# Patient Record
Sex: Female | Born: 1935 | Race: White | Hispanic: No | Smoking: Never smoker
Health system: Southern US, Community
[De-identification: ages and names within clinical notes are randomized; demographics above are authoritative.]

## PROBLEM LIST (undated history)

## (undated) DIAGNOSIS — F419 Anxiety disorder, unspecified: Secondary | ICD-10-CM

## (undated) DIAGNOSIS — K635 Polyp of colon: Secondary | ICD-10-CM

## (undated) DIAGNOSIS — K56609 Unspecified intestinal obstruction, unspecified as to partial versus complete obstruction: Secondary | ICD-10-CM

## (undated) DIAGNOSIS — E119 Type 2 diabetes mellitus without complications: Secondary | ICD-10-CM

## (undated) DIAGNOSIS — I272 Pulmonary hypertension, unspecified: Secondary | ICD-10-CM

## (undated) DIAGNOSIS — I4891 Unspecified atrial fibrillation: Secondary | ICD-10-CM

## (undated) DIAGNOSIS — I10 Essential (primary) hypertension: Secondary | ICD-10-CM

## (undated) DIAGNOSIS — Z7901 Long term (current) use of anticoagulants: Secondary | ICD-10-CM

## (undated) DIAGNOSIS — R2689 Other abnormalities of gait and mobility: Secondary | ICD-10-CM

## (undated) DIAGNOSIS — E039 Hypothyroidism, unspecified: Secondary | ICD-10-CM

## (undated) DIAGNOSIS — M419 Scoliosis, unspecified: Secondary | ICD-10-CM

## (undated) DIAGNOSIS — Z8619 Personal history of other infectious and parasitic diseases: Secondary | ICD-10-CM

## (undated) DIAGNOSIS — R1310 Dysphagia, unspecified: Secondary | ICD-10-CM

## (undated) DIAGNOSIS — K439 Ventral hernia without obstruction or gangrene: Secondary | ICD-10-CM

## (undated) DIAGNOSIS — E538 Deficiency of other specified B group vitamins: Secondary | ICD-10-CM

## (undated) DIAGNOSIS — C569 Malignant neoplasm of unspecified ovary: Secondary | ICD-10-CM

## (undated) DIAGNOSIS — I2510 Atherosclerotic heart disease of native coronary artery without angina pectoris: Secondary | ICD-10-CM

## (undated) DIAGNOSIS — G459 Transient cerebral ischemic attack, unspecified: Secondary | ICD-10-CM

## (undated) DIAGNOSIS — Q394 Esophageal web: Secondary | ICD-10-CM

## (undated) DIAGNOSIS — W19XXXA Unspecified fall, initial encounter: Secondary | ICD-10-CM

## (undated) DIAGNOSIS — K222 Esophageal obstruction: Secondary | ICD-10-CM

## (undated) DIAGNOSIS — I5032 Chronic diastolic (congestive) heart failure: Secondary | ICD-10-CM

## (undated) DIAGNOSIS — I517 Cardiomegaly: Secondary | ICD-10-CM

## (undated) DIAGNOSIS — I4819 Other persistent atrial fibrillation: Secondary | ICD-10-CM

## (undated) DIAGNOSIS — R296 Repeated falls: Secondary | ICD-10-CM

## (undated) DIAGNOSIS — L920 Granuloma annulare: Secondary | ICD-10-CM

## (undated) DIAGNOSIS — I890 Lymphedema, not elsewhere classified: Secondary | ICD-10-CM

## (undated) DIAGNOSIS — E785 Hyperlipidemia, unspecified: Secondary | ICD-10-CM

## (undated) DIAGNOSIS — N200 Calculus of kidney: Secondary | ICD-10-CM

## (undated) DIAGNOSIS — I872 Venous insufficiency (chronic) (peripheral): Secondary | ICD-10-CM

## (undated) DIAGNOSIS — I5030 Unspecified diastolic (congestive) heart failure: Secondary | ICD-10-CM

## (undated) DIAGNOSIS — R159 Full incontinence of feces: Secondary | ICD-10-CM

## (undated) DIAGNOSIS — R413 Other amnesia: Secondary | ICD-10-CM

## (undated) DIAGNOSIS — N390 Urinary tract infection, site not specified: Secondary | ICD-10-CM

## (undated) DIAGNOSIS — T7840XA Allergy, unspecified, initial encounter: Secondary | ICD-10-CM

## (undated) DIAGNOSIS — K580 Irritable bowel syndrome with diarrhea: Secondary | ICD-10-CM

## (undated) DIAGNOSIS — M8580 Other specified disorders of bone density and structure, unspecified site: Secondary | ICD-10-CM

## (undated) DIAGNOSIS — K589 Irritable bowel syndrome without diarrhea: Secondary | ICD-10-CM

## (undated) DIAGNOSIS — M51369 Other intervertebral disc degeneration, lumbar region without mention of lumbar back pain or lower extremity pain: Secondary | ICD-10-CM

## (undated) DIAGNOSIS — I700 Atherosclerosis of aorta: Secondary | ICD-10-CM

## (undated) DIAGNOSIS — G4700 Insomnia, unspecified: Secondary | ICD-10-CM

## (undated) DIAGNOSIS — Z8659 Personal history of other mental and behavioral disorders: Secondary | ICD-10-CM

## (undated) DIAGNOSIS — K219 Gastro-esophageal reflux disease without esophagitis: Secondary | ICD-10-CM

## (undated) HISTORY — PX: JOINT REPLACEMENT: SHX530

## (undated) HISTORY — PX: CARPAL TUNNEL RELEASE: SHX101

## (undated) HISTORY — DX: Irritable bowel syndrome, unspecified: K58.9

## (undated) HISTORY — DX: Chronic diastolic (congestive) heart failure: I50.32

## (undated) HISTORY — DX: Transient cerebral ischemic attack, unspecified: G45.9

## (undated) HISTORY — DX: Gastro-esophageal reflux disease without esophagitis: K21.9

## (undated) HISTORY — DX: Unspecified atrial fibrillation: I48.91

## (undated) HISTORY — DX: Venous insufficiency (chronic) (peripheral): I87.2

## (undated) HISTORY — DX: Granuloma annulare: L92.0

## (undated) HISTORY — PX: APPENDECTOMY: SHX54

## (undated) HISTORY — DX: Polyp of colon: K63.5

## (undated) HISTORY — DX: Essential (primary) hypertension: I10

## (undated) HISTORY — DX: Unspecified intestinal obstruction, unspecified as to partial versus complete obstruction: K56.609

## (undated) HISTORY — DX: Personal history of other mental and behavioral disorders: Z86.59

## (undated) HISTORY — DX: Malignant neoplasm of unspecified ovary: C56.9

## (undated) HISTORY — PX: MOUTH SURGERY: SHX715

## (undated) HISTORY — PX: LUMBAR LAMINECTOMY: SHX95

## (undated) HISTORY — PX: TOTAL SHOULDER ARTHROPLASTY: SHX126

## (undated) HISTORY — DX: Unspecified fall, initial encounter: W19.XXXA

## (undated) HISTORY — DX: Repeated falls: R29.6

## (undated) HISTORY — DX: Anxiety disorder, unspecified: F41.9

## (undated) HISTORY — DX: Personal history of other infectious and parasitic diseases: Z86.19

## (undated) HISTORY — DX: Hyperlipidemia, unspecified: E78.5

## (undated) HISTORY — DX: Atherosclerotic heart disease of native coronary artery without angina pectoris: I25.10

## (undated) HISTORY — DX: Other persistent atrial fibrillation: I48.19

## (undated) HISTORY — PX: OTHER SURGICAL HISTORY: SHX169

## (undated) HISTORY — PX: ABDOMINAL HYSTERECTOMY: SHX81

## (undated) HISTORY — DX: Type 2 diabetes mellitus without complications: E11.9

## (undated) HISTORY — DX: Allergy, unspecified, initial encounter: T78.40XA

## (undated) HISTORY — DX: Hypothyroidism, unspecified: E03.9

## (undated) HISTORY — DX: Urinary tract infection, site not specified: N39.0

## (undated) HISTORY — PX: BACK SURGERY: SHX140

---
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
## 1967-04-25 HISTORY — PX: OTHER SURGICAL HISTORY: SHX169

## 1969-04-24 HISTORY — PX: OTHER SURGICAL HISTORY: SHX169

## 1978-04-24 DIAGNOSIS — C569 Malignant neoplasm of unspecified ovary: Secondary | ICD-10-CM

## 1978-04-24 HISTORY — PX: TOTAL ABDOMINAL HYSTERECTOMY W/ BILATERAL SALPINGOOPHORECTOMY: SHX83

## 1978-04-24 HISTORY — DX: Malignant neoplasm of unspecified ovary: C56.9

## 1980-04-24 HISTORY — PX: OTHER SURGICAL HISTORY: SHX169

## 1984-04-24 HISTORY — PX: CHOLECYSTECTOMY: SHX55

## 1989-04-24 HISTORY — PX: OTHER SURGICAL HISTORY: SHX169

## 1996-04-24 DIAGNOSIS — K56609 Unspecified intestinal obstruction, unspecified as to partial versus complete obstruction: Secondary | ICD-10-CM

## 1996-04-24 HISTORY — DX: Unspecified intestinal obstruction, unspecified as to partial versus complete obstruction: K56.609

## 1997-03-24 HISTORY — PX: OTHER SURGICAL HISTORY: SHX169

## 1998-04-24 HISTORY — PX: OTHER SURGICAL HISTORY: SHX169

## 2000-04-24 HISTORY — PX: OTHER SURGICAL HISTORY: SHX169

## 2003-04-25 DIAGNOSIS — I4819 Other persistent atrial fibrillation: Secondary | ICD-10-CM

## 2003-04-25 HISTORY — DX: Other persistent atrial fibrillation: I48.19

## 2003-11-20 ENCOUNTER — Other Ambulatory Visit: Payer: Self-pay

## 2003-11-21 ENCOUNTER — Other Ambulatory Visit: Payer: Self-pay

## 2003-11-22 ENCOUNTER — Other Ambulatory Visit: Payer: Self-pay

## 2003-11-23 ENCOUNTER — Other Ambulatory Visit: Payer: Self-pay

## 2003-11-23 HISTORY — PX: OTHER SURGICAL HISTORY: SHX169

## 2003-11-23 HISTORY — PX: CARDIAC CATHETERIZATION: SHX172

## 2003-11-24 ENCOUNTER — Other Ambulatory Visit: Payer: Self-pay

## 2003-11-25 ENCOUNTER — Other Ambulatory Visit: Payer: Self-pay

## 2003-11-29 ENCOUNTER — Other Ambulatory Visit: Payer: Self-pay

## 2003-12-10 ENCOUNTER — Other Ambulatory Visit: Payer: Self-pay

## 2003-12-11 ENCOUNTER — Other Ambulatory Visit: Payer: Self-pay

## 2004-01-13 ENCOUNTER — Encounter: Payer: Self-pay | Admitting: Internal Medicine

## 2004-02-10 ENCOUNTER — Encounter: Payer: Self-pay | Admitting: Otolaryngology

## 2004-02-15 ENCOUNTER — Ambulatory Visit: Payer: Self-pay | Admitting: Gynecologic Oncology

## 2004-02-23 ENCOUNTER — Encounter: Payer: Self-pay | Admitting: Otolaryngology

## 2004-02-23 HISTORY — PX: WRIST FRACTURE SURGERY: SHX121

## 2004-03-23 ENCOUNTER — Emergency Department (HOSPITAL_COMMUNITY): Admission: EM | Admit: 2004-03-23 | Discharge: 2004-03-23 | Payer: Self-pay | Admitting: Emergency Medicine

## 2004-03-24 ENCOUNTER — Encounter: Payer: Self-pay | Admitting: Otolaryngology

## 2004-04-24 HISTORY — PX: OTHER SURGICAL HISTORY: SHX169

## 2004-04-24 LAB — HM COLONOSCOPY: HM Colonoscopy: NORMAL

## 2004-05-16 ENCOUNTER — Ambulatory Visit: Payer: Self-pay | Admitting: Gastroenterology

## 2004-06-10 ENCOUNTER — Ambulatory Visit: Payer: Self-pay | Admitting: Gastroenterology

## 2004-06-30 ENCOUNTER — Ambulatory Visit: Payer: Self-pay | Admitting: Gastroenterology

## 2004-07-29 ENCOUNTER — Ambulatory Visit: Payer: Self-pay | Admitting: Internal Medicine

## 2004-08-11 ENCOUNTER — Encounter: Payer: Self-pay | Admitting: Internal Medicine

## 2004-08-31 ENCOUNTER — Ambulatory Visit: Payer: Self-pay | Admitting: Gastroenterology

## 2004-09-02 ENCOUNTER — Ambulatory Visit: Payer: Self-pay | Admitting: Internal Medicine

## 2004-09-29 ENCOUNTER — Ambulatory Visit: Payer: Self-pay | Admitting: Internal Medicine

## 2004-10-03 ENCOUNTER — Ambulatory Visit: Payer: Self-pay | Admitting: Internal Medicine

## 2004-10-10 ENCOUNTER — Ambulatory Visit: Payer: Self-pay | Admitting: Internal Medicine

## 2004-10-24 ENCOUNTER — Ambulatory Visit: Payer: Self-pay | Admitting: Internal Medicine

## 2004-11-08 ENCOUNTER — Ambulatory Visit: Payer: Self-pay | Admitting: Internal Medicine

## 2004-11-22 ENCOUNTER — Ambulatory Visit: Payer: Self-pay | Admitting: Internal Medicine

## 2004-12-02 ENCOUNTER — Ambulatory Visit: Payer: Self-pay | Admitting: Internal Medicine

## 2004-12-20 ENCOUNTER — Ambulatory Visit: Payer: Self-pay | Admitting: Internal Medicine

## 2005-01-17 ENCOUNTER — Ambulatory Visit: Payer: Self-pay | Admitting: Internal Medicine

## 2005-02-14 ENCOUNTER — Ambulatory Visit: Payer: Self-pay | Admitting: Internal Medicine

## 2005-03-14 ENCOUNTER — Ambulatory Visit: Payer: Self-pay | Admitting: Internal Medicine

## 2005-03-24 ENCOUNTER — Ambulatory Visit: Payer: Self-pay | Admitting: Internal Medicine

## 2005-04-07 ENCOUNTER — Ambulatory Visit: Payer: Self-pay | Admitting: Internal Medicine

## 2005-04-10 ENCOUNTER — Ambulatory Visit: Payer: Self-pay | Admitting: Internal Medicine

## 2005-04-11 ENCOUNTER — Ambulatory Visit: Payer: Self-pay | Admitting: Gynecologic Oncology

## 2005-04-20 ENCOUNTER — Ambulatory Visit: Payer: Self-pay | Admitting: Family Medicine

## 2005-04-28 ENCOUNTER — Ambulatory Visit: Payer: Self-pay | Admitting: Family Medicine

## 2005-05-05 ENCOUNTER — Ambulatory Visit: Payer: Self-pay | Admitting: Internal Medicine

## 2005-06-02 ENCOUNTER — Ambulatory Visit: Payer: Self-pay | Admitting: Internal Medicine

## 2005-07-03 ENCOUNTER — Ambulatory Visit: Payer: Self-pay | Admitting: Internal Medicine

## 2005-07-10 ENCOUNTER — Ambulatory Visit: Payer: Self-pay | Admitting: Internal Medicine

## 2005-07-25 ENCOUNTER — Ambulatory Visit: Payer: Self-pay | Admitting: Internal Medicine

## 2005-08-22 HISTORY — PX: OTHER SURGICAL HISTORY: SHX169

## 2005-08-22 HISTORY — PX: CATARACT EXTRACTION: SUR2

## 2005-08-23 ENCOUNTER — Ambulatory Visit: Payer: Self-pay | Admitting: Internal Medicine

## 2005-08-28 ENCOUNTER — Ambulatory Visit: Payer: Self-pay | Admitting: Internal Medicine

## 2005-09-06 ENCOUNTER — Ambulatory Visit: Payer: Self-pay | Admitting: Internal Medicine

## 2005-10-06 ENCOUNTER — Ambulatory Visit: Payer: Self-pay | Admitting: Internal Medicine

## 2005-10-10 ENCOUNTER — Ambulatory Visit: Payer: Self-pay | Admitting: Internal Medicine

## 2005-11-03 ENCOUNTER — Ambulatory Visit: Payer: Self-pay | Admitting: Internal Medicine

## 2005-11-27 ENCOUNTER — Ambulatory Visit: Payer: Self-pay | Admitting: Internal Medicine

## 2005-12-27 ENCOUNTER — Ambulatory Visit: Payer: Self-pay | Admitting: Internal Medicine

## 2006-01-01 ENCOUNTER — Ambulatory Visit: Payer: Self-pay | Admitting: Internal Medicine

## 2006-01-08 ENCOUNTER — Ambulatory Visit: Payer: Self-pay | Admitting: Internal Medicine

## 2006-01-22 ENCOUNTER — Ambulatory Visit: Payer: Self-pay | Admitting: Internal Medicine

## 2006-02-20 ENCOUNTER — Ambulatory Visit: Payer: Self-pay | Admitting: Internal Medicine

## 2006-02-22 ENCOUNTER — Encounter: Payer: Self-pay | Admitting: Internal Medicine

## 2006-02-22 LAB — CONVERTED CEMR LAB: Pap Smear: NORMAL

## 2006-03-08 ENCOUNTER — Ambulatory Visit: Payer: Self-pay | Admitting: Internal Medicine

## 2006-03-21 ENCOUNTER — Ambulatory Visit: Payer: Self-pay | Admitting: Internal Medicine

## 2006-04-04 ENCOUNTER — Ambulatory Visit: Payer: Self-pay | Admitting: Internal Medicine

## 2006-04-11 ENCOUNTER — Ambulatory Visit: Payer: Self-pay | Admitting: Gynecologic Oncology

## 2006-05-03 ENCOUNTER — Ambulatory Visit: Payer: Self-pay | Admitting: Internal Medicine

## 2006-05-31 ENCOUNTER — Ambulatory Visit: Payer: Self-pay | Admitting: Internal Medicine

## 2006-05-31 LAB — CONVERTED CEMR LAB
Creatinine,U: 104.6 mg/dL
Hgb A1c MFr Bld: 7.6 % — ABNORMAL HIGH (ref 4.6–6.0)
Microalb Creat Ratio: 7.6 mg/g (ref 0.0–30.0)
Microalb, Ur: 0.8 mg/dL (ref 0.0–1.9)

## 2006-06-28 ENCOUNTER — Ambulatory Visit: Payer: Self-pay | Admitting: Internal Medicine

## 2006-07-30 ENCOUNTER — Ambulatory Visit: Payer: Self-pay | Admitting: Internal Medicine

## 2006-08-27 ENCOUNTER — Ambulatory Visit: Payer: Self-pay | Admitting: Internal Medicine

## 2006-08-27 LAB — CONVERTED CEMR LAB
INR: 2.6
Prothrombin Time: 19.4 s

## 2006-09-22 ENCOUNTER — Ambulatory Visit: Payer: Self-pay | Admitting: Family Medicine

## 2006-09-22 ENCOUNTER — Encounter: Payer: Self-pay | Admitting: Internal Medicine

## 2006-09-25 ENCOUNTER — Ambulatory Visit: Payer: Self-pay | Admitting: Internal Medicine

## 2006-09-25 LAB — CONVERTED CEMR LAB
INR: 2.7
Prothrombin Time: 20.1 s

## 2006-10-30 ENCOUNTER — Ambulatory Visit: Payer: Self-pay | Admitting: Internal Medicine

## 2006-10-30 LAB — CONVERTED CEMR LAB
INR: 2.2
Prothrombin Time: 18.2 s

## 2006-11-20 ENCOUNTER — Encounter: Payer: Self-pay | Admitting: Internal Medicine

## 2006-11-20 DIAGNOSIS — I4891 Unspecified atrial fibrillation: Secondary | ICD-10-CM

## 2006-11-20 DIAGNOSIS — I4821 Permanent atrial fibrillation: Secondary | ICD-10-CM | POA: Insufficient documentation

## 2006-11-20 DIAGNOSIS — E039 Hypothyroidism, unspecified: Secondary | ICD-10-CM | POA: Insufficient documentation

## 2006-11-20 DIAGNOSIS — M810 Age-related osteoporosis without current pathological fracture: Secondary | ICD-10-CM | POA: Insufficient documentation

## 2006-11-20 DIAGNOSIS — E1149 Type 2 diabetes mellitus with other diabetic neurological complication: Secondary | ICD-10-CM | POA: Insufficient documentation

## 2006-11-20 DIAGNOSIS — I10 Essential (primary) hypertension: Secondary | ICD-10-CM | POA: Insufficient documentation

## 2006-11-20 DIAGNOSIS — C569 Malignant neoplasm of unspecified ovary: Secondary | ICD-10-CM | POA: Insufficient documentation

## 2006-11-20 DIAGNOSIS — E785 Hyperlipidemia, unspecified: Secondary | ICD-10-CM | POA: Insufficient documentation

## 2006-11-20 DIAGNOSIS — E1169 Type 2 diabetes mellitus with other specified complication: Secondary | ICD-10-CM | POA: Insufficient documentation

## 2006-11-20 DIAGNOSIS — F411 Generalized anxiety disorder: Secondary | ICD-10-CM | POA: Insufficient documentation

## 2006-12-03 ENCOUNTER — Ambulatory Visit: Payer: Self-pay | Admitting: Internal Medicine

## 2006-12-03 LAB — CONVERTED CEMR LAB
INR: 1.8
Prothrombin Time: 16.5 s

## 2006-12-05 ENCOUNTER — Encounter (INDEPENDENT_AMBULATORY_CARE_PROVIDER_SITE_OTHER): Payer: Self-pay | Admitting: *Deleted

## 2006-12-05 LAB — CONVERTED CEMR LAB
ALT: 16 units/L (ref 0–35)
Albumin: 3.5 g/dL (ref 3.5–5.2)
BUN: 17 mg/dL (ref 6–23)
CO2: 31 meq/L (ref 19–32)
Calcium: 8.8 mg/dL (ref 8.4–10.5)
Chloride: 99 meq/L (ref 96–112)
Cholesterol: 202 mg/dL (ref 0–200)
Creatinine, Ser: 0.7 mg/dL (ref 0.4–1.2)
Direct LDL: 134.1 mg/dL
Free T4: 0.9 ng/dL (ref 0.6–1.6)
GFR calc Af Amer: 104 mL/min
GFR calc non Af Amer: 86 mL/min
Glucose, Bld: 128 mg/dL — ABNORMAL HIGH (ref 70–99)
HDL: 41.4 mg/dL (ref >39.0)
Hgb A1c MFr Bld: 7.1 % — ABNORMAL HIGH (ref 4.6–6.0)
Phosphorus: 4 mg/dL (ref 2.3–4.6)
Potassium: 4.1 meq/L (ref 3.5–5.1)
Sodium: 139 meq/L (ref 135–145)
TSH: 0.93 microintl units/mL (ref 0.35–5.50)
Total CHOL/HDL Ratio: 4.9
Triglycerides: 148 mg/dL (ref 0–149)
VLDL: 30 mg/dL (ref 0–40)

## 2007-01-15 ENCOUNTER — Ambulatory Visit: Payer: Self-pay | Admitting: Internal Medicine

## 2007-01-15 LAB — CONVERTED CEMR LAB
INR: 2.7
Prothrombin Time: 20.1 s

## 2007-02-12 ENCOUNTER — Ambulatory Visit: Payer: Self-pay | Admitting: Internal Medicine

## 2007-02-12 LAB — CONVERTED CEMR LAB
INR: 2
Prothrombin Time: 17.2 s

## 2007-03-12 ENCOUNTER — Ambulatory Visit: Payer: Self-pay | Admitting: Internal Medicine

## 2007-03-12 LAB — CONVERTED CEMR LAB
INR: 1.8
Prothrombin Time: 16.5 s

## 2007-03-15 ENCOUNTER — Ambulatory Visit: Payer: Self-pay | Admitting: Internal Medicine

## 2007-03-15 DIAGNOSIS — K439 Ventral hernia without obstruction or gangrene: Secondary | ICD-10-CM | POA: Insufficient documentation

## 2007-04-10 ENCOUNTER — Ambulatory Visit: Payer: Self-pay | Admitting: Internal Medicine

## 2007-04-10 LAB — CONVERTED CEMR LAB
INR: 2.9
Prothrombin Time: 20.5 s

## 2007-05-08 ENCOUNTER — Ambulatory Visit: Payer: Self-pay | Admitting: Internal Medicine

## 2007-05-08 LAB — CONVERTED CEMR LAB
INR: 2.3
Prothrombin Time: 25.6 s

## 2007-05-09 ENCOUNTER — Encounter: Payer: Self-pay | Admitting: Internal Medicine

## 2007-05-10 LAB — CONVERTED CEMR LAB
INR: 2.3 — ABNORMAL HIGH (ref 0.0–1.5)
Prothrombin Time: 25.6 s — ABNORMAL HIGH (ref 11.6–15.2)

## 2007-05-31 ENCOUNTER — Ambulatory Visit: Payer: Self-pay | Admitting: Internal Medicine

## 2007-05-31 DIAGNOSIS — K5909 Other constipation: Secondary | ICD-10-CM | POA: Insufficient documentation

## 2007-06-04 LAB — CONVERTED CEMR LAB
ALT: 16 units/L (ref 0–35)
Albumin: 3.5 g/dL (ref 3.5–5.2)
BUN: 14 mg/dL (ref 6–23)
Basophils Absolute: 0 10*3/uL (ref 0.0–0.1)
Basophils Relative: 0.1 % (ref 0.0–1.0)
CO2: 30 meq/L (ref 19–32)
Calcium: 9.1 mg/dL (ref 8.4–10.5)
Chloride: 101 meq/L (ref 96–112)
Cholesterol: 208 mg/dL (ref 0–200)
Creatinine, Ser: 0.7 mg/dL (ref 0.4–1.2)
Creatinine,U: 50.9 mg/dL
Direct LDL: 136.2 mg/dL
Eosinophils Absolute: 0.2 10*3/uL (ref 0.0–0.6)
Eosinophils Relative: 3.8 % (ref 0.0–5.0)
GFR calc Af Amer: 104 mL/min
GFR calc non Af Amer: 86 mL/min
Glucose, Bld: 161 mg/dL — ABNORMAL HIGH (ref 70–99)
HCT: 41.9 % (ref 36.0–46.0)
HDL: 47.6 mg/dL (ref >39.0)
Hemoglobin: 14.3 g/dL (ref 12.0–15.0)
Hgb A1c MFr Bld: 8.3 % — ABNORMAL HIGH (ref 4.6–6.0)
Lymphocytes Relative: 26.6 % (ref 12.0–46.0)
MCHC: 34.1 g/dL (ref 30.0–36.0)
MCV: 97.5 fL (ref 78.0–100.0)
Microalb Creat Ratio: 13.8 mg/g (ref 0.0–30.0)
Microalb, Ur: 0.7 mg/dL (ref 0.0–1.9)
Monocytes Absolute: 0.4 10*3/uL (ref 0.2–0.7)
Monocytes Relative: 6.5 % (ref 3.0–11.0)
Neutro Abs: 3.7 10*3/uL (ref 1.4–7.7)
Neutrophils Relative %: 63 % (ref 43.0–77.0)
Phosphorus: 3.7 mg/dL (ref 2.3–4.6)
Platelets: 146 10*3/uL — ABNORMAL LOW (ref 150–400)
Potassium: 4.8 meq/L (ref 3.5–5.1)
RBC: 4.3 M/uL (ref 3.87–5.11)
RDW: 12.4 % (ref 11.5–14.6)
Sodium: 139 meq/L (ref 135–145)
TSH: 1.6 microintl units/mL (ref 0.35–5.50)
Total CHOL/HDL Ratio: 4.4
Triglycerides: 164 mg/dL — ABNORMAL HIGH (ref 0–149)
VLDL: 33 mg/dL (ref 0–40)
WBC: 5.9 10*3/uL (ref 4.5–10.5)

## 2007-06-28 ENCOUNTER — Ambulatory Visit: Payer: Self-pay | Admitting: Internal Medicine

## 2007-06-28 LAB — CONVERTED CEMR LAB
INR: 3.8
Prothrombin Time: 23.7 s

## 2007-07-12 ENCOUNTER — Ambulatory Visit: Payer: Self-pay | Admitting: Internal Medicine

## 2007-07-12 LAB — CONVERTED CEMR LAB
INR: 1.9
Prothrombin Time: 16.8 s

## 2007-07-15 ENCOUNTER — Ambulatory Visit: Payer: Self-pay | Admitting: Gynecologic Oncology

## 2007-07-15 ENCOUNTER — Encounter: Payer: Self-pay | Admitting: Internal Medicine

## 2007-07-22 ENCOUNTER — Ambulatory Visit: Payer: Self-pay | Admitting: Internal Medicine

## 2007-07-22 LAB — CONVERTED CEMR LAB
INR: 1.5
Prothrombin Time: 15.3 s

## 2007-08-05 ENCOUNTER — Encounter: Payer: Self-pay | Admitting: Internal Medicine

## 2007-08-06 ENCOUNTER — Ambulatory Visit: Payer: Self-pay | Admitting: Internal Medicine

## 2007-08-06 LAB — CONVERTED CEMR LAB
INR: 4.9
Prothrombin Time: 27 s

## 2007-08-12 ENCOUNTER — Encounter (INDEPENDENT_AMBULATORY_CARE_PROVIDER_SITE_OTHER): Payer: Self-pay | Admitting: *Deleted

## 2007-08-19 ENCOUNTER — Ambulatory Visit: Payer: Self-pay | Admitting: Internal Medicine

## 2007-08-20 LAB — CONVERTED CEMR LAB
INR: 2.2 — ABNORMAL HIGH (ref 0.8–1.0)
Prothrombin Time: 23.6 s — ABNORMAL HIGH (ref 10.9–13.3)

## 2007-09-04 ENCOUNTER — Telehealth (INDEPENDENT_AMBULATORY_CARE_PROVIDER_SITE_OTHER): Payer: Self-pay | Admitting: *Deleted

## 2007-09-04 ENCOUNTER — Ambulatory Visit: Payer: Self-pay | Admitting: Internal Medicine

## 2007-09-04 LAB — CONVERTED CEMR LAB
INR: 3.3
Prothrombin Time: 21.9 s

## 2007-09-18 ENCOUNTER — Ambulatory Visit: Payer: Self-pay | Admitting: Internal Medicine

## 2007-09-18 LAB — CONVERTED CEMR LAB
INR: 3
Prothrombin Time: 21 s

## 2007-10-21 ENCOUNTER — Ambulatory Visit: Payer: Self-pay | Admitting: Internal Medicine

## 2007-10-21 LAB — CONVERTED CEMR LAB
INR: 4.1
Prothrombin Time: 24.6 s

## 2007-11-05 ENCOUNTER — Ambulatory Visit: Payer: Self-pay | Admitting: Internal Medicine

## 2007-11-05 LAB — CONVERTED CEMR LAB
INR: 1.7
Prothrombin Time: 16.2 s

## 2007-11-19 ENCOUNTER — Ambulatory Visit: Payer: Self-pay | Admitting: Internal Medicine

## 2007-11-19 LAB — CONVERTED CEMR LAB
INR: 2.2
Prothrombin Time: 18 s

## 2007-12-18 ENCOUNTER — Ambulatory Visit: Payer: Self-pay | Admitting: Internal Medicine

## 2007-12-18 LAB — CONVERTED CEMR LAB
INR: 2.8
Prothrombin Time: 20.3 s

## 2008-01-16 ENCOUNTER — Ambulatory Visit: Payer: Self-pay | Admitting: Internal Medicine

## 2008-01-16 LAB — CONVERTED CEMR LAB
INR: 2.2
Prothrombin Time: 18 s

## 2008-01-27 ENCOUNTER — Telehealth: Payer: Self-pay | Admitting: Internal Medicine

## 2008-02-05 ENCOUNTER — Ambulatory Visit: Payer: Self-pay | Admitting: Internal Medicine

## 2008-02-06 ENCOUNTER — Encounter: Payer: Self-pay | Admitting: Internal Medicine

## 2008-02-10 LAB — CONVERTED CEMR LAB
Albumin: 3.5 g/dL (ref 3.5–5.2)
BUN: 21 mg/dL (ref 6–23)
Basophils Absolute: 0 10*3/uL (ref 0.0–0.1)
Basophils Relative: 0.2 % (ref 0.0–3.0)
CO2: 32 meq/L (ref 19–32)
Calcium: 9.2 mg/dL (ref 8.4–10.5)
Chloride: 99 meq/L (ref 96–112)
Creatinine, Ser: 0.7 mg/dL (ref 0.4–1.2)
Eosinophils Absolute: 0.1 10*3/uL (ref 0.0–0.7)
Eosinophils Relative: 1.3 % (ref 0.0–5.0)
Free T4: 0.9 ng/dL (ref 0.6–1.6)
GFR calc Af Amer: 104 mL/min
GFR calc non Af Amer: 86 mL/min
Glucose, Bld: 221 mg/dL — ABNORMAL HIGH (ref 70–99)
HCT: 42.1 % (ref 36.0–46.0)
Hemoglobin: 15 g/dL (ref 12.0–15.0)
Hgb A1c MFr Bld: 9.8 % — ABNORMAL HIGH (ref 4.6–6.0)
Lymphocytes Relative: 30.9 % (ref 12.0–46.0)
MCHC: 35.5 g/dL (ref 30.0–36.0)
MCV: 97.8 fL (ref 78.0–100.0)
Monocytes Absolute: 0.4 10*3/uL (ref 0.1–1.0)
Monocytes Relative: 7.3 % (ref 3.0–12.0)
Neutro Abs: 3.7 10*3/uL (ref 1.4–7.7)
Neutrophils Relative %: 60.3 % (ref 43.0–77.0)
Phosphorus: 2.6 mg/dL (ref 2.3–4.6)
Platelets: 154 10*3/uL (ref 150–400)
Potassium: 4.8 meq/L (ref 3.5–5.1)
RBC: 4.31 M/uL (ref 3.87–5.11)
RDW: 12.5 % (ref 11.5–14.6)
Sodium: 138 meq/L (ref 135–145)
TSH: 1.74 microintl units/mL (ref 0.35–5.50)
WBC: 6.1 10*3/uL (ref 4.5–10.5)

## 2008-02-26 ENCOUNTER — Encounter: Payer: Self-pay | Admitting: Internal Medicine

## 2008-02-28 ENCOUNTER — Encounter: Payer: Self-pay | Admitting: Internal Medicine

## 2008-02-28 ENCOUNTER — Telehealth: Payer: Self-pay | Admitting: Internal Medicine

## 2008-03-06 ENCOUNTER — Ambulatory Visit: Payer: Self-pay | Admitting: Internal Medicine

## 2008-03-06 LAB — CONVERTED CEMR LAB
INR: 2.2
Prothrombin Time: 18.2 s

## 2008-04-03 ENCOUNTER — Ambulatory Visit: Payer: Self-pay | Admitting: Internal Medicine

## 2008-04-03 LAB — CONVERTED CEMR LAB
INR: 1.7
Prothrombin Time: 16.3 s

## 2008-04-07 ENCOUNTER — Ambulatory Visit: Payer: Self-pay | Admitting: Internal Medicine

## 2008-04-08 LAB — CONVERTED CEMR LAB: Hgb A1c MFr Bld: 8.4 % — ABNORMAL HIGH (ref 4.6–6.0)

## 2008-04-15 ENCOUNTER — Ambulatory Visit: Payer: Self-pay | Admitting: Internal Medicine

## 2008-04-15 LAB — CONVERTED CEMR LAB
INR: 1.7
Prothrombin Time: 15.9 s

## 2008-04-24 DIAGNOSIS — L920 Granuloma annulare: Secondary | ICD-10-CM

## 2008-04-24 HISTORY — DX: Granuloma annulare: L92.0

## 2008-04-29 ENCOUNTER — Ambulatory Visit: Payer: Self-pay | Admitting: Internal Medicine

## 2008-04-29 LAB — CONVERTED CEMR LAB
INR: 1.7
Prothrombin Time: 16.3 s

## 2008-05-14 ENCOUNTER — Ambulatory Visit: Payer: Self-pay | Admitting: Internal Medicine

## 2008-05-14 LAB — CONVERTED CEMR LAB
INR: 2.4
Prothrombin Time: 19 s

## 2008-06-12 ENCOUNTER — Ambulatory Visit: Payer: Self-pay | Admitting: Internal Medicine

## 2008-06-12 LAB — CONVERTED CEMR LAB
INR: 3.5
Prothrombin Time: 22.6 s

## 2008-06-26 ENCOUNTER — Ambulatory Visit: Payer: Self-pay | Admitting: Internal Medicine

## 2008-06-26 LAB — CONVERTED CEMR LAB
INR: 2.3
Prothrombin Time: 18.5 s

## 2008-07-14 ENCOUNTER — Encounter: Payer: Self-pay | Admitting: Internal Medicine

## 2008-07-16 ENCOUNTER — Ambulatory Visit: Payer: Self-pay | Admitting: Gynecologic Oncology

## 2008-07-30 ENCOUNTER — Telehealth: Payer: Self-pay | Admitting: Internal Medicine

## 2008-07-30 ENCOUNTER — Ambulatory Visit: Payer: Self-pay | Admitting: Internal Medicine

## 2008-07-30 LAB — CONVERTED CEMR LAB
INR: 2.6
Prothrombin Time: 19.5 s

## 2008-08-27 ENCOUNTER — Ambulatory Visit: Payer: Self-pay | Admitting: Internal Medicine

## 2008-08-27 LAB — CONVERTED CEMR LAB
INR: 2.9
Prothrombin Time: 20.7 s

## 2008-09-04 ENCOUNTER — Ambulatory Visit: Payer: Self-pay | Admitting: Internal Medicine

## 2008-09-04 DIAGNOSIS — J019 Acute sinusitis, unspecified: Secondary | ICD-10-CM | POA: Insufficient documentation

## 2008-09-24 ENCOUNTER — Ambulatory Visit: Payer: Self-pay | Admitting: Internal Medicine

## 2008-09-24 LAB — CONVERTED CEMR LAB
INR: 1.7
Prothrombin Time: 16.1 s

## 2008-10-13 ENCOUNTER — Ambulatory Visit: Payer: Self-pay | Admitting: Internal Medicine

## 2008-10-13 LAB — CONVERTED CEMR LAB
INR: 2
Prothrombin Time: 17.3 s

## 2008-11-10 ENCOUNTER — Ambulatory Visit: Payer: Self-pay | Admitting: Internal Medicine

## 2008-11-10 LAB — CONVERTED CEMR LAB
INR: 1.8
Prothrombin Time: 16.4 s

## 2008-11-27 ENCOUNTER — Ambulatory Visit: Payer: Self-pay | Admitting: Internal Medicine

## 2008-11-27 LAB — CONVERTED CEMR LAB
INR: 1.3
Prothrombin Time: 13.9 s

## 2008-12-01 LAB — CONVERTED CEMR LAB
ALT: 14 units/L (ref 0–35)
AST: 28 units/L (ref 0–37)
Albumin: 3.6 g/dL (ref 3.5–5.2)
Alkaline Phosphatase: 49 units/L (ref 39–117)
BUN: 19 mg/dL (ref 6–23)
Basophils Absolute: 0.1 10*3/uL (ref 0.0–0.1)
Basophils Relative: 1 % (ref 0.0–3.0)
Bilirubin, Direct: 0 mg/dL (ref 0.0–0.3)
CO2: 32 meq/L (ref 19–32)
Calcium: 9.2 mg/dL (ref 8.4–10.5)
Chloride: 105 meq/L (ref 96–112)
Cholesterol: 202 mg/dL — ABNORMAL HIGH (ref 0–200)
Creatinine, Ser: 0.6 mg/dL (ref 0.4–1.2)
Digitoxin Lvl: 0.3 ng/mL — ABNORMAL LOW (ref 0.8–2.0)
Direct LDL: 130.3 mg/dL
Eosinophils Absolute: 0.1 10*3/uL (ref 0.0–0.7)
Eosinophils Relative: 1.5 % (ref 0.0–5.0)
Free T4: 1.1 ng/dL (ref 0.6–1.6)
Glucose, Bld: 130 mg/dL — ABNORMAL HIGH (ref 70–99)
HCT: 39.4 % (ref 36.0–46.0)
HDL: 43.3 mg/dL (ref >39.00)
Hemoglobin: 13.8 g/dL (ref 12.0–15.0)
Hgb A1c MFr Bld: 7.5 % — ABNORMAL HIGH (ref 4.6–6.5)
Lymphocytes Relative: 30.7 % (ref 12.0–46.0)
Lymphs Abs: 1.6 10*3/uL (ref 0.7–4.0)
MCHC: 35.1 g/dL (ref 30.0–36.0)
MCV: 98.7 fL (ref 78.0–100.0)
Monocytes Absolute: 0.4 10*3/uL (ref 0.1–1.0)
Monocytes Relative: 7.6 % (ref 3.0–12.0)
Neutro Abs: 2.9 10*3/uL (ref 1.4–7.7)
Neutrophils Relative %: 59.2 % (ref 43.0–77.0)
Phosphorus: 3.5 mg/dL (ref 2.3–4.6)
Platelets: 129 10*3/uL — ABNORMAL LOW (ref 150.0–400.0)
Potassium: 4.4 meq/L (ref 3.5–5.1)
RBC: 3.99 M/uL (ref 3.87–5.11)
RDW: 12.7 % (ref 11.5–14.6)
Sodium: 142 meq/L (ref 135–145)
TSH: 1.17 microintl units/mL (ref 0.35–5.50)
Total Bilirubin: 1.1 mg/dL (ref 0.3–1.2)
Total CHOL/HDL Ratio: 5
Total Protein: 6.9 g/dL (ref 6.0–8.3)
Triglycerides: 213 mg/dL — ABNORMAL HIGH (ref 0.0–149.0)
VLDL: 42.6 mg/dL — ABNORMAL HIGH (ref 0.0–40.0)
WBC: 5.1 10*3/uL (ref 4.5–10.5)

## 2008-12-11 ENCOUNTER — Ambulatory Visit: Payer: Self-pay | Admitting: Internal Medicine

## 2008-12-11 LAB — CONVERTED CEMR LAB
INR: 4.5
Prothrombin Time: 25.9 s

## 2008-12-18 ENCOUNTER — Ambulatory Visit: Payer: Self-pay | Admitting: Internal Medicine

## 2008-12-18 LAB — CONVERTED CEMR LAB: Prothrombin Time: 19.6 s

## 2008-12-25 ENCOUNTER — Ambulatory Visit: Payer: Self-pay | Admitting: Internal Medicine

## 2008-12-25 LAB — CONVERTED CEMR LAB
INR: 2.7
Prothrombin Time: 19.9 s

## 2009-01-29 ENCOUNTER — Ambulatory Visit: Payer: Self-pay | Admitting: Internal Medicine

## 2009-01-29 LAB — CONVERTED CEMR LAB
INR: 3.2
Prothrombin Time: 21.6 s

## 2009-02-26 ENCOUNTER — Ambulatory Visit: Payer: Self-pay | Admitting: Internal Medicine

## 2009-02-26 LAB — CONVERTED CEMR LAB
INR: 2.5
Prothrombin Time: 19.3 s

## 2009-03-05 ENCOUNTER — Encounter: Payer: Self-pay | Admitting: Internal Medicine

## 2009-03-26 ENCOUNTER — Ambulatory Visit: Payer: Self-pay | Admitting: Internal Medicine

## 2009-03-26 LAB — CONVERTED CEMR LAB
INR: 2.1
Prothrombin Time: 17.9 s

## 2009-04-30 ENCOUNTER — Ambulatory Visit: Payer: Self-pay | Admitting: Internal Medicine

## 2009-04-30 LAB — CONVERTED CEMR LAB: Prothrombin Time: 18.8 s

## 2009-05-21 ENCOUNTER — Ambulatory Visit: Payer: Self-pay | Admitting: Internal Medicine

## 2009-05-24 LAB — CONVERTED CEMR LAB: Hgb A1c MFr Bld: 7.3 % — ABNORMAL HIGH (ref 4.6–6.1)

## 2009-06-22 ENCOUNTER — Ambulatory Visit: Payer: Self-pay | Admitting: Internal Medicine

## 2009-06-22 LAB — CONVERTED CEMR LAB: INR: 2.3

## 2009-07-21 ENCOUNTER — Encounter: Payer: Self-pay | Admitting: Internal Medicine

## 2009-07-22 ENCOUNTER — Ambulatory Visit: Payer: Self-pay | Admitting: Internal Medicine

## 2009-08-19 ENCOUNTER — Ambulatory Visit: Payer: Self-pay | Admitting: Internal Medicine

## 2009-09-10 ENCOUNTER — Encounter: Payer: Self-pay | Admitting: Internal Medicine

## 2009-09-14 ENCOUNTER — Encounter: Payer: Self-pay | Admitting: Internal Medicine

## 2009-09-14 ENCOUNTER — Ambulatory Visit: Payer: Self-pay | Admitting: Gynecologic Oncology

## 2009-09-16 ENCOUNTER — Ambulatory Visit: Payer: Self-pay | Admitting: Internal Medicine

## 2009-09-16 LAB — CONVERTED CEMR LAB: Prothrombin Time: 31.1 s

## 2009-09-22 LAB — CONVERTED CEMR LAB
Albumin: 4 g/dL (ref 3.5–5.2)
Alkaline Phosphatase: 50 units/L (ref 39–117)
BUN: 24 mg/dL — ABNORMAL HIGH (ref 6–23)
Basophils Absolute: 0 10*3/uL (ref 0.0–0.1)
Basophils Relative: 0.4 % (ref 0.0–3.0)
Calcium: 9.6 mg/dL (ref 8.4–10.5)
Chloride: 102 meq/L (ref 96–112)
Eosinophils Absolute: 0.1 10*3/uL (ref 0.0–0.7)
Free T4: 1.1 ng/dL (ref 0.6–1.6)
HCT: 41.2 % (ref 36.0–46.0)
Hemoglobin: 14.2 g/dL (ref 12.0–15.0)
Hgb A1c MFr Bld: 7.5 % — ABNORMAL HIGH (ref 4.6–6.5)
Lymphs Abs: 2 10*3/uL (ref 0.7–4.0)
MCHC: 34.6 g/dL (ref 30.0–36.0)
MCV: 99.2 fL (ref 78.0–100.0)
Monocytes Absolute: 0.4 10*3/uL (ref 0.1–1.0)
Neutro Abs: 4 10*3/uL (ref 1.4–7.7)
Potassium: 4.6 meq/L (ref 3.5–5.1)
RBC: 4.15 M/uL (ref 3.87–5.11)
RDW: 13.9 % (ref 11.5–14.6)
TSH: 1.16 microintl units/mL (ref 0.35–5.50)
Total Protein: 7.1 g/dL (ref 6.0–8.3)

## 2009-09-29 ENCOUNTER — Telehealth: Payer: Self-pay | Admitting: Internal Medicine

## 2009-10-15 ENCOUNTER — Ambulatory Visit: Payer: Self-pay | Admitting: Internal Medicine

## 2009-10-15 LAB — CONVERTED CEMR LAB: Prothrombin Time: 29.9 s

## 2009-10-18 LAB — CONVERTED CEMR LAB
Digitoxin Lvl: 0.6 ng/mL — ABNORMAL LOW (ref 0.8–2.0)
HDL: 47.2 mg/dL (ref >39.00)
Total CHOL/HDL Ratio: 4

## 2009-10-29 ENCOUNTER — Telehealth: Payer: Self-pay | Admitting: Internal Medicine

## 2009-11-12 ENCOUNTER — Ambulatory Visit: Payer: Self-pay | Admitting: Internal Medicine

## 2009-11-12 LAB — CONVERTED CEMR LAB: INR: 2.2

## 2009-12-10 ENCOUNTER — Ambulatory Visit: Payer: Self-pay | Admitting: Internal Medicine

## 2009-12-10 LAB — CONVERTED CEMR LAB: Prothrombin Time: 24.4 s

## 2010-01-10 ENCOUNTER — Ambulatory Visit: Payer: Self-pay | Admitting: Internal Medicine

## 2010-01-10 LAB — CONVERTED CEMR LAB
INR: 1.9
Prothrombin Time: 23.1 s

## 2010-02-01 ENCOUNTER — Telehealth: Payer: Self-pay | Admitting: Internal Medicine

## 2010-02-07 ENCOUNTER — Ambulatory Visit: Payer: Self-pay | Admitting: Internal Medicine

## 2010-02-07 LAB — CONVERTED CEMR LAB: INR: 2.8

## 2010-02-28 ENCOUNTER — Telehealth: Payer: Self-pay | Admitting: Internal Medicine

## 2010-03-04 ENCOUNTER — Encounter: Payer: Self-pay | Admitting: Internal Medicine

## 2010-03-07 ENCOUNTER — Ambulatory Visit: Payer: Self-pay | Admitting: Internal Medicine

## 2010-03-07 LAB — CONVERTED CEMR LAB: Prothrombin Time: 26.8 s

## 2010-03-16 ENCOUNTER — Encounter: Payer: Self-pay | Admitting: Internal Medicine

## 2010-03-24 ENCOUNTER — Encounter: Payer: Self-pay | Admitting: Internal Medicine

## 2010-04-04 ENCOUNTER — Ambulatory Visit: Payer: Self-pay | Admitting: Internal Medicine

## 2010-04-12 ENCOUNTER — Encounter: Payer: Self-pay | Admitting: Internal Medicine

## 2010-05-06 LAB — HM DIABETES EYE EXAM

## 2010-05-10 ENCOUNTER — Other Ambulatory Visit: Payer: Self-pay | Admitting: Internal Medicine

## 2010-05-10 ENCOUNTER — Ambulatory Visit: Admit: 2010-05-10 | Payer: Self-pay | Admitting: Internal Medicine

## 2010-05-10 ENCOUNTER — Ambulatory Visit
Admission: RE | Admit: 2010-05-10 | Discharge: 2010-05-10 | Payer: Self-pay | Source: Home / Self Care | Attending: Internal Medicine | Admitting: Internal Medicine

## 2010-05-10 ENCOUNTER — Encounter: Payer: Self-pay | Admitting: Internal Medicine

## 2010-05-10 LAB — CBC WITH DIFFERENTIAL/PLATELET
Basophils Absolute: 0 10*3/uL (ref 0.0–0.1)
Basophils Relative: 0.5 % (ref 0.0–3.0)
Eosinophils Absolute: 0.1 10*3/uL (ref 0.0–0.7)
Eosinophils Relative: 1.5 % (ref 0.0–5.0)
HCT: 43.3 % (ref 36.0–46.0)
Hemoglobin: 15.1 g/dL — ABNORMAL HIGH (ref 12.0–15.0)
Lymphocytes Relative: 30.9 % (ref 12.0–46.0)
Lymphs Abs: 1.8 10*3/uL (ref 0.7–4.0)
MCHC: 34.8 g/dL (ref 30.0–36.0)
MCV: 98.2 fl (ref 78.0–100.0)
Monocytes Absolute: 0.5 10*3/uL (ref 0.1–1.0)
Monocytes Relative: 8.8 % (ref 3.0–12.0)
Neutro Abs: 3.5 10*3/uL (ref 1.4–7.7)
Neutrophils Relative %: 58.3 % (ref 43.0–77.0)
Platelets: 154 10*3/uL (ref 150.0–400.0)
RBC: 4.41 Mil/uL (ref 3.87–5.11)
RDW: 13.7 % (ref 11.5–14.6)
WBC: 6 10*3/uL (ref 4.5–10.5)

## 2010-05-10 LAB — RENAL FUNCTION PANEL
Albumin: 4 g/dL (ref 3.5–5.2)
BUN: 23 mg/dL (ref 6–23)
CO2: 28 mEq/L (ref 19–32)
Calcium: 9.1 mg/dL (ref 8.4–10.5)
Chloride: 99 mEq/L (ref 96–112)
Creatinine, Ser: 0.8 mg/dL (ref 0.4–1.2)
GFR: 69.2 mL/min (ref >60.00)
Glucose, Bld: 189 mg/dL — ABNORMAL HIGH (ref 70–99)
Phosphorus: 4.2 mg/dL (ref 2.3–4.6)
Potassium: 4.8 mEq/L (ref 3.5–5.1)
Sodium: 137 mEq/L (ref 135–145)

## 2010-05-10 LAB — MICROALBUMIN / CREATININE URINE RATIO
Creatinine,U: 205.5 mg/dL
Microalb Creat Ratio: 2.8 mg/g (ref 0.0–30.0)
Microalb, Ur: 5.7 mg/dL — ABNORMAL HIGH (ref 0.0–1.9)

## 2010-05-10 LAB — HEPATIC FUNCTION PANEL
ALT: 11 U/L (ref 0–35)
AST: 26 U/L (ref 0–37)
Albumin: 4 g/dL (ref 3.5–5.2)
Alkaline Phosphatase: 63 U/L (ref 39–117)
Bilirubin, Direct: 0.1 mg/dL (ref 0.0–0.3)
Total Bilirubin: 0.9 mg/dL (ref 0.3–1.2)
Total Protein: 7.3 g/dL (ref 6.0–8.3)

## 2010-05-10 LAB — TSH: TSH: 1.65 u[IU]/mL (ref 0.35–5.50)

## 2010-05-10 LAB — HEMOGLOBIN A1C: Hgb A1c MFr Bld: 8.3 % — ABNORMAL HIGH (ref 4.6–6.5)

## 2010-05-10 LAB — HM DIABETES FOOT EXAM

## 2010-05-10 LAB — T4, FREE: Free T4: 1.11 ng/dL (ref 0.60–1.60)

## 2010-05-24 NOTE — Letter (Signed)
Summary: [facility] Care-Gynecology Oncology  [facility] Care-Gynecology Oncology   Imported By: Maryln Gottron 03/18/2010 13:45:12  _____________________________________________________________________  External Attachment:    Type:   Image     Comment:   External Document  Appended Document: [facility] Care-Gynecology Oncology follow up ovarian cancer rechecking tumor markers setting up GI -- screening colon and may need EGD due to some swallowing problems

## 2010-05-24 NOTE — Assessment & Plan Note (Signed)
Summary: follow up/ alc  R/S FROM 09/17/09   Vital Signs:  Patient profile:   77 year old female Weight:      180 pounds BMI:     31.25 Temp:     98.2 degrees F oral Pulse rate:   80 / minute Pulse rhythm:   regular BP sitting:   112 / 70  (left arm) Cuff size:   regular  Vitals Entered By: Mervin Hack CMA Duncan Dull) (Sep 16, 2009 3:00 PM) CC: follow-up visit   History of Present Illness: DOing well  having some trouble controlling blood sugar at times 140-170 fasting in AM Occ gets some jitters and can be down to 100  Notices some problems keeping balance--just occ has to work hard at times No fall Puts hand down and she is fine Notices it more in gym Notices some orthostatic component Does note change in sensation on plantar feet. Some numbness  No chest pain No SOB Occ mild edema  Allergies: 1)  ! Penicillin V Potassium (Penicillin V Potassium) 2)  ! Codeine 3)  ! Lovastatin (Lovastatin) 4)  ! Glucophage (Metformin Hcl) 5)  ! Avandia (Rosiglitazone Maleate) 6)  ! Fosamax (Alendronate Sodium) 7)  ! Lipitor (Atorvastatin Calcium) 8)  ! Vytorin (Ezetimibe-Simvastatin)  Past History:  Past medical, surgical, family and social histories (including risk factors) reviewed for relevance to current acute and chronic problems.  Past Medical History: Reviewed history from 02/05/2008 and no changes required. Atrial fibrillation Diabetes mellitus, type II Hyperlipidemia Hypertension Hypothyroidism Osteoporosis Anxiety GERD/Esophageal web Ovarian cancer Grnauloma annulare------------------------------------------------Dr York Pellant Dr Ether Griffins  [phone]  Past Surgical History: Reviewed history from 11/20/2006 and no changes required. 1958    Right femoral hernia 1969    Stillbirth 1971    Laminectomy L4-5 1980    TAH/BSO - ovarian cancer 1982    2nd look laparotomy 1986    Cholecystectomy 1991    Kidney stones x 2 2000    Fracture, left  elbow/wrist 12/98   Intussception/obstruction 11/05   Fracture, right wrist 8/05     CP - cath (-)  A. Fib. found 2002    Cataract OD 8/05     Syncope  (-) myoview stress 1/06     EGD/dilation/colon 9/04 and 1/02  DEXA 5/07     Cataract, left  Family History: Reviewed history from 11/20/2006 and no changes required. Father: Died 63 MI Mother: Died 40 CVA, DM Siblings: 5 brothers, 2 died with MI's               3 sisters CV:  Dad/brothers DM:  Mom, brother Prostate CA:  2 brothers Colon CA:  1 brother  Social History: Reviewed history from 11/20/2006 and no changes required. 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   Neck:  supple, no masses, no thyromegaly, no carotid bruits, and no cervical lymphadenopathy.   Lungs:  normal respiratory effort and normal breath sounds.   Heart:  normal rate, no murmur, no gallop, and irregular rhythm.   Msk:  no joint tenderness and no joint swelling.   Pulses:  faint on left not palpable on right Extremities:  no edema Psych:  normally interactive, good eye  contact, not anxious appearing, and not depressed appearing.    Diabetes Management Exam:    Foot Exam (with socks and/or shoes not present):       Sensory-Pinprick/Light touch:          Left medial foot (L-4): diminished          Left dorsal foot (L-5): diminished          Left lateral foot (S-1): diminished          Right medial foot (L-4): diminished          Right dorsal foot (L-5): diminished          Right lateral foot (S-1): diminished       Inspection:          Left foot: normal          Right foot: normal       Nails:          Left foot: normal          Right foot: normal   Impression &  Recommendations:  Problem # 1:  HYPERTENSION (ICD-401.9) Assessment Comment Only  seems to have some degree of orthostatasis will take the HCTZ out of the med  The following medications were removed from the medication list:    Hyzaar 50-12.5 Mg Tabs (Losartan potassium-hctz) .Marland Kitchen... Take one by mouth daily Her updated medication list for this problem includes:    Losartan Potassium 50 Mg Tabs (Losartan potassium) .Marland Kitchen... 1 tab daily for high blood pressure    Lasix 20 Mg Tabs (Furosemide) ..... Once daily as needed  Orders: TLB-Renal Function Panel (80069-RENAL) TLB-CBC Platelet - w/Differential (85025-CBCD) TLB-Hepatic/Liver Function Pnl (80076-HEPATIC) Venipuncture (51884)  Problem # 2:  DIABETES MELLITUS, TYPE II (ICD-250.00) Assessment: Comment Only  with mild neuropathy---this is adding to her slight instability. Discussed cane sounds like control is acceptable will check labs  The following medications were removed from the medication list:    Hyzaar 50-12.5 Mg Tabs (Losartan potassium-hctz) .Marland Kitchen... Take one by mouth daily Her updated medication list for this problem includes:    Losartan Potassium 50 Mg Tabs (Losartan potassium) .Marland Kitchen... 1 tab daily for high blood pressure    Glucotrol Xl 10 Mg Tb24 (Glipizide) .Marland Kitchen... Take 1 tablet by mouth once a day    Metformin Hcl 500 Mg Xr24h-tab (Metformin hcl) .Marland Kitchen... 1 daily two times a day  Labs Reviewed: Creat: 0.6 (11/27/2008)     Last Eye Exam: No diabetic retinopathy.   Age related macular degeneration Follow up 1 year (07/21/2009) Reviewed HgBA1c results: 7.3 (05/21/2009)  7.5 (11/27/2008)  Orders: TLB-A1C / Hgb A1C (Glycohemoglobin) (83036-A1C)  Problem # 3:  ATRIAL FIBRILLATION (ICD-427.31) Assessment: Unchanged rate control is okay needs to continue the coumadin  Her updated medication list for this problem includes:    Warfarin Sodium 2.5 Mg Tabs (Warfarin sodium) .Marland Kitchen... As directed    Lanoxin 0.125 Mg Tabs (Digoxin)  .Marland Kitchen... Take one by mouth daily  Problem # 4:  HYPOTHYROIDISM (ICD-244.9) Assessment: Unchanged  will check labs  Her updated medication list for this problem includes:    Synthroid 88 Mcg Tabs (Levothyroxine sodium) .Marland Kitchen... Take 1/2  by mouth every other day and 1 tab every other day  Labs Reviewed: TSH: 1.17 (11/27/2008)    HgBA1c: 7.3 (05/21/2009) Chol: 202 (11/27/2008)   HDL: 43.30 (11/27/2008)   LDL: DEL (05/31/2007)   TG: 213.0 (11/27/2008)  Orders: TLB-TSH (Thyroid Stimulating Hormone) (84443-TSH) TLB-T4 (Thyrox), Free [phone])  Problem #  5:  NEOP, MALIGNANT, OVARY (ICD-183.0) Assessment: Comment Only just saw Dr Ether Griffins cancer markers pending  Complete Medication List: 1)  Losartan Potassium 50 Mg Tabs (Losartan potassium) .Marland Kitchen.. 1 tab daily for high blood pressure 2)  Warfarin Sodium 2.5 Mg Tabs (Warfarin sodium) .... As directed 3)  Lanoxin 0.125 Mg Tabs (Digoxin) .... Take one by mouth daily 4)  Lasix 20 Mg Tabs (Furosemide) .... Once daily as needed 5)  Synthroid 88 Mcg Tabs (Levothyroxine sodium) .... Take 1/2  by mouth every other day and 1 tab every other day 6)  Glucotrol Xl 10 Mg Tb24 (Glipizide) .... Take 1 tablet by mouth once a day 7)  Metformin Hcl 500 Mg Xr24h-tab (Metformin hcl) .Marland Kitchen.. 1 daily two times a day 8)  Fluticasone Propionate 50 Mcg/act Susp (Fluticasone propionate) .... 2 sprays each nostril daily for allergies 9)  Vitamin B-12 1000 Mcg Subl (Cyanocobalamin) .... With b6 and folic acid 10)  Lifescan Unistik Ii Lancets Misc (Lancets) .... Test two times a day or as needed 11)  Miralax Powd (Polyethylene glycol 3350) .... Use 1 scoop every 2-3 days 12)  Vitamin C Cr 500 Mg Cr-tabs (Ascorbic acid) .... Take 1 by mouth once daily 13)  Omega-3 Cf 1000 Mg Caps (Omega-3 fatty acids) .... Take 1 by mouth two times a day 14)  Citracal Petites/vitamin D 200-250 Mg-unit Tabs (Calcium citrate-vitamin d) .... Take 1 by mouth two times a day 15)  Coenzyme Q10 60  Mg Tabs (Coenzyme q10) .... Take 1 by mouth two times a day 16)  Daily Multi Tabs (Multiple vitamins-minerals) .... Take 1 by mouth in the morning and 1 by mouth in the evening 17)  Cinnamon 500 Mg Caps (Cinnamon) .... Take 1 by mouth two times a day 18)  Onetouch Ultra Test Strp (Glucose blood) .... Use as directed, pt test two times a day or as needed  Patient Instructions: 1)  Please change to the plain losartan 2)  Please schedule a follow-up appointment in 6 months .  Prescriptions: LOSARTAN POTASSIUM 50 MG TABS (LOSARTAN POTASSIUM) 1 tab daily for high blood pressure  #30 x 12   Entered and Authorized by:   Cindee Salt MD   Signed by:   Cindee Salt MD on 09/16/2009   Method used:   Electronically to        [facility] [phone]* (retail)       [address] [place], [place]  [address]       Ph: [phone]       Fax: [phone]   RxID:   [id number]   Current Allergies (reviewed today): ! PENICILLIN V POTASSIUM (PENICILLIN V POTASSIUM) ! CODEINE ! LOVASTATIN (LOVASTATIN) ! GLUCOPHAGE (METFORMIN HCL) ! AVANDIA (ROSIGLITAZONE MALEATE) ! FOSAMAX (ALENDRONATE SODIUM) ! LIPITOR (ATORVASTATIN CALCIUM) ! VYTORIN (EZETIMIBE-SIMVASTATIN)

## 2010-05-24 NOTE — Letter (Signed)
Summary: Dr.[facility] Gynecology Oncology,Note  Dr.[place] [facility] Gynecology Oncology,Note   Imported By: Beau Fanny 09/16/2009 16:40:26  _____________________________________________________________________  External Attachment:    Type:   Image     Comment:   External Document  Appended Document: Dr.[place] [facility] Gynecology Oncology,Note pap and tumor markers checked again

## 2010-05-24 NOTE — Progress Notes (Signed)
Summary: due for colonoscopy  Phone Note Call from Patient   Caller: Patient Call For: Cindee Salt MD Summary of Call: Pt is due for colonoscopy, she asks for names of doctors at [facility].  I gave her a few names, she will call them for consult appt and will call back here if she needs referral. Initial call taken by: Lowella Petties CMA, AAMA,  February 28, 2010 12:33 PM  Follow-up for Phone Call        okay  Shirlee Limerick, Please see if she needs any help with this Follow-up by: Cindee Salt MD,  February 28, 2010 2:03 PM  Additional Follow-up for Phone Call Additional follow up Details #1::        Patient has been set up with Dr Leone Payor on 04/20/2010 for New patient office visit due to her age, must see Dr first before Colonoscopy. Additional Follow-up by: Carlton Adam,  February 28, 2010 2:38 PM    Additional Follow-up for Phone Call Additional follow up Details #2::    okay, thanks Follow-up by: Cindee Salt MD,  February 28, 2010 4:54 PM

## 2010-05-24 NOTE — Assessment & Plan Note (Signed)
Summary: 6 m f/u dlo   Vital Signs:  Patient profile:   77 year old female Weight:      181 pounds Temp:     98.3 degrees F oral Pulse rate:   72 / minute Pulse rhythm:   regular BP sitting:   110 / 70  (left arm) Cuff size:   regular  Vitals Entered By: Mervin Hack CMA Duncan Dull) (May 21, 2009 2:41 PM) CC: 6 month follow-up   History of Present Illness: Doing okay  Still follows with Dr Ether Griffins Persistent CA-125 elevation--can't find evidence of recurrent cancer New test HE4 has been normal  Granuloma annulare continues --even some on her face Much better though follows with Dr Jarold Motto  recent cold then pain on right side diagnosed with trigeminal neuralgia by Dr Elenore Rota  Diabetes okay Sugars 134-160 fasting checks daily or somewhat less No hypoglycemic reactions  working with trainer 2 times per week and then does cardio No chest pain no SOB Stamina is pretty good  Allergies: 1)  ! Penicillin V Potassium (Penicillin V Potassium) 2)  ! Codeine 3)  ! Lovastatin (Lovastatin) 4)  ! Glucophage (Metformin Hcl) 5)  ! Avandia (Rosiglitazone Maleate) 6)  ! Fosamax (Alendronate Sodium) 7)  ! Lipitor (Atorvastatin Calcium) 8)  ! Vytorin (Ezetimibe-Simvastatin)  Past History:  Past medical, surgical, family and social histories (including risk factors) reviewed for relevance to current acute and chronic problems.  Past Medical History: Reviewed history from 02/05/2008 and no changes required. Atrial fibrillation Diabetes mellitus, type II Hyperlipidemia Hypertension Hypothyroidism Osteoporosis Anxiety GERD/Esophageal web Ovarian cancer Grnauloma annulare------------------------------------------------Dr York Pellant Dr Ether Griffins  [phone]  Past Surgical History: Reviewed history from 11/20/2006 and no changes required. 1958    Right femoral hernia 1969    Stillbirth 1971    Laminectomy L4-5 1980    TAH/BSO - ovarian cancer 1982     2nd look laparotomy 1986    Cholecystectomy 1991    Kidney stones x 2 2000    Fracture, left elbow/wrist 12/98   Intussception/obstruction 11/05   Fracture, right wrist 8/05     CP - cath (-)  A. Fib. found 2002    Cataract OD 8/05     Syncope  (-) myoview stress 1/06     EGD/dilation/colon 9/04 and 1/02  DEXA 5/07     Cataract, left  Family History: Reviewed history from 11/20/2006 and no changes required. Father: Died 36 MI Mother: Died 31 CVA, DM Siblings: 5 brothers, 2 died with MI's               3 sisters CV:  Dad/brothers DM:  Mom, brother Prostate CA:  2 brothers Colon CA:  1 brother  Social History: Reviewed history from 11/20/2006 and no changes required. 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   Neck:  supple, no masses, no thyromegaly, no carotid bruits, and no cervical lymphadenopathy.   Lungs:  normal respiratory effort and normal breath sounds.   Heart:  normal rate, regular rhythm, no murmur, and no gallop.   Pulses:  faint in feet Extremities:  no edema  Skin:  no suspicious lesions and no ulcerations.  Psych:  normally interactive, good eye contact, not anxious appearing, and not depressed appearing.     Impression & Recommendations:  Problem # 1:  DIABETES MELLITUS, TYPE II (ICD-250.00) Assessment Unchanged  seems to be fine will recheck control  Her updated medication list for this problem includes:    Hyzaar 50-12.5 Mg Tabs (Losartan potassium-hctz) .Marland Kitchen... Take one by mouth daily    Glucotrol Xl 10 Mg Tb24 (Glipizide) .Marland Kitchen... Take 1 tablet by mouth once a day    Metformin Hcl 500 Mg Xr24h-tab (Metformin hcl) .Marland Kitchen... 1 daily two  times a day  Labs Reviewed: Creat: 0.6 (11/27/2008)     Last Eye Exam: No diabetic retinopathy.   Dr Clydene Pugh (07/14/2008) Reviewed HgBA1c results: 7.5 (11/27/2008)  8.4 (04/07/2008)  Orders: Venipuncture (85277) T- Hemoglobin A1C (82423-53614)  Problem # 2:  HYPERTENSION (ICD-401.9) Assessment: Unchanged good control no changes  Her updated medication list for this problem includes:    Hyzaar 50-12.5 Mg Tabs (Losartan potassium-hctz) .Marland Kitchen... Take one by mouth daily    Lasix 20 Mg Tabs (Furosemide) ..... Once daily as needed  BP today: 110/70 Prior BP: 132/80 (11/27/2008)  Labs Reviewed: K+: 4.4 (11/27/2008) Creat: : 0.6 (11/27/2008)   Chol: 202 (11/27/2008)   HDL: 43.30 (11/27/2008)   LDL: DEL (05/31/2007)   TG: 213.0 (11/27/2008)  Problem # 3:  HYPERLIPIDEMIA (ICD-272.4) Assessment: Unchanged keeping with her OTC meds  Labs Reviewed: SGOT: 28 (11/27/2008)   SGPT: 14 (11/27/2008)   HDL:43.30 (11/27/2008), 47.6 (05/31/2007)  LDL:DEL (05/31/2007), DEL (12/03/2006)  Chol:202 (11/27/2008), 208 (05/31/2007)  Trig:213.0 (11/27/2008), 164 (05/31/2007)  Problem # 4:  ATRIAL FIBRILLATION (ICD-427.31) Assessment: Unchanged good rate control on coumadin  Her updated medication list for this problem includes:    Warfarin Sodium 2.5 Mg Tabs (Warfarin sodium) .Marland Kitchen... As directed    Lanoxin 0.125 Mg Tabs (Digoxin) .Marland Kitchen... Take one by mouth daily  Complete Medication List: 1)  Hyzaar 50-12.5 Mg Tabs (Losartan potassium-hctz) .... Take one by mouth daily 2)  Warfarin Sodium 2.5 Mg Tabs (Warfarin sodium) .... As directed 3)  Lanoxin 0.125 Mg Tabs (Digoxin) .... Take one by mouth daily 4)  Lasix 20 Mg Tabs (Furosemide) .... Once daily as needed 5)  Synthroid 88 Mcg Tabs (Levothyroxine sodium) .... Take 1/2  by mouth every other day and 1 tab every other day 6)  Vitamin B-12 1000 Mcg Subl (Cyanocobalamin) .... With b6 and folic acid 7)  Glucotrol Xl 10 Mg Tb24 (Glipizide) .... Take 1  tablet by mouth once a day 8)  Metformin Hcl 500 Mg Xr24h-tab (Metformin hcl) .Marland Kitchen.. 1 daily two times a day 9)  Fluticasone Propionate 50 Mcg/act Susp (Fluticasone propionate) .... 2 sprays each nostril daily for allergies 10)  Lifescan Unistik Ii Lancets Misc (Lancets) .... Test two times a day or as needed 11)  Miralax Powd (Polyethylene glycol 3350) .... Use 1 scoop every 2-3 days 12)  Vitamin C Cr 500 Mg Cr-tabs (Ascorbic acid) .... Take 1 by mouth once daily 13)  Omega-3 Cf 1000 Mg Caps (Omega-3 fatty acids) .... Take 1 by mouth two times a day 14)  Citracal Petites/vitamin D 200-250 Mg-unit Tabs (Calcium citrate-vitamin d) .... Take 1 by mouth two times a day 15)  Coenzyme Q10 60 Mg Tabs (Coenzyme q10) .... Take 1 by mouth two times a day 16)  Daily Multi Tabs (Multiple vitamins-minerals) .... Take 1 by mouth in the morning and 1 by mouth in the evening 17)  Cinnamon 500 Mg Caps (Cinnamon) .Marland KitchenMarland KitchenMarland Kitchen  Take 1 by mouth two times a day 18)  Onetouch Ultra Test Strp (Glucose blood) .... Use as directed, pt test two times a day or as needed  Patient Instructions: 1)  Please schedule a follow-up appointment in 4 months .   Current Allergies (reviewed today): ! PENICILLIN V POTASSIUM (PENICILLIN V POTASSIUM) ! CODEINE ! LOVASTATIN (LOVASTATIN) ! GLUCOPHAGE (METFORMIN HCL) ! AVANDIA (ROSIGLITAZONE MALEATE) ! FOSAMAX (ALENDRONATE SODIUM) ! LIPITOR (ATORVASTATIN CALCIUM) ! VYTORIN (EZETIMIBE-SIMVASTATIN)   Mammogram  Procedure date:  06/22/2008  Findings:      Normal Ordered by Dr Ether Griffins  Appended Document: 6 m f/u dlo  Laboratory Results   Blood Tests   Date/Time Recieved: May 21, 2009 3:15 PM  Date/Time Reported: May 21, 2009 3:16 PM   PT: 18.8 s   (Normal Range: 10.6-13.4)  INR: 2.4   (Normal Range: 0.88-1.12   Therap INR: 2.0-3.5)      ANTICOAGULATION RECORD PREVIOUS REGIMEN & LAB RESULTS Anticoagulation Diagnosis:  Atrial fibrillation on  04/30/2009 Previous  INR Goal Range:  2.0-3.5 on  04/30/2009 Previous INR:  2.4 on  04/30/2009 Previous Coumadin Dose(mg):  5/2.5 on  04/30/2009 Previous Regimen:  no change on  04/30/2009 Previous Coagulation Comments:  . on  04/29/2008  NEW REGIMEN & LAB RESULTS Current INR: 2.4 Regimen: no change  (no change)  Provider: [facility] [place]      Repeat testing in: 4 weeks MEDICATIONS HYZAAR 50-12.5 MG  TABS (LOSARTAN POTASSIUM-HCTZ) Take one by mouth daily WARFARIN SODIUM 2.5 MG  TABS (WARFARIN SODIUM) as directed LANOXIN 0.125 MG  TABS (DIGOXIN) Take one by mouth daily LASIX 20 MG  TABS (FUROSEMIDE) once daily as needed SYNTHROID 88 MCG  TABS (LEVOTHYROXINE SODIUM) Take 1/2  by mouth every other day and 1 tab every other day VITAMIN B-12 1000 MCG  SUBL (CYANOCOBALAMIN) WITH B6 AND FOLIC ACID GLUCOTROL XL 10 MG TB24 (GLIPIZIDE) Take 1 tablet by mouth once a day METFORMIN HCL 500 MG XR24H-TAB (METFORMIN HCL) 1 daily two times a day FLUTICASONE PROPIONATE 50 MCG/ACT SUSP (FLUTICASONE PROPIONATE) 2 sprays each nostril daily for allergies LIFESCAN UNISTIK II LANCETS  MISC (LANCETS) test two times a day or as needed MIRALAX  POWD (POLYETHYLENE GLYCOL 3350) use 1 scoop every 2-3 days VITAMIN C CR 500 MG CR-TABS (ASCORBIC ACID) take 1 by mouth once daily OMEGA-3 CF 1000 MG CAPS (OMEGA-3 FATTY ACIDS) take 1 by mouth two times a day CITRACAL PETITES/VITAMIN D 200-250 MG-UNIT TABS (CALCIUM CITRATE-VITAMIN D) take 1 by mouth two times a day COENZYME Q10 60 MG TABS (COENZYME Q10) take 1 by mouth two times a day DAILY MULTI  TABS (MULTIPLE VITAMINS-MINERALS) take 1 by mouth in the morning and 1 by mouth in the evening CINNAMON 500 MG CAPS (CINNAMON) take 1 by mouth two times a day ONETOUCH ULTRA TEST  STRP (GLUCOSE BLOOD) use as directed, pt test two times a day or as needed   Anticoagulation Visit Questionnaire      Coumadin dose missed/changed:  No      Abnormal Bleeding Symptoms:  No Any diet changes including alcohol  intake, vegetables or greens since the last visit:  No Any illnesses or hospitalizations since the last visit:  No Any signs of clotting since the last visit (including chest discomfort, dizziness, shortness of breath, arm tingling, slurred speech, swelling or redness in leg):  No   Appended Document: 6 m f/u dlo

## 2010-05-24 NOTE — Progress Notes (Signed)
Summary: refill request for digoxin  Phone Note Refill Request   Refills Requested: Medication #1:  LANOXIN 0.125 MG  TABS Take one by mouth daily   Last Refilled: 01/03/2010 Faxed request from [facility], [phone].  Initial call taken by: Lowella Petties CMA,  February 01, 2010 2:41 PM  Follow-up for Phone Call        Rx faxed to pharmacy Follow-up by: Mervin Hack CMA Duncan Dull),  February 01, 2010 3:13 PM    Prescriptions: LANOXIN 0.125 MG  TABS (DIGOXIN) Take one by mouth daily  #30 x 12   Entered by:   Mervin Hack CMA (AAMA)   Authorized by:   Cindee Salt MD   Signed by:   Mervin Hack CMA (AAMA) on 02/01/2010   Method used:   Electronically to        [facility] [phone]* (retail)       [address] [place], [place]  [address]       Ph: [phone]       Fax: [phone]   RxID:   [id number]

## 2010-05-24 NOTE — Letter (Signed)
Summary: [facility]   Imported By: Lanelle Bal 08/05/2009 11:02:14  _____________________________________________________________________  External Attachment:    Type:   Image     Comment:   External Document  Appended Document: [facility]    Clinical Lists Changes  Observations: Added new observation of DIAB EYE EX: No diabetic retinopathy.   Age related macular degeneration Follow up 1 year (07/21/2009 8:07)       Diabetic Eye Exam  Procedure date:  07/21/2009  Findings:      No diabetic retinopathy.   Age related macular degeneration Follow up 1 year

## 2010-05-24 NOTE — Progress Notes (Signed)
Summary: pt is requesting lab work  Phone Note Call from Patient Call back at [employer] [phone]   Caller: Patient Call For: Cindee Salt MD Summary of Call: Pt recently had labs done but cholesterol and digoxon level weren't checked.  She is asking if she can have these done when she comes in for her next protime. Initial call taken by: Lowella Petties CMA,  September 29, 2009 9:31 AM  Follow-up for Phone Call        yes okay to add these Follow-up by: Cindee Salt MD,  September 29, 2009 2:30 PM  Additional Follow-up for Phone Call Additional follow up Details #1::        labs added but I changed her appt to 10/15/2009 @ 8:45 because pt wanted to be fasting. Additional Follow-up by: Mervin Hack CMA Duncan Dull),  September 29, 2009 3:35 PM

## 2010-05-24 NOTE — Progress Notes (Signed)
Summary: legs and feet are swollen   Phone Note Call from Patient Call back at Home Phone [phone]   Caller: Patient Call For: Cindee Salt MD Summary of Call: Patient says that her legs and feet are very swollen. She says that she normally does not take her lasix daily, but this week and taken it 3 different days and does not seem to be helping. She wants to know if she should increase her lasix or what she should do. She says that she has been trying to keep her legs elevated when possible. Please advise. Initial call taken by: Melody Comas,  October 29, 2009 11:29 AM  Follow-up for Phone Call        she is on a very low dose Have her try 2 of the lasix at at time (together) to make 40mg  daily If not improving, or if breathing issues, will need to be seen next week ER if any sig breathing problems Follow-up by: Cindee Salt MD,  October 29, 2009 12:48 PM  Additional Follow-up for Phone Call Additional follow up Details #1::        Advised pt, she states she is not having any breathing problems. Additional Follow-up by: Lowella Petties CMA,  October 29, 2009 1:55 PM

## 2010-05-26 NOTE — Letter (Signed)
Summary: [facility]   Imported By: Lanelle Bal 04/07/2010 14:05:11  _____________________________________________________________________  External Attachment:    Type:   Image     Comment:   External Document  Appended Document: [facility] treating diabetic neuropathy couldn't tolerate lyrica so now on gabapentin She is concerned because of her balance problems----- deferring consideration of neuro work up to me (may just be from the neuropathy)

## 2010-05-26 NOTE — Letter (Signed)
Summary: [employer] Wellness Visit Questionnaire   [employer] Annual Wellness Visit Questionnaire   Imported By: Beau Fanny 05/10/2010 14:43:22  _____________________________________________________________________  External Attachment:    Type:   Image     Comment:   External Document

## 2010-05-26 NOTE — Letter (Signed)
Summary: [facility]   Imported By: Lanelle Bal 04/11/2010 13:19:41  _____________________________________________________________________  External Attachment:    Type:   Image     Comment:   External Document  Appended Document: [facility] getting EGD for dysphagia to liquids and repeat colon for polyps will consider modified barium swallow if no answer from this

## 2010-05-26 NOTE — Letter (Signed)
Summary: [facility]  [facility]   Imported By: Lanelle Bal 04/26/2010 11:13:54  _____________________________________________________________________  External Attachment:    Type:   Image     Comment:   External Document  Appended Document: [facility] right wrist sprain on top of osteoarthritis naproxen and brace prescribed

## 2010-05-26 NOTE — Assessment & Plan Note (Signed)
Summary: WELLNESS [facility]   Vital Signs:  Patient profile:   77 year old female Height:      63 inches Weight:      182 pounds Temp:     98.5 degrees F oral Pulse rate:   113 / minute Pulse rhythm:   regular BP sitting:   158 / 98  (left arm) Cuff size:   large  Vitals Entered By: Mervin Hack CMA Duncan Dull) (May 10, 2010 8:45 AM) CC: adult physical   History of Present Illness: here for wellness and physical  Forms reviewed Some concern about fall risk---Dr Al Corpus working on ingrown toes Not exercising of late due to various ortho problems Started lyrica due to neuropathy--couldn't tolerate Then tried gabapenin---off now but may restart at higher dose  Sprained wrist before Christmas--really "put me out"  Checking sugars every few days Sugars 125-160 Occ gets sense of low sugar--rare and mild Ongoing feet problems  Allergies: 1)  ! Penicillin V Potassium (Penicillin V Potassium) 2)  ! Codeine 3)  ! Lovastatin (Lovastatin) 4)  ! Glucophage (Metformin Hcl) 5)  ! Avandia (Rosiglitazone Maleate) 6)  ! Fosamax (Alendronate Sodium) 7)  ! Lipitor (Atorvastatin Calcium) 8)  ! Vytorin (Ezetimibe-Simvastatin)  Past History:  Past medical, surgical, family and social histories (including risk factors) reviewed for relevance to current acute and chronic problems.  Past Medical History: Reviewed history from 02/05/2008 and no changes required. Atrial fibrillation Diabetes mellitus, type II Hyperlipidemia Hypertension Hypothyroidism Osteoporosis Anxiety GERD/Esophageal web Ovarian cancer Grnauloma annulare------------------------------------------------Dr York Pellant Dr Ether Griffins  [phone]  Past Surgical History: Reviewed history from 11/20/2006 and no changes required. 1958    Right femoral hernia 1969    Stillbirth 1971    Laminectomy L4-5 1980    TAH/BSO - ovarian cancer 1982    2nd look laparotomy 1986    Cholecystectomy 1991    Kidney  stones x 2 2000    Fracture, left elbow/wrist 12/98   Intussception/obstruction 11/05   Fracture, right wrist 8/05     CP - cath (-)  A. Fib. found 2002    Cataract OD 8/05     Syncope  (-) myoview stress 1/06     EGD/dilation/colon 9/04 and 1/02  DEXA 5/07     Cataract, left  Family History: Reviewed history from 11/20/2006 and no changes required. 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 WOuld accept CPR but no prolonged artificial respiration. Would not want feeding tube  Review of Systems General:  hasn't been exercising not much taste but eats okay weight is about the same Sleeps okay wears seat belt . Eyes:  Denies double vision and vision loss-1 eye. ENT:  Denies decreased hearing and ringing in ears; has noted some hoarseness but doesn't feel like she has a cold teeth okay--sees dentist. CV:  Complains of palpitations; denies chest pain or discomfort, difficulty breathing at night, difficulty breathing while lying down, fainting, and shortness of breath with exertion; occ notes increase in heart rate--no distinct palpitations. Resp:  Complains of cough; denies shortness of breath; lots of  cough now--??GERD related. GI:  Complains of constipation; denies abdominal pain, bloody stools, change in bowel habits, and dark tarry stools; some swallowing problems--had dilation in past Now has EGD and colon scheduled. GU:  Denies dysuria and incontinence; some urgency. MS:  Complains of joint pain and low back pain; recent sprained wrist ongoing foot problems really has gotten sedentary due to multiple issues. Derm:  Complains of rash;  denies lesion(s); granuloma annulare is better now. Neuro:  Complains of headaches and numbness; denies weakness; neuropathy in feet occ head pain. Psych:  Denies anxiety and depression; has felt bad over the past month due to multiple issues. Heme:  Denies abnormal bruising and enlarge lymph nodes. Allergy:  Complains of seasonal allergies and sneezing; allergies are better.  Physical Exam  General:  alert and normal appearance.   Eyes:  pupils equal, pupils round, pupils reactive to light, and no optic disk abnormalities.   Ears:  R ear normal and L ear normal.   Mouth:  no erythema, no exudates, and no lesions.   Neck:  supple, no masses, no thyromegaly, no carotid bruits, and no cervical lymphadenopathy.   Lungs:  normal respiratory effort, no intercostal retractions, no accessory muscle use, and normal breath sounds.   Heart:  normal rate, no murmur, no gallop, and irregular rhythm.   Abdomen:  soft, non-tender, and no masses.   Msk:  no joint tenderness and no joint swelling.   Pulses:  faint in feet Extremities:  no sig edema superficial varicosities in calves Neurologic:  alert & oriented X3 and strength normal in all extremities.   Skin:  no rashes and no suspicious lesions.   Axillary Nodes:  No palpable lymphadenopathy Psych:  normally interactive, good eye contact, not depressed appearing, and slightly anxious.    Diabetes Management Exam:    Foot Exam (with socks and/or shoes not present):       Sensory-Pinprick/Light touch:          Left medial foot (L-4): diminished          Left dorsal foot (L-5): diminished          Left lateral foot (S-1): diminished          Right medial foot (L-4): diminished          Right dorsal foot (L-5): diminished          Right lateral foot (S-1): diminished       Inspection:          Left foot: normal          Right foot: normal       Nails:          Left foot: thickened          Right foot: thickened   Impression &  Recommendations:  Problem # 1:  PREVENTIVE HEALTH CARE (ICD-V70.0) Assessment Comment Only colon and EGD later this week Discussed no more cancer screening after this year if everything okay needs to restart activity after lull with back and wrist problems  Problem # 2:  DIABETES MELLITUS, TYPE II (ICD-250.00) Assessment: Unchanged  will recheck labs  Her updated medication list for this problem includes:    Losartan Potassium 50 Mg Tabs (Losartan potassium) .Marland Kitchen... 1 tab daily for high blood pressure    Glucotrol Xl 10 Mg Tb24 (Glipizide) .Marland Kitchen... Take 1 tablet by mouth once a day    Metformin Hcl 500 Mg Xr24h-tab (Metformin hcl) .Marland Kitchen... 1 daily two times a day  Labs Reviewed: Creat: 0.7 (09/16/2009)     Last Eye Exam: No diabetic retinopathy.   Age related macular degeneration Follow up 1 year (07/21/2009) Reviewed HgBA1c results: 7.5 (09/16/2009)  7.3 (05/21/2009)  Orders: TLB-A1C / Hgb A1C (Glycohemoglobin) (83036-A1C) TLB-Microalbumin/Creat Ratio, Urine (82043-MALB)  Problem # 3:  ATRIAL FIBRILLATION (ICD-427.31) Assessment: Unchanged good rate control on anticoagulation  Her updated medication list for this problem includes:    Warfarin Sodium 2.5 Mg Tabs (Warfarin sodium) .Marland Kitchen... As directed    Lanoxin 0.125 Mg Tabs (Digoxin) .Marland Kitchen... Take one by mouth daily  Problem # 4:  HYPOTHYROIDISM (ICD-244.9) Assessment: Unchanged  due for labs  Her updated medication list for this problem includes:    Synthroid 88 Mcg Tabs (Levothyroxine sodium) .Marland Kitchen... Take 1/2  by mouth every other day and 1 tab every other day  Orders: TLB-T4 (Thyrox), Free [id number]) TLB-TSH (Thyroid Stimulating Hormone) (84443-TSH)  Problem # 5:  HYPERTENSION (ICD-401.9) Assessment: Unchanged  up today but really anxious about lots of things no change for now  Her updated medication list for this problem includes:    Losartan Potassium 50 Mg Tabs (Losartan potassium) .Marland Kitchen... 1 tab daily for high blood  pressure    Lasix 20 Mg Tabs (Furosemide) ..... Once daily as needed  BP today: 158/98 Prior BP: 112/70 (09/16/2009)  Labs Reviewed: K+: 4.6 (09/16/2009) Creat: : 0.7 (09/16/2009)   Chol: 197 (10/15/2009)   HDL: 47.20 (10/15/2009)   LDL: 119 (10/15/2009)   TG: 152.0 (10/15/2009)  Orders: TLB-Renal Function Panel (80069-RENAL) TLB-CBC Platelet - w/Differential (85025-CBCD) TLB-Hepatic/Liver Function Pnl (80076-HEPATIC) Venipuncture (19147)  Problem # 6:  ANXIETY (ICD-300.00) Assessment: Unchanged has had confidence rocked discussed   Complete Medication List: 1)  Losartan Potassium 50 Mg Tabs (Losartan potassium) .Marland Kitchen.. 1 tab daily for high blood pressure 2)  Warfarin Sodium 2.5 Mg Tabs (Warfarin sodium) .... As directed 3)  Lanoxin 0.125 Mg Tabs (Digoxin) .... Take one by mouth daily 4)  Lasix 20 Mg Tabs (Furosemide) .... Once daily as needed 5)  Synthroid 88 Mcg Tabs (Levothyroxine sodium) .... Take 1/2  by mouth every other day and 1 tab every other day 6)  Glucotrol Xl 10 Mg Tb24 (Glipizide) .... Take 1 tablet by mouth once a day 7)  Metformin Hcl 500 Mg Xr24h-tab (Metformin hcl) .Marland Kitchen.. 1 daily two times a day 8)  Vitamin B-12 1000 Mcg Subl (Cyanocobalamin) .... With b6 and folic acid 9)  Lifescan Unistik Ii Lancets Misc (Lancets) .... Test two times a day or as needed 10)  Miralax Powd (Polyethylene glycol 3350) .... Use 1 scoop every 2-3 days 11)  Vitamin C Cr 500 Mg Cr-tabs (Ascorbic acid) .... Take 1 by mouth once daily 12)  Omega-3 Cf 1000 Mg Caps (Omega-3 fatty acids) .... Take 1 by mouth two times a day 13)  Citracal Petites/vitamin D 200-250 Mg-unit Tabs (Calcium citrate-vitamin d) .... Take 1 by mouth two times a day 14)  Coenzyme Q10 60 Mg Tabs (Coenzyme q10) .... Take 1 by mouth two times a day 15)  Daily Multi Tabs (Multiple vitamins-minerals) .... Take 1 by mouth in the morning and 1 by mouth in the evening 16)  Cinnamon 500 Mg Caps (Cinnamon) .... Take 1 by mouth two  times a day 17)  Onetouch Ultra Test Strp (Glucose blood) .... Use as directed, pt test two times a day or as needed  Patient Instructions: 1)  Please schedule a follow-up appointment in 6 months .    Orders Added:  1)  TLB-A1C / Hgb A1C (Glycohemoglobin) [83036-A1C] 2)  TLB-Microalbumin/Creat Ratio, Urine [82043-MALB] 3)  TLB-T4 (Thyrox), Free [16109-UE4V] 4)  TLB-TSH (Thyroid Stimulating Hormone) [84443-TSH] 5)  TLB-Renal Function Panel [80069-RENAL] 6)  TLB-CBC Platelet - w/Differential [85025-CBCD] 7)  TLB-Hepatic/Liver Function Pnl [80076-HEPATIC] 8)  Venipuncture [36415] 9)  Est. Patient 65& > [40981]    Current Allergies (reviewed today): ! PENICILLIN V POTASSIUM (PENICILLIN V POTASSIUM) ! CODEINE ! LOVASTATIN (LOVASTATIN) ! GLUCOPHAGE (METFORMIN HCL) ! AVANDIA (ROSIGLITAZONE MALEATE) ! FOSAMAX (ALENDRONATE SODIUM) ! LIPITOR (ATORVASTATIN CALCIUM) ! VYTORIN (EZETIMIBE-SIMVASTATIN)  Appended Document: [facility]  Laboratory Results   Blood Tests   Date/Time Recieved: May 10, 2010 10:05 AM  Date/Time Reported: May 10, 2010 10:05 AM   PT: 25.6 s   (Normal Range: 10.6-13.4)  INR: 2.1   (Normal Range: 0.88-1.12   Therap INR: 2.0-3.5)      ANTICOAGULATION RECORD PREVIOUS REGIMEN & LAB RESULTS Anticoagulation Diagnosis:  Atrial fibrillation on  04/30/2009 Previous INR Goal Range:  2.0-3.5 on  04/30/2009 Previous INR:  2.6 on  04/04/2010 Previous Coumadin Dose(mg):  5/2.5 on  04/04/2010 Previous Regimen:  5/2.5 on  04/04/2010 Previous Coagulation Comments:  . on  04/29/2008  NEW REGIMEN & LAB RESULTS Current INR: 2.1 Regimen: 5/2.5  (no change)  Provider: Malvin Morrish      Repeat testing in: 4 WEEKS MEDICATIONS LOSARTAN POTASSIUM 50 MG TABS (LOSARTAN POTASSIUM) 1 tab daily for high blood pressure WARFARIN SODIUM 2.5 MG  TABS (WARFARIN SODIUM) as directed LANOXIN 0.125 MG  TABS (DIGOXIN) Take one by mouth daily LASIX 20 MG  TABS (FUROSEMIDE) once  daily as needed SYNTHROID 88 MCG  TABS (LEVOTHYROXINE SODIUM) Take 1/2  by mouth every other day and 1 tab every other day GLUCOTROL XL 10 MG TB24 (GLIPIZIDE) Take 1 tablet by mouth once a day METFORMIN HCL 500 MG XR24H-TAB (METFORMIN HCL) 1 daily two times a day VITAMIN B-12 1000 MCG  SUBL (CYANOCOBALAMIN) WITH B6 AND FOLIC ACID LIFESCAN UNISTIK II LANCETS  MISC (LANCETS) test two times a day or as needed MIRALAX  POWD (POLYETHYLENE GLYCOL 3350) use 1 scoop every 2-3 days VITAMIN C CR 500 MG CR-TABS (ASCORBIC ACID) take 1 by mouth once daily OMEGA-3 CF 1000 MG CAPS (OMEGA-3 FATTY ACIDS) take 1 by mouth two times a day CITRACAL PETITES/VITAMIN D 200-250 MG-UNIT TABS (CALCIUM CITRATE-VITAMIN D) take 1 by mouth two times a day COENZYME Q10 60 MG TABS (COENZYME Q10) take 1 by mouth two times a day DAILY MULTI  TABS (MULTIPLE VITAMINS-MINERALS) take 1 by mouth in the morning and 1 by mouth in the evening CINNAMON 500 MG CAPS (CINNAMON) take 1 by mouth two times a day ONETOUCH ULTRA TEST  STRP (GLUCOSE BLOOD) use as directed, pt test two times a day or as needed  Dose has been reviewed with patient or caretaker during this visit.  Reviewed by: [facility]  Anticoagulation Visit Questionnaire      Coumadin dose missed/changed:  No      Abnormal Bleeding Symptoms:  No Any diet changes including alcohol intake, vegetables or greens since the last visit:  No Any illnesses or hospitalizations since the last visit:  No Any signs of clotting since the last visit (including chest discomfort, dizziness, shortness of breath, arm tingling, slurred speech, swelling or redness in leg):  No

## 2010-06-02 ENCOUNTER — Encounter: Payer: Self-pay | Admitting: Family Medicine

## 2010-06-02 ENCOUNTER — Ambulatory Visit (INDEPENDENT_AMBULATORY_CARE_PROVIDER_SITE_OTHER): Payer: PRIVATE HEALTH INSURANCE | Admitting: Family Medicine

## 2010-06-02 DIAGNOSIS — N390 Urinary tract infection, site not specified: Secondary | ICD-10-CM

## 2010-06-02 LAB — CONVERTED CEMR LAB
Nitrite: NEGATIVE
Protein, U semiquant: NEGATIVE

## 2010-06-03 ENCOUNTER — Encounter: Payer: Self-pay | Admitting: Family Medicine

## 2010-06-09 NOTE — Assessment & Plan Note (Signed)
Summary: uti/alc   Vital Signs:  Patient profile:   77 year old female Weight:      186.50 pounds Temp:     98.4 degrees F oral Pulse rate:   82 / minute Pulse rhythm:   regular BP sitting:   130 / 82  (left arm)  Vitals Entered By: Selena Batten Dance CMA Duncan Dull) (June 02, 2010 11:16 AM) CC: ? UTI   History of Present Illness: CC: UTI?  1d h/o frequency, urgency, and burning as well as abdominal discomfort.  no back pain.  No fevers/chills, nausea/vomiting.  last UTI was several years ago.  colonscopy/endoscopy last month.  Has had open sore on lip since then.  h/o HSV, uses abreva as needed.  saw optometrist for R eye pain, treated with acyclovir as well as abx eye drop.  slowly improving from that aspect  Current Medications (verified): 1)  Losartan Potassium 50 Mg Tabs (Losartan Potassium) .Marland Kitchen.. 1 Tab Daily For High Blood Pressure 2)  Warfarin Sodium 2.5 Mg  Tabs (Warfarin Sodium) .... As Directed 3)  Lanoxin 0.125 Mg  Tabs (Digoxin) .... Take One By Mouth Daily 4)  Lasix 20 Mg  Tabs (Furosemide) .... Once Daily As Needed 5)  Synthroid 88 Mcg  Tabs (Levothyroxine Sodium) .... Take 1/2  By Mouth Every Other Day and 1 Tab Every Other Day 6)  Glucotrol Xl 10 Mg Tb24 (Glipizide) .... Take 1 Tablet By Mouth Once A Day 7)  Metformin Hcl 500 Mg Xr24h-Tab (Metformin Hcl) .Marland Kitchen.. 1 Daily Two Times A Day 8)  Vitamin B-12 1000 Mcg  Subl (Cyanocobalamin) .... With B6 and Folic Acid 9)  Lifescan Unistik Ii Lancets  Misc (Lancets) .... Test Two Times A Day or As Needed 10)  Miralax  Powd (Polyethylene Glycol 3350) .... Use 1 Scoop Every 2-3 Days 11)  Vitamin C Cr 500 Mg Cr-Tabs (Ascorbic Acid) .... Take 1 By Mouth Once Daily 12)  Omega-3 Cf 1000 Mg Caps (Omega-3 Fatty Acids) .... Take 1 By Mouth Two Times A Day 13)  Calcium-Magnesium 500-250 Mg Tabs (Calcium-Magnesium) .Marland Kitchen.. 1 By Mouth Two Times A Day 14)  Coenzyme Q10 60 Mg Tabs (Coenzyme Q10) .... Take 1 By Mouth Two Times A Day 15)  Daily Multi   Tabs (Multiple Vitamins-Minerals) .... Take 1 By Mouth in The Morning and 1 By Mouth in The Evening 16)  Cinnamon 500 Mg Caps (Cinnamon) .... Take 1 By Mouth Two Times A Day 17)  Onetouch Ultra Test  Strp (Glucose Blood) .... Use As Directed, Pt Test Two Times A Day or As Needed 18)  Ciprofloxacin Hcl 500 Mg Tabs (Ciprofloxacin Hcl) .... Take One By Mouth Two Times A Day X 3 Days  Allergies: 1)  ! Penicillin V Potassium (Penicillin V Potassium) 2)  ! Codeine 3)  ! Lovastatin (Lovastatin) 4)  ! Glucophage (Metformin Hcl) 5)  ! Avandia (Rosiglitazone Maleate) 6)  ! Fosamax (Alendronate Sodium) 7)  ! Lipitor (Atorvastatin Calcium) 8)  ! 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 WOuld accept CPR but no prolonged artificial respiration. Would not want feeding tube  Review of  Systems       per HPI  Physical Exam  General:  alert and normal appearance.   Mouth:  no erythema, no exudatesm, MMM Lungs:  normal respiratory effort, no intercostal retractions, no accessory muscle use, and normal breath sounds.   Heart:  normal rate, no murmur, no gallop, and irregular rhythm.   Abdomen:  soft, and no masses.  tender to palpation suprapubically.  no CVA tenderness Pulses:  2+ rad pulses, brisk cap refill Extremities:  no sig edema   Impression & Recommendations:  Problem # 1:  UTI (ICD-599.0)  treat with cipro.  red flags to return discussed.  push  fluids, rest.  advised take coumadin 2.5mg  x 3 days then previous dose of 2.5/5 alternating days.  Her updated medication list for this problem includes:    Ciprofloxacin Hcl 500 Mg Tabs (Ciprofloxacin hcl) .Marland Kitchen... Take one by mouth two times a day x 3 days  Orders: Specimen Handling (16109) T-Culture, Urine ([id number]) UA Dipstick W/ Micro (manual) (81000)  Complete Medication List: 1)  Losartan Potassium 50 Mg Tabs (Losartan potassium) .Marland Kitchen.. 1 tab daily for high blood pressure 2)  Warfarin Sodium 2.5 Mg Tabs (Warfarin sodium) .... As directed 3)  Lanoxin 0.125 Mg Tabs (Digoxin) .... Take one by mouth daily 4)  Lasix 20 Mg Tabs (Furosemide) .... Once daily as needed 5)  Synthroid 88 Mcg Tabs (Levothyroxine sodium) .... Take 1/2  by mouth every other day and 1 tab every other day 6)  Glucotrol Xl 10 Mg Tb24 (Glipizide) .... Take 1 tablet by mouth once a day 7)  Metformin Hcl 500 Mg Xr24h-tab (Metformin hcl) .Marland Kitchen.. 1 daily two times a day 8)  Vitamin B-12 1000 Mcg Subl (Cyanocobalamin) .... With b6 and folic acid 9)  Lifescan Unistik Ii Lancets Misc (Lancets) .... Test two times a day or as needed 10)  Miralax Powd (Polyethylene glycol 3350) .... Use 1 scoop every 2-3 days 11)  Vitamin C Cr 500 Mg Cr-tabs (Ascorbic acid) .... Take 1 by mouth once daily 12)  Omega-3 Cf 1000 Mg Caps (Omega-3 fatty acids) .... Take 1 by mouth two times a day 13)  Calcium-magnesium 500-250 Mg Tabs (Calcium-magnesium) .Marland Kitchen.. 1 by mouth two times a day 14)  Coenzyme Q10 60 Mg Tabs (Coenzyme q10) .... Take 1 by mouth two times a day 15)  Daily Multi Tabs (Multiple vitamins-minerals) .... Take 1 by mouth in the morning and 1 by mouth in the evening 16)  Cinnamon 500 Mg Caps (Cinnamon) .... Take 1 by mouth two times a day 17)  Onetouch Ultra Test Strp (Glucose blood) .... Use as directed, pt test two times a day or as needed 18)  Ciprofloxacin Hcl 500 Mg Tabs (Ciprofloxacin hcl) .... Take one by mouth two times a day x  3 days  Patient Instructions: 1)  Looks like infection, treat with cipro twice daily 2)  push fluids and plenty of rest. 3)  tylenol for discomfort 4)  update [place] if not improving as expected.  good to meet you today. Prescriptions: CIPROFLOXACIN HCL 500 MG TABS (CIPROFLOXACIN HCL) take one by mouth two times a day x 3 days  #6 x 0   Entered and Authorized by:   Eustaquio Boyden  MD   Signed by:   Eustaquio Boyden  MD on 06/02/2010   Method used:   Electronically to        [facility]* (retail)       [address]  [place], [place]  [address]       Ph: [phone]       Fax: [phone]   RxID:   [id number] CIPROFLOXACIN HCL 500 MG TABS (CIPROFLOXACIN HCL) take one by mouth two times a day x 3 days  #6 x 0   Entered and Authorized by:   Eustaquio Boyden  MD   Signed by:   Eustaquio Boyden  MD on 06/02/2010   Method used:   Electronically to        [facility] [phone]* (retail)       [address] [place], [place]  [address]       Ph: [phone]       Fax: [phone]   RxID:   [id number]    Orders Added: 1)  Est. Patient Level III [23762] 2)  Specimen Handling [99000] 3)  T-Culture, Urine [[id number]] 4)  UA Dipstick W/ Micro (manual) [81000]    Current Allergies (reviewed today): ! PENICILLIN V POTASSIUM (PENICILLIN V POTASSIUM) ! CODEINE ! LOVASTATIN (LOVASTATIN) ! GLUCOPHAGE (METFORMIN HCL) ! AVANDIA (ROSIGLITAZONE MALEATE) ! FOSAMAX (ALENDRONATE SODIUM) ! LIPITOR (ATORVASTATIN CALCIUM) ! VYTORIN (EZETIMIBE-SIMVASTATIN)  Laboratory Results   Urine Tests  Date/Time Received: June 02, 2010  Date/Time Reported: June 02, 2010   Routine Urinalysis   Color: lt. yellow Appearance: Hazy Glucose: negative   (Normal Range: Negative) Bilirubin: negative   (Normal Range: Negative) Ketone: negative   (Normal Range: Negative) Spec. Gravity: 1.010   (Normal Range: 1.003-1.035) Blood: large   (Normal Range: Negative) pH: 5.0    (Normal Range: 5.0-8.0) Protein: negative   (Normal Range: Negative) Urobilinogen: 0.2   (Normal Range: 0-1) Nitrite: negative   (Normal Range: Negative) Leukocyte Esterace: small   (Normal Range: Negative)  Urine Microscopic WBC/HPF: 5-10 RBC/HPF: TNTC Bacteria/HPF: 1+ Mucous/HPF: no Epithelial/HPF: no Crystals/HPF: no Casts/LPF: no Yeast/HPF: no    Comments: read by ...........Eustaquio Boyden  MD  June 02, 2010 11:34 AM  UCx sent

## 2010-06-10 ENCOUNTER — Encounter: Payer: Self-pay | Admitting: Internal Medicine

## 2010-06-13 ENCOUNTER — Ambulatory Visit: Payer: Self-pay

## 2010-06-14 ENCOUNTER — Encounter: Payer: Self-pay | Admitting: Internal Medicine

## 2010-06-14 ENCOUNTER — Ambulatory Visit (INDEPENDENT_AMBULATORY_CARE_PROVIDER_SITE_OTHER): Payer: Medicare Other

## 2010-06-14 DIAGNOSIS — Z5181 Encounter for therapeutic drug level monitoring: Secondary | ICD-10-CM

## 2010-06-14 DIAGNOSIS — I4891 Unspecified atrial fibrillation: Secondary | ICD-10-CM

## 2010-06-14 DIAGNOSIS — Z7901 Long term (current) use of anticoagulants: Secondary | ICD-10-CM

## 2010-06-14 LAB — CONVERTED CEMR LAB
INR: 2
Prothrombin Time: 24.5 s

## 2010-06-21 NOTE — Medication Information (Signed)
Summary: [facility]   PCP: Cindee Salt MD Indication 1: Atrial fibrillation PT 24.5 INR RANGE 2.0-3.5           Allergies: 1)  ! Penicillin V Potassium (Penicillin V Potassium) 2)  ! Codeine 3)  ! Lovastatin (Lovastatin) 4)  ! Glucophage (Metformin Hcl) 5)  ! Avandia (Rosiglitazone Maleate) 6)  ! Fosamax (Alendronate Sodium) 7)  ! Lipitor (Atorvastatin Calcium) 8)  ! Vytorin (Ezetimibe-Simvastatin)  Anticoagulation Management History:      Positive risk factors for bleeding include an age of 75 years or older and presence of serious comorbidities.  The bleeding index is 'intermediate risk'.  Positive CHADS2 values include History of HTN, Age > 75 years old, and History of Diabetes.  Her last INR was 2.1 and today's INR is 2.0.  Prothrombin time is 24.5.    Anticoagulation Management Assessment/Plan:      The patient's current anticoagulation dose is Warfarin sodium 2.5 mg  tabs: as directed.  The next INR is due 4 weeks.        Laboratory Results   Blood Tests   Date/Time Recieved: June 14, 2010 12:13 PM  Date/Time Reported: June 14, 2010 12:13 PM   PT: 24.5 s   (Normal Range: 10.6-13.4)  INR: 2.0   (Normal Range: 0.88-1.12   Therap INR: 2.0-3.5)      ANTICOAGULATION RECORD PREVIOUS REGIMEN & LAB RESULTS Anticoagulation Diagnosis:  Atrial fibrillation on  04/30/2009 Previous INR Goal Range:  2.0-3.5 on  04/30/2009 Previous INR:  2.1 on  05/10/2010 Previous Coumadin Dose(mg):  5/2.5 on  04/04/2010 Previous Regimen:  5/2.5 on  04/04/2010 Previous Coagulation Comments:  . on  04/29/2008  NEW REGIMEN & LAB RESULTS Current INR: 2.0 Regimen: 5/2.5  (no change)  Provider: Laverne Klugh      Repeat testing in: 4 weeks MEDICATIONS LOSARTAN POTASSIUM 50 MG TABS (LOSARTAN POTASSIUM) 1 tab daily for high blood pressure WARFARIN SODIUM 2.5 MG  TABS (WARFARIN SODIUM) as directed LANOXIN 0.125 MG  TABS (DIGOXIN) Take one by mouth daily LASIX 20 MG   TABS (FUROSEMIDE) once daily as needed SYNTHROID 88 MCG  TABS (LEVOTHYROXINE SODIUM) Take 1/2  by mouth every other day and 1 tab every other day GLUCOTROL XL 10 MG TB24 (GLIPIZIDE) Take 1 tablet by mouth once a day METFORMIN HCL 500 MG XR24H-TAB (METFORMIN HCL) 1 daily two times a day VITAMIN B-12 1000 MCG  SUBL (CYANOCOBALAMIN) WITH B6 AND FOLIC ACID LIFESCAN UNISTIK II LANCETS  MISC (LANCETS) test two times a day or as needed MIRALAX  POWD (POLYETHYLENE GLYCOL 3350) use 1 scoop every 2-3 days VITAMIN C CR 500 MG CR-TABS (ASCORBIC ACID) take 1 by mouth once daily OMEGA-3 CF 1000 MG CAPS (OMEGA-3 FATTY ACIDS) take 1 by mouth two times a day CALCIUM-MAGNESIUM 500-250 MG TABS (CALCIUM-MAGNESIUM) 1 by mouth two times a day COENZYME Q10 60 MG TABS (COENZYME Q10) take 1 by mouth two times a day DAILY MULTI  TABS (MULTIPLE VITAMINS-MINERALS) take 1 by mouth in the morning and 1 by mouth in the evening CINNAMON 500 MG CAPS (CINNAMON) take 1 by mouth two times a day ONETOUCH ULTRA TEST  STRP (GLUCOSE BLOOD) use as directed, pt test two times a day or as needed CIPROFLOXACIN HCL 500 MG TABS (CIPROFLOXACIN HCL) take one by mouth two times a day x 3 days  Dose has been reviewed with patient or caretaker during this visit.  Reviewed by: Allison Quarry  Anticoagulation Visit Questionnaire  Coumadin dose missed/changed:  No      Abnormal Bleeding Symptoms:  No Any diet changes including alcohol intake, vegetables or greens since the last visit:  No Any illnesses or hospitalizations since the last visit:  No Any signs of clotting since the last visit (including chest discomfort, dizziness, shortness of breath, arm tingling, slurred speech, swelling or redness in leg):  No

## 2010-06-21 NOTE — Letter (Signed)
Summary: Eye Exam/D. Janee Morn, O.D.   Eye Exam/D. Janee Morn, O.D.   Imported By: Maryln Gottron 06/17/2010 15:30:15  _____________________________________________________________________  External Attachment:    Type:   Image     Comment:   External Document  Appended Document: Eye Exam/D. Janee Morn, O.D.     Clinical Lists Changes  Observations: Added new observation of DIAB EYE EX: Mild non-proliferative diabetic retinopathy.    (05/06/2010 20:48)       Diabetic Eye Exam  Procedure date:  05/06/2010  Findings:      Mild non-proliferative diabetic retinopathy.

## 2010-07-12 ENCOUNTER — Ambulatory Visit: Payer: Medicare Other

## 2010-07-13 ENCOUNTER — Telehealth: Payer: Self-pay | Admitting: Radiology

## 2010-07-13 ENCOUNTER — Ambulatory Visit (INDEPENDENT_AMBULATORY_CARE_PROVIDER_SITE_OTHER): Payer: Medicare Other | Admitting: Internal Medicine

## 2010-07-13 DIAGNOSIS — I4891 Unspecified atrial fibrillation: Secondary | ICD-10-CM

## 2010-07-13 DIAGNOSIS — Z5181 Encounter for therapeutic drug level monitoring: Secondary | ICD-10-CM

## 2010-07-13 DIAGNOSIS — Z7901 Long term (current) use of anticoagulants: Secondary | ICD-10-CM

## 2010-07-13 LAB — POCT INR: INR: 1.9

## 2010-07-13 NOTE — Patient Instructions (Signed)
No changes

## 2010-07-13 NOTE — Telephone Encounter (Signed)
Order for INR

## 2010-08-02 ENCOUNTER — Other Ambulatory Visit: Payer: Self-pay | Admitting: *Deleted

## 2010-08-02 MED ORDER — FUROSEMIDE 20 MG PO TABS: 20.0000 mg | ORAL_TABLET | Freq: Every day | ORAL | Status: DC | PRN

## 2010-08-03 ENCOUNTER — Telehealth: Payer: Self-pay | Admitting: Radiology

## 2010-08-03 DIAGNOSIS — Z5181 Encounter for therapeutic drug level monitoring: Secondary | ICD-10-CM

## 2010-08-03 DIAGNOSIS — Z7901 Long term (current) use of anticoagulants: Secondary | ICD-10-CM

## 2010-08-03 DIAGNOSIS — I4891 Unspecified atrial fibrillation: Secondary | ICD-10-CM

## 2010-08-03 NOTE — Telephone Encounter (Signed)
Order for INR

## 2010-08-10 ENCOUNTER — Ambulatory Visit: Payer: Medicare Other

## 2010-08-12 ENCOUNTER — Ambulatory Visit: Payer: Medicare Other

## 2010-08-15 ENCOUNTER — Ambulatory Visit (INDEPENDENT_AMBULATORY_CARE_PROVIDER_SITE_OTHER): Payer: Medicare Other | Admitting: Internal Medicine

## 2010-08-15 DIAGNOSIS — I4891 Unspecified atrial fibrillation: Secondary | ICD-10-CM

## 2010-08-15 DIAGNOSIS — Z5181 Encounter for therapeutic drug level monitoring: Secondary | ICD-10-CM

## 2010-08-15 DIAGNOSIS — Z7901 Long term (current) use of anticoagulants: Secondary | ICD-10-CM

## 2010-08-29 ENCOUNTER — Other Ambulatory Visit: Payer: Self-pay | Admitting: *Deleted

## 2010-08-29 MED ORDER — GLIPIZIDE ER 10 MG PO TB24: ORAL_TABLET | ORAL | Status: DC

## 2010-08-31 ENCOUNTER — Other Ambulatory Visit: Payer: Medicare Other

## 2010-09-02 ENCOUNTER — Other Ambulatory Visit: Payer: Medicare Other

## 2010-09-02 ENCOUNTER — Ambulatory Visit (INDEPENDENT_AMBULATORY_CARE_PROVIDER_SITE_OTHER): Payer: Medicare Other | Admitting: Internal Medicine

## 2010-09-02 DIAGNOSIS — Z5181 Encounter for therapeutic drug level monitoring: Secondary | ICD-10-CM

## 2010-09-02 DIAGNOSIS — Z7901 Long term (current) use of anticoagulants: Secondary | ICD-10-CM

## 2010-09-02 DIAGNOSIS — I4891 Unspecified atrial fibrillation: Secondary | ICD-10-CM

## 2010-09-02 LAB — POCT INR: INR: 3.4

## 2010-09-05 NOTE — Patient Instructions (Signed)
Continue current dose and return in 2 weeks.  

## 2010-09-20 ENCOUNTER — Ambulatory Visit (INDEPENDENT_AMBULATORY_CARE_PROVIDER_SITE_OTHER): Payer: Medicare Other | Admitting: Internal Medicine

## 2010-09-20 DIAGNOSIS — Z7901 Long term (current) use of anticoagulants: Secondary | ICD-10-CM

## 2010-09-20 DIAGNOSIS — Z5181 Encounter for therapeutic drug level monitoring: Secondary | ICD-10-CM

## 2010-09-20 DIAGNOSIS — I4891 Unspecified atrial fibrillation: Secondary | ICD-10-CM

## 2010-09-20 LAB — POCT INR: INR: 2.9

## 2010-09-26 ENCOUNTER — Ambulatory Visit: Payer: Self-pay | Admitting: Gynecologic Oncology

## 2010-10-05 ENCOUNTER — Other Ambulatory Visit: Payer: Self-pay | Admitting: *Deleted

## 2010-10-05 MED ORDER — LOSARTAN POTASSIUM 50 MG PO TABS: 50.0000 mg | ORAL_TABLET | Freq: Every day | ORAL | Status: DC

## 2010-10-18 ENCOUNTER — Ambulatory Visit: Payer: Medicare Other

## 2010-10-19 ENCOUNTER — Ambulatory Visit (INDEPENDENT_AMBULATORY_CARE_PROVIDER_SITE_OTHER): Payer: Medicare Other | Admitting: Internal Medicine

## 2010-10-19 ENCOUNTER — Ambulatory Visit: Payer: Medicare Other

## 2010-10-19 DIAGNOSIS — Z7901 Long term (current) use of anticoagulants: Secondary | ICD-10-CM

## 2010-10-19 DIAGNOSIS — I4891 Unspecified atrial fibrillation: Secondary | ICD-10-CM

## 2010-10-19 DIAGNOSIS — Z5181 Encounter for therapeutic drug level monitoring: Secondary | ICD-10-CM

## 2010-10-19 NOTE — Patient Instructions (Signed)
Dose was listed as 5 mg daily, 2.5 mg wed,sat, pt states she has been alternating 5/2.5 mg. I advised she continue current dose

## 2010-11-10 ENCOUNTER — Encounter: Payer: Self-pay | Admitting: Cardiovascular Disease

## 2010-11-14 ENCOUNTER — Ambulatory Visit (INDEPENDENT_AMBULATORY_CARE_PROVIDER_SITE_OTHER): Payer: Medicare Other | Admitting: Cardiovascular Disease

## 2010-11-14 ENCOUNTER — Encounter: Payer: Self-pay | Admitting: Cardiovascular Disease

## 2010-11-14 VITALS — BP 164/107 | HR 97 | Resp 16 | Wt 188.0 lb

## 2010-11-14 DIAGNOSIS — I10 Essential (primary) hypertension: Secondary | ICD-10-CM

## 2010-11-14 DIAGNOSIS — E785 Hyperlipidemia, unspecified: Secondary | ICD-10-CM

## 2010-11-14 DIAGNOSIS — R609 Edema, unspecified: Secondary | ICD-10-CM

## 2010-11-14 DIAGNOSIS — I4891 Unspecified atrial fibrillation: Secondary | ICD-10-CM

## 2010-11-14 DIAGNOSIS — E119 Type 2 diabetes mellitus without complications: Secondary | ICD-10-CM

## 2010-11-14 DIAGNOSIS — R42 Dizziness and giddiness: Secondary | ICD-10-CM

## 2010-11-14 NOTE — Assessment & Plan Note (Signed)
Blood pressure is elevated today she reports it is typically elevated in the doctor's office. We have asked her to monitor her blood pressure at home. She reports that she owns 2 blood pressure cuffs. She does not like taking medications in general I suspect she is compliant with her medications currently.

## 2010-11-14 NOTE — Patient Instructions (Addendum)
You are doing well. No medication changes were made. Please monitor your heart rate at home Call the office for elevated heart rate.  Please call us if you have new issues that need to be addressed before your next appt.  We will call you for a follow up Appt. In 2 months.

## 2010-11-14 NOTE — Assessment & Plan Note (Signed)
We have encouraged her to watch her weight, diet in an effort to improve her hemoglobin A1c.

## 2010-11-14 NOTE — Assessment & Plan Note (Signed)
Etiology of her lightheadedness is uncertain. We have suggested we check a digoxin level. She would like to do this at the same time we check a cholesterol. She is not fasting today. She will let us know when she would like to have this done.

## 2010-11-14 NOTE — Assessment & Plan Note (Addendum)
We did discuss her cholesterol with her. It is elevated for a known diabetic. She will try red yeast rice as she does not want a statin.

## 2010-11-14 NOTE — Assessment & Plan Note (Signed)
Her heart rate is mildly elevated today at rest. We have suggested she monitor her heart rate closely at home. If she continues to have mildly elevated heart rates, we could substitute one of her blood pressure medications for a beta blocker.

## 2010-11-14 NOTE — Progress Notes (Addendum)
Patient ID: Jodi Reeves, female    DOB: [birth year], 77 y.o.   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 She reports that she had a cardiologist in the past though has not had followup with cardiology in many years.  She reports that many years ago, she had significant workup including echocardiography and cardiac catheterization. No intervention was needed. She has felt well and typically has good energy. 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  She does complain of balance issues. She describes it as a lightheadedness. It seems to happen when she starts walking after she has been active, symptoms go away. She describes it as a fogginess in her head and believes it could be from one of the medications.   She believes she has a thyroid problem that has caused weight gain. She had amiodarone many years ago and she believes that this caused her thyroid problem. She has difficulty getting her diabetes numbers any better. She has been compliant on her warfarin though she does not like taking it.  We had a long discussion on her heart rate and she believes her heart rate is well controlled at home and she is not tachycardic. She does not have any particular symptoms of shortness of breath with exertion or tachycardia.  EKG shows atrial fibrillation with ventricular rate 97 beats per minute, nonspecific ST changes in the anterolateral leads including V6, I and aVL   Outpatient Encounter Prescriptions as of 11/14/2010  Medication Sig Dispense Refill  . ascorbic Acid (VITAMIN C CR) 500 MG CPCR Take 500 mg by mouth daily.        . Calcium-Magnesium 500-250 MG TABS Take 1 tablet by mouth 2 (two) times daily.        . Coenzyme Q10 60 MG TABS Take 1 tablet  by mouth 2 (two) times daily.        . digoxin (LANOXIN) 0.125 MG tablet Take 125 mcg by mouth daily.        . furosemide (LASIX) 20 MG tablet Take 1 tablet (20 mg total) by mouth daily as needed. Take 1 tablet by mouth once daily as needed  30 tablet  11  . glipiZIDE (GLIPIZIDE XL) 10 MG 24 hr tablet Take 1 tablet by mouth once daily  30 tablet  11  . glucose blood test strip 1 each by Other route as directed. onetouch ultra test strip       . Lancets MISC by Does not apply route 2 (two) times daily. Lifescan Unistik II       . levothyroxine (SYNTHROID, LEVOTHROID) 88 MCG tablet Take 88 mcg by mouth daily. 1/2 tab every other day and 1 tab every other day       . losartan (COZAAR) 50 MG tablet Take 1 tablet (50 mg total) by mouth daily.  30 tablet  6  . metFORMIN (GLUMETZA) 500 MG (MOD) 24 hr tablet Take 500 mg by mouth 2 (two) times daily.        . Multiple Vitamin (MULTIVITAMIN) tablet Take 1 tablet by mouth 2 (two) times daily. 1 in am and 1 in evening       . Multiple Vitamins-Minerals (EYE-VITE EXTRA PLUS LUTEIN PO) Take by mouth as directed.        Marland Kitchen  Omega-3 Fatty Acids (OMEGA-3 CF) 1000 MG CAPS Take 1 capsule by mouth 2 (two) times daily.        . polyethylene glycol powder (MIRALAX) powder Take 17 g by mouth every other day.        . vitamin B-12 (CYANOCOBALAMIN) 1000 MCG tablet Take 1,000 mcg by mouth daily. With B6 and folic acid       . warfarin (COUMADIN) 2.5 MG tablet Take 2.5 mg by mouth as directed.           Review of Systems  Constitutional: Negative.   HENT: Negative.   Eyes: Negative.   Respiratory: Negative.   Cardiovascular: Positive for leg swelling.  Gastrointestinal: Negative.   Musculoskeletal: Negative.   Skin: Negative.   Neurological: Positive for light-headedness.  Hematological: Negative.   Psychiatric/Behavioral: Negative.   All other systems reviewed and are negative.    BP 164/107  Pulse 97  Resp 16  Wt 188 lb (85.276 kg) She claims her BP is  always elevated in MDs office.  Physical Exam  Nursing note and vitals reviewed. Constitutional: She is oriented to person, place, and time. She appears well-developed and well-nourished.  HENT:  Head: Normocephalic.  Nose: Nose normal.  Mouth/Throat: Oropharynx is clear and moist.  Eyes: Conjunctivae are normal. Pupils are equal, round, and reactive to light.  Neck: Normal range of motion. Neck supple. No JVD present.  Cardiovascular: Normal rate, S1 normal, S2 normal, normal heart sounds and intact distal pulses.  An irregularly irregular rhythm present. Exam reveals no gallop and no friction rub.   No murmur heard.      Nonpitting edema with varicosities noted to below the knee bilaterally.  Pulmonary/Chest: Effort normal and breath sounds normal. No respiratory distress. She has no wheezes. She has no rales. She exhibits no tenderness.  Abdominal: Soft. Bowel sounds are normal. She exhibits no distension. There is no tenderness.  Musculoskeletal: Normal range of motion. She exhibits no edema and no tenderness.  Lymphadenopathy:    She has no cervical adenopathy.  Neurological: She is alert and oriented to person, place, and time. Coordination normal.  Skin: Skin is warm and dry. No rash noted. No erythema.  Psychiatric: She has a normal mood and affect. Her behavior is normal. Judgment and thought content normal.         Assessment and Plan

## 2010-11-14 NOTE — Assessment & Plan Note (Signed)
Her nonpitting edema is likely secondary to venous insufficiency possibly to mild fluid overload from diastolic dysfunction. She does have atrial fibrillation which could contribute to mild fluid retention. If her heart rate continues to be elevated, this would also contribute to fluid retention. We have suggested she take Lasix as needed for edema.

## 2010-11-15 ENCOUNTER — Ambulatory Visit: Payer: Medicare Other

## 2010-11-24 ENCOUNTER — Ambulatory Visit (INDEPENDENT_AMBULATORY_CARE_PROVIDER_SITE_OTHER): Payer: Medicare Other | Admitting: Family Medicine

## 2010-11-24 DIAGNOSIS — Z7901 Long term (current) use of anticoagulants: Secondary | ICD-10-CM

## 2010-11-24 DIAGNOSIS — Z5181 Encounter for therapeutic drug level monitoring: Secondary | ICD-10-CM

## 2010-11-24 DIAGNOSIS — I4891 Unspecified atrial fibrillation: Secondary | ICD-10-CM

## 2010-11-24 NOTE — Patient Instructions (Signed)
2.5 mg daily, except 5 mg MWF. Recheck in 2 weeks

## 2010-11-30 ENCOUNTER — Other Ambulatory Visit: Payer: Self-pay | Admitting: *Deleted

## 2010-11-30 MED ORDER — METFORMIN HCL ER 500 MG PO TB24: 500.0000 mg | ORAL_TABLET | Freq: Two times a day (BID) | ORAL | Status: DC

## 2010-11-30 NOTE — Telephone Encounter (Signed)
rx sent to pharmacy by e-script  

## 2010-12-02 ENCOUNTER — Telehealth: Payer: Self-pay | Admitting: *Deleted

## 2010-12-02 NOTE — Telephone Encounter (Signed)
Patient says she is having some problems with her BP elevated, BS elevated and A1C elevated.  She is also having some swelling of her feet and legs.  She is taking Lasix daily and it is really prescribed only as needed.   She made an appt with Dr. Alphonsus Sias when she was in for labs today but it is scheduled for August 24th.  That was the first available follow up appt.  Is it okay to get her worked in sooner than that and if so, can you tell us when it would be good to do so?  Or do we need to make her an appt with one of the other physicians?

## 2010-12-04 NOTE — Telephone Encounter (Signed)
11:15 or 12:15 on Wednesday or I will see 1:30 and 1:45 on THursday I expect to stay and add on Friday afternoon as well

## 2010-12-06 ENCOUNTER — Encounter: Payer: Self-pay | Admitting: Internal Medicine

## 2010-12-06 NOTE — Telephone Encounter (Signed)
Pt scheduled appt 12:15 wednesday

## 2010-12-07 ENCOUNTER — Encounter: Payer: Self-pay | Admitting: Internal Medicine

## 2010-12-07 ENCOUNTER — Ambulatory Visit (INDEPENDENT_AMBULATORY_CARE_PROVIDER_SITE_OTHER): Payer: Medicare Other | Admitting: Internal Medicine

## 2010-12-07 DIAGNOSIS — I4891 Unspecified atrial fibrillation: Secondary | ICD-10-CM

## 2010-12-07 DIAGNOSIS — I10 Essential (primary) hypertension: Secondary | ICD-10-CM

## 2010-12-07 DIAGNOSIS — E119 Type 2 diabetes mellitus without complications: Secondary | ICD-10-CM

## 2010-12-07 MED ORDER — BISOPROLOL-HYDROCHLOROTHIAZIDE 2.5-6.25 MG PO TABS: 1.0000 | ORAL_TABLET | Freq: Every day | ORAL | Status: DC

## 2010-12-07 MED ORDER — GLUCOSE BLOOD VI STRP: 1.0000 | ORAL_STRIP | Freq: Two times a day (BID) | Status: DC | PRN

## 2010-12-07 NOTE — Patient Instructions (Signed)
Please start the new medicine to slow heart and reduce blood pressure. It may help your fluid in the legs but you can still use the furosemide if you need to

## 2010-12-07 NOTE — Progress Notes (Signed)
Subjective:    Patient ID: Jodi Reeves, female    DOB: [birth year], 77 y.o.   MRN: [id number]  HPI Has noticed BP being up regularly Went to optometrist for routine visit---noted flame hemorrhages Saw Dr Mariah Milling then BP up there and HR but no other worrisome findings  BP running up 174/105 at DR Alusio's office recently 142/94, 148/80, 129/76 with larger cuff Pulse 90-100 124/81, 135/103 but pulse in low 100's  Doesn't have any palpitations occ chest pain--but on the right side and at rest (under shoulder blades though) Breathing has been okay  Has been needing lasix every day lately But has been on her feet more at outdoor drama show  Sugars still on high side 150-200 fasting Lab Results  Component Value Date   HGBA1C 8.3* 05/10/2010     Current Outpatient Prescriptions on File Prior to Visit  Medication Sig Dispense Refill  . Coenzyme Q10 60 MG TABS Take 1 tablet by mouth 2 (two) times daily.        . digoxin (LANOXIN) 0.125 MG tablet Take 125 mcg by mouth daily.        . furosemide (LASIX) 20 MG tablet Take 1 tablet (20 mg total) by mouth daily as needed. Take 1 tablet by mouth once daily as needed  30 tablet  11  . glipiZIDE (GLIPIZIDE XL) 10 MG 24 hr tablet Take 1 tablet by mouth once daily  30 tablet  11  . Lancets MISC by Does not apply route 2 (two) times daily. Lifescan Unistik II       . levothyroxine (SYNTHROID, LEVOTHROID) 88 MCG tablet Take 88 mcg by mouth daily. 1/2 tab every other day and 1 tab every other day       . losartan (COZAAR) 50 MG tablet Take 1 tablet (50 mg total) by mouth daily.  30 tablet  6  . metFORMIN (GLUCOPHAGE-XR) 500 MG 24 hr tablet Take 1 tablet (500 mg total) by mouth 2 (two) times daily.  60 tablet  11  . Multiple Vitamin (MULTIVITAMIN) tablet Take 1 tablet by mouth 2 (two) times daily. 1 in am and 1 in evening       . Multiple Vitamins-Minerals (EYE-VITE EXTRA PLUS LUTEIN PO) Take by mouth as directed.        . Omega-3 Fatty Acids  (OMEGA-3 CF) 1000 MG CAPS Take 1 capsule by mouth 2 (two) times daily.        . polyethylene glycol powder (MIRALAX) powder Take 17 g by mouth as needed.       . warfarin (COUMADIN) 2.5 MG tablet Take 2.5 mg by mouth as directed.          Allergies  Allergen Reactions  . Alendronate Sodium     REACTION: Rash  . Atorvastatin     REACTION: Muscle aches  . Codeine     REACTION: N \\T \ V  . Ezetimibe-Simvastatin     REACTION: Muscle aches  . Lovastatin     REACTION: Myalgias  . Penicillins     REACTION: Anaphylaxis  . Rosiglitazone Maleate     REACTION: Edema    Past Medical History  Diagnosis Date  . Atrial fibrillation   . DM2 (diabetes mellitus, type 2)   . HLD (hyperlipidemia)   . HTN (hypertension)   . Hypothyroidism   . Osteoporosis   . Anxiety   . GERD (gastroesophageal reflux disease)     esophageal web  . Ovarian cancer   . Granuloma annulare  Dr. Jarold Motto.     Past Surgical History  Procedure Date  . Femoral hernia repair 1958    R  . Stillbirth 1969  . Laminectomy l4-5 1971  . Total abdominal hysterectomy w/ bilateral salpingoophorectomy 1980    ovarian cancer  . 2nd look laparotomy 1982  . Cholecystectomy 1986  . Kidney stone x2 1991  . Fracture l elbow/wrist 2000  . Intussception/obstruction 12/98  . Wrist fracture surgery 11/05    R  . Cardiac catheterization 8/05    neg; a fib found   . Cataract od 2002  . Myoview stress 8/05    syncope (-)  . Egd/dilation/colon 1/06  . Dexa 9/04 and 1/02  . Cataract surgery 5/07    R  . Abdominal hysterectomy     No family history on file.  History   Social History  . Marital Status: Married    Spouse Name: N/A    Number of Children: 1  . Years of Education: N/A   Occupational History  . 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 Smoking status: Never Smoker   . Smokeless tobacco: Never Used  . Alcohol Use: No  . Drug Use: No  . Sexually Active: Not on file   Other Topics  Concern  . Not on file   Social History Narrative   Married, 1 son.Does not get regular exercise - unable to [facility], then FNP.Son is healthcare POA; would accept CPR but not prolonged artificial response; would not want feeding tube.      Review of Systems Sleeps okay. Nocturia x 2 No orthpnea or PND    Objective:   Physical Exam  Constitutional: She appears well-developed and well-nourished. No distress.  Neck: Normal range of motion. No thyromegaly present.  Cardiovascular: Normal heart sounds and intact distal pulses.  Exam reveals no gallop.   No murmur heard.      Irregular and tachycardic  Pulmonary/Chest: Effort normal and breath sounds normal. No respiratory distress. She has no wheezes. She has no rales.  Musculoskeletal:       1+ non pitting edema  Lymphadenopathy:    She has no cervical adenopathy.  Psychiatric: She has a normal mood and affect. Her behavior is normal. Judgment and thought content normal.          Assessment & Plan:

## 2010-12-07 NOTE — Assessment & Plan Note (Signed)
Lab Results  Component Value Date   HGBA1C 8.3* 05/10/2010   suboptimal control Will consider adding med Check labs at next visit

## 2010-12-07 NOTE — Assessment & Plan Note (Signed)
Has rapid heart rate May be part of why her edema is worse and her feeling less energy Will try adding bisoprolol in low dose

## 2010-12-07 NOTE — Assessment & Plan Note (Signed)
BP Readings from Last 3 Encounters:  12/07/10 146/103  11/14/10 164/107  06/02/10 130/82   Mildly elevated Will add med

## 2010-12-12 ENCOUNTER — Ambulatory Visit: Payer: Medicare Other

## 2010-12-13 ENCOUNTER — Ambulatory Visit (INDEPENDENT_AMBULATORY_CARE_PROVIDER_SITE_OTHER): Payer: Medicare Other | Admitting: Internal Medicine

## 2010-12-13 DIAGNOSIS — I4891 Unspecified atrial fibrillation: Secondary | ICD-10-CM

## 2010-12-13 DIAGNOSIS — Z7901 Long term (current) use of anticoagulants: Secondary | ICD-10-CM

## 2010-12-13 DIAGNOSIS — Z5181 Encounter for therapeutic drug level monitoring: Secondary | ICD-10-CM

## 2010-12-13 NOTE — Patient Instructions (Signed)
2.5 mg daily, except 5 mg M/F. Recheck in 2 weeks(reduced 2.5 mg weekly)

## 2010-12-16 ENCOUNTER — Ambulatory Visit (INDEPENDENT_AMBULATORY_CARE_PROVIDER_SITE_OTHER): Payer: Medicare Other | Admitting: Internal Medicine

## 2010-12-16 ENCOUNTER — Encounter: Payer: Self-pay | Admitting: Internal Medicine

## 2010-12-16 DIAGNOSIS — I4891 Unspecified atrial fibrillation: Secondary | ICD-10-CM

## 2010-12-16 DIAGNOSIS — I10 Essential (primary) hypertension: Secondary | ICD-10-CM

## 2010-12-16 DIAGNOSIS — R609 Edema, unspecified: Secondary | ICD-10-CM

## 2010-12-16 DIAGNOSIS — E119 Type 2 diabetes mellitus without complications: Secondary | ICD-10-CM

## 2010-12-16 LAB — CBC WITH DIFFERENTIAL/PLATELET
Basophils Relative: 0.4 % (ref 0.0–3.0)
Eosinophils Relative: 1.7 % (ref 0.0–5.0)
Hemoglobin: 13.9 g/dL (ref 12.0–15.0)
Lymphocytes Relative: 28.8 % (ref 12.0–46.0)
MCHC: 34 g/dL (ref 30.0–36.0)
MCV: 98 fl (ref 78.0–100.0)
Monocytes Absolute: 0.4 10*3/uL (ref 0.1–1.0)
Neutro Abs: 4 10*3/uL (ref 1.4–7.7)
Neutrophils Relative %: 63.2 % (ref 43.0–77.0)
RBC: 4.17 Mil/uL (ref 3.87–5.11)
WBC: 6.4 10*3/uL (ref 4.5–10.5)

## 2010-12-16 LAB — BASIC METABOLIC PANEL
BUN: 18 mg/dL (ref 6–23)
GFR: 92.89 mL/min (ref >60.00)
Glucose, Bld: 157 mg/dL — ABNORMAL HIGH (ref 70–99)
Potassium: 4.5 mEq/L (ref 3.5–5.1)

## 2010-12-16 LAB — HEMOGLOBIN A1C: Hgb A1c MFr Bld: 8.9 % — ABNORMAL HIGH (ref 4.6–6.5)

## 2010-12-16 NOTE — Progress Notes (Signed)
Subjective:    Patient ID: Jodi Reeves, female    DOB: 1927-06-14, 77 y.o.   MRN: [id number]  HPI Turned from kitchen sink to wash counter---tripped on open dishwasher yesterday Wound up on floor Reinjured right leg after it had been improving Scraped left leg also Has some soreness in hips also but doesn't think it is serious  BP is better on the new med Did have some dizziness, nausea and gas with the med. Throat seemed itchy and scratchy This has seemed to pass after a couple of days 126/75, 112/65, 123/90 etc Pulse in 80's usually  Hasn't noticed much change in energy as yet No chest pain No palpitations Leg problems have limited walking--hasn't noted dyspnea though  Checking sugars every few days Lately 180-212 fasting No hypoglycemia  Current Outpatient Prescriptions on File Prior to Visit  Medication Sig Dispense Refill  . Ascorbic Acid (VITAMIN C) 1000 MG tablet Take 1,000 mg by mouth daily.        . bisoprolol-hydrochlorothiazide (ZIAC) 2.5-6.25 MG per tablet Take 1 tablet by mouth daily.  30 tablet  11  . Calcium-Magnesium-Zinc 1000-500-50 MG TABS Take by mouth daily.        . Coenzyme Q10 60 MG TABS Take 1 tablet by mouth 2 (two) times daily.        . digoxin (LANOXIN) 0.125 MG tablet Take 125 mcg by mouth daily.        . Folic Acid-Vit B6-Vit B12 (FA-VITAMIN B-6-VITAMIN B-12) 2.2-25-0.5 MG TABS Place under the tongue daily.        . furosemide (LASIX) 20 MG tablet Take 1 tablet (20 mg total) by mouth daily as needed. Take 1 tablet by mouth once daily as needed  30 tablet  11  . glipiZIDE (GLIPIZIDE XL) 10 MG 24 hr tablet Take 1 tablet by mouth once daily  30 tablet  11  . glucose blood (ONE TOUCH ULTRA TEST) test strip 1 each by Other route 2 (two) times daily as needed for other.  100 each  6  . Lancets MISC by Does not apply route 2 (two) times daily. Lifescan Unistik II       . levothyroxine (SYNTHROID, LEVOTHROID) 88 MCG tablet Take 88 mcg by mouth daily. 1/2  tab every other day and 1 tab every other day       . losartan (COZAAR) 50 MG tablet Take 1 tablet (50 mg total) by mouth daily.  30 tablet  6  . metFORMIN (GLUCOPHAGE-XR) 500 MG 24 hr tablet Take 1 tablet (500 mg total) by mouth 2 (two) times daily.  60 tablet  11  . Multiple Vitamin (MULTIVITAMIN) tablet Take 1 tablet by mouth 2 (two) times daily. 1 in am and 1 in evening       . Multiple Vitamins-Minerals (EYE-VITE EXTRA PLUS LUTEIN PO) Take by mouth as directed.        . Omega-3 Fatty Acids (OMEGA-3 CF) 1000 MG CAPS Take 1 capsule by mouth 2 (two) times daily.        . polyethylene glycol powder (MIRALAX) powder Take 17 g by mouth as needed.       . warfarin (COUMADIN) 2.5 MG tablet Take 2.5 mg by mouth as directed.          Allergies  Allergen Reactions  . Alendronate Sodium     REACTION: Rash  . Atorvastatin     REACTION: Muscle aches  . Codeine     REACTION: N \\T \ V  .  Ezetimibe-Simvastatin     REACTION: Muscle aches  . Lovastatin     REACTION: Myalgias  . Penicillins     REACTION: Anaphylaxis  . Rosiglitazone Maleate     REACTION: Edema    Past Medical History  Diagnosis Date  . Atrial fibrillation   . DM2 (diabetes mellitus, type 2)   . HLD (hyperlipidemia)   . HTN (hypertension)   . Hypothyroidism   . Osteoporosis   . Anxiety   . GERD (gastroesophageal reflux disease)     esophageal web  . Ovarian cancer   . Granuloma annulare     Dr. Jarold Motto.     Past Surgical History  Procedure Date  . Femoral hernia repair 1958    R  . Stillbirth 1969  . Laminectomy l4-5 1971  . Total abdominal hysterectomy w/ bilateral salpingoophorectomy 1980    ovarian cancer  . 2nd look laparotomy 1982  . Cholecystectomy 1986  . Kidney stone x2 1991  . Fracture l elbow/wrist 2000  . Intussception/obstruction 12/98  . Wrist fracture surgery 11/05    R  . Cardiac catheterization 8/05    neg; a fib found   . Cataract od 2002  . Myoview stress 8/05    syncope (-)  .  Egd/dilation/colon 1/06  . Dexa 9/04 and 1/02  . Cataract surgery 5/07    R  . Abdominal hysterectomy     No family history on file.  History   Social History  . Marital Status: Married    Spouse Name: N/A    Number of Children: 1  . Years of Education: N/A   Occupational History  . 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 Smoking status: Never Smoker   . Smokeless tobacco: Never Used  . Alcohol Use: No  . Drug Use: No  . Sexually Active: Not on file   Other Topics Concern  . Not on file   Social History Narrative   Married, 1 son.Does not get regular exercise - unable to [facility], then FNP.Son is healthcare POA; would accept CPR but not prolonged artificial response; would not want feeding tube.    Review of Systems Has had some stool urgency---was an issue in past and a couple of recent episodes Had to defer PT after fall--rescheduled for next week Edema is better---using furosemide just about daily    Objective:   Physical Exam  Constitutional: She appears well-developed and well-nourished. No distress.  Neck: Normal range of motion. Neck supple.  Cardiovascular: Normal rate and normal heart sounds.  Exam reveals no gallop.   No murmur heard.      irregular  Pulmonary/Chest: Effort normal and breath sounds normal. No respiratory distress. She has no wheezes. She has no rales.  Musculoskeletal:       Thick legs without true edema now  Lymphadenopathy:    She has no cervical adenopathy.  Psychiatric: She has a normal mood and affect. Her behavior is normal. Judgment and thought content normal.          Assessment & Plan:

## 2010-12-16 NOTE — Assessment & Plan Note (Signed)
Control has not been good Will start lantus if A1c>9% now and may consider later anyway. 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

## 2010-12-16 NOTE — Assessment & Plan Note (Signed)
BP Readings from Last 3 Encounters:  12/16/10 148/85  12/07/10 146/103  11/14/10 164/107   Much better with ziac Will continue Check labs

## 2010-12-16 NOTE — Assessment & Plan Note (Signed)
Better Okay to use the furosemide every day if necessary

## 2010-12-16 NOTE — Assessment & Plan Note (Signed)
Rate now in 70's and 80's No sig side effects Continue bisoprolol Check dig level

## 2010-12-27 ENCOUNTER — Ambulatory Visit: Payer: Medicare Other

## 2010-12-29 ENCOUNTER — Ambulatory Visit (INDEPENDENT_AMBULATORY_CARE_PROVIDER_SITE_OTHER): Payer: Medicare Other | Admitting: Internal Medicine

## 2010-12-29 DIAGNOSIS — Z7901 Long term (current) use of anticoagulants: Secondary | ICD-10-CM

## 2010-12-29 DIAGNOSIS — Z5181 Encounter for therapeutic drug level monitoring: Secondary | ICD-10-CM

## 2010-12-29 DIAGNOSIS — I4891 Unspecified atrial fibrillation: Secondary | ICD-10-CM

## 2010-12-29 NOTE — Patient Instructions (Signed)
Continue 2.5 mg daily, except 5 mg M/ W/F. Recheck in 2 weeks

## 2010-12-30 ENCOUNTER — Telehealth: Payer: Self-pay | Admitting: *Deleted

## 2010-12-30 NOTE — Telephone Encounter (Signed)
Patient advised as instructed via telephone she stated that she is not sure that changing medications is what she should do.  The Bisoprolol/HCTZ is working well for her otherwise, and she wanted to know if should could continue taking it and see what happens.  She stated that she would hate to change medications and it "messes" up everything else that is going good like her BP and her blood sugar.

## 2010-12-30 NOTE — Telephone Encounter (Signed)
Pt states she has had a problem with diarrhea since starting bisoprolol. Says she cant go anywhere without having to go to the bathroom.  She has some lower abd pain with this.  The medicine is helping her BP, it has come down, but she says she cant deal with the diarrhea.  Also, her blood sugar has been running around 200 lately.

## 2010-12-30 NOTE — Telephone Encounter (Signed)
I think it is unlikely that the new medication was causing the diarrhea but it is possible It would be fine for her to wait a little longer and see if her intestines settle down---then she wouldn't have to change  Have her continue the current med for now but she should call next week if she is not better

## 2010-12-30 NOTE — Telephone Encounter (Signed)
Have her stop the bisoprolol/HCTZ and send Rx for metoprolol succinate 25mg  daily (#30 x 11) and see if she does better with that

## 2010-12-30 NOTE — Telephone Encounter (Signed)
Patient notified as instructed by telephone. 

## 2011-01-12 ENCOUNTER — Encounter: Payer: Self-pay | Admitting: *Deleted

## 2011-01-12 ENCOUNTER — Ambulatory Visit (INDEPENDENT_AMBULATORY_CARE_PROVIDER_SITE_OTHER): Payer: Medicare Other | Admitting: Internal Medicine

## 2011-01-12 DIAGNOSIS — Z5181 Encounter for therapeutic drug level monitoring: Secondary | ICD-10-CM

## 2011-01-12 DIAGNOSIS — Z7901 Long term (current) use of anticoagulants: Secondary | ICD-10-CM

## 2011-01-12 DIAGNOSIS — I4891 Unspecified atrial fibrillation: Secondary | ICD-10-CM

## 2011-01-12 LAB — POCT INR: INR: 2.5

## 2011-01-12 NOTE — Patient Instructions (Signed)
Continue 2.5 mg daily, except 5 mg M/ W/F. Recheck in 4 weeks

## 2011-01-13 ENCOUNTER — Encounter: Payer: Self-pay | Admitting: Cardiovascular Disease

## 2011-01-16 ENCOUNTER — Ambulatory Visit: Payer: Medicare Other | Admitting: Cardiovascular Disease

## 2011-01-19 ENCOUNTER — Telehealth: Payer: Self-pay | Admitting: *Deleted

## 2011-01-19 NOTE — Telephone Encounter (Signed)
I am interested in her heart rate. She is in atrial fib. At her last visit, heart rate was mildly elevated. If she can measure her heart rate? Does she have a monitor?  Is she taking red yeast rice?

## 2011-01-19 NOTE — Telephone Encounter (Signed)
Pt had called on 9/24 stating she had to cancel her f/u with Dr. Mariah Milling. She did, however, see Dr. Alphonsus Sias few weeks prior and asks if Dr. Mariah Milling can review and we can let her know if he recc she f/u sooner rather than later. She has no c/o at this time. Please advise.

## 2011-01-23 NOTE — Telephone Encounter (Signed)
Attempted to contact pt, LMOM TCB.  

## 2011-01-24 ENCOUNTER — Other Ambulatory Visit: Payer: Self-pay | Admitting: *Deleted

## 2011-01-24 NOTE — Telephone Encounter (Signed)
Spoke to pt, she said since Dr. Alphonsus Sias decr her lasix to prn and started bisoprolol-hctz early August '12, her HR has been 70s-80s, highest is 84. Prior to that HR 100-108. BP 105-120s/60s-70 ave. She has been taking red yeast rice along with Omega-3 for about 2-3 months now. She has no complaints at this time, (just had a death in family), so I told pt we can put in reminder for her next f/u and she may call back with any changes in the meantime. Pt ok with this. Dr. Mariah Milling when do you suggest she f/u again?

## 2011-01-29 NOTE — Telephone Encounter (Signed)
i can see her back once a year

## 2011-01-30 NOTE — Telephone Encounter (Signed)
LMOM can see back in one year but if she has any problems between now & then, she can call the office to be seen sooner.  She is on the recall to follow up in one year.

## 2011-02-09 ENCOUNTER — Ambulatory Visit (INDEPENDENT_AMBULATORY_CARE_PROVIDER_SITE_OTHER): Payer: Medicare Other | Admitting: Internal Medicine

## 2011-02-09 DIAGNOSIS — Z5181 Encounter for therapeutic drug level monitoring: Secondary | ICD-10-CM

## 2011-02-09 DIAGNOSIS — Z7901 Long term (current) use of anticoagulants: Secondary | ICD-10-CM

## 2011-02-09 DIAGNOSIS — I4891 Unspecified atrial fibrillation: Secondary | ICD-10-CM

## 2011-02-09 LAB — POCT INR: INR: 2.7

## 2011-02-09 NOTE — Patient Instructions (Signed)
Continue 2.5 mg daily, except 5 mg M/ W/F. Recheck in 4 weeks 

## 2011-02-27 ENCOUNTER — Other Ambulatory Visit: Payer: Self-pay | Admitting: *Deleted

## 2011-02-27 MED ORDER — WARFARIN SODIUM 2.5 MG PO TABS: 2.5000 mg | ORAL_TABLET | ORAL | Status: DC

## 2011-03-09 ENCOUNTER — Ambulatory Visit: Payer: Medicare Other

## 2011-03-10 ENCOUNTER — Ambulatory Visit (INDEPENDENT_AMBULATORY_CARE_PROVIDER_SITE_OTHER): Payer: Medicare Other | Admitting: Internal Medicine

## 2011-03-10 DIAGNOSIS — Z5181 Encounter for therapeutic drug level monitoring: Secondary | ICD-10-CM

## 2011-03-10 DIAGNOSIS — Z7901 Long term (current) use of anticoagulants: Secondary | ICD-10-CM

## 2011-03-10 DIAGNOSIS — I4891 Unspecified atrial fibrillation: Secondary | ICD-10-CM

## 2011-03-10 NOTE — Patient Instructions (Signed)
Continue 2.5 mg daily, except 5 mg M/ W/F. Recheck in 4 weeks 

## 2011-03-24 ENCOUNTER — Ambulatory Visit (INDEPENDENT_AMBULATORY_CARE_PROVIDER_SITE_OTHER): Payer: Medicare Other | Admitting: Internal Medicine

## 2011-03-24 ENCOUNTER — Encounter: Payer: Self-pay | Admitting: Internal Medicine

## 2011-03-24 VITALS — BP 147/87 | HR 84 | Temp 99.0°F | Resp 16 | Ht 63.0 in | Wt 183.0 lb

## 2011-03-24 DIAGNOSIS — J019 Acute sinusitis, unspecified: Secondary | ICD-10-CM

## 2011-03-24 MED ORDER — AZITHROMYCIN 250 MG PO TABS: ORAL_TABLET | ORAL | Status: AC

## 2011-03-24 NOTE — Progress Notes (Signed)
Subjective:    Patient ID: Jodi Reeves, female    DOB: Aug 26, 1927, 77 y.o.   MRN: [id number]  HPI Sick for 5-6 days Started with sore throat and laryngitis Quickly went into head with thick yellow drainage and rhinorrhea Some cough, trouble coughing  Feels she coughs some yellow sputum from chest also No clear cut fever No chills or sweats  Not SOB  Some ear discomfort in right ear---not new Some headache at first  Current Outpatient Prescriptions on File Prior to Visit  Medication Sig Dispense Refill  . Ascorbic Acid (VITAMIN C) 1000 MG tablet Take 1,000 mg by mouth daily.        . bisoprolol-hydrochlorothiazide (ZIAC) 2.5-6.25 MG per tablet Take 1 tablet by mouth daily.  30 tablet  11  . Calcium-Magnesium-Zinc 1000-500-50 MG TABS Take by mouth daily.        . Coenzyme Q10 60 MG TABS Take 1 tablet by mouth 2 (two) times daily.        . digoxin (LANOXIN) 0.125 MG tablet Take 125 mcg by mouth daily.        . Folic Acid-Vit B6-Vit B12 (FA-VITAMIN B-6-VITAMIN B-12) 2.2-25-0.5 MG TABS Place under the tongue daily.        . furosemide (LASIX) 20 MG tablet Take 1 tablet (20 mg total) by mouth daily as needed. Take 1 tablet by mouth once daily as needed  30 tablet  11  . glipiZIDE (GLIPIZIDE XL) 10 MG 24 hr tablet Take 1 tablet by mouth once daily  30 tablet  11  . glucose blood (ONE TOUCH ULTRA TEST) test strip 1 each by Other route 2 (two) times daily as needed for other.  100 each  6  . Lancets MISC by Does not apply route 2 (two) times daily. Lifescan Unistik II       . levothyroxine (SYNTHROID, LEVOTHROID) 88 MCG tablet Take 88 mcg by mouth daily. 1/2 tab every other day and 1 tab every other day       . losartan (COZAAR) 50 MG tablet Take 1 tablet (50 mg total) by mouth daily.  30 tablet  6  . Multiple Vitamin (MULTIVITAMIN) tablet Take 1 tablet by mouth 2 (two) times daily. 1 in am and 1 in evening       . Multiple Vitamins-Minerals (EYE-VITE EXTRA PLUS LUTEIN PO) Take by mouth as  directed.        . Omega-3 Fatty Acids (OMEGA-3 CF) 1000 MG CAPS Take 1 capsule by mouth 2 (two) times daily.        . polyethylene glycol powder (MIRALAX) powder Take 17 g by mouth as needed.       . warfarin (COUMADIN) 2.5 MG tablet Take 1 tablet (2.5 mg total) by mouth as directed.  45 tablet  11    Allergies  Allergen Reactions  . Alendronate Sodium     REACTION: Rash  . Atorvastatin     REACTION: Muscle aches  . Codeine     REACTION: N \\T \ V  . Ezetimibe-Simvastatin     REACTION: Muscle aches  . Lovastatin     REACTION: Myalgias  . Penicillins     REACTION: Anaphylaxis  . Rosiglitazone Maleate     REACTION: Edema    Past Medical History  Diagnosis Date  . Atrial fibrillation   . DM2 (diabetes mellitus, type 2)   . HLD (hyperlipidemia)   . HTN (hypertension)   . Hypothyroidism   . Osteoporosis   . Anxiety   .  GERD (gastroesophageal reflux disease)     esophageal web  . Ovarian cancer   . Granuloma annulare     Dr. Jarold Motto.     Past Surgical History  Procedure Date  . Femoral hernia repair 1958    R  . Stillbirth 1969  . Laminectomy l4-5 1971  . Total abdominal hysterectomy w/ bilateral salpingoophorectomy 1980    ovarian cancer  . 2nd look laparotomy 1982  . Cholecystectomy 1986  . Kidney stone x2 1991  . Fracture l elbow/wrist 2000  . Intussception/obstruction 12/98  . Wrist fracture surgery 11/05    R  . Cardiac catheterization 8/05    neg; a fib found   . Cataract od 2002  . Myoview stress 8/05    syncope (-)  . Egd/dilation/colon 1/06  . Dexa 9/04 and 1/02  . Cataract surgery 5/07    R  . Abdominal hysterectomy     No family history on file.  History   Social History  . Marital Status: Married    Spouse Name: N/A    Number of Children: 1  . Years of Education: N/A   Occupational History  . 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 Smoking status: Never Smoker   . Smokeless tobacco: Never Used  . Alcohol Use: No    . Drug Use: No  . Sexually Active: Not on file   Other Topics Concern  . Not on file   Social History Narrative   Married, 1 son.Does not get regular exercise - unable to [facility], then FNP.Son is healthcare POA; would accept CPR but not prolonged artificial response; would not want feeding tube.    Review of Systems No rash No nausea or vomiting Appetite is off    Objective:   Physical Exam  Constitutional: She appears well-developed and well-nourished. No distress.       Mildly uncomfortable Hoarse Coarse cough  HENT:  Mouth/Throat: Oropharynx is clear and moist. No oropharyngeal exudate.       No sinus tenderness Marked right nasal inflammation  Neck: Normal range of motion.  Pulmonary/Chest: Effort normal and breath sounds normal. No respiratory distress. She has no wheezes. She has no rales.  Lymphadenopathy:    She has no cervical adenopathy.          Assessment & Plan:

## 2011-03-24 NOTE — Patient Instructions (Signed)
Please set up protime for Monday or Tuesday --due to the antibiotic use

## 2011-03-24 NOTE — Assessment & Plan Note (Signed)
Seems to have started with viral illness Now some sinus symptoms She is concerned due to history of pneumonia in past  Will go ahead with z-pak Check protime early next week

## 2011-03-31 ENCOUNTER — Ambulatory Visit: Payer: Medicare Other

## 2011-04-03 ENCOUNTER — Ambulatory Visit (INDEPENDENT_AMBULATORY_CARE_PROVIDER_SITE_OTHER): Payer: Medicare Other | Admitting: Internal Medicine

## 2011-04-03 DIAGNOSIS — Z5181 Encounter for therapeutic drug level monitoring: Secondary | ICD-10-CM

## 2011-04-03 DIAGNOSIS — I4891 Unspecified atrial fibrillation: Secondary | ICD-10-CM

## 2011-04-03 DIAGNOSIS — Z7901 Long term (current) use of anticoagulants: Secondary | ICD-10-CM

## 2011-04-03 NOTE — Patient Instructions (Signed)
Continue current dose, check in 4 weeks  

## 2011-04-07 ENCOUNTER — Ambulatory Visit: Payer: Medicare Other

## 2011-04-20 ENCOUNTER — Other Ambulatory Visit: Payer: Self-pay | Admitting: *Deleted

## 2011-04-20 MED ORDER — DIGOXIN 125 MCG PO TABS: 125.0000 ug | ORAL_TABLET | Freq: Every day | ORAL | Status: DC

## 2011-05-01 ENCOUNTER — Ambulatory Visit (INDEPENDENT_AMBULATORY_CARE_PROVIDER_SITE_OTHER): Payer: Medicare Other | Admitting: Internal Medicine

## 2011-05-01 DIAGNOSIS — Z7901 Long term (current) use of anticoagulants: Secondary | ICD-10-CM

## 2011-05-01 DIAGNOSIS — Z5181 Encounter for therapeutic drug level monitoring: Secondary | ICD-10-CM

## 2011-05-01 NOTE — Patient Instructions (Signed)
Continue current dose, check in 4 weeks  

## 2011-05-18 ENCOUNTER — Ambulatory Visit: Payer: Self-pay | Admitting: Internal Medicine

## 2011-05-26 ENCOUNTER — Ambulatory Visit: Payer: Self-pay | Admitting: Internal Medicine

## 2011-05-29 ENCOUNTER — Ambulatory Visit (INDEPENDENT_AMBULATORY_CARE_PROVIDER_SITE_OTHER): Payer: Medicare Other | Admitting: Internal Medicine

## 2011-05-29 DIAGNOSIS — Z7901 Long term (current) use of anticoagulants: Secondary | ICD-10-CM

## 2011-05-29 DIAGNOSIS — Z5181 Encounter for therapeutic drug level monitoring: Secondary | ICD-10-CM

## 2011-05-29 DIAGNOSIS — I4891 Unspecified atrial fibrillation: Secondary | ICD-10-CM

## 2011-05-29 NOTE — Patient Instructions (Signed)
Continue 2.5 mg Tues, Th, Sun, 5 daily mg Recheck in 2 weeks

## 2011-06-06 ENCOUNTER — Other Ambulatory Visit: Payer: Self-pay | Admitting: *Deleted

## 2011-06-06 MED ORDER — LEVOTHYROXINE SODIUM 88 MCG PO TABS: ORAL_TABLET | ORAL | Status: DC

## 2011-06-12 ENCOUNTER — Ambulatory Visit (INDEPENDENT_AMBULATORY_CARE_PROVIDER_SITE_OTHER): Payer: Medicare Other | Admitting: Internal Medicine

## 2011-06-12 DIAGNOSIS — Z5181 Encounter for therapeutic drug level monitoring: Secondary | ICD-10-CM

## 2011-06-12 DIAGNOSIS — Z7901 Long term (current) use of anticoagulants: Secondary | ICD-10-CM

## 2011-06-12 DIAGNOSIS — I4891 Unspecified atrial fibrillation: Secondary | ICD-10-CM

## 2011-06-12 NOTE — Patient Instructions (Signed)
Continue current dose, check in 4 weeks  

## 2011-06-19 ENCOUNTER — Other Ambulatory Visit: Payer: Self-pay | Admitting: *Deleted

## 2011-06-19 MED ORDER — LOSARTAN POTASSIUM 50 MG PO TABS: 50.0000 mg | ORAL_TABLET | Freq: Every day | ORAL | Status: DC

## 2011-06-26 ENCOUNTER — Ambulatory Visit: Payer: Medicare Other

## 2011-06-27 ENCOUNTER — Other Ambulatory Visit: Payer: Self-pay | Admitting: *Deleted

## 2011-06-27 MED ORDER — DIGOXIN 125 MCG PO TABS: 125.0000 ug | ORAL_TABLET | Freq: Every day | ORAL | Status: DC

## 2011-06-27 NOTE — Telephone Encounter (Signed)
Okay to refill for a year Can go electronically

## 2011-06-27 NOTE — Telephone Encounter (Signed)
rx sent to pharmacy by e-script  

## 2011-07-10 ENCOUNTER — Ambulatory Visit (INDEPENDENT_AMBULATORY_CARE_PROVIDER_SITE_OTHER): Payer: Medicare Other | Admitting: Internal Medicine

## 2011-07-10 DIAGNOSIS — Z5181 Encounter for therapeutic drug level monitoring: Secondary | ICD-10-CM

## 2011-07-10 DIAGNOSIS — Z7901 Long term (current) use of anticoagulants: Secondary | ICD-10-CM

## 2011-07-10 DIAGNOSIS — I4891 Unspecified atrial fibrillation: Secondary | ICD-10-CM

## 2011-07-10 LAB — POCT INR: INR: 2.4

## 2011-07-10 NOTE — Patient Instructions (Signed)
Continue current dose, check in 4 weeks  

## 2011-08-07 ENCOUNTER — Ambulatory Visit (INDEPENDENT_AMBULATORY_CARE_PROVIDER_SITE_OTHER): Payer: Medicare Other | Admitting: Internal Medicine

## 2011-08-07 DIAGNOSIS — I4891 Unspecified atrial fibrillation: Secondary | ICD-10-CM

## 2011-08-07 DIAGNOSIS — Z5181 Encounter for therapeutic drug level monitoring: Secondary | ICD-10-CM

## 2011-08-07 DIAGNOSIS — Z7901 Long term (current) use of anticoagulants: Secondary | ICD-10-CM

## 2011-08-07 LAB — POCT INR: INR: 3.2

## 2011-08-07 NOTE — Patient Instructions (Signed)
Continue 5 mg daily, 2.5 mg T,Thur, Sun, 2 weeks

## 2011-08-21 ENCOUNTER — Ambulatory Visit (INDEPENDENT_AMBULATORY_CARE_PROVIDER_SITE_OTHER): Payer: Medicare Other | Admitting: Internal Medicine

## 2011-08-21 DIAGNOSIS — Z5181 Encounter for therapeutic drug level monitoring: Secondary | ICD-10-CM

## 2011-08-21 DIAGNOSIS — Z7901 Long term (current) use of anticoagulants: Secondary | ICD-10-CM

## 2011-08-21 DIAGNOSIS — I4891 Unspecified atrial fibrillation: Secondary | ICD-10-CM

## 2011-08-21 LAB — POCT INR: INR: 2.2

## 2011-08-21 NOTE — Patient Instructions (Signed)
Continue current dose, check in 4 weeks  

## 2011-08-24 ENCOUNTER — Other Ambulatory Visit: Payer: Self-pay | Admitting: *Deleted

## 2011-08-24 MED ORDER — GLIPIZIDE ER 10 MG PO TB24: ORAL_TABLET | ORAL | Status: DC

## 2011-08-30 ENCOUNTER — Other Ambulatory Visit: Payer: Self-pay | Admitting: *Deleted

## 2011-08-30 MED ORDER — FUROSEMIDE 20 MG PO TABS: 20.0000 mg | ORAL_TABLET | Freq: Every day | ORAL | Status: DC

## 2011-09-21 ENCOUNTER — Ambulatory Visit: Payer: Medicare Other

## 2011-09-26 ENCOUNTER — Ambulatory Visit (INDEPENDENT_AMBULATORY_CARE_PROVIDER_SITE_OTHER): Payer: Medicare Other | Admitting: Internal Medicine

## 2011-09-26 DIAGNOSIS — Z7901 Long term (current) use of anticoagulants: Secondary | ICD-10-CM

## 2011-09-26 DIAGNOSIS — Z5181 Encounter for therapeutic drug level monitoring: Secondary | ICD-10-CM

## 2011-09-26 DIAGNOSIS — I4891 Unspecified atrial fibrillation: Secondary | ICD-10-CM

## 2011-09-26 LAB — POCT INR: INR: 2.6

## 2011-09-26 NOTE — Patient Instructions (Signed)
Continue current dose, check in 4 weeks  

## 2011-10-24 ENCOUNTER — Ambulatory Visit: Payer: Medicare Other

## 2011-10-25 ENCOUNTER — Ambulatory Visit (INDEPENDENT_AMBULATORY_CARE_PROVIDER_SITE_OTHER): Payer: Medicare Other | Admitting: Internal Medicine

## 2011-10-25 DIAGNOSIS — Z7901 Long term (current) use of anticoagulants: Secondary | ICD-10-CM

## 2011-10-25 DIAGNOSIS — I4891 Unspecified atrial fibrillation: Secondary | ICD-10-CM

## 2011-10-25 DIAGNOSIS — Z5181 Encounter for therapeutic drug level monitoring: Secondary | ICD-10-CM

## 2011-10-25 LAB — POCT INR: INR: 2.1

## 2011-10-25 NOTE — Patient Instructions (Signed)
Continue 5 mg daily, except 2.5 mg T,Thur, Sun recheck 4 weeks

## 2011-10-31 ENCOUNTER — Encounter: Payer: Self-pay | Admitting: Internal Medicine

## 2011-10-31 ENCOUNTER — Ambulatory Visit: Payer: Self-pay | Admitting: Internal Medicine

## 2011-11-01 ENCOUNTER — Other Ambulatory Visit: Payer: Self-pay | Admitting: *Deleted

## 2011-11-01 MED ORDER — BISOPROLOL-HYDROCHLOROTHIAZIDE 2.5-6.25 MG PO TABS: 1.0000 | ORAL_TABLET | Freq: Every day | ORAL | Status: DC

## 2011-11-06 ENCOUNTER — Encounter: Payer: Self-pay | Admitting: *Deleted

## 2011-11-17 ENCOUNTER — Encounter: Payer: Self-pay | Admitting: Cardiovascular Disease

## 2011-11-17 ENCOUNTER — Ambulatory Visit (INDEPENDENT_AMBULATORY_CARE_PROVIDER_SITE_OTHER): Payer: Medicare Other | Admitting: Cardiovascular Disease

## 2011-11-17 VITALS — BP 120/80 | HR 67 | Ht 63.5 in | Wt 174.5 lb

## 2011-11-17 DIAGNOSIS — I4891 Unspecified atrial fibrillation: Secondary | ICD-10-CM

## 2011-11-17 DIAGNOSIS — R609 Edema, unspecified: Secondary | ICD-10-CM

## 2011-11-17 DIAGNOSIS — E785 Hyperlipidemia, unspecified: Secondary | ICD-10-CM

## 2011-11-17 DIAGNOSIS — R079 Chest pain, unspecified: Secondary | ICD-10-CM

## 2011-11-17 DIAGNOSIS — I10 Essential (primary) hypertension: Secondary | ICD-10-CM

## 2011-11-17 NOTE — Progress Notes (Signed)
Patient ID: Jodi Reeves, female    DOB: October 20, 1927, 78 y.o.   MRN: [id number]  HPI Comments: 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 No intervention was needed.   She has felt well and typically has good energy. She helps to run a theatre in [facility] Among other things. She does report having neck pain and back pain last night. Pain was initially on the left around her scapula and then radiated to the right. She took Tylenol and naproxen and her symptoms seem to resolve. She does go to the fitness center 2 times per week. Reports that her sugars have been relatively well controlled. Rarely, she has low blood pressure. She wears compression hose frequently for nonpitting edema, likely venous insufficiency.    She had amiodarone many years ago and she believes that this caused her thyroid problem. She has been compliant on her warfarin though she does not like taking it.  EKG shows atrial fibrillation with rate 67 beats per minute, no significant ST or T wave changes   Outpatient Encounter Prescriptions as of 11/14/2010  Medication Sig Dispense Refill  . ascorbic Acid (VITAMIN C CR) 500 MG CPCR Take 500 mg by mouth daily.        . Calcium-Magnesium 500-250 MG TABS Take 1 tablet by mouth 2 (two) times daily.        . Coenzyme Q10 60 MG TABS Take 1 tablet by mouth 2 (two) times daily.        . digoxin (LANOXIN) 0.125 MG tablet Take 125 mcg by mouth daily.        . furosemide (LASIX) 20 MG tablet Take 1 tablet (20 mg total) by mouth daily as needed. Take 1 tablet by mouth once daily as needed  30 tablet  11  . glipiZIDE (GLIPIZIDE XL) 10 MG 24 hr tablet Take 1 tablet by mouth once daily  30 tablet  11  . glucose blood test strip 1 each by Other route as directed.  onetouch ultra test strip       . Lancets MISC by Does not apply route 2 (two) times daily. Lifescan Unistik II       . levothyroxine (SYNTHROID, LEVOTHROID) 88 MCG tablet Take 88 mcg by mouth daily. 1/2 tab every other day and 1 tab every other day       . losartan (COZAAR) 50 MG tablet Take 1 tablet (50 mg total) by mouth daily.  30 tablet  6  . metFORMIN (GLUMETZA) 500 MG (MOD) 24 hr tablet Take 500 mg by mouth 2 (two) times daily.        . Multiple Vitamin (MULTIVITAMIN) tablet Take 1 tablet by mouth 2 (two) times daily. 1 in am and 1 in evening       . Multiple Vitamins-Minerals (EYE-VITE EXTRA PLUS LUTEIN PO) Take by mouth as directed.        . Omega-3 Fatty Acids (OMEGA-3 CF) 1000 MG CAPS Take 1 capsule by mouth 2 (two) times daily.        . polyethylene glycol powder (MIRALAX) powder Take 17 g by mouth every other day.        . vitamin B-12 (CYANOCOBALAMIN) 1000 MCG tablet Take 1,000 mcg by mouth daily. With B6 and folic acid       . warfarin (COUMADIN)  2.5 MG tablet Take 2.5 mg by mouth as directed.           Review of Systems  Constitutional: Negative.   HENT: Negative.   Eyes: Negative.   Respiratory: Negative.   Gastrointestinal: Negative.   Musculoskeletal: Positive for myalgias and back pain.  Skin: Negative.   Hematological: Negative.   Psychiatric/Behavioral: Negative.   All other systems reviewed and are negative.    BP 120/80  Pulse 67  Ht 5' 3.5" (1.613 m)  Wt 174 lb 8 oz (79.153 kg)  BMI 30.43 kg/m2  Physical Exam  Nursing note and vitals reviewed. Constitutional: She is oriented to person, place, and time. She appears well-developed and well-nourished.  HENT:  Head: Normocephalic.  Nose: Nose normal.  Mouth/Throat: Oropharynx is clear and moist.  Eyes: Conjunctivae are normal. Pupils are equal, round, and reactive to light.  Neck: Normal range of motion. Neck supple. No JVD present.  Cardiovascular: Normal rate, S1 normal, S2 normal, normal heart sounds  and intact distal pulses.  An irregularly irregular rhythm present. Exam reveals no gallop and no friction rub.   No murmur heard.      Nonpitting edema with varicosities noted to below the knee bilaterally.  Pulmonary/Chest: Effort normal and breath sounds normal. No respiratory distress. She has no wheezes. She has no rales. She exhibits no tenderness.  Abdominal: Soft. Bowel sounds are normal. She exhibits no distension. There is no tenderness.  Musculoskeletal: Normal range of motion. She exhibits no edema and no tenderness.  Lymphadenopathy:    She has no cervical adenopathy.  Neurological: She is alert and oriented to person, place, and time. Coordination normal.  Skin: Skin is warm and dry. No rash noted. No erythema.  Psychiatric: She has a normal mood and affect. Her behavior is normal. Judgment and thought content normal.         Assessment and Plan

## 2011-11-17 NOTE — Assessment & Plan Note (Signed)
Blood pressure is well controlled on today's visit. No changes made to the medications. 

## 2011-11-17 NOTE — Assessment & Plan Note (Signed)
Heart rate is relatively well controlled on her current medication regimen. Tolerating warfarin.

## 2011-11-17 NOTE — Assessment & Plan Note (Addendum)
It has been 2 years since her last cholesterol. We will check her cholesterol today. High risk of coronary and peripheral vascular disease given poorly controlled diabetes and hyperlipidemia. She may benefit from more aggressive management.

## 2011-11-17 NOTE — Patient Instructions (Addendum)
You are doing well. No medication changes were made. Continue to monitor your blood pressure For back pain, take naproxen  Please call us if you have new issues that need to be addressed before your next appt.  Your physician wants you to follow-up in: 6 months.  You will receive a reminder letter in the mail two months in advance. If you don't receive a letter, please call our office to schedule the follow-up appointment.

## 2011-11-17 NOTE — Assessment & Plan Note (Signed)
Edema likely secondary to venous insufficiency. Nonpitting. She wears compression hose.

## 2011-11-18 LAB — LIPID PANEL
Chol/HDL Ratio: 3.3 ratio units (ref 0.0–4.4)
HDL: 56 mg/dL (ref >39)
LDL Calculated: 104 mg/dL — ABNORMAL HIGH (ref 0–99)
Triglycerides: 134 mg/dL (ref 0–149)
VLDL Cholesterol Cal: 27 mg/dL (ref 5–40)

## 2011-11-18 LAB — HEPATIC FUNCTION PANEL
ALT: 21 IU/L (ref 0–32)
Bilirubin, Direct: 0.19 mg/dL (ref 0.00–0.40)
Total Protein: 6.8 g/dL (ref 6.0–8.5)

## 2011-11-21 ENCOUNTER — Other Ambulatory Visit: Payer: Self-pay

## 2011-11-21 ENCOUNTER — Ambulatory Visit (INDEPENDENT_AMBULATORY_CARE_PROVIDER_SITE_OTHER): Payer: Medicare Other | Admitting: Internal Medicine

## 2011-11-21 DIAGNOSIS — Z7901 Long term (current) use of anticoagulants: Secondary | ICD-10-CM

## 2011-11-21 DIAGNOSIS — Z5181 Encounter for therapeutic drug level monitoring: Secondary | ICD-10-CM

## 2011-11-21 DIAGNOSIS — I4891 Unspecified atrial fibrillation: Secondary | ICD-10-CM

## 2011-11-21 LAB — POCT INR: INR: 2.4

## 2011-11-21 NOTE — Patient Instructions (Signed)
Continue current dose, check in 4 weeks  

## 2011-11-22 ENCOUNTER — Other Ambulatory Visit: Payer: Self-pay

## 2011-11-22 MED ORDER — COLESEVELAM HCL 625 MG PO TABS: ORAL_TABLET | ORAL | Status: DC

## 2011-11-29 ENCOUNTER — Ambulatory Visit: Payer: Medicare Other | Admitting: Internal Medicine

## 2011-12-05 ENCOUNTER — Encounter: Payer: Self-pay | Admitting: Internal Medicine

## 2011-12-05 ENCOUNTER — Ambulatory Visit (INDEPENDENT_AMBULATORY_CARE_PROVIDER_SITE_OTHER): Payer: Medicare Other | Admitting: Internal Medicine

## 2011-12-05 VITALS — BP 122/68 | HR 78 | Temp 98.2°F | Ht 63.0 in | Wt 170.0 lb

## 2011-12-05 DIAGNOSIS — E785 Hyperlipidemia, unspecified: Secondary | ICD-10-CM

## 2011-12-05 DIAGNOSIS — E119 Type 2 diabetes mellitus without complications: Secondary | ICD-10-CM

## 2011-12-05 DIAGNOSIS — I4891 Unspecified atrial fibrillation: Secondary | ICD-10-CM

## 2011-12-05 DIAGNOSIS — I10 Essential (primary) hypertension: Secondary | ICD-10-CM

## 2011-12-05 NOTE — Assessment & Plan Note (Signed)
Much better control Dr Renae Fickle is managing Will try to get her labs and notes

## 2011-12-05 NOTE — Patient Instructions (Signed)
Please try holding the losartan. Monitor your blood pressure and see if it helps your neck and lightheaded feeling. Please restart it if your BP is consistently over 140/90

## 2011-12-05 NOTE — Assessment & Plan Note (Signed)
Good rate control On dig and coumadin

## 2011-12-05 NOTE — Assessment & Plan Note (Signed)
BP Readings from Last 3 Encounters:  12/05/11 122/68  11/17/11 120/80  03/24/11 147/87   Tends to run low lately Having dizziness  Will try holding the losartan and have her monitor the BP and see if it helps symptoms

## 2011-12-05 NOTE — Assessment & Plan Note (Signed)
Has been intolerant of multiple meds Not comfortable with the wellchol either LDL 104---close to goal without Rx

## 2011-12-05 NOTE — Progress Notes (Signed)
Subjective:    Patient ID: Jodi Reeves, female    DOB: 01/24/1928, 78 y.o.   MRN: [id number]  HPI Here for follow up  Recent visit with Dr Mariah Milling Had been having some chest pain but didn't seem to be heart related Shoulder pain also and throbbing quality. Then moved to the right side Thought it might have been from her cervical spine Has pain in back of her neck also---limited ROM Has tried heat Tried 1 naprosyn and tylenol--not really helping but did improve Had considered chiropractic  Cholesterol levels pretty good---LDL 104 Rx for wellchol but she decided not to take this after reading about possible interactions  Monitors BP  Low at times---systolic under 100 at times Head "feels so bad" some times---not sure if it is related to the BP Not really dizzy but is lightheaded at times  Sees Dr Renae Fickle for the diabetes now Last A1c reportedly 7.2% Really careful with carbs 2 mild hypoglycemic spells Usually 120 fasting though  Current Outpatient Prescriptions on File Prior to Visit  Medication Sig Dispense Refill  . Ascorbic Acid (VITAMIN C) 1000 MG tablet Take 1,000 mg by mouth daily.        . BD PEN NEEDLE NANO U/F 32G X 4 MM MISC Inject 1 Syringe as directed as needed.       . bisoprolol-hydrochlorothiazide (ZIAC) 2.5-6.25 MG per tablet Take 1 tablet by mouth daily.  30 tablet  11  . Calcium Carbonate-Vit D-Min (SM CALCIUM/VITAMIN D3 PO) Take 1 tablet by mouth daily.      . Coenzyme Q10 60 MG TABS Take 1 tablet by mouth 2 (two) times daily.        . digoxin (LANOXIN) 0.125 MG tablet Take 1 tablet (125 mcg total) by mouth daily.  30 tablet  11  . Folic Acid-Vit B6-Vit B12 (FA-VITAMIN B-6-VITAMIN B-12) 2.2-25-0.5 MG TABS Place under the tongue daily.        . furosemide (LASIX) 20 MG tablet Take 1 tablet (20 mg total) by mouth daily.  30 tablet  11  . glipiZIDE (GLIPIZIDE XL) 10 MG 24 hr tablet Take 1 tablet by mouth once daily  30 tablet  11  . glucose blood (ONE TOUCH ULTRA  TEST) test strip 1 each by Other route 2 (two) times daily as needed for other.  100 each  6  . Lancets MISC by Does not apply route 2 (two) times daily. Lifescan Unistik II       . levothyroxine (SYNTHROID, LEVOTHROID) 88 MCG tablet Take 1 whole tablet by mouth EVERY OTHER DAY and 1/2 tablet EVERY OTHER DAY.  30 tablet  11  . losartan (COZAAR) 50 MG tablet Take 1 tablet (50 mg total) by mouth daily.  30 tablet  6  . metFORMIN (GLUCOPHAGE-XR) 500 MG 24 hr tablet Take two tablet by mouth in the morning and two tablets by mouth in the evening       . Multiple Vitamins-Minerals (EYE-VITE EXTRA PLUS LUTEIN PO) Take by mouth as directed.        . Omega-3 Fatty Acids (OMEGA-3 CF) 1000 MG CAPS Take 2 capsules by mouth daily.       . polyethylene glycol powder (MIRALAX) powder Take 17 g by mouth as needed.       . warfarin (COUMADIN) 2.5 MG tablet Take 1 tablet (2.5 mg total) by mouth as directed.  45 tablet  11    Allergies  Allergen Reactions  . Alendronate Sodium  REACTION: Rash  . Atorvastatin     REACTION: Muscle aches  . Codeine     REACTION: N \\T \ V  . Ezetimibe-Simvastatin     REACTION: Muscle aches  . Lovastatin     REACTION: Myalgias  . Penicillins     REACTION: Anaphylaxis  . Rosiglitazone Maleate     REACTION: Edema    Past Medical History  Diagnosis Date  . Atrial fibrillation   . DM2 (diabetes mellitus, type 2)   . HLD (hyperlipidemia)   . HTN (hypertension)   . Hypothyroidism   . Osteoporosis   . Anxiety   . GERD (gastroesophageal reflux disease)     esophageal web  . Ovarian cancer   . Granuloma annulare     Dr. Jarold Motto.     Past Surgical History  Procedure Date  . Femoral hernia repair 1958    R  . Stillbirth 1969  . Laminectomy l4-5 1971  . Total abdominal hysterectomy w/ bilateral salpingoophorectomy 1980    ovarian cancer  . 2nd look laparotomy 1982  . Cholecystectomy 1986  . Kidney stone x2 1991  . Fracture l elbow/wrist 2000  .  Intussception/obstruction 12/98  . Wrist fracture surgery 11/05    R  . Cardiac catheterization 8/05    neg; a fib found   . Cataract od 2002  . Myoview stress 8/05    syncope (-)  . Egd/dilation/colon 1/06  . Dexa 9/04 and 1/02  . Cataract surgery 5/07    R  . Abdominal hysterectomy     No family history on file.  History   Social History  . Marital Status: Married    Spouse Name: N/A    Number of Children: 1  . Years of Education: N/A   Occupational History  . 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 Smoking status: Never Smoker   . Smokeless tobacco: Never Used  . Alcohol Use: No  . Drug Use: No  . Sexually Active: Not on file   Other Topics Concern  . 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    Review of Systems Bowel issues---from constipation to diarrhea Weight is down a few pounds Sleeps okay in general    Objective:   Physical Exam  Constitutional: She appears well-developed and well-nourished. No distress.  Neck: No thyromegaly present.  Cardiovascular: Normal rate, normal heart sounds and intact distal pulses.  Exam reveals no gallop.   No murmur heard.      irregular  Pulmonary/Chest: Effort normal and breath sounds normal. No respiratory distress. She has no wheezes. She has no rales.  Abdominal: Soft. There is no tenderness.  Musculoskeletal:       Thick calves without pitting Varicosities present  Lymphadenopathy:    She has no cervical adenopathy.  Psychiatric: She has a normal mood and affect. Her behavior is normal.          Assessment & Plan:

## 2011-12-19 ENCOUNTER — Ambulatory Visit: Payer: Medicare Other

## 2011-12-21 ENCOUNTER — Ambulatory Visit (INDEPENDENT_AMBULATORY_CARE_PROVIDER_SITE_OTHER): Payer: Medicare Other | Admitting: Internal Medicine

## 2011-12-21 DIAGNOSIS — Z7901 Long term (current) use of anticoagulants: Secondary | ICD-10-CM

## 2011-12-21 DIAGNOSIS — Z5181 Encounter for therapeutic drug level monitoring: Secondary | ICD-10-CM

## 2011-12-21 DIAGNOSIS — I4891 Unspecified atrial fibrillation: Secondary | ICD-10-CM

## 2011-12-21 LAB — POCT INR: INR: 4.3

## 2011-12-21 NOTE — Patient Instructions (Signed)
Hold next 2 doses then take 2.5 mg sat,sun, mon recheck Tues

## 2011-12-26 ENCOUNTER — Ambulatory Visit (INDEPENDENT_AMBULATORY_CARE_PROVIDER_SITE_OTHER): Payer: Medicare Other | Admitting: Internal Medicine

## 2011-12-26 DIAGNOSIS — Z7901 Long term (current) use of anticoagulants: Secondary | ICD-10-CM

## 2011-12-26 DIAGNOSIS — Z5181 Encounter for therapeutic drug level monitoring: Secondary | ICD-10-CM

## 2011-12-26 DIAGNOSIS — I4891 Unspecified atrial fibrillation: Secondary | ICD-10-CM

## 2011-12-26 LAB — POCT INR: INR: 2

## 2011-12-26 NOTE — Patient Instructions (Signed)
2.5 mg daily , check 2 weeks

## 2012-01-09 ENCOUNTER — Ambulatory Visit: Payer: Medicare Other

## 2012-01-15 ENCOUNTER — Ambulatory Visit (INDEPENDENT_AMBULATORY_CARE_PROVIDER_SITE_OTHER): Payer: Medicare Other | Admitting: Internal Medicine

## 2012-01-15 DIAGNOSIS — Z7901 Long term (current) use of anticoagulants: Secondary | ICD-10-CM

## 2012-01-15 DIAGNOSIS — I4891 Unspecified atrial fibrillation: Secondary | ICD-10-CM

## 2012-01-15 DIAGNOSIS — Z5181 Encounter for therapeutic drug level monitoring: Secondary | ICD-10-CM

## 2012-01-15 LAB — POCT INR: INR: 1.5

## 2012-01-15 NOTE — Patient Instructions (Signed)
2.5 mg daily , except 5 mg MON, check 1weeks ( increased  her dose by 2.5 mg weekly)

## 2012-01-23 ENCOUNTER — Ambulatory Visit: Payer: Medicare Other

## 2012-01-24 ENCOUNTER — Ambulatory Visit (INDEPENDENT_AMBULATORY_CARE_PROVIDER_SITE_OTHER): Payer: Medicare Other | Admitting: Internal Medicine

## 2012-01-24 DIAGNOSIS — Z5181 Encounter for therapeutic drug level monitoring: Secondary | ICD-10-CM

## 2012-01-24 DIAGNOSIS — Z7901 Long term (current) use of anticoagulants: Secondary | ICD-10-CM

## 2012-01-24 DIAGNOSIS — I4891 Unspecified atrial fibrillation: Secondary | ICD-10-CM

## 2012-01-24 LAB — POCT INR: INR: 1.5

## 2012-01-24 NOTE — Patient Instructions (Signed)
Resume 2.5 mg daily , except 5 mg Wed, check  2 weeks

## 2012-02-07 ENCOUNTER — Ambulatory Visit (INDEPENDENT_AMBULATORY_CARE_PROVIDER_SITE_OTHER): Payer: Medicare Other | Admitting: Internal Medicine

## 2012-02-07 DIAGNOSIS — Z5181 Encounter for therapeutic drug level monitoring: Secondary | ICD-10-CM

## 2012-02-07 DIAGNOSIS — Z7901 Long term (current) use of anticoagulants: Secondary | ICD-10-CM

## 2012-02-07 DIAGNOSIS — I4891 Unspecified atrial fibrillation: Secondary | ICD-10-CM

## 2012-02-07 LAB — POCT INR: INR: 1.5

## 2012-02-07 NOTE — Patient Instructions (Signed)
2.5 mg daily , except 5 mg Wed,  Increased 2.5 mg weekly check  2 weeks

## 2012-02-08 ENCOUNTER — Telehealth: Payer: Self-pay

## 2012-02-08 NOTE — Telephone Encounter (Signed)
Patient needs a minor dental surgical procedure. Patient would need to come off coumadin 4 days prior. She is scheduled for the surgery on Nov. 5, 2013. Fax [phone]. Please fax a letter stating this is okay.

## 2012-02-12 NOTE — Telephone Encounter (Signed)
Certainly Her choice to stop warfarin, But would probably stay on warfarin unless tooth is being pulled

## 2012-02-13 NOTE — Telephone Encounter (Signed)
lmtcb

## 2012-02-13 NOTE — Telephone Encounter (Signed)
Pt called to let me know pt is having oral surgery where surgeon is going to have to remove a crown and go under a root canal and will have to "lift" a tooth. She says it will be extensive surgery and they will not do this without her holding warfarin. I will let Dr. Mariah Milling know it is going to be more invasive than routine surg. And will fax letter if he approves.

## 2012-02-14 ENCOUNTER — Other Ambulatory Visit: Payer: Self-pay | Admitting: *Deleted

## 2012-02-14 MED ORDER — GLUCOSE BLOOD VI STRP: ORAL_STRIP | Status: DC

## 2012-02-14 NOTE — Telephone Encounter (Signed)
Okay to hold warfarin

## 2012-02-15 NOTE — Telephone Encounter (Signed)
Faxing letter to dentist

## 2012-02-21 ENCOUNTER — Ambulatory Visit (INDEPENDENT_AMBULATORY_CARE_PROVIDER_SITE_OTHER): Payer: Medicare Other

## 2012-02-29 ENCOUNTER — Telehealth: Payer: Self-pay | Admitting: Cardiovascular Disease

## 2012-02-29 NOTE — Telephone Encounter (Signed)
Options, instead of warfarin,  include xarelto 20 mg daily or eliquis 5 mg twice a day No lab work needed

## 2012-02-29 NOTE — Telephone Encounter (Signed)
See below and advise

## 2012-02-29 NOTE — Telephone Encounter (Signed)
Pt calling concerning her coumadin. Pt was off coumadin due to procedure and now that she is ready to restart and wants to know if there is another med she can take instead of coumadin

## 2012-03-01 NOTE — Telephone Encounter (Signed)
Terri,  Please send the protime result to Dr Mariah Milling when she comes in

## 2012-03-01 NOTE — Telephone Encounter (Signed)
Pt informed She would like to switch to Xarelto 20 mg daily since this is a qd dose instead of Eliquis which is a BID dose She thinks the warfarin is making her feel "fuzzy brained" and "staggery", also is associated with general malaise. Says she has always felt this way on warfarin. Has been holding warfarin x 5 days prior to dental work and resumed last night. While holding this med she fels better and had less fatigue and felt thinking was "clearer". Wishes to try Xarelto 20 mg daily Will pick up samples at [facility] Understands she cannot start this med until after holding warfarin and making sure INR<2.0 She verb. Understanding and will make appt with Dr. Karle Starch office for 03/04/12 to have INR checked. She will call us with results and we will instruct her from there. I will also send this info to Dr. Alphonsus Sias to keep him informed.

## 2012-03-05 ENCOUNTER — Telehealth: Payer: Self-pay | Admitting: *Deleted

## 2012-03-05 NOTE — Telephone Encounter (Signed)
Pt called wanting to know is she should come for INR today or wait for her appointment in 11/20. She states she had a dental procedure last week held coumadin x 6 days, and restarted 02/29/12.  She also states that Dr Mariah Milling was interested in putting her on another med in place of the coumadin, but her INR had to be therapeutic before he could do that. I advised pt we could check her INR today, but it would still be low considering she held for almost a week and not enough time has passed since she has been back on coumadin to have it build back up and give an accurate reading of what INR is when taken routinely. Pt chose to wait and keep her appt on 11/20.

## 2012-03-05 NOTE — Telephone Encounter (Signed)
Okay to just recheck it on 11/20 if she has been on a stable dose and restarted at that

## 2012-03-11 ENCOUNTER — Other Ambulatory Visit: Payer: Self-pay | Admitting: *Deleted

## 2012-03-11 ENCOUNTER — Telehealth: Payer: Self-pay

## 2012-03-11 ENCOUNTER — Ambulatory Visit (INDEPENDENT_AMBULATORY_CARE_PROVIDER_SITE_OTHER): Payer: Medicare Other | Admitting: Cardiovascular Disease

## 2012-03-11 DIAGNOSIS — I4891 Unspecified atrial fibrillation: Secondary | ICD-10-CM

## 2012-03-11 MED ORDER — RIVAROXABAN 20 MG PO TABS: 20.0000 mg | ORAL_TABLET | Freq: Every day | ORAL | Status: DC

## 2012-03-11 MED ORDER — WARFARIN SODIUM 2.5 MG PO TABS: 2.5000 mg | ORAL_TABLET | ORAL | Status: DC

## 2012-03-11 NOTE — Progress Notes (Signed)
Pt here to pick up samples of Xarelto Says she called PCP after our discussion 03/01/12 and was told she should wait until tomm to have INR checked since it may be low, since she had been holding this for dental procedure She comes in today to pick up Xarelto samples as discussed 03/01/12 and to ask what INR needs to be to be able to switch to Xarelto from warfarin I explained it should be >/= 2.0. If INR.2.0 she may stop warfarin and switch to Xarelto, per Dr. Mariah Milling Understanding verb She is to go to Dr. Karle Starch office tomm for INR We will get these results and will call her to inform of instructions Understanding verb

## 2012-03-11 NOTE — Telephone Encounter (Signed)
error 

## 2012-03-11 NOTE — Patient Instructions (Addendum)
Your physician wants you to follow-up in: 1 month with Dr. Mariah Milling. You will receive a reminder letter in the mail two months in advance. If you don't receive a letter, please call our office to schedule the follow-up appointment.  Call us should you not be able to tolerate Xarelto/have further questions re: medication

## 2012-03-12 NOTE — Telephone Encounter (Signed)
Pt has questions about her coumadin and when she needs to have it checked

## 2012-03-12 NOTE — Telephone Encounter (Signed)
Pt forgot to ask me about monitoring INR when on Xarelto I explained there is no monitoring needed when on Xarelto She verb understanding She also asks how Dr. Mariah Milling determines 20mg  vs. 15 mg dose I explained this is based on kidney fxn and hers was normal which is why Dr. Mariah Milling chose this dose Understanding verb

## 2012-03-13 ENCOUNTER — Ambulatory Visit (INDEPENDENT_AMBULATORY_CARE_PROVIDER_SITE_OTHER): Payer: Medicare Other | Admitting: Internal Medicine

## 2012-03-13 ENCOUNTER — Telehealth: Payer: Self-pay | Admitting: *Deleted

## 2012-03-13 DIAGNOSIS — I4891 Unspecified atrial fibrillation: Secondary | ICD-10-CM

## 2012-03-13 DIAGNOSIS — Z7901 Long term (current) use of anticoagulants: Secondary | ICD-10-CM

## 2012-03-13 DIAGNOSIS — Z5181 Encounter for therapeutic drug level monitoring: Secondary | ICD-10-CM

## 2012-03-13 NOTE — Patient Instructions (Addendum)
Take 5 mg for the next 3 days then start 2.5 mg daily except 5 mgTues, Thurs, Sat

## 2012-03-13 NOTE — Telephone Encounter (Signed)
Opened in error

## 2012-03-22 ENCOUNTER — Ambulatory Visit: Payer: Medicare Other

## 2012-03-25 ENCOUNTER — Telehealth: Payer: Self-pay

## 2012-03-25 NOTE — Telephone Encounter (Signed)
inr

## 2012-03-26 ENCOUNTER — Ambulatory Visit (INDEPENDENT_AMBULATORY_CARE_PROVIDER_SITE_OTHER): Payer: Medicare Other | Admitting: General Practice

## 2012-03-26 DIAGNOSIS — I4891 Unspecified atrial fibrillation: Secondary | ICD-10-CM

## 2012-03-26 LAB — POCT INR: INR: 2

## 2012-03-29 ENCOUNTER — Telehealth: Payer: Self-pay

## 2012-03-29 NOTE — Telephone Encounter (Signed)
Per Dr. Sharyn Lull office/anticoag note, pt started Xarelto 03/26/12

## 2012-04-11 ENCOUNTER — Ambulatory Visit (INDEPENDENT_AMBULATORY_CARE_PROVIDER_SITE_OTHER): Payer: Medicare Other | Admitting: Cardiovascular Disease

## 2012-04-11 ENCOUNTER — Encounter: Payer: Self-pay | Admitting: Cardiovascular Disease

## 2012-04-11 VITALS — BP 130/82 | HR 89 | Ht 63.0 in | Wt 174.0 lb

## 2012-04-11 DIAGNOSIS — I10 Essential (primary) hypertension: Secondary | ICD-10-CM

## 2012-04-11 DIAGNOSIS — I4891 Unspecified atrial fibrillation: Secondary | ICD-10-CM

## 2012-04-11 DIAGNOSIS — R42 Dizziness and giddiness: Secondary | ICD-10-CM

## 2012-04-11 DIAGNOSIS — E785 Hyperlipidemia, unspecified: Secondary | ICD-10-CM

## 2012-04-11 DIAGNOSIS — E119 Type 2 diabetes mellitus without complications: Secondary | ICD-10-CM

## 2012-04-11 DIAGNOSIS — R269 Unspecified abnormalities of gait and mobility: Secondary | ICD-10-CM

## 2012-04-11 DIAGNOSIS — R2681 Unsteadiness on feet: Secondary | ICD-10-CM

## 2012-04-11 MED ORDER — RIVAROXABAN 20 MG PO TABS: 20.0000 mg | ORAL_TABLET | Freq: Every day | ORAL | Status: DC

## 2012-04-11 NOTE — Assessment & Plan Note (Signed)
We have encouraged continued exercise, careful diet management in an effort to lose weight. 

## 2012-04-11 NOTE — Assessment & Plan Note (Signed)
We have strongly recommended that she start regular exercise program starting initially with a personal trainer. She needs to do this more regularly as symptoms have been getting worse over the past year. No recent falls.

## 2012-04-11 NOTE — Assessment & Plan Note (Signed)
Blood pressure is well controlled on today's visit. No changes made to the medications. 

## 2012-04-11 NOTE — Assessment & Plan Note (Signed)
Rate is reasonably well controlled. She is on anticoagulation.

## 2012-04-11 NOTE — Assessment & Plan Note (Signed)
She's not interested in any cholesterol medication.

## 2012-04-11 NOTE — Progress Notes (Signed)
Patient ID: Jodi Reeves, female    DOB: Jan 14, 1928, 78 y.o.   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 No intervention was needed.   Her biggest complaint today is she has waxing and waning coordination and balance issues, she is getting slower over the past year. No recent falls. She has worked in the past with a Systems analyst but this was expensive. She does have the opportunity to participate in Silver sneakers at a gym and [employer]. She has not been doing so. She has had weight gain over the past several months. She does not want a cholesterol medication. She wonders if her gait instability could be partly from diabetic neuropathy. She is taking Xarelto with no bleeding complications.  She wears compression hose frequently for nonpitting edema, likely venous insufficiency.    She had amiodarone many years ago and she believes that this caused her thyroid problem.   EKG shows atrial fibrillation with rate 84 beats per minute, no significant ST or T wave changes   Outpatient Encounter Prescriptions as of 04/11/2012  Medication Sig Dispense Refill  . Ascorbic Acid (VITAMIN C) 1000 MG tablet Take 1,000 mg by mouth daily.        . BD PEN NEEDLE NANO U/F 32G X 4 MM MISC Inject 1 Syringe as directed as needed.       . bisoprolol-hydrochlorothiazide (ZIAC) 2.5-6.25 MG per tablet Take 1 tablet by mouth daily.  30 tablet  11  . Calcium Carbonate-Vit D-Min (SM CALCIUM/VITAMIN D3 PO) Take 1 tablet by mouth daily.      . Coenzyme Q10 60 MG TABS Take 1 tablet by mouth daily.       . digoxin (LANOXIN) 0.125 MG tablet Take 1 tablet (125 mcg total) by mouth daily.  30 tablet  11  . Folic Acid-Vit B6-Vit B12 (FA-VITAMIN B-6-VITAMIN B-12) 2.2-25-0.5 MG TABS Place under  the tongue daily.        . furosemide (LASIX) 20 MG tablet Take 1 tablet (20 mg total) by mouth daily.  30 tablet  11  . glipiZIDE (GLIPIZIDE XL) 10 MG 24 hr tablet Take 1 tablet by mouth once daily  30 tablet  11  . glucose blood (ONE TOUCH ULTRA TEST) test strip Use to test blood sugar twice daily dx: 250.00  100 each  11  . levothyroxine (SYNTHROID, LEVOTHROID) 88 MCG tablet Take 1 whole tablet by mouth EVERY OTHER DAY and 1/2 tablet EVERY OTHER DAY.  30 tablet  11  . Magnesium 250 MG TABS Take 1 tablet by mouth daily.      . metFORMIN (GLUCOPHAGE-XR) 500 MG 24 hr tablet Take two tablet by mouth in the morning and two tablets by mouth in the evening       . Multiple Vitamins-Minerals (EYE-VITE EXTRA PLUS LUTEIN PO) Take by mouth as directed.        . multivitamin-lutein (OCUVITE-LUTEIN) CAPS Take 1 capsule by mouth daily.      . Omega-3 Fatty Acids (OMEGA-3 CF) 1000 MG CAPS Take 2 capsules by mouth daily.       . polyethylene glycol powder (MIRALAX) powder Take 17 g by mouth as needed.       . Rivaroxaban (XARELTO) 20 MG TABS Take 1 tablet (20 mg total) by mouth daily.  30 tablet  6   Review of Systems  Constitutional: Negative.   HENT: Negative.   Eyes: Negative.   Respiratory: Negative.   Gastrointestinal: Negative.   Musculoskeletal: Positive for myalgias, back pain and gait problem.  Skin: Negative.   Hematological: Negative.   Psychiatric/Behavioral: Negative.   All other systems reviewed and are negative.    BP 130/82  Pulse 89  Ht 5\' 3"  (1.6 m)  Wt 174 lb (78.926 kg)  BMI 30.82 kg/m2  Physical Exam  Nursing note and vitals reviewed. Constitutional: She is oriented to person, place, and time. She appears well-developed and well-nourished.  HENT:  Head: Normocephalic.  Nose: Nose normal.  Mouth/Throat: Oropharynx is clear and moist.  Eyes: Conjunctivae normal are normal. Pupils are equal, round, and reactive to light.  Neck: Normal range of motion. Neck supple. No JVD  present.  Cardiovascular: Normal rate, S1 normal, S2 normal, normal heart sounds and intact distal pulses.  An irregularly irregular rhythm present. Exam reveals no gallop and no friction rub.   No murmur heard.      Nonpitting edema with varicosities noted to below the knee bilaterally.  Pulmonary/Chest: Effort normal and breath sounds normal. No respiratory distress. She has no wheezes. She has no rales. She exhibits no tenderness.  Abdominal: Soft. Bowel sounds are normal. She exhibits no distension. There is no tenderness.  Musculoskeletal: Normal range of motion. She exhibits no edema and no tenderness.  Lymphadenopathy:    She has no cervical adenopathy.  Neurological: She is alert and oriented to person, place, and time. Coordination normal.  Skin: Skin is warm and dry. No rash noted. No erythema.  Psychiatric: She has a normal mood and affect. Her behavior is normal. Judgment and thought content normal.         Assessment and Plan

## 2012-04-11 NOTE — Patient Instructions (Addendum)
You are doing well. No medication changes were made.  Please increase the exercise  Please call us if you have new issues that need to be addressed before your next appt.  Your physician wants you to follow-up in: 6 months.  You will receive a reminder letter in the mail two months in advance. If you don't receive a letter, please call our office to schedule the follow-up appointment.

## 2012-05-06 ENCOUNTER — Telehealth: Payer: Self-pay | Admitting: *Deleted

## 2012-05-06 NOTE — Telephone Encounter (Signed)
Can you help this pt with samples?

## 2012-05-06 NOTE — Telephone Encounter (Signed)
Samples of Xarelto 20 mg are up front for patient to pick up on Wednesday 05/09/11.

## 2012-05-06 NOTE — Telephone Encounter (Signed)
Pt wants to know if she can get more samples of xarelto. Looks like was getting gcoumadin check at [facility] office. Not sure who started her on xarelto.

## 2012-05-20 ENCOUNTER — Ambulatory Visit: Payer: Medicare Other | Admitting: Cardiovascular Disease

## 2012-06-10 ENCOUNTER — Telehealth: Payer: Self-pay | Admitting: *Deleted

## 2012-06-10 NOTE — Telephone Encounter (Signed)
Pt calling for samples of xarelto. Also states that she needs to speak to nurse

## 2012-06-10 NOTE — Telephone Encounter (Signed)
I would be concerned with her coming off anticoagulation given chronic atrial fibrillation and risk of stroke It is certainly her decision Another medication is not cheap. Which she prefer warfarin. Sometimes primary care can check the INR  for minimal cost

## 2012-06-10 NOTE — Telephone Encounter (Signed)
Pt needs samples of Xarelto Will place at [facility]  She also asks if Dr. Mariah Milling is ok with her being on ASA alone and coming off the Xarelto I told her I would ask and let her know what he says Understanding verb

## 2012-06-11 NOTE — Telephone Encounter (Signed)
Pt informed She will stay on Xarelto as prescribed

## 2012-06-18 ENCOUNTER — Telehealth: Payer: Self-pay | Admitting: *Deleted

## 2012-06-18 NOTE — Telephone Encounter (Signed)
Spoke with pharmacist and advised results   

## 2012-06-18 NOTE — Telephone Encounter (Signed)
Received refill fax from pharmacy stating pt's Glipizide XL 10 mg has changed to 5 mg once daily, pt's last A1C was 2012, do you manage her diabetes? Please advise  .left message to have patient return my call.

## 2012-06-18 NOTE — Telephone Encounter (Signed)
No, sees Dr Renae Fickle at [facility] Let the pharmacy know

## 2012-06-20 ENCOUNTER — Other Ambulatory Visit: Payer: Self-pay | Admitting: *Deleted

## 2012-06-20 MED ORDER — DIGOXIN 125 MCG PO TABS: 125.0000 ug | ORAL_TABLET | Freq: Every day | ORAL | Status: DC

## 2012-06-24 LAB — HEMOGLOBIN A1C: Hgb A1c MFr Bld: 7 % — AB (ref 4.0–6.0)

## 2012-07-08 ENCOUNTER — Other Ambulatory Visit: Payer: Self-pay | Admitting: *Deleted

## 2012-07-08 NOTE — Telephone Encounter (Signed)
Pt wanting to know if we have an xarelto samples.

## 2012-07-09 ENCOUNTER — Telehealth: Payer: Self-pay

## 2012-07-09 MED ORDER — RIVAROXABAN 20 MG PO TABS: 20.0000 mg | ORAL_TABLET | Freq: Every day | ORAL | Status: DC

## 2012-07-09 NOTE — Telephone Encounter (Signed)
Pt called to say she is out of xarelto and needs samples and RX sent to pharm This has been taken care of  She then asks what Dr. Mariah Milling would want her to do re:CP She says she has been having intermittent episodes of chest, back, shoulder pain over the last few weeks and is unsure if this is r/t her heart She does not have NTG to take PRN and last cath in 2005 showed non obstructive CAD I advised her to go to ER should symptoms become persistent/it is after hours/we are closed Otherwise she can call our office if in no acute distress and we can advise her I offered to make appt for her but she declines and says she will call us should these symptoms continue

## 2012-09-19 ENCOUNTER — Other Ambulatory Visit: Payer: Self-pay | Admitting: *Deleted

## 2012-09-19 MED ORDER — FUROSEMIDE 20 MG PO TABS: 20.0000 mg | ORAL_TABLET | Freq: Every day | ORAL | Status: DC

## 2012-11-19 ENCOUNTER — Encounter: Payer: Self-pay | Admitting: Neurology

## 2012-11-21 ENCOUNTER — Telehealth: Payer: Self-pay

## 2012-11-21 ENCOUNTER — Other Ambulatory Visit: Payer: Self-pay | Admitting: *Deleted

## 2012-11-21 MED ORDER — BISOPROLOL-HYDROCHLOROTHIAZIDE 2.5-6.25 MG PO TABS: 1.0000 | ORAL_TABLET | Freq: Every day | ORAL | Status: DC

## 2012-11-21 NOTE — Telephone Encounter (Signed)
That is fine If the 20mg  is not effective, I can increase to 40

## 2012-11-21 NOTE — Telephone Encounter (Signed)
Pt did not think she could get furosemide refill at [facility] due to occasionally taking more than one furosemide 20 mg daily due to hot weather causing feet and legs to swell more. Cheryl at [facility] said pt had rx ready for pick up . Pt notified.  pt not having SOB or CP and is wearing compression hose and keeps legs up when sitting.  Pt will cb if condition changes or worsens prior to appt. Pt wanted Dr Alphonsus Sias to be aware of increased swelling at times and will discuss with him at 08/13 appt.

## 2012-11-22 ENCOUNTER — Encounter: Payer: Self-pay | Admitting: Neurology

## 2012-11-26 ENCOUNTER — Telehealth: Payer: Self-pay

## 2012-11-26 NOTE — Telephone Encounter (Signed)
LMOM patient needs to make an appointment. She was to come in June but cancelled appointment. Told patient to call back and discuss to get samples of xarelto.

## 2012-11-26 NOTE — Telephone Encounter (Signed)
Pt would like Xarelto samples. Ok to leave message on machine

## 2012-11-27 NOTE — Telephone Encounter (Signed)
Gave samples of xarelto and patient made a follow up appointment with Dr. Mariah Milling.

## 2012-12-04 ENCOUNTER — Encounter: Payer: Medicare Other | Admitting: Internal Medicine

## 2012-12-12 ENCOUNTER — Encounter: Payer: Self-pay | Admitting: Cardiovascular Disease

## 2012-12-12 ENCOUNTER — Other Ambulatory Visit: Payer: Self-pay | Admitting: *Deleted

## 2012-12-12 ENCOUNTER — Ambulatory Visit (INDEPENDENT_AMBULATORY_CARE_PROVIDER_SITE_OTHER): Payer: Medicare Other | Admitting: Cardiovascular Disease

## 2012-12-12 VITALS — BP 131/79 | HR 68 | Ht 63.0 in | Wt 169.8 lb

## 2012-12-12 DIAGNOSIS — I10 Essential (primary) hypertension: Secondary | ICD-10-CM

## 2012-12-12 DIAGNOSIS — R609 Edema, unspecified: Secondary | ICD-10-CM

## 2012-12-12 DIAGNOSIS — E785 Hyperlipidemia, unspecified: Secondary | ICD-10-CM

## 2012-12-12 DIAGNOSIS — E119 Type 2 diabetes mellitus without complications: Secondary | ICD-10-CM

## 2012-12-12 DIAGNOSIS — I4891 Unspecified atrial fibrillation: Secondary | ICD-10-CM

## 2012-12-12 MED ORDER — DIGOXIN 125 MCG PO TABS: 125.0000 ug | ORAL_TABLET | Freq: Every day | ORAL | Status: DC

## 2012-12-12 NOTE — Telephone Encounter (Signed)
rx sent to pharmacy by e-script  

## 2012-12-12 NOTE — Progress Notes (Signed)
Patient ID: Jodi Reeves, female    DOB: 03/16/1928, 79 y.o.   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 No intervention was needed.   She continues to have coordination and balance issues, she has been working with a physical therapist . No recent falls.  She is tolerating anticoagulation with no problems . She does finding medication expensive . She does belong to [employer] in [place].  In the past, She does not want a cholesterol medication. She is now on insulin.   She wears compression hose frequently for nonpitting edema, likely venous insufficiency.    She had amiodarone many years ago and she believes that this caused her thyroid problem.   EKG shows atrial fibrillation with rate 68 beats per minute, no significant ST or T wave changes   Outpatient Encounter Prescriptions as of 12/12/2012  Medication Sig Dispense Refill  . Ascorbic Acid (VITAMIN C) 1000 MG tablet Take 1,000 mg by mouth daily.        . BD PEN NEEDLE NANO U/F 32G X 4 MM MISC Inject 1 Syringe as directed as needed.       . bisoprolol-hydrochlorothiazide (ZIAC) 2.5-6.25 MG per tablet Take 1 tablet by mouth daily.  30 tablet  11  . Calcium Carbonate-Vit D-Min (SM CALCIUM/VITAMIN D3 PO) Take 1 tablet by mouth daily.      . Cholecalciferol (VITAMIN D3) 2000 UNITS TABS Take by mouth daily.      Marland Kitchen Co-Enzyme Q10 100 MG CAPS Take by mouth daily.      . digoxin (LANOXIN) 0.125 MG tablet Take 1 tablet (125 mcg total) by mouth daily.  30 tablet  11  . Folic Acid-Vit B6-Vit B12 (FA-VITAMIN B-6-VITAMIN B-12) 2.2-25-0.5 MG TABS Place under the tongue daily.        . furosemide (LASIX) 20 MG tablet Take 1 tablet (20 mg total) by mouth daily.  30 tablet  11  . glipiZIDE (GLIPIZIDE XL) 10 MG  24 hr tablet Take 1 tablet by mouth once daily  30 tablet  11  . glucose blood (ONE TOUCH ULTRA TEST) test strip Use to test blood sugar twice daily dx: 250.00  100 each  11  . insulin glargine (LANTUS) 100 UNIT/ML injection Inject 78 Units into the skin at bedtime.      Marland Kitchen levothyroxine (SYNTHROID, LEVOTHROID) 50 MCG tablet Take 50 mcg by mouth daily before breakfast.      . Magnesium 250 MG TABS Take 1 tablet by mouth daily.      . metFORMIN (GLUCOPHAGE-XR) 500 MG 24 hr tablet Take two tablet by mouth in the morning and two tablets by mouth in the evening       . multivitamin-lutein (OCUVITE-LUTEIN) CAPS Take 1 capsule by mouth daily.      . polyethylene glycol powder (MIRALAX) powder Take 17 g by mouth as needed.       . Rivaroxaban (XARELTO) 20 MG TABS Take 1 tablet (20 mg total) by mouth daily.  30 tablet  6   Review of Systems  Constitutional: Negative.   HENT: Negative.   Eyes: Negative.   Respiratory: Negative.   Cardiovascular: Negative.   Gastrointestinal: Negative.   Musculoskeletal: Positive for myalgias, back pain and gait problem.  Skin: Negative.   Neurological: Negative.   Psychiatric/Behavioral: Negative.  All other systems reviewed and are negative.    BP 131/79  Pulse 68  Ht 5\' 3"  (1.6 m)  Wt 169 lb 12 oz (76.998 kg)  BMI 30.08 kg/m2  Physical Exam  Nursing note and vitals reviewed. Constitutional: She is oriented to person, place, and time. She appears well-developed and well-nourished.  HENT:  Head: Normocephalic.  Nose: Nose normal.  Mouth/Throat: Oropharynx is clear and moist.  Eyes: Conjunctivae are normal. Pupils are equal, round, and reactive to light.  Neck: Normal range of motion. Neck supple. No JVD present.  Cardiovascular: Normal rate, S1 normal, S2 normal, normal heart sounds and intact distal pulses.  An irregularly irregular rhythm present. Exam reveals no gallop and no friction rub.   No murmur heard. Nonpitting edema with varicosities  noted to below the knee bilaterally.  Pulmonary/Chest: Effort normal and breath sounds normal. No respiratory distress. She has no wheezes. She has no rales. She exhibits no tenderness.  Abdominal: Soft. Bowel sounds are normal. She exhibits no distension. There is no tenderness.  Musculoskeletal: Normal range of motion. She exhibits no edema and no tenderness.  Lymphadenopathy:    She has no cervical adenopathy.  Neurological: She is alert and oriented to person, place, and time. Coordination normal.  Skin: Skin is warm and dry. No rash noted. No erythema.  Psychiatric: She has a normal mood and affect. Her behavior is normal. Judgment and thought content normal.    Assessment and Plan

## 2012-12-12 NOTE — Assessment & Plan Note (Signed)
Heart rate is well controlled. Tolerating anticoagulation. Samples provided

## 2012-12-12 NOTE — Telephone Encounter (Signed)
Last filled 11/20/12 

## 2012-12-12 NOTE — Telephone Encounter (Signed)
Okay to refill for a year 

## 2012-12-12 NOTE — Assessment & Plan Note (Signed)
Blood pressure is well controlled on today's visit. No changes made to the medications. 

## 2012-12-12 NOTE — Assessment & Plan Note (Signed)
Minimal edema from venous insufficiency. Continue compression hose

## 2012-12-12 NOTE — Patient Instructions (Addendum)
You are doing well. No medication changes were made.  Check the price  Xarelto, eliquis and pradaxa   Please call us if you have new issues that need to be addressed before your next appt.  Your physician wants you to follow-up in: 12 months.  You will receive a reminder letter in the mail two months in advance. If you don't receive a letter, please call our office to schedule the follow-up appointment.

## 2012-12-12 NOTE — Assessment & Plan Note (Signed)
She did not want a cholesterol medication in the past. She would like to try fish oil

## 2012-12-12 NOTE — Assessment & Plan Note (Signed)
We have encouraged continued exercise, careful diet management in an effort to lose weight. 

## 2012-12-23 ENCOUNTER — Encounter: Payer: Self-pay | Admitting: Neurology

## 2013-01-13 ENCOUNTER — Telehealth: Payer: Self-pay

## 2013-01-13 NOTE — Telephone Encounter (Signed)
Pt aware that samples are at front desk for pick up.

## 2013-01-13 NOTE — Telephone Encounter (Signed)
Pt needs Xarelto samples. Please call

## 2013-01-17 ENCOUNTER — Encounter: Payer: Medicare Other | Admitting: Internal Medicine

## 2013-02-03 LAB — HM DIABETES EYE EXAM

## 2013-02-04 ENCOUNTER — Telehealth: Payer: Self-pay

## 2013-02-04 NOTE — Telephone Encounter (Signed)
Pt would like Xarelto samples.  

## 2013-02-04 NOTE — Telephone Encounter (Signed)
Pt is aware that Xarelto samples have been placed up front for pick up.

## 2013-02-07 ENCOUNTER — Encounter: Payer: Self-pay | Admitting: Cardiovascular Disease

## 2013-02-07 ENCOUNTER — Ambulatory Visit (INDEPENDENT_AMBULATORY_CARE_PROVIDER_SITE_OTHER): Payer: Medicare Other | Admitting: Cardiovascular Disease

## 2013-02-07 VITALS — BP 142/84 | HR 84 | Ht 63.0 in | Wt 174.2 lb

## 2013-02-07 DIAGNOSIS — I10 Essential (primary) hypertension: Secondary | ICD-10-CM

## 2013-02-07 DIAGNOSIS — E119 Type 2 diabetes mellitus without complications: Secondary | ICD-10-CM

## 2013-02-07 DIAGNOSIS — E785 Hyperlipidemia, unspecified: Secondary | ICD-10-CM

## 2013-02-07 DIAGNOSIS — I4891 Unspecified atrial fibrillation: Secondary | ICD-10-CM

## 2013-02-07 NOTE — Progress Notes (Signed)
Patient ID: Jodi Reeves, female    DOB: 03/25/1928, 79 y.o.   MRN: [id number]  HPI Comments: 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 No intervention was needed.   She continues to have coordination and balance issues, she has completed working with a physical therapist . No recent falls.   She is tolerating anticoagulation with no problems . She has been on Xarelto but finds this too expensive.  she is thinking of switching back to warfarin .  In the past, She did not want a cholesterol medication. she takes fish oils or Omega oil for her joints. She is now on insulin.   She wears compression hose frequently for nonpitting edema, likely venous insufficiency.    She had amiodarone many years ago and she believes that this caused her thyroid problem.   EKG shows atrial fibrillation with rate 84 beats per minute, no significant ST or T wave changes   Outpatient Encounter Prescriptions as of 02/07/2013  Medication Sig Dispense Refill  . Ascorbic Acid (VITAMIN C) 1000 MG tablet Take 1,000 mg by mouth daily.        . BD PEN NEEDLE NANO U/F 32G X 4 MM MISC Inject 1 Syringe as directed as needed.       . bisoprolol-hydrochlorothiazide (ZIAC) 2.5-6.25 MG per tablet Take 1 tablet by mouth daily.  30 tablet  11  . Calcium Carbonate-Vit D-Min (SM CALCIUM/VITAMIN D3 PO) Take 1 tablet by mouth daily.      . Cholecalciferol (VITAMIN D3) 2000 UNITS TABS Take by mouth daily.      Marland Kitchen Co-Enzyme Q10 100 MG CAPS Take by mouth daily.      . digoxin (LANOXIN) 0.125 MG tablet Take 1 tablet (125 mcg total) by mouth daily.  30 tablet  11  . fluorometholone (FML) 0.1 % ophthalmic suspension Place 1 drop into both eyes as needed.       . Folic Acid-Vit B6-Vit B12 (FA-VITAMIN B-6-VITAMIN B-12) 2.2-25-0.5 MG  TABS Place under the tongue daily.        . furosemide (LASIX) 20 MG tablet Take 1 tablet (20 mg total) by mouth daily.  30 tablet  11  . glipiZIDE (GLIPIZIDE XL) 10 MG 24 hr tablet Take 1 tablet by mouth once daily  30 tablet  11  . glucose blood (ONE TOUCH ULTRA TEST) test strip Use to test blood sugar twice daily dx: 250.00  100 each  11  . insulin glargine (LANTUS) 100 UNIT/ML injection Inject 78 Units into the skin at bedtime.      Marland Kitchen levothyroxine (SYNTHROID, LEVOTHROID) 50 MCG tablet Take 50 mcg by mouth daily before breakfast.      . Magnesium 250 MG TABS Take 1 tablet by mouth daily.      . metFORMIN (GLUCOPHAGE-XR) 500 MG 24 hr tablet Take two tablet by mouth in the morning and two tablets by mouth in the evening       . multivitamin-lutein (OCUVITE-LUTEIN) CAPS Take 1 capsule by mouth daily.      . polyethylene glycol powder (MIRALAX) powder Take 17 g by mouth as needed.       . Rivaroxaban (XARELTO) 20 MG TABS Take 1 tablet (20 mg total) by mouth daily.  30 tablet  6   No facility-administered encounter medications on file as of 02/07/2013.  Review of Systems  Constitutional: Negative.   HENT: Negative.   Eyes: Negative.   Respiratory: Negative.   Cardiovascular: Negative.   Gastrointestinal: Negative.   Endocrine: Negative.   Musculoskeletal: Positive for back pain, gait problem and myalgias.  Skin: Negative.   Allergic/Immunologic: Negative.   Neurological: Negative.   Hematological: Negative.   Psychiatric/Behavioral: Negative.   All other systems reviewed and are negative.    BP 142/84  Pulse 84  Ht 5\' 3"  (1.6 m)  Wt 174 lb 4 oz (79.039 kg)  BMI 30.87 kg/m2  Physical Exam  Nursing note and vitals reviewed. Constitutional: She is oriented to person, place, and time. She appears well-developed and well-nourished.  HENT:  Head: Normocephalic.  Nose: Nose normal.  Mouth/Throat: Oropharynx is clear and moist.  Eyes: Conjunctivae are normal. Pupils are equal,  round, and reactive to light.  Neck: Normal range of motion. Neck supple. No JVD present.  Cardiovascular: Normal rate, S1 normal, S2 normal, normal heart sounds and intact distal pulses.  An irregularly irregular rhythm present. Exam reveals no gallop and no friction rub.   No murmur heard. Nonpitting edema with varicosities noted to below the knee bilaterally.  Pulmonary/Chest: Effort normal and breath sounds normal. No respiratory distress. She has no wheezes. She has no rales. She exhibits no tenderness.  Abdominal: Soft. Bowel sounds are normal. She exhibits no distension. There is no tenderness.  Musculoskeletal: Normal range of motion. She exhibits no edema and no tenderness.  Lymphadenopathy:    She has no cervical adenopathy.  Neurological: She is alert and oriented to person, place, and time. Coordination normal.  Skin: Skin is warm and dry. No rash noted. No erythema.  Psychiatric: She has a normal mood and affect. Her behavior is normal. Judgment and thought content normal.    Assessment and Plan

## 2013-02-07 NOTE — Assessment & Plan Note (Signed)
We have encouraged continued exercise, careful diet management in an effort to lose weight. 

## 2013-02-07 NOTE — Assessment & Plan Note (Signed)
Blood pressure is well controlled on today's visit. No changes made to the medications. 

## 2013-02-07 NOTE — Assessment & Plan Note (Signed)
She would like to retry warfarin. She will discuss this with Dr. Alphonsus Sias in 2 weeks' time. She would like to have INRs checked through his office. She is unable to afford xarelto and many of the other new agents.

## 2013-02-07 NOTE — Patient Instructions (Signed)
You are doing well. No medication changes were made.  Please call us if you have new issues that need to be addressed before your next appt.  Your physician wants you to follow-up in: 6 months.  You will receive a reminder letter in the mail two months in advance. If you don't receive a letter, please call our office to schedule the follow-up appointment.   

## 2013-02-07 NOTE — Assessment & Plan Note (Signed)
Encouraged diet and exercise.  

## 2013-02-17 ENCOUNTER — Ambulatory Visit (INDEPENDENT_AMBULATORY_CARE_PROVIDER_SITE_OTHER): Payer: Medicare Other | Admitting: Internal Medicine

## 2013-02-17 ENCOUNTER — Encounter: Payer: Self-pay | Admitting: Internal Medicine

## 2013-02-17 VITALS — BP 140/80 | HR 77 | Temp 98.2°F | Ht 63.0 in | Wt 174.0 lb

## 2013-02-17 DIAGNOSIS — E1142 Type 2 diabetes mellitus with diabetic polyneuropathy: Secondary | ICD-10-CM | POA: Insufficient documentation

## 2013-02-17 DIAGNOSIS — Z0000 Encounter for general adult medical examination without abnormal findings: Secondary | ICD-10-CM | POA: Insufficient documentation

## 2013-02-17 DIAGNOSIS — I4891 Unspecified atrial fibrillation: Secondary | ICD-10-CM

## 2013-02-17 DIAGNOSIS — E1149 Type 2 diabetes mellitus with other diabetic neurological complication: Secondary | ICD-10-CM

## 2013-02-17 LAB — LIPID PANEL
HDL: 47.8 mg/dL (ref >39.00)
Total CHOL/HDL Ratio: 4
VLDL: 30.4 mg/dL (ref 0.0–40.0)

## 2013-02-17 NOTE — Assessment & Plan Note (Signed)
Still independent with all instrumental ADLs and drives No sig changes in past year Discussed balance and strengthening

## 2013-02-17 NOTE — Progress Notes (Signed)
Subjective:    Patient ID: Jodi Reeves, female    DOB: Aug 22, 1927, 79 y.o.   MRN: [id number]  HPI Here for physical Just fell 2 days ago---[place] [employer] Had eval at [facility]--okay Had just finished 10 weeks of PT and felt more stable Reviewed advanced directives Discussed her krill oil---discussed the lack of clear cut evidence for efficacy for heart issues or cholesterol Still on xarelto--- will hold off on changing to coumadin for now. But may reconsider this.  Sees Dr Renae Fickle for her DM still Control has been good Has kept up with eye doctors Only 1 mild hypoglycemic reaction  No chest pain No SOB No palpitations--but notices it if she walks quickly  Current Outpatient Prescriptions on File Prior to Visit  Medication Sig Dispense Refill  . Ascorbic Acid (VITAMIN C) 1000 MG tablet Take 1,000 mg by mouth daily.        . BD PEN NEEDLE NANO U/F 32G X 4 MM MISC Inject 1 Syringe as directed as needed.       . bisoprolol-hydrochlorothiazide (ZIAC) 2.5-6.25 MG per tablet Take 1 tablet by mouth daily.  30 tablet  11  . Calcium Carbonate-Vit D-Min (SM CALCIUM/VITAMIN D3 PO) Take 1 tablet by mouth daily.      . Cholecalciferol (VITAMIN D3) 2000 UNITS TABS Take by mouth daily.      Marland Kitchen Co-Enzyme Q10 100 MG CAPS Take by mouth daily.      . digoxin (LANOXIN) 0.125 MG tablet Take 1 tablet (125 mcg total) by mouth daily.  30 tablet  11  . fluorometholone (FML) 0.1 % ophthalmic suspension Place 1 drop into both eyes as needed.       . Folic Acid-Vit B6-Vit B12 (FA-VITAMIN B-6-VITAMIN B-12) 2.2-25-0.5 MG TABS Place under the tongue daily.        . furosemide (LASIX) 20 MG tablet Take 1 tablet (20 mg total) by mouth daily.  30 tablet  11  . glipiZIDE (GLIPIZIDE XL) 10 MG 24 hr tablet Take 1 tablet by mouth once daily  30 tablet  11  . glucose blood (ONE TOUCH ULTRA TEST) test strip Use to test blood sugar twice daily dx: 250.00  100 each  11  . insulin glargine (LANTUS) 100  UNIT/ML injection Inject 18 Units into the skin at bedtime.       Marland Kitchen levothyroxine (SYNTHROID, LEVOTHROID) 50 MCG tablet Take 50 mcg by mouth daily before breakfast.      . metFORMIN (GLUCOPHAGE-XR) 500 MG 24 hr tablet Take two tablet by mouth in the morning and two tablets by mouth in the evening       . polyethylene glycol powder (MIRALAX) powder Take 17 g by mouth as needed.       . Rivaroxaban (XARELTO) 20 MG TABS Take 1 tablet (20 mg total) by mouth daily.  30 tablet  6   No current facility-administered medications on file prior to visit.    Allergies  Allergen Reactions  . Alendronate Sodium     REACTION: Rash  . Atorvastatin     REACTION: Muscle aches  . Codeine     REACTION: N \\T \ V  . Ezetimibe-Simvastatin     REACTION: Muscle aches  . Lovastatin     REACTION: Myalgias  . Penicillins     REACTION: Anaphylaxis  . Rosiglitazone Maleate     REACTION: Edema    Past Medical History  Diagnosis Date  . Atrial fibrillation   . DM2 (  diabetes mellitus, type 2)   . HLD (hyperlipidemia)   . HTN (hypertension)   . Hypothyroidism   . Osteoporosis   . Anxiety   . GERD (gastroesophageal reflux disease)     esophageal web  . Ovarian cancer   . Granuloma annulare     Dr. Jarold Motto.     Past Surgical History  Procedure Laterality Date  . Femoral hernia repair  1958    R  . Stillbirth  1969  . Laminectomy l4-5  1971  . Total abdominal hysterectomy w/ bilateral salpingoophorectomy  1980    ovarian cancer  . 2nd look laparotomy  1982  . Cholecystectomy  1986  . Kidney stone x2  1991  . Fracture l elbow/wrist  2000  . Intussception/obstruction  12/98  . Wrist fracture surgery  11/05    R  . Cardiac catheterization  8/05    neg; a fib found   . Cataract od  2002  . Myoview stress  8/05    syncope (-)  . Egd/dilation/colon  1/06  . Dexa  9/04 and 1/02  . Cataract surgery  5/07    R  . Abdominal hysterectomy      No family history on file.  History   Social  History  . Marital Status: Married    Spouse Name: N/A    Number of Children: 1  . Years of Education: N/A   Occupational History  . 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 Smoking status: Never Smoker   . Smokeless tobacco: Never Used  . Alcohol Use: No  . Drug Use: No  . Sexual Activity: Not on file   Other Topics Concern  . Not on file   Social History Narrative   Has living will   Son is healthcare POA   Would accept CPR but not prolonged artificial ventilation    No feeding tube if cognitively unaware   Review of Systems  Constitutional: Negative for fatigue and unexpected weight change.       Weight down 5# Wears seat belt  HENT: Positive for congestion and rhinorrhea. Negative for dental problem, hearing loss and tinnitus.        Regular with the dentist  Eyes: Negative for visual disturbance.       UTD with eye exam  Respiratory: Negative for cough, chest tightness and shortness of breath.   Cardiovascular: Positive for leg swelling. Negative for chest pain and palpitations.       Chronic edema--some improvement recently  Gastrointestinal: Negative for nausea, vomiting, abdominal pain and blood in stool.       Gets rectal urgency--but no incontinence  Endocrine: Negative for cold intolerance and heat intolerance.  Genitourinary: Negative for dysuria, hematuria and difficulty urinating.  Musculoskeletal: Positive for arthralgias and back pain. Negative for joint swelling.       Mild joint issues  Skin: Negative for rash.       Sees Dr Roseanne Kaufman  Allergic/Immunologic: Positive for environmental allergies. Negative for immunocompromised state.       Uses OTC med or nasal spray prn   Neurological: Positive for numbness and headaches. Negative for dizziness, syncope, weakness and light-headedness.       Chronic neuropathy--still takes vit B12  Hematological: Negative for adenopathy. Does not bruise/bleed easily.  Psychiatric/Behavioral: Negative for  sleep disturbance and dysphoric mood. The patient is not nervous/anxious.        Objective:   Physical Exam  Constitutional: She  appears well-developed and well-nourished. No distress.  HENT:  Head: Normocephalic.  Right Ear: External ear normal.  Mouth/Throat: Oropharynx is clear and moist.  Eyes: Conjunctivae and EOM are normal. Pupils are equal, round, and reactive to light.  Neck: Normal range of motion. Neck supple. No thyromegaly present.  Cardiovascular: Normal rate, normal heart sounds and intact distal pulses.  Exam reveals no gallop.   No murmur heard. irregular  Pulmonary/Chest: Effort normal and breath sounds normal. No respiratory distress. She has no wheezes. She has no rales.  Abdominal: Soft. There is no tenderness.  Musculoskeletal: She exhibits no edema and no tenderness.  Lymphadenopathy:    She has no cervical adenopathy.  Neurological:  Decreased sensation in feet  Skin: No rash noted.  No foot lesions  Psychiatric: She has a normal mood and affect. Her behavior is normal.          Assessment & Plan:

## 2013-02-17 NOTE — Assessment & Plan Note (Signed)
No major pain issues Uses walking stick for balance

## 2013-02-17 NOTE — Assessment & Plan Note (Signed)
Good rate control Will keep on the xarelto for now Will check dig level and lipid

## 2013-02-17 NOTE — Assessment & Plan Note (Signed)
Dr Renae Fickle follows Controlled UTD with eye exam also

## 2013-02-19 ENCOUNTER — Encounter: Payer: Self-pay | Admitting: *Deleted

## 2013-03-10 ENCOUNTER — Telehealth: Payer: Self-pay

## 2013-03-10 NOTE — Telephone Encounter (Signed)
Pt would like Xarelto samples. Please call.  

## 2013-03-10 NOTE — Telephone Encounter (Signed)
Placed xarelto 20 mg samples at front desk for pick up. 

## 2013-03-17 ENCOUNTER — Ambulatory Visit: Payer: Self-pay | Admitting: Obstetrics & Gynecology

## 2013-03-25 ENCOUNTER — Other Ambulatory Visit: Payer: Self-pay | Admitting: *Deleted

## 2013-03-25 MED ORDER — GLUCOSE BLOOD VI STRP: ORAL_STRIP | Status: DC

## 2013-04-23 ENCOUNTER — Telehealth: Payer: Self-pay

## 2013-04-23 NOTE — Telephone Encounter (Signed)
Pt would like Xarelto samples.  

## 2013-04-23 NOTE — Telephone Encounter (Signed)
Placed samples of Xarelto 20 mg for pick up. 

## 2013-04-30 ENCOUNTER — Ambulatory Visit (INDEPENDENT_AMBULATORY_CARE_PROVIDER_SITE_OTHER): Payer: Medicare Other | Admitting: Podiatry

## 2013-04-30 ENCOUNTER — Encounter: Payer: Self-pay | Admitting: Podiatry

## 2013-04-30 VITALS — BP 124/70 | HR 71 | Resp 16 | Ht 63.0 in | Wt 168.0 lb

## 2013-04-30 DIAGNOSIS — B351 Tinea unguium: Secondary | ICD-10-CM

## 2013-04-30 DIAGNOSIS — M79609 Pain in unspecified limb: Secondary | ICD-10-CM

## 2013-04-30 NOTE — Progress Notes (Signed)
Lourdez presents today with a chief complaint of painful toenails bilateral.  Objective: Vital signs are stable she is alert and oriented x3. Pulses are strongly palpable bilateral. Nails are thick yellow dystrophic clinically mycotic and dystrophic with sharp incurvated nail margins hallux bilateral.  Assessment: Pain in limb secondary to onychomycosis and ingrown nails.  Plan: Debridement of nails 1 through 5 bilateral is cover service secondary to pain.

## 2013-06-02 ENCOUNTER — Telehealth: Payer: Self-pay

## 2013-06-02 NOTE — Telephone Encounter (Signed)
Spoke w/ pt. Informed her that I am leaving samples of xarelto 20mg  at the front desk for her to pick up. She is very Patent attorney.

## 2013-06-02 NOTE — Telephone Encounter (Signed)
Pt would like Xarelto samples. Pt gives okay to leave msg on answering machine.

## 2013-06-03 ENCOUNTER — Telehealth: Payer: Self-pay

## 2013-06-03 NOTE — Telephone Encounter (Signed)
Lattie Haw called and needs cardiac clearance for pt having office procedure (lipoma on forehead), pt is on Xarelto. States she faxed over form 05/28/2013. Please call.

## 2013-06-03 NOTE — Telephone Encounter (Signed)
There is certainly a risk coming off xarelto as she was in atrial fibrillation on her last clinic visit Certainly if she would like to have the procedure done would stop the anticoagulation at least 2 days before the procedure. Would probably wait 24 hours after the procedure before restarting xarelto

## 2013-06-04 NOTE — Telephone Encounter (Signed)
Left detailed message for Lattie Haw w/ Dr. Donivan Scull recommendation and that I will fax clearance paperwork to their office at [phone].

## 2013-06-26 ENCOUNTER — Ambulatory Visit: Payer: Self-pay | Admitting: Otolaryngology

## 2013-06-30 ENCOUNTER — Telehealth: Payer: Self-pay | Admitting: Internal Medicine

## 2013-06-30 NOTE — Telephone Encounter (Signed)
Pt dropped of handicap form for Dr. Silvio Pate to complete.

## 2013-06-30 NOTE — Telephone Encounter (Signed)
Form done 

## 2013-07-01 NOTE — Telephone Encounter (Signed)
I notified patient form is ready and mailed the form to her.

## 2013-07-03 ENCOUNTER — Telehealth: Payer: Self-pay

## 2013-07-03 NOTE — Telephone Encounter (Signed)
Pt would like Xarelto samples.  

## 2013-07-03 NOTE — Telephone Encounter (Signed)
Samples of Xarelto 20 mg placed at front desk for pick up.

## 2013-07-09 ENCOUNTER — Other Ambulatory Visit: Payer: Self-pay | Admitting: *Deleted

## 2013-07-09 MED ORDER — RIVAROXABAN 20 MG PO TABS: 20.0000 mg | ORAL_TABLET | Freq: Every day | ORAL | Status: DC

## 2013-07-09 NOTE — Telephone Encounter (Signed)
Requested Prescriptions   Signed Prescriptions Disp Refills  . Rivaroxaban (XARELTO) 20 MG TABS tablet 30 tablet 3    Sig: Take 1 tablet (20 mg total) by mouth daily.    Authorizing Provider: Minna Merritts    Ordering User: Britt Bottom

## 2013-07-15 ENCOUNTER — Other Ambulatory Visit: Payer: Self-pay | Admitting: *Deleted

## 2013-07-15 MED ORDER — BISOPROLOL-HYDROCHLOROTHIAZIDE 2.5-6.25 MG PO TABS: 1.0000 | ORAL_TABLET | Freq: Every day | ORAL | Status: DC

## 2013-07-15 MED ORDER — DIGOXIN 125 MCG PO TABS: 125.0000 ug | ORAL_TABLET | Freq: Every day | ORAL | Status: DC

## 2013-07-15 MED ORDER — FUROSEMIDE 20 MG PO TABS: 20.0000 mg | ORAL_TABLET | Freq: Every day | ORAL | Status: DC

## 2013-07-15 NOTE — Telephone Encounter (Signed)
Pt request 90 day supply of med.

## 2013-07-15 NOTE — Telephone Encounter (Signed)
rx sent to pharmacy by e-script  

## 2013-07-15 NOTE — Telephone Encounter (Signed)
Okay #90 x 3

## 2013-07-17 LAB — HM DIABETES EYE EXAM

## 2013-07-30 ENCOUNTER — Ambulatory Visit: Payer: Medicare Other | Admitting: Podiatry

## 2013-08-06 ENCOUNTER — Encounter: Payer: Self-pay | Admitting: Internal Medicine

## 2013-08-06 ENCOUNTER — Encounter: Payer: Self-pay | Admitting: Cardiovascular Disease

## 2013-08-06 ENCOUNTER — Encounter (INDEPENDENT_AMBULATORY_CARE_PROVIDER_SITE_OTHER): Payer: Self-pay

## 2013-08-06 ENCOUNTER — Ambulatory Visit (INDEPENDENT_AMBULATORY_CARE_PROVIDER_SITE_OTHER): Payer: Medicare Other | Admitting: Cardiovascular Disease

## 2013-08-06 VITALS — BP 122/80 | HR 80 | Ht 63.5 in | Wt 175.2 lb

## 2013-08-06 DIAGNOSIS — I4891 Unspecified atrial fibrillation: Secondary | ICD-10-CM

## 2013-08-06 DIAGNOSIS — R609 Edema, unspecified: Secondary | ICD-10-CM

## 2013-08-06 DIAGNOSIS — R269 Unspecified abnormalities of gait and mobility: Secondary | ICD-10-CM

## 2013-08-06 DIAGNOSIS — I10 Essential (primary) hypertension: Secondary | ICD-10-CM

## 2013-08-06 DIAGNOSIS — E1149 Type 2 diabetes mellitus with other diabetic neurological complication: Secondary | ICD-10-CM

## 2013-08-06 DIAGNOSIS — R2681 Unsteadiness on feet: Secondary | ICD-10-CM

## 2013-08-06 DIAGNOSIS — K439 Ventral hernia without obstruction or gangrene: Secondary | ICD-10-CM

## 2013-08-06 NOTE — Progress Notes (Signed)
Patient ID: Jodi Reeves, female    DOB: Apr 10, 1928, 80 y.o.   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 No intervention was needed.   She continues to have coordination and balance issues, she has completed working with a physical therapist . No recent falls.  She did this balance course for quite some time but has not had any exercise since then. Currently very sedentary She does not have the motivation to go exercise despite having access  She is tolerating anticoagulation with no problems . She has been on Xarelto  In the past, She did not want a cholesterol medication. She has stopped taking fish oil   She wears compression hose frequently for nonpitting edema, likely venous insufficiency.    She had amiodarone many years ago and she believes that this caused her thyroid problem.   EKG shows atrial fibrillation with rate 80 beats per minute, no significant ST or T wave changes   Outpatient Encounter Prescriptions as of 08/06/2013  Medication Sig  . Ascorbic Acid (VITAMIN C) 1000 MG tablet Take 1,000 mg by mouth daily.    . BD PEN NEEDLE NANO U/F 32G X 4 MM MISC Inject 1 Syringe as directed as needed.   . bisoprolol-hydrochlorothiazide (ZIAC) 2.5-6.25 MG per tablet Take 1 tablet by mouth daily.  . Calcium Carbonate-Vit D-Min (SM CALCIUM/VITAMIN D3 PO) Take 1 tablet by mouth daily.  . Cholecalciferol (VITAMIN D3) 2000 UNITS TABS Take by mouth daily.  . digoxin (LANOXIN) 0.125 MG tablet Take 1 tablet (125 mcg total) by mouth daily.  . fluorometholone (FML) 0.1 % ophthalmic suspension Place 1 drop into both eyes as needed.   . Folic Acid-Vit J8-SNK N39 (FA-VITAMIN B-6-VITAMIN B-12) 2.2-25-0.5 MG TABS Place under the tongue daily.    . furosemide  (LASIX) 20 MG tablet Take 1 tablet (20 mg total) by mouth daily.  Marland Kitchen glipiZIDE (GLUCOTROL) 5 MG tablet Take 5 mg by mouth daily before breakfast.  . glucose blood (ONE TOUCH ULTRA TEST) test strip Use to test blood sugar twice daily dx: 250.00  . insulin detemir (LEVEMIR) 100 UNIT/ML injection Inject 18 Units into the skin at bedtime.  Marland Kitchen levothyroxine (SYNTHROID, LEVOTHROID) 50 MCG tablet Take 50 mcg by mouth daily before breakfast.  . metFORMIN (GLUCOPHAGE-XR) 500 MG 24 hr tablet Take two tablet by mouth in the morning and two tablets by mouth in the evening   . polyethylene glycol powder (MIRALAX) powder Take 17 g by mouth as needed.   . Rivaroxaban (XARELTO) 20 MG TABS tablet Take 1 tablet (20 mg total) by mouth daily.   Review of Systems  Constitutional: Negative.   HENT: Negative.   Eyes: Negative.   Respiratory: Negative.   Cardiovascular: Positive for leg swelling.  Gastrointestinal: Negative.   Endocrine: Negative.   Musculoskeletal: Positive for back pain, gait problem and myalgias.  Skin: Negative.   Allergic/Immunologic: Negative.   Neurological: Negative.   Hematological: Negative.   Psychiatric/Behavioral: Negative.   All other systems reviewed and are negative.   BP 122/80  Pulse 80  Ht 5' 3.5" (1.613 m)  Wt 175 lb 4 oz (79.493 kg)  BMI 30.55 kg/m2  Physical Exam  Nursing note and vitals reviewed. Constitutional: She is oriented to person, place, and time. She appears well-developed and well-nourished.  HENT:  Head:  Normocephalic.  Nose: Nose normal.  Mouth/Throat: Oropharynx is clear and moist.  Eyes: Conjunctivae are normal. Pupils are equal, round, and reactive to light.  Neck: Normal range of motion. Neck supple. No JVD present.  Cardiovascular: Normal rate, S1 normal, S2 normal, normal heart sounds and intact distal pulses.  An irregularly irregular rhythm present. Exam reveals no gallop and no friction rub.   No murmur heard. Nonpitting edema with  varicosities noted to below the knee bilaterally.  Pulmonary/Chest: Effort normal and breath sounds normal. No respiratory distress. She has no wheezes. She has no rales. She exhibits no tenderness.  Abdominal: Soft. Bowel sounds are normal. She exhibits no distension. There is no tenderness.  Musculoskeletal: Normal range of motion. She exhibits no edema and no tenderness.  Lymphadenopathy:    She has no cervical adenopathy.  Neurological: She is alert and oriented to person, place, and time. Coordination normal.  Skin: Skin is warm and dry. No rash noted. No erythema.  Psychiatric: She has a normal mood and affect. Her behavior is normal. Judgment and thought content normal.    Assessment and Plan

## 2013-08-06 NOTE — Assessment & Plan Note (Signed)
Leg edema likely from venous insufficiency. Better with compression hose and leg elevation

## 2013-08-06 NOTE — Assessment & Plan Note (Signed)
We have encouraged continued exercise, careful diet management in an effort to lose weight. 

## 2013-08-06 NOTE — Assessment & Plan Note (Signed)
Chronic atrial fibrillation with good heart rate control. No medication changes made. She will stay on her anticoagulation

## 2013-08-06 NOTE — Assessment & Plan Note (Signed)
Most of her visit today was spent discussing her need to exercise on a regular basis. She does not have much motivation. She will try harder

## 2013-08-06 NOTE — Assessment & Plan Note (Signed)
She did mention having some discomfort around the umbilicus hernia. This is rare, sharp, very brief period. Suggested she let her primary care physician know if symptoms become more frequent, more severe

## 2013-08-06 NOTE — Patient Instructions (Addendum)
You are doing well. No medication changes were made.  Please call us if you have new issues that need to be addressed before your next appt.  Your physician wants you to follow-up in: 12 months.  You will receive a reminder letter in the mail two months in advance. If you don't receive a letter, please call our office to schedule the follow-up appointment. 

## 2013-08-06 NOTE — Assessment & Plan Note (Signed)
Blood pressure is well controlled on today's visit. No changes made to the medications. 

## 2013-08-07 ENCOUNTER — Telehealth: Payer: Self-pay | Admitting: Internal Medicine

## 2013-08-07 NOTE — Telephone Encounter (Signed)
Relevant patient education mailed to patient.  

## 2013-08-19 ENCOUNTER — Telehealth: Payer: Self-pay

## 2013-08-19 NOTE — Telephone Encounter (Signed)
Relevant patient education mailed to patient.  

## 2013-08-25 ENCOUNTER — Ambulatory Visit (INDEPENDENT_AMBULATORY_CARE_PROVIDER_SITE_OTHER): Payer: Medicare Other

## 2013-08-25 ENCOUNTER — Ambulatory Visit (INDEPENDENT_AMBULATORY_CARE_PROVIDER_SITE_OTHER): Payer: Medicare Other | Admitting: Podiatry

## 2013-08-25 VITALS — BP 137/79 | HR 77 | Resp 16

## 2013-08-25 DIAGNOSIS — M79673 Pain in unspecified foot: Secondary | ICD-10-CM

## 2013-08-25 DIAGNOSIS — M79609 Pain in unspecified limb: Secondary | ICD-10-CM

## 2013-08-25 DIAGNOSIS — B351 Tinea unguium: Secondary | ICD-10-CM

## 2013-08-25 NOTE — Progress Notes (Signed)
She presents today chief complaint of painful elongated toenails. ° °Objective: Pulses are strongly palpable bilateral. Neurologic sensorium is slightly decreased per since once the monofilament. Nails are thick yellow dystrophic with mycotic and painful palpation. ° °Assessment: Pain in limb secondary to onychomycosis 1 through 5 bilateral. ° °Plan: Debridement of nails 1 through 5 bilateral is cover service secondary to pain. Followup with her in 3 months °

## 2013-09-04 ENCOUNTER — Other Ambulatory Visit: Payer: Self-pay | Admitting: *Deleted

## 2013-09-04 MED ORDER — FUROSEMIDE 20 MG PO TABS: 20.0000 mg | ORAL_TABLET | Freq: Every day | ORAL | Status: DC

## 2013-09-09 ENCOUNTER — Telehealth: Payer: Self-pay

## 2013-09-09 NOTE — Telephone Encounter (Signed)
Pt called and would like Xarelto samples.

## 2013-09-09 NOTE — Telephone Encounter (Signed)
Placed samples of Xarelto 20 mg at front desk for pick up. 

## 2013-10-10 ENCOUNTER — Other Ambulatory Visit: Payer: Self-pay | Admitting: *Deleted

## 2013-10-10 MED ORDER — FUROSEMIDE 20 MG PO TABS: 20.0000 mg | ORAL_TABLET | Freq: Every day | ORAL | Status: DC

## 2013-10-27 ENCOUNTER — Telehealth: Payer: Self-pay | Admitting: *Deleted

## 2013-10-27 NOTE — Telephone Encounter (Signed)
Patient requesting Xarelto 20mg . Please call when ready.

## 2013-10-27 NOTE — Telephone Encounter (Signed)
Notified patient samples of xarelto 20 mg available.

## 2013-11-17 ENCOUNTER — Telehealth: Payer: Self-pay

## 2013-11-17 NOTE — Telephone Encounter (Signed)
Pt would like Xarelto samples. It is ok to leave msg.

## 2013-11-17 NOTE — Telephone Encounter (Signed)
LMOM to have patient pick up samples of xarelto 20 mg.

## 2013-11-24 ENCOUNTER — Ambulatory Visit: Payer: Medicare Other | Admitting: Podiatry

## 2013-12-08 ENCOUNTER — Other Ambulatory Visit: Payer: Self-pay | Admitting: *Deleted

## 2013-12-08 MED ORDER — BISOPROLOL-HYDROCHLOROTHIAZIDE 2.5-6.25 MG PO TABS: 1.0000 | ORAL_TABLET | Freq: Every day | ORAL | Status: DC

## 2013-12-23 ENCOUNTER — Telehealth: Payer: Self-pay | Admitting: *Deleted

## 2013-12-23 NOTE — Telephone Encounter (Signed)
Placed samples of Xarelto 20 mg @ front desk for pick up. 

## 2013-12-23 NOTE — Telephone Encounter (Signed)
Patient called wanting samples of Xarelto. Please call when ready.

## 2014-01-14 ENCOUNTER — Telehealth: Payer: Self-pay | Admitting: *Deleted

## 2014-01-14 NOTE — Telephone Encounter (Signed)
Placed Samples of Xarelto 20 mg @ front desk for pick up.

## 2014-01-14 NOTE — Telephone Encounter (Signed)
Patient wanting samples of Xarelto. Please call when ready for p/u.

## 2014-01-22 LAB — HEMOGLOBIN A1C: Hgb A1c MFr Bld: 7 % — AB (ref 4.0–6.0)

## 2014-02-10 ENCOUNTER — Telehealth: Payer: Self-pay | Admitting: Cardiovascular Disease

## 2014-02-10 NOTE — Telephone Encounter (Signed)
Spoke w/ pt.  Advised her that I am leaving samples of Xarelto 20 mg at the front desk for her to pick up at her convenience.

## 2014-02-10 NOTE — Telephone Encounter (Signed)
Pt is needing some samples of xarelto, please call when ready

## 2014-02-13 ENCOUNTER — Other Ambulatory Visit: Payer: Self-pay | Admitting: *Deleted

## 2014-02-13 MED ORDER — FUROSEMIDE 20 MG PO TABS: 20.0000 mg | ORAL_TABLET | Freq: Every day | ORAL | Status: DC

## 2014-02-19 ENCOUNTER — Encounter: Payer: Medicare Other | Admitting: Internal Medicine

## 2014-02-24 ENCOUNTER — Ambulatory Visit (INDEPENDENT_AMBULATORY_CARE_PROVIDER_SITE_OTHER): Payer: Medicare Other | Admitting: Internal Medicine

## 2014-02-24 ENCOUNTER — Telehealth: Payer: Self-pay | Admitting: Internal Medicine

## 2014-02-24 ENCOUNTER — Encounter: Payer: Self-pay | Admitting: Internal Medicine

## 2014-02-24 VITALS — BP 118/78 | HR 80 | Temp 98.2°F | Ht 63.5 in | Wt 175.0 lb

## 2014-02-24 DIAGNOSIS — I10 Essential (primary) hypertension: Secondary | ICD-10-CM

## 2014-02-24 DIAGNOSIS — E1142 Type 2 diabetes mellitus with diabetic polyneuropathy: Secondary | ICD-10-CM

## 2014-02-24 DIAGNOSIS — Z0000 Encounter for general adult medical examination without abnormal findings: Secondary | ICD-10-CM

## 2014-02-24 DIAGNOSIS — I4819 Other persistent atrial fibrillation: Secondary | ICD-10-CM

## 2014-02-24 DIAGNOSIS — E1140 Type 2 diabetes mellitus with diabetic neuropathy, unspecified: Secondary | ICD-10-CM

## 2014-02-24 DIAGNOSIS — I481 Persistent atrial fibrillation: Secondary | ICD-10-CM

## 2014-02-24 DIAGNOSIS — Z7189 Other specified counseling: Secondary | ICD-10-CM

## 2014-02-24 DIAGNOSIS — E1149 Type 2 diabetes mellitus with other diabetic neurological complication: Secondary | ICD-10-CM

## 2014-02-24 DIAGNOSIS — E785 Hyperlipidemia, unspecified: Secondary | ICD-10-CM

## 2014-02-24 LAB — CBC WITH DIFFERENTIAL/PLATELET
BASOS ABS: 0 10*3/uL (ref 0.0–0.1)
Basophils Relative: 0.4 % (ref 0.0–3.0)
Eosinophils Absolute: 0.1 10*3/uL (ref 0.0–0.7)
Eosinophils Relative: 1.1 % (ref 0.0–5.0)
HCT: 41.2 % (ref 36.0–46.0)
Hemoglobin: 13.6 g/dL (ref 12.0–15.0)
LYMPHS PCT: 26.1 % (ref 12.0–46.0)
Lymphs Abs: 1.9 10*3/uL (ref 0.7–4.0)
MCHC: 32.9 g/dL (ref 30.0–36.0)
MCV: 99.4 fl (ref 78.0–100.0)
MONO ABS: 0.5 10*3/uL (ref 0.1–1.0)
Monocytes Relative: 6.6 % (ref 3.0–12.0)
NEUTROS PCT: 65.8 % (ref 43.0–77.0)
Neutro Abs: 4.8 10*3/uL (ref 1.4–7.7)
PLATELETS: 183 10*3/uL (ref 150.0–400.0)
RBC: 4.14 Mil/uL (ref 3.87–5.11)
RDW: 14 % (ref 11.5–15.5)
WBC: 7.3 10*3/uL (ref 4.0–10.5)

## 2014-02-24 LAB — COMPREHENSIVE METABOLIC PANEL
ALBUMIN: 3.5 g/dL (ref 3.5–5.2)
ALK PHOS: 54 U/L (ref 39–117)
ALT: 9 U/L (ref 0–35)
AST: 24 U/L (ref 0–37)
BUN: 15 mg/dL (ref 6–23)
CALCIUM: 9.3 mg/dL (ref 8.4–10.5)
CO2: 32 mEq/L (ref 19–32)
Chloride: 99 mEq/L (ref 96–112)
Creatinine, Ser: 0.7 mg/dL (ref 0.4–1.2)
GFR: 86.03 mL/min (ref >60.00)
Glucose, Bld: 123 mg/dL — ABNORMAL HIGH (ref 70–99)
POTASSIUM: 4.3 meq/L (ref 3.5–5.1)
SODIUM: 138 meq/L (ref 135–145)
TOTAL PROTEIN: 7.7 g/dL (ref 6.0–8.3)
Total Bilirubin: 0.7 mg/dL (ref 0.2–1.2)

## 2014-02-24 LAB — LIPID PANEL
CHOL/HDL RATIO: 4
Cholesterol: 209 mg/dL — ABNORMAL HIGH (ref 0–200)
HDL: 51.6 mg/dL (ref >39.00)
LDL CALC: 124 mg/dL — AB (ref 0–99)
NonHDL: 157.4
Triglycerides: 168 mg/dL — ABNORMAL HIGH (ref 0.0–149.0)
VLDL: 33.6 mg/dL (ref 0.0–40.0)

## 2014-02-24 LAB — HM DIABETES FOOT EXAM

## 2014-02-24 NOTE — Assessment & Plan Note (Signed)
Good control Follows with Dr Eddie Dibbles

## 2014-02-24 NOTE — Assessment & Plan Note (Signed)
Good rate control On xarelto Will check dig level

## 2014-02-24 NOTE — Progress Notes (Signed)
Pre visit review using our clinic review tool, if applicable. No additional management support is needed unless otherwise documented below in the visit note. 

## 2014-02-24 NOTE — Assessment & Plan Note (Signed)
Biggest reason for her gait instability Needs a cane

## 2014-02-24 NOTE — Assessment & Plan Note (Signed)
On statin Due for labs

## 2014-02-24 NOTE — Assessment & Plan Note (Signed)
See social history 

## 2014-02-24 NOTE — Assessment & Plan Note (Signed)
I have personally reviewed the Medicare Annual Wellness questionnaire and have noted 1. The patient's medical and social history 2. Their use of alcohol, tobacco or illicit drugs 3. Their current medications and supplements 4. The patient's functional ability including ADL's, fall risks, home safety risks and hearing or visual             impairment. 5. Diet and physical activities 6. Evidence for depression or mood disorders  The patients weight, height, BMI and visual acuity have been recorded in the chart I have made referrals, counseling and provided education to the patient based review of the above and I have provided the pt with a written personalized care plan for preventive services.  I have provided you with a copy of your personalized plan for preventive services. Please take the time to review along with your updated medication list.  Due for prevnar---will schedule No more cancer screening--just ovarian cancer follow up--due to age Discussed fitness

## 2014-02-24 NOTE — Telephone Encounter (Signed)
She needs a nurse visit appt for a prevnar

## 2014-02-24 NOTE — Telephone Encounter (Signed)
Pt is requesting a time to come in to do the PNA Inj.

## 2014-02-24 NOTE — Progress Notes (Signed)
Subjective:    Patient ID: Jodi Reeves, female    DOB: [birth year], 80 y.o.   MRN: [id number]  HPI Here for Medicare wellness and physical Reviewed her form and advanced directives Reviewed her other doctors Did have fall onto coccyx--did have mild injury. Slipped down to sitting because she was losing balance Still does all her instrumental ADLs No tobacco or alchol No exercise! Vision and hearing are okay No major cognitive changes No depression or anhedonia--but occasional sadness due to being "slowed down"  Has noticed she is "much slower" "staggery" for years No pain or arthritis--other than coccyx Doesn't work on fitness at all Did do some balance training  Sees Dr Eddie Dibbles regularly for her diabetes Control has been good A1c 7% Ongoing neuropathy in feet---occasional pain at night   Still on thyroid med No apparent changes in hair, nails, etc Weight fairly stable  No palpitations No chest pain Still on the xarelto No dizziness or syncope--"my head is not straight at times" (neuropathy) Edema in feet at times--uses compression hose  Current Outpatient Prescriptions on File Prior to Visit  Medication Sig Dispense Refill  . Ascorbic Acid (VITAMIN C) 1000 MG tablet Take 1,000 mg by mouth daily.      . BD PEN NEEDLE NANO U/F 32G X 4 MM MISC Inject 1 Syringe as directed as needed.     . bisoprolol-hydrochlorothiazide (ZIAC) 2.5-6.25 MG per tablet Take 1 tablet by mouth daily. 90 tablet 3  . Cholecalciferol (VITAMIN D3) 2000 UNITS TABS Take by mouth daily.    . digoxin (LANOXIN) 0.125 MG tablet Take 1 tablet (125 mcg total) by mouth daily. 90 tablet 3  . fluorometholone (FML) 0.1 % ophthalmic suspension Place 1 drop into both eyes as needed.     . Folic Acid-Vit O2-DXA J28 (FA-VITAMIN B-6-VITAMIN B-12) 2.2-25-0.5 MG TABS Place under the tongue daily.      . furosemide (LASIX) 20 MG tablet Take 1 tablet (20 mg total) by mouth daily. 90 tablet 1  . glipiZIDE (GLUCOTROL)  5 MG tablet Take 5 mg by mouth daily before breakfast.    . glucose blood (ONE TOUCH ULTRA TEST) test strip Use to test blood sugar twice daily dx: 250.00 100 each 11  . insulin detemir (LEVEMIR) 100 UNIT/ML injection Inject 18 Units into the skin at bedtime.    Marland Kitchen levothyroxine (SYNTHROID, LEVOTHROID) 50 MCG tablet Take 50 mcg by mouth daily before breakfast.    . metFORMIN (GLUCOPHAGE-XR) 500 MG 24 hr tablet Take two tablet by mouth in the morning and two tablets by mouth in the evening     . polyethylene glycol powder (MIRALAX) powder Take 17 g by mouth as needed.     . Rivaroxaban (XARELTO) 20 MG TABS tablet Take 1 tablet (20 mg total) by mouth daily. 30 tablet 3   No current facility-administered medications on file prior to visit.    Allergies  Allergen Reactions  . Alendronate Sodium     REACTION: Rash  . Atorvastatin     REACTION: Muscle aches  . Codeine     REACTION: N \\T \ V  . Ezetimibe-Simvastatin     REACTION: Muscle aches  . Lovastatin     REACTION: Myalgias  . Penicillins     REACTION: Anaphylaxis  . Rosiglitazone Maleate     REACTION: Edema    Past Medical History  Diagnosis Date  . Atrial fibrillation   . DM2 (diabetes mellitus, type 2)   . HLD (hyperlipidemia)   .  HTN (hypertension)   . Hypothyroidism   . Osteoporosis   . Anxiety   . GERD (gastroesophageal reflux disease)     esophageal web  . Ovarian cancer   . Granuloma annulare     [name].     Past Surgical History  Procedure Laterality Date  . Femoral hernia repair  1958    R  . Stillbirth  1969  . Laminectomy l4-5  1971  . Total abdominal hysterectomy w/ bilateral salpingoophorectomy  1980    ovarian cancer  . 2nd look laparotomy  1982  . Cholecystectomy  1986  . Kidney stone x2  1991  . Fracture l elbow/wrist  2000  . Intussception/obstruction  12/98  . Wrist fracture surgery  11/05    R  . Cardiac catheterization  8/05    neg; a fib found   . Cataract od  2002  . Myoview  stress  8/05    syncope (-)  . Egd/dilation/colon  1/06  . Dexa  9/04 and 1/02  . Cataract surgery  5/07    R  . Abdominal hysterectomy      Family History  Problem Relation Age of Onset  . Family history unknown: Yes    History   Social History  . Marital Status: Widowed    Spouse Name: N/A    Number of Children: 1  . Years of Education: N/A   Occupational History  . 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 Smoking status: Never Smoker   . Smokeless tobacco: Never Used  . Alcohol Use: No  . Drug Use: No  . Sexual Activity: Not on file   Other Topics Concern  . Not on file   Social History Narrative   Has living will   Son is healthcare POA   Would accept CPR but not prolonged artificial ventilation    No feeding tube if cognitively unaware   Review of Systems Sleeps fine Continues to see gyn oncologist yearly Bowels okay-- does get cramps at times and churning. Will have urgency but not that much recently. Rare fecal incontinence Voids okay--- will have rare urge dribbling (but no serious incontinence) Wears seat belt     Objective:   Physical Exam  Constitutional: She appears well-developed and well-nourished. No distress.  Neck: Normal range of motion. No thyromegaly present.  Cardiovascular: Normal rate and normal heart sounds.  Exam reveals no gallop.   No murmur heard. irregular  Pulmonary/Chest: Effort normal and breath sounds normal. No respiratory distress. She has no wheezes. She has no rales.  Abdominal: Soft. There is no tenderness.  Musculoskeletal: She exhibits no tenderness.  2+ non pitting edema in calves  Lymphadenopathy:    She has no cervical adenopathy.  Neurological:  President-- "Obama, Clinton, ...then Bush" 100-94-.Marland KitchenMarland KitchenMarland KitchenMarland Kitchen D-l-r-o-w Recall 3/3  Decreased sensation in feet  Psychiatric: She has a normal mood and affect. Her behavior is normal.          Assessment & Plan:

## 2014-02-24 NOTE — Telephone Encounter (Signed)
Yes it needs to be after 03/02/14, just a nurse visit

## 2014-02-24 NOTE — Assessment & Plan Note (Signed)
BP Readings from Last 3 Encounters:  02/24/14 118/78  08/25/13 137/79  08/06/13 122/80   Good control

## 2014-02-24 NOTE — Telephone Encounter (Signed)
So all she needs is for [place] to schedule the visit? She stated that she was waiting on Dr Silvio Pate to let her know when she could do it because of other injections that she had?

## 2014-02-25 ENCOUNTER — Encounter: Payer: Self-pay | Admitting: *Deleted

## 2014-02-25 LAB — DIGOXIN LEVEL: Digoxin Level: 1 ng/mL (ref 0.8–2.0)

## 2014-03-23 ENCOUNTER — Telehealth: Payer: Self-pay | Admitting: Cardiovascular Disease

## 2014-03-23 NOTE — Telephone Encounter (Signed)
Pt would like some samples of xeltro. Pt is having discomfort in chest and is concerned it may be connected with her heart. She able to do most things, she is just getting tightness and burning sometimes. It does not happen often.

## 2014-03-23 NOTE — Telephone Encounter (Signed)
Left message for pt to call back  °

## 2014-03-23 NOTE — Telephone Encounter (Signed)
Spoke w/ pt.  She states that she mainly called to inquire about xarelto samples.  She states that she has episodes of chest discomfort, is not in any kind of immediate need, and would like an appt to see Dr. Rockey Situ sooner than April. Pt sched to see Dr. Rockey Situ 03/26/14 @ 2:00.  She would like to pick up xarelto samples at that time.

## 2014-03-24 ENCOUNTER — Ambulatory Visit: Payer: Self-pay | Admitting: Obstetrics & Gynecology

## 2014-03-26 ENCOUNTER — Ambulatory Visit (INDEPENDENT_AMBULATORY_CARE_PROVIDER_SITE_OTHER): Payer: Medicare Other | Admitting: Cardiovascular Disease

## 2014-03-26 ENCOUNTER — Encounter: Payer: Self-pay | Admitting: Cardiovascular Disease

## 2014-03-26 VITALS — BP 152/86 | HR 81 | Ht 64.0 in | Wt 172.8 lb

## 2014-03-26 DIAGNOSIS — I4819 Other persistent atrial fibrillation: Secondary | ICD-10-CM

## 2014-03-26 DIAGNOSIS — R079 Chest pain, unspecified: Secondary | ICD-10-CM

## 2014-03-26 DIAGNOSIS — I5032 Chronic diastolic (congestive) heart failure: Secondary | ICD-10-CM

## 2014-03-26 DIAGNOSIS — I209 Angina pectoris, unspecified: Secondary | ICD-10-CM

## 2014-03-26 DIAGNOSIS — R0602 Shortness of breath: Secondary | ICD-10-CM

## 2014-03-26 DIAGNOSIS — I10 Essential (primary) hypertension: Secondary | ICD-10-CM

## 2014-03-26 DIAGNOSIS — I481 Persistent atrial fibrillation: Secondary | ICD-10-CM

## 2014-03-26 LAB — HEMOGLOBIN A1C: Hgb A1c MFr Bld: 7.4 % — AB (ref 4.0–6.0)

## 2014-03-26 MED ORDER — FUROSEMIDE 20 MG PO TABS: 20.0000 mg | ORAL_TABLET | Freq: Two times a day (BID) | ORAL | Status: DC | PRN

## 2014-03-26 NOTE — Patient Instructions (Addendum)
Please increase the lasix up to twice a day as needed for shortness of breath Consider taking Lasix twice a day for the next 3 days then extra as needed  We will schedule a lexiscan stress test Next Wednesday  The morning of the stress test: hold the bisopsolol, lasix , glipizide   Please call if symptoms get worse  Please call us if you have new issues that need to be addressed before your next appt.  Your physician wants you to follow-up in: 6 months.  You will receive a reminder letter in the mail two months in advance. If you don't receive a letter, please call our office to schedule the follow-up appointment.  [facility]  Your caregiver has ordered a Stress Test with nuclear imaging. The purpose of this test is to evaluate the blood supply to your heart muscle. This procedure is referred to as a "Non-Invasive Stress Test." This is because other than having an IV started in your vein, nothing is inserted or "invades" your body. Cardiac stress tests are done to find areas of poor blood flow to the heart by determining the extent of coronary artery disease (CAD). Some patients exercise on a treadmill, which naturally increases the blood flow to your heart, while others who are  unable to walk on a treadmill due to physical limitations have a pharmacologic/chemical stress agent called Lexiscan . This medicine will mimic walking on a treadmill by temporarily increasing your coronary blood flow.   Please note: these test may take anywhere between 2-4 hours to complete  PLEASE REPORT TO [facility] AT THE FIRST DESK WILL DIRECT YOU WHERE TO GO  Date of Procedure:______Wednesday, December 9____  Arrival Time for Procedure:_______7:45 am____________  Instructions regarding medication:   __X__:  Hold other medications as follows:_________ bisopsolol, lasix , glipizide ____________    PLEASE NOTIFY THE OFFICE AT LEAST 24 HOURS IN ADVANCE IF YOU ARE UNABLE TO  KEEP YOUR APPOINTMENT.  [phone] AND  PLEASE NOTIFY NUCLEAR MEDICINE AT [facility] AT LEAST 24 HOURS IN ADVANCE IF YOU ARE UNABLE TO KEEP YOUR APPOINTMENT. [phone]  How to prepare for your Myoview test:  1. Do not eat or drink after midnight 2. No caffeine for 24 hours prior to test 3. No smoking 24 hours prior to test. 4. Your medication may be taken with water.  If your doctor stopped a medication because of this test, do not take that medication. 5. Ladies, please do not wear dresses.  Skirts or pants are appropriate. Please wear a short sleeve shirt. 6. No perfume, cologne or lotion. 7. Wear comfortable walking shoes. No heels!

## 2014-03-26 NOTE — Progress Notes (Signed)
Patient ID: Jodi Reeves, female    DOB: [birth year], 80 y.o.   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 No intervention was needed.  She presents for follow-up of her atrial fibrillation  In follow-up today, she reports that she has been having some occasional shortness of breath at rest, chest pain described as a tightness in her mediastinal area that seems to come on at rest, sometimes with exertion. Symptoms will happen and notable while working and walking. She is concerned given her previously known coronary artery disease She continues to have coordination and balance issues, she has completed working with a physical therapist . No recent falls.  She is tolerating anticoagulation with no problems . She has been on Xarelto   She did not want a cholesterol medication  Review of prior catheterization with her showed 40% RCA disease  EKG done on today's visit shows   atrial fibrillation with ventricular rate 81 bpm, new T-wave abnormality in leads V3 through V5  Other past medical history  She wears compression hose frequently for nonpitting edema, likely venous insufficiency.   She had amiodarone many years ago and she believes that this caused her thyroid problem.    Outpatient Encounter Prescriptions as of 03/26/2014  Medication Sig  . Ascorbic Acid (VITAMIN C) 1000 MG tablet Take 1,000 mg by mouth daily.    . BD PEN NEEDLE NANO U/F 32G X 4 MM MISC Inject 1 Syringe as directed as needed.   . bisoprolol-hydrochlorothiazide (ZIAC) 2.5-6.25 MG per tablet Take 1 tablet by mouth daily.  . Cholecalciferol (VITAMIN D3) 2000 UNITS TABS Take by mouth daily.  . digoxin (LANOXIN) 0.125 MG tablet Take 1 tablet (125 mcg total) by mouth daily.  .  fluorometholone (FML) 0.1 % ophthalmic suspension Place 1 drop into both eyes as needed.   . Folic Acid-Vit V6-HYW V37 (FA-VITAMIN B-6-VITAMIN B-12) 2.2-25-0.5 MG TABS Place under the tongue daily.    . furosemide (LASIX) 20 MG tablet Take 1 tablet (20 mg total) by mouth daily.  Marland Kitchen glipiZIDE (GLUCOTROL) 5 MG tablet Take 5 mg by mouth daily before breakfast.  . glucose blood (ONE TOUCH ULTRA TEST) test strip Use to test blood sugar twice daily dx: 250.00  . insulin detemir (LEVEMIR) 100 UNIT/ML injection Inject 18 Units into the skin at bedtime.  Marland Kitchen levothyroxine (SYNTHROID, LEVOTHROID) 50 MCG tablet Take 50 mcg by mouth daily before breakfast.  . metFORMIN (GLUCOPHAGE-XR) 500 MG 24 hr tablet Take two tablet by mouth in the morning and two tablets by mouth in the evening   . polyethylene glycol powder (MIRALAX) powder Take 17 g by mouth as needed.   . Rivaroxaban (XARELTO) 20 MG TABS tablet Take 1 tablet (20 mg total) by mouth daily.    Social history  reports that she has never smoked. She has never used smokeless tobacco. She reports that she does not drink alcohol or use illicit drugs.   Review of Systems  Constitutional: Negative.   Eyes: Negative.   Respiratory: Positive for chest tightness and shortness of breath.   Cardiovascular: Negative.   Gastrointestinal: Negative.   Musculoskeletal: Positive for myalgias, back pain and gait problem.  Neurological: Negative.   Hematological: Negative.   Psychiatric/Behavioral: Negative.   All other systems reviewed and are negative.   BP 152/86  mmHg  Pulse 81  Ht 5\' 4"  (1.626 m)  Wt 172 lb 12 oz (78.359 kg)  BMI 29.64 kg/m2  Physical Exam  Constitutional: She is oriented to person, place, and time. She appears well-developed and well-nourished.  HENT:  Head: Normocephalic.  Nose: Nose normal.  Mouth/Throat: Oropharynx is clear and moist.  Eyes: Conjunctivae are normal. Pupils are equal, round, and reactive to light.  Neck: Normal  range of motion. Neck supple. No JVD present.  Cardiovascular: Normal rate, regular rhythm, S1 normal, S2 normal, normal heart sounds and intact distal pulses.  Exam reveals no gallop and no friction rub.   No murmur heard. Pulmonary/Chest: Effort normal and breath sounds normal. No respiratory distress. She has no wheezes. She has no rales. She exhibits no tenderness.  Abdominal: Soft. Bowel sounds are normal. She exhibits no distension. There is no tenderness.  Musculoskeletal: Normal range of motion. She exhibits no edema or tenderness.  Lymphadenopathy:    She has no cervical adenopathy.  Neurological: She is alert and oriented to person, place, and time. Coordination normal.  Skin: Skin is warm and dry. No rash noted. No erythema.  Psychiatric: She has a normal mood and affect. Her behavior is normal. Judgment and thought content normal.    Assessment and Plan  Nursing note and vitals reviewed.

## 2014-03-27 DIAGNOSIS — R0602 Shortness of breath: Secondary | ICD-10-CM | POA: Insufficient documentation

## 2014-03-27 DIAGNOSIS — I5032 Chronic diastolic (congestive) heart failure: Secondary | ICD-10-CM | POA: Insufficient documentation

## 2014-03-27 DIAGNOSIS — R079 Chest pain, unspecified: Secondary | ICD-10-CM | POA: Insufficient documentation

## 2014-03-27 NOTE — Assessment & Plan Note (Signed)
Heart rate relatively well-controlled. New EKG changes concerning for ischemia. Tolerating anticoagulation

## 2014-03-27 NOTE — Assessment & Plan Note (Signed)
Etiology of her symptoms is unclear. Concerning for ischemia. Stress test has been ordered If no ischemia noted, could consider echocardiogram to evaluate ejection fraction, right heart pressures She will increase her Lasix up to 20 Mill grams twice a day. Possible acute on chronic diastolic CHF

## 2014-03-27 NOTE — Assessment & Plan Note (Signed)
Blood pressure is elevated today. On recheck, blood pressure improved down to 130s. No blood pressure medication changes made today We'll continue to monitor her blood pressure at home

## 2014-03-27 NOTE — Assessment & Plan Note (Signed)
Chest pain symptoms per the patient, also shortness of breath. Previously seen coronary artery disease Stress test has been ordered. She is unable to treadmill. Pharmacological study ordered

## 2014-03-27 NOTE — Assessment & Plan Note (Signed)
She is tolerating Lasix 20 Mill grams daily. Given her worsening shortness of breath, concern for does like CHF, we will increase Lasix up to 20 mg twice a day for the next week to see if this helps her symptoms

## 2014-04-01 ENCOUNTER — Ambulatory Visit: Payer: Self-pay | Admitting: Cardiovascular Disease

## 2014-04-01 DIAGNOSIS — R079 Chest pain, unspecified: Secondary | ICD-10-CM

## 2014-04-02 ENCOUNTER — Other Ambulatory Visit: Payer: Self-pay

## 2014-04-02 DIAGNOSIS — R0602 Shortness of breath: Secondary | ICD-10-CM

## 2014-04-02 DIAGNOSIS — I4819 Other persistent atrial fibrillation: Secondary | ICD-10-CM

## 2014-04-02 DIAGNOSIS — R079 Chest pain, unspecified: Secondary | ICD-10-CM

## 2014-04-03 ENCOUNTER — Other Ambulatory Visit: Payer: Self-pay | Admitting: *Deleted

## 2014-04-03 MED ORDER — GLUCOSE BLOOD VI STRP: ORAL_STRIP | Status: DC

## 2014-04-08 ENCOUNTER — Other Ambulatory Visit: Payer: Self-pay | Admitting: *Deleted

## 2014-04-08 MED ORDER — DIGOXIN 125 MCG PO TABS: 125.0000 ug | ORAL_TABLET | Freq: Every day | ORAL | Status: DC

## 2014-04-09 ENCOUNTER — Telehealth: Payer: Self-pay | Admitting: *Deleted

## 2014-04-09 NOTE — Telephone Encounter (Signed)
° ° ° °

## 2014-04-10 NOTE — Telephone Encounter (Signed)
Please see result note 

## 2014-04-14 ENCOUNTER — Encounter: Payer: Self-pay | Admitting: Internal Medicine

## 2014-04-16 ENCOUNTER — Other Ambulatory Visit: Payer: Self-pay

## 2014-04-16 MED ORDER — RIVAROXABAN 20 MG PO TABS: 20.0000 mg | ORAL_TABLET | Freq: Every day | ORAL | Status: DC

## 2014-04-16 NOTE — Telephone Encounter (Signed)
Refill sent for xarelto

## 2014-05-11 ENCOUNTER — Telehealth: Payer: Self-pay

## 2014-05-11 NOTE — Telephone Encounter (Signed)
Pt would like Xarelto samples.  

## 2014-05-12 NOTE — Telephone Encounter (Signed)
Xarelto 20 mg samples placed at front desk for pick up. 

## 2014-05-12 NOTE — Telephone Encounter (Signed)
Pt called back, states that she has one pill left. Would like to get samples today if possible

## 2014-06-11 ENCOUNTER — Encounter: Payer: Self-pay | Admitting: Cardiovascular Disease

## 2014-06-11 ENCOUNTER — Ambulatory Visit (INDEPENDENT_AMBULATORY_CARE_PROVIDER_SITE_OTHER): Payer: Medicare Other | Admitting: Cardiovascular Disease

## 2014-06-11 VITALS — BP 120/82 | HR 87 | Ht 64.0 in | Wt 176.0 lb

## 2014-06-11 DIAGNOSIS — R2681 Unsteadiness on feet: Secondary | ICD-10-CM

## 2014-06-11 DIAGNOSIS — E785 Hyperlipidemia, unspecified: Secondary | ICD-10-CM

## 2014-06-11 DIAGNOSIS — I5032 Chronic diastolic (congestive) heart failure: Secondary | ICD-10-CM

## 2014-06-11 DIAGNOSIS — I10 Essential (primary) hypertension: Secondary | ICD-10-CM

## 2014-06-11 DIAGNOSIS — I4891 Unspecified atrial fibrillation: Secondary | ICD-10-CM

## 2014-06-11 MED ORDER — FUROSEMIDE 20 MG PO TABS: 20.0000 mg | ORAL_TABLET | Freq: Two times a day (BID) | ORAL | Status: DC | PRN

## 2014-06-11 NOTE — Assessment & Plan Note (Signed)
She is tolerating Lasix 20 mg daily. Shortness of breath is stable

## 2014-06-11 NOTE — Assessment & Plan Note (Signed)
Blood pressure is well controlled on today's visit. No changes made to the medications. 

## 2014-06-11 NOTE — Patient Instructions (Signed)
You are doing well. No medication changes were made.  Please call us if you have new issues that need to be addressed before your next appt.  Your physician wants you to follow-up in: 6 months.  You will receive a reminder letter in the mail two months in advance. If you don't receive a letter, please call our office to schedule the follow-up appointment.   

## 2014-06-11 NOTE — Assessment & Plan Note (Signed)
She does not want a cholesterol medication. 

## 2014-06-11 NOTE — Assessment & Plan Note (Signed)
Recent fall with head injury. Recommended she continue her PT/exercise program

## 2014-06-11 NOTE — Assessment & Plan Note (Signed)
Heart rate well controlled. Tolerating anticoagulation

## 2014-06-11 NOTE — Progress Notes (Signed)
Patient ID: Jodi Reeves, female    DOB: 12-Apr-1928, 81 y.o.   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 No intervention was needed.  She presents for follow-up of her atrial fibrillation  In follow-up today, she reports that she recently had a mechanical fall at church while she was hanging up her coat last Saturday. 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 She has been working out at the recreational center She denies any significant chest pain since her fall. Long discussion today over her previous stress test results. She has some discomfort in the chest but commonly before or after eating. No symptoms with exertion In the past did not want a cholesterol medication. Tolerating xarelto.  EKG today shows atrial fibrillation with rate 87 bpm, no significant ST or T-wave changes  Other past medical history  Review of prior catheterization with her showed 40% RCA disease  She wears compression hose frequently for nonpitting edema, likely venous insufficiency.   She had amiodarone many years ago and she believes that this caused her thyroid problem.    Allergies  Allergen Reactions  . Alendronate Sodium     REACTION: Rash  . Atorvastatin     REACTION: Muscle aches  . Codeine     REACTION: N \\T \ V  . Ezetimibe-Simvastatin     REACTION: Muscle aches  . Lovastatin     REACTION: Myalgias  . Penicillins     REACTION: Anaphylaxis  . Rosiglitazone Maleate     REACTION: Edema    Outpatient Encounter Prescriptions as of  06/11/2014  Medication Sig  . Ascorbic Acid (VITAMIN C) 1000 MG tablet Take 1,000 mg by mouth daily.    . BD PEN NEEDLE NANO U/F 32G X 4 MM MISC Inject 1 Syringe as directed as needed.   . bisoprolol-hydrochlorothiazide (ZIAC) 2.5-6.25 MG per tablet Take 1 tablet by mouth daily.  . Cholecalciferol (VITAMIN D3) 2000 UNITS TABS Take by mouth daily.  . digoxin (LANOXIN) 0.125 MG tablet Take 1 tablet (125 mcg total) by mouth daily.  . fluorometholone (FML) 0.1 % ophthalmic suspension Place 1 drop into both eyes as needed.   . Folic Acid-Vit F3-LKT G25 (FA-VITAMIN B-6-VITAMIN B-12) 2.2-25-0.5 MG TABS Place under the tongue daily.    . furosemide (LASIX) 20 MG tablet Take 1 tablet (20 mg total) by mouth 2 (two) times daily as needed.  Marland Kitchen glipiZIDE (GLUCOTROL) 5 MG tablet Take 5 mg by mouth daily before breakfast.  . glucose blood (ONE TOUCH ULTRA TEST) test strip Use to test blood sugar twice daily dx: 250.00  . insulin NPH Human (HUMULIN N,NOVOLIN N) 100 UNIT/ML injection Inject 20 Units into the skin at bedtime.   Marland Kitchen levothyroxine (SYNTHROID, LEVOTHROID) 50 MCG tablet Take 50 mcg by mouth daily before breakfast.  . metFORMIN (GLUCOPHAGE-XR) 500 MG 24 hr tablet Take two tablet by mouth in the morning and two tablets by mouth in the evening   . polyethylene glycol powder (MIRALAX) powder Take 17 g by mouth as needed.   . rivaroxaban (XARELTO) 20 MG TABS tablet Take  1 tablet (20 mg total) by mouth daily.  . [DISCONTINUED] furosemide (LASIX) 20 MG tablet Take 1 tablet (20 mg total) by mouth 2 (two) times daily as needed.  . [DISCONTINUED] insulin detemir (LEVEMIR) 100 UNIT/ML injection Inject 18 Units into the skin at bedtime.    Past Medical History  Diagnosis Date  . Atrial fibrillation   . DM2 (diabetes mellitus, type 2)   . HLD (hyperlipidemia)   . HTN (hypertension)   . Hypothyroidism   . Osteoporosis   . Anxiety   . GERD (gastroesophageal reflux disease)     esophageal web  . Ovarian  cancer   . Granuloma annulare     [name].     Past Surgical History  Procedure Laterality Date  . Femoral hernia repair  1958    R  . Stillbirth  1969  . Laminectomy l4-5  1971  . Total abdominal hysterectomy w/ bilateral salpingoophorectomy  1980    ovarian cancer  . 2nd look laparotomy  1982  . Cholecystectomy  1986  . Kidney stone x2  1991  . Fracture l elbow/wrist  2000  . Intussception/obstruction  12/98  . Wrist fracture surgery  11/05    R  . Cardiac catheterization  8/05    neg; a fib found   . Cataract od  2002  . Myoview stress  8/05    syncope (-)  . Egd/dilation/colon  1/06  . Dexa  9/04 and 1/02  . Cataract surgery  5/07    R  . Abdominal hysterectomy      Social History  reports that she has never smoked. She has never used smokeless tobacco. She reports that she does not drink alcohol or use illicit drugs.  Family History Family history is unknown by patient.     Review of Systems  Constitutional: Negative.   Eyes: Negative.   Respiratory: Positive for chest tightness and shortness of breath.   Cardiovascular: Negative.   Gastrointestinal: Negative.   Musculoskeletal: Positive for myalgias, back pain and gait problem.  Neurological: Negative.   Hematological: Negative.   Psychiatric/Behavioral: Negative.   All other systems reviewed and are negative.   BP 120/82 mmHg  Pulse 87  Ht 5\' 4"  (1.626 m)  Wt 176 lb (79.833 kg)  BMI 30.20 kg/m2  Physical Exam  Constitutional: She is oriented to person, place, and time. She appears well-developed and well-nourished.  HENT:  Head: Normocephalic.  Nose: Nose normal.  Mouth/Throat: Oropharynx is clear and moist.  Eyes: Conjunctivae are normal. Pupils are equal, round, and reactive to light.  Neck: Normal range of motion. Neck supple. No JVD present.  Cardiovascular: Normal rate, regular rhythm, S1 normal, S2 normal, normal heart sounds and intact distal pulses.  Exam reveals no gallop and  no friction rub.   No murmur heard. Pulmonary/Chest: Effort normal and breath sounds normal. No respiratory distress. She has no wheezes. She has no rales. She exhibits no tenderness.  Abdominal: Soft. Bowel sounds are normal. She exhibits no distension. There is no tenderness.  Musculoskeletal: Normal range of motion. She exhibits no edema or tenderness.  Lymphadenopathy:    She has no cervical adenopathy.  Neurological: She is alert and oriented to person, place, and time. Coordination normal.  Skin: Skin is warm and dry. No rash noted. No erythema.  Psychiatric: She has a normal mood and affect. Her behavior is normal. Judgment and thought content normal.    Assessment and Plan  Nursing note and vitals reviewed.

## 2014-06-23 LAB — HEMOGLOBIN A1C: HEMOGLOBIN A1C: 6.8 % — AB (ref 4.0–6.0)

## 2014-06-29 ENCOUNTER — Telehealth: Payer: Self-pay

## 2014-06-29 NOTE — Telephone Encounter (Signed)
Received risk assessment form from [facility] requesting pt to d/c Xarelto 3 days prior to colonoscopy/EGD on 07/13/14 w/ Dr. Verdie Shire.  Per Christell Faith, PA, pt may d/c 1-2 days prior to procedure.  Faxed to [facility] at [phone].

## 2014-07-13 ENCOUNTER — Ambulatory Visit: Payer: Self-pay | Admitting: Gastroenterology

## 2014-07-22 ENCOUNTER — Telehealth: Payer: Self-pay

## 2014-07-22 NOTE — Telephone Encounter (Signed)
Pt would like Xarelto samples.  

## 2014-07-22 NOTE — Telephone Encounter (Signed)
Placed samples of Xarelto at the front desk for pick up.

## 2014-07-27 ENCOUNTER — Ambulatory Visit: Payer: Medicare Other

## 2014-08-04 ENCOUNTER — Encounter: Payer: Self-pay | Admitting: Internal Medicine

## 2014-08-05 ENCOUNTER — Encounter: Payer: Self-pay | Admitting: Internal Medicine

## 2014-08-07 ENCOUNTER — Telehealth: Payer: Self-pay | Admitting: *Deleted

## 2014-08-07 NOTE — Telephone Encounter (Signed)
Patient may discontinue Xarelto 2 day prior to procedure. Restart 24 hour post procedure, when hemostasis has been obtained, or when DDS says.

## 2014-08-07 NOTE — Telephone Encounter (Signed)
Request for surgical clearance:  1. What type of surgery is being performed? Gum surgery   2. When is this surgery scheduled? Hasn't been scheduled yet  3. Are there any medications that need to be held prior to surgery and how long? Xarelto  4. Name of physician performing surgery? Dr. Loyal Gambler  5. What is your office phone and fax number? [phone]  Fax# [phone].

## 2014-08-10 NOTE — Telephone Encounter (Signed)
Clearance letter typed and routed to Dr. Loyal Gambler.

## 2014-08-12 ENCOUNTER — Telehealth: Payer: Self-pay | Admitting: *Deleted

## 2014-08-12 NOTE — Telephone Encounter (Signed)
Placed samples of Xarelto 20 mg @ front desk for pick up.

## 2014-08-12 NOTE — Telephone Encounter (Signed)
Pt is asking for samples of Xarelto.

## 2014-08-17 LAB — SURGICAL PATHOLOGY

## 2014-08-19 ENCOUNTER — Encounter: Payer: Self-pay | Admitting: Internal Medicine

## 2014-08-24 ENCOUNTER — Ambulatory Visit (INDEPENDENT_AMBULATORY_CARE_PROVIDER_SITE_OTHER): Payer: Medicare Other | Admitting: Podiatry

## 2014-08-24 DIAGNOSIS — M79676 Pain in unspecified toe(s): Secondary | ICD-10-CM

## 2014-08-24 DIAGNOSIS — B351 Tinea unguium: Secondary | ICD-10-CM

## 2014-08-24 NOTE — Progress Notes (Signed)
She presents today chief complaint of painful elongated toenails.  Objective: Pulses are strongly palpable bilateral. Neurologic sensorium is slightly decreased per since once the monofilament. Nails are thick yellow dystrophic with mycotic and painful palpation.  Assessment: Pain in limb secondary to onychomycosis 1 through 5 bilateral.  Plan: Debridement of nails 1 through 5 bilateral is cover service secondary to pain. Followup with her in 3 months

## 2014-09-14 ENCOUNTER — Telehealth: Payer: Self-pay | Admitting: Cardiovascular Disease

## 2014-09-14 NOTE — Telephone Encounter (Signed)
Placed samples of Xarelto 20 mg at front desk for pick up.

## 2014-09-14 NOTE — Telephone Encounter (Signed)
Patient needs samples:  Xarelto 20 mg PO Daily .  Please call when ready.

## 2014-09-22 ENCOUNTER — Ambulatory Visit: Payer: Medicare Other | Admitting: Cardiovascular Disease

## 2014-09-24 ENCOUNTER — Encounter: Payer: Self-pay | Admitting: Occupational Medicine

## 2014-09-24 ENCOUNTER — Emergency Department: Payer: Medicare Other

## 2014-09-24 ENCOUNTER — Emergency Department
Admission: EM | Admit: 2014-09-24 | Discharge: 2014-09-24 | Disposition: A | Discharge status: Home / Self Care | Payer: Medicare Other | Attending: Emergency Medicine | Admitting: Emergency Medicine

## 2014-09-24 DIAGNOSIS — W1839XA Other fall on same level, initial encounter: Secondary | ICD-10-CM | POA: Diagnosis not present

## 2014-09-24 DIAGNOSIS — Y998 Other external cause status: Secondary | ICD-10-CM | POA: Diagnosis not present

## 2014-09-24 DIAGNOSIS — S42302A Unspecified fracture of shaft of humerus, left arm, initial encounter for closed fracture: Secondary | ICD-10-CM

## 2014-09-24 DIAGNOSIS — Z794 Long term (current) use of insulin: Secondary | ICD-10-CM | POA: Insufficient documentation

## 2014-09-24 DIAGNOSIS — Y9289 Other specified places as the place of occurrence of the external cause: Secondary | ICD-10-CM | POA: Diagnosis not present

## 2014-09-24 DIAGNOSIS — Y9389 Activity, other specified: Secondary | ICD-10-CM | POA: Insufficient documentation

## 2014-09-24 DIAGNOSIS — S42292A Other displaced fracture of upper end of left humerus, initial encounter for closed fracture: Secondary | ICD-10-CM | POA: Insufficient documentation

## 2014-09-24 DIAGNOSIS — E1142 Type 2 diabetes mellitus with diabetic polyneuropathy: Secondary | ICD-10-CM | POA: Insufficient documentation

## 2014-09-24 DIAGNOSIS — E1149 Type 2 diabetes mellitus with other diabetic neurological complication: Secondary | ICD-10-CM | POA: Diagnosis not present

## 2014-09-24 DIAGNOSIS — I10 Essential (primary) hypertension: Secondary | ICD-10-CM | POA: Insufficient documentation

## 2014-09-24 DIAGNOSIS — R110 Nausea: Secondary | ICD-10-CM | POA: Diagnosis not present

## 2014-09-24 DIAGNOSIS — S4992XA Unspecified injury of left shoulder and upper arm, initial encounter: Secondary | ICD-10-CM | POA: Diagnosis present

## 2014-09-24 DIAGNOSIS — Z880 Allergy status to penicillin: Secondary | ICD-10-CM | POA: Insufficient documentation

## 2014-09-24 DIAGNOSIS — Z79899 Other long term (current) drug therapy: Secondary | ICD-10-CM | POA: Diagnosis not present

## 2014-09-24 DIAGNOSIS — R630 Anorexia: Secondary | ICD-10-CM | POA: Diagnosis not present

## 2014-09-24 MED ORDER — ONDANSETRON HCL 4 MG PO TABS: 4.0000 mg | ORAL_TABLET | Freq: Every day | ORAL | Status: DC | PRN

## 2014-09-24 MED ORDER — ONDANSETRON 4 MG PO TBDP
ORAL_TABLET | ORAL | Status: AC
Start: 2014-09-24 — End: 2014-09-24
  Administered 2014-09-24: 4 mg via ORAL
  Filled 2014-09-24: qty 1

## 2014-09-24 MED ORDER — ONDANSETRON 4 MG PO TBDP
4.0000 mg | ORAL_TABLET | Freq: Once | ORAL | Status: AC
  Administered 2014-09-24: 4 mg via ORAL

## 2014-09-24 NOTE — ED Notes (Signed)
Pt has been weak nauseous and vomiting yesterday thinks its due to tooth infection

## 2014-09-24 NOTE — ED Provider Notes (Signed)
[facility] Emergency Department Provider Note  ____________________________________________  Time seen: Approximately 10:04 PM  I have reviewed the triage vital signs and the nursing notes.   HISTORY  Chief Complaint Fall    HPI Jodi Reeves is a 82 y.o. female who fell prior to arrival landing on her left shoulder. She has had severe pain since the fall with inability to move the left shoulder. She also is currently being treated for a dental infection with clindamycin. She has mild swelling to the left jaw that is improving. Her appetite has been down since taking antibiotics and she has mild nausea. However she feels the swelling is improving.   Past Medical History  Diagnosis Date  . Atrial fibrillation   . DM2 (diabetes mellitus, type 2)   . HLD (hyperlipidemia)   . HTN (hypertension)   . Hypothyroidism   . Osteoporosis   . Anxiety   . GERD (gastroesophageal reflux disease)     esophageal web  . Ovarian cancer   . Granuloma annulare     [name].     Patient Active Problem List   Diagnosis Date Noted  . Chest pain 03/27/2014  . Shortness of breath 03/27/2014  . Chronic diastolic CHF (congestive heart failure) 03/27/2014  . Advanced directives, counseling/discussion 02/24/2014  . Routine general medical examination at a health care facility 02/17/2013  . Diabetes, polyneuropathy 02/17/2013  . Gait instability 04/11/2012  . Atrial fibrillation 04/11/2012  . Edema 11/14/2010  . OTHER CONSTIPATION 05/31/2007  . VENTRAL HERNIA 03/15/2007  . NEOP, MALIGNANT, OVARY 11/20/2006  . HYPOTHYROIDISM 11/20/2006  . Type 2 diabetes mellitus with neurological manifestations, controlled 11/20/2006  . Hyperlipemia 11/20/2006  . ANXIETY 11/20/2006  . Essential hypertension, benign 11/20/2006  . OSTEOPOROSIS 11/20/2006    Past Surgical History  Procedure Laterality Date  . Femoral hernia repair  1958    R  . Stillbirth  1969  .  Laminectomy l4-5  1971  . Total abdominal hysterectomy w/ bilateral salpingoophorectomy  1980    ovarian cancer  . 2nd look laparotomy  1982  . Cholecystectomy  1986  . Kidney stone x2  1991  . Fracture l elbow/wrist  2000  . Intussception/obstruction  12/98  . Wrist fracture surgery  11/05    R  . Cardiac catheterization  8/05    neg; a fib found   . Cataract od  2002  . Myoview stress  8/05    syncope (-)  . Egd/dilation/colon  1/06  . Dexa  9/04 and 1/02  . Cataract surgery  5/07    R  . Abdominal hysterectomy      Current Outpatient Rx  Name  Route  Sig  Dispense  Refill  . Ascorbic Acid (VITAMIN C) 1000 MG tablet   Oral   Take 1,000 mg by mouth daily.           . BD PEN NEEDLE NANO U/F 32G X 4 MM MISC   Injection   Inject 1 Syringe as directed as needed.          . bisoprolol-hydrochlorothiazide (ZIAC) 2.5-6.25 MG per tablet   Oral   Take 1 tablet by mouth daily.   90 tablet   3   . Cholecalciferol (VITAMIN D3) 2000 UNITS TABS   Oral   Take by mouth daily.         . digoxin (LANOXIN) 0.125 MG tablet   Oral   Take 1 tablet (125 mcg total) by  mouth daily.   90 tablet   3   . fluorometholone (FML) 0.1 % ophthalmic suspension   Both Eyes   Place 1 drop into both eyes as needed.          . Folic Acid-Vit H9-QQI W97 (FA-VITAMIN B-6-VITAMIN B-12) 2.2-25-0.5 MG TABS   Sublingual   Place under the tongue daily.           . furosemide (LASIX) 20 MG tablet   Oral   Take 1 tablet (20 mg total) by mouth 2 (two) times daily as needed.   180 tablet   3   . glipiZIDE (GLUCOTROL) 5 MG tablet   Oral   Take 5 mg by mouth daily before breakfast.         . glucose blood (ONE TOUCH ULTRA TEST) test strip      Use to test blood sugar twice daily dx: 250.00   100 each   11   . insulin NPH Human (HUMULIN N,NOVOLIN N) 100 UNIT/ML injection   Subcutaneous   Inject 20 Units into the skin at bedtime.          Marland Kitchen levothyroxine (SYNTHROID, LEVOTHROID) 50  MCG tablet   Oral   Take 50 mcg by mouth daily before breakfast.         . metFORMIN (GLUCOPHAGE-XR) 500 MG 24 hr tablet      Take two tablet by mouth in the morning and two tablets by mouth in the evening          . polyethylene glycol powder (MIRALAX) powder   Oral   Take 17 g by mouth as needed.          . rivaroxaban (XARELTO) 20 MG TABS tablet   Oral   Take 1 tablet (20 mg total) by mouth daily.   30 tablet   3     Allergies Alendronate sodium; Atorvastatin; Codeine; Ezetimibe-simvastatin; Lovastatin; Penicillins; and Rosiglitazone maleate  Family History  Problem Relation Age of Onset  . Family history unknown: Yes    Social History History  Substance Use Topics  . Smoking status: Never Smoker   . Smokeless tobacco: Never Used  . Alcohol Use: No    Review of Systems Constitutional: No fever/chills Eyes: No visual changes. ENT: No sore throat. Cardiovascular: Denies chest pain. Respiratory: Denies shortness of breath. Gastrointestinal: No abdominal pain.  No nausea, no vomiting.  No diarrhea.  No constipation. Genitourinary: Negative for dysuria. Musculoskeletal: Negative for back pain. Skin: Negative for rash. Neurological: Negative for headaches, focal weakness or numbness.  10-point ROS otherwise negative.  ____________________________________________   PHYSICAL EXAM:  VITAL SIGNS: ED Triage Vitals  Enc Vitals Group     BP 09/24/14 2057 144/69 mmHg     Pulse Rate 09/24/14 2057 80     Resp 09/24/14 2057 18     Temp 09/24/14 2057 98.1 F (36.7 C)     Temp Source 09/24/14 2057 Oral     SpO2 09/24/14 2057 100 %     Weight 09/24/14 2057 167 lb (75.751 kg)     Height 09/24/14 2057 5\' 4"  (1.626 m)     Head Cir --      Peak Flow --      Pain Score 09/24/14 2058 3     Pain Loc --      Pain Edu? --      Excl. in [place]? --     Constitutional: Alert and oriented. Well appearing and in no acute  distress. Eyes: Conjunctivae are normal. PERRL.  EOMI. Head: Atraumatic. Nose: No congestion/rhinnorhea. Mouth/Throat: Mucous membranes are moist.  Oropharynx non-erythematous. Neck: supple Cardiovascular: Normal rate, regular rhythm. Grossly normal heart sounds.  Good peripheral circulation. Respiratory: Normal respiratory effort.  No retractions. Lungs CTAB. Gastrointestinal: Soft and nontender. No distention. No abdominal bruits. No CVA tenderness. Musculoskeletal: tender over the left shoulder with inability to move the shoulder. Neurologic:  Normal speech and language. No gross focal neurologic deficits are appreciated. Speech is normal. Skin:  Skin is warm, dry and intact. No rash noted. Psychiatric: Mood and affect are normal. Speech and behavior are normal.  ____________________________________________   LABS (all labs ordered are listed, but only abnormal results are displayed)  Labs Reviewed - No data to display ____________________________________________  EKG   ____________________________________________  RADIOLOGY  EXAM: LEFT HUMERUS - 2+ VIEW  COMPARISON: None.  FINDINGS: There is a comminuted fracture involving the left humeral head and neck, with a likely large greater tuberosity fragment. There is shortening at the fracture site, with medial displacement of the distal humerus. The left humeral head remains seated at the glenoid fossa, though there may be a small underlying glenohumeral joint effusion.  The left acromioclavicular joint demonstrates mild inferior osteophyte formation. Soft tissue swelling is noted overlying the left shoulder.  IMPRESSION: Comminuted fracture involving the left humeral head and neck, with a likely large greater tuberosity fragment. Shortening noted at the fracture site, with medial displacement of the distal humerus. Suggestion of small underlying glenohumeral joint effusion.   Electronically Signed  By: Garald Balding M.D.  On: 09/24/2014  21:30  ____________________________________________   PROCEDURES  Procedure(s) performed: None  Critical Care performed: No  ____________________________________________   INITIAL IMPRESSION / ASSESSMENT AND PLAN / ED COURSE  Pertinent labs & imaging results that were available during my care of the patient were reviewed by me and considered in my medical decision making (see chart for details).  Left proximal humeral fracture, comminuted involving the head and neck. Contacted orthopedic on call, Dr. Rudene Christians, who recommended a sling and pain medicine. He will follow-up with her in the morning in the clinic. She declines pain medicine. She has tramadol at home. She is given a prescription for Zofran as needed for nausea. ____________________________________________   FINAL CLINICAL IMPRESSION(S) / ED DIAGNOSES  Final diagnoses:  Humerus fracture, left, closed, initial encounter      Mortimer Fries, PA-C 09/24/14 2230  Orbie Pyo, MD 09/25/14 [phone]

## 2014-09-24 NOTE — ED Notes (Signed)
Pt fell at home states normally has bad balance, injured left arm, denies any neck or back pain, no head injury.

## 2014-09-24 NOTE — Discharge Instructions (Signed)
Humerus Fracture, Treated with Immobilization The humerus is the large bone in the upper arm. A broken (fractured) humerus is often treated by wearing a cast, splint, or sling (immobilization). This holds the broken pieces in place so they can heal.  HOME CARE  Put ice on the injured area.  Put ice in a plastic bag.  Place a towel between your skin and the bag.  Leave the ice on for 15-20 minutes, 03-04 times a day.  If you are given a cast:  Do not scratch the skin under the cast.  Check the skin around the cast every day. You may put lotion on any red or sore areas.  Keep the cast dry and clean.  If you are given a splint:  Wear the splint as told.  Keep the splint clean and dry.  Loosen the elastic around the splint if your fingers become numb, cold, tingle, or turn blue.  If you are given a sling:  Wear the sling as told.  Do not put pressure on any part of the cast or splint until it is fully hardened.  The cast or splint must be protected with a plastic bag during bathing. Do not lower the cast or splint into water.  Only take medicine as told by your doctor.  Do exercises as told by your doctor.  Follow up as told by your doctor. GET HELP RIGHT AWAY IF:   Your skin or fingernails turn blue or gray.  Your arm feels cold or numb.  You have very bad pain in the injured arm.  You are having problems with the medicines you were given. MAKE SURE YOU:   Understand these instructions.  Will watch your condition.  Will get help right away if you are not doing well or get worse. Document Released: 09/27/2007 Document Revised: 07/03/2011 Document Reviewed: 05/25/2010 [facility] Patient Information 2015 [place], [place]. This information is not intended to replace advice given to you by your health care provider. Make sure you discuss any questions you have with your health care provider.    Humerus Fracture, Treated with Immobilization The humerus is the large  bone in your upper arm. You have a broken (fractured) humerus. These fractures are easily diagnosed with X-rays. TREATMENT  Simple fractures which will heal without disability are treated with simple immobilization. Immobilization means you will wear a cast, splint, or sling. You have a fracture which will do well with immobilization. The fracture will heal well simply by being held in a good position until it is stable enough to begin range of motion exercises. Do not take part in activities which would further injure your arm.  HOME CARE INSTRUCTIONS   Put ice on the injured area.  Put ice in a plastic bag.  Place a towel between your skin and the bag.  Leave the ice on for 15-20 minutes, 03-04 times a day.  If you have a cast:  Do not scratch the skin under the cast using sharp or pointed objects.  Check the skin around the cast every day. You may put lotion on any red or sore areas.  Keep your cast dry and clean.  If you have a splint:  Wear the splint as directed.  Keep your splint dry and clean.  You may loosen the elastic around the splint if your fingers become numb, tingle, or turn cold or blue.  If you have a sling:  Wear the sling as directed.  Do not put pressure on any  part of your cast or splint until it is fully hardened.  Your cast or splint can be protected during bathing with a plastic bag. Do not lower the cast or splint into water.  Only take over-the-counter or prescription medicines for pain, discomfort, or fever as directed by your caregiver.  Do range of motion exercises as instructed by your caregiver.  Follow up as directed by your caregiver. This is very important in order to avoid permanent injury or disability and chronic pain. SEEK IMMEDIATE MEDICAL CARE IF:   Your skin or nails in the injured arm turn blue or gray.  Your arm feels cold or numb.  You develop severe pain in the injured arm.  You are having problems with the medicines you  were given. MAKE SURE YOU:   Understand these instructions.  Will watch your condition.  Will get help right away if you are not doing well or get worse. Document Released: 07/17/2000 Document Revised: 07/03/2011 Document Reviewed: 05/25/2010 [facility] Patient Information 2015 [place], [place]. This information is not intended to replace advice given to you by your health care provider. Make sure you discuss any questions you have with your health care provider.   Use shoulder immobilizer for support and contact the counter clinic orthopedic Department tomorrow. Use tramadol as needed for pain.

## 2014-09-25 ENCOUNTER — Telehealth: Payer: Self-pay | Admitting: *Deleted

## 2014-09-25 NOTE — Telephone Encounter (Signed)
Pt fell and broke her arm, just calling to see how she's doing. Per pt she is doing well, just waiting to see the ortho dr. She will call if we can do anything for her.

## 2014-10-01 ENCOUNTER — Encounter
Admission: RE | Admit: 2014-10-01 | Discharge: 2014-10-01 | Disposition: A | Discharge status: Home / Self Care | Payer: Medicare Other | Source: Ambulatory Visit | Attending: Surgery | Admitting: Surgery

## 2014-10-01 DIAGNOSIS — K2950 Unspecified chronic gastritis without bleeding: Secondary | ICD-10-CM | POA: Insufficient documentation

## 2014-10-01 DIAGNOSIS — S4292XA Fracture of left shoulder girdle, part unspecified, initial encounter for closed fracture: Secondary | ICD-10-CM | POA: Diagnosis present

## 2014-10-01 DIAGNOSIS — Z880 Allergy status to penicillin: Secondary | ICD-10-CM | POA: Insufficient documentation

## 2014-10-01 DIAGNOSIS — Z01812 Encounter for preprocedural laboratory examination: Secondary | ICD-10-CM | POA: Diagnosis not present

## 2014-10-01 DIAGNOSIS — Z9889 Other specified postprocedural states: Secondary | ICD-10-CM | POA: Insufficient documentation

## 2014-10-01 DIAGNOSIS — D126 Benign neoplasm of colon, unspecified: Secondary | ICD-10-CM | POA: Diagnosis not present

## 2014-10-01 HISTORY — DX: Other abnormalities of gait and mobility: R26.89

## 2014-10-01 HISTORY — DX: Full incontinence of feces: R15.9

## 2014-10-01 LAB — BASIC METABOLIC PANEL
Anion gap: 10 (ref 5–15)
BUN: 25 mg/dL — ABNORMAL HIGH (ref 6–20)
CHLORIDE: 96 mmol/L — AB (ref 101–111)
CO2: 28 mmol/L (ref 22–32)
Calcium: 9.2 mg/dL (ref 8.9–10.3)
Creatinine, Ser: 0.77 mg/dL (ref 0.44–1.00)
GFR calc Af Amer: >60 mL/min (ref >60)
Glucose, Bld: 163 mg/dL — ABNORMAL HIGH (ref 65–99)
Potassium: 4.2 mmol/L (ref 3.5–5.1)
Sodium: 134 mmol/L — ABNORMAL LOW (ref 135–145)

## 2014-10-01 LAB — CBC
HEMATOCRIT: 31.8 % — AB (ref 35.0–47.0)
Hemoglobin: 10.7 g/dL — ABNORMAL LOW (ref 12.0–16.0)
MCH: 33.5 pg (ref 26.0–34.0)
MCHC: 33.7 g/dL (ref 32.0–36.0)
MCV: 99.2 fL (ref 80.0–100.0)
PLATELETS: 227 10*3/uL (ref 150–440)
RBC: 3.21 MIL/uL — AB (ref 3.80–5.20)
RDW: 13.9 % (ref 11.5–14.5)
WBC: 10.4 10*3/uL (ref 3.6–11.0)

## 2014-10-01 LAB — PROTIME-INR
INR: 1.7
Prothrombin Time: 20.2 seconds — ABNORMAL HIGH (ref 11.4–15.0)

## 2014-10-01 LAB — TYPE AND SCREEN
ABO/RH(D): A POS
Antibody Screen: NEGATIVE

## 2014-10-01 LAB — SURGICAL PCR SCREEN
MRSA, PCR: NEGATIVE
Staphylococcus aureus: NEGATIVE

## 2014-10-01 NOTE — Patient Instructions (Signed)
  Your procedure is scheduled on: 10/06/14 Tues Report to Day Surgery. To find out your arrival time please call [phone] between 1PM - 3PM on 10/07/14 Mon.  Remember: Instructions that are not followed completely may result in serious medical risk, up to and including death, or upon the discretion of your surgeon and anesthesiologist your surgery may need to be rescheduled.    __x__ 1. Do not eat food or drink liquids after midnight. No gum chewing or hard candies.     __x__ 2. No Alcohol for 24 hours before or after surgery.   ____ 3. Bring all medications with you on the day of surgery if instructed.    __x__ 4. Notify your doctor if there is any change in your medical condition     (cold, fever, infections).     Do not wear jewelry, make-up, hairpins, clips or nail polish.  Do not wear lotions, powders, or perfumes. You may wear deodorant.  Do not shave 48 hours prior to surgery. Men may shave face and neck.  Do not bring valuables to the hospital.    [facility] is not responsible for any belongings or valuables.               Contacts, dentures or bridgework may not be worn into surgery.  Leave your suitcase in the car. After surgery it may be brought to your room.  For patients admitted to the hospital, discharge time is determined by your                treatment team.   Patients discharged the day of surgery will not be allowed to drive home.   Please read over the following fact sheets that you were given:   MRSA Information   __x__ Take these medicines the morning of surgery with A SIP OF WATER:    1. digoxin  2. Synthroid  3.   4.  5.  6.  ____ Fleet Enema (as directed)   _x__ Use CHG Soap as directed  ____ Use inhalers on the day of surgery  _x__ Stop metformin 2 days prior to surgery    __x__ Take 1/2 of usual insulin dose the night before surgery and none on the morning of surgery.   _x___ Stop Coumadin/Plavix/aspirin on stopr xarelto per  instruction given by [facility] ____ Stop Anti-inflammatories on     ____ Stop supplements until after surgery.    ____ Bring C-Pap to the hospital.

## 2014-10-02 ENCOUNTER — Telehealth: Payer: Self-pay

## 2014-10-02 LAB — ABO/RH: ABO/RH(D): A POS

## 2014-10-02 NOTE — Telephone Encounter (Signed)
Patient returning call.  Stated to patient that per Ignacia Bayley, NP, do not take xarelto three days prior to surgery and surgeon will indicate when to resume.  Patient verbalized understanding Will fax instructions to [facility]

## 2014-10-02 NOTE — Telephone Encounter (Signed)
Left message on Tim/Linda Lungren's home number and Linda Moncrief's cell regarding holding xarelto for upcoming surgery. Patient's home number has no voice mail set up. Unable to leave message there.

## 2014-10-05 NOTE — OR Nursing (Signed)
Pt/inr with inr 1.70. DR [facility] notified and repeat for am surgery

## 2014-10-06 ENCOUNTER — Inpatient Hospital Stay: Payer: Medicare Other

## 2014-10-06 ENCOUNTER — Ambulatory Visit: Payer: Medicare Other | Admitting: Anesthesiology

## 2014-10-06 ENCOUNTER — Inpatient Hospital Stay
Admission: RE | Admit: 2014-10-06 | Discharge: 2014-10-08 | DRG: 483 | Disposition: A | Discharge status: Skilled Nursing Facility | Payer: Medicare Other | Source: Ambulatory Visit | Attending: Surgery | Admitting: Surgery

## 2014-10-06 ENCOUNTER — Encounter: Payer: Self-pay | Admitting: Anesthesiology

## 2014-10-06 ENCOUNTER — Encounter
Admission: RE | Disposition: A | Discharge status: Skilled Nursing Facility | Payer: Self-pay | Source: Ambulatory Visit | Attending: Surgery

## 2014-10-06 DIAGNOSIS — Z96612 Presence of left artificial shoulder joint: Secondary | ICD-10-CM

## 2014-10-06 DIAGNOSIS — I10 Essential (primary) hypertension: Secondary | ICD-10-CM | POA: Diagnosis present

## 2014-10-06 DIAGNOSIS — W1830XA Fall on same level, unspecified, initial encounter: Secondary | ICD-10-CM | POA: Diagnosis present

## 2014-10-06 DIAGNOSIS — Z794 Long term (current) use of insulin: Secondary | ICD-10-CM

## 2014-10-06 DIAGNOSIS — E119 Type 2 diabetes mellitus without complications: Secondary | ICD-10-CM | POA: Diagnosis present

## 2014-10-06 DIAGNOSIS — S42202A Unspecified fracture of upper end of left humerus, initial encounter for closed fracture: Principal | ICD-10-CM | POA: Diagnosis present

## 2014-10-06 DIAGNOSIS — M810 Age-related osteoporosis without current pathological fracture: Secondary | ICD-10-CM | POA: Diagnosis present

## 2014-10-06 DIAGNOSIS — E039 Hypothyroidism, unspecified: Secondary | ICD-10-CM | POA: Diagnosis present

## 2014-10-06 DIAGNOSIS — Z8543 Personal history of malignant neoplasm of ovary: Secondary | ICD-10-CM | POA: Diagnosis not present

## 2014-10-06 DIAGNOSIS — S42293A Other displaced fracture of upper end of unspecified humerus, initial encounter for closed fracture: Secondary | ICD-10-CM | POA: Diagnosis present

## 2014-10-06 HISTORY — PX: REVERSE SHOULDER ARTHROPLASTY: SHX5054

## 2014-10-06 LAB — GLUCOSE, CAPILLARY
GLUCOSE-CAPILLARY: 197 mg/dL — AB (ref 65–99)
Glucose-Capillary: 166 mg/dL — ABNORMAL HIGH (ref 65–99)
Glucose-Capillary: 197 mg/dL — ABNORMAL HIGH (ref 65–99)
Glucose-Capillary: 280 mg/dL — ABNORMAL HIGH (ref 65–99)

## 2014-10-06 LAB — PROTIME-INR
INR: 1.17
Prothrombin Time: 15.1 seconds — ABNORMAL HIGH (ref 11.4–15.0)

## 2014-10-06 SURGERY — ARTHROPLASTY, SHOULDER, TOTAL, REVERSE
Anesthesia: General | Laterality: Left

## 2014-10-06 MED ORDER — FAMOTIDINE 20 MG PO TABS
20.0000 mg | ORAL_TABLET | Freq: Once | ORAL | Status: AC
  Administered 2014-10-06: 20 mg via ORAL

## 2014-10-06 MED ORDER — SENNOSIDES-DOCUSATE SODIUM 8.6-50 MG PO TABS
1.0000 | ORAL_TABLET | Freq: Every evening | ORAL | Status: DC | PRN
Start: 2014-10-06 — End: 2014-10-08

## 2014-10-06 MED ORDER — LACTATED RINGERS IV SOLN
INTRAVENOUS | Status: DC
  Administered 2014-10-06 (×2): via INTRAVENOUS

## 2014-10-06 MED ORDER — VITAMIN D 1000 UNITS PO TABS
2000.0000 [IU] | ORAL_TABLET | Freq: Every day | ORAL | Status: DC
  Administered 2014-10-07 – 2014-10-08 (×2): 2000 [IU] via ORAL
  Filled 2014-10-06 (×4): qty 2

## 2014-10-06 MED ORDER — BISOPROLOL-HYDROCHLOROTHIAZIDE 2.5-6.25 MG PO TABS
1.0000 | ORAL_TABLET | Freq: Every day | ORAL | Status: DC
  Administered 2014-10-07 – 2014-10-08 (×2): 1 via ORAL
  Filled 2014-10-06 (×5): qty 1

## 2014-10-06 MED ORDER — MIDAZOLAM HCL 5 MG/5ML IJ SOLN
1.0000 mg | Freq: Once | INTRAMUSCULAR | Status: AC
  Administered 2014-10-06: 1 mg via INTRAVENOUS

## 2014-10-06 MED ORDER — FENTANYL CITRATE (PF) 100 MCG/2ML IJ SOLN: 25.0000 ug | INTRAMUSCULAR | Status: DC | PRN

## 2014-10-06 MED ORDER — ADULT MULTIVITAMIN W/MINERALS CH
ORAL_TABLET | Freq: Every day | ORAL | Status: DC
  Administered 2014-10-07 – 2014-10-08 (×2): 1 via ORAL
  Filled 2014-10-06 (×2): qty 1

## 2014-10-06 MED ORDER — FLEET ENEMA 7-19 GM/118ML RE ENEM: 1.0000 | ENEMA | Freq: Once | RECTAL | Status: AC | PRN

## 2014-10-06 MED ORDER — ONDANSETRON HCL 4 MG/2ML IJ SOLN
INTRAMUSCULAR | Status: DC | PRN
  Administered 2014-10-06: 4 mg via INTRAVENOUS

## 2014-10-06 MED ORDER — PHENYLEPHRINE HCL 10 MG/ML IJ SOLN
10.0000 mg | INTRAVENOUS | Status: DC | PRN
  Administered 2014-10-06: 25 ug/min via INTRAVENOUS

## 2014-10-06 MED ORDER — ONDANSETRON HCL 4 MG PO TABS: 4.0000 mg | ORAL_TABLET | Freq: Four times a day (QID) | ORAL | Status: DC | PRN

## 2014-10-06 MED ORDER — FLUOROMETHOLONE 0.1 % OP SUSP: 1.0000 [drp] | OPHTHALMIC | Status: DC | PRN

## 2014-10-06 MED ORDER — FENTANYL CITRATE (PF) 100 MCG/2ML IJ SOLN
INTRAMUSCULAR | Status: DC | PRN
  Administered 2014-10-06: 150 ug via INTRAVENOUS
  Administered 2014-10-06: 100 ug via INTRAVENOUS

## 2014-10-06 MED ORDER — PHENYLEPHRINE HCL 10 MG/ML IJ SOLN
INTRAMUSCULAR | Status: AC
  Filled 2014-10-06: qty 1

## 2014-10-06 MED ORDER — PHENYLEPHRINE HCL 10 MG/ML IJ SOLN
INTRAMUSCULAR | Status: DC | PRN
  Administered 2014-10-06 (×3): 100 ug via INTRAVENOUS
  Administered 2014-10-06 (×4): 150 ug via INTRAVENOUS

## 2014-10-06 MED ORDER — PANTOPRAZOLE SODIUM 40 MG PO TBEC
40.0000 mg | DELAYED_RELEASE_TABLET | Freq: Every day | ORAL | Status: DC
  Administered 2014-10-07 – 2014-10-08 (×2): 40 mg via ORAL
  Filled 2014-10-06 (×2): qty 1

## 2014-10-06 MED ORDER — ACETAMINOPHEN 650 MG RE SUPP: 650.0000 mg | Freq: Four times a day (QID) | RECTAL | Status: DC | PRN

## 2014-10-06 MED ORDER — ACETAMINOPHEN 10 MG/ML IV SOLN
INTRAVENOUS | Status: AC
  Filled 2014-10-06: qty 100

## 2014-10-06 MED ORDER — VANCOMYCIN HCL IN DEXTROSE 1-5 GM/200ML-% IV SOLN: 1000.0000 mg | Freq: Once | INTRAVENOUS | Status: DC

## 2014-10-06 MED ORDER — FENTANYL BOLUS VIA INFUSION
50.0000 ug | Freq: Once | INTRAVENOUS | Status: AC
  Administered 2014-10-06: 50 ug via INTRAVENOUS

## 2014-10-06 MED ORDER — GLIPIZIDE 5 MG PO TABS
5.0000 mg | ORAL_TABLET | Freq: Every day | ORAL | Status: DC
  Administered 2014-10-08: 5 mg via ORAL
  Filled 2014-10-06 (×2): qty 1

## 2014-10-06 MED ORDER — LEVOTHYROXINE SODIUM 50 MCG PO TABS
50.0000 ug | ORAL_TABLET | Freq: Every day | ORAL | Status: DC
Start: 2014-10-07 — End: 2014-10-08
  Administered 2014-10-07 – 2014-10-08 (×2): 50 ug via ORAL
  Filled 2014-10-06 (×4): qty 1

## 2014-10-06 MED ORDER — SODIUM CHLORIDE 0.9 % IV SOLN
INTRAVENOUS | Status: DC | PRN
  Administered 2014-10-06: .2 mL via TOPICAL

## 2014-10-06 MED ORDER — TRANEXAMIC ACID 1000 MG/10ML IV SOLN
INTRAVENOUS | Status: DC | PRN
  Administered 2014-10-06: 10 mg via INTRAVENOUS

## 2014-10-06 MED ORDER — MIDAZOLAM HCL 5 MG/5ML IJ SOLN
INTRAMUSCULAR | Status: AC
  Administered 2014-10-06: 1 mg via INTRAVENOUS
  Filled 2014-10-06: qty 5

## 2014-10-06 MED ORDER — LIDOCAINE HCL (CARDIAC) 20 MG/ML IV SOLN
INTRAVENOUS | Status: DC | PRN
  Administered 2014-10-06: 100 mg via INTRAVENOUS

## 2014-10-06 MED ORDER — ACETAMINOPHEN 10 MG/ML IV SOLN
INTRAVENOUS | Status: DC | PRN
  Administered 2014-10-06: 1000 mg via INTRAVENOUS

## 2014-10-06 MED ORDER — FUROSEMIDE 20 MG PO TABS
20.0000 mg | ORAL_TABLET | Freq: Every day | ORAL | Status: DC
  Administered 2014-10-07 – 2014-10-08 (×2): 20 mg via ORAL
  Filled 2014-10-06 (×2): qty 1

## 2014-10-06 MED ORDER — ONDANSETRON HCL 4 MG/2ML IJ SOLN: 4.0000 mg | Freq: Once | INTRAMUSCULAR | Status: DC | PRN

## 2014-10-06 MED ORDER — NEOMYCIN-POLYMYXIN B GU 40-200000 IR SOLN
Status: AC
  Filled 2014-10-06: qty 20

## 2014-10-06 MED ORDER — OXYCODONE HCL 5 MG PO TABS
5.0000 mg | ORAL_TABLET | ORAL | Status: DC | PRN
Start: 2014-10-06 — End: 2014-10-08
  Administered 2014-10-07: 5 mg via ORAL
  Administered 2014-10-07 – 2014-10-08 (×3): 10 mg via ORAL
  Filled 2014-10-06: qty 1
  Filled 2014-10-06: qty 2
  Filled 2014-10-06: qty 1
  Filled 2014-10-06: qty 2
  Filled 2014-10-06: qty 1

## 2014-10-06 MED ORDER — RIVAROXABAN 20 MG PO TABS
20.0000 mg | ORAL_TABLET | Freq: Every day | ORAL | Status: DC
  Administered 2014-10-07: 20 mg via ORAL
  Filled 2014-10-06 (×2): qty 1

## 2014-10-06 MED ORDER — BISACODYL 10 MG RE SUPP: 10.0000 mg | Freq: Every day | RECTAL | Status: DC | PRN

## 2014-10-06 MED ORDER — VITAMIN C 500 MG PO TABS
1000.0000 mg | ORAL_TABLET | Freq: Every day | ORAL | Status: DC
  Administered 2014-10-07 – 2014-10-08 (×2): 1000 mg via ORAL
  Filled 2014-10-06 (×2): qty 2

## 2014-10-06 MED ORDER — METFORMIN HCL ER 500 MG PO TB24
1000.0000 mg | ORAL_TABLET | Freq: Two times a day (BID) | ORAL | Status: DC
  Administered 2014-10-06 – 2014-10-08 (×3): 1000 mg via ORAL
  Filled 2014-10-06 (×4): qty 2

## 2014-10-06 MED ORDER — CALCIUM & MAGNESIUM CARBONATES 311-232 MG PO TABS: 2.0000 | ORAL_TABLET | Freq: Every day | ORAL | Status: DC

## 2014-10-06 MED ORDER — RISAQUAD PO CAPS
ORAL_CAPSULE | Freq: Every day | ORAL | Status: DC
  Administered 2014-10-08: 1 via ORAL
  Filled 2014-10-06 (×3): qty 1

## 2014-10-06 MED ORDER — PROPOFOL 10 MG/ML IV BOLUS
INTRAVENOUS | Status: DC | PRN
  Administered 2014-10-06: 80 mg via INTRAVENOUS

## 2014-10-06 MED ORDER — ONDANSETRON HCL 4 MG/2ML IJ SOLN
4.0000 mg | Freq: Four times a day (QID) | INTRAMUSCULAR | Status: DC | PRN
  Administered 2014-10-07: 4 mg via INTRAVENOUS
  Filled 2014-10-06: qty 2

## 2014-10-06 MED ORDER — METOCLOPRAMIDE HCL 10 MG PO TABS: 5.0000 mg | ORAL_TABLET | Freq: Three times a day (TID) | ORAL | Status: DC | PRN

## 2014-10-06 MED ORDER — FAMOTIDINE 20 MG PO TABS
ORAL_TABLET | ORAL | Status: AC
Start: 2014-10-06 — End: 2014-10-06
  Filled 2014-10-06: qty 1

## 2014-10-06 MED ORDER — FENTANYL CITRATE (PF) 100 MCG/2ML IJ SOLN
INTRAMUSCULAR | Status: AC
  Filled 2014-10-06: qty 2

## 2014-10-06 MED ORDER — BUPIVACAINE-EPINEPHRINE (PF) 0.5% -1:200000 IJ SOLN
INTRAMUSCULAR | Status: DC | PRN
  Administered 2014-10-06: 30 mL via PERINEURAL

## 2014-10-06 MED ORDER — SODIUM CHLORIDE 0.9 % IJ SOLN
INTRAMUSCULAR | Status: AC
  Filled 2014-10-06: qty 50

## 2014-10-06 MED ORDER — VANCOMYCIN HCL IN DEXTROSE 1-5 GM/200ML-% IV SOLN
1000.0000 mg | Freq: Two times a day (BID) | INTRAVENOUS | Status: AC
  Filled 2014-10-06: qty 200

## 2014-10-06 MED ORDER — METOCLOPRAMIDE HCL 5 MG/ML IJ SOLN: 5.0000 mg | Freq: Three times a day (TID) | INTRAMUSCULAR | Status: DC | PRN

## 2014-10-06 MED ORDER — HYDROMORPHONE HCL 1 MG/ML IJ SOLN: 0.5000 mg | INTRAMUSCULAR | Status: DC | PRN

## 2014-10-06 MED ORDER — BUPIVACAINE LIPOSOME 1.3 % IJ SUSP
INTRAMUSCULAR | Status: AC
  Filled 2014-10-06: qty 20

## 2014-10-06 MED ORDER — BUPIVACAINE-EPINEPHRINE (PF) 0.5% -1:200000 IJ SOLN
INTRAMUSCULAR | Status: AC
  Filled 2014-10-06: qty 30

## 2014-10-06 MED ORDER — INSULIN DETEMIR 100 UNIT/ML ~~LOC~~ SOLN
20.0000 [IU] | Freq: Every day | SUBCUTANEOUS | Status: DC
  Administered 2014-10-06 – 2014-10-07 (×2): 20 [IU] via SUBCUTANEOUS
  Filled 2014-10-06 (×3): qty 0.2

## 2014-10-06 MED ORDER — DIGOXIN 125 MCG PO TABS
125.0000 ug | ORAL_TABLET | Freq: Every day | ORAL | Status: DC
  Administered 2014-10-07 – 2014-10-08 (×2): 125 ug via ORAL
  Filled 2014-10-06 (×2): qty 1

## 2014-10-06 MED ORDER — NEOMYCIN-POLYMYXIN B GU 40-200000 IR SOLN
Status: DC | PRN
  Administered 2014-10-06: 16 mL

## 2014-10-06 MED ORDER — TRANEXAMIC ACID 1000 MG/10ML IV SOLN
INTRAVENOUS | Status: AC
  Filled 2014-10-06: qty 10

## 2014-10-06 MED ORDER — POTASSIUM CHLORIDE IN NACL 20-0.9 MEQ/L-% IV SOLN: INTRAVENOUS | Status: DC

## 2014-10-06 MED ORDER — VANCOMYCIN HCL IN DEXTROSE 1-5 GM/200ML-% IV SOLN
INTRAVENOUS | Status: AC
  Administered 2014-10-06: 1000 mg via INTRAVENOUS
  Filled 2014-10-06: qty 200

## 2014-10-06 MED ORDER — ACETAMINOPHEN 325 MG PO TABS: 650.0000 mg | ORAL_TABLET | Freq: Four times a day (QID) | ORAL | Status: DC | PRN

## 2014-10-06 MED ORDER — SODIUM CHLORIDE 0.9 % IV SOLN
INTRAVENOUS | Status: DC | PRN
  Administered 2014-10-06: 60 mL

## 2014-10-06 SURGICAL SUPPLY — 74 items
BAG DECANTER STRL (MISCELLANEOUS) ×2 IMPLANT
BIT DRILL 2 MINI QC DISP (BIT) ×1 IMPLANT
BIT DRILL TWIST 2.7 (BIT) IMPLANT
BLADE SAGITTAL WIDE XTHICK NO (BLADE) ×2 IMPLANT
BNDG COHESIVE 6X5 TAN STRL LF (GAUZE/BANDAGES/DRESSINGS) ×1 IMPLANT
BONE CEMENT PALACOSE (Orthopedic Implant) ×2 IMPLANT
BOWL CEMENT MIX W/ADAPTER (MISCELLANEOUS) ×2 IMPLANT
CAPT SHLDR REVTOTAL 2 ×1 IMPLANT
CATH TRAY 16F METER LATEX (MISCELLANEOUS) ×2 IMPLANT
CEMENT BONE PALACOSE (Orthopedic Implant) ×2 IMPLANT
CHLORAPREP W/TINT 26ML (MISCELLANEOUS) ×4 IMPLANT
Comprehensive Reverse Shoulder -Humeral bearing ×2 IMPLANT
Comprehensive shoulder system-Humeral fracture pos ×2 IMPLANT
DECANTER SPIKE VIAL GLASS SM (MISCELLANEOUS) ×6 IMPLANT
DRAPE FLUOR MINI C-ARM 54X84 (DRAPES) IMPLANT
DRAPE IMP U-DRAPE 54X76 (DRAPES) ×4 IMPLANT
DRAPE INCISE IOBAN 66X45 STRL (DRAPES) ×4 IMPLANT
DRAPE INCISE IOBAN 66X60 STRL (DRAPES) ×2 IMPLANT
DRAPE POUCH INSTRU U-SHP 10X18 (DRAPES) ×2 IMPLANT
DRAPE SHEET LG 3/4 BI-LAMINATE (DRAPES) ×4 IMPLANT
DRAPE TABLE BACK 80X90 (DRAPES) ×2 IMPLANT
DRSG OPSITE POSTOP 4X8 (GAUZE/BANDAGES/DRESSINGS) ×2 IMPLANT
ELECT CAUTERY BLADE 6.4 (BLADE) ×2 IMPLANT
GAUZE PACK 2X3YD (MISCELLANEOUS) ×2 IMPLANT
GLOVE BIO SURGEON STRL SZ8 (GLOVE) ×4 IMPLANT
GLOVE INDICATOR 8.0 STRL GRN (GLOVE) ×3 IMPLANT
GOWN STRL REUS W/ TWL LRG LVL3 (GOWN DISPOSABLE) ×2 IMPLANT
GOWN STRL REUS W/TWL LRG LVL3 (GOWN DISPOSABLE) ×4
HANDPIECE SUCTION TUBG SURGILV (MISCELLANEOUS) ×2 IMPLANT
HOOD PEEL AWAY FACE SHEILD DIS (HOOD) ×8 IMPLANT
IV NS 100ML SINGLE PACK (IV SOLUTION) ×4 IMPLANT
KIT STABILIZATION SHOULDER (MISCELLANEOUS) ×2 IMPLANT
MASK FACE SPIDER DISP (MASK) ×2 IMPLANT
NDL MAYO CATGUT SZ1 (NEEDLE) ×1 IMPLANT
NDL MAYO CATGUT SZ5 (NEEDLE)
NDL SAFETY 22GX1.5 (NEEDLE) ×2 IMPLANT
NDL SAFETY 25GX1.5 (NEEDLE) ×2 IMPLANT
NDL SPNL 20GX3.5 QUINCKE YW (NEEDLE) ×1 IMPLANT
NDL SUT 5 .5 CRC TPR PNT MAYO (NEEDLE) ×1 IMPLANT
NEEDLE 18GX1X1/2 (RX/OR ONLY) (NEEDLE) ×2 IMPLANT
NEEDLE MAYO 6 CRC TAPER PT (NEEDLE) IMPLANT
NEEDLE MAYO CATGUT SZ1 (NEEDLE) IMPLANT
NEEDLE MAYO CATGUT SZ4 (NEEDLE) IMPLANT
NEEDLE SPNL 20GX3.5 QUINCKE YW (NEEDLE) ×2 IMPLANT
NS IRRIG 1000ML POUR BTL (IV SOLUTION) ×2 IMPLANT
PACK ARTHROSCOPY SHOULDER (MISCELLANEOUS) ×2 IMPLANT
PIN THREADED REVERSE (PIN) IMPLANT
RESTRICTOR CEMENT PE SZ 2 (Cement) ×2 IMPLANT
SLING ULTRA II M (MISCELLANEOUS) ×2 IMPLANT
SOL .9 NS 3000ML IRR  AL (IV SOLUTION) ×1
SOL .9 NS 3000ML IRR AL (IV SOLUTION) ×1
SOL .9 NS 3000ML IRR UROMATIC (IV SOLUTION) ×1 IMPLANT
SPONGE LAP 18X18 5 PK (GAUZE/BANDAGES/DRESSINGS) ×2 IMPLANT
STAPLER SKIN PROX 35W (STAPLE) ×2 IMPLANT
STRAP SAFETY BODY (MISCELLANEOUS) ×4 IMPLANT
SUCTION FRAZIER TIP 10 FR DISP (SUCTIONS) ×2 IMPLANT
SUT ETHIBOND #5 BRAIDED 30INL (SUTURE) ×4 IMPLANT
SUT ETHIBOND 0 MO6 C/R (SUTURE) ×2 IMPLANT
SUT ETHIBOND NAB CT1 #1 30IN (SUTURE) ×1 IMPLANT
SUT FIBERWIRE #2 38 BLUE 1/2 (SUTURE)
SUT FIBERWIRE #2 38 T-5 BLUE (SUTURE) ×14
SUT STEEL 5 (SUTURE) IMPLANT
SUT TICRON 2-0 30IN 311381 (SUTURE) ×5 IMPLANT
SUT VIC AB 0 CT1 36 (SUTURE) ×2 IMPLANT
SUT VIC AB 2-0 CT1 27 (SUTURE) ×6
SUT VIC AB 2-0 CT1 TAPERPNT 27 (SUTURE) ×3 IMPLANT
SUTURE FIBERWR #2 38 BLUE 1/2 (SUTURE) ×1 IMPLANT
SUTURE FIBERWR #2 38 T-5 BLUE (SUTURE) IMPLANT
SYR 30ML LL (SYRINGE) ×2 IMPLANT
SYR TB 1ML LUER SLIP (SYRINGE) ×2 IMPLANT
SYRINGE 10CC LL (SYRINGE) IMPLANT
TAPE TRANSPORE STRL 2 31045 (GAUZE/BANDAGES/DRESSINGS) ×2 IMPLANT
TUBE CONNECTING 6X3/16 (MISCELLANEOUS) ×1 IMPLANT
WATER STERILE IRR 1000ML POUR (IV SOLUTION) ×1 IMPLANT

## 2014-10-06 NOTE — Op Note (Signed)
10/06/2014  1:52 PM  Patient:   Jodi Reeves  Pre-Op Diagnosis:   Displaced 4-part left proximal humerus fracture.  Post-Op Diagnosis:   Same.  Procedure:   Reverse left total shoulder arthroplasty.  Surgeon:   Pascal Lux, MD  Assistant:   Cameron Proud, PA-C  Anesthesia:   General endotracheal with an interscalene block placed preoperatively by the anesthesiologist.  Findings:   As above.  Complications:   None  EBL:   250 cc  Fluids:   1200 cc crystalloid  UOP:   600 cc  TT:   None  Drains:   None  Closure:   Staples  Implants:   Biomet Comprehensive system with a #8 cemented humeral fracture stem, a 10 mm metaphyseal sleeve, a #2 distal cement restrictor, a 44 mm humeral tray with a +3 mm insert, and a mini-base plate with a 36 mm glenosphere.  Brief Clinical Note:   The patient is an 81 year old female who sustained the above-noted injury nearly 2 weeks ago when she apparently lost her balance and fell while getting up from the couch. She presented to the emergency room where x-rays demonstrated the above-noted findings. Initially, the fracture alignment did not appear too bad and some thought was given to treat the injury nonsurgically. However, upon her follow-up visit last week, repeat x-rays demonstrated an unacceptable displacement of the proximal humerus fracture components. She presents at this time for a left shoulder hemiarthroplasty versus a reverse left total shoulder arthroplasty.  Procedure:   The patient underwent placement of an interscalene block in the preoperative holding area by anesthesia before she was brought into the operating room and lain in the supine position. The patient underwent general endotracheal intubation and anesthesia. She was repositioned in the beach chair position using the beach chair positioner. The left shoulder and upper extremity were prepped with ChloraPrep solution before being draped sterilely. Preoperative antibiotics  were administered. A standard anterior approach the shoulder was made through an approximately 4-5 inch incision. The incision was carried down through the subcutaneous tissues to expose the deltopectoral fascia. The interval between the deltoid and pectoralis muscles was identified and this plane developed, retracting the cephalic vein laterally with the deltoid muscle. The conjoined tendon was identified. The lateral margin was dissected and the Kolbel self-retraining retractor inserted. The "three sisters" were identified and cauterized. Bursal tissues were removed to improve visualization. The biceps tendon was identified and followed up through the bicipital groove. It was released along its entirety and the rotator interval released as well between the subscapularis and super spinatus tendons back to the glenoid. The lesser tuberosity was dissected free along with its subscapularis attachment and retracted medially after placing three tagging sutures. The humeral head was carefully dissected free and removed in its entirety. The proximal humerus was carefully inspected. There was a metaphyseal fragment laterally that was carefully reduced and secured using a 20-gauge cerclage wire. This was tightened securely and the wire tip bent over.  Attention was directed to the glenoid. The labrum was debrided circumferentially before the center of the glenoid was marked with electrocautery. The guidewire was drilled into the glenoid neck using the appropriate guide. After verifying its position, it was overreamed with the mini-baseplate reamer to create a flat surface. The permanent mini-baseplate was impacted into place. It was stabilized with a 20 mm central screw and four peripheral screws. Locking screws were placed superiorly and inferiorly while nonlocking screws were placed anteriorly and posteriorly. The permanent 36  mm glenosphere was then impacted into place and its Morse taper locking mechanism verified  using manual distraction.  Attention was redirected to the humeral side. The humeral canal was reamed sequentially beginning with the end-cutting reamer then progressing from a 4 mm reamer up to a 10 mm reamer. This provided excellent circumferential chatter. The internal diameter the canal was measured and found to be optimally fitted with a #2 cement restrictor trial. Therefore the permanent 2 mm cement restrictor was placed distally after verifying that it would be distal to the stem. The tap was then used to position the humeral stem sleeve before the #10 sleeve was placed the appropriate depth. The canal was prepared for cementing by irrigating and thoroughly with bacitracin saline solution via the jet lavage system before packing it with a Neo-Synephrine soaked vaginal sponge. Meanwhile, so it was mixed on the back table. When the cement was ready, the humeral canal with was filled with cement before the permanent #8 humeral stem was impacted into position with care taken to maintain the appropriate retroversion of approximately 30. Once the cement hardened, a trial reduction was performed using both a +0 and +3 mm proximal humeral forms. The +3 mm humeral platform demonstrated excellent tension and stability. The hand could be brought across the chest to the opposite shoulder and brought to the top of the patient's head and to the patient's ear. The shoulder appeared stable throughout this range of motion. The joint was dislocated and the trial components removed. The permanent 44 mm humeral platform with the +3 mm insert was put together on the back table and impacted into place. Again, the [facility] taper locking mechanism was verified using manual distraction. The shoulder was relocated using two finger pressure and again placed through a range of motion with the findings as described above.  The wound was copiously irrigated with bacitracin saline solution using the jet lavage system before a total of 20  cc of Exparel diluted out to 60 cc with normal saline and 30 cc of 0.5% Sensorcaine with epinephrine was injected into the pericapsular and peri-incisional tissues to help with postoperative analgesia. The greater and lesser tuberosities were reapproximated using the Biomet tuberosity suture repair protocol. A total of 5 #2 fiber wires were used. 2 of the fiber wires were placed through drill holes in the metaphyseal cortex prior to cementing. Numerous additional #0 Ethibond interrupted sutures were used to reinforce this layer closure as well as to close the distal portion of the rotator interval the biceps tendon stump was sutured to the adjacent soft tissues to create a soft tissue tenodesis. The deltopectoral interval was closed using 2-0 Vicryl interrupted sutures before the subcutaneous tissues also were closed using 2-0 Vicryl interrupted sutures. The skin was closed using staples. Prior to closing the skin, 1 g of transexemic acid in 10 cc of normal saline was injected intra-articularly to help with postoperative bleeding. A sterile occlusive dressing was applied to the wound before the arm was placed into a shoulder immobilizer with an abduction pillow. The patient was then transferred back to a hospital bed before being awakened, extubated, and returned to the recovery room in satisfactory condition after tolerating the procedure well.

## 2014-10-06 NOTE — Transfer of Care (Signed)
Immediate Anesthesia Transfer of Care Note  Patient: Jodi Reeves  Procedure(s) Performed: Procedure(s): REVERSE SHOULDER ARTHROPLASTY (Left)  Patient Location: PACU  Anesthesia Type:General  Level of Consciousness: awake and patient cooperative  Airway & Oxygen Therapy: Patient Spontanous Breathing and Patient connected to nasal cannula oxygen  Post-op Assessment: Report given to RN and Post -op Vital signs reviewed and stable  Post vital signs: Reviewed and stable  Last Vitals:  Filed Vitals:   10/06/14 1402  BP: 155/69  Pulse:   Temp: 36.5 C  Resp: 18    Complications: No apparent anesthesia complications

## 2014-10-06 NOTE — Anesthesia Preprocedure Evaluation (Signed)
Anesthesia Evaluation  Patient identified by MRN, date of birth, ID band Patient awake    Reviewed: Allergy & Precautions, NPO status , Patient's Chart, lab work & pertinent test results, reviewed documented beta blocker date and time   Airway Mallampati: II  TM Distance: >3 FB     Dental  (+) Chipped   Pulmonary shortness of breath,          Cardiovascular hypertension, +CHF     Neuro/Psych Anxiety    GI/Hepatic GERD-  ,  Endo/Other  diabetes, Type 2Hypothyroidism   Renal/GU      Musculoskeletal   Abdominal   Peds  Hematology   Anesthesia Other Findings   Reproductive/Obstetrics                             Anesthesia Physical Anesthesia Plan  ASA: III  Anesthesia Plan: General   Post-op Pain Management:    Induction: Intravenous  Airway Management Planned: Oral ETT  Additional Equipment:   Intra-op Plan:   Post-operative Plan:   Informed Consent: I have reviewed the patients History and Physical, chart, labs and discussed the procedure including the risks, benefits and alternatives for the proposed anesthesia with the patient or authorized representative who has indicated his/her understanding and acceptance.     Plan Discussed with: CRNA  Anesthesia Plan Comments: (Hx of dental abscess - ok now status post antibiotics.  Off of xarelto for 3 days.  OK for ISNB.)        Anesthesia Quick Evaluation

## 2014-10-06 NOTE — Anesthesia Postprocedure Evaluation (Signed)
  Anesthesia Post-op Note  Patient: Jodi Reeves  Procedure(s) Performed: Procedure(s): REVERSE SHOULDER ARTHROPLASTY (Left)  Anesthesia type:General  Patient location: PACU  Post pain: Pain level controlled  Post assessment: Post-op Vital signs reviewed, Patient's Cardiovascular Status Stable, Respiratory Function Stable, Patent Airway and No signs of Nausea or vomiting  Post vital signs: Reviewed and stable  Last Vitals:  Filed Vitals:   10/06/14 1432  BP:   Pulse:   Temp: 36.9 C  Resp:     Level of consciousness: awake, alert  and patient cooperative  Complications: No apparent anesthesia complications

## 2014-10-06 NOTE — Anesthesia Procedure Notes (Signed)
Anesthesia Regional Block:  Interscalene brachial plexus block  Pre-Anesthetic Checklist: ,, timeout performed, Correct Patient, Correct Site, Correct Laterality, Correct Procedure, Correct Position, site marked, Risks and benefits discussed,  Surgical consent,  Pre-op evaluation,  At surgeon's request and post-op pain management   Prep: Betadine       Needles:  Injection technique: Single-shot  Needle Type: Echogenic Stimulator Needle     Needle Length: 5cm 5 cm Needle Gauge: 21 and 21 G    Additional Needles:  Procedures: ultrasound guided (picture in chart) and nerve stimulator Interscalene brachial plexus block  Nerve Stimulator or Paresthesia:  Response: biceps flexion, 0.8 mA,   Additional Responses:   Narrative:  Injection made incrementally with aspirations every 5 mL.  Performed by: Personally  Anesthesiologist: Gunnar Bulla  Additional Notes: Functioning IV was confirmed and monitors were applied.  A 31mm 22ga Arrow echogenic stimulator needle was used. Sterile prep and drape,hand hygiene and sterile gloves were used.  Negative aspiration and negative test dose prior to incremental administration of local anesthetic. The patient tolerated the procedure well.  Ultrasound guidance: relevent anatomy identified, needle position confirmed, local anesthetic spread visualized around nerve(s), vascular puncture avoided.  Image printed for medical record. 43ml 0.25%marcaine injected without problems. 0.1 mg epi added tto marcaine.

## 2014-10-06 NOTE — Progress Notes (Signed)
Inpatient Diabetes Program Recommendations  AACE/ADA: New Consensus Statement on Inpatient Glycemic Control (2013)  Target Ranges:  Prepandial:   less than 140 mg/dL      Peak postprandial:   less than 180 mg/dL (1-2 hours)      Critically ill patients:  140 - 180 mg/dL   Reason for Visit: elevated CBG  Diabetes history: Type 2 Outpatient Diabetes medications: Levemir 20 units qhs, Glucotrol 5mg  qday, Metfromin 1000mg  bid Current orders for Inpatient glycemic control: Levemir 20 units qhs, Glucotrol 5mg  qday, Metfromin 1000mg  bid    Consider adding Novolog sensitive correction 0-9 units tid with meals and Novolog 0-5 qhs.  Gentry Fitz, RN, BA, MHA, CDE Diabetes Coordinator Inpatient Diabetes Program  [phone] (Team Pager) [phone] ([place]) 10/06/2014 4:44 PM

## 2014-10-06 NOTE — H&P (Signed)
Paper H&P to be scanned into permanent record. H&P reviewed. No changes. 

## 2014-10-07 LAB — GLUCOSE, CAPILLARY
GLUCOSE-CAPILLARY: 181 mg/dL — AB (ref 65–99)
Glucose-Capillary: 74 mg/dL (ref 65–99)
Glucose-Capillary: 229 mg/dL — ABNORMAL HIGH (ref 65–99)
Glucose-Capillary: 237 mg/dL — ABNORMAL HIGH (ref 65–99)

## 2014-10-07 LAB — CBC
HCT: 30.4 % — ABNORMAL LOW (ref 35.0–47.0)
HEMOGLOBIN: 10.2 g/dL — AB (ref 12.0–16.0)
MCH: 33.2 pg (ref 26.0–34.0)
MCHC: 33.7 g/dL (ref 32.0–36.0)
MCV: 98.7 fL (ref 80.0–100.0)
Platelets: 221 10*3/uL (ref 150–440)
RBC: 3.08 MIL/uL — AB (ref 3.80–5.20)
RDW: 14.2 % (ref 11.5–14.5)
WBC: 17.9 10*3/uL — ABNORMAL HIGH (ref 3.6–11.0)

## 2014-10-07 LAB — BASIC METABOLIC PANEL
Anion gap: 7 (ref 5–15)
BUN: 13 mg/dL (ref 6–20)
CALCIUM: 8.4 mg/dL — AB (ref 8.9–10.3)
CHLORIDE: 104 mmol/L (ref 101–111)
CO2: 28 mmol/L (ref 22–32)
CREATININE: 0.62 mg/dL (ref 0.44–1.00)
GFR calc Af Amer: >60 mL/min (ref >60)
GLUCOSE: 83 mg/dL (ref 65–99)
Potassium: 4.1 mmol/L (ref 3.5–5.1)
Sodium: 139 mmol/L (ref 135–145)

## 2014-10-07 MED ORDER — OXYCODONE HCL 5 MG PO TABS: 5.0000 mg | ORAL_TABLET | ORAL | Status: DC | PRN

## 2014-10-07 NOTE — Progress Notes (Signed)
Physical Therapy Treatment Patient Details Name: Jodi Reeves MRN: [id number] DOB: 1927/05/11 Today's Date: 10/07/2014    History of Present Illness The patient is an 81 year old female who sustained the above-noted injury nearly 2 weeks ago when she apparently lost her balance and fell while getting up from the couch. She presented to the emergency room where x-rays demonstrated the above-noted findings. Initially, the fracture alignment did not appear too bad and some thought was given to treat the injury nonsurgically. However, upon her follow-up visit last week, repeat x-rays demonstrated an unacceptable displacement of the proximal humerus fracture components. She presents at this time for a left shoulder hemiarthroplasty versus a reverse left total shoulder arthroplasty.    PT Comments    Pt tolerating treatment session well, motivated and able to complete entire PT sesssion as planned. Pt continues to make progress toward goals as evidenced by improved tolerance to PROM. Pt's greatest limitation continues to be general malaise which continues to limit ability to perform mobility at baseline function. Patient presenting with impairment of strength, pain, range of motion, balance, and activity tolerance, limiting ability to perform ADL and mobility tasks at  baseline level of function. Patient will benefit from skilled intervention to address the above impairments and limitations, in order to restore to prior level of function, improve patient safety upon discharge, and to decrease caregiver burden.    Follow Up Recommendations  SNF     Equipment Recommendations  Cane    Recommendations for Other Services OT consult     Precautions / Restrictions Precautions Precautions: Shoulder Type of Shoulder Precautions: PROM only; avoid active abduction and resisted shoulder rotation. Shoulder Interventions: Shoulder sling/immobilizer;Off for dressing/bathing/exercises Precaution Booklet  Issued: No Restrictions Weight Bearing Restrictions: No    Mobility  Bed Mobility Overal bed mobility: Needs Assistance Bed Mobility: Supine to Sit     Supine to sit: Supervision     General bed mobility comments: Not performed this session.  Transfers Overall transfer level: Independent Equipment used: Straight cane Transfers: Sit to/from Stand Sit to Stand: Supervision         General transfer comment: Not performed this session.  Ambulation/Gait Ambulation/Gait assistance: Min guard Ambulation Distance (Feet): 60 Feet Assistive device: Straight cane   Gait velocity: Not performed this session. Gait velocity interpretation: <1.8 ft/sec, indicative of risk for recurrent falls General Gait Details: Very slow and guarded, demonstrating safe intermittent use of AD.    Stairs            Wheelchair Mobility    Modified Rankin (Stroke Patients Only)       Balance Overall balance assessment: No apparent balance deficits (not formally assessed);History of Falls;Modified Independent                                  Cognition Arousal/Alertness: Awake/alert Behavior During Therapy: WFL for tasks assessed/performed Overall Cognitive Status: Within Functional Limits for tasks assessed                      Exercises Shoulder Exercises Pendulum Exercise: Standing;20 reps;Left;Self ROM;PROM (10x R/L, 10x fwd/bwd) Shoulder Flexion: PROM;Left;15 reps;Seated (0-75 degrees) Elbow Flexion: AAROM;Left;10 reps;Seated Elbow Extension: AAROM;Left;10 reps;Seated Wrist Flexion: AROM;Seated;Left;10 reps Wrist Extension: AROM;Seated;Left;10 reps Digit Composite Flexion: AROM;15 reps;Squeeze ball;Seated;Left Hand Exercises Forearm Supination: AROM;Seated;Left;10 reps Forearm Pronation: AROM;Seated;Left;10 reps    General Comments        Pertinent Vitals/Pain Pain Assessment:  0-10 Pain Score: 7     Home Living                       Prior Function            PT Goals (current goals can now be found in the care plan section) Acute Rehab PT Goals Patient Stated Goal: Would like to eventually return to being able to sleep in bad safely.  PT Goal Formulation: With patient/family Time For Goal Achievement: 10/21/14 Potential to Achieve Goals: Fair Progress towards PT goals: Progressing toward goals    Frequency  BID    PT Plan Current plan remains appropriate    Co-evaluation             End of Session Equipment Utilized During Treatment: Other (comment) (Arm sling. ) Activity Tolerance: Patient tolerated treatment well Patient left: in bed;with call bell/phone within reach;with family/visitor present;Other (comment);with bed alarm set     Time: [phone] PT Time Calculation (min) (ACUTE ONLY): 23 min  Charges:  $Therapeutic Exercise: 23-37 mins $Therapeutic Activity: 8-22 mins                    G Codes:      Buccola,Allan C 10-27-2014, 4:54 PM  4:55 PM  Etta Grandchild, PT, DPT  License # [id number]

## 2014-10-07 NOTE — Discharge Instructions (Addendum)
Diet: As you were doing prior to hospitalization   Shower:  May shower but keep the wounds dry, use an occlusive plastic wrap, NO SOAKING IN TUB.  If the bandage gets wet, change with a clean dry gauze.  Dressing:  You may change your dressing as needed. Change the dressing with sterile gauze dressing.    Activity:  Increase activity slowly as tolerated, but follow the weight bearing instructions below.  No lifting or driving for 6 weeks.  Weight Bearing: No weight bearing with upper left extremity.  Continue PROM only, avoid active abduction and resisted shoulder rotation.  To prevent constipation: you may use a stool softener such as -  Colace (over the counter) 100 mg by mouth twice a day  Drink plenty of fluids (prune juice may be helpful) and high fiber foods Miralax (over the counter) for constipation as needed.    Itching:  If you experience itching with your medications, try taking only a single pain pill, or even half a pain pill at a time.  You may take up to 10 pain pills per day, and you can also use benadryl over the counter for itching or also to help with sleep.   Precautions:  If you experience chest pain or shortness of breath - call 911 immediately for transfer to the hospital emergency department!!  If you develop a fever greater that 101 F, purulent drainage from wound, increased redness or drainage from wound, or calf pain-Call [facility]                                              Follow- Up Appointment:  Please call for an appointment to be seen in 10-12 days at [facility]

## 2014-10-07 NOTE — Progress Notes (Signed)
  Subjective: 1 Day Post-Op Procedure(s) (LRB): REVERSE SHOULDER ARTHROPLASTY (Left) Patient reports pain as severe.   Patient seen in rounds with Dr. Roland Rack. Patient is well, but has had some minor complaints of pain Plan is to go Home with home healthcare or rehab facility after hospital stay. Negative for chest pain and shortness of breath Fever: no Gastrointestinal:Negative for nausea and vomiting  Objective: Vital signs in last 24 hours: Temp:  [97.4 F (36.3 C)-98.9 F (37.2 C)] 98.9 F (37.2 C) (06/15 0725) Pulse Rate:  [34-123] 68 (06/15 0725) Resp:  [15-18] 16 (06/15 0725) BP: (112-155)/(50-80) 145/71 mmHg (06/15 0725) SpO2:  [99 %-100 %] 100 % (06/15 0725) FiO2 (%):  [28 %] 28 % (06/14 1529) Weight:  [81.194 kg (179 lb)] 81.194 kg (179 lb) (06/14 0842)  Intake/Output from previous day:  Intake/Output Summary (Last 24 hours) at 10/07/14 0800 Last data filed at 10/07/14 0445  Gross per 24 hour  Intake   1050 ml  Output   1700 ml  Net   -650 ml    Intake/Output this shift:    Labs:  Recent Labs  10/07/14 0704  HGB 10.2*    Recent Labs  10/07/14 0704  WBC 17.9*  RBC 3.08*  HCT 30.4*  PLT PENDING    Recent Labs  10/07/14 0704  NA 139  K 4.1  CL 104  CO2 28  BUN 13  CREATININE 0.62  GLUCOSE 83  CALCIUM 8.4*    Recent Labs  10/06/14 0903  INR 1.17   Blood Glucose: 166 on 6/14  EXAM General - Patient is Alert, Appropriate and Oriented Extremity - Neurologically intact Neurovascular intact Sensation intact distally Incision: dressing C/D/I Compartment soft Dressing/Incision - clean, dry, no drainage Motor Function - intact, moving foot and toes well on exam. Homan's sign negative  Past Medical History  Diagnosis Date  . Atrial fibrillation   . DM2 (diabetes mellitus, type 2)   . HLD (hyperlipidemia)   . HTN (hypertension)   . Hypothyroidism   . Osteoporosis   . Anxiety   . GERD (gastroesophageal reflux disease)      esophageal web  . Granuloma annulare     [name].   . Atrial fibrillation   . Ovarian cancer   . Edema, lower extremity   . Balance disorder   . Incontinence of bowel     Assessment/Plan: 1 Day Post-Op Procedure(s) (LRB): REVERSE SHOULDER ARTHROPLASTY (Left) Active Problems:   Humeral head fracture  Estimated body mass index is 30.71 kg/(m^2) as calculated from the following:   Height as of this encounter: 5\' 4"  (1.626 m).   Weight as of this encounter: 81.194 kg (179 lb). Advance diet Up with therapy  Remove Foley today Plan on discharge tomorrow.  Care management assist with discharge.  DVT Prophylaxis - Xarelto, Foot Pumps and TED hose   J. Cameron Proud, PA-C Orthopaedic Surgery 10/07/2014, 8:00 AM

## 2014-10-07 NOTE — Clinical Social Work Note (Signed)
Jodi Reeves with [name] has approved patient to go to [facility]. Patient and son are aware. [place] notified. Shela Leff MSW,LCSWA [phone]

## 2014-10-07 NOTE — Discharge Summary (Signed)
Physician Discharge Summary  Subjective: 1 Day Post-Op Procedure(s) (LRB): REVERSE SHOULDER ARTHROPLASTY (Left) Patient reports pain as moderate.   Patient seen in rounds with Dr. Roland Rack. Patient is well, but has had some minor complaints of pain Patient is ready to go to [place] [place] facility.  Physician Discharge Summary  Patient ID: Jodi Reeves MRN: [id number] DOB/AGE: Apr 08, 1928 81 y.o.  Admit date: 10/06/2014 Discharge date: 10/07/2014  Admission Diagnoses:  Discharge Diagnoses:  Active Problems:   Humeral head fracture   Discharged Condition: fair  Hospital Course: Patient: Jodi Reeves  Pre-Op Diagnosis: Displaced 4-part left proximal humerus fracture.  Post-Op Diagnosis: Same.  Procedure: Reverse left total shoulder arthroplasty.  Surgeon: Pascal Lux, MD  Assistant: Cameron Proud, PA-C  Anesthesia: General endotracheal with an interscalene block placed preoperatively by the anesthesiologist.  Findings: As above.  Complications: None  EBL: 250 cc  Fluids: 1200 cc crystalloid  UOP: 600 cc  TT: None  Drains: None  Closure: Staples  Implants: Biomet Comprehensive system with a #8 cemented humeral fracture stem, a 10 mm metaphyseal sleeve, a #2 distal cement restrictor, a 44 mm humeral tray with a +3 mm insert, and a mini-base plate with a 36 mm glenosphere  Treatments: antibiotics: vancomycin, analgesia: acetaminophen, Dilaudid and Oxycodone and therapies: PT and OT  Patient stayed in hospital until POD #2.  Pt improved each day while staying in hospital.  Pt has had a bowel movement while hospitalized.  Pt was seen by PT and tolerated sessions well, motivated and able to complete her entire PT session as planned.  PT recommended SNF to address impairments and limitations.  Pt will be discharged to [facility].  Discharge Exam: Blood pressure 145/71, pulse 68, temperature 98.9 F (37.2 C), temperature source  Oral, resp. rate 16, height 5\' 4"  (1.626 m), weight 81.194 kg (179 lb), SpO2 100 %.   Disposition: Stable, discharge to SNF     Medication List    TAKE these medications        BD PEN NEEDLE NANO U/F 32G X 4 MM Misc  Generic drug:  Insulin Pen Needle  Inject 1 Syringe as directed as needed.     bisoprolol-hydrochlorothiazide 2.5-6.25 MG per tablet  Commonly known as:  ZIAC  Take 1 tablet by mouth daily.     CALCIUM & MAGNESIUM CARBONATES PO  Take 2 capsules by mouth daily.     CENTRUM ADULTS PO  Take 1 tablet by mouth daily.     digoxin 0.125 MG tablet  Commonly known as:  LANOXIN  Take 1 tablet (125 mcg total) by mouth daily.     FA-Vitamin B-6-Vitamin B-12 2.2-25-0.5 MG Tabs  Place under the tongue daily.     fluorometholone 0.1 % ophthalmic suspension  Commonly known as:  FML  Place 1 drop into both eyes as needed.     furosemide 20 MG tablet  Commonly known as:  LASIX  Take 1 tablet (20 mg total) by mouth 2 (two) times daily as needed.     glipiZIDE 5 MG tablet  Commonly known as:  GLUCOTROL  Take 5 mg by mouth daily before breakfast.     glucose blood test strip  Commonly known as:  ONE TOUCH ULTRA TEST  Use to test blood sugar twice daily dx: 250.00     insulin NPH Human 100 UNIT/ML injection  Commonly known as:  HUMULIN N,NOVOLIN N  Inject 20 Units into the skin at bedtime.     levothyroxine 50  MCG tablet  Commonly known as:  SYNTHROID, LEVOTHROID  Take 50 mcg by mouth daily before breakfast.     metFORMIN 500 MG 24 hr tablet  Commonly known as:  GLUCOPHAGE-XR  Take two tablet by mouth in the morning and two tablets by mouth in the evening     MIRALAX powder  Generic drug:  polyethylene glycol powder  Take 17 g by mouth as needed.     ondansetron 4 MG tablet  Commonly known as:  ZOFRAN  Take 1 tablet (4 mg total) by mouth daily as needed for nausea or vomiting.     oxyCODONE 5 MG immediate release tablet  Commonly known as:  Oxy  IR/ROXICODONE  Take 1-2 tablets (5-10 mg total) by mouth every 4 (four) hours as needed for severe pain (pain).     PROBIOTIC & ACIDOPHILUS EX ST PO  Take 1 capsule by mouth daily.     rivaroxaban 20 MG Tabs tablet  Commonly known as:  XARELTO  Take 1 tablet (20 mg total) by mouth daily.     vitamin C 1000 MG tablet  Take 1,000 mg by mouth daily.     Vitamin D3 2000 UNITS Tabs  Take by mouth daily.           Follow-up Information    Follow up with Corky Mull, MD In 10 days.   Specialty:  Surgery   Why:  For suture removal, For wound re-check   Contact information:   [place] [address] [phone]       Signed: Judson Roch 10/07/2014, 8:10 AM   Objective: Vital signs in last 24 hours: Temp:  [97.4 F (36.3 C)-98.9 F (37.2 C)] 98.9 F (37.2 C) (06/15 0725) Pulse Rate:  [34-123] 68 (06/15 0725) Resp:  [15-18] 16 (06/15 0725) BP: (112-155)/(50-80) 145/71 mmHg (06/15 0725) SpO2:  [99 %-100 %] 100 % (06/15 0725) FiO2 (%):  [28 %] 28 % (06/14 1529) Weight:  [81.194 kg (179 lb)] 81.194 kg (179 lb) (06/14 0842)  Intake/Output from previous day:  Intake/Output Summary (Last 24 hours) at 10/07/14 0810 Last data filed at 10/07/14 0445  Gross per 24 hour  Intake   1050 ml  Output   1700 ml  Net   -650 ml    Intake/Output this shift:    Labs:  Recent Labs  10/07/14 0704  HGB 10.2*    Recent Labs  10/07/14 0704  WBC 17.9*  RBC 3.08*  HCT 30.4*  PLT 221    Recent Labs  10/07/14 0704  NA 139  K 4.1  CL 104  CO2 28  BUN 13  CREATININE 0.62  GLUCOSE 83  CALCIUM 8.4*    Recent Labs  10/06/14 0903  INR 1.17    EXAM: General - Patient is Alert and Appropriate Extremity - Neurologically intact ABD soft Dorsiflexion/Plantar flexion intact Incision: dressing C/D/I Incision - clean, dry, no drainage Motor Function -  Intact, b/l lower extremity, b/l upper extremity  Assessment/Plan: 1  Day Post-Op Procedure(s) (LRB): REVERSE SHOULDER ARTHROPLASTY (Left) Procedure(s) (LRB): REVERSE SHOULDER ARTHROPLASTY (Left) Past Medical History  Diagnosis Date  . Atrial fibrillation   . DM2 (diabetes mellitus, type 2)   . HLD (hyperlipidemia)   . HTN (hypertension)   . Hypothyroidism   . Osteoporosis   . Anxiety   . GERD (gastroesophageal reflux disease)     esophageal web  . Granuloma annulare     [name].   Marland Kitchen  Atrial fibrillation   . Ovarian cancer   . Edema, lower extremity   . Balance disorder   . Incontinence of bowel    Active Problems:   Humeral head fracture  Estimated body mass index is 30.71 kg/(m^2) as calculated from the following:   Height as of this encounter: 5\' 4"  (1.626 m).   Weight as of this encounter: 81.194 kg (179 lb). Advance diet Discharge to SNF Diet - Regular diet Follow up - in 10-12 days Activity - Left upper extremity PROM only; avoid active abduction and resisted shoulder rotation. Disposition - Skilled nursing facility  Patient will be discharged to the [facility] facility. Condition Upon Discharge - Good DVT Prophylaxis - Xarelto, Foot Pumps and TED hose  J. Cameron Proud, PA-C Orthopaedic Surgery 10/07/2014, 8:10 AM   U/A from yesterday shows many WBC and bacteria.  Will start Cipro x5 days as precaution, given that she has a newly placed prosthetic device in her shoulder.  Otherwise, patient cleared to go to rehab today pending bed availability.

## 2014-10-07 NOTE — Evaluation (Signed)
Physical Therapy Evaluation Patient Details Name: Jodi Reeves MRN: [id number] DOB: 07-May-1927 Today's Date: 10/07/2014   History of Present Illness   Pt experienced a fall at home about 13 days ago and was subsequently immobilized after trauma to L shoulder. Upon follow-up and imaging, physician noted that fracture to humeral head was worse than previously believed. Ortho recommended surgical intervention and pt elected to undergo reverse total shoulder replacement on the LUE.    Clinical Impression  Pt tolerating evaluation well, motivated and able to complete entire PT session, although moderately limited by nausea. Pt expressed need to perform toileting during session, which resulted in opporturnity to practice additional functional mobility and balance activities. Pt's greatest limitation continues to be generalized weakness and poor balance which of prime concern considering pt's recent history of hospitalization related to falls.Given patients history of falls, and newly imposed immobility, weakness, and pain in LUE, pt independence is of concern with new difficulty with performing all ADL as well as self administration of insulin. Patient presenting with impairment of strength, pain, range of motion, balance, and activity tolerance, limiting ability to perform ADL and mobility tasks at  baseline level of function. Patient will benefit from skilled intervention to address the above impairments and limitations, in order to restore to prior level of function, improve patient safety upon discharge, and to decrease caregiver burden.      Follow Up Recommendations SNF    Equipment Recommendations  Cane    Recommendations for Other Services OT consult     Precautions / Restrictions Precautions Precautions: Shoulder Type of Shoulder Precautions: PROM only; avoid active abduction and resisted shoulder rotation. Shoulder Interventions: Shoulder sling/immobilizer;Off for  dressing/bathing/exercises Precaution Booklet Issued: No Restrictions Weight Bearing Restrictions: No      Mobility  Bed Mobility Overal bed mobility: Needs Assistance Bed Mobility: Supine to Sit     Supine to sit: Supervision     General bed mobility comments: Maximal effort, but able to perform with additonal time and RUE only.   Transfers Overall transfer level: Needs assistance Equipment used: Straight cane Transfers: Sit to/from Stand Sit to Stand: Supervision         General transfer comment: performed 4x in room with MinGuard, SPC, and mod-max effort to perform. Pt reports feeling weak and and nauseated.   Ambulation/Gait Ambulation/Gait assistance: Min guard Ambulation Distance (Feet): 60 Feet Assistive device: Straight cane     Gait velocity interpretation: <1.8 ft/sec, indicative of risk for recurrent falls General Gait Details: Very slow and guarded, demonstrating safe intermittent use of AD.   Stairs            Wheelchair Mobility    Modified Rankin (Stroke Patients Only)       Balance Overall balance assessment: No apparent balance deficits (not formally assessed);History of Falls;Modified Independent                                           Pertinent Vitals/Pain Pain Assessment: 0-10 Pain Score: 5  Pain Descriptors / Indicators: Aching Pain Intervention(s): Monitored during session;Ice applied    Home Living Family/patient expects to be discharged to:: Private residence Living Arrangements: Alone Available Help at Discharge: Family Type of Home: House Home Access: Stairs to enter Entrance Stairs-Rails: Right Entrance Stairs-Number of Steps: 3 Home Layout: Two level;Able to live on main level with bedroom/bathroom Home Equipment: Kasandra Knudsen - single point  Prior Function Level of Independence: Independent with assistive device(s)         Comments: Previously still driving and Indep in IADL.      Hand  Dominance   Dominant Hand: Right    Extremity/Trunk Assessment   Upper Extremity Assessment: LUE deficits/detail       LUE Deficits / Details: Pt has been imobilized in sling for 2 weeks, with total mobility restrictions. Today pt presents swollen with mild bruising in brachial area, mildly edematous in antebrachium and mildly weak, with imposed mobililty restirctions.    Lower Extremity Assessment: Generalized weakness      Cervical / Trunk Assessment: Kyphotic  Communication   Communication: No difficulties  Cognition Arousal/Alertness: Awake/alert Behavior During Therapy: Anxious (nauseated and generally feeling poor until after RN administered antiemetics. ) Overall Cognitive Status: Within Functional Limits for tasks assessed                      General Comments      Exercises Shoulder Exercises Pendulum Exercise: Standing;20 reps;Left;Self ROM;PROM (10x R/L, 10x fwd/bwd) Elbow Flexion: AAROM;Left;10 reps;Seated Elbow Extension: AAROM;Left;10 reps;Seated Wrist Flexion: AROM;Seated;Left;10 reps Wrist Extension: AROM;Seated;Left;10 reps Digit Composite Flexion: AROM;15 reps;Squeeze ball;Seated;Left Hand Exercises Forearm Supination: AROM;Seated;Left;10 reps Forearm Pronation: AROM;Seated;Left;10 reps      Assessment/Plan    PT Assessment Patient needs continued PT services  PT Diagnosis Difficulty walking;Acute pain;Generalized weakness   PT Problem List Decreased strength;Decreased range of motion;Decreased knowledge of use of DME;Decreased activity tolerance;Decreased knowledge of precautions;Decreased mobility  PT Treatment Interventions DME instruction;Balance training;Gait training;Stair training;Functional mobility training;Therapeutic activities;Therapeutic exercise;Patient/family education   PT Goals (Current goals can be found in the Care Plan section) Acute Rehab PT Goals Patient Stated Goal: Would like to eventually return to being able to sleep  in bad safely.  PT Goal Formulation: With patient/family Time For Goal Achievement: 10/21/14 Potential to Achieve Goals: Fair    Frequency BID   Barriers to discharge        Co-evaluation               End of Session Equipment Utilized During Treatment: Gait belt;Other (comment) (L shoulder sling ) Activity Tolerance: Other (comment) (Limited by malaise and nausea. ) Patient left: in bed;with call bell/phone within reach;with family/visitor present;Other (comment) (seated EOB c tray in front eating lunch. ) Nurse Communication: Precautions;Other (comment);Mobility status (replacing icepack portion of slings. )         Time: 7106-2694 PT Time Calculation (min) (ACUTE ONLY): 78 min   Charges:   PT Evaluation $Initial PT Evaluation Tier I: 1 Procedure PT Treatments $Therapeutic Exercise: 23-37 mins $Therapeutic Activity: 8-22 mins   PT G Codes:        Aleeta Schmaltz C 10/10/14, 1:27 PM 1:35 PM  Etta Grandchild, PT, DPT [place] License # [id number]

## 2014-10-07 NOTE — Care Management Note (Signed)
Case Management Note  Patient Details  Name: TRAMAINE SNELL MRN: [id number] Date of Birth: [birth year]  Subjective/Objective:                  Met with patient and her son Octavia Bruckner to discuss discharge planning. She is from home alone but Octavia Bruckner states he is prepared to provide patient with assistance. Patient did not have a preference for home health agency since she has never needed one before. She usually is independent at home, walks with a cane, and drives.  Action/Plan:  Referral called to [facility]. RNCM will continue to follow.   Expected Discharge Date:                  Expected Discharge Plan:     In-House Referral:     Discharge planning Services  CM Consult  Post Acute Care Choice:    Choice offered to:  Patient  DME Arranged:  N/A DME Agency:     HH Arranged:  PT HH Agency:  [facility]  Status of Service:     Medicare Important Message Given:    Date Medicare IM Given:    Medicare IM give by:    Date Additional Medicare IM Given:    Additional Medicare Important Message give by:     If discussed at [place] of Stay Meetings, dates discussed:    Additional Comments:  Marshell Garfinkel, RN 10/07/2014, 10:00 AM

## 2014-10-07 NOTE — Progress Notes (Signed)
Patient is alert and oriented x2. POD 1 left shoulder. Dressing  cdi and ice packs. Spo2 [place] intact, no acute distress noted. Patient denies pain.  Repositioning completed q2hrs for comfort.

## 2014-10-07 NOTE — Clinical Social Work Note (Signed)
Clinical Social Work Assessment  Patient Details  Name: Jodi Reeves MRN: [id number] Date of Birth: 1927/08/04  Date of referral:  10/07/14               Reason for consult:  Facility Placement                Permission sought to share information with:  Family Supports Permission granted to share information::  Yes, Verbal Permission Granted  Name::        Agency::     Relationship::     Contact Information:     Housing/Transportation Living arrangements for the past 2 months:   (home) Source of Information:  Patient, Adult Children Patient Interpreter Needed:  None Criminal Activity/Legal Involvement Pertinent to Current Situation/Hospitalization:  No - Comment as needed Significant Relationships:  Adult Children Lives with:  Self Do you feel safe going back to the place where you live?  Yes Need for family participation in patient care:  Yes (Comment)  Care giving concerns:  Lives alone  Social Worker assessment / plan:  CSW spoke with patient and her son, Jodi Reeves this afternoon regarding recommendations made by physical therapy. Patient would like to go to rehab at [facility] if possible. CSW explained that her insurance would have to approve for her to go but that a bedsearch will be initiated. Patient was agreeable to this.   Information faxed to [facility] [place] (p: [phone] and fax: [phone]) for review.  Employment status:  Retired Nurse, adult PT Recommendations:  [place] / Referral to community resources:  [place]  Patient/Family's Response to care:  Hopeful that insurance will approve for STR.  Patient/Family's Understanding of and Emotional Response to Diagnosis, Current Treatment, and Prognosis:  Patient and son verbalized understanding that patient's insurance will have to approve for her to go to STR.  Emotional Assessment Appearance:  Appears stated age Attitude/Demeanor/Rapport:    (pleasant and cooperative) Affect (typically observed):  Accepting, Appropriate, Adaptable Orientation:  Oriented to Self, Oriented to Place, Oriented to  Time, Oriented to Situation Alcohol / Substance use:  Not Applicable Psych involvement (Current and /or in the community):  No (Comment)  Discharge Needs  Concerns to be addressed:  Basic Needs Readmission within the last 30 days:  No Current discharge risk:  None Barriers to Discharge:  No Barriers Identified   Shela Leff, LCSW 10/07/2014, 2:24 PM

## 2014-10-07 NOTE — Clinical Social Work Note (Signed)
Patient has received bed offer from facility of choice: [facility]. CSW has made Inez Catalina at [employer] aware and will await insurance disposition. Shela Leff MSW,LCSWA [phone]

## 2014-10-08 ENCOUNTER — Encounter
Admission: RE | Admit: 2014-10-08 | Discharge: 2014-10-08 | Disposition: A | Discharge status: Home / Self Care | Payer: Medicare Other | Source: Ambulatory Visit | Attending: Internal Medicine | Admitting: Internal Medicine

## 2014-10-08 LAB — GLUCOSE, CAPILLARY
Glucose-Capillary: 83 mg/dL (ref 65–99)
Glucose-Capillary: 148 mg/dL — ABNORMAL HIGH (ref 65–99)

## 2014-10-08 LAB — URINALYSIS COMPLETE WITH MICROSCOPIC (ARMC ONLY)
BILIRUBIN URINE: NEGATIVE
GLUCOSE, UA: NEGATIVE mg/dL
Ketones, ur: NEGATIVE mg/dL
Nitrite: POSITIVE — AB
Protein, ur: 30 mg/dL — AB
Specific Gravity, Urine: 1.017 (ref 1.005–1.030)
pH: 7 (ref 5.0–8.0)

## 2014-10-08 LAB — BASIC METABOLIC PANEL
Anion gap: 6 (ref 5–15)
BUN: 15 mg/dL (ref 6–20)
CO2: 29 mmol/L (ref 22–32)
Calcium: 8.2 mg/dL — ABNORMAL LOW (ref 8.9–10.3)
Chloride: 101 mmol/L (ref 101–111)
Creatinine, Ser: 0.62 mg/dL (ref 0.44–1.00)
GFR calc Af Amer: >60 mL/min (ref >60)
GFR calc non Af Amer: >60 mL/min (ref >60)
GLUCOSE: 92 mg/dL (ref 65–99)
POTASSIUM: 3.7 mmol/L (ref 3.5–5.1)
Sodium: 136 mmol/L (ref 135–145)

## 2014-10-08 LAB — CBC
HEMATOCRIT: 28 % — AB (ref 35.0–47.0)
Hemoglobin: 9.2 g/dL — ABNORMAL LOW (ref 12.0–16.0)
MCH: 32.4 pg (ref 26.0–34.0)
MCHC: 32.9 g/dL (ref 32.0–36.0)
MCV: 98.5 fL (ref 80.0–100.0)
Platelets: 223 10*3/uL (ref 150–440)
RBC: 2.84 MIL/uL — ABNORMAL LOW (ref 3.80–5.20)
RDW: 14.1 % (ref 11.5–14.5)
WBC: 13.5 10*3/uL — ABNORMAL HIGH (ref 3.6–11.0)

## 2014-10-08 LAB — SURGICAL PATHOLOGY

## 2014-10-08 MED ORDER — CIPROFLOXACIN HCL 500 MG PO TABS: 500.0000 mg | ORAL_TABLET | Freq: Two times a day (BID) | ORAL | Status: DC

## 2014-10-08 NOTE — Progress Notes (Signed)
Discharge Note:  Report called to Melody Marcelline Deist RN at  [facility]. VSS,  Discharge packet handed to son, pt wheeled to lobby and son providing transportation to [employer]

## 2014-10-08 NOTE — Progress Notes (Signed)
Physical Therapy Treatment Patient Details Name: Jodi Reeves MRN: [id number] DOB: June 03, 1927 Today's Date: 10/08/2014    History of Present Illness The patient is an 81 year old female who sustained the above-noted injury nearly 2 weeks ago when she apparently lost her balance and fell while getting up from the couch. She presented to the emergency room where x-rays demonstrated the above-noted findings. Initially, the fracture alignment did not appear too bad and some thought was given to treat the injury nonsurgically. However, upon her follow-up visit last week, repeat x-rays demonstrated an unacceptable displacement of the proximal humerus fracture components. She presents at this time for a left shoulder hemiarthroplasty versus a reverse left total shoulder arthroplasty.    PT Comments    Pt tolerating treatment session well, motivated and able to complete entire PT sesssion as planned. Pt continues to make progress toward goals as evidenced by improved pain control as well as good compliance to HEP assignments yesterday. Pt's greatest limitations continue to be generalized weakness and balance deficits, which continue to limit ability to perform mobility, ADL, and IADL at baseline function. Patient presenting with impairment of strength, pain, range of motion, balance, and activity tolerance, limiting ability to perform ADL and mobility tasks at  baseline level of function. Patient will benefit from skilled intervention to address the above impairments and limitations, in order to restore to prior level of function, improve patient safety upon discharge, and to decrease caregiver burden.  \  Follow Up Recommendations  SNF     Equipment Recommendations  Cane    Recommendations for Other Services OT consult     Precautions / Restrictions Precautions Precautions: Shoulder Type of Shoulder Precautions: PROM only; avoid active abduction and resisted shoulder rotation. Shoulder  Interventions: Shoulder sling/immobilizer;Off for dressing/bathing/exercises Precaution Booklet Issued: No Restrictions Weight Bearing Restrictions: No    Mobility  Bed Mobility               General bed mobility comments: Received seated EOB.   Transfers Overall transfer level: Needs assistance Equipment used: None Transfers: Sit to/from Stand Sit to Stand: Supervision         General transfer comment: Transfered to [facility] once standing and balanced.   Ambulation/Gait Ambulation/Gait assistance: Min guard Ambulation Distance (Feet): 180 Feet Assistive device: Straight cane   Gait velocity: 1.22ft/sec  Gait velocity interpretation: <1.8 ft/sec, indicative of risk for recurrent falls General Gait Details: Good sequencing with gain, three instances of unsteadiness s LOB.    Stairs            Wheelchair Mobility    Modified Rankin (Stroke Patients Only)       Balance Overall balance assessment: History of Falls;Needs assistance Sitting-balance support: No upper extremity supported;Feet unsupported Sitting balance-Leahy Scale: Good     Standing balance support: Single extremity supported Standing balance-Leahy Scale: Fair                      Cognition Arousal/Alertness: Awake/alert Behavior During Therapy: WFL for tasks assessed/performed Overall Cognitive Status: Within Functional Limits for tasks assessed                      Exercises Shoulder Exercises Pendulum Exercise: Standing;20 reps;Left;Self ROM;PROM (90 seconds, 5x A/P. 5x R/L ) Shoulder Flexion: PROM;Left;15 reps;Seated (0-75 degree glenohumeral flexion. ) Elbow Flexion: AAROM;Left;10 reps;Seated Elbow Extension: Left;10 reps;Seated;PROM Wrist Flexion: AROM;Seated;Left;10 reps Wrist Extension: AROM;Seated;Left;10 reps Digit Composite Flexion: AROM;15 reps;Squeeze ball;Seated;Left Hand Exercises Forearm Supination: AROM;Seated;Left;10  reps Forearm Pronation:  AROM;Seated;Left;10 reps    General Comments        Pertinent Vitals/Pain Pain Assessment: Faces Faces Pain Scale: Hurts even more    Home Living                      Prior Function            PT Goals (current goals can now be found in the care plan section) Acute Rehab PT Goals Patient Stated Goal: Would like to eventually return to being able to sleep in bad safely.  PT Goal Formulation: With patient/family Time For Goal Achievement: 10/21/14 Progress towards PT goals: Progressing toward goals    Frequency  BID    PT Plan Current plan remains appropriate    Co-evaluation             End of Session Equipment Utilized During Treatment: Gait belt Activity Tolerance: Patient tolerated treatment well Patient left: in bed;with nursing/sitter in room     Time: 4401-0272 PT Time Calculation (min) (ACUTE ONLY): 31 min  Charges:  $Therapeutic Exercise: 8-22 mins $Therapeutic Activity: 8-22 mins                    G Codes:      Elkin Belfield C 10-16-2014, 9:33 AM 9:35 AM  Etta Grandchild, PT, DPT [place] License # [id number]

## 2014-10-08 NOTE — Clinical Social Work Note (Signed)
Patient discharging today to [facility]. [name] provided authorization and patient and son are aware and in agreement. Patient and son are aware patient can transport via EMS but that insurance is not guaranteed to cover as patient is ambulatory and is able to sit up in a chair. Patients' son, Octavia Bruckner, is going to transport patient today. Orders sent and nurse to call report.  Shela Leff MSW,LCSWA [phone]

## 2014-10-08 NOTE — Clinical Social Work Placement (Signed)
   CLINICAL SOCIAL WORK PLACEMENT  NOTE  Date:  10/08/2014  Patient Details  Name: Jodi Reeves MRN: [id number] Date of Birth: 1927-05-16  Clinical Social Work is seeking post-discharge placement for this patient at the [place] level of care (*CSW will initial, date and re-position this form in  chart as items are completed):  Yes   Patient/family provided with [facility] Work Department's list of facilities offering this level of care within the geographic area requested by the patient (or if unable, by the patient's family).  Yes   Patient/family informed of their freedom to choose among providers that offer the needed level of care, that participate in Medicare, Medicaid or managed care program needed by the patient, have an available bed and are willing to accept the patient.  Yes   Patient/family informed of [facility]'s ownership interest in [facility] and [facility], as well as of the fact that they are under no obligation to receive care at these facilities.  PASRR submitted to EDS on       PASRR number received on 10/06/14     Existing PASRR number confirmed on       FL2 transmitted to all facilities in geographic area requested by pt/family on 10/06/14     FL2 transmitted to all facilities within larger geographic area on       Patient informed that his/her managed care company has contracts with or will negotiate with certain facilities, including the following:        Yes   Patient/family informed of bed offers received.  Patient chooses bed at  [facility])     Physician recommends and patient chooses bed at  [facility])    Patient to be transferred to  [facility]) on 10/08/14.  Patient to be transferred to facility by  (family car)     Patient family notified on 10/08/14 of transfer.  Name of family member notified:   (Tim; son)     PHYSICIAN       Additional Comment:     _______________________________________________ Shela Leff, LCSW 10/08/2014, 9:54 AM

## 2014-10-08 NOTE — Progress Notes (Signed)
Patient is alert and oriented x2. Patient mental status change in baseline at night. POD 2 left shoulder.  Dressing clean dry intact. Ice packs placed for comfort. Pain controlled with PRN pain medication. Voiding without difficulty. Urine malodorous, nursing will continue to monitor.

## 2014-10-08 NOTE — Care Management Note (Signed)
Case Management Note  Patient Details  Name: Jodi Reeves MRN: [id number] Date of Birth: 01-06-1928  Subjective/Objective:   Discharging to [facility] today                 Action/Plan:   Expected Discharge Date:                  Expected Discharge Plan:  [place]  In-House Referral:     Discharge planning Services  CM Consult  Post Acute Care Choice:    Choice offered to:  Patient  DME Arranged:  N/A DME Agency:     HH Arranged:    [place] Agency:     Status of Service:  Completed, signed off  Medicare Important Message Given:  Yes Date Medicare IM Given:  10/08/14 Medicare IM give by:  Orvan July Date Additional Medicare IM Given:    Additional Medicare Important Message give by:     If discussed at [place] of Stay Meetings, dates discussed:    Additional Comments:  Jolly Mango, RN 10/08/2014, 9:43 AM

## 2014-10-09 ENCOUNTER — Encounter: Payer: Self-pay | Admitting: Surgery

## 2014-10-12 LAB — GLUCOSE, CAPILLARY: Glucose-Capillary: 210 mg/dL — ABNORMAL HIGH (ref 65–99)

## 2014-10-13 NOTE — Addendum Note (Signed)
Addendum  created 10/13/14 0819 by Christy Sartorius, CRNA   Modules edited: Anesthesia Responsible Staff

## 2014-10-16 DIAGNOSIS — Z96612 Presence of left artificial shoulder joint: Secondary | ICD-10-CM | POA: Insufficient documentation

## 2014-10-23 ENCOUNTER — Encounter
Admission: RE | Admit: 2014-10-23 | Discharge: 2014-10-23 | Disposition: A | Discharge status: Home / Self Care | Payer: Medicare Other | Source: Ambulatory Visit | Attending: Internal Medicine | Admitting: Internal Medicine

## 2014-11-11 ENCOUNTER — Telehealth: Payer: Self-pay

## 2014-11-11 DIAGNOSIS — S42293D Other displaced fracture of upper end of unspecified humerus, subsequent encounter for fracture with routine healing: Secondary | ICD-10-CM

## 2014-11-11 NOTE — Telephone Encounter (Signed)
Jenny Reichmann with Health [employer] Education [employer] with [name] left v/m requesting cb with update on paperwork sent about bone density information or approved osteoporosis med pt is presently taking.Please advise.

## 2014-11-12 NOTE — Telephone Encounter (Signed)
It is reasonable and considered standard to check a bone density now. See if she is willing to do this and we can proceed if so

## 2014-11-12 NOTE — Telephone Encounter (Signed)
Spoke with [place] and they are asking if pt has ever had bone density? Which I could not find one in her chart. They are suggesting that this be discussed with pt at her wellness visit. Per Jodi Reeves a bone density is recommended 6 mth after her fracture.

## 2014-11-23 ENCOUNTER — Ambulatory Visit: Payer: Medicare Other | Admitting: Podiatry

## 2014-11-24 NOTE — Telephone Encounter (Signed)
Spoke with patient and offered a bone density test as recommended by her insurance [place] after having a fracture and pt refused stating she doesn't need one and that she is allergic to Alendronate (rash) but that she's taking Vit C and D.

## 2014-11-24 NOTE — Telephone Encounter (Signed)
Okay She did have traumatic humerus fracture--not a classic fragility fracture

## 2014-12-14 ENCOUNTER — Ambulatory Visit: Payer: Medicare Other | Admitting: Podiatry

## 2014-12-15 ENCOUNTER — Other Ambulatory Visit: Payer: Self-pay | Admitting: *Deleted

## 2014-12-15 MED ORDER — BISOPROLOL-HYDROCHLOROTHIAZIDE 2.5-6.25 MG PO TABS: 1.0000 | ORAL_TABLET | Freq: Every day | ORAL | Status: DC

## 2014-12-18 NOTE — Telephone Encounter (Signed)
Jodi Reeves with [employer] left v/m wanting to know status of 10/2014 call. I called and spoke with Jodi Reeves and gave info from 11/24/14 on 10:07AM. Jodi Reeves voiced understanding.

## 2014-12-21 ENCOUNTER — Ambulatory Visit (INDEPENDENT_AMBULATORY_CARE_PROVIDER_SITE_OTHER): Payer: Medicare Other | Admitting: Cardiovascular Disease

## 2014-12-21 ENCOUNTER — Encounter: Payer: Self-pay | Admitting: Cardiovascular Disease

## 2014-12-21 VITALS — BP 118/64 | HR 75 | Ht 64.0 in | Wt 170.2 lb

## 2014-12-21 DIAGNOSIS — E785 Hyperlipidemia, unspecified: Secondary | ICD-10-CM

## 2014-12-21 DIAGNOSIS — I5032 Chronic diastolic (congestive) heart failure: Secondary | ICD-10-CM | POA: Diagnosis not present

## 2014-12-21 DIAGNOSIS — I10 Essential (primary) hypertension: Secondary | ICD-10-CM | POA: Diagnosis not present

## 2014-12-21 DIAGNOSIS — E1149 Type 2 diabetes mellitus with other diabetic neurological complication: Secondary | ICD-10-CM

## 2014-12-21 DIAGNOSIS — E1140 Type 2 diabetes mellitus with diabetic neuropathy, unspecified: Secondary | ICD-10-CM

## 2014-12-21 DIAGNOSIS — I481 Persistent atrial fibrillation: Secondary | ICD-10-CM | POA: Diagnosis not present

## 2014-12-21 DIAGNOSIS — S42292S Other displaced fracture of upper end of left humerus, sequela: Secondary | ICD-10-CM

## 2014-12-21 DIAGNOSIS — I4819 Other persistent atrial fibrillation: Secondary | ICD-10-CM

## 2014-12-21 MED ORDER — POTASSIUM CHLORIDE ER 10 MEQ PO TBCR: 10.0000 meq | EXTENDED_RELEASE_TABLET | Freq: Every day | ORAL | Status: DC | PRN

## 2014-12-21 NOTE — Patient Instructions (Addendum)
You are doing well. No medication changes were made.  Please call us if you have new issues that need to be addressed before your next appt.  Your physician wants you to follow-up in: 6 months.  You will receive a reminder letter in the mail two months in advance. If you don't receive a letter, please call our office to schedule the follow-up appointment.   

## 2014-12-21 NOTE — Assessment & Plan Note (Signed)
Blood pressure is well controlled on today's visit. No changes made to the medications. 

## 2014-12-21 NOTE — Assessment & Plan Note (Signed)
She appears relatively euvolemic on today's visit. She takes Lasix daily with no potassium Lab work in June showing relatively normal potassium level Suggested she take extra Lasix after lunch only for severe swelling of her legs with potassium

## 2014-12-21 NOTE — Assessment & Plan Note (Signed)
She is indicated that she does not want a cholesterol medication despite known coronary artery disease

## 2014-12-21 NOTE — Assessment & Plan Note (Signed)
Recovering from her shoulder fracture following fall, participating in PT still with limited range of motion

## 2014-12-21 NOTE — Assessment & Plan Note (Signed)
Heart rate well controlled, continued/chronic atrial fibrillation Tolerating anticoagulation for now. Will monitor in the setting of recent falls

## 2014-12-21 NOTE — Assessment & Plan Note (Signed)
Weight loss has contributed to improved diabetes numbers. Working with Dr. Eddie Dibbles

## 2014-12-21 NOTE — Progress Notes (Signed)
Patient ID: OLENA WILLY, female    DOB: 02-Dec-1927, 81 y.o.   MRN: [id number]  HPI Comments: Ms. 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 No intervention was needed.  She presents for follow-up of her atrial fibrillation  In follow-up today, she reports having a fall June 2016. She had tooth pulled, stitches out, nausea on the day when she fell at home. Fractured her left shoulder, required surgery, rehabilitation for 7 weeks. In the rehabilitation she had hypoglycemia, weight loss. Now at home, working with physical therapy. Weight is still 6 pounds lower than her prior clinic visit. Hemoglobin A1c 6.7 followed by Dr. Eddie Dibbles Balance continues to be very poor, walks with a cane Tolerating anticoagulation Declined a cholesterol pill in the past and again today   Denies having any significant shortness of breath, no tachycardia, no chest discomfort EKG on today's visit shows atrial fibrillation with ventricular rate 75 bpm, no significant ST or T-wave changes  Other past medical history Previous mechanical fall at church while she was hanging up her coat in 2015, She fell backwards, hit her head, was transferred to [facility] While in the emergency room she had chest pain, had head CT scan, MRI, cardiac enzymes which were normal 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   She had amiodarone many years ago and she believes that this caused her thyroid problem.    Allergies  Allergen Reactions  . Alendronate Sodium     REACTION: Rash  . Atorvastatin     REACTION: Muscle aches  . Codeine     REACTION: N \\T \ V  .  Ezetimibe-Simvastatin     REACTION: Muscle aches  . Lovastatin     REACTION: Myalgias  . Penicillins     REACTION: Anaphylaxis  . Rosiglitazone Maleate     REACTION: Edema    Outpatient Encounter Prescriptions as of 12/21/2014  Medication Sig  . Ascorbic Acid (VITAMIN C) 1000 MG tablet Take 1,000 mg by mouth daily.    . BD PEN NEEDLE NANO U/F 32G X 4 MM MISC Inject 1 Syringe as directed as needed.   . bisoprolol-hydrochlorothiazide (ZIAC) 2.5-6.25 MG per tablet Take 1 tablet by mouth daily.  Marland Kitchen CALCIUM & MAGNESIUM CARBONATES PO Take 2 capsules by mouth daily.  . Cholecalciferol (VITAMIN D3) 2000 UNITS TABS Take by mouth daily.  . digoxin (LANOXIN) 0.125 MG tablet Take 1 tablet (125 mcg total) by mouth daily.  . fluorometholone (FML) 0.1 % ophthalmic suspension Place 1 drop into both eyes as needed.   . Folic Acid-Vit X7-WIO M35 (FA-VITAMIN B-6-VITAMIN B-12) 2.2-25-0.5 MG TABS Place under the tongue daily.    . furosemide (LASIX) 20 MG tablet Take 1 tablet (20 mg total) by mouth 2 (two) times daily as needed.  Marland Kitchen glipiZIDE (GLUCOTROL XL) 2.5 MG 24 hr tablet Take 2.5 mg by mouth daily with breakfast.  . glucose blood (ONE TOUCH ULTRA TEST) test strip Use to test blood sugar twice daily dx: 250.00  . insulin NPH Human (HUMULIN N,NOVOLIN N) 100 UNIT/ML injection Inject 16 Units into the skin at bedtime.   Marland Kitchen levothyroxine (SYNTHROID, LEVOTHROID) 50 MCG tablet Take 50 mcg by mouth daily before breakfast.  .  metFORMIN (GLUCOPHAGE-XR) 500 MG 24 hr tablet Take two tablet by mouth in the morning and two tablets by mouth in the evening   . Multiple Vitamins-Minerals (CENTRUM ADULTS PO) Take 1 tablet by mouth daily.  . ondansetron (ZOFRAN) 4 MG tablet Take 1 tablet (4 mg total) by mouth daily as needed for nausea or vomiting.  . polyethylene glycol powder (MIRALAX) powder Take 17 g by mouth as needed.   . rivaroxaban (XARELTO) 20 MG TABS tablet Take 1 tablet (20 mg total) by mouth daily.  . potassium  chloride (K-DUR) 10 MEQ tablet Take 1 tablet (10 mEq total) by mouth daily as needed.  . [DISCONTINUED] ciprofloxacin (CIPRO) 500 MG tablet Take 1 tablet (500 mg total) by mouth 2 (two) times daily. (Patient not taking: Reported on 12/21/2014)  . [DISCONTINUED] glipiZIDE (GLUCOTROL) 5 MG tablet Take 5 mg by mouth daily before breakfast.  . [DISCONTINUED] oxyCODONE (OXY IR/ROXICODONE) 5 MG immediate release tablet Take 1-2 tablets (5-10 mg total) by mouth every 4 (four) hours as needed for severe pain (pain). (Patient not taking: Reported on 12/21/2014)  . [DISCONTINUED] Probiotic Product (PROBIOTIC & ACIDOPHILUS EX ST PO) Take 1 capsule by mouth daily.   No facility-administered encounter medications on file as of 12/21/2014.    Past Medical History  Diagnosis Date  . Atrial fibrillation   . DM2 (diabetes mellitus, type 2)   . HLD (hyperlipidemia)   . HTN (hypertension)   . Hypothyroidism   . Osteoporosis   . Anxiety   . GERD (gastroesophageal reflux disease)     esophageal web  . Granuloma annulare     [name].   . Atrial fibrillation   . Ovarian cancer   . Edema, lower extremity   . Balance disorder   . Incontinence of bowel     Past Surgical History  Procedure Laterality Date  . Femoral hernia repair  1958    R  . Stillbirth  1969  . Laminectomy l4-5  1971  . Total abdominal hysterectomy w/ bilateral salpingoophorectomy  1980    ovarian cancer  . 2nd look laparotomy  1982  . Cholecystectomy  1986  . Kidney stone x2  1991  . Fracture l elbow/wrist  2000  . Intussception/obstruction  12/98  . Wrist fracture surgery  11/05    R  . Cardiac catheterization  8/05    neg; a fib found   . Cataract od  2002  . Myoview stress  8/05    syncope (-)  . Egd/dilation/colon  1/06  . Dexa  9/04 and 1/02  . Cataract surgery  5/07    R  . Abdominal hysterectomy    . Lumbar laminectomy    . Mouth surgery    . Reverse shoulder arthroplasty Left 10/06/2014    Procedure:  REVERSE SHOULDER ARTHROPLASTY;  Surgeon: Corky Mull, MD;  Location: [facility] ORS;  Service: Orthopedics;  Laterality: Left;    Social History  reports that she has never smoked. She has never used smokeless tobacco. She reports that she does not drink alcohol or use illicit drugs.  Family History Family history is unknown by patient.   Review of Systems  Constitutional: Negative.   Eyes: Negative.   Respiratory: Negative.   Cardiovascular: Negative.   Gastrointestinal: Negative.   Musculoskeletal: Positive for myalgias, back pain, arthralgias and gait problem.       Limitation of the left arm range of motion  Neurological: Negative.   Hematological: Negative.   Psychiatric/Behavioral:  Negative.   All other systems reviewed and are negative.   BP 118/64 mmHg  Pulse 75  Ht 5\' 4"  (1.626 m)  Wt 170 lb 4 oz (77.225 kg)  BMI 29.21 kg/m2  Physical Exam  Constitutional: She is oriented to person, place, and time. She appears well-developed and well-nourished.  HENT:  Head: Normocephalic.  Nose: Nose normal.  Mouth/Throat: Oropharynx is clear and moist.  Eyes: Conjunctivae are normal. Pupils are equal, round, and reactive to light.  Neck: Normal range of motion. Neck supple. No JVD present.  Cardiovascular: Normal rate, regular rhythm, S1 normal, S2 normal, normal heart sounds and intact distal pulses.  Exam reveals no gallop and no friction rub.   No murmur heard. Pulmonary/Chest: Effort normal and breath sounds normal. No respiratory distress. She has no wheezes. She has no rales. She exhibits no tenderness.  Abdominal: Soft. Bowel sounds are normal. She exhibits no distension. There is no tenderness.  Musculoskeletal: Normal range of motion. She exhibits no edema or tenderness.  Severely decreased range of motion of her left arm, able to touch her head barely  Lymphadenopathy:    She has no cervical adenopathy.  Neurological: She is alert and oriented to person, place, and time.  Coordination normal.  Skin: Skin is warm and dry. No rash noted. No erythema.  Psychiatric: She has a normal mood and affect. Her behavior is normal. Judgment and thought content normal.    Assessment and Plan  Nursing note and vitals reviewed.

## 2014-12-23 ENCOUNTER — Ambulatory Visit (INDEPENDENT_AMBULATORY_CARE_PROVIDER_SITE_OTHER): Payer: Medicare Other | Admitting: Podiatry

## 2014-12-23 ENCOUNTER — Encounter: Payer: Self-pay | Admitting: Podiatry

## 2014-12-23 VITALS — BP 159/84 | HR 82 | Resp 18

## 2014-12-23 DIAGNOSIS — B351 Tinea unguium: Secondary | ICD-10-CM | POA: Diagnosis not present

## 2014-12-23 DIAGNOSIS — M79676 Pain in unspecified toe(s): Secondary | ICD-10-CM | POA: Diagnosis not present

## 2014-12-23 NOTE — Progress Notes (Signed)
She presents today with chief complaint of painful elongated toenails bilaterally.  Objective: Vital signs are stable alert and oriented 3 pulses are strongly palpable bilateral. Her nails are thick yellow dystrophic onychomycotic painful palpation.  Assessment: Pain in limb secondary to onychomycosis 1 through 5 bilateral.  Plan: Debridement of nails 1 through 5 bilateral covered service secondary to pain. Follow up with her in 3 months

## 2015-01-21 ENCOUNTER — Telehealth: Payer: Self-pay | Admitting: *Deleted

## 2015-01-21 NOTE — Telephone Encounter (Signed)
Notified patient samples available of xarelto 20 mg.

## 2015-01-21 NOTE — Telephone Encounter (Signed)
Pt calling  Patient calling the office for samples of medication:   1.  What medication and dosage are you requesting samples for? Xarelto   2.  Are you currently out of this medication? Not out but will be in a few days   3. Are you requesting samples to get you through until a mail order prescription arrives? Can get a refill but would greatly appreciate some samples.

## 2015-01-27 NOTE — Telephone Encounter (Signed)
Jodi Reeves with [employer] left v/m that pt contacted [employer] and pt would have bone density appt if PCP would order test. Jodi Reeves request cb to see if bone density test has been ordered.

## 2015-01-28 NOTE — Telephone Encounter (Signed)
DEXA ordered just to document status now

## 2015-01-28 NOTE — Addendum Note (Signed)
Addended by: Viviana Simpler I on: 01/28/2015 01:09 PM   Modules accepted: Orders

## 2015-02-15 ENCOUNTER — Encounter: Payer: Self-pay | Admitting: Internal Medicine

## 2015-02-15 ENCOUNTER — Telehealth: Payer: Self-pay | Admitting: Internal Medicine

## 2015-02-15 NOTE — Telephone Encounter (Signed)
Spoke with pt today to see where she would like to go for her bone density.  Pt wanted to know why she needed to do bone density, she stated she was not going to take any medication for this.

## 2015-02-15 NOTE — Telephone Encounter (Signed)
Pt is not refusing to have the bone density.  Please see phone note 11/11/2014, per pt she did not contact [employer] about having a bone density.

## 2015-02-15 NOTE — Telephone Encounter (Signed)
It is recommended anytime someone has a fracture. If she doesn't want it, you need to document that she has refused

## 2015-02-26 ENCOUNTER — Other Ambulatory Visit: Payer: Medicare Other

## 2015-03-05 ENCOUNTER — Encounter: Payer: Medicare Other | Admitting: Internal Medicine

## 2015-03-24 ENCOUNTER — Ambulatory Visit: Payer: Medicare Other | Admitting: Podiatry

## 2015-03-27 ENCOUNTER — Other Ambulatory Visit: Payer: Self-pay | Admitting: Cardiovascular Disease

## 2015-03-29 ENCOUNTER — Telehealth: Payer: Self-pay | Admitting: *Deleted

## 2015-03-29 NOTE — Telephone Encounter (Signed)
Samples placed at front desk for pick up. 

## 2015-03-29 NOTE — Telephone Encounter (Signed)
Pt calling  Patient calling the office for samples of medication:   1. What medication and dosage are you requesting samples for? Xarelto   2. Are you currentlyout of this medication? Not out but will be in a few days   3. Are you requesting samples to get you through until a mail order prescription arrives? Can get a refill but would greatly appreciate some samples She has a few days left.

## 2015-04-27 DIAGNOSIS — R4701 Aphasia: Secondary | ICD-10-CM | POA: Diagnosis not present

## 2015-04-27 DIAGNOSIS — Z8543 Personal history of malignant neoplasm of ovary: Secondary | ICD-10-CM | POA: Diagnosis not present

## 2015-04-27 DIAGNOSIS — G459 Transient cerebral ischemic attack, unspecified: Secondary | ICD-10-CM | POA: Diagnosis not present

## 2015-04-27 DIAGNOSIS — I361 Nonrheumatic tricuspid (valve) insufficiency: Secondary | ICD-10-CM | POA: Diagnosis not present

## 2015-04-27 DIAGNOSIS — I519 Heart disease, unspecified: Secondary | ICD-10-CM | POA: Diagnosis not present

## 2015-04-27 DIAGNOSIS — I517 Cardiomegaly: Secondary | ICD-10-CM | POA: Diagnosis not present

## 2015-04-27 DIAGNOSIS — Z888 Allergy status to other drugs, medicaments and biological substances status: Secondary | ICD-10-CM | POA: Diagnosis not present

## 2015-04-27 DIAGNOSIS — E039 Hypothyroidism, unspecified: Secondary | ICD-10-CM | POA: Diagnosis not present

## 2015-04-27 DIAGNOSIS — Y999 Unspecified external cause status: Secondary | ICD-10-CM | POA: Diagnosis not present

## 2015-04-27 DIAGNOSIS — I10 Essential (primary) hypertension: Secondary | ICD-10-CM | POA: Diagnosis not present

## 2015-04-27 DIAGNOSIS — E119 Type 2 diabetes mellitus without complications: Secondary | ICD-10-CM | POA: Diagnosis not present

## 2015-04-27 DIAGNOSIS — I4891 Unspecified atrial fibrillation: Secondary | ICD-10-CM | POA: Diagnosis not present

## 2015-04-27 DIAGNOSIS — I340 Nonrheumatic mitral (valve) insufficiency: Secondary | ICD-10-CM | POA: Diagnosis not present

## 2015-04-27 DIAGNOSIS — Z794 Long term (current) use of insulin: Secondary | ICD-10-CM | POA: Diagnosis not present

## 2015-04-27 DIAGNOSIS — Z880 Allergy status to penicillin: Secondary | ICD-10-CM | POA: Diagnosis not present

## 2015-04-27 DIAGNOSIS — Z0389 Encounter for observation for other suspected diseases and conditions ruled out: Secondary | ICD-10-CM | POA: Diagnosis not present

## 2015-04-27 DIAGNOSIS — S4992XA Unspecified injury of left shoulder and upper arm, initial encounter: Secondary | ICD-10-CM | POA: Diagnosis not present

## 2015-04-27 DIAGNOSIS — I700 Atherosclerosis of aorta: Secondary | ICD-10-CM | POA: Diagnosis not present

## 2015-04-27 DIAGNOSIS — R479 Unspecified speech disturbances: Secondary | ICD-10-CM | POA: Diagnosis not present

## 2015-04-29 DIAGNOSIS — E119 Type 2 diabetes mellitus without complications: Secondary | ICD-10-CM | POA: Diagnosis not present

## 2015-04-29 DIAGNOSIS — G459 Transient cerebral ischemic attack, unspecified: Secondary | ICD-10-CM | POA: Diagnosis not present

## 2015-04-29 DIAGNOSIS — Z794 Long term (current) use of insulin: Secondary | ICD-10-CM | POA: Diagnosis not present

## 2015-04-29 DIAGNOSIS — I4891 Unspecified atrial fibrillation: Secondary | ICD-10-CM | POA: Diagnosis not present

## 2015-04-29 DIAGNOSIS — I480 Paroxysmal atrial fibrillation: Secondary | ICD-10-CM | POA: Diagnosis not present

## 2015-04-29 DIAGNOSIS — E039 Hypothyroidism, unspecified: Secondary | ICD-10-CM | POA: Diagnosis not present

## 2015-05-18 ENCOUNTER — Telehealth: Payer: Self-pay

## 2015-05-18 NOTE — Telephone Encounter (Signed)
Patient notified samples available of Xarelto 20 mg Lot # [id number]  Exp. 8-19

## 2015-05-18 NOTE — Telephone Encounter (Signed)
Pt would like Xarelto samples.  

## 2015-05-21 DIAGNOSIS — L728 Other follicular cysts of the skin and subcutaneous tissue: Secondary | ICD-10-CM | POA: Diagnosis not present

## 2015-05-21 DIAGNOSIS — Z08 Encounter for follow-up examination after completed treatment for malignant neoplasm: Secondary | ICD-10-CM | POA: Diagnosis not present

## 2015-05-21 DIAGNOSIS — Z85828 Personal history of other malignant neoplasm of skin: Secondary | ICD-10-CM | POA: Diagnosis not present

## 2015-05-28 DIAGNOSIS — N390 Urinary tract infection, site not specified: Secondary | ICD-10-CM | POA: Diagnosis not present

## 2015-05-28 DIAGNOSIS — Z1389 Encounter for screening for other disorder: Secondary | ICD-10-CM | POA: Diagnosis not present

## 2015-06-14 ENCOUNTER — Other Ambulatory Visit: Payer: Self-pay | Admitting: Internal Medicine

## 2015-06-16 ENCOUNTER — Ambulatory Visit: Payer: Self-pay | Admitting: Podiatry

## 2015-06-17 DIAGNOSIS — I480 Paroxysmal atrial fibrillation: Secondary | ICD-10-CM | POA: Diagnosis not present

## 2015-06-17 DIAGNOSIS — Z1389 Encounter for screening for other disorder: Secondary | ICD-10-CM | POA: Diagnosis not present

## 2015-06-17 DIAGNOSIS — R339 Retention of urine, unspecified: Secondary | ICD-10-CM | POA: Diagnosis not present

## 2015-06-17 DIAGNOSIS — R159 Full incontinence of feces: Secondary | ICD-10-CM | POA: Diagnosis not present

## 2015-06-17 DIAGNOSIS — N3941 Urge incontinence: Secondary | ICD-10-CM | POA: Diagnosis not present

## 2015-06-23 DIAGNOSIS — G451 Carotid artery syndrome (hemispheric): Secondary | ICD-10-CM | POA: Diagnosis not present

## 2015-06-24 ENCOUNTER — Other Ambulatory Visit: Payer: Self-pay | Admitting: Internal Medicine

## 2015-06-24 ENCOUNTER — Encounter: Payer: Self-pay | Admitting: Cardiovascular Disease

## 2015-06-24 ENCOUNTER — Ambulatory Visit (INDEPENDENT_AMBULATORY_CARE_PROVIDER_SITE_OTHER): Payer: Medicare HMO | Admitting: Cardiovascular Disease

## 2015-06-24 VITALS — BP 132/72 | HR 70 | Ht 63.0 in | Wt 174.5 lb

## 2015-06-24 DIAGNOSIS — R2681 Unsteadiness on feet: Secondary | ICD-10-CM

## 2015-06-24 DIAGNOSIS — E1149 Type 2 diabetes mellitus with other diabetic neurological complication: Secondary | ICD-10-CM | POA: Diagnosis not present

## 2015-06-24 DIAGNOSIS — I10 Essential (primary) hypertension: Secondary | ICD-10-CM | POA: Diagnosis not present

## 2015-06-24 DIAGNOSIS — R69 Illness, unspecified: Secondary | ICD-10-CM | POA: Diagnosis not present

## 2015-06-24 DIAGNOSIS — I481 Persistent atrial fibrillation: Secondary | ICD-10-CM

## 2015-06-24 DIAGNOSIS — I5032 Chronic diastolic (congestive) heart failure: Secondary | ICD-10-CM | POA: Diagnosis not present

## 2015-06-24 DIAGNOSIS — S42293D Other displaced fracture of upper end of unspecified humerus, subsequent encounter for fracture with routine healing: Secondary | ICD-10-CM

## 2015-06-24 DIAGNOSIS — G459 Transient cerebral ischemic attack, unspecified: Secondary | ICD-10-CM | POA: Insufficient documentation

## 2015-06-24 DIAGNOSIS — I4819 Other persistent atrial fibrillation: Secondary | ICD-10-CM

## 2015-06-24 NOTE — Assessment & Plan Note (Signed)
Long discussion concerning the rehabilitation required for her left shoulder.  She has completed PT , graduated.  Now recommended that she try to become more active , strengthen her legs

## 2015-06-24 NOTE — Assessment & Plan Note (Signed)
Persistent atrial fibrillation, adequate rate control  Encouraged her to stay on her xarelto.  If she does have any further TIA symptoms, would change to eliquis 5 mg twice a day

## 2015-06-24 NOTE — Patient Instructions (Signed)
You are doing well. No medication changes were made.  If you have more symptoms concerning for baby stroke/TIA, Call the office right away  Please call us if you have new issues that need to be addressed before your next appt.  Your physician wants you to follow-up in: 6 months.  You will receive a reminder letter in the mail two months in advance. If you don't receive a letter, please call our office to schedule the follow-up appointment.

## 2015-06-24 NOTE — Assessment & Plan Note (Signed)
Stressed importance of restarting exercise program, watching her carbohydrates

## 2015-06-24 NOTE — Progress Notes (Signed)
Patient ID: Jodi Reeves, female    DOB: 1928/04/14, 82 y.o.   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 No intervention was needed.  She presents for follow-up of her atrial fibrillation  In follow-up today, she reports that she had a TIA in January 2017, went to [facility] for evaluation She was talking with a friend, had word finding difficulty, friend was finishing her sentences Workup at [facility] reviewed She had CT scan of her head which was essentially normal,  carotid ultrasound that showed mild bilateral disease, no significant stenoses,  echocardiogram with ejection fraction greater than 65%, mild valve abnormality  She denies any medication noncompliance with her anticoagulation Currently she feels well No regular physical activity, no longer working out  Lab work reviewed with her showing total cholesterol 201, LDL 107, hemoglobin A1c 6.9  EKG on today's visit showing atrial fibrillation with ventricular rate 70 bpm, no significant ST or T-wave changes  Other past medical history Diabetes managed by Dr. 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   She had amiodarone many years ago and she believes that this caused her thyroid problem.     Allergies  Allergen Reactions  . Alendronate Sodium     REACTION: Rash  . Atorvastatin     REACTION: Muscle aches  . Codeine     REACTION: N \\T \ V  . Ezetimibe-Simvastatin     REACTION: Muscle aches  . Lovastatin     REACTION: Myalgias  . Penicillins     REACTION: Anaphylaxis  . Rosiglitazone Maleate     REACTION: Edema    Outpatient Encounter Prescriptions as of 06/24/2015  Medication Sig  . Ascorbic Acid (VITAMIN C) 1000 MG tablet Take 1,000 mg by mouth daily.    . BD PEN NEEDLE NANO U/F 32G X 4 MM MISC Inject 1 Syringe as directed as needed.   . bisoprolol-hydrochlorothiazide (ZIAC) 2.5-6.25 MG per tablet Take 1 tablet by mouth daily.  Marland Kitchen CALCIUM & MAGNESIUM CARBONATES PO Take 1 capsule by mouth daily.   . Cholecalciferol (VITAMIN D3) 2000 UNITS TABS Take by mouth daily.  . digoxin (LANOXIN) 0.125 MG tablet TAKE ONE (1) TABLET EACH DAY  . fluorometholone (FML) 0.1 % ophthalmic suspension Place 1 drop into both eyes as needed.   . Folic Acid-Vit Q000111Q 123456 (FA-VITAMIN B-6-VITAMIN B-12) 2.2-25-0.5 MG TABS Place under the tongue daily.    . furosemide (LASIX) 20 MG tablet Take 1 tablet (20 mg total) by mouth 2 (two) times daily as needed.  Marland Kitchen glipiZIDE (GLUCOTROL XL) 2.5 MG 24 hr tablet Take 2.5 mg by mouth daily with breakfast.  . glucose blood (ONE TOUCH ULTRA TEST) test strip Use to test blood sugar once daily E11.49  . insulin NPH Human (HUMULIN N,NOVOLIN N) 100 UNIT/ML injection Inject 16  Units into the skin at bedtime.   Marland Kitchen levothyroxine (SYNTHROID, LEVOTHROID) 50 MCG tablet Take 50 mcg by mouth daily before breakfast.  . metFORMIN (GLUCOPHAGE-XR) 500 MG 24 hr tablet Take two tablet by mouth in the morning and two tablets by mouth in the evening   . Multiple Vitamins-Minerals (CENTRUM ADULTS PO) Take 1 tablet by mouth daily.  . ondansetron (ZOFRAN) 4 MG tablet Take 1 tablet (4 mg total) by mouth daily as needed for nausea or vomiting.  . polyethylene glycol powder (MIRALAX)  powder Take 17 g by mouth as needed.   . potassium chloride (K-DUR) 10 MEQ tablet Take 1 tablet (10 mEq total) by mouth daily as needed.  Alveda Reasons 20 MG TABS tablet TAKE ONE (1) TABLET EACH DAY   No facility-administered encounter medications on file as of 06/24/2015.    Past Medical History  Diagnosis Date  . Atrial fibrillation ([place])   . DM2 (diabetes mellitus, type 2) ([place])   . HLD (hyperlipidemia)   . HTN (hypertension)   . Hypothyroidism   . Osteoporosis   . Anxiety   . GERD (gastroesophageal reflux disease)     esophageal web  . Granuloma annulare     [name].   . Atrial fibrillation ([place])   . Ovarian cancer ([place])   . Edema, lower extremity   . Balance disorder   . Incontinence of bowel   . TIA (transient ischemic attack)     Past Surgical History  Procedure Laterality Date  . Femoral hernia repair  1958    R  . Stillbirth  1969  . Laminectomy l4-5  1971  . Total abdominal hysterectomy w/ bilateral salpingoophorectomy  1980    ovarian cancer  . 2nd look laparotomy  1982  . Cholecystectomy  1986  . Kidney stone x2  1991  . Fracture l elbow/wrist  2000  . Intussception/obstruction  12/98  . Wrist fracture surgery  11/05    R  . Cardiac catheterization  8/05    neg; a fib found   . Cataract od  2002  . Myoview stress  8/05    syncope (-)  . Egd/dilation/colon  1/06  . Dexa  9/04 and 1/02  . Cataract surgery  5/07    R  . Abdominal hysterectomy    . Lumbar laminectomy    . Mouth surgery    . Reverse shoulder arthroplasty Left 10/06/2014    Procedure: REVERSE SHOULDER ARTHROPLASTY;  Surgeon: Corky Mull, MD;  Location: [facility] ORS;  Service: Orthopedics;  Laterality: Left;    Social History  reports that she has never smoked. She has never used smokeless tobacco. She reports that she does not drink alcohol or use illicit drugs.  Family History Family history is unknown by patient.   Review of Systems  Constitutional: Negative.   Eyes: Negative.    Respiratory: Negative.   Cardiovascular: Negative.   Gastrointestinal: Negative.   Musculoskeletal: Positive for myalgias, back pain, arthralgias and gait problem.       Limitation of the left arm range of motion  Neurological: Negative.   Hematological: Negative.   Psychiatric/Behavioral: Negative.   All other systems reviewed and are negative.   BP 132/72 mmHg  Pulse 70  Ht 5\' 3"  (1.6 m)  Wt 174 lb 8 oz (79.153 kg)  BMI 30.92 kg/m2  Physical Exam  Constitutional: She is oriented to person, place, and time. She appears well-developed and well-nourished.  HENT:  Head: Normocephalic.  Nose: Nose  normal.  Mouth/Throat: Oropharynx is clear and moist.  Eyes: Conjunctivae are normal. Pupils are equal, round, and reactive to light.  Neck: Normal range of motion. Neck supple. No JVD present.  Cardiovascular: Normal rate, regular rhythm, S1 normal, S2 normal, normal heart sounds and intact distal pulses.  Exam reveals no gallop and no friction rub.   No murmur heard. Pulmonary/Chest: Effort normal and breath sounds normal. No respiratory distress. She has no wheezes. She has no rales. She exhibits no tenderness.  Abdominal: Soft. Bowel sounds are normal. She exhibits no distension. There is no tenderness.  Musculoskeletal: Normal range of motion. She exhibits no edema or tenderness.  Severely decreased range of motion of her left arm, able to touch her head barely  Lymphadenopathy:    She has no cervical adenopathy.  Neurological: She is alert and oriented to person, place, and time. Coordination normal.  Skin: Skin is warm and dry. No rash noted. No erythema.  Psychiatric: She has a normal mood and affect. Her behavior is normal. Judgment and thought content normal.    Assessment and Plan  Nursing note and vitals reviewed.

## 2015-06-24 NOTE — Assessment & Plan Note (Signed)
Possible TIA January 2017 with word finding difficulty Evaluated at [facility] at the time Suggested she monitor her symptoms for now and if she has recurrent symptoms, could consider starting eliquis instead of xarelto. Also she has established care with neurology. Need to rule out underlying neurologic issues such as early memory loss

## 2015-06-24 NOTE — Assessment & Plan Note (Signed)
She continues to take Lasix as needed for leg swelling  Appears relatively euvolemic on today's visit

## 2015-06-24 NOTE — Assessment & Plan Note (Signed)
I'm concerned about her gait instability, leg weakness which has been progressive..  Strongly recommended she try to regain a active lifestyle

## 2015-06-24 NOTE — Assessment & Plan Note (Signed)
Blood pressure is well controlled on today's visit. No changes made to the medications. 

## 2015-06-28 ENCOUNTER — Encounter: Payer: Self-pay | Admitting: Podiatry

## 2015-06-28 ENCOUNTER — Ambulatory Visit (INDEPENDENT_AMBULATORY_CARE_PROVIDER_SITE_OTHER): Payer: Medicare HMO | Admitting: Podiatry

## 2015-06-28 DIAGNOSIS — M79676 Pain in unspecified toe(s): Secondary | ICD-10-CM | POA: Diagnosis not present

## 2015-06-28 DIAGNOSIS — B351 Tinea unguium: Secondary | ICD-10-CM | POA: Diagnosis not present

## 2015-06-28 NOTE — Progress Notes (Signed)
She presents today with a chief complaint of painful elongated toenails.  Objective: Vital signs are stable alert and oriented 3. Pulses are strongly palpable. Toenails are thick yellow dystrophic onychomycotic and painful palpation.  Assessment: Pain in limb secondary to onychomycosis 1 through 5 bilateral.  Plan: Discussed etiology pathology conservative versus surgical therapies. Debrided toenails 1 through 5 bilateral cover service secondary to pain.

## 2015-07-01 DIAGNOSIS — R300 Dysuria: Secondary | ICD-10-CM | POA: Diagnosis not present

## 2015-07-01 DIAGNOSIS — Z1389 Encounter for screening for other disorder: Secondary | ICD-10-CM | POA: Diagnosis not present

## 2015-07-30 ENCOUNTER — Encounter: Payer: Medicare Other | Admitting: Internal Medicine

## 2015-08-10 ENCOUNTER — Telehealth: Payer: Self-pay | Admitting: Cardiovascular Disease

## 2015-08-10 NOTE — Telephone Encounter (Signed)
Patient called and needs samples of XARELTO 20 MG TABS tablet. Please call her when they are ready for pick up. Thanks!

## 2015-08-10 NOTE — Telephone Encounter (Signed)
Placed samples of xarelto at front desk for pick up.

## 2015-08-30 ENCOUNTER — Other Ambulatory Visit: Payer: Self-pay | Admitting: Cardiovascular Disease

## 2015-08-30 DIAGNOSIS — E113293 Type 2 diabetes mellitus with mild nonproliferative diabetic retinopathy without macular edema, bilateral: Secondary | ICD-10-CM | POA: Diagnosis not present

## 2015-09-02 DIAGNOSIS — E039 Hypothyroidism, unspecified: Secondary | ICD-10-CM | POA: Diagnosis not present

## 2015-09-02 DIAGNOSIS — Z794 Long term (current) use of insulin: Secondary | ICD-10-CM | POA: Diagnosis not present

## 2015-09-02 DIAGNOSIS — E11649 Type 2 diabetes mellitus with hypoglycemia without coma: Secondary | ICD-10-CM | POA: Diagnosis not present

## 2015-09-17 ENCOUNTER — Ambulatory Visit (INDEPENDENT_AMBULATORY_CARE_PROVIDER_SITE_OTHER): Payer: Medicare HMO

## 2015-09-17 ENCOUNTER — Encounter: Payer: Self-pay | Admitting: Emergency Medicine

## 2015-09-17 ENCOUNTER — Ambulatory Visit
Admission: EM | Admit: 2015-09-17 | Discharge: 2015-09-17 | Disposition: A | Discharge status: Home / Self Care | Payer: Medicare HMO | Attending: Internal Medicine | Admitting: Internal Medicine

## 2015-09-17 DIAGNOSIS — R809 Proteinuria, unspecified: Secondary | ICD-10-CM | POA: Diagnosis not present

## 2015-09-17 DIAGNOSIS — E11649 Type 2 diabetes mellitus with hypoglycemia without coma: Secondary | ICD-10-CM | POA: Diagnosis not present

## 2015-09-17 DIAGNOSIS — S2231XA Fracture of one rib, right side, initial encounter for closed fracture: Secondary | ICD-10-CM | POA: Diagnosis not present

## 2015-09-17 DIAGNOSIS — I10 Essential (primary) hypertension: Secondary | ICD-10-CM | POA: Diagnosis not present

## 2015-09-17 DIAGNOSIS — E1129 Type 2 diabetes mellitus with other diabetic kidney complication: Secondary | ICD-10-CM | POA: Diagnosis not present

## 2015-09-17 DIAGNOSIS — Z794 Long term (current) use of insulin: Secondary | ICD-10-CM | POA: Diagnosis not present

## 2015-09-17 DIAGNOSIS — E039 Hypothyroidism, unspecified: Secondary | ICD-10-CM | POA: Diagnosis not present

## 2015-09-17 MED ORDER — NAPROXEN 375 MG PO TABS
375.0000 mg | ORAL_TABLET | Freq: Two times a day (BID) | ORAL | Status: AC
Start: 2015-09-17 — End: 2015-10-01

## 2015-09-17 NOTE — Discharge Instructions (Signed)
Prescription for naproxyn sent to the [facility].   Recheck for increased breathlessness, cough, unrelenting pain, or new fever >100.5.    Rib Fracture A rib fracture is a break or crack in one of the bones of the ribs. The ribs are like a cage that goes around your upper chest. A broken or cracked rib is often painful, but most do not cause other problems. Most rib fractures heal on their own in 1-3 months. HOME CARE  Avoid activities that cause pain to the injured area. Protect your injured area.  Slowly increase activity as told by your doctor.  Take medicine as told by your doctor.  Put ice on the injured area for the first 1-2 days after you have been treated or as told by your doctor.  Put ice in a plastic bag.  Place a towel between your skin and the bag.  Leave the ice on for 15-20 minutes at a time, every 2 hours while you are awake.  Do deep breathing as told by your doctor. You may be told to:  Take deep breaths many times a day.  Cough many times a day while hugging a pillow.  Use a device (incentive spirometer) to perform deep breathing many times a day.  Drink enough fluids to keep your pee (urine) clear or pale yellow.   Do not wear a rib belt or binder. These do not allow you to breathe deeply. GET HELP RIGHT AWAY IF:   You have a fever.  You have trouble breathing.   You cannot stop coughing.  You cough up thick or bloody spit (mucus).   You feel sick to your stomach (nauseous), throw up (vomit), or have belly (abdominal) pain.   Your pain gets worse and medicine does not help.  MAKE SURE YOU:   Understand these instructions.  Will watch your condition.  Will get help right away if you are not doing well or get worse.   This information is not intended to replace advice given to you by your health care provider. Make sure you discuss any questions you have with your health care provider.   Document Released: 01/18/2008 Document  Revised: 08/05/2012 Document Reviewed: 06/12/2012 Elsevier Interactive Patient Education [employer].

## 2015-09-17 NOTE — ED Notes (Signed)
Patient states that she fell outside while trying to kill a snake on Monday.  Patient states that when she fell she landed on the ground on her right side.  Patient c/o of right sided rib pain especially when she takes a deep breath and when she moves.

## 2015-09-17 NOTE — ED Provider Notes (Signed)
CSN: [id number]     Arrival date & time 09/17/15  1202 History   First MD Initiated Contact with Patient 09/17/15 1248     Chief Complaint  Patient presents with  . Fall  . Rib Pain    HPI  82 year old lady who was in the yard on 5/22 and killed a snake with a hoe. Had both hands on the hoe, nothing to hold onto her cane with, and fell forward.  Pain in the right chest/breast, not too severe for a couple of days, but then did some laundry, washed bed linens, cleaned out desk drawers, now is having  significant pain in the right chest that sometimes radiates around to the back. Worse with deep breath, no bruise. No cough, no dyspnea. No fever. Has taken Tylenol and Naprosyn 2 or 3 times. Has tramadol at home, from her shoulder fracture, but has not used it. Past Medical History  Diagnosis Date  . Atrial fibrillation ([place])   . DM2 (diabetes mellitus, type 2) ([place])   . HLD (hyperlipidemia)   . HTN (hypertension)   . Hypothyroidism   . Osteoporosis   . Anxiety   . GERD (gastroesophageal reflux disease)     esophageal web  . Granuloma annulare     [name].   . Atrial fibrillation ([place])   . Ovarian cancer ([place])   . Edema, lower extremity   . Balance disorder   . Incontinence of bowel   . TIA (transient ischemic attack)    Past Surgical History  Procedure Laterality Date  . Femoral hernia repair  1958    R  . Stillbirth  1969  . Laminectomy l4-5  1971  . Total abdominal hysterectomy w/ bilateral salpingoophorectomy  1980    ovarian cancer  . 2nd look laparotomy  1982  . Cholecystectomy  1986  . Kidney stone x2  1991  . Fracture l elbow/wrist  2000  . Intussception/obstruction  12/98  . Wrist fracture surgery  11/05    R  . Cardiac catheterization  8/05    neg; a fib found   . Cataract od  2002  . Myoview stress  8/05    syncope (-)  . Egd/dilation/colon  1/06  . Dexa  9/04 and 1/02  . Cataract surgery  5/07    R  . Abdominal hysterectomy    . Lumbar laminectomy     . Mouth surgery    . Reverse shoulder arthroplasty Left 10/06/2014    Procedure: REVERSE SHOULDER ARTHROPLASTY;  Surgeon: Corky Mull, MD;  Location: [facility] ORS;  Service: Orthopedics;  Laterality: Left;   Family History  Problem Relation Age of Onset  . Family history unknown: Yes   Social History  Substance Use Topics  . Smoking status: Never Smoker   . Smokeless tobacco: Never Used  . Alcohol Use: No     Comment: wine occ.    Review of Systems  All other systems reviewed and are negative.   Allergies  Alendronate sodium; Atorvastatin; Codeine; Ezetimibe-simvastatin; Lovastatin; Penicillins; and Rosiglitazone maleate  Home Medications   Prior to Admission medications   Medication Sig Start Date End Date Taking? Authorizing Provider  Ascorbic Acid (VITAMIN C) 1000 MG tablet Take 1,000 mg by mouth daily.      Historical Provider, MD  BD PEN NEEDLE NANO U/F 32G X 4 MM MISC Inject 1 Syringe as directed as needed.  04/28/11   Historical Provider, MD  bisoprolol-hydrochlorothiazide (ZIAC) 2.5-6.25 MG per tablet Take 1  tablet by mouth daily. 12/15/14 12/15/15  Venia Carbon, MD  CALCIUM & MAGNESIUM CARBONATES PO Take 1 capsule by mouth daily.     Historical Provider, MD  Cholecalciferol (VITAMIN D3) 2000 UNITS TABS Take by mouth daily.    Historical Provider, MD  digoxin (LANOXIN) 0.125 MG tablet TAKE ONE (1) TABLET EACH DAY 06/14/15   Venia Carbon, MD  fluorometholone (FML) 0.1 % ophthalmic suspension Place 1 drop into both eyes as needed.  01/23/13   Historical Provider, MD  Folic Acid-Vit Q000111Q 123456 (FA-VITAMIN B-6-VITAMIN B-12) 2.2-25-0.5 MG TABS Place under the tongue daily.      Historical Provider, MD  furosemide (LASIX) 20 MG tablet Take 1 tablet (20 mg total) by mouth 2 (two) times daily as needed. 06/11/14   Minna Merritts, MD  furosemide (LASIX) 20 MG tablet TAKE ONE TABLET BY MOUTH TWICE DAILY AS NEEDED 08/30/15   Minna Merritts, MD  glipiZIDE (GLUCOTROL XL) 2.5 MG 24  hr tablet Take 2.5 mg by mouth daily with breakfast.    Historical Provider, MD  glucose blood (ONE TOUCH ULTRA TEST) test strip Use to test blood sugar once daily E11.49 06/24/15   [name], MD  insulin NPH Human (HUMULIN N,NOVOLIN N) 100 UNIT/ML injection Inject 16 Units into the skin at bedtime.     Historical Provider, MD  levothyroxine (SYNTHROID, LEVOTHROID) 50 MCG tablet Take 50 mcg by mouth daily before breakfast.    Historical Provider, MD  metFORMIN (GLUCOPHAGE-XR) 500 MG 24 hr tablet Take two tablet by mouth in the morning and two tablets by mouth in the evening  11/30/10   Venia Carbon, MD  polyethylene glycol powder (MIRALAX) powder Take 17 g by mouth as needed.     Historical Provider, MD  potassium chloride (K-DUR) 10 MEQ tablet Take 1 tablet (10 mEq total) by mouth daily as needed. 12/21/14   Minna Merritts, MD  XARELTO 20 MG TABS tablet TAKE ONE (1) TABLET EACH DAY 03/29/15   Minna Merritts, MD      BP 146/81 mmHg  Pulse 66  Temp(Src) 97.8 F (36.6 C) (Tympanic)  Resp 16  Ht 5\' 4"  (1.626 m)  Wt 174 lb (78.926 kg)  BMI 29.85 kg/m2  SpO2 98%  Physical Exam  Constitutional: She is oriented to person, place, and time. No distress.  Alert, nicely groomed Walked in with cane, able to rise from chair independently  HENT:  Head: Atraumatic.  Eyes:  Conjugate gaze, no eye redness/drainage  Neck: Neck supple.  Cardiovascular: Normal rate.   Irregularly irregular rhythm, has history of a fib  Pulmonary/Chest: No respiratory distress.    Lungs clear, symmetric breath sounds Hurts to take a deep breath. Tender to palpation as marked on the diagram, no bruise visible  Abdominal: She exhibits no distension.  Musculoskeletal: Normal range of motion.  No leg swelling  Neurological: She is alert and oriented to person, place, and time.  Skin: Skin is warm and dry.  No cyanosis  Nursing note and vitals reviewed.   ED Course  Procedures (including critical care  time)  Imaging Review Dg Ribs Unilateral W/chest Right  09/17/2015  CLINICAL DATA:  Fall yesterday with right-sided pain around the breast. Initial encounter. EXAM: RIGHT RIBS AND CHEST - 3+ VIEW COMPARISON:  None. FINDINGS: Nondisplaced lateral right fourth rib fracture which appears acute. Other subtle rib cortical undulation is not convincing for acute fracture. No hemothorax or pneumothorax. Cardiomegaly. Hyperinflation. IMPRESSION: 1.  Nondisplaced right fourth rib fracture. 2. No pneumothorax or pleural fluid. Electronically Signed   By: Monte Fantasia M.D.   On: 09/17/2015 13:00    MDM   1. Right rib fracture, closed, initial encounter    Meds ordered this encounter  Medications  . naproxen (NAPROSYN) 375 MG tablet    Sig: Take 1 tablet (375 mg total) by mouth 2 (two) times daily.    Dispense:  30 tablet    Refill:  0   Has tramadol at home, okay to take this if needed. Recheck for increased dyspnea, cough, unrelenting pain, new fever greater than 100.5.  Anticipate gradual improvement over the next 4-6 weeks. Activity as tolerated    Sherlene Shams, MD 09/20/15 [phone]

## 2015-09-21 ENCOUNTER — Telehealth: Payer: Self-pay | Admitting: Cardiovascular Disease

## 2015-09-21 NOTE — Telephone Encounter (Signed)
Patient aware samples Xarelto 20 mg LOT# [id number] Exp. 8-19 of 5 bottles.

## 2015-09-21 NOTE — Telephone Encounter (Signed)
Pt would like Xarelto samples. Please call.  

## 2015-09-29 ENCOUNTER — Ambulatory Visit: Payer: Medicare HMO | Admitting: Podiatry

## 2015-10-22 DIAGNOSIS — R69 Illness, unspecified: Secondary | ICD-10-CM | POA: Diagnosis not present

## 2015-10-22 DIAGNOSIS — R609 Edema, unspecified: Secondary | ICD-10-CM | POA: Diagnosis not present

## 2015-10-22 DIAGNOSIS — Z954 Presence of other heart-valve replacement: Secondary | ICD-10-CM | POA: Diagnosis not present

## 2015-11-10 ENCOUNTER — Ambulatory Visit: Payer: Medicare HMO | Admitting: Podiatry

## 2015-11-16 ENCOUNTER — Other Ambulatory Visit: Payer: Self-pay | Admitting: Cardiovascular Disease

## 2015-11-22 ENCOUNTER — Ambulatory Visit: Payer: Medicare HMO | Admitting: Podiatry

## 2015-11-23 ENCOUNTER — Telehealth: Payer: Self-pay | Admitting: Cardiovascular Disease

## 2015-11-23 NOTE — Telephone Encounter (Signed)
Notified patient samples available to pick up of xarelto 20 mg Lot# [id number] Exp. 9-19

## 2015-11-23 NOTE — Telephone Encounter (Signed)
Patient calling the office for samples of medication:   1.  What medication and dosage are you requesting samples for? xarelto  2.  Are you currently out of this medication?  Has only just enough for this week.

## 2015-11-29 ENCOUNTER — Ambulatory Visit (INDEPENDENT_AMBULATORY_CARE_PROVIDER_SITE_OTHER): Payer: Medicare HMO | Admitting: Podiatry

## 2015-11-29 ENCOUNTER — Encounter: Payer: Self-pay | Admitting: Podiatry

## 2015-11-29 DIAGNOSIS — B351 Tinea unguium: Secondary | ICD-10-CM | POA: Diagnosis not present

## 2015-11-29 DIAGNOSIS — M79676 Pain in unspecified toe(s): Secondary | ICD-10-CM

## 2015-11-29 NOTE — Progress Notes (Signed)
She presents today with chief complaint painful elongated toenails 1 through 5 bilateral.  Objective: Vital signs stable [place] 3 tenderness particularly dystrophic painful palpation.  Assessment: Pain in limb secondary to nail dystrophy and short radial nail margins.  Plan: Debridement of toenails 1 through 5 bilateral.

## 2015-12-07 ENCOUNTER — Telehealth: Payer: Self-pay | Admitting: Cardiovascular Disease

## 2015-12-07 DIAGNOSIS — R609 Edema, unspecified: Secondary | ICD-10-CM | POA: Diagnosis not present

## 2015-12-07 DIAGNOSIS — Z954 Presence of other heart-valve replacement: Secondary | ICD-10-CM | POA: Diagnosis not present

## 2015-12-07 DIAGNOSIS — R69 Illness, unspecified: Secondary | ICD-10-CM | POA: Diagnosis not present

## 2015-12-07 NOTE — Telephone Encounter (Signed)
Pt is calling statingThe xarelto she taking is passing through into her stool Is concerned its not working on her. Is seeing the shell come through  Please advise.

## 2015-12-07 NOTE — Telephone Encounter (Signed)
Spoke w/ pt.  Advised her that I found a study in the [employer], [employer] that reports case studies on "ghost pill phenomenon" that reports findings that some pills have insoluble parts that are excreted in feces, particularly ER pills. She states that her pharmacist told her something similar, but she wanted to call to make sure. She is appreciative of the call and will keep her appt on 8/29.

## 2015-12-11 ENCOUNTER — Other Ambulatory Visit: Payer: Self-pay | Admitting: Internal Medicine

## 2015-12-21 ENCOUNTER — Ambulatory Visit (INDEPENDENT_AMBULATORY_CARE_PROVIDER_SITE_OTHER): Payer: Medicare HMO | Admitting: Cardiovascular Disease

## 2015-12-21 ENCOUNTER — Encounter: Payer: Self-pay | Admitting: Cardiovascular Disease

## 2015-12-21 VITALS — BP 154/72 | HR 73 | Ht 63.0 in | Wt 173.0 lb

## 2015-12-21 DIAGNOSIS — E785 Hyperlipidemia, unspecified: Secondary | ICD-10-CM

## 2015-12-21 DIAGNOSIS — E1149 Type 2 diabetes mellitus with other diabetic neurological complication: Secondary | ICD-10-CM

## 2015-12-21 DIAGNOSIS — I481 Persistent atrial fibrillation: Secondary | ICD-10-CM

## 2015-12-21 DIAGNOSIS — I5032 Chronic diastolic (congestive) heart failure: Secondary | ICD-10-CM | POA: Diagnosis not present

## 2015-12-21 DIAGNOSIS — I10 Essential (primary) hypertension: Secondary | ICD-10-CM | POA: Diagnosis not present

## 2015-12-21 DIAGNOSIS — I4819 Other persistent atrial fibrillation: Secondary | ICD-10-CM

## 2015-12-21 DIAGNOSIS — G459 Transient cerebral ischemic attack, unspecified: Secondary | ICD-10-CM

## 2015-12-21 DIAGNOSIS — R600 Localized edema: Secondary | ICD-10-CM

## 2015-12-21 DIAGNOSIS — R0602 Shortness of breath: Secondary | ICD-10-CM

## 2015-12-21 MED ORDER — BISOPROLOL-HYDROCHLOROTHIAZIDE 2.5-6.25 MG PO TABS: 1.0000 | ORAL_TABLET | Freq: Every day | ORAL | 3 refills | Status: DC

## 2015-12-21 NOTE — Progress Notes (Signed)
Cardiology Office Note  Date:  12/21/2015   ID:  Jodi Reeves, DOB 11-Dec-1927, MRN [id number]  PCP:  Jodi Chick, MD   Chief Complaint  Patient presents with  . Other    6 month f/u c/o edema ankles/feet. Meds reviewed verbally with pt.    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 No intervention was needed.  She presents for follow-up of her atrial fibrillation  In follow-up today, she reports that she feels well from a cardiac perspective Heart rate relatively well-controlled, chronic atrial fibrillation, tolerating anticoagulation Recently ran out of her blood pressure medication bisoprolol HCT Blood pressure running little but higher than normal  She does report that she is having Difficultly swallowing, Did a stretching last year with Jodi Reeves to have Diarrhea , comes and goes, still bothersome, interrupting her lifestyle Not going out much secondary to dairrhea. Does not want to be caught off guard  Bp runs better at home, typically [id number] systolic Lab work reviewed with her HA1C 7.1, TSH 2  EKG on today's visit shows atrial fibrillation with ventricular rate 73 bpm, nonspecific ST abnormality, poor R-wave progression through the anterior precordial leads  Other past medical history reviewed TIA in January 2017, went to [facility] for evaluation She was talking with a friend, had word finding difficulty, friend was finishing her sentences Workup at [facility] reviewed She had CT scan of her head which was essentially normal,  carotid ultrasound that showed mild bilateral disease, no significant stenoses,  echocardiogram with ejection fraction greater than 65%, mild valve abnormality  Previous total cholesterol 201, LDL 107, hemoglobin  A1c 6.9  Diabetes managed by Jodi Reeves Balance continues to be very poor, walks with a cane Declined a cholesterol pill in the past   Previous mechanical fall at church while she was hanging up her coat in 2015, She fell backwards, hit her head, was transferred to [facility] While in the emergency room she had chest pain, had head CT scan, MRI, cardiac enzymes which were normal 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   She had amiodarone many years ago and she believes that this caused her thyroid problem.    PMH:   has a past medical history of Anxiety; Atrial fibrillation ([place]); Atrial fibrillation ([place]); Balance disorder; DM2 (diabetes mellitus, type 2) ([place]); Edema, lower extremity; GERD (gastroesophageal reflux disease); Granuloma annulare; HLD (hyperlipidemia); HTN (hypertension); Hypothyroidism; Incontinence of bowel; Osteoporosis; Ovarian cancer ([place]); and TIA (transient ischemic attack).  PSH:    Past Surgical History:  Procedure Laterality Date  . 2nd look laparotomy  1982  . ABDOMINAL HYSTERECTOMY    . CARDIAC CATHETERIZATION  8/05   neg; a fib found   . cataract OD  2002  . cataract surgery  5/07   R  . CHOLECYSTECTOMY  1986  . DEXA  9/04 and 1/02  . EGD/dilation/colon  1/06  . FEMORAL HERNIA REPAIR  1958   R  . fracture L elbow/wrist  2000  . intussception/obstruction  12/98  . kidney stone x2  1991  . laminectomy L4-5  1971  . LUMBAR LAMINECTOMY    . MOUTH SURGERY    .  myoview stress  8/05   syncope (-)  . REVERSE SHOULDER ARTHROPLASTY Left 10/06/2014   Procedure: REVERSE SHOULDER ARTHROPLASTY;  Surgeon: Jodi Mull, MD;  Location: [facility] ORS;  Service: Orthopedics;  Laterality: Left;  . stillbirth  1969  . TOTAL ABDOMINAL HYSTERECTOMY W/ BILATERAL SALPINGOOPHORECTOMY  1980   ovarian cancer  .  WRIST FRACTURE SURGERY  11/05   R    Current Outpatient Prescriptions  Medication Sig Dispense Refill  . Ascorbic Acid (VITAMIN C) 1000 MG tablet Take 1,000 mg by mouth daily.      . BD PEN NEEDLE NANO U/F 32G X 4 MM MISC Inject 1 Syringe as directed as needed.     . bisoprolol-hydrochlorothiazide (ZIAC) 2.5-6.25 MG tablet Take 1 tablet by mouth daily. 90 tablet 3  . CALCIUM & MAGNESIUM CARBONATES PO Take 1 capsule by mouth daily.     . Cholecalciferol (VITAMIN D3) 2000 UNITS TABS Take by mouth daily.    . digoxin (LANOXIN) 0.125 MG tablet TAKE ONE (1) TABLET EACH DAY 90 tablet 3  . fluorometholone (FML) 0.1 % ophthalmic suspension Place 1 drop into both eyes as needed.     . Folic Acid-Vit Q000111Q 123456 (FA-VITAMIN B-6-VITAMIN B-12) 2.2-25-0.5 MG TABS Place under the tongue daily.      . furosemide (LASIX) 20 MG tablet Take 1 tablet (20 mg total) by mouth 2 (two) times daily as needed. 180 tablet 3  . glipiZIDE (GLUCOTROL XL) 2.5 MG 24 hr tablet Take 2.5 mg by mouth daily with breakfast.    . glucose blood (ONE TOUCH ULTRA TEST) test strip Use to test blood sugar once daily E11.49 100 each 1  . insulin NPH Human (HUMULIN N,NOVOLIN N) 100 UNIT/ML injection Inject 16 Units into the skin at bedtime.     Marland Kitchen levothyroxine (SYNTHROID, LEVOTHROID) 50 MCG tablet Take 50 mcg by mouth daily before breakfast.    . metFORMIN (GLUCOPHAGE-XR) 500 MG 24 hr tablet Take two tablet by mouth in the morning and 1 tablet by mouth in the evening    . Multiple Vitamin (MULTIVITAMIN) capsule Take 1 capsule by mouth daily.    . polyethylene glycol powder (MIRALAX) powder Take 17 g by mouth as needed.     . potassium chloride (K-DUR) 10 MEQ tablet Take 1 tablet (10 mEq total) by mouth daily as needed. 30 tablet 6  . XARELTO 20 MG TABS tablet TAKE ONE (1) TABLET BY MOUTH EVERY DAY 30 tablet 8   No current facility-administered medications for this visit.      Allergies:   Alendronate sodium; Atorvastatin; Codeine;  Ezetimibe-simvastatin; Lovastatin; Penicillins; and Rosiglitazone maleate   Social History:  The patient  reports that she has never smoked. She has never used smokeless tobacco. She reports that she does not drink alcohol or use drugs.   Family History:   Family history is unknown by patient.    Review of Systems: Review of Systems  Constitutional: Negative.   Respiratory: Negative.   Cardiovascular: Negative.   Gastrointestinal: Positive for diarrhea.       Difficulty swallowing food  Musculoskeletal: Negative.   Neurological: Negative.   Psychiatric/Behavioral: Negative.   All other systems reviewed and are negative.    PHYSICAL EXAM: VS:  BP (!) 154/72 (BP Location: Left Arm, Patient Position: Sitting, Cuff Size: Normal)   Pulse 73   Ht 5\' 3"  (1.6 m)   Wt 173 lb (78.5 kg)   BMI 30.65 kg/m  , BMI Body mass index is  30.65 kg/m. GEN: Well nourished, well developed, in no acute distress  HEENT: normal  Neck: no JVD, carotid bruits, or masses Cardiac: Irregularly irregular, no murmurs, rubs, or gallops, nonpitting swelling Respiratory:  clear to auscultation bilaterally, normal work of breathing GI: soft, nontender, nondistended, + BS MS: no deformity or atrophy  Skin: warm and dry, no rash Neuro:  Strength and sensation are intact Psych: euthymic mood, full affect    Recent Labs: No results found for requested labs within last 8760 hours.    Lipid Panel Lab Results  Component Value Date   CHOL 209 (H) 02/24/2014   HDL 51.60 02/24/2014   LDLCALC 124 (H) 02/24/2014   TRIG 168.0 (H) 02/24/2014      Wt Readings from Last 3 Encounters:  12/21/15 173 lb (78.5 kg)  09/17/15 174 lb (78.9 kg)  06/24/15 174 lb 8 oz (79.2 kg)       ASSESSMENT AND PLAN:  Persistent atrial fibrillation ([place]) - Plan: EKG 12-Lead Rate well controlled on anticoagulation No changes to her medications  Hyperlipemia Currently not on a cholesterol medication Very mild coronary  disease of the RCA on prior cardiac catheterization We'll discuss with her again in follow-up  Essential hypertension, benign Blood pressure well controlled at home Missed several doses of her blood pressure medication recently No changes to her medications  Chronic diastolic CHF (congestive heart failure) ([place]) Appears relatively euvolemic on today's visit Leg swelling from venous insufficiency Recommended compression hose  Transient cerebral ischemia, unspecified transient cerebral ischemia type Currently on aggressive anticoagulation, no further TIA or stroke symptoms  Type 2 diabetes mellitus with neurological manifestations, controlled ([place]) Recommended walking program, low carbohydrates  Localized edema As above, leg swelling from venous insufficiency. No overt signs of heart failure She does take Lasix as needed for worsening leg swelling  Shortness of breath Mild shortness of breath, chronic issue, likely secondary to underlying atrial fibrillation, deconditioning, obesity. Recommended regular walking program   Total encounter time more than 25 minutes  Greater than 50% was spent in counseling and coordination of care with the patient   Disposition:   F/U  6 months   Orders Placed This Encounter  Procedures  . EKG 12-Lead     Signed, Esmond Plants, M.D., Ph.D. 12/21/2015  [place], [place]

## 2015-12-21 NOTE — Patient Instructions (Addendum)
Medication Instructions:   No medication changes made  Labwork:  No new labs needed  Testing/Procedures:  No further testing at this time  Phone number for [name]:   Coupon for pill  Follow-Up: It was a pleasure seeing you in the office today. Please call us if you have new issues that need to be addressed before your next appt.  [phone]  Your physician wants you to follow-up in: 6 months.  You will receive a reminder letter in the mail two months in advance. If you don't receive a letter, please call our office to schedule the follow-up appointment.  If you need a refill on your cardiac medications before your next appointment, please call your pharmacy.

## 2016-01-21 DIAGNOSIS — J029 Acute pharyngitis, unspecified: Secondary | ICD-10-CM | POA: Diagnosis not present

## 2016-01-21 DIAGNOSIS — R609 Edema, unspecified: Secondary | ICD-10-CM | POA: Diagnosis not present

## 2016-01-21 DIAGNOSIS — G8252 Quadriplegia, C1-C4 incomplete: Secondary | ICD-10-CM | POA: Diagnosis not present

## 2016-01-21 DIAGNOSIS — R69 Illness, unspecified: Secondary | ICD-10-CM | POA: Diagnosis not present

## 2016-01-21 DIAGNOSIS — K592 Neurogenic bowel, not elsewhere classified: Secondary | ICD-10-CM | POA: Diagnosis not present

## 2016-02-02 ENCOUNTER — Ambulatory Visit (INDEPENDENT_AMBULATORY_CARE_PROVIDER_SITE_OTHER): Payer: Medicare HMO | Admitting: Gastroenterology

## 2016-02-02 ENCOUNTER — Encounter: Payer: Self-pay | Admitting: Gastroenterology

## 2016-02-02 ENCOUNTER — Other Ambulatory Visit: Payer: Self-pay

## 2016-02-02 VITALS — BP 136/70 | HR 69 | Temp 97.8°F | Ht 63.0 in | Wt 170.0 lb

## 2016-02-02 DIAGNOSIS — K5900 Constipation, unspecified: Secondary | ICD-10-CM | POA: Diagnosis not present

## 2016-02-02 NOTE — Patient Instructions (Signed)
Avoid dairy products for 1 week. If you have some improvement,  you can switch to lactase milk. If no improvement, start Citracel once daily and may increase to two times daily. Stop the Miralax. If you still are not having daily firm stools, you may add liquid imodium as directed on the bottle.   Please contact us with any questions.

## 2016-02-03 ENCOUNTER — Telehealth: Payer: Self-pay | Admitting: Cardiovascular Disease

## 2016-02-03 NOTE — Telephone Encounter (Signed)
Patient calling the office for samples of medication:   1.  What medication and dosage are you requesting samples for? xarelto  2.  Are you currently out of this medication?  Has a few days left.

## 2016-02-03 NOTE — Telephone Encounter (Signed)
Notified patient samples of Xarelto 20 mg are available to pick up.  Gave Xarelto 20 mg x 4 bottles.

## 2016-02-03 NOTE — Progress Notes (Signed)
Gastroenterology Consultation  Referring Provider:     Lavonne Chick, MD Primary Care Physician:  Lavonne Chick, MD Primary Gastroenterologist:  [name]     Reason for Consultation:     Diarrhea        HPI:   Jodi Reeves is a 82 y.o. y/o female referred for consultation & management of Diarrhea by Dr. Tobin Chad, Terance Hart, MD.  This patient comes today with a history of diarrhea. The patient reports that she has the diarrhea in the morning and usually makes no appointments during the morning hours for fear that she may have diarrhea. The patient denies drinking milk but when asked further she states that she eats cereal often with milk and has cheese on a regular basis. The patient had been seen in the past by Dr. Candace Cruise.  The patient underwent a colonoscopy back in March of last year without any cause of the diarrhea being seen. The patient reports that she will very rarely have any diarrhea later in the day. She has taken Imodium in the past but reports that when she takes it she will then not move her bowels for a few days. The patient then will start taking MiraLAX for her constipation. There is no report of any unexplained weight loss, fevers, chills, nausea or vomiting.  Past Medical History:  Diagnosis Date  . Anxiety   . Atrial fibrillation ([place])   . Atrial fibrillation ([place])   . Balance disorder   . DM2 (diabetes mellitus, type 2) ([place])   . Edema, lower extremity   . GERD (gastroesophageal reflux disease)    esophageal web  . Granuloma annulare    [name].   Marland Kitchen HLD (hyperlipidemia)   . HTN (hypertension)   . Hypothyroidism   . Incontinence of bowel   . Osteoporosis   . Ovarian cancer ([place])   . TIA (transient ischemic attack)     Past Surgical History:  Procedure Laterality Date  . 2nd look laparotomy  1982  . ABDOMINAL HYSTERECTOMY    . CARDIAC CATHETERIZATION  8/05   neg; a fib found   . cataract OD  2002  . cataract surgery  5/07   R  .  CHOLECYSTECTOMY  1986  . DEXA  9/04 and 1/02  . EGD/dilation/colon  1/06  . FEMORAL HERNIA REPAIR  1958   R  . fracture L elbow/wrist  2000  . intussception/obstruction  12/98  . kidney stone x2  1991  . laminectomy L4-5  1971  . LUMBAR LAMINECTOMY    . MOUTH SURGERY    . myoview stress  8/05   syncope (-)  . REVERSE SHOULDER ARTHROPLASTY Left 10/06/2014   Procedure: REVERSE SHOULDER ARTHROPLASTY;  Surgeon: Corky Mull, MD;  Location: [facility] ORS;  Service: Orthopedics;  Laterality: Left;  . stillbirth  1969  . TOTAL ABDOMINAL HYSTERECTOMY W/ BILATERAL SALPINGOOPHORECTOMY  1980   ovarian cancer  . WRIST FRACTURE SURGERY  11/05   R    Prior to Admission medications   Medication Sig Start Date End Date Taking? Authorizing Provider  Ascorbic Acid (VITAMIN C) 1000 MG tablet Take 1,000 mg by mouth daily.     Yes Historical Provider, MD  BD PEN NEEDLE NANO U/F 32G X 4 MM MISC Inject 1 Syringe as directed as needed.  04/28/11  Yes Historical Provider, MD  bisoprolol-hydrochlorothiazide (ZIAC) 2.5-6.25 MG tablet Take 1 tablet by mouth daily. 12/21/15 12/20/16 Yes Minna Merritts, MD  CALCIUM &  MAGNESIUM CARBONATES PO Take 1 capsule by mouth daily.    Yes Historical Provider, MD  Cholecalciferol (VITAMIN D3) 2000 UNITS TABS Take by mouth daily.   Yes Historical Provider, MD  digoxin (LANOXIN) 0.125 MG tablet TAKE ONE (1) TABLET EACH DAY 06/14/15  Yes Venia Carbon, MD  Folic Acid-Vit Q000111Q 123456 (FA-VITAMIN B-6-VITAMIN B-12) 2.2-25-0.5 MG TABS Place under the tongue daily.     Yes Historical Provider, MD  furosemide (LASIX) 20 MG tablet Take 1 tablet (20 mg total) by mouth 2 (two) times daily as needed. 06/11/14  Yes Minna Merritts, MD  glipiZIDE (GLUCOTROL XL) 2.5 MG 24 hr tablet Take 2.5 mg by mouth daily with breakfast.   Yes Historical Provider, MD  glucose blood (ONE TOUCH ULTRA TEST) test strip Use to test blood sugar once daily E11.49 06/24/15  Yes [name], MD  insulin NPH Human  (HUMULIN N,NOVOLIN N) 100 UNIT/ML injection Inject 16 Units into the skin at bedtime.    Yes Historical Provider, MD  levothyroxine (SYNTHROID, LEVOTHROID) 50 MCG tablet Take 50 mcg by mouth daily before breakfast.   Yes Historical Provider, MD  metFORMIN (GLUCOPHAGE-XR) 500 MG 24 hr tablet Take two tablet by mouth in the morning and 1 tablet by mouth in the evening 11/30/10  Yes Venia Carbon, MD  Multiple Vitamin (MULTIVITAMIN) capsule Take 1 capsule by mouth daily.   Yes Historical Provider, MD  polyethylene glycol powder (MIRALAX) powder Take 17 g by mouth as needed.    Yes Historical Provider, MD  XARELTO 20 MG TABS tablet TAKE ONE (1) TABLET BY MOUTH EVERY DAY 11/16/15  Yes Minna Merritts, MD  clindamycin (CLEOCIN) 300 MG capsule  01/05/16   Historical Provider, MD  fluorometholone (FML) 0.1 % ophthalmic suspension Place 1 drop into both eyes as needed.  01/23/13   Historical Provider, MD  potassium chloride (K-DUR) 10 MEQ tablet Take 1 tablet (10 mEq total) by mouth daily as needed. Patient not taking: Reported on 02/02/2016 12/21/14   Minna Merritts, MD    Family History  Problem Relation Age of Onset  . Family history unknown: Yes     Social History  Substance Use Topics  . Smoking status: Never Smoker  . Smokeless tobacco: Never Used  . Alcohol use No     Comment: wine occ.    Allergies as of 02/02/2016 - Review Complete 02/02/2016  Allergen Reaction Noted  . Alendronate sodium    . Atorvastatin    . Codeine    . Ezetimibe-simvastatin    . Lovastatin    . Penicillins    . Rosiglitazone maleate      Review of Systems:    All systems reviewed and negative except where noted in HPI.   Physical Exam:  BP 136/70   Pulse 69   Temp 97.8 F (36.6 C) (Oral)   Ht 5\' 3"  (1.6 m)   Wt 170 lb (77.1 kg)   BMI 30.11 kg/m  No LMP recorded. Patient has had a hysterectomy. Psych:  Alert and cooperative. Normal mood and affect. General:   Alert,  Well-developed,  well-nourished, pleasant and cooperative in NAD Head:  Normocephalic and atraumatic. Eyes:  Sclera clear, no icterus.   Conjunctiva pink. Ears:  Normal auditory acuity. Nose:  No deformity, discharge, or lesions. Mouth:  No deformity or lesions,oropharynx pink & moist. Neck:  Supple; no masses or thyromegaly. Lungs:  Respirations even and unlabored.  Clear throughout to auscultation.   No  wheezes, crackles, or rhonchi. No acute distress. Heart:  Regular rate and rhythm; no murmurs, clicks, rubs, or gallops. Abdomen:  Normal bowel sounds.  No bruits.  Soft, non-tender and non-distended without masses, hepatosplenomegaly or hernias noted.  No guarding or rebound tenderness.  Negative Carnett sign.   Rectal:  Deferred.  Msk:  Symmetrical without gross deformities.  Good, equal movement & strength bilaterally. Pulses:  Normal pulses noted. Extremities:  No clubbing or edema.  No cyanosis. Neurologic:  Alert and oriented x3;  grossly normal neurologically. Skin:  Intact without significant lesions or rashes.  No jaundice. Lymph Nodes:  No significant cervical adenopathy. Psych:  Alert and cooperative. Normal mood and affect.  Imaging Studies: No results found.  Assessment and Plan:   Jodi Reeves is a 82 y.o. y/o female has chronic diarrhea for many years. She states that it has been getting worse recently. The patient has been evaluated by Dr. Candace Cruise in the past with a negative workup. The patient has been told to avoid dairy products for one week to see if her symptoms improve. If there is no improvement the patient has been told to take Citrucel once daily and may increase it to twice a day. She has also been told to stop the MiraLAX. If her stools do not become more firm she has been told to try to take liquid Imodium so that she can titrate the dose so that she is not having constipation. The patient has been explained the plan and agrees with it.   Note: This dictation was prepared with  Dragon dictation along with smaller phrase technology. Any transcriptional errors that result from this process are unintentional.

## 2016-03-01 ENCOUNTER — Ambulatory Visit: Payer: Medicare HMO | Admitting: Podiatry

## 2016-03-03 DIAGNOSIS — G8252 Quadriplegia, C1-C4 incomplete: Secondary | ICD-10-CM | POA: Diagnosis not present

## 2016-03-03 DIAGNOSIS — K592 Neurogenic bowel, not elsewhere classified: Secondary | ICD-10-CM | POA: Diagnosis not present

## 2016-03-03 DIAGNOSIS — R69 Illness, unspecified: Secondary | ICD-10-CM | POA: Diagnosis not present

## 2016-03-03 DIAGNOSIS — J029 Acute pharyngitis, unspecified: Secondary | ICD-10-CM | POA: Diagnosis not present

## 2016-03-03 DIAGNOSIS — R609 Edema, unspecified: Secondary | ICD-10-CM | POA: Diagnosis not present

## 2016-03-06 DIAGNOSIS — R69 Illness, unspecified: Secondary | ICD-10-CM | POA: Diagnosis not present

## 2016-03-06 DIAGNOSIS — K592 Neurogenic bowel, not elsewhere classified: Secondary | ICD-10-CM | POA: Diagnosis not present

## 2016-03-06 DIAGNOSIS — G8252 Quadriplegia, C1-C4 incomplete: Secondary | ICD-10-CM | POA: Diagnosis not present

## 2016-03-06 DIAGNOSIS — J029 Acute pharyngitis, unspecified: Secondary | ICD-10-CM | POA: Diagnosis not present

## 2016-03-06 DIAGNOSIS — R609 Edema, unspecified: Secondary | ICD-10-CM | POA: Diagnosis not present

## 2016-03-08 ENCOUNTER — Telehealth: Payer: Self-pay | Admitting: Cardiovascular Disease

## 2016-03-08 NOTE — Telephone Encounter (Signed)
Patient calling the office for samples of medication:   1.  What medication and dosage are you requesting samples for? xarelto 20 mg  2.  Are you currently out of this medication?  Has just enough to last the rest of this week

## 2016-03-09 NOTE — Telephone Encounter (Signed)
Xarelto 20 mg samples placed at front desk for pick up. 

## 2016-03-09 NOTE — Telephone Encounter (Signed)
Patient notified no samples available of xarelto 20 mg; told the patient we are contacting the pharmaceutical rep for samples and we will contact her when samples arrived.

## 2016-03-15 DIAGNOSIS — Z794 Long term (current) use of insulin: Secondary | ICD-10-CM | POA: Diagnosis not present

## 2016-03-15 DIAGNOSIS — E11649 Type 2 diabetes mellitus with hypoglycemia without coma: Secondary | ICD-10-CM | POA: Diagnosis not present

## 2016-03-15 DIAGNOSIS — E039 Hypothyroidism, unspecified: Secondary | ICD-10-CM | POA: Diagnosis not present

## 2016-03-21 DIAGNOSIS — E11649 Type 2 diabetes mellitus with hypoglycemia without coma: Secondary | ICD-10-CM | POA: Diagnosis not present

## 2016-03-21 DIAGNOSIS — E1129 Type 2 diabetes mellitus with other diabetic kidney complication: Secondary | ICD-10-CM | POA: Diagnosis not present

## 2016-03-21 DIAGNOSIS — I10 Essential (primary) hypertension: Secondary | ICD-10-CM | POA: Diagnosis not present

## 2016-03-21 DIAGNOSIS — E039 Hypothyroidism, unspecified: Secondary | ICD-10-CM | POA: Diagnosis not present

## 2016-03-21 DIAGNOSIS — Z794 Long term (current) use of insulin: Secondary | ICD-10-CM | POA: Diagnosis not present

## 2016-03-21 DIAGNOSIS — R809 Proteinuria, unspecified: Secondary | ICD-10-CM | POA: Diagnosis not present

## 2016-03-27 ENCOUNTER — Ambulatory Visit: Payer: Medicare HMO | Admitting: Podiatry

## 2016-03-29 DIAGNOSIS — H353131 Nonexudative age-related macular degeneration, bilateral, early dry stage: Secondary | ICD-10-CM | POA: Diagnosis not present

## 2016-03-29 DIAGNOSIS — E113293 Type 2 diabetes mellitus with mild nonproliferative diabetic retinopathy without macular edema, bilateral: Secondary | ICD-10-CM | POA: Diagnosis not present

## 2016-04-06 ENCOUNTER — Telehealth: Payer: Self-pay

## 2016-04-06 NOTE — Telephone Encounter (Signed)
Spoke to pt. She does not want to do the Dexa Scan

## 2016-04-19 ENCOUNTER — Telehealth: Payer: Self-pay | Admitting: Gastroenterology

## 2016-04-19 DIAGNOSIS — I110 Hypertensive heart disease with heart failure: Secondary | ICD-10-CM | POA: Diagnosis not present

## 2016-04-19 DIAGNOSIS — R69 Illness, unspecified: Secondary | ICD-10-CM | POA: Diagnosis not present

## 2016-04-19 DIAGNOSIS — Z96612 Presence of left artificial shoulder joint: Secondary | ICD-10-CM | POA: Diagnosis not present

## 2016-04-19 DIAGNOSIS — E1149 Type 2 diabetes mellitus with other diabetic neurological complication: Secondary | ICD-10-CM | POA: Diagnosis not present

## 2016-04-19 DIAGNOSIS — E039 Hypothyroidism, unspecified: Secondary | ICD-10-CM | POA: Diagnosis not present

## 2016-04-19 DIAGNOSIS — I4891 Unspecified atrial fibrillation: Secondary | ICD-10-CM | POA: Diagnosis not present

## 2016-04-19 DIAGNOSIS — R110 Nausea: Secondary | ICD-10-CM | POA: Diagnosis not present

## 2016-04-19 DIAGNOSIS — R079 Chest pain, unspecified: Secondary | ICD-10-CM | POA: Diagnosis not present

## 2016-04-19 DIAGNOSIS — E785 Hyperlipidemia, unspecified: Secondary | ICD-10-CM | POA: Diagnosis not present

## 2016-04-19 DIAGNOSIS — R142 Eructation: Secondary | ICD-10-CM | POA: Diagnosis not present

## 2016-04-19 DIAGNOSIS — Q394 Esophageal web: Secondary | ICD-10-CM | POA: Diagnosis not present

## 2016-04-19 DIAGNOSIS — I517 Cardiomegaly: Secondary | ICD-10-CM | POA: Diagnosis not present

## 2016-04-19 DIAGNOSIS — R0789 Other chest pain: Secondary | ICD-10-CM | POA: Diagnosis not present

## 2016-04-19 DIAGNOSIS — I5032 Chronic diastolic (congestive) heart failure: Secondary | ICD-10-CM | POA: Diagnosis not present

## 2016-04-19 DIAGNOSIS — R1013 Epigastric pain: Secondary | ICD-10-CM | POA: Diagnosis not present

## 2016-04-19 NOTE — Telephone Encounter (Signed)
Scheduled appt 1/8 but should she have an EGD. She feels food/pills are getting stuck and having some burning. Please call.

## 2016-04-20 DIAGNOSIS — R079 Chest pain, unspecified: Secondary | ICD-10-CM | POA: Diagnosis not present

## 2016-04-20 DIAGNOSIS — I517 Cardiomegaly: Secondary | ICD-10-CM | POA: Diagnosis not present

## 2016-04-20 DIAGNOSIS — K8689 Other specified diseases of pancreas: Secondary | ICD-10-CM | POA: Diagnosis not present

## 2016-04-20 DIAGNOSIS — K838 Other specified diseases of biliary tract: Secondary | ICD-10-CM | POA: Diagnosis not present

## 2016-04-21 NOTE — Telephone Encounter (Signed)
Pt stated she was in the hospital yesterday due to chest pain. They felt it was more esophagus than heart. Pt has an appt with [name] on Tuesday, Jan 2nd. Will discuss treatment options at that time.

## 2016-04-25 ENCOUNTER — Encounter: Payer: Self-pay | Admitting: Gastroenterology

## 2016-04-25 ENCOUNTER — Ambulatory Visit (INDEPENDENT_AMBULATORY_CARE_PROVIDER_SITE_OTHER): Payer: Medicare HMO | Admitting: Gastroenterology

## 2016-04-25 VITALS — BP 152/73 | HR 68 | Temp 98.0°F | Ht 63.0 in | Wt 169.5 lb

## 2016-04-25 DIAGNOSIS — R197 Diarrhea, unspecified: Secondary | ICD-10-CM

## 2016-04-25 DIAGNOSIS — K219 Gastro-esophageal reflux disease without esophagitis: Secondary | ICD-10-CM

## 2016-04-25 DIAGNOSIS — R1310 Dysphagia, unspecified: Secondary | ICD-10-CM

## 2016-04-25 NOTE — Progress Notes (Signed)
Primary Care Physician: Lavonne Chick, MD  Primary Gastroenterologist:  Dr. Lucilla Lame  Chief Complaint  Patient presents with  . Dysphagia    HPI: Jodi Reeves is a 83 y.o. female here for follow-up after being discharged from the hospital. The patient was at [facility] for chest pain. The patient states that she was ruled out for any cardiac issues and was told to follow-up with her gastrologist. The patient also reports that she has had some dysphagia with problems swallowing which she has had in the past. The patient had an upper endoscopy by Dr. Candace Cruise in March with dilation at that time. The patient now reports that the diarrhea that I had seen her for in the past has now resolved since she stopped taking the MiraLAX and avoiding milk products. The patient reports that her dysphagia has been more prevalent last few months. He also reports that her epigastric pain when due to her back.  Current Outpatient Prescriptions  Medication Sig Dispense Refill  . Ascorbic Acid (VITAMIN C) 1000 MG tablet Take 1,000 mg by mouth daily.      . BD PEN NEEDLE NANO U/F 32G X 4 MM MISC Inject 1 Syringe as directed as needed.     . bisoprolol-hydrochlorothiazide (ZIAC) 2.5-6.25 MG tablet Take 1 tablet by mouth daily. 90 tablet 3  . CALCIUM & MAGNESIUM CARBONATES PO Take 1 capsule by mouth daily.     . Cholecalciferol (VITAMIN D3) 2000 UNITS TABS Take by mouth daily.    . clindamycin (CLEOCIN) 300 MG capsule     . digoxin (LANOXIN) 0.125 MG tablet TAKE ONE (1) TABLET EACH DAY 90 tablet 3  . famotidine (PEPCID) 20 MG tablet Take 20 mg by mouth.    . fluorometholone (FML) 0.1 % ophthalmic suspension Place 1 drop into both eyes as needed.     . Folic Acid-Vit Q000111Q 123456 (FA-VITAMIN B-6-VITAMIN B-12) 2.2-25-0.5 MG TABS Place under the tongue daily.      . furosemide (LASIX) 20 MG tablet Take 1 tablet (20 mg total) by mouth 2 (two) times daily as needed. 180 tablet 3  . glipiZIDE (GLUCOTROL XL) 2.5 MG  24 hr tablet Take 2.5 mg by mouth daily with breakfast.    . glucose blood (ONE TOUCH ULTRA TEST) test strip Use to test blood sugar once daily E11.49 100 each 1  . insulin NPH Human (HUMULIN N,NOVOLIN N) 100 UNIT/ML injection Inject 16 Units into the skin at bedtime.     Marland Kitchen levothyroxine (SYNTHROID, LEVOTHROID) 50 MCG tablet Take 50 mcg by mouth daily before breakfast.    . metFORMIN (GLUCOPHAGE-XR) 500 MG 24 hr tablet Take two tablet by mouth in the morning and 1 tablet by mouth in the evening    . Multiple Vitamin (MULTIVITAMIN) capsule Take 1 capsule by mouth daily.    . polyethylene glycol powder (MIRALAX) powder Take 17 g by mouth as needed.     . potassium chloride (K-DUR) 10 MEQ tablet Take 1 tablet (10 mEq total) by mouth daily as needed. 30 tablet 6  . XARELTO 20 MG TABS tablet TAKE ONE (1) TABLET BY MOUTH EVERY DAY 30 tablet 8   No current facility-administered medications for this visit.     Allergies as of 04/25/2016 - Review Complete 04/25/2016  Allergen Reaction Noted  . Alendronate sodium    . Atorvastatin    . Ciprofloxacin Other (See Comments) 09/17/2015  . Codeine    . Ezetimibe-simvastatin    .  Fluticasone Other (See Comments) 03/21/2016  . Lovastatin    . Penicillins    . Rosiglitazone maleate    . Sulfamethoxazole-trimethoprim Rash 09/17/2015    ROS:  General: Negative for anorexia, weight loss, fever, chills, fatigue, weakness. ENT: Negative for hoarseness, difficulty swallowing , nasal congestion. CV: Negative for chest pain, angina, palpitations, dyspnea on exertion, peripheral edema.  Respiratory: Negative for dyspnea at rest, dyspnea on exertion, cough, sputum, wheezing.  GI: See history of present illness. GU:  Negative for dysuria, hematuria, urinary incontinence, urinary frequency, nocturnal urination.  Endo: Negative for unusual weight change.    Physical Examination:   BP (!) 152/73   Pulse 68   Temp 98 F (36.7 C) (Oral)   Ht 5\' 3"  (1.6 m)    Wt 169 lb 8 oz (76.9 kg)   BMI 30.03 kg/m   General: Well-nourished, well-developed in no acute distress.  Eyes: No icterus. Conjunctivae pink. Mouth: Oropharyngeal mucosa moist and pink , no lesions erythema or exudate. Lungs: Clear to auscultation bilaterally. Non-labored. Heart: Regular rate and rhythm, no murmurs rubs or gallops.  Abdomen: Bowel sounds are normal, nontender, nondistended, no hepatosplenomegaly or masses, no abdominal bruits or hernia , no rebound or guarding.   Extremities: No lower extremity edema. No clubbing or deformities. Neuro: Alert and oriented x 3.  Grossly intact. Skin: Warm and dry, no jaundice.   Psych: Alert and cooperative, normal mood and affect.  Labs:    Imaging Studies: No results found.  Assessment and Plan:   Jodi Reeves is a 83 y.o. y/o female who was recently in the hospital for chest pain. The patient was thought to have chest pain from a GI cause and for her dysphagia. The patient hasn't a point with her cardiologist next week. The patient has been told that if her cardiologist feels that her heart is not the issue and that she is safe to undergo a upper endoscopy with possible dilation that she should call our office back and we will set her up for that. The patient has been explained the plan and agrees with it.    Lucilla Lame, MD. Marval Regal   Note: This dictation was prepared with Dragon dictation along with smaller phrase technology. Any transcriptional errors that result from this process are unintentional.

## 2016-05-01 ENCOUNTER — Ambulatory Visit: Payer: Medicare HMO | Admitting: Gastroenterology

## 2016-05-02 ENCOUNTER — Ambulatory Visit (INDEPENDENT_AMBULATORY_CARE_PROVIDER_SITE_OTHER): Payer: Medicare HMO | Admitting: Cardiovascular Disease

## 2016-05-02 ENCOUNTER — Encounter: Payer: Self-pay | Admitting: Cardiovascular Disease

## 2016-05-02 VITALS — BP 144/80 | HR 70 | Ht 63.5 in | Wt 171.5 lb

## 2016-05-02 DIAGNOSIS — K222 Esophageal obstruction: Secondary | ICD-10-CM | POA: Insufficient documentation

## 2016-05-02 DIAGNOSIS — I481 Persistent atrial fibrillation: Secondary | ICD-10-CM

## 2016-05-02 DIAGNOSIS — I5032 Chronic diastolic (congestive) heart failure: Secondary | ICD-10-CM

## 2016-05-02 DIAGNOSIS — E1149 Type 2 diabetes mellitus with other diabetic neurological complication: Secondary | ICD-10-CM

## 2016-05-02 DIAGNOSIS — I4819 Other persistent atrial fibrillation: Secondary | ICD-10-CM

## 2016-05-02 DIAGNOSIS — R69 Illness, unspecified: Secondary | ICD-10-CM | POA: Diagnosis not present

## 2016-05-02 DIAGNOSIS — I10 Essential (primary) hypertension: Secondary | ICD-10-CM | POA: Diagnosis not present

## 2016-05-02 DIAGNOSIS — E782 Mixed hyperlipidemia: Secondary | ICD-10-CM | POA: Diagnosis not present

## 2016-05-02 NOTE — Progress Notes (Signed)
Cardiology Office Note  Date:  05/02/2016   ID:  Jodi Reeves, DOB 11-30-1927, MRN [id number]  PCP:  Lavonne Chick, MD   Chief Complaint  Patient presents with  . other     Early f/u. [facility] ED 12/27. pt calling stating she is being  for chest pain. Pt c/o chest pain at times. Reviewed meds with pt verbally.    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 No intervention was needed.  She presents for follow-up of her atrial fibrillation and chest pain  Previously had diarrhea, now with constipation. Working with GI  Went to the ER 04/19/2016,  complaints of Chest pain, difficulty swallowing  sounded more esophageal per the notes several days of difficulty with swallowing food and medicine.  Her troponins returned negative, her EKGs with no changes CT scan of the chest performed, limited echocardiogram performed  On day of discharge, she ate normal food and had no difficulty with dysphagia or pain. She was discharged home.  Results below discussed with her in detail   results of CT chest 04/20/2016: Calcifications of the aortic arch. mild calcification of the coronary arteries. Moderate calcifications of the abdominal aorta, which is tortuous. The origins of the celiac, SMA, single bilateral renal arteries are patent. The origin of the IMA is not seen, and may be occluded chronically.  Results of ER echo (limited) 04/19/2016 No sonographic evidence of significant cardiac dysfunction, No sonographic  evidence of significant pericardial effusion, Normal RV, No sonographic  evidence of volume depletion and Pericardial effusion:with a SMALL  Effusion  Recently she reports having worsening Leg swelling,  Uses TEDS. Has been less active recently  secondary to the cold  EKG on today's visit shows atrial fibrillation with ventricular rate 70 bpm, nonspecific ST abnormality, poor R-wave progression through the anterior precordial leads  Other past medical history Previous stretching of esophagus with Dr. Candace Cruise  Bp runs better at home, typically 123456 systolic Lab work reviewed with her HA1C 7.1, TSH 2  TIA in January 2017, went to [facility] for evaluation She was talking with a friend, had word finding difficulty, friend was finishing her sentences Workup at [facility] reviewed She had CT scan of her head which was essentially normal,  carotid ultrasound that showed mild bilateral disease, no significant stenoses,  echocardiogram with ejection fraction greater than 65%, mild valve abnormality  Previous total cholesterol 201, LDL 107, hemoglobin A1c 6.9  Diabetes managed by Dr. 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    PMH:   has a past medical history of Anxiety; Atrial fibrillation ([place]); Atrial fibrillation ([place]); Balance disorder; DM2 (diabetes mellitus, type 2) ([place]); Edema, lower extremity; GERD (gastroesophageal reflux disease); Granuloma annulare; HLD (hyperlipidemia); HTN (hypertension); Hypothyroidism; Incontinence of bowel; Osteoporosis; Ovarian cancer ([place]); and TIA (transient ischemic attack).  PSH:  Past Surgical History:  Procedure Laterality Date  . 2nd look laparotomy  1982  . ABDOMINAL HYSTERECTOMY    . CARDIAC CATHETERIZATION  8/05   neg; a fib found   . cataract OD  2002  .  cataract surgery  5/07   R  . CHOLECYSTECTOMY  1986  . DEXA  9/04 and 1/02  . EGD/dilation/colon  1/06  . FEMORAL HERNIA REPAIR  1958   R  . fracture L elbow/wrist  2000  . intussception/obstruction  12/98  . kidney stone x2  1991  . laminectomy L4-5  1971  . LUMBAR LAMINECTOMY    . MOUTH SURGERY    . myoview stress  8/05   syncope (-)  . REVERSE SHOULDER ARTHROPLASTY Left 10/06/2014   Procedure: REVERSE SHOULDER ARTHROPLASTY;  Surgeon: Corky Mull, MD;  Location: [facility] ORS;  Service: Orthopedics;  Laterality: Left;  . stillbirth  1969  . TOTAL ABDOMINAL HYSTERECTOMY W/ BILATERAL SALPINGOOPHORECTOMY  1980   ovarian cancer  . WRIST FRACTURE SURGERY  11/05   R    Current Outpatient Prescriptions  Medication Sig Dispense Refill  . Ascorbic Acid (VITAMIN C) 1000 MG tablet Take 1,000 mg by mouth daily.      . BD PEN NEEDLE NANO U/F 32G X 4 MM MISC Inject 1 Syringe as directed as needed.     . bisoprolol-hydrochlorothiazide (ZIAC) 2.5-6.25 MG tablet Take 1 tablet by mouth daily. 90 tablet 3  . CALCIUM & MAGNESIUM CARBONATES PO Take 2 capsules by mouth daily.     . Cholecalciferol (VITAMIN D3) 2000 UNITS TABS Take by mouth daily.    Marland Kitchen co-enzyme Q-10 30 MG capsule Take 30 mg by mouth 3 (three) times daily.    . digoxin (LANOXIN) 0.125 MG tablet TAKE ONE (1) TABLET EACH DAY 90 tablet 3  . famotidine (PEPCID) 20 MG tablet Take 20 mg by mouth 2 (two) times daily.     . fluorometholone (FML) 0.1 % ophthalmic suspension Place 1 drop into both eyes as needed.     . Folic Acid-Vit Q000111Q 123456 (FA-VITAMIN B-6-VITAMIN B-12) 2.2-25-0.5 MG TABS Place under the tongue daily.      . furosemide (LASIX) 20 MG tablet Take 1 tablet (20 mg total) by mouth 2 (two) times daily as needed. 180 tablet 3  . glipiZIDE (GLUCOTROL XL) 2.5 MG 24 hr tablet Take 2.5 mg by mouth daily with breakfast.    . glucose blood (ONE TOUCH ULTRA TEST) test strip Use to test blood sugar once daily E11.49 100 each 1  . insulin  NPH Human (HUMULIN N,NOVOLIN N) 100 UNIT/ML injection Inject 16 Units into the skin at bedtime.     Marland Kitchen levothyroxine (SYNTHROID, LEVOTHROID) 50 MCG tablet Take 50 mcg by mouth daily before breakfast.    . metFORMIN (GLUCOPHAGE) 500 MG tablet Take 500 mg by mouth 2 (two) times daily.    . Multiple Vitamin (MULTIVITAMIN) capsule Take 1 capsule by mouth daily.    . Omega-3 Fatty Acids (OMEGA-3 FISH OIL PO) Take by mouth.    . potassium chloride (K-DUR) 10 MEQ tablet Take 1 tablet (10 mEq total) by mouth daily as needed. 30 tablet 6  . XARELTO 20 MG TABS tablet TAKE ONE (1) TABLET BY MOUTH EVERY DAY 30 tablet 8   No current facility-administered medications for this visit.      Allergies:   Alendronate sodium; Atorvastatin; Ciprofloxacin; Codeine; Ezetimibe-simvastatin; Fluticasone; Lovastatin; Penicillins; Rosiglitazone maleate; and Sulfamethoxazole-trimethoprim   Social History:  The  patient  reports that she has never smoked. She has never used smokeless tobacco. She reports that she does not drink alcohol or use drugs.   Family History:   Family history is unknown by patient.    Review of Systems: Review of Systems  Constitutional: Negative.   Respiratory: Negative.   Cardiovascular: Positive for chest pain and leg swelling.  Gastrointestinal: Negative.        Dysphagia  Musculoskeletal: Negative.   Neurological: Negative.   Psychiatric/Behavioral: Negative.   All other systems reviewed and are negative.    PHYSICAL EXAM: VS:  BP (!) 144/80 (BP Location: Left Arm, Patient Position: Sitting, Cuff Size: Normal)   Pulse 70   Ht 5' 3.5" (1.613 m)   Wt 171 lb 8 oz (77.8 kg)   BMI 29.90 kg/m  , BMI Body mass index is 29.9 kg/m. GEN: Well nourished, well developed, in no acute distress, obese  HEENT: normal  Neck: no JVD, carotid bruits, or masses Cardiac: Irregularly irregular, no murmurs, rubs, or gallops, nonpitting lower extremity edema  Respiratory:  clear to auscultation  bilaterally, normal work of breathing GI: soft, nontender, nondistended, + BS MS: no deformity or atrophy  Skin: warm and dry, no rash Neuro:  Strength and sensation are intact Psych: euthymic mood, full affect    Recent Labs: No results found for requested labs within last 8760 hours.    Lipid Panel Lab Results  Component Value Date   CHOL 209 (H) 02/24/2014   HDL 51.60 02/24/2014   LDLCALC 124 (H) 02/24/2014   TRIG 168.0 (H) 02/24/2014      Wt Readings from Last 3 Encounters:  05/02/16 171 lb 8 oz (77.8 kg)  04/25/16 169 lb 8 oz (76.9 kg)  02/02/16 170 lb (77.1 kg)       ASSESSMENT AND PLAN:  Mixed hyperlipidemia Discussed recent findings on her CT scan including mild coronary calcifications, aortic arch calcifications, moderate aortic atherosclerosis in the abdominal aorta She does not want a statin, reports having myalgias in the past  Essential hypertension, benign - Plan: EKG 12-Lead Blood pressure is well controlled on today's visit. No changes made to the medications.  Persistent atrial fibrillation (HCC) Heart rate well controlled, tolerating anticoagulation  Chronic diastolic CHF (congestive heart failure) (HCC) Worsening lower extremity edema after the holidays. Recommended she take Lasix twice a day for several days until edema improves Suggested she take this with extra potassium Recent normal renal function  Type 2 diabetes mellitus with neurological manifestations, controlled ([place]) We have encouraged continued exercise, careful diet management in an effort to lose weight.  Esophageal stricture Likely responsible for her chest pain symptoms. Recent negative workup She would be acceptable risk to proceed with EGD and esophageal stretching if needed with [name]. Recommended she hold her anticoagulation for 2 days prior to the procedure. Wait at least 24 hours after the procedure before restarting the medication   Total encounter time more than 25  minutes  Greater than 50% was spent in counseling and coordination of care with the patient   Disposition:   F/U  6 months   Orders Placed This Encounter  Procedures  . EKG 12-Lead     Signed, Esmond Plants, M.D., Ph.D. 05/02/2016  [place], [place]

## 2016-05-02 NOTE — Patient Instructions (Addendum)
Medication Instructions:   Please hold xarelto 2 days before the GI procedure Wait at least 24 hours after the procedure before restarting the [place] to take extra lasix for leg swelling  Labwork:  No new labs needed  Testing/Procedures:  No further testing at this time   I recommend watching educational videos on topics of interest to you at:       [email or web address]  Enter code: HEARTCARE    Follow-Up: It was a pleasure seeing you in the office today. Please call us if you have new issues that need to be addressed before your next appt.  [phone]  Your physician wants you to follow-up in: 6 months.  You will receive a reminder letter in the mail two months in advance. If you don't receive a letter, please call our office to schedule the follow-up appointment.  If you need a refill on your cardiac medications before your next appointment, please call your pharmacy.

## 2016-05-04 ENCOUNTER — Telehealth: Payer: Self-pay | Admitting: Gastroenterology

## 2016-05-04 NOTE — Telephone Encounter (Signed)
Patient left a voice message stating that [name] told her to call you after she saw the cardiologist. Please call. [phone]

## 2016-05-05 ENCOUNTER — Other Ambulatory Visit: Payer: Self-pay

## 2016-05-05 NOTE — Telephone Encounter (Signed)
Pt scheduled for EGD at [facility] with [place]. Dysphagia R13.10.

## 2016-05-05 NOTE — Telephone Encounter (Signed)
No authorization is required per [employer].

## 2016-05-15 ENCOUNTER — Ambulatory Visit: Payer: Medicare HMO | Admitting: Podiatry

## 2016-05-16 ENCOUNTER — Ambulatory Visit
Admission: RE | Admit: 2016-05-16 | Discharge: 2016-05-16 | Disposition: A | Discharge status: Home / Self Care | Payer: Medicare HMO | Source: Ambulatory Visit | Attending: Gastroenterology | Admitting: Gastroenterology

## 2016-05-16 ENCOUNTER — Encounter
Admission: RE | Disposition: A | Discharge status: Home / Self Care | Payer: Self-pay | Source: Ambulatory Visit | Attending: Gastroenterology

## 2016-05-16 ENCOUNTER — Ambulatory Visit: Payer: Medicare HMO | Admitting: Anesthesiology

## 2016-05-16 DIAGNOSIS — M810 Age-related osteoporosis without current pathological fracture: Secondary | ICD-10-CM | POA: Diagnosis not present

## 2016-05-16 DIAGNOSIS — Z8673 Personal history of transient ischemic attack (TIA), and cerebral infarction without residual deficits: Secondary | ICD-10-CM | POA: Diagnosis not present

## 2016-05-16 DIAGNOSIS — I5032 Chronic diastolic (congestive) heart failure: Secondary | ICD-10-CM | POA: Diagnosis not present

## 2016-05-16 DIAGNOSIS — K219 Gastro-esophageal reflux disease without esophagitis: Secondary | ICD-10-CM | POA: Diagnosis not present

## 2016-05-16 DIAGNOSIS — Z794 Long term (current) use of insulin: Secondary | ICD-10-CM | POA: Insufficient documentation

## 2016-05-16 DIAGNOSIS — Z9049 Acquired absence of other specified parts of digestive tract: Secondary | ICD-10-CM | POA: Insufficient documentation

## 2016-05-16 DIAGNOSIS — K222 Esophageal obstruction: Secondary | ICD-10-CM | POA: Diagnosis not present

## 2016-05-16 DIAGNOSIS — I110 Hypertensive heart disease with heart failure: Secondary | ICD-10-CM | POA: Insufficient documentation

## 2016-05-16 DIAGNOSIS — I509 Heart failure, unspecified: Secondary | ICD-10-CM | POA: Diagnosis not present

## 2016-05-16 DIAGNOSIS — Z7901 Long term (current) use of anticoagulants: Secondary | ICD-10-CM | POA: Diagnosis not present

## 2016-05-16 DIAGNOSIS — Z79899 Other long term (current) drug therapy: Secondary | ICD-10-CM | POA: Diagnosis not present

## 2016-05-16 DIAGNOSIS — R1310 Dysphagia, unspecified: Secondary | ICD-10-CM

## 2016-05-16 DIAGNOSIS — B3781 Candidal esophagitis: Secondary | ICD-10-CM

## 2016-05-16 DIAGNOSIS — E119 Type 2 diabetes mellitus without complications: Secondary | ICD-10-CM | POA: Insufficient documentation

## 2016-05-16 DIAGNOSIS — Z8543 Personal history of malignant neoplasm of ovary: Secondary | ICD-10-CM | POA: Diagnosis not present

## 2016-05-16 DIAGNOSIS — E039 Hypothyroidism, unspecified: Secondary | ICD-10-CM | POA: Diagnosis not present

## 2016-05-16 DIAGNOSIS — I4891 Unspecified atrial fibrillation: Secondary | ICD-10-CM | POA: Diagnosis not present

## 2016-05-16 DIAGNOSIS — K2970 Gastritis, unspecified, without bleeding: Secondary | ICD-10-CM | POA: Diagnosis not present

## 2016-05-16 HISTORY — PX: ESOPHAGOGASTRODUODENOSCOPY (EGD) WITH PROPOFOL: SHX5813

## 2016-05-16 LAB — KOH PREP

## 2016-05-16 LAB — GLUCOSE, CAPILLARY: GLUCOSE-CAPILLARY: 133 mg/dL — AB (ref 65–99)

## 2016-05-16 SURGERY — ESOPHAGOGASTRODUODENOSCOPY (EGD) WITH PROPOFOL
Anesthesia: General

## 2016-05-16 MED ORDER — PROPOFOL 500 MG/50ML IV EMUL
INTRAVENOUS | Status: DC | PRN
  Administered 2016-05-16: 100 ug/kg/min via INTRAVENOUS

## 2016-05-16 MED ORDER — SODIUM CHLORIDE 0.9 % IV SOLN
INTRAVENOUS | Status: DC
  Administered 2016-05-16: 1000 mL via INTRAVENOUS

## 2016-05-16 MED ORDER — LIDOCAINE HCL (PF) 2 % IJ SOLN
INTRAMUSCULAR | Status: AC
Start: 2016-05-16 — End: 2016-05-16
  Filled 2016-05-16: qty 2

## 2016-05-16 MED ORDER — PROPOFOL 10 MG/ML IV BOLUS
INTRAVENOUS | Status: AC
  Filled 2016-05-16: qty 20

## 2016-05-16 MED ORDER — GLYCOPYRROLATE 0.2 MG/ML IJ SOLN
INTRAMUSCULAR | Status: DC | PRN
Start: 2016-05-16 — End: 2016-05-16
  Administered 2016-05-16: 0.2 mg via INTRAVENOUS

## 2016-05-16 MED ORDER — PROPOFOL 10 MG/ML IV BOLUS
INTRAVENOUS | Status: DC | PRN
  Administered 2016-05-16: 50 mg via INTRAVENOUS

## 2016-05-16 MED ORDER — LIDOCAINE HCL (CARDIAC) 20 MG/ML IV SOLN
INTRAVENOUS | Status: DC | PRN
  Administered 2016-05-16: 60 mg via INTRAVENOUS

## 2016-05-16 MED ORDER — GLYCOPYRROLATE 0.2 MG/ML IJ SOLN
INTRAMUSCULAR | Status: AC
  Filled 2016-05-16: qty 1

## 2016-05-16 NOTE — Anesthesia Post-op Follow-up Note (Cosign Needed)
Anesthesia QCDR form completed.        

## 2016-05-16 NOTE — Op Note (Signed)
[facility] Gastroenterology Patient Name: Jodi Reeves Procedure Date: 05/16/2016 9:35 AM MRN: [id number] Account #: [id number] Date of Birth: 1928/01/13 Admit Type: Outpatient Age: 83 Room: [facility] ENDO ROOM 4 Gender: Female Note Status: Finalized Procedure:            Upper GI endoscopy Indications:          Dysphagia Providers:            Lucilla Lame MD, MD Referring MD:         Terance Hart. Tobin Chad (Referring MD) Medicines:            Propofol per Anesthesia Complications:        No immediate complications. Procedure:            Pre-Anesthesia Assessment:                       - Prior to the procedure, a History and Physical was                        performed, and patient medications and allergies were                        reviewed. The patient's tolerance of previous                        anesthesia was also reviewed. The risks and benefits of                        the procedure and the sedation options and risks were                        discussed with the patient. All questions were                        answered, and informed consent was obtained. Prior                        Anticoagulants: The patient has taken no previous                        anticoagulant or antiplatelet agents. ASA Grade                        Assessment: II - A patient with mild systemic disease.                        After reviewing the risks and benefits, the patient was                        deemed in satisfactory condition to undergo the                        procedure.                       After obtaining informed consent, the endoscope was                        passed under direct vision. Throughout the procedure,  the patient's blood pressure, pulse, and oxygen                        saturations were monitored continuously. The Endoscope                        was introduced through the mouth, and advanced to the                        second  part of duodenum. The upper GI endoscopy was                        accomplished without difficulty. The patient tolerated                        the procedure well. Findings:      Patchy candidiasis was found in the entire esophagus. Brushings were       obtained.      One mild benign-appearing, intrinsic stenosis was found at the       gastroesophageal junction. And was traversed. A TTS dilator was passed       through the scope. Dilation with a 15-16.5-18 mm balloon dilator was       performed to 18 mm. The dilation site was examined following endoscope       reinsertion and showed complete resolution of luminal narrowing.      The stomach was normal.      The examined duodenum was normal. Impression:           - Monilial esophagitis. Brushings performed.                       - Benign-appearing esophageal stenosis. Dilated.                       - Normal stomach.                       - Normal examined duodenum. Recommendation:       - Discharge patient to home.                       - Resume previous diet.                       - Continue present medications. Procedure Code(s):    --- Professional ---                       [phone], Esophagogastroduodenoscopy, flexible, transoral;                        with transendoscopic balloon dilation of esophagus                        (less than 30 mm diameter) Diagnosis Code(s):    --- Professional ---                       R13.10, Dysphagia, unspecified                       B37.81, Candidal esophagitis  K22.2, Esophageal obstruction CPT copyright 2016 American Medical Association. All rights reserved. The codes documented in this report are preliminary and upon coder review may  be revised to meet current compliance requirements. Lucilla Lame MD, MD 05/16/2016 9:51:09 AM This report has been signed electronically. Number of Addenda: 0 Note Initiated On: 05/16/2016 9:35 AM      [facility]

## 2016-05-16 NOTE — Anesthesia Preprocedure Evaluation (Signed)
Anesthesia Evaluation  Patient identified by MRN, date of birth, ID band Patient awake    Reviewed: Allergy & Precautions, NPO status , Patient's Chart, lab work & pertinent test results  History of Anesthesia Complications Negative for: history of anesthetic complications  Airway Mallampati: II  TM Distance: >3 FB Neck ROM: Full    Dental  (+) Poor Dentition   Pulmonary neg sleep apnea, neg COPD,    breath sounds clear to auscultation- rhonchi (-) wheezing      Cardiovascular hypertension, Pt. on medications +CHF (preserved EF)  (-) CAD and (-) Past MI  Rhythm:Regular Rate:Normal - Systolic murmurs and - Diastolic murmurs Echo [id number]:  Left ventricular hypertrophy - mild  Normal left ventricular systolic function, ejection fraction 65 to [id number]  Diastolic dysfunction - grade II (elevated filling pressures)  Dilated left atrium - moderate  Degenerative mitral valve disease  Mitral annular calcification  Aortic sclerosis  Elevated pulmonary artery systolic pressure - borderline  Normal right ventricular systolic function  Tricuspid regurgitation - mild to moderate  Dilated right atrium - mild   Neuro/Psych Anxiety TIA   GI/Hepatic Neg liver ROS, GERD  ,  Endo/Other  diabetes, Type 2, Oral Hypoglycemic AgentsHypothyroidism   Renal/GU negative Renal ROS     Musculoskeletal negative musculoskeletal ROS (+)   Abdominal (+) - obese,   Peds  Hematology negative hematology ROS (+)   Anesthesia Other Findings Past Medical History: No date: Anxiety No date: Atrial fibrillation (HCC) No date: Atrial fibrillation (HCC) No date: Balance disorder No date: DM2 (diabetes mellitus, type 2) (HCC) No date: Edema, lower extremity No date: GERD (gastroesophageal reflux disease)     Comment: esophageal web No date: Granuloma annulare     Comment: [name].  No date: HLD (hyperlipidemia) No date: HTN  (hypertension) No date: Hypothyroidism No date: Incontinence of bowel No date: Osteoporosis No date: Ovarian cancer ([place]) No date: TIA (transient ischemic attack)   Reproductive/Obstetrics                             Anesthesia Physical Anesthesia Plan  ASA: III  Anesthesia Plan: General   Post-op Pain Management:    Induction: Intravenous  Airway Management Planned: Natural Airway  Additional Equipment:   Intra-op Plan:   Post-operative Plan:   Informed Consent: I have reviewed the patients History and Physical, chart, labs and discussed the procedure including the risks, benefits and alternatives for the proposed anesthesia with the patient or authorized representative who has indicated his/her understanding and acceptance.   Dental advisory given  Plan Discussed with: CRNA and Anesthesiologist  Anesthesia Plan Comments:         Anesthesia Quick Evaluation

## 2016-05-16 NOTE — Transfer of Care (Signed)
Immediate Anesthesia Transfer of Care Note  Patient: Jodi Reeves  Procedure(s) Performed: Procedure(s): ESOPHAGOGASTRODUODENOSCOPY (EGD) WITH PROPOFOL with dilation (N/A)  Patient Location: PACU  Anesthesia Type:General  Level of Consciousness: sedated and responds to stimulation  Airway & Oxygen Therapy: Patient Spontanous Breathing and Patient connected to nasal cannula oxygen  Post-op Assessment: Report given to RN and Post -op Vital signs reviewed and stable  Post vital signs: Reviewed and stable  Last Vitals:  Vitals:   05/16/16 0855 05/16/16 0951  BP: 123/77 108/64  Pulse: 79 73  Resp: 16 (!) 26  Temp: 36.9 C     Last Pain:  Vitals:   05/16/16 0855  TempSrc: Tympanic         Complications: No apparent anesthesia complications

## 2016-05-16 NOTE — Anesthesia Procedure Notes (Signed)
Performed by: Dashana Guizar Pre-anesthesia Checklist: Patient identified, Emergency Drugs available, Suction available, Patient being monitored and Timeout performed Patient Re-evaluated:Patient Re-evaluated prior to inductionOxygen Delivery Method: Nasal cannula Preoxygenation: Pre-oxygenation with 100% oxygen Intubation Type: IV induction       

## 2016-05-16 NOTE — Anesthesia Postprocedure Evaluation (Signed)
Anesthesia Post Note  Patient: Jodi Reeves  Procedure(s) Performed: Procedure(s) (LRB): ESOPHAGOGASTRODUODENOSCOPY (EGD) WITH PROPOFOL with dilation (N/A)  Patient location during evaluation: Endoscopy Anesthesia Type: General Level of consciousness: awake and alert and oriented Pain management: pain level controlled Vital Signs Assessment: post-procedure vital signs reviewed and stable Respiratory status: spontaneous breathing, nonlabored ventilation and respiratory function stable Cardiovascular status: blood pressure returned to baseline and stable Postop Assessment: no signs of nausea or vomiting Anesthetic complications: no     Last Vitals:  Vitals:   05/16/16 1010 05/16/16 1020  BP: 127/62 130/84  Pulse: 68 73  Resp: 17 16  Temp:      Last Pain:  Vitals:   05/16/16 0951  TempSrc: Tympanic                 Amily Depp

## 2016-05-16 NOTE — H&P (Signed)
Lucilla Lame, MD [address] [place], [place] [address] Phone: [phone] Fax : [phone]  Primary Care Physician:  Lavonne Chick, MD Primary Gastroenterologist:  [name]  Pre-Procedure History & Physical: HPI:  Jodi Reeves is a 83 y.o. female is here for an endoscopy.   Past Medical History:  Diagnosis Date  . Anxiety   . Atrial fibrillation ([place])   . Atrial fibrillation ([place])   . Balance disorder   . DM2 (diabetes mellitus, type 2) ([place])   . Edema, lower extremity   . GERD (gastroesophageal reflux disease)    esophageal web  . Granuloma annulare    [name].   Marland Kitchen HLD (hyperlipidemia)   . HTN (hypertension)   . Hypothyroidism   . Incontinence of bowel   . Osteoporosis   . Ovarian cancer ([place])   . TIA (transient ischemic attack)     Past Surgical History:  Procedure Laterality Date  . 2nd look laparotomy  1982  . ABDOMINAL HYSTERECTOMY    . CARDIAC CATHETERIZATION  8/05   neg; a fib found   . cataract OD  2002  . cataract surgery  5/07   R  . CHOLECYSTECTOMY  1986  . DEXA  9/04 and 1/02  . EGD/dilation/colon  1/06  . FEMORAL HERNIA REPAIR  1958   R  . fracture L elbow/wrist  2000  . intussception/obstruction  12/98  . kidney stone x2  1991  . laminectomy L4-5  1971  . LUMBAR LAMINECTOMY    . MOUTH SURGERY    . myoview stress  8/05   syncope (-)  . REVERSE SHOULDER ARTHROPLASTY Left 10/06/2014   Procedure: REVERSE SHOULDER ARTHROPLASTY;  Surgeon: Corky Mull, MD;  Location: [facility] ORS;  Service: Orthopedics;  Laterality: Left;  . stillbirth  1969  . TOTAL ABDOMINAL HYSTERECTOMY W/ BILATERAL SALPINGOOPHORECTOMY  1980   ovarian cancer  . WRIST FRACTURE SURGERY  11/05   R    Prior to Admission medications   Medication Sig Start Date End Date Taking? Authorizing Provider  Ascorbic Acid (VITAMIN C) 1000 MG tablet Take 1,000 mg by mouth daily.     Yes Historical Provider, MD  BD PEN NEEDLE NANO U/F 32G X 4 MM MISC Inject 1  Syringe as directed as needed.  04/28/11  Yes Historical Provider, MD  bisoprolol-hydrochlorothiazide (ZIAC) 2.5-6.25 MG tablet Take 1 tablet by mouth daily. 12/21/15 12/20/16 Yes Minna Merritts, MD  CALCIUM & MAGNESIUM CARBONATES PO Take 2 capsules by mouth daily.    Yes Historical Provider, MD  Cholecalciferol (VITAMIN D3) 2000 UNITS TABS Take by mouth daily.   Yes Historical Provider, MD  co-enzyme Q-10 30 MG capsule Take 30 mg by mouth 3 (three) times daily.   Yes Historical Provider, MD  digoxin (LANOXIN) 0.125 MG tablet TAKE ONE (1) TABLET EACH DAY 06/14/15  Yes Venia Carbon, MD  famotidine (PEPCID) 20 MG tablet Take 20 mg by mouth 2 (two) times daily.  04/20/16 05/20/16 Yes Historical Provider, MD  fluorometholone (FML) 0.1 % ophthalmic suspension Place 1 drop into both eyes as needed.  01/23/13  Yes Historical Provider, MD  Folic Acid-Vit Q000111Q [id number] (FA-VITAMIN B-6-VITAMIN B-12) 2.2-25-0.5 MG TABS Place under the tongue daily.     Yes Historical Provider, MD  furosemide (LASIX) 20 MG tablet Take 1 tablet (20 mg total) by mouth 2 (two) times daily as needed. 06/11/14  Yes Minna Merritts, MD  glipiZIDE (GLUCOTROL XL) 2.5 MG 24 hr  tablet Take 2.5 mg by mouth daily with breakfast.   Yes Historical Provider, MD  glucose blood (ONE TOUCH ULTRA TEST) test strip Use to test blood sugar once daily E11.49 06/24/15  Yes [name], MD  insulin NPH Human (HUMULIN N,NOVOLIN N) 100 UNIT/ML injection Inject 16 Units into the skin at bedtime.    Yes Historical Provider, MD  levothyroxine (SYNTHROID, LEVOTHROID) 50 MCG tablet Take 50 mcg by mouth daily before breakfast.   Yes Historical Provider, MD  metFORMIN (GLUCOPHAGE) 500 MG tablet Take 500 mg by mouth 2 (two) times daily.   Yes Historical Provider, MD  Multiple Vitamin (MULTIVITAMIN) capsule Take 1 capsule by mouth daily.   Yes Historical Provider, MD  Omega-3 Fatty Acids (OMEGA-3 FISH OIL PO) Take by mouth.   Yes Historical Provider, MD  potassium  chloride (K-DUR) 10 MEQ tablet Take 1 tablet (10 mEq total) by mouth daily as needed. 12/21/14  Yes Minna Merritts, MD  XARELTO 20 MG TABS tablet TAKE ONE (1) TABLET BY MOUTH EVERY DAY 11/16/15  Yes Minna Merritts, MD    Allergies as of 05/05/2016 - Review Complete 05/02/2016  Allergen Reaction Noted  . Alendronate sodium    . Atorvastatin    . Ciprofloxacin Other (See Comments) 09/17/2015  . Codeine    . Ezetimibe-simvastatin    . Fluticasone Other (See Comments) 03/21/2016  . Lovastatin    . Penicillins    . Rosiglitazone maleate    . Sulfamethoxazole-trimethoprim Rash 09/17/2015    Family History  Problem Relation Age of Onset  . Family history unknown: Yes    Social History   Social History  . Marital status: Widowed    Spouse name: N/A  . Number of children: 1  . Years of education: N/A   Occupational History  . 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 Smoking status: Never Smoker  . Smokeless tobacco: Never Used  . Alcohol use No     Comment: wine occ.  . Drug use: No  . Sexual activity: Not on file   Other Topics Concern  . Not on file   Social History Narrative   Has living will   Son is healthcare POA   Would accept CPR but not prolonged artificial ventilation    No feeding tube if cognitively unaware    Review of Systems: See HPI, otherwise negative ROS  Physical Exam: BP 123/77   Pulse 79   Temp 98.4 F (36.9 C) (Tympanic)   Resp 16   Ht 5\' 4"  (1.626 m)   Wt 166 lb (75.3 kg)   SpO2 99%   BMI 28.49 kg/m  General:   Alert,  pleasant and cooperative in NAD Head:  Normocephalic and atraumatic. Neck:  Supple; no masses or thyromegaly. Lungs:  Clear throughout to auscultation.    Heart:  Regular rate and rhythm. Abdomen:  Soft, nontender and nondistended. Normal bowel sounds, without guarding, and without rebound.   Neurologic:  Alert and  oriented x4;  grossly normal neurologically.  Impression/Plan: Jodi Reeves is  here for an endoscopy to be performed for Dysphagia  Risks, benefits, limitations, and alternatives regarding  endoscopy have been reviewed with the patient.  Questions have been answered.  All parties agreeable.   Lucilla Lame, MD  05/16/2016, 9:29 AM

## 2016-05-18 ENCOUNTER — Encounter: Payer: Self-pay | Admitting: Gastroenterology

## 2016-05-19 ENCOUNTER — Telehealth: Payer: Self-pay

## 2016-05-19 DIAGNOSIS — C44519 Basal cell carcinoma of skin of other part of trunk: Secondary | ICD-10-CM | POA: Diagnosis not present

## 2016-05-19 DIAGNOSIS — L821 Other seborrheic keratosis: Secondary | ICD-10-CM | POA: Diagnosis not present

## 2016-05-19 DIAGNOSIS — X32XXXA Exposure to sunlight, initial encounter: Secondary | ICD-10-CM | POA: Diagnosis not present

## 2016-05-19 DIAGNOSIS — D485 Neoplasm of uncertain behavior of skin: Secondary | ICD-10-CM | POA: Diagnosis not present

## 2016-05-19 DIAGNOSIS — L570 Actinic keratosis: Secondary | ICD-10-CM | POA: Diagnosis not present

## 2016-05-19 NOTE — Telephone Encounter (Signed)
-----   Message from Lucilla Lame, MD sent at 05/16/2016  1:02 PM EST ----- Let the patient know that her brushings showed her to have a yeast infection in her esophagus. Please start her on Diflucan 100 mg by mouth for 2 weeks

## 2016-05-19 NOTE — Telephone Encounter (Signed)
LVM for pt to return my call.

## 2016-05-22 ENCOUNTER — Ambulatory Visit (INDEPENDENT_AMBULATORY_CARE_PROVIDER_SITE_OTHER): Payer: Medicare HMO | Admitting: Podiatry

## 2016-05-22 ENCOUNTER — Other Ambulatory Visit: Payer: Self-pay

## 2016-05-22 ENCOUNTER — Encounter: Payer: Self-pay | Admitting: Podiatry

## 2016-05-22 DIAGNOSIS — B351 Tinea unguium: Secondary | ICD-10-CM

## 2016-05-22 DIAGNOSIS — M79676 Pain in unspecified toe(s): Secondary | ICD-10-CM

## 2016-05-22 MED ORDER — FLUCONAZOLE 100 MG PO TABS: 100.0000 mg | ORAL_TABLET | Freq: Every day | ORAL | 0 refills | Status: DC

## 2016-05-22 NOTE — Telephone Encounter (Signed)
Patient is returning your call and also has an appointment in [place] today

## 2016-05-22 NOTE — Telephone Encounter (Signed)
Pt notified of results and rx sent to [facility].

## 2016-05-22 NOTE — Progress Notes (Signed)
Complaint:  Visit Type: Patient returns to my office for continued preventative foot care services. Complaint: Patient states" my nails have grown long and thick and become painful to walk and wear shoes" Patient has been diagnosed with DM with neuropathy.. The patient presents for preventative foot care services. No changes to ROS  Podiatric Exam: Vascular: dorsalis pedis and posterior tibial pulses are palpable bilateral. Capillary return is immediate. Temperature gradient is WNL. Skin turgor WNL  Sensorium: Diminished  Semmes Weinstein monofilament test. Normal tactile sensation bilaterally. Nail Exam: Pt has thick disfigured discolored nails with subungual debris noted bilateral entire nail hallux through fifth toenails Ulcer Exam: There is no evidence of ulcer or pre-ulcerative changes or infection. Orthopedic Exam: Muscle tone and strength are WNL. No limitations in general ROM. No crepitus or effusions noted. Foot type and digits show no abnormalities. Bony prominences are unremarkable. Skin: No Porokeratosis. No infection or ulcers  Diagnosis:  Onychomycosis, , Pain in right toe, pain in left toes  Treatment & Plan Procedures and Treatment: Consent by patient was obtained for treatment procedures. The patient understood the discussion of treatment and procedures well. All questions were answered thoroughly reviewed. Debridement of mycotic and hypertrophic toenails, 1 through 5 bilateral and clearing of subungual debris. No ulceration, no infection noted.  Return Visit-Office Procedure: Patient instructed to return to the office for a follow up visit 3 months for continued evaluation and treatment.    Yarisbel Miranda DPM 

## 2016-05-31 ENCOUNTER — Telehealth: Payer: Self-pay | Admitting: Cardiovascular Disease

## 2016-05-31 NOTE — Telephone Encounter (Signed)
Patient calling the office for samples of medication: 1.  What medication and dosage are you requesting samples for?  xarelto 20 mg   2.  Are you currently out of this medication?   She has enough until the end of the week   Pt is also asking if we can call her back Stating Dr Rockey Situ doesn't often let patient stay on Omega 3's  She is about to reorder it would like to know if that is okay for her to do Please advise

## 2016-06-01 NOTE — Telephone Encounter (Signed)
Please advise 

## 2016-06-01 NOTE — Addendum Note (Signed)
Addended by: Stana Bunting on: 06/01/2016 05:47 PM   Modules accepted: Orders

## 2016-06-01 NOTE — Telephone Encounter (Signed)
I would stop the fish oil, this can act as a blood thinner The other supplement sound okay

## 2016-06-01 NOTE — Telephone Encounter (Signed)
Left detailed message on pt's vm (per DPR) w/ Dr. Donivan Scull recommendation.  Asked her to call back w/ any questions or concerns.

## 2016-06-01 NOTE — Telephone Encounter (Signed)
Spoke w/ pt.  Advised her that I am leaving her samples of Xarelto 20 mg at the front desk for her to p/u at her convenience.  She would like to know if she should continue Omega-3 fatty acids, as it was discussed during her ov that Dr. Rockey Situ did not usually prefer this, but she was not specifically told to stop it.  She would also like to know if she should continue her calcium & magnesium carbonate supplement, or if she should be on a different type of calcium. She is going to order her mail order meds today and would like to know if she should stay on these supplements.

## 2016-06-10 ENCOUNTER — Other Ambulatory Visit: Payer: Self-pay | Admitting: Internal Medicine

## 2016-06-13 DIAGNOSIS — Z794 Long term (current) use of insulin: Secondary | ICD-10-CM | POA: Diagnosis not present

## 2016-06-13 DIAGNOSIS — E1129 Type 2 diabetes mellitus with other diabetic kidney complication: Secondary | ICD-10-CM | POA: Diagnosis not present

## 2016-06-13 DIAGNOSIS — R809 Proteinuria, unspecified: Secondary | ICD-10-CM | POA: Diagnosis not present

## 2016-06-13 DIAGNOSIS — E039 Hypothyroidism, unspecified: Secondary | ICD-10-CM | POA: Diagnosis not present

## 2016-06-13 DIAGNOSIS — E11649 Type 2 diabetes mellitus with hypoglycemia without coma: Secondary | ICD-10-CM | POA: Diagnosis not present

## 2016-06-19 ENCOUNTER — Ambulatory Visit: Payer: Medicare HMO | Admitting: Cardiovascular Disease

## 2016-06-20 DIAGNOSIS — Z794 Long term (current) use of insulin: Secondary | ICD-10-CM | POA: Diagnosis not present

## 2016-06-20 DIAGNOSIS — I10 Essential (primary) hypertension: Secondary | ICD-10-CM | POA: Diagnosis not present

## 2016-06-20 DIAGNOSIS — E11649 Type 2 diabetes mellitus with hypoglycemia without coma: Secondary | ICD-10-CM | POA: Diagnosis not present

## 2016-06-20 DIAGNOSIS — R809 Proteinuria, unspecified: Secondary | ICD-10-CM | POA: Diagnosis not present

## 2016-06-20 DIAGNOSIS — E039 Hypothyroidism, unspecified: Secondary | ICD-10-CM | POA: Diagnosis not present

## 2016-06-20 DIAGNOSIS — E1129 Type 2 diabetes mellitus with other diabetic kidney complication: Secondary | ICD-10-CM | POA: Diagnosis not present

## 2016-06-23 DIAGNOSIS — C44719 Basal cell carcinoma of skin of left lower limb, including hip: Secondary | ICD-10-CM | POA: Diagnosis not present

## 2016-06-26 DIAGNOSIS — I5032 Chronic diastolic (congestive) heart failure: Secondary | ICD-10-CM | POA: Diagnosis not present

## 2016-06-26 DIAGNOSIS — R2689 Other abnormalities of gait and mobility: Secondary | ICD-10-CM | POA: Diagnosis not present

## 2016-06-26 DIAGNOSIS — E1149 Type 2 diabetes mellitus with other diabetic neurological complication: Secondary | ICD-10-CM | POA: Diagnosis not present

## 2016-06-26 DIAGNOSIS — Z7901 Long term (current) use of anticoagulants: Secondary | ICD-10-CM | POA: Diagnosis not present

## 2016-06-26 DIAGNOSIS — E039 Hypothyroidism, unspecified: Secondary | ICD-10-CM | POA: Diagnosis not present

## 2016-06-26 DIAGNOSIS — Z9181 History of falling: Secondary | ICD-10-CM | POA: Diagnosis not present

## 2016-06-26 DIAGNOSIS — K5900 Constipation, unspecified: Secondary | ICD-10-CM | POA: Diagnosis not present

## 2016-06-26 DIAGNOSIS — R1310 Dysphagia, unspecified: Secondary | ICD-10-CM | POA: Diagnosis not present

## 2016-06-26 DIAGNOSIS — I110 Hypertensive heart disease with heart failure: Secondary | ICD-10-CM | POA: Diagnosis not present

## 2016-06-26 DIAGNOSIS — Z794 Long term (current) use of insulin: Secondary | ICD-10-CM | POA: Diagnosis not present

## 2016-06-26 DIAGNOSIS — I700 Atherosclerosis of aorta: Secondary | ICD-10-CM | POA: Diagnosis not present

## 2016-06-26 DIAGNOSIS — I4891 Unspecified atrial fibrillation: Secondary | ICD-10-CM | POA: Diagnosis not present

## 2016-06-26 DIAGNOSIS — Z0000 Encounter for general adult medical examination without abnormal findings: Secondary | ICD-10-CM | POA: Diagnosis not present

## 2016-07-04 ENCOUNTER — Other Ambulatory Visit: Payer: Self-pay | Admitting: *Deleted

## 2016-07-04 ENCOUNTER — Telehealth: Payer: Self-pay | Admitting: Cardiovascular Disease

## 2016-07-04 MED ORDER — POTASSIUM CHLORIDE ER 10 MEQ PO TBCR: 10.0000 meq | EXTENDED_RELEASE_TABLET | Freq: Every day | ORAL | 3 refills | Status: DC | PRN

## 2016-07-04 NOTE — Telephone Encounter (Signed)
°*  STAT* If patient is at the pharmacy, call can be transferred to refill team.   1. Which medications need to be refilled? (please list name of each medication and dose if known)   Potasium   2. Which pharmacy/location (including street and city if local pharmacy) is medication to be sent to?  [facility]   3. Do they need a 30 day or 90 day supply?   30 day

## 2016-07-04 NOTE — Telephone Encounter (Signed)
Requested Prescriptions   Signed Prescriptions Disp Refills  . potassium chloride (K-DUR) 10 MEQ tablet 30 tablet 3    Sig: Take 1 tablet (10 mEq total) by mouth daily as needed.    Authorizing Provider: Minna Merritts    Ordering User: Britt Bottom

## 2016-08-04 ENCOUNTER — Telehealth: Payer: Self-pay | Admitting: Cardiovascular Disease

## 2016-08-04 NOTE — Telephone Encounter (Signed)
Patient calling the office for samples of medication:   1.  What medication and dosage are you requesting samples for? XARELTO 20 MG  2.  Are you currently out of this medication?  Has enough to last a week

## 2016-08-04 NOTE — Telephone Encounter (Signed)
Patient aware of samples up front of Xarelto 20 MG 4 bottles Lot number [id number]  Exp. 4/20

## 2016-08-05 DIAGNOSIS — R69 Illness, unspecified: Secondary | ICD-10-CM | POA: Diagnosis not present

## 2016-08-07 ENCOUNTER — Ambulatory Visit (INDEPENDENT_AMBULATORY_CARE_PROVIDER_SITE_OTHER): Payer: Medicare HMO | Admitting: Podiatry

## 2016-08-07 ENCOUNTER — Encounter: Payer: Self-pay | Admitting: Podiatry

## 2016-08-07 DIAGNOSIS — M79676 Pain in unspecified toe(s): Secondary | ICD-10-CM | POA: Diagnosis not present

## 2016-08-07 DIAGNOSIS — E1140 Type 2 diabetes mellitus with diabetic neuropathy, unspecified: Secondary | ICD-10-CM

## 2016-08-07 DIAGNOSIS — B351 Tinea unguium: Secondary | ICD-10-CM | POA: Diagnosis not present

## 2016-08-07 DIAGNOSIS — E1149 Type 2 diabetes mellitus with other diabetic neurological complication: Secondary | ICD-10-CM | POA: Diagnosis not present

## 2016-08-07 NOTE — Progress Notes (Addendum)
Complaint:  Visit Type: Patient returns to my office for continued preventative foot care services. Complaint: Patient states" my nails have grown long and thick and become painful to walk and wear shoes" Patient has been diagnosed with DM with neuropathy.. The patient presents for preventative foot care services. Previous history left shoulder injury and rehab.  Podiatric Exam: Vascular: dorsalis pedis and posterior tibial pulses are palpable bilateral. Capillary return is immediate. Temperature gradient is WNL. Skin turgor WNL  Sensorium: Diminished  Semmes Weinstein monofilament test. Normal tactile sensation bilaterally. Nail Exam: Pt has thick disfigured discolored nails with subungual debris noted bilateral entire nail hallux through fifth toenails Ulcer Exam: There is no evidence of ulcer or pre-ulcerative changes or infection. Orthopedic Exam: Muscle tone and strength are WNL. No limitations in general ROM. No crepitus or effusions noted. Foot type and digits show no abnormalities. Bony prominences are unremarkable. Skin: No Porokeratosis. No infection or ulcers  Diagnosis:  Onychomycosis, , Pain in right toe, pain in left toes  Treatment & Plan Procedures and Treatment: Consent by patient was obtained for treatment procedures. The patient understood the discussion of treatment and procedures well. All questions were answered thoroughly reviewed. Debridement of mycotic and hypertrophic toenails, 1 through 5 bilateral and clearing of subungual debris. No ulceration, no infection noted. Patient is not interested in diabetic shoes. Return Visit-Office Procedure: Patient instructed to return to the office for a follow up visit 3 months for continued evaluation and treatment.    Gardiner Barefoot DPM

## 2016-09-07 ENCOUNTER — Other Ambulatory Visit: Payer: Self-pay | Admitting: Internal Medicine

## 2016-09-12 ENCOUNTER — Other Ambulatory Visit: Payer: Self-pay | Admitting: Cardiovascular Disease

## 2016-09-12 NOTE — Telephone Encounter (Signed)
Ok to refill Digoxin 0.125 mg tablet?

## 2016-09-13 DIAGNOSIS — R809 Proteinuria, unspecified: Secondary | ICD-10-CM | POA: Diagnosis not present

## 2016-09-13 DIAGNOSIS — E1129 Type 2 diabetes mellitus with other diabetic kidney complication: Secondary | ICD-10-CM | POA: Diagnosis not present

## 2016-09-13 DIAGNOSIS — Z794 Long term (current) use of insulin: Secondary | ICD-10-CM | POA: Diagnosis not present

## 2016-09-19 DIAGNOSIS — E1142 Type 2 diabetes mellitus with diabetic polyneuropathy: Secondary | ICD-10-CM | POA: Diagnosis not present

## 2016-09-19 DIAGNOSIS — R809 Proteinuria, unspecified: Secondary | ICD-10-CM | POA: Diagnosis not present

## 2016-09-19 DIAGNOSIS — Z794 Long term (current) use of insulin: Secondary | ICD-10-CM | POA: Diagnosis not present

## 2016-09-19 DIAGNOSIS — E039 Hypothyroidism, unspecified: Secondary | ICD-10-CM | POA: Diagnosis not present

## 2016-09-19 DIAGNOSIS — E1129 Type 2 diabetes mellitus with other diabetic kidney complication: Secondary | ICD-10-CM | POA: Diagnosis not present

## 2016-09-19 DIAGNOSIS — I10 Essential (primary) hypertension: Secondary | ICD-10-CM | POA: Diagnosis not present

## 2016-09-26 DIAGNOSIS — H353131 Nonexudative age-related macular degeneration, bilateral, early dry stage: Secondary | ICD-10-CM | POA: Diagnosis not present

## 2016-09-26 DIAGNOSIS — E113293 Type 2 diabetes mellitus with mild nonproliferative diabetic retinopathy without macular edema, bilateral: Secondary | ICD-10-CM | POA: Diagnosis not present

## 2016-10-09 ENCOUNTER — Telehealth: Payer: Self-pay

## 2016-10-09 NOTE — Telephone Encounter (Signed)
Pt would like Xarelto samples. Please call.

## 2016-10-09 NOTE — Telephone Encounter (Signed)
Spoke w/ pt.  Advised her that I am leaving samples of Xarelto 20 mg at the front desk for her to p/u at her convenience.  She is appreciative and will call back w/ any further questions or concerns.

## 2016-10-18 DIAGNOSIS — L920 Granuloma annulare: Secondary | ICD-10-CM | POA: Diagnosis not present

## 2016-10-18 DIAGNOSIS — L570 Actinic keratosis: Secondary | ICD-10-CM | POA: Diagnosis not present

## 2016-10-18 DIAGNOSIS — S90122A Contusion of left lesser toe(s) without damage to nail, initial encounter: Secondary | ICD-10-CM | POA: Diagnosis not present

## 2016-10-18 DIAGNOSIS — L821 Other seborrheic keratosis: Secondary | ICD-10-CM | POA: Diagnosis not present

## 2016-10-18 DIAGNOSIS — X32XXXA Exposure to sunlight, initial encounter: Secondary | ICD-10-CM | POA: Diagnosis not present

## 2016-10-18 DIAGNOSIS — L728 Other follicular cysts of the skin and subcutaneous tissue: Secondary | ICD-10-CM | POA: Diagnosis not present

## 2016-11-03 NOTE — Progress Notes (Signed)
Cardiology Office Note  Date:  11/07/2016   ID:  Jodi Reeves, DOB [birth year], MRN [id number]  PCP:  Lavonne Chick, MD   Chief Complaint  Patient presents with  . other    6mo f/u. Pt concerned of leg swelling; would like you to check her right 2nd toe. Denies cp/sob. Reviewed meds with pt verbally.    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  CT chest 04/20/2016, Calcifications of the aortic arch, mild calcification of the coronary arteries. Moderate calcifications of the abdominal aorta, which is tortuous. Previous stretching of esophagus with Dr. Candace Cruise She presents for follow-up of her atrial fibrillation and chest pain  In follow-up today she reports having acute ecchymosis of left foot second toe Presented the day or so ago, significant swelling, bruising No significant tenderness, denies any trauma to the area Seem to happen spontaneously Has been going back and forth to Dr. visits in [place] to help a friend  Unsteady gait, uses a cane No recent falls No exercise program  Chronic GI issues Previously had diarrhea, now with constipation. Working with GI  Chronic leg swelling, venous Typically wears compression hose  Labs reviewed in detail  HBA1C 7.9  EKG personally reviewed by myself on todays visit Shows atrial fibrillation with ventricular rate 65 bpm no significant ST or T-wave changes  Other past medical history reviewed  Went to the ER 04/19/2016,  complaints of Chest pain, difficulty swallowing  sounded more esophageal per the notes several days of difficulty with swallowing food and medicine.  Her troponins returned negative, her EKGs with no changes CT scan of the chest performed, limited echocardiogram performed  On day  of discharge, she ate normal food and had no difficulty with dysphagia or pain. She was discharged home.  CT chest 04/20/2016: Calcifications of the aortic arch. mild calcification of the coronary arteries. Moderate calcifications of the abdominal aorta, which is tortuous. The origins of the celiac, SMA, single bilateral renal arteries are patent. The origin of the IMA is not seen, and may be occluded chronically.  Results of ER echo (limited) 04/19/2016 No sonographic evidence of significant cardiac dysfunction, No sonographic  evidence of significant pericardial effusion, Normal RV, No sonographic  evidence of volume depletion and Pericardial effusion:with a SMALL  Effusion  TIA in January 2017, went to [facility] for evaluation She was talking with a friend, had word finding difficulty, friend was finishing her sentences Workup at [facility] reviewed She had CT scan of her head which was essentially normal,  carotid ultrasound that showed mild bilateral disease, no significant stenoses,  echocardiogram with ejection fraction greater than 65%, mild valve abnormality  Previous total cholesterol 201, LDL 107, hemoglobin A1c 6.9  Diabetes managed by Dr. 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    PMH:   has  a past medical history of Anxiety; Atrial fibrillation ([place]); Atrial fibrillation ([place]); Balance disorder; DM2 (diabetes mellitus, type 2) ([place]); Edema, lower extremity; GERD (gastroesophageal reflux disease); Granuloma annulare; HLD (hyperlipidemia);  HTN (hypertension); Hypothyroidism; Incontinence of bowel; Osteoporosis; Ovarian cancer ([place]); and TIA (transient ischemic attack).  PSH:    Past Surgical History:  Procedure Laterality Date  . 2nd look laparotomy  1982  . ABDOMINAL HYSTERECTOMY    . CARDIAC CATHETERIZATION  8/05   neg; a fib found   . cataract OD  2002  . cataract surgery  5/07   R  . CHOLECYSTECTOMY  1986  . DEXA  9/04 and 1/02  . EGD/dilation/colon  1/06  . ESOPHAGOGASTRODUODENOSCOPY (EGD) WITH PROPOFOL N/A 05/16/2016   Procedure: ESOPHAGOGASTRODUODENOSCOPY (EGD) WITH PROPOFOL with dilation;  Surgeon: Lucilla Lame, MD;  Location: [facility] ENDOSCOPY;  Service: Endoscopy;  Laterality: N/A;  . FEMORAL HERNIA REPAIR  1958   R  . fracture L elbow/wrist  2000  . intussception/obstruction  12/98  . kidney stone x2  1991  . laminectomy L4-5  1971  . LUMBAR LAMINECTOMY    . MOUTH SURGERY    . myoview stress  8/05   syncope (-)  . REVERSE SHOULDER ARTHROPLASTY Left 10/06/2014   Procedure: REVERSE SHOULDER ARTHROPLASTY;  Surgeon: Corky Mull, MD;  Location: [facility] ORS;  Service: Orthopedics;  Laterality: Left;  . stillbirth  1969  . TOTAL ABDOMINAL HYSTERECTOMY W/ BILATERAL SALPINGOOPHORECTOMY  1980   ovarian cancer  . WRIST FRACTURE SURGERY  11/05   R    Current Outpatient Prescriptions  Medication Sig Dispense Refill  . Ascorbic Acid (VITAMIN C) 1000 MG tablet Take 1,000 mg by mouth daily.      . BD PEN NEEDLE NANO U/F 32G X 4 MM MISC Inject 1 Syringe as directed as needed.     . bisoprolol-hydrochlorothiazide (ZIAC) 2.5-6.25 MG tablet Take 1 tablet by mouth daily. 90 tablet 3  . Calcium-Magnesium-Vitamin D (CALCIUM 500 PO) Take 500 mg by mouth 2 (two) times daily.    . Cholecalciferol (VITAMIN D3) 2000 UNITS TABS Take by mouth daily.    Marland Kitchen co-enzyme Q-10 30 MG capsule Take 30 mg by mouth 3 (three) times daily.    . digoxin (LANOXIN) 0.125 MG tablet TAKE ONE (1) TABLET BY MOUTH EVERY DAY 90 tablet 3  . famotidine  (PEPCID) 20 MG tablet Take 20 mg by mouth as needed.    . fluorometholone (FML) 0.1 % ophthalmic suspension Place 1 drop into both eyes as needed.     . Folic Acid-Vit Q3-ESP Q33 (FA-VITAMIN B-6-VITAMIN B-12) 2.2-25-0.5 MG TABS Take by mouth daily.     . furosemide (LASIX) 20 MG tablet Take 1 tablet (20 mg total) by mouth 2 (two) times daily as needed. 180 tablet 3  . glipiZIDE (GLUCOTROL XL) 2.5 MG 24 hr tablet Take 2.5 mg by mouth daily with breakfast.    . glucose blood (ONE TOUCH ULTRA TEST) test strip Use to test blood sugar once daily E11.49 100 each 1  . insulin NPH Human (HUMULIN N,NOVOLIN N) 100 UNIT/ML injection Inject 16 Units into the skin at bedtime.     Marland Kitchen levothyroxine (SYNTHROID, LEVOTHROID) 50 MCG tablet Take 50 mcg by mouth daily before breakfast.    . MAGNESIUM OXIDE PO Take 125 mg by mouth 2 (two) times daily.    . metFORMIN (GLUCOPHAGE) 500 MG tablet Take 500 mg by mouth 2 (two) times daily.    . Multiple Vitamin (MULTIVITAMIN) capsule  Take 1 capsule by mouth daily.    . Omega-3 1000 MG CAPS Take 1,000 mg by mouth 2 (two) times daily.    . potassium chloride (K-DUR) 10 MEQ tablet Take 1 tablet (10 mEq total) by mouth daily as needed. 30 tablet 3  . XARELTO 20 MG TABS tablet TAKE ONE (1) TABLET BY MOUTH EVERY DAY 30 tablet 8   No current facility-administered medications for this visit.      Allergies:   Alendronate sodium; Atorvastatin; Ciprofloxacin; Codeine; Ezetimibe-simvastatin; Fluticasone; Lovastatin; Penicillins; Rosiglitazone maleate; and Sulfamethoxazole-trimethoprim   Social History:  The patient  reports that she has never smoked. She has never used smokeless tobacco. She reports that she does not drink alcohol or use drugs.   Family History:   Family history is unknown by patient.    Review of Systems: Review of Systems  Constitutional: Negative.   Respiratory: Negative.   Cardiovascular: Positive for leg swelling.  Gastrointestinal: Negative.         Dysphagia  Musculoskeletal: Negative.        Unsteady gait  Neurological: Negative.   Psychiatric/Behavioral: Negative.   All other systems reviewed and are negative.    PHYSICAL EXAM: VS:  BP (!) 142/78 (BP Location: Left Arm, Patient Position: Sitting, Cuff Size: Normal)   Pulse 65   Ht 5\' 3"  (1.6 m)   Wt 172 lb 12 oz (78.4 kg)   BMI 30.60 kg/m  , BMI Body mass index is 30.6 kg/m. GEN: Well nourished, well developed, in no acute distress, obese , Uses a cane  HEENT: normal  Neck: no JVD, carotid bruits, or masses Cardiac: Irregularly irregular, no murmurs, rubs, or gallops, nonpitting lower extremity edema  Respiratory:  clear to auscultation bilaterally, normal work of breathing GI: soft, nontender, nondistended, + BS MS: no deformity or atrophy  Skin: warm and dry, no rash, significant ecchymosis left foot second toe , moderate swelling  Neuro:  Strength and sensation are intact Psych: euthymic mood, full affect    Recent Labs: No results found for requested labs within last 8760 hours.    Lipid Panel Lab Results  Component Value Date   CHOL 209 (H) 02/24/2014   HDL 51.60 02/24/2014   LDLCALC 124 (H) 02/24/2014   TRIG 168.0 (H) 02/24/2014      Wt Readings from Last 3 Encounters:  11/07/16 172 lb 12 oz (78.4 kg)  05/16/16 166 lb (75.3 kg)  05/02/16 171 lb 8 oz (77.8 kg)       ASSESSMENT AND PLAN:   Mixed hyperlipidemia Discussed recent findings on her CT scan including mild coronary calcifications, aortic arch calcifications, moderate aortic atherosclerosis in the abdominal aorta She does not want a statin,  myalgias in the past  Essential hypertension, benign - Plan: EKG 12-Lead Blood pressure is well controlled on today's visit. No changes made to the medications.  Persistent atrial fibrillation (HCC) Heart rate well controlled, tolerating anticoagulation We will hold anticoagulation for 2 days given dramatic ecchymosis of left foot second  toe  Chronic diastolic CHF (congestive heart failure) (HCC) Worsening lower extremity edema after the holidays. Recommended she take Lasix twice a day for several days until edema improves Suggested she take this with extra potassium Recent normal renal function  Type 2 diabetes mellitus with neurological manifestations, controlled ([place]) We have encouraged continued exercise, careful diet management in an effort to lose weight.  Esophageal stricture Previous stretching Likely contributing to previous chest pain symptoms   Total encounter time more  than 25 minutes  Greater than 50% was spent in counseling and coordination of care with the patient   Disposition:   F/U  12 months   No orders of the defined types were placed in this encounter.    Signed, Esmond Plants, M.D., Ph.D. 11/07/2016  [place], [place]

## 2016-11-06 ENCOUNTER — Ambulatory Visit: Payer: Medicare HMO | Admitting: Podiatry

## 2016-11-07 ENCOUNTER — Encounter: Payer: Self-pay | Admitting: Cardiovascular Disease

## 2016-11-07 ENCOUNTER — Ambulatory Visit (INDEPENDENT_AMBULATORY_CARE_PROVIDER_SITE_OTHER): Payer: Medicare HMO | Admitting: Cardiovascular Disease

## 2016-11-07 VITALS — BP 142/78 | HR 65 | Ht 63.0 in | Wt 172.8 lb

## 2016-11-07 DIAGNOSIS — E782 Mixed hyperlipidemia: Secondary | ICD-10-CM | POA: Diagnosis not present

## 2016-11-07 DIAGNOSIS — I481 Persistent atrial fibrillation: Secondary | ICD-10-CM | POA: Diagnosis not present

## 2016-11-07 DIAGNOSIS — I10 Essential (primary) hypertension: Secondary | ICD-10-CM

## 2016-11-07 DIAGNOSIS — I5032 Chronic diastolic (congestive) heart failure: Secondary | ICD-10-CM | POA: Diagnosis not present

## 2016-11-07 DIAGNOSIS — I4819 Other persistent atrial fibrillation: Secondary | ICD-10-CM

## 2016-11-07 DIAGNOSIS — E1149 Type 2 diabetes mellitus with other diabetic neurological complication: Secondary | ICD-10-CM

## 2016-11-07 DIAGNOSIS — R69 Illness, unspecified: Secondary | ICD-10-CM | POA: Diagnosis not present

## 2016-11-07 NOTE — Patient Instructions (Addendum)

## 2016-11-09 ENCOUNTER — Telehealth: Payer: Self-pay | Admitting: Cardiovascular Disease

## 2016-11-09 NOTE — Telephone Encounter (Signed)
Pt has a question regarding a bleed she has in her toe. States she is on Xarelto. States she has been off the Xarelto for 2 days, she needs to know what to do next.

## 2016-11-09 NOTE — Telephone Encounter (Signed)
Spoke w/ pt.  Advised her to have her PCP take a look at her toe, as no one else has evaluated it.  She is agreeable & will call Dr. Milinda Pointer to see if he can see her, as the discoloration is not improving.

## 2016-11-10 ENCOUNTER — Ambulatory Visit (INDEPENDENT_AMBULATORY_CARE_PROVIDER_SITE_OTHER): Payer: Medicare HMO

## 2016-11-10 ENCOUNTER — Ambulatory Visit (INDEPENDENT_AMBULATORY_CARE_PROVIDER_SITE_OTHER): Payer: Medicare HMO | Admitting: Podiatry

## 2016-11-10 ENCOUNTER — Encounter: Payer: Self-pay | Admitting: Podiatry

## 2016-11-10 DIAGNOSIS — M79675 Pain in left toe(s): Secondary | ICD-10-CM

## 2016-11-10 DIAGNOSIS — S92525A Nondisplaced fracture of medial phalanx of left lesser toe(s), initial encounter for closed fracture: Secondary | ICD-10-CM | POA: Diagnosis not present

## 2016-11-18 NOTE — Progress Notes (Signed)
   HPI: Patient presents today with a new complaint for an injury to the second and third digit of the left foot. Patient states that her second digit of very red and swollen. Patient states that she injured her toe recently when stubbing it against something. She is on Xarelto and she stopped taking it earlier this week  as per her cardiologist  Physical Exam: General: The patient is alert and oriented x3 in no acute distress.  Dermatology: Skin is warm, dry and supple bilateral lower extremities. Negative for open lesions or macerations.  Vascular: Palpable pedal pulses bilaterally. There is some moderate ecchymosis with edema noted to the second digit of the left foot.   Neurological: Epicritic and protective threshold grossly intact bilaterally.   Musculoskeletal Exam: Range of motion within normal limits to all pedal and ankle joints bilateral. Muscle strength 5/5 in all groups bilateral.   Radiographic Exam:   intra-articular, nondisplaced fracture of the middle phalanx second digit left foot noted  Assessment: 1.  Closed nondisplaced toe fracture second digit left foot with edema and ecchymosis   Plan of Care:  1. Patient was evaluated. x-rays reviewed today  2.  The patient has minimal pain during ambulation. Today we recommend the patient continue to wear good supportive shoe gear. The patient that this will take time to heal and she may experience some edema for the next few months while it is healing. 3. Return to clinic on next scheduled appointment with Dr. Prudence Davidson for routine nail care    Edrick Kins, DPM [facility]  Dr. Edrick Kins, DPM    [address], [place] [address]                Office [phone]  Fax [phone]

## 2016-11-20 ENCOUNTER — Ambulatory Visit (INDEPENDENT_AMBULATORY_CARE_PROVIDER_SITE_OTHER): Payer: Medicare HMO | Admitting: Podiatry

## 2016-11-20 DIAGNOSIS — M79676 Pain in unspecified toe(s): Secondary | ICD-10-CM | POA: Diagnosis not present

## 2016-11-20 DIAGNOSIS — E1140 Type 2 diabetes mellitus with diabetic neuropathy, unspecified: Secondary | ICD-10-CM

## 2016-11-20 DIAGNOSIS — B351 Tinea unguium: Secondary | ICD-10-CM

## 2016-11-20 DIAGNOSIS — E1149 Type 2 diabetes mellitus with other diabetic neurological complication: Secondary | ICD-10-CM | POA: Diagnosis not present

## 2016-11-20 NOTE — Addendum Note (Signed)
Addended by: Graceann Congress D on: 11/20/2016 02:51 PM   Modules accepted: Orders

## 2016-11-20 NOTE — Progress Notes (Signed)
Complaint:  Visit Type: Patient returns to my office for continued preventative foot care services. Complaint: Patient states" my nails have grown long and thick and become painful to walk and wear shoes" Patient has been diagnosed with DM with neuropathy.. The patient presents for preventative foot care services. No changes to ROS.  Patient gives history of red swollen foot which was related to her taking xarelto.  She was also diagnosed with second toe fracture left foot.  Podiatric Exam: Vascular: dorsalis pedis and posterior tibial pulses are palpable bilateral. Capillary return is immediate. Temperature gradient is WNL. Skin turgor WNL  Sensorium: Diminished  Semmes Weinstein monofilament test. Normal tactile sensation bilaterally. Nail Exam: Pt has thick disfigured discolored nails with subungual debris noted bilateral entire nail hallux through fifth toenails Ulcer Exam: There is no evidence of ulcer or pre-ulcerative changes or infection. Orthopedic Exam: Muscle tone and strength are WNL. No limitations in general ROM. No crepitus or effusions noted. Foot type and digits show no abnormalities. Bony prominences are unremarkable. Swelling foot/ankle.  Ecchymosis third toe left.  Red and swollen second toe left foot. Skin: No Porokeratosis. No infection or ulcers  Diagnosis:  Onychomycosis, , Pain in right toe, pain in left toes  Treatment & Plan Procedures and Treatment: Consent by patient was obtained for treatment procedures. The patient understood the discussion of treatment and procedures well. All questions were answered thoroughly reviewed. Debridement of mycotic and hypertrophic toenails, 1 through 5 bilateral and clearing of subungual debris. No ulceration, no infection noted.  Buddy taped her 2,3 toes left foot. Return Visit-Office Procedure: Patient instructed to return to the office for a follow up visit 3 months for continued evaluation and treatment.    Gardiner Barefoot DPM

## 2016-12-04 ENCOUNTER — Telehealth: Payer: Self-pay | Admitting: Cardiovascular Disease

## 2016-12-04 NOTE — Telephone Encounter (Signed)
Patient calling the office for samples of medication:   1.  What medication and dosage are you requesting samples for? Xarelto  2.  Are you currently out of this medication?  Has a few days left

## 2016-12-04 NOTE — Telephone Encounter (Signed)
Xarelto 20 mg samples placed at front desk for pick up. 

## 2016-12-13 DIAGNOSIS — R809 Proteinuria, unspecified: Secondary | ICD-10-CM | POA: Diagnosis not present

## 2016-12-13 DIAGNOSIS — E039 Hypothyroidism, unspecified: Secondary | ICD-10-CM | POA: Diagnosis not present

## 2016-12-13 DIAGNOSIS — Z794 Long term (current) use of insulin: Secondary | ICD-10-CM | POA: Diagnosis not present

## 2016-12-13 DIAGNOSIS — E1129 Type 2 diabetes mellitus with other diabetic kidney complication: Secondary | ICD-10-CM | POA: Diagnosis not present

## 2016-12-21 DIAGNOSIS — E538 Deficiency of other specified B group vitamins: Secondary | ICD-10-CM | POA: Diagnosis not present

## 2016-12-21 DIAGNOSIS — E1142 Type 2 diabetes mellitus with diabetic polyneuropathy: Secondary | ICD-10-CM | POA: Diagnosis not present

## 2016-12-21 DIAGNOSIS — E11649 Type 2 diabetes mellitus with hypoglycemia without coma: Secondary | ICD-10-CM | POA: Diagnosis not present

## 2016-12-21 DIAGNOSIS — E1129 Type 2 diabetes mellitus with other diabetic kidney complication: Secondary | ICD-10-CM | POA: Diagnosis not present

## 2016-12-21 DIAGNOSIS — Z794 Long term (current) use of insulin: Secondary | ICD-10-CM | POA: Diagnosis not present

## 2016-12-21 DIAGNOSIS — E039 Hypothyroidism, unspecified: Secondary | ICD-10-CM | POA: Diagnosis not present

## 2016-12-21 DIAGNOSIS — R809 Proteinuria, unspecified: Secondary | ICD-10-CM | POA: Diagnosis not present

## 2016-12-25 ENCOUNTER — Other Ambulatory Visit: Payer: Self-pay | Admitting: Cardiovascular Disease

## 2017-01-19 DIAGNOSIS — S52532A Colles' fracture of left radius, initial encounter for closed fracture: Secondary | ICD-10-CM | POA: Diagnosis not present

## 2017-01-22 ENCOUNTER — Telehealth: Payer: Self-pay

## 2017-01-22 NOTE — Telephone Encounter (Signed)
Pt reports she would like to consider switching xarelto to another anticoagulant. She use to take coumadin; INR checked at [facility] which she felt was too far to drive. She found out that our office has coumadin clinic and may want to consider switching in the future. Reports she has called for Xarelto refill with [facility] and has enough on hand until Thursday. She is sure she will have someone pick up refill prior to Thursday so she will not miss any doses. Pt had no further questions at this time.

## 2017-01-22 NOTE — Telephone Encounter (Signed)
Pt would like Xarelto samples. Please call and advise of availability

## 2017-01-22 NOTE — Telephone Encounter (Signed)
Spoke with patient and let her know we are currently out of Xarelto 20MG  samples. Patient stated she would like to talk to Dr. Rockey Situ about maybe coming off of Xarelto and being put on something different. I told her I will forward this message to the nurse and see about any recommendations.  Please advise.

## 2017-01-24 DIAGNOSIS — S52532A Colles' fracture of left radius, initial encounter for closed fracture: Secondary | ICD-10-CM | POA: Diagnosis not present

## 2017-01-26 ENCOUNTER — Other Ambulatory Visit: Payer: Self-pay | Admitting: Cardiovascular Disease

## 2017-02-14 DIAGNOSIS — S52532D Colles' fracture of left radius, subsequent encounter for closed fracture with routine healing: Secondary | ICD-10-CM | POA: Diagnosis not present

## 2017-02-15 DIAGNOSIS — R69 Illness, unspecified: Secondary | ICD-10-CM | POA: Diagnosis not present

## 2017-02-19 ENCOUNTER — Ambulatory Visit: Payer: Medicare HMO | Admitting: Podiatry

## 2017-02-22 DIAGNOSIS — S52532D Colles' fracture of left radius, subsequent encounter for closed fracture with routine healing: Secondary | ICD-10-CM | POA: Diagnosis not present

## 2017-02-27 DIAGNOSIS — S52532D Colles' fracture of left radius, subsequent encounter for closed fracture with routine healing: Secondary | ICD-10-CM | POA: Diagnosis not present

## 2017-03-01 ENCOUNTER — Ambulatory Visit: Payer: Medicare HMO | Admitting: Podiatry

## 2017-03-01 ENCOUNTER — Encounter: Payer: Self-pay | Admitting: Podiatry

## 2017-03-01 DIAGNOSIS — E1140 Type 2 diabetes mellitus with diabetic neuropathy, unspecified: Secondary | ICD-10-CM

## 2017-03-01 DIAGNOSIS — M79676 Pain in unspecified toe(s): Secondary | ICD-10-CM

## 2017-03-01 DIAGNOSIS — E1149 Type 2 diabetes mellitus with other diabetic neurological complication: Secondary | ICD-10-CM

## 2017-03-01 DIAGNOSIS — B351 Tinea unguium: Secondary | ICD-10-CM

## 2017-03-01 DIAGNOSIS — S52532D Colles' fracture of left radius, subsequent encounter for closed fracture with routine healing: Secondary | ICD-10-CM | POA: Diagnosis not present

## 2017-03-01 NOTE — Progress Notes (Signed)
Complaint:  Visit Type: Patient returns to my office for continued preventative foot care services. Complaint: Patient states" my nails have grown long and thick and become painful to walk and wear shoes" Patient has been diagnosed with DM with neuropathy.. The patient presents for preventative foot care services. No changes to ROS  Podiatric Exam: Vascular: dorsalis pedis and posterior tibial pulses are palpable bilateral. Capillary return is immediate. Temperature gradient is WNL. Skin turgor WNL  Sensorium: Diminished  Semmes Weinstein monofilament test. Normal tactile sensation bilaterally. Nail Exam: Pt has thick disfigured discolored nails with subungual debris noted bilateral entire nail hallux through fifth toenails Ulcer Exam: There is no evidence of ulcer or pre-ulcerative changes or infection. Orthopedic Exam: Muscle tone and strength are WNL. No limitations in general ROM. No crepitus or effusions noted. Foot type and digits show no abnormalities. Bony prominences are unremarkable. Skin: No Porokeratosis. No infection or ulcers  Diagnosis:  Onychomycosis, , Pain in right toe, pain in left toes  Treatment & Plan Procedures and Treatment: Consent by patient was obtained for treatment procedures. The patient understood the discussion of treatment and procedures well. All questions were answered thoroughly reviewed. Debridement of mycotic and hypertrophic toenails, 1 through 5 bilateral and clearing of subungual debris. No ulceration, no infection noted.  Return Visit-Office Procedure: Patient instructed to return to the office for a follow up visit 3 months for continued evaluation and treatment.    Nubia Ziesmer DPM 

## 2017-03-07 DIAGNOSIS — G5602 Carpal tunnel syndrome, left upper limb: Secondary | ICD-10-CM | POA: Diagnosis not present

## 2017-03-08 ENCOUNTER — Other Ambulatory Visit: Payer: Self-pay | Admitting: Cardiovascular Disease

## 2017-03-14 DIAGNOSIS — G5602 Carpal tunnel syndrome, left upper limb: Secondary | ICD-10-CM | POA: Diagnosis not present

## 2017-03-18 ENCOUNTER — Emergency Department
Admission: EM | Admit: 2017-03-18 | Discharge: 2017-03-18 | Disposition: A | Discharge status: Home / Self Care | Payer: Medicare HMO | Attending: Emergency Medicine | Admitting: Emergency Medicine

## 2017-03-18 ENCOUNTER — Emergency Department: Payer: Medicare HMO

## 2017-03-18 ENCOUNTER — Encounter: Payer: Self-pay | Admitting: Emergency Medicine

## 2017-03-18 DIAGNOSIS — I110 Hypertensive heart disease with heart failure: Secondary | ICD-10-CM | POA: Insufficient documentation

## 2017-03-18 DIAGNOSIS — Z794 Long term (current) use of insulin: Secondary | ICD-10-CM | POA: Insufficient documentation

## 2017-03-18 DIAGNOSIS — Z79899 Other long term (current) drug therapy: Secondary | ICD-10-CM | POA: Insufficient documentation

## 2017-03-18 DIAGNOSIS — I5032 Chronic diastolic (congestive) heart failure: Secondary | ICD-10-CM | POA: Insufficient documentation

## 2017-03-18 DIAGNOSIS — E119 Type 2 diabetes mellitus without complications: Secondary | ICD-10-CM | POA: Diagnosis not present

## 2017-03-18 DIAGNOSIS — Z8543 Personal history of malignant neoplasm of ovary: Secondary | ICD-10-CM | POA: Diagnosis not present

## 2017-03-18 DIAGNOSIS — E039 Hypothyroidism, unspecified: Secondary | ICD-10-CM | POA: Insufficient documentation

## 2017-03-18 DIAGNOSIS — R079 Chest pain, unspecified: Secondary | ICD-10-CM | POA: Insufficient documentation

## 2017-03-18 LAB — CBC
HCT: 39.4 % (ref 35.0–47.0)
HEMOGLOBIN: 13.6 g/dL (ref 12.0–16.0)
MCH: 34.6 pg — AB (ref 26.0–34.0)
MCHC: 34.5 g/dL (ref 32.0–36.0)
MCV: 100.2 fL — ABNORMAL HIGH (ref 80.0–100.0)
PLATELETS: 158 10*3/uL (ref 150–440)
RBC: 3.93 MIL/uL (ref 3.80–5.20)
RDW: 13.8 % (ref 11.5–14.5)
WBC: 8 10*3/uL (ref 3.6–11.0)

## 2017-03-18 LAB — URINALYSIS, COMPLETE (UACMP) WITH MICROSCOPIC
Bacteria, UA: NONE SEEN
Bilirubin Urine: NEGATIVE
GLUCOSE, UA: NEGATIVE mg/dL
HGB URINE DIPSTICK: NEGATIVE
KETONES UR: NEGATIVE mg/dL
LEUKOCYTES UA: NEGATIVE
NITRITE: NEGATIVE
PH: 8 (ref 5.0–8.0)
Protein, ur: NEGATIVE mg/dL
SPECIFIC GRAVITY, URINE: 1.027 (ref 1.005–1.030)

## 2017-03-18 LAB — BASIC METABOLIC PANEL
ANION GAP: 12 (ref 5–15)
BUN: 15 mg/dL (ref 6–20)
CALCIUM: 9.2 mg/dL (ref 8.9–10.3)
CO2: 28 mmol/L (ref 22–32)
CREATININE: 0.63 mg/dL (ref 0.44–1.00)
Chloride: 101 mmol/L (ref 101–111)
GFR calc non Af Amer: >60 mL/min (ref >60)
Glucose, Bld: 127 mg/dL — ABNORMAL HIGH (ref 65–99)
Potassium: 3.6 mmol/L (ref 3.5–5.1)
SODIUM: 141 mmol/L (ref 135–145)

## 2017-03-18 LAB — TROPONIN I: Troponin I: <0.03 ng/mL (ref <0.03)

## 2017-03-18 MED ORDER — ASPIRIN 81 MG PO CHEW: 324.0000 mg | CHEWABLE_TABLET | Freq: Once | ORAL | Status: DC

## 2017-03-18 MED ORDER — ACETAMINOPHEN 500 MG PO TABS
1000.0000 mg | ORAL_TABLET | Freq: Once | ORAL | Status: AC
  Administered 2017-03-18: 1000 mg via ORAL
  Filled 2017-03-18: qty 2

## 2017-03-18 MED ORDER — IOPAMIDOL (ISOVUE-370) INJECTION 76%
75.0000 mL | Freq: Once | INTRAVENOUS | Status: AC | PRN
  Administered 2017-03-18: 75 mL via INTRAVENOUS

## 2017-03-18 NOTE — ED Notes (Signed)
Pt assisted up to commode for urination.

## 2017-03-18 NOTE — ED Notes (Signed)
Pt reports improved pain post tylenol administration.

## 2017-03-18 NOTE — ED Notes (Signed)
Pt assisted up to commode to urinate.

## 2017-03-18 NOTE — ED Notes (Signed)
Will wait for urine result before discharging pt

## 2017-03-18 NOTE — ED Notes (Signed)
Pt returned from xray

## 2017-03-18 NOTE — ED Triage Notes (Signed)
Patient states that about 23:00 last night she developed pain to her neck, back, chest and down her left arm. Patient denies shortness of breath or nausea. Patient states that she took 325 asa at home.

## 2017-03-18 NOTE — ED Notes (Signed)
This RN to bedside at this time to introduce self to patient. Pt is noted to be resting in bed, NAD noted. This RN explained delay to patient. Pt states understanding. VSS and WNL. Will continue to monitor for further patient needs.

## 2017-03-18 NOTE — ED Notes (Signed)
Report to megan, rn.  

## 2017-03-18 NOTE — ED Notes (Signed)
NAD noted at time of D/C. Pt and son denies questions or concerns. Pt taken to the lobby via wheelchair at this time.   

## 2017-03-18 NOTE — ED Provider Notes (Signed)
[facility] Emergency Department Provider Note  ____________________________________________  Time seen: Approximately 4:45 AM  I have reviewed the triage vital signs and the nursing notes.   HISTORY  Chief Complaint Chest Pain   HPI Jodi Reeves is a 83 y.o. female with a history of atrial fibrillation on Xarelto, remote history of ovarian cancer, hypertension, hyperlipidemia, diabetes who presents for evaluation of chest pain. Patient reports that the pain started earlier this evening while she was sitting in her house. She reports the pain started as a throbbing/burning sensation in the base of her neck and then it spread to involve her entire chest and upper back and went down bilateral arms. The pain was worse with deep inspiration and patient reports mild lightheadedness. No diaphoresis, no shortness of breath. The pain initially was severe and is now 5 out of 10. Patient denies ever having similar pain in the past. No personal or family history of heart attacks or blood clots, no recent travel immobilization, no leg pain or swelling, no hemoptysis, no exogenous hormones.patient took a full dose of aspirin at home. No URI symptoms, cough or fever. No numbness or weakness of her extremities.  Past Medical History:  Diagnosis Date  . Anxiety   . Atrial fibrillation ([place])   . Atrial fibrillation ([place])   . Balance disorder   . DM2 (diabetes mellitus, type 2) ([place])   . Edema, lower extremity   . GERD (gastroesophageal reflux disease)    esophageal web  . Granuloma annulare    [name].   Marland Kitchen HLD (hyperlipidemia)   . HTN (hypertension)   . Hypothyroidism   . Incontinence of bowel   . Osteoporosis   . Ovarian cancer ([place])   . TIA (transient ischemic attack)     Patient Active Problem List   Diagnosis Date Noted  . Problems with swallowing and mastication   . Esophageal candidiasis ([place])   . Stricture and stenosis of esophagus 05/02/2016  .  TIA (transient ischemic attack) 06/24/2015  . 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 Humeral head fracture 10/06/2014  . Chest pain 03/27/2014  . Shortness of breath 03/27/2014  . Chronic diastolic CHF (congestive heart failure) ([place]) 03/27/2014  . Advanced directives, counseling/discussion 02/24/2014  . Routine general medical examination at a health care facility 02/17/2013  . Diabetes, polyneuropathy ([employer]) 02/17/2013  . Type II diabetes mellitus with neurological manifestations ([place]) 02/17/2013  . Gait instability 04/11/2012  . Atrial fibrillation ([place]) 04/11/2012  . Edema 11/14/2010  . OTHER CONSTIPATION 05/31/2007  . VENTRAL HERNIA 03/15/2007  . Ovarian cancer ([place]) 11/20/2006  . Hypothyroidism 11/20/2006  . Type 2 diabetes mellitus with neurological manifestations, controlled ([place]) 11/20/2006  . Hyperlipemia 11/20/2006  . ANXIETY 11/20/2006  . Essential hypertension 11/20/2006  . OSTEOPOROSIS 11/20/2006    Past Surgical History:  Procedure Laterality Date  . 2nd look laparotomy  1982  . ABDOMINAL HYSTERECTOMY    . CARDIAC CATHETERIZATION  8/05   neg; a fib found   . cataract OD  2002  . cataract surgery  5/07   R  . CHOLECYSTECTOMY  1986  . DEXA  9/04 and 1/02  . EGD/dilation/colon  1/06  . ESOPHAGOGASTRODUODENOSCOPY (EGD) WITH PROPOFOL N/A 05/16/2016   Procedure: ESOPHAGOGASTRODUODENOSCOPY (EGD) WITH PROPOFOL with dilation;  Surgeon: Lucilla Lame, MD;  Location: [facility] ENDOSCOPY;  Service: Endoscopy;  Laterality: N/A;  . FEMORAL HERNIA REPAIR  1958   R  . fracture L elbow/wrist  2000  .  intussception/obstruction  12/98  . kidney stone x2  1991  . laminectomy L4-5  1971  . LUMBAR LAMINECTOMY    . MOUTH SURGERY    . myoview stress  8/05   syncope (-)  . REVERSE SHOULDER ARTHROPLASTY Left 10/06/2014   Procedure: REVERSE SHOULDER ARTHROPLASTY;  Surgeon: Corky Mull, MD;  Location: [facility] ORS;  Service: Orthopedics;  Laterality: Left;  .  stillbirth  1969  . TOTAL ABDOMINAL HYSTERECTOMY W/ BILATERAL SALPINGOOPHORECTOMY  1980   ovarian cancer  . WRIST FRACTURE SURGERY  11/05   R    Prior to Admission medications   Medication Sig Start Date End Date Taking? Authorizing Provider  Ascorbic Acid (VITAMIN C) 1000 MG tablet Take 1,000 mg by mouth daily.      [provider]  BD PEN NEEDLE NANO U/F 32G X 4 MM MISC Inject 1 Syringe as directed as needed.  04/28/11   [provider]  bisoprolol-hydrochlorothiazide (ZIAC) 2.5-6.25 MG tablet TAKE ONE TABLET BY MOUTH EVERY DAY 03/08/17   Minna Merritts, MD  Calcium-Magnesium-Vitamin D (CALCIUM 500 PO) Take 500 mg by mouth 2 (two) times daily.    [provider]  Cholecalciferol (VITAMIN D3) 2000 UNITS TABS Take by mouth daily.    [provider]  co-enzyme Q-10 30 MG capsule Take 30 mg by mouth 3 (three) times daily.    [provider]  digoxin (LANOXIN) 0.125 MG tablet TAKE ONE (1) TABLET BY MOUTH EVERY DAY 09/12/16   Minna Merritts, MD  famotidine (PEPCID) 20 MG tablet Take 20 mg by mouth as needed. 04/20/16   [provider]  fluorometholone (FML) 0.1 % ophthalmic suspension Place 1 drop into both eyes as needed.  01/23/13   [provider]  Folic Acid-Vit J6-RCV E93 (FA-VITAMIN B-6-VITAMIN B-12) 2.2-25-0.5 MG TABS Take by mouth daily.     [provider]  furosemide (LASIX) 20 MG tablet Take 1 tablet (20 mg total) by mouth 2 (two) times daily as needed. 06/11/14   Minna Merritts, MD  furosemide (LASIX) 20 MG tablet TAKE ONE TABLET BY MOUTH TWICE DAILY AS NEEDED 01/26/17   Minna Merritts, MD  glipiZIDE (GLUCOTROL XL) 2.5 MG 24 hr tablet Take 2.5 mg by mouth daily with breakfast.    [provider]  glucose blood (ONE TOUCH ULTRA TEST) test strip Use to test blood sugar once daily E11.49 06/24/15   Bedsole, Amy E, MD  insulin NPH Human (HUMULIN N,NOVOLIN N) 100 UNIT/ML injection Inject 16 Units into the  skin at bedtime.     [provider]  levothyroxine (SYNTHROID, LEVOTHROID) 50 MCG tablet Take 50 mcg by mouth daily before breakfast.    [provider]  metFORMIN (GLUCOPHAGE) 500 MG tablet Take 500 mg by mouth 2 (two) times daily.    [provider]  Multiple Vitamin (MULTIVITAMIN) capsule Take 1 capsule by mouth daily.    [provider]  Omega-3 1000 MG CAPS Take 1,000 mg by mouth 2 (two) times daily.    [provider]  potassium chloride (K-DUR) 10 MEQ tablet Take 1 tablet (10 mEq total) by mouth daily as needed. 07/04/16   Gollan, Kathlene November, MD  XARELTO 20 MG TABS tablet TAKE ONE (1) TABLET BY MOUTH EVERY DAY 12/26/16   Minna Merritts, MD    Allergies Alendronate sodium; Atorvastatin; Ciprofloxacin; Codeine; Ezetimibe-simvastatin; Fluticasone; Lovastatin; Penicillins; Rosiglitazone maleate; and Sulfamethoxazole-trimethoprim  Family History  Diabetes Brother    Myocardial Infarction (  Heart attack) Father    Diabetes Mother    Stroke Mother    Hypothyroidism Neg Hx      Social History Social History   Tobacco Use  . Smoking status: Never Smoker  . Smokeless tobacco: Never Used  Substance Use Topics  . Alcohol use: No  . Drug use: No    Review of Systems  Constitutional: Negative for fever. Eyes: Negative for visual changes. ENT: Negative for sore throat. Neck: No neck pain  Cardiovascular: + chest pain. Respiratory: Negative for shortness of breath. Gastrointestinal: Negative for abdominal pain, vomiting or diarrhea. Genitourinary: Negative for dysuria. Musculoskeletal: Negative for back pain. Skin: Negative for rash. Neurological: Negative for headaches, weakness or numbness. Psych: No SI or HI  ____________________________________________   PHYSICAL EXAM:  VITAL SIGNS: ED Triage Vitals  Enc Vitals Group     BP 03/18/17 0345 (!) 163/81     Pulse Rate 03/18/17 0345 76     Resp 03/18/17 0345 18      Temp 03/18/17 0343 98.2 F (36.8 C)     Temp Source 03/18/17 0343 Oral     SpO2 03/18/17 0345 97 %     Weight 03/18/17 0343 160 lb (72.6 kg)     Height 03/18/17 0343 5\' 3"  (1.6 m)     Head Circumference --      Peak Flow --      Pain Score 03/18/17 0343 5     Pain Loc --      Pain Edu? --      Excl. in [place]? --     Constitutional: Alert and oriented. Well appearing and in no apparent distress. HEENT:      Head: Normocephalic and atraumatic.         Eyes: Conjunctivae are normal. Sclera is non-icteric.       Mouth/Throat: Mucous membranes are moist.       Neck: Supple with no signs of meningismus. Cardiovascular: Regular rate and rhythm. No murmurs, gallops, or rubs. 2+ symmetrical distal pulses are present in all extremities. No JVD. Respiratory: Normal respiratory effort. Lungs are clear to auscultation bilaterally. No wheezes, crackles, or rhonchi.  Gastrointestinal: Soft, non tender, and non distended with positive bowel sounds. No rebound or guarding. Musculoskeletal: Nontender with normal range of motion in all extremities. No edema, cyanosis, or erythema of extremities. Neurologic: Normal speech and language. Face is symmetric. Moving all extremities. No gross focal neurologic deficits are appreciated. Skin: Skin is warm, dry and intact. No rash noted. Psychiatric: Mood and affect are normal. Speech and behavior are normal.  ____________________________________________   LABS (all labs ordered are listed, but only abnormal results are displayed)  Labs Reviewed  BASIC METABOLIC PANEL - Abnormal; Notable for the following components:      Result Value   Glucose, Bld 127 (*)    All other components within normal limits  CBC - Abnormal; Notable for the following components:   MCV 100.2 (*)    MCH 34.6 (*)    All other components within normal limits  TROPONIN I  TROPONIN I   ____________________________________________  EKG  ED ECG REPORT I, Rudene Re, the  attending physician, personally viewed and interpreted this ECG.  Atrial fibrillation, rate of 69, normal QTC, left axis deviation, no ST elevations or depressions. unchanged from prior from July 2018 ____________________________________________  RADIOLOGY  CXR:  Emphysematous changes and chronic bronchitic changes in the lungs. No evidence of active pulmonary disease. Cardiac enlargement. Aortic atherosclerosis. ____________________________________________  PROCEDURES  Procedure(s) performed: None Procedures Critical Care performed:  None ____________________________________________   INITIAL IMPRESSION / ASSESSMENT AND PLAN / ED COURSE   83 y.o. female with a history of atrial fibrillation on Xarelto, remote history of ovarian cancer, hypertension, hyperlipidemia, diabetes who presents for evaluation of chest pain for several hours that she describes as a burning throbbing sensation diffuse across her chest, upper back, bilateral arms. The pain is worse with inspiration the patient denies shortness of breath. EKG showing A. fib with no evidence of ischemia. First troponin is negative. Differential diagnoses including reflux versus ACS versus PE especially since patient has a history of remote cancer. Plan for CTA, 2 cardiac enzymes, monitoring on telemetry. We'll give Tylenol for pain. Patient took aspirin at home.  ----------------------------------------- 4:49 AM on 03/18/2017 ----------------------------------------- OBSERVATION CARE: This patient is being placed under observation care for the following reasons: Chest pain with repeat testing to rule out ischemia   Clinical Course as of Mar 18 730  Nancy Fetter Mar 18, 2017  0609 CTA negative for PE. 2nd trop at [place], [place], MD    ----------------------------------------- 7:30 AM on 03/18/2017 ----------------------------------------- END OF OBSERVATION STATUS: After an  appropriate period of observation, this patient is being discharged due to the following reason(s):  2nd troponin negative. Patient remains well appearing with no CP. Will dc with close f/u with dr. Rockey Situ. Discussed return precautions with patient and son.  As part of my medical decision making, I reviewed the following data within the [place] notes reviewed and incorporated, Labs reviewed , EKG interpreted , Old EKG reviewed, Old chart reviewed,  Radiograph reviewed , Notes from prior ED visits and [place] Controlled Substance Database    Pertinent labs & imaging results that were available during my care of the patient were reviewed by me and considered in my medical decision making (see chart for details).    ____________________________________________   FINAL CLINICAL IMPRESSION(S) / ED DIAGNOSES  Final diagnoses:  Chest pain, unspecified type      NEW MEDICATIONS STARTED DURING THIS VISIT:  ED Discharge Orders    None       Note:  This document was prepared using Dragon voice recognition software and may include unintentional dictation errors.    Rudene Re, MD 03/18/17 [phone]

## 2017-03-20 DIAGNOSIS — S52532D Colles' fracture of left radius, subsequent encounter for closed fracture with routine healing: Secondary | ICD-10-CM | POA: Diagnosis not present

## 2017-03-20 DIAGNOSIS — G5602 Carpal tunnel syndrome, left upper limb: Secondary | ICD-10-CM | POA: Diagnosis not present

## 2017-03-20 DIAGNOSIS — M542 Cervicalgia: Secondary | ICD-10-CM | POA: Diagnosis not present

## 2017-03-28 ENCOUNTER — Encounter: Payer: Self-pay | Admitting: Physician Assistant

## 2017-03-28 DIAGNOSIS — M5412 Radiculopathy, cervical region: Secondary | ICD-10-CM | POA: Diagnosis not present

## 2017-03-28 DIAGNOSIS — G5600 Carpal tunnel syndrome, unspecified upper limb: Secondary | ICD-10-CM | POA: Insufficient documentation

## 2017-03-28 DIAGNOSIS — G5602 Carpal tunnel syndrome, left upper limb: Secondary | ICD-10-CM | POA: Diagnosis not present
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
## 2017-03-29 ENCOUNTER — Encounter: Payer: Self-pay | Admitting: Physician Assistant

## 2017-03-29 ENCOUNTER — Telehealth: Payer: Self-pay | Admitting: Cardiovascular Disease

## 2017-03-29 ENCOUNTER — Ambulatory Visit: Payer: Medicare HMO | Admitting: Physician Assistant

## 2017-03-29 VITALS — BP 148/70 | HR 72 | Ht 63.0 in | Wt 161.2 lb

## 2017-03-29 DIAGNOSIS — I5032 Chronic diastolic (congestive) heart failure: Secondary | ICD-10-CM

## 2017-03-29 DIAGNOSIS — R809 Proteinuria, unspecified: Secondary | ICD-10-CM | POA: Diagnosis not present

## 2017-03-29 DIAGNOSIS — I4819 Other persistent atrial fibrillation: Secondary | ICD-10-CM

## 2017-03-29 DIAGNOSIS — I10 Essential (primary) hypertension: Secondary | ICD-10-CM

## 2017-03-29 DIAGNOSIS — I481 Persistent atrial fibrillation: Secondary | ICD-10-CM

## 2017-03-29 DIAGNOSIS — I2510 Atherosclerotic heart disease of native coronary artery without angina pectoris: Secondary | ICD-10-CM | POA: Diagnosis not present

## 2017-03-29 DIAGNOSIS — E538 Deficiency of other specified B group vitamins: Secondary | ICD-10-CM | POA: Diagnosis not present

## 2017-03-29 DIAGNOSIS — E039 Hypothyroidism, unspecified: Secondary | ICD-10-CM | POA: Diagnosis not present

## 2017-03-29 DIAGNOSIS — Z01818 Encounter for other preprocedural examination: Secondary | ICD-10-CM | POA: Diagnosis not present

## 2017-03-29 DIAGNOSIS — Z794 Long term (current) use of insulin: Secondary | ICD-10-CM | POA: Diagnosis not present

## 2017-03-29 DIAGNOSIS — E782 Mixed hyperlipidemia: Secondary | ICD-10-CM

## 2017-03-29 DIAGNOSIS — E1142 Type 2 diabetes mellitus with diabetic polyneuropathy: Secondary | ICD-10-CM | POA: Diagnosis not present

## 2017-03-29 DIAGNOSIS — R079 Chest pain, unspecified: Secondary | ICD-10-CM

## 2017-03-29 DIAGNOSIS — Z23 Encounter for immunization: Secondary | ICD-10-CM

## 2017-03-29 DIAGNOSIS — E11649 Type 2 diabetes mellitus with hypoglycemia without coma: Secondary | ICD-10-CM | POA: Diagnosis not present

## 2017-03-29 DIAGNOSIS — E1129 Type 2 diabetes mellitus with other diabetic kidney complication: Secondary | ICD-10-CM | POA: Diagnosis not present

## 2017-03-29 NOTE — Telephone Encounter (Signed)
Please see office note from 12/6.

## 2017-03-29 NOTE — Patient Instructions (Addendum)
Medication Instructions:  Your physician recommends that you continue on your current medications as directed. Please refer to the Current Medication list given to you today.   Labwork: none  Testing/Procedures: Your physician has requested that you have a lexiscan myoview. For further information please visit [email or web address]. Please follow instruction sheet, as given.  [place]  Your caregiver has ordered a Stress Test with nuclear imaging. The purpose of this test is to evaluate the blood supply to your heart muscle. This procedure is referred to as a "Non-Invasive Stress Test." This is because other than having an IV started in your vein, nothing is inserted or "invades" your body. Cardiac stress tests are done to find areas of poor blood flow to the heart by determining the extent of coronary artery disease (CAD). Some patients exercise on a treadmill, which naturally increases the blood flow to your heart, while others who are  unable to walk on a treadmill due to physical limitations have a pharmacologic/chemical stress agent called Lexiscan . This medicine will mimic walking on a treadmill by temporarily increasing your coronary blood flow.   Please note: these test may take anywhere between 2-4 hours to complete  PLEASE REPORT TO [facility] AT THE FIRST DESK WILL DIRECT YOU WHERE TO GO  Date of Procedure:___Wednesday, December 12___  Arrival Time for Procedure:__9:15am__  Instructions regarding medication:   _xx___ : Hold diabetes medication morning of procedure _xx_     : Take 1/2 dose of night time insulin (5 units) the night before  _xx___:  Hold bisoprolol-hctz morning of procedure  _xx___:  Hold lasix the morning of your test   PLEASE NOTIFY THE OFFICE AT LEAST 24 HOURS IN ADVANCE IF YOU ARE UNABLE TO KEEP YOUR APPOINTMENT.  [phone] AND  PLEASE NOTIFY NUCLEAR MEDICINE AT [facility] AT LEAST 24 HOURS IN ADVANCE IF YOU ARE UNABLE TO  KEEP YOUR APPOINTMENT. [phone]  How to prepare for your Myoview test:  1. Do not eat or drink after midnight 2. No caffeine for 24 hours prior to test 3. No smoking 24 hours prior to test. 4. Your medication may be taken with water.  If your doctor stopped a medication because of this test, do not take that medication. 5. Ladies, please do not wear dresses.  Skirts or pants are appropriate. Please wear a short sleeve shirt. 6. No perfume, cologne or lotion. 7. Wear comfortable walking shoes. No heels!            Follow-Up: Your physician recommends that you schedule a follow-up appointment with Dr. Rockey Situ after testing is complete.    Any Other Special Instructions Will Be Listed Below (If Applicable).     If you need a refill on your cardiac medications before your next appointment, please call your pharmacy.  Cardiac Nuclear Scan A cardiac nuclear scan is a test that measures blood flow to the heart when a person is resting and when he or she is exercising. The test looks for problems such as:  Not enough blood reaching a portion of the heart.  The heart muscle not working normally.  You may need this test if:  You have heart disease.  You have had abnormal lab results.  You have had heart surgery or angioplasty.  You have chest pain.  You have shortness of breath.  In this test, a radioactive dye (tracer) is injected into your bloodstream. After the tracer has traveled to your heart, an imaging device is  used to measure how much of the tracer is absorbed by or distributed to various areas of your heart. This procedure is usually done at a hospital and takes 2-4 hours. Tell a health care provider about:  Any allergies you have.  All medicines you are taking, including vitamins, herbs, eye drops, creams, and over-the-counter medicines.  Any problems you or family members have had with the use of anesthetic medicines.  Any blood disorders you  have.  Any surgeries you have had.  Any medical conditions you have.  Whether you are pregnant or may be pregnant. What are the risks? Generally, this is a safe procedure. However, problems may occur, including:  Serious chest pain and heart attack. This is only a risk if the stress portion of the test is done.  Rapid heartbeat.  Sensation of warmth in your chest. This usually passes quickly.  What happens before the procedure?  Ask your health care provider about changing or stopping your regular medicines. This is especially important if you are taking diabetes medicines or blood thinners.  Remove your jewelry on the day of the procedure. What happens during the procedure?  An IV tube will be inserted into one of your veins.  Your health care provider will inject a small amount of radioactive tracer through the tube.  You will wait for 20-40 minutes while the tracer travels through your bloodstream.  Your heart activity will be monitored with an electrocardiogram (ECG).  You will lie down on an exam table.  Images of your heart will be taken for about 15-20 minutes.  You may be asked to exercise on a treadmill or stationary bike. While you exercise, your heart's activity will be monitored with an ECG, and your blood pressure will be checked. If you are unable to exercise, you may be given a medicine to increase blood flow to parts of your heart.  When blood flow to your heart has peaked, a tracer will again be injected through the IV tube.  After 20-40 minutes, you will get back on the exam table and have more images taken of your heart.  When the procedure is over, your IV tube will be removed. The procedure may vary among health care providers and hospitals. Depending on the type of tracer used, scans may need to be repeated 3-4 hours later. What happens after the procedure?  Unless your health care provider tells you otherwise, you may return to your normal schedule,  including diet, activities, and medicines.  Unless your health care provider tells you otherwise, you may increase your fluid intake. This will help flush the contrast dye from your body. Drink enough fluid to keep your urine clear or pale yellow.  It is up to you to get your test results. Ask your health care provider, or the department that is doing the test, when your results will be ready. Summary  A cardiac nuclear scan measures the blood flow to the heart when a person is resting and when he or she is exercising.  You may need this test if you are at risk for heart disease.  Tell your health care provider if you are pregnant.  Unless your health care provider tells you otherwise, increase your fluid intake. This will help flush the contrast dye from your body. Drink enough fluid to keep your urine clear or pale yellow. This information is not intended to replace advice given to you by your health care provider. Make sure you discuss any questions you  have with your health care provider. Document Released: 05/05/2004 Document Revised: 04/12/2016 Document Reviewed: 03/19/2013 Elsevier Interactive Patient Education  2017 [employer].

## 2017-03-29 NOTE — Telephone Encounter (Signed)
Patient seeing Jodi Reeves today.

## 2017-03-29 NOTE — Telephone Encounter (Signed)
No answer. Left message to call back. Need to make sure patient does not have any new symptoms.

## 2017-03-29 NOTE — Telephone Encounter (Signed)
° °  [facility] Pre-operative Risk Assessment    Request for surgical clearance:  1. What type of surgery is being performed? Carpel Tunnel Release   2. When is this surgery scheduled? TBA  3. Are there any medications that need to be held prior to surgery and how long? Not listed   4. Practice name and name of physician performing surgery? [facility] Dr Earnestine Leys  5. What is your office phone and fax number? Phone [phone] Fax: [phone]  9. Anesthesia type (None, local, MAC, general) ? General   Placed in nurses bin _________________________________________________________________   (provider comments below)

## 2017-04-04 ENCOUNTER — Other Ambulatory Visit: Payer: Medicare HMO

## 2017-04-06 ENCOUNTER — Ambulatory Visit
Admission: RE | Admit: 2017-04-06 | Discharge: 2017-04-06 | Disposition: A | Discharge status: Home / Self Care | Payer: Medicare HMO | Source: Ambulatory Visit | Attending: Physician Assistant | Admitting: Physician Assistant

## 2017-04-06 ENCOUNTER — Telehealth: Payer: Self-pay | Admitting: Cardiology

## 2017-04-06 DIAGNOSIS — Z01818 Encounter for other preprocedural examination: Secondary | ICD-10-CM

## 2017-04-06 DIAGNOSIS — R079 Chest pain, unspecified: Secondary | ICD-10-CM | POA: Diagnosis not present

## 2017-04-06 LAB — NM MYOCAR MULTI W/SPECT W/WALL MOTION / EF
CSEPED: 0 min
CSEPEDS: 0 s
CSEPHR: 78 %
Estimated workload: 1 METS
LV dias vol: 22 mL (ref 46–106)
LV sys vol: 1 mL
MPHR: 133 {beats}/min
Peak HR: 105 {beats}/min
Rest HR: 79 {beats}/min
SDS: 0
SRS: 0
SSS: 0
TID: 1.13

## 2017-04-06 MED ORDER — TECHNETIUM TC 99M TETROFOSMIN IV KIT
13.3440 | PACK | Freq: Once | INTRAVENOUS | Status: AC | PRN
  Administered 2017-04-06: 13.344 via INTRAVENOUS

## 2017-04-06 MED ORDER — TECHNETIUM TC 99M TETROFOSMIN IV KIT
31.9500 | PACK | Freq: Once | INTRAVENOUS | Status: AC | PRN
  Administered 2017-04-06: 31.95 via INTRAVENOUS

## 2017-04-06 MED ORDER — REGADENOSON 0.4 MG/5ML IV SOLN
0.4000 mg | Freq: Once | INTRAVENOUS | Status: AC
  Administered 2017-04-06: 0.4 mg via INTRAVENOUS
  Filled 2017-04-06: qty 5

## 2017-04-06 NOTE — Telephone Encounter (Signed)
Pt takes Xarelto for afib with CHADS2VASc score of 31 (age -x2, sex, HTN, CHF, CAD, TIA, DM). CrCl is 2mL/min. Recommend continuing Xarelto if possible due to extremely elevated cardiac risk. If holding anticoagulation is necessary, recommend only holding for 24 hours prior due to elevated risk.

## 2017-04-06 NOTE — Telephone Encounter (Signed)
   Primary Cardiologist: Dr. Rockey Situ  Chart reviewed as part of pre-operative protocol coverage. Given past medical history and time since last visit, based on ACC/AHA guidelines, Jodi Reeves was seen in our office on 03/29/2017. A stress test was arranged that was done on 04/06/17 and was low risk with normal EF. She can proceed at moderate risk for carpal tunnel surgery.   Pt takes Xarelto for afib with CHADS2VASc score of 10 (age -x2, sex, HTN, CHF, CAD, TIA, DM). CrCl is 64mL/min. Recommend continuing Xarelto if possible due to extremely elevated cardiac risk. If holding anticoagulation is necessary, recommend only holding for 24 hours prior due to elevated risk. Resume as soon as safe.   I will route this recommendation to the requesting party via Epic fax function and remove from pre-op pool.  Please call with questions.  Daune Perch, NP 04/06/2017, 4:26 PM

## 2017-04-06 NOTE — Telephone Encounter (Signed)
Rise Mu, PA-C  Georgiana Shore, RN Cc: P Cv Div Preop Pharm        Stress test normal. She can proceed at moderate risk for carpal tunnel surgery. I have forwarded her chart to the pharmacy pre-op pool for input on holding her Xarelto given her comorbid conditions (prior TIA with CHADS2VASc of 8 - CHF, HTN, age x 2, DM, TIA x 2, Female).

## 2017-04-06 NOTE — Telephone Encounter (Signed)
I have called the patient to inform her of her normal stress test results and clearance for surgery with moderate risk. I reviewed the recommendations for her anticoagulation per the pharmacy. I let her know that I have sent her clearance correspondence to her surgeon. She verbalizes understanding and was grateful for the information and keeping her apprised of the process.   Daune Perch, AGNP-C [facility] 04/06/2017  4:44 PM

## 2017-04-13 ENCOUNTER — Other Ambulatory Visit: Payer: Self-pay | Admitting: Specialist

## 2017-04-23 ENCOUNTER — Telehealth: Payer: Self-pay | Admitting: Cardiovascular Disease

## 2017-04-23 DIAGNOSIS — M7541 Impingement syndrome of right shoulder: Secondary | ICD-10-CM | POA: Diagnosis not present

## 2017-04-23 NOTE — Telephone Encounter (Signed)
Per 04/06/17 phone documentation from Daune Perch, NP clearance & blood thinner were addressed- clearance was routed through Epic to [facility]. I did print this off and re-fax to [facility] [phone]. Confirmation received.

## 2017-04-23 NOTE — Telephone Encounter (Signed)
   [facility] Pre-operative Risk Assessment    Request for surgical clearance:  1. What type of surgery is being performed? Carpel tunnel left   2. When is this surgery scheduled? 04/30/2017   3. Are there any medications that need to be held prior to surgery and how long?aspirin, blood thinner, anti-flammatory, 5-7 days before  Practice name and name of physician performing surgery? [facility], Dr. Earnestine Leys  4. What is your office phone and fax number? [phone], Fax [phone]  5. Anesthesia type (None, local, MAC, general) ? General  Fax to be forthcoming   Jodi Reeves 04/23/2017, 2:22 PM  _________________________________________________________________   (provider comments below)

## 2017-04-26 ENCOUNTER — Inpatient Hospital Stay
Admission: RE | Admit: 2017-04-26 | Discharge: 2017-04-26 | Disposition: A | Discharge status: Home / Self Care | Payer: Medicare HMO | Source: Ambulatory Visit

## 2017-04-26 DIAGNOSIS — I10 Essential (primary) hypertension: Secondary | ICD-10-CM | POA: Diagnosis not present

## 2017-04-26 DIAGNOSIS — J3489 Other specified disorders of nose and nasal sinuses: Secondary | ICD-10-CM | POA: Diagnosis not present

## 2017-04-26 DIAGNOSIS — Z7901 Long term (current) use of anticoagulants: Secondary | ICD-10-CM | POA: Diagnosis not present

## 2017-04-26 DIAGNOSIS — R600 Localized edema: Secondary | ICD-10-CM | POA: Diagnosis not present

## 2017-04-26 DIAGNOSIS — E119 Type 2 diabetes mellitus without complications: Secondary | ICD-10-CM | POA: Diagnosis not present

## 2017-04-26 DIAGNOSIS — M79605 Pain in left leg: Secondary | ICD-10-CM | POA: Diagnosis not present

## 2017-04-26 DIAGNOSIS — Z96619 Presence of unspecified artificial shoulder joint: Secondary | ICD-10-CM | POA: Diagnosis not present

## 2017-04-26 DIAGNOSIS — M25552 Pain in left hip: Secondary | ICD-10-CM | POA: Diagnosis not present

## 2017-04-26 DIAGNOSIS — I4891 Unspecified atrial fibrillation: Secondary | ICD-10-CM | POA: Diagnosis not present

## 2017-04-26 DIAGNOSIS — Z90710 Acquired absence of both cervix and uterus: Secondary | ICD-10-CM | POA: Diagnosis not present

## 2017-04-26 DIAGNOSIS — Z9049 Acquired absence of other specified parts of digestive tract: Secondary | ICD-10-CM | POA: Diagnosis not present

## 2017-04-26 DIAGNOSIS — E039 Hypothyroidism, unspecified: Secondary | ICD-10-CM | POA: Diagnosis not present

## 2017-04-26 DIAGNOSIS — R0981 Nasal congestion: Secondary | ICD-10-CM | POA: Diagnosis not present

## 2017-04-26 DIAGNOSIS — M79662 Pain in left lower leg: Secondary | ICD-10-CM | POA: Diagnosis not present

## 2017-04-30 ENCOUNTER — Telehealth: Payer: Self-pay | Admitting: Cardiovascular Disease

## 2017-04-30 DIAGNOSIS — H353131 Nonexudative age-related macular degeneration, bilateral, early dry stage: Secondary | ICD-10-CM | POA: Diagnosis not present

## 2017-04-30 DIAGNOSIS — E113293 Type 2 diabetes mellitus with mild nonproliferative diabetic retinopathy without macular edema, bilateral: Secondary | ICD-10-CM | POA: Diagnosis not present

## 2017-04-30 NOTE — Telephone Encounter (Signed)
S/w patient. She is requesting samples of Xarelto. She says it is difficult for her to afford the Xarelto; however, she does not want to switch to Coumadin yet. I offered her patient assistance paperwork and encouraged her to complete. She will get it at her next appointment. She says she usually uses the samples to limit how much she has to refill her medications. I explained that samples are supposed to be used for new patient's starting on the medication mainly. I was very compassionate and apologized and encouraged her we will help her with getting patient assistance paper going. She has upcoming appointment with Dr Rockey Situ 05/10/17. She will discuss with Dr Rockey Situ at that time.

## 2017-04-30 NOTE — Telephone Encounter (Signed)
Patient calling the office for samples of medication:   1.  What medication and dosage are you requesting samples for? Xarleto  2.  Are you currently out of this medication?  She has this week

## 2017-04-30 NOTE — Telephone Encounter (Signed)
Jodi Reeves is requesting samples of Xarelto.  Patient had mentioned switching Xarelto to Warfarin in Oct. 2018 due to expense.  Please see telephone note and contact patient for instructions.

## 2017-05-01 DIAGNOSIS — J019 Acute sinusitis, unspecified: Secondary | ICD-10-CM | POA: Diagnosis not present

## 2017-05-01 DIAGNOSIS — R0982 Postnasal drip: Secondary | ICD-10-CM | POA: Diagnosis not present

## 2017-05-01 DIAGNOSIS — R05 Cough: Secondary | ICD-10-CM | POA: Diagnosis not present

## 2017-05-04 DIAGNOSIS — R69 Illness, unspecified: Secondary | ICD-10-CM | POA: Diagnosis not present

## 2017-05-08 DIAGNOSIS — I700 Atherosclerosis of aorta: Secondary | ICD-10-CM | POA: Insufficient documentation

## 2017-05-08 NOTE — Progress Notes (Signed)
Cardiology Office Note  Date:  05/10/2017   ID:  Jodi Reeves, DOB 15-Aug-1927, MRN [id number]  PCP:  Lavonne Chick, MD   Chief Complaint  Patient presents with  . OTher    Follow up from Stress test. Patient c/o swelling. Meds reviewed verbally with patient.     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  CT chest 04/20/2016, Calcifications of the aortic arch, mild calcification of the coronary arteries. Moderate calcifications of the abdominal aorta, which is tortuous. Previous stretching of esophagus with Dr. Candace Cruise Chronic leg swelling, venous She presents for follow-up of her atrial fibrillation and chest pain  Unable to complete carpal tunnel release Delayed secondary to sinus infection  Recent Stress test April 06, 2017 Low risk scan, no significant ischemia There was equivocal EKG changes  CT scan chest March 18, 2017 with cardiomegaly and coronary artery calcification, aortic atherosclerosis  BP low at home 120-130/70, Heart rate 70 (here is 57 bpm)  She had concern over leg edema Reports that she went to outside hospital had lower extremity venous Doppler Told her scan was normal  Unsteady gait, uses a cane No recent falls No exercise program  Chronic GI issues Previously had diarrhea, now with constipation. Working with GI  EKG personally reviewed by myself on todays visit Shows atrial fibrillation with ventricular rate 57 bpm no significant ST or T-wave changes  Labs reviewed in detail  HBA1C 7.9  Other past medical history reviewed  Went to the ER 04/19/2016,  complaints of Chest pain, difficulty swallowing  sounded more esophageal per the notes several days of difficulty with swallowing food and medicine.  Her  troponins returned negative, her EKGs with no changes CT scan of the chest performed, limited echocardiogram performed  On day of discharge, she ate normal food and had no difficulty with dysphagia or pain. She was discharged home.  CT chest 04/20/2016: Calcifications of the aortic arch. mild calcification of the coronary arteries. Moderate calcifications of the abdominal aorta, which is tortuous. The origins of the celiac, SMA, single bilateral renal arteries are patent. The origin of the IMA is not seen, and may be occluded chronically.  Results of ER echo (limited) 04/19/2016 No sonographic evidence of significant cardiac dysfunction, No sonographic  evidence of significant pericardial effusion, Normal RV, No sonographic  evidence of volume depletion and Pericardial effusion:with a SMALL  Effusion  TIA in January 2017, went to [facility] for evaluation She was talking with a friend, had word finding difficulty, friend was finishing her sentences Workup at [facility] reviewed She had CT scan of her head which was essentially normal,  carotid ultrasound that showed mild bilateral disease, no significant stenoses,  echocardiogram with ejection fraction greater than 65%, mild valve abnormality  Previous total cholesterol 201, LDL 107, hemoglobin A1c 6.9  Diabetes managed by Dr. 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    PMH:   has a past medical history of Anxiety, Balance disorder, Chronic diastolic CHF (congestive heart failure) ([place]), Coronary  artery disease, non-occlusive, DM2 (diabetes mellitus, type 2) ([place]), GERD (gastroesophageal reflux disease), Granuloma annulare, HLD (hyperlipidemia), HTN (hypertension), Hypothyroidism, Incontinence of bowel, Osteoporosis, Ovarian cancer ([place]), Persistent atrial fibrillation ([place]), TIA (transient ischemic attack), and Venous insufficiency.  PSH:    Past Surgical History:  Procedure Laterality Date  . 2nd look laparotomy  1982  . ABDOMINAL HYSTERECTOMY    . CARDIAC CATHETERIZATION  8/05   neg; a fib found   . cataract OD  2002  . cataract surgery  5/07   R  . CHOLECYSTECTOMY  1986  . DEXA  9/04 and 1/02  . EGD/dilation/colon  1/06  . ESOPHAGOGASTRODUODENOSCOPY (EGD) WITH PROPOFOL N/A 05/16/2016   Procedure: ESOPHAGOGASTRODUODENOSCOPY (EGD) WITH PROPOFOL with dilation;  Surgeon: Lucilla Lame, MD;  Location: [facility] ENDOSCOPY;  Service: Endoscopy;  Laterality: N/A;  . FEMORAL HERNIA REPAIR  1958   R  . fracture L elbow/wrist  2000  . intussception/obstruction  12/98  . kidney stone x2  1991  . laminectomy L4-5  1971  . LUMBAR LAMINECTOMY    . MOUTH SURGERY    . myoview stress  8/05   syncope (-)  . REVERSE SHOULDER ARTHROPLASTY Left 10/06/2014   Procedure: REVERSE SHOULDER ARTHROPLASTY;  Surgeon: Corky Mull, MD;  Location: [facility] ORS;  Service: Orthopedics;  Laterality: Left;  . stillbirth  1969  . TOTAL ABDOMINAL HYSTERECTOMY W/ BILATERAL SALPINGOOPHORECTOMY  1980   ovarian cancer  . WRIST FRACTURE SURGERY  11/05   R    Current Outpatient Medications  Medication Sig Dispense Refill  . acetaminophen (TYLENOL) 500 MG tablet Take 1,000 mg by mouth every 6 (six) hours as needed for moderate pain or headache.    . Ascorbic Acid (VITAMIN C) POWD Take 1 Dose by mouth daily.     . bisoprolol-hydrochlorothiazide (ZIAC) 2.5-6.25 MG tablet TAKE ONE TABLET BY MOUTH EVERY DAY 90 tablet 3  . Cholecalciferol (VITAMIN D3) 5000 units CAPS Take 5,000 Units by mouth daily.     Marland Kitchen COENZYME Q10 PO Take  1 capsule by mouth daily.     . digoxin (LANOXIN) 0.125 MG tablet TAKE ONE (1) TABLET BY MOUTH EVERY DAY (Patient taking differently: TAKE 0.125 MG BY MOUTH EVERY DAY) 90 tablet 3  . famotidine (PEPCID) 20 MG tablet Take 20 mg by mouth daily as needed for heartburn.     . Folic Acid-Vit A6-TKZ S01 (FA-VITAMIN B-6-VITAMIN B-12) 2.2-25-0.5 MG TABS Place 1 tablet under the tongue daily.     . furosemide (LASIX) 20 MG tablet Take 1 tablet (20 mg total) by mouth 2 (two) times daily as needed. (Patient taking differently: Take 20 mg by mouth as needed. Take 20 mg by mouth daily. May take additional 20 mg if needed for fluid) 180 tablet 3  . glipiZIDE (GLUCOTROL XL) 2.5 MG 24 hr tablet Take 2.5 mg by mouth daily with breakfast.    . glucose blood (ONE TOUCH ULTRA TEST) test strip Use to test blood sugar once daily E11.49 100 each 1  . insulin NPH Human (HUMULIN N,NOVOLIN N) 100 UNIT/ML injection Inject 10 Units into the skin at bedtime.     Marland Kitchen levothyroxine (SYNTHROID, LEVOTHROID) 50 MCG tablet Take 50 mcg by mouth daily before breakfast.    . Magnesium 250 MG TABS Take 250 mg by mouth  daily.    . metFORMIN (GLUCOPHAGE-XR) 500 MG 24 hr tablet Take 500 mg by mouth daily with breakfast.    . Multiple Vitamin (CALCIUM COMPLEX PO) Take 1 tablet by mouth daily.    . naproxen sodium (ALEVE) 220 MG tablet Take 220 mg by mouth 2 (two) times daily as needed (for pain or headache).    . Omega-3 1000 MG CAPS Take 2,000 mg by mouth daily.     Vladimir Faster Glycol-Propyl Glycol (SYSTANE OP) Place 1-2 drops into both eyes daily as needed (for seasonal allergies).    . potassium chloride (K-DUR) 10 MEQ tablet Take 1 tablet (10 mEq total) by mouth daily as needed. (Patient taking differently: Take 10 mEq by mouth daily as needed (with extra Lasix). ) 30 tablet 3  . Vitamins A & D (VITAMIN A & D) ointment Apply 1 application topically as needed for dry skin or lip care.    Alveda Reasons 20 MG TABS tablet TAKE ONE (1) TABLET BY  MOUTH EVERY DAY (Patient taking differently: TAKE 20 MG BY MOUTH EVERY DAY AT NIGHT) 90 tablet 3   No current facility-administered medications for this visit.      Allergies:   Penicillins; Amiodarone; Atorvastatin; Ciprofloxacin; Codeine; Ezetimibe-simvastatin; Flomax [tamsulosin]; Lovastatin; Rosiglitazone maleate; Statins; Alendronate sodium; and Sulfamethoxazole-trimethoprim   Social History:  The patient  reports that  has never smoked. she has never used smokeless tobacco. She reports that she does not drink alcohol or use drugs.   Family History:   Family history is unknown by patient.    Review of Systems: Review of Systems  Respiratory: Negative.   Cardiovascular: Positive for leg swelling.  Gastrointestinal: Negative.        Dysphagia  Musculoskeletal: Negative.        Unsteady gait  Neurological: Positive for weakness.  Psychiatric/Behavioral: Negative.   All other systems reviewed and are negative.    PHYSICAL EXAM: VS:  BP (!) 145/79 (BP Location: Right Arm, Patient Position: Sitting, Cuff Size: Normal)   Pulse (!) 57   Ht 5\' 3"  (1.6 m)   Wt 164 lb (74.4 kg)   BMI 29.05 kg/m  , BMI Body mass index is 29.05 kg/m. GEN: Well nourished, well developed, in no acute distress, obese , Uses a cane  HEENT: normal  Neck: no JVD, carotid bruits, or masses Cardiac: Irregularly irregular, no murmurs, rubs, or gallops, nonpitting lower extremity edema  Respiratory:  clear to auscultation bilaterally, normal work of breathing GI: soft, nontender, nondistended, + BS MS: no deformity or atrophy  Skin: warm and dry, no rash, significant ecchymosis left foot second toe , moderate swelling  Neuro:  Strength and sensation are intact Psych: euthymic mood, full affect    Recent Labs: 03/18/2017: BUN 15; Creatinine, Ser 0.63; Hemoglobin 13.6; Platelets 158; Potassium 3.6; Sodium 141    Lipid Panel Lab Results  Component Value Date   CHOL 209 (H) 02/24/2014   HDL 51.60  02/24/2014   LDLCALC 124 (H) 02/24/2014   TRIG 168.0 (H) 02/24/2014      Wt Readings from Last 3 Encounters:  05/10/17 164 lb (74.4 kg)  03/29/17 161 lb 4 oz (73.1 kg)  03/18/17 160 lb (72.6 kg)       ASSESSMENT AND PLAN:   Mixed hyperlipidemia  CT scan including mild coronary calcifications, aortic arch calcifications, moderate aortic atherosclerosis in the abdominal aorta History of myalgias, does not want a statin stable  Essential hypertension, benign - Plan: EKG 12-Lead Blood  pressure is well controlled on today's visit. No changes made to the medications. Stable  Persistent atrial fibrillation (HCC) Heart rate well controlled, tolerating anticoagulation She can hold her anticoagulation prior to carpal tunnel surgery end of the month  Chronic diastolic CHF (congestive heart failure) ([place]) Takes Lasix daily, occasionally with extra dose Recommended she take potassium daily   Total encounter time more than 25 minutes  Greater than 50% was spent in counseling and coordination of care with the patient   Disposition:   F/U  12 months   No orders of the defined types were placed in this encounter.    Signed, Esmond Plants, M.D., Ph.D. 05/10/2017  [place], [place]

## 2017-05-10 ENCOUNTER — Encounter: Payer: Self-pay | Admitting: Cardiovascular Disease

## 2017-05-10 ENCOUNTER — Ambulatory Visit: Payer: Medicare HMO | Admitting: Cardiovascular Disease

## 2017-05-10 VITALS — BP 145/79 | HR 57 | Ht 63.0 in | Wt 164.0 lb

## 2017-05-10 DIAGNOSIS — I481 Persistent atrial fibrillation: Secondary | ICD-10-CM | POA: Diagnosis not present

## 2017-05-10 DIAGNOSIS — E782 Mixed hyperlipidemia: Secondary | ICD-10-CM

## 2017-05-10 DIAGNOSIS — I5032 Chronic diastolic (congestive) heart failure: Secondary | ICD-10-CM

## 2017-05-10 DIAGNOSIS — I10 Essential (primary) hypertension: Secondary | ICD-10-CM

## 2017-05-10 DIAGNOSIS — I4819 Other persistent atrial fibrillation: Secondary | ICD-10-CM

## 2017-05-10 DIAGNOSIS — E1149 Type 2 diabetes mellitus with other diabetic neurological complication: Secondary | ICD-10-CM

## 2017-05-10 DIAGNOSIS — I700 Atherosclerosis of aorta: Secondary | ICD-10-CM

## 2017-05-10 NOTE — Patient Instructions (Addendum)
Medication Instructions:   Please hold the digoxin Monitor heart rate Potassium daily except weekends  Stop xarelto   Labwork:  No new labs needed  Testing/Procedures:  No further testing at this time   Follow-Up: It was a pleasure seeing you in the office today. Please call us if you have new issues that need to be addressed before your next appt.  [phone]  Your physician wants you to follow-up in: 12 months.  You will receive a reminder letter in the mail two months in advance. If you don't receive a letter, please call our office to schedule the follow-up appointment.  If you need a refill on your cardiac medications before your next appointment, please call your pharmacy.

## 2017-05-11 DIAGNOSIS — S300XXA Contusion of lower back and pelvis, initial encounter: Secondary | ICD-10-CM | POA: Diagnosis not present

## 2017-05-13 DIAGNOSIS — I2510 Atherosclerotic heart disease of native coronary artery without angina pectoris: Secondary | ICD-10-CM | POA: Diagnosis not present

## 2017-05-13 DIAGNOSIS — M47816 Spondylosis without myelopathy or radiculopathy, lumbar region: Secondary | ICD-10-CM | POA: Diagnosis not present

## 2017-05-13 DIAGNOSIS — H05222 Edema of left orbit: Secondary | ICD-10-CM | POA: Diagnosis not present

## 2017-05-13 DIAGNOSIS — E1140 Type 2 diabetes mellitus with diabetic neuropathy, unspecified: Secondary | ICD-10-CM | POA: Diagnosis not present

## 2017-05-13 DIAGNOSIS — S3993XA Unspecified injury of pelvis, initial encounter: Secondary | ICD-10-CM | POA: Diagnosis not present

## 2017-05-13 DIAGNOSIS — I5030 Unspecified diastolic (congestive) heart failure: Secondary | ICD-10-CM | POA: Diagnosis not present

## 2017-05-13 DIAGNOSIS — S2249XA Multiple fractures of ribs, unspecified side, initial encounter for closed fracture: Secondary | ICD-10-CM | POA: Diagnosis not present

## 2017-05-13 DIAGNOSIS — I110 Hypertensive heart disease with heart failure: Secondary | ICD-10-CM | POA: Diagnosis not present

## 2017-05-13 DIAGNOSIS — S0003XA Contusion of scalp, initial encounter: Secondary | ICD-10-CM | POA: Diagnosis not present

## 2017-05-13 DIAGNOSIS — E1149 Type 2 diabetes mellitus with other diabetic neurological complication: Secondary | ICD-10-CM | POA: Diagnosis not present

## 2017-05-13 DIAGNOSIS — R296 Repeated falls: Secondary | ICD-10-CM | POA: Diagnosis not present

## 2017-05-13 DIAGNOSIS — R809 Proteinuria, unspecified: Secondary | ICD-10-CM | POA: Diagnosis not present

## 2017-05-13 DIAGNOSIS — E119 Type 2 diabetes mellitus without complications: Secondary | ICD-10-CM | POA: Diagnosis not present

## 2017-05-13 DIAGNOSIS — M810 Age-related osteoporosis without current pathological fracture: Secondary | ICD-10-CM | POA: Diagnosis not present

## 2017-05-13 DIAGNOSIS — I081 Rheumatic disorders of both mitral and tricuspid valves: Secondary | ICD-10-CM | POA: Diagnosis not present

## 2017-05-13 DIAGNOSIS — M8580 Other specified disorders of bone density and structure, unspecified site: Secondary | ICD-10-CM | POA: Diagnosis not present

## 2017-05-13 DIAGNOSIS — M5030 Other cervical disc degeneration, unspecified cervical region: Secondary | ICD-10-CM | POA: Diagnosis not present

## 2017-05-13 DIAGNOSIS — S3210XA Unspecified fracture of sacrum, initial encounter for closed fracture: Secondary | ICD-10-CM | POA: Diagnosis not present

## 2017-05-13 DIAGNOSIS — I4891 Unspecified atrial fibrillation: Secondary | ICD-10-CM | POA: Diagnosis not present

## 2017-05-13 DIAGNOSIS — M419 Scoliosis, unspecified: Secondary | ICD-10-CM | POA: Diagnosis not present

## 2017-05-13 DIAGNOSIS — R918 Other nonspecific abnormal finding of lung field: Secondary | ICD-10-CM | POA: Diagnosis not present

## 2017-05-13 DIAGNOSIS — W010XXA Fall on same level from slipping, tripping and stumbling without subsequent striking against object, initial encounter: Secondary | ICD-10-CM | POA: Diagnosis not present

## 2017-05-13 DIAGNOSIS — R220 Localized swelling, mass and lump, head: Secondary | ICD-10-CM | POA: Diagnosis not present

## 2017-05-13 DIAGNOSIS — R2681 Unsteadiness on feet: Secondary | ICD-10-CM | POA: Diagnosis not present

## 2017-05-13 DIAGNOSIS — J984 Other disorders of lung: Secondary | ICD-10-CM | POA: Diagnosis not present

## 2017-05-13 DIAGNOSIS — I10 Essential (primary) hypertension: Secondary | ICD-10-CM | POA: Diagnosis not present

## 2017-05-13 DIAGNOSIS — W19XXXA Unspecified fall, initial encounter: Secondary | ICD-10-CM | POA: Diagnosis not present

## 2017-05-13 DIAGNOSIS — S2241XA Multiple fractures of ribs, right side, initial encounter for closed fracture: Secondary | ICD-10-CM | POA: Diagnosis not present

## 2017-05-13 DIAGNOSIS — M545 Low back pain: Secondary | ICD-10-CM | POA: Diagnosis not present

## 2017-05-13 DIAGNOSIS — M25551 Pain in right hip: Secondary | ICD-10-CM | POA: Diagnosis not present

## 2017-05-13 DIAGNOSIS — E039 Hypothyroidism, unspecified: Secondary | ICD-10-CM | POA: Diagnosis not present

## 2017-05-13 DIAGNOSIS — G459 Transient cerebral ischemic attack, unspecified: Secondary | ICD-10-CM | POA: Diagnosis not present

## 2017-05-13 DIAGNOSIS — I700 Atherosclerosis of aorta: Secondary | ICD-10-CM | POA: Diagnosis not present

## 2017-05-13 DIAGNOSIS — I5032 Chronic diastolic (congestive) heart failure: Secondary | ICD-10-CM | POA: Diagnosis not present

## 2017-05-13 DIAGNOSIS — W06XXXA Fall from bed, initial encounter: Secondary | ICD-10-CM | POA: Diagnosis not present

## 2017-05-13 DIAGNOSIS — S3219XA Other fracture of sacrum, initial encounter for closed fracture: Secondary | ICD-10-CM | POA: Diagnosis not present

## 2017-05-13 DIAGNOSIS — S299XXA Unspecified injury of thorax, initial encounter: Secondary | ICD-10-CM | POA: Diagnosis not present

## 2017-05-13 DIAGNOSIS — S3991XA Unspecified injury of abdomen, initial encounter: Secondary | ICD-10-CM | POA: Diagnosis not present

## 2017-05-15 ENCOUNTER — Inpatient Hospital Stay: Admission: RE | Admit: 2017-05-15 | Payer: Medicare HMO | Source: Ambulatory Visit

## 2017-05-16 ENCOUNTER — Encounter
Admission: RE | Admit: 2017-05-16 | Discharge: 2017-05-16 | Disposition: A | Discharge status: Home / Self Care | Payer: Medicare HMO | Source: Ambulatory Visit | Attending: Internal Medicine | Admitting: Internal Medicine

## 2017-05-23 ENCOUNTER — Ambulatory Visit: Admission: RE | Admit: 2017-05-23 | Payer: Medicare HMO | Source: Ambulatory Visit | Admitting: Specialist

## 2017-05-23 ENCOUNTER — Encounter: Admission: RE | Payer: Self-pay | Source: Ambulatory Visit

## 2017-05-23 SURGERY — CARPAL TUNNEL RELEASE
Anesthesia: Choice | Laterality: Left

## 2017-05-24 DIAGNOSIS — I110 Hypertensive heart disease with heart failure: Secondary | ICD-10-CM | POA: Diagnosis not present

## 2017-05-24 DIAGNOSIS — S3210XD Unspecified fracture of sacrum, subsequent encounter for fracture with routine healing: Secondary | ICD-10-CM | POA: Diagnosis not present

## 2017-05-24 DIAGNOSIS — S2241XD Multiple fractures of ribs, right side, subsequent encounter for fracture with routine healing: Secondary | ICD-10-CM | POA: Diagnosis not present

## 2017-05-24 DIAGNOSIS — I5032 Chronic diastolic (congestive) heart failure: Secondary | ICD-10-CM | POA: Diagnosis not present

## 2017-05-24 DIAGNOSIS — E1142 Type 2 diabetes mellitus with diabetic polyneuropathy: Secondary | ICD-10-CM | POA: Diagnosis not present

## 2017-05-24 DIAGNOSIS — I4891 Unspecified atrial fibrillation: Secondary | ICD-10-CM | POA: Diagnosis not present

## 2017-05-25 ENCOUNTER — Telehealth: Payer: Self-pay | Admitting: Cardiovascular Disease

## 2017-05-25 DIAGNOSIS — S2241XD Multiple fractures of ribs, right side, subsequent encounter for fracture with routine healing: Secondary | ICD-10-CM | POA: Diagnosis not present

## 2017-05-25 DIAGNOSIS — S3210XD Unspecified fracture of sacrum, subsequent encounter for fracture with routine healing: Secondary | ICD-10-CM | POA: Diagnosis not present

## 2017-05-25 DIAGNOSIS — I110 Hypertensive heart disease with heart failure: Secondary | ICD-10-CM | POA: Diagnosis not present

## 2017-05-25 DIAGNOSIS — E1142 Type 2 diabetes mellitus with diabetic polyneuropathy: Secondary | ICD-10-CM | POA: Diagnosis not present

## 2017-05-25 DIAGNOSIS — I5032 Chronic diastolic (congestive) heart failure: Secondary | ICD-10-CM | POA: Diagnosis not present

## 2017-05-25 DIAGNOSIS — I4891 Unspecified atrial fibrillation: Secondary | ICD-10-CM | POA: Diagnosis not present

## 2017-05-25 MED ORDER — POLYETHYLENE GLYCOL 3350 17 G PO PACK
17.00 | PACK | ORAL | Status: DC
Start: 2017-05-24 — End: 2017-05-25

## 2017-05-25 MED ORDER — ENOXAPARIN SODIUM 30 MG/0.3ML ~~LOC~~ SOLN
30.00 mg | SUBCUTANEOUS | Status: DC
Start: 2017-05-23 — End: 2017-05-25

## 2017-05-25 MED ORDER — FUROSEMIDE 20 MG PO TABS
20.00 mg | ORAL_TABLET | ORAL | Status: DC
Start: 2017-05-25 — End: 2017-05-25

## 2017-05-25 MED ORDER — METOPROLOL SUCCINATE ER 50 MG PO TB24
50.00 mg | ORAL_TABLET | ORAL | Status: DC
Start: 2017-05-24 — End: 2017-05-25

## 2017-05-25 MED ORDER — CALCIUM CARBONATE 600 MG PO TABS
ORAL_TABLET | ORAL | Status: DC
Start: 2017-05-24 — End: 2017-05-25

## 2017-05-25 MED ORDER — GENERIC EXTERNAL MEDICATION
Status: DC
Start: ? — End: 2017-05-25

## 2017-05-25 MED ORDER — INSULIN REGULAR HUMAN 100 UNIT/ML IJ SOLN
0.00 | INTRAMUSCULAR | Status: DC
Start: 2017-05-23 — End: 2017-05-25

## 2017-05-25 MED ORDER — LEVOTHYROXINE SODIUM 25 MCG PO TABS
50.00 | ORAL_TABLET | ORAL | Status: DC
Start: 2017-05-24 — End: 2017-05-25

## 2017-05-25 MED ORDER — CHOLECALCIFEROL 25 MCG (1000 UT) PO TABS
2000.00 | ORAL_TABLET | ORAL | Status: DC
Start: 2017-05-24 — End: 2017-05-25

## 2017-05-25 MED ORDER — POTASSIUM CHLORIDE CRYS ER 10 MEQ PO TBCR
10.00 | EXTENDED_RELEASE_TABLET | ORAL | Status: DC
Start: 2017-05-24 — End: 2017-05-25

## 2017-05-25 MED ORDER — DEXTROSE 50 % IV SOLN
12.50 | INTRAVENOUS | Status: DC
Start: ? — End: 2017-05-25

## 2017-05-25 MED ORDER — ONDANSETRON 4 MG PO TBDP
4.00 mg | ORAL_TABLET | ORAL | Status: DC
Start: ? — End: 2017-05-25

## 2017-05-25 MED ORDER — ACETAMINOPHEN 325 MG PO TABS
650.00 mg | ORAL_TABLET | ORAL | Status: DC
Start: 2017-05-23 — End: 2017-05-25

## 2017-05-25 MED ORDER — DOCUSATE SODIUM 100 MG PO CAPS
100.00 mg | ORAL_CAPSULE | ORAL | Status: DC
Start: 2017-05-23 — End: 2017-05-25

## 2017-05-25 NOTE — Telephone Encounter (Signed)
Jodi Reeves from [facility] is calling She would like to know if Dr Rockey Situ would be able to sign patients home health orders Patient does not have a PCP Please call to discuss

## 2017-05-25 NOTE — Telephone Encounter (Signed)
I spoke with the patient. She reports that she had a fall on 05/11/17 (the day after seeing Dr. Rockey Situ)- she fell backwards for an unknown reason.  She fell again on 1/20 off the bed. She has been hospitalized at [facility] until yesterday with a fractured tail bone and ribs and a hematoma to her forehead. She was on lovenox injections while at [facility]. She was given 1 dose yesterday before discharge. She called today stating she was not sent home with lovenox or instructions to re-start xarelto.  She has a history of a-fib and TIA's.  She is scheduled to see Dr. Rockey Situ on 05/30/17. Reviewed the patient's xarelto with Thurmond Butts, [place]. Per Ryan's review of [facility] records, the patient was instructed to hold xarelto until she followed up with Dr. Rockey Situ. Per Thurmond Butts, [place] the patient should hold xarelto until seen by Dr. Rockey Situ as per [facility]: [facility] W discharge instructions.  I have reviewed this with the patient and she voices understanding.  She is aware of her follow up on 05/30/17 with Dr. Rockey Situ and that the discussion can be had at that time as to whether she should continue xarelto or not in light of her multiple falls. She is agreeable.

## 2017-05-25 NOTE — Telephone Encounter (Signed)
Pt needs to discuss her Xarelto, and if she should continue it. States she has been in the hospital due to a fall. Please call to discuss.

## 2017-05-25 NOTE — Telephone Encounter (Signed)
To Dr. Bluford Kaufmann, RN to review. Patient recently discharged from [facility] due to falls- see additional phone note dated from today. She is scheduled to follow up on 05/30/17 with Dr. Rockey Situ.

## 2017-05-26 NOTE — Telephone Encounter (Signed)
I can sign orders Can we set her up with a PMD, thx

## 2017-05-28 DIAGNOSIS — S2241XD Multiple fractures of ribs, right side, subsequent encounter for fracture with routine healing: Secondary | ICD-10-CM | POA: Diagnosis not present

## 2017-05-28 DIAGNOSIS — S3210XD Unspecified fracture of sacrum, subsequent encounter for fracture with routine healing: Secondary | ICD-10-CM | POA: Diagnosis not present

## 2017-05-28 DIAGNOSIS — I4891 Unspecified atrial fibrillation: Secondary | ICD-10-CM | POA: Diagnosis not present

## 2017-05-28 DIAGNOSIS — E1142 Type 2 diabetes mellitus with diabetic polyneuropathy: Secondary | ICD-10-CM | POA: Diagnosis not present

## 2017-05-28 DIAGNOSIS — I5032 Chronic diastolic (congestive) heart failure: Secondary | ICD-10-CM | POA: Diagnosis not present

## 2017-05-28 DIAGNOSIS — I110 Hypertensive heart disease with heart failure: Secondary | ICD-10-CM | POA: Diagnosis not present

## 2017-05-28 NOTE — Telephone Encounter (Signed)
Left voicemail message to call back  

## 2017-05-28 NOTE — Telephone Encounter (Signed)
Spoke with Chrys Racer from [facility] and advised that we highly recommend that patients PCP sign for any home health so they can manage any needs that may arise. She reports that patient needs to reestablish primary care with provider and will then be able to arrange through them. She states that [facility] signed orders for home health at this time and they will work with getting patient appointment with PCP. Confirmed upcoming appointment with our office for Wednesday and she had no further questions at this time.
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
## 2017-05-30 ENCOUNTER — Ambulatory Visit: Payer: Medicare HMO | Admitting: Cardiovascular Disease

## 2017-05-30 ENCOUNTER — Encounter: Payer: Self-pay | Admitting: Cardiovascular Disease

## 2017-05-30 VITALS — BP 140/60 | HR 87 | Ht 63.0 in | Wt 154.0 lb

## 2017-05-30 DIAGNOSIS — E1149 Type 2 diabetes mellitus with other diabetic neurological complication: Secondary | ICD-10-CM | POA: Diagnosis not present

## 2017-05-30 DIAGNOSIS — I5032 Chronic diastolic (congestive) heart failure: Secondary | ICD-10-CM | POA: Diagnosis not present

## 2017-05-30 DIAGNOSIS — I4891 Unspecified atrial fibrillation: Secondary | ICD-10-CM | POA: Diagnosis not present

## 2017-05-30 DIAGNOSIS — I700 Atherosclerosis of aorta: Secondary | ICD-10-CM | POA: Diagnosis not present

## 2017-05-30 DIAGNOSIS — R079 Chest pain, unspecified: Secondary | ICD-10-CM

## 2017-05-30 DIAGNOSIS — I481 Persistent atrial fibrillation: Secondary | ICD-10-CM

## 2017-05-30 DIAGNOSIS — I10 Essential (primary) hypertension: Secondary | ICD-10-CM | POA: Diagnosis not present

## 2017-05-30 DIAGNOSIS — E1142 Type 2 diabetes mellitus with diabetic polyneuropathy: Secondary | ICD-10-CM | POA: Diagnosis not present

## 2017-05-30 DIAGNOSIS — I110 Hypertensive heart disease with heart failure: Secondary | ICD-10-CM | POA: Diagnosis not present

## 2017-05-30 DIAGNOSIS — I2510 Atherosclerotic heart disease of native coronary artery without angina pectoris: Secondary | ICD-10-CM | POA: Diagnosis not present

## 2017-05-30 DIAGNOSIS — I4819 Other persistent atrial fibrillation: Secondary | ICD-10-CM

## 2017-05-30 DIAGNOSIS — E782 Mixed hyperlipidemia: Secondary | ICD-10-CM

## 2017-05-30 DIAGNOSIS — S2241XD Multiple fractures of ribs, right side, subsequent encounter for fracture with routine healing: Secondary | ICD-10-CM | POA: Diagnosis not present

## 2017-05-30 DIAGNOSIS — R600 Localized edema: Secondary | ICD-10-CM

## 2017-05-30 DIAGNOSIS — S3210XD Unspecified fracture of sacrum, subsequent encounter for fracture with routine healing: Secondary | ICD-10-CM | POA: Diagnosis not present

## 2017-05-30 NOTE — Patient Instructions (Addendum)
Medication Instructions:   Consider starting xarelto one a day  Labwork:  No new labs needed  Testing/Procedures:  No further testing at this time   Follow-Up: It was a pleasure seeing you in the office today. Please call us if you have new issues that need to be addressed before your next appt.  [phone]  Your physician wants you to follow-up in: 6 months.  You will receive a reminder letter in the mail two months in advance. If you don't receive a letter, please call our office to schedule the follow-up appointment.  If you need a refill on your cardiac medications before your next appointment, please call your pharmacy.

## 2017-06-01 DIAGNOSIS — I5032 Chronic diastolic (congestive) heart failure: Secondary | ICD-10-CM | POA: Diagnosis not present

## 2017-06-01 DIAGNOSIS — E1142 Type 2 diabetes mellitus with diabetic polyneuropathy: Secondary | ICD-10-CM | POA: Diagnosis not present

## 2017-06-01 DIAGNOSIS — S2241XD Multiple fractures of ribs, right side, subsequent encounter for fracture with routine healing: Secondary | ICD-10-CM | POA: Diagnosis not present

## 2017-06-01 DIAGNOSIS — I110 Hypertensive heart disease with heart failure: Secondary | ICD-10-CM | POA: Diagnosis not present

## 2017-06-01 DIAGNOSIS — I4891 Unspecified atrial fibrillation: Secondary | ICD-10-CM | POA: Diagnosis not present

## 2017-06-01 DIAGNOSIS — S3210XD Unspecified fracture of sacrum, subsequent encounter for fracture with routine healing: Secondary | ICD-10-CM | POA: Diagnosis not present

## 2017-06-04 ENCOUNTER — Ambulatory Visit: Payer: Medicare HMO | Admitting: Podiatry

## 2017-06-04 DIAGNOSIS — I4891 Unspecified atrial fibrillation: Secondary | ICD-10-CM | POA: Diagnosis not present

## 2017-06-04 DIAGNOSIS — S2241XD Multiple fractures of ribs, right side, subsequent encounter for fracture with routine healing: Secondary | ICD-10-CM | POA: Diagnosis not present

## 2017-06-04 DIAGNOSIS — E1142 Type 2 diabetes mellitus with diabetic polyneuropathy: Secondary | ICD-10-CM | POA: Diagnosis not present

## 2017-06-04 DIAGNOSIS — S3210XD Unspecified fracture of sacrum, subsequent encounter for fracture with routine healing: Secondary | ICD-10-CM | POA: Diagnosis not present

## 2017-06-04 DIAGNOSIS — I110 Hypertensive heart disease with heart failure: Secondary | ICD-10-CM | POA: Diagnosis not present

## 2017-06-04 DIAGNOSIS — I5032 Chronic diastolic (congestive) heart failure: Secondary | ICD-10-CM | POA: Diagnosis not present

## 2017-06-05 DIAGNOSIS — I5032 Chronic diastolic (congestive) heart failure: Secondary | ICD-10-CM | POA: Diagnosis not present

## 2017-06-05 DIAGNOSIS — I4891 Unspecified atrial fibrillation: Secondary | ICD-10-CM | POA: Diagnosis not present

## 2017-06-05 DIAGNOSIS — S3210XD Unspecified fracture of sacrum, subsequent encounter for fracture with routine healing: Secondary | ICD-10-CM | POA: Diagnosis not present

## 2017-06-05 DIAGNOSIS — E1142 Type 2 diabetes mellitus with diabetic polyneuropathy: Secondary | ICD-10-CM | POA: Diagnosis not present

## 2017-06-05 DIAGNOSIS — I110 Hypertensive heart disease with heart failure: Secondary | ICD-10-CM | POA: Diagnosis not present

## 2017-06-05 DIAGNOSIS — S2241XD Multiple fractures of ribs, right side, subsequent encounter for fracture with routine healing: Secondary | ICD-10-CM | POA: Diagnosis not present

## 2017-06-06 DIAGNOSIS — S2241XD Multiple fractures of ribs, right side, subsequent encounter for fracture with routine healing: Secondary | ICD-10-CM | POA: Diagnosis not present

## 2017-06-06 DIAGNOSIS — E1142 Type 2 diabetes mellitus with diabetic polyneuropathy: Secondary | ICD-10-CM | POA: Diagnosis not present

## 2017-06-06 DIAGNOSIS — S3210XD Unspecified fracture of sacrum, subsequent encounter for fracture with routine healing: Secondary | ICD-10-CM | POA: Diagnosis not present

## 2017-06-06 DIAGNOSIS — I4891 Unspecified atrial fibrillation: Secondary | ICD-10-CM | POA: Diagnosis not present

## 2017-06-06 DIAGNOSIS — I110 Hypertensive heart disease with heart failure: Secondary | ICD-10-CM | POA: Diagnosis not present

## 2017-06-06 DIAGNOSIS — I5032 Chronic diastolic (congestive) heart failure: Secondary | ICD-10-CM | POA: Diagnosis not present

## 2017-06-08 ENCOUNTER — Telehealth: Payer: Self-pay | Admitting: Cardiovascular Disease

## 2017-06-08 DIAGNOSIS — E039 Hypothyroidism, unspecified: Secondary | ICD-10-CM | POA: Diagnosis not present

## 2017-06-08 DIAGNOSIS — I110 Hypertensive heart disease with heart failure: Secondary | ICD-10-CM | POA: Diagnosis not present

## 2017-06-08 DIAGNOSIS — I5032 Chronic diastolic (congestive) heart failure: Secondary | ICD-10-CM | POA: Diagnosis not present

## 2017-06-08 DIAGNOSIS — E1129 Type 2 diabetes mellitus with other diabetic kidney complication: Secondary | ICD-10-CM | POA: Diagnosis not present

## 2017-06-08 DIAGNOSIS — S3210XD Unspecified fracture of sacrum, subsequent encounter for fracture with routine healing: Secondary | ICD-10-CM | POA: Diagnosis not present

## 2017-06-08 DIAGNOSIS — E1142 Type 2 diabetes mellitus with diabetic polyneuropathy: Secondary | ICD-10-CM | POA: Diagnosis not present

## 2017-06-08 DIAGNOSIS — R809 Proteinuria, unspecified: Secondary | ICD-10-CM | POA: Diagnosis not present

## 2017-06-08 DIAGNOSIS — S2241XD Multiple fractures of ribs, right side, subsequent encounter for fracture with routine healing: Secondary | ICD-10-CM | POA: Diagnosis not present

## 2017-06-08 DIAGNOSIS — E538 Deficiency of other specified B group vitamins: Secondary | ICD-10-CM | POA: Diagnosis not present

## 2017-06-08 DIAGNOSIS — Z794 Long term (current) use of insulin: Secondary | ICD-10-CM | POA: Diagnosis not present

## 2017-06-08 DIAGNOSIS — E11649 Type 2 diabetes mellitus with hypoglycemia without coma: Secondary | ICD-10-CM | POA: Diagnosis not present

## 2017-06-08 DIAGNOSIS — I4891 Unspecified atrial fibrillation: Secondary | ICD-10-CM | POA: Diagnosis not present

## 2017-06-08 NOTE — Telephone Encounter (Signed)
Pt calling wanting to know if she is to take Potassium every day except on the weekends  Please advise

## 2017-06-11 DIAGNOSIS — S3210XD Unspecified fracture of sacrum, subsequent encounter for fracture with routine healing: Secondary | ICD-10-CM | POA: Diagnosis not present

## 2017-06-11 DIAGNOSIS — I110 Hypertensive heart disease with heart failure: Secondary | ICD-10-CM | POA: Diagnosis not present

## 2017-06-11 DIAGNOSIS — Z7901 Long term (current) use of anticoagulants: Secondary | ICD-10-CM | POA: Insufficient documentation

## 2017-06-11 DIAGNOSIS — I4891 Unspecified atrial fibrillation: Secondary | ICD-10-CM | POA: Diagnosis not present

## 2017-06-11 DIAGNOSIS — S2241XD Multiple fractures of ribs, right side, subsequent encounter for fracture with routine healing: Secondary | ICD-10-CM | POA: Diagnosis not present

## 2017-06-11 DIAGNOSIS — E1142 Type 2 diabetes mellitus with diabetic polyneuropathy: Secondary | ICD-10-CM | POA: Diagnosis not present

## 2017-06-11 DIAGNOSIS — I5032 Chronic diastolic (congestive) heart failure: Secondary | ICD-10-CM | POA: Diagnosis not present

## 2017-06-11 DIAGNOSIS — S3210XA Unspecified fracture of sacrum, initial encounter for closed fracture: Secondary | ICD-10-CM | POA: Diagnosis not present

## 2017-06-11 NOTE — Telephone Encounter (Signed)
Pt returning our call  She states she just needs to verify how she is to take the Potassium   Please call back

## 2017-06-11 NOTE — Telephone Encounter (Signed)
S/w patient. She is currently taking furosemide 20 mg by mouth EVERY OTHER DAY. This change occurred after a recent hospitalization. She only takes potassium 10 mEq about 2-3 times a week.  She would like to know how often she should take the potassium as she is unclear.  States they discussed this at last office visit and he said it was ok; however, it was not corrected on her AVS and wants to make sure she is doing this correct. Advised I will route to Dr Rockey Situ to clarify.

## 2017-06-12 DIAGNOSIS — G8929 Other chronic pain: Secondary | ICD-10-CM | POA: Diagnosis not present

## 2017-06-12 DIAGNOSIS — E039 Hypothyroidism, unspecified: Secondary | ICD-10-CM | POA: Diagnosis not present

## 2017-06-12 DIAGNOSIS — M8000XD Age-related osteoporosis with current pathological fracture, unspecified site, subsequent encounter for fracture with routine healing: Secondary | ICD-10-CM | POA: Diagnosis not present

## 2017-06-12 DIAGNOSIS — M533 Sacrococcygeal disorders, not elsewhere classified: Secondary | ICD-10-CM | POA: Diagnosis not present

## 2017-06-12 DIAGNOSIS — I110 Hypertensive heart disease with heart failure: Secondary | ICD-10-CM | POA: Diagnosis not present

## 2017-06-12 DIAGNOSIS — W19XXXD Unspecified fall, subsequent encounter: Secondary | ICD-10-CM | POA: Diagnosis not present

## 2017-06-12 DIAGNOSIS — I5032 Chronic diastolic (congestive) heart failure: Secondary | ICD-10-CM | POA: Diagnosis not present

## 2017-06-12 DIAGNOSIS — M25539 Pain in unspecified wrist: Secondary | ICD-10-CM | POA: Diagnosis not present

## 2017-06-12 DIAGNOSIS — C569 Malignant neoplasm of unspecified ovary: Secondary | ICD-10-CM | POA: Diagnosis not present

## 2017-06-12 DIAGNOSIS — R69 Illness, unspecified: Secondary | ICD-10-CM | POA: Diagnosis not present

## 2017-06-12 DIAGNOSIS — G3184 Mild cognitive impairment, so stated: Secondary | ICD-10-CM | POA: Diagnosis not present

## 2017-06-12 DIAGNOSIS — E119 Type 2 diabetes mellitus without complications: Secondary | ICD-10-CM | POA: Diagnosis not present

## 2017-06-12 DIAGNOSIS — Z7901 Long term (current) use of anticoagulants: Secondary | ICD-10-CM | POA: Diagnosis not present

## 2017-06-12 DIAGNOSIS — M810 Age-related osteoporosis without current pathological fracture: Secondary | ICD-10-CM | POA: Diagnosis not present

## 2017-06-12 DIAGNOSIS — I4891 Unspecified atrial fibrillation: Secondary | ICD-10-CM | POA: Diagnosis not present

## 2017-06-12 NOTE — Telephone Encounter (Signed)
Would take potassium 10 mEq Monday through Friday She typically runs low

## 2017-06-13 DIAGNOSIS — S2241XD Multiple fractures of ribs, right side, subsequent encounter for fracture with routine healing: Secondary | ICD-10-CM | POA: Diagnosis not present

## 2017-06-13 DIAGNOSIS — I5032 Chronic diastolic (congestive) heart failure: Secondary | ICD-10-CM | POA: Diagnosis not present

## 2017-06-13 DIAGNOSIS — E1142 Type 2 diabetes mellitus with diabetic polyneuropathy: Secondary | ICD-10-CM | POA: Diagnosis not present

## 2017-06-13 DIAGNOSIS — S3210XD Unspecified fracture of sacrum, subsequent encounter for fracture with routine healing: Secondary | ICD-10-CM | POA: Diagnosis not present

## 2017-06-13 DIAGNOSIS — I4891 Unspecified atrial fibrillation: Secondary | ICD-10-CM | POA: Diagnosis not present

## 2017-06-13 DIAGNOSIS — I110 Hypertensive heart disease with heart failure: Secondary | ICD-10-CM | POA: Diagnosis not present

## 2017-06-13 NOTE — Telephone Encounter (Signed)
Instructed patient to increase K to 10 meq M-F. She states she went to a new PCP yesterday. Blood was drawn and she was instructed to stop K.  Instructed the patient to call [facility] with an update when she gets her lab results from PCP and to confirm medications/K dosage. She agrees with treatment plan.

## 2017-06-15 DIAGNOSIS — I4891 Unspecified atrial fibrillation: Secondary | ICD-10-CM | POA: Diagnosis not present

## 2017-06-15 DIAGNOSIS — I10 Essential (primary) hypertension: Secondary | ICD-10-CM | POA: Diagnosis not present

## 2017-06-15 DIAGNOSIS — E039 Hypothyroidism, unspecified: Secondary | ICD-10-CM | POA: Diagnosis not present

## 2017-06-15 DIAGNOSIS — S0003XA Contusion of scalp, initial encounter: Secondary | ICD-10-CM | POA: Diagnosis not present

## 2017-06-15 DIAGNOSIS — Z880 Allergy status to penicillin: Secondary | ICD-10-CM | POA: Diagnosis not present

## 2017-06-15 DIAGNOSIS — R51 Headache: Secondary | ICD-10-CM | POA: Diagnosis not present

## 2017-06-15 DIAGNOSIS — Z8543 Personal history of malignant neoplasm of ovary: Secondary | ICD-10-CM | POA: Diagnosis not present

## 2017-06-15 DIAGNOSIS — E119 Type 2 diabetes mellitus without complications: Secondary | ICD-10-CM | POA: Diagnosis not present

## 2017-06-15 DIAGNOSIS — S3210XD Unspecified fracture of sacrum, subsequent encounter for fracture with routine healing: Secondary | ICD-10-CM | POA: Diagnosis not present

## 2017-06-15 DIAGNOSIS — Z79899 Other long term (current) drug therapy: Secondary | ICD-10-CM | POA: Diagnosis not present

## 2017-06-15 DIAGNOSIS — Z7901 Long term (current) use of anticoagulants: Secondary | ICD-10-CM | POA: Diagnosis not present

## 2017-06-15 DIAGNOSIS — E1142 Type 2 diabetes mellitus with diabetic polyneuropathy: Secondary | ICD-10-CM | POA: Diagnosis not present

## 2017-06-15 DIAGNOSIS — W2209XA Striking against other stationary object, initial encounter: Secondary | ICD-10-CM | POA: Diagnosis not present

## 2017-06-15 DIAGNOSIS — I110 Hypertensive heart disease with heart failure: Secondary | ICD-10-CM | POA: Diagnosis not present

## 2017-06-15 DIAGNOSIS — I5032 Chronic diastolic (congestive) heart failure: Secondary | ICD-10-CM | POA: Diagnosis not present

## 2017-06-15 DIAGNOSIS — S2241XD Multiple fractures of ribs, right side, subsequent encounter for fracture with routine healing: Secondary | ICD-10-CM | POA: Diagnosis not present

## 2017-06-18 DIAGNOSIS — I110 Hypertensive heart disease with heart failure: Secondary | ICD-10-CM | POA: Diagnosis not present

## 2017-06-18 DIAGNOSIS — E1142 Type 2 diabetes mellitus with diabetic polyneuropathy: Secondary | ICD-10-CM | POA: Diagnosis not present

## 2017-06-18 DIAGNOSIS — I4891 Unspecified atrial fibrillation: Secondary | ICD-10-CM | POA: Diagnosis not present

## 2017-06-18 DIAGNOSIS — S3210XD Unspecified fracture of sacrum, subsequent encounter for fracture with routine healing: Secondary | ICD-10-CM | POA: Diagnosis not present

## 2017-06-18 DIAGNOSIS — S2241XD Multiple fractures of ribs, right side, subsequent encounter for fracture with routine healing: Secondary | ICD-10-CM | POA: Diagnosis not present

## 2017-06-18 DIAGNOSIS — I5032 Chronic diastolic (congestive) heart failure: Secondary | ICD-10-CM | POA: Diagnosis not present

## 2017-06-20 DIAGNOSIS — I5032 Chronic diastolic (congestive) heart failure: Secondary | ICD-10-CM | POA: Diagnosis not present

## 2017-06-20 DIAGNOSIS — I4891 Unspecified atrial fibrillation: Secondary | ICD-10-CM | POA: Diagnosis not present

## 2017-06-20 DIAGNOSIS — E1142 Type 2 diabetes mellitus with diabetic polyneuropathy: Secondary | ICD-10-CM | POA: Diagnosis not present

## 2017-06-20 DIAGNOSIS — S3210XD Unspecified fracture of sacrum, subsequent encounter for fracture with routine healing: Secondary | ICD-10-CM | POA: Diagnosis not present

## 2017-06-20 DIAGNOSIS — I110 Hypertensive heart disease with heart failure: Secondary | ICD-10-CM | POA: Diagnosis not present

## 2017-06-20 DIAGNOSIS — S2241XD Multiple fractures of ribs, right side, subsequent encounter for fracture with routine healing: Secondary | ICD-10-CM | POA: Diagnosis not present

## 2017-06-21 DIAGNOSIS — S3210XD Unspecified fracture of sacrum, subsequent encounter for fracture with routine healing: Secondary | ICD-10-CM | POA: Diagnosis not present

## 2017-06-21 DIAGNOSIS — I110 Hypertensive heart disease with heart failure: Secondary | ICD-10-CM | POA: Diagnosis not present

## 2017-06-21 DIAGNOSIS — I4891 Unspecified atrial fibrillation: Secondary | ICD-10-CM | POA: Diagnosis not present

## 2017-06-21 DIAGNOSIS — S2241XD Multiple fractures of ribs, right side, subsequent encounter for fracture with routine healing: Secondary | ICD-10-CM | POA: Diagnosis not present

## 2017-06-21 DIAGNOSIS — E1142 Type 2 diabetes mellitus with diabetic polyneuropathy: Secondary | ICD-10-CM | POA: Diagnosis not present

## 2017-06-21 DIAGNOSIS — I5032 Chronic diastolic (congestive) heart failure: Secondary | ICD-10-CM | POA: Diagnosis not present

## 2017-06-28 DIAGNOSIS — Z794 Long term (current) use of insulin: Secondary | ICD-10-CM | POA: Diagnosis not present

## 2017-06-28 DIAGNOSIS — E1129 Type 2 diabetes mellitus with other diabetic kidney complication: Secondary | ICD-10-CM | POA: Diagnosis not present

## 2017-06-28 DIAGNOSIS — R809 Proteinuria, unspecified: Secondary | ICD-10-CM | POA: Diagnosis not present

## 2017-06-28 DIAGNOSIS — E538 Deficiency of other specified B group vitamins: Secondary | ICD-10-CM | POA: Diagnosis not present

## 2017-06-28 DIAGNOSIS — E039 Hypothyroidism, unspecified: Secondary | ICD-10-CM | POA: Diagnosis not present

## 2017-07-05 DIAGNOSIS — I5032 Chronic diastolic (congestive) heart failure: Secondary | ICD-10-CM | POA: Diagnosis not present

## 2017-07-05 DIAGNOSIS — E1142 Type 2 diabetes mellitus with diabetic polyneuropathy: Secondary | ICD-10-CM | POA: Diagnosis not present

## 2017-07-05 DIAGNOSIS — I4891 Unspecified atrial fibrillation: Secondary | ICD-10-CM | POA: Diagnosis not present

## 2017-07-05 DIAGNOSIS — S3210XD Unspecified fracture of sacrum, subsequent encounter for fracture with routine healing: Secondary | ICD-10-CM | POA: Diagnosis not present

## 2017-07-05 DIAGNOSIS — I110 Hypertensive heart disease with heart failure: Secondary | ICD-10-CM | POA: Diagnosis not present

## 2017-07-05 DIAGNOSIS — S2241XD Multiple fractures of ribs, right side, subsequent encounter for fracture with routine healing: Secondary | ICD-10-CM | POA: Diagnosis not present

## 2017-07-06 DIAGNOSIS — Z794 Long term (current) use of insulin: Secondary | ICD-10-CM | POA: Diagnosis not present

## 2017-07-06 DIAGNOSIS — E538 Deficiency of other specified B group vitamins: Secondary | ICD-10-CM | POA: Diagnosis not present

## 2017-07-06 DIAGNOSIS — R809 Proteinuria, unspecified: Secondary | ICD-10-CM | POA: Diagnosis not present

## 2017-07-06 DIAGNOSIS — E1129 Type 2 diabetes mellitus with other diabetic kidney complication: Secondary | ICD-10-CM | POA: Diagnosis not present

## 2017-07-06 DIAGNOSIS — E039 Hypothyroidism, unspecified: Secondary | ICD-10-CM | POA: Diagnosis not present

## 2017-07-06 DIAGNOSIS — E1142 Type 2 diabetes mellitus with diabetic polyneuropathy: Secondary | ICD-10-CM | POA: Diagnosis not present

## 2017-07-06 DIAGNOSIS — E11649 Type 2 diabetes mellitus with hypoglycemia without coma: Secondary | ICD-10-CM | POA: Diagnosis not present

## 2017-07-09 ENCOUNTER — Encounter: Payer: Self-pay | Admitting: Podiatry

## 2017-07-09 ENCOUNTER — Ambulatory Visit: Payer: Medicare HMO | Admitting: Podiatry

## 2017-07-09 DIAGNOSIS — M79676 Pain in unspecified toe(s): Secondary | ICD-10-CM | POA: Diagnosis not present

## 2017-07-09 DIAGNOSIS — E1149 Type 2 diabetes mellitus with other diabetic neurological complication: Secondary | ICD-10-CM | POA: Diagnosis not present

## 2017-07-09 DIAGNOSIS — E1140 Type 2 diabetes mellitus with diabetic neuropathy, unspecified: Secondary | ICD-10-CM | POA: Diagnosis not present

## 2017-07-09 DIAGNOSIS — B351 Tinea unguium: Secondary | ICD-10-CM

## 2017-07-09 NOTE — Progress Notes (Signed)
Complaint:  Visit Type: Patient returns to my office for continued preventative foot care services. Complaint: Patient states" my nails have grown long and thick and become painful to walk and wear shoes" Patient has been diagnosed with DM with neuropathy.. The patient presents for preventative foot care services. No changes to ROS  Podiatric Exam: Vascular: dorsalis pedis and posterior tibial pulses are palpable bilateral. Capillary return is immediate. Temperature gradient is WNL. Skin turgor WNL  Sensorium: Diminished  Semmes Weinstein monofilament test. Normal tactile sensation bilaterally. Nail Exam: Pt has thick disfigured discolored nails with subungual debris noted bilateral entire nail hallux through fifth toenails Ulcer Exam: There is no evidence of ulcer or pre-ulcerative changes or infection. Orthopedic Exam: Muscle tone and strength are WNL. No limitations in general ROM. No crepitus or effusions noted. Foot type and digits show no abnormalities. Bony prominences are unremarkable. Skin: No Porokeratosis. No infection or ulcers  Diagnosis:  Onychomycosis, , Pain in right toe, pain in left toes  Treatment & Plan Procedures and Treatment: Consent by patient was obtained for treatment procedures. The patient understood the discussion of treatment and procedures well. All questions were answered thoroughly reviewed. Debridement of mycotic and hypertrophic toenails, 1 through 5 bilateral and clearing of subungual debris. No ulceration, no infection noted.  Return Visit-Office Procedure: Patient instructed to return to the office for a follow up visit 3 months for continued evaluation and treatment.    Lysander Calixte DPM 

## 2017-07-10 DIAGNOSIS — R3915 Urgency of urination: Secondary | ICD-10-CM | POA: Diagnosis not present

## 2017-07-13 DIAGNOSIS — R69 Illness, unspecified: Secondary | ICD-10-CM | POA: Diagnosis not present

## 2017-07-19 DIAGNOSIS — M8000XD Age-related osteoporosis with current pathological fracture, unspecified site, subsequent encounter for fracture with routine healing: Secondary | ICD-10-CM | POA: Diagnosis not present

## 2017-07-19 DIAGNOSIS — K529 Noninfective gastroenteritis and colitis, unspecified: Secondary | ICD-10-CM | POA: Diagnosis not present

## 2017-07-19 DIAGNOSIS — Z7901 Long term (current) use of anticoagulants: Secondary | ICD-10-CM | POA: Diagnosis not present

## 2017-07-19 DIAGNOSIS — E1149 Type 2 diabetes mellitus with other diabetic neurological complication: Secondary | ICD-10-CM | POA: Diagnosis not present

## 2017-07-19 DIAGNOSIS — I10 Essential (primary) hypertension: Secondary | ICD-10-CM | POA: Diagnosis not present

## 2017-07-19 DIAGNOSIS — G3184 Mild cognitive impairment, so stated: Secondary | ICD-10-CM | POA: Diagnosis not present

## 2017-08-09 ENCOUNTER — Telehealth: Payer: Self-pay | Admitting: Cardiovascular Disease

## 2017-08-09 DIAGNOSIS — S3210XD Unspecified fracture of sacrum, subsequent encounter for fracture with routine healing: Secondary | ICD-10-CM | POA: Diagnosis not present

## 2017-08-09 DIAGNOSIS — S3210XA Unspecified fracture of sacrum, initial encounter for closed fracture: Secondary | ICD-10-CM | POA: Diagnosis not present

## 2017-08-09 NOTE — Telephone Encounter (Signed)
Left voicemail message to call back  

## 2017-08-09 NOTE — Telephone Encounter (Signed)
Pt states she is on Xarelto and his having some bleeding on a place on her finger, and today she has had some blood in her urine. Please call and advise if she should keep taking Xarelto. She would also like to discuss being on Coumadin
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
## 2017-08-19 NOTE — Progress Notes (Signed)
Cardiology Office Note  Date:  08/21/2017   ID:  Jodi Reeves, DOB [birth year], MRN [id number]  PCP:  Jodi Chick, MD   Chief Complaint  Patient presents with  . OTHER    Discuss anticoagulant options c/o edema. Meds reviewed verbally with pt.    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  CT chest 04/20/2016, Calcifications of the aortic arch, mild calcification of the coronary arteries. Moderate calcifications of the abdominal aorta, which is tortuous. Previous stretching of esophagus with Dr. Candace Cruise Chronic leg swelling, venous She presents for follow-up of her atrial fibrillation and chest pain  Numerous issues to discuss today Does not have primary care physician, initially was going to see geriatrician at [facility] but this is too far.  Was told that she did not see Dr. Hervey Ard locally for 1 year and could no longer see her Looking for new primary care physician  Reports doing somewhat better than on her last clinic visit after a fall Walking with support Son had to stay with her following the fall and during her recovery now she is by herself at night Has someone from home instead 4 hours daily Denies any recent falls  Chronic lower extremity edema, sometimes uses compression hose, stable    emergency room March 18, 2017 for chest pain Hospital records reviewed with the patient in detail Cardiac enzymes negative  Outpatient stress test low risk no ischemia  Lab work reviewed HBA1C 7.6 BS 150 to 160 Blood pressure 127 to 517G systolic Declined cholesterol medication in the past  Reports recent episode of very dark urine was not sure if this was bladder not Could have been dehydrated.  May have seen spots on her pad concerning for blood but again  not sure.  This went away and has not returned Still on Xarelto  EKG personally reviewed by myself on todays visit Shows atrial fibrillation with rate 87 bpm unable to exclude old anterior MI  Other past medical hx Previous  falls , Went to [facility], , Sacral fx Left face bruising, severe, Xarelto held Did not qualify for placement/SNF   Initially required 24 hour care at home walking with a walker,    PT 3x per week, OT,    RN 2 -3 x per week  CT scan chest March 18, 2017 with cardiomegaly and coronary artery calcification, aortic atherosclerosis  Chronic GI issues Previously had diarrhea, now with constipation. Working with GI  HBA1C 7.9  Went to the ER 04/19/2016,  complaints of Chest pain, difficulty swallowing  sounded more esophageal per the notes several days of difficulty with swallowing food and medicine.  Her troponins returned negative, her EKGs with no changes CT scan of the chest performed, limited echocardiogram performed  On day of discharge, she ate normal food and had no difficulty with dysphagia or pain. She was discharged home.  CT chest 04/20/2016: Calcifications of the aortic arch. mild calcification of the coronary arteries. Moderate calcifications of the abdominal aorta, which is tortuous. The origins of the celiac, SMA, single bilateral renal arteries are patent. The origin of the IMA is not seen, and may be occluded chronically.  Results of ER echo (limited) 04/19/2016 No sonographic evidence of significant cardiac dysfunction, No sonographic  evidence of significant pericardial effusion, Normal RV, No  sonographic  evidence of volume depletion and Pericardial effusion:with a SMALL  Effusion  TIA in January 2017, went to [facility] for evaluation She was talking with a friend, had word finding difficulty, friend was finishing her sentences Workup at [facility] reviewed She had CT scan of her head which was essentially normal,  carotid ultrasound that showed mild  bilateral disease, no significant stenoses,  echocardiogram with ejection fraction greater than 65%, mild valve abnormality  Previous total cholesterol 201, LDL 107, hemoglobin A1c 6.9 Declined a cholesterol pill in the past   Previous mechanical fall at church while she was hanging up her coat in 2015, She fell backwards, hit her head, was transferred to [facility] While in the emergency room she had chest pain, had head CT scan, MRI, cardiac enzymes which were normal 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    PMH:   has a past medical history of Anxiety, Balance disorder, Chronic diastolic CHF (congestive heart failure) ([place]), Coronary artery disease, non-occlusive, DM2 (diabetes mellitus, type 2) ([place]), GERD (gastroesophageal reflux disease), Granuloma annulare, HLD (hyperlipidemia), HTN (hypertension), Hypothyroidism, Incontinence of bowel, Osteoporosis, Ovarian cancer ([place]), Persistent atrial fibrillation ([place]), TIA (transient ischemic attack), and Venous insufficiency.  PSH:    Past Surgical History:  Procedure Laterality Date  . 2nd look laparotomy  1982  . ABDOMINAL HYSTERECTOMY    . CARDIAC CATHETERIZATION  8/05   neg; a fib found   . cataract OD  2002  . cataract surgery  5/07   R  . CHOLECYSTECTOMY  1986  . DEXA  9/04 and 1/02  . EGD/dilation/colon  1/06  . ESOPHAGOGASTRODUODENOSCOPY (EGD) WITH PROPOFOL N/A 05/16/2016   Procedure: ESOPHAGOGASTRODUODENOSCOPY (EGD) WITH PROPOFOL with dilation;  Surgeon: Lucilla Lame, MD;  Location: [facility] ENDOSCOPY;  Service: Endoscopy;  Laterality: N/A;  . FEMORAL HERNIA REPAIR  1958   R  . fracture L elbow/wrist  2000  . intussception/obstruction  12/98  . kidney stone x2  1991  . laminectomy L4-5  1971  . LUMBAR LAMINECTOMY    . MOUTH SURGERY    . myoview stress  8/05   syncope (-)  . REVERSE SHOULDER ARTHROPLASTY Left 10/06/2014    Procedure: REVERSE SHOULDER ARTHROPLASTY;  Surgeon: Corky Mull, MD;  Location: [facility] ORS;  Service: Orthopedics;  Laterality: Left;  . stillbirth  1969  . TOTAL ABDOMINAL HYSTERECTOMY W/ BILATERAL SALPINGOOPHORECTOMY  1980   ovarian cancer  . WRIST FRACTURE SURGERY  11/05   R    Current Outpatient Medications  Medication Sig Dispense Refill  . acetaminophen (TYLENOL) 500 MG tablet Take 1,000 mg by mouth every 6 (six) hours as needed for moderate pain or headache.    . Ascorbic Acid (VITAMIN C) POWD Take 1 Dose by mouth daily.     . bisoprolol-hydrochlorothiazide (ZIAC) 2.5-6.25 MG tablet TAKE ONE TABLET BY MOUTH EVERY DAY 90 tablet 3  . Calcium Citrate 200 MG TABS Take by mouth daily.    Marland Kitchen COENZYME Q10 PO Take 1 capsule by mouth daily.     Marland Kitchen docusate sodium (COLACE) 100 MG capsule Take 100 mg by mouth 2 (two) times daily.    . famotidine (PEPCID) 20 MG tablet Take 20 mg by mouth daily as needed for heartburn.     . Folic Acid-Vit G2-XBM W41 (FA-VITAMIN B-6-VITAMIN B-12) 2.2-25-0.5 MG TABS Place 1 tablet under the tongue daily.     Marland Kitchen  glucose blood (ONE TOUCH ULTRA TEST) test strip Use to test blood sugar once daily E11.49 100 each 1  . levothyroxine (SYNTHROID, LEVOTHROID) 50 MCG tablet Take 50 mcg by mouth daily before breakfast.    . metFORMIN (GLUCOPHAGE) 500 MG tablet Take by mouth 2 (two) times daily with a meal.    . Multiple Vitamin (CALCIUM COMPLEX PO) Take 1 tablet by mouth daily.    . NON FORMULARY Trivita Supplement B12 Takes qd.    . Omega-3 1000 MG CAPS Take 2,000 mg by mouth daily.     Vladimir Faster Glycol-Propyl Glycol (SYSTANE OP) Place 1-2 drops into both eyes daily as needed (for seasonal allergies).    . Vitamins A & D (VITAMIN A & D) ointment Apply 1 application topically as needed for dry skin or lip care.    Alveda Reasons 20 MG TABS tablet TAKE ONE (1) TABLET BY MOUTH EVERY DAY 90 tablet 3   No current facility-administered medications for this visit.      Allergies:    Penicillins; Actos [pioglitazone]; Amaryl [glimepiride]; Amiodarone; Atorvastatin; Ciprofloxacin; Codeine; Ezetimibe-simvastatin; Fosamax [alendronate]; Lovastatin; Rosiglitazone maleate; Statins; Alendronate sodium; and Sulfamethoxazole-trimethoprim   Social History:  The patient  reports that she has never smoked. She has never used smokeless tobacco. She reports that she does not drink alcohol or use drugs.   Family History:   Family history is unknown by patient.    Review of Systems: Review of Systems  Respiratory: Negative.   Cardiovascular: Positive for leg swelling.  Gastrointestinal: Negative.   Musculoskeletal: Negative.        Unsteady gait  Neurological: Positive for weakness.  Psychiatric/Behavioral: Negative.   All other systems reviewed and are negative.    PHYSICAL EXAM: VS:  BP 128/70 (BP Location: Left Arm, Patient Position: Sitting, Cuff Size: Normal)   Pulse 87   Ht 5\' 3"  (1.6 m)   Wt 157 lb 4 oz (71.3 kg)   BMI 27.86 kg/m  , BMI Body mass index is 27.86 kg/m. GEN: Well nourished, well developed, in no acute distress, obese , presenting with a cane HEENT: normal , large region of ecchymosis left face Neck: no JVD, carotid bruits, or masses Cardiac: Irregularly irregular, no murmurs, rubs, or gallops, nonpitting lower extremity edema bilaterally  Respiratory:  clear to auscultation bilaterally, normal work of breathing GI: soft, nontender, nondistended, + BS MS: no deformity or atrophy home Skin: warm and dry, no rash Neuro:  Strength and sensation are intact Psych: euthymic mood, full affect    Recent Labs: 03/18/2017: BUN 15; Creatinine, Ser 0.63; Hemoglobin 13.6; Platelets 158; Potassium 3.6; Sodium 141    Lipid Panel Lab Results  Component Value Date   CHOL 209 (H) 02/24/2014   HDL 51.60 02/24/2014   LDLCALC 124 (H) 02/24/2014   TRIG 168.0 (H) 02/24/2014      Wt Readings from Last 3 Encounters:  08/21/17 157 lb 4 oz (71.3 kg)  05/30/17  154 lb (69.9 kg)  05/10/17 164 lb (74.4 kg)      ASSESSMENT AND PLAN:   Mixed hyperlipidemia  CT scan including mild coronary calcifications, aortic arch calcifications, moderate aortic atherosclerosis in the abdominal aorta  does not want a statin stable  No further work-up  Essential hypertension, benign - Plan: EKG 12-Lead Blood pressure is well controlled on today's visit. No changes made to the medications.  Persistent atrial fibrillation (HCC) Heart rate well controlled,  Tolerating Xarelto, no recent falls No changes made  Dark urine  She is concerned about hematuria but no proof of this Recommended if she has any other days with very dark urine that she contact primary care, may need urinalysis  if she does have confirmation of hematuria would likely benefit from urology but currently reports urine is back to normal color  Chronic diastolic CHF (congestive heart failure) (HCC) Takes Lasix daily, occasionally with extra dose potassium daily Appears euvolemic   Total encounter time more than 45 minutes  Greater than 50% was spent in counseling and coordination of care with the patient  Disposition:   F/U  12 months   Orders Placed This Encounter  Procedures  . EKG 12-Lead     Signed, Esmond Plants, M.D., Ph.D. 08/21/2017  [place], [place]

## 2017-08-21 ENCOUNTER — Encounter: Payer: Self-pay | Admitting: Cardiovascular Disease

## 2017-08-21 ENCOUNTER — Ambulatory Visit: Payer: Medicare HMO | Admitting: Cardiovascular Disease

## 2017-08-21 VITALS — BP 128/70 | HR 87 | Ht 63.0 in | Wt 157.2 lb

## 2017-08-21 DIAGNOSIS — I4819 Other persistent atrial fibrillation: Secondary | ICD-10-CM

## 2017-08-21 DIAGNOSIS — I700 Atherosclerosis of aorta: Secondary | ICD-10-CM | POA: Diagnosis not present

## 2017-08-21 DIAGNOSIS — I481 Persistent atrial fibrillation: Secondary | ICD-10-CM | POA: Diagnosis not present

## 2017-08-21 DIAGNOSIS — I2510 Atherosclerotic heart disease of native coronary artery without angina pectoris: Secondary | ICD-10-CM

## 2017-08-21 DIAGNOSIS — E782 Mixed hyperlipidemia: Secondary | ICD-10-CM

## 2017-08-21 DIAGNOSIS — I5032 Chronic diastolic (congestive) heart failure: Secondary | ICD-10-CM

## 2017-08-21 DIAGNOSIS — I10 Essential (primary) hypertension: Secondary | ICD-10-CM | POA: Diagnosis not present

## 2017-08-21 DIAGNOSIS — G459 Transient cerebral ischemic attack, unspecified: Secondary | ICD-10-CM

## 2017-08-21 NOTE — Patient Instructions (Addendum)
We will help make an appt with [facility] Scheduled to see Dr. [name] [phone] [place] , [place] [address]  June 14th at 1:30 PM [employer] cards, photo ID, and paperwork will be sent to you.   Medication Instructions:   No medication changes  Labwork:  No new labs needed  Testing/Procedures:  No further testing at this time   Follow-Up: It was a pleasure seeing you in the office today. Please call us if you have new issues that need to be addressed before your next appt.  [phone]  Your physician wants you to follow-up in: 12 months.  You will receive a reminder letter in the mail two months in advance. If you don't receive a letter, please call our office to schedule the follow-up appointment.  If you need a refill on your cardiac medications before your next appointment, please call your pharmacy.  For educational health videos Log in to : [email or web address] Or : [email or web address], password : [email or web address]

## 2017-08-29 DIAGNOSIS — R69 Illness, unspecified: Secondary | ICD-10-CM | POA: Diagnosis not present

## 2017-09-18 DIAGNOSIS — N390 Urinary tract infection, site not specified: Secondary | ICD-10-CM | POA: Diagnosis not present

## 2017-10-05 ENCOUNTER — Encounter: Payer: Self-pay | Admitting: Internal Medicine

## 2017-10-05 ENCOUNTER — Ambulatory Visit: Payer: Medicare HMO | Admitting: Internal Medicine

## 2017-10-05 VITALS — BP 136/92 | HR 77 | Temp 98.3°F | Ht 63.0 in | Wt 159.6 lb

## 2017-10-05 DIAGNOSIS — I481 Persistent atrial fibrillation: Secondary | ICD-10-CM

## 2017-10-05 DIAGNOSIS — M810 Age-related osteoporosis without current pathological fracture: Secondary | ICD-10-CM

## 2017-10-05 DIAGNOSIS — E559 Vitamin D deficiency, unspecified: Secondary | ICD-10-CM | POA: Diagnosis not present

## 2017-10-05 DIAGNOSIS — E119 Type 2 diabetes mellitus without complications: Secondary | ICD-10-CM | POA: Diagnosis not present

## 2017-10-05 DIAGNOSIS — I5032 Chronic diastolic (congestive) heart failure: Secondary | ICD-10-CM | POA: Diagnosis not present

## 2017-10-05 DIAGNOSIS — R319 Hematuria, unspecified: Secondary | ICD-10-CM | POA: Diagnosis not present

## 2017-10-05 DIAGNOSIS — Z87440 Personal history of urinary (tract) infections: Secondary | ICD-10-CM

## 2017-10-05 DIAGNOSIS — I4819 Other persistent atrial fibrillation: Secondary | ICD-10-CM

## 2017-10-05 DIAGNOSIS — I2510 Atherosclerotic heart disease of native coronary artery without angina pectoris: Secondary | ICD-10-CM | POA: Diagnosis not present

## 2017-10-05 DIAGNOSIS — N3001 Acute cystitis with hematuria: Secondary | ICD-10-CM | POA: Diagnosis not present

## 2017-10-05 DIAGNOSIS — E1149 Type 2 diabetes mellitus with other diabetic neurological complication: Secondary | ICD-10-CM

## 2017-10-05 DIAGNOSIS — K5909 Other constipation: Secondary | ICD-10-CM

## 2017-10-05 DIAGNOSIS — I10 Essential (primary) hypertension: Secondary | ICD-10-CM | POA: Diagnosis not present

## 2017-10-05 DIAGNOSIS — C569 Malignant neoplasm of unspecified ovary: Secondary | ICD-10-CM

## 2017-10-05 DIAGNOSIS — E032 Hypothyroidism due to medicaments and other exogenous substances: Secondary | ICD-10-CM

## 2017-10-05 LAB — COMPREHENSIVE METABOLIC PANEL
ALT: 9 U/L (ref 0–35)
AST: 21 U/L (ref 0–37)
Albumin: 4.3 g/dL (ref 3.5–5.2)
Alkaline Phosphatase: 66 U/L (ref 39–117)
BUN: 13 mg/dL (ref 6–23)
CALCIUM: 9.5 mg/dL (ref 8.4–10.5)
CHLORIDE: 101 meq/L (ref 96–112)
CO2: 30 meq/L (ref 19–32)
Creatinine, Ser: 0.57 mg/dL (ref 0.40–1.20)
GFR: 106.34 mL/min (ref >60.00)
GLUCOSE: 124 mg/dL — AB (ref 70–99)
Potassium: 4 mEq/L (ref 3.5–5.1)
Sodium: 139 mEq/L (ref 135–145)
Total Bilirubin: 0.6 mg/dL (ref 0.2–1.2)
Total Protein: 7.8 g/dL (ref 6.0–8.3)

## 2017-10-05 LAB — CBC WITH DIFFERENTIAL/PLATELET
BASOS PCT: 0.6 % (ref 0.0–3.0)
Basophils Absolute: 0 10*3/uL (ref 0.0–0.1)
EOS PCT: 0.8 % (ref 0.0–5.0)
Eosinophils Absolute: 0.1 10*3/uL (ref 0.0–0.7)
HEMATOCRIT: 40.2 % (ref 36.0–46.0)
Hemoglobin: 13.8 g/dL (ref 12.0–15.0)
LYMPHS ABS: 1.9 10*3/uL (ref 0.7–4.0)
LYMPHS PCT: 31 % (ref 12.0–46.0)
MCHC: 34.3 g/dL (ref 30.0–36.0)
MCV: 99.4 fl (ref 78.0–100.0)
MONOS PCT: 6.4 % (ref 3.0–12.0)
Monocytes Absolute: 0.4 10*3/uL (ref 0.1–1.0)
NEUTROS ABS: 3.8 10*3/uL (ref 1.4–7.7)
Neutrophils Relative %: 61.2 % (ref 43.0–77.0)
PLATELETS: 179 10*3/uL (ref 150.0–400.0)
RBC: 4.04 Mil/uL (ref 3.87–5.11)
RDW: 13.2 % (ref 11.5–15.5)
WBC: 6.2 10*3/uL (ref 4.0–10.5)

## 2017-10-05 LAB — T4, FREE: Free T4: 1.06 ng/dL (ref 0.60–1.60)

## 2017-10-05 LAB — HEMOGLOBIN A1C: Hgb A1c MFr Bld: 7.5 % — ABNORMAL HIGH (ref 4.6–6.5)

## 2017-10-05 LAB — VITAMIN D 25 HYDROXY (VIT D DEFICIENCY, FRACTURES): VITD: 57.41 ng/mL (ref 30.00–100.00)

## 2017-10-05 LAB — TSH: TSH: 1.45 u[IU]/mL (ref 0.35–4.50)

## 2017-10-05 NOTE — Patient Instructions (Addendum)
Follow up in 4-6 weeks   Urinary Tract Infection, Adult A urinary tract infection (UTI) is an infection of any part of the urinary tract. The urinary tract includes the:  Kidneys.  Ureters.  Bladder.  Urethra.  These organs make, store, and get rid of pee (urine) in the body. Follow these instructions at home:  Take over-the-counter and prescription medicines only as told by your doctor.  If you were prescribed an antibiotic medicine, take it as told by your doctor. Do not stop taking the antibiotic even if you start to feel better.  Avoid the following drinks: ? Alcohol. ? Caffeine. ? Tea. ? Carbonated drinks.  Drink enough fluid to keep your pee clear or pale yellow.  Keep all follow-up visits as told by your doctor. This is important.  Make sure to: ? Empty your bladder often and completely. Do not to hold pee for long periods of time. ? Empty your bladder before and after sex. ? Wipe from front to back after a bowel movement if you are female. Use each tissue one time when you wipe. Contact a doctor if:  You have back pain.  You have a fever.  You feel sick to your stomach (nauseous).  You throw up (vomit).  Your symptoms do not get better after 3 days.  Your symptoms go away and then come back. Get help right away if:  You have very bad back pain.  You have very bad lower belly (abdominal) pain.  You are throwing up and cannot keep down any medicines or water. This information is not intended to replace advice given to you by your health care provider. Make sure you discuss any questions you have with your health care provider. Document Released: 09/27/2007 Document Revised: 09/16/2015 Document Reviewed: 03/01/2015 Elsevier Interactive Patient Education  2018 [employer].  Diarrhea, Adult Diarrhea is frequent loose and watery bowel movements. Diarrhea can make you feel weak and cause you to become dehydrated. Dehydration can make you tired and  thirsty, cause you to have a dry mouth, and decrease how often you urinate. Diarrhea typically lasts 2-3 days. However, it can last longer if it is a sign of something more serious. It is important to treat your diarrhea as told by your health care provider. Follow these instructions at home: Eating and drinking  Follow these recommendations as told by your health care provider:  Take an oral rehydration solution (ORS). This is a drink that is sold at pharmacies and retail stores.  Drink clear fluids, such as water, ice chips, diluted fruit juice, and low-calorie sports drinks.  Eat bland, easy-to-digest foods in small amounts as you are able. These foods include bananas, applesauce, rice, lean meats, toast, and crackers.  Avoid drinking fluids that contain a lot of sugar or caffeine, such as energy drinks, sports drinks, and soda.  Avoid alcohol.  Avoid spicy or fatty foods.  General instructions  Drink enough fluid to keep your urine clear or pale yellow.  Wash your hands often. If soap and water are not available, use hand sanitizer.  Make sure that all people in your household wash their hands well and often.  Take over-the-counter and prescription medicines only as told by your health care provider.  Rest at home while you recover.  Watch your condition for any changes.  Take a warm bath to relieve any burning or pain from frequent diarrhea episodes.  Keep all follow-up visits as told by your health care provider. This is important.  Contact a health care provider if:  You have a fever.  Your diarrhea gets worse.  You have new symptoms.  You cannot keep fluids down.  You feel light-headed or dizzy.  You have a headache  You have muscle cramps. Get help right away if:  You have chest pain.  You feel extremely weak or you faint.  You have bloody or black stools or stools that look like tar.  You have severe pain, cramping, or bloating in your  abdomen.  You have trouble breathing or you are breathing very quickly.  Your heart is beating very quickly.  Your skin feels cold and clammy.  You feel confused.  You have signs of dehydration, such as: ? Dark urine, very little urine, or no urine. ? Cracked lips. ? Dry mouth. ? Sunken eyes. ? Sleepiness. ? Weakness. This information is not intended to replace advice given to you by your health care provider. Make sure you discuss any questions you have with your health care provider. Document Released: 03/31/2002 Document Revised: 08/19/2015 Document Reviewed: 12/15/2014 Elsevier Interactive Patient Education  2018 [employer].  Constipation, Adult Constipation is when a person has fewer bowel movements in a week than normal, has difficulty having a bowel movement, or has stools that are dry, hard, or larger than normal. Constipation may be caused by an underlying condition. It may become worse with age if a person takes certain medicines and does not take in enough fluids. Follow these instructions at home: Eating and drinking   Eat foods that have a lot of fiber, such as fresh fruits and vegetables, whole grains, and beans.  Limit foods that are high in fat, low in fiber, or overly processed, such as french fries, hamburgers, cookies, candies, and soda.  Drink enough fluid to keep your urine clear or pale yellow. General instructions  Exercise regularly or as told by your health care provider.  Go to the restroom when you have the urge to go. Do not hold it in.  Take over-the-counter and prescription medicines only as told by your health care provider. These include any fiber supplements.  Practice pelvic floor retraining exercises, such as deep breathing while relaxing the lower abdomen and pelvic floor relaxation during bowel movements.  Watch your condition for any changes.  Keep all follow-up visits as told by your health care provider. This is  important. Contact a health care provider if:  You have pain that gets worse.  You have a fever.  You do not have a bowel movement after 4 days.  You vomit.  You are not hungry.  You lose weight.  You are bleeding from the anus.  You have thin, pencil-like stools. Get help right away if:  You have a fever and your symptoms suddenly get worse.  You leak stool or have blood in your stool.  Your abdomen is bloated.  You have severe pain in your abdomen.  You feel dizzy or you faint. This information is not intended to replace advice given to you by your health care provider. Make sure you discuss any questions you have with your health care provider. Document Released: 01/07/2004 Document Revised: 10/29/2015 Document Reviewed: 09/29/2015 Elsevier Interactive Patient Education  2018 [employer].

## 2017-10-05 NOTE — Progress Notes (Signed)
Pre visit review using our clinic review tool, if applicable. No additional management support is needed unless otherwise documented below in the visit note. 

## 2017-10-07 LAB — URINE CULTURE
MICRO NUMBER:: 90716114
SPECIMEN QUALITY: ADEQUATE

## 2017-10-07 LAB — URINALYSIS, ROUTINE W REFLEX MICROSCOPIC
BILIRUBIN URINE: NEGATIVE
Glucose, UA: NEGATIVE
HGB URINE DIPSTICK: NEGATIVE
KETONES UR: NEGATIVE
Leukocytes, UA: NEGATIVE
Nitrite: NEGATIVE
Protein, ur: NEGATIVE
SPECIFIC GRAVITY, URINE: 1.012 (ref 1.001–1.03)
pH: 7 (ref 5.0–8.0)

## 2017-10-08 NOTE — Progress Notes (Signed)
Immunizations have been placed in chart.

## 2017-10-11 ENCOUNTER — Ambulatory Visit: Payer: Medicare HMO | Admitting: Podiatry

## 2017-10-11 ENCOUNTER — Encounter: Payer: Self-pay | Admitting: Podiatry

## 2017-10-11 DIAGNOSIS — B351 Tinea unguium: Secondary | ICD-10-CM

## 2017-10-11 DIAGNOSIS — E1149 Type 2 diabetes mellitus with other diabetic neurological complication: Secondary | ICD-10-CM

## 2017-10-11 DIAGNOSIS — E1140 Type 2 diabetes mellitus with diabetic neuropathy, unspecified: Secondary | ICD-10-CM | POA: Diagnosis not present

## 2017-10-11 DIAGNOSIS — M79676 Pain in unspecified toe(s): Secondary | ICD-10-CM | POA: Diagnosis not present

## 2017-10-11 NOTE — Progress Notes (Signed)
Complaint:  Visit Type: Patient returns to my office for continued preventative foot care services. Complaint: Patient states" my nails have grown long and thick and become painful to walk and wear shoes" Patient has been diagnosed with DM with neuropathy.. The patient presents for preventative foot care services. No changes to ROS.    Podiatric Exam: Vascular: dorsalis pedis and posterior tibial pulses are palpable bilateral. Capillary return is immediate. Temperature gradient is WNL. Skin turgor WNL  Sensorium: Diminished  Semmes Weinstein monofilament test. Normal tactile sensation bilaterally. Nail Exam: Pt has thick disfigured discolored nails with subungual debris noted bilateral entire nail hallux through fifth toenails Ulcer Exam: There is no evidence of ulcer or pre-ulcerative changes or infection. Orthopedic Exam: Muscle tone and strength are WNL. No limitations in general ROM. No crepitus or effusions noted. Foot type and digits show no abnormalities. Bony prominences are unremarkable.  Skin: No Porokeratosis. No infection or ulcers  Diagnosis:  Onychomycosis, , Pain in right toe, pain in left toes  Treatment & Plan Procedures and Treatment: Consent by patient was obtained for treatment procedures. The patient understood the discussion of treatment and procedures well. All questions were answered thoroughly reviewed. Debridement of mycotic and hypertrophic toenails, 1 through 5 bilateral and clearing of subungual debris. No ulceration, no infection noted.  Patient says the callus on the tip of second toe left foot was pulled off using her compression soaks. Return Visit-Office Procedure: Patient instructed to return to the office for a follow up visit 3 months for continued evaluation and treatment.    Gardiner Barefoot DPM

## 2017-10-12 ENCOUNTER — Encounter: Payer: Self-pay | Admitting: Internal Medicine

## 2017-10-12 DIAGNOSIS — E785 Hyperlipidemia, unspecified: Secondary | ICD-10-CM | POA: Diagnosis not present

## 2017-10-12 DIAGNOSIS — E039 Hypothyroidism, unspecified: Secondary | ICD-10-CM | POA: Diagnosis not present

## 2017-10-12 DIAGNOSIS — E1142 Type 2 diabetes mellitus with diabetic polyneuropathy: Secondary | ICD-10-CM | POA: Diagnosis not present

## 2017-10-12 DIAGNOSIS — E1169 Type 2 diabetes mellitus with other specified complication: Secondary | ICD-10-CM | POA: Diagnosis not present

## 2017-10-12 DIAGNOSIS — E1159 Type 2 diabetes mellitus with other circulatory complications: Secondary | ICD-10-CM | POA: Diagnosis not present

## 2017-10-12 DIAGNOSIS — I10 Essential (primary) hypertension: Secondary | ICD-10-CM | POA: Diagnosis not present

## 2017-10-12 NOTE — Progress Notes (Signed)
All immunizations have been placed in chart that was in [place]

## 2017-10-12 NOTE — Progress Notes (Addendum)
Chief Complaint  Patient presents with  . Establish Care   New patient with chronic medical problems  1. DM 2 last A1C 06/28/17 7.6 est. With thyroid MD soon. Prev could not tolerate several medications for DM 2. Hypothyroidism on levo 50 mcg qd she also has h/o thyroid nodules  3. H/o osteoporosis/penia declines medications due to side effects she had fall 04/2017 went to [facility] had rib fractures, wrist fractures and thinks she has left CTS from fall. She reports she now has dysphagia to foods s/p fosamax used and had to have esophagus dilated 4. Chronic constipation on colace bid h/o colon polyps in 2016. She also reports she has diarrhea x 2-3 months 5. Afib/CAD f/u with cardiology Dr. Rockey Situ. She is on Xarelto  6. She has h/o ovarian cancer s/p treatment 1976  7. Leg edema b/l L>r DVT neg 04/26/17 negative [facility]  8. Fall 04/2017 hosp and reduced strength s/p fall completed PT/OT  9. C/o end 07/2017 urine was black brown orange in color and she went to urgent care. She also c/o increased freq. She had E coli UTI was given macrobid prev noted in allergies but tolerated it this time x 5 days bid. She is considering urology consult in future if continues to have sx's.   Review of Systems  Constitutional: Negative for weight loss.  HENT: Negative for hearing loss.   Eyes: Negative for blurred vision.  Respiratory: Negative for shortness of breath.   Cardiovascular: Negative for chest pain.  Gastrointestinal: Negative for abdominal pain.  Musculoskeletal: Positive for falls.  Skin: Negative for rash.  Neurological: Negative for headaches.  Psychiatric/Behavioral: Negative for depression.   Past Medical History:  Diagnosis Date  . Allergy   . Anxiety   . Balance disorder   . Chronic diastolic CHF (congestive heart failure) ([place])   . Colon polyps   . Coronary artery disease, non-occlusive    a. LHC 11/2003: 10% LAD stenosis, 20% LCx stenosis, and 40% RCA stenosis; b. nuc stress test 12/15: small  region of mild ischemia in the apical region with WMA also noted in the apical region, EF 60%. She declined invasive evaluation at that time  . DM2 (diabetes mellitus, type 2) ([place])   . Fall    04/2017 [facility] multiple fractures, brusing   . GERD (gastroesophageal reflux disease)    esophageal web  . Granuloma annulare    [name].   . Granuloma annulare    since 2010  . History of chicken pox   . History of eating disorder   . HLD (hyperlipidemia)   . HTN (hypertension)   . Hypothyroidism   . IBS (irritable bowel syndrome)    diarrhea   . Incontinence of bowel   . Osteoporosis   . Ovarian cancer ([place])   . Persistent atrial fibrillation ([place])    a. CHADS2VASc = > 8 (CHF, HTN, age x 2, DM, TIA x 2, female); b. on Xarelto  . SBO (small bowel obstruction) ([place])    1998  . TIA (transient ischemic attack)   . TIA (transient ischemic attack)   . UTI (urinary tract infection)   . Venous insufficiency    Past Surgical History:  Procedure Laterality Date  . 2nd look laparotomy  1982  . ABDOMINAL HYSTERECTOMY     for ovarian cancer s/p hysterectomy total 1976 and exp lap 1980  . APPENDECTOMY    . CARDIAC CATHETERIZATION  8/05   neg; a fib found   .  cataract OD  2002  . cataract surgery  5/07   R  . CHOLECYSTECTOMY  1986  . DEXA  9/04 and 1/02  . EGD/dilation/colon  1/06  . ESOPHAGOGASTRODUODENOSCOPY (EGD) WITH PROPOFOL N/A 05/16/2016   Procedure: ESOPHAGOGASTRODUODENOSCOPY (EGD) WITH PROPOFOL with dilation;  Surgeon: Lucilla Lame, MD;  Location: [facility] ENDOSCOPY;  Service: Endoscopy;  Laterality: N/A;  . FEMORAL HERNIA REPAIR  1958   R  . fracture L elbow/wrist  2000  . intussception/obstruction  12/98  . JOINT REPLACEMENT     left shoulder  . kidney stone x2  1991  . laminectomy L4-5  1971  . LUMBAR LAMINECTOMY     1970 ruptured disc   . MOUTH SURGERY    . myoview stress  8/05   syncope (-)  . REVERSE SHOULDER ARTHROPLASTY Left 10/06/2014   Procedure:  REVERSE SHOULDER ARTHROPLASTY;  Surgeon: Corky Mull, MD;  Location: [facility] ORS;  Service: Orthopedics;  Laterality: Left;  . stillbirth  1969  . TOTAL ABDOMINAL HYSTERECTOMY W/ BILATERAL SALPINGOOPHORECTOMY  1980   ovarian cancer  . WRIST FRACTURE SURGERY  11/05   R   Family History  Problem Relation Age of Onset  . Early death Father   . Diabetes Mother   . Heart disease Mother   . Arthritis Brother   . Cancer Brother   . Depression Brother   . Hearing loss Brother   . Arthritis Brother   . Cancer Brother        colon  . Hearing loss Brother   . Cancer Brother   . Diabetes Brother   . Hearing loss Brother   . Heart disease Brother   . Hyperlipidemia Brother   . Heart disease Sister   . Hypertension Sister   . Stroke Sister   . Hearing loss Daughter   . Hypertension Daughter   . Diabetes Paternal Grandfather   . Hearing loss Sister   . Heart disease Sister   . Arthritis Sister   . Depression Sister   . Hearing loss Sister   . Heart disease Sister   . COPD Brother   . Early death Brother   . Early death Brother    Social History   Socioeconomic History  . Marital status: Widowed    Spouse name: Not on file  . Number of children: 1  . Years of education: Not on file  . Highest education level: Not on file  Occupational History  . 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 Financial resource strain: Not on file  . Food insecurity:    Worry: Not on file    Inability: Not on file  . Transportation needs:    Medical: Not on file    Non-medical: Not on file  Tobacco Use  . Smoking status: Never Smoker  . Smokeless tobacco: Never Used  Substance and Sexual Activity  . Alcohol use: No  . Drug use: No  . Sexual activity: Not on file  Lifestyle  . Physical activity:    Days per week: Not on file    Minutes per session: Not on file  . Stress: Not on file  Relationships  . Social connections:    Talks on phone: Not on file    Gets together: Not on  file    Attends religious service: Not on file    Active member of club or organization: Not on file    Attends meetings of clubs or organizations: Not on file  Relationship status: Not on file  . Intimate partner violence:    Fear of current or ex partner: Not on file    Emotionally abused: Not on file    Physically abused: Not on file    Forced sexual activity: Not on file  Other Topics Concern  . 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 acetaminophen (TYLENOL) 500 MG tablet Take 1,000 mg by mouth every 6 (six) hours as needed for moderate pain or headache.  . Ascorbic Acid (VITAMIN C) POWD Take 200 mg by mouth daily.   . bisoprolol-hydrochlorothiazide (ZIAC) 2.5-6.25 MG tablet TAKE ONE TABLET BY MOUTH EVERY DAY  . Calcium Citrate 200 MG TABS Take by mouth daily.  Marland Kitchen co-enzyme Q-10 30 MG capsule Take 30 mg by mouth 3 (three) times daily.  Marland Kitchen COENZYME Q10 PO Take 1 capsule by mouth daily.   Mariane Baumgarten Sodium (COLACE PO) Take by mouth.  . docusate sodium (COLACE) 100 MG capsule Take 100 mg by mouth 2 (two) times daily.  . Folic Acid-Vit M2-UQJ F35 (FA-VITAMIN B-6-VITAMIN B-12) 2.2-25-0.5 MG TABS Place 1 tablet under the tongue daily.   Marland Kitchen glucose blood (ONE TOUCH ULTRA TEST) test strip Use to test blood sugar once daily E11.49  . levothyroxine (SYNTHROID, LEVOTHROID) 50 MCG tablet Take 50 mcg by mouth daily before breakfast.  . metFORMIN (GLUMETZA) 500 MG (MOD) 24 hr tablet Take 500 mg by mouth 2 (two) times daily with a meal.  . Multiple Vitamin (CALCIUM COMPLEX PO) Take 1 tablet by mouth daily.  . NON FORMULARY Trivita Supplement B12 Takes qd. B6 qd, and folic acid  . Omega-3 1000 MG CAPS Take 2,000 mg by  mouth daily.   Alveda Reasons 20 MG TABS tablet TAKE ONE (1) TABLET BY MOUTH EVERY DAY  . [DISCONTINUED] famotidine (PEPCID) 20 MG tablet Take 20 mg by mouth daily as needed for heartburn.   . [DISCONTINUED] metFORMIN (GLUCOPHAGE) 500 MG tablet Take by mouth 2 (two) times daily with a meal.  . [DISCONTINUED] Polyethyl Glycol-Propyl Glycol (SYSTANE OP) Place 1-2 drops into both eyes daily as needed (for seasonal allergies).  . [DISCONTINUED] Vitamins A & D (VITAMIN A & D) ointment Apply 1 application topically as needed for dry skin or lip care.   Allergies  Allergen Reactions  . Penicillins Anaphylaxis and Other (See Comments)    Has patient had a PCN reaction causing immediate rash, facial/tongue/throat swelling, SOB or lightheadedness with hypotension: Yes Has patient had a PCN reaction causing severe rash involving mucus membranes or skin necrosis: No Has patient had a PCN reaction that required hospitalization: No Has patient had a PCN reaction occurring within the last 10 years: No If all of the above answers are "NO", then may proceed with Cephalosporin use.   . Actos [Pioglitazone]     Not effective   . Amaryl [Glimepiride]     Not effective    . Amiodarone     Thyroid issues   . Atorvastatin Other (See Comments)    Muscle aches  . Ciprofloxacin Other (See Comments)    High blood pressure, aches chest pressure    .  Codeine Nausea And Vomiting  . Ezetimibe-Simvastatin Other (See Comments)    Muscle aches, nausea, back pain   . Fosamax [Alendronate]   . Lovastatin Other (See Comments)    Myalgias  . Macrobid [Nitrofurantoin Macrocrystal]     Itching    . Protonix [Pantoprazole Sodium]     Esophageal problems    . Rosiglitazone Maleate Other (See Comments)    Edema  . Statins     Muscle aches to all   . Sulfa Antibiotics     Itching   . Victoza [Liraglutide]     Nausea   . Alendronate Sodium Rash    Dysphagia and ulceration   . Bactrim  [Sulfamethoxazole-Trimethoprim] Rash    itching   Recent Results (from the past 2160 hour(s))  Comprehensive metabolic panel     Status: Abnormal   Collection Time: 10/05/17  2:36 PM  Result Value Ref Range   Sodium 139 135 - 145 mEq/L   Potassium 4.0 3.5 - 5.1 mEq/L   Chloride 101 96 - 112 mEq/L   CO2 30 19 - 32 mEq/L   Glucose, Bld 124 (H) 70 - 99 mg/dL   BUN 13 6 - 23 mg/dL   Creatinine, Ser 0.57 0.40 - 1.20 mg/dL   Total Bilirubin 0.6 0.2 - 1.2 mg/dL   Alkaline Phosphatase 66 39 - 117 U/L   AST 21 0 - 37 U/L   ALT 9 0 - 35 U/L   Total Protein 7.8 6.0 - 8.3 g/dL   Albumin 4.3 3.5 - 5.2 g/dL   Calcium 9.5 8.4 - 10.5 mg/dL   GFR 106.34 >60.00 mL/min  CBC with Differential/Platelet     Status: None   Collection Time: 10/05/17  2:36 PM  Result Value Ref Range   WBC 6.2 4.0 - 10.5 K/uL   RBC 4.04 3.87 - 5.11 Mil/uL   Hemoglobin 13.8 12.0 - 15.0 g/dL   HCT 40.2 36.0 - 46.0 %   MCV 99.4 78.0 - 100.0 fl   MCHC 34.3 30.0 - 36.0 g/dL   RDW 13.2 11.5 - 15.5 %   Platelets 179.0 150.0 - 400.0 K/uL   Neutrophils Relative % 61.2 43.0 - 77.0 %   Lymphocytes Relative 31.0 12.0 - 46.0 %   Monocytes Relative 6.4 3.0 - 12.0 %   Eosinophils Relative 0.8 0.0 - 5.0 %   Basophils Relative 0.6 0.0 - 3.0 %   Neutro Abs 3.8 1.4 - 7.7 K/uL   Lymphs Abs 1.9 0.7 - 4.0 K/uL   Monocytes Absolute 0.4 0.1 - 1.0 K/uL   Eosinophils Absolute 0.1 0.0 - 0.7 K/uL   Basophils Absolute 0.0 0.0 - 0.1 K/uL  Vitamin D (25 hydroxy)     Status: None   Collection Time: 10/05/17  2:36 PM  Result Value Ref Range   VITD 57.41 30.00 - 100.00 ng/mL  Hemoglobin A1c     Status: Abnormal   Collection Time: 10/05/17  2:36 PM  Result Value Ref Range   Hgb A1c MFr Bld 7.5 (H) 4.6 - 6.5 %    Comment: Glycemic Control Guidelines for People with Diabetes:Non Diabetic:  <6%Goal of Therapy: <7%Additional Action Suggested:  >8%   TSH     Status: None   Collection Time: 10/05/17  2:36 PM  Result Value Ref Range   TSH 1.45  0.35 - 4.50 uIU/mL  T4, free     Status: None   Collection Time: 10/05/17  2:36 PM  Result Value Ref Range  Free T4 1.06 0.60 - 1.60 ng/dL    Comment: Specimens from patients who are undergoing biotin therapy and /or ingesting biotin supplements may contain high levels of biotin.  The higher biotin concentration in these specimens interferes with this Free T4 assay.  Specimens that contain high levels  of biotin may cause false high results for this Free T4 assay.  Please interpret results in light of the total clinical presentation of the patient.    Urinalysis, Routine w reflex microscopic     Status: None   Collection Time: 10/05/17  2:37 PM  Result Value Ref Range   Color, Urine YELLOW YELLOW   APPearance CLEAR CLEAR   Specific Gravity, Urine 1.012 1.001 - 1.03   pH 7.0 5.0 - 8.0   Glucose, UA NEGATIVE NEGATIVE   Bilirubin Urine NEGATIVE NEGATIVE   Ketones, ur NEGATIVE NEGATIVE   Hgb urine dipstick NEGATIVE NEGATIVE   Protein, ur NEGATIVE NEGATIVE   Nitrite NEGATIVE NEGATIVE   Leukocytes, UA NEGATIVE NEGATIVE  Urine Culture     Status: None   Collection Time: 10/05/17  2:37 PM  Result Value Ref Range   MICRO NUMBER: [id number]    SPECIMEN QUALITY: ADEQUATE    Sample Source URINE    STATUS: FINAL    Result:      Multiple organisms present, each less than 10,000 CFU/mL. These organisms, commonly found on external and internal genitalia, are considered to be colonizers. No further testing performed.   Objective  Body mass index is 28.27 kg/m. Wt Readings from Last 3 Encounters:  10/05/17 159 lb 9.6 oz (72.4 kg)  08/21/17 157 lb 4 oz (71.3 kg)  05/30/17 154 lb (69.9 kg)   Temp Readings from Last 3 Encounters:  10/05/17 98.3 F (36.8 C) (Oral)  03/18/17 97.8 F (36.6 C) (Oral)  05/16/16 (!) 96.9 F (36.1 C) (Tympanic)   BP Readings from Last 3 Encounters:  10/05/17 (!) 136/92  08/21/17 128/70  05/30/17 140/60   Pulse Readings from Last 3 Encounters:  10/05/17 77   08/21/17 87  05/30/17 87    Physical Exam  Constitutional: She is oriented to person, place, and time. Vital signs are normal. She appears well-developed and well-nourished. She is cooperative.  HENT:  Head: Normocephalic and atraumatic.  Mouth/Throat: Oropharynx is clear and moist and mucous membranes are normal.  Eyes: Pupils are equal, round, and reactive to light. Conjunctivae are normal.  Cardiovascular: Normal rate. An irregularly irregular rhythm present.  In Afib  Pulmonary/Chest: Effort normal and breath sounds normal.  Neurological: She is alert and oriented to person, place, and time. Gait normal.  Skin: Skin is warm, dry and intact.  Psychiatric: She has a normal mood and affect. Her speech is normal and behavior is normal. Judgment and thought content normal. Cognition and memory are normal.  Nursing note and vitals reviewed.   Assessment   1. DM2 A1C 06/28/17 7.6  2. Hypothyroidism  3. Osteoporosis/penia  4. Chronic constipation now with diarrhea  5. Afib, chronic h/o CAD, chronic diastolic CHF 6. History of ovarian cancer in 1976  7. B/l leg edema L>R DVT negative 04/26/17  8. History of fall 04/2017 with fractures completed PT/OT  9. Urinary frequency and h/o UTI E coli  10. HM Plan  1.  Heck labs today CMET, CBC, UA, culture, vitamin D, A1C, TSH, T4  Continue metformin ER bid  F/u endocrine  Urine protein had 03/29/17  Has podiatrist Dr. Prudence Davidson 10/11/17 Will ask about eye exam  at f/u  2.  See above  Levo 50 mcg  3. Declines DEXA and does not want treatment  4.  Monitor and consider GI consult in future  5. F/u Dr. Rockey Situ  6.  NTD 7.  NTD monitor  Leg elevation  Lasix was previously stopped  Consider compression stockings otc  8.  Had PT/OT 9.  UA and culture today  Consider urology in future  10.  Consider flu shot in future  prevnar had 03/03/16  Check [facility] for pna 23  Tdap had 02/20/08  zostervax had  Consider shingrix  Declines check  MMR  Out of age window:  mammo Pap Colonoscopy  Declines DEXA  She is established with dermatology h/o BCC  Declines MMR check   Other MDS:  Former PCP Estell Harpin NP until 04/2016  [facility] now former Dr. Jacqlyn Larsen  Cards Dr. Rockey Situ  Dermatology Dr. Kellie Moor  GI-Dr. Laverle Hobby eye h/o b/l cataracts  [facility] Pod[facility] Dr. Prudence Davidson    Reviewed labs 11/14/17 seen outside [facility] Klebsiella pna UTI R amp, Cefazolin, I macrobid otherwise sensitive by Rudie Meyer 11/14/17  -of note if pt not better will need to reculture likely due to above  Provider: Dr. [name]-Internal Medicine

## 2017-10-15 DIAGNOSIS — R69 Illness, unspecified: Secondary | ICD-10-CM | POA: Diagnosis not present

## 2017-10-17 ENCOUNTER — Telehealth: Payer: Self-pay | Admitting: Cardiovascular Disease

## 2017-10-17 DIAGNOSIS — D485 Neoplasm of uncertain behavior of skin: Secondary | ICD-10-CM | POA: Diagnosis not present

## 2017-10-17 DIAGNOSIS — X32XXXA Exposure to sunlight, initial encounter: Secondary | ICD-10-CM | POA: Diagnosis not present

## 2017-10-17 DIAGNOSIS — L570 Actinic keratosis: Secondary | ICD-10-CM | POA: Diagnosis not present

## 2017-10-17 DIAGNOSIS — C44729 Squamous cell carcinoma of skin of left lower limb, including hip: Secondary | ICD-10-CM | POA: Diagnosis not present

## 2017-10-17 DIAGNOSIS — Z85828 Personal history of other malignant neoplasm of skin: Secondary | ICD-10-CM | POA: Diagnosis not present

## 2017-10-17 DIAGNOSIS — Z08 Encounter for follow-up examination after completed treatment for malignant neoplasm: Secondary | ICD-10-CM | POA: Diagnosis not present

## 2017-10-17 NOTE — Telephone Encounter (Signed)
Patient calling the office for samples of medication: ° ° °1.  What medication and dosage are you requesting samples for? Xarelto 20 mg po q d  ° °2.  Are you currently out of this medication? No  ° ° ° °

## 2017-10-17 NOTE — Telephone Encounter (Signed)
We are currently out of this. Once we receive some we could provide some along with assistance application.

## 2017-10-17 NOTE — Telephone Encounter (Signed)
Please advise if you would like me to supply patient with samples.

## 2017-10-18 NOTE — Telephone Encounter (Signed)
Notified patient that we were currently out of Xarelto 20MG  samples.  She stated that she does have a prescription on hand so she does not run out.

## 2017-10-24 ENCOUNTER — Encounter: Payer: Self-pay | Admitting: Internal Medicine

## 2017-10-24 DIAGNOSIS — E113293 Type 2 diabetes mellitus with mild nonproliferative diabetic retinopathy without macular edema, bilateral: Secondary | ICD-10-CM | POA: Diagnosis not present

## 2017-10-24 DIAGNOSIS — H353131 Nonexudative age-related macular degeneration, bilateral, early dry stage: Secondary | ICD-10-CM | POA: Diagnosis not present

## 2017-11-08 ENCOUNTER — Encounter: Payer: Self-pay | Admitting: Internal Medicine

## 2017-11-08 ENCOUNTER — Ambulatory Visit: Payer: Medicare HMO | Admitting: Internal Medicine

## 2017-11-08 VITALS — BP 112/68 | HR 78 | Temp 98.4°F | Ht 63.0 in | Wt 159.2 lb

## 2017-11-08 DIAGNOSIS — Z85828 Personal history of other malignant neoplasm of skin: Secondary | ICD-10-CM

## 2017-11-08 DIAGNOSIS — E1149 Type 2 diabetes mellitus with other diabetic neurological complication: Secondary | ICD-10-CM

## 2017-11-08 DIAGNOSIS — I10 Essential (primary) hypertension: Secondary | ICD-10-CM

## 2017-11-08 DIAGNOSIS — E039 Hypothyroidism, unspecified: Secondary | ICD-10-CM

## 2017-11-08 DIAGNOSIS — W19XXXD Unspecified fall, subsequent encounter: Secondary | ICD-10-CM

## 2017-11-08 NOTE — Progress Notes (Addendum)
Chief Complaint  Patient presents with  . Follow-up   F/u  1. 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 DM 2 A1C 7.5 on metformin controlled, hypothyroidism controlled pt wants to come to PCP and endocrine prn will get copy of eye exam from Dr. Ellin Mayhew  3. Ab pain, bloating, cramps, gas and mucus stool she wants to hold on GI referral for now.  4. She asks if I am ok with her driving, she has handicap sticker and has walker but not with seat and cane which she is using today  5. SCC left lower leg Dr. Kellie Moor   Review of Systems  Constitutional: Negative for weight loss.  HENT: Negative for hearing loss.   Eyes: Negative for blurred vision.  Respiratory: Negative for shortness of breath.   Cardiovascular: Negative for chest pain.  Musculoskeletal: Negative for falls.  Skin: Negative for rash.  Neurological: Negative for headaches.  Psychiatric/Behavioral: Negative for depression.   Past Medical History:  Diagnosis Date  . Allergy   . Anxiety   . Balance disorder   . Chronic diastolic CHF (congestive heart failure) ([place])   . Colon polyps   . Coronary artery disease, non-occlusive    a. LHC 11/2003: 10% LAD stenosis, 20% LCx stenosis, and 40% RCA stenosis; b. nuc stress test 12/15: small region of mild ischemia in the apical region with WMA also noted in the apical region, EF 60%. She declined invasive evaluation at that time  . DM2 (diabetes mellitus, type 2) ([place])   . Fall    04/2017 see careeverywhere [facility] multiple fractures, brusing   . GERD (gastroesophageal reflux disease)    esophageal web  . Granuloma annulare    [name].   . Granuloma annulare    since 2010  . History of chicken pox   . History of eating disorder   . HLD (hyperlipidemia)   . HTN (hypertension)   . Hypothyroidism   . IBS (irritable bowel syndrome)     diarrhea   . Incontinence of bowel   . Osteoporosis   . Ovarian cancer ([place])   . Persistent atrial fibrillation ([place])    a. CHADS2VASc = > 8 (CHF, HTN, age x 2, DM, TIA x 2, female); b. on Xarelto  . SBO (small bowel obstruction) ([place])    1998  . TIA (transient ischemic attack)   . TIA (transient ischemic attack)   . UTI (urinary tract infection)   . Venous insufficiency    Past Surgical History:  Procedure Laterality Date  . 2nd look laparotomy  1982  . ABDOMINAL HYSTERECTOMY     for ovarian cancer s/p hysterectomy total 1976 and exp lap 1980  . APPENDECTOMY    . CARDIAC CATHETERIZATION  8/05   neg; a fib found   . cataract OD  2002  . cataract surgery  5/07   R  . CHOLECYSTECTOMY  1986  . DEXA  9/04 and 1/02  . EGD/dilation/colon  1/06  . ESOPHAGOGASTRODUODENOSCOPY (EGD) WITH PROPOFOL N/A 05/16/2016   Procedure: ESOPHAGOGASTRODUODENOSCOPY (EGD) WITH PROPOFOL with dilation;  Surgeon: Lucilla Lame, MD;  Location: [facility] ENDOSCOPY;  Service: Endoscopy;  Laterality: N/A;  . FEMORAL HERNIA REPAIR  1958   R  . fracture L elbow/wrist  2000  . intussception/obstruction  12/98  . JOINT REPLACEMENT  left shoulder  . kidney stone x2  1991  . laminectomy L4-5  1971  . LUMBAR LAMINECTOMY     1970 ruptured disc   . MOUTH SURGERY    . myoview stress  8/05   syncope (-)  . REVERSE SHOULDER ARTHROPLASTY Left 10/06/2014   Procedure: REVERSE SHOULDER ARTHROPLASTY;  Surgeon: Corky Mull, MD;  Location: [facility] ORS;  Service: Orthopedics;  Laterality: Left;  . stillbirth  1969  . TOTAL ABDOMINAL HYSTERECTOMY W/ BILATERAL SALPINGOOPHORECTOMY  1980   ovarian cancer  . WRIST FRACTURE SURGERY  11/05   R   Family History  Problem Relation Age of Onset  . Early death Father   . Diabetes Mother   . Heart disease Mother   . Arthritis Brother   . Cancer Brother   . Depression Brother   . Hearing loss Brother   . Arthritis Brother   . Cancer Brother        colon  . Hearing loss  Brother   . Cancer Brother   . Diabetes Brother   . Hearing loss Brother   . Heart disease Brother   . Hyperlipidemia Brother   . Heart disease Sister   . Hypertension Sister   . Stroke Sister   . Hearing loss Daughter   . Hypertension Daughter   . Diabetes Paternal Grandfather   . Hearing loss Sister   . Heart disease Sister   . Arthritis Sister   . Depression Sister   . Hearing loss Sister   . Heart disease Sister   . COPD Brother   . Early death Brother   . Early death Brother    Social History   Socioeconomic History  . Marital status: Widowed    Spouse name: Not on file  . Number of children: 1  . Years of education: Not on file  . Highest education level: Not on file  Occupational History  . 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 Financial resource strain: Not on file  . Food insecurity:    Worry: Not on file    Inability: Not on file  . Transportation needs:    Medical: Not on file    Non-medical: Not on file  Tobacco Use  . Smoking status: Never Smoker  . Smokeless tobacco: Never Used  Substance and Sexual Activity  . Alcohol use: No  . Drug use: No  . Sexual activity: Not on file  Lifestyle  . Physical activity:    Days per week: Not on file    Minutes per session: Not on file  . Stress: Not on file  Relationships  . Social connections:    Talks on phone: Not on file    Gets together: Not on file    Attends religious service: Not on file    Active member of club or organization: Not on file    Attends meetings of clubs or organizations: Not on file    Relationship status: Not on file  . Intimate partner violence:    Fear of current or ex partner: Not on file    Emotionally abused: Not on file    Physically abused: Not on file    Forced sexual activity: Not on file  Other Topics Concern  . 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 acetaminophen (TYLENOL) 500 MG tablet Take 1,000 mg by mouth every 6 (six) hours as needed for moderate pain or headache.  . Ascorbic Acid (VITAMIN C) POWD Take 200 mg by mouth daily.   . bisoprolol-hydrochlorothiazide (ZIAC) 2.5-6.25 MG tablet TAKE ONE TABLET BY MOUTH EVERY DAY  . Calcium Citrate 200 MG TABS Take by mouth daily.  Marland Kitchen co-enzyme Q-10 30 MG capsule Take 30 mg by mouth 3 (three) times daily.  Marland Kitchen docusate sodium (COLACE) 100 MG capsule Take 100 mg by mouth 2 (two) times daily.  . Folic Acid-Vit O0-HOZ Y24 (FA-VITAMIN B-6-VITAMIN B-12) 2.2-25-0.5 MG TABS Place 1 tablet under the tongue daily.   Marland Kitchen glucose blood (ONE TOUCH ULTRA TEST) test strip Use to test blood sugar once daily E11.49  . levothyroxine (SYNTHROID, LEVOTHROID) 50 MCG tablet Take 50 mcg by mouth daily before breakfast.  . metFORMIN (GLUMETZA) 500 MG (MOD) 24 hr tablet Take 500 mg by mouth 2 (two) times daily with a meal.  . Multiple Vitamin (CALCIUM COMPLEX PO) Take 1 tablet by mouth daily.  . NON FORMULARY Trivita Supplement B12 Takes qd. B6 qd, and folic acid  . Omega-3 1000 MG CAPS Take 2,000 mg by mouth daily.   Alveda Reasons 20 MG TABS tablet TAKE ONE (1) TABLET BY MOUTH EVERY DAY   Allergies  Allergen Reactions  . Penicillins Anaphylaxis and Other (See Comments)    Has patient had a PCN reaction causing immediate rash, facial/tongue/throat swelling, SOB or lightheadedness with hypotension: Yes Has patient had a PCN reaction causing severe rash involving mucus membranes or skin necrosis: No Has patient had a PCN reaction that required hospitalization: No Has patient had a PCN reaction occurring within the last 10 years: No If all of the above answers are "NO", then may proceed with Cephalosporin use.   . Actos [Pioglitazone]     Not  effective   . Amaryl [Glimepiride]     Not effective    . Amiodarone     Thyroid issues   . Atorvastatin Other (See Comments)    Muscle aches  . Ciprofloxacin Other (See Comments)    High blood pressure, aches chest pressure    . Codeine Nausea And Vomiting  . Ezetimibe-Simvastatin Other (See Comments)    Muscle aches, nausea, back pain   . Fosamax [Alendronate]   . Lovastatin Other (See Comments)    Myalgias  . Macrobid [Nitrofurantoin Macrocrystal]     Itching    . Protonix [Pantoprazole Sodium]     Esophageal problems    . Rosiglitazone Maleate Other (See Comments)    Edema  . Statins     Muscle aches to all   . Sulfa Antibiotics     Itching   . Victoza [Liraglutide]     Nausea   . Alendronate Sodium Rash    Dysphagia and ulceration   . Bactrim [Sulfamethoxazole-Trimethoprim] Rash    itching   Recent Results (from the past 2160 hour(s))  Comprehensive metabolic panel     Status: Abnormal   Collection Time: 10/05/17  2:36 PM  Result Value Ref Range   Sodium 139 135 - 145 mEq/L   Potassium 4.0 3.5 - 5.1 mEq/L   Chloride 101 96 - 112 mEq/L   CO2 30 19 - 32 mEq/L   Glucose,  Bld 124 (H) 70 - 99 mg/dL   BUN 13 6 - 23 mg/dL   Creatinine, Ser 0.57 0.40 - 1.20 mg/dL   Total Bilirubin 0.6 0.2 - 1.2 mg/dL   Alkaline Phosphatase 66 39 - 117 U/L   AST 21 0 - 37 U/L   ALT 9 0 - 35 U/L   Total Protein 7.8 6.0 - 8.3 g/dL   Albumin 4.3 3.5 - 5.2 g/dL   Calcium 9.5 8.4 - 10.5 mg/dL   GFR 106.34 >60.00 mL/min  CBC with Differential/Platelet     Status: None   Collection Time: 10/05/17  2:36 PM  Result Value Ref Range   WBC 6.2 4.0 - 10.5 K/uL   RBC 4.04 3.87 - 5.11 Mil/uL   Hemoglobin 13.8 12.0 - 15.0 g/dL   HCT 40.2 36.0 - 46.0 %   MCV 99.4 78.0 - 100.0 fl   MCHC 34.3 30.0 - 36.0 g/dL   RDW 13.2 11.5 - 15.5 %   Platelets 179.0 150.0 - 400.0 K/uL   Neutrophils Relative % 61.2 43.0 - 77.0 %   Lymphocytes Relative 31.0 12.0 - 46.0 %   Monocytes Relative 6.4 3.0 -  12.0 %   Eosinophils Relative 0.8 0.0 - 5.0 %   Basophils Relative 0.6 0.0 - 3.0 %   Neutro Abs 3.8 1.4 - 7.7 K/uL   Lymphs Abs 1.9 0.7 - 4.0 K/uL   Monocytes Absolute 0.4 0.1 - 1.0 K/uL   Eosinophils Absolute 0.1 0.0 - 0.7 K/uL   Basophils Absolute 0.0 0.0 - 0.1 K/uL  Vitamin D (25 hydroxy)     Status: None   Collection Time: 10/05/17  2:36 PM  Result Value Ref Range   VITD 57.41 30.00 - 100.00 ng/mL  Hemoglobin A1c     Status: Abnormal   Collection Time: 10/05/17  2:36 PM  Result Value Ref Range   Hgb A1c MFr Bld 7.5 (H) 4.6 - 6.5 %    Comment: Glycemic Control Guidelines for People with Diabetes:Non Diabetic:  <6%Goal of Therapy: <7%Additional Action Suggested:  >8%   TSH     Status: None   Collection Time: 10/05/17  2:36 PM  Result Value Ref Range   TSH 1.45 0.35 - 4.50 uIU/mL  T4, free     Status: None   Collection Time: 10/05/17  2:36 PM  Result Value Ref Range   Free T4 1.06 0.60 - 1.60 ng/dL    Comment: Specimens from patients who are undergoing biotin therapy and /or ingesting biotin supplements may contain high levels of biotin.  The higher biotin concentration in these specimens interferes with this Free T4 assay.  Specimens that contain high levels  of biotin may cause false high results for this Free T4 assay.  Please interpret results in light of the total clinical presentation of the patient.    Urinalysis, Routine w reflex microscopic     Status: None   Collection Time: 10/05/17  2:37 PM  Result Value Ref Range   Color, Urine YELLOW YELLOW   APPearance CLEAR CLEAR   Specific Gravity, Urine 1.012 1.001 - 1.03   pH 7.0 5.0 - 8.0   Glucose, UA NEGATIVE NEGATIVE   Bilirubin Urine NEGATIVE NEGATIVE   Ketones, ur NEGATIVE NEGATIVE   Hgb urine dipstick NEGATIVE NEGATIVE   Protein, ur NEGATIVE NEGATIVE   Nitrite NEGATIVE NEGATIVE   Leukocytes, UA NEGATIVE NEGATIVE  Urine Culture     Status: None   Collection Time: 10/05/17  2:37 PM  Result Value Ref Range   MICRO  NUMBER: [id number]    SPECIMEN QUALITY: ADEQUATE    Sample Source URINE    STATUS: FINAL    Result:      Multiple organisms present, each less than 10,000 CFU/mL. These organisms, commonly found on external and internal genitalia, are considered to be colonizers. No further testing performed.   Objective  Body mass index is 28.2 kg/m. Wt Readings from Last 3 Encounters:  11/08/17 159 lb 3.2 oz (72.2 kg)  10/05/17 159 lb 9.6 oz (72.4 kg)  08/21/17 157 lb 4 oz (71.3 kg)   Temp Readings from Last 3 Encounters:  11/08/17 98.4 F (36.9 C) (Oral)  10/05/17 98.3 F (36.8 C) (Oral)  03/18/17 97.8 F (36.6 C) (Oral)   BP Readings from Last 3 Encounters:  11/08/17 112/68  10/05/17 (!) 136/92  08/21/17 128/70   Pulse Readings from Last 3 Encounters:  11/08/17 78  10/05/17 77  08/21/17 87    Physical Exam  Constitutional: She is oriented to person, place, and time. Vital signs are normal. She appears well-developed and well-nourished. She is cooperative.  HENT:  Head: Normocephalic and atraumatic.  Mouth/Throat: Oropharynx is clear and moist and mucous membranes are normal.  Eyes: Pupils are equal, round, and reactive to light. Conjunctivae are normal.  Cardiovascular: Normal rate, regular rhythm and normal heart sounds.  Afib   Pulmonary/Chest: Effort normal and breath sounds normal.  Neurological: She is alert and oriented to person, place, and time. Gait normal.  Walking with cane today  Skin: Skin is warm, dry and intact.  Psychiatric: She has a normal mood and affect. Her speech is normal and behavior is normal. Judgment and thought content normal. Cognition and memory are normal.  Nursing note and vitals reviewed.   Assessment   1. Hair loss  2. DM 2 7.5, hypothyroidism  3. Abdominal pain and constipation and diarrhea and bloating with mucous stools  4. H/o falls and wants to know if she can drive  5.SCC left leg  6. HM Plan   1. Disc rogaine pt does not want to try  disc hair loss can be genetic several women in family with hair loss  Disc giovanni tea tree shampoo  Disc biotin she is taking in mvt  Consider check iron levels in future  2. Cont meds  Prn f/u [facility] endocrine  Get eye records Dr. Ellin Mayhew saw 10/24/17 will start to wear glasses for distance  3. Pt wants to hld on GI referral  4. Given Rx for rolling walker with brakes and seat  Will do OT eval for driving  5. 7/82 appt dermatology exc futher left leg SCC  6.  Consider flu shot in future  prevnar had 03/03/16  Check [facility] for pna 23 had 2005 per pt stated had more recently at [facility] advised to look for copy of proof Tdap had 02/20/08  zostervax had  Consider shingrix  Declines check MMR  Out of age window:  mammo Pap Colonoscopy  Declines DEXA  She is established with dermatology h/o [facility] Dr. Clemon Chambers now [facility]    Saw Dr. Roland Rack 02/13/18 left hand numbness and tingling thought left CTS pending endoscopic left CTS release      Provider: Dr. [name]-Internal Medicine

## 2017-11-08 NOTE — Patient Instructions (Addendum)
Try Aron Baba Tea tree shampoo  Biotin vitamin for hair loss   Let me know when you want to see the stomach doctor/GI  Please find copy of pneumonia 23 vaccine  F/u in 4 months    Squamous Cell Carcinoma Squamous cell carcinoma is a common form of skin cancer. It begins in the squamous cells in the outer layer of the skin (epidermis). It occurs most often in parts of the body that are frequently exposed to the sun, such as the face, lips, neck, arms, legs, and hands. However, this condition can occur anywhere on the body, including the inside of the mouth, sites of long-term (chronic) scarring, and the anus. If squamous cell carcinoma is treated soon enough, it rarely spreads to other areas of the body (metastasizes). If it is not treated, it can destroy nearby tissues. In rare cases, it can spread to other areas. What are the causes? This condition is usually caused by exposure to ultraviolet (UV) light. UV light may come from the sun or from tanning beds. Other causes include:  Exposure to arsenic.  Exposure to radiation.  Exposure to toxic tars and oils.  Certain genetic conditions, such as xeroderma pigmentosum.  What increases the risk? This condition is more likely to develop in:  People who are older than 82 years of age.  People who have fair skin (light complexion).  People who have blonde or red hair.  People who have blue, green, or gray eyes.  People who have childhood freckling.  People who have had sun exposure over long periods of time, especially during childhood.  People who have had repeated sunburns.  People who have a weakened immune system.  People who have been exposed to certain chemicals, such as tar, soot, and arsenic.  People who have chronic inflammatory conditions.  People who have chronic infections.  People who have an HPV (human papillomavirus) infection.  People who have conditions that cause chronic scarring. These can include burn  scars, chronic ulcers, heat (thermal) injuries, and radiation.  People who have had psoralen and ultraviolet A (PUVA) treatments.  People who smoke.  People who use tanning beds.  What are the signs or symptoms? This condition often starts as small pink or brown growths on the skin. The growths have a rough surface that may feel like sandpaper. In some cases, the growths are easier to feel than to see. The growths may develop into a sore that does not heal. How is this diagnosed? This condition may be diagnosed with:  A physical exam.  Removal of a tissue sample to be examined under a microscope (biopsy).  How is this treated? Treatment for this condition involves removing the cancerous tissue. The method that is used for this depends on the size and location of the tumors, as well as your overall health. Possible treatments include:  Mohs surgery. In this procedure, the cancerous skin cells are removed layer by layer until all of the tumor has been removed.  Surgical removal (excision) of the tumor. This involves removing the entire tumor and a small amount of normal skin that surrounds it.  Cryosurgery. This involves freezing the tumor with liquid nitrogen.  Plastic surgery. The tumor is removed, and healthy skin from another part of the body is used to cover the wound. This may be done for large tumors that are in areas where it is not possible to stretch the nearby skin to sew the edges of the wound together.  Radiation. This may  be used for tumors on the face.  Photodynamic therapy. A chemical cream is applied to the skin, and light exposure is used to activate the chemical.  Electrodesiccation and curettage. This involves alternately scraping and burning the tumor while using an electric current to control bleeding.  Follow these instructions at home:  Avoid unprotected sun exposure.  Do self-exams as told by your health care provider. Look for new spots or changes in your  skin.  Keep all follow-up visits as told by your health care provider. This is important.  Do not use tobacco products, including cigarettes, chewing tobacco, or e-cigarettes. If you need help quitting, ask your health care provider. How is this prevented?  Avoid the sun when it is the strongest. This is usually between 10:00 a.m. and 4:00 p.m.  When you are out in the sun, use a sunscreen that has a sun protection factor (SPF) of at least 33.  Apply sunscreen at least 30 minutes before exposure to the sun.  Reapply sunscreen every 2-4 hours while you are outside. Also reapply it after swimming and after excessive sweating.  Always wear hats, protective clothing, and UV-blocking sunglasses when you are outdoors.  Do not use tanning beds. Contact a health care provider if:  You notice any new growths or any changes in your skin.  You have had a squamous cell carcinoma tumor removed and you notice a new growth in the same location. This information is not intended to replace advice given to you by your health care provider. Make sure you discuss any questions you have with your health care provider. Document Released: 10/15/2002 Document Revised: 12/07/2015 Document Reviewed: 08/03/2014 Elsevier Interactive Patient Education  [employer].

## 2017-11-08 NOTE — Progress Notes (Signed)
Please enter referral for OT and I will send it. Thanks [employer]

## 2017-11-08 NOTE — Progress Notes (Signed)
Pre visit review using our clinic review tool, if applicable. No additional management support is needed unless otherwise documented below in the visit note. 

## 2017-11-14 DIAGNOSIS — N390 Urinary tract infection, site not specified: Secondary | ICD-10-CM | POA: Diagnosis not present

## 2017-11-16 DIAGNOSIS — C44729 Squamous cell carcinoma of skin of left lower limb, including hip: Secondary | ICD-10-CM | POA: Diagnosis not present

## 2017-12-03 DIAGNOSIS — R69 Illness, unspecified: Secondary | ICD-10-CM | POA: Diagnosis not present

## 2017-12-12 DIAGNOSIS — R300 Dysuria: Secondary | ICD-10-CM | POA: Diagnosis not present

## 2017-12-12 DIAGNOSIS — N390 Urinary tract infection, site not specified: Secondary | ICD-10-CM | POA: Diagnosis not present

## 2017-12-13 ENCOUNTER — Ambulatory Visit: Payer: Medicare HMO | Admitting: Family Medicine

## 2017-12-14 DIAGNOSIS — S81802A Unspecified open wound, left lower leg, initial encounter: Secondary | ICD-10-CM | POA: Diagnosis not present

## 2018-01-07 ENCOUNTER — Encounter: Payer: Self-pay | Admitting: Urology

## 2018-01-07 ENCOUNTER — Ambulatory Visit: Payer: Medicare HMO | Admitting: Urology

## 2018-01-07 VITALS — BP 160/88 | HR 103 | Ht 63.0 in | Wt 159.5 lb

## 2018-01-07 DIAGNOSIS — N390 Urinary tract infection, site not specified: Secondary | ICD-10-CM

## 2018-01-07 DIAGNOSIS — R310 Gross hematuria: Secondary | ICD-10-CM | POA: Diagnosis not present

## 2018-01-07 DIAGNOSIS — N952 Postmenopausal atrophic vaginitis: Secondary | ICD-10-CM | POA: Diagnosis not present

## 2018-01-07 DIAGNOSIS — N3001 Acute cystitis with hematuria: Secondary | ICD-10-CM | POA: Diagnosis not present

## 2018-01-07 LAB — URINALYSIS, COMPLETE
BILIRUBIN UA: NEGATIVE
Glucose, UA: NEGATIVE
Ketones, UA: NEGATIVE
Nitrite, UA: NEGATIVE
PH UA: 5.5 (ref 5.0–7.5)
Specific Gravity, UA: 1.02 (ref 1.005–1.030)
Urobilinogen, Ur: 0.2 mg/dL (ref 0.2–1.0)

## 2018-01-07 LAB — MICROSCOPIC EXAMINATION

## 2018-01-07 LAB — BLADDER SCAN AMB NON-IMAGING

## 2018-01-07 NOTE — Progress Notes (Signed)
01/07/2018 5:02 PM   Jodi Reeves [birth year] [id number]  Referring provider: [name], Jodi Glow, [place], [place] [address]  Chief Complaint  Patient presents with  . Dysuria  . Recurrent UTI    HPI: Patient is a 84 -year-old Caucasian female who is referred to [place] by Dr. Olivia Mackie Jodi Reeves for UTI and dysuria.    Patient states that she has had 3 urinary tract infections over the last year.  Reviewing her records,  she has had two positive urine cultures. + Klebsiella pneumoniae resistant to ampicillin on November 14, 2017 + E. coli resistant to ampicillin, cefazolin, ceftriaxone and cefuroxime on December 12, 2017  Her symptoms with a urinary tract infection consist of frequency, urgency, dysuria and gross hematuria.  Yesterday, she states that she feels like she has a flu bug with malaise and nausea.  Patient denies any suprapubic/flank pain.  Patient denies any fevers, chills, nausea or vomiting.   She is having frequency x not as much as at night, strong urgency, nocturia x q 1 hour and urge > stress incontinence x 1-2 liners.  Her UA today is positive for 3-10 RBC's.  Her PVR is 75 mL.     She does have a history of nephrolithiasis that was passed spontaneously, no history of  GU surgery or GU trauma.   She is not sexually active.  She is not postmenopausal.   She admits to diarrhea > constipation.    She does engage in good perineal hygiene. She does not take tub baths.   She is drinking one to two glasses of water daily.   She has one or two cup of coffee daily.  She is also drinking a glass of cranberry daily.  She cannot drink a lot due to her reflux/esophagitis.    She has a history of DM and ovarian cancer treated with radiation.    PMH: Past Medical History:  Diagnosis Date  . Allergy   . Anxiety   . Balance disorder   . Chronic diastolic CHF (congestive heart failure) ([place])   . Colon polyps   . Coronary artery disease,  non-occlusive    a. LHC 11/2003: 10% LAD stenosis, 20% LCx stenosis, and 40% RCA stenosis; b. nuc stress test 12/15: small region of mild ischemia in the apical region with WMA also noted in the apical region, EF 60%. She declined invasive evaluation at that time  . DM2 (diabetes mellitus, type 2) ([place])   . Fall    04/2017 see care[facility]where [facility] multiple fractures, brusing   . GERD (gastroesophageal reflux disease)    esophageal web  . Granuloma annulare    [name].   . Granuloma annulare    since 2010  . History of chicken pox   . History of eating disorder   . HLD (hyperlipidemia)   . HTN (hypertension)   . Hypothyroidism   . IBS (irritable bowel syndrome)    diarrhea   . Incontinence of bowel   . Osteoporosis   . Ovarian cancer ([place])   . Persistent atrial fibrillation ([place])    a. CHADS2VASc = > 8 (CHF, HTN, age x 2, DM, TIA x 2, female); b. on Xarelto  . SBO (small bowel obstruction) ([place])    1998  . TIA (transient ischemic attack)   . TIA (transient ischemic attack)   . UTI (urinary tract infection)   . Venous insufficiency     Surgical History: Past Surgical History:  Procedure Laterality  Date  . 2nd look laparotomy  1982  . ABDOMINAL HYSTERECTOMY     for ovarian cancer s/p hysterectomy total 1976 and exp lap 1980  . APPENDECTOMY    . CARDIAC CATHETERIZATION  8/05   neg; a fib found   . cataract OD  2002  . cataract surgery  5/07   R  . CHOLECYSTECTOMY  1986  . DEXA  9/04 and 1/02  . EGD/dilation/colon  1/06  . ESOPHAGOGASTRODUODENOSCOPY (EGD) WITH PROPOFOL N/A 05/16/2016   Procedure: ESOPHAGOGASTRODUODENOSCOPY (EGD) WITH PROPOFOL with dilation;  Surgeon: Lucilla Lame, MD;  Location: [facility] ENDOSCOPY;  Service: Endoscopy;  Laterality: N/A;  . FEMORAL HERNIA REPAIR  1958   R  . fracture L elbow/wrist  2000  . intussception/obstruction  12/98  . JOINT REPLACEMENT     left shoulder  . kidney stone x2  1991  . laminectomy L4-5  1971  . LUMBAR LAMINECTOMY      1970 ruptured disc   . MOUTH SURGERY    . myoview stress  8/05   syncope (-)  . REVERSE SHOULDER ARTHROPLASTY Left 10/06/2014   Procedure: REVERSE SHOULDER ARTHROPLASTY;  Surgeon: Corky Mull, MD;  Location: [facility] ORS;  Service: Orthopedics;  Laterality: Left;  . stillbirth  1969  . TOTAL ABDOMINAL HYSTERECTOMY W/ BILATERAL SALPINGOOPHORECTOMY  1980   ovarian cancer  . WRIST FRACTURE SURGERY  11/05   R    Home Medications:  Allergies as of 01/07/2018      Reactions   Penicillins Anaphylaxis, Other (See Comments)   Has patient had a PCN reaction causing immediate rash, facial/tongue/throat swelling, SOB or lightheadedness with hypotension: Yes Has patient had a PCN reaction causing severe rash involving mucus membranes or skin necrosis: No Has patient had a PCN reaction that required hospitalization: No Has patient had a PCN reaction occurring within the last 10 years: No If all of the above answers are "NO", then may proceed with Cephalosporin use.   Actos [pioglitazone]    Not effective    Amaryl [glimepiride]    Not effective    Amiodarone    Thyroid issues    Atorvastatin Other (See Comments)   Muscle aches   Ciprofloxacin Other (See Comments)   High blood pressure, aches chest pressure    Codeine Nausea And Vomiting   Ezetimibe-simvastatin Other (See Comments)   Muscle aches, nausea, back pain    Fosamax [alendronate]    Lovastatin Other (See Comments)   Myalgias   Macrobid [nitrofurantoin Macrocrystal]    Itching    Protonix [pantoprazole Sodium]    Esophageal problems    Rosiglitazone Maleate Other (See Comments)   Edema   Statins    Muscle aches to all    Sulfa Antibiotics    Itching   Victoza [liraglutide]    Nausea   Alendronate Sodium Rash   Dysphagia and ulceration    Bactrim [sulfamethoxazole-trimethoprim] Rash   itching      Medication List        Accurate as of 01/07/18 11:59 PM. Always use your most recent med list.          acetaminophen  500 MG tablet Commonly known as:  TYLENOL Take 1,000 mg by mouth every 6 (six) hours as needed for moderate pain or headache.   BD PEN NEEDLE NANO U/F 32G X 4 MM Misc Generic drug:  Insulin Pen Needle   bisoprolol-hydrochlorothiazide 2.5-6.25 MG tablet Commonly known as:  ZIAC TAKE ONE TABLET BY MOUTH EVERY DAY  Calcium Citrate 200 MG Tabs Take by mouth daily.   CALCIUM COMPLEX PO Take 1 tablet by mouth daily.   co-enzyme Q-10 30 MG capsule Take 30 mg by mouth 3 (three) times daily.   docusate sodium 100 MG capsule Commonly known as:  COLACE Take 100 mg by mouth 2 (two) times daily.   FA-Vitamin B-6-Vitamin B-12 2.2-25-0.5 MG Tabs Place 1 tablet under the tongue daily.   glucose blood test strip Use to test blood sugar once daily E11.49   HYDROcodone-acetaminophen 5-325 MG tablet Commonly known as:  NORCO/VICODIN hydrocodone 5 mg-acetaminophen 325 mg tablet   levothyroxine 50 MCG tablet Commonly known as:  SYNTHROID, LEVOTHROID Take 50 mcg by mouth daily before breakfast.   metFORMIN 500 MG 24 hr tablet Commonly known as:  GLUCOPHAGE-XR   NON FORMULARY [place] Supplement B12 Takes qd. B6 qd, and folic acid   Omega-3 1914 MG Caps Take 2,000 mg by mouth daily.   Vitamin C Powd Take 200 mg by mouth daily.   XARELTO 20 MG Tabs tablet Generic drug:  rivaroxaban TAKE ONE (1) TABLET BY MOUTH EVERY DAY       Allergies:  Allergies  Allergen Reactions  . Penicillins Anaphylaxis and Other (See Comments)    Has patient had a PCN reaction causing immediate rash, facial/tongue/throat swelling, SOB or lightheadedness with hypotension: Yes Has patient had a PCN reaction causing severe rash involving mucus membranes or skin necrosis: No Has patient had a PCN reaction that required hospitalization: No Has patient had a PCN reaction occurring within the last 10 years: No If all of the above answers are "NO", then may proceed with Cephalosporin use.   . Actos  [Pioglitazone]     Not effective   . Amaryl [Glimepiride]     Not effective    . Amiodarone     Thyroid issues   . Atorvastatin Other (See Comments)    Muscle aches  . Ciprofloxacin Other (See Comments)    High blood pressure, aches chest pressure    . Codeine Nausea And Vomiting  . Ezetimibe-Simvastatin Other (See Comments)    Muscle aches, nausea, back pain   . Fosamax [Alendronate]   . Lovastatin Other (See Comments)    Myalgias  . Macrobid [Nitrofurantoin Macrocrystal]     Itching    . Protonix [Pantoprazole Sodium]     Esophageal problems    . Rosiglitazone Maleate Other (See Comments)    Edema  . Statins     Muscle aches to all   . Sulfa Antibiotics     Itching   . Victoza [Liraglutide]     Nausea   . Alendronate Sodium Rash    Dysphagia and ulceration   . Bactrim [Sulfamethoxazole-Trimethoprim] Rash    itching    Family History: Family History  Problem Relation Age of Onset  . Early death Father   . Diabetes Mother   . Heart disease Mother   . Arthritis Brother   . Cancer Brother   . Depression Brother   . Hearing loss Brother   . Arthritis Brother   . Cancer Brother        colon  . Hearing loss Brother   . Cancer Brother   . Diabetes Brother   . Hearing loss Brother   . Heart disease Brother   . Hyperlipidemia Brother   . Heart disease Sister   . Hypertension Sister   . Stroke Sister   . Hearing loss Daughter   . Hypertension Daughter   .  Diabetes Paternal Grandfather   . Hearing loss Sister   . Heart disease Sister   . Arthritis Sister   . Depression Sister   . Hearing loss Sister   . Heart disease Sister   . COPD Brother   . Early death Brother   . Early death Brother     Social History:  reports that she has never smoked. She has never used smokeless tobacco. She reports that she does not drink alcohol or use drugs.  ROS: UROLOGY Frequent Urination?: Yes Hard to postpone urination?: Yes Burning/pain with urination?: No Get  up at night to urinate?: Yes Leakage of urine?: Yes Urine stream starts and stops?: No Trouble starting stream?: No Do you have to strain to urinate?: No Blood in urine?: No Urinary tract infection?: No Sexually transmitted disease?: No Injury to kidneys or bladder?: No Painful intercourse?: No Weak stream?: No Currently pregnant?: No Vaginal bleeding?: No Last menstrual period?: Hysterectomy   Gastrointestinal Nausea?: Yes Vomiting?: No Indigestion/heartburn?: Yes Diarrhea?: Yes Constipation?: Yes  Constitutional Fever: No Night sweats?: No Weight loss?: No Fatigue?: Yes  Skin Skin rash/lesions?: Yes Itching?: Yes  Eyes Blurred vision?: No Double vision?: No  Ears/Nose/Throat Sore throat?: No Sinus problems?: No  Hematologic/Lymphatic Swollen glands?: No Easy bruising?: Yes  Cardiovascular Leg swelling?: Yes Chest pain?: No  Respiratory Cough?: No Shortness of breath?: No  Endocrine Excessive thirst?: No  Musculoskeletal Back pain?: Yes Joint pain?: Yes  Neurological Headaches?: Yes Dizziness?: No  Psychologic Depression?: No Anxiety?: No  Physical Exam: BP (!) 160/88 (BP Location: Left Arm, Patient Position: Sitting, Cuff Size: Normal)   Pulse (!) 103   Ht '5\' 3"'  (1.6 m)   Wt 159 lb 8 oz (72.3 kg)   BMI 28.25 kg/m   Constitutional:  Well nourished. Alert and oriented, No acute distress. HEENT: [place] AT, moist mucus membranes.  Trachea midline, no masses. Cardiovascular: No clubbing, cyanosis, or edema. Respiratory: Normal respiratory effort, no increased work of breathing. GI: Abdomen is soft, non tender, non distended, no abdominal masses. Liver and spleen not palpable.  No hernias appreciated.  Stool sample for occult testing is not indicated.   GU: No CVA tenderness.  No bladder fullness or masses.  Atrophic external genitalia, normal pubic hair distribution, no lesions.  Normal urethral meatus, no lesions, no prolapse, no discharge.    Urethral caruncle noted.  No bladder fullness, tenderness or masses. Pale vagina mucosa, poor estrogen effect, no discharge, no lesions, poor pelvic support, grade I cystocele and no  rectocele noted.  Cervix, uterus and adnexa are absent.  Anus and perineum are without rashes or lesions.    Skin: No rashes, bruises or suspicious lesions. Lymph: No cervical or inguinal adenopathy. Neurologic: Grossly intact, no focal deficits, moving all 4 extremities. Psychiatric: Normal mood and affect.  Laboratory Data: Lab Results  Component Value Date   WBC 6.2 10/05/2017   HGB 13.8 10/05/2017   HCT 40.2 10/05/2017   MCV 99.4 10/05/2017   PLT 179.0 10/05/2017    Lab Results  Component Value Date   CREATININE 0.69 01/07/2018    No results found for: PSA  No results found for: TESTOSTERONE  Lab Results  Component Value Date   HGBA1C 7.5 (H) 10/05/2017    Lab Results  Component Value Date   TSH 1.45 10/05/2017       Component Value Date/Time   CHOL 209 (H) 02/24/2014 1423   CHOL 187 11/17/2011 0849   HDL 51.60 02/24/2014 1423  HDL 56 11/17/2011 0849   CHOLHDL 4 02/24/2014 1423   VLDL 33.6 02/24/2014 1423   LDLCALC 124 (H) 02/24/2014 1423   LDLCALC 104 (H) 11/17/2011 0849    Lab Results  Component Value Date   AST 21 10/05/2017   Lab Results  Component Value Date   ALT 9 10/05/2017   No components found for: ALKALINEPHOPHATASE No components found for: BILIRUBINTOTAL  No results found for: ESTRADIOL  Urinalysis 3-10 RBC's.  See Epic. I have reviewed the labs.   Pertinent Imaging: Results for Jodi Reeves, Jodi Reeves (MRN [id number]) as of 01/10/2018 16:37  Ref. Range 01/07/2018 14:33  Scan Result Unknown 34m      Assessment & Plan:    1. rUTI's Criteria for recurrent UTI has been met with 2 or more infections in 6 months or 3 or greater infections in one year - will request records from the urgent care Patient's fluid intake is limited due to GI issues Patient is  drinking cranberry juice daily  2. Gross hematuria UA is positive for microhematuria only Explained to the patient that hematuria can be associated with urinary tract infections, but the blood may also be the result of a urological cancer or stones Per AUA guidelines a CT urogram is the preferred imaging study to evaluate hematuria. I explained to the patient that a contrast material will be injected into a vein and that in rare instances, an allergic reaction can result and may even life threatening   The patient denies any allergies to contrast, iodine and/or seafood and is taking metformin Following the imaging study,  I've recommended a cystoscopy. I described how this is performed, typically in an office setting with a flexible cystoscope. We described the risks, benefits, and possible side effects, the most common of which is a minor amount of blood in the urine and/or burning which usually resolves in 24 to 48 hours.   The patient had the opportunity to ask questions which were answered. Based upon this discussion, the patient is willing to proceed. Therefore, I've ordered: a CT Urogram and cystoscopy. The patient will return following all of the above for discussion of the results.  UA Urine culture BUN + creatinine                                             Return for CT Urogram report and cystoscopy.  These notes generated with voice recognition software. I apologize for typographical errors.  SZara Council PA-C  [facility] [address] [place] [phone]

## 2018-01-08 LAB — BUN+CREAT
BUN / CREAT RATIO: 19 (ref 12–28)
BUN: 13 mg/dL (ref 8–27)
Creatinine, Ser: 0.69 mg/dL (ref 0.57–1.00)
GFR calc Af Amer: 90 mL/min/{1.73_m2} (ref >59)
GFR calc non Af Amer: 78 mL/min/{1.73_m2} (ref >59)

## 2018-01-09 LAB — CULTURE, URINE COMPREHENSIVE

## 2018-01-17 ENCOUNTER — Ambulatory Visit: Payer: Medicare HMO | Admitting: Podiatry

## 2018-01-17 ENCOUNTER — Encounter: Payer: Self-pay | Admitting: Podiatry

## 2018-01-17 DIAGNOSIS — B351 Tinea unguium: Secondary | ICD-10-CM | POA: Diagnosis not present

## 2018-01-17 DIAGNOSIS — M79676 Pain in unspecified toe(s): Secondary | ICD-10-CM | POA: Diagnosis not present

## 2018-01-17 DIAGNOSIS — D689 Coagulation defect, unspecified: Secondary | ICD-10-CM

## 2018-01-17 DIAGNOSIS — E1140 Type 2 diabetes mellitus with diabetic neuropathy, unspecified: Secondary | ICD-10-CM

## 2018-01-17 DIAGNOSIS — E1149 Type 2 diabetes mellitus with other diabetic neurological complication: Secondary | ICD-10-CM

## 2018-01-17 NOTE — Progress Notes (Signed)
Complaint:  Visit Type: Patient returns to my office for continued preventative foot care services. Complaint: Patient states" my nails have grown long and thick and become painful to walk and wear shoes" Patient has been diagnosed with DM with neuropathy.. The patient presents for preventative foot care services. No changes to ROS.  Patient is taking xarelto.  Podiatric Exam: Vascular: dorsalis pedis and posterior tibial pulses are palpable bilateral. Capillary return is immediate. Temperature gradient is WNL. Skin turgor WNL  Sensorium: Diminished  Semmes Weinstein monofilament test. Normal tactile sensation bilaterally. Nail Exam: Pt has thick disfigured discolored nails with subungual debris noted bilateral entire nail hallux through fifth toenails Ulcer Exam: There is no evidence of ulcer or pre-ulcerative changes or infection. Orthopedic Exam: Muscle tone and strength are WNL. No limitations in general ROM. No crepitus or effusions noted. Foot type and digits show no abnormalities. Bony prominences are unremarkable.  Skin: No Porokeratosis. No infection or ulcers  Diagnosis:  Onychomycosis, , Pain in right toe, pain in left toes  Treatment & Plan Procedures and Treatment: Consent by patient was obtained for treatment procedures. The patient understood the discussion of treatment and procedures well. All questions were answered thoroughly reviewed. Debridement of mycotic and hypertrophic toenails, 1 through 5 bilateral and clearing of subungual debris. No ulceration, no infection noted.  Patient has healed second toe distal aspect following previous injury. Return Visit-Office Procedure: Patient instructed to return to the office for a follow up visit 3 months for continued evaluation and treatment.    Gardiner Barefoot DPM

## 2018-01-21 ENCOUNTER — Ambulatory Visit
Admission: RE | Admit: 2018-01-21 | Discharge: 2018-01-21 | Disposition: A | Discharge status: Home / Self Care | Payer: Medicare HMO | Source: Ambulatory Visit | Attending: Urology | Admitting: Urology

## 2018-01-21 DIAGNOSIS — R310 Gross hematuria: Secondary | ICD-10-CM

## 2018-01-21 DIAGNOSIS — I700 Atherosclerosis of aorta: Secondary | ICD-10-CM | POA: Diagnosis not present

## 2018-01-21 DIAGNOSIS — M419 Scoliosis, unspecified: Secondary | ICD-10-CM | POA: Insufficient documentation

## 2018-01-21 DIAGNOSIS — N390 Urinary tract infection, site not specified: Secondary | ICD-10-CM | POA: Diagnosis not present

## 2018-01-21 DIAGNOSIS — N323 Diverticulum of bladder: Secondary | ICD-10-CM | POA: Diagnosis not present

## 2018-01-21 DIAGNOSIS — I2510 Atherosclerotic heart disease of native coronary artery without angina pectoris: Secondary | ICD-10-CM | POA: Diagnosis not present

## 2018-01-21 MED ORDER — IOPAMIDOL (ISOVUE-300) INJECTION 61%
100.0000 mL | Freq: Once | INTRAVENOUS | Status: AC | PRN
  Administered 2018-01-21: 100 mL via INTRAVENOUS

## 2018-01-24 DIAGNOSIS — R69 Illness, unspecified: Secondary | ICD-10-CM | POA: Diagnosis not present

## 2018-01-29 ENCOUNTER — Other Ambulatory Visit: Payer: Self-pay

## 2018-01-29 ENCOUNTER — Ambulatory Visit: Payer: Medicare HMO | Admitting: Urology

## 2018-01-29 ENCOUNTER — Encounter: Payer: Self-pay | Admitting: Urology

## 2018-01-29 VITALS — BP 158/78 | HR 106 | Ht 63.0 in | Wt 156.0 lb

## 2018-01-29 DIAGNOSIS — R310 Gross hematuria: Secondary | ICD-10-CM

## 2018-01-29 LAB — URINALYSIS, COMPLETE
Bilirubin, UA: NEGATIVE
GLUCOSE, UA: NEGATIVE
KETONES UA: NEGATIVE
Leukocytes, UA: NEGATIVE
NITRITE UA: NEGATIVE
Protein, UA: NEGATIVE
RBC, UA: NEGATIVE
SPEC GRAV UA: 1.02 (ref 1.005–1.030)
UUROB: 0.2 mg/dL (ref 0.2–1.0)
pH, UA: 5 (ref 5.0–7.5)

## 2018-01-29 LAB — MICROSCOPIC EXAMINATION
Bacteria, UA: NONE SEEN
Epithelial Cells (non renal): NONE SEEN /hpf (ref 0–10)

## 2018-01-29 NOTE — Progress Notes (Signed)
Cystoscopy Procedure Note:  Indication: Microscopic hematuria  After informed consent and discussion of the procedure and its risks, Jodi Reeves was positioned and prepped in the standard fashion. Cystoscopy was performed with a flexible cystoscope. The urethra, bladder neck and entire bladder was visualized in a standard fashion.  The bladder mucosa was grossly normal, mild bladder trabeculations.  The ureteral orifices were visualized in their normal location and orientation.  Two small bladder diverticula at posterior wall.  Imaging: CT urogram dated 01/21/2018 personally reviewed: No hydronephrosis, no renal masses, no filling defects, no nephrolithiasis, 2 bladder diverticula.  Findings: No concerning findings on cystoscopy  Assessment and Plan: In summary, Jodi Reeves is an 84 year old female with recurrent UTIs and microscopic hematuria. Microscopic hematuria work-up negative for malignancy.  Her primary complaint is urinary frequency and nocturia.  I discussed timed voiding and behavioral strategies at length.  Will also try 25 mg Myrbetriq daily, free samples provided today.  Regarding her UTIs, I encouraged adequate hydration, timed voiding, cranberry supplementation, and improved perineal hygiene.  Follow-up with Larene Beach in 4 to 6 weeks to discuss urinary symptoms, and recurrent UTIs.  Nickolas Madrid, MD 01/29/2018

## 2018-02-12 ENCOUNTER — Other Ambulatory Visit: Payer: Self-pay | Admitting: Cardiovascular Disease

## 2018-02-13 DIAGNOSIS — G5602 Carpal tunnel syndrome, left upper limb: Secondary | ICD-10-CM | POA: Diagnosis not present

## 2018-02-15 DIAGNOSIS — E785 Hyperlipidemia, unspecified: Secondary | ICD-10-CM | POA: Diagnosis not present

## 2018-02-15 DIAGNOSIS — E1169 Type 2 diabetes mellitus with other specified complication: Secondary | ICD-10-CM | POA: Diagnosis not present

## 2018-02-15 DIAGNOSIS — I10 Essential (primary) hypertension: Secondary | ICD-10-CM | POA: Diagnosis not present

## 2018-02-15 DIAGNOSIS — E1159 Type 2 diabetes mellitus with other circulatory complications: Secondary | ICD-10-CM | POA: Diagnosis not present

## 2018-02-15 DIAGNOSIS — E1142 Type 2 diabetes mellitus with diabetic polyneuropathy: Secondary | ICD-10-CM | POA: Diagnosis not present

## 2018-02-15 DIAGNOSIS — E039 Hypothyroidism, unspecified: Secondary | ICD-10-CM | POA: Diagnosis not present

## 2018-02-18 DIAGNOSIS — R69 Illness, unspecified: Secondary | ICD-10-CM | POA: Diagnosis not present

## 2018-02-21 ENCOUNTER — Ambulatory Visit: Payer: Medicare HMO

## 2018-02-21 ENCOUNTER — Ambulatory Visit: Payer: Medicare HMO | Admitting: Family Medicine

## 2018-02-21 VITALS — BP 147/88 | HR 112 | Ht 63.0 in

## 2018-02-21 DIAGNOSIS — T8189XA Other complications of procedures, not elsewhere classified, initial encounter: Secondary | ICD-10-CM | POA: Diagnosis not present

## 2018-02-21 DIAGNOSIS — L97829 Non-pressure chronic ulcer of other part of left lower leg with unspecified severity: Secondary | ICD-10-CM | POA: Diagnosis not present

## 2018-02-21 DIAGNOSIS — L858 Other specified epidermal thickening: Secondary | ICD-10-CM | POA: Diagnosis not present

## 2018-02-21 DIAGNOSIS — N390 Urinary tract infection, site not specified: Secondary | ICD-10-CM | POA: Diagnosis not present

## 2018-02-21 DIAGNOSIS — L988 Other specified disorders of the skin and subcutaneous tissue: Secondary | ICD-10-CM | POA: Diagnosis not present

## 2018-02-21 DIAGNOSIS — L821 Other seborrheic keratosis: Secondary | ICD-10-CM | POA: Diagnosis not present

## 2018-02-21 DIAGNOSIS — Z85828 Personal history of other malignant neoplasm of skin: Secondary | ICD-10-CM | POA: Diagnosis not present

## 2018-02-21 DIAGNOSIS — Z08 Encounter for follow-up examination after completed treatment for malignant neoplasm: Secondary | ICD-10-CM | POA: Diagnosis not present

## 2018-02-21 LAB — MICROSCOPIC EXAMINATION
Epithelial Cells (non renal): NONE SEEN /hpf (ref 0–10)
RBC, UA: NONE SEEN /hpf (ref 0–2)

## 2018-02-21 LAB — URINALYSIS, COMPLETE
BILIRUBIN UA: NEGATIVE
Glucose, UA: NEGATIVE
Ketones, UA: NEGATIVE
Nitrite, UA: NEGATIVE
PH UA: 7 (ref 5.0–7.5)
PROTEIN UA: NEGATIVE
Specific Gravity, UA: 1.015 (ref 1.005–1.030)
Urobilinogen, Ur: 0.2 mg/dL (ref 0.2–1.0)

## 2018-02-21 NOTE — Progress Notes (Signed)
Patient presents today with urinary frequency and urgency. Her symptoms began 4 days ago. A urine was collected for UA, UCX. Patient states she has not been on ABX or had any Urological surgeries in the last 30 days. Larene Beach reviewed the Ua and we will wait to see what the Culture shows before prescribing ABX.

## 2018-02-24 LAB — CULTURE, URINE COMPREHENSIVE

## 2018-02-25 ENCOUNTER — Other Ambulatory Visit: Payer: Self-pay

## 2018-02-25 ENCOUNTER — Telehealth: Payer: Self-pay

## 2018-02-25 DIAGNOSIS — N390 Urinary tract infection, site not specified: Secondary | ICD-10-CM

## 2018-02-25 NOTE — Telephone Encounter (Signed)
Patient notified, referral placed.

## 2018-02-25 NOTE — Telephone Encounter (Signed)
-----   Message from [name], PA-C sent at 02/25/2018  8:23 AM EST ----- We will need to refer [name] to infectious disease as her urine culture is positive and she is either allergic to an oral antibiotic or the bacteria is resistant to an oral antibiotic.

## 2018-02-26 ENCOUNTER — Telehealth: Payer: Self-pay | Admitting: Urology

## 2018-02-26 NOTE — Telephone Encounter (Signed)
Pt lmom stating she received a phone call yesterday from who she believed to be Morey Hummingbird but can't remember, states she was in the middle of chaos and wasn't able to understand exactly what was told to her, now she feels really confused about what she is to do next, states something about infectious disease was mentioned and that has her worried and confused as to what she is to do next. Please advise the pt at [phone]. Thank you.

## 2018-02-27 ENCOUNTER — Encounter: Payer: Self-pay | Admitting: *Deleted

## 2018-02-27 ENCOUNTER — Other Ambulatory Visit: Payer: Self-pay

## 2018-02-27 NOTE — Anesthesia Preprocedure Evaluation (Addendum)
Anesthesia Evaluation  Patient identified by MRN, date of birth, ID band Patient awake    Reviewed: Allergy & Precautions, NPO status , Patient's Chart, lab work & pertinent test results  History of Anesthesia Complications Negative for: history of anesthetic complications  Airway Mallampati: III  TM Distance: >3 FB Neck ROM: Full    Dental   Bridges :   Pulmonary neg pulmonary ROS,    Pulmonary exam normal breath sounds clear to auscultation       Cardiovascular hypertension, + CAD and +CHF  + dysrhythmias (a fib on xarelto)  Rhythm:Irregular Rate:Normal  ECG 08/21/17: atrial fibrillation; left axis deviation; septal infarct, age undetermined  Myocardial perfusion 04/06/17:   Horizontal ST segment depression ST segment depression of 1 mm was noted during stress in the II, III and aVF leads.  The study is normal.  This is a low risk study.  The left ventricular ejection fraction is hyperdynamic (>65%).   Neuro/Psych PSYCHIATRIC DISORDERS Anxiety Depression TIAnegative neurological ROS     GI/Hepatic GERD  ,Esophageal web, hx SBO, IBS   Endo/Other  diabetes, Type 2Hypothyroidism   Renal/GU negative Renal ROS     Musculoskeletal   Abdominal   Peds  Hematology negative hematology ROS (+)   Anesthesia Other Findings Cardiology note 08/21/17:  ASSESSMENT AND PLAN:  Mixed hyperlipidemia  CT scan including mild coronary calcifications, aortic arch calcifications, moderate aortic atherosclerosis in the abdominal aorta  does not want a statin stable  No further work-up  Essential hypertension, benign - Plan: EKG 12-Lead Blood pressure is well controlled on today's visit. No changes made to the medications.  Persistent atrial fibrillation (HCC) Heart rate well controlled,  Tolerating Xarelto, no recent falls No changes made  Dark urine She is concerned about hematuria but no proof of this Recommended if  she has any other days with very dark urine that she contact primary care, may need urinalysis  if she does have confirmation of hematuria would likely benefit from urology but currently reports urine is back to normal color  Chronic diastolic CHF (congestive heart failure) (HCC) Takes Lasix daily, occasionally with extra dose potassium daily Appears euvolemic   Total encounter time more than 45 minutes  Greater than 50% was spent in counseling and coordination of care with the patient  Disposition:   F/U  12 months  Reproductive/Obstetrics                            Anesthesia Physical Anesthesia Plan  ASA: III  Anesthesia Plan: General and Bier Block and Bier Block-LIDOCAINE ONLY   Post-op Pain Management:  [facility] for Post-op pain and GA combined w/ [facility] for post-op pain   Induction: Intravenous  PONV Risk Score and Plan: 3 and Propofol infusion and TIVA  Airway Management Planned: Natural Airway  Additional Equipment:   Intra-op Plan:   Post-operative Plan:   Informed Consent: I have reviewed the patients History and Physical, chart, labs and discussed the procedure including the risks, benefits and alternatives for the proposed anesthesia with the patient or authorized representative who has indicated his/her understanding and acceptance.     Plan Discussed with: CRNA  Anesthesia Plan Comments:         Anesthesia Quick Evaluation

## 2018-02-28 ENCOUNTER — Encounter: Payer: Self-pay | Admitting: Infectious Diseases

## 2018-02-28 ENCOUNTER — Ambulatory Visit: Payer: Medicare HMO | Attending: Infectious Diseases | Admitting: Infectious Diseases

## 2018-02-28 VITALS — BP 145/83 | HR 89

## 2018-02-28 DIAGNOSIS — Z888 Allergy status to other drugs, medicaments and biological substances status: Secondary | ICD-10-CM

## 2018-02-28 DIAGNOSIS — Z90710 Acquired absence of both cervix and uterus: Secondary | ICD-10-CM | POA: Diagnosis not present

## 2018-02-28 DIAGNOSIS — N342 Other urethritis: Secondary | ICD-10-CM

## 2018-02-28 DIAGNOSIS — I4891 Unspecified atrial fibrillation: Secondary | ICD-10-CM | POA: Diagnosis not present

## 2018-02-28 DIAGNOSIS — Z881 Allergy status to other antibiotic agents status: Secondary | ICD-10-CM

## 2018-02-28 DIAGNOSIS — E079 Disorder of thyroid, unspecified: Secondary | ICD-10-CM

## 2018-02-28 DIAGNOSIS — C44729 Squamous cell carcinoma of skin of left lower limb, including hip: Secondary | ICD-10-CM

## 2018-02-28 DIAGNOSIS — Z79899 Other long term (current) drug therapy: Secondary | ICD-10-CM

## 2018-02-28 DIAGNOSIS — Z90722 Acquired absence of ovaries, bilateral: Secondary | ICD-10-CM

## 2018-02-28 DIAGNOSIS — Z8719 Personal history of other diseases of the digestive system: Secondary | ICD-10-CM

## 2018-02-28 DIAGNOSIS — Z885 Allergy status to narcotic agent status: Secondary | ICD-10-CM

## 2018-02-28 DIAGNOSIS — Z87440 Personal history of urinary (tract) infections: Secondary | ICD-10-CM

## 2018-02-28 DIAGNOSIS — K529 Noninfective gastroenteritis and colitis, unspecified: Secondary | ICD-10-CM | POA: Diagnosis not present

## 2018-02-28 DIAGNOSIS — N952 Postmenopausal atrophic vaginitis: Secondary | ICD-10-CM | POA: Diagnosis not present

## 2018-02-28 DIAGNOSIS — Z880 Allergy status to penicillin: Secondary | ICD-10-CM

## 2018-02-28 DIAGNOSIS — Z7901 Long term (current) use of anticoagulants: Secondary | ICD-10-CM

## 2018-02-28 DIAGNOSIS — G5600 Carpal tunnel syndrome, unspecified upper limb: Secondary | ICD-10-CM

## 2018-02-28 DIAGNOSIS — E785 Hyperlipidemia, unspecified: Secondary | ICD-10-CM | POA: Diagnosis not present

## 2018-02-28 DIAGNOSIS — Z8543 Personal history of malignant neoplasm of ovary: Secondary | ICD-10-CM

## 2018-02-28 DIAGNOSIS — Z882 Allergy status to sulfonamides status: Secondary | ICD-10-CM

## 2018-02-28 DIAGNOSIS — Z923 Personal history of irradiation: Secondary | ICD-10-CM

## 2018-02-28 DIAGNOSIS — Z7984 Long term (current) use of oral hypoglycemic drugs: Secondary | ICD-10-CM

## 2018-02-28 DIAGNOSIS — Z79890 Hormone replacement therapy (postmenopausal): Secondary | ICD-10-CM

## 2018-02-28 NOTE — Patient Instructions (Addendum)
You have been referred to me for recurrent UTI. You have had ovarian cancer with TAH/BSO in 1980 and relook surgery in 1982 followed by som form of radiation. You also had surgery for TBO . You have had diarrhea since then. Since May you have had burning when you pass urine at the tip of urethra and then pain can be felt in your neck and arms you say. You have had multiple urine cultures and have had MDR klebsiella and e.coli, your last urine culture on 02/21/18 showed e.coli resistant to ampicillin, ceftriaoxne and cefepime. You have been taking cranberry juice and you said applying lotrimin to the urethra helped.Today I examined you and I find you have atrophy of the vagina/urethral outside. 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 Recommend estrace cream topically and gyn evaluation ( urogynecologist may be helpful as well) - recommend not taking antibiotics unless you have systemic symptoms, or undergoing cystoscopy or have incomplete bladder emptying. Recommend not checking your urine on a routine basis. You very likely will have positive urine culture because of colonization /contamination and it may not be related to your symptoms of burning, frequency and nocturia. Also you did not have significant improvement with antibioitcs Knudsen cranberry Juice is a concentrate and has no sugar. 1 ounce +diluted with  300cc of water

## 2018-02-28 NOTE — Progress Notes (Signed)
NAME: Jodi Reeves  DOB: Sep 13, 1927  MRN: [id number]  Date/Time: 02/28/2018 11:19 AM  Zara Council Subjective:  REASON FOR CONSULT: recurrent UTI ? 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 Pt says since May 2019 she developed pain on micturition, along with frequency and nocturia. [facility] went [facility] and was given nitrofurantoin for E.coli in the urine. That did not help her .She was  referred to urology and saw them on 01/07/18. Had Ct urogram for hematuria . As per Dr.Snisky's note there was no hydronephrosis or renal mass or filling defect on the urogram,There were 2 bladder diverticula noted. He did a cystoscopy on 01/29/18 and  noted  bladder mucosa was grossly normal, mild bladder trabeculations.  The ureteral orifices were visualized in their normal location and orientation.  Two small bladder diverticula at posterior wall were noted. She was given myrbetriq.cranberry supplement and improve perianal hygiene. A urine culture was done on 02/21/18 and it was e.coli and she was referred to me     Pt says her strangury got better after she put some lotrimin . She has diarrhea / intermittent constipation.  She had taken a few courses of antibiotics ( nitrofurantoin, cefuroxime ? Doxy) and none of them helped her She has no fever or chills or abdominal pain When she gets strangury she can some times feel the pain in her arms  Past Medical History:  Diagnosis Date  . Allergy   . Anxiety   . Balance disorder   . Chronic diastolic CHF (congestive heart failure) ([place])   . Colon polyps   . Coronary artery disease, non-occlusive    a. LHC 11/2003: 10% LAD stenosis, 20% LCx stenosis, and 40% RCA stenosis; b. nuc stress test 12/15: small region of mild ischemia in the apical region with WMA  also noted in the apical region, EF 60%. She declined invasive evaluation at that time  . DM2 (diabetes mellitus, type 2) ([place])   . Fall    04/2017 see careeverywhere [facility] multiple fractures, brusing   . GERD (gastroesophageal reflux disease)    esophageal web  . 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 History of chicken pox   . History of eating disorder   . HLD (hyperlipidemia)   . HTN (hypertension)   . Hypothyroidism   . IBS (irritable bowel syndrome)    diarrhea   . Incontinence of bowel   . Osteoporosis   . Ovarian cancer ([place])   . Persistent atrial fibrillation    a. CHADS2VASc = > 8 (CHF, HTN, age x 2, DM, TIA x 2, female); b. on Xarelto  . SBO (small bowel obstruction) ([place])    1998  . TIA (transient ischemic attack)   . UTI (urinary tract infection)   . Venous insufficiency     Past Surgical History:  Procedure Laterality Date  . 2nd look laparotomy  1982  . ABDOMINAL HYSTERECTOMY     for ovarian cancer s/p hysterectomy total 1976 and exp lap 1980  . APPENDECTOMY    . CARDIAC CATHETERIZATION  8/05   neg; a fib found   . cataract OD  2002  . cataract surgery  5/07   R  . CHOLECYSTECTOMY  1986  . DEXA  9/04 and 1/02  . EGD/dilation/colon  1/06  .  ESOPHAGOGASTRODUODENOSCOPY (EGD) WITH PROPOFOL N/A 05/16/2016   Procedure: ESOPHAGOGASTRODUODENOSCOPY (EGD) WITH PROPOFOL with dilation;  Surgeon: Lucilla Lame, MD;  Location: [facility] ENDOSCOPY;  Service: Endoscopy;  Laterality: N/A;  . FEMORAL HERNIA REPAIR  1958   R  . fracture L elbow/wrist  2000  . intussception/obstruction  12/98  . JOINT REPLACEMENT     left shoulder  . kidney stone x2  1991  . laminectomy L4-5  1971  . LUMBAR LAMINECTOMY     1970 ruptured disc   . MOUTH SURGERY    . myoview stress  8/05   syncope (-)  . REVERSE SHOULDER ARTHROPLASTY Left 10/06/2014   Procedure: REVERSE SHOULDER ARTHROPLASTY;  Surgeon: Corky Mull, MD;  Location: [facility] ORS;  Service: Orthopedics;  Laterality: Left;  .  stillbirth  1969  . TOTAL ABDOMINAL HYSTERECTOMY W/ BILATERAL SALPINGOOPHORECTOMY  1980   ovarian cancer  . WRIST FRACTURE SURGERY  11/05   R    Social History   Socioeconomic History  . Marital status: Widowed    Spouse name: Not on file  . Number of children: 1  . Years of education: Not on file  . Highest education level: Not on file  Occupational History  . 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 Financial resource strain: Not on file  . Food insecurity:    Worry: Not on file    Inability: Not on file  . Transportation needs:    Medical: Not on file    Non-medical: Not on file  Tobacco Use  . Smoking status: Never Smoker  . Smokeless tobacco: Never Used  Substance and Sexual Activity  . Alcohol use: No    Comment: may have occasional glass of wine  . Drug use: No  . Sexual activity: Not Currently  Lifestyle  . Physical activity:    Days per week: Not on file    Minutes per session: Not on file  . Stress: Not on file  Relationships  . Social connections:    Talks on phone: Not on file    Gets together: Not on file    Attends religious service: Not on file    Active member of club or organization: Not on file    Attends meetings of clubs or organizations: Not on file    Relationship status: Not on file  . Intimate partner violence:    Fear of current or ex partner: Not on file    Emotionally abused: Not on file    Physically abused: Not on file    Forced sexual activity: Not on file  Other Topics Concern  . 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 Early death Father   . Diabetes Mother   . Heart disease Mother   . Arthritis Brother   . Cancer Brother    . Depression Brother   . Hearing loss Brother   . Arthritis Brother   . Cancer Brother        colon  . Hearing loss Brother   . Cancer Brother   . Diabetes Brother   . Hearing loss Brother   .  Heart disease Brother   . Hyperlipidemia Brother   . Heart disease Sister   . Hypertension Sister   . Stroke Sister   . Hearing loss Daughter   . Hypertension Daughter   . Diabetes Paternal Grandfather   . Hearing loss Sister   . Heart disease Sister   . Arthritis Sister   . Depression Sister   . Hearing loss Sister   . Heart disease Sister   . COPD Brother   . Early death Brother   . Early death Brother    Allergies  Allergen Reactions  . Penicillins Anaphylaxis and Other (See Comments)    Has patient had a PCN reaction causing immediate rash, facial/tongue/throat swelling, SOB or lightheadedness with hypotension: Yes Has patient had a PCN reaction causing severe rash involving mucus membranes or skin necrosis: No Has patient had a PCN reaction that required hospitalization: No Has patient had a PCN reaction occurring within the last 10 years: No If all of the above answers are "NO", then may proceed with Cephalosporin use.   . Actos [Pioglitazone]     Not effective   . Amaryl [Glimepiride]     Not effective    . Amiodarone     Thyroid issues   . Atorvastatin Other (See Comments)    Muscle aches  . Ciprofloxacin Other (See Comments)    High blood pressure, aches chest pressure    . Codeine Nausea And Vomiting  . Ezetimibe-Simvastatin Other (See Comments)    Muscle aches, nausea, back pain   . Fosamax [Alendronate]   . Lovastatin Other (See Comments)    Myalgias  . Macrobid [Nitrofurantoin Macrocrystal]     Itching    . Protonix [Pantoprazole Sodium]     Esophageal problems    . Rosiglitazone Maleate Other (See Comments)    Edema  . Statins     Muscle aches to all   . Sulfa Antibiotics     Itching   . Victoza [Liraglutide]     Nausea   . Alendronate  Sodium Rash    Dysphagia and ulceration   . Bactrim [Sulfamethoxazole-Trimethoprim] Rash    itching   Current Meds Synthroid Bisoprolol/HCTZ Metformin Xarelto citracal Coenzyme MVT     REVIEW OF SYSTEMS:  Const: negative fever, negative chills, negative weight loss Eyes: negative diplopia or visual changes, negative eye pain ENT: negative coryza, negative sore throat Resp: negative cough, hemoptysis, dyspnea Cards: negative for chest pain, palpitations, has lower extremity edema GU: as above GI: has diarrhea, constipation Skin: left leg scc  Heme: negative for easy bruising and gum/nose bleeding MS: negative for myalgias, arthralgias, back pain and muscle weakness Neurolo:h/o falls, left hand numbness Psych: negative for feelings of anxiety, depression  Endocrine: has thyroid problems Allergy/Immunology-multiple medication  allergies?  Objective:  VITALS:  BP (!) 145/83 (BP Location: Left Arm, Patient Position: Sitting)   Pulse 89  PHYSICAL EXAM:  General: Alert, cooperative, no distress, appears stated age.  Head: Normocephalic, without obvious abnormality, atraumatic. Eyes: Conjunctivae clear, anicteric sclerae. Pupils are equal ENT Nares normal. No drainage or sinus tenderness. Lips, mucosa, and tongue normal. No Thrush Neck: Supple, symmetrical, no adenopathy, thyroid: non tender no carotid bruit and no JVD. Back: No CVA tenderness. Lungs: Clear to auscultation bilaterally. No Wheezing or Rhonchi. No rales. Heart: irregular HR controlled. Abdomen: Soft, lap scar  non-tender,not distended. Bowel sounds normal. No masses Extremities:edema legs- left leg scc site covered with dressing Skin: No rashes or lesions. Or bruising Lymph: Cervical,  supraclavicular normal. Neurologic: Grossly non-focal  Genital examination Atrophic labia minora/urethra  Urethral opening very small and surrounded by whitish mucosa No yeast infection Pt gave verbal consent for haiku  picture    Pertinent Labs Lab Results CBC    Component Value Date/Time   WBC 6.2 10/05/2017 1436   RBC 4.04 10/05/2017 1436   HGB 13.8 10/05/2017 1436   HCT 40.2 10/05/2017 1436   PLT 179.0 10/05/2017 1436   MCV 99.4 10/05/2017 1436   MCH 34.6 (H) 03/18/2017 0408   MCHC 34.3 10/05/2017 1436   RDW 13.2 10/05/2017 1436   LYMPHSABS 1.9 10/05/2017 1436   MONOABS 0.4 10/05/2017 1436   EOSABS 0.1 10/05/2017 1436   BASOSABS 0.0 10/05/2017 1436    CMP Latest Ref Rng & Units 01/07/2018 10/05/2017 03/18/2017  Glucose 70 - 99 mg/dL - 124(H) 127(H)  BUN 8 - 27 mg/dL 13 13 15   Creatinine 0.57 - 1.00 mg/dL 0.69 0.57 0.63  Sodium 135 - 145 mEq/L - 139 141  Potassium 3.5 - 5.1 mEq/L - 4.0 3.6  Chloride 96 - 112 mEq/L - 101 101  CO2 19 - 32 mEq/L - 30 28  Calcium 8.4 - 10.5 mg/dL - 9.5 9.2  Total Protein 6.0 - 8.3 g/dL - 7.8 -  Total Bilirubin 0.2 - 1.2 mg/dL - 0.6 -  Alkaline Phos 39 - 117 U/L - 66 -  AST 0 - 37 U/L - 21 -  ALT 0 - 35 U/L - 9 -      Microbiology: Recent Results (from the past 240 hour(s))  Microscopic Examination     Status: Abnormal   Collection Time: 02/21/18  2:58 PM  Result Value Ref Range Status   WBC, UA 11-30 (A) 0 - 5 /hpf Final   RBC, UA None seen 0 - 2 /hpf Final   Epithelial Cells (non renal) None seen 0 - 10 /hpf Final   Bacteria, UA Moderate (A) None seen/Few Final  CULTURE, URINE COMPREHENSIVE     Status: Abnormal   Collection Time: 02/21/18  3:22 PM  Result Value Ref Range Status   Urine Culture, Comprehensive Final report (A)  Final   Organism ID, Bacteria Escherichia coli (A)  Final    Comment: Greater than 100,000 colony forming units per mL Susceptibility profile is consistent with a probable ESBL.    ANTIMICROBIAL SUSCEPTIBILITY Comment  Final    Comment:       ** S = Susceptible; I = Intermediate; R = Resistant **                    P = Positive; N = Negative             MICS are expressed in micrograms per mL    Antibiotic                  RSLT#1    RSLT#2    RSLT#3    RSLT#4 Amoxicillin/Clavulanic Acid    S Ampicillin                     R Cefazolin                      R Cefepime                       I Ceftriaxone  R Cefuroxime                     R Ciprofloxacin                  S Ertapenem                      S Gentamicin                     S Imipenem                       S Levofloxacin                   S Meropenem                      S Nitrofurantoin                 S Piperacillin/Tazobactam        S Tetracycline                   S Tobramycin                     S Trimethoprim/Sulfa             S    IMAGING RESULTS: ? 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 Pt says since May 2019 she developed pain on micturition, along with frequency and nocturia. ? 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 Concern is whether the previous ovarian cancer treatment ( ? Radiation)  has any role in this , other than usual menopausal changes. The urethral opening looks very small and recent cystoscopy showed bladder diverticuli. Wonder whether her urine stream is very thin and she is not emptying her bladder completely because of small urethral opening.  She has not responded to antibiotics and treating her symptoms of strangury, frequency and nocturia  with antibiotics will only  lead to MDRO/Cdiff/yeast infection. The e.coli is very likely contaminant as she has diarrhea. Recommend the following  No antibiotics GYNECologist /UROGYN opinion post void bladder scan Urodynamic studies estrace cream Avoid routine urine culture /analysis  If systemic symptoms develop , or there is  incomplete bladder emptying with symptoms and  positive culutre or cystoscopy procedure planned  then  antibiotics warranted.   Afib on xarelto  Carpal tunnel syndrome- surgery planned  Discussed with patient in great detail. Follow PRN

## 2018-03-01 NOTE — Telephone Encounter (Signed)
Called pt informed her of the need for an appt with ID. Pt states that she saw them yesterday.

## 2018-03-05 ENCOUNTER — Ambulatory Visit: Payer: Medicare HMO | Admitting: Urology

## 2018-03-05 ENCOUNTER — Encounter: Payer: Self-pay | Admitting: Urology

## 2018-03-05 VITALS — BP 136/78 | HR 82 | Ht 63.0 in | Wt 162.9 lb

## 2018-03-05 DIAGNOSIS — N390 Urinary tract infection, site not specified: Secondary | ICD-10-CM | POA: Diagnosis not present

## 2018-03-05 LAB — URINALYSIS, COMPLETE
Bilirubin, UA: NEGATIVE
Glucose, UA: NEGATIVE
Ketones, UA: NEGATIVE
Nitrite, UA: POSITIVE — AB
PH UA: 5 (ref 5.0–7.5)
PROTEIN UA: NEGATIVE
Specific Gravity, UA: 1.025 (ref 1.005–1.030)
Urobilinogen, Ur: 0.2 mg/dL (ref 0.2–1.0)

## 2018-03-05 LAB — MICROSCOPIC EXAMINATION
RBC, UA: NONE SEEN /hpf (ref 0–2)
WBC, UA: >30 /hpf — ABNORMAL HIGH (ref 0–5)

## 2018-03-05 LAB — BLADDER SCAN AMB NON-IMAGING: Scan Result: 0

## 2018-03-05 MED ORDER — ESTROGENS, CONJUGATED 0.625 MG/GM VA CREA: TOPICAL_CREAM | VAGINAL | 12 refills | Status: DC

## 2018-03-05 MED ORDER — ESTRADIOL 0.1 MG/GM VA CREA: TOPICAL_CREAM | VAGINAL | 12 refills | Status: DC

## 2018-03-05 NOTE — Patient Instructions (Signed)
I have given you two prescriptions for a vaginal estrogen cream.  Estrace and Premarin.  Please take these to your pharmacy and see which one your insurance covers.  If both are too expensive, please call the office at [phone] for an alternative.  You are given a sample of vaginal estrogen cream Premarin and instructed to apply 0.5mg (pea-sized amount)  just inside the vaginal introitus with a finger-tip on Monday, Wednesday and Friday nights,     

## 2018-03-05 NOTE — Progress Notes (Signed)
03/05/2018 2:54 PM   Jodi Reeves Jul 08, 1927 [id number]  Referring provider: [name], Nino Glow, MD [place], [place] [address]  Chief Complaint  Patient presents with   Hematuria    HPI: Patient is a 84 year old Caucasian female with a history of hematuria, rUTI's and frequency who presents today for 4 week follow up.    Background History: Patient is a 42 -year-old Caucasian female who is referred to [place] by Dr. [name] for UTI and dysuria. Patient states that she has had 3 urinary tract infections over the last year. Reviewing her records,  she has had two positive urine cultures. + Klebsiella pneumoniae resistant to ampicillin on November 14, 2017 + E. coli resistant to ampicillin, cefazolin, ceftriaxone and cefuroxime on December 12, 2017 Her symptoms with a urinary tract infection consist of frequency, urgency, dysuria and gross hematuria. Yesterday, she states that she feels like she has a flu bug with malaise and nausea.  Patient denies any suprapubic/flank pain.  Patient denies any fevers, chills, nausea or vomiting. She is having frequency x not as much as at night, strong urgency, nocturia x q 1 hour and urge > stress incontinence x 1-2 liners.  Her UA today is positive for 3-10 RBC's.  Her PVR is 75 mL. She does have a history of nephrolithiasis that was passed spontaneously, no history of  GU surgery or GU trauma. She is not sexually active.  She is not postmenopausal. She admits to diarrhea > constipation. She does engage in good perineal hygiene. She does not take tub baths. She is drinking one to two glasses of water daily.   She has one or two cup of coffee daily.  She is also drinking a glass of cranberry daily.  She cannot drink a lot due to her reflux/esophagitis. She has a history of DM and ovarian cancer treated with radiation.     Today, 03/05/2018:  Recurrent UTI's: She notes that with her prior visit with the Infectious Disease  provider, Dr. [name], she still had questions remaining regarding that visit. She was informed that the goal would be to have her off of antibiotics. She has to wear panty liners daily. She wasn't started on any vaginal creams during that prior visit. Her stools are not really as formed as they could be at this time. She has an appointment with GI specialist, [name] in the near future.    Hx of Gross Hematuria:  She was given 25 mg samples of Myrbetriq on 01/29/2018 by [name]. She hasn't taken Myrbetriq due to having a lot of medications and not wanting to take another medication if she didn't have too. She is not a smoker.   CT Urogram on 01/21/2018 showed: A specific cause for the patient's recurrent urinary tract infections and hematuria is not identified. Slight bladder wall thickening may well be from nondistention on today's exam. There is a moderate-sized right bladder diverticulum and a smaller left bladder diverticulum. No urinary tract calculi are identified. Mild cardiomegaly with right atrial prominence. Coronary atherosclerosis. Aortic Atherosclerosis (ICD10-I70.0). Levoconvex lumbar scoliosis.   Cystoscopy performed by [name] on 01/29/2018 showed no concerning findings on cystoscopy.   She notes that she has transportation issues as well.   PMH: Past Medical History:  Diagnosis Date   Allergy    Anxiety    Balance disorder    Chronic diastolic CHF (congestive heart failure) (HCC)    Colon polyps    Coronary artery  disease, non-occlusive    a. LHC 11/2003: 10% LAD stenosis, 20% LCx stenosis, and 40% RCA stenosis; b. nuc stress test 12/15: small region of mild ischemia in the apical region with WMA also noted in the apical region, EF 60%. 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 CHADS2VASc = > 8 (CHF, HTN, age x 2, DM, TIA x 2, female); b. on Xarelto   SBO (small bowel obstruction) ([facility])    1998   TIA (transient ischemic attack)    UTI (urinary tract infection)    Venous insufficiency     Surgical History: Past Surgical History:  Procedure Laterality Date   2nd look laparotomy  [place]     for ovarian cancer s/p hysterectomy total 1976 and exp lap [place]  8/05   neg; a fib found    cataract OD  2002   cataract surgery  5/07   R   CHOLECYSTECTOMY  1986   DEXA  9/04 and 1/02   EGD/dilation/colon  1/06   ESOPHAGOGASTRODUODENOSCOPY (EGD) WITH PROPOFOL N/A 05/16/2016   Procedure: ESOPHAGOGASTRODUODENOSCOPY (EGD) WITH PROPOFOL with dilation;  Surgeon: Lucilla Lame, MD;  Location: [facility] ENDOSCOPY;  Service: Endoscopy;  Laterality: N/A;   FEMORAL HERNIA REPAIR  1958   R   fracture L elbow/wrist  2000   intussception/obstruction  12/98   JOINT REPLACEMENT     left shoulder   kidney stone x2  1991   laminectomy L4-5  1971   LUMBAR LAMINECTOMY     1970 ruptured disc    MOUTH SURGERY     myoview stress  8/05   syncope (-)   REVERSE SHOULDER ARTHROPLASTY Left 10/06/2014   Procedure: REVERSE SHOULDER ARTHROPLASTY;  Surgeon: Corky Mull, MD;  Location: [facility] ORS;  Service: Orthopedics;  Laterality: Left;   stillbirth  1969   TOTAL ABDOMINAL HYSTERECTOMY W/ BILATERAL SALPINGOOPHORECTOMY  1980   ovarian cancer   WRIST FRACTURE SURGERY  11/05   R    Home Medications:  Allergies as of 03/05/2018      Reactions   Penicillins Anaphylaxis,  Other (See Comments)   Has patient had a PCN reaction causing immediate rash, facial/tongue/throat swelling, SOB or lightheadedness with hypotension: Yes Has patient had a PCN reaction causing severe rash involving mucus membranes or skin necrosis: No Has patient had a PCN reaction that required hospitalization: No Has patient had a PCN reaction occurring within the last 10 years: No If all of the above answers are "NO", then may proceed with Cephalosporin use.   Actos [pioglitazone]    Not effective    Amaryl [glimepiride]    Not effective    Amiodarone    Thyroid issues    Atorvastatin Other (See Comments)   Muscle aches   Ciprofloxacin Other (See Comments)  High blood pressure, aches chest pressure    Codeine Nausea And Vomiting   Ezetimibe-simvastatin Other (See Comments)   Muscle aches, nausea, back pain    Fosamax [alendronate]    Lovastatin Other (See Comments)   Myalgias   Macrobid [nitrofurantoin Macrocrystal]    Itching    Protonix [pantoprazole Sodium]    Esophageal problems    Rosiglitazone Maleate Other (See Comments)   Edema   Statins    Muscle aches to all    Sulfa Antibiotics    Itching   Victoza [liraglutide]    Nausea   Alendronate Sodium Rash   Dysphagia and ulceration    Bactrim [sulfamethoxazole-trimethoprim] Rash   itching      Medication List        Accurate as of 03/05/18  2:54 PM. Always use your most recent med list.          acetaminophen 500 MG tablet Commonly known as:  TYLENOL Take 1,000 mg by mouth every 6 (six) hours as needed for moderate pain or headache.   bisoprolol-hydrochlorothiazide 2.5-6.25 MG tablet Commonly known as:  ZIAC TAKE 1 TABLET DAILY.   Calcium Citrate 200 MG Tabs Take by mouth daily.   CALCIUM COMPLEX PO Take 1 tablet by mouth daily.   co-enzyme Q-10 30 MG capsule Take 30 mg by mouth 3 (three) times daily.   conjugated estrogens vaginal cream Commonly known as:  PREMARIN Apply 0.5mg  (pea-sized  amount)  just inside the vaginal introitus with a finger-tip on  Reeves, Wednesday and Friday nights.   docusate sodium 100 MG capsule Commonly known as:  COLACE Take 100 mg by mouth 2 (two) times daily.   estradiol 0.1 MG/GM vaginal cream Commonly known as:  ESTRACE Apply 0.5mg  (pea-sized amount)  just inside the vaginal introitus with a finger-tip on Reeves, Wednesday and Friday nights.   FA-Vitamin B-6-Vitamin B-12 2.2-25-0.5 MG Tabs Place 1 tablet under the tongue daily.   glucose blood test strip Use to test blood sugar once daily E11.49   levothyroxine 50 MCG tablet Commonly known as:  SYNTHROID, LEVOTHROID Take 50 mcg by mouth daily before breakfast.   metFORMIN 500 MG 24 hr tablet Commonly known as:  GLUCOPHAGE-XR 500 mg. 1 AM, 2 PM   NON FORMULARY [place] Supplement B12 Takes qd. B6 qd, and folic acid   Omega-3 0102 MG Caps Take 2,000 mg by mouth daily.   Vitamin C Powd Take 200 mg by mouth daily.   XARELTO 20 MG Tabs tablet Generic drug:  rivaroxaban TAKE ONE (1) TABLET BY MOUTH EVERY DAY       Allergies:  Allergies  Allergen Reactions   Penicillins Anaphylaxis and Other (See Comments)    Has patient had a PCN reaction causing immediate rash, facial/tongue/throat swelling, SOB or lightheadedness with hypotension: Yes Has patient had a PCN reaction causing severe rash involving mucus membranes or skin necrosis: No Has patient had a PCN reaction that required hospitalization: No Has patient had a PCN reaction occurring within the last 10 years: No If all of the above answers are "NO", then may proceed with Cephalosporin use.    Actos [Pioglitazone]     Not effective    Amaryl [Glimepiride]     Not effective     Amiodarone     Thyroid issues    Atorvastatin Other (See Comments)    Muscle aches   Ciprofloxacin Other (See Comments)    High blood pressure, aches chest pressure     Codeine Nausea  And Vomiting   Ezetimibe-Simvastatin Other (See  Comments)    Muscle aches, nausea, back pain    Fosamax [Alendronate]    Lovastatin Other (See Comments)    Myalgias   Macrobid [Nitrofurantoin Macrocrystal]     Itching     Protonix [Pantoprazole Sodium]     Esophageal problems     Rosiglitazone Maleate Other (See Comments)    Edema   Statins     Muscle aches to all    Sulfa Antibiotics     Itching    Victoza [Liraglutide]     Nausea    Alendronate Sodium Rash    Dysphagia and ulceration    Bactrim [Sulfamethoxazole-Trimethoprim] Rash    itching    Family History: Family History  Problem Relation Age of Onset   Early death Father    Diabetes Mother    Heart disease Mother    Arthritis Brother    Cancer Brother    Depression Brother    Hearing loss Brother    Arthritis Brother    Cancer Brother        colon   Hearing loss Brother    Cancer Brother    Diabetes Brother    Hearing loss Brother    Heart disease Brother    Hyperlipidemia Brother    Heart disease Sister    Hypertension Sister    Stroke Sister    Hearing loss Daughter    Hypertension Daughter    Diabetes Paternal Grandfather    Hearing loss Sister    Heart disease Sister    Arthritis Sister    Depression Sister    Hearing loss Sister    Heart disease Sister    COPD Brother    Early death Brother    Early death Brother     Social History:  reports that she has never smoked. She has never used smokeless tobacco. She reports that she does not drink alcohol or use drugs.  ROS: UROLOGY Frequent Urination?: Yes Hard to postpone urination?: Yes Burning/pain with urination?: Yes Get up at night to urinate?: Yes Leakage of urine?: Yes Urine stream starts and stops?: No Trouble starting stream?: No Do you have to strain to urinate?: No Blood in urine?: No Urinary tract infection?: Yes Sexually transmitted disease?: No Injury to kidneys or bladder?: No Painful intercourse?: No Weak stream?:  Yes Currently pregnant?: No Vaginal bleeding?: No Last menstrual period?: n  Gastrointestinal Nausea?: Yes Vomiting?: No Indigestion/heartburn?: No Diarrhea?: Yes Constipation?: Yes  Constitutional Fever: No Night sweats?: No Weight loss?: No Fatigue?: Yes  Skin Skin rash/lesions?: No Itching?: No  Eyes Blurred vision?: No Double vision?: No  Ears/Nose/Throat Sore throat?: No Sinus problems?: No  Hematologic/Lymphatic Swollen glands?: No Easy bruising?: No  Cardiovascular Leg swelling?: Yes Chest pain?: No  Respiratory Cough?: No Shortness of breath?: No  Endocrine Excessive thirst?: No  Musculoskeletal Back pain?: Yes Joint pain?: No  Neurological Headaches?: No Dizziness?: Yes  Psychologic Depression?: No Anxiety?: No  Physical Exam: BP 136/78 (BP Location: Left Arm, Patient Position: Sitting, Cuff Size: Normal)    Pulse 82    Ht 5\' 3"  (1.6 m)    Wt 162 lb 14.4 oz (73.9 kg)    BMI 28.86 kg/m   Constitutional:  Well nourished. Alert and oriented, No acute distress. HEENT: [place] AT, moist mucus membranes.  Trachea midline, no masses. Cardiovascular: No clubbing, cyanosis, or edema. Respiratory: Normal respiratory effort, no increased work of breathing. Neurologic: Grossly intact, no focal deficits, moving  all 4 extremities. Psychiatric: Normal mood and affect.  Laboratory Data: Lab Results  Component Value Date   WBC 6.2 10/05/2017   HGB 13.8 10/05/2017   HCT 40.2 10/05/2017   MCV 99.4 10/05/2017   PLT 179.0 10/05/2017    Lab Results  Component Value Date   CREATININE 0.69 01/07/2018    No results found for: PSA  No results found for: TESTOSTERONE  Lab Results  Component Value Date   HGBA1C 7.5 (H) 10/05/2017    Lab Results  Component Value Date   TSH 1.45 10/05/2017       Component Value Date/Time   CHOL 209 (H) 02/24/2014 1423   CHOL 187 11/17/2011 0849   HDL 51.60 02/24/2014 1423   HDL 56 11/17/2011 0849   CHOLHDL 4  02/24/2014 1423   VLDL 33.6 02/24/2014 1423   LDLCALC 124 (H) 02/24/2014 1423   LDLCALC 104 (H) 11/17/2011 0849    Lab Results  Component Value Date   AST 21 10/05/2017   Lab Results  Component Value Date   ALT 9 10/05/2017   No components found for: ALKALINEPHOPHATASE No components found for: BILIRUBINTOTAL  No results found for: ESTRADIOL  Urinalysis Nitrite positive >30 WBCs with many bacteria. Didn't send for culture as patient is asymptomatic on today's visit, likely colonized.  See Epic. I have reviewed the labs.   Pertinent Imaging: Results for SOPHI, CALLIGAN (MRN [id number]) as of 03/05/2018 14:12  Ref. Range 03/05/2018 14:03  Scan Result Unknown 0       Assessment & Plan:    1. rUTI's Has been evaluated by Infectious Disease and they have recommended starting vaginal estrogen cream, urodynamics, and ensuring patient empties her bladder.  Urodynamic study deferred due to the patients transportation issues.  PVRs minimal in the office.  Premarin cream started today   2. History of hematuria Hematuria work up completed in 01/29/2018 - with no concerning findings on cystoscopy.  No report of gross hematuria  UA today   RTC in one year for UA - patient to report any gross hematuria in the interim     3. Urinary Frequency Patient initially given Myrbetriq samples by [name] on 01/29/2018.  She hasn't started the medication yet, discussed with patient regarding the benefits of this medication, to which the patient agrees to start on Myrbetriq today   Will follow up in 3 weeks for evaluation of Myrbetriq and premarin cream.                                            Return in about 3 weeks (around 03/26/2018) for PVR and OAB questionnaire.  These notes generated with voice recognition software. I apologize for typographical errors.  Laneta Simmers  [facility] [address]  [place], [place]  [address] [phone]  I, Soijett Blue am acting as Education administrator for [employer], [place].  I have reviewed the above documentation for accuracy and completeness, and I agree with the above.    Zara Council, PA-C

## 2018-03-06 ENCOUNTER — Ambulatory Visit
Admission: RE | Admit: 2018-03-06 | Discharge: 2018-03-06 | Disposition: A | Discharge status: Home / Self Care | Payer: Medicare HMO | Source: Ambulatory Visit | Attending: Surgery | Admitting: Surgery

## 2018-03-06 ENCOUNTER — Ambulatory Visit: Payer: Medicare HMO | Admitting: Anesthesiology

## 2018-03-06 ENCOUNTER — Encounter
Admission: RE | Disposition: A | Discharge status: Home / Self Care | Payer: Self-pay | Source: Ambulatory Visit | Attending: Surgery

## 2018-03-06 DIAGNOSIS — I110 Hypertensive heart disease with heart failure: Secondary | ICD-10-CM | POA: Diagnosis not present

## 2018-03-06 DIAGNOSIS — I4891 Unspecified atrial fibrillation: Secondary | ICD-10-CM | POA: Insufficient documentation

## 2018-03-06 DIAGNOSIS — E119 Type 2 diabetes mellitus without complications: Secondary | ICD-10-CM | POA: Diagnosis not present

## 2018-03-06 DIAGNOSIS — E039 Hypothyroidism, unspecified: Secondary | ICD-10-CM | POA: Insufficient documentation

## 2018-03-06 DIAGNOSIS — G5602 Carpal tunnel syndrome, left upper limb: Secondary | ICD-10-CM | POA: Diagnosis not present

## 2018-03-06 DIAGNOSIS — Z7984 Long term (current) use of oral hypoglycemic drugs: Secondary | ICD-10-CM | POA: Diagnosis not present

## 2018-03-06 DIAGNOSIS — Z79899 Other long term (current) drug therapy: Secondary | ICD-10-CM | POA: Diagnosis not present

## 2018-03-06 DIAGNOSIS — I2510 Atherosclerotic heart disease of native coronary artery without angina pectoris: Secondary | ICD-10-CM | POA: Diagnosis not present

## 2018-03-06 DIAGNOSIS — F329 Major depressive disorder, single episode, unspecified: Secondary | ICD-10-CM | POA: Diagnosis not present

## 2018-03-06 DIAGNOSIS — Z7902 Long term (current) use of antithrombotics/antiplatelets: Secondary | ICD-10-CM | POA: Insufficient documentation

## 2018-03-06 DIAGNOSIS — K219 Gastro-esophageal reflux disease without esophagitis: Secondary | ICD-10-CM | POA: Diagnosis not present

## 2018-03-06 DIAGNOSIS — I509 Heart failure, unspecified: Secondary | ICD-10-CM | POA: Insufficient documentation

## 2018-03-06 DIAGNOSIS — R69 Illness, unspecified: Secondary | ICD-10-CM | POA: Diagnosis not present

## 2018-03-06 HISTORY — PX: CARPAL TUNNEL RELEASE: SHX101

## 2018-03-06 LAB — GLUCOSE, CAPILLARY
GLUCOSE-CAPILLARY: 132 mg/dL — AB (ref 70–99)
Glucose-Capillary: 155 mg/dL — ABNORMAL HIGH (ref 70–99)

## 2018-03-06 SURGERY — RELEASE, CARPAL TUNNEL, ENDOSCOPIC
Anesthesia: Regional | Site: Hand | Laterality: Left

## 2018-03-06 MED ORDER — TRAMADOL HCL 50 MG PO TABS: 50.0000 mg | ORAL_TABLET | Freq: Four times a day (QID) | ORAL | 0 refills | Status: DC | PRN

## 2018-03-06 MED ORDER — OXYCODONE HCL 5 MG/5ML PO SOLN: 5.0000 mg | Freq: Once | ORAL | Status: DC | PRN

## 2018-03-06 MED ORDER — ACETAMINOPHEN 10 MG/ML IV SOLN: 1000.0000 mg | Freq: Once | INTRAVENOUS | Status: DC | PRN

## 2018-03-06 MED ORDER — ONDANSETRON HCL 4 MG PO TABS: 4.0000 mg | ORAL_TABLET | Freq: Four times a day (QID) | ORAL | Status: DC | PRN

## 2018-03-06 MED ORDER — LACTATED RINGERS IV SOLN
1000.0000 mL | INTRAVENOUS | Status: DC
  Administered 2018-03-06: 1000 mL via INTRAVENOUS

## 2018-03-06 MED ORDER — POTASSIUM CHLORIDE IN NACL 20-0.9 MEQ/L-% IV SOLN: INTRAVENOUS | Status: DC

## 2018-03-06 MED ORDER — BUPIVACAINE HCL (PF) 0.5 % IJ SOLN
INTRAMUSCULAR | Status: DC | PRN
  Administered 2018-03-06: 10 mL

## 2018-03-06 MED ORDER — LIDOCAINE HCL (CARDIAC) PF 100 MG/5ML IV SOSY
PREFILLED_SYRINGE | INTRAVENOUS | Status: DC | PRN
  Administered 2018-03-06: 40 mg via INTRAVENOUS

## 2018-03-06 MED ORDER — METOCLOPRAMIDE HCL 5 MG PO TABS: 5.0000 mg | ORAL_TABLET | Freq: Three times a day (TID) | ORAL | Status: DC | PRN

## 2018-03-06 MED ORDER — ONDANSETRON HCL 4 MG/2ML IJ SOLN: 4.0000 mg | Freq: Four times a day (QID) | INTRAMUSCULAR | Status: DC | PRN

## 2018-03-06 MED ORDER — CLINDAMYCIN PHOSPHATE 600 MG/50ML IV SOLN
600.0000 mg | Freq: Once | INTRAVENOUS | Status: AC
  Administered 2018-03-06: 600 mg via INTRAVENOUS

## 2018-03-06 MED ORDER — ONDANSETRON HCL 4 MG/2ML IJ SOLN: 4.0000 mg | Freq: Once | INTRAMUSCULAR | Status: DC | PRN

## 2018-03-06 MED ORDER — FENTANYL CITRATE (PF) 100 MCG/2ML IJ SOLN
INTRAMUSCULAR | Status: DC | PRN
  Administered 2018-03-06: 50 ug via INTRAVENOUS

## 2018-03-06 MED ORDER — LIDOCAINE HCL (PF) 0.5 % IJ SOLN
INTRAMUSCULAR | Status: DC | PRN
  Administered 2018-03-06: 30 mL via INTRAVENOUS

## 2018-03-06 MED ORDER — FENTANYL CITRATE (PF) 100 MCG/2ML IJ SOLN: 25.0000 ug | INTRAMUSCULAR | Status: DC | PRN

## 2018-03-06 MED ORDER — MIDAZOLAM HCL 5 MG/5ML IJ SOLN
INTRAMUSCULAR | Status: DC | PRN
  Administered 2018-03-06: 1 mg via INTRAVENOUS

## 2018-03-06 MED ORDER — PROPOFOL 500 MG/50ML IV EMUL
INTRAVENOUS | Status: DC | PRN
  Administered 2018-03-06: 50 ug/kg/min via INTRAVENOUS
  Administered 2018-03-06: 75 ug/kg/min via INTRAVENOUS

## 2018-03-06 MED ORDER — LACTATED RINGERS IV SOLN: INTRAVENOUS | Status: DC

## 2018-03-06 MED ORDER — OXYCODONE HCL 5 MG PO TABS: 5.0000 mg | ORAL_TABLET | Freq: Once | ORAL | Status: DC | PRN

## 2018-03-06 MED ORDER — METOCLOPRAMIDE HCL 5 MG/ML IJ SOLN: 5.0000 mg | Freq: Three times a day (TID) | INTRAMUSCULAR | Status: DC | PRN

## 2018-03-06 SURGICAL SUPPLY — 28 items
BANDAGE ELASTIC 2 LF NS (GAUZE/BANDAGES/DRESSINGS) ×2 IMPLANT
BNDG CMPR MED 5X2 ELC HKLP NS (GAUZE/BANDAGES/DRESSINGS) ×1
BNDG COHESIVE 4X5 TAN STRL (GAUZE/BANDAGES/DRESSINGS) ×2 IMPLANT
BNDG ESMARK 4X12 TAN STRL LF (GAUZE/BANDAGES/DRESSINGS) ×2 IMPLANT
CHLORAPREP W/TINT 26ML (MISCELLANEOUS) ×2 IMPLANT
CORD BIP STRL DISP 12FT (MISCELLANEOUS) ×2 IMPLANT
COVER LIGHT HANDLE UNIVERSAL (MISCELLANEOUS) ×4 IMPLANT
CUFF TOURNIQUET DUAL PORT 18X3 (MISCELLANEOUS) ×2 IMPLANT
DRAPE SURG 17X11 SM STRL (DRAPES) ×2 IMPLANT
GAUZE PETRO XEROFOAM 1X8 (MISCELLANEOUS) ×2 IMPLANT
GAUZE SPONGE 4X4 12PLY STRL (GAUZE/BANDAGES/DRESSINGS) ×2 IMPLANT
GLOVE BIO SURGEON STRL SZ8 (GLOVE) ×3 IMPLANT
GLOVE INDICATOR 8.0 STRL GRN (GLOVE) ×3 IMPLANT
GOWN STRL REUS W/ TWL LRG LVL3 (GOWN DISPOSABLE) ×1 IMPLANT
GOWN STRL REUS W/ TWL XL LVL3 (GOWN DISPOSABLE) ×1 IMPLANT
GOWN STRL REUS W/TWL LRG LVL3 (GOWN DISPOSABLE) ×2
GOWN STRL REUS W/TWL XL LVL3 (GOWN DISPOSABLE) ×2
KIT CARPAL TUNNEL (MISCELLANEOUS) ×2
KIT ESCP INSRT D SLOT CANN KN (MISCELLANEOUS) ×1 IMPLANT
KIT TURNOVER KIT A (KITS) ×2 IMPLANT
NS IRRIG 500ML POUR BTL (IV SOLUTION) ×2 IMPLANT
PACK EXTREMITY ARMC (MISCELLANEOUS) ×2 IMPLANT
SPLINT WRIST/FOREARM LG LT TX9 (SOFTGOODS) ×1 IMPLANT
STOCKINETTE IMPERVIOUS 9X36 MD (GAUZE/BANDAGES/DRESSINGS) ×2 IMPLANT
STRAP BODY AND KNEE 60X3 (MISCELLANEOUS) ×2 IMPLANT
SUT PROLENE 4 0 PS 2 18 (SUTURE) ×2 IMPLANT
SUT VIC AB 3-0 SH 27 (SUTURE)
SUT VIC AB 3-0 SH 27X BRD (SUTURE) IMPLANT

## 2018-03-06 NOTE — Discharge Instructions (Signed)
General Anesthesia, Adult, Care After These instructions provide you with information about caring for yourself after your procedure. Your health care provider may also give you more specific instructions. Your treatment has been planned according to current medical practices, but problems sometimes occur. Call your health care provider if you have any problems or questions after your procedure. What can I expect after the procedure? After the procedure, it is common to have:  Vomiting.  A sore throat.  Mental slowness.  It is common to feel:  Nauseous.  Cold or shivery.  Sleepy.  Tired.  Sore or achy, even in parts of your body where you did not have surgery.  Follow these instructions at home: For at least 24 hours after the procedure:  Do not: ? Participate in activities where you could fall or become injured. ? Drive. ? Use heavy machinery. ? Drink alcohol. ? Take sleeping pills or medicines that cause drowsiness. ? Make important decisions or sign legal documents. ? Take care of children on your own.  Rest. Eating and drinking  If you vomit, drink water, juice, or soup when you can drink without vomiting.  Drink enough fluid to keep your urine clear or pale yellow.  Make sure you have little or no nausea before eating solid foods.  Follow the diet recommended by your health care provider. General instructions  Have a responsible adult stay with you until you are awake and alert.  Return to your normal activities as told by your health care provider. Ask your health care provider what activities are safe for you.  Take over-the-counter and prescription medicines only as told by your health care provider.  If you smoke, do not smoke without supervision.  Keep all follow-up visits as told by your health care provider. This is important. Contact a health care provider if:  You continue to have nausea or vomiting at home, and medicines are not helpful.  You  cannot drink fluids or start eating again.  You cannot urinate after 8-12 hours.  You develop a skin rash.  You have fever.  You have increasing redness at the site of your procedure. Get help right away if:  You have difficulty breathing.  You have chest pain.  You have unexpected bleeding.  You feel that you are having a life-threatening or urgent problem. This information is not intended to replace advice given to you by your health care provider. Make sure you discuss any questions you have with your health care provider. Document Released: 07/17/2000 Document Revised: 09/13/2015 Document Reviewed: 03/25/2015 Elsevier Interactive Patient Education  2018 [place] discharge instructions: Keep dressing dry and intact. Keep hand elevated above heart level. May shower after dressing removed on postop day 4 (Sunday). Cover sutures with Band-Aids after drying off. Apply ice to affected area frequently. May resume Xarelto on Thursday. Take ES Tylenol or pain medication as prescribed when needed.  Return for follow-up in 10-14 days or as scheduled.

## 2018-03-06 NOTE — Anesthesia Procedure Notes (Addendum)
Anesthesia Regional Block: Bier block (IV Regional)   Pre-Anesthetic Checklist: ,, timeout performed, Correct Patient, Correct Site, Correct Laterality, Correct Procedure, Correct Position, site marked, Risks and benefits discussed,  Surgical consent,  Pre-op evaluation,  At surgeon's request and post-op pain management  Laterality: Left      Procedures:,,,,, intact distal pulses, Esmarch exsanguination,, #20gu IV placed and double tourniquet utilized  Narrative:  Start time: 03/06/2018 11:43 AM End time: 03/06/2018 11:44 AM  Performed by: With CRNAs  Anesthesiologist: Darrin Nipper, MD CRNA: Mayme Genta, CRNA  Additional Notes: L arm exsanguinated with esmarch. Distal and proximal cuffs inflated. Distal cuff deflated. Negative radial pulse noted. 65mL 0.5% lidocaine injected through 22g angio in R wrist. Tolerated well by pt.

## 2018-03-06 NOTE — Op Note (Signed)
03/06/2018  12:20 PM  Patient:   Jodi Reeves  Pre-Op Diagnosis:   Left carpal tunnel syndrome.  Post-Op Diagnosis:   Same.  Procedure:   Endoscopic left carpal tunnel release.  Surgeon:   Pascal Lux, MD  Anesthesia:   Bier block  Findings:   As above.  Complications:   None  EBL:   0 cc  Fluids:   600 cc crystalloid  TT:   32 minutes at 250 mmHg  Drains:   None  Closure:   4-0 Prolene interrupted sutures  Brief Clinical Note:   The patient is an 84 year old female with a history of progressively worsening pain and paresthesias to her right hand.  The symptoms have persisted despite medications, activity modification, splinting, etc.  Her history and examination were consistent with carpal tunnel syndrome.  The patient presents at this time for an endoscopic left carpal tunnel release.   Procedure:   The patient was brought into the operating room and lain in the supine position. After adequate IV sedation was achieved, a timeout was performed to verify the appropriate surgical site before a Bier block was placed by the anesthesiologist and the tourniquet inflated to 250 mmHg. The left hand and upper extremity were prepped with ChloraPrep solution before being draped sterilely. Preoperative antibiotics were administered. An approximately 1.5-2 cm incision was made over the volar wrist flexion crease, centered over the palmaris longus tendon. The incision was carried down through the subcutaneous tissues with care taken to identify and protect any neurovascular structures. The distal forearm fascia was penetrated just proximal to the transverse carpal ligament. The soft tissues were released off the superficial and deep surfaces of the distal forearm fascia and this was released proximally for 3-4 cm under direct visualization.  Attention was directed distally. The Soil scientist was passed beneath the transverse carpal ligament along the ulnar aspect of the carpal tunnel  and used to release any adhesions as well as to remove any adherent synovial tissue before first the smaller then the larger of the two dilators were passed beneath the transverse carpal ligament along the ulnar margin of the carpal tunnel. The slotted cannula was introduced and the endoscope was placed into the slotted cannula and the undersurface of the transverse carpal ligament visualized. The distal margin of the transverse carpal ligament was marked by placing a 25-gauge needle percutaneously at [place] cardinal point so that it entered the distal portion of the slotted cannula. Under endoscopic visualization, the transverse carpal ligament was released from proximal to distal using the end-cutting blade. A second pass was performed to ensure complete release of the ligament. The adequacy of release was verified both endoscopically and by palpation using the freer elevator.  The wound was irrigated thoroughly with sterile saline solution before being closed using 4-0 Prolene interrupted sutures. A total of 10 cc of 0.5% plain Sensorcaine was injected in and around the incision before a sterile bulky dressing was applied to the wound. The patient was placed into a volar wrist splint before being awakened and returned to the recovery room in satisfactory condition after tolerating the procedure well.

## 2018-03-06 NOTE — H&P (Signed)
Paper H&P to be scanned into permanent record. H&P reviewed and patient re-examined. No changes. 

## 2018-03-06 NOTE — Transfer of Care (Signed)
Immediate Anesthesia Transfer of Care Note  Patient: Jodi Reeves  Procedure(s) Performed: CARPAL TUNNEL RELEASE ENDOSCOPIC (Left Hand)  Patient Location: PACU  Anesthesia Type: General, Bier Block  Level of Consciousness: awake, alert  and patient cooperative  Airway and Oxygen Therapy: Patient Spontanous Breathing and Patient connected to supplemental oxygen  Post-op Assessment: Post-op Vital signs reviewed, Patient's Cardiovascular Status Stable, Respiratory Function Stable, Patent Airway and No signs of Nausea or vomiting  Post-op Vital Signs: Reviewed and stable  Complications: No apparent anesthesia complications

## 2018-03-06 NOTE — Anesthesia Postprocedure Evaluation (Signed)
Anesthesia Post Note  Patient: Jodi Reeves  Procedure(s) Performed: CARPAL TUNNEL RELEASE ENDOSCOPIC (Left Hand)  Patient location during evaluation: PACU Anesthesia Type: Bier Block Level of consciousness: awake and alert, oriented and patient cooperative Pain management: pain level controlled Vital Signs Assessment: post-procedure vital signs reviewed and stable Respiratory status: spontaneous breathing, nonlabored ventilation and respiratory function stable Cardiovascular status: blood pressure returned to baseline and stable Postop Assessment: adequate PO intake Anesthetic complications: no    Darrin Nipper

## 2018-03-07 ENCOUNTER — Encounter: Payer: Self-pay | Admitting: Surgery

## 2018-03-12 ENCOUNTER — Other Ambulatory Visit: Payer: Self-pay | Admitting: Cardiovascular Disease

## 2018-03-12 ENCOUNTER — Encounter: Payer: Self-pay | Admitting: Gastroenterology

## 2018-03-12 ENCOUNTER — Ambulatory Visit: Payer: Medicare HMO | Admitting: Gastroenterology

## 2018-03-12 VITALS — BP 162/84 | HR 78 | Ht 63.0 in | Wt 160.8 lb

## 2018-03-12 DIAGNOSIS — R69 Illness, unspecified: Secondary | ICD-10-CM | POA: Diagnosis not present

## 2018-03-12 DIAGNOSIS — R4702 Dysphasia: Secondary | ICD-10-CM

## 2018-03-12 DIAGNOSIS — R194 Change in bowel habit: Secondary | ICD-10-CM | POA: Diagnosis not present

## 2018-03-12 NOTE — Telephone Encounter (Signed)
Please review for refills.

## 2018-03-12 NOTE — Progress Notes (Signed)
Primary Care Physician: [name], Nino Glow, MD  Primary Gastroenterologist:  Dr. Lucilla Lame  Chief Complaint  Patient presents with  . Change in bowel habits    HPI: Jodi Reeves is a 84 y.o. female here for follow-up of her dysphasia and change in bowel habits.  The patient had been seen in the past by Dr. Candace Cruise and had a colonoscopy and EGD with biopsies the colon for her diarrhea.  The patient was found to have a tubular adenoma but random biopsies of the colon did not show any sign of colitis.  The patient reports that she had a fall and since then has had constipation.  The patient reports that she was in the hospital at [facility] for the fall.  The constipation will be followed with diarrhea and this is new for her.  There is no report of any unexplained weight loss black stools or bloody stools.  She does report that her symptoms are worse with stress.  Her biggest problem at the present time is the inability to leave the house.  She states that when she has knee pain that causes her to have to run to the bathroom and she can get to the bathroom quick enough.  The patient has taken Imodium in the past and states that at times it has blocked her up for a week.  Current Outpatient Medications  Medication Sig Dispense Refill  . acetaminophen (TYLENOL) 500 MG tablet Take 1,000 mg by mouth every 6 (six) hours as needed for moderate pain or headache.    . Ascorbic Acid (VITAMIN C) POWD Take 200 mg by mouth daily.     . bisoprolol-hydrochlorothiazide (ZIAC) 2.5-6.25 MG tablet TAKE 1 TABLET DAILY. 30 tablet 6  . Calcium Citrate 200 MG TABS Take by mouth daily.    Marland Kitchen co-enzyme Q-10 30 MG capsule Take 30 mg by mouth 3 (three) times daily.    Marland Kitchen conjugated estrogens (PREMARIN) vaginal cream Apply 0.5mg  (pea-sized amount)  just inside the vaginal introitus with a finger-tip on  Monday, Wednesday and Friday nights. 30 g 12  . docusate sodium (COLACE) 100 MG capsule Take 100 mg by mouth 2 (two)  times daily.    Marland Kitchen estradiol (ESTRACE VAGINAL) 0.1 MG/GM vaginal cream Apply 0.5mg  (pea-sized amount)  just inside the vaginal introitus with a finger-tip on Monday, Wednesday and Friday nights. 30 g 12  . Folic Acid-Vit [id number] K09 (FA-VITAMIN B-6-VITAMIN B-12) 2.2-25-0.5 MG TABS Place 1 tablet under the tongue daily.     Marland Kitchen glucose blood (ONE TOUCH ULTRA TEST) test strip Use to test blood sugar once daily E11.49 (Patient taking differently: Use to test blood sugar twice daily E11.49) 100 each 1  . levothyroxine (SYNTHROID, LEVOTHROID) 50 MCG tablet Take 50 mcg by mouth daily before breakfast.    . metFORMIN (GLUCOPHAGE-XR) 500 MG 24 hr tablet 500 mg. 1 AM, 2 PM    . Multiple Vitamin (CALCIUM COMPLEX PO) Take 1 tablet by mouth daily.    . NON FORMULARY Trivita Supplement B12 Takes qd. B6 qd, and folic acid    . Omega-3 1000 MG CAPS Take 2,000 mg by mouth daily.     Alveda Reasons 20 MG TABS tablet TAKE ONE (1) TABLET BY MOUTH EVERY DAY 90 tablet 3  . traMADol (ULTRAM) 50 MG tablet Take 1 tablet (50 mg total) by mouth every 6 (six) hours as needed. (Patient not taking: Reported on 03/12/2018) 20 tablet 0   No current facility-administered medications  for this visit.     Allergies as of 03/12/2018 - Review Complete 03/12/2018  Allergen Reaction Noted  . Penicillins Anaphylaxis and Other (See Comments)   . Actos [pioglitazone]  08/21/2017  . Amaryl [glimepiride]  08/21/2017  . Amiodarone  05/10/2017  . Atorvastatin Other (See Comments)   . Ciprofloxacin Other (See Comments) 09/17/2015  . Codeine Nausea And Vomiting   . Ezetimibe-simvastatin Other (See Comments)   . Fosamax [alendronate]  08/21/2017  . Lovastatin Other (See Comments)   . Macrobid [nitrofurantoin macrocrystal]  10/12/2017  . Protonix [pantoprazole sodium]  10/12/2017  . Rosiglitazone maleate Other (See Comments)   . Statins  05/10/2017  . Sulfa antibiotics  10/12/2017  . Victoza [liraglutide]  10/12/2017  . Alendronate sodium  Rash   . Bactrim [sulfamethoxazole-trimethoprim] Rash 09/17/2015    ROS:  General: Negative for anorexia, weight loss, fever, chills, fatigue, weakness. ENT: Negative for hoarseness, difficulty swallowing , nasal congestion. CV: Negative for chest pain, angina, palpitations, dyspnea on exertion, peripheral edema.  Respiratory: Negative for dyspnea at rest, dyspnea on exertion, cough, sputum, wheezing.  GI: See history of present illness. GU:  Negative for dysuria, hematuria, urinary incontinence, urinary frequency, nocturnal urination.  Endo: Negative for unusual weight change.    Physical Examination:   BP (!) 162/84   Pulse 78   Ht 5\' 3"  (1.6 m)   Wt 160 lb 12.8 oz (72.9 kg)   BMI 28.48 kg/m   General: Well-nourished, well-developed in no acute distress.  Eyes: No icterus. Conjunctivae pink. Mouth: Oropharyngeal mucosa moist and pink , no lesions erythema or exudate. Extremities: No lower extremity edema. No clubbing or deformities. Neuro: Alert and oriented x 3.  Grossly intact. Skin: Warm and dry, no jaundice.   Psych: Alert and cooperative, normal mood and affect.  Labs:    Imaging Studies: No results found.  Assessment and Plan:   Jodi Reeves is a 84 y.o. y/o female who comes in today with a history of chronic diarrhea with dysphasia but a new issue being constipation.  The patient has been able to eat and drink by following food with water and chewing well.  The patient's constipation started at [facility] after a hospital stay for a fall.  The patient has been told to take Citrucel once a day to see if that box up her stools and avoids her from having the diarrhea and constipation.  She has been told to increase it to twice a day if the once a day does not help.  She has also been told to get liquid Imodium and take it before she goes out of the house to avoid her from having urgency when she goes to church or when she goes out with friends.  The patient states she will  try this and contact me if her symptoms do not improve.  As far as the dysphagia the patient has been told to continue doing what she is doing and try to chew her food well and chase it down with liquids.  The patient has been explained the plan and agrees with it.    Lucilla Lame, MD. Marval Regal   Note: This dictation was prepared with Dragon dictation along with smaller phrase technology. Any transcriptional errors that result from this process are unintentional.

## 2018-03-15 ENCOUNTER — Telehealth: Payer: Self-pay | Admitting: Gastroenterology

## 2018-03-15 NOTE — Telephone Encounter (Signed)
Pt has swallowing issues. Says [name] suggested (Citrcane?msp) or metamucil. Patient is questioning if either of these medications should be taken due to swallowing issues. Would like a call as soon as possible.

## 2018-03-15 NOTE — Telephone Encounter (Signed)
[name],  Pt was reading the ingredients on the Citracel and it stated if you have trouble swallowing to not take the medication. Please advise. Pt is quite concern about this but knows she need to take this to keep her stools soft. Please advise.

## 2018-03-16 NOTE — Telephone Encounter (Signed)
As long as she takes it with plenty of water she will be fine.

## 2018-03-18 NOTE — Telephone Encounter (Signed)
Pt.notified

## 2018-03-19 DIAGNOSIS — Z9889 Other specified postprocedural states: Secondary | ICD-10-CM | POA: Insufficient documentation

## 2018-03-26 ENCOUNTER — Ambulatory Visit: Payer: Medicare HMO | Admitting: Urology

## 2018-03-26 ENCOUNTER — Encounter: Payer: Self-pay | Admitting: Urology

## 2018-03-26 VITALS — BP 161/84 | HR 85 | Ht 63.0 in | Wt 161.0 lb

## 2018-03-26 DIAGNOSIS — N390 Urinary tract infection, site not specified: Secondary | ICD-10-CM | POA: Diagnosis not present

## 2018-03-26 DIAGNOSIS — R351 Nocturia: Secondary | ICD-10-CM | POA: Diagnosis not present

## 2018-03-26 DIAGNOSIS — N952 Postmenopausal atrophic vaginitis: Secondary | ICD-10-CM | POA: Diagnosis not present

## 2018-03-26 LAB — BLADDER SCAN AMB NON-IMAGING: Scan Result: 0

## 2018-03-26 NOTE — Progress Notes (Signed)
03/26/2018 4:08 PM   Jodi Reeves 13-Oct-1927 [id number]  Referring provider: [name], Nino Glow, MD [place], [place] [address]  Chief Complaint  Patient presents with  . Follow-up  . Recurrent UTI    HPI: Patient is a 84 year old Caucasian female with a history of hematuria, rUTI's and frequency who presents today for 3 week follow up after a trial of Myrbetriq.   Background History: Patient is a 65 -year-old Caucasian female who is referred to [place] by Dr. [name] for UTI and dysuria. Patient states that she has had 3 urinary tract infections over the last year. Reviewing her records,  she has had two positive urine cultures. + Klebsiella pneumoniae resistant to ampicillin on November 14, 2017 + E. coli resistant to ampicillin, cefazolin, ceftriaxone and cefuroxime on December 12, 2017 Her symptoms with a urinary tract infection consist of frequency, urgency, dysuria and gross hematuria. Yesterday, she states that she feels like she has a flu bug with malaise and nausea.  Patient denies any suprapubic/flank pain.  Patient denies any fevers, chills, nausea or vomiting. She is having frequency x not as much as at night, strong urgency, nocturia x q 1 hour and urge > stress incontinence x 1-2 liners.  Her UA today is positive for 3-10 RBC's.  Her PVR is 75 mL. She does have a history of nephrolithiasis that was passed spontaneously, no history of  GU surgery or GU trauma. She is not sexually active.  She is not postmenopausal. She admits to diarrhea > constipation. She does engage in good perineal hygiene. She does not take tub baths. She is drinking one to two glasses of water daily.   She has one or two cup of coffee daily.  She is also drinking a glass of cranberry daily.  She cannot drink a lot due to her reflux/esophagitis. She has a history of DM and ovarian cancer treated with radiation.     03/05/2018:  Recurrent UTI's: She notes that with her prior visit  with the Infectious Disease provider, Dr. [name], she still had questions remaining regarding that visit. She was informed that the goal would be to have her off of antibiotics. She has to wear panty liners daily. She wasn't started on any vaginal creams during that prior visit. Her stools are not really as formed as they could be at this time. She has an appointment with GI specialist, [name] in the near future.    Hx of Gross Hematuria:  She was given 25 mg samples of Myrbetriq on 01/29/2018 by [name]. She hasn't taken Myrbetriq due to having a lot of medications and not wanting to take another medication if she didn't have too. She is not a smoker.   CT Urogram on 01/21/2018 showed: A specific cause for the patient's recurrent urinary tract infections and hematuria is not identified. Slight bladder wall thickening may well be from nondistention on today's exam. There is a moderate-sized right bladder diverticulum and a smaller left bladder diverticulum. No urinary tract calculi are identified. Mild cardiomegaly with right atrial prominence. Coronary atherosclerosis. Aortic Atherosclerosis (ICD10-I70.0). Levoconvex lumbar scoliosis.   Cystoscopy performed by [name] on 01/29/2018 showed no concerning findings on cystoscopy.   She notes that she has transportation issues as well.   Urinary frequency The patient is  experiencing urgency x 4-7, frequency x 4-7, not restricting fluids to avoid visits to the restroom, is engaging in toilet mapping, incontinence x 0-3 and nocturia  x 4-7.   Her PVR is 0 mL.  Her BP is 161/84.  Her most irritating symptom nocturia.   She drinks three 20 ounce bottles of water daily and stops at 7 pm.  Risk factors for nocturia: hypertension, diabetes, arthritis, heart disease and anxiety.   She has not had a sleep study.  She still has not taken the Myrbetriq samples.  She is dealing with diarrhea at this time and does not want to add another medication into the  mix.  Patient denies any gross hematuria, dysuria or suprapubic/flank pain.  Patient denies any fevers, chills, nausea or vomiting.   PMH: Past Medical History:  Diagnosis Date  . Allergy   . Anxiety   . Balance disorder   . Chronic diastolic CHF (congestive heart failure) ([place])   . Colon polyps   . Coronary artery disease, non-occlusive    a. LHC 11/2003: 10% LAD stenosis, 20% LCx stenosis, and 40% RCA stenosis; b. nuc stress test 12/15: small region of mild ischemia in the apical region with WMA also noted in the apical region, EF 60%. She declined invasive evaluation at that time  . DM2 (diabetes mellitus, type 2) ([place])   . Fall    04/2017 see careeverywhere [facility] multiple fractures, brusing   . GERD (gastroesophageal reflux disease)    esophageal web  . 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 History of chicken pox   . History of eating disorder   . HLD (hyperlipidemia)   . HTN (hypertension)   . Hypothyroidism   . IBS (irritable bowel syndrome)    diarrhea   . Incontinence of bowel   . Osteoporosis   . Ovarian cancer ([place])   . Persistent atrial fibrillation    a. CHADS2VASc = > 8 (CHF, HTN, age x 2, DM, TIA x 2, female); b. on Xarelto  . SBO (small bowel obstruction) ([place])    1998  . TIA (transient ischemic attack)   . UTI (urinary tract infection)   . Venous insufficiency     Surgical History: Past Surgical History:  Procedure Laterality Date  . 2nd look laparotomy  1982  . ABDOMINAL HYSTERECTOMY     for ovarian cancer s/p hysterectomy total 1976 and exp lap 1980  . APPENDECTOMY    . CARDIAC CATHETERIZATION  8/05   neg; a fib found   . CARPAL TUNNEL RELEASE Left 03/06/2018   Procedure: CARPAL TUNNEL RELEASE ENDOSCOPIC;  Surgeon: Corky Mull, MD;  Location: [facility];  Service: Orthopedics;  Laterality: Left;  diabetic - oral meds  . cataract OD  2002  . cataract surgery  5/07   R  . CHOLECYSTECTOMY  1986  . DEXA  9/04 and 1/02  .  EGD/dilation/colon  1/06  . ESOPHAGOGASTRODUODENOSCOPY (EGD) WITH PROPOFOL N/A 05/16/2016   Procedure: ESOPHAGOGASTRODUODENOSCOPY (EGD) WITH PROPOFOL with dilation;  Surgeon: Lucilla Lame, MD;  Location: [facility];  Service: Endoscopy;  Laterality: N/A;  . FEMORAL HERNIA REPAIR  1958   R  . fracture L elbow/wrist  2000  . intussception/obstruction  12/98  . JOINT REPLACEMENT     left shoulder  . kidney stone x2  1991  . laminectomy L4-5  1971  . LUMBAR LAMINECTOMY     1970 ruptured disc   . MOUTH SURGERY    . myoview stress  8/05   syncope (-)  . REVERSE SHOULDER ARTHROPLASTY Left 10/06/2014   Procedure: REVERSE SHOULDER ARTHROPLASTY;  Surgeon: Marshall Cork Poggi,  MD;  Location: [facility];  Service: Orthopedics;  Laterality: Left;  . stillbirth  1969  . TOTAL ABDOMINAL HYSTERECTOMY W/ BILATERAL SALPINGOOPHORECTOMY  1980   ovarian cancer  . WRIST FRACTURE SURGERY  11/05   R    Home Medications:  Allergies as of 03/26/2018      Reactions   Penicillins Anaphylaxis, Other (See Comments)   Has patient had a PCN reaction causing immediate rash, facial/tongue/throat swelling, SOB or lightheadedness with hypotension: Yes Has patient had a PCN reaction causing severe rash involving mucus membranes or skin necrosis: No Has patient had a PCN reaction that required hospitalization: No Has patient had a PCN reaction occurring within the last 10 years: No If all of the above answers are "NO", then may proceed with Cephalosporin use.   Actos [pioglitazone]    Not effective    Amaryl [glimepiride]    Not effective    Amiodarone    Thyroid issues    Atorvastatin Other (See Comments)   Muscle aches   Ciprofloxacin Other (See Comments)   High blood pressure, aches chest pressure    Codeine Nausea And Vomiting   Ezetimibe-simvastatin Other (See Comments)   Muscle aches, nausea, back pain    Fosamax [alendronate]    Lovastatin Other (See Comments)   Myalgias   Macrobid [nitrofurantoin  Macrocrystal]    Itching    Protonix [pantoprazole Sodium]    Esophageal problems    Rosiglitazone Maleate Other (See Comments)   Edema   Statins    Muscle aches to all    Sulfa Antibiotics    Itching   Victoza [liraglutide]    Nausea   Alendronate Sodium Rash   Dysphagia and ulceration    Bactrim [sulfamethoxazole-trimethoprim] Rash   itching      Medication List        Accurate as of 03/26/18  4:08 PM. Always use your most recent med list.          acetaminophen 500 MG tablet Commonly known as:  TYLENOL Take 1,000 mg by mouth every 6 (six) hours as needed for moderate pain or headache.   bisoprolol-hydrochlorothiazide 2.5-6.25 MG tablet Commonly known as:  ZIAC TAKE 1 TABLET DAILY.   Calcium Citrate 200 MG Tabs Take by mouth daily.   CALCIUM COMPLEX PO Take 1 tablet by mouth daily.   co-enzyme Q-10 30 MG capsule Take 30 mg by mouth 3 (three) times daily.   conjugated estrogens vaginal cream Commonly known as:  PREMARIN Apply 0.5mg  (pea-sized amount)  just inside the vaginal introitus with a finger-tip on  Reeves, Wednesday and Friday nights.   docusate sodium 100 MG capsule Commonly known as:  COLACE Take 100 mg by mouth 2 (two) times daily.   estradiol 0.1 MG/GM vaginal cream Commonly known as:  ESTRACE Apply 0.5mg  (pea-sized amount)  just inside the vaginal introitus with a finger-tip on Reeves, Wednesday and Friday nights.   FA-Vitamin B-6-Vitamin B-12 2.2-25-0.5 MG Tabs Place 1 tablet under the tongue daily.   glucose blood test strip Use to test blood sugar once daily E11.49   levothyroxine 50 MCG tablet Commonly known as:  SYNTHROID, LEVOTHROID Take 50 mcg by mouth daily before breakfast.   metFORMIN 500 MG 24 hr tablet Commonly known as:  GLUCOPHAGE-XR 500 mg. 1 AM, 2 PM   NON FORMULARY [place] Supplement B12 Takes qd. B6 qd, and folic acid   Omega-3 4332 MG Caps Take 2,000 mg by mouth daily.   traMADol 50 MG tablet Commonly  known  as:  ULTRAM Take 1 tablet (50 mg total) by mouth every 6 (six) hours as needed.   Vitamin C Powd Take 200 mg by mouth daily.   XARELTO 20 MG Tabs tablet Generic drug:  rivaroxaban TAKE 1 TABLET DAILY.       Allergies:  Allergies  Allergen Reactions  . Penicillins Anaphylaxis and Other (See Comments)    Has patient had a PCN reaction causing immediate rash, facial/tongue/throat swelling, SOB or lightheadedness with hypotension: Yes Has patient had a PCN reaction causing severe rash involving mucus membranes or skin necrosis: No Has patient had a PCN reaction that required hospitalization: No Has patient had a PCN reaction occurring within the last 10 years: No If all of the above answers are "NO", then may proceed with Cephalosporin use.   . Actos [Pioglitazone]     Not effective   . Amaryl [Glimepiride]     Not effective    . Amiodarone     Thyroid issues   . Atorvastatin Other (See Comments)    Muscle aches  . Ciprofloxacin Other (See Comments)    High blood pressure, aches chest pressure    . Codeine Nausea And Vomiting  . Ezetimibe-Simvastatin Other (See Comments)    Muscle aches, nausea, back pain   . Fosamax [Alendronate]   . Lovastatin Other (See Comments)    Myalgias  . Macrobid [Nitrofurantoin Macrocrystal]     Itching    . Protonix [Pantoprazole Sodium]     Esophageal problems    . Rosiglitazone Maleate Other (See Comments)    Edema  . Statins     Muscle aches to all   . Sulfa Antibiotics     Itching   . Victoza [Liraglutide]     Nausea   . Alendronate Sodium Rash    Dysphagia and ulceration   . Bactrim [Sulfamethoxazole-Trimethoprim] Rash    itching    Family History: Family History  Problem Relation Age of Onset  . Early death Father   . Diabetes Mother   . Heart disease Mother   . Arthritis Brother   . Cancer Brother   . Depression Brother   . Hearing loss Brother   . Arthritis Brother   . Cancer Brother        colon  . Hearing  loss Brother   . Cancer Brother   . Diabetes Brother   . Hearing loss Brother   . Heart disease Brother   . Hyperlipidemia Brother   . Heart disease Sister   . Hypertension Sister   . Stroke Sister   . Hearing loss Daughter   . Hypertension Daughter   . Diabetes Paternal Grandfather   . Hearing loss Sister   . Heart disease Sister   . Arthritis Sister   . Depression Sister   . Hearing loss Sister   . Heart disease Sister   . COPD Brother   . Early death Brother   . Early death Brother     Social History:  reports that she has never smoked. She has never used smokeless tobacco. She reports that she does not drink alcohol or use drugs.  ROS: UROLOGY Frequent Urination?: Yes Hard to postpone urination?: Yes Burning/pain with urination?: No Get up at night to urinate?: Yes Leakage of urine?: Yes Urine stream starts and stops?: No Trouble starting stream?: No Do you have to strain to urinate?: No Blood in urine?: No Urinary tract infection?: No Sexually transmitted disease?: No Injury to kidneys or  bladder?: No Painful intercourse?: No Weak stream?: No Currently pregnant?: No Vaginal bleeding?: No  Gastrointestinal Nausea?: Yes Vomiting?: No Indigestion/heartburn?: No Diarrhea?: Yes Constipation?: Yes  Constitutional Fever: No Night sweats?: No Weight loss?: No Fatigue?: Yes  Skin Skin rash/lesions?: No Itching?: No  Eyes Blurred vision?: No Double vision?: No  Ears/Nose/Throat Sore throat?: Yes Sinus problems?: Yes  Hematologic/Lymphatic Swollen glands?: No Easy bruising?: Yes  Cardiovascular Leg swelling?: Yes Chest pain?: Yes  Respiratory Cough?: Yes Shortness of breath?: No  Endocrine Excessive thirst?: No  Musculoskeletal Back pain?: Yes Joint pain?: Yes  Neurological Headaches?: No Dizziness?: No  Psychologic Depression?: No Anxiety?: No  Physical Exam: BP (!) 161/84 (BP Location: Left Arm, Patient Position: Sitting,  Cuff Size: Normal)   Pulse 85   Ht 5\' 3"  (1.6 m)   Wt 161 lb (73 kg)   BMI 28.52 kg/m   Constitutional: Well nourished. Alert and oriented, No acute distress. HEENT: [place] AT, moist mucus membranes. Trachea midline, no masses. Cardiovascular: No clubbing, cyanosis, or edema. Respiratory: Normal respiratory effort, no increased work of breathing. Skin: No rashes, bruises or suspicious lesions. Neurologic: Grossly intact, no focal deficits, moving all 4 extremities. Psychiatric: Normal mood and affect.   Laboratory Data: Lab Results  Component Value Date   WBC 6.2 10/05/2017   HGB 13.8 10/05/2017   HCT 40.2 10/05/2017   MCV 99.4 10/05/2017   PLT 179.0 10/05/2017    Lab Results  Component Value Date   CREATININE 0.69 01/07/2018    No results found for: PSA  No results found for: TESTOSTERONE  Lab Results  Component Value Date   HGBA1C 7.5 (H) 10/05/2017    Lab Results  Component Value Date   TSH 1.45 10/05/2017       Component Value Date/Time   CHOL 209 (H) 02/24/2014 1423   CHOL 187 11/17/2011 0849   HDL 51.60 02/24/2014 1423   HDL 56 11/17/2011 0849   CHOLHDL 4 02/24/2014 1423   VLDL 33.6 02/24/2014 1423   LDLCALC 124 (H) 02/24/2014 1423   LDLCALC 104 (H) 11/17/2011 0849    Lab Results  Component Value Date   AST 21 10/05/2017   Lab Results  Component Value Date   ALT 9 10/05/2017   No components found for: ALKALINEPHOPHATASE No components found for: BILIRUBINTOTAL  No results found for: ESTRADIOL  I have reviewed the labs.   Pertinent Imaging: Results for ROSINA, CRESSLER (MRN [id number]) as of 03/26/2018 15:50  Ref. Range 03/26/2018 15:37  Scan Result Unknown 0 ml     Assessment & Plan:    1. Nocturia - I explained to the patient that nocturia is often multi-factorial and difficult to treat.  Sleeping disorders, heart conditions, peripheral vascular disease, diabetes, an enlarged prostate for men, an urethral stricture causing bladder  outlet obstruction and/or certain medications can contribute to nocturia. - she is stopping fluids prior to bedtime - I have explained that research studies have showed that over 84% of patients with sleep apnea reported frequent nighttime urination.   With sleep apnea, oxygen decreases, carbon dioxide increases, the blood become more acidic, the heart rate drops and blood vessels in the lung constrict.  The body is then alerted that something is very wrong. The sleeper must wake enough to reopen the airway. By this time, the heart is racing and experiences a false signal of fluid overload. The heart excretes a hormone-like protein that tells the body to get rid of sodium and water, resulting in  nocturia. - There is also an increased incidence in sleep apnea with menopause, symptoms include night sweats, daytime sleepiness, depressed mood, and cognitive complaints like poor concentration or problems with short-term memory  - The patient may benefit from a discussion with his or her primary care physician to see if he or she has risk factors for sleep apnea or other sleep disturbances and obtaining a sleep study.  2. rUTI's Has been evaluated by Infectious Disease and they have recommended starting vaginal estrogen cream, urodynamics, and ensuring patient empties her bladder.  Urodynamic study deferred due to the patients transportation issues.  PVRs minimal in the office.  She was able to fill the vaginal estrogen cream (Estrace)   3. History of hematuria Hematuria work up completed in 01/29/2018 - with no concerning findings on cystoscopy.  No report of gross hematuria  RTC in one year for UA - patient to report any gross hematuria in the interim                                          Return in about 3 months (around 06/25/2018) for recheck .  These notes generated with voice recognition software. I apologize for typographical errors.  Laneta Simmers  [facility] [address]  [place], [place] [address] [phone]

## 2018-04-10 ENCOUNTER — Encounter: Payer: Self-pay | Admitting: Internal Medicine

## 2018-04-10 ENCOUNTER — Ambulatory Visit: Payer: Medicare HMO | Admitting: Internal Medicine

## 2018-04-10 VITALS — BP 140/80 | HR 78 | Temp 98.0°F | Ht 63.0 in | Wt 163.0 lb

## 2018-04-10 DIAGNOSIS — I83009 Varicose veins of unspecified lower extremity with ulcer of unspecified site: Secondary | ICD-10-CM | POA: Insufficient documentation

## 2018-04-10 DIAGNOSIS — Z85828 Personal history of other malignant neoplasm of skin: Secondary | ICD-10-CM | POA: Diagnosis not present

## 2018-04-10 DIAGNOSIS — E039 Hypothyroidism, unspecified: Secondary | ICD-10-CM

## 2018-04-10 DIAGNOSIS — S81802A Unspecified open wound, left lower leg, initial encounter: Secondary | ICD-10-CM

## 2018-04-10 DIAGNOSIS — I10 Essential (primary) hypertension: Secondary | ICD-10-CM

## 2018-04-10 DIAGNOSIS — R319 Hematuria, unspecified: Secondary | ICD-10-CM

## 2018-04-10 DIAGNOSIS — E1149 Type 2 diabetes mellitus with other diabetic neurological complication: Secondary | ICD-10-CM | POA: Diagnosis not present

## 2018-04-10 DIAGNOSIS — R600 Localized edema: Secondary | ICD-10-CM | POA: Diagnosis not present

## 2018-04-10 DIAGNOSIS — S81802D Unspecified open wound, left lower leg, subsequent encounter: Secondary | ICD-10-CM

## 2018-04-10 DIAGNOSIS — K5909 Other constipation: Secondary | ICD-10-CM

## 2018-04-10 DIAGNOSIS — Z23 Encounter for immunization: Secondary | ICD-10-CM

## 2018-04-10 DIAGNOSIS — E1142 Type 2 diabetes mellitus with diabetic polyneuropathy: Secondary | ICD-10-CM | POA: Diagnosis not present

## 2018-04-10 DIAGNOSIS — N3281 Overactive bladder: Secondary | ICD-10-CM | POA: Diagnosis not present

## 2018-04-10 DIAGNOSIS — R197 Diarrhea, unspecified: Secondary | ICD-10-CM

## 2018-04-10 DIAGNOSIS — Z87440 Personal history of urinary (tract) infections: Secondary | ICD-10-CM

## 2018-04-10 DIAGNOSIS — L97909 Non-pressure chronic ulcer of unspecified part of unspecified lower leg with unspecified severity: Secondary | ICD-10-CM | POA: Insufficient documentation

## 2018-04-10 NOTE — Patient Instructions (Addendum)
Vitamin B12 957 >300 pg/mL  Specimen Collected on  Blood 06/28/2017 9:30 AM   Giovannis Tea tree shampoo   05/19/18 schedule fasting labs   Consider vascular consult  Lymphedema  Lymphedema is swelling that is caused by the abnormal collection of lymph in the tissues under the skin. Lymph is fluid from the tissues in your body that is removed through the lymphatic system. This system is part of your body's defense system (immune system) and includes lymph nodes and lymph vessels. The lymph vessels collect and carry the excess fluid, fats, proteins, and wastes from the tissues of the body to the bloodstream. This system also works to clean and remove bacteria and waste products from the body. Lymphedema occurs when the lymphatic system is blocked. When the lymph vessels or lymph nodes are blocked or damaged, lymph does not drain properly. This causes an abnormal buildup of lymph, which leads to swelling in the affected area. This may include the trunk area, or an arm or leg. Lymphedema cannot be cured by medicines, but various methods can be used to help reduce the swelling. There are two types of lymphedema: primary lymphedema and secondary lymphedema. What are the causes? The cause of this condition depends on the type of lymphedema that you have.  Primary lymphedema is caused by the absence of lymph vessels or having abnormal lymph vessels at birth.  Secondary lymphedema occurs when lymph vessels are blocked or damaged. Secondary lymphedema is more common. Common causes of lymph vessel blockage include: ? Skin infection, such as cellulitis. ? Infection by parasites (filariasis). ? Injury. ? Radiation therapy. ? Cancer. ? Formation of scar tissue. ? Surgery. What are the signs or symptoms? Symptoms of this condition include:  Swelling of the arm or leg.  A heavy or tight feeling in the arm or leg.  Swelling of the feet, toes, or fingers. Shoes or rings may fit more tightly than  before.  Redness of the skin over the affected area.  Limited movement of the affected limb.  Sensitivity to touch or discomfort in the affected limb. How is this diagnosed? This condition may be diagnosed based on:  Your symptoms and medical history.  A physical exam.  Bioimpedance spectroscopy. In this test, painless electrical currents are used to measure fluid levels in your body.  Imaging tests, such as: ? Lymphoscintigraphy. In this test, a low dose of a radioactive substance is injected to trace the flow of lymph through the lymph vessels. ? MRI. ? CT scan. ? Duplex ultrasound. This test uses sound waves to produce images of the vessels and the blood flow on a screen. ? Lymphangiography. In this test, a contrast dye is injected into the lymph vessel to help show blockages. How is this treated? Treatment for this condition may depend on the cause of your lymphedema. Treatment may include:  Complete decongestive therapy (CDT). This is done by a certified lymphedema therapist to reduce fluid congestion. This therapy includes: ? Manual lymph drainage. This is a special massage technique that promotes lymph drainage out of a limb. ? Skin care. ? Compression wrapping of the affected area. ? Specific exercises. Certain exercises can help fluid move out of the affected limb.  Compression. Various methods may be used to apply pressure to the affected limb to reduce the swelling. They include: ? Wearing compression stockings or sleeves on the affected limb. ? Wrapping the affected limb with special bandages.  Surgery. This is usually done for severe cases only. For  example, surgery may be done if you have trouble moving the limb or if the swelling does not get better with other treatments. If an underlying condition is causing the lymphedema, treatment for that condition will be done. For example, antibiotic medicines may be used to treat an infection. Follow these instructions at  home: Self-care  The affected area is more likely to become injured or infected. Take these steps to help prevent infection: ? Keep the affected area clean and dry. ? Use approved creams or lotions to keep the skin moisturized. ? Protect your skin from cuts:  Use gloves while cooking or gardening.  Do not walk barefoot.  If you shave the affected area, use an Copy.  Do not wear tight clothes, shoes, or jewelry.  Eat a healthy diet that includes a lot of fruits and vegetables. Activity  Exercise regularly as directed by your health care provider.  Do not sit with your legs crossed.  When possible, keep the affected limb raised (elevated) above the level of your heart.  Avoid carrying things with an arm that is affected by lymphedema. General instructions  Wear compression stockings or sleeves as told by your health care provider.  Note any changes in size of the affected limb. You may be instructed to take regular measurements and keep track of them.  Take over-the-counter and prescription medicines only as told by your health care provider.  If you were prescribed an antibiotic medicine, take or apply it as told by your health care provider. Do not stop using the antibiotic even if you start to feel better.  Do not use heating pads or ice packs over the affected area.  Avoid having blood draws, IV insertions, or blood pressure checked on the affected limb.  Keep all follow-up visits as told by your health care provider. This is important. Contact a health care provider if you:  Continue to have swelling in your limb.  Have a cut that does not heal.  Have redness or pain in the affected area. Get help right away if you:  Have new swelling in your limb that comes on suddenly.  Develop purplish spots, rash or sores (lesions) on your affected limb.  Have shortness of breath.  Have a fever or chills. Summary  Lymphedema is swelling that is caused by the  abnormal collection of lymph in the tissues under the skin.  Lymph is fluid from the tissues in your body that is removed through the lymphatic system. This system collects and carries excess fluid, fats, proteins, and wastes from the tissues of the body to the bloodstream.  Lymphedema causes swelling, pain, and redness in the affected area. This may include the trunk area, or an arm or leg.  Treatment for this condition may depend on the cause of your lymphedema. Treatment may include complete decongestive therapy (CDT), compression methods, surgery, or treating the underlying cause. This information is not intended to replace advice given to you by your health care provider. Make sure you discuss any questions you have with your health care provider. Document Released: 02/05/2007 Document Revised: 04/23/2017 Document Reviewed: 04/23/2017 Elsevier Interactive Patient Education  2019 [employer].

## 2018-04-10 NOTE — Progress Notes (Signed)
Pre visit review using our clinic review tool, if applicable. No additional management support is needed unless otherwise documented below in the visit note. 

## 2018-04-10 NOTE — Progress Notes (Addendum)
Chief Complaint  Patient presents with  . Follow-up   F/u  1. DM 2 A1C 02/15/18 was 7.3 with Dr. Ladell Pier and she has f/u with Endocrine 06/28/2018 2. She is frustrated about all the appts she has and has transportation issues. She is also frustrated a lot of her winter items are upstairs [place] and having trouble getting around though she is using a cane today she expresses concerns in the shower though she has shower chair and pull down shower handle.  3. Overactive bladder and h/o UTI. F/u urology given estrace Rx but has not tried b/c using premarin samples but stopped the samples 04/05/18.  She never tried myrbetriq for overactive bladder due to concern for side effects and urology sent her to ID since she has had 5 urine cultures +UTI in the last 6 months to 1 year but ID per pt deferred back to urology for tx and did not Rx abx due to her having a lot of side effects to abx and culture sensitivity. Hematuria w/u as far as cystoscopy and CT renal 01/21/18 were negative  4. S/p left CTS Dr. Roland Rack and has upcoming appt 05/01/18 with Dr. Roland Rack doing well.  5. Bowel complaints of constipation and diarrhea-f/u with [name] GI who rec fiber she is doing otc metamucil and he also rec liquid immodium for diarrhea and no further w/u.  She does also c/o trouble swallowing at times which is why she only is doing metamucil 1x. She is had to have esophagus dilated in the past.  6. Chronic leg edema. She has tried compression stockings in the past and lasix in the past but she has been off lasix 20 mg bid since 01/2017 since it was not effective per the pt. She also has a nonhealing wound to left shin since skin cancer bx'ed by Dr. Kellie Moor. She wants to f/u with with Dr. Kellie Moor before any other specialist and will make appt. She has been doing wound care with soap and water and vasoline and reports wound has reduced in size from quarter to dime to size of pencil eraser and does not have drainage or  redness  She declines vascular referral at this time   Review of Systems  Constitutional: Negative for weight loss.  HENT: Negative for hearing loss.   Eyes: Negative for blurred vision.  Respiratory: Negative for shortness of breath.   Cardiovascular: Positive for leg swelling. Negative for chest pain.  Gastrointestinal: Positive for constipation and diarrhea. Negative for abdominal pain.  Musculoskeletal: Negative for falls.  Skin:       +nonhealing wound left shin s/p skin cancer surgery with dermatology summer 2019   Neurological: Negative for headaches.  Psychiatric/Behavioral: Negative for memory loss.   Past Medical History:  Diagnosis Date  . Allergy   . Anxiety   . Balance disorder   . Chronic diastolic CHF (congestive heart failure) ([place])   . Colon polyps   . Coronary artery disease, non-occlusive    a. LHC 11/2003: 10% LAD stenosis, 20% LCx stenosis, and 40% RCA stenosis; b. nuc stress test 12/15: small region of mild ischemia in the apical region with WMA also noted in the apical region, EF 60%. She declined invasive evaluation at that time  . DM2 (diabetes mellitus, type 2) ([place])   . Fall    04/2017 see careeverywhere [facility] multiple fractures, brusing   . GERD (gastroesophageal reflux disease)    esophageal web  . 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 History of chicken pox   . History of eating disorder   . HLD (hyperlipidemia)   . HTN (hypertension)   . Hypothyroidism   . IBS (irritable bowel syndrome)    diarrhea   . Incontinence of bowel   . Osteoporosis   . Ovarian cancer ([place])   . Persistent atrial fibrillation    a. CHADS2VASc = > 8 (CHF, HTN, age x 2, DM, TIA x 2, female); b. on Xarelto  . SBO (small bowel obstruction) ([place])    1998  . TIA (transient ischemic attack)   . UTI (urinary tract infection)   . Venous insufficiency    Past Surgical History:  Procedure Laterality Date  . 2nd look laparotomy  1982  . ABDOMINAL HYSTERECTOMY      for ovarian cancer s/p hysterectomy total 1976 and exp lap 1980  . APPENDECTOMY    . CARDIAC CATHETERIZATION  8/05   neg; a fib found   . CARPAL TUNNEL RELEASE Left 03/06/2018   Procedure: CARPAL TUNNEL RELEASE ENDOSCOPIC;  Surgeon: Corky Mull, MD;  Location: [facility];  Service: Orthopedics;  Laterality: Left;  diabetic - oral meds  . cataract OD  2002  . cataract surgery  5/07   R  . CHOLECYSTECTOMY  1986  . DEXA  9/04 and 1/02  . EGD/dilation/colon  1/06  . ESOPHAGOGASTRODUODENOSCOPY (EGD) WITH PROPOFOL N/A 05/16/2016   Procedure: ESOPHAGOGASTRODUODENOSCOPY (EGD) WITH PROPOFOL with dilation;  Surgeon: Lucilla Lame, MD;  Location: [facility];  Service: Endoscopy;  Laterality: N/A;  . FEMORAL HERNIA REPAIR  1958   R  . fracture L elbow/wrist  2000  . intussception/obstruction  12/98  . JOINT REPLACEMENT     left shoulder  . kidney stone x2  1991  . laminectomy L4-5  1971  . LUMBAR LAMINECTOMY     1970 ruptured disc   . MOUTH SURGERY    . myoview stress  8/05   syncope (-)  . REVERSE SHOULDER ARTHROPLASTY Left 10/06/2014   Procedure: REVERSE SHOULDER ARTHROPLASTY;  Surgeon: Corky Mull, MD;  Location: [facility];  Service: Orthopedics;  Laterality: Left;  . stillbirth  1969  . TOTAL ABDOMINAL HYSTERECTOMY W/ BILATERAL SALPINGOOPHORECTOMY  1980   ovarian cancer  . WRIST FRACTURE SURGERY  11/05   R   Family History  Problem Relation Age of Onset  . Early death Father   . Diabetes Mother   . Heart disease Mother   . Arthritis Brother   . Cancer Brother   . Depression Brother   . Hearing loss Brother   . Arthritis Brother   . Cancer Brother        colon  . Hearing loss Brother   . Cancer Brother   . Diabetes Brother   . Hearing loss Brother   . Heart disease Brother   . Hyperlipidemia Brother   . Heart disease Sister   . Hypertension Sister   . Stroke Sister   . Hearing loss Daughter   . Hypertension Daughter   . Diabetes Paternal Grandfather   .  Hearing loss Sister   . Heart disease Sister   . Arthritis Sister   . Depression Sister   . Hearing loss Sister   . Heart disease Sister   . COPD Brother   . Early death Brother   . Early death Brother    Social History   Socioeconomic History  . Marital status: Widowed    Spouse  name: Not on file  . Number of children: 1  . Years of education: Not on file  . Highest education level: Not on file  Occupational History  . 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 Financial resource strain: Not on file  . Food insecurity:    Worry: Not on file    Inability: Not on file  . Transportation needs:    Medical: Not on file    Non-medical: Not on file  Tobacco Use  . Smoking status: Never Smoker  . Smokeless tobacco: Never Used  Substance and Sexual Activity  . Alcohol use: No    Comment: may have occasional glass of wine  . Drug use: No  . Sexual activity: Not Currently  Lifestyle  . Physical activity:    Days per week: Not on file    Minutes per session: Not on file  . Stress: Not on file  Relationships  . Social connections:    Talks on phone: Not on file    Gets together: Not on file    Attends religious service: Not on file    Active member of club or organization: Not on file    Attends meetings of clubs or organizations: Not on file    Relationship status: Not on file  . Intimate partner violence:    Fear of current or ex partner: Not on file    Emotionally abused: Not on file    Physically abused: Not on file    Forced sexual activity: Not on file  Other Topics Concern  . 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 acetaminophen (TYLENOL) 500 MG tablet Take 1,000 mg by mouth  every 6 (six) hours as needed for moderate pain or headache.  . Ascorbic Acid (VITAMIN C) POWD Take 200 mg by mouth daily.   . bisoprolol-hydrochlorothiazide (ZIAC) 2.5-6.25 MG tablet TAKE 1 TABLET DAILY.  . Calcium Citrate 200 MG TABS Take by mouth daily.  Marland Kitchen co-enzyme Q-10 30 MG capsule Take 30 mg by mouth 3 (three) times daily.  Marland Kitchen docusate sodium (COLACE) 100 MG capsule Take 100 mg by mouth 2 (two) times daily.  . Folic Acid-Vit K8-JGO T15 (FA-VITAMIN B-6-VITAMIN B-12) 2.2-25-0.5 MG TABS Place 1 tablet under the tongue daily.   Marland Kitchen glucose blood (ONE TOUCH ULTRA TEST) test strip Use to test blood sugar once daily E11.49 (Patient taking differently: Use to test blood sugar twice daily E11.49)  . levothyroxine (SYNTHROID, LEVOTHROID) 50 MCG tablet Take 50 mcg by mouth daily before breakfast.  . metFORMIN (GLUCOPHAGE-XR) 500 MG 24 hr tablet 500 mg. 1 AM, 2 PM  . Multiple Vitamin (CALCIUM COMPLEX PO) Take 1 tablet by mouth daily.  . NON FORMULARY Trivita Supplement B12 Takes qd. B6 qd, and folic acid  . Omega-3 1000 MG CAPS Take 2,000 mg by mouth daily.   Alveda Reasons 20 MG TABS tablet TAKE 1 TABLET DAILY.   Allergies  Allergen Reactions  . Penicillins Anaphylaxis and Other (See Comments)    Has patient had a PCN reaction causing immediate rash, facial/tongue/throat swelling, SOB or lightheadedness with hypotension: Yes Has patient had  a PCN reaction causing severe rash involving mucus membranes or skin necrosis: No Has patient had a PCN reaction that required hospitalization: No Has patient had a PCN reaction occurring within the last 10 years: No If all of the above answers are "NO", then may proceed with Cephalosporin use.   . Actos [Pioglitazone]     Not effective   . Amaryl [Glimepiride]     Not effective    . Amiodarone     Severe Thyroid issues   . Atorvastatin Other (See Comments)    Muscle aches & All statins per Patient  . Ciprofloxacin Other (See Comments)    High blood  pressure, aches chest pressure    . Codeine Nausea And Vomiting  . Ezetimibe-Simvastatin Other (See Comments)    Muscle aches, nausea, back pain   . Fosamax [Alendronate]     dysphagia  . Lovastatin Other (See Comments)    Myalgias  . Macrobid [Nitrofurantoin Macrocrystal]     Severe Itching, rash   . Protonix [Pantoprazole Sodium]     Esophageal problems    . Rosiglitazone Maleate Other (See Comments)    Edema  . Statins     Muscle and joint aches to all statins  . Sulfa Antibiotics     Itching   . Victoza [Liraglutide]     Nausea   . Alendronate Sodium Rash    Dysphagia and ulceration   . Bactrim [Sulfamethoxazole-Trimethoprim] Rash    itching   Recent Results (from the past 2160 hour(s))  Urinalysis, Complete     Status: None   Collection Time: 01/29/18  2:23 PM  Result Value Ref Range   Specific Gravity, UA 1.020 1.005 - 1.030   pH, UA 5.0 5.0 - 7.5   Color, UA Yellow Yellow   Appearance Ur Clear Clear   Leukocytes, UA Negative Negative   Protein, UA Negative Negative/Trace   Glucose, UA Negative Negative   Ketones, UA Negative Negative   RBC, UA Negative Negative   Bilirubin, UA Negative Negative   Urobilinogen, Ur 0.2 0.2 - 1.0 mg/dL   Nitrite, UA Negative Negative   Microscopic Examination See below:   Microscopic Examination     Status: None   Collection Time: 01/29/18  2:23 PM  Result Value Ref Range   WBC, UA 0-5 0 - 5 /hpf   RBC, UA 0-2 0 - 2 /hpf   Epithelial Cells (non renal) None seen 0 - 10 /hpf   Bacteria, UA None seen None seen/Few  Urinalysis, Complete     Status: Abnormal   Collection Time: 02/21/18  2:58 PM  Result Value Ref Range   Specific Gravity, UA 1.015 1.005 - 1.030   pH, UA 7.0 5.0 - 7.5   Color, UA Yellow Yellow   Appearance Ur Cloudy (A) Clear   Leukocytes, UA 2+ (A) Negative   Protein, UA Negative Negative/Trace   Glucose, UA Negative Negative   Ketones, UA Negative Negative   RBC, UA Trace (A) Negative   Bilirubin, UA  Negative Negative   Urobilinogen, Ur 0.2 0.2 - 1.0 mg/dL   Nitrite, UA Negative Negative   Microscopic Examination See below:   Microscopic Examination     Status: Abnormal   Collection Time: 02/21/18  2:58 PM  Result Value Ref Range   WBC, UA 11-30 (A) 0 - 5 /hpf   RBC, UA None seen 0 - 2 /hpf   Epithelial Cells (non renal) None seen 0 - 10 /hpf   Bacteria, UA  Moderate (A) None seen/Few  CULTURE, URINE COMPREHENSIVE     Status: Abnormal   Collection Time: 02/21/18  3:22 PM  Result Value Ref Range   Urine Culture, Comprehensive Final report (A)    Organism ID, Bacteria Escherichia coli (A)     Comment: Greater than 100,000 colony forming units per mL Susceptibility profile is consistent with a probable ESBL.    ANTIMICROBIAL SUSCEPTIBILITY Comment     Comment:       ** S = Susceptible; I = Intermediate; R = Resistant **                    P = Positive; N = Negative             MICS are expressed in micrograms per mL    Antibiotic                 RSLT#1    RSLT#2    RSLT#3    RSLT#4 Amoxicillin/Clavulanic Acid    S Ampicillin                     R Cefazolin                      R Cefepime                       I Ceftriaxone                    R Cefuroxime                     R Ciprofloxacin                  S Ertapenem                      S Gentamicin                     S Imipenem                       S Levofloxacin                   S Meropenem                      S Nitrofurantoin                 S Piperacillin/Tazobactam        S Tetracycline                   S Tobramycin                     S Trimethoprim/Sulfa             S   Urinalysis, Complete     Status: Abnormal   Collection Time: 03/05/18  1:57 PM  Result Value Ref Range   Specific Gravity, UA 1.025 1.005 - 1.030   pH, UA 5.0 5.0 - 7.5   Color, UA Yellow Yellow   Appearance Ur Cloudy (A) Clear   Leukocytes, UA 1+ (A) Negative   Protein, UA Negative Negative/Trace   Glucose, UA Negative Negative    Ketones, UA Negative Negative   RBC, UA 1+ (A) Negative   Bilirubin, UA Negative Negative   Urobilinogen, Ur 0.2 0.2 -  1.0 mg/dL   Nitrite, UA Positive (A) Negative   Microscopic Examination See below:   Microscopic Examination     Status: Abnormal   Collection Time: 03/05/18  1:57 PM  Result Value Ref Range   WBC, UA >30 (H) 0 - 5 /hpf   RBC, UA None seen 0 - 2 /hpf   Epithelial Cells (non renal) 0-10 0 - 10 /hpf   Crystals Present (A) N/A   Crystal Type Calcium Oxalate N/A   Mucus, UA Present (A) Not Estab.   Bacteria, UA Many (A) None seen/Few  Bladder Scan (Post Void Residual) in office     Status: None   Collection Time: 03/05/18  2:03 PM  Result Value Ref Range   Scan Result 0   Glucose, capillary     Status: Abnormal   Collection Time: 03/06/18 11:07 AM  Result Value Ref Range   Glucose-Capillary 155 (H) 70 - 99 mg/dL  Glucose, capillary     Status: Abnormal   Collection Time: 03/06/18 12:26 PM  Result Value Ref Range   Glucose-Capillary 132 (H) 70 - 99 mg/dL  Bladder Scan (Post Void Residual) in office     Status: None   Collection Time: 03/26/18  3:37 PM  Result Value Ref Range   Scan Result 0 ml    Objective  Body mass index is 28.87 kg/m. Wt Readings from Last 3 Encounters:  04/10/18 163 lb (73.9 kg)  03/26/18 161 lb (73 kg)  03/12/18 160 lb 12.8 oz (72.9 kg)   Temp Readings from Last 3 Encounters:  04/10/18 98 F (36.7 C) (Oral)  03/06/18 (!) 97 F (36.1 C)  11/08/17 98.4 F (36.9 C) (Oral)   BP Readings from Last 3 Encounters:  04/10/18 140/80  03/26/18 (!) 161/84  03/12/18 (!) 162/84   Pulse Readings from Last 3 Encounters:  04/10/18 78  03/26/18 85  03/12/18 78    Physical Exam Vitals signs and nursing note reviewed.  Constitutional:      Appearance: Normal appearance.  HENT:     Head: Normocephalic and atraumatic.     Mouth/Throat:     Mouth: Mucous membranes are moist.     Pharynx: Oropharynx is clear.  Eyes:      Conjunctiva/sclera: Conjunctivae normal.     Pupils: Pupils are equal, round, and reactive to light.  Cardiovascular:     Rate and Rhythm: Normal rate and regular rhythm.     Heart sounds: Normal heart sounds.  Pulmonary:     Effort: Pulmonary effort is normal.     Breath sounds: Normal breath sounds.  Skin:    General: Skin is warm and dry.     Findings: Wound present.          Comments: 1 cm diameter wound to left shin nonhealing    Neurological:     General: No focal deficit present.     Mental Status: She is alert and oriented to person, place, and time.     Gait: Gait normal.  Psychiatric:        Attention and Perception: Attention and perception normal.        Mood and Affect: Mood and affect normal.        Speech: Speech normal.        Behavior: Behavior normal. Behavior is cooperative.        Thought Content: Thought content normal.        Cognition and Memory: Cognition and memory normal.  Judgment: Judgment normal.     Assessment   1. DM 2 A1C 7.3 02/15/18  2. Hypothyroidism  3. Overactive bladder, hematuria w/u negative cysto and CT renal 01/21/18, h/o UTI, recurrent 4. S/p CTS left Dr. Roland Rack  5. Alternating bowel habits constipation and diarrhea  6. Chronic leg edema likely lymphedema, venous insuff. Nonhealing wound left shin since summer 2019  7. HM Plan   1. Cont meds  F/u endocrine 06/28/2018 [facility] endocrine  sch fasting labs  2. Cont meds  3. F/u urology prn  Has estrace at home not using CT renal 01/21/18 and cystoscopy w/u negative  4. F/u ortho 05/01/2018  5. Prn fiber metamucil and liq immodium per GI [name] 6. Disc with pt she she can f/u with dermatology Dr. Kellie Moor about wound but ultimately rec vascular +/-wound clinic consult to try to heal the wound with unna boot Refer back to Dr. Kellie Moor for now CC note to her   Given Tdap today  7.  Flu shot 02/13/18  prevnar had 03/03/16  Check [facility] for pna 23 had 2005 per pt stated had more  recently at [employer] court advised to look for copy of proof if not will need pna 23  Tdap given today x 1  zostervax had  Consider shingrix  Declines check MMR Reviewed B12 lab normal 957 06/28/17   Out of age window:  mammo Pap Colonoscopy  Declines DEXA  She is established with dermatology h/o [facility] Dr. Clemon Chambers now [facility] left leg   05/01/2018 Dr. Roland Rack left CTS left f/u CTS release doing better  Provider: Dr. [name]-[facility]

## 2018-04-12 NOTE — Progress Notes (Signed)
Note has been faxed.

## 2018-04-12 NOTE — Progress Notes (Signed)
Pharmacy did not have record. Nor does [facility]

## 2018-04-15 ENCOUNTER — Ambulatory Visit: Payer: Medicare HMO | Admitting: Podiatry

## 2018-04-29 ENCOUNTER — Ambulatory Visit: Payer: Medicare HMO | Admitting: Podiatry

## 2018-04-29 DIAGNOSIS — R69 Illness, unspecified: Secondary | ICD-10-CM | POA: Diagnosis not present

## 2018-05-02 ENCOUNTER — Ambulatory Visit: Payer: Medicare HMO | Admitting: Podiatry

## 2018-05-02 DIAGNOSIS — E113293 Type 2 diabetes mellitus with mild nonproliferative diabetic retinopathy without macular edema, bilateral: Secondary | ICD-10-CM | POA: Diagnosis not present

## 2018-05-02 DIAGNOSIS — H353131 Nonexudative age-related macular degeneration, bilateral, early dry stage: Secondary | ICD-10-CM | POA: Diagnosis not present

## 2018-05-03 ENCOUNTER — Other Ambulatory Visit (INDEPENDENT_AMBULATORY_CARE_PROVIDER_SITE_OTHER): Payer: Medicare HMO

## 2018-05-03 DIAGNOSIS — E1149 Type 2 diabetes mellitus with other diabetic neurological complication: Secondary | ICD-10-CM | POA: Diagnosis not present

## 2018-05-03 DIAGNOSIS — E039 Hypothyroidism, unspecified: Secondary | ICD-10-CM | POA: Diagnosis not present

## 2018-05-03 DIAGNOSIS — E1142 Type 2 diabetes mellitus with diabetic polyneuropathy: Secondary | ICD-10-CM | POA: Diagnosis not present

## 2018-05-03 DIAGNOSIS — I10 Essential (primary) hypertension: Secondary | ICD-10-CM

## 2018-05-03 LAB — CBC WITH DIFFERENTIAL/PLATELET
BASOS ABS: 0 10*3/uL (ref 0.0–0.1)
Basophils Relative: 0.6 % (ref 0.0–3.0)
EOS ABS: 0.1 10*3/uL (ref 0.0–0.7)
Eosinophils Relative: 1.6 % (ref 0.0–5.0)
HEMATOCRIT: 37.9 % (ref 36.0–46.0)
Hemoglobin: 13 g/dL (ref 12.0–15.0)
LYMPHS PCT: 29 % (ref 12.0–46.0)
Lymphs Abs: 1.6 10*3/uL (ref 0.7–4.0)
MCHC: 34.4 g/dL (ref 30.0–36.0)
MCV: 99.9 fl (ref 78.0–100.0)
MONOS PCT: 6.2 % (ref 3.0–12.0)
Monocytes Absolute: 0.3 10*3/uL (ref 0.1–1.0)
Neutro Abs: 3.4 10*3/uL (ref 1.4–7.7)
Neutrophils Relative %: 62.6 % (ref 43.0–77.0)
Platelets: 176 10*3/uL (ref 150.0–400.0)
RBC: 3.8 Mil/uL — ABNORMAL LOW (ref 3.87–5.11)
RDW: 13.8 % (ref 11.5–15.5)
WBC: 5.4 10*3/uL (ref 4.0–10.5)

## 2018-05-03 LAB — COMPREHENSIVE METABOLIC PANEL
ALBUMIN: 4 g/dL (ref 3.5–5.2)
ALK PHOS: 54 U/L (ref 39–117)
ALT: 7 U/L (ref 0–35)
AST: 20 U/L (ref 0–37)
BILIRUBIN TOTAL: 0.7 mg/dL (ref 0.2–1.2)
BUN: 18 mg/dL (ref 6–23)
CALCIUM: 9.5 mg/dL (ref 8.4–10.5)
CO2: 30 mEq/L (ref 19–32)
CREATININE: 0.67 mg/dL (ref 0.40–1.20)
Chloride: 102 mEq/L (ref 96–112)
GFR: 88.13 mL/min (ref >60.00)
Glucose, Bld: 154 mg/dL — ABNORMAL HIGH (ref 70–99)
Potassium: 4.5 mEq/L (ref 3.5–5.1)
Sodium: 139 mEq/L (ref 135–145)
TOTAL PROTEIN: 7.2 g/dL (ref 6.0–8.3)

## 2018-05-03 LAB — LIPID PANEL
Cholesterol: 183 mg/dL (ref 0–200)
HDL: 66.9 mg/dL (ref >39.00)
LDL Cholesterol: 99 mg/dL (ref 0–99)
NonHDL: 116.31
Total CHOL/HDL Ratio: 3
Triglycerides: 87 mg/dL (ref 0.0–149.0)
VLDL: 17.4 mg/dL (ref 0.0–40.0)

## 2018-05-03 LAB — MICROALBUMIN / CREATININE URINE RATIO
CREATININE, U: 101.2 mg/dL
MICROALB UR: 5.2 mg/dL — AB (ref 0.0–1.9)
MICROALB/CREAT RATIO: 5.1 mg/g (ref 0.0–30.0)

## 2018-05-03 LAB — TSH: TSH: 1.43 u[IU]/mL (ref 0.35–4.50)

## 2018-05-03 LAB — HEMOGLOBIN A1C: Hgb A1c MFr Bld: 7.5 % — ABNORMAL HIGH (ref 4.6–6.5)

## 2018-05-06 ENCOUNTER — Ambulatory Visit: Payer: Medicare HMO | Admitting: Podiatry

## 2018-05-09 ENCOUNTER — Telehealth: Payer: Self-pay

## 2018-05-09 NOTE — Telephone Encounter (Signed)
Copied from [place]. Topic: Appointment Scheduling - Scheduling Inquiry for Clinic >> May 09, 2018  4:12 PM Sheran Luz wrote: Reason for CRM: Patient would like to know if she can reschedule her appointment tomorrow 1/17 for another day next week. Only same day appointments available-unable to schedule. Please advise.

## 2018-05-09 NOTE — Telephone Encounter (Signed)
Please find a place to fit her in the schedule when she wants to come   [place]

## 2018-05-10 ENCOUNTER — Ambulatory Visit: Payer: Medicare HMO | Admitting: Internal Medicine

## 2018-05-10 NOTE — Telephone Encounter (Signed)
Pt is returning brock call

## 2018-05-13 ENCOUNTER — Encounter: Payer: Self-pay | Admitting: Podiatry

## 2018-05-13 ENCOUNTER — Ambulatory Visit: Payer: Medicare HMO | Admitting: Podiatry

## 2018-05-13 DIAGNOSIS — E1149 Type 2 diabetes mellitus with other diabetic neurological complication: Secondary | ICD-10-CM | POA: Diagnosis not present

## 2018-05-13 DIAGNOSIS — D689 Coagulation defect, unspecified: Secondary | ICD-10-CM

## 2018-05-13 DIAGNOSIS — B351 Tinea unguium: Secondary | ICD-10-CM

## 2018-05-13 DIAGNOSIS — M79676 Pain in unspecified toe(s): Secondary | ICD-10-CM

## 2018-05-13 DIAGNOSIS — E1140 Type 2 diabetes mellitus with diabetic neuropathy, unspecified: Secondary | ICD-10-CM

## 2018-05-13 NOTE — Telephone Encounter (Signed)
Appointment has been scheduled [id number]

## 2018-05-13 NOTE — Progress Notes (Signed)
She presents today chief concern of the second toe on her left foot.  Her dermatologist sent her over because she was concerned that it may be an ulcer forming.  Objective: Vital signs are stable she is alert and oriented x3.  Toenails are long thick yellow dystrophic-like mycotic she has a reactive hyperkeratotic lesion distal aspect of the second digit of the left foot once debrided demonstrates no open lesions or wounds.  Assessment: Pain in limb secondary to onychomycosis and diabetic ulceration which is gone on to heal.  Plan: Debrided toenails today 1 through 5 bilaterally and placed silicone digital condom to the second digit of the left foot to help prevent any irritation.  I will follow-up with her as needed.

## 2018-05-15 DIAGNOSIS — L97829 Non-pressure chronic ulcer of other part of left lower leg with unspecified severity: Secondary | ICD-10-CM | POA: Diagnosis not present

## 2018-05-21 ENCOUNTER — Ambulatory Visit: Payer: Medicare HMO | Admitting: Internal Medicine

## 2018-05-23 ENCOUNTER — Encounter: Payer: Self-pay | Admitting: Internal Medicine

## 2018-05-23 ENCOUNTER — Ambulatory Visit: Payer: Medicare HMO | Admitting: Internal Medicine

## 2018-05-23 VITALS — BP 134/78 | HR 70 | Temp 98.2°F | Ht 63.0 in | Wt 164.4 lb

## 2018-05-23 DIAGNOSIS — Z9181 History of falling: Secondary | ICD-10-CM | POA: Diagnosis not present

## 2018-05-23 DIAGNOSIS — E1142 Type 2 diabetes mellitus with diabetic polyneuropathy: Secondary | ICD-10-CM | POA: Diagnosis not present

## 2018-05-23 DIAGNOSIS — Z9189 Other specified personal risk factors, not elsewhere classified: Secondary | ICD-10-CM | POA: Diagnosis not present

## 2018-05-23 DIAGNOSIS — I10 Essential (primary) hypertension: Secondary | ICD-10-CM

## 2018-05-23 DIAGNOSIS — K5909 Other constipation: Secondary | ICD-10-CM | POA: Diagnosis not present

## 2018-05-23 DIAGNOSIS — R197 Diarrhea, unspecified: Secondary | ICD-10-CM | POA: Diagnosis not present

## 2018-05-23 NOTE — Patient Instructions (Addendum)
[facility] -$20 leg cramps    Results for Jodi Reeves, Jodi Reeves (MRN [id number]) as of 05/23/2018 11:53  Ref. Range 05/03/2018 11:19  Sodium Latest Ref Range: 135 - 145 mEq/L 139  Potassium Latest Ref Range: 3.5 - 5.1 mEq/L 4.5  Chloride Latest Ref Range: 96 - 112 mEq/L 102  CO2 Latest Ref Range: 19 - 32 mEq/L 30  Glucose Latest Ref Range: 70 - 99 mg/dL 154 (H)  BUN Latest Ref Range: 6 - 23 mg/dL 18  Creatinine Latest Ref Range: 0.40 - 1.20 mg/dL 0.67  Calcium Latest Ref Range: 8.4 - 10.5 mg/dL 9.5  Alkaline Phosphatase Latest Ref Range: 39 - 117 U/L 54  Albumin Latest Ref Range: 3.5 - 5.2 g/dL 4.0  AST Latest Ref Range: 0 - 37 U/L 20  ALT Latest Ref Range: 0 - 35 U/L 7  Total Protein Latest Ref Range: 6.0 - 8.3 g/dL 7.2  Total Bilirubin Latest Ref Range: 0.2 - 1.2 mg/dL 0.7  GFR Latest Ref Range: >60.00 mL/min 88.13  Total CHOL/HDL Ratio Unknown 3  Cholesterol Latest Ref Range: 0 - 200 mg/dL 183  HDL Cholesterol Latest Ref Range: >39.00 mg/dL 66.90  LDL (calc) Latest Ref Range: 0 - 99 mg/dL 99  MICROALB/CREAT RATIO Latest Ref Range: 0.0 - 30.0 mg/g 5.1  NonHDL Unknown 116.31  Triglycerides Latest Ref Range: 0.0 - 149.0 mg/dL 87.0  VLDL Latest Ref Range: 0.0 - 40.0 mg/dL 17.4  Results for Jodi Reeves, Jodi Reeves (MRN [id number]) as of 05/23/2018 11:53  Ref. Range 10/05/2017 14:36  VITD Latest Ref Range: 30.00 - 100.00 ng/mL 57.41    Results for Jodi Reeves, Jodi Reeves (MRN [id number]) as of 05/23/2018 11:53  Ref. Range 05/03/2018 11:19  Total CHOL/HDL Ratio Unknown 3  Cholesterol Latest Ref Range: 0 - 200 mg/dL 183  HDL Cholesterol Latest Ref Range: >39.00 mg/dL 66.90  LDL (calc) Latest Ref Range: 0 - 99 mg/dL 99  MICROALB/CREAT RATIO Latest Ref Range: 0.0 - 30.0 mg/g 5.1  NonHDL Unknown 116.31  Triglycerides Latest Ref Range: 0.0 - 149.0 mg/dL 87.0  VLDL Latest Ref Range: 0.0 - 40.0 mg/dL 17.4  Results for Jodi Reeves, Jodi Reeves (MRN [id number]) as of 05/23/2018 11:53  Ref. Range 05/03/2018 11:19  WBC  Latest Ref Range: 4.0 - 10.5 K/uL 5.4  RBC Latest Ref Range: 3.87 - 5.11 Mil/uL 3.80 (L)  Hemoglobin Latest Ref Range: 12.0 - 15.0 g/dL 13.0  HCT Latest Ref Range: 36.0 - 46.0 % 37.9  MCV Latest Ref Range: 78.0 - 100.0 fl 99.9  MCHC Latest Ref Range: 30.0 - 36.0 g/dL 34.4  RDW Latest Ref Range: 11.5 - 15.5 % 13.8  Platelets Latest Ref Range: 150.0 - 400.0 K/uL 176.0  Neutrophils Latest Ref Range: 43.0 - 77.0 % 62.6  Lymphocytes Latest Ref Range: 12.0 - 46.0 % 29.0  Monocytes Relative Latest Ref Range: 3.0 - 12.0 % 6.2  Eosinophil Latest Ref Range: 0.0 - 5.0 % 1.6  Basophil Latest Ref Range: 0.0 - 3.0 % 0.6  NEUT# Latest Ref Range: 1.4 - 7.7 K/uL 3.4  Lymphocyte # Latest Ref Range: 0.7 - 4.0 K/uL 1.6  Monocyte # Latest Ref Range: 0.1 - 1.0 K/uL 0.3  Eosinophils Absolute Latest Ref Range: 0.0 - 0.7 K/uL 0.1  Basophils Absolute Latest Ref Range: 0.0 - 0.1 K/uL 0.0  Results for Jodi Reeves, Jodi Reeves (MRN [id number]) as of 05/23/2018 11:53  Ref. Range 05/03/2018 11:19  Glucose Latest Ref Range: 70 - 99 mg/dL 154 (H)  Hemoglobin A1C Latest Ref  Range: 4.6 - 6.5 % 7.5 (H)  TSH Latest Ref Range: 0.35 - 4.50 uIU/mL 1.43    Results for Jodi Reeves, Jodi Reeves (MRN [id number]) as of 05/23/2018 11:53  Ref. Range 05/03/2018 11:19  Creatinine,U Latest Units: mg/dL 101.2  Microalb, Ur Latest Ref Range: 0.0 - 1.9 mg/dL 5.2 (H)  MICROALB/CREAT RATIO Latest Ref Range: 0.0 - 30.0 mg/g 5.1

## 2018-05-23 NOTE — Progress Notes (Signed)
Chief Complaint  Patient presents with  . Follow-up   F/u  1. Frequent falls though none in months b/l walks with cane she reports she has fallen in the past she has fracture left shoulder, left wrist with residual numbness in left finger and h/o balance issues.  She wants to know if ok to drive  2. DM 2 A1C 7.5 with proteinuria pt does not want to take statin and not on ACEI/ARB will ask cardiology Dr. Rockey Situ what he thinks about changing bb-hct to arb alone low dose?  -[facility]  3. Chronic bloating gas alternating with constipation and diarrhea she is established with GI [name]  4. Wants handicap sticker filled out today  5. HTN BP controlled today and home readings low normal low 100s/70s  Review of Systems  Constitutional: Negative for weight loss.  HENT: Negative for hearing loss.   Eyes: Negative for blurred vision.  Respiratory: Negative for shortness of breath.   Cardiovascular: Negative for chest pain.  Gastrointestinal: Negative for abdominal pain.  Genitourinary: Positive for frequency.  Musculoskeletal: Negative for falls.  Skin: Negative for rash.  Neurological: Negative for headaches.  Psychiatric/Behavioral: Negative for memory loss.   Past Medical History:  Diagnosis Date  . Allergy   . Anxiety   . Balance disorder   . Chronic diastolic CHF (congestive heart failure) ([place])   . Colon polyps   . Coronary artery disease, non-occlusive    a. LHC 11/2003: 10% LAD stenosis, 20% LCx stenosis, and 40% RCA stenosis; b. nuc stress test 12/15: small region of mild ischemia in the apical region with WMA also noted in the apical region, EF 60%. She declined invasive evaluation at that time  . DM2 (diabetes mellitus, type 2) ([place])   . Fall    04/2017 see careeverywhere [facility] multiple fractures, brusing   . GERD (gastroesophageal reflux disease)    esophageal web  . 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 History of chicken pox   . History of eating  disorder   . HLD (hyperlipidemia)   . HTN (hypertension)   . Hypothyroidism   . IBS (irritable bowel syndrome)    diarrhea   . Incontinence of bowel   . Osteoporosis   . Ovarian cancer ([place])   . Persistent atrial fibrillation    a. CHADS2VASc = > 8 (CHF, HTN, age x 2, DM, TIA x 2, female); b. on Xarelto  . SBO (small bowel obstruction) ([place])    1998  . TIA (transient ischemic attack)   . UTI (urinary tract infection)   . Venous insufficiency    Past Surgical History:  Procedure Laterality Date  . 2nd look laparotomy  1982  . ABDOMINAL HYSTERECTOMY     for ovarian cancer s/p hysterectomy total 1976 and exp lap 1980  . APPENDECTOMY    . CARDIAC CATHETERIZATION  8/05   neg; a fib found   . CARPAL TUNNEL RELEASE Left 03/06/2018   Procedure: CARPAL TUNNEL RELEASE ENDOSCOPIC;  Surgeon: Corky Mull, MD;  Location: [place];  Service: Orthopedics;  Laterality: Left;  diabetic - oral meds  . cataract OD  2002  . cataract surgery  5/07   R  . CHOLECYSTECTOMY  1986  . DEXA  9/04 and 1/02  . EGD/dilation/colon  1/06  . ESOPHAGOGASTRODUODENOSCOPY (EGD) WITH PROPOFOL N/A 05/16/2016   Procedure: ESOPHAGOGASTRODUODENOSCOPY (EGD) WITH PROPOFOL with dilation;  Surgeon: Lucilla Lame, MD;  Location: [facility];  Service:  Endoscopy;  Laterality: N/A;  . FEMORAL HERNIA REPAIR  1958   R  . fracture L elbow/wrist  2000  . intussception/obstruction  12/98  . JOINT REPLACEMENT     left shoulder  . kidney stone x2  1991  . laminectomy L4-5  1971  . LUMBAR LAMINECTOMY     1970 ruptured disc   . MOUTH SURGERY    . myoview stress  8/05   syncope (-)  . REVERSE SHOULDER ARTHROPLASTY Left 10/06/2014   Procedure: REVERSE SHOULDER ARTHROPLASTY;  Surgeon: Corky Mull, MD;  Location: [facility];  Service: Orthopedics;  Laterality: Left;  . stillbirth  1969  . TOTAL ABDOMINAL HYSTERECTOMY W/ BILATERAL SALPINGOOPHORECTOMY  1980   ovarian cancer  . WRIST FRACTURE SURGERY  11/05   R    Family History  Problem Relation Age of Onset  . Early death Father   . Diabetes Mother   . Heart disease Mother   . Arthritis Brother   . Cancer Brother   . Depression Brother   . Hearing loss Brother   . Arthritis Brother   . Cancer Brother        colon  . Hearing loss Brother   . Cancer Brother   . Diabetes Brother   . Hearing loss Brother   . Heart disease Brother   . Hyperlipidemia Brother   . Heart disease Sister   . Hypertension Sister   . Stroke Sister   . Hearing loss Daughter   . Hypertension Daughter   . Diabetes Paternal Grandfather   . Hearing loss Sister   . Heart disease Sister   . Arthritis Sister   . Depression Sister   . Hearing loss Sister   . Heart disease Sister   . COPD Brother   . Early death Brother   . Early death Brother    Social History   Socioeconomic History  . Marital status: Widowed    Spouse name: Not on file  . Number of children: 1  . Years of education: Not on file  . Highest education level: Not on file  Occupational History  . 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 Financial resource strain: Not on file  . Food insecurity:    Worry: Not on file    Inability: Not on file  . Transportation needs:    Medical: Not on file    Non-medical: Not on file  Tobacco Use  . Smoking status: Never Smoker  . Smokeless tobacco: Never Used  Substance and Sexual Activity  . Alcohol use: No    Comment: may have occasional glass of wine  . Drug use: No  . Sexual activity: Not Currently  Lifestyle  . Physical activity:    Days per week: Not on file    Minutes per session: Not on file  . Stress: Not on file  Relationships  . Social connections:    Talks on phone: Not on file    Gets together: Not on file    Attends religious service: Not on file    Active member of club or organization: Not on file    Attends meetings of clubs or organizations: Not on file    Relationship status: Not on file  . Intimate partner  violence:    Fear of current or ex partner: Not on file    Emotionally abused: Not on file    Physically abused: Not on file    Forced sexual activity: Not on  file  Other Topics Concern  . 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 acetaminophen (TYLENOL) 500 MG tablet Take 1,000 mg by mouth every 6 (six) hours as needed for moderate pain or headache.  . Ascorbic Acid (VITAMIN C) POWD Take 200 mg by mouth daily.   . bisoprolol-hydrochlorothiazide (ZIAC) 2.5-6.25 MG tablet TAKE 1 TABLET DAILY.  . Calcium Citrate 200 MG TABS Take by mouth daily.  Marland Kitchen co-enzyme Q-10 30 MG capsule Take 30 mg by mouth 3 (three) times daily.  Marland Kitchen docusate sodium (COLACE) 100 MG capsule Take 100 mg by mouth 2 (two) times daily.  Marland Kitchen estradiol (ESTRACE VAGINAL) 0.1 MG/GM vaginal cream Apply 0.71m (pea-sized amount)  just inside the vaginal introitus with a finger-tip on Monday, Wednesday and Friday nights.  . Folic Acid-Vit [id number](FA-VITAMIN B-6-VITAMIN B-12) 2.2-25-0.5 MG TABS Place 1 tablet under the tongue daily.   .Marland Kitchenglucose blood (ONE TOUCH ULTRA TEST) test strip Use to test blood sugar once daily E11.49 (Patient taking differently: Use to test blood sugar twice daily E11.49)  . levothyroxine (SYNTHROID, LEVOTHROID) 50 MCG tablet Take 50 mcg by mouth daily before breakfast.  . metFORMIN (GLUCOPHAGE-XR) 500 MG 24 hr tablet 500 mg. 1 AM, 2 PM  . Multiple Vitamin (CALCIUM COMPLEX PO) Take 1 tablet by mouth daily.  . NON FORMULARY Trivita Supplement B12 Takes qd. B6 qd, and folic acid  . Omega-3 1000 MG CAPS Take 2,000 mg by mouth daily.   .Alveda Reasons20 MG TABS tablet TAKE 1 TABLET DAILY.   Allergies  Allergen Reactions  . Penicillins  Anaphylaxis and Other (See Comments)    Has patient had a PCN reaction causing immediate rash, facial/tongue/throat swelling, SOB or lightheadedness with hypotension: Yes Has patient had a PCN reaction causing severe rash involving mucus membranes or skin necrosis: No Has patient had a PCN reaction that required hospitalization: No Has patient had a PCN reaction occurring within the last 10 years: No If all of the above answers are "NO", then may proceed with Cephalosporin use.   . Actos [Pioglitazone]     Not effective   . Amaryl [Glimepiride]     Not effective    . Amiodarone     Severe Thyroid issues   . Atorvastatin Other (See Comments)    Muscle aches & All statins per Patient  . Ciprofloxacin Other (See Comments)    High blood pressure, aches chest pressure    . Codeine Nausea And Vomiting  . Ezetimibe-Simvastatin Other (See Comments)    Muscle aches, nausea, back pain   . Fosamax [Alendronate]     dysphagia  . Lovastatin Other (See Comments)    Myalgias  . Macrobid [Nitrofurantoin Macrocrystal]     Severe Itching, rash   . Protonix [Pantoprazole Sodium]     Esophageal problems    . Rosiglitazone Maleate Other (See Comments)    Edema  . Statins     Muscle and joint aches to all statins  . Sulfa Antibiotics     Itching   . Victoza [Liraglutide]     Nausea   . Alendronate Sodium Rash  Dysphagia and ulceration   . Bactrim [Sulfamethoxazole-Trimethoprim] Rash    itching   Recent Results (from the past 2160 hour(s))  Urinalysis, Complete     Status: Abnormal   Collection Time: 03/05/18  1:57 PM  Result Value Ref Range   Specific Gravity, UA 1.025 1.005 - 1.030   pH, UA 5.0 5.0 - 7.5   Color, UA Yellow Yellow   Appearance Ur Cloudy (A) Clear   Leukocytes, UA 1+ (A) Negative   Protein, UA Negative Negative/Trace   Glucose, UA Negative Negative   Ketones, UA Negative Negative   RBC, UA 1+ (A) Negative   Bilirubin, UA Negative Negative   Urobilinogen, Ur  0.2 0.2 - 1.0 mg/dL   Nitrite, UA Positive (A) Negative   Microscopic Examination See below:   Microscopic Examination     Status: Abnormal   Collection Time: 03/05/18  1:57 PM  Result Value Ref Range   WBC, UA >30 (H) 0 - 5 /hpf   RBC, UA None seen 0 - 2 /hpf   Epithelial Cells (non renal) 0-10 0 - 10 /hpf   Crystals Present (A) N/A   Crystal Type Calcium Oxalate N/A   Mucus, UA Present (A) Not Estab.   Bacteria, UA Many (A) None seen/Few  Bladder Scan (Post Void Residual) in office     Status: None   Collection Time: 03/05/18  2:03 PM  Result Value Ref Range   Scan Result 0   Glucose, capillary     Status: Abnormal   Collection Time: 03/06/18 11:07 AM  Result Value Ref Range   Glucose-Capillary 155 (H) 70 - 99 mg/dL  Glucose, capillary     Status: Abnormal   Collection Time: 03/06/18 12:26 PM  Result Value Ref Range   Glucose-Capillary 132 (H) 70 - 99 mg/dL  Bladder Scan (Post Void Residual) in office     Status: None   Collection Time: 03/26/18  3:37 PM  Result Value Ref Range   Scan Result 0 ml   Urine Microalbumin w/creat. ratio     Status: Abnormal   Collection Time: 05/03/18 11:19 AM  Result Value Ref Range   Microalb, Ur 5.2 (H) 0.0 - 1.9 mg/dL   Creatinine,U 101.2 mg/dL   Microalb Creat Ratio 5.1 0.0 - 30.0 mg/g  Lipid panel     Status: None   Collection Time: 05/03/18 11:19 AM  Result Value Ref Range   Cholesterol 183 0 - 200 mg/dL    Comment: ATP III Classification       Desirable:  < 200 mg/dL               Borderline High:  200 - 239 mg/dL          High:  > = 240 mg/dL   Triglycerides 87.0 0.0 - 149.0 mg/dL    Comment: Normal:  <150 mg/dLBorderline High:  150 - 199 mg/dL   HDL 66.90 >39.00 mg/dL   VLDL 17.4 0.0 - 40.0 mg/dL   LDL Cholesterol 99 0 - 99 mg/dL   Total CHOL/HDL Ratio 3     Comment:                Men          Women1/2 Average Risk     3.4          3.3Average Risk          5.0          4.42X Average Risk  9.6          7.13X Average Risk           15.0          11.0                       NonHDL 116.31     Comment: NOTE:  Non-HDL goal should be 30 mg/dL higher than patient's LDL goal (i.e. LDL goal of < 70 mg/dL, would have non-HDL goal of < 100 mg/dL)  Hemoglobin A1c     Status: Abnormal   Collection Time: 05/03/18 11:19 AM  Result Value Ref Range   Hgb A1c MFr Bld 7.5 (H) 4.6 - 6.5 %    Comment: Glycemic Control Guidelines for People with Diabetes:Non Diabetic:  <6%Goal of Therapy: <7%Additional Action Suggested:  >8%   TSH     Status: None   Collection Time: 05/03/18 11:19 AM  Result Value Ref Range   TSH 1.43 0.35 - 4.50 uIU/mL  CBC with Differential/Platelet     Status: Abnormal   Collection Time: 05/03/18 11:19 AM  Result Value Ref Range   WBC 5.4 4.0 - 10.5 K/uL   RBC 3.80 (L) 3.87 - 5.11 Mil/uL   Hemoglobin 13.0 12.0 - 15.0 g/dL   HCT 37.9 36.0 - 46.0 %   MCV 99.9 78.0 - 100.0 fl   MCHC 34.4 30.0 - 36.0 g/dL   RDW 13.8 11.5 - 15.5 %   Platelets 176.0 150.0 - 400.0 K/uL   Neutrophils Relative % 62.6 43.0 - 77.0 %   Lymphocytes Relative 29.0 12.0 - 46.0 %   Monocytes Relative 6.2 3.0 - 12.0 %   Eosinophils Relative 1.6 0.0 - 5.0 %   Basophils Relative 0.6 0.0 - 3.0 %   Neutro Abs 3.4 1.4 - 7.7 K/uL   Lymphs Abs 1.6 0.7 - 4.0 K/uL   Monocytes Absolute 0.3 0.1 - 1.0 K/uL   Eosinophils Absolute 0.1 0.0 - 0.7 K/uL   Basophils Absolute 0.0 0.0 - 0.1 K/uL  Comprehensive metabolic panel     Status: Abnormal   Collection Time: 05/03/18 11:19 AM  Result Value Ref Range   Sodium 139 135 - 145 mEq/L   Potassium 4.5 3.5 - 5.1 mEq/L   Chloride 102 96 - 112 mEq/L   CO2 30 19 - 32 mEq/L   Glucose, Bld 154 (H) 70 - 99 mg/dL   BUN 18 6 - 23 mg/dL   Creatinine, Ser 0.67 0.40 - 1.20 mg/dL   Total Bilirubin 0.7 0.2 - 1.2 mg/dL   Alkaline Phosphatase 54 39 - 117 U/L   AST 20 0 - 37 U/L   ALT 7 0 - 35 U/L   Total Protein 7.2 6.0 - 8.3 g/dL   Albumin 4.0 3.5 - 5.2 g/dL   Calcium 9.5 8.4 - 10.5 mg/dL   GFR 88.13 >60.00  mL/min   Objective  Body mass index is 29.12 kg/m. Wt Readings from Last 3 Encounters:  05/23/18 164 lb 6.4 oz (74.6 kg)  04/10/18 163 lb (73.9 kg)  03/26/18 161 lb (73 kg)   Temp Readings from Last 3 Encounters:  05/23/18 98.2 F (36.8 C) (Oral)  04/10/18 98 F (36.7 C) (Oral)  03/06/18 (!) 97 F (36.1 C)   BP Readings from Last 3 Encounters:  05/23/18 134/78  04/10/18 140/80  03/26/18 (!) 161/84   Pulse Readings from Last 3 Encounters:  05/23/18 70  04/10/18 78  03/26/18 85  Physical Exam Vitals signs and nursing note reviewed.  Constitutional:      Appearance: Normal appearance. She is well-developed, well-groomed and overweight.  HENT:     Head: Normocephalic and atraumatic.     Nose: Nose normal.     Mouth/Throat:     Mouth: Mucous membranes are moist.     Pharynx: Oropharynx is clear.  Eyes:     Conjunctiva/sclera: Conjunctivae normal.     Pupils: Pupils are equal, round, and reactive to light.  Cardiovascular:     Rate and Rhythm: Normal rate and regular rhythm.     Heart sounds: Normal heart sounds.  Pulmonary:     Effort: Pulmonary effort is normal.     Breath sounds: Normal breath sounds.  Skin:    General: Skin is warm and dry.  Neurological:     General: No focal deficit present.     Mental Status: She is alert and oriented to person, place, and time.     Gait: Gait normal.     Comments: Walking with cane today   Psychiatric:        Attention and Perception: Attention and perception normal.        Mood and Affect: Mood and affect normal.        Speech: Speech normal.        Behavior: Behavior normal. Behavior is cooperative.        Thought Content: Thought content normal.        Cognition and Memory: Cognition and memory normal.        Judgment: Judgment normal.     Assessment   1. Frequent falls and issues with balance  2. 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 HTN BP controlled today 4. Chronic  constipation/diarrhea/gas/bloating  5. HM Plan   1. Refer to PT for balance training/strengthening  Refer to Driver rehab services for driving test to see if able to drive  -handicap form filled out today 2. Cont meds and f/u with [facility] endocrine  Disc with cardiology how they feel about stopping ziac and change to low dose ARB with proteinuria  Pt declines statin  3. See #2 disc with Dr. Rockey Situ 4. F/u GI prn  5.  Flu shot 02/13/18  prevnar had 03/03/16  Check [facility] for pna 23had 2005 per pt stated had more recently at [employer] court advised to look for copy of proof if not will need pna 23  Tdap given utd  zostervax had  Consider shingrix future  Declines check MMR Reviewed B12 lab normal 957 06/28/17   Out of age window:  mammo Pap Colonoscopy  Declines DEXA  She is established with dermatology h/o [facility]. [facility]  05/01/2018 Dr. Roland Rack left CTS left f/u CTS release doing better   Provider: Dr. [name]-Internal Medicine

## 2018-05-23 NOTE — Progress Notes (Signed)
Pre visit review using our clinic review tool, if applicable. No additional management support is needed unless otherwise documented below in the visit note. 

## 2018-05-27 NOTE — Progress Notes (Signed)
Inform pt Dr. Rockey Situ would be ok to change her blood pressure medication around I would do a low dose beta blocker bisoprolol 2.5 mg daily she is on this now Stop HCTZ  Add low dose 1/2 pill of losartan or olmesartan/benicar with her history of diabetes   Does she want to make those changes?   [place]

## 2018-05-28 ENCOUNTER — Telehealth: Payer: Self-pay | Admitting: Urology

## 2018-05-29 NOTE — Telephone Encounter (Signed)
error 

## 2018-06-06 ENCOUNTER — Telehealth: Payer: Self-pay | Admitting: Cardiovascular Disease

## 2018-06-06 NOTE — Telephone Encounter (Signed)
Patient calling the office for samples of medication:   1.  What medication and dosage are you requesting samples for? Xarelto 20 MG - 1 tablet daily   2.  Are you currently out of this medication? No

## 2018-06-06 NOTE — Telephone Encounter (Signed)
Placed samples Xarelto 20 mg tablet  Lot:[id number] Exp:11/21

## 2018-06-10 ENCOUNTER — Telehealth: Payer: Self-pay | Admitting: Internal Medicine

## 2018-06-10 NOTE — Telephone Encounter (Signed)
Gave pt information about [employer] She is going to call them. She does not want to go to [facility].

## 2018-06-10 NOTE — Telephone Encounter (Signed)
Call pt she can arrange driving test at the [facility] or another option would be a place in [place]. [phone]   What would she like to do?   [place]

## 2018-06-17 ENCOUNTER — Telehealth: Payer: Self-pay | Admitting: Urology

## 2018-06-17 MED ORDER — MIRABEGRON ER 25 MG PO TB24: 25.0000 mg | ORAL_TABLET | Freq: Every day | ORAL | 6 refills | Status: DC

## 2018-06-17 NOTE — Telephone Encounter (Signed)
RX sent to pharmacy  

## 2018-06-17 NOTE — Telephone Encounter (Signed)
Pt tried samples of Myrbetriq 25 mg that [facility]ance gave her.  She has since seen [facility].  She would like an RX sent in to [facility] in [place].

## 2018-06-25 ENCOUNTER — Other Ambulatory Visit: Payer: Self-pay

## 2018-06-25 ENCOUNTER — Ambulatory Visit (INDEPENDENT_AMBULATORY_CARE_PROVIDER_SITE_OTHER): Payer: Medicare HMO | Admitting: Urology

## 2018-06-25 ENCOUNTER — Encounter: Payer: Self-pay | Admitting: Urology

## 2018-06-25 VITALS — BP 153/84 | HR 96 | Ht 63.0 in | Wt 168.7 lb

## 2018-06-25 DIAGNOSIS — R69 Illness, unspecified: Secondary | ICD-10-CM | POA: Diagnosis not present

## 2018-06-25 DIAGNOSIS — Z87440 Personal history of urinary (tract) infections: Secondary | ICD-10-CM | POA: Diagnosis not present

## 2018-06-25 DIAGNOSIS — N952 Postmenopausal atrophic vaginitis: Secondary | ICD-10-CM | POA: Diagnosis not present

## 2018-06-25 DIAGNOSIS — R351 Nocturia: Secondary | ICD-10-CM

## 2018-06-25 NOTE — Progress Notes (Signed)
06/25/2018 3:17 PM   Jodi Reeves 1927-10-13 [id number]  Referring provider: [name], Jodi Glow, MD [place], [place] [address]  Chief Complaint  Patient presents with  . Hematuria  . Urinary Frequency    HPI: Patient is a 85 year old Caucasian female with a history of hematuria, rUTI's and frequency who presents today for 3 month symptom recheck.  Background History: Patient is a 50 -year-old Caucasian female who is referred to [place] by Dr. Olivia Mackie Reeves for UTI and dysuria. Patient states that she has had 3 urinary tract infections over the last year. Reviewing her records,  she has had two positive urine cultures. + Klebsiella pneumoniae resistant to ampicillin on November 14, 2017 + E. coli resistant to ampicillin, cefazolin, ceftriaxone and cefuroxime on December 12, 2017 Her symptoms with a urinary tract infection consist of frequency, urgency, dysuria and gross hematuria. Yesterday, she states that she feels like she has a flu bug with malaise and nausea.  Patient denies any suprapubic/flank pain.  Patient denies any fevers, chills, nausea or vomiting. She is having frequency x not as much as at night, strong urgency, nocturia x q 1 hour and urge > stress incontinence x 1-2 liners.  Her UA today is positive for 3-10 RBC's.  Her PVR is 75 mL. She does have a history of nephrolithiasis that was passed spontaneously, no history of  GU surgery or GU trauma. She is not sexually active.  She is not postmenopausal. She admits to diarrhea > constipation. She does engage in good perineal hygiene. She does not take tub baths. She is drinking one to two glasses of water daily.   She has one or two cup of coffee daily.  She is also drinking a glass of cranberry daily.  She cannot drink a lot due to her reflux/esophagitis. She has a history of DM and ovarian cancer treated with radiation.     03/05/2018:  Recurrent UTI's: She notes that with her prior visit with the  Infectious Disease provider, Jodi Reeves, she still had questions remaining regarding that visit. She was informed that the goal would be to have her off of antibiotics. She has to wear panty liners daily. She wasn't started on any vaginal creams during that prior visit. Her stools are not really as formed as they could be at this time. She has an appointment with GI specialist, Jodi Reeves in the near future.    Hx of Gross Hematuria:  She was given 25 mg samples of Myrbetriq on 01/29/2018 by Jodi Reeves. She hasn't taken Myrbetriq due to having a lot of medications and not wanting to take another medication if she didn't have too. She is not a smoker.   CT Urogram on 01/21/2018 showed: A specific cause for the patient's recurrent urinary tract infections and hematuria is not identified. Slight bladder wall thickening may well be from nondistention on today's exam. There is a moderate-sized right bladder diverticulum and a smaller left bladder diverticulum. No urinary tract calculi are identified. Mild cardiomegaly with right atrial prominence. Coronary atherosclerosis. Aortic Atherosclerosis (ICD10-I70.0). Levoconvex lumbar scoliosis.   Cystoscopy performed by Jodi Reeves on 01/29/2018 showed no concerning findings on cystoscopy.   She notes that she has transportation issues as well.   Urinary frequency The patient on 03/26/2018 was experiencing urgency x 4-7, frequency x 4-7, not restricting fluids to avoid visits to the restroom, was engaging in toilet mapping, incontinence x 0-3 and nocturia x 4-7.   Her  PVR was 0 mL.  Her BP was 161/84.  Her most irritating symptom nocturia.   She drinks three 20 ounce bottles of water daily and stops at 7 pm.  Risk factors for nocturia: hypertension, diabetes, arthritis, heart disease and anxiety.   She has not had a sleep study.  She still has not taken the Myrbetriq samples.  She was dealing with diarrhea at this time and did not want to add another medication  into the mix.  Patient denied any gross hematuria, dysuria or suprapubic/flank pain.  Patient denied any fevers, chills, nausea or vomiting.   Today (06/25/2018) she reports she is still having diarrhea and it was particularly bad yesterday.  She has an appointment with her gastroenterologist.  She has tried liquid imodium at need; it has not helped very much, and she alternates between diarrhea and constipation.  Symptoms do not seem to have improved with dietary modifications, and it has been approximately 30 years, and is suspected to being due to radiation treatment for ovarian cancer.  She reports that she has tried the Myrbetriq samples, and did find it sometimes effective at reducing frequency of nocturia.  Patient reports that she feels tired all the time.  She reports she has not had diarrhea today, but has 'churned' and felt like she was going to.  She reports that it is just liquid.  She feels her GI symptoms and urinary symptoms may be linked.  PMH: Past Medical History:  Diagnosis Date  . Allergy   . Anxiety   . Balance disorder   . Chronic diastolic CHF (congestive heart failure) ([place])   . Colon polyps   . Coronary artery disease, non-occlusive    a. LHC 11/2003: 10% LAD stenosis, 20% LCx stenosis, and 40% RCA stenosis; b. nuc stress test 12/15: small region of mild ischemia in the apical region with WMA also noted in the apical region, EF 60%. She declined invasive evaluation at that time  . DM2 (diabetes mellitus, type 2) ([place])   . Fall    04/2017 see care[facility]where [facility] multiple fractures, brusing   . Falls frequently    h/o left shoudler/wrist fracture and left fingers numb  . GERD (gastroesophageal reflux disease)    esophageal web  . Granuloma annulare    Jodi Reeves. since 2010  . History of chicken pox   . History of eating disorder   . HLD (hyperlipidemia)   . HTN (hypertension)   . Hypothyroidism   . IBS (irritable bowel syndrome)    diarrhea   . Incontinence of  bowel   . Osteoporosis   . Ovarian cancer ([name])   . Persistent atrial fibrillation    a. CHADS2VASc = > 8 (CHF, HTN, age x 2, DM, TIA x 2, female); b. on Xarelto  . SBO (small bowel obstruction) ([place])    1998  . TIA (transient ischemic attack)   . UTI (urinary tract infection)   . Venous insufficiency     Surgical History: Past Surgical History:  Procedure Laterality Date  . 2nd look laparotomy  1982  . ABDOMINAL HYSTERECTOMY     for ovarian cancer s/p hysterectomy total 1976 and exp lap 1980  . APPENDECTOMY    . CARDIAC CATHETERIZATION  8/05   neg; a fib found   . CARPAL TUNNEL RELEASE Left 03/06/2018   Procedure: CARPAL TUNNEL RELEASE ENDOSCOPIC;  Surgeon: Corky Mull, MD;  Location: [facility];  Service: Orthopedics;  Laterality: Left;  diabetic - oral meds  .  cataract OD  2002  . cataract surgery  5/07   R  . CHOLECYSTECTOMY  1986  . DEXA  9/04 and 1/02  . EGD/dilation/colon  1/06  . ESOPHAGOGASTRODUODENOSCOPY (EGD) WITH PROPOFOL N/A 05/16/2016   Procedure: ESOPHAGOGASTRODUODENOSCOPY (EGD) WITH PROPOFOL with dilation;  Surgeon: Lucilla Lame, MD;  Location: [facility];  Service: Endoscopy;  Laterality: N/A;  . FEMORAL HERNIA REPAIR  1958   R  . fracture L elbow/wrist  2000  . intussception/obstruction  12/98  . JOINT REPLACEMENT     left shoulder  . kidney stone x2  1991  . laminectomy L4-5  1971  . LUMBAR LAMINECTOMY     1970 ruptured disc   . MOUTH SURGERY    . myoview stress  8/05   syncope (-)  . REVERSE SHOULDER ARTHROPLASTY Left 10/06/2014   Procedure: REVERSE SHOULDER ARTHROPLASTY;  Surgeon: Corky Mull, MD;  Location: [facility];  Service: Orthopedics;  Laterality: Left;  . stillbirth  1969  . TOTAL ABDOMINAL HYSTERECTOMY W/ BILATERAL SALPINGOOPHORECTOMY  1980   ovarian cancer  . WRIST FRACTURE SURGERY  11/05   R    Home Medications:  Allergies as of 06/25/2018      Reactions   Penicillins Anaphylaxis, Other (See Comments)   Has patient had  a PCN reaction causing immediate rash, facial/tongue/throat swelling, SOB or lightheadedness with hypotension: Yes Has patient had a PCN reaction causing severe rash involving mucus membranes or skin necrosis: No Has patient had a PCN reaction that required hospitalization: No Has patient had a PCN reaction occurring within the last 10 years: No If all of the above answers are "NO", then may proceed with Cephalosporin use.   Actos [pioglitazone]    Not effective    Amaryl [glimepiride]    Not effective    Amiodarone    Severe Thyroid issues    Atorvastatin Other (See Comments)   Muscle aches & All statins per Patient   Ciprofloxacin Other (See Comments)   High blood pressure, aches chest pressure    Codeine Nausea And Vomiting   Ezetimibe-simvastatin Other (See Comments)   Muscle aches, nausea, back pain    Fosamax [alendronate]    dysphagia   Lovastatin Other (See Comments)   Myalgias   Macrobid [nitrofurantoin Macrocrystal]    Severe Itching, rash   Protonix [pantoprazole Sodium]    Esophageal problems    Rosiglitazone Maleate Other (See Comments)   Edema   Statins    Muscle and joint aches to all statins   Sulfa Antibiotics    Itching   Victoza [liraglutide]    Nausea   Alendronate Sodium Rash   Dysphagia and ulceration    Bactrim [sulfamethoxazole-trimethoprim] Rash   itching      Medication List       Accurate as of June 25, 2018  3:17 PM. Always use your most recent med list.        acetaminophen 500 MG tablet Commonly known as:  TYLENOL Take 1,000 mg by mouth every 6 (six) hours as needed for moderate pain or headache.   bisoprolol-hydrochlorothiazide 2.5-6.25 MG tablet Commonly known as:  ZIAC TAKE 1 TABLET DAILY.   Calcium Citrate 200 MG Tabs Take by mouth daily.   CALCIUM COMPLEX PO Take 1 tablet by mouth daily.   co-enzyme Q-10 30 MG capsule Take 30 mg by mouth 3 (three) times daily.   docusate sodium 100 MG capsule Commonly known as:   COLACE Take 100 mg by mouth 2 (two)  times daily.   estradiol 0.1 MG/GM vaginal cream Commonly known as:  ESTRACE VAGINAL Apply 0.5mg  (pea-sized amount)  just inside the vaginal introitus with a finger-tip on Reeves, Wednesday and Friday nights.   FOLPLEX 2.2 2.2-25-0.5 MG Tabs Generic drug:  Folic Acid-Vit O3-ZCH Y85 Place under the tongue.   glucose blood test strip Commonly known as:  ONE TOUCH ULTRA TEST Use to test blood sugar once daily E11.49   levothyroxine 50 MCG tablet Commonly known as:  SYNTHROID, LEVOTHROID Take 50 mcg by mouth daily before breakfast.   metFORMIN 500 MG 24 hr tablet Commonly known as:  GLUCOPHAGE-XR 500 mg. 1 AM, 2 PM   mirabegron ER 25 MG Tb24 tablet Commonly known as:  MYRBETRIQ Take 1 tablet (25 mg total) by mouth daily.   NON FORMULARY Trivita Supplement B12 Takes qd. B6 qd, and folic acid   Omega-3 0277 MG Caps Take 2,000 mg by mouth daily.   Vitamin C Powd Take 200 mg by mouth daily.   XARELTO 20 MG Tabs tablet Generic drug:  rivaroxaban TAKE 1 TABLET DAILY.       Allergies:  Allergies  Allergen Reactions  . Penicillins Anaphylaxis and Other (See Comments)    Has patient had a PCN reaction causing immediate rash, facial/tongue/throat swelling, SOB or lightheadedness with hypotension: Yes Has patient had a PCN reaction causing severe rash involving mucus membranes or skin necrosis: No Has patient had a PCN reaction that required hospitalization: No Has patient had a PCN reaction occurring within the last 10 years: No If all of the above answers are "NO", then may proceed with Cephalosporin use.   . Actos [Pioglitazone]     Not effective   . Amaryl [Glimepiride]     Not effective    . Amiodarone     Severe Thyroid issues   . Atorvastatin Other (See Comments)    Muscle aches & All statins per Patient  . Ciprofloxacin Other (See Comments)    High blood pressure, aches chest pressure    . Codeine Nausea And Vomiting  .  Ezetimibe-Simvastatin Other (See Comments)    Muscle aches, nausea, back pain   . Fosamax [Alendronate]     dysphagia  . Lovastatin Other (See Comments)    Myalgias  . Macrobid [Nitrofurantoin Macrocrystal]     Severe Itching, rash   . Protonix [Pantoprazole Sodium]     Esophageal problems    . Rosiglitazone Maleate Other (See Comments)    Edema  . Statins     Muscle and joint aches to all statins  . Sulfa Antibiotics     Itching   . Victoza [Liraglutide]     Nausea   . Alendronate Sodium Rash    Dysphagia and ulceration   . Bactrim [Sulfamethoxazole-Trimethoprim] Rash    itching    Family History: Family History  Problem Relation Age of Onset  . Early death Father   . Diabetes Mother   . Heart disease Mother   . Arthritis Brother   . Cancer Brother   . Depression Brother   . Hearing loss Brother   . Arthritis Brother   . Cancer Brother        colon  . Hearing loss Brother   . Cancer Brother   . Diabetes Brother   . Hearing loss Brother   . Heart disease Brother   . Hyperlipidemia Brother   . Heart disease Sister   . Hypertension Sister   . Stroke Sister   .  Hearing loss Daughter   . Hypertension Daughter   . Diabetes Paternal Grandfather   . Hearing loss Sister   . Heart disease Sister   . Arthritis Sister   . Depression Sister   . Hearing loss Sister   . Heart disease Sister   . COPD Brother   . Early death Brother   . Early death Brother     Social History:  reports that she has never smoked. She has never used smokeless tobacco. She reports that she does not drink alcohol or use drugs.  ROS: UROLOGY Frequent Urination?: No Hard to postpone urination?: Yes Burning/pain with urination?: No Get up at night to urinate?: Yes Leakage of urine?: No Urine stream starts and stops?: No Trouble starting stream?: No Do you have to strain to urinate?: No Blood in urine?: No Urinary tract infection?: No Sexually transmitted disease?: No Injury to  kidneys or bladder?: No Painful intercourse?: No Weak stream?: No Currently pregnant?: No Vaginal bleeding?: No Last menstrual period?: n  Gastrointestinal Nausea?: No Vomiting?: No Indigestion/heartburn?: No Diarrhea?: Yes Constipation?: Yes  Constitutional Fever: No Night sweats?: No Weight loss?: No Fatigue?: Yes  Skin Skin rash/lesions?: No Itching?: No  Eyes Blurred vision?: No Double vision?: No  Ears/Nose/Throat Sore throat?: No Sinus problems?: Yes  Hematologic/Lymphatic Swollen glands?: Yes Easy bruising?: No  Cardiovascular Leg swelling?: Yes Chest pain?: No  Respiratory Cough?: No Shortness of breath?: No  Endocrine Excessive thirst?: No  Musculoskeletal Back pain?: Yes Joint pain?: Yes  Neurological Headaches?: No Dizziness?: No  Psychologic Depression?: No Anxiety?: No  Physical Exam: BP (!) 153/84   Pulse 96   Ht 5\' 3"  (1.6 m)   Wt 168 lb 11.2 oz (76.5 kg)   SpO2 96%   BMI 29.88 kg/m   Constitutional: Well nourished. Alert and oriented, No acute distress. Cardiovascular: No clubbing, cyanosis, or edema. Respiratory: Normal respiratory effort, no increased work of breathing. GU: No CVA tenderness.  No bladder fullness or masses.  Atrophic external genitalia, sparce pubic hair distribution, no lesions.  Normal urethral meatus, no lesions, no prolapse, no discharge.  Urethral caruncle present.  No bladder fullness, tenderness or masses.  Pale vagina mucosa, good estrogen effect, no discharge, no lesions, fair pelvic support, grade 1 cystocele and no rectocele noted.  Cervix, uterus, and andex are surgically absent.  Anus and perineum are without rashes or lesions. Skin: No rashes, bruises or suspicious lesions. Lymph: No inguinal adenopathy. Neurologic: Grossly intact, no focal deficits, moving all 4 extremities. Psychiatric: Normal mood and affect.   Laboratory Data: Lab Results  Component Value Date   WBC 5.4 05/03/2018    HGB 13.0 05/03/2018   HCT 37.9 05/03/2018   MCV 99.9 05/03/2018   PLT 176.0 05/03/2018    Lab Results  Component Value Date   CREATININE 0.67 05/03/2018   Lab Results  Component Value Date   HGBA1C 7.5 (H) 05/03/2018    Lab Results  Component Value Date   TSH 1.43 05/03/2018       Component Value Date/Time   CHOL 183 05/03/2018 1119   CHOL 187 11/17/2011 0849   HDL 66.90 05/03/2018 1119   HDL 56 11/17/2011 0849   CHOLHDL 3 05/03/2018 1119   VLDL 17.4 05/03/2018 1119   LDLCALC 99 05/03/2018 1119   LDLCALC 104 (H) 11/17/2011 0849    Lab Results  Component Value Date   AST 20 05/03/2018   Lab Results  Component Value Date   ALT 7 05/03/2018  I have reviewed the labs.  Pertinent Imaging:  Results for JEANY, SEVILLE (MRN [id number]) as of 03/26/2018 15:50  Ref. Range 03/26/2018 15:37  Scan Result Unknown 0 ml   Assessment & Plan:    1. Nocturia - Reduction in nocturia on Myrbetriq; she has a perscription now - She is stopping fluids prior to bedtime - Patient reports improvement on Myrbetriq and now has a perscription - Recommended taking it before bed since nocturia is most bothersome symptom - RTC in 1 year with OAB questionnaire and PVR  2. rUTI's - Has been evaluated by Infectious Disease and they have recommended starting vaginal estrogen cream, urodynamics, and ensuring patient empties her bladder.  - Urodynamic study deferred due to the patients transportation issues.  - PVRs minimal in the office.  - She has continued the vaginal estrogen cream (Estrace)  3. History of hematuria - Hematuria work up completed in 01/29/2018 - with no concerning findings on cystoscopy.  - No report of gross hematuria  - RTC in one year for UA - patient to report any gross hematuria in the interim                                          Return in about 1 year (around 06/25/2019) for OAB questionnaire, PVR and exam.  Laneta Simmers  [address] [place], [place] [address] [phone]  I, Adele Schilder, am acting as a Education administrator for [employer], PA-C.   I have reviewed the above documentation for accuracy and completeness, and I agree with the above.    Zara Council, PA-C

## 2018-07-04 DIAGNOSIS — E1169 Type 2 diabetes mellitus with other specified complication: Secondary | ICD-10-CM | POA: Diagnosis not present

## 2018-07-04 DIAGNOSIS — I10 Essential (primary) hypertension: Secondary | ICD-10-CM | POA: Diagnosis not present

## 2018-07-04 DIAGNOSIS — E1159 Type 2 diabetes mellitus with other circulatory complications: Secondary | ICD-10-CM | POA: Diagnosis not present

## 2018-07-04 DIAGNOSIS — E039 Hypothyroidism, unspecified: Secondary | ICD-10-CM | POA: Diagnosis not present

## 2018-07-04 DIAGNOSIS — E785 Hyperlipidemia, unspecified: Secondary | ICD-10-CM | POA: Diagnosis not present

## 2018-07-04 DIAGNOSIS — E1142 Type 2 diabetes mellitus with diabetic polyneuropathy: Secondary | ICD-10-CM | POA: Diagnosis not present

## 2018-07-08 ENCOUNTER — Telehealth: Payer: Self-pay

## 2018-07-08 NOTE — Telephone Encounter (Signed)
Copied from [place] [id number]. Topic: General - Inquiry >> Jul 08, 2018  2:17 PM Alanda Slim E wrote: Reason for CRM: Pt bought in a form from [facility] to explain a billing issue and why it was Billed wrong / Pt dropped off forms to front desk and she has been waiting to hear back about the bill and received another bill from [facility] for past due balance of about $123 for vaccinations that was told to her had to be done in the pharmacy.Pt already spoke with billing dept and told them to call the office/ Please advise

## 2018-07-08 NOTE — Telephone Encounter (Signed)
I spoke with the patient her bill is still pending with insurance.

## 2018-07-29 ENCOUNTER — Telehealth: Payer: Self-pay

## 2018-07-29 NOTE — Telephone Encounter (Signed)
Virtual Visit Pre-Appointment Phone Call  Steps For Call:  1. Confirm consent - "In the setting of the current Covid19 crisis, you are scheduled for a TELEPHONE visit with your provider on 08/21/2018 at 3:40PM.  Just as we do with many in-office visits, in order for you to participate in this visit, we must obtain consent.  If you'd like, I can send this to your mychart (if signed up) or email for you to review.  Otherwise, I can obtain your verbal consent now.  All virtual visits are billed to your insurance company just like a normal visit would be.  By agreeing to a virtual visit, we'd like you to understand that the technology does not allow for your provider to perform an examination, and thus may limit your provider's ability to fully assess your condition.  Finally, though the technology is pretty good, we cannot assure that it will always work on either your or our end, and in the setting of a video visit, we may have to convert it to a phone-only visit.  In either situation, we cannot ensure that we have a secure connection.  Are you willing to proceed?"  2. Give patient instructions for WebEx download to smartphone as below if video visit  3. Advise patient to be prepared with any vital sign or heart rhythm information, their current medicines, and a piece of paper and pen handy for any instructions they may receive the day of their visit  4. Inform patient they will receive a phone call 15 minutes prior to their appointment time (may be from unknown caller ID) so they should be prepared to answer  5. Confirm that appointment type is correct in Epic appointment notes (video vs telephone)    TELEPHONE CALL NOTE  Jodi Reeves has been deemed a candidate for a follow-up tele-health visit to limit community exposure during the Covid-19 pandemic. I spoke with the patient via phone to ensure availability of phone/video source, confirm preferred email & phone number, and discuss  instructions and expectations.  I reminded Jodi Reeves to be prepared with any vital sign and/or heart rhythm information that could potentially be obtained via home monitoring, at the time of her visit. I reminded Jodi Reeves to expect a phone call at the time of her visit if her visit.  Did the patient verbally acknowledge consent to treatment? YES  Jodi Reeves, [place] 07/29/2018 3:58 PM   DOWNLOADING THE [place], go to [employer] and type in WebEx in the search bar. [place] [employer], the blue/green circle. The app is free but as with any other app downloads, their phone may require them to verify saved payment information or Apple password. The patient does NOT have to create an account.  - If Android, ask patient to go to [employer] and type in WebEx in the search bar. [place] [employer], the blue/green circle. The app is free but as with any other app downloads, their phone may require them to verify saved payment information or Android password. The patient does NOT have to create an account.   CONSENT FOR TELE-HEALTH VISIT - PLEASE REVIEW  I hereby voluntarily request, consent and authorize [facility] and its employed or contracted physicians, physician assistants, nurse practitioners or other licensed health care professionals (the Practitioner), to provide me with telemedicine health care services (the "Services") as deemed necessary by the treating Practitioner. I acknowledge  and consent to receive the Services by the Practitioner via telemedicine. I understand that the telemedicine visit will involve communicating with the Practitioner through live audiovisual communication technology and the disclosure of certain medical information by electronic transmission. I acknowledge that I have been given the opportunity to request an in-person assessment or other available alternative prior to the telemedicine visit  and am voluntarily participating in the telemedicine visit.  I understand that I have the right to withhold or withdraw my consent to the use of telemedicine in the course of my care at any time, without affecting my right to future care or treatment, and that the Practitioner or I may terminate the telemedicine visit at any time. I understand that I have the right to inspect all information obtained and/or recorded in the course of the telemedicine visit and may receive copies of available information for a reasonable fee.  I understand that some of the potential risks of receiving the Services via telemedicine include:  Marland Kitchen Delay or interruption in medical evaluation due to technological equipment failure or disruption; . Information transmitted may not be sufficient (e.g. poor resolution of images) to allow for appropriate medical decision making by the Practitioner; and/or  . In rare instances, security protocols could fail, causing a breach of personal health information.  Furthermore, I acknowledge that it is my responsibility to provide information about my medical history, conditions and care that is complete and accurate to the best of my ability. I acknowledge that Practitioner's advice, recommendations, and/or decision may be based on factors not within their control, such as incomplete or inaccurate data provided by me or distortions of diagnostic images or specimens that may result from electronic transmissions. I understand that the practice of medicine is not an exact science and that Practitioner makes no warranties or guarantees regarding treatment outcomes. I acknowledge that I will receive a copy of this consent concurrently upon execution via email to the email address I last provided but may also request a printed copy by calling the office of [place].    I understand that my insurance will be billed for this visit.   I have read or had this consent read to me. . I understand  the contents of this consent, which adequately explains the benefits and risks of the Services being provided via telemedicine.  . I have been provided ample opportunity to ask questions regarding this consent and the Services and have had my questions answered to my satisfaction. . I give my informed consent for the services to be provided through the use of telemedicine in my medical care  By participating in this telemedicine visit I agree to the above.

## 2018-08-11 DIAGNOSIS — R69 Illness, unspecified: Secondary | ICD-10-CM | POA: Diagnosis not present

## 2018-08-12 ENCOUNTER — Ambulatory Visit: Payer: Medicare HMO | Admitting: Podiatry

## 2018-08-20 NOTE — Progress Notes (Signed)
Virtual Visit via Telephone Note   This visit type was conducted due to national recommendations for restrictions regarding the COVID-19 Pandemic (e.g. social distancing) in an effort to limit this patient's exposure and mitigate transmission in our community.  Due to her co-morbid illnesses, this patient is at least at moderate risk for complications without adequate follow up.  This format is felt to be most appropriate for this patient at this time.  The patient did not have access to video technology/had technical difficulties with video requiring transitioning to audio format only (telephone).  All issues noted in this document were discussed and addressed.  No physical exam could be performed with this format.  Please refer to the patient's chart for her  consent to telehealth for [facility].   I connected with  Jodi Reeves on 08/21/18 by a video enabled telemedicine application and verified that I am speaking with the correct person using two identifiers. I discussed the limitations of evaluation and management by telemedicine. The patient expressed understanding and agreed to proceed.   Evaluation Performed:  Follow-up visit  Date:  08/21/2018   ID:  Jodi Reeves, DOB 1927/09/20, MRN [id number]  Patient Location:  [address] [place] [address]   Provider location:   [facility], [place] office  PCP:  [name], Nino Glow, MD  Cardiologist:  Patsy Baltimore  Chief Complaint:  Leg swelling   History of Present Illness:    Jodi Reeves is a 85 y.o. female who presents via audio/video conferencing for a telehealth visit today.   The patient does not symptoms concerning for COVID-19 infection (fever, chills, cough, or new SHORTNESS OF BREATH).   Patient has a past medical history of atrial fibrillation since 2005, on xarelto diabetes,  history of "flame hemorrhages" in her eyes,  workup including echocardiography  cardiac catheterization  Showing 40% RCA disease.  CT chest 04/20/2016, Calcifications of the aortic arch, mild calcification of the coronary arteries. Moderate calcifications of the abdominal aorta, which is tortuous. Previous stretching of esophagus with Dr. 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 She presents for follow-up of her atrial fibrillation and chest pain  Long discussion with her, numerous issues discussed prior fall Walking with support She is concerned that her gait does not seem to get better  Son does groceries Denies any recent falls  Chronic lower extremity edema, sometimes uses compression hose, stable   Pressures low at home, 614 systolic on regular basis No orthostasis sx  For chest pain Outpatient stress test low risk no ischemia  followed by endocrine  Labs reviewed HBA1C 7.5  Declined cholesterol medication in the past Still on Xarelto  Other past medical hx Previous  falls , Went to [facility], , Sacral fx Left face bruising, severe, Xarelto held Did not qualify for placement/SNF   Chronic GI issues Previously had diarrhea, now with constipation. Working with GI  Went to the ER 04/19/2016,  complaints of Chest pain, difficulty swallowing  sounded more esophageal per the notes several days of difficulty with swallowing food and medicine.  Her troponins returned negative, her EKGs with no changes CT scan of the chest performed, limited echocardiogram performed On day of discharge, she ate normal food and had no difficulty with dysphagia or pain. She was discharged home.   TIA in January 2017, went to [facility] for evaluation She was talking with a friend, had word finding difficulty, friend was  finishing her sentences Workup at [facility] reviewed She had CT scan of her head which was essentially normal,   Previoustotal cholesterol 201, LDL 107, hemoglobin A1c 6.9  Previous mechanical fall at church while she was hanging up her coat in 2015,  She fell backwards, hit her head, was transferred to [facility] While in the emergency room she had chest pain, had head CT scan, MRI, cardiac enzymes which were normal 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    Prior CV studies:   The following studies were reviewed today:  Results of ER echo (limited) 04/19/2016 No sonographic evidence of significant cardiac dysfunction, No sonographic  evidence of significant pericardial effusion, Normal RV, No sonographic  evidence of volume depletion and Pericardial effusion:with a SMALL  Effusion  carotid ultrasound that showed mild bilateral disease, no significant stenoses,  echocardiogram with ejection fraction greater than 65%, mild valve abnormality  CT chest 04/20/2016: Calcifications of the aortic arch. mild calcification of the coronary arteries. Moderate calcifications of the abdominal aorta, which is tortuous. The origins of the celiac, SMA, single bilateral renal arteries are patent. The origin of the IMA is not seen, and may be occluded chronically.  CT scan chest March 18, 2017 with cardiomegaly and coronary artery calcification, aortic atherosclerosis    Past Medical History:  Diagnosis Date  . Allergy   . Anxiety   . Balance disorder   . Chronic diastolic CHF (congestive heart failure) ([place])   . Colon polyps   . Coronary artery disease, non-occlusive    a. LHC 11/2003: 10% LAD stenosis, 20% LCx stenosis, and 40% RCA stenosis; b. nuc stress test 12/15: small region of mild ischemia in the apical region with WMA also noted in the apical region, EF 60%. She declined invasive evaluation at that time  . DM2 (diabetes mellitus, type 2) ([place])   . Fall    04/2017 see careeverywhere [facility] multiple fractures, brusing   . Falls frequently    h/o left shoudler/wrist fracture and left fingers numb  . GERD (gastroesophageal reflux  disease)    esophageal web  . 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 History of chicken pox   . History of eating disorder   . HLD (hyperlipidemia)   . HTN (hypertension)   . Hypothyroidism   . IBS (irritable bowel syndrome)    diarrhea   . Incontinence of bowel   . Osteoporosis   . Ovarian cancer ([place])   . Persistent atrial fibrillation    a. CHADS2VASc = > 8 (CHF, HTN, age x 2, DM, TIA x 2, female); b. on Xarelto  . SBO (small bowel obstruction) ([place])    1998  . TIA (transient ischemic attack)   . UTI (urinary tract infection)   . Venous insufficiency    Past Surgical History:  Procedure Laterality Date  . 2nd look laparotomy  1982  . ABDOMINAL HYSTERECTOMY     for ovarian cancer s/p hysterectomy total 1976 and exp lap 1980  . APPENDECTOMY    . CARDIAC CATHETERIZATION  8/05   neg; a fib found   . CARPAL TUNNEL RELEASE Left 03/06/2018   Procedure: CARPAL TUNNEL RELEASE ENDOSCOPIC;  Surgeon: Corky Mull, MD;  Location: [facility];  Service: Orthopedics;  Laterality: Left;  diabetic - oral meds  . cataract OD  2002  . cataract surgery  5/07  R  . CHOLECYSTECTOMY  1986  . DEXA  9/04 and 1/02  . EGD/dilation/colon  1/06  . ESOPHAGOGASTRODUODENOSCOPY (EGD) WITH PROPOFOL N/A 05/16/2016   Procedure: ESOPHAGOGASTRODUODENOSCOPY (EGD) WITH PROPOFOL with dilation;  Surgeon: Lucilla Lame, MD;  Location: [facility] ENDOSCOPY;  Service: Endoscopy;  Laterality: N/A;  . FEMORAL HERNIA REPAIR  1958   R  . fracture L elbow/wrist  2000  . intussception/obstruction  12/98  . JOINT REPLACEMENT     left shoulder  . kidney stone x2  1991  . laminectomy L4-5  1971  . LUMBAR LAMINECTOMY     1970 ruptured disc   . MOUTH SURGERY    . myoview stress  8/05   syncope (-)  . REVERSE SHOULDER ARTHROPLASTY Left 10/06/2014   Procedure: REVERSE SHOULDER ARTHROPLASTY;  Surgeon: Corky Mull, MD;  Location: [facility] ORS;  Service: Orthopedics;  Laterality: Left;  . stillbirth  1969  .  TOTAL ABDOMINAL HYSTERECTOMY W/ BILATERAL SALPINGOOPHORECTOMY  1980   ovarian cancer  . WRIST FRACTURE SURGERY  11/05   R     Current Meds  Medication Sig  . acetaminophen (TYLENOL) 500 MG tablet Take 1,000 mg by mouth every 6 (six) hours as needed for moderate pain or headache.  . Ascorbic Acid (VITAMIN C) POWD Take 200 mg by mouth daily.   . bisoprolol-hydrochlorothiazide (ZIAC) 2.5-6.25 MG tablet TAKE 1 TABLET DAILY.  . Calcium Citrate 200 MG TABS Take by mouth daily.  Marland Kitchen co-enzyme Q-10 30 MG capsule Take 30 mg by mouth 3 (three) times daily.  Marland Kitchen docusate sodium (COLACE) 100 MG capsule Take 100 mg by mouth 2 (two) times daily.  Marland Kitchen estradiol (ESTRACE VAGINAL) 0.1 MG/GM vaginal cream Apply 0.5mg  (pea-sized amount)  just inside the vaginal introitus with a finger-tip on Monday, Wednesday and Friday nights.  . Folic Acid-Vit D6-LOV F64 (FOLPLEX 2.2) 2.2-25-0.5 MG TABS Place under the tongue.  Marland Kitchen glucose blood (ONE TOUCH ULTRA TEST) test strip Use to test blood sugar once daily E11.49 (Patient taking differently: Use to test blood sugar twice daily E11.49)  . levothyroxine (SYNTHROID, LEVOTHROID) 50 MCG tablet Take 50 mcg by mouth daily before breakfast.  . metFORMIN (GLUCOPHAGE-XR) 500 MG 24 hr tablet 500 mg. 1 AM, 2 PM  . mirabegron ER (MYRBETRIQ) 25 MG TB24 tablet Take 1 tablet (25 mg total) by mouth daily.  . Multiple Vitamin (CALCIUM COMPLEX PO) Take 1 tablet by mouth daily.  . NON FORMULARY [place]ita Supplement B12 Takes qd. B6 qd, and folic acid  . Omega-3 1000 MG CAPS Take 2,000 mg by mouth daily.   Alveda Reasons 20 MG TABS tablet TAKE 1 TABLET DAILY.     Allergies:   Penicillins; Actos [pioglitazone]; Amaryl [glimepiride]; Amiodarone; Atorvastatin; Ciprofloxacin; Codeine; Ezetimibe-simvastatin; Fosamax [alendronate]; Lovastatin; Macrobid [nitrofurantoin macrocrystal]; Protonix [pantoprazole sodium]; Rosiglitazone maleate; Statins; Sulfa antibiotics; Victoza [liraglutide]; Alendronate sodium;  and Bactrim [sulfamethoxazole-trimethoprim]   Social History   Tobacco Use  . Smoking status: Never Smoker  . Smokeless tobacco: Never Used  Substance Use Topics  . Alcohol use: No    Comment: may have occasional glass of wine  . Drug use: No     Current Outpatient Medications on File Prior to Visit  Medication Sig Dispense Refill  . acetaminophen (TYLENOL) 500 MG tablet Take 1,000 mg by mouth every 6 (six) hours as needed for moderate pain or headache.    . Ascorbic Acid (VITAMIN C) POWD Take 200 mg by mouth daily.     . bisoprolol-hydrochlorothiazide [facility])  2.5-6.25 MG tablet TAKE 1 TABLET DAILY. 30 tablet 6  . Calcium Citrate 200 MG TABS Take by mouth daily.    Marland Kitchen co-enzyme Q-10 30 MG capsule Take 30 mg by mouth 3 (three) times daily.    Marland Kitchen docusate sodium (COLACE) 100 MG capsule Take 100 mg by mouth 2 (two) times daily.    Marland Kitchen estradiol (ESTRACE VAGINAL) 0.1 MG/GM vaginal cream Apply 0.5mg  (pea-sized amount)  just inside the vaginal introitus with a finger-tip on Monday, Wednesday and Friday nights. 30 g 12  . Folic Acid-Vit N3-ZJQ B34 (FOLPLEX 2.2) 2.2-25-0.5 MG TABS Place under the tongue.    Marland Kitchen glucose blood (ONE TOUCH ULTRA TEST) test strip Use to test blood sugar once daily E11.49 (Patient taking differently: Use to test blood sugar twice daily E11.49) 100 each 1  . levothyroxine (SYNTHROID, LEVOTHROID) 50 MCG tablet Take 50 mcg by mouth daily before breakfast.    . metFORMIN (GLUCOPHAGE-XR) 500 MG 24 hr tablet 500 mg. 1 AM, 2 PM    . mirabegron ER (MYRBETRIQ) 25 MG TB24 tablet Take 1 tablet (25 mg total) by mouth daily. 30 tablet 6  . Multiple Vitamin (CALCIUM COMPLEX PO) Take 1 tablet by mouth daily.    . NON FORMULARY Trivita Supplement B12 Takes qd. B6 qd, and folic acid    . Omega-3 1000 MG CAPS Take 2,000 mg by mouth daily.     Alveda Reasons 20 MG TABS tablet TAKE 1 TABLET DAILY. 30 tablet 4   No current facility-administered medications on file prior to visit.      Family  Hx: The patient's family history includes Arthritis in her brother, brother, and sister; COPD in her brother; Cancer in her brother, brother, and brother; Depression in her brother and sister; Diabetes in her brother, mother, and paternal grandfather; Early death in her brother, brother, and father; Hearing loss in her brother, brother, brother, daughter, sister, and sister; Heart disease in her brother, mother, sister, sister, and sister; Hyperlipidemia in her brother; Hypertension in her daughter and sister; Stroke in her sister.  ROS:   Please see the history of present illness.    Review of Systems  Constitutional: Negative.   HENT: Negative.   Respiratory: Negative.   Cardiovascular: Negative.   Gastrointestinal: Negative.   Musculoskeletal: Positive for falls.       Gait instability  Neurological: Negative.   Psychiatric/Behavioral: Negative.   All other systems reviewed and are negative.    Labs/Other Tests and Data Reviewed:    Recent Labs: 05/03/2018: ALT 7; BUN 18; Creatinine, Ser 0.67; Hemoglobin 13.0; Platelets 176.0; Potassium 4.5; Sodium 139; TSH 1.43   Recent Lipid Panel Lab Results  Component Value Date/Time   CHOL 183 05/03/2018 11:19 AM   CHOL 187 11/17/2011 08:49 AM   TRIG 87.0 05/03/2018 11:19 AM   HDL 66.90 05/03/2018 11:19 AM   HDL 56 11/17/2011 08:49 AM   CHOLHDL 3 05/03/2018 11:19 AM   LDLCALC 99 05/03/2018 11:19 AM   LDLCALC 104 (H) 11/17/2011 08:49 AM   LDLDIRECT 130.3 11/27/2008 12:50 PM    Wt Readings from Last 3 Encounters:  06/25/18 168 lb 11.2 oz (76.5 kg)  05/23/18 164 lb 6.4 oz (74.6 kg)  04/10/18 163 lb (73.9 kg)     Exam:    Vital Signs: Vital signs may also be detailed in the HPI There were no vitals taken for this visit.  Wt Readings from Last 3 Encounters:  06/25/18 168 lb 11.2 oz (76.5 kg)  05/23/18 164 lb 6.4 oz (74.6 kg)  04/10/18 163 lb (73.9 kg)   Temp Readings from Last 3 Encounters:  05/23/18 98.2 F (36.8 C) (Oral)   04/10/18 98 F (36.7 C) (Oral)  03/06/18 (!) 97 F (36.1 C)   BP Readings from Last 3 Encounters:  06/25/18 (!) 153/84  05/23/18 134/78  04/10/18 140/80   Pulse Readings from Last 3 Encounters:  06/25/18 96  05/23/18 70  04/10/18 78    110/70, pulse 70, resp 16  Well nourished, well developed female in no acute distress. Constitutional:  oriented to person, place, and time. No distress.     ASSESSMENT & PLAN:    Persistent atrial fibrillation Tolerating anticoagulation Rate well controlled  Chronic diastolic CHF (congestive heart failure) (HCC) Euvolemic, Will add losartan 25 mg daily  TIA (transient ischemic attack) On anticoagulation  Mixed hyperlipidemia Declined statin in the past  Aortic atherosclerosis ([place]) She does not want a statin  Essential hypertension We will hold her bisoprolol HCTZ She does report systolic pressures typically 110 or less at home Reports that she has spreadsheets of daily blood pressures None typically higher than 115 If blood pressure at home runs high after medication changes we could always add back the bisoprolol HCT  Coronary artery disease, non-occlusive Currently with no symptoms of angina. No further workup at this time. Continue current medication regimen.  Type 2 diabetes mellitus with neurological manifestations, controlled ([place]) Notes indicating proteinuria Discussed various medication options We will add losartan 25 mg daily  Chest pain, unspecified type Prior history atypical chest pain No further work-up at this time  COVID-19 Education: The signs and symptoms of COVID-19 were discussed with the patient and how to seek care for testing (follow up with PCP or arrange E-visit).  The importance of social distancing was discussed today.  Patient Risk:   After full review of this patients clinical status, I feel that they are at least moderate risk at this time.  Time:   Today, I have spent 45 minutes with  the patient with telehealth technology discussing the cardiac and medical problems/diagnoses detailed above   10 min spent reviewing the chart prior to patient visit today   Medication Adjustments/Labs and Tests Ordered: Current medicines are reviewed at length with the patient today.  Concerns regarding medicines are outlined above.   Tests Ordered: No tests ordered   Medication Changes: No changes made   Disposition: Follow-up in 12 months   Signed, Ida Rogue, MD  08/21/2018 4:05 PM    [place] Office [address] [place] [address], [place], [place] [address]

## 2018-08-21 ENCOUNTER — Other Ambulatory Visit: Payer: Self-pay

## 2018-08-21 ENCOUNTER — Telehealth (INDEPENDENT_AMBULATORY_CARE_PROVIDER_SITE_OTHER): Payer: Medicare HMO | Admitting: Cardiovascular Disease

## 2018-08-21 DIAGNOSIS — R079 Chest pain, unspecified: Secondary | ICD-10-CM

## 2018-08-21 DIAGNOSIS — E1149 Type 2 diabetes mellitus with other diabetic neurological complication: Secondary | ICD-10-CM | POA: Diagnosis not present

## 2018-08-21 DIAGNOSIS — I4819 Other persistent atrial fibrillation: Secondary | ICD-10-CM

## 2018-08-21 DIAGNOSIS — I2510 Atherosclerotic heart disease of native coronary artery without angina pectoris: Secondary | ICD-10-CM | POA: Diagnosis not present

## 2018-08-21 DIAGNOSIS — I700 Atherosclerosis of aorta: Secondary | ICD-10-CM

## 2018-08-21 DIAGNOSIS — E782 Mixed hyperlipidemia: Secondary | ICD-10-CM

## 2018-08-21 DIAGNOSIS — I10 Essential (primary) hypertension: Secondary | ICD-10-CM | POA: Diagnosis not present

## 2018-08-21 DIAGNOSIS — I5032 Chronic diastolic (congestive) heart failure: Secondary | ICD-10-CM | POA: Diagnosis not present

## 2018-08-21 DIAGNOSIS — G459 Transient cerebral ischemic attack, unspecified: Secondary | ICD-10-CM

## 2018-08-21 MED ORDER — LOSARTAN POTASSIUM 25 MG PO TABS: 25.0000 mg | ORAL_TABLET | Freq: Every day | ORAL | 3 refills | Status: DC

## 2018-08-21 NOTE — Patient Instructions (Addendum)
Medication Instructions:  Your physician has recommended you make the following change in your medication:  1. START Losartan 25 mg once daily 2. STOP Bisoprolol-Hydrochlorothiazide   If you need a refill on your cardiac medications before your next appointment, please call your pharmacy.    Lab work: No new labs needed   If you have labs (blood work) drawn today and your tests are completely normal, you will receive your results only by: Marland Kitchen MyChart Message (if you have MyChart) OR . A paper copy in the mail If you have any lab test that is abnormal or we need to change your treatment, we will call you to review the results.   Testing/Procedures: No new testing needed   Follow-Up: At [facility], you and your health needs are our priority.  As part of our continuing mission to provide you with exceptional heart care, we have created designated Provider Care Teams.  These Care Teams include your primary Cardiologist (physician) and Advanced Practice Providers (APPs -  Physician Assistants and Nurse Practitioners) who all work together to provide you with the care you need, when you need it.  . You will need a follow up appointment in 12 months .   Please call our office 2 months in advance to schedule this appointment.    . Providers on your designated Care Team:   . Murray Hodgkins, NP . Christell Faith, PA-C . Marrianne Mood, PA-C  Any Other Special Instructions Will Be Listed Below (If Applicable).  For educational health videos Log in to : [email or web address] Or : [email or web address], password : [email or web address]

## 2018-08-26 ENCOUNTER — Ambulatory Visit: Payer: Medicare HMO | Admitting: Podiatry

## 2018-09-02 ENCOUNTER — Telehealth: Payer: Self-pay | Admitting: Cardiovascular Disease

## 2018-09-02 NOTE — Telephone Encounter (Signed)
Spoke with patient. 4/29- Telephone visit with Dr Rockey Situ - Stopped bisoprolol.HCTZ, started losartan 25 mg daily.  5/4 - patient started losartan 5/7 115/54, 106 5/8 112/64, 96 5/9 121/66,  98 5/10 117/70, 99 5/11 124/72, 93  For about 3 days she had nausea throughout the day, which has subsided.  Other issue is itching around wrists and neck. No rash or raised bumps noted. She takes BP prior to taking losartan. Asked her to take about 2 hours after as well. She remembers Dr Rockey Situ said she might need to take lower dose. Has also noticed her HR is higher.  Routing to Dr Rockey Situ to review.

## 2018-09-02 NOTE — Telephone Encounter (Signed)
Please call regarding Losartan doseage.  Calling with her HR readings.: 5/11- 93  5/10- 99 5/9- 98 5/8-96 5/7- 106

## 2018-09-03 NOTE — Telephone Encounter (Signed)
Would recommend bisoprolol 5 mg daily (no HCTZ), will help the heart rate Could decrease the losartan down to 12.5 mg daily, lower dose so blood pressure does not drop too low

## 2018-09-04 DIAGNOSIS — L97829 Non-pressure chronic ulcer of other part of left lower leg with unspecified severity: Secondary | ICD-10-CM | POA: Diagnosis not present

## 2018-09-04 MED ORDER — BISOPROLOL FUMARATE 5 MG PO TABS: 5.0000 mg | ORAL_TABLET | Freq: Every day | ORAL | 3 refills | Status: DC

## 2018-09-04 MED ORDER — LOSARTAN POTASSIUM 25 MG PO TABS: 12.5000 mg | ORAL_TABLET | Freq: Every day | ORAL | Status: DC

## 2018-09-04 NOTE — Telephone Encounter (Addendum)
Spoke with the pt and made her aware of Dr. Donivan Scull recommendation. Pt rqst an Rx for Bisoprolol 5mg  daily be sent to her pharmacy. Pt med list updated. Adv the pt to continue to monitor her BP, she should contact the office if her BP/HR is consistently low or high. Pt verbalized understanding to the instruction given and voiced appreciation for the call.

## 2018-09-24 ENCOUNTER — Other Ambulatory Visit: Payer: Self-pay

## 2018-09-24 ENCOUNTER — Ambulatory Visit (INDEPENDENT_AMBULATORY_CARE_PROVIDER_SITE_OTHER): Payer: Medicare HMO | Admitting: Internal Medicine

## 2018-09-24 DIAGNOSIS — K5900 Constipation, unspecified: Secondary | ICD-10-CM

## 2018-09-24 DIAGNOSIS — E1142 Type 2 diabetes mellitus with diabetic polyneuropathy: Secondary | ICD-10-CM

## 2018-09-24 DIAGNOSIS — R109 Unspecified abdominal pain: Secondary | ICD-10-CM

## 2018-09-24 DIAGNOSIS — N3281 Overactive bladder: Secondary | ICD-10-CM | POA: Diagnosis not present

## 2018-09-24 DIAGNOSIS — Z87440 Personal history of urinary (tract) infections: Secondary | ICD-10-CM

## 2018-09-24 DIAGNOSIS — R197 Diarrhea, unspecified: Secondary | ICD-10-CM

## 2018-09-24 DIAGNOSIS — E039 Hypothyroidism, unspecified: Secondary | ICD-10-CM

## 2018-09-24 DIAGNOSIS — S81802D Unspecified open wound, left lower leg, subsequent encounter: Secondary | ICD-10-CM | POA: Diagnosis not present

## 2018-09-24 DIAGNOSIS — M545 Low back pain, unspecified: Secondary | ICD-10-CM | POA: Insufficient documentation

## 2018-09-24 DIAGNOSIS — K068 Other specified disorders of gingiva and edentulous alveolar ridge: Secondary | ICD-10-CM

## 2018-09-24 DIAGNOSIS — I10 Essential (primary) hypertension: Secondary | ICD-10-CM

## 2018-09-24 DIAGNOSIS — R600 Localized edema: Secondary | ICD-10-CM | POA: Diagnosis not present

## 2018-09-24 DIAGNOSIS — E1149 Type 2 diabetes mellitus with other diabetic neurological complication: Secondary | ICD-10-CM | POA: Diagnosis not present

## 2018-09-24 DIAGNOSIS — E782 Mixed hyperlipidemia: Secondary | ICD-10-CM | POA: Diagnosis not present

## 2018-09-24 DIAGNOSIS — R140 Abdominal distension (gaseous): Secondary | ICD-10-CM

## 2018-09-24 NOTE — Progress Notes (Signed)
Telephone Note  I connected with Jodi Reeves   on 09/24/18 at  1:40 PM EDT by telephone and verified that I am speaking with the correct person using two identifiers.  Location patient: home Location provider:work  Persons participating in the virtual visit: patient, provider  I discussed the limitations of evaluation and management by telemedicine and the availability of in person appointments. The patient expressed understanding and agreed to proceed.   HPI: 1. DM 2 A1C 7.5 05/03/2018 she had f/u with [facility] endocrine 07/04/18 and they want to f/u with her in 6 months on metformin XR 1 am and 2 pm she reports her cbgs at night after meals have been 308 at 10 pm and 177 and 236 at 9:55 pm and 190 at 10:50 pm and she barely eats  -denies statin   2. H/o chronic UTI/overactive bladder she reports small stream of urine comes and and it sprays and she thinks this is a GI/GU issue f/u with urology is not scheduled until 06/2019. She is not having any uti sx's today  -myrbetriq 25 mg which she does not take daily causes nausea  3. She c/o bleeding gums   4. C/o LBP she thinks its MSK due to doing work around the house  01/21/18 on CT hematuria w/u  Levoconvex lumbar scoliosis. Lumbar spondylosis and degenerative disc disease but without a significant degree of impingement identified.  5. Lymphedema legs swollen and lumpy and discolored. She has not tolerated compression stockings in the past too tight.   6. Wound to leg she saw dermatology virtually the other day she reports wound still not healed but appears like a slit in the skin and has had 1 year as of 10/2018. Dermatology tells her the wound will heal -we disc'ed today a vascular consult to help with leg edema and wound healing I.e unna boot but she wants to wait due to COVID  7. She reports she is still having balance issues, reduced strength and energy   8. C/o chronic abdominal pain with bloating and gas and constipation, diarrhea and  stool incontinence she wants to f/u with [name] and will call him to schedule appt.   9. Hypothyroidism on levo 50 mcg   ROS: See pertinent positives and negatives per HPI. General: weight gain of 10 lbs she is now of lasix and hctz   Past Medical History:  Diagnosis Date  . Allergy   . Anxiety   . Balance disorder   . Chronic diastolic CHF (congestive heart failure) ([place])   . Colon polyps   . Coronary artery disease, non-occlusive    a. LHC 11/2003: 10% LAD stenosis, 20% LCx stenosis, and 40% RCA stenosis; b. nuc stress test 12/15: small region of mild ischemia in the apical region with WMA also noted in the apical region, EF 60%. She declined invasive evaluation at that time  . DM2 (diabetes mellitus, type 2) ([place])   . Fall    04/2017 see careeverywhere [facility] multiple fractures, brusing   . Falls frequently    h/o left shoudler/wrist fracture and left fingers numb  . GERD (gastroesophageal reflux disease)    esophageal web  . 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 History of chicken pox   . History of eating disorder   . HLD (hyperlipidemia)   . HTN (hypertension)   . Hypothyroidism   . IBS (irritable bowel syndrome)    diarrhea   . Incontinence of bowel   .  Osteoporosis   . Ovarian cancer ([place])   . Persistent atrial fibrillation    a. CHADS2VASc = > 8 (CHF, HTN, age x 2, DM, TIA x 2, female); b. on Xarelto  . SBO (small bowel obstruction) ([place])    1998  . TIA (transient ischemic attack)   . UTI (urinary tract infection)   . Venous insufficiency     Past Surgical History:  Procedure Laterality Date  . 2nd look laparotomy  1982  . ABDOMINAL HYSTERECTOMY     for ovarian cancer s/p hysterectomy total 1976 and exp lap 1980  . APPENDECTOMY    . CARDIAC CATHETERIZATION  8/05   neg; a fib found   . CARPAL TUNNEL RELEASE Left 03/06/2018   Procedure: CARPAL TUNNEL RELEASE ENDOSCOPIC;  Surgeon: Corky Mull, MD;  Location: [place];  Service:  Orthopedics;  Laterality: Left;  diabetic - oral meds  . cataract OD  2002  . cataract surgery  5/07   R  . CHOLECYSTECTOMY  1986  . DEXA  9/04 and 1/02  . EGD/dilation/colon  1/06  . ESOPHAGOGASTRODUODENOSCOPY (EGD) WITH PROPOFOL N/A 05/16/2016   Procedure: ESOPHAGOGASTRODUODENOSCOPY (EGD) WITH PROPOFOL with dilation;  Surgeon: Lucilla Lame, MD;  Location: [facility];  Service: Endoscopy;  Laterality: N/A;  . FEMORAL HERNIA REPAIR  1958   R  . fracture L elbow/wrist  2000  . intussception/obstruction  12/98  . JOINT REPLACEMENT     left shoulder  . kidney stone x2  1991  . laminectomy L4-5  1971  . LUMBAR LAMINECTOMY     1970 ruptured disc   . MOUTH SURGERY    . myoview stress  8/05   syncope (-)  . REVERSE SHOULDER ARTHROPLASTY Left 10/06/2014   Procedure: REVERSE SHOULDER ARTHROPLASTY;  Surgeon: Corky Mull, MD;  Location: [facility];  Service: Orthopedics;  Laterality: Left;  . stillbirth  1969  . TOTAL ABDOMINAL HYSTERECTOMY W/ BILATERAL SALPINGOOPHORECTOMY  1980   ovarian cancer  . WRIST FRACTURE SURGERY  11/05   R    Family History  Problem Relation Age of Onset  . Early death Father   . Diabetes Mother   . Heart disease Mother   . Arthritis Brother   . Cancer Brother   . Depression Brother   . Hearing loss Brother   . Arthritis Brother   . Cancer Brother        colon  . Hearing loss Brother   . Cancer Brother   . Diabetes Brother   . Hearing loss Brother   . Heart disease Brother   . Hyperlipidemia Brother   . Heart disease Sister   . Hypertension Sister   . Stroke Sister   . Hearing loss Daughter   . Hypertension Daughter   . Diabetes Paternal Grandfather   . Hearing loss Sister   . Heart disease Sister   . Arthritis Sister   . Depression Sister   . Hearing loss Sister   . Heart disease Sister   . COPD Brother   . Early death Brother   . 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  acetaminophen (TYLENOL) 500 MG tablet, Take 1,000 mg by mouth every 6 (six)  hours as needed for moderate pain or headache., Disp: , Rfl:  .  Ascorbic Acid (VITAMIN C) POWD, Take 200 mg by mouth daily. , Disp: , Rfl:  .  bisoprolol (ZEBETA) 5 MG tablet, Take 1 tablet (5 mg total) by mouth daily., Disp: 90 tablet, Rfl: 3 .  Calcium Citrate 200 MG TABS, Take by mouth daily., Disp: , Rfl:  .  co-enzyme Q-10 30 MG capsule, Take 30 mg by mouth 3 (three) times daily., Disp: , Rfl:  .  docusate sodium (COLACE) 100 MG capsule, Take 100 mg by mouth 2 (two) times daily., Disp: , Rfl:  .  estradiol (ESTRACE VAGINAL) 0.1 MG/GM vaginal cream, Apply 0.56m (pea-sized amount)  just inside the vaginal introitus with a finger-tip on Monday, Wednesday and Friday nights., Disp: 30 g, Rfl: 12 .  Folic Acid-Vit [id number](FOLPLEX 2.2) 2.2-25-0.5 MG TABS, Place under the tongue., Disp: , Rfl:  .  glucose blood (ONE TOUCH ULTRA TEST) test strip, Use to test blood sugar once daily E11.49 (Patient taking differently: Use to test blood sugar twice daily E11.49), Disp: 100 each, Rfl: 1 .  levothyroxine (SYNTHROID, LEVOTHROID) 50 MCG tablet, Take 50 mcg by mouth daily before breakfast., Disp: , Rfl:  .  losartan (COZAAR) 25 MG tablet, Take 0.5 tablets (12.5 mg total) by mouth daily., Disp: , Rfl:  .  metFORMIN (GLUCOPHAGE-XR) 500 MG 24 hr tablet, 500 mg. 1 AM, 2 PM, Disp: , Rfl:  .  mirabegron ER (MYRBETRIQ) 25 MG TB24 tablet, Take 1 tablet (25 mg total) by mouth daily., Disp: 30 tablet, Rfl: 6 .  Multiple Vitamin (CALCIUM COMPLEX PO), Take 1 tablet by mouth daily., Disp: , Rfl:  .  NON FORMULARY, [place] Supplement B12 Takes qd. B6 qd, and folic acid, Disp: , Rfl:  .  Omega-3 1000 MG CAPS, Take 2,000 mg by mouth daily. ,  Disp: , Rfl:  .  XARELTO 20 MG TABS tablet, TAKE 1 TABLET DAILY., Disp: 30 tablet, Rfl: 4  EXAM:  VITALS per patient if applicable:  GENERAL: alert, oriented, appears well and in no acute distress  PSYCH/NEURO: pleasant and cooperative, no obvious depression or anxiety, speech and thought processing grossly intact, very talkative   ASSESSMENT AND PLAN:  Discussed the following assessment and plan:  1. Hypothyroidism, unspecified type-on levo 50 mcg check TSH, Cmet, cbc, A1c in future   2. Type 2 diabetes mellitus with neurological manifestations, controlled ([place] A1C 7.5 05/03/2018 Mixed hyperlipidemia -for #1 and 2 f/u [facility] due to f/u 12/2018 Dr. OHonor Junes -pt prev declined statin Urine utd, saw podiatry 05/13/18 Dr. HMilinda Pointer will ask about eye exam in future   3. Essential hypertension -on losartan 25 mg qd and zebeta 5 mg qd  -if bp low normal per cards cut losartan in 1/2  -she is now off hctz and lasix which were deemed not to be effective and risks >benefits for leg edema in the past   4. Lymphedema with Wound of left lower extremity it will be 1 year as of 10/2018 -disc with pt rec [facility] f/u for unna boot and wound care in future pt declines for now -she is also f/u with dermatology and had virtual visit recently Dr. IKellie Moor  Echo 04/28/2015   Left ventricular hypertrophy - mild  Normal left ventricular systolic function, ejection fraction 65 to 764% Diastolic dysfunction - grade II (elevated filling pressures)  Dilated left atrium - moderate  Degenerative mitral valve disease  Mitral  annular calcification  Aortic sclerosis  Elevated pulmonary artery systolic pressure - borderline  Normal right ventricular systolic function  Tricuspid regurgitation - mild to moderate  Dilated right atrium - mild  --> will disc with cards prn lasix daily prn with h/o grade 2 diastolic issues  --> she may also need repeat echo in future    5. Overactive  bladder History of UTI -f/u BUA in future  -denies UTI sx's for now and prev pt told by urology if not having sx's dont check urine culture  -monitor for sx's of UTI  6. Diarrhea, constipation, abd pain and bloating, chronic  -pt will call and f/u with [name]   7. Gums, bleeding -likely needs to f/u dental   8. Low back pain,chronic  01/21/18 on CT hematuria w/u  Levoconvex lumbar scoliosis. Lumbar spondylosis and degenerative disc disease but without a significant degree of impingement identified. -prn Tylenol    9. HM Flu shot 02/13/18 prevnar had 03/03/16  Check [facility] for pna 23had 2005 per pt stated [facility]advised to look for copy of proof if not will need pna 23 Tdap utd  zostervax had  Consider shingrix future  Declines check MMR Reviewed B12 lab normal 957 06/28/17  Out of age window:  mammo Pap s/p hysterectomy  Colonoscopy  Declines DEXA  She is established with dermatology h/o [facility]. Istenstein now [facility] left leg  05/01/2018 Dr. Roland Rack left CTS left f/u CTS release doing better  cards Dr. Rockey Situ due to f/u 07/2019  GI-Dr.Wohl  Endocrine [facility] Dr. Honor Junes    I discussed the assessment and treatment plan with the patient. The patient was provided an opportunity to ask questions and all were answered. The patient agreed with the plan and demonstrated an understanding of the instructions.   The patient was advised to call back or seek an in-person evaluation if the symptoms worsen or if the condition fails to improve as anticipated.  Time spent 25 minutes  Delorise Jackson, MD

## 2018-09-24 NOTE — Progress Notes (Signed)
Pre visit review using our clinic review tool, if applicable. No additional management support is needed unless otherwise documented below in the visit note. 

## 2018-09-25 ENCOUNTER — Other Ambulatory Visit: Payer: Self-pay | Admitting: *Deleted

## 2018-09-25 MED ORDER — RIVAROXABAN 20 MG PO TABS: 20.0000 mg | ORAL_TABLET | Freq: Every day | ORAL | 6 refills | Status: DC

## 2018-09-25 NOTE — Progress Notes (Signed)
Refill sent in per provider request. Patient has been getting samples here in the office.

## 2018-09-27 DIAGNOSIS — R69 Illness, unspecified: Secondary | ICD-10-CM | POA: Diagnosis not present

## 2018-09-30 ENCOUNTER — Telehealth: Payer: Self-pay | Admitting: Internal Medicine

## 2018-09-30 NOTE — Telephone Encounter (Signed)
Spoke with Dr. Rockey Situ about weight gain and leg swelling  He wants to try Lasix 20 mg 2-3 x per week so maybe on MWF as needed  And get an echo scheduled of her heart  Is she agreeable?

## 2018-09-30 NOTE — Telephone Encounter (Signed)
-----   Message from Minna Merritts, MD sent at 09/29/2018  8:07 PM EDT ----- Regarding: lasix Sure, we could try lasix PRN Pressures low in the past, perhaps 2-3 times a week max to start? Could also consider an echo, don't see one on record.  Pam, can we see if she would be willing to start lasix 20 mg po PRN 2 to 3 times a week for weight gain and leg swelling? May also benefit from an echo for weight gain and leg swelling  Thx TGollan  ----- Message ----- From: [name], Nino Glow, MD Sent: 09/24/2018   5:54 PM EDT To: Minna Merritts, MD  Hi Dr. Rockey Situ  I saw this mutual pt virtually today she c/o leg edema which seems like she has chronic lymphedema based on chart review but also c/o 10 lb weight gain with hctz in bisoprolol just stopped months ago   Her last echo 04/28/2015  Echo 04/28/2015   Left ventricular hypertrophy - mild  Normal left ventricular systolic function, ejection fraction 65 to 83%  Diastolic dysfunction - grade II (elevated filling pressures)  Dilated left atrium - moderate  Degenerative mitral valve disease  Mitral annular calcification  Aortic sclerosis  Elevated pulmonary artery systolic pressure - borderline  Normal right ventricular systolic function  Tricuspid regurgitation - mild to moderate  Dilated right atrium - mild  Do you think a prn lasix 20 mg would help ?   Thanks [employer]

## 2018-10-01 ENCOUNTER — Telehealth: Payer: Self-pay | Admitting: Cardiovascular Disease

## 2018-10-01 DIAGNOSIS — I5032 Chronic diastolic (congestive) heart failure: Secondary | ICD-10-CM

## 2018-10-01 NOTE — Telephone Encounter (Signed)
Spoke with patient and she states that she is just not wanting to restart the lasix at this time. She feels that nothing is new with her legs. She has not been sleeping well, fatigue, no appetite, and she is very concerned. She spoke at length about several concerns and wanted to hold off on the fluid pill for now. Recommended that she try getting the echo done for now and then we can readdress the fluid pill at that time. She was agreeable with this plan and had no further questions at this time.

## 2018-10-01 NOTE — Telephone Encounter (Signed)
Ok with the lasix. Ok with heart echo.

## 2018-10-01 NOTE — Telephone Encounter (Signed)
-----   Message from Minna Merritts, MD sent at 09/29/2018  8:07 PM EDT ----- Regarding: lasix Sure, we could try lasix PRN Pressures low in the past, perhaps 2-3 times a week max to start? Could also consider an echo, don't see one on record.  Pam, can we see if she would be willing to start lasix 20 mg po PRN 2 to 3 times a week for weight gain and leg swelling? May also benefit from an echo for weight gain and leg swelling  Thx TGollan  ----- Message ----- From: [name], Nino Glow, MD Sent: 09/24/2018   5:54 PM EDT To: Minna Merritts, MD  Hi Dr. Rockey Situ  I saw this mutual pt virtually today she c/o leg edema which seems like she has chronic lymphedema based on chart review but also c/o 10 lb weight gain with hctz in bisoprolol just stopped months ago   Her last echo 04/28/2015  Echo 04/28/2015   Left ventricular hypertrophy - mild  Normal left ventricular systolic function, ejection fraction 65 to 79%  Diastolic dysfunction - grade II (elevated filling pressures)  Dilated left atrium - moderate  Degenerative mitral valve disease  Mitral annular calcification  Aortic sclerosis  Elevated pulmonary artery systolic pressure - borderline  Normal right ventricular systolic function  Tricuspid regurgitation - mild to moderate  Dilated right atrium - mild  Do you think a prn lasix 20 mg would help ?   Thanks [employer]

## 2018-10-01 NOTE — Telephone Encounter (Signed)
Patient has been having some difficulties with weight gain and swelling, just not feeling well PCP wants to start lasix but patient is concerned due to being up at night with urgency for bathroom already  Patient has also been adjusting losartan but today her BP was higher 151/80 Wants to discuss with Dr Donivan Scull office about options, please call

## 2018-10-02 NOTE — Telephone Encounter (Signed)
Scheduled closing encounter.

## 2018-10-07 ENCOUNTER — Ambulatory Visit (INDEPENDENT_AMBULATORY_CARE_PROVIDER_SITE_OTHER): Payer: Medicare HMO | Admitting: Gastroenterology

## 2018-10-07 ENCOUNTER — Encounter: Payer: Self-pay | Admitting: Gastroenterology

## 2018-10-07 ENCOUNTER — Other Ambulatory Visit: Payer: Self-pay

## 2018-10-07 DIAGNOSIS — R194 Change in bowel habit: Secondary | ICD-10-CM

## 2018-10-07 NOTE — Progress Notes (Signed)
Jodi Lame, Jodi Reeves [address]  [place]  [place], [place] [address]  Main: [phone]  Fax: [phone]    Gastroenterology telephone Visit  Referring Provider:     [name], Jodi Reeves * Primary Care Physician:  Jodi Reeves, Jodi Glow, Jodi Reeves Primary Gastroenterologist:  Dr.Lashe Oliveira [name] Reason for Consultation:     Diarrhea and constipation        HPI:    Virtual Visit via Video Note Location of the patient: Home. Location of provider: Office  Participating persons: The patient myself and Jodi Reeves.  I connected with Jodi Reeves on 10/07/18 at  2:30 PM EDT by a telephone and verified that I am speaking with the correct person using two identifiers.   I discussed the limitations of evaluation and management by telemedicine and the availability of in person appointments. The patient expressed understanding and agreed to proceed.  Verbal consent to proceed obtained.  History of Present Illness: Jodi Reeves is a 85 y.o. female referred by Dr. Terese Door, Jodi Glow, Jodi Reeves  for alternating diarrhea and constipation.  The patient did not feel comfortable with coming to the office during this pandemic and set up a telemedicine visit.  The patient reports that since seeing me she has tried liquid Imodium and states that when she takes 3 drops as she describes it she then will have constipation for a week.  She is not sure with 3 drops means but she thinks it is 3 lines that she has on the measuring.  The patient now reports that the diarrhea is not as prevalent as her constipation is although she is having both.  She states that the fiber has not helped her much and she takes it intermittently but she does take prune juice and then reports that after taking prune juice for her constipation she gets diarrhea.  There is no report of any black stools or bloody stools.  The patient has had a colonoscopy in the past by another gastroenterologist for work-up of this without  any colitis seen.  Past Medical History:  Diagnosis Date   Allergy    Anxiety    Balance disorder    Chronic diastolic CHF (congestive heart failure) (HCC)    Colon polyps    Coronary artery disease, non-occlusive    a. LHC 11/2003: 10% LAD stenosis, 20% LCx stenosis, and 40% RCA stenosis; b. nuc stress test 12/15: small region of mild ischemia in the apical region with WMA also noted in the apical region, EF 60%. She declined invasive evaluation at that time   DM2 (diabetes mellitus, type 2) ([place])    Fall    04/2017 see care[facility]where [facility] multiple fractures, brusing    Falls frequently    h/o left shoudler/wrist fracture and left fingers numb   GERD (gastroesophageal reflux disease)    esophageal web   Granuloma annulare    [name]. since 2010   History of chicken pox    History of eating disorder    HLD (hyperlipidemia)    HTN (hypertension)    Hypothyroidism    IBS (irritable bowel syndrome)    diarrhea    Incontinence of bowel    Osteoporosis    Ovarian cancer ([place])    Persistent atrial fibrillation    a. CHADS2VASc = > 8 (CHF, HTN, age x 2, DM, TIA x 2, female); b. on Xarelto   SBO (small bowel obstruction) ([place])    1998   TIA (transient ischemic attack)  UTI (urinary tract infection)    Venous insufficiency     Past Surgical History:  Procedure Laterality Date   2nd look laparotomy  1982   ABDOMINAL HYSTERECTOMY     for ovarian cancer s/p hysterectomy total 1976 and exp lap [place]  8/05   neg; a fib found    CARPAL TUNNEL RELEASE Left 03/06/2018   Procedure: CARPAL TUNNEL RELEASE ENDOSCOPIC;  Surgeon: Corky Mull, Jodi Reeves;  Location: [facility];  Service: Orthopedics;  Laterality: Left;  diabetic - oral meds   cataract OD  2002   cataract surgery  5/07   R   CHOLECYSTECTOMY  1986   DEXA  9/04 and 1/02   EGD/dilation/colon  1/06   ESOPHAGOGASTRODUODENOSCOPY (EGD) WITH  PROPOFOL N/A 05/16/2016   Procedure: ESOPHAGOGASTRODUODENOSCOPY (EGD) WITH PROPOFOL with dilation;  Surgeon: Jodi Lame, Jodi Reeves;  Location: [facility];  Service: Endoscopy;  Laterality: N/A;   FEMORAL HERNIA REPAIR  1958   R   fracture L elbow/wrist  2000   intussception/obstruction  12/98   JOINT REPLACEMENT     left shoulder   kidney stone x2  1991   laminectomy L4-5  1971   LUMBAR LAMINECTOMY     1970 ruptured disc    MOUTH SURGERY     myoview stress  8/05   syncope (-)   REVERSE SHOULDER ARTHROPLASTY Left 10/06/2014   Procedure: REVERSE SHOULDER ARTHROPLASTY;  Surgeon: Corky Mull, Jodi Reeves;  Location: [facility];  Service: Orthopedics;  Laterality: Left;   stillbirth  1969   TOTAL ABDOMINAL HYSTERECTOMY W/ BILATERAL SALPINGOOPHORECTOMY  1980   ovarian cancer   WRIST FRACTURE SURGERY  11/05   R    Prior to Admission medications   Medication Sig Start Date End Date Taking? Authorizing Provider  acetaminophen (TYLENOL) 500 MG tablet Take 1,000 mg by mouth every 6 (six) hours as needed for moderate pain or headache.    Provider, Historical, Jodi Reeves  Ascorbic Acid (VITAMIN C) POWD Take 200 mg by mouth daily.     Provider, Historical, Jodi Reeves  bisoprolol (ZEBETA) 5 MG tablet Take 1 tablet (5 mg total) by mouth daily. 09/04/18   Jodi Merritts, Jodi Reeves  Calcium Citrate 200 MG TABS Take by mouth daily.    Provider, Historical, Jodi Reeves  co-enzyme Q-10 30 MG capsule Take 30 mg by mouth 3 (three) times daily.    Provider, Historical, Jodi Reeves  docusate sodium (COLACE) 100 MG capsule Take 100 mg by mouth 2 (two) times daily.    Provider, Historical, Jodi Reeves  estradiol (ESTRACE VAGINAL) 0.1 MG/GM vaginal cream Apply 0.5mg  (pea-sized amount)  just inside the vaginal introitus with a finger-tip on Reeves, Wednesday and Friday nights. 03/05/18   Zara Council A, PA-C  Folic Acid-Vit D2-KGU R42 (FOLPLEX 2.2) 2.2-25-0.5 MG TABS Place under the tongue.    Provider, Historical, Jodi Reeves  glucose blood (ONE TOUCH ULTRA TEST)  test strip Use to test blood sugar once daily E11.49 Patient taking differently: Use to test blood sugar twice daily E11.49 06/24/15   Bedsole, Amy E, Jodi Reeves  levothyroxine (SYNTHROID, LEVOTHROID) 50 MCG tablet Take 50 mcg by mouth daily before breakfast.    Provider, Historical, Jodi Reeves  losartan (COZAAR) 25 MG tablet Take 0.5 tablets (12.5 mg total) by mouth daily. 09/04/18 12/03/18  Jodi Merritts, Jodi Reeves  metFORMIN (GLUCOPHAGE-XR) 500 MG 24 hr tablet 500 mg. 1 AM, 2 PM 11/15/17   Provider, Historical, Jodi Reeves  mirabegron ER (MYRBETRIQ) 25  MG TB24 tablet Take 1 tablet (25 mg total) by mouth daily. 06/17/18   Billey Co, Jodi Reeves  Multiple Vitamin (CALCIUM COMPLEX PO) Take 1 tablet by mouth daily.    Provider, Historical, Jodi Reeves  NON FORMULARY Trivita Supplement B12 Takes qd. B6 qd, and folic acid    Provider, Historical, Jodi Reeves  Omega-3 1000 MG CAPS Take 2,000 mg by mouth daily.     Provider, Historical, Jodi Reeves  rivaroxaban (XARELTO) 20 MG TABS tablet Take 1 tablet (20 mg total) by mouth daily with supper. 09/25/18   Jodi Merritts, Jodi Reeves    Family History  Problem Relation Age of Onset   Early death Father    Diabetes Mother    Heart disease Mother    Arthritis Brother    Cancer Brother    Depression Brother    Hearing loss Brother    Arthritis Brother    Cancer Brother        colon   Hearing loss Brother    Cancer Brother    Diabetes Brother    Hearing loss Brother    Heart disease Brother    Hyperlipidemia Brother    Heart disease Sister    Hypertension Sister    Stroke Sister    Hearing loss Daughter    Hypertension Daughter    Diabetes Paternal Grandfather    Hearing loss Sister    Heart disease Sister    Arthritis Sister    Depression Sister    Hearing loss Sister    Heart disease Sister    COPD Brother    Early death Brother    Early death Brother      Social History   Tobacco Use   Smoking status: Never Smoker   Smokeless tobacco: Never Used  Substance Use  Topics   Alcohol use: No    Comment: may have occasional glass of wine   Drug use: No    Allergies as of 10/07/2018 - Review Complete 09/24/2018  Allergen Reaction Noted   Penicillins Anaphylaxis and Other (See Comments)    Actos [pioglitazone]  08/21/2017   Amaryl [glimepiride]  08/21/2017   Amiodarone  05/10/2017   Atorvastatin Other (See Comments)    Ciprofloxacin Other (See Comments) 09/17/2015   Codeine Nausea And Vomiting    Ezetimibe-simvastatin Other (See Comments)    Fosamax [alendronate]  08/21/2017   Lovastatin Other (See Comments)    Macrobid [nitrofurantoin macrocrystal]  10/12/2017   Protonix [pantoprazole sodium]  10/12/2017   Rosiglitazone maleate Other (See Comments)    Statins  05/10/2017   Sulfa antibiotics  10/12/2017   Victoza [liraglutide]  10/12/2017   Alendronate sodium Rash    Bactrim [sulfamethoxazole-trimethoprim] Rash 09/17/2015    Review of Systems:    All systems reviewed and negative except where noted in HPI.   Observations/Objective:  Labs: CBC    Component Value Date/Time   WBC 5.4 05/03/2018 1119   RBC 3.80 (L) 05/03/2018 1119   HGB 13.0 05/03/2018 1119   HCT 37.9 05/03/2018 1119   PLT 176.0 05/03/2018 1119   MCV 99.9 05/03/2018 1119   MCH 34.6 (H) 03/18/2017 0408   MCHC 34.4 05/03/2018 1119   RDW 13.8 05/03/2018 1119   LYMPHSABS 1.6 05/03/2018 1119   MONOABS 0.3 05/03/2018 1119   EOSABS 0.1 05/03/2018 1119   BASOSABS 0.0 05/03/2018 1119   CMP     Component Value Date/Time   NA 139 05/03/2018 1119   K 4.5 05/03/2018 1119   CL 102 05/03/2018  1119   CO2 30 05/03/2018 1119   GLUCOSE 154 (H) 05/03/2018 1119   BUN 18 05/03/2018 1119   BUN 13 01/07/2018 1342   CREATININE 0.67 05/03/2018 1119   CALCIUM 9.5 05/03/2018 1119   PROT 7.2 05/03/2018 1119   PROT 6.8 11/17/2011 0849   ALBUMIN 4.0 05/03/2018 1119   ALBUMIN 4.1 11/17/2011 0849   AST 20 05/03/2018 1119   ALT 7 05/03/2018 1119   ALKPHOS 54  05/03/2018 1119   BILITOT 0.7 05/03/2018 1119   GFRNONAA 78 01/07/2018 1342   GFRAA 90 01/07/2018 1342    Imaging Studies: No results found.  Assessment and Plan:   AMAZIAH GHOSH is a 85 y.o. y/o female following up for alternating diarrhea and constipation.  The patient has been told to again titrate her laxatives and antidiarrheal medications so that she is more balanced.  The patient has been told to continue her fiber and to decrease the amount of prune juice she is taking so that she does not end up with diarrhea as she has been doing recently.  The patient is also concerned about multiple other problems she is having including frequent urination.  She states that she has been followed by Dr. Rockey Situ and cardiology.  The patient will contact me if her symptoms do not improve.  Follow Up Instructions:  I discussed the assessment and treatment plan with the patient. The patient was provided an opportunity to ask questions and all were answered. The patient agreed with the plan and demonstrated an understanding of the instructions.   The patient was advised to call back or seek an in-person evaluation if the symptoms worsen or if the condition fails to improve as anticipated.  I provided 20 minutes of non-face-to-face time during this encounter.   Jodi Lame, Jodi Reeves  Speech recognition software was used to dictate the above note.

## 2018-10-23 ENCOUNTER — Telehealth: Payer: Self-pay

## 2018-10-23 NOTE — Telephone Encounter (Signed)

## 2018-10-24 ENCOUNTER — Ambulatory Visit (INDEPENDENT_AMBULATORY_CARE_PROVIDER_SITE_OTHER): Payer: Medicare HMO

## 2018-10-24 ENCOUNTER — Ambulatory Visit (INDEPENDENT_AMBULATORY_CARE_PROVIDER_SITE_OTHER): Payer: Medicare HMO | Admitting: Cardiovascular Disease

## 2018-10-24 ENCOUNTER — Other Ambulatory Visit: Payer: Self-pay

## 2018-10-24 ENCOUNTER — Encounter: Payer: Self-pay | Admitting: Cardiovascular Disease

## 2018-10-24 VITALS — BP 153/79 | HR 88 | Temp 97.0°F | Ht 63.0 in | Wt 167.0 lb

## 2018-10-24 DIAGNOSIS — I4819 Other persistent atrial fibrillation: Secondary | ICD-10-CM | POA: Diagnosis not present

## 2018-10-24 DIAGNOSIS — R079 Chest pain, unspecified: Secondary | ICD-10-CM

## 2018-10-24 DIAGNOSIS — I2510 Atherosclerotic heart disease of native coronary artery without angina pectoris: Secondary | ICD-10-CM | POA: Diagnosis not present

## 2018-10-24 DIAGNOSIS — I5032 Chronic diastolic (congestive) heart failure: Secondary | ICD-10-CM | POA: Diagnosis not present

## 2018-10-24 DIAGNOSIS — I272 Pulmonary hypertension, unspecified: Secondary | ICD-10-CM

## 2018-10-24 DIAGNOSIS — E1149 Type 2 diabetes mellitus with other diabetic neurological complication: Secondary | ICD-10-CM

## 2018-10-24 DIAGNOSIS — I10 Essential (primary) hypertension: Secondary | ICD-10-CM

## 2018-10-24 DIAGNOSIS — I700 Atherosclerosis of aorta: Secondary | ICD-10-CM

## 2018-10-24 DIAGNOSIS — R600 Localized edema: Secondary | ICD-10-CM

## 2018-10-24 DIAGNOSIS — E782 Mixed hyperlipidemia: Secondary | ICD-10-CM | POA: Diagnosis not present

## 2018-10-24 MED ORDER — FUROSEMIDE 20 MG PO TABS: 20.0000 mg | ORAL_TABLET | Freq: Every day | ORAL | 3 refills | Status: DC | PRN

## 2018-10-24 MED ORDER — POTASSIUM CHLORIDE ER 10 MEQ PO TBCR: 10.0000 meq | EXTENDED_RELEASE_TABLET | Freq: Every day | ORAL | 3 refills | Status: DC | PRN

## 2018-10-24 NOTE — Progress Notes (Signed)
I Cardiology Office Note  Date:  10/24/2018   ID:  Jodi Reeves, DOB February 21, 1928, MRN [id number]  PCP:  [name], Nino Glow, MD   Chief Complaint  Patient presents with  . 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  CT chest 04/20/2016, Calcifications of the aortic arch, mild calcification of the coronary arteries. Moderate calcifications of the abdominal aorta, which is tortuous. Previous stretching of esophagus with Dr. Candace Cruise Chronic leg swelling, venous She presents for follow-up of her atrial fibrillation and chest pain  Long discussion with her, numerous issues discussed She has been having worsening lower extremity edema Weight up to 170 pounds, baseline weight 164 pounds or less She has not been eating much, has been drinking more fluids, water nonstop Unclear if she has high salt intake, family does the shopping for her  Walking with support Weakness, Dairrhea, followed by [name]  Sugars higher Lots of fruits  Echo today, RVSP 47 mm Hg, EF >60%  HBA1C 7.5  BP at home 474 to 259D systolic Anxious today, 638 systolic in office No orthostasis  Son does groceries Denies any recent falls  Chronic lower extremity edema,sometimes uses compression hose, stable   Denies having significant chest pain Prior outpatient stress test low risk no ischemia  followed by endocrine  Labs reviewed HBA1C 7.5 High fruit intake  Declined cholesterol medication in the past Still on Xarelto  Other past medical history reviewed   emergency room March 18, 2017 for chest pain Hospital records reviewed with the patient in detail Cardiac enzymes negative  Outpatient stress test low risk no  ischemia  Previous  falls , Went to [facility], , Sacral fx Left face bruising, severe, Xarelto held Did not qualify for placement/SNF   Initially required 24 hour care at home walking with a walker,    PT 3x per week, OT,    RN 2 -3 x per week  CT scan chest March 18, 2017 with cardiomegaly and coronary artery calcification, aortic atherosclerosis  Chronic GI issues Previously had diarrhea, now with constipation. Working with GI  CT chest 04/20/2016: Calcifications of the aortic arch. mild calcification of the coronary arteries. Moderate calcifications of the abdominal aorta, which is tortuous. The origins of the celiac, SMA, single bilateral renal arteries are patent. The origin of the IMA is not seen, and may be occluded chronically.   TIA in January 2017, went to [facility] for evaluation She was talking with a friend, had word finding difficulty, friend was finishing her sentences Workup at [facility] reviewed She had CT scan of her head which was essentially normal,  carotid ultrasound that showed mild bilateral disease, no significant stenoses,  echocardiogram with ejection fraction greater than 65%, mild valve abnormality  Previous total cholesterol 201, LDL 107, hemoglobin A1c 6.9 Declined a cholesterol pill in the past   Previous mechanical fall at church while she was hanging up her coat in 2015, She fell backwards, hit her head, was transferred to [facility] While in the emergency room she had chest pain, had head CT scan, MRI, cardiac enzymes which were normal 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  PMH:   has a past medical history of Allergy, Anxiety, Balance disorder, Chronic diastolic CHF (congestive heart failure) ([place]), Colon polyps, Coronary artery disease, non-occlusive, DM2 (diabetes mellitus, type 2) ([place]), Fall, Falls frequently, GERD (gastroesophageal reflux disease),  Granuloma annulare, History of chicken pox, History of eating disorder, HLD (hyperlipidemia), HTN (hypertension), Hypothyroidism, IBS (irritable bowel syndrome), Incontinence of bowel, Osteoporosis, Ovarian cancer ([place]), Persistent atrial fibrillation, SBO (small bowel obstruction) ([place]), TIA (transient ischemic attack), UTI (urinary tract infection), and Venous insufficiency.  PSH:    Past Surgical History:  Procedure Laterality Date  . 2nd look laparotomy  1982  . ABDOMINAL HYSTERECTOMY     for ovarian cancer s/p hysterectomy total 1976 and exp lap 1980  . APPENDECTOMY    . CARDIAC CATHETERIZATION  8/05   neg; a fib found   . CARPAL TUNNEL RELEASE Left 03/06/2018   Procedure: CARPAL TUNNEL RELEASE ENDOSCOPIC;  Surgeon: Corky Mull, MD;  Location: [facility];  Service: Orthopedics;  Laterality: Left;  diabetic - oral meds  . cataract OD  2002  . cataract surgery  5/07   R  . CHOLECYSTECTOMY  1986  . DEXA  9/04 and 1/02  . EGD/dilation/colon  1/06  . ESOPHAGOGASTRODUODENOSCOPY (EGD) WITH PROPOFOL N/A 05/16/2016   Procedure: ESOPHAGOGASTRODUODENOSCOPY (EGD) WITH PROPOFOL with dilation;  Surgeon: Lucilla Lame, MD;  Location: [facility];  Service: Endoscopy;  Laterality: N/A;  . FEMORAL HERNIA REPAIR  1958   R  . fracture L elbow/wrist  2000  . intussception/obstruction  12/98  . JOINT REPLACEMENT     left shoulder  . kidney stone x2  1991  . laminectomy L4-5  1971  . LUMBAR LAMINECTOMY     1970 ruptured disc   . MOUTH SURGERY    . myoview stress  8/05   syncope (-)  . REVERSE SHOULDER ARTHROPLASTY Left 10/06/2014   Procedure: REVERSE SHOULDER ARTHROPLASTY;  Surgeon: Corky Mull, MD;  Location: [facility];  Service: Orthopedics;  Laterality: Left;  . stillbirth  1969  . TOTAL ABDOMINAL HYSTERECTOMY W/ BILATERAL SALPINGOOPHORECTOMY  1980   ovarian cancer  . WRIST FRACTURE SURGERY  11/05   R    Current Outpatient Medications  Medication Sig Dispense Refill  .  acetaminophen (TYLENOL) 500 MG tablet Take 1,000 mg by mouth every 6 (six) hours as needed for moderate pain or headache.    . Ascorbic Acid (VITAMIN C) POWD Take 200 mg by mouth daily.     . bisoprolol (ZEBETA) 5 MG tablet Take 1 tablet (5 mg total) by mouth daily. 90 tablet 3  . Calcium Citrate 200 MG TABS Take by mouth daily.    Marland Kitchen co-enzyme Q-10 30 MG capsule Take 30 mg by mouth daily.     Marland Kitchen estradiol (ESTRACE VAGINAL) 0.1 MG/GM vaginal cream Apply 0.5mg  (pea-sized amount)  just inside the vaginal introitus with a finger-tip on Monday, Wednesday and Friday nights. 30 g 12  . glucose blood (ONE TOUCH ULTRA TEST) test strip Use to test blood sugar once daily E11.49 (Patient taking differently: Use to test blood sugar twice daily E11.49) 100 each 1  . levothyroxine (SYNTHROID, LEVOTHROID) 50 MCG tablet Take 50 mcg by mouth daily before breakfast.    . losartan (COZAAR) 25 MG tablet Take 0.5 tablets (12.5 mg total) by mouth daily.    . metFORMIN (GLUCOPHAGE-XR) 500 MG 24 hr tablet Take 500 mg by mouth as directed. 1 tablet in AM, 2 tablets in PM    .  mirabegron ER (MYRBETRIQ) 25 MG TB24 tablet Take 1 tablet (25 mg total) by mouth daily. (Patient taking differently: Take 25 mg by mouth daily. Patient not taking on a daily basis.) 30 tablet 6  . Multiple Vitamin (CALCIUM COMPLEX PO) Take 1 tablet by mouth daily.    . NON FORMULARY Trivita Supplement B12 Takes qd. B6 qd, and folic acid    . Omega-3 1000 MG CAPS Take 2,000 mg by mouth daily.     . rivaroxaban (XARELTO) 20 MG TABS tablet Take 1 tablet (20 mg total) by mouth daily with supper. 30 tablet 6  . Folic Acid-Vit T0-VXB L39 (FOLPLEX 2.2) 2.2-25-0.5 MG TABS Place under the tongue.    . furosemide (LASIX) 20 MG tablet Take 1 tablet (20 mg total) by mouth daily as needed. 90 tablet 3  . potassium chloride (K-DUR) 10 MEQ tablet Take 1 tablet (10 mEq total) by mouth daily as needed. 90 tablet 3   No current facility-administered medications for this  visit.      Allergies:   Penicillins, Actos [pioglitazone], Amaryl [glimepiride], Amiodarone, Atorvastatin, Ciprofloxacin, Codeine, Ezetimibe-simvastatin, Fosamax [alendronate], Lovastatin, Macrobid [nitrofurantoin macrocrystal], Protonix [pantoprazole sodium], Rosiglitazone maleate, Statins, Sulfa antibiotics, Victoza [liraglutide], Alendronate sodium, and Bactrim [sulfamethoxazole-trimethoprim]   Social History:  The patient  reports that she has never smoked. She has never used smokeless tobacco. She reports that she does not drink alcohol or use drugs.   Family History:   family history includes Arthritis in her brother, brother, and sister; COPD in her brother; Cancer in her brother, brother, and brother; Depression in her brother and sister; Diabetes in her brother, mother, and paternal grandfather; Early death in her brother, brother, and father; Hearing loss in her brother, brother, brother, daughter, sister, and sister; Heart disease in her brother, mother, sister, sister, and sister; Hyperlipidemia in her brother; Hypertension in her daughter and sister; Stroke in her sister.    Review of Systems: Review of Systems  Constitutional:       Weight gain  Respiratory: Negative.   Cardiovascular: Positive for leg swelling.  Gastrointestinal: Negative.   Musculoskeletal: Negative.        Unsteady gait  Neurological: Positive for weakness.  Psychiatric/Behavioral: Negative.   All other systems reviewed and are negative.    PHYSICAL EXAM: VS:  BP (!) 153/79 (BP Location: Right Arm, Patient Position: Sitting, Cuff Size: Normal)   Pulse 88   Temp (!) 97 F (36.1 C)   Ht 5\' 3"  (1.6 m)   Wt 167 lb (75.8 kg) Comment: weighed this morning at home  SpO2 98%   BMI 29.58 kg/m  , BMI Body mass index is 29.58 kg/m. GEN: Well nourished, well developed, in no acute distress, obese , presenting with a cane HEENT: normal , large region of ecchymosis left face Neck: no JVD, carotid bruits, or  masses Cardiac: Irregularly irregular, no murmurs, rubs, or gallops, pitting lower extremity edema bilaterally  Respiratory:  clear to auscultation bilaterally, normal work of breathing GI: soft, nontender, nondistended, + BS MS: no deformity or atrophy home Skin: warm and dry, no rash Neuro:  Strength and sensation are intact Psych: euthymic mood, full affect    Recent Labs: 05/03/2018: ALT 7; BUN 18; Creatinine, Ser 0.67; Hemoglobin 13.0; Platelets 176.0; Potassium 4.5; Sodium 139; TSH 1.43    Lipid Panel Lab Results  Component Value Date   CHOL 183 05/03/2018   HDL 66.90 05/03/2018   LDLCALC 99 05/03/2018   TRIG 87.0 05/03/2018  Wt Readings from Last 3 Encounters:  10/24/18 167 lb (75.8 kg)  06/25/18 168 lb 11.2 oz (76.5 kg)  05/23/18 164 lb 6.4 oz (74.6 kg)      ASSESSMENT AND PLAN:  Pulmonary hypertension Echocardiogram done today with right heart pressures approximately 47 mmHg High fluid intake Previously did not want Lasix secondary to bladder irritation Given worsening swelling, weight gain, recommended she take Lasix 20 mg daily with potassium 10 mEq until weight improves After that could take Lasix as needed Suggested she cut down on her fluid intake Goal weight low 160 pounds.  Current weight 170 at home  Mixed hyperlipidemia  CT scan including mild coronary calcifications, aortic arch calcifications, moderate aortic atherosclerosis in the abdominal aorta  does not want a statin stable   Essential hypertension, benign - Plan: EKG 12-Lead Well-controlled at home Higher on today's visit but long history of numbers that she brings with her shows systolic pressure 564 up to 120  Persistent atrial fibrillation (HCC) Heart rate well controlled,  Tolerating Xarelto, no recent falls  Chronic diastolic CHF (congestive heart failure) ([place]) She does have weight gain, leg edema consistent with acute on chronic diastolic CHF She is willing to try Lasix,  previously reluctant secondary to bladder spasms Recommend she limit fluid intake, Lasix daily with potassium as above Symptoms likely exacerbated by underlying atrial fibrillation   Total encounter time more than 45 minutes  Greater than 50% was spent in counseling and coordination of care with the patient  Disposition:   F/U  3 months   No orders of the defined types were placed in this encounter.    Signed, Esmond Plants, M.D., Ph.D. 10/24/2018  [place], [place]

## 2018-10-24 NOTE — Patient Instructions (Addendum)
Moderate fluid intake    Medication Instructions:  Please start lasix 20 mg daily as needed with potassium 10 meq daily as needed (try 3 days a week to start)    If you need a refill on your cardiac medications before your next appointment, please call your pharmacy.    Lab work: No new labs needed   If you have labs (blood work) drawn today and your tests are completely normal, you will receive your results only by: Marland Kitchen MyChart Message (if you have MyChart) OR . A paper copy in the mail If you have any lab test that is abnormal or we need to change your treatment, we will call you to review the results.   Testing/Procedures: No new testing needed   Follow-Up: At [facility], you and your health needs are our priority.  As part of our continuing mission to provide you with exceptional heart care, we have created designated Provider Care Teams.  These Care Teams include your primary Cardiologist (physician) and Advanced Practice Providers (APPs -  Physician Assistants and Nurse Practitioners) who all work together to provide you with the care you need, when you need it.  . You will need a follow up appointment in 3 months .   Please call our office 2 months in advance to schedule this appointment.    . Providers on your designated Care Team:   . Murray Hodgkins, NP . Christell Faith, PA-C . Marrianne Mood, PA-C  Any Other Special Instructions Will Be Listed Below (If Applicable).  For educational health videos Log in to : [email or web address] Or : [email or web address], password : [email or web address]

## 2018-11-01 ENCOUNTER — Other Ambulatory Visit: Payer: Medicare HMO

## 2018-11-13 ENCOUNTER — Encounter: Payer: Self-pay | Admitting: Internal Medicine

## 2018-11-14 ENCOUNTER — Telehealth: Payer: Self-pay | Admitting: Cardiovascular Disease

## 2018-11-14 NOTE — Telephone Encounter (Signed)
Patient calling in regarding Lasix. Generally taking it everyday other than weekends, feels like she is doing alright with that. Patient does state that she is weak and does not have much energy or strength, not sure if that is from potassium or lasix. She did not take lasix today because she is feeling weak. BP readings are generally in 90-110/50-60, yesterday it was 131/71 but patient was upset. Patient would like to be advised.

## 2018-11-15 NOTE — Telephone Encounter (Signed)
Attempted to call the patient. No answer- I left a message for the patient to call back.  

## 2018-11-15 NOTE — Telephone Encounter (Signed)
I spoke with the patient. She states that she has been taking lasix 20 mg once daily except for holding on saturdays/ sundays.  She does feel like her energy is low, but is thinking this may be related to losartan 12.5 mg once daily. Her weight is stable at 165 lbs.  She is watching her salt/ fluid intake carefully.  She states Dr. Rockey Situ told her she could adjust her lasix as needed. She would like to try taking lasix 20 mg daily on M/W/F.   I advised with her weight being stable and her fatigue, that this is reasonable. I have advised she call back next week on friday to update [place] on how she is feeling.   The patient is agreeable and voices understanding.   She is requesting samples of Xarelto if available.  Xarelto 20 mg samples pulled and placed at the front desk. Lot: [id number] Exp: 4/22 # 3 bottles given

## 2018-11-18 ENCOUNTER — Other Ambulatory Visit (INDEPENDENT_AMBULATORY_CARE_PROVIDER_SITE_OTHER): Payer: Medicare HMO

## 2018-11-18 ENCOUNTER — Other Ambulatory Visit: Payer: Self-pay

## 2018-11-18 DIAGNOSIS — R69 Illness, unspecified: Secondary | ICD-10-CM | POA: Diagnosis not present

## 2018-11-18 DIAGNOSIS — E1149 Type 2 diabetes mellitus with other diabetic neurological complication: Secondary | ICD-10-CM | POA: Diagnosis not present

## 2018-11-18 DIAGNOSIS — E1142 Type 2 diabetes mellitus with diabetic polyneuropathy: Secondary | ICD-10-CM | POA: Diagnosis not present

## 2018-11-18 DIAGNOSIS — I10 Essential (primary) hypertension: Secondary | ICD-10-CM

## 2018-11-18 DIAGNOSIS — E039 Hypothyroidism, unspecified: Secondary | ICD-10-CM | POA: Diagnosis not present

## 2018-11-18 LAB — COMPREHENSIVE METABOLIC PANEL
ALT: 9 U/L (ref 0–35)
AST: 23 U/L (ref 0–37)
Albumin: 4.2 g/dL (ref 3.5–5.2)
Alkaline Phosphatase: 56 U/L (ref 39–117)
BUN: 19 mg/dL (ref 6–23)
CO2: 27 mEq/L (ref 19–32)
Calcium: 9.5 mg/dL (ref 8.4–10.5)
Chloride: 102 mEq/L (ref 96–112)
Creatinine, Ser: 0.79 mg/dL (ref 0.40–1.20)
GFR: 68.48 mL/min (ref >60.00)
Glucose, Bld: 156 mg/dL — ABNORMAL HIGH (ref 70–99)
Potassium: 4.1 mEq/L (ref 3.5–5.1)
Sodium: 139 mEq/L (ref 135–145)
Total Bilirubin: 0.8 mg/dL (ref 0.2–1.2)
Total Protein: 7.7 g/dL (ref 6.0–8.3)

## 2018-11-18 LAB — CBC WITH DIFFERENTIAL/PLATELET
Basophils Absolute: 0 10*3/uL (ref 0.0–0.1)
Basophils Relative: 0.5 % (ref 0.0–3.0)
Eosinophils Absolute: 0.1 10*3/uL (ref 0.0–0.7)
Eosinophils Relative: 1.2 % (ref 0.0–5.0)
HCT: 39.9 % (ref 36.0–46.0)
Hemoglobin: 13.3 g/dL (ref 12.0–15.0)
Lymphocytes Relative: 26.7 % (ref 12.0–46.0)
Lymphs Abs: 1.7 10*3/uL (ref 0.7–4.0)
MCHC: 33.5 g/dL (ref 30.0–36.0)
MCV: 100.8 fl — ABNORMAL HIGH (ref 78.0–100.0)
Monocytes Absolute: 0.4 10*3/uL (ref 0.1–1.0)
Monocytes Relative: 5.8 % (ref 3.0–12.0)
Neutro Abs: 4.1 10*3/uL (ref 1.4–7.7)
Neutrophils Relative %: 65.8 % (ref 43.0–77.0)
Platelets: 167 10*3/uL (ref 150.0–400.0)
RBC: 3.96 Mil/uL (ref 3.87–5.11)
RDW: 13.9 % (ref 11.5–15.5)
WBC: 6.3 10*3/uL (ref 4.0–10.5)

## 2018-11-18 LAB — HEMOGLOBIN A1C: Hgb A1c MFr Bld: 7.4 % — ABNORMAL HIGH (ref 4.6–6.5)

## 2018-11-18 LAB — TSH: TSH: 1.61 u[IU]/mL (ref 0.35–4.50)

## 2018-11-22 ENCOUNTER — Telehealth: Payer: Self-pay | Admitting: Cardiovascular Disease

## 2018-11-22 NOTE — Telephone Encounter (Signed)
Pt c/o BP issue: STAT if pt c/o blurred vision, one-sided weakness or slurred speech  1. What are your last 5 BP readings?  7-31 - 117/68 / HR 78 / W 165 7-30 - 106/61 / HR 70 / W 165 7-29- 117/72 / HR 78 / W 164 7-28 - 103/64 / HR 76 / W 165  7-27 - 104/60 / HR 77 / W165 7-26 - 117/70 / HR 76 / W165   2. Are you having any other symptoms (ex. Dizziness, headache, blurred vision, passed out)? Has been weak, called it a little "empty brained", has been "staggering".  Has been a bit stronger past couple days.  3. What is your BP issue? Patient states that the week before she was taking Lasix everyday but this week she has not been taking Lasix everyday.  Please call to discuss.

## 2018-11-22 NOTE — Telephone Encounter (Signed)
Left voicemail message for patient to call back.

## 2018-11-25 NOTE — Telephone Encounter (Signed)
Patient returning voicemail from prior week and would like a call back at [phone]

## 2018-11-25 NOTE — Telephone Encounter (Signed)
Pt sending over BP readings as FYI. She is currently taking Lasix MWF.   8/1 114/67, HR 77, wt 165 8/2 115/66, HR 75, wt 165 8/3 114/67, HR 77, wt 165  Pt reports she has ongoing weakness, she believes it is r/t losartan.   Last OV 7/2.   Routed to Dr. Rockey Situ to advise.

## 2018-11-29 NOTE — Telephone Encounter (Signed)
Returned call to patient with recommendations from Dr. Rockey Situ.   Pt verbalized understanding of POC and agreed to follow his recommendation.   Advised pt to call for any further questions or concerns.

## 2018-11-29 NOTE — Telephone Encounter (Signed)
Would be careful cutting back on lasix  One month ago with weight gain and leg swelling She can hold losartan for SBP <110 each day if she feels this is causing side effects

## 2018-12-09 ENCOUNTER — Telehealth: Payer: Self-pay | Admitting: Cardiovascular Disease

## 2018-12-09 NOTE — Telephone Encounter (Signed)
Patient dropped off BP readings to be reviewed °Placed in nurse box  °

## 2018-12-09 NOTE — Telephone Encounter (Signed)
BP readings received and given to [place], RN for him to review.

## 2018-12-11 NOTE — Telephone Encounter (Signed)
Spoke with patient and reviewed her blood pressure readings that she dropped off. Discussed that Dr. Rockey Situ and I reviewed her readings and everything looked good. She was glad to hear this and had no further questions at this time. She just wanted to make sure that she was following instructions correctly. Reviewed in detail and instructed her to continue monitoring and call if she should have any further questions or concerns.   12/09/18 107/62 HR 74 Weight 165  Held Losartan and took Lasix  12/08/18 113/60 HR 74 Weight 164 Took Losartan and Lasix  12/07/18 108/61 HR 76 Weight 164 Held Losartan & Lasix  12/06/18 114/65 HR 75 Weight 164 Took Losartan & Lasix  12/05/18 112/61 HR 76 Weight 165 Took Losartan & Lasix  12/04/18 101/60 HR 75 Weight 165 Held Losartan & took Lasix  12/03/18 102/57 HR 77 Weight 164 Held both Losartan & Lasix  12/02/18 104/61 HR 75 Weight 165 Held Losartan & took Lasix

## 2018-12-26 ENCOUNTER — Encounter: Payer: Self-pay | Admitting: Internal Medicine

## 2018-12-26 ENCOUNTER — Other Ambulatory Visit: Payer: Self-pay

## 2018-12-26 ENCOUNTER — Ambulatory Visit (INDEPENDENT_AMBULATORY_CARE_PROVIDER_SITE_OTHER): Payer: Medicare HMO | Admitting: Internal Medicine

## 2018-12-26 VITALS — BP 106/58 | HR 78 | Ht 63.0 in | Wt 165.0 lb

## 2018-12-26 DIAGNOSIS — I4891 Unspecified atrial fibrillation: Secondary | ICD-10-CM | POA: Diagnosis not present

## 2018-12-26 DIAGNOSIS — K5909 Other constipation: Secondary | ICD-10-CM

## 2018-12-26 DIAGNOSIS — R2681 Unsteadiness on feet: Secondary | ICD-10-CM

## 2018-12-26 DIAGNOSIS — R197 Diarrhea, unspecified: Secondary | ICD-10-CM

## 2018-12-26 DIAGNOSIS — R600 Localized edema: Secondary | ICD-10-CM

## 2018-12-26 DIAGNOSIS — E039 Hypothyroidism, unspecified: Secondary | ICD-10-CM | POA: Diagnosis not present

## 2018-12-26 DIAGNOSIS — E1149 Type 2 diabetes mellitus with other diabetic neurological complication: Secondary | ICD-10-CM

## 2018-12-26 DIAGNOSIS — N3281 Overactive bladder: Secondary | ICD-10-CM | POA: Diagnosis not present

## 2018-12-26 DIAGNOSIS — I10 Essential (primary) hypertension: Secondary | ICD-10-CM | POA: Diagnosis not present

## 2018-12-26 DIAGNOSIS — E1142 Type 2 diabetes mellitus with diabetic polyneuropathy: Secondary | ICD-10-CM

## 2018-12-26 DIAGNOSIS — I5032 Chronic diastolic (congestive) heart failure: Secondary | ICD-10-CM

## 2018-12-26 MED ORDER — METFORMIN HCL 500 MG PO TABS: ORAL_TABLET | ORAL | 3 refills | Status: DC

## 2018-12-26 NOTE — Progress Notes (Signed)
Telephone Note  I connected with Jodi Reeves   on 12/27/18 at  1:30 PM EDT by telephone and verified that I am speaking with the correct person using two identifiers.  Location patient: home Location provider:work or home office Persons participating in the virtual visit: patient, provider  I discussed the limitations of evaluation and management by telemedicine and the availability of in person appointments. The patient expressed understanding and agreed to proceed.   HPI: 1. DM 2 A1C 7.4 11/18/18 she was on metformin xr 500 in am and 1000 mg qhs reviewed on recall and will change to IR 2. Hypothyroidism tsh 11/18/18 normal on levo 50 mcg qd   -for 1 &2 she also f/u with [facility] endocrine Dr. Lyndel Safe   3. C/o fatigue and off balance for now she does not want to do PT due to COVID  4. C/o overactive bladder but she does not want to take myrbetriq 25 mg qd for now due to taking a lot of meds already  5. HTN BP log  8/26 112/61 HR 76 wt 164  8/27 109/64 HR 81 and wt 164  8/28 116/65 HR 74 wt 163  8/29 100/59 HR 78 wt 161  8/30 106/66 HR 76 wt 163  8/31 116/64 HR 80 wt 164  9/1 109/60 HR 80 wt 163  9/2 111/65 HR 77 wt 163  9/3/106/58 HR 78 wt 165  She is on losartan 12.5 mg qod, zebeta 5 mg qd, losartan 12.5 mg qod and holds if SBP <110, and lasix 20 mg daily with K.  She reports losartan makes her feel tired during the day and we discussed her moving the dose to night which she will do   6. She c/o diarrhea alternating with constipation and takes metamucil  She also c/o reduced appetite due to trouble swallowing and needs to f/u with [name] but wants to hold for now due to covid  Reviewed EGD 03/16/17 s/p dilatation   7. She does report memory issues onset and reduced sleep waking up Q2 hrs to urinate and hallucinations at times reviewed prior MRI 06/26/13 brain with age related changes    8. She is s/p CTS Dr. Roland Rack left and c/o numbness still in left hand   9. Chronic  diastolic CHF wt stable and taking lasix 20 mg daily now, leg edema slightly improved per pt   ROS: See pertinent positives and negatives per HPI.  Past Medical History:  Diagnosis Date  . Allergy   . Anxiety   . Balance disorder   . Chronic diastolic CHF (congestive heart failure) ([place])   . Colon polyps   . Coronary artery disease, non-occlusive    a. LHC 11/2003: 10% LAD stenosis, 20% LCx stenosis, and 40% RCA stenosis; b. nuc stress test 12/15: small region of mild ischemia in the apical region with WMA also noted in the apical region, EF 60%. She declined invasive evaluation at that time  . DM2 (diabetes mellitus, type 2) ([place])   . Fall    04/2017 see careeverywhere [facility] multiple fractures, brusing   . Falls frequently    h/o left shoudler/wrist fracture and left fingers numb  . GERD (gastroesophageal reflux disease)    esophageal web  . 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 History of chicken pox   . History of eating disorder   . HLD (hyperlipidemia)   . HTN (hypertension)   . Hypothyroidism   . IBS (irritable bowel syndrome)  diarrhea   . Incontinence of bowel   . Osteoporosis   . Ovarian cancer ([place])   . Persistent atrial fibrillation    a. CHADS2VASc = > 8 (CHF, HTN, age x 2, DM, TIA x 2, female); b. on Xarelto  . SBO (small bowel obstruction) ([place])    1998  . TIA (transient ischemic attack)   . UTI (urinary tract infection)   . Venous insufficiency     Past Surgical History:  Procedure Laterality Date  . 2nd look laparotomy  1982  . ABDOMINAL HYSTERECTOMY     for ovarian cancer s/p hysterectomy total 1976 and exp lap 1980  . APPENDECTOMY    . CARDIAC CATHETERIZATION  8/05   neg; a fib found   . CARPAL TUNNEL RELEASE Left 03/06/2018   Procedure: CARPAL TUNNEL RELEASE ENDOSCOPIC;  Surgeon: Corky Mull, MD;  Location: [place];  Service: Orthopedics;  Laterality: Left;  diabetic - oral meds  . CARPAL TUNNEL RELEASE     left CTS  Dr.Poggi  . cataract OD  2002  . cataract surgery  5/07   R  . CHOLECYSTECTOMY  1986  . DEXA  9/04 and 1/02  . EGD/dilation/colon  1/06  . ESOPHAGOGASTRODUODENOSCOPY (EGD) WITH PROPOFOL N/A 05/16/2016   Procedure: ESOPHAGOGASTRODUODENOSCOPY (EGD) WITH PROPOFOL with dilation;  Surgeon: Lucilla Lame, MD;  Location: [facility] ENDOSCOPY;  Service: Endoscopy;  Laterality: N/A;  . FEMORAL HERNIA REPAIR  1958   R  . fracture L elbow/wrist  2000  . intussception/obstruction  12/98  . JOINT REPLACEMENT     left shoulder  . kidney stone x2  1991  . laminectomy L4-5  1971  . LUMBAR LAMINECTOMY     1970 ruptured disc   . MOUTH SURGERY    . myoview stress  8/05   syncope (-)  . REVERSE SHOULDER ARTHROPLASTY Left 10/06/2014   Procedure: REVERSE SHOULDER ARTHROPLASTY;  Surgeon: Corky Mull, MD;  Location: [facility] ORS;  Service: Orthopedics;  Laterality: Left;  . stillbirth  1969  . TOTAL ABDOMINAL HYSTERECTOMY W/ BILATERAL SALPINGOOPHORECTOMY  1980   ovarian cancer  . WRIST FRACTURE SURGERY  11/05   R    Family History  Problem Relation Age of Onset  . Early death Father   . Diabetes Mother   . Heart disease Mother   . Arthritis Brother   . Cancer Brother   . Depression Brother   . Hearing loss Brother   . Arthritis Brother   . Cancer Brother        colon  . Hearing loss Brother   . Cancer Brother   . Diabetes Brother   . Hearing loss Brother   . Heart disease Brother   . Hyperlipidemia Brother   . Heart disease Sister   . Hypertension Sister   . Stroke Sister   . Hearing loss Daughter   . Hypertension Daughter   . Diabetes Paternal Grandfather   . Hearing loss Sister   . Heart disease Sister   . Arthritis Sister   . Depression Sister   . Hearing loss Sister   . Heart disease Sister   . COPD Brother   . Early death Brother   . 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  acetaminophen (TYLENOL) 500 MG tablet, Take 1,000 mg by mouth every 6 (six) hours as needed for moderate pain or headache., Disp: , Rfl:  .  Ascorbic Acid (VITAMIN C) POWD, Take 200 mg by mouth daily. , Disp: , Rfl:  .  bisoprolol (ZEBETA) 5 MG tablet, Take 1 tablet (5 mg total) by mouth daily., Disp: 90 tablet, Rfl: 3 .  Calcium Citrate 200 MG TABS, Take by mouth daily., Disp: , Rfl:  .  co-enzyme Q-10 30 MG capsule, Take 30 mg by mouth daily. , Disp: , Rfl:  .  estradiol (ESTRACE VAGINAL) 0.1 MG/GM vaginal cream, Apply 0.16m (pea-sized amount)  just inside the vaginal introitus with a finger-tip on Monday, Wednesday and Friday nights., Disp: 30 g, Rfl: 12 .  Folic Acid-Vit [id number](FOLPLEX 2.2) 2.2-25-0.5 MG TABS, Place under the tongue., Disp: , Rfl:  .  furosemide (LASIX) 20 MG tablet, Take 1 tablet (20 mg total) by mouth daily as needed., Disp: 90 tablet, Rfl: 3 .  glucose blood (ONE TOUCH ULTRA TEST) test strip, Use to test blood sugar once daily E11.49 (Patient taking differently: Use to test blood sugar twice daily E11.49), Disp: 100 each, Rfl: 1 .  levothyroxine (SYNTHROID, LEVOTHROID) 50 MCG tablet, Take 50 mcg by mouth daily before breakfast., Disp: , Rfl:  .  Multiple Vitamin (CALCIUM COMPLEX PO), Take 1 tablet by mouth daily., Disp: , Rfl:  .  NON FORMULARY, [place] Supplement B12 Takes qd. B6 qd, and folic acid, Disp: , Rfl:  .  Omega-3 1000 MG CAPS, Take 2,000 mg by mouth daily. , Disp: , Rfl:  .  potassium chloride (K-DUR) 10 MEQ tablet, Take 1 tablet (10 mEq total) by mouth daily as needed., Disp: 90 tablet, Rfl: 3 .  psyllium (METAMUCIL) 58.6 % packet, Take 1 packet by mouth daily., Disp: , Rfl:  .  rivaroxaban (XARELTO) 20 MG TABS tablet, Take 1 tablet (20 mg total) by mouth daily with supper., Disp: 30 tablet,  Rfl: 6 .  losartan (COZAAR) 25 MG tablet, Take 0.5 tablets (12.5 mg total) by mouth daily., Disp: , Rfl:  .  metFORMIN (GLUCOPHAGE) 500 MG tablet, 1 pill in the am and 2 pills in pm with food, Disp: 270 tablet, Rfl: 3 .  mirabegron ER (MYRBETRIQ) 25 MG TB24 tablet, Take 1 tablet (25 mg total) by mouth daily. (Patient not taking: Reported on 12/26/2018), Disp: 30 tablet, Rfl: 6  EXAM:  VITALS per patient if applicable:  GENERAL: alert, oriented, appears well and in no acute distress  PSYCH/NEURO: pleasant and cooperative, no obvious depression or anxiety, speech and thought processing grossly intact, +talkative as usual.   ASSESSMENT AND PLAN:  Discussed the following assessment and plan:  Type 2 diabetes mellitus with neuropathy, controlled ([place] - Plan: metFORMIN (GLUCOPHAGE) 500 MG tablet -d/c xr metformin 500 in am and 1000 mg qhs and change to IR due to recall  -f/u [facility] endo Dr. OLadell Pierthough no f/u sch as of yet   Hypothyroidism, unspecified type -cont levo 50 mcg qd f/u endocrine  Essential hypertension She is on losartan 12.5 mg qod, zebeta 5 mg qd, losartan 12.5 mg qod and holds if SBP <110, and lasix 20 mg daily with K.  She reports losartan makes her feel tired during the day and we discussed her moving the dose to night which she will do   Atrial fibrillation, unspecified type ([place] -f/u cards on xarelto  Other constipation alternating with diarrhea and dysphagia s/p dilatation 02/2017  -pt wants to hold on seeing [name] for now -she is talking metamucil as he instructed   Overactive bladder she stopped myrbetriq 25 mg qd   Gait instability-disc PT pt wants to hold for now   Chronic diastolic CHF (congestive heart failure) with improved leg edema with daily lasix 20 mg qd and stable wt  Leg edema could be chronic lymphedema and not related to heart  -cont meds lasix 20 mg qd with K  -rec limit fluids to <50 oz daily total    HM Flu shot 10/23/19will need  upcoming  prevnar had 03/03/16  Check [facility] for pna 23had 2005 per pt stated [facility] for [facility] of proof if not will need pna 23 Tdaputd zostervax had  Consider shingrixfuture  Declines check MMR Reviewed B12 lab normal 957 06/28/17  Out of age window:  mammo Pap s/p hysterectomy  Colonoscopy  Declines DEXA  She is established with dermatology h/o [facility]. Istenstein now [facility]  05/01/2018 Dr. Roland Rack left CTS left f/u CTS release doing betterbut 12/26/18 c/w left hand numbness  cards Dr. Rockey Situ due to f/u 07/2019  GI-Dr.Wohl -pt wants to hold on referral  Endocrine [facility] Dr. Honor Junes -no f/u sch as of 12/2018   of note with h/o CAD, atherosclerosis consider US carotid in future   I discussed the assessment and treatment plan with the patient. The patient was provided an opportunity to ask questions and all were answered. The patient agreed with the plan and demonstrated an understanding of the instructions.   The patient was advised to call back or seek an in-person evaluation if the symptoms worsen or if the condition fails to improve as anticipated.  Time spent 20 minutes  Delorise [place], MD

## 2018-12-27 ENCOUNTER — Encounter: Payer: Self-pay | Admitting: Internal Medicine

## 2018-12-31 ENCOUNTER — Ambulatory Visit: Payer: Medicare HMO | Admitting: Internal Medicine

## 2018-12-31 LAB — HM DIABETES EYE EXAM

## 2019-01-06 DIAGNOSIS — E113293 Type 2 diabetes mellitus with mild nonproliferative diabetic retinopathy without macular edema, bilateral: Secondary | ICD-10-CM | POA: Diagnosis not present

## 2019-01-07 DIAGNOSIS — R69 Illness, unspecified: Secondary | ICD-10-CM | POA: Diagnosis not present

## 2019-01-20 DIAGNOSIS — E11319 Type 2 diabetes mellitus with unspecified diabetic retinopathy without macular edema: Secondary | ICD-10-CM | POA: Insufficient documentation

## 2019-01-23 DIAGNOSIS — R69 Illness, unspecified: Secondary | ICD-10-CM | POA: Diagnosis not present

## 2019-02-05 DIAGNOSIS — E1142 Type 2 diabetes mellitus with diabetic polyneuropathy: Secondary | ICD-10-CM | POA: Diagnosis not present

## 2019-02-05 DIAGNOSIS — I10 Essential (primary) hypertension: Secondary | ICD-10-CM | POA: Diagnosis not present

## 2019-02-05 DIAGNOSIS — E1169 Type 2 diabetes mellitus with other specified complication: Secondary | ICD-10-CM | POA: Diagnosis not present

## 2019-02-05 DIAGNOSIS — E1159 Type 2 diabetes mellitus with other circulatory complications: Secondary | ICD-10-CM | POA: Diagnosis not present

## 2019-02-05 DIAGNOSIS — E785 Hyperlipidemia, unspecified: Secondary | ICD-10-CM | POA: Diagnosis not present

## 2019-02-18 ENCOUNTER — Telehealth: Payer: Self-pay | Admitting: Cardiovascular Disease

## 2019-02-18 NOTE — Telephone Encounter (Signed)
Spoke with patient and informed her that we do not have any Xarelto 20mg  samples at this time.

## 2019-02-18 NOTE — Telephone Encounter (Signed)
Patient calling the office for samples of medication:   1.  What medication and dosage are you requesting samples for? Xarelto 20 MG   2.  Are you currently out of this medication? Not yet, will be soon

## 2019-02-18 NOTE — Telephone Encounter (Signed)
Virtual Visit Pre-Appointment Phone Call  "(Name), I am calling you today to discuss your upcoming appointment. We are currently trying to limit exposure to the virus that causes COVID-19 by seeing patients at home rather than in the office."  1. "What is the BEST phone number to call the day of the visit?" - include this in appointment notes  2. Do you have or have access to (through a family member/friend) a smartphone with video capability that we can use for your visit?" a. If yes - list this number in appt notes as cell (if different from BEST phone #) and list the appointment type as a VIDEO visit in appointment notes b. If no - list the appointment type as a PHONE visit in appointment notes  3. Confirm consent - "In the setting of the current Covid19 crisis, you are scheduled for a (phone or video) visit with your provider on (date) at (time).  Just as we do with many in-office visits, in order for you to participate in this visit, we must obtain consent.  If you'd like, I can send this to your mychart (if signed up) or email for you to review.  Otherwise, I can obtain your verbal consent now.  All virtual visits are billed to your insurance company just like a normal visit would be.  By agreeing to a virtual visit, we'd like you to understand that the technology does not allow for your provider to perform an examination, and thus may limit your provider's ability to fully assess your condition. If your provider identifies any concerns that need to be evaluated in person, we will make arrangements to do so.  Finally, though the technology is pretty good, we cannot assure that it will always work on either your or our end, and in the setting of a video visit, we may have to convert it to a phone-only visit.  In either situation, we cannot ensure that we have a secure connection.  Are you willing to proceed?" STAFF: Did the patient verbally acknowledge consent to telehealth visit? Document  YES/NO here: YES  4. Advise patient to be prepared - "Two hours prior to your appointment, go ahead and check your blood pressure, pulse, oxygen saturation, and your weight (if you have the equipment to check those) and write them all down. When your visit starts, your provider will ask you for this information. If you have an Apple Watch or Kardia device, please plan to have heart rate information ready on the day of your appointment. Please have a pen and paper handy nearby the day of the visit as well."  5. Give patient instructions for MyChart download to smartphone OR Doximity/Doxy.me as below if video visit (depending on what platform provider is using)  6. Inform patient they will receive a phone call 15 minutes prior to their appointment time (may be from unknown caller ID) so they should be prepared to answer    TELEPHONE CALL NOTE  Jodi Reeves has been deemed a candidate for a follow-up tele-health visit to limit community exposure during the Covid-19 pandemic. I spoke with the patient via phone to ensure availability of phone/video source, confirm preferred email & phone number, and discuss instructions and expectations.  I reminded Jodi Reeves to be prepared with any vital sign and/or heart rhythm information that could potentially be obtained via home monitoring, at the time of her visit. I reminded Jodi Reeves to expect a phone call prior to  her visit.  Caryl Pina Gerringer 02/18/2019 3:02 PM   INSTRUCTIONS FOR DOWNLOADING THE MYCHART APP TO SMARTPHONE  - The patient must first make sure to have activated MyChart and know their login information - If Apple, go to [employer] and type in MyChart in the search bar and download the app. If Android, ask patient to go to [employer] and type in [facility] in the search bar and download the app. The app is free but as with any other app downloads, their phone may require them to verify saved payment information or  Apple/Android password.  - The patient will need to then log into the app with their MyChart username and password, and select [facility] as their healthcare provider to link the account. When it is time for your visit, go to the MyChart app, find appointments, and click Begin Video Visit. Be sure to Select Allow for your device to access the Microphone and Camera for your visit. You will then be connected, and your provider will be with you shortly.  **If they have any issues connecting, or need assistance please contact MyChart service desk [phone])**  **If using a computer, in order to ensure the best quality for their visit they will need to use either of the following Internet Browsers: [employer], or Google Chrome**  IF USING DOXIMITY or DOXY.ME - The patient will receive a link just prior to their visit by text.     FULL LENGTH CONSENT FOR TELE-HEALTH VISIT   I hereby voluntarily request, consent and authorize [facility] and its employed or contracted physicians, physician assistants, nurse practitioners or other licensed health care professionals (the Practitioner), to provide me with telemedicine health care services (the Services") as deemed necessary by the treating Practitioner. I acknowledge and consent to receive the Services by the Practitioner via telemedicine. I understand that the telemedicine visit will involve communicating with the Practitioner through live audiovisual communication technology and the disclosure of certain medical information by electronic transmission. I acknowledge that I have been given the opportunity to request an in-person assessment or other available alternative prior to the telemedicine visit and am voluntarily participating in the telemedicine visit.  I understand that I have the right to withhold or withdraw my consent to the use of telemedicine in the course of my care at any time, without affecting my right to future care  or treatment, and that the Practitioner or I may terminate the telemedicine visit at any time. I understand that I have the right to inspect all information obtained and/or recorded in the course of the telemedicine visit and may receive copies of available information for a reasonable fee.  I understand that some of the potential risks of receiving the Services via telemedicine include:   Delay or interruption in medical evaluation due to technological equipment failure or disruption;  Information transmitted may not be sufficient (e.g. poor resolution of images) to allow for appropriate medical decision making by the Practitioner; and/or   In rare instances, security protocols could fail, causing a breach of personal health information.  Furthermore, I acknowledge that it is my responsibility to provide information about my medical history, conditions and care that is complete and accurate to the best of my ability. I acknowledge that Practitioner's advice, recommendations, and/or decision may be based on factors not within their control, such as incomplete or inaccurate data provided by me or distortions of diagnostic images or specimens that may result from electronic transmissions. I understand  that the practice of medicine is not an exact science and that Practitioner makes no warranties or guarantees regarding treatment outcomes. I acknowledge that I will receive a copy of this consent concurrently upon execution via email to the email address I last provided but may also request a printed copy by calling the office of [facility].    I understand that my insurance will be billed for this visit.   I have read or had this consent read to me.  I understand the contents of this consent, which adequately explains the benefits and risks of the Services being provided via telemedicine.   I have been provided ample opportunity to ask questions regarding this consent and the Services and have had  my questions answered to my satisfaction.  I give my informed consent for the services to be provided through the use of telemedicine in my medical care  By participating in this telemedicine visit I agree to the above.

## 2019-02-26 ENCOUNTER — Telehealth: Payer: Medicare HMO | Admitting: Cardiovascular Disease

## 2019-02-27 DIAGNOSIS — D2272 Melanocytic nevi of left lower limb, including hip: Secondary | ICD-10-CM | POA: Diagnosis not present

## 2019-02-27 DIAGNOSIS — L728 Other follicular cysts of the skin and subcutaneous tissue: Secondary | ICD-10-CM | POA: Diagnosis not present

## 2019-02-27 DIAGNOSIS — D2262 Melanocytic nevi of left upper limb, including shoulder: Secondary | ICD-10-CM | POA: Diagnosis not present

## 2019-02-27 DIAGNOSIS — L821 Other seborrheic keratosis: Secondary | ICD-10-CM | POA: Diagnosis not present

## 2019-02-27 DIAGNOSIS — D2261 Melanocytic nevi of right upper limb, including shoulder: Secondary | ICD-10-CM | POA: Diagnosis not present

## 2019-02-28 DIAGNOSIS — R69 Illness, unspecified: Secondary | ICD-10-CM | POA: Diagnosis not present

## 2019-03-06 ENCOUNTER — Ambulatory Visit: Payer: Medicare HMO | Admitting: Gastroenterology

## 2019-03-06 ENCOUNTER — Other Ambulatory Visit: Payer: Self-pay

## 2019-03-06 ENCOUNTER — Encounter: Payer: Self-pay | Admitting: Gastroenterology

## 2019-03-06 ENCOUNTER — Encounter (INDEPENDENT_AMBULATORY_CARE_PROVIDER_SITE_OTHER): Payer: Self-pay

## 2019-03-06 VITALS — BP 168/92 | HR 101 | Ht 63.0 in | Wt 170.0 lb

## 2019-03-06 DIAGNOSIS — K582 Mixed irritable bowel syndrome: Secondary | ICD-10-CM | POA: Diagnosis not present
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
## 2019-03-08 NOTE — Progress Notes (Signed)
I Cardiology Office Note  Date:  03/11/2019   ID:  Jodi Reeves, DOB [birth year], MRN [id number]  PCP:  [name], Nino Glow, MD   Chief Complaint  Patient presents with  . other    3 month follow up. Meds reviewed by the pt.'s med list. Pt. c/o chest pain at times.     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  CT chest 04/20/2016, Calcifications of the aortic arch, mild calcification of the coronary arteries. Moderate calcifications of the abdominal aorta, which is tortuous. Previous stretching of esophagus with Dr. Candace Cruise Chronic leg swelling, venous She presents for follow-up of her atrial fibrillation and chest pain  In follow-up today reports that things are relatively stable Continues to live at home alone, has help from family  Chronic leg swelling Bought a leg wrap, velcro, does not use it very much Has compression hose which she wears regularly Previous wound left lower extremity has healed No other ulcerations  Weight  170 pounds in office 166 at home Weight used to be in the low 160s Overall has been stable on diuretic use daily  Presents in wheelchair on today's visit Has a cane Denies any falls  No chest pain, chronic shortness of breath with exertion  EKG personally reviewed by myself on todays visit Atrial fibrillation rate 71   The past medical history reviewed Prior echo RVSP 47 mm Hg, EF >60% HBA1C 7.4 CR 0.79,   Declined cholesterol medication in the past Still on Xarelto   emergency room March 18, 2017 for chest pain Hospital records reviewed with the patient in detail Cardiac enzymes negative  Outpatient stress test low risk no ischemia  Previous  falls , [facility], , Sacral fx Left face  bruising, severe, Xarelto held Did not qualify for placement/SNF   Initially required 24 hour care at home walking with a walker,    PT 3x per week, OT,    RN 2 -3 x per week  CT scan chest March 18, 2017 with cardiomegaly and coronary artery calcification, aortic atherosclerosis  Chronic GI issues Previously had diarrhea, now with constipation. Working with GI  CT chest 04/20/2016: Calcifications of the aortic arch. mild calcification of the coronary arteries. Moderate calcifications of the abdominal aorta, which is tortuous. The origins of the celiac, SMA, single bilateral renal arteries are patent. The origin of the IMA is not seen, and may be occluded chronically.   TIA in January 2017, went to [facility] for evaluation She was talking with a friend, had word finding difficulty, friend was finishing her sentences Workup at [facility] reviewed She had CT scan of her head which was essentially normal,  carotid ultrasound that showed mild bilateral disease, no significant stenoses,  echocardiogram with ejection fraction greater than 65%, mild valve abnormality    PMH:   has a past medical history of Allergy, Anxiety, Balance disorder, Chronic diastolic CHF (congestive heart failure) ([place]), Colon polyps, Coronary artery disease, non-occlusive, DM2 (diabetes mellitus, type 2) ([place]), Fall, Falls frequently, GERD (gastroesophageal reflux disease), Granuloma annulare, History of chicken pox, History of eating disorder, HLD (hyperlipidemia), HTN (hypertension), Hypothyroidism, IBS (irritable bowel syndrome), Incontinence of bowel, Osteoporosis, Ovarian cancer ([place]), Persistent atrial fibrillation ([place]), SBO (small bowel obstruction) ([place]), TIA (transient ischemic attack), UTI (urinary tract infection),  and Venous insufficiency.  PSH:    Past Surgical History:  Procedure Laterality Date  . 2nd look laparotomy  1982  . ABDOMINAL HYSTERECTOMY     for ovarian cancer s/p hysterectomy total 1976 and exp  lap 1980  . APPENDECTOMY    . CARDIAC CATHETERIZATION  8/05   neg; a fib found   . CARPAL TUNNEL RELEASE Left 03/06/2018   Procedure: CARPAL TUNNEL RELEASE ENDOSCOPIC;  Surgeon: Corky Mull, MD;  Location: [place];  Service: Orthopedics;  Laterality: Left;  diabetic - oral meds  . CARPAL TUNNEL RELEASE     left CTS Dr.Poggi  . cataract OD  2002  . cataract surgery  5/07   R  . CHOLECYSTECTOMY  1986  . DEXA  9/04 and 1/02  . EGD/dilation/colon  1/06  . ESOPHAGOGASTRODUODENOSCOPY (EGD) WITH PROPOFOL N/A 05/16/2016   Procedure: ESOPHAGOGASTRODUODENOSCOPY (EGD) WITH PROPOFOL with dilation;  Surgeon: Lucilla Lame, MD;  Location: [facility];  Service: Endoscopy;  Laterality: N/A;  . FEMORAL HERNIA REPAIR  1958   R  . fracture L elbow/wrist  2000  . intussception/obstruction  12/98  . JOINT REPLACEMENT     left shoulder  . kidney stone x2  1991  . laminectomy L4-5  1971  . LUMBAR LAMINECTOMY     1970 ruptured disc   . MOUTH SURGERY    . myoview stress  8/05   syncope (-)  . REVERSE SHOULDER ARTHROPLASTY Left 10/06/2014   Procedure: REVERSE SHOULDER ARTHROPLASTY;  Surgeon: Corky Mull, MD;  Location: [facility] ORS;  Service: Orthopedics;  Laterality: Left;  . stillbirth  1969  . TOTAL ABDOMINAL HYSTERECTOMY W/ BILATERAL SALPINGOOPHORECTOMY  1980   ovarian cancer  . WRIST FRACTURE SURGERY  11/05   R    Current Outpatient Medications  Medication Sig Dispense Refill  . acetaminophen (TYLENOL) 500 MG tablet Take 1,000 mg by mouth every 6 (six) hours as needed for moderate pain or headache.    . Ascorbic Acid (VITAMIN C) POWD Take 200 mg by mouth daily.     . bisoprolol (ZEBETA) 5 MG tablet Take 1 tablet (5 mg total) by mouth daily. 90 tablet 3  . Calcium Citrate 200 MG TABS Take by mouth daily.    Marland Kitchen co-enzyme Q-10 30 MG capsule Take 30 mg by mouth daily.     Marland Kitchen estradiol (ESTRACE VAGINAL) 0.1 MG/GM vaginal cream Apply 0.5mg  (pea-sized amount)  just inside the vaginal  introitus with a finger-tip on Monday, Wednesday and Friday nights. 30 g 12  . Folic Acid-Vit Q000111Q 123456 (FOLPLEX 2.2) 2.2-25-0.5 MG TABS Place under the tongue.    . furosemide (LASIX) 20 MG tablet Take 1 tablet (20 mg total) by mouth daily as needed. 90 tablet 3  . glimepiride (AMARYL) 2 MG tablet Take 2 mg by mouth daily.    Marland Kitchen glucose blood (ONE TOUCH ULTRA TEST) test strip Use to test blood sugar once daily E11.49 (Patient taking differently: Use to test blood sugar twice daily E11.49) 100 each 1  . levothyroxine (SYNTHROID, LEVOTHROID) 50 MCG tablet Take 50 mcg by mouth daily before breakfast.    . losartan (COZAAR) 25 MG tablet Take 0.5 tablets (12.5 mg total) by mouth daily.    . metFORMIN (GLUCOPHAGE) 500 MG tablet 1 pill in the am and 2 pills in pm with food 270 tablet 3  . mirabegron ER (MYRBETRIQ) 25 MG TB24 tablet Take 1 tablet (25 mg total) by mouth daily. 30 tablet 6  .  Multiple Vitamin (CALCIUM COMPLEX PO) Take 1 tablet by mouth daily.    . NON FORMULARY [place] Supplement B12 Takes qd. B6 qd, and folic acid    . Omega-3 1000 MG CAPS Take 2,000 mg by mouth daily.     . potassium chloride (K-DUR) 10 MEQ tablet Take 1 tablet (10 mEq total) by mouth daily as needed. 90 tablet 3  . psyllium (METAMUCIL) 58.6 % packet Take 1 packet by mouth daily.    . rivaroxaban (XARELTO) 20 MG TABS tablet Take 1 tablet (20 mg total) by mouth daily with supper. 30 tablet 6   No current facility-administered medications for this visit.     Allergies:   Penicillins, Actos [pioglitazone], Amaryl [glimepiride], Amiodarone, Atorvastatin, Ciprofloxacin, Codeine, Ezetimibe-simvastatin, Fosamax [alendronate], Lovastatin, Macrobid [nitrofurantoin macrocrystal], Protonix [pantoprazole sodium], Rosiglitazone maleate, Statins, Sulfa antibiotics, Victoza [liraglutide], Alendronate sodium, and Bactrim [sulfamethoxazole-trimethoprim]   Social History:  The patient  reports that she has never smoked. She has never  used smokeless tobacco. She reports that she does not drink alcohol or use drugs.   Family History:   family history includes Arthritis in her brother, brother, and sister; COPD in her brother; Cancer in her brother, brother, and brother; Depression in her brother and sister; Diabetes in her brother, mother, and paternal grandfather; Early death in her brother, brother, and father; Hearing loss in her brother, brother, brother, daughter, sister, and sister; Heart disease in her brother, mother, sister, sister, and sister; Hyperlipidemia in her brother; Hypertension in her daughter and sister; Stroke in her sister.    Review of Systems: Review of Systems  Constitutional:       Weight gain  HENT: Negative.   Respiratory: Negative.   Cardiovascular: Positive for leg swelling.  Gastrointestinal: Negative.   Musculoskeletal: Negative.        Unsteady gait  Neurological: Negative.   Psychiatric/Behavioral: Negative.  Substance abuse: .bpok.  All other systems reviewed and are negative.   PHYSICAL EXAM: VS:  BP 140/70 (BP Location: Left Arm, Patient Position: Sitting, Cuff Size: Normal)   Pulse 71   Temp (!) 97.2 F (36.2 C)   Ht 5\' 3"  (1.6 m)   Wt 170 lb (77.1 kg)   BMI 30.11 kg/m  , BMI Body mass index is 30.11 kg/m. Constitutional:  oriented to person, place, and time. No distress.  HENT:  Head: Grossly normal Eyes:  no discharge. No scleral icterus.  Neck: No JVD, no carotid bruits  Cardiovascular: Regular rate and rhythm, no murmurs appreciated Nonpitting LE edema Pulmonary/Chest: Clear to auscultation bilaterally, no wheezes or rails Abdominal: Soft.  no distension.  no tenderness.  Musculoskeletal: Normal range of motion Neurological:  normal muscle tone. Coordination normal. No atrophy Skin: Skin warm and dry Psychiatric: normal affect, pleasant  Recent Labs: 11/18/2018: ALT 9; BUN 19; Creatinine, Ser 0.79; Hemoglobin 13.3; Platelets 167.0; Potassium 4.1; Sodium 139; TSH  1.61    Lipid Panel Lab Results  Component Value Date   CHOL 183 05/03/2018   HDL 66.90 05/03/2018   LDLCALC 99 05/03/2018   TRIG 87.0 05/03/2018      Wt Readings from Last 3 Encounters:  03/11/19 170 lb (77.1 kg)  03/06/19 170 lb (77.1 kg)  12/26/18 165 lb (74.8 kg)      ASSESSMENT AND PLAN:  Pulmonary hypertension Prior Echocardiogram right heart pressures approximately 47 mmHg Current weight 170 at home, relatively stable Recommend she stay on her Lasix daily Continued use of compression hose  Mixed hyperlipidemia  CT  scan including mild coronary calcifications, aortic arch calcifications, moderate aortic atherosclerosis in the abdominal aorta  does not want a statin  no further workup at this time  Essential hypertension, benign - Plan: EKG 12-Lead Blood pressure is well controlled on today's visit. No changes made to the medications.  Persistent atrial fibrillation (HCC) rate contriolled Tolerating Xarelto, no recent falls  Chronic diastolic CHF (congestive heart failure) (HCC) Weight stable, chronic leg swelling (venous insuff)  Diabetes: Followed by endocrine, numbers doing better   Total encounter time more than 25 minutes  Greater than 50% was spent in counseling and coordination of care with the patient  Disposition:   F/U  6 months   Orders Placed This Encounter  Procedures  . EKG 12-Lead    Signed, Esmond Plants, M.D., Ph.D. 03/11/2019  [place], [place]

## 2019-03-11 ENCOUNTER — Ambulatory Visit (INDEPENDENT_AMBULATORY_CARE_PROVIDER_SITE_OTHER): Payer: Medicare HMO | Admitting: Cardiovascular Disease

## 2019-03-11 ENCOUNTER — Other Ambulatory Visit: Payer: Self-pay

## 2019-03-11 ENCOUNTER — Encounter: Payer: Self-pay | Admitting: Cardiovascular Disease

## 2019-03-11 VITALS — BP 140/70 | HR 71 | Temp 97.2°F | Ht 63.0 in | Wt 170.0 lb

## 2019-03-11 DIAGNOSIS — I2510 Atherosclerotic heart disease of native coronary artery without angina pectoris: Secondary | ICD-10-CM

## 2019-03-11 DIAGNOSIS — E782 Mixed hyperlipidemia: Secondary | ICD-10-CM

## 2019-03-11 DIAGNOSIS — I4819 Other persistent atrial fibrillation: Secondary | ICD-10-CM

## 2019-03-11 DIAGNOSIS — I272 Pulmonary hypertension, unspecified: Secondary | ICD-10-CM | POA: Diagnosis not present

## 2019-03-11 DIAGNOSIS — R079 Chest pain, unspecified: Secondary | ICD-10-CM | POA: Diagnosis not present

## 2019-03-11 DIAGNOSIS — I700 Atherosclerosis of aorta: Secondary | ICD-10-CM

## 2019-03-11 DIAGNOSIS — I10 Essential (primary) hypertension: Secondary | ICD-10-CM | POA: Diagnosis not present

## 2019-03-11 DIAGNOSIS — I5032 Chronic diastolic (congestive) heart failure: Secondary | ICD-10-CM | POA: Diagnosis not present

## 2019-03-11 DIAGNOSIS — E1149 Type 2 diabetes mellitus with other diabetic neurological complication: Secondary | ICD-10-CM | POA: Diagnosis not present

## 2019-03-11 NOTE — Patient Instructions (Addendum)

## 2019-03-27 ENCOUNTER — Ambulatory Visit: Payer: Medicare HMO | Admitting: Podiatry

## 2019-04-03 ENCOUNTER — Ambulatory Visit: Payer: Medicare HMO | Admitting: Podiatry

## 2019-04-07 ENCOUNTER — Other Ambulatory Visit: Payer: Self-pay | Admitting: Urology

## 2019-04-15 ENCOUNTER — Ambulatory Visit: Payer: Medicare HMO

## 2019-04-21 ENCOUNTER — Ambulatory Visit: Payer: Medicare HMO | Admitting: Podiatry

## 2019-04-22 ENCOUNTER — Other Ambulatory Visit: Payer: Self-pay

## 2019-04-22 ENCOUNTER — Encounter (INDEPENDENT_AMBULATORY_CARE_PROVIDER_SITE_OTHER): Payer: Self-pay

## 2019-04-22 ENCOUNTER — Ambulatory Visit (INDEPENDENT_AMBULATORY_CARE_PROVIDER_SITE_OTHER): Payer: Medicare HMO

## 2019-04-22 VITALS — Ht 63.0 in | Wt 170.0 lb

## 2019-04-22 DIAGNOSIS — Z0000 Encounter for general adult medical examination without abnormal findings: Secondary | ICD-10-CM | POA: Diagnosis not present

## 2019-04-22 NOTE — Patient Instructions (Addendum)
  Ms. Cuffie , Thank you for taking time to come for your Medicare Wellness Visit. I appreciate your ongoing commitment to your health goals. Please review the following plan we discussed and let me know if I can assist you in the future.   These are the goals we discussed: Goals    . Follow up with Primary Care Provider     As needed       This is a list of the screening recommended for you and due dates:  Health Maintenance  Topic Date Due  . Urine Protein Check  05/04/2019  . DEXA scan (bone density measurement)  04/21/2020*  . Hemoglobin A1C  05/21/2019  . Complete foot exam   10/07/2019  . Eye exam for diabetics  03/08/2020  . Tetanus Vaccine  04/10/2028  . Flu Shot  Completed  . Pneumonia vaccines  Completed  *Topic was postponed. The date shown is not the original due date.

## 2019-04-22 NOTE — Progress Notes (Signed)
Subjective:   Jodi Reeves is a 85 y.o. female who presents for Medicare Annual (Subsequent) preventive examination.  Review of Systems:  No ROS.  Medicare Wellness Virtual Visit.  Visual/audio telehealth visit, UTA vital signs.   Wt/Ht provided by patient.  See social history for additional risk factors.   Cardiac Risk Factors include: advanced age (>39men, >38 women);diabetes mellitus;hypertension     Objective:     Vitals: Ht 5\' 3"  (1.6 m)   Wt 170 lb (77.1 kg)   BMI 30.11 kg/m   Body mass index is 30.11 kg/m.  Advanced Directives 04/22/2019 03/06/2018 03/18/2017 05/16/2016 10/06/2014 09/24/2014  Does Patient Have a Medical Advance Directive? Yes Yes Yes Yes Yes Yes  Type of Paramedic of [place];Living will - Living will Healthcare Power of [place];Living will  Does patient want to make changes to medical advance directive? No - Patient declined No - Patient declined - - No - Patient declined No - Patient declined  Copy of [place] in Chart? No - copy requested No - copy requested - - No - copy requested No - copy requested    Tobacco Social History   Tobacco Use  Smoking Status Never Smoker  Smokeless Tobacco Never Used     Counseling given: Not Answered   Clinical Intake:  Pre-visit preparation completed: Yes        Diabetes: Yes(Followed by pcp)  How often do you need to have someone help you when you read instructions, pamphlets, or other written materials from your doctor or pharmacy?: 1 - Never  Interpreter Needed?: No     Past Medical History:  Diagnosis Date  . Allergy   . Anxiety   . Balance disorder   . Chronic diastolic CHF (congestive heart failure) ([place])   . Colon polyps   . Coronary artery disease, non-occlusive    a. LHC 11/2003: 10% LAD stenosis, 20% LCx stenosis, and 40% RCA stenosis; b. nuc stress test 12/15: small region of  mild ischemia in the apical region with WMA also noted in the apical region, EF 60%. She declined invasive evaluation at that time  . DM2 (diabetes mellitus, type 2) ([place])   . Fall    04/2017 see careeverywhere [facility] multiple fractures, brusing   . Falls frequently    h/o left shoudler/wrist fracture and left fingers numb  . GERD (gastroesophageal reflux disease)    esophageal web  . 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 History of chicken pox   . History of eating disorder   . HLD (hyperlipidemia)   . HTN (hypertension)   . Hypothyroidism   . IBS (irritable bowel syndrome)    diarrhea   . Incontinence of bowel   . Osteoporosis   . Ovarian cancer ([place])   . Persistent atrial fibrillation ([place])    a. CHADS2VASc = > 8 (CHF, HTN, age x 2, DM, TIA x 2, female); b. on Xarelto  . SBO (small bowel obstruction) ([place])    1998  . TIA (transient ischemic attack)   . UTI (urinary tract infection)   . Venous insufficiency    Past Surgical History:  Procedure Laterality Date  . 2nd look laparotomy  1982  . ABDOMINAL HYSTERECTOMY     for ovarian cancer s/p hysterectomy total 1976 and exp lap 1980  . APPENDECTOMY    . CARDIAC CATHETERIZATION  8/05   neg; a fib  found   . CARPAL TUNNEL RELEASE Left 03/06/2018   Procedure: CARPAL TUNNEL RELEASE ENDOSCOPIC;  Surgeon: Corky Mull, MD;  Location: [facility];  Service: Orthopedics;  Laterality: Left;  diabetic - oral meds  . CARPAL TUNNEL RELEASE     left CTS Dr.Poggi  . cataract OD  2002  . cataract surgery  5/07   R  . CHOLECYSTECTOMY  1986  . DEXA  9/04 and 1/02  . EGD/dilation/colon  1/06  . ESOPHAGOGASTRODUODENOSCOPY (EGD) WITH PROPOFOL N/A 05/16/2016   Procedure: ESOPHAGOGASTRODUODENOSCOPY (EGD) WITH PROPOFOL with dilation;  Surgeon: Lucilla Lame, MD;  Location: [facility];  Service: Endoscopy;  Laterality: N/A;  . FEMORAL HERNIA REPAIR  1958   R  . fracture L elbow/wrist  2000  . intussception/obstruction   12/98  . JOINT REPLACEMENT     left shoulder  . kidney stone x2  1991  . laminectomy L4-5  1971  . LUMBAR LAMINECTOMY     1970 ruptured disc   . MOUTH SURGERY    . myoview stress  8/05   syncope (-)  . REVERSE SHOULDER ARTHROPLASTY Left 10/06/2014   Procedure: REVERSE SHOULDER ARTHROPLASTY;  Surgeon: Corky Mull, MD;  Location: [facility] OR[facility];  Service: Orthopedics;  Laterality: Left;  . stillbirth  1969  . TOTAL ABDOMINAL HYSTERECTOMY W/ BILATERAL SALPINGOOPHORECTOMY  1980   ovarian cancer  . WRIST FRACTURE SURGERY  11/05   R   Family History  Problem Relation Age of Onset  . Early death Father   . Diabetes Mother   . Heart disease Mother   . Arthritis Brother   . Cancer Brother   . Depression Brother   . Hearing loss Brother   . Arthritis Brother   . Cancer Brother        colon  . Hearing loss Brother   . Cancer Brother   . Diabetes Brother   . Hearing loss Brother   . Heart disease Brother   . Hyperlipidemia Brother   . Heart disease Sister   . Hypertension Sister   . Stroke Sister   . Hearing loss Daughter   . Hypertension Daughter   . Diabetes Paternal Grandfather   . Hearing loss Sister   . Heart disease Sister   . Arthritis Sister   . Depression Sister   . Hearing loss Sister   . Heart disease Sister   . COPD Brother   . Early death Brother   . Early death Brother    Social History   Socioeconomic History  . Marital status: Widowed    Spouse name: Not on file  . Number of children: 1  . Years of education: Not on file  . Highest education level: Not on file  Occupational History  . 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 Smoking status: Never Smoker  . Smokeless tobacco: Never Used  Substance and Sexual Activity  . Alcohol use: No    Comment: may have occasional glass of wine  . Drug use: No  . Sexual activity: Not Currently  Other Topics Concern  . 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 Difficulty of Paying Living Expenses: Not on file  Food Insecurity:   . Worried About Charity fundraiser in the Last Year: Not on file  . Ran Out of Food in the Last Year: Not on file  Transportation Needs:   . Lack of Transportation (Medical): Not on file  . Lack of Transportation (Non-Medical): Not on file  Physical Activity:   . Days of Exercise per Week: Not on file  . Minutes of Exercise per Session: Not on file  Stress:   . Feeling of Stress : Not on file  Social Connections:   . Frequency of Communication with Friends and Family: Not on file  . Frequency of Social Gatherings with Friends and Family: Not on file  . Attends Religious Services: Not on file  . Active Member of Clubs or Organizations: Not on file  . Attends Archivist Meetings: Not on file  . Marital Status: Not on file    Outpatient Encounter Medications as of 04/22/2019  Medication Sig  . acetaminophen (TYLENOL) 500 MG tablet Take 1,000 mg by mouth every 6 (six) hours as needed for moderate pain or headache.  . Ascorbic Acid (VITAMIN C) POWD Take 200 mg by mouth daily.   . bisoprolol (ZEBETA) 5 MG tablet Take 1 tablet (5 mg total) by mouth daily.  . Calcium Citrate 200 MG TABS Take by mouth daily.  Marland Kitchen co-enzyme Q-10 30 MG capsule Take 30 mg by mouth daily.   Marland Kitchen estradiol (ESTRACE) 0.1 MG/GM vaginal cream APPLY 0.5MG  (PEA SIZED AMOUNT) JUST INSIDE THE VAGINA WITH FINGER-TIP ON MONDAY, WEDNESDAY AND FRIDAY NIGHTS.  . Folic Acid-Vit Q000111Q 123456 (FOLPLEX 2.2) 2.2-25-0.5 MG TABS Place under the tongue.  . furosemide (LASIX) 20 MG tablet Take 1 tablet (20 mg  total) by mouth daily as needed.  Marland Kitchen glimepiride (AMARYL) 2 MG tablet Take 2 mg by mouth daily.  Marland Kitchen glucose blood (ONE TOUCH ULTRA TEST) test strip Use to test blood sugar once daily E11.49 (Patient taking differently: Use to test blood sugar twice daily E11.49)  . levothyroxine (SYNTHROID, LEVOTHROID) 50 MCG tablet Take 50 mcg by mouth daily before breakfast.  . losartan (COZAAR) 25 MG tablet Take 0.5 tablets (12.5 mg total) by mouth daily.  . metFORMIN (GLUCOPHAGE) 500 MG tablet 1 pill in the am and 2 pills in pm with food  . mirabegron ER (MYRBETRIQ) 25 MG TB24 tablet Take 1 tablet (25 mg total) by mouth daily.  . Multiple Vitamin (CALCIUM COMPLEX PO) Take 1 tablet by mouth daily.  . NON FORMULARY [place] Supplement B12 Takes qd. B6 qd, and folic acid  . Omega-3 1000 MG CAPS Take 2,000 mg by mouth daily.   . potassium chloride (K-DUR) 10 MEQ tablet Take 1 tablet (10 mEq total) by mouth daily as needed.  . psyllium (METAMUCIL) 58.6 % packet Take 1 packet by mouth daily.  . rivaroxaban (XARELTO) 20 MG TABS tablet Take 1 tablet (20 mg total) by mouth daily with supper.   No facility-administered encounter medications on file as of 04/22/2019.    Activities of Daily Living In your present state of health, do you have any difficulty performing the following activities: 04/22/2019  Hearing? N  Vision? N  Difficulty concentrating or making decisions? Y  Comment Notes she has some difficulty finishing projects and concentrating.  Walking or climbing  stairs? Y  Comment Unsteady gait; cane/walker in use  Dressing or bathing? N  Doing errands, shopping? Y  Comment She does not Physiological scientist and eating ? Y  Comment Family assists with meal prep  Using the Toilet? N  In the past six months, have you accidently leaked urine? Y  Do you have problems with loss of bowel control? N  Managing your Medications? N  Managing your Finances? Y  Comment Family/son assist  Housekeeping or managing  your Housekeeping? Y  Comment Family assist. Paces herself.  Some recent data might be hidden    Patient Care Team: [name], Nino Glow, MD as PCP - General (Internal Medicine) Minna Merritts, MD as Consulting Physician (Cardiology)    Assessment:   This is a routine wellness examination for [place].  Nurse connected with patient 04/22/19 at  1:00 PM EST by a telephone enabled telemedicine application and verified that I am speaking with the correct person using two identifiers. Patient stated full name and DOB. Patient gave permission to continue with virtual visit. Patient's location was at home and Nurse's location was at [place] office.   Patient is alert and oriented x3. Patient denies difficulty focusing or concentrating. Patient likes to read and complete complete word search puzzles for brain stimulation.   Health Maintenance Due: See completed HM at the end of note.   Eye: Visual acuity not assessed. Virtual visit. Followed by their ophthalmologist. Retinopathy- none reported. Visits every 6 months.   Dental: Visits every 6 months.    Hearing: Demonstrates normal hearing during visit.  Safety:  Patient feels safe at home- yes Patient does have smoke detectors at home- yes Patient does wear sunscreen or protective clothing when in direct sunlight - yes Patient does wear seat belt when in a moving vehicle - yes Patient drives- no Adequate lighting in walkways free from debris- yes Grab bars and handrails used as appropriate- yes Ambulates with an assistive device- yes; cane/walker in use.  Cell phone and lifeline on person when ambulating inside/outside of the home- yes  Social: Alcohol intake - no    Smoking history- never   Smokers in home? none Illicit drug use? none  Medication: Taking as directed and without issues.  Pill box in use -yes  Self managed - yes   Covid-19: Precautions and sickness symptoms discussed. Wears mask, social distancing,  hand hygiene as appropriate.   Activities of Daily Living Patient denies needing assistance with: feeding themselves, getting from bed to chair, getting to the toilet, bathing/showering, dressing. Assisted by family/son with preparing meals, managing money and household chores as needed.   Discussed the importance of a healthy diet, water intake and the benefits of aerobic exercise.   Physical activity- no routine.  Diet:  Regular/soft Water: good intake  Other Providers Patient Care Team: [name], Nino Glow, MD as PCP - General (Internal Medicine) Minna Merritts, MD as Consulting Physician (Cardiology)  Exercise Activities and Dietary recommendations Current Exercise Habits: The patient does not participate in regular exercise at present  Goals    . Follow up with Primary Care Provider     As needed       Fall Risk Fall Risk  04/22/2019 12/26/2018 09/24/2018 06/25/2018 10/05/2017  Falls in the past year? 0 0 0 0 Yes  Number falls in past yr: - - - 0 2 or more  Injury with Fall? - - - 0 Yes  Risk Factor Category  - - - -  High Fall Risk  Risk for fall due to : - - - - -  Follow up Falls prevention discussed - - - -   Timed Get Up and Go performed: no, virtual visit  Depression Screen PHQ 2/9 Scores 04/22/2019 12/26/2018 10/05/2017 02/17/2013  PHQ - 2 Score 0 0 0 0     Cognitive Function     6CIT Screen 04/22/2019  What Year? 0 points  What month? 0 points  What time? 0 points  Count back from 20 0 points  Months in reverse 0 points  Repeat phrase 0 points  Total Score 0    Immunization History  Administered Date(s) Administered  . Fluad Quad(high Dose 65+) 01/23/2019  . Influenza Split 01/24/2012  . Influenza Whole 04/25/2003, 01/16/2008  . Influenza,inj,Quad PF,6+ Mos 03/29/2017  . Influenza,inj,quad, With Preservative 02/18/2018  . Influenza-Unspecified 01/30/2014, 01/23/2016, 02/07/2017, 03/29/2017, 02/13/2018  . PPD Test 05/22/2017  . Pneumococcal  Conjugate-13 03/03/2016  . Pneumococcal Polysaccharide-23 04/25/2003  . Td 03/30/2004, 04/24/2004  . Tdap 02/20/2008, 04/10/2018  . Tetanus 03/30/2004  . Zoster 02/05/2008   Screening Tests Health Maintenance  Topic Date Due  . URINE MICROALBUMIN  05/04/2019  . DEXA SCAN  04/21/2020 (Originally 07/27/1994)  . HEMOGLOBIN A1C  05/21/2019  . FOOT EXAM  10/07/2019  . OPHTHALMOLOGY EXAM  03/08/2020  . TETANUS/TDAP  04/10/2028  . INFLUENZA VACCINE  Completed  . PNA vac Low Risk Adult  Completed      Plan:   Keep all routine maintenance appointments.   Follow up 07/08/19 @ 11:00  Medicare Attestation I have personally reviewed: The patient's medical and social history Their use of alcohol, tobacco or illicit drugs Their current medications and supplements The patient's functional ability including ADLs,fall risks, home safety risks, cognitive, and hearing and visual impairment Diet and physical activities Evidence for depression    I have reviewed and discussed with patient certain preventive protocols, quality metrics, and best practice recommendations.   Varney Biles, LPN  [id number]

## 2019-04-25 DIAGNOSIS — Z8673 Personal history of transient ischemic attack (TIA), and cerebral infarction without residual deficits: Secondary | ICD-10-CM | POA: Insufficient documentation

## 2019-05-08 ENCOUNTER — Encounter: Payer: Self-pay | Admitting: Podiatry

## 2019-05-08 ENCOUNTER — Ambulatory Visit: Payer: Medicare HMO | Admitting: Podiatry

## 2019-05-08 ENCOUNTER — Other Ambulatory Visit: Payer: Self-pay

## 2019-05-08 DIAGNOSIS — E1140 Type 2 diabetes mellitus with diabetic neuropathy, unspecified: Secondary | ICD-10-CM | POA: Diagnosis not present

## 2019-05-08 DIAGNOSIS — D689 Coagulation defect, unspecified: Secondary | ICD-10-CM

## 2019-05-08 DIAGNOSIS — M79676 Pain in unspecified toe(s): Secondary | ICD-10-CM | POA: Diagnosis not present

## 2019-05-08 DIAGNOSIS — B351 Tinea unguium: Secondary | ICD-10-CM | POA: Diagnosis not present

## 2019-05-08 DIAGNOSIS — E1149 Type 2 diabetes mellitus with other diabetic neurological complication: Secondary | ICD-10-CM

## 2019-05-08 NOTE — Progress Notes (Signed)
Complaint:  Visit Type: Patient returns to my office for continued preventative foot care services. Complaint: Patient states" my nails have grown long and thick and become painful to walk and wear shoes" Patient has been diagnosed with DM with neuropathy.. The patient presents for preventative foot care services. No changes to ROS.  Patient is taking xarelto.  Patient has not been seen in over 11 months.  Podiatric Exam: Vascular: dorsalis pedis and posterior tibial pulses are palpable bilateral. Capillary return is immediate. Temperature gradient is WNL. Skin turgor WNL  Sensorium: Diminished  Semmes Weinstein monofilament test. Normal tactile sensation bilaterally. Nail Exam: Pt has thick disfigured discolored nails with subungual debris noted bilateral entire nail hallux through fifth toenails Ulcer Exam: There is no evidence of ulcer or pre-ulcerative changes or infection. Orthopedic Exam: Muscle tone and strength are WNL. No limitations in general ROM. No crepitus or effusions noted. Foot type and digits show no abnormalities. Bony prominences are unremarkable.  Skin: No Porokeratosis. No infection or ulcers  Diagnosis:  Onychomycosis, , Pain in right toe, pain in left toes  Treatment & Plan Procedures and Treatment: Consent by patient was obtained for treatment procedures. The patient understood the discussion of treatment and procedures well. All questions were answered thoroughly reviewed. Debridement of mycotic and hypertrophic toenails, 1 through 5 bilateral and clearing of subungual debris. No ulceration, no infection noted.   Return Visit-Office Procedure: Patient instructed to return to the office for a follow up visit 3 months for continued evaluation and treatment.    Gardiner Barefoot DPM

## 2019-05-26 ENCOUNTER — Other Ambulatory Visit: Payer: Self-pay | Admitting: Cardiovascular Disease

## 2019-05-26 MED ORDER — LOSARTAN POTASSIUM 25 MG PO TABS: 12.5000 mg | ORAL_TABLET | Freq: Every day | ORAL | 0 refills | Status: DC

## 2019-05-26 NOTE — Telephone Encounter (Signed)
Requested Prescriptions   Signed Prescriptions Disp Refills   losartan (COZAAR) 25 MG tablet 45 tablet 0    Sig: Take 0.5 tablets (12.5 mg total) by mouth daily.    Authorizing Provider: Minna Merritts    Ordering User: Raelene Bott, Adalid Beckmann L

## 2019-05-26 NOTE — Telephone Encounter (Signed)
°*  STAT* If patient is at the pharmacy, call can be transferred to refill team.   1. Which medications need to be refilled? (please list name of each medication and dose if known) losartan 12.5 mg po q d   2. Which pharmacy/location (including street and city if local pharmacy) is medication to be sent to? [address] [place]   3. Do they need a 30 day or 90 day supply? [place]

## 2019-05-27 NOTE — Progress Notes (Signed)
I Cardiology Office Note  Date:  05/28/2019   ID:  Jodi Reeves, DOB Nov 14, 1927, MRN [id number]  PCP:  [name], Nino Glow, MD   Chief Complaint  Patient presents with  . OTHER    Fluctuating BP and edema ankles. Meds reviewed verbally with pt.    HPI:  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  CT chest 04/20/2016, Calcifications of the aortic arch, mild calcification of the coronary arteries. Moderate calcifications of the abdominal aorta, which is tortuous. Previous stretching of esophagus with Dr. Candace Cruise Chronic leg swelling, venous/lymphedema She presents for follow-up of her atrial fibrillation and chest pain, leg swelling  Presents in wheelchair on today's visit Has a cane Denies any falls  Lives alone Bought "wraps", she can get them on, does not seem to work well Wears compression hose  Weight up 7-8 pounds Leg swelling worse, Takes lasix 20 daily (skips rare day) Overactive bladder  Previous wound left lower extremity has healed No other ulcerations  No chest pain, chronic shortness of breath with exertion  EKG personally reviewed by myself on todays visit Atrial fibrillation rate 84  The past medical history reviewed Prior echo RVSP 47 mm Hg, EF >60% HBA1C 7.4 CR 0.79,   Declined cholesterol medication in the past Still on Xarelto   emergency room March 18, 2017 for chest pain Hospital records reviewed with the patient in detail Cardiac enzymes negative  Outpatient stress test low risk no ischemia  Previous  falls , Went to [facility], , Sacral fx Left face bruising, severe, Xarelto held Did not qualify for placement/SNF   Initially required 24 hour care at home walking with a walker,    PT 3x per week, OT,    RN 2  -3 x per week  CT scan chest March 18, 2017 with cardiomegaly and coronary artery calcification, aortic atherosclerosis  Chronic GI issues Previously had diarrhea, now with constipation. Working with GI  CT chest 04/20/2016: Calcifications of the aortic arch. mild calcification of the coronary arteries. Moderate calcifications of the abdominal aorta, which is tortuous. The origins of the celiac, SMA, single bilateral renal arteries are patent. The origin of the IMA is not seen, and may be occluded chronically.   TIA in January 2017, went to [facility] for evaluation She was talking with a friend, had word finding difficulty, friend was finishing her sentences Workup at [facility] reviewed She had CT scan of her head which was essentially normal,  carotid ultrasound that showed mild bilateral disease, no significant stenoses,  echocardiogram with ejection fraction greater than 65%, mild valve abnormality    PMH:   has a past medical history of Allergy, Anxiety, Balance disorder, Chronic diastolic CHF (congestive heart failure) ([place]), Colon polyps, Coronary artery disease, non-occlusive, DM2 (diabetes mellitus, type 2) ([place]), Fall, Falls frequently, GERD (gastroesophageal reflux disease), Granuloma annulare, History of chicken pox, History of eating disorder, HLD (hyperlipidemia), HTN (hypertension), Hypothyroidism, IBS (irritable bowel syndrome), Incontinence of bowel, Osteoporosis, Ovarian cancer ([place]), Persistent atrial fibrillation ([place]), SBO (small bowel obstruction) ([place]), TIA (transient ischemic attack), UTI (urinary tract infection), and Venous insufficiency.  PSH:    Past Surgical History:  Procedure Laterality Date  . 2nd look laparotomy  1982  . ABDOMINAL HYSTERECTOMY     for ovarian cancer s/p hysterectomy total 1976 and  exp lap 1980  . APPENDECTOMY    . CARDIAC CATHETERIZATION  8/05   neg; a fib found   . CARPAL TUNNEL RELEASE Left 03/06/2018   Procedure: CARPAL TUNNEL RELEASE  ENDOSCOPIC;  Surgeon: Corky Mull, MD;  Location: [place];  Service: Orthopedics;  Laterality: Left;  diabetic - oral meds  . CARPAL TUNNEL RELEASE     left CTS Dr.Poggi  . cataract OD  2002  . cataract surgery  5/07   R  . CHOLECYSTECTOMY  1986  . DEXA  9/04 and 1/02  . EGD/dilation/colon  1/06  . ESOPHAGOGASTRODUODENOSCOPY (EGD) WITH PROPOFOL N/A 05/16/2016   Procedure: ESOPHAGOGASTRODUODENOSCOPY (EGD) WITH PROPOFOL with dilation;  Surgeon: Lucilla Lame, MD;  Location: [facility] END[facility];  Service: Endoscopy;  Laterality: N/A;  . FEMORAL HERNIA REPAIR  1958   R  . fracture L elbow/wrist  2000  . intussception/obstruction  12/98  . JOINT REPLACEMENT     left shoulder  . kidney stone x2  1991  . laminectomy L4-5  1971  . LUMBAR LAMINECTOMY     1970 ruptured disc   . MOUTH SURGERY    . myoview stress  8/05   syncope (-)  . REVERSE SHOULDER ARTHROPLASTY Left 10/06/2014   Procedure: REVERSE SHOULDER ARTHROPLASTY;  Surgeon: Corky Mull, MD;  Location: [facility] ORS;  Service: Orthopedics;  Laterality: Left;  . stillbirth  1969  . TOTAL ABDOMINAL HYSTERECTOMY W/ BILATERAL SALPINGOOPHORECTOMY  1980   ovarian cancer  . WRIST FRACTURE SURGERY  11/05   R    Current Outpatient Medications  Medication Sig Dispense Refill  . acetaminophen (TYLENOL) 500 MG tablet Take 1,000 mg by mouth every 6 (six) hours as needed for moderate pain or headache.    . Ascorbic Acid (VITAMIN C) POWD Take 200 mg by mouth daily.     . bisoprolol (ZEBETA) 5 MG tablet Take 1 tablet (5 mg total) by mouth daily. 90 tablet 3  . Calcium Citrate 200 MG TABS Take by mouth daily.    Marland Kitchen co-enzyme Q-10 30 MG capsule Take 30 mg by mouth daily.     Marland Kitchen estradiol (ESTRACE) 0.1 MG/GM vaginal cream APPLY 0.5MG  (PEA SIZED AMOUNT) JUST INSIDE THE VAGINA WITH FINGER-TIP ON MONDAY, WEDNESDAY AND FRIDAY NIGHTS. 42.5 g 0  . furosemide (LASIX) 20 MG tablet Take 1 tablet (20 mg total) by mouth daily as needed. 90 tablet 3  .  glimepiride (AMARYL) 2 MG tablet Take 2 mg by mouth daily.    Marland Kitchen glucose blood (ONE TOUCH ULTRA TEST) test strip Use to test blood sugar once daily E11.49 (Patient taking differently: Use to test blood sugar twice daily E11.49) 100 each 1  . levothyroxine (SYNTHROID, LEVOTHROID) 50 MCG tablet Take 50 mcg by mouth daily before breakfast.    . losartan (COZAAR) 25 MG tablet Take 0.5 tablets (12.5 mg total) by mouth daily. 45 tablet 0  . metFORMIN (GLUCOPHAGE) 500 MG tablet Take by mouth.    . Multiple Vitamin (CALCIUM COMPLEX PO) Take 1 tablet by mouth daily.    . NON FORMULARY [place] Supplement B12 Takes qd. B6 qd, and folic acid    . Omega-3 1000 MG CAPS Take 2,000 mg by mouth daily.     . psyllium (METAMUCIL) 58.6 % packet Take 1 packet by mouth daily.    . rivaroxaban (XARELTO) 20 MG TABS tablet Take 1 tablet (20 mg total) by mouth daily with supper. 30 tablet 6  . potassium chloride (K-DUR) 10  MEQ tablet Take 1 tablet (10 mEq total) by mouth daily as needed. 90 tablet 3   No current facility-administered medications for this visit.    Allergies:   Penicillins, Actos [pioglitazone], Amaryl [glimepiride], Amiodarone, Atorvastatin, Ciprofloxacin, Codeine, Ezetimibe-simvastatin, Fosamax [alendronate], Lovastatin, Macrobid [nitrofurantoin macrocrystal], Protonix [pantoprazole sodium], Rosiglitazone maleate, Statins, Sulfa antibiotics, Victoza [liraglutide], Alendronate sodium, and Bactrim [sulfamethoxazole-trimethoprim]   Social History:  The patient  reports that she has never smoked. She has never used smokeless tobacco. She reports that she does not drink alcohol or use drugs.   Family History:   family history includes Arthritis in her brother, brother, and sister; COPD in her brother; Cancer in her brother, brother, and brother; Depression in her brother and sister; Diabetes in her brother, mother, and paternal grandfather; Early death in her brother, brother, and father; Hearing loss in her  brother, brother, brother, daughter, sister, and sister; Heart disease in her brother, mother, sister, sister, and sister; Hyperlipidemia in her brother; Hypertension in her daughter and sister; Stroke in her sister.    Review of Systems: Review of Systems  Constitutional:       Weight gain  HENT: Negative.   Respiratory: Negative.   Cardiovascular: Positive for leg swelling.  Gastrointestinal: Negative.   Musculoskeletal: Negative.        Unsteady gait  Neurological: Negative.   Psychiatric/Behavioral: Negative.  Substance abuse: .bpok.  All other systems reviewed and are negative.   PHYSICAL EXAM: VS:  BP (!) 160/74 (BP Location: Left Arm, Patient Position: Sitting, Cuff Size: Large)   Pulse 84   Ht 5\' 3"  (1.6 m)   Wt 175 lb 4 oz (79.5 kg)   SpO2 97%   BMI 31.04 kg/m  , BMI Body mass index is 31.04 kg/m. Constitutional:  oriented to person, place, and time. No distress.  HENT:  Head: Grossly normal Eyes:  no discharge. No scleral icterus.  Neck: No JVD, no carotid bruits  Cardiovascular: Regular rate and rhythm, no murmurs appreciated 2+ LE edema Pulmonary/Chest: Clear to auscultation bilaterally, no wheezes or rails Abdominal: Soft.  no distension.  no tenderness.  Musculoskeletal: Normal range of motion Neurological:  normal muscle tone. Coordination normal. No atrophy Skin: Skin warm and dry Psychiatric: normal affect, pleasant  Recent Labs: 11/18/2018: ALT 9; BUN 19; Creatinine, Ser 0.79; Hemoglobin 13.3; Platelets 167.0; Potassium 4.1; Sodium 139; TSH 1.61    Lipid Panel Lab Results  Component Value Date   CHOL 183 05/03/2018   HDL 66.90 05/03/2018   LDLCALC 99 05/03/2018   TRIG 87.0 05/03/2018      Wt Readings from Last 3 Encounters:  05/28/19 175 lb 4 oz (79.5 kg)  04/22/19 170 lb (77.1 kg)  03/11/19 170 lb (77.1 kg)      ASSESSMENT AND PLAN:  Pulmonary hypertension Prior Echocardiogram right heart pressures approximately 47 mmHg Current  weight 170 Up 5 pounds We will increase the lasix up to 20 twice a day , potassium twice a day  Mixed hyperlipidemia  CT scan including mild coronary calcifications, aortic arch calcifications, moderate aortic atherosclerosis in the abdominal aorta  does not want a statin  No further workup  Essential hypertension, benign - Plan: EKG 12-Lead Stable at home No medication changes  Persistent atrial fibrillation (HCC) rate contriolled Tolerating Xarelto, no recent falls stable  Chronic diastolic CHF (congestive heart failure) (HCC) Weight stable, chronic leg swelling (venous insuff/lymphedema)  Diabetes: Followed by endocrine, numbers doing better  Lymphedema Referral to vascular for lymphedema compression pumps  Total encounter time more than 25 minutes  Greater than 50% was spent in counseling and coordination of care with the patient  Disposition:   F/U  6 months   No orders of the defined types were placed in this encounter.   Signed, Esmond Plants, M.D., Ph.D. 05/28/2019  [place], [place]

## 2019-05-28 ENCOUNTER — Encounter: Payer: Self-pay | Admitting: Cardiovascular Disease

## 2019-05-28 ENCOUNTER — Other Ambulatory Visit: Payer: Self-pay

## 2019-05-28 ENCOUNTER — Ambulatory Visit (INDEPENDENT_AMBULATORY_CARE_PROVIDER_SITE_OTHER): Payer: Medicare HMO | Admitting: Cardiovascular Disease

## 2019-05-28 VITALS — BP 160/74 | HR 84 | Ht 63.0 in | Wt 175.2 lb

## 2019-05-28 DIAGNOSIS — I10 Essential (primary) hypertension: Secondary | ICD-10-CM

## 2019-05-28 DIAGNOSIS — I700 Atherosclerosis of aorta: Secondary | ICD-10-CM

## 2019-05-28 DIAGNOSIS — E1149 Type 2 diabetes mellitus with other diabetic neurological complication: Secondary | ICD-10-CM | POA: Diagnosis not present

## 2019-05-28 DIAGNOSIS — I272 Pulmonary hypertension, unspecified: Secondary | ICD-10-CM

## 2019-05-28 DIAGNOSIS — I4819 Other persistent atrial fibrillation: Secondary | ICD-10-CM | POA: Diagnosis not present

## 2019-05-28 DIAGNOSIS — I2510 Atherosclerotic heart disease of native coronary artery without angina pectoris: Secondary | ICD-10-CM

## 2019-05-28 DIAGNOSIS — R600 Localized edema: Secondary | ICD-10-CM

## 2019-05-28 DIAGNOSIS — I5032 Chronic diastolic (congestive) heart failure: Secondary | ICD-10-CM

## 2019-05-28 DIAGNOSIS — R079 Chest pain, unspecified: Secondary | ICD-10-CM

## 2019-05-28 MED ORDER — POTASSIUM CHLORIDE ER 10 MEQ PO TBCR: 10.0000 meq | EXTENDED_RELEASE_TABLET | Freq: Two times a day (BID) | ORAL | 3 refills | Status: DC

## 2019-05-28 MED ORDER — FUROSEMIDE 20 MG PO TABS: 20.0000 mg | ORAL_TABLET | Freq: Two times a day (BID) | ORAL | 3 refills | Status: DC

## 2019-05-28 NOTE — Patient Instructions (Addendum)
Referral to Dr. Dew/Schneir (vein and vascular) for lymphedema compression pumps  Medication Instructions:  Please increase the lasix up to 20 mg twice a day with potassium twice a day for weight 165 or higher Back to lasix one and day with potassium one a day for weight <165   If you need a refill on your cardiac medications before your next appointment, please call your pharmacy.    Lab work: No new labs needed   If you have labs (blood work) drawn today and your tests are completely normal, you will receive your results only by: Marland Kitchen MyChart Message (if you have MyChart) OR . A paper copy in the mail If you have any lab test that is abnormal or we need to change your treatment, we will call you to review the results.   Testing/Procedures: No new testing needed   Follow-Up: At [facility], you and your health needs are our priority.  As part of our continuing mission to provide you with exceptional heart care, we have created designated Provider Care Teams.  These Care Teams include your primary Cardiologist (physician) and Advanced Practice Providers (APPs -  Physician Assistants and Nurse Practitioners) who all work together to provide you with the care you need, when you need it.  . You will need a follow up appointment in 6 months   . Providers on your designated Care Team:   . Murray Hodgkins, NP . Christell Faith, PA-C . Marrianne Mood, PA-C  Any Other Special Instructions Will Be Listed Below (If Applicable).  COVID-19 Vaccine Information can be found at: [email or web address] For questions related to vaccine distribution or appointments, please email [email or web address] or call [phone].

## 2019-06-09 ENCOUNTER — Other Ambulatory Visit: Payer: Self-pay

## 2019-06-09 ENCOUNTER — Ambulatory Visit (INDEPENDENT_AMBULATORY_CARE_PROVIDER_SITE_OTHER): Payer: Medicare HMO | Admitting: Vascular Surgery

## 2019-06-09 ENCOUNTER — Encounter (INDEPENDENT_AMBULATORY_CARE_PROVIDER_SITE_OTHER): Payer: Self-pay | Admitting: Vascular Surgery

## 2019-06-09 VITALS — BP 164/84 | HR 75 | Resp 12 | Ht 63.0 in | Wt 171.0 lb

## 2019-06-09 DIAGNOSIS — I872 Venous insufficiency (chronic) (peripheral): Secondary | ICD-10-CM | POA: Insufficient documentation

## 2019-06-09 DIAGNOSIS — I4891 Unspecified atrial fibrillation: Secondary | ICD-10-CM | POA: Diagnosis not present

## 2019-06-09 DIAGNOSIS — E1149 Type 2 diabetes mellitus with other diabetic neurological complication: Secondary | ICD-10-CM

## 2019-06-09 DIAGNOSIS — I2510 Atherosclerotic heart disease of native coronary artery without angina pectoris: Secondary | ICD-10-CM

## 2019-06-09 DIAGNOSIS — I10 Essential (primary) hypertension: Secondary | ICD-10-CM

## 2019-06-09 DIAGNOSIS — I890 Lymphedema, not elsewhere classified: Secondary | ICD-10-CM | POA: Insufficient documentation

## 2019-06-09 NOTE — Progress Notes (Signed)
MRN : [id number]  Jodi Reeves is a 86 y.o. (Apr 20, 1928) female who presents with chief complaint of No chief complaint on file. Marland Kitchen  History of Present Illness:   Patient is seen for evaluation of leg pain and leg swelling. The patient first noticed the swelling remotely. The swelling is associated with pain and discoloration. The pain and swelling worsens with prolonged dependency and improves with elevation. The pain is unrelated to activity.  The patient notes that in the morning the legs are significantly improved but they steadily worsened throughout the course of the day. The patient also notes a steady worsening of the discoloration in the ankle and shin area.   The patient denies claudication symptoms.  The patient denies symptoms consistent with rest pain.  The patient denies an extensive history of DJD and LS spine disease.  The patient has no had any past angiography, interventions or vascular surgery.  Elevation makes the leg symptoms better, dependency makes them much worse. There is no history of ulcerations. The patient denies any recent changes in medications.  The patient has been wearing graduated compression for years.  The patient denies a history of DVT or PE. There is no prior history of phlebitis. There is no history of primary lymphedema.  No history of malignancies. No history of trauma or groin or pelvic surgery. There is no history of radiation treatment to the groin or pelvis.  She does have a history of congestive heart failure but cardiology feels that she is optimally managed and that her swelling of the lower extremities is unrelated to her cardiac function.  The patient denies amaurosis fugax or recent TIA symptoms. There are no recent neurological changes noted. The patient denies recent episodes of angina or shortness of breath  No outpatient medications have been marked as taking for the 06/09/19 encounter (Appointment) with Delana Meyer, Dolores Lory, MD.      Past Medical History:  Diagnosis Date  . Allergy   . Anxiety   . Balance disorder   . Chronic diastolic CHF (congestive heart failure) ([place])   . Colon polyps   . Coronary artery disease, non-occlusive    a. LHC 11/2003: 10% LAD stenosis, 20% LCx stenosis, and 40% RCA stenosis; b. nuc stress test 12/15: small region of mild ischemia in the apical region with WMA also noted in the apical region, EF 60%. She declined invasive evaluation at that time  . DM2 (diabetes mellitus, type 2) ([place])   . Fall    04/2017 see care[facility]where [facility] multiple fractures, brusing   . Falls frequently    h/o left shoudler/wrist fracture and left fingers numb  . GERD (gastroesophageal reflux disease)    esophageal web  . 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 History of chicken pox   . History of eating disorder   . HLD (hyperlipidemia)   . HTN (hypertension)   . Hypothyroidism   . IBS (irritable bowel syndrome)    diarrhea   . Incontinence of bowel   . Osteoporosis   . Ovarian cancer ([place])   . Persistent atrial fibrillation ([place])    a. CHADS2VASc = > 8 (CHF, HTN, age x 2, DM, TIA x 2, female); b. on Xarelto  . SBO (small bowel obstruction) ([place])    1998  . TIA (transient ischemic attack)   . UTI (urinary tract infection)   . Venous insufficiency     Past Surgical History:  Procedure Laterality Date  .  2nd look laparotomy  1982  . ABDOMINAL HYSTERECTOMY     for ovarian cancer s/p hysterectomy total 1976 and exp lap 1980  . APPENDECTOMY    . CARDIAC CATHETERIZATION  8/05   neg; a fib found   . CARPAL TUNNEL RELEASE Left 03/06/2018   Procedure: CARPAL TUNNEL RELEASE ENDOSCOPIC;  Surgeon: Corky Mull, MD;  Location: [facility];  Service: Orthopedics;  Laterality: Left;  diabetic - oral meds  . CARPAL TUNNEL RELEASE     left CTS Dr.Poggi  . cataract OD  2002  . cataract surgery  5/07   R  . CHOLECYSTECTOMY  1986  . DEXA  9/04 and 1/02  . EGD/dilation/colon  1/06  .  ESOPHAGOGASTRODUODENOSCOPY (EGD) WITH PROPOFOL N/A 05/16/2016   Procedure: ESOPHAGOGASTRODUODENOSCOPY (EGD) WITH PROPOFOL with dilation;  Surgeon: Lucilla Lame, MD;  Location: [facility];  Service: Endoscopy;  Laterality: N/A;  . FEMORAL HERNIA REPAIR  1958   R  . fracture L elbow/wrist  2000  . intussception/obstruction  12/98  . JOINT REPLACEMENT     left shoulder  . kidney stone x2  1991  . laminectomy L4-5  1971  . LUMBAR LAMINECTOMY     1970 ruptured disc   . MOUTH SURGERY    . myoview stress  8/05   syncope (-)  . REVERSE SHOULDER ARTHROPLASTY Left 10/06/2014   Procedure: REVERSE SHOULDER ARTHROPLASTY;  Surgeon: Corky Mull, MD;  Location: [facility];  Service: Orthopedics;  Laterality: Left;  . stillbirth  1969  . TOTAL ABDOMINAL HYSTERECTOMY W/ BILATERAL SALPINGOOPHORECTOMY  1980   ovarian cancer  . WRIST FRACTURE SURGERY  11/05   R    Social History Social History   Tobacco Use  . Smoking status: Never Smoker  . Smokeless tobacco: Never Used  Substance Use Topics  . Alcohol use: No    Comment: may have occasional glass of wine  . Drug use: No    Family History Family History  Problem Relation Age of Onset  . Early death Father   . Diabetes Mother   . Heart disease Mother   . Arthritis Brother   . Cancer Brother   . Depression Brother   . Hearing loss Brother   . Arthritis Brother   . Cancer Brother        colon  . Hearing loss Brother   . Cancer Brother   . Diabetes Brother   . Hearing loss Brother   . Heart disease Brother   . Hyperlipidemia Brother   . Heart disease Sister   . Hypertension Sister   . Stroke Sister   . Hearing loss Daughter   . Hypertension Daughter   . Diabetes Paternal Grandfather   . Hearing loss Sister   . Heart disease Sister   . Arthritis Sister   . Depression Sister   . Hearing loss Sister   . Heart disease Sister   . COPD Brother   . Early death Brother   . Early death Brother   No family history of  bleeding/clotting disorders, porphyria or autoimmune disease   Allergies  Allergen Reactions  . Penicillins Anaphylaxis and Other (See Comments)    Has patient had a PCN reaction causing immediate rash, facial/tongue/throat swelling, SOB or lightheadedness with hypotension: Yes Has patient had a PCN reaction causing severe rash involving mucus membranes or skin necrosis: No Has patient had a PCN reaction that required hospitalization: No Has patient had a PCN reaction occurring within the last  10 years: No If all of the above answers are "NO", then may proceed with Cephalosporin use.   . Actos [Pioglitazone]     Not effective   . Amaryl [Glimepiride]     Not effective    . Amiodarone     Severe Thyroid issues   . Atorvastatin Other (See Comments)    Muscle aches & All statins per Patient  . Ciprofloxacin Other (See Comments)    High blood pressure, aches chest pressure    . Codeine Nausea And Vomiting  . Ezetimibe-Simvastatin Other (See Comments)    Muscle aches, nausea, back pain   . Fosamax [Alendronate]     dysphagia  . Lovastatin Other (See Comments)    Myalgias  . Macrobid [Nitrofurantoin Macrocrystal]     Severe Itching, rash   . Protonix [Pantoprazole Sodium]     Esophageal problems    . Rosiglitazone Maleate Other (See Comments)    Edema  . Statins     Muscle and joint aches to all statins  . Sulfa Antibiotics     Itching   . Victoza [Liraglutide]     Nausea   . Alendronate Sodium Rash    Dysphagia and ulceration   . Bactrim [Sulfamethoxazole-Trimethoprim] Rash    itching     REVIEW OF SYSTEMS (Negative unless checked)  Constitutional: [] Weight loss  [] Fever  [] Chills Cardiac: [] Chest pain   [] Chest pressure   [] Palpitations   [] Shortness of breath when laying flat   [] Shortness of breath with exertion. Vascular:  [] Pain in legs with walking   [] Pain in legs at rest  [] History of DVT   [] Phlebitis   [] Swelling in legs   [] Varicose veins    [] Non-healing ulcers Pulmonary:   [] Uses home oxygen   [] Productive cough   [] Hemoptysis   [] Wheeze  [] COPD   [] Asthma Neurologic:  [] Dizziness   [] Seizures   [] History of stroke   [] History of TIA  [] Aphasia   [] Vissual changes   [] Weakness or numbness in arm   [] Weakness or numbness in leg Musculoskeletal:   [] Joint swelling   [] Joint pain   [] Low back pain Hematologic:  [] Easy bruising  [] Easy bleeding   [] Hypercoagulable state   [] Anemic Gastrointestinal:  [] Diarrhea   [] Vomiting  [] Gastroesophageal reflux/heartburn   [] Difficulty swallowing. Genitourinary:  [] Chronic kidney disease   [] Difficult urination  [] Frequent urination   [] Blood in urine Skin:  [] Rashes   [] Ulcers  Psychological:  [] History of anxiety   []  History of major depression.  Physical Examination  There were no vitals filed for this visit. There is no height or weight on file to calculate BMI. Gen: WD/WN, NAD Head: [place]/AT, No temporalis wasting.  Ear/Nose/Throat: Hearing grossly intact, nares w/o erythema or drainage, poor dentition Eyes: PER, EOMI, sclera nonicteric.  Neck: Supple, no masses.  No bruit or JVD.  Pulmonary:  Good air movement, clear to auscultation bilaterally, no use of accessory muscles.  Cardiac: RRR, normal S1, S2, no Murmurs. Vascular: scattered varicosities present bilaterally.  Moderate venous stasis changes to the legs bilaterally.  2+ soft pitting edema Vessel Right Left  Radial Palpable Palpable  PT Palpable Palpable  DP Palpable Palpable  Gastrointestinal: soft, non-distended. No guarding/no peritoneal signs.  Musculoskeletal: M/S 5/5 throughout.  No deformity or atrophy.  Neurologic: CN 2-12 intact. Pain and light touch intact in extremities.  Symmetrical.  Speech is fluent. Motor exam as listed above. Psychiatric: Judgment intact, Mood & affect appropriate for pt's clinical situation. Dermatologic: No rashes or  ulcers noted.  No changes consistent with cellulitis.  CBC Lab Results    Component Value Date   WBC 6.3 11/18/2018   HGB 13.3 11/18/2018   HCT 39.9 11/18/2018   MCV 100.8 (H) 11/18/2018   PLT 167.0 11/18/2018    BMET    Component Value Date/Time   NA 139 11/18/2018 1026   K 4.1 11/18/2018 1026   CL 102 11/18/2018 1026   CO2 27 11/18/2018 1026   GLUCOSE 156 (H) 11/18/2018 1026   BUN 19 11/18/2018 1026   BUN 13 01/07/2018 1342   CREATININE 0.79 11/18/2018 1026   CALCIUM 9.5 11/18/2018 1026   GFRNONAA 78 01/07/2018 1342   GFRAA 90 01/07/2018 1342   CrCl cannot be calculated (Patient's most recent lab result is older than the maximum 21 days allowed.).  COAG Lab Results  Component Value Date   INR 1.17 10/06/2014   INR 1.70 10/01/2014   INR 2.0 03/26/2012    Radiology No results found.   Assessment/Plan 1. Lymphedema I have had a long discussion with the patient regarding swelling and why it  causes symptoms.  Patient will continue wearing graduated compression stockings class 1 (20-30 mmHg) on a daily basis a prescription was given. The patient will  beginning wearing the stockings first thing in the morning and removing them in the evening. The patient is instructed specifically not to sleep in the stockings.   In addition, behavioral modification will be initiated.  This will include frequent elevation, use of over the counter pain medications and she will begin an exercise program such as walking.  I have reviewed systemic causes for chronic edema such as liver, kidney and cardiac etiologies.  The patient denies problems with these organ systems and again I would note that cardiology feels that she is optimally managed with respect to her cardiac function.    Consideration for a lymph pump will also be made based upon the effectiveness of conservative therapy.  This would help to improve the edema control and prevent sequela such as ulcers and infections   Patient should undergo duplex ultrasound of the venous system to ensure that DVT or  reflux is not present.  The patient will follow-up with me after the ultrasound.    2. Chronic venous insufficiency No surgery or intervention at this point in time.    I have had a long discussion with the patient regarding venous insufficiency and why it  causes symptoms. I have discussed with the patient the chronic skin changes that accompany venous insufficiency and the long term sequela such as infection and ulceration.  Patient will begin wearing graduated compression stockings class 1 (20-30 mmHg) or compression wraps on a daily basis a prescription was given. The patient will put the stockings on first thing in the morning and removing them in the evening. The patient is instructed specifically not to sleep in the stockings.   In addition, behavioral modification including several periods of elevation of the lower extremities during the day will be continued. I have demonstrated that proper elevation is a position with the ankles at heart level.  The patient is instructed to begin routine exercise, especially walking on a daily basis  Patient should undergo duplex ultrasound of the venous system to ensure that DVT or reflux is not present.  Following the review of the ultrasound the patient will follow up in 2-3 months to reassess the degree of swelling and the control that graduated compression stockings or compression wraps  is offering.   The patient can be assessed for a Lymph Pump at that time  3. Type 2 diabetes mellitus with neurological manifestations, controlled ([place]) Continue hypoglycemic medications as already ordered, these medications have been reviewed and there are no changes at this time.  Hgb A1C to be monitored as already arranged by primary service   4. Atrial fibrillation, unspecified type ([place]) Continue antiarrhythmia medications as already ordered, these medications have been reviewed and there are no changes at this time.  Continue anticoagulation as ordered  by Cardiology Service   5. Essential hypertension Continue antihypertensive medications as already ordered, these medications have been reviewed and there are no changes at this time.   6. Coronary artery disease, non-occlusive Continue cardiac and antihypertensive medications as already ordered and reviewed, no changes at this time.  Continue statin as ordered and reviewed, no changes at this time  Nitrates PRN for chest pain    Hortencia Pilar, MD  06/09/2019 1:29 PM

## 2019-06-10 ENCOUNTER — Encounter (INDEPENDENT_AMBULATORY_CARE_PROVIDER_SITE_OTHER): Payer: Self-pay | Admitting: Vascular Surgery

## 2019-06-19 ENCOUNTER — Other Ambulatory Visit: Payer: Self-pay

## 2019-06-19 ENCOUNTER — Ambulatory Visit: Payer: Medicare HMO | Admitting: Podiatry

## 2019-06-19 ENCOUNTER — Encounter: Payer: Self-pay | Admitting: Podiatry

## 2019-06-19 DIAGNOSIS — E1140 Type 2 diabetes mellitus with diabetic neuropathy, unspecified: Secondary | ICD-10-CM | POA: Diagnosis not present

## 2019-06-19 DIAGNOSIS — L97509 Non-pressure chronic ulcer of other part of unspecified foot with unspecified severity: Secondary | ICD-10-CM | POA: Insufficient documentation

## 2019-06-19 DIAGNOSIS — E1149 Type 2 diabetes mellitus with other diabetic neurological complication: Secondary | ICD-10-CM

## 2019-06-19 DIAGNOSIS — M79676 Pain in unspecified toe(s): Secondary | ICD-10-CM

## 2019-06-19 DIAGNOSIS — L97511 Non-pressure chronic ulcer of other part of right foot limited to breakdown of skin: Secondary | ICD-10-CM

## 2019-06-19 DIAGNOSIS — D689 Coagulation defect, unspecified: Secondary | ICD-10-CM | POA: Diagnosis not present

## 2019-06-19 DIAGNOSIS — B351 Tinea unguium: Secondary | ICD-10-CM

## 2019-06-19 MED ORDER — DOXYCYCLINE HYCLATE 100 MG PO TABS: 100.0000 mg | ORAL_TABLET | Freq: Two times a day (BID) | ORAL | 0 refills | Status: DC

## 2019-06-19 NOTE — Progress Notes (Signed)
This patient presents the office for an evaluation and treatment of her right great toe.  She says this area has started draining on the tip of her right great toe and there is callus tissue noted on the tip of the right big toe.  Patient was seen in this office by myself in January 2021.  Patient states that she noticed this drainage occurring 4 to 5 days ago on her socks.  Since that time it has become red and she is concerned about this skin lesion  since she is diabetic.  This patient has provided no self treatment.  She presents the office today for an evaluation and treatment of this draining open wound right big toe.  Patient states that she has not worn her compression socks for the last 2 days. She says she has vascular study for possible lymphedema pump.  General Appearance  Alert, conversant and in no acute stress.  Vascular  Dorsalis pedis  pulses are weakly  palpable  bilaterally.  Posterior tiibal pulses are absent due to swelling  B/L Capillary return is within normal limits  bilaterally. Temperature is within normal limits  Bilaterally. Lymphedma legs  B/L.  Venous stasis legs  B/L.  Neurologic  Senn-Weinstein monofilament wire test diminished  bilaterally. Muscle power within normal limits bilaterally.  Nails Thick disfigured discolored nails with subungual debris  from hallux to fifth toes bilaterally. No evidence of bacterial infection or drainage bilaterally.  Orthopedic  No limitations of motion  feet .  No crepitus or effusions noted.  No bony pathology or digital deformities noted.  Skin  normotropic skin with no porokeratosis noted bilaterally.  Callus noted distal hallux right foot.  Callus present on distal aspect right hallux with 10 mm. X 6 mm. Ulcer on medial distal aspect right hallux.   Redness and swelling noted encircling ulcer.    Diabetic ulcer right hallux.    Treatment  ROV.  Debridement of necrotic tissue/callus at the distal aspect of the right hallux.  and a   10 mm x 6 mm ulcer was noted at this site surrounded by redness and swelling. Neosporin/DSD.  Patient has a callus noted on the tip of both big toes which I believe are due to her compression socks.  Patient was told to soak her foot in soapy sudsy water and to return to the office in 10 days.  Patient was prescribed doxycycline 100 mg with instructions to take 1 twice daily.  Patient to call the office if problems occur.     Gardiner Barefoot DPM

## 2019-06-26 ENCOUNTER — Encounter: Payer: Self-pay | Admitting: Urology

## 2019-06-26 ENCOUNTER — Other Ambulatory Visit: Payer: Self-pay

## 2019-06-26 ENCOUNTER — Encounter: Payer: Self-pay | Admitting: Podiatry

## 2019-06-26 ENCOUNTER — Ambulatory Visit: Payer: Medicare HMO | Admitting: Podiatry

## 2019-06-26 ENCOUNTER — Ambulatory Visit (INDEPENDENT_AMBULATORY_CARE_PROVIDER_SITE_OTHER): Payer: Medicare HMO | Admitting: Urology

## 2019-06-26 VITALS — BP 165/83 | HR 83 | Ht 63.0 in | Wt 167.0 lb

## 2019-06-26 VITALS — Temp 96.1°F

## 2019-06-26 DIAGNOSIS — E1140 Type 2 diabetes mellitus with diabetic neuropathy, unspecified: Secondary | ICD-10-CM | POA: Diagnosis not present

## 2019-06-26 DIAGNOSIS — D689 Coagulation defect, unspecified: Secondary | ICD-10-CM

## 2019-06-26 DIAGNOSIS — L97511 Non-pressure chronic ulcer of other part of right foot limited to breakdown of skin: Secondary | ICD-10-CM

## 2019-06-26 DIAGNOSIS — Z87440 Personal history of urinary (tract) infections: Secondary | ICD-10-CM | POA: Diagnosis not present

## 2019-06-26 DIAGNOSIS — E1149 Type 2 diabetes mellitus with other diabetic neurological complication: Secondary | ICD-10-CM | POA: Diagnosis not present

## 2019-06-26 DIAGNOSIS — Z87448 Personal history of other diseases of urinary system: Secondary | ICD-10-CM

## 2019-06-26 DIAGNOSIS — N3281 Overactive bladder: Secondary | ICD-10-CM

## 2019-06-26 DIAGNOSIS — R351 Nocturia: Secondary | ICD-10-CM | POA: Diagnosis not present

## 2019-06-26 LAB — BLADDER SCAN AMB NON-IMAGING: Scan Result: 48

## 2019-06-26 NOTE — Progress Notes (Signed)
06/25/2018 9:22 AM   Jodi Reeves 03-20-1928 [id number]  Referring provider: [name], Jodi Glow, MD [name],  [name] [address]  Chief Complaint  Patient presents with  . Nocturia    HPI: Patient is a 86 year old female with a history of hematuria, rUTI's and frequency who presents today for one year follow up.    History of hematuria (high risk) Non-smoker.  CT urogram 12/2017 both adrenal glands appear normal. No urinary tract calculi are identified. No renal parenchymal mass observed. Borderline urinary bladder wall thickening is probably from nondistention. Right greater than left bladder diverticula noted. No abnormal filling defect identified along the urothelium.  Cysto with Dr. Diamantina Reeves 01/2018 NED.   No reports of gross hematuria.   rUTI's No recent reports of UTI's.  She is using vaginal estrogen cream three night weekly.   Patient denies any modifying or aggravating factors.  Patient denies any gross hematuria, dysuria or suprapubic/flank pain.  Patient denies any fevers, chills, nausea or vomiting.   Frequency The patient is  experiencing urgency x 0-3 (improved), frequency x 4-7 (stable), is restricting fluids to avoid visits to the restroom, is engaging in toilet mapping, incontinence x 4-7 (worsening) and nocturia x 4-7 (stable).   Her BP is 165/83.   Her PVR is 48 mL.  She is not longer taking the Myrbetriq.  She is on Lasix for congestive heart failure and also suffers from bilateral lymphedema.    PMH: Past Medical History:  Diagnosis Date  . Allergy   . Anxiety   . Balance disorder   . Chronic diastolic CHF (congestive heart failure) ([place])   . Colon polyps   . Coronary artery disease, non-occlusive    a. LHC 11/2003: 10% LAD stenosis, 20% LCx stenosis, and 40% RCA stenosis; b. nuc stress test 12/15: small region of mild ischemia in the apical region with WMA also noted in the apical region, EF 60%. She declined invasive evaluation at that  time  . DM2 (diabetes mellitus, type 2) ([place])   . Fall    04/2017 see care[facility]where [facility] multiple fractures, brusing   . Falls frequently    h/o left shoudler/wrist fracture and left fingers numb  . GERD (gastroesophageal reflux disease)    esophageal web  . Granuloma annulare    Jodi Reeves. since 2010  . History of chicken pox   . History of eating disorder   . HLD (hyperlipidemia)   . HTN (hypertension)   . Hypothyroidism   . IBS (irritable bowel syndrome)    diarrhea   . Incontinence of bowel   . Osteoporosis   . Ovarian cancer ([place])   . Persistent atrial fibrillation ([place])    a. CHADS2VASc = > 8 (CHF, HTN, age x 2, DM, TIA x 2, female); b. on Xarelto  . SBO (small bowel obstruction) ([place])    1998  . TIA (transient ischemic attack)   . UTI (urinary tract infection)   . Venous insufficiency     Surgical History: Past Surgical History:  Procedure Laterality Date  . 2nd look laparotomy  1982  . ABDOMINAL HYSTERECTOMY     for ovarian cancer s/p hysterectomy total 1976 and exp lap 1980  . APPENDECTOMY    . CARDIAC CATHETERIZATION  8/05   neg; a fib found   . CARPAL TUNNEL RELEASE Left 03/06/2018   Procedure: CARPAL TUNNEL RELEASE ENDOSCOPIC;  Surgeon: Jodi Mull, MD;  Location: [facility];  Service: Orthopedics;  Laterality: Left;  diabetic - oral meds  . CARPAL TUNNEL RELEASE     left CTS JodiPoggi  . cataract OD  2002  . cataract surgery  5/07   R  . CHOLECYSTECTOMY  1986  . DEXA  9/04 and 1/02  . EGD/dilation/colon  1/06  . ESOPHAGOGASTRODUODENOSCOPY (EGD) WITH PROPOFOL N/A 05/16/2016   Procedure: ESOPHAGOGASTRODUODENOSCOPY (EGD) WITH PROPOFOL with dilation;  Surgeon: Jodi Lame, MD;  Location: [facility];  Service: Endoscopy;  Laterality: N/A;  . FEMORAL HERNIA REPAIR  1958   R  . fracture L elbow/wrist  2000  . intussception/obstruction  12/98  . JOINT REPLACEMENT     left shoulder  . kidney stone x2  1991  . laminectomy L4-5  1971  .  LUMBAR LAMINECTOMY     1970 ruptured disc   . MOUTH SURGERY    . myoview stress  8/05   syncope (-)  . REVERSE SHOULDER ARTHROPLASTY Left 10/06/2014   Procedure: REVERSE SHOULDER ARTHROPLASTY;  Surgeon: Jodi Mull, MD;  Location: [facility];  Service: Orthopedics;  Laterality: Left;  . stillbirth  1969  . TOTAL ABDOMINAL HYSTERECTOMY W/ BILATERAL SALPINGOOPHORECTOMY  1980   ovarian cancer  . WRIST FRACTURE SURGERY  11/05   R    Home Medications:  Allergies as of 06/26/2019      Reactions   Amiodarone Other (See Comments)   Severe Thyroid issues    Penicillins Anaphylaxis, Other (See Comments)   Has patient had a PCN reaction causing immediate rash, facial/tongue/throat swelling, SOB or lightheadedness with hypotension: Yes Has patient had a PCN reaction causing severe rash involving mucus membranes or skin necrosis: No Has patient had a PCN reaction that required hospitalization: No Has patient had a PCN reaction occurring within the last 10 years: No If all of the above answers are "NO", then may proceed with Cephalosporin use.   Actos [pioglitazone]    Not effective    Amaryl [glimepiride]    Not effective    Atorvastatin Other (See Comments)   Muscle aches & All statins per Patient   Ciprofloxacin Other (See Comments)   High blood pressure    Codeine Nausea And Vomiting   Ezetimibe-simvastatin Other (See Comments)   Muscle aches, nausea, back pain    Fosamax [alendronate]    dysphagia   Lovastatin Other (See Comments)   Myalgias   Macrobid [nitrofurantoin Macrocrystal]    Severe Itching, rash   Protonix [pantoprazole Sodium]    Esophageal problems    Rosiglitazone Maleate Other (See Comments)   Edema   Statins    Muscle and joint aches to all statins   Victoza [liraglutide]    Nausea   Alendronate Sodium Rash   Dysphagia and ulceration    Bactrim [sulfamethoxazole-trimethoprim] Rash   itching   Sulfa Antibiotics Rash   Itching      Medication List        Accurate as of June 26, 2019 11:59 PM. If you have any questions, ask your nurse or doctor.        STOP taking these medications   Calcium Citrate 200 MG Tabs Stopped by: Jodi Glad, PA-C   glimepiride 2 MG tablet Commonly known as: AMARYL Stopped by: Jodi Council, PA-C     TAKE these medications   acetaminophen 500 MG tablet Commonly known as: TYLENOL Take 1,000 mg by mouth every 6 (six) hours as needed for moderate pain or headache.   bisoprolol 5 MG tablet Commonly known as: ZEBETA Take  1 tablet (5 mg total) by mouth daily.   calcium carbonate 500 MG chewable tablet Commonly known as: TUMS - dosed in mg elemental calcium Chew 1 tablet by mouth daily.   CALCIUM COMPLEX PO Take 1 tablet by mouth daily.   co-enzyme Q-10 30 MG capsule Take 30 mg by mouth daily.   doxycycline 100 MG capsule Commonly known as: VIBRAMYCIN Take 100 mg by mouth 2 (two) times daily. What changed: Another medication with the same name was removed. Continue taking this medication, and follow the directions you see here. Changed by: Gardiner Barefoot, DPM   estradiol 0.1 MG/GM vaginal cream Commonly known as: ESTRACE APPLY 0.5MG  (PEA SIZED AMOUNT) JUST INSIDE THE VAGINA WITH FINGER-TIP ON Reeves, WEDNESDAY AND FRIDAY NIGHTS.   furosemide 20 MG tablet Commonly known as: LASIX Take 1 tablet (20 mg total) by mouth 2 (two) times daily.   glucose blood test strip Commonly known as: ONE TOUCH ULTRA TEST Use to test blood sugar once daily E11.49 What changed: additional instructions   levothyroxine 50 MCG tablet Commonly known as: SYNTHROID Take 50 mcg by mouth daily before breakfast.   losartan 25 MG tablet Commonly known as: COZAAR Take 0.5 tablets (12.5 mg total) by mouth daily.   metFORMIN 500 MG 24 hr tablet Commonly known as: GLUCOPHAGE-XR Take 1,000 mg by mouth 2 (two) times daily.   NON FORMULARY [place] Supplement B12 Takes qd. B6 qd, and folic acid   Omega-3 [id number] MG  Caps Take 2,000 mg by mouth daily.   potassium chloride 10 MEQ tablet Commonly known as: KLOR-CON Take 1 tablet (10 mEq total) by mouth 2 (two) times daily.   psyllium 58.6 % packet Commonly known as: METAMUCIL Take 1 packet by mouth 2 (two) times daily.   rivaroxaban 20 MG Tabs tablet Commonly known as: Xarelto Take 1 tablet (20 mg total) by mouth daily with supper.   Tradjenta 5 MG Tabs tablet Generic drug: linagliptin Take by mouth.   Vitamin C Powd Take 200 mg by mouth daily.       Allergies:  Allergies  Allergen Reactions  . Amiodarone Other (See Comments)    Severe Thyroid issues   . Penicillins Anaphylaxis and Other (See Comments)    Has patient had a PCN reaction causing immediate rash, facial/tongue/throat swelling, SOB or lightheadedness with hypotension: Yes Has patient had a PCN reaction causing severe rash involving mucus membranes or skin necrosis: No Has patient had a PCN reaction that required hospitalization: No Has patient had a PCN reaction occurring within the last 10 years: No If all of the above answers are "NO", then may proceed with Cephalosporin use.   . Actos [Pioglitazone]     Not effective   . Amaryl [Glimepiride]     Not effective    . Atorvastatin Other (See Comments)    Muscle aches & All statins per Patient  . Ciprofloxacin Other (See Comments)    High blood pressure    . Codeine Nausea And Vomiting  . Ezetimibe-Simvastatin Other (See Comments)    Muscle aches, nausea, back pain   . Fosamax [Alendronate]     dysphagia  . Lovastatin Other (See Comments)    Myalgias  . Macrobid [Nitrofurantoin Macrocrystal]     Severe Itching, rash   . Protonix [Pantoprazole Sodium]     Esophageal problems    . Rosiglitazone Maleate Other (See Comments)    Edema  . Statins     Muscle and joint aches to all statins  .  Victoza [Liraglutide]     Nausea   . Alendronate Sodium Rash    Dysphagia and ulceration   . Bactrim  [Sulfamethoxazole-Trimethoprim] Rash    itching  . Sulfa Antibiotics Rash    Itching     Family History: Family History  Problem Relation Age of Onset  . Early death Father   . Diabetes Mother   . Heart disease Mother   . Arthritis Brother   . Cancer Brother   . Depression Brother   . Hearing loss Brother   . Arthritis Brother   . Cancer Brother        colon  . Hearing loss Brother   . Cancer Brother   . Diabetes Brother   . Hearing loss Brother   . Heart disease Brother   . Hyperlipidemia Brother   . Heart disease Sister   . Hypertension Sister   . Stroke Sister   . Hearing loss Daughter   . Hypertension Daughter   . Diabetes Paternal Grandfather   . Hearing loss Sister   . Heart disease Sister   . Arthritis Sister   . Depression Sister   . Hearing loss Sister   . Heart disease Sister   . COPD Brother   . Early death Brother   . Early death Brother     Social History:  reports that she has never smoked. She has never used smokeless tobacco. She reports that she does not drink alcohol or use drugs.  ROS: For pertinent review of systems please refer to history of present illness  Physical Exam: BP (!) 165/83   Pulse 83   Ht 5\' 3"  (1.6 m)   Wt 167 lb (75.8 kg)   BMI 29.58 kg/m   Constitutional:  Well nourished. Alert and oriented, No acute distress. HEENT: [place] AT, moist mucus membranes.  Trachea midline, no masses. Cardiovascular: No clubbing, cyanosis, or edema. Respiratory: Normal respiratory effort, no increased work of breathing. Neurologic: Grossly intact, no focal deficits, moving all 4 extremities. Psychiatric: Normal mood and affect.   Laboratory Data: Lab Results  Component Value Date   WBC 6.3 11/18/2018   HGB 13.3 11/18/2018   HCT 39.9 11/18/2018   MCV 100.8 (H) 11/18/2018   PLT 167.0 11/18/2018    Lab Results  Component Value Date   CREATININE 0.79 11/18/2018   Lab Results  Component Value Date   HGBA1C 7.4 (H) 11/18/2018     Lab Results  Component Value Date   TSH 1.61 11/18/2018       Component Value Date/Time   CHOL 183 05/03/2018 1119   CHOL 187 11/17/2011 0849   HDL 66.90 05/03/2018 1119   HDL 56 11/17/2011 0849   CHOLHDL 3 05/03/2018 1119   VLDL 17.4 05/03/2018 1119   LDLCALC 99 05/03/2018 1119   LDLCALC 104 (H) 11/17/2011 0849    Lab Results  Component Value Date   AST 23 11/18/2018   Lab Results  Component Value Date   ALT 9 11/18/2018   I have reviewed the labs.  Pertinent Imaging: Results for CHAMPAIGNE, KISSEE (MRN [id number]) as of 07/02/2019 09:14  Ref. Range 06/26/2019 13:51  Scan Result Unknown 48    Assessment & Plan:    1. Frequency/Nocturia - she is not wanting to talk any medications at this time for her urinary symptoms - It will be difficult to manage her incontinence with her Lasix and lymphedema, so she will continue to manage conservatively at this time - she will follow  up in one year  2. rUTI's - Has been evaluated by Infectious Disease and they have recommended starting vaginal estrogen cream, urodynamics, and ensuring patient empties her bladder.  - Urodynamic study deferred due to the patients transportation issues.  - PVRs minimal in the office.  - She has continued the vaginal estrogen cream (Estrace)  3. History of hematuria - Hematuria work up completed in 01/29/2018 - with no concerning findings on cystoscopy.  - No report of gross hematuria  - RTC in one year - patient to report any gross hematuria in the interim                                          Return in about 1 year (around 06/25/2020) for PVR and OAB questionnaire.  Laneta Simmers  [facility] [address]  [place], [place] [address] [phone]

## 2019-06-26 NOTE — Progress Notes (Signed)
This patient presents the office today for a diabetic ulcer on the tip of her right great toe.  The ulcer and the right great toe were  initially evaluated and treated 1 week ago.  She presented to the office one week ago and  diagnosis of a  cellulitis with a diabetic ulcer right big toe  was noted.  Patient was treated with doxycycline which she has taken without any allergic reactions.  She says the redness and the swelling has diminished.  She states that there was drainage from the diabetic ulcer site last night.  She presents the office saying she received instructions on diabetic soaks yesterday.  Upon examination of the soaks sheet there were circles around #1 through 4 which I circled at her last visit.  She says she has been soaking and bandaging her foot as prescribed.  She has also stopped wearing her compression socks.  She returns to the office today for continued evaluation and treatment of her diabetic ulcer right hallux.  Patient has previous diagnosis of diabetic neuropathy with venous insufficiency as well as lymphedema.  Her right leg is significantly swollen at this visit.  Patient also has been taking Xarelto.  General Appearance  Alert, conversant and in no acute stress.  Vascular  Dorsalis pedis and posterior tibial  pulses are weakly  palpable  bilaterally.  Capillary return is within normal limits  bilaterally. Temperature is within normal limits  Bilaterally.  Significant swelling due to lymphedema and venous insufficiency.right leg greater than left leg.  Neurologic  Senn-Weinstein monofilament wire test within normal limits  bilaterally. Muscle power within normal limits bilaterally.  Nails Thick disfigured discolored nails with subungual debris  from hallux to fifth toes bilaterally. No evidence of bacterial infection or drainage bilaterally.  Orthopedic  No limitations of motion  feet .  No crepitus or effusions noted.  No bony pathology or digital deformities noted.  Skin   normotropic skin with no porokeratosis noted bilaterally.  No signs of infections or ulcers noted.  Redness and swelling in area encircling the ulcer has resolved.  There is necrotic tissue  lateral to diabetic ulcer. Ulcer on the distal hallux right still measures 10 mm. X 6 mm.  The whole ulcer with healing skin measures 20 mm. X 10  Mm.  There is healthy granulation tissue at ulcer site.   Callus noted on distal aspect left hallux.  No infection.  Diabetic ulcer right hallux.  Cellulitis resolved.    ROV  Debride necrotic skin and remeasured ulcer.  Neosporin/DSD.   Told her to continue with antibiotics until they are completely taken.  This patient will be seen back in the office in 1 week.  I am concerned for this patient about following soaking instructions and bandaging instructions.  I proceeded to dispense a surgical shoe to help to eliminate any further pressure on the distal aspect of the right hallux. She was told to wear this shoe when walking.  If this problem persists I believe I may need to set her up with a home nurse to care for this toe.  I believe healing of this toe may be difficult due to the severe swelling in her right leg and her limited vascular status.  If this ulcer persists I can also consider referral to Dr. Posey Pronto for him to treat her associated medical conditions as well as the ulcer.   Gardiner Barefoot DPM

## 2019-07-02 ENCOUNTER — Ambulatory Visit: Payer: Medicare HMO | Admitting: Cardiovascular Disease

## 2019-07-02 ENCOUNTER — Telehealth: Payer: Self-pay | Admitting: Cardiovascular Disease

## 2019-07-02 NOTE — Telephone Encounter (Signed)
Patient showed up at office thinking she had appointment scheduled for today. She was very upset and had several questions. She handed me two small notebooks of blood pressure readings all of which were normal range. She then said that she did go to [facility] and now has wounds on her feet and sees podiatry as well. She is receiving wound care and they discouraged her from wearing compression hose. Reviewed ace wraps may be another alternative and she has appointment tomorrow with them and will review what their thoughts are. She also wanted to know if she should continue the furosemide because she was told it would not help with her leg swelling. Advised that she should continue this for her heart. She was upset her appointment was canceled and she wanted Dr. Rockey Situ to know that her swelling is bad and now she is not able to wear compression socks due to her wounds. Recommended that she try to keep them elevated as much as possible and hopefully wound care will be able to give her more help with the wounds and swelling. She verbalized understanding and is scheduled for May to return for visit and let her know that I would send message to Dr. Rockey Situ. She was appreciative for taking the time to talk with her and let her know that we would see her soon.

## 2019-07-03 ENCOUNTER — Encounter: Payer: Self-pay | Admitting: Podiatry

## 2019-07-03 ENCOUNTER — Ambulatory Visit: Payer: Medicare HMO | Admitting: Podiatry

## 2019-07-03 ENCOUNTER — Other Ambulatory Visit: Payer: Self-pay

## 2019-07-03 VITALS — Temp 97.5°F

## 2019-07-03 DIAGNOSIS — D689 Coagulation defect, unspecified: Secondary | ICD-10-CM | POA: Diagnosis not present

## 2019-07-03 DIAGNOSIS — E1149 Type 2 diabetes mellitus with other diabetic neurological complication: Secondary | ICD-10-CM | POA: Diagnosis not present

## 2019-07-03 DIAGNOSIS — L97511 Non-pressure chronic ulcer of other part of right foot limited to breakdown of skin: Secondary | ICD-10-CM | POA: Diagnosis not present

## 2019-07-03 DIAGNOSIS — E1140 Type 2 diabetes mellitus with diabetic neuropathy, unspecified: Secondary | ICD-10-CM | POA: Diagnosis not present

## 2019-07-03 NOTE — Progress Notes (Addendum)
This patient returns to the office with a diagnosis of a diabetic ulcer on the tip of her right hallux.  She was seen 1 week ago and told that she needs to soak her foot and bandaged her foot.  She also needs to wear a surgical shoe in an effort to relieve pressure on the tip of her right great toe.  She says she feels her toe is doing much better and that there is no pain no redness and no drainage coming from the ulcer.  Examination does reveal no pain redness or drainage from the ulcer on the tip of the right big toe.  She presents the office today wearing a bandage that she has applied to her right great toe.  She presents the office today for continued evaluation and treatment of her diabetic ulcer.  Patient is taking Xarelto.  General Appearance  Alert, conversant and in no acute stress.  Vascular  Dorsalis pedis and posterior tibial  pulses are weakly  palpable  bilaterally.  Capillary return is within normal limits  bilaterally. Temperature is within normal limits  Bilaterally.Significant venous insufficiency and lymphedema.  Neurologic  Senn-Weinstein monofilament wire test within normal limits  bilaterally. Muscle power within normal limits bilaterally.  Nails Thick disfigured discolored nails with subungual debris  from hallux to fifth toes bilaterally. No evidence of bacterial infection or drainage bilaterally.  Orthopedic  No limitations of motion  feet .  No crepitus or effusions noted.  No bony pathology or digital deformities noted.  Skin  normotropic skin with no porokeratosis noted bilaterally.  No signs of infections or ulcers noted.  The diabetic ulcer measures 12 mm by 10 mm.  There is red clean granulation tissue noted at the distal tip right hallux.    Diabetic ulcer right hallux.   Return office visit.  Neosporin dry sterile dressing was applied to the diabetic ulcer.  Prior to applying the dressing Dr. Posey Pronto was contacted and consulted with [place] and believes a graft would be  very beneficial in closing this ulcer.  Therefore he recommended she be put onto his schedule next week for him to follow-up with grafting on the right hallux.  Patient was told to continue soaks and bandaging and wearing the surgical shoe until she is reevaluated by Dr. Posey Pronto.  Read noted from yesterday and I believe she should consider  a lymphedema compression pump.   Gardiner Barefoot DPM

## 2019-07-03 NOTE — Telephone Encounter (Signed)
Sounds like she has had complications from wearing compression hose If compression hose not good, and she is probably not going to be able to put Ace wraps on in an effective manner in a regular fashion, Jodi Amsler, do think she might be a candidate for the lymphedema compression pumps? Thx everyone [employer]

## 2019-07-04 ENCOUNTER — Other Ambulatory Visit: Payer: Self-pay

## 2019-07-04 NOTE — Telephone Encounter (Signed)
Call to patient to make her aware of recommendations from Dr. Rockey Situ.   Pt verbalized understanding and will use ace bandages while she is waiting on vascular appt.   Advised pt to call for any further questions or concerns.

## 2019-07-07 ENCOUNTER — Telehealth: Payer: Self-pay

## 2019-07-08 ENCOUNTER — Ambulatory Visit: Payer: Medicare HMO | Admitting: Internal Medicine

## 2019-07-08 NOTE — Telephone Encounter (Signed)
The pumps would certainly help long term but most people with open wounds find it painful  What about Unna boots?

## 2019-07-09 NOTE — Telephone Encounter (Signed)
I like the idea of unna boots for her but transportation is always a problem   MTS

## 2019-07-10 ENCOUNTER — Ambulatory Visit: Payer: Medicare HMO | Admitting: Podiatry

## 2019-07-11 NOTE — Telephone Encounter (Signed)
Has anyone had luck with visiting nurse to the home for unna boots? If not available, perhaps the pumps? Thx to all

## 2019-07-15 ENCOUNTER — Other Ambulatory Visit: Payer: Self-pay

## 2019-07-15 ENCOUNTER — Ambulatory Visit: Payer: Medicare HMO | Admitting: Podiatry

## 2019-07-15 DIAGNOSIS — E1149 Type 2 diabetes mellitus with other diabetic neurological complication: Secondary | ICD-10-CM | POA: Diagnosis not present

## 2019-07-15 DIAGNOSIS — L97512 Non-pressure chronic ulcer of other part of right foot with fat layer exposed: Secondary | ICD-10-CM | POA: Diagnosis not present

## 2019-07-15 DIAGNOSIS — E1140 Type 2 diabetes mellitus with diabetic neuropathy, unspecified: Secondary | ICD-10-CM | POA: Diagnosis not present

## 2019-07-15 MED ORDER — SANTYL 250 UNIT/GM EX OINT: 1.0000 "application " | TOPICAL_OINTMENT | Freq: Every day | CUTANEOUS | 0 refills | Status: DC

## 2019-07-16 NOTE — Telephone Encounter (Signed)
I think home health does do unna boots the patient has to follow up in an office once a month

## 2019-07-17 ENCOUNTER — Encounter: Payer: Self-pay | Admitting: Podiatry

## 2019-07-17 NOTE — Progress Notes (Signed)
Subjective:  Patient ID: Jodi Reeves, female    DOB: 05-12-1927,  MRN: [id number]  Chief Complaint  Patient presents with  . Wound Check    pt is here for a f/u on the skin graft of the left big toe, as well as the left second toe. pt is having a hard time applying bandages, as well as using a surgical shoe as well.    86 y.o. female presents for wound care.  Patient presents with right hallux distal tip ulceration.  Patient states that this has been going on for quite some time.  Patient has had hard time walking on it due to the pain associated with it.  Patient may be concerned that there might be a same ulceration forming on the left side however there is no ulceration currently.  Patient has hard time applying the surgical shoe.  I educated her the importance of surgical shoe.  She was aggressively treated by Dr. Prudence Davidson for past couple of visits.  She was also denied for a skin graft by her insurance company.   Review of Systems: Negative except as noted in the HPI. Denies N/V/F/Ch.  Past Medical History:  Diagnosis Date  . Allergy   . Anxiety   . Balance disorder   . Chronic diastolic CHF (congestive heart failure) ([place])   . Colon polyps   . Coronary artery disease, non-occlusive    a. LHC 11/2003: 10% LAD stenosis, 20% LCx stenosis, and 40% RCA stenosis; b. nuc stress test 12/15: small region of mild ischemia in the apical region with WMA also noted in the apical region, EF 60%. She declined invasive evaluation at that time  . DM2 (diabetes mellitus, type 2) ([place])   . Fall    04/2017 [facility] multiple fractures, brusing   . Falls frequently    h/o left shoudler/wrist fracture and left fingers numb  . GERD (gastroesophageal reflux disease)    esophageal web  . 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 History of chicken pox   . History of eating disorder   . HLD (hyperlipidemia)   . HTN (hypertension)   . Hypothyroidism   . IBS (irritable bowel  syndrome)    diarrhea   . Incontinence of bowel   . Osteoporosis   . Ovarian cancer ([place])   . Persistent atrial fibrillation ([place])    a. CHADS2VASc = > 8 (CHF, HTN, age x 2, DM, TIA x 2, female); b. on Xarelto  . SBO (small bowel obstruction) ([place])    1998  . TIA (transient ischemic attack)   . UTI (urinary tract infection)   . Venous insufficiency     Current Outpatient Medications:  .  acetaminophen (TYLENOL) 500 MG tablet, Take 1,000 mg by mouth every 6 (six) hours as needed for moderate pain or headache., Disp: , Rfl:  .  Ascorbic Acid (VITAMIN C) POWD, Take 200 mg by mouth daily. , Disp: , Rfl:  .  bisoprolol (ZEBETA) 5 MG tablet, Take 1 tablet (5 mg total) by mouth daily., Disp: 90 tablet, Rfl: 3 .  calcium carbonate (TUMS - DOSED IN MG ELEMENTAL CALCIUM) 500 MG chewable tablet, Chew 1 tablet by mouth daily., Disp: , Rfl:  .  co-enzyme Q-10 30 MG capsule, Take 30 mg by mouth daily. , Disp: , Rfl:  .  doxycycline (VIBRAMYCIN) 100 MG capsule, Take 100 mg by mouth 2 (two) times daily., Disp: , Rfl:  .  estradiol (ESTRACE) 0.1  MG/GM vaginal cream, APPLY 0.5MG  (PEA SIZED AMOUNT) JUST INSIDE THE VAGINA WITH FINGER-TIP ON MONDAY, WEDNESDAY AND FRIDAY NIGHTS., Disp: 42.5 g, Rfl: 0 .  furosemide (LASIX) 20 MG tablet, Take 1 tablet (20 mg total) by mouth 2 (two) times daily., Disp: 190 tablet, Rfl: 3 .  glucose blood (ONE TOUCH ULTRA TEST) test strip, Use to test blood sugar once daily E11.49 (Patient taking differently: Use to test blood sugar twice daily E11.49), Disp: 100 each, Rfl: 1 .  levothyroxine (SYNTHROID, LEVOTHROID) 50 MCG tablet, Take 50 mcg by mouth daily before breakfast., Disp: , Rfl:  .  linagliptin (TRADJENTA) 5 MG TABS tablet, Take by mouth., Disp: , Rfl:  .  losartan (COZAAR) 25 MG tablet, Take 0.5 tablets (12.5 mg total) by mouth daily., Disp: 45 tablet, Rfl: 0 .  metFORMIN (GLUCOPHAGE-XR) 500 MG 24 hr tablet, Take 1,000 mg by mouth 2 (two) times daily., Disp: , Rfl:  .   Multiple Vitamin (CALCIUM COMPLEX PO), Take 1 tablet by mouth daily., Disp: , Rfl:  .  NON FORMULARY, Trivita Supplement B12 Takes qd. B6 qd, and folic acid, Disp: , Rfl:  .  Omega-3 1000 MG CAPS, Take 2,000 mg by mouth daily. , Disp: , Rfl:  .  potassium chloride (KLOR-CON) 10 MEQ tablet, Take 1 tablet (10 mEq total) by mouth 2 (two) times daily., Disp: 180 tablet, Rfl: 3 .  psyllium (METAMUCIL) 58.6 % packet, Take 1 packet by mouth 2 (two) times daily. , Disp: , Rfl:  .  rivaroxaban (XARELTO) 20 MG TABS tablet, Take 1 tablet (20 mg total) by mouth daily with supper., Disp: 30 tablet, Rfl: 6 .  collagenase (SANTYL) ointment, Apply 1 application topically daily., Disp: 15 g, Rfl: 0  Social History   Tobacco Use  Smoking Status Never Smoker  Smokeless Tobacco Never Used    Allergies  Allergen Reactions  . Amiodarone Other (See Comments)    Severe Thyroid issues   . Penicillins Anaphylaxis and Other (See Comments)    Has patient had a PCN reaction causing immediate rash, facial/tongue/throat swelling, SOB or lightheadedness with hypotension: Yes Has patient had a PCN reaction causing severe rash involving mucus membranes or skin necrosis: No Has patient had a PCN reaction that required hospitalization: No Has patient had a PCN reaction occurring within the last 10 years: No If all of the above answers are "NO", then may proceed with Cephalosporin use.   . Actos [Pioglitazone]     Not effective   . Amaryl [Glimepiride]     Not effective    . Atorvastatin Other (See Comments)    Muscle aches & All statins per Patient  . Ciprofloxacin Other (See Comments)    High blood pressure    . Codeine Nausea And Vomiting  . Ezetimibe-Simvastatin Other (See Comments)    Muscle aches, nausea, back pain   . Fosamax [Alendronate]     dysphagia  . Lovastatin Other (See Comments)    Myalgias  . Macrobid [Nitrofurantoin Macrocrystal]     Severe Itching, rash   . Protonix [Pantoprazole Sodium]      Esophageal problems    . Rosiglitazone Maleate Other (See Comments)    Edema  . Statins     Muscle and joint aches to all statins  . Victoza [Liraglutide]     Nausea   . Alendronate Sodium Rash    Dysphagia and ulceration   . Bactrim [Sulfamethoxazole-Trimethoprim] Rash    itching  . Sulfa Antibiotics Rash  Itching    Objective:  There were no vitals filed for this visit. There is no height or weight on file to calculate BMI. Constitutional Well developed. Well nourished.  Vascular Dorsalis pedis pulses palpable bilaterally. Posterior tibial pulses palpable bilaterally. Capillary refill normal to all digits.  No cyanosis or clubbing noted. Pedal hair growth normal.  Neurologic Normal speech. Oriented to person, place, and time. Protective sensation absent  Dermatologic Wound Location: Right hallux ulceration Wound Base: Granular/Healthy Peri-wound: Hyperkeratotic Exudate: Scant/small amount Serous exudate Wound Measurements: -See below  Orthopedic: No pain to palpation either foot.   Radiographs: None Assessment:   1. Chronic ulcer of right great toe with fat layer exposed ([place])   2. Diabetic neuropathy with neurologic complication [facility])    Plan:  Patient was evaluated and treated and all questions answered.  Ulcer right hallux distal tip ulceration -Debridement as below. -Dressed with Santyl wet-to-dry, DSD. -Continue off-loading with surgical shoe. -Unfortunately she was denied by the insurance company for the graft application of tides medical graft which would not help heal this ulcer in a fashionable time however given that patient still has an open ulceration we will plan on doing Santyl wet-to-dry dressing for now.  Procedure: Excisional Debridement of Wound Rationale: Removal of non-viable soft tissue from the wound to promote healing.  Anesthesia: none Pre-Debridement Wound Measurements: 1.2 cm x 1 cm x 0.3 cm  Post-Debridement Wound  Measurements: Same Type of Debridement: Sharp Excisional Tissue Removed: Non-viable soft tissue Depth of Debridement: subcutaneous tissue. Technique: Sharp excisional debridement to bleeding, viable wound base.  Dressing: Dry, sterile, compression dressing. Disposition: Patient tolerated procedure well. Patient to return in 1 week for follow-up.  No follow-ups on file.

## 2019-07-20 NOTE — Telephone Encounter (Signed)
I can help with the Unna boots if needed Pam, have you ordered these before? would presumably need home nursing to place the Unna boots with periodic checking and follow-up in our office once a month for evaluation.  I would need some guidance on wound care Marya Amsler /Tracy let me know if you want me to run with it or if you want to do the order and see her in clinic . either way is okay with me. Thx tg

## 2019-07-21 NOTE — Telephone Encounter (Signed)
Reached out to [facility] regarding this patient to seek which agency can do the una boots. Markham Jordan in their office was very helpful and stated that she has appointment on 08/07/2019 for LE Venous & Follow up with Dr. Delana Meyer. She states that at that time they can arrange for her to get the unna boots placed. Will CC all providers listed with this update and requested Markham Jordan to give me a call if I can assist any further.

## 2019-07-21 NOTE — Telephone Encounter (Signed)
Dr. Rockey Situ  If H/H nursing can do under vascular or your instruction on care I think this is a great idea  Take care [place]

## 2019-07-21 NOTE — Telephone Encounter (Addendum)
Spoke with patient and reviewed the appointments coming up with venous ultrasound and then appointment with provider at the [facility]. Spoke with someone in their office and they said that they can arrange this at her upcoming appointment. Reviewed all of this information with patient, appointments with date & time. She was appreciative for the call back with plan of care and to confirm appointments. She had no further questions at this time.

## 2019-07-22 ENCOUNTER — Ambulatory Visit: Payer: Medicare HMO | Admitting: Podiatry

## 2019-07-31 ENCOUNTER — Ambulatory Visit: Payer: Medicare HMO | Admitting: Podiatry

## 2019-07-31 ENCOUNTER — Other Ambulatory Visit: Payer: Self-pay

## 2019-07-31 ENCOUNTER — Encounter: Payer: Self-pay | Admitting: Podiatry

## 2019-07-31 VITALS — Temp 97.1°F

## 2019-07-31 DIAGNOSIS — E1140 Type 2 diabetes mellitus with diabetic neuropathy, unspecified: Secondary | ICD-10-CM

## 2019-07-31 DIAGNOSIS — M2042 Other hammer toe(s) (acquired), left foot: Secondary | ICD-10-CM | POA: Diagnosis not present

## 2019-07-31 DIAGNOSIS — L97512 Non-pressure chronic ulcer of other part of right foot with fat layer exposed: Secondary | ICD-10-CM | POA: Diagnosis not present

## 2019-07-31 DIAGNOSIS — E1149 Type 2 diabetes mellitus with other diabetic neurological complication: Secondary | ICD-10-CM | POA: Diagnosis not present

## 2019-08-01 ENCOUNTER — Encounter: Payer: Self-pay | Admitting: Podiatry

## 2019-08-01 NOTE — Progress Notes (Signed)
Subjective:  Patient ID: Jodi Reeves, female    DOB: 19-May-1927,  MRN: [id number]  Chief Complaint  Patient presents with  . Foot Ulcer    "I think its healing up"    86 y.o. female presents for wound care.  Patient presents with right hallux distal tip ulceration.  Patient states that she is doing well overall.  She has been applying Santyl wet-to-dry dressing change it has had made the ulcer go down considerably.  She has secondary complaint of left second digit hammertoe.  She just wanted to know what her treatment options were if there is anything that could be done for it.  She states it does hurt on top of the foot especially when rubbing against the shoe from the hammered nature of the second toe.  She denies any other acute complaints.  Review of Systems: Negative except as noted in the HPI. Denies N/V/F/Ch.  Past Medical History:  Diagnosis Date  . Allergy   . Anxiety   . Balance disorder   . Chronic diastolic CHF (congestive heart failure) ([place])   . Colon polyps   . Coronary artery disease, non-occlusive    a. LHC 11/2003: 10% LAD stenosis, 20% LCx stenosis, and 40% RCA stenosis; b. nuc stress test 12/15: small region of mild ischemia in the apical region with WMA also noted in the apical region, EF 60%. She declined invasive evaluation at that time  . DM2 (diabetes mellitus, type 2) ([place])   . Fall    04/2017 [facility] multiple fractures, brusing   . Falls frequently    h/o left shoudler/wrist fracture and left fingers numb  . GERD (gastroesophageal reflux disease)    esophageal web  . 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 History of chicken pox   . History of eating disorder   . HLD (hyperlipidemia)   . HTN (hypertension)   . Hypothyroidism   . IBS (irritable bowel syndrome)    diarrhea   . Incontinence of bowel   . Osteoporosis   . Ovarian cancer ([place])   . Persistent atrial fibrillation ([place])    a. CHADS2VASc = > 8 (CHF, HTN, age x 2,  DM, TIA x 2, female); b. on Xarelto  . SBO (small bowel obstruction) ([place])    1998  . TIA (transient ischemic attack)   . UTI (urinary tract infection)   . Venous insufficiency     Current Outpatient Medications:  .  acetaminophen (TYLENOL) 500 MG tablet, Take 1,000 mg by mouth every 6 (six) hours as needed for moderate pain or headache., Disp: , Rfl:  .  Ascorbic Acid (VITAMIN C) POWD, Take 200 mg by mouth daily. , Disp: , Rfl:  .  bisoprolol (ZEBETA) 5 MG tablet, Take 1 tablet (5 mg total) by mouth daily., Disp: 90 tablet, Rfl: 3 .  calcium carbonate (TUMS - DOSED IN MG ELEMENTAL CALCIUM) 500 MG chewable tablet, Chew 1 tablet by mouth daily., Disp: , Rfl:  .  co-enzyme Q-10 30 MG capsule, Take 30 mg by mouth daily. , Disp: , Rfl:  .  collagenase (SANTYL) ointment, Apply 1 application topically daily., Disp: 15 g, Rfl: 0 .  doxycycline (VIBRAMYCIN) 100 MG capsule, Take 100 mg by mouth 2 (two) times daily., Disp: , Rfl:  .  estradiol (ESTRACE) 0.1 MG/GM vaginal cream, APPLY 0.5MG  (PEA SIZED AMOUNT) JUST INSIDE THE VAGINA WITH FINGER-TIP ON MONDAY, WEDNESDAY AND FRIDAY NIGHTS., Disp: 42.5 g, Rfl: 0 .  furosemide (LASIX) 20 MG tablet, Take 1 tablet (20 mg total) by mouth 2 (two) times daily., Disp: 190 tablet, Rfl: 3 .  glucose blood (ONE TOUCH ULTRA TEST) test strip, Use to test blood sugar once daily E11.49 (Patient taking differently: Use to test blood sugar twice daily E11.49), Disp: 100 each, Rfl: 1 .  levothyroxine (SYNTHROID, LEVOTHROID) 50 MCG tablet, Take 50 mcg by mouth daily before breakfast., Disp: , Rfl:  .  linagliptin (TRADJENTA) 5 MG TABS tablet, Take by mouth., Disp: , Rfl:  .  losartan (COZAAR) 25 MG tablet, Take 0.5 tablets (12.5 mg total) by mouth daily., Disp: 45 tablet, Rfl: 0 .  metFORMIN (GLUCOPHAGE-XR) 500 MG 24 hr tablet, Take 1,000 mg by mouth 2 (two) times daily., Disp: , Rfl:  .  Multiple Vitamin (CALCIUM COMPLEX PO), Take 1 tablet by mouth daily., Disp: , Rfl:  .   NON FORMULARY, Trivita Supplement B12 Takes qd. B6 qd, and folic acid, Disp: , Rfl:  .  Omega-3 1000 MG CAPS, Take 2,000 mg by mouth daily. , Disp: , Rfl:  .  potassium chloride (KLOR-CON) 10 MEQ tablet, Take 1 tablet (10 mEq total) by mouth 2 (two) times daily., Disp: 180 tablet, Rfl: 3 .  psyllium (METAMUCIL) 58.6 % packet, Take 1 packet by mouth 2 (two) times daily. , Disp: , Rfl:  .  rivaroxaban (XARELTO) 20 MG TABS tablet, Take 1 tablet (20 mg total) by mouth daily with supper., Disp: 30 tablet, Rfl: 6  Social History   Tobacco Use  Smoking Status Never Smoker  Smokeless Tobacco Never Used    Allergies  Allergen Reactions  . Amiodarone Other (See Comments)    Severe Thyroid issues   . Penicillins Anaphylaxis and Other (See Comments)    Has patient had a PCN reaction causing immediate rash, facial/tongue/throat swelling, SOB or lightheadedness with hypotension: Yes Has patient had a PCN reaction causing severe rash involving mucus membranes or skin necrosis: No Has patient had a PCN reaction that required hospitalization: No Has patient had a PCN reaction occurring within the last 10 years: No If all of the above answers are "NO", then may proceed with Cephalosporin use.   . Actos [Pioglitazone]     Not effective   . Amaryl [Glimepiride]     Not effective    . Atorvastatin Other (See Comments)    Muscle aches & All statins per Patient  . Ciprofloxacin Other (See Comments)    High blood pressure    . Codeine Nausea And Vomiting  . Ezetimibe-Simvastatin Other (See Comments)    Muscle aches, nausea, back pain   . Fosamax [Alendronate]     dysphagia  . Lovastatin Other (See Comments)    Myalgias  . Macrobid [Nitrofurantoin Macrocrystal]     Severe Itching, rash   . Protonix [Pantoprazole Sodium]     Esophageal problems    . Rosiglitazone Maleate Other (See Comments)    Edema  . Statins     Muscle and joint aches to all statins  . Victoza [Liraglutide]     Nausea     . Alendronate Sodium Rash    Dysphagia and ulceration   . Bactrim [Sulfamethoxazole-Trimethoprim] Rash    itching  . Sulfa Antibiotics Rash    Itching    Objective:   Vitals:   07/31/19 1411  Temp: (!) 97.1 F (36.2 C)   There is no height or weight on file to calculate BMI. Constitutional Well developed. Well nourished.  Vascular  Dorsalis pedis pulses palpable bilaterally. Posterior tibial pulses palpable bilaterally. Capillary refill normal to all digits.  No cyanosis or clubbing noted. Pedal hair growth normal.  Neurologic Normal speech. Oriented to person, place, and time. Protective sensation absent  Dermatologic Wound Location: Right hallux ulceration Wound Base: Granular/Healthy Peri-wound: Hyperkeratotic Exudate: Scant/small amount Serous exudate Wound Measurements: -See below  Orthopedic:  Left second digit hammertoe contracture semiflexible in nature.  Mild pain on palpation.   Radiographs: None Assessment:   1. Diabetic neuropathy with neurologic complication ([place])   2. Chronic ulcer of right great toe with fat layer exposed ([place])   3. Hammertoe of second toe of left foot    Plan:  Patient was evaluated and treated and all questions answered.  Ulcer right hallux distal tip ulceration~decreasing -Debridement as below. -Dressed with Santyl wet-to-dry, DSD. -Continue off-loading with surgical shoe. -Unfortunately she was denied by the insurance company for the graft application of tides medical graft which would not help heal this ulcer in a fashionable time however given that patient still has an open ulceration we will plan on doing Santyl wet-to-dry dressing for now.  Left second digit hammertoe contracture -I explained to the patient the etiology of hammertoe contracture and various treatment options were extensively discussed with the patient.  At this time given her pain is mild to moderate in nature I will hold off on any kind of surgical  intervention.  I believe she will benefit from offloading a wider shoe gear modification.  I discussed with the patient to obtain shoe gear that is wider in the toebox region.  Patient states understanding and will do so  Procedure: Excisional Debridement of Wound Rationale: Removal of non-viable soft tissue from the wound to promote healing.  Anesthesia: none Pre-Debridement Wound Measurements: 1 cm x 0.9 cm x 0.2 cm Post-Debridement Wound Measurements: Same Type of Debridement: Sharp Excisional Tissue Removed: Non-viable soft tissue Depth of Debridement: subcutaneous tissue. Technique: Sharp excisional debridement to bleeding, viable wound base.  Dressing: Dry, sterile, compression dressing. Disposition: Patient tolerated procedure well. Patient to return in 1 week for follow-up.  No follow-ups on file.

## 2019-08-05 ENCOUNTER — Other Ambulatory Visit (INDEPENDENT_AMBULATORY_CARE_PROVIDER_SITE_OTHER): Payer: Self-pay | Admitting: Vascular Surgery

## 2019-08-05 ENCOUNTER — Other Ambulatory Visit (INDEPENDENT_AMBULATORY_CARE_PROVIDER_SITE_OTHER): Payer: Self-pay | Admitting: Physician Assistant

## 2019-08-05 DIAGNOSIS — I890 Lymphedema, not elsewhere classified: Secondary | ICD-10-CM

## 2019-08-05 DIAGNOSIS — I872 Venous insufficiency (chronic) (peripheral): Secondary | ICD-10-CM

## 2019-08-07 ENCOUNTER — Ambulatory Visit (INDEPENDENT_AMBULATORY_CARE_PROVIDER_SITE_OTHER): Payer: Medicare HMO | Admitting: Vascular Surgery

## 2019-08-07 ENCOUNTER — Other Ambulatory Visit: Payer: Self-pay

## 2019-08-07 ENCOUNTER — Ambulatory Visit: Payer: Medicare HMO | Admitting: Podiatry

## 2019-08-07 ENCOUNTER — Ambulatory Visit (INDEPENDENT_AMBULATORY_CARE_PROVIDER_SITE_OTHER): Payer: Medicare HMO

## 2019-08-07 ENCOUNTER — Encounter (INDEPENDENT_AMBULATORY_CARE_PROVIDER_SITE_OTHER): Payer: Self-pay | Admitting: Vascular Surgery

## 2019-08-07 VITALS — BP 144/84 | HR 89 | Ht 63.0 in | Wt 170.0 lb

## 2019-08-07 DIAGNOSIS — I890 Lymphedema, not elsewhere classified: Secondary | ICD-10-CM

## 2019-08-07 DIAGNOSIS — E1149 Type 2 diabetes mellitus with other diabetic neurological complication: Secondary | ICD-10-CM

## 2019-08-07 DIAGNOSIS — L97919 Non-pressure chronic ulcer of unspecified part of right lower leg with unspecified severity: Secondary | ICD-10-CM

## 2019-08-07 DIAGNOSIS — L97929 Non-pressure chronic ulcer of unspecified part of left lower leg with unspecified severity: Secondary | ICD-10-CM

## 2019-08-07 DIAGNOSIS — I83029 Varicose veins of left lower extremity with ulcer of unspecified site: Secondary | ICD-10-CM

## 2019-08-07 DIAGNOSIS — I739 Peripheral vascular disease, unspecified: Secondary | ICD-10-CM

## 2019-08-07 DIAGNOSIS — I872 Venous insufficiency (chronic) (peripheral): Secondary | ICD-10-CM | POA: Diagnosis not present

## 2019-08-07 DIAGNOSIS — I83019 Varicose veins of right lower extremity with ulcer of unspecified site: Secondary | ICD-10-CM | POA: Diagnosis not present

## 2019-08-07 DIAGNOSIS — I10 Essential (primary) hypertension: Secondary | ICD-10-CM

## 2019-08-07 DIAGNOSIS — I4819 Other persistent atrial fibrillation: Secondary | ICD-10-CM

## 2019-08-07 NOTE — Progress Notes (Signed)
MRN : [id number]  Jodi Reeves is a 86 y.o. (09/22/1927) female who presents with chief complaint of  Chief Complaint  Patient presents with  . Follow-up    2 mo bil Le venous Duplex 3/29  [facility] office called to remind to addres unna boot  .  History of Present Illness: Patient is seen for follow up evaluation of leg pain and swelling associated with new ulceration. The patient was recently seen here and has been treated with Louretta Parma boot therapy.  The swelling abruptly became much worse bilaterally and is associated with pain and discoloration. The pain and swelling worsens with prolonged dependency and improves with elevation.  The patient notes that in the morning the legs are better but the leg symptoms worsened throughout the course of the day. The patient has also noted a progressive worsening of the discoloration in the ankle and shin area.   The patient notes that an ulcer has developed acutely without specific trauma and since it occurred it has been very slow to heal.  There is a moderate amount of drainage associated with the open area.  The wound is also very painful.  The patient notes that they were not able to tolerate the Unna boot and removed it several days ago.  The patient states that they have been elevating as much as possible. The patient denies any recent changes in medications.  The patient denies a history of DVT or PE. There is no prior history of phlebitis. There is no history of primary lymphedema.  No SOB or increased cough.  No sputum production.  No recent episodes of CHF exacerbation.   Current Meds  Medication Sig  . acetaminophen (TYLENOL) 500 MG tablet Take 1,000 mg by mouth every 6 (six) hours as needed for moderate pain or headache.  . Ascorbic Acid (VITAMIN C) POWD Take 200 mg by mouth daily.   . bisoprolol (ZEBETA) 5 MG tablet Take 1 tablet (5 mg total) by mouth daily.  . calcium carbonate (TUMS - DOSED IN MG ELEMENTAL CALCIUM) 500 MG chewable  tablet Chew 1 tablet by mouth daily.  Marland Kitchen co-enzyme Q-10 30 MG capsule Take 30 mg by mouth daily.   . collagenase (SANTYL) ointment Apply 1 application topically daily.  Marland Kitchen estradiol (ESTRACE) 0.1 MG/GM vaginal cream APPLY 0.5MG  (PEA SIZED AMOUNT) JUST INSIDE THE VAGINA WITH FINGER-TIP ON MONDAY, WEDNESDAY AND FRIDAY NIGHTS.  . furosemide (LASIX) 20 MG tablet Take 1 tablet (20 mg total) by mouth 2 (two) times daily.  Marland Kitchen glucose blood (ONE TOUCH ULTRA TEST) test strip Use to test blood sugar once daily E11.49 (Patient taking differently: Use to test blood sugar twice daily E11.49)  . levothyroxine (SYNTHROID, LEVOTHROID) 50 MCG tablet Take 50 mcg by mouth daily before breakfast.  . linagliptin (TRADJENTA) 5 MG TABS tablet Take by mouth.  . losartan (COZAAR) 25 MG tablet Take 0.5 tablets (12.5 mg total) by mouth daily.  . metFORMIN (GLUCOPHAGE-XR) 500 MG 24 hr tablet Take 1,000 mg by mouth 2 (two) times daily.  . Multiple Vitamin (CALCIUM COMPLEX PO) Take 1 tablet by mouth daily.  . NON FORMULARY Trivita Supplement B12 Takes qd. B6 qd, and folic acid  . Omega-3 1000 MG CAPS Take 2,000 mg by mouth daily.   . potassium chloride (KLOR-CON) 10 MEQ tablet Take 1 tablet (10 mEq total) by mouth 2 (two) times daily.  . psyllium (METAMUCIL) 58.6 % packet Take 1 packet by mouth 2 (two) times daily.   Marland Kitchen  rivaroxaban (XARELTO) 20 MG TABS tablet Take 1 tablet (20 mg total) by mouth daily with supper.    Past Medical History:  Diagnosis Date  . Allergy   . Anxiety   . Balance disorder   . Chronic diastolic CHF (congestive heart failure) ([place])   . Colon polyps   . Coronary artery disease, non-occlusive    a. LHC 11/2003: 10% LAD stenosis, 20% LCx stenosis, and 40% RCA stenosis; b. nuc stress test 12/15: small region of mild ischemia in the apical region with WMA also noted in the apical region, EF 60%. She declined invasive evaluation at that time  . DM2 (diabetes mellitus, type 2) ([place])   . Fall    04/2017 see  [facility]where [facility] multiple fractures, brusing   . Falls frequently    h/o left shoudler/wrist fracture and left fingers numb  . GERD (gastroesophageal reflux disease)    esophageal web  . 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 History of chicken pox   . History of eating disorder   . HLD (hyperlipidemia)   . HTN (hypertension)   . Hypothyroidism   . IBS (irritable bowel syndrome)    diarrhea   . Incontinence of bowel   . Osteoporosis   . Ovarian cancer ([place])   . Persistent atrial fibrillation ([place])    a. CHADS2VASc = > 8 (CHF, HTN, age x 2, DM, TIA x 2, female); b. on Xarelto  . SBO (small bowel obstruction) ([place])    1998  . TIA (transient ischemic attack)   . UTI (urinary tract infection)   . Venous insufficiency     Past Surgical History:  Procedure Laterality Date  . 2nd look laparotomy  1982  . ABDOMINAL HYSTERECTOMY     for ovarian cancer s/p hysterectomy total 1976 and exp lap 1980  . APPENDECTOMY    . CARDIAC CATHETERIZATION  8/05   neg; a fib found   . CARPAL TUNNEL RELEASE Left 03/06/2018   Procedure: CARPAL TUNNEL RELEASE ENDOSCOPIC;  Surgeon: Corky Mull, MD;  Location: [facility];  Service: Orthopedics;  Laterality: Left;  diabetic - oral meds  . CARPAL TUNNEL RELEASE     left CTS Dr.Poggi  . cataract OD  2002  . cataract surgery  5/07   R  . CHOLECYSTECTOMY  1986  . DEXA  9/04 and 1/02  . EGD/dilation/colon  1/06  . ESOPHAGOGASTRODUODENOSCOPY (EGD) WITH PROPOFOL N/A 05/16/2016   Procedure: ESOPHAGOGASTRODUODENOSCOPY (EGD) WITH PROPOFOL with dilation;  Surgeon: Lucilla Lame, MD;  Location: [facility];  Service: Endoscopy;  Laterality: N/A;  . FEMORAL HERNIA REPAIR  1958   R  . fracture L elbow/wrist  2000  . intussception/obstruction  12/98  . JOINT REPLACEMENT     left shoulder  . kidney stone x2  1991  . laminectomy L4-5  1971  . LUMBAR LAMINECTOMY     1970 ruptured disc   . MOUTH SURGERY    . myoview stress  8/05    syncope (-)  . REVERSE SHOULDER ARTHROPLASTY Left 10/06/2014   Procedure: REVERSE SHOULDER ARTHROPLASTY;  Surgeon: Corky Mull, MD;  Location: [facility];  Service: Orthopedics;  Laterality: Left;  . stillbirth  1969  . TOTAL ABDOMINAL HYSTERECTOMY W/ BILATERAL SALPINGOOPHORECTOMY  1980   ovarian cancer  . WRIST FRACTURE SURGERY  11/05   R    Social History Social History   Tobacco Use  . Smoking status: Never Smoker  . Smokeless tobacco: Never  Used  Substance Use Topics  . Alcohol use: No    Comment: may have occasional glass of wine  . Drug use: No    Family History Family History  Problem Relation Age of Onset  . Early death Father   . Diabetes Mother   . Heart disease Mother   . Arthritis Brother   . Cancer Brother   . Depression Brother   . Hearing loss Brother   . Arthritis Brother   . Cancer Brother        colon  . Hearing loss Brother   . Cancer Brother   . Diabetes Brother   . Hearing loss Brother   . Heart disease Brother   . Hyperlipidemia Brother   . Heart disease Sister   . Hypertension Sister   . Stroke Sister   . Hearing loss Daughter   . Hypertension Daughter   . Diabetes Paternal Grandfather   . Hearing loss Sister   . Heart disease Sister   . Arthritis Sister   . Depression Sister   . Hearing loss Sister   . Heart disease Sister   . COPD Brother   . Early death Brother   . Early death Brother     Allergies  Allergen Reactions  . Amiodarone Other (See Comments)    Severe Thyroid issues   . Penicillins Anaphylaxis and Other (See Comments)    Has patient had a PCN reaction causing immediate rash, facial/tongue/throat swelling, SOB or lightheadedness with hypotension: Yes Has patient had a PCN reaction causing severe rash involving mucus membranes or skin necrosis: No Has patient had a PCN reaction that required hospitalization: No Has patient had a PCN reaction occurring within the last 10 years: No If all of the above answers are "NO",  then may proceed with Cephalosporin use.   . Actos [Pioglitazone]     Not effective   . Amaryl [Glimepiride]     Not effective    . Atorvastatin Other (See Comments)    Muscle aches & All statins per Patient  . Ciprofloxacin Other (See Comments)    High blood pressure    . Codeine Nausea And Vomiting  . Ezetimibe-Simvastatin Other (See Comments)    Muscle aches, nausea, back pain   . Fosamax [Alendronate]     dysphagia  . Lovastatin Other (See Comments)    Myalgias  . Macrobid [Nitrofurantoin Macrocrystal]     Severe Itching, rash   . Protonix [Pantoprazole Sodium]     Esophageal problems    . Rosiglitazone Maleate Other (See Comments)    Edema  . Statins     Muscle and joint aches to all statins  . Victoza [Liraglutide]     Nausea   . Alendronate Sodium Rash    Dysphagia and ulceration   . Bactrim [Sulfamethoxazole-Trimethoprim] Rash    itching  . Sulfa Antibiotics Rash    Itching      REVIEW OF SYSTEMS (Negative unless checked)  Constitutional: [] Weight loss  [] Fever  [] Chills Cardiac: [] Chest pain   [] Chest pressure   [] Palpitations   [] Shortness of breath when laying flat   [] Shortness of breath with exertion. Vascular:  [] Pain in legs with walking   [x] Pain in legs at rest  [] History of DVT   [] Phlebitis   [x] Swelling in legs   [] Varicose veins   [x] Non-healing ulcers Pulmonary:   [] Uses home oxygen   [] Productive cough   [] Hemoptysis   [] Wheeze  [] COPD   [] Asthma Neurologic:  [] Dizziness   []   Seizures   [] History of stroke   [] History of TIA  [] Aphasia   [] Vissual changes   [] Weakness or numbness in arm   [] Weakness or numbness in leg Musculoskeletal:   [] Joint swelling   [] Joint pain   [] Low back pain Hematologic:  [] Easy bruising  [] Easy bleeding   [] Hypercoagulable state   [] Anemic Gastrointestinal:  [] Diarrhea   [] Vomiting  [] Gastroesophageal reflux/heartburn   [] Difficulty swallowing. Genitourinary:  [] Chronic kidney disease   [] Difficult urination   [] Frequent urination   [] Blood in urine Skin:  [x] Rashes   [x] Ulcers  Psychological:  [] History of anxiety   []  History of major depression.  Physical Examination  Vitals:   08/07/19 1409  BP: (!) 144/84  Pulse: 89  Weight: 170 lb (77.1 kg)  Height: 5\' 3"  (1.6 m)   Body mass index is 30.11 kg/m. Gen: WD/WN, NAD Head: [place]/AT, No temporalis wasting.  Ear/Nose/Throat: Hearing grossly intact, nares w/o erythema or drainage Eyes: PER, EOMI, sclera nonicteric.  Neck: Supple, no large masses.   Pulmonary:  Good air movement, no audible wheezing bilaterally, no use of accessory muscles.  Cardiac: RRR, no JVD Vascular: 2-3+ edema bilaterally with severe venous changes bilaterally.  Venous ulcers noted in the ankle area bilaterally, noninfected Vessel Right Left  PT Not Palpable Not Palpable  DP Not Palpable Not Palpable  Gastrointestinal: Non-distended. No guarding/no peritoneal signs.  Musculoskeletal: M/S 5/5 throughout.  No deformity or atrophy.  Neurologic: CN 2-12 intact. Symmetrical.  Speech is fluent. Motor exam as listed above. Psychiatric: Judgment intact, Mood & affect appropriate for pt's clinical situation. Dermatologic: Venous stasis dermatitis with ulcers present.  No changes consistent with cellulitis. Lymph : No lichenification or skin changes of chronic lymphedema.  CBC Lab Results  Component Value Date   WBC 6.3 11/18/2018   HGB 13.3 11/18/2018   HCT 39.9 11/18/2018   MCV 100.8 (H) 11/18/2018   PLT 167.0 11/18/2018    BMET    Component Value Date/Time   NA 139 11/18/2018 1026   K 4.1 11/18/2018 1026   CL 102 11/18/2018 1026   CO2 27 11/18/2018 1026   GLUCOSE 156 (H) 11/18/2018 1026   BUN 19 11/18/2018 1026   BUN 13 01/07/2018 1342   CREATININE 0.79 11/18/2018 1026   CALCIUM 9.5 11/18/2018 1026   GFRNONAA 78 01/07/2018 1342   GFRAA 90 01/07/2018 1342   CrCl cannot be calculated (Patient's most recent lab result is older than the maximum 21 days  allowed.).  COAG Lab Results  Component Value Date   INR 1.17 10/06/2014   INR 1.70 10/01/2014   INR 2.0 03/26/2012    Radiology No results found.   Assessment/Plan 1. Venous stasis ulcers of both lower extremities (HCC) No surgery or intervention at this point in time.    I have had a long discussion with the patient regarding venous insufficiency and why it  causes symptoms, specifically venous ulceration . I have discussed with the patient the chronic skin changes that accompany venous insufficiency and the long term sequela such as infection and recurring  ulceration.  Patient will be placed in [employer] which will be changed weekly drainage permitting.  In addition, behavioral modification including several periods of elevation of the lower extremities during the day will be continued. Achieving a position with the ankles at heart level was stressed to the patient  The patient is instructed to begin routine exercise, especially walking on a daily basis  Patient should undergo duplex ultrasound of the  venous system to ensure that DVT or reflux is not present.  Following the review of the ultrasound the patient will follow up in one week to reassess the degree of swelling and the control that Unna therapy is offering.   The patient can be assessed for graduated compression stockings or wraps as well as a Lymph Pump once the ulcers are healed.   2. PAD (peripheral artery disease) (HCC) Recommend:  Patient should undergo ABI's of the lower extremity ASAP because there has been a significant deterioration in the patient's lower extremity symptoms and new ulceration of the right foot.  The patient states they are having increased pain and a marked decrease in the distance that they can walk.  The risks and benefits as well as the alternatives were discussed in detail with the patient.  All questions were answered.  Patient agrees to proceed and understands this could be a prelude to  angiography and intervention.  The patient will follow up with me in the office to review the studies.    - VAS [place] ABI WITH/WO TBI; Future  3. Chronic venous insufficiency No surgery or intervention at this point in time.    I have had a long discussion with the patient regarding venous insufficiency and why it  causes symptoms. I have discussed with the patient the chronic skin changes that accompany venous insufficiency and the long term sequela such as infection and ulceration.  Patient will begin wearing graduated compression stockings class 1 (20-30 mmHg) or compression wraps on a daily basis once the ulcers are healed with Unna boots a prescription was given. The patient will put the stockings on first thing in the morning and removing them in the evening. The patient is instructed specifically not to sleep in the stockings.    In addition, behavioral modification including several periods of elevation of the lower extremities during the day will be continued. I have demonstrated that proper elevation is a position with the ankles at heart level.  The patient is instructed to begin routine exercise, especially walking on a daily basis  4. Lymphedema No surgery or intervention at this point in time.    I have had a long discussion with the patient regarding venous insufficiency and why it  causes symptoms. I have discussed with the patient the chronic skin changes that accompany venous insufficiency and the long term sequela such as infection and ulceration.  Patient will begin wearing graduated compression stockings class 1 (20-30 mmHg) or compression wraps on a daily basis once the ulcers are healed with Unna boots a prescription was given. The patient will put the stockings on first thing in the morning and removing them in the evening. The patient is instructed specifically not to sleep in the stockings.    In addition, behavioral modification including several periods of elevation of  the lower extremities during the day will be continued. I have demonstrated that proper elevation is a position with the ankles at heart level.  The patient is instructed to begin routine exercise, especially walking on a daily basis   5. Essential hypertension Continue antihypertensive medications as already ordered, these medications have been reviewed and there are no changes at this time.   6. Persistent atrial fibrillation (HCC) Continue antiarrhythmia medications as already ordered, these medications have been reviewed and there are no changes at this time.  Continue anticoagulation as ordered by Cardiology Service   7. Type 2 diabetes mellitus with neurological manifestations, controlled ([place]) Continue hypoglycemic  medications as already ordered, these medications have been reviewed and there are no changes at this time.  Hgb A1C to be monitored as already arranged by primary service    Hortencia Pilar, MD  08/07/2019 2:54 PM

## 2019-08-14 ENCOUNTER — Encounter (INDEPENDENT_AMBULATORY_CARE_PROVIDER_SITE_OTHER): Payer: Self-pay | Admitting: Vascular Surgery

## 2019-08-14 ENCOUNTER — Ambulatory Visit: Payer: Medicare HMO | Admitting: Podiatry

## 2019-08-14 ENCOUNTER — Other Ambulatory Visit: Payer: Self-pay | Admitting: Podiatry

## 2019-08-14 ENCOUNTER — Ambulatory Visit (INDEPENDENT_AMBULATORY_CARE_PROVIDER_SITE_OTHER): Payer: Medicare HMO | Admitting: Nurse Practitioner

## 2019-08-14 ENCOUNTER — Other Ambulatory Visit: Payer: Self-pay

## 2019-08-14 ENCOUNTER — Encounter (INDEPENDENT_AMBULATORY_CARE_PROVIDER_SITE_OTHER): Payer: Self-pay

## 2019-08-14 VITALS — BP 171/76 | HR 92 | Resp 16 | Wt 172.0 lb

## 2019-08-14 DIAGNOSIS — L97929 Non-pressure chronic ulcer of unspecified part of left lower leg with unspecified severity: Secondary | ICD-10-CM | POA: Diagnosis not present

## 2019-08-14 DIAGNOSIS — I83029 Varicose veins of left lower extremity with ulcer of unspecified site: Secondary | ICD-10-CM | POA: Diagnosis not present

## 2019-08-14 DIAGNOSIS — L97919 Non-pressure chronic ulcer of unspecified part of right lower leg with unspecified severity: Secondary | ICD-10-CM | POA: Diagnosis not present

## 2019-08-14 DIAGNOSIS — I739 Peripheral vascular disease, unspecified: Secondary | ICD-10-CM | POA: Insufficient documentation

## 2019-08-14 DIAGNOSIS — I83019 Varicose veins of right lower extremity with ulcer of unspecified site: Secondary | ICD-10-CM | POA: Diagnosis not present

## 2019-08-14 NOTE — Progress Notes (Signed)
History of Present Illness  There is no documented history at this time  Assessments & Plan   There are no diagnoses linked to this encounter.    Additional instructions  Subjective:  Patient presents with venous ulcer of the Bilateral lower extremity.    Procedure:  3 layer unna wrap was placed Bilateral lower extremity.   Plan:   Follow up in one week.  

## 2019-08-18 ENCOUNTER — Encounter (INDEPENDENT_AMBULATORY_CARE_PROVIDER_SITE_OTHER): Payer: Self-pay | Admitting: Nurse Practitioner

## 2019-08-19 ENCOUNTER — Other Ambulatory Visit: Payer: Self-pay

## 2019-08-21 ENCOUNTER — Encounter: Payer: Self-pay | Admitting: Internal Medicine

## 2019-08-21 ENCOUNTER — Ambulatory Visit: Payer: Medicare HMO | Admitting: Internal Medicine

## 2019-08-21 ENCOUNTER — Other Ambulatory Visit: Payer: Self-pay

## 2019-08-21 ENCOUNTER — Telehealth: Payer: Self-pay | Admitting: Internal Medicine

## 2019-08-21 ENCOUNTER — Ambulatory Visit (INDEPENDENT_AMBULATORY_CARE_PROVIDER_SITE_OTHER): Payer: Medicare HMO | Admitting: Nurse Practitioner

## 2019-08-21 VITALS — BP 150/83 | HR 74 | Ht 63.0 in | Wt 172.0 lb

## 2019-08-21 VITALS — BP 132/78 | HR 93 | Temp 98.5°F | Ht 63.0 in | Wt 172.0 lb

## 2019-08-21 DIAGNOSIS — E039 Hypothyroidism, unspecified: Secondary | ICD-10-CM | POA: Diagnosis not present

## 2019-08-21 DIAGNOSIS — R269 Unspecified abnormalities of gait and mobility: Secondary | ICD-10-CM

## 2019-08-21 DIAGNOSIS — L97919 Non-pressure chronic ulcer of unspecified part of right lower leg with unspecified severity: Secondary | ICD-10-CM

## 2019-08-21 DIAGNOSIS — R1013 Epigastric pain: Secondary | ICD-10-CM

## 2019-08-21 DIAGNOSIS — L97929 Non-pressure chronic ulcer of unspecified part of left lower leg with unspecified severity: Secondary | ICD-10-CM | POA: Diagnosis not present

## 2019-08-21 DIAGNOSIS — E1149 Type 2 diabetes mellitus with other diabetic neurological complication: Secondary | ICD-10-CM | POA: Diagnosis not present

## 2019-08-21 DIAGNOSIS — L97909 Non-pressure chronic ulcer of unspecified part of unspecified lower leg with unspecified severity: Secondary | ICD-10-CM

## 2019-08-21 DIAGNOSIS — R1310 Dysphagia, unspecified: Secondary | ICD-10-CM | POA: Diagnosis not present

## 2019-08-21 DIAGNOSIS — I83019 Varicose veins of right lower extremity with ulcer of unspecified site: Secondary | ICD-10-CM

## 2019-08-21 DIAGNOSIS — I83009 Varicose veins of unspecified lower extremity with ulcer of unspecified site: Secondary | ICD-10-CM

## 2019-08-21 DIAGNOSIS — I83029 Varicose veins of left lower extremity with ulcer of unspecified site: Secondary | ICD-10-CM | POA: Diagnosis not present

## 2019-08-21 DIAGNOSIS — R194 Change in bowel habit: Secondary | ICD-10-CM

## 2019-08-21 DIAGNOSIS — I10 Essential (primary) hypertension: Secondary | ICD-10-CM

## 2019-08-21 DIAGNOSIS — I890 Lymphedema, not elsewhere classified: Secondary | ICD-10-CM

## 2019-08-21 NOTE — Patient Instructions (Addendum)
Let's try a medicine called Tradjenta to see if it is affordable and if it helps the sugars. Take one pill daily.  If it does a good job on your sugars. Cut your Metformin back to 1 pill twice a day and see if sugars stay ok on the lower dose and see if your stomach is better on the lower dose.  If sugars remain good, you can try going off the Metformin all together   Labs due 09/08/19 fasting or anytime at [place]

## 2019-08-21 NOTE — Telephone Encounter (Signed)
Please get eye exam from [facility]   Thanks [facility]

## 2019-08-21 NOTE — Progress Notes (Signed)
History of Present Illness  There is no documented history at this time  Assessments & Plan   There are no diagnoses linked to this encounter.    Additional instructions  Subjective:  Patient presents with venous ulcer of the Bilateral lower extremity.    Procedure:  3 layer unna wrap was placed Bilateral lower extremity.   Plan:   Follow up in one week.  

## 2019-08-21 NOTE — Progress Notes (Signed)
Chief Complaint  Patient presents with  . Follow-up   F/u  1. Pt co dysphagia,constipation/diarrhea at times and epigastric pain and due to f/u with [name] will CC him  2. DM 2 last A1C 06/10/19 [facility] 7.9 on metformin XR was on 1000 mg bid added Tradjenta 5 mg qd per [facility] endocrine Dr. Ladell Pier per his last note 05/2019 if A1C trending down with Tradjenta was going to reduce metformin to 500 mg bid  Eye exam had [facility] will get records  3. Balance issues at home pt walking with cane today agreeable to PT at home  4. Foot ulcers and leg wounds currently seeing vascular and podiatry Dr. Posey Pronto TFC appt sch next week TFC and foot wounds healing with santyl and Normal saline and unna boots they are trying to get lymphopress approved and insurance declined her to have wound grafts    Review of Systems  Constitutional: Negative for weight loss.  HENT: Negative for hearing loss.   Eyes: Negative for blurred vision.  Respiratory: Negative for shortness of breath.   Cardiovascular: Negative for chest pain.  Gastrointestinal: Positive for abdominal pain, constipation and diarrhea.       +dysphagia   Musculoskeletal: Negative for falls.  Skin: Negative for rash.  Psychiatric/Behavioral: Negative for memory loss.   Past Medical History:  Diagnosis Date  . Allergy   . Anxiety   . Balance disorder   . Chronic diastolic CHF (congestive heart failure) ([place])   . Colon polyps   . Coronary artery disease, non-occlusive    a. LHC 11/2003: 10% LAD stenosis, 20% LCx stenosis, and 40% RCA stenosis; b. nuc stress test 12/15: small region of mild ischemia in the apical region with WMA also noted in the apical region, EF 60%. She declined invasive evaluation at that time  . DM2 (diabetes mellitus, type 2) ([place])   . Fall    04/2017 [facility]where [facility] multiple fractures, brusing   . Falls frequently    h/o left shoudler/wrist fracture and left fingers numb  . GERD (gastroesophageal reflux disease)     esophageal web  . 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 History of chicken pox   . History of eating disorder   . HLD (hyperlipidemia)   . HTN (hypertension)   . Hypothyroidism   . IBS (irritable bowel syndrome)    diarrhea   . Incontinence of bowel   . Osteoporosis   . Ovarian cancer ([place])   . Persistent atrial fibrillation ([place])    a. CHADS2VASc = > 8 (CHF, HTN, age x 2, DM, TIA x 2, female); b. on Xarelto  . SBO (small bowel obstruction) ([place])    1998  . TIA (transient ischemic attack)   . UTI (urinary tract infection)   . Venous insufficiency    Past Surgical History:  Procedure Laterality Date  . 2nd look laparotomy  1982  . ABDOMINAL HYSTERECTOMY     for ovarian cancer s/p hysterectomy total 1976 and exp lap 1980  . APPENDECTOMY    . CARDIAC CATHETERIZATION  8/05   neg; a fib found   . CARPAL TUNNEL RELEASE Left 03/06/2018   Procedure: CARPAL TUNNEL RELEASE ENDOSCOPIC;  Surgeon: Corky Mull, MD;  Location: [facility];  Service: Orthopedics;  Laterality: Left;  diabetic - oral meds  . CARPAL TUNNEL RELEASE     left CTS Dr.Poggi  . cataract OD  2002  . cataract surgery  5/07  R  . CHOLECYSTECTOMY  1986  . DEXA  9/04 and 1/02  . EGD/dilation/colon  1/06  . ESOPHAGOGASTRODUODENOSCOPY (EGD) WITH PROPOFOL N/A 05/16/2016   Procedure: ESOPHAGOGASTRODUODENOSCOPY (EGD) WITH PROPOFOL with dilation;  Surgeon: Lucilla Lame, MD;  Location: [facility];  Service: Endoscopy;  Laterality: N/A;  . FEMORAL HERNIA REPAIR  1958   R  . fracture L elbow/wrist  2000  . intussception/obstruction  12/98  . JOINT REPLACEMENT     left shoulder  . kidney stone x2  1991  . laminectomy L4-5  1971  . LUMBAR LAMINECTOMY     1970 ruptured disc   . MOUTH SURGERY    . myoview stress  8/05   syncope (-)  . REVERSE SHOULDER ARTHROPLASTY Left 10/06/2014   Procedure: REVERSE SHOULDER ARTHROPLASTY;  Surgeon: Corky Mull, MD;  Location: [facility];  Service: Orthopedics;   Laterality: Left;  . stillbirth  1969  . TOTAL ABDOMINAL HYSTERECTOMY W/ BILATERAL SALPINGOOPHORECTOMY  1980   ovarian cancer  . WRIST FRACTURE SURGERY  11/05   R   Family History  Problem Relation Age of Onset  . Early death Father   . Diabetes Mother   . Heart disease Mother   . Arthritis Brother   . Cancer Brother   . Depression Brother   . Hearing loss Brother   . Arthritis Brother   . Cancer Brother        colon  . Hearing loss Brother   . Cancer Brother   . Diabetes Brother   . Hearing loss Brother   . Heart disease Brother   . Hyperlipidemia Brother   . Heart disease Sister   . Hypertension Sister   . Stroke Sister   . Hearing loss Daughter   . Hypertension Daughter   . Diabetes Paternal Grandfather   . Hearing loss Sister   . Heart disease Sister   . Arthritis Sister   . Depression Sister   . Hearing loss Sister   . Heart disease Sister   . COPD Brother   . Early death Brother   . Early death Brother    Social History   Socioeconomic History  . Marital status: Widowed    Spouse name: Not on file  . Number of children: 1  . Years of education: Not on file  . Highest education level: Not on file  Occupational History  . 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 Smoking status: Never Smoker  . Smokeless tobacco: Never Used  Substance and Sexual Activity  . Alcohol use: No    Comment: may have occasional glass of wine  . Drug use: No  . Sexual activity: Not Currently  Other Topics Concern  . 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 Difficulty of  Paying Living Expenses:   Food Insecurity:   . Worried About Charity fundraiser in the Last Year:   . Arboriculturist in the Last Year:   Transportation Needs:   .  Lack of Transportation (Medical):   Marland Kitchen Lack of Transportation (Non-Medical):   Physical Activity:   . Days of Exercise per Week:   . Minutes of Exercise per Session:   Stress:   . Feeling of Stress :   Social Connections:   . Frequency of Communication with Friends and Family:   . Frequency of Social Gatherings with Friends and Family:   . Attends Religious Services:   . Active Member of Clubs or Organizations:   . Attends Archivist Meetings:   Marland Kitchen Marital Status:   Intimate Partner Violence:   . Fear of Current or Ex-Partner:   . Emotionally Abused:   Marland Kitchen Physically Abused:   . Sexually Abused:    Current Meds  Medication Sig  . acetaminophen (TYLENOL) 500 MG tablet Take 1,000 mg by mouth every 6 (six) hours as needed for moderate pain or headache.  . Ascorbic Acid (VITAMIN C) POWD Take 200 mg by mouth daily.   . bisoprolol (ZEBETA) 5 MG tablet Take 1 tablet (5 mg total) by mouth daily.  Marland Kitchen co-enzyme Q-10 30 MG capsule Take 30 mg by mouth daily.   Marland Kitchen estradiol (ESTRACE) 0.1 MG/GM vaginal cream APPLY 0.5MG (PEA SIZED AMOUNT) JUST INSIDE THE VAGINA WITH FINGER-TIP ON MONDAY, WEDNESDAY AND FRIDAY NIGHTS.  Marland Kitchen FOLIC [id number] PO Take by mouth.  . furosemide (LASIX) 20 MG tablet Take 1 tablet (20 mg total) by mouth 2 (two) times daily.  Marland Kitchen glucose blood (ONE TOUCH ULTRA TEST) test strip Use to test blood sugar once daily E11.49 (Patient taking differently: Use to test blood sugar twice daily E11.49)  . levothyroxine (SYNTHROID, LEVOTHROID) 50 MCG tablet Take 50 mcg by mouth daily before breakfast.  . linagliptin (TRADJENTA) 5 MG TABS tablet Take by mouth.  . losartan (COZAAR) 25 MG tablet Take 0.5 tablets (12.5 mg total) by mouth daily.  . metFORMIN (GLUCOPHAGE-XR) 500 MG 24 hr tablet Take 1,000 mg by mouth 2 (two)  times daily.  . Multiple Vitamin (CALCIUM COMPLEX PO) Take 1 tablet by mouth daily.  . NON FORMULARY Trivita Supplement B12 Takes qd. B6 qd, and folic acid  . Omega-3 1000 MG CAPS Take 2,000 mg by mouth daily.   . potassium chloride (KLOR-CON) 10 MEQ tablet Take 1 tablet (10 mEq total) by mouth 2 (two) times daily.  . psyllium (METAMUCIL) 58.6 % packet Take 1 packet by mouth 2 (two) times daily.   . rivaroxaban (XARELTO) 20 MG TABS tablet Take 1 tablet (20 mg total) by mouth daily with supper.  Marland Kitchen SANTYL ointment Apply 1 application topically daily.   Allergies  Allergen Reactions  . Amiodarone Other (See Comments)    Severe Thyroid issues   . Penicillins Anaphylaxis and Other (See Comments)    Has patient had a PCN reaction causing immediate rash, facial/tongue/throat swelling, SOB or lightheadedness with hypotension: Yes Has patient had a PCN reaction causing severe rash involving mucus membranes or skin necrosis: No Has patient had a PCN reaction that required hospitalization: No Has patient had a PCN reaction occurring within the last 10 years: No If all of the above answers are "NO", then may proceed with Cephalosporin use.   . Actos [Pioglitazone]     Not effective   . Amaryl [Glimepiride]     Not effective    . Atorvastatin Other (See Comments)    Muscle aches & All statins per Patient  . Ciprofloxacin Other (See Comments)    High blood pressure    .  Codeine Nausea And Vomiting  . Ezetimibe-Simvastatin Other (See Comments)    Muscle aches, nausea, back pain   . Fosamax [Alendronate]     dysphagia  . Lovastatin Other (See Comments)    Myalgias  . Macrobid [Nitrofurantoin Macrocrystal]     Severe Itching, rash   . Protonix [Pantoprazole Sodium]     Esophageal problems    . Rosiglitazone Maleate Other (See Comments)    Edema  . Statins     Muscle and joint aches to all statins  . Victoza [Liraglutide]     Nausea   . Alendronate Sodium Rash    Dysphagia and  ulceration   . Bactrim [Sulfamethoxazole-Trimethoprim] Rash    itching  . Sulfa Antibiotics Rash    Itching    Recent Results (from the past 2160 hour(s))  Bladder Scan (Post Void Residual) in office     Status: None   Collection Time: 06/26/19  1:51 PM  Result Value Ref Range   Scan Result 48    Objective  Body mass index is 30.47 kg/m. Wt Readings from Last 3 Encounters:  08/21/19 172 lb (78 kg)  08/21/19 172 lb (78 kg)  08/14/19 172 lb (78 kg)   Temp Readings from Last 3 Encounters:  08/21/19 98.5 F (36.9 C) (Oral)  07/31/19 (!) 97.1 F (36.2 C)  07/03/19 (!) 97.5 F (36.4 C)   BP Readings from Last 3 Encounters:  08/21/19 132/78  08/21/19 (!) 150/83  08/14/19 (!) 171/76   Pulse Readings from Last 3 Encounters:  08/21/19 93  08/21/19 74  08/14/19 92    Physical Exam Vitals and nursing note reviewed.  Constitutional:      Appearance: Normal appearance. She is well-developed and well-groomed. She is obese.  HENT:     Head: Normocephalic and atraumatic.  Eyes:     Conjunctiva/sclera: Conjunctivae normal.     Pupils: Pupils are equal, round, and reactive to light.  Cardiovascular:     Rate and Rhythm: Normal rate and regular rhythm.     Heart sounds: Normal heart sounds. No murmur.  Pulmonary:     Effort: Pulmonary effort is normal.     Breath sounds: Normal breath sounds.  Skin:    General: Skin is warm and dry.  Neurological:     General: No focal deficit present.     Mental Status: She is alert and oriented to person, place, and time. Mental status is at baseline.     Gait: Gait normal.  Psychiatric:        Attention and Perception: Attention and perception normal.        Mood and Affect: Mood and affect normal.        Speech: Speech normal.        Behavior: Behavior normal. Behavior is cooperative.        Thought Content: Thought content normal.        Cognition and Memory: Cognition and memory normal.        Judgment: Judgment normal.      Assessment  Plan  Abnormal gait - Plan: Ambulatory referral to [facility]  Dysphagia, diarrhea/constipation/epigastric ab pain F/u [name]  Hypothyroidism, unspecified type -  On levo 50 mcg  Type 2 diabetes mellitus with neurological manifestations, controlled ([place]) - Plan: Comprehensive metabolic panel, Lipid panel, CBC with Differential/Platelet, Urinalysis, Routine w reflex microscopic, Hemoglobin A1c, Microalbumin / creatinine urine ratio, F/u [facility]  On metformin 1000 xr bid, tradjenta 5 mg qd  Essential hypertension - Plan: Comprehensive metabolic panel, Lipid panel, CBC with Differential/Platelet Cont losartan 12.5 mg qd Lasix 20 mg bid  zebeta 5 mg qd   Stasis ulcer of lower extremity  With unna boots on now f/u vascular pending lymphopress  F/u podiatry   HM Fasting labs 09/08/19 or after Flu shot 10/23/19will need upcoming  prevnar had 03/03/16  covid vx had 2/2 pfizer   Check [facility] for pna 23had 2005 per pt stated [facility] of proof if not will need pna 23 Tdaputd zostervax had  Consider shingrixfuture  Declines check MMR Reviewed B12 lab normal 957 06/28/17  Out of age window:  mammo Paps/p hysterectomy Colonoscopy  Declines DEXA  She is established with dermatology h/o BCCDr. Istenstein now [facility] leg  05/01/2018 Dr. Roland Rack left CTS left f/u CTS release doing betterbut 12/26/18 c/w left hand numbness  cards Dr. Rockey Situ due to f/u 07/2019  GI-Dr.Wohl -pt wants to hold on referral  Endocrine [facility] Dr. Rueben Bash f/u sch as of 12/2018  of note with h/o CAD, atherosclerosis consider US carotid in future   Provider: Dr. [name]-Internal Medicine

## 2019-08-25 ENCOUNTER — Other Ambulatory Visit: Payer: Self-pay | Admitting: Cardiovascular Disease

## 2019-08-25 ENCOUNTER — Telehealth: Payer: Self-pay | Admitting: Internal Medicine

## 2019-08-25 NOTE — Telephone Encounter (Signed)
Faxed

## 2019-08-25 NOTE — Telephone Encounter (Signed)
Patient wanted to know which agencies does Dr. Aundra Dubin use for physical therapy. She really would like more information on the process of setting up pt.

## 2019-08-26 ENCOUNTER — Ambulatory Visit: Payer: Medicare HMO | Admitting: Podiatry

## 2019-08-26 ENCOUNTER — Telehealth: Payer: Self-pay | Admitting: Internal Medicine

## 2019-08-26 ENCOUNTER — Other Ambulatory Visit: Payer: Self-pay

## 2019-08-26 ENCOUNTER — Encounter: Payer: Self-pay | Admitting: Internal Medicine

## 2019-08-26 DIAGNOSIS — M79676 Pain in unspecified toe(s): Secondary | ICD-10-CM | POA: Diagnosis not present

## 2019-08-26 DIAGNOSIS — E1140 Type 2 diabetes mellitus with diabetic neuropathy, unspecified: Secondary | ICD-10-CM | POA: Diagnosis not present

## 2019-08-26 DIAGNOSIS — M205X1 Other deformities of toe(s) (acquired), right foot: Secondary | ICD-10-CM

## 2019-08-26 DIAGNOSIS — L97512 Non-pressure chronic ulcer of other part of right foot with fat layer exposed: Secondary | ICD-10-CM

## 2019-08-26 DIAGNOSIS — B351 Tinea unguium: Secondary | ICD-10-CM | POA: Diagnosis not present

## 2019-08-26 DIAGNOSIS — D689 Coagulation defect, unspecified: Secondary | ICD-10-CM

## 2019-08-26 DIAGNOSIS — E1149 Type 2 diabetes mellitus with other diabetic neurological complication: Secondary | ICD-10-CM

## 2019-08-26 NOTE — Telephone Encounter (Signed)
Patient wanted to know which agencies does Dr. Aundra Dubin use for physical therapy. She really would like more information on the process of setting up pt.       Documentation

## 2019-08-26 NOTE — Telephone Encounter (Signed)
-----   Message from Delorise Jackson, MD sent at 08/21/2019  6:37 PM EDT ----- Can you verify and log covid 19 vaccines x 2

## 2019-08-26 NOTE — Telephone Encounter (Signed)
Please advise 

## 2019-08-26 NOTE — Telephone Encounter (Signed)
See 08/25/19 phone encounter.

## 2019-08-27 ENCOUNTER — Encounter: Payer: Self-pay | Admitting: Podiatry

## 2019-08-27 NOTE — Progress Notes (Signed)
Subjective:  Patient ID: Jodi Reeves, female    DOB: 09/11/1927,  MRN: [id number]  Chief Complaint  Patient presents with  . Foot Pain    pt is here for right foot great toe ulcer, pt states that the right great toe ulcer is doing a lot better, pt is also looking to get diabetic shoes as well.     86 y.o. female presents for wound care.  Patient presents with right hallux distal tip ulceration.  Patient states that she is doing well overall.  She has been applying Santyl wet-to-dry dressing change it has had made the ulcer go down considerably.  She would also like to have the toenails debrided down is causing her a lot of pain.  She would like to debride that down because she has pain with ambulation.  Her toenails thickened elongated mycotic dystrophic painful on palpation x10.  She denies any other acute complaints.  Review of Systems: Negative except as noted in the HPI. Denies N/V/F/Ch.  Past Medical History:  Diagnosis Date  . Allergy   . Anxiety   . Balance disorder   . Chronic diastolic CHF (congestive heart failure) ([place])   . Colon polyps   . Coronary artery disease, non-occlusive    a. LHC 11/2003: 10% LAD stenosis, 20% LCx stenosis, and 40% RCA stenosis; b. nuc stress test 12/15: small region of mild ischemia in the apical region with WMA also noted in the apical region, EF 60%. She declined invasive evaluation at that time  . DM2 (diabetes mellitus, type 2) ([place])   . Fall    04/2017 [facility]where [facility] multiple fractures, brusing   . Falls frequently    h/o left shoudler/wrist fracture and left fingers numb  . GERD (gastroesophageal reflux disease)    esophageal web  . 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 History of chicken pox   . History of eating disorder   . HLD (hyperlipidemia)   . HTN (hypertension)   . Hypothyroidism   . IBS (irritable bowel syndrome)    diarrhea   . Incontinence of bowel   . Osteoporosis   . Ovarian cancer ([place])   .  Persistent atrial fibrillation ([place])    a. CHADS2VASc = > 8 (CHF, HTN, age x 2, DM, TIA x 2, female); b. on Xarelto  . SBO (small bowel obstruction) ([place])    1998  . TIA (transient ischemic attack)   . UTI (urinary tract infection)   . Venous insufficiency     Current Outpatient Medications:  .  acetaminophen (TYLENOL) 500 MG tablet, Take 1,000 mg by mouth every 6 (six) hours as needed for moderate pain or headache., Disp: , Rfl:  .  Ascorbic Acid (VITAMIN C) POWD, Take 200 mg by mouth daily. , Disp: , Rfl:  .  bisoprolol (ZEBETA) 5 MG tablet, Take 1 tablet (5 mg total) by mouth daily., Disp: 90 tablet, Rfl: 0 .  co-enzyme Q-10 30 MG capsule, Take 30 mg by mouth daily. , Disp: , Rfl:  .  estradiol (ESTRACE) 0.1 MG/GM vaginal cream, APPLY 0.5MG  (PEA SIZED AMOUNT) JUST INSIDE THE VAGINA WITH FINGER-TIP ON MONDAY, WEDNESDAY AND FRIDAY NIGHTS., Disp: 123XX123 g, Rfl: 0 .  FOLIC 99991111 PO, Take by mouth., Disp: , Rfl:  .  furosemide (LASIX) 20 MG tablet, Take 1 tablet (20 mg total) by mouth 2 (two) times daily., Disp: 190 tablet, Rfl: 3 .  glucose blood (ONE TOUCH ULTRA TEST) test strip,  Use to test blood sugar once daily E11.49 (Patient taking differently: Use to test blood sugar twice daily E11.49), Disp: 100 each, Rfl: 1 .  levothyroxine (SYNTHROID, LEVOTHROID) 50 MCG tablet, Take 50 mcg by mouth daily before breakfast., Disp: , Rfl:  .  linagliptin (TRADJENTA) 5 MG TABS tablet, Take by mouth., Disp: , Rfl:  .  metFORMIN (GLUCOPHAGE-XR) 500 MG 24 hr tablet, Take 1,000 mg by mouth 2 (two) times daily., Disp: , Rfl:  .  Multiple Vitamin (CALCIUM COMPLEX PO), Take 1 tablet by mouth daily., Disp: , Rfl:  .  NON FORMULARY, Trivita Supplement B12 Takes qd. B6 qd, and folic acid, Disp: , Rfl:  .  Omega-3 1000 MG CAPS, Take 2,000 mg by mouth daily. , Disp: , Rfl:  .  potassium chloride (KLOR-CON) 10 MEQ tablet, Take 1 tablet (10 mEq total) by mouth 2 (two) times daily., Disp: 180 tablet, Rfl: 3 .   psyllium (METAMUCIL) 58.6 % packet, Take 1 packet by mouth 2 (two) times daily. , Disp: , Rfl:  .  rivaroxaban (XARELTO) 20 MG TABS tablet, Take 1 tablet (20 mg total) by mouth daily with supper., Disp: 30 tablet, Rfl: 6 .  SANTYL ointment, Apply 1 application topically daily., Disp: 30 g, Rfl: 1 .  losartan (COZAAR) 25 MG tablet, Take 0.5 tablets (12.5 mg total) by mouth daily., Disp: 45 tablet, Rfl: 0  Social History   Tobacco Use  Smoking Status Never Smoker  Smokeless Tobacco Never Used    Allergies  Allergen Reactions  . Amiodarone Other (See Comments)    Severe Thyroid issues   . Penicillins Anaphylaxis and Other (See Comments)    Has patient had a PCN reaction causing immediate rash, facial/tongue/throat swelling, SOB or lightheadedness with hypotension: Yes Has patient had a PCN reaction causing severe rash involving mucus membranes or skin necrosis: No Has patient had a PCN reaction that required hospitalization: No Has patient had a PCN reaction occurring within the last 10 years: No If all of the above answers are "NO", then may proceed with Cephalosporin use.   . Actos [Pioglitazone]     Not effective   . Amaryl [Glimepiride]     Not effective    . Atorvastatin Other (See Comments)    Muscle aches & All statins per Patient  . Ciprofloxacin Other (See Comments)    High blood pressure    . Codeine Nausea And Vomiting  . Ezetimibe-Simvastatin Other (See Comments)    Muscle aches, nausea, back pain   . Fosamax [Alendronate]     dysphagia  . Lovastatin Other (See Comments)    Myalgias  . Macrobid [Nitrofurantoin Macrocrystal]     Severe Itching, rash   . Protonix [Pantoprazole Sodium]     Esophageal problems    . Rosiglitazone Maleate Other (See Comments)    Edema  . Statins     Muscle and joint aches to all statins  . Victoza [Liraglutide]     Nausea   . Alendronate Sodium Rash    Dysphagia and ulceration   . Bactrim [Sulfamethoxazole-Trimethoprim] Rash      itching  . Sulfa Antibiotics Rash    Itching    Objective:   There were no vitals filed for this visit. There is no height or weight on file to calculate BMI. Constitutional Well developed. Well nourished.  Vascular Dorsalis pedis pulses palpable bilaterally. Posterior tibial pulses palpable bilaterally. Capillary refill normal to all digits.  No cyanosis or clubbing noted. Pedal hair  growth normal.  Neurologic Normal speech. Oriented to person, place, and time. Protective sensation absent  Dermatologic Wound Location: Right hallux ulceration Wound Base: Granular/Healthy Peri-wound: Hyperkeratotic Exudate: Scant/small amount Serous exudate Wound Measurements: -See below  Thickened elongated dystrophic painful discolored toenails x10  Orthopedic:  Left second digit hammertoe contracture semiflexible in nature.  Mild pain on palpation.   Radiographs: None Assessment:   1. Diabetic neuropathy with neurologic complication ([place])   2. Chronic ulcer of right great toe with fat layer exposed ([place])   3. Coagulation disorder ([facility])   4. Pain due to onychomycosis of toenail   5. Toe contracture, right    Plan:  Patient was evaluated and treated and all questions answered.  Ulcer right hallux distal tip ulceration~decreasing -Debridement as below. -Dressed with Santyl wet-to-dry, DSD. -Continue off-loading with surgical shoe. -Continue Santyl wet-to-dry dressing changes for now.  The wound itself is improving considerably.  Left second digit hammertoe contracture/toe contracture -I explained to the patient the etiology of hammertoe contracture and various treatment options were extensively discussed with the patient.  At this time given her pain is mild to moderate in nature I will hold off on any kind of surgical intervention.  I believe she will benefit from offloading a wider shoe gear modification.  I discussed with the patient to obtain shoe gear that is wider in the  toebox region.  Patient states understanding and will do so -I believe patient will benefit from diabetic shoes as patient has hammertoe contracture as well as ulceration present.  She will be scheduled see Liliane Channel for diabetic shoes  Procedure: Excisional Debridement of Wound Rationale: Removal of non-viable soft tissue from the wound to promote healing.  Anesthesia: none Pre-Debridement Wound Measurements: 0.6 cm x 0.6 cm x 0.2 cm Post-Debridement Wound Measurements: Same Type of Debridement: Sharp Excisional Tissue Removed: Non-viable soft tissue Depth of Debridement: subcutaneous tissue. Technique: Sharp excisional debridement to bleeding, viable wound base.  Dressing: Dry, sterile, compression dressing. Disposition: Patient tolerated procedure well. Patient to return in 1 week for follow-up.  Onychomycosis with pain  -Nails palliatively debrided as below. -Educated on self-care  Procedure: Nail Debridement Rationale: pain  Type of Debridement: manual, sharp debridement. Instrumentation: Nail nipper, rotary burr. Number of Nails: 10  Procedures and Treatment: Consent by patient was obtained for treatment procedures. The patient understood the discussion of treatment and procedures well. All questions were answered thoroughly reviewed. Debridement of mycotic and hypertrophic toenails, 1 through 5 bilateral and clearing of subungual debris. No ulceration, no infection noted.  Return Visit-Office Procedure: Patient instructed to return to the office for a follow up visit 3 months for continued evaluation and treatment.  Boneta Lucks, DPM    Return in about 5 weeks (around 09/30/2019), or See Dr. Posey Pronto, for See Liliane Channel for Diabetic shoes ASAP.   Return in about 5 weeks (around 09/30/2019), or See Dr. Posey Pronto, for See Liliane Channel for Diabetic shoes ASAP.

## 2019-08-28 ENCOUNTER — Encounter (INDEPENDENT_AMBULATORY_CARE_PROVIDER_SITE_OTHER): Payer: Self-pay

## 2019-08-28 ENCOUNTER — Other Ambulatory Visit: Payer: Self-pay

## 2019-08-28 ENCOUNTER — Ambulatory Visit (INDEPENDENT_AMBULATORY_CARE_PROVIDER_SITE_OTHER): Payer: Medicare HMO | Admitting: Nurse Practitioner

## 2019-08-28 VITALS — BP 146/86 | HR 84 | Resp 16 | Wt 170.6 lb

## 2019-08-28 DIAGNOSIS — L97919 Non-pressure chronic ulcer of unspecified part of right lower leg with unspecified severity: Secondary | ICD-10-CM

## 2019-08-28 DIAGNOSIS — I83029 Varicose veins of left lower extremity with ulcer of unspecified site: Secondary | ICD-10-CM | POA: Diagnosis not present

## 2019-08-28 DIAGNOSIS — L97929 Non-pressure chronic ulcer of unspecified part of left lower leg with unspecified severity: Secondary | ICD-10-CM

## 2019-08-28 DIAGNOSIS — I83019 Varicose veins of right lower extremity with ulcer of unspecified site: Secondary | ICD-10-CM | POA: Diagnosis not present

## 2019-08-28 NOTE — Progress Notes (Signed)
History of Present Illness  There is no documented history at this time  Assessments & Plan   There are no diagnoses linked to this encounter.    Additional instructions  Subjective:  Patient presents with venous ulcer of the Bilateral lower extremity.    Procedure:  3 layer unna wrap was placed Bilateral lower extremity.   Plan:   Follow up in one week.  

## 2019-09-04 ENCOUNTER — Encounter (INDEPENDENT_AMBULATORY_CARE_PROVIDER_SITE_OTHER): Payer: Self-pay | Admitting: Vascular Surgery

## 2019-09-04 ENCOUNTER — Ambulatory Visit (INDEPENDENT_AMBULATORY_CARE_PROVIDER_SITE_OTHER): Payer: Medicare HMO

## 2019-09-04 ENCOUNTER — Other Ambulatory Visit: Payer: Self-pay

## 2019-09-04 ENCOUNTER — Ambulatory Visit (INDEPENDENT_AMBULATORY_CARE_PROVIDER_SITE_OTHER): Payer: Medicare HMO | Admitting: Vascular Surgery

## 2019-09-04 VITALS — BP 153/76 | HR 79 | Resp 16 | Wt 168.0 lb

## 2019-09-04 DIAGNOSIS — I872 Venous insufficiency (chronic) (peripheral): Secondary | ICD-10-CM | POA: Diagnosis not present

## 2019-09-04 DIAGNOSIS — I2510 Atherosclerotic heart disease of native coronary artery without angina pectoris: Secondary | ICD-10-CM

## 2019-09-04 DIAGNOSIS — I10 Essential (primary) hypertension: Secondary | ICD-10-CM

## 2019-09-04 DIAGNOSIS — I739 Peripheral vascular disease, unspecified: Secondary | ICD-10-CM

## 2019-09-04 DIAGNOSIS — I890 Lymphedema, not elsewhere classified: Secondary | ICD-10-CM | POA: Diagnosis not present

## 2019-09-04 DIAGNOSIS — I4819 Other persistent atrial fibrillation: Secondary | ICD-10-CM

## 2019-09-04 NOTE — Telephone Encounter (Signed)
[facility] is taking pt on as a pt.

## 2019-09-05 ENCOUNTER — Telehealth: Payer: Self-pay | Admitting: Internal Medicine

## 2019-09-05 NOTE — Telephone Encounter (Signed)
They need to contact [facility] for this?   [place]

## 2019-09-05 NOTE — Telephone Encounter (Signed)
Rhonda with [facility] home health called and states that physical therapist is requesting nursing eval for wounds and compression wraps. Please advise [phone]

## 2019-09-05 NOTE — Telephone Encounter (Signed)
Okay to give verbal? 

## 2019-09-07 NOTE — Progress Notes (Signed)
I Cardiology Office Note  Date:  09/08/2019   ID:  Jodi Reeves, DOB 22-Jun-1927, MRN [id number]  PCP:  [name], Nino Glow, MD   Chief Complaint  Patient presents with  . office visit    Pt states has a lot of concerns, meds, BP and more. Meds verbally reviewed w/ pt.     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  CT chest 04/20/2016, Calcifications of the aortic arch, mild calcification of the coronary arteries. Moderate calcifications of the abdominal aorta, which is tortuous. Previous stretching of esophagus with Dr. Candace Cruise Chronic leg swelling, venous/lymphedema She presents for follow-up of her atrial fibrillation and chest pain, leg swelling  Presents in wheelchair on today's visit Has a cane Denies any falls Lives alone  3 layer unna wrap was placed Bilateral lower extremity. Has improvement after one week , wraps were taken off Swelling back again today, Got script for a Velcro wrap, clover med, waiting for the right size to come in Scheduled to receive the wraps today  Takes lasix 20 daily up to twice a day, with potassium Overactive bladder  Labs  HBA1C 7.9 No new labs, they have been ordered  Wound on the foot/toe  No chest pain, chronic shortness of breath with exertion  EKG personally reviewed by myself on todays visit Atrial fibrillation rate 75  The past medical history reviewed Prior echo RVSP 47 mm Hg, EF >60% HBA1C 7.4 CR 0.79,   Declined cholesterol medication in the past Still on Xarelto   emergency room March 18, 2017 for chest pain Hospital records reviewed with the patient in detail Cardiac enzymes negative  Outpatient stress test low risk no ischemia  Previous  falls , Went to [facility], ,  Sacral fx Left face bruising, severe, Xarelto held Did not qualify for placement/SNF   Initially required 24 hour care at home walking with a walker,    PT 3x per week, OT,    RN 2 -3 x per week  CT scan chest March 18, 2017 with cardiomegaly and coronary artery calcification, aortic atherosclerosis  Chronic GI issues Previously had diarrhea, now with constipation. Working with GI  CT chest 04/20/2016: Calcifications of the aortic arch. mild calcification of the coronary arteries. Moderate calcifications of the abdominal aorta, which is tortuous. The origins of the celiac, SMA, single bilateral renal arteries are patent. The origin of the IMA is not seen, and may be occluded chronically.   TIA in January 2017, went to [facility] for evaluation She was talking with a friend, had word finding difficulty, friend was finishing her sentences Workup at [facility] reviewed She had CT scan of her head which was essentially normal,  carotid ultrasound that showed mild bilateral disease, no significant stenoses,  echocardiogram with ejection fraction greater than 65%, mild valve abnormality   PMH:   has a past medical history of Allergy, Anxiety, Balance disorder, Chronic diastolic CHF (congestive heart failure) ([place]), Colon polyps, Coronary artery disease, non-occlusive, DM2 (diabetes mellitus, type 2) ([place]), Fall, Falls frequently, GERD (gastroesophageal reflux disease), Granuloma annulare, History of chicken pox, History of eating disorder, HLD (hyperlipidemia), HTN (hypertension), Hypothyroidism, IBS (irritable bowel syndrome), Incontinence of bowel, Osteoporosis, Ovarian cancer ([place]), Persistent atrial fibrillation ([place]), SBO (small bowel obstruction) ([place]), TIA (transient ischemic attack), UTI (urinary tract  infection), and Venous insufficiency.  PSH:    Past Surgical History:  Procedure Laterality Date  . 2nd look laparotomy  1982  . ABDOMINAL HYSTERECTOMY     for ovarian cancer s/p hysterectomy  total 1976 and exp lap 1980  . APPENDECTOMY    . CARDIAC CATHETERIZATION  8/05   neg; a fib found   . CARPAL TUNNEL RELEASE Left 03/06/2018   Procedure: CARPAL TUNNEL RELEASE ENDOSCOPIC;  Surgeon: Corky Mull, MD;  Location: [facility];  Service: Orthopedics;  Laterality: Left;  diabetic - oral meds  . CARPAL TUNNEL RELEASE     left CTS Dr.Poggi  . cataract OD  2002  . cataract surgery  5/07   R  . CHOLECYSTECTOMY  1986  . DEXA  9/04 and 1/02  . EGD/dilation/colon  1/06  . ESOPHAGOGASTRODUODENOSCOPY (EGD) WITH PROPOFOL N/A 05/16/2016   Procedure: ESOPHAGOGASTRODUODENOSCOPY (EGD) WITH PROPOFOL with dilation;  Surgeon: Lucilla Lame, MD;  Location: [facility];  Service: Endoscopy;  Laterality: N/A;  . FEMORAL HERNIA REPAIR  1958   R  . fracture L elbow/wrist  2000  . intussception/obstruction  12/98  . JOINT REPLACEMENT     left shoulder  . kidney stone x2  1991  . laminectomy L4-5  1971  . LUMBAR LAMINECTOMY     1970 ruptured disc   . MOUTH SURGERY    . myoview stress  8/05   syncope (-)  . REVERSE SHOULDER ARTHROPLASTY Left 10/06/2014   Procedure: REVERSE SHOULDER ARTHROPLASTY;  Surgeon: Corky Mull, MD;  Location: [facility];  Service: Orthopedics;  Laterality: Left;  . stillbirth  1969  . TOTAL ABDOMINAL HYSTERECTOMY W/ BILATERAL SALPINGOOPHORECTOMY  1980   ovarian cancer  . WRIST FRACTURE SURGERY  11/05   R    Current Outpatient Medications  Medication Sig Dispense Refill  . acetaminophen (TYLENOL) 500 MG tablet Take 1,000 mg by mouth every 6 (six) hours as needed for moderate pain or headache.    . Ascorbic Acid (VITAMIN C) POWD Take 200 mg by mouth daily.     . bisoprolol (ZEBETA) 5 MG tablet Take 1 tablet (5 mg total) by mouth daily. 90 tablet 0  . co-enzyme Q-10 30 MG capsule Take 30 mg by mouth daily.     Marland Kitchen estradiol (ESTRACE) 0.1 MG/GM vaginal cream APPLY 0.5MG  (PEA SIZED AMOUNT) JUST INSIDE THE VAGINA WITH FINGER-TIP ON MONDAY, WEDNESDAY AND FRIDAY  NIGHTS. 42.5 g 0  . FOLIC 99991111 PO Take by mouth.    . furosemide (LASIX) 20 MG tablet Take 1 tablet (20 mg total) by mouth 2 (two) times daily. 190 tablet 3  . glucose blood (ONE TOUCH ULTRA TEST) test strip Use to test blood sugar once daily E11.49 (Patient taking differently: Use to test blood sugar twice daily E11.49) 100 each 1  . levothyroxine (SYNTHROID, LEVOTHROID) 50 MCG tablet Take 50 mcg by mouth daily before breakfast.    . linagliptin (TRADJENTA) 5 MG TABS tablet Take by mouth.    . losartan (COZAAR) 25 MG tablet Take 0.5 tablets (12.5 mg total) by mouth daily. 45 tablet 0  . metFORMIN (GLUCOPHAGE-XR) 500 MG 24 hr tablet Take 1,000 mg by mouth 2 (two) times daily.    . Multiple Vitamin (CALCIUM COMPLEX PO) Take 1 tablet by mouth daily.    . NON FORMULARY [place] Supplement B12 Takes qd. B6 qd, and folic acid    . Omega-3 1000 MG CAPS Take 2,000 mg by mouth daily.     Marland Kitchen  potassium chloride (KLOR-CON) 10 MEQ tablet Take 1 tablet (10 mEq total) by mouth 2 (two) times daily. 180 tablet 3  . psyllium (METAMUCIL) 58.6 % packet Take 1 packet by mouth 2 (two) times daily.     . rivaroxaban (XARELTO) 20 MG TABS tablet Take 1 tablet (20 mg total) by mouth daily with supper. 30 tablet 6  . SANTYL ointment Apply 1 application topically daily. 30 g 1   No current facility-administered medications for this visit.    Allergies:   Amiodarone, Penicillins, Actos [pioglitazone], Amaryl [glimepiride], Atorvastatin, Ciprofloxacin, Codeine, Ezetimibe-simvastatin, Fosamax [alendronate], Lovastatin, Macrobid [nitrofurantoin macrocrystal], Protonix [pantoprazole sodium], Rosiglitazone maleate, Statins, Victoza [liraglutide], Alendronate sodium, Bactrim [sulfamethoxazole-trimethoprim], and Sulfa antibiotics   Social History:  The patient  reports that she has never smoked. She has never used smokeless tobacco. She reports that she does not drink alcohol or use drugs.   Family History:   family  history includes Arthritis in her brother, brother, and sister; COPD in her brother; Cancer in her brother, brother, and brother; Depression in her brother and sister; Diabetes in her brother, mother, and paternal grandfather; Early death in her brother, brother, and father; Hearing loss in her brother, brother, brother, daughter, sister, and sister; Heart disease in her brother, mother, sister, sister, and sister; Hyperlipidemia in her brother; Hypertension in her daughter and sister; Stroke in her sister.    Review of Systems: Review of Systems  Constitutional:       Weight gain  HENT: Negative.   Respiratory: Negative.   Cardiovascular: Positive for leg swelling.  Gastrointestinal: Negative.   Musculoskeletal: Negative.        Unsteady gait  Neurological: Negative.   Psychiatric/Behavioral: Positive for substance abuse.  All other systems reviewed and are negative.   PHYSICAL EXAM: VS:  BP 130/80 (BP Location: Left Arm, Patient Position: Sitting, Cuff Size: Normal)   Pulse 75   Ht 5\' 3"  (1.6 m)   Wt 165 lb 2 oz (74.9 kg)   SpO2 97%   BMI 29.25 kg/m  , BMI Body mass index is 29.25 kg/m. Constitutional:  oriented to person, place, and time. No distress.  In a wheelchair HENT:  Head: Grossly normal Eyes:  no discharge. No scleral icterus.  Neck: No JVD, no carotid bruits  Cardiovascular: Regular rate and rhythm, no murmurs appreciated 2+ LE edema Pulmonary/Chest: Clear to auscultation bilaterally, no wheezes or rails Abdominal: Soft.  no distension.  no tenderness.  Musculoskeletal: Normal range of motion Neurological:  normal muscle tone. Coordination normal. No atrophy Skin: Skin warm and dry Psychiatric: normal affect, pleasant  Recent Labs: 11/18/2018: ALT 9; BUN 19; Creatinine, Ser 0.79; Hemoglobin 13.3; Platelets 167.0; Potassium 4.1; Sodium 139; TSH 1.61    Lipid Panel Lab Results  Component Value Date   CHOL 183 05/03/2018   HDL 66.90 05/03/2018   LDLCALC 99  05/03/2018   TRIG 87.0 05/03/2018      Wt Readings from Last 3 Encounters:  09/08/19 165 lb 2 oz (74.9 kg)  09/04/19 168 lb (76.2 kg)  08/28/19 170 lb 9.6 oz (77.4 kg)      ASSESSMENT AND PLAN:  Pulmonary hypertension Prior Echocardiogram right heart pressures approximately 47 mmHg Weight down On lasix BID, labs pending through primary care  Mixed hyperlipidemia  CT scan including mild coronary calcifications, aortic arch calcifications, moderate aortic atherosclerosis in the abdominal aorta  does not want a statin   Lymphedema: Did well on unna wraps, removed after 1 week, significant swelling  has recurred Will get Velcro wraps today Will defer to Dr. Ronalee Belts whether she would benefit from lymphedema compression pumps.  She has follow-up in 2 weeks  Essential hypertension, benign - Plan: EKG 12-Lead Blood pressure is well controlled on today's visit. No changes made to the medications.  Persistent atrial fibrillation (HCC) Tolerating Xarelto, no recent falls Rate controlled stable  Chronic diastolic CHF (congestive heart failure) (HCC) Weight stable, chronic leg swelling (venous insuff/lymphedema) On lasix BID, we will follow labs done through PMD  Diabetes: Followed by endocrine, numbers doing better  Lymphedema Followed by Dr. Ronalee Belts Responded to wraps Getting velcro wraps today   Total encounter time more than 25 minutes  Greater than 50% was spent in counseling and coordination of care with the patient  Disposition:   F/U  6 months   No orders of the defined types were placed in this encounter.   Signed, Esmond Plants, M.D., Ph.D. 09/08/2019  [place], [place]

## 2019-09-08 ENCOUNTER — Ambulatory Visit (INDEPENDENT_AMBULATORY_CARE_PROVIDER_SITE_OTHER): Payer: Medicare HMO | Admitting: Cardiovascular Disease

## 2019-09-08 ENCOUNTER — Encounter: Payer: Self-pay | Admitting: Cardiovascular Disease

## 2019-09-08 ENCOUNTER — Other Ambulatory Visit: Payer: Self-pay

## 2019-09-08 VITALS — BP 130/80 | HR 75 | Ht 63.0 in | Wt 165.1 lb

## 2019-09-08 DIAGNOSIS — E1149 Type 2 diabetes mellitus with other diabetic neurological complication: Secondary | ICD-10-CM | POA: Diagnosis not present

## 2019-09-08 DIAGNOSIS — I4819 Other persistent atrial fibrillation: Secondary | ICD-10-CM

## 2019-09-08 DIAGNOSIS — I5032 Chronic diastolic (congestive) heart failure: Secondary | ICD-10-CM | POA: Diagnosis not present

## 2019-09-08 DIAGNOSIS — I2510 Atherosclerotic heart disease of native coronary artery without angina pectoris: Secondary | ICD-10-CM

## 2019-09-08 DIAGNOSIS — I700 Atherosclerosis of aorta: Secondary | ICD-10-CM

## 2019-09-08 DIAGNOSIS — I10 Essential (primary) hypertension: Secondary | ICD-10-CM

## 2019-09-08 DIAGNOSIS — R600 Localized edema: Secondary | ICD-10-CM

## 2019-09-08 DIAGNOSIS — I272 Pulmonary hypertension, unspecified: Secondary | ICD-10-CM

## 2019-09-08 DIAGNOSIS — R079 Chest pain, unspecified: Secondary | ICD-10-CM

## 2019-09-08 NOTE — Telephone Encounter (Signed)
Spoke with Suanne Marker and informed of this. States she will inform the nurse to contact [facility].

## 2019-09-08 NOTE — Patient Instructions (Addendum)
Referral placed for OT services at [facility] for leg swelling. It may take a couple of weeks for them to call you.   [phone]  Medication Instructions:  No changes  If you need a refill on your cardiac medications before your next appointment, please call your pharmacy.   Lab work: No new labs needed   If you have labs (blood work) drawn today and your tests are completely normal, you will receive your results only by: Marland Kitchen MyChart Message (if you have MyChart) OR . A paper copy in the mail If you have any lab test that is abnormal or we need to change your treatment, we will call you to review the results.   Testing/Procedures: No new testing needed   Follow-Up: At [facility], you and your health needs are our priority.  As part of our continuing mission to provide you with exceptional heart care, we have created designated Provider Care Teams.  These Care Teams include your primary Cardiologist (physician) and Advanced Practice Providers (APPs -  Physician Assistants and Nurse Practitioners) who all work together to provide you with the care you need, when you need it.  . You will need a follow up appointment in 6 months   . Providers on your designated Care Team:   . Murray Hodgkins, NP . Christell Faith, PA-C . Marrianne Mood, PA-C  Any Other Special Instructions Will Be Listed Below (If Applicable).  For educational health videos Log in to : [email or web address] Or : [email or web address], password : [email or web address]

## 2019-09-12 NOTE — Telephone Encounter (Signed)
Documented in Quick abstraction from 12/2018

## 2019-09-15 ENCOUNTER — Telehealth (INDEPENDENT_AMBULATORY_CARE_PROVIDER_SITE_OTHER): Payer: Self-pay

## 2019-09-15 NOTE — Telephone Encounter (Signed)
The pt left a Message and I return the call. The pt is having  Swelling in both legs  And cant get her compression stocking on  She is not having any pain, no wounds or skin color changes or any numbness. The pt want to know what she should do if she should go back to [facility] or just what.

## 2019-09-15 NOTE — Telephone Encounter (Signed)
Please schedule the pt to come in to be placed back in unna boots and make he aware of her appointments her wants to go in asap.

## 2019-09-15 NOTE — Telephone Encounter (Signed)
Well if the patient is experiencing swelling she should try to elevate her lower extremities as much as possible to help with her swelling.  The patient has also been in [place] wraps before and we can also try placing her back into Unna wraps to see if that would help her swelling.  If the patient does elect to go back into Unna wraps, we can bring her in for a nurse visit and schedule a provider follow-up in several weeks.  Otherwise, the patient can return to [facility] to see if there are compression stockings that may fit her better.

## 2019-09-16 ENCOUNTER — Ambulatory Visit (INDEPENDENT_AMBULATORY_CARE_PROVIDER_SITE_OTHER): Payer: Medicare HMO | Admitting: Nurse Practitioner

## 2019-09-16 ENCOUNTER — Other Ambulatory Visit: Payer: Self-pay

## 2019-09-16 VITALS — BP 157/96 | HR 108 | Ht 63.0 in | Wt 165.0 lb

## 2019-09-16 DIAGNOSIS — I890 Lymphedema, not elsewhere classified: Secondary | ICD-10-CM

## 2019-09-16 NOTE — Telephone Encounter (Signed)
Icalled and made the pt aware that we will be calling her to schedule this appt she is also coming in today at [facility].

## 2019-09-16 NOTE — Telephone Encounter (Signed)
CALLED PATIENT BACK X 2 (5/24 & 5/25) NO ANSWER AND NO VM TO LEAVE MSG TO CB TO SCHEDULE APPTS

## 2019-09-16 NOTE — Progress Notes (Signed)
History of Present Illness  There is no documented history at this time  Assessments & Plan   There are no diagnoses linked to this encounter.    Additional instructions  Subjective:  Patient presents with venous ulcer of the Bilateral lower extremity.    Procedure:  3 layer unna wrap was placed Bilateral lower extremity.   Plan:   Follow up in one week.  

## 2019-09-17 ENCOUNTER — Encounter (INDEPENDENT_AMBULATORY_CARE_PROVIDER_SITE_OTHER): Payer: Self-pay | Admitting: Nurse Practitioner

## 2019-09-17 ENCOUNTER — Encounter (INDEPENDENT_AMBULATORY_CARE_PROVIDER_SITE_OTHER): Payer: Self-pay | Admitting: Vascular Surgery

## 2019-09-17 NOTE — Progress Notes (Signed)
MRN : [id number]  Jodi Reeves is a 86 y.o. (05/10/1927) female who presents with chief complaint of  Chief Complaint  Patient presents with  . Follow-up    unna check  .  History of Present Illness:  Patient is seen for follow up evaluation of leg pain and swelling associated with new ulceration. The patient was recently seen here and has been treated with Louretta Parma boot therapy.  The swelling abruptly became much worse bilaterally and is associated with pain and discoloration. The pain and swelling worsens with prolonged dependency and improves with elevation.  The patient notes that in the morning the legs are better but the leg symptoms worsened throughout the course of the day. The patient has also noted a progressive worsening of the discoloration in the ankle and shin area.   Previously noted ulcers are now healed   Current Meds  Medication Sig  . acetaminophen (TYLENOL) 500 MG tablet Take 1,000 mg by mouth every 6 (six) hours as needed for moderate pain or headache.  . Ascorbic Acid (VITAMIN C) POWD Take 200 mg by mouth daily.   . bisoprolol (ZEBETA) 5 MG tablet Take 1 tablet (5 mg total) by mouth daily.  Marland Kitchen co-enzyme Q-10 30 MG capsule Take 30 mg by mouth daily.   Marland Kitchen estradiol (ESTRACE) 0.1 MG/GM vaginal cream APPLY 0.5MG  (PEA SIZED AMOUNT) JUST INSIDE THE VAGINA WITH FINGER-TIP ON MONDAY, WEDNESDAY AND FRIDAY NIGHTS.  Marland Kitchen FOLIC 99991111 PO Take by mouth.  . furosemide (LASIX) 20 MG tablet Take 1 tablet (20 mg total) by mouth 2 (two) times daily.  Marland Kitchen glucose blood (ONE TOUCH ULTRA TEST) test strip Use to test blood sugar once daily E11.49 (Patient taking differently: Use to test blood sugar twice daily E11.49)  . levothyroxine (SYNTHROID, LEVOTHROID) 50 MCG tablet Take 50 mcg by mouth daily before breakfast.  . linagliptin (TRADJENTA) 5 MG TABS tablet Take by mouth.  . metFORMIN (GLUCOPHAGE-XR) 500 MG 24 hr tablet Take 1,000 mg by mouth 2 (two) times daily.  . Multiple Vitamin  (CALCIUM COMPLEX PO) Take 1 tablet by mouth daily.  . NON FORMULARY Trivita Supplement B12 Takes qd. B6 qd, and folic acid  . Omega-3 1000 MG CAPS Take 2,000 mg by mouth daily.   . potassium chloride (KLOR-CON) 10 MEQ tablet Take 1 tablet (10 mEq total) by mouth 2 (two) times daily.  . psyllium (METAMUCIL) 58.6 % packet Take 1 packet by mouth 2 (two) times daily.   . rivaroxaban (XARELTO) 20 MG TABS tablet Take 1 tablet (20 mg total) by mouth daily with supper.  Marland Kitchen SANTYL ointment Apply 1 application topically daily.    Past Medical History:  Diagnosis Date  . Allergy   . Anxiety   . Balance disorder   . Chronic diastolic CHF (congestive heart failure) ([place])   . Colon polyps   . Coronary artery disease, non-occlusive    a. LHC 11/2003: 10% LAD stenosis, 20% LCx stenosis, and 40% RCA stenosis; b. nuc stress test 12/15: small region of mild ischemia in the apical region with WMA also noted in the apical region, EF 60%. She declined invasive evaluation at that time  . DM2 (diabetes mellitus, type 2) ([place])   . Fall    04/2017 see careeverywhere [facility] multiple fractures, brusing   . Falls frequently    h/o left shoudler/wrist fracture and left fingers numb  . GERD (gastroesophageal reflux disease)    esophageal web  . 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 History of chicken pox   . History of eating disorder   . HLD (hyperlipidemia)   . HTN (hypertension)   . Hypothyroidism   . IBS (irritable bowel syndrome)    diarrhea   . Incontinence of bowel   . Osteoporosis   . Ovarian cancer ([place])   . Persistent atrial fibrillation ([place])    a. CHADS2VASc = > 8 (CHF, HTN, age x 2, DM, TIA x 2, female); b. on Xarelto  . SBO (small bowel obstruction) ([place])    1998  . TIA (transient ischemic attack)   . UTI (urinary tract infection)   . Venous insufficiency     Past Surgical History:  Procedure Laterality Date  . 2nd look laparotomy  1982  . ABDOMINAL HYSTERECTOMY     for  ovarian cancer s/p hysterectomy total 1976 and exp lap 1980  . APPENDECTOMY    . CARDIAC CATHETERIZATION  8/05   neg; a fib found   . CARPAL TUNNEL RELEASE Left 03/06/2018   Procedure: CARPAL TUNNEL RELEASE ENDOSCOPIC;  Surgeon: Corky Mull, MD;  Location: [facility];  Service: Orthopedics;  Laterality: Left;  diabetic - oral meds  . CARPAL TUNNEL RELEASE     left CTS Dr.Poggi  . cataract OD  2002  . cataract surgery  5/07   R  . CHOLECYSTECTOMY  1986  . DEXA  9/04 and 1/02  . EGD/dilation/colon  1/06  . ESOPHAGOGASTRODUODENOSCOPY (EGD) WITH PROPOFOL N/A 05/16/2016   Procedure: ESOPHAGOGASTRODUODENOSCOPY (EGD) WITH PROPOFOL with dilation;  Surgeon: Lucilla Lame, MD;  Location: [facility];  Service: Endoscopy;  Laterality: N/A;  . FEMORAL HERNIA REPAIR  1958   R  . fracture L elbow/wrist  2000  . intussception/obstruction  12/98  . JOINT REPLACEMENT     left shoulder  . kidney stone x2  1991  . laminectomy L4-5  1971  . LUMBAR LAMINECTOMY     1970 ruptured disc   . MOUTH SURGERY    . myoview stress  8/05   syncope (-)  . REVERSE SHOULDER ARTHROPLASTY Left 10/06/2014   Procedure: REVERSE SHOULDER ARTHROPLASTY;  Surgeon: Corky Mull, MD;  Location: [facility];  Service: Orthopedics;  Laterality: Left;  . stillbirth  1969  . TOTAL ABDOMINAL HYSTERECTOMY W/ BILATERAL SALPINGOOPHORECTOMY  1980   ovarian cancer  . WRIST FRACTURE SURGERY  11/05   R    Social History Social History   Tobacco Use  . Smoking status: Never Smoker  . Smokeless tobacco: Never Used  Substance Use Topics  . Alcohol use: No    Comment: may have occasional glass of wine  . Drug use: No    Family History Family History  Problem Relation Age of Onset  . Early death Father   . Diabetes Mother   . Heart disease Mother   . Arthritis Brother   . Cancer Brother   . Depression Brother   . Hearing loss Brother   . Arthritis Brother   . Cancer Brother        colon  . Hearing loss Brother    . Cancer Brother   . Diabetes Brother   . Hearing loss Brother   . Heart disease Brother   . Hyperlipidemia Brother   . Heart disease Sister   . Hypertension Sister   . Stroke Sister   . Hearing loss Daughter   . Hypertension Daughter   . Diabetes Paternal Grandfather   . Hearing loss  Sister   . Heart disease Sister   . Arthritis Sister   . Depression Sister   . Hearing loss Sister   . Heart disease Sister   . COPD Brother   . Early death Brother   . Early death Brother     Allergies  Allergen Reactions  . Amiodarone Other (See Comments)    Severe Thyroid issues   . Penicillins Anaphylaxis and Other (See Comments)    Has patient had a PCN reaction causing immediate rash, facial/tongue/throat swelling, SOB or lightheadedness with hypotension: Yes Has patient had a PCN reaction causing severe rash involving mucus membranes or skin necrosis: No Has patient had a PCN reaction that required hospitalization: No Has patient had a PCN reaction occurring within the last 10 years: No If all of the above answers are "NO", then may proceed with Cephalosporin use.   . Actos [Pioglitazone]     Not effective   . Amaryl [Glimepiride]     Not effective    . Atorvastatin Other (See Comments)    Muscle aches & All statins per Patient  . Ciprofloxacin Other (See Comments)    High blood pressure    . Codeine Nausea And Vomiting  . Ezetimibe-Simvastatin Other (See Comments)    Muscle aches, nausea, back pain   . Fosamax [Alendronate]     dysphagia  . Lovastatin Other (See Comments)    Myalgias  . Macrobid [Nitrofurantoin Macrocrystal]     Severe Itching, rash   . Protonix [Pantoprazole Sodium]     Esophageal problems    . Rosiglitazone Maleate Other (See Comments)    Edema  . Statins     Muscle and joint aches to all statins  . Victoza [Liraglutide]     Nausea   . Alendronate Sodium Rash    Dysphagia and ulceration   . Bactrim [Sulfamethoxazole-Trimethoprim] Rash     itching  . Sulfa Antibiotics Rash    Itching      REVIEW OF SYSTEMS (Negative unless checked)  Constitutional: [] Weight loss  [] Fever  [] Chills Cardiac: [] Chest pain   [] Chest pressure   [] Palpitations   [x] Shortness of breath when laying flat   [x] Shortness of breath with exertion. Vascular:  [] Pain in legs with walking   [x] Pain in legs at rest  [] History of DVT   [] Phlebitis   [x] Swelling in legs   [] Varicose veins   [] Non-healing ulcers Pulmonary:   [] Uses home oxygen   [] Productive cough   [] Hemoptysis   [] Wheeze  [] COPD   [] Asthma Neurologic:  [] Dizziness   [] Seizures   [] History of stroke   [] History of TIA  [] Aphasia   [] Vissual changes   [] Weakness or numbness in arm   [] Weakness or numbness in leg Musculoskeletal:   [] Joint swelling   [x] Joint pain   [] Low back pain Hematologic:  [] Easy bruising  [] Easy bleeding   [] Hypercoagulable state   [] Anemic Gastrointestinal:  [] Diarrhea   [] Vomiting  [] Gastroesophageal reflux/heartburn   [] Difficulty swallowing. Genitourinary:  [] Chronic kidney disease   [] Difficult urination  [] Frequent urination   [] Blood in urine Skin:  [] Rashes   [] Ulcers  Psychological:  [] History of anxiety   []  History of major depression.  Physical Examination  Vitals:   09/04/19 1630  BP: (!) 153/76  Pulse: 79  Resp: 16  Weight: 168 lb (76.2 kg)   Body mass index is 29.76 kg/m. Gen: WD/WN, NAD Head: [place]/AT, No temporalis wasting.  Ear/Nose/Throat: Hearing grossly intact, nares w/o erythema or drainage Eyes: PER, EOMI,  sclera nonicteric.  Neck: Supple, no large masses.   Pulmonary:  Good air movement, no audible wheezing bilaterally, no use of accessory muscles.  Cardiac: RRR, no JVD Vascular: scattered varicosities present bilaterally.  Severe venous stasis changes to the legs bilaterally.  2-3+ soft pitting edema Vessel Right Left  Radial Palpable Palpable  PT  not palpable  not palpable  DP  not palpable  not palpable  Gastrointestinal:  Non-distended. No guarding/no peritoneal signs.  Musculoskeletal: M/S 5/5 throughout.  No deformity or atrophy.  Neurologic: CN 2-12 intact. Symmetrical.  Speech is fluent. Motor exam as listed above. Psychiatric: Judgment intact, Mood & affect appropriate for pt's clinical situation. Dermatologic: No rashes or ulcers noted.  No changes consistent with cellulitis. Lymph : No lichenification but there are skin changes of chronic lymphedema.  CBC Lab Results  Component Value Date   WBC 6.3 11/18/2018   HGB 13.3 11/18/2018   HCT 39.9 11/18/2018   MCV 100.8 (H) 11/18/2018   PLT 167.0 11/18/2018    BMET    Component Value Date/Time   NA 139 11/18/2018 1026   K 4.1 11/18/2018 1026   CL 102 11/18/2018 1026   CO2 27 11/18/2018 1026   GLUCOSE 156 (H) 11/18/2018 1026   BUN 19 11/18/2018 1026   BUN 13 01/07/2018 1342   CREATININE 0.79 11/18/2018 1026   CALCIUM 9.5 11/18/2018 1026   GFRNONAA 78 01/07/2018 1342   GFRAA 90 01/07/2018 1342   CrCl cannot be calculated (Patient's most recent lab result is older than the maximum 21 days allowed.).  COAG Lab Results  Component Value Date   INR 1.17 10/06/2014   INR 1.70 10/01/2014   INR 2.0 03/26/2012    Radiology VAS [place] ABI WITH/WO TBI  Result Date: 09/04/2019 LOWER EXTREMITY DOPPLER STUDY Indications: Ulceration.  Performing Technologist: Almira Coaster RVS  Examination Guidelines: A complete evaluation includes at minimum, Doppler waveform signals and systolic blood pressure reading at the level of bilateral brachial, anterior tibial, and posterior tibial arteries, when vessel segments are accessible. Bilateral testing is considered an integral part of a complete examination. Photoelectric Plethysmograph (PPG) waveforms and toe systolic pressure readings are included as required and additional duplex testing as needed. Limited examinations for reoccurring indications may be performed as noted.  ABI Findings:  +---------+------------------+-----+----------+--------+ Right    Rt Pressure (mmHg)IndexWaveform  Comment  +---------+------------------+-----+----------+--------+ Brachial 149                                       +---------+------------------+-----+----------+--------+ ATA      231               1.46 monophasicNC       +---------+------------------+-----+----------+--------+ PTA      250               1.58 monophasicNC       +---------+------------------+-----+----------+--------+ Great Toe93                0.59 Abnormal           +---------+------------------+-----+----------+--------+ +---------+------------------+-----+----------+-------+ Left     Lt Pressure (mmHg)IndexWaveform  Comment +---------+------------------+-----+----------+-------+ Brachial 158                                      +---------+------------------+-----+----------+-------+ ATA      244  1.54 monophasicNC      +---------+------------------+-----+----------+-------+ PTA      244               1.54 monophasicNC      +---------+------------------+-----+----------+-------+ Great Toe95                0.60 Abnormal          +---------+------------------+-----+----------+-------+ +-------+-----------+-----------+------------+------------+ ABI/TBIToday's ABIToday's TBIPrevious ABIPrevious TBI +-------+-----------+-----------+------------+------------+ Right  >1.0 [place]    .60                                 +-------+-----------+-----------+------------+------------+ Left   >1.0 [place]    .60                                 +-------+-----------+-----------+------------+------------+  Summary: Right: Resting right ankle-brachial index indicates noncompressible right lower extremity arteries. The right toe-brachial index is abnormal. ABIs are unreliable. Imaged the ATA and PTA at the level of the Ankle displays Monophasic Waveforms. Left: Resting left ankle-brachial  index indicates noncompressible left lower extremity arteries. The left toe-brachial index is abnormal. ABIs are unreliable. Imaging and Waveforms obtained throughout at the level of the Ankle in the Left ; Monophasic Waveforms seen.  *See table(s) above for measurements and observations.  Electronically signed by Hortencia Pilar MD on 09/04/2019 at 5:14:34 PM.    Final      Assessment/Plan 1. Chronic venous insufficiency  No surgery or intervention at this point in time.    I have reviewed my discussion with the patient regarding lymphedema and why it  causes symptoms.  Patient will continue wearing graduated compression stockings class 1 (20-30 mmHg) on a daily basis a prescription was given. The patient is reminded to put the stockings on first thing in the morning and removing them in the evening. The patient is instructed specifically not to sleep in the stockings.   In addition, behavioral modification throughout the day will be continued.  This will include frequent elevation (such as in a recliner), use of over the counter pain medications as needed and exercise such as walking.  I have reviewed systemic causes for chronic edema such as liver, kidney and cardiac etiologies and there does not appear to be any significant changes in these organ systems over the past year.  The patient is under the impression that these organ systems are all stable and unchanged.    The patient will continue aggressive use of the  lymph pump.  This will continue to improve the edema control and prevent sequela such as ulcers and infections.   The patient will follow-up with me on an annual basis.    2. Lymphedema  No surgery or intervention at this point in time.    I have reviewed my discussion with the patient regarding lymphedema and why it  causes symptoms.  Patient will continue wearing graduated compression stockings class 1 (20-30 mmHg) on a daily basis a prescription was given. The patient is  reminded to put the stockings on first thing in the morning and removing them in the evening. The patient is instructed specifically not to sleep in the stockings.   In addition, behavioral modification throughout the day will be continued.  This will include frequent elevation (such as in a recliner), use of over the counter pain medications as needed and exercise such as walking.  I  have reviewed systemic causes for chronic edema such as liver, kidney and cardiac etiologies and there does not appear to be any significant changes in these organ systems over the past year.  The patient is under the impression that these organ systems are all stable and unchanged.    The patient will continue aggressive use of the  lymph pump.  This will continue to improve the edema control and prevent sequela such as ulcers and infections.   The patient will follow-up with me on an annual basis.    3. Persistent atrial fibrillation (HCC) Continue antiarrhythmia medications as already ordered, these medications have been reviewed and there are no changes at this time.  Continue anticoagulation as ordered by Cardiology Service   4. Essential hypertension Continue antihypertensive medications as already ordered, these medications have been reviewed and there are no changes at this time.   5. Coronary artery disease, non-occlusive Continue cardiac and antihypertensive medications as already ordered and reviewed, no changes at this time.  Continue statin as ordered and reviewed, no changes at this time  Nitrates PRN for chest pain     Hortencia Pilar, MD  09/17/2019 11:32 AM

## 2019-09-18 ENCOUNTER — Other Ambulatory Visit: Payer: Self-pay

## 2019-09-18 ENCOUNTER — Ambulatory Visit (INDEPENDENT_AMBULATORY_CARE_PROVIDER_SITE_OTHER): Payer: Medicare HMO | Admitting: Nurse Practitioner

## 2019-09-18 ENCOUNTER — Encounter (INDEPENDENT_AMBULATORY_CARE_PROVIDER_SITE_OTHER): Payer: Self-pay

## 2019-09-18 ENCOUNTER — Telehealth (INDEPENDENT_AMBULATORY_CARE_PROVIDER_SITE_OTHER): Payer: Self-pay | Admitting: Vascular Surgery

## 2019-09-18 VITALS — BP 128/74 | HR 79 | Resp 16

## 2019-09-18 DIAGNOSIS — I890 Lymphedema, not elsewhere classified: Secondary | ICD-10-CM | POA: Diagnosis not present

## 2019-09-18 NOTE — Telephone Encounter (Signed)
Pt was put on the scheduled and Pt came in an was rewrapped  with calamine wraps.

## 2019-09-18 NOTE — Progress Notes (Signed)
History of Present Illness  There is no documented history at this time  Assessments & Plan   There are no diagnoses linked to this encounter.    Additional instructions  Subjective:  Patient presents with venous ulcer of the Bilateral lower extremity.    Procedure:  3 layer unna wrap was placed Bilateral lower extremity.   Plan:   Follow up in one week.  

## 2019-09-19 ENCOUNTER — Encounter (INDEPENDENT_AMBULATORY_CARE_PROVIDER_SITE_OTHER): Payer: Self-pay | Admitting: Nurse Practitioner

## 2019-09-24 ENCOUNTER — Ambulatory Visit: Payer: Medicare HMO | Admitting: Orthotics

## 2019-09-25 ENCOUNTER — Ambulatory Visit (INDEPENDENT_AMBULATORY_CARE_PROVIDER_SITE_OTHER): Payer: Medicare HMO | Admitting: Nurse Practitioner

## 2019-09-25 ENCOUNTER — Encounter (INDEPENDENT_AMBULATORY_CARE_PROVIDER_SITE_OTHER): Payer: Self-pay

## 2019-09-25 ENCOUNTER — Other Ambulatory Visit: Payer: Self-pay

## 2019-09-25 VITALS — BP 143/85 | HR 84 | Resp 16 | Wt 167.0 lb

## 2019-09-25 DIAGNOSIS — I890 Lymphedema, not elsewhere classified: Secondary | ICD-10-CM | POA: Diagnosis not present

## 2019-09-25 NOTE — Progress Notes (Signed)
History of Present Illness  There is no documented history at this time  Assessments & Plan   There are no diagnoses linked to this encounter.    Additional instructions  Subjective:  Patient presents with venous ulcer of the Bilateral lower extremity.    Procedure:  3 layer unna wrap was placed Bilateral lower extremity.   Plan:   Follow up in one week.  

## 2019-09-26 ENCOUNTER — Other Ambulatory Visit: Payer: Self-pay

## 2019-09-26 ENCOUNTER — Ambulatory Visit: Payer: Medicare HMO | Admitting: Orthotics

## 2019-09-26 ENCOUNTER — Telehealth: Payer: Self-pay | Admitting: Internal Medicine

## 2019-09-26 NOTE — Telephone Encounter (Signed)
Noted,  For your information  °

## 2019-09-26 NOTE — Telephone Encounter (Signed)
Jodi Reeves from [facility] called and stated the she is going to miss appointment today.

## 2019-09-27 LAB — URINALYSIS, ROUTINE W REFLEX MICROSCOPIC
Bilirubin, UA: NEGATIVE
Glucose, UA: NEGATIVE
Nitrite, UA: POSITIVE — AB
Specific Gravity, UA: 1.021 (ref 1.005–1.030)
Urobilinogen, Ur: 0.2 mg/dL (ref 0.2–1.0)
pH, UA: 6.5 (ref 5.0–7.5)

## 2019-09-27 LAB — CBC WITH DIFFERENTIAL/PLATELET
Basophils Absolute: 0 10*3/uL (ref 0.0–0.2)
Basos: 0 %
EOS (ABSOLUTE): 0.1 10*3/uL (ref 0.0–0.4)
Eos: 1 %
Hematocrit: 37.7 % (ref 34.0–46.6)
Hemoglobin: 12.6 g/dL (ref 11.1–15.9)
Immature Grans (Abs): 0 10*3/uL (ref 0.0–0.1)
Immature Granulocytes: 0 %
Lymphocytes Absolute: 1.5 10*3/uL (ref 0.7–3.1)
Lymphs: 21 %
MCH: 32.5 pg (ref 26.6–33.0)
MCHC: 33.4 g/dL (ref 31.5–35.7)
MCV: 97 fL (ref 79–97)
Monocytes Absolute: 0.5 10*3/uL (ref 0.1–0.9)
Monocytes: 7 %
Neutrophils Absolute: 5 10*3/uL (ref 1.4–7.0)
Neutrophils: 71 %
Platelets: 161 10*3/uL (ref 150–450)
RBC: 3.88 x10E6/uL (ref 3.77–5.28)
RDW: 12.8 % (ref 11.7–15.4)
WBC: 7.1 10*3/uL (ref 3.4–10.8)

## 2019-09-27 LAB — COMPREHENSIVE METABOLIC PANEL
ALT: 6 IU/L (ref 0–32)
AST: 21 IU/L (ref 0–40)
Albumin/Globulin Ratio: 1.4 (ref 1.2–2.2)
Albumin: 4.3 g/dL (ref 3.5–4.6)
Alkaline Phosphatase: 59 IU/L (ref 48–121)
BUN/Creatinine Ratio: 32 — ABNORMAL HIGH (ref 12–28)
BUN: 22 mg/dL (ref 10–36)
Bilirubin Total: 0.7 mg/dL (ref 0.0–1.2)
CO2: 25 mmol/L (ref 20–29)
Calcium: 9.6 mg/dL (ref 8.7–10.3)
Chloride: 101 mmol/L (ref 96–106)
Creatinine, Ser: 0.68 mg/dL (ref 0.57–1.00)
GFR calc Af Amer: 89 mL/min/{1.73_m2} (ref >59)
GFR calc non Af Amer: 77 mL/min/{1.73_m2} (ref >59)
Globulin, Total: 3 g/dL (ref 1.5–4.5)
Glucose: 155 mg/dL — ABNORMAL HIGH (ref 65–99)
Potassium: 4.9 mmol/L (ref 3.5–5.2)
Sodium: 140 mmol/L (ref 134–144)
Total Protein: 7.3 g/dL (ref 6.0–8.5)

## 2019-09-27 LAB — LIPID PANEL
Chol/HDL Ratio: 2.8 ratio (ref 0.0–4.4)
Cholesterol, Total: 185 mg/dL (ref 100–199)
HDL: 67 mg/dL (ref >39)
LDL Chol Calc (NIH): 107 mg/dL — ABNORMAL HIGH (ref 0–99)
Triglycerides: 60 mg/dL (ref 0–149)
VLDL Cholesterol Cal: 11 mg/dL (ref 5–40)

## 2019-09-27 LAB — MICROALBUMIN / CREATININE URINE RATIO
Creatinine, Urine: 103.3 mg/dL
Microalb/Creat Ratio: 84 mg/g creat — ABNORMAL HIGH (ref 0–29)
Microalbumin, Urine: 86.8 ug/mL

## 2019-09-27 LAB — MICROSCOPIC EXAMINATION
Casts: NONE SEEN /lpf
WBC, UA: >30 /hpf — AB (ref 0–5)

## 2019-09-27 LAB — HEMOGLOBIN A1C
Est. average glucose Bld gHb Est-mCnc: 151 mg/dL
Hgb A1c MFr Bld: 6.9 % — ABNORMAL HIGH (ref 4.8–5.6)

## 2019-09-27 LAB — TSH: TSH: 1.57 u[IU]/mL (ref 0.450–4.500)

## 2019-09-29 ENCOUNTER — Ambulatory Visit (INDEPENDENT_AMBULATORY_CARE_PROVIDER_SITE_OTHER): Payer: Medicare HMO | Admitting: Gastroenterology

## 2019-09-29 ENCOUNTER — Encounter (INDEPENDENT_AMBULATORY_CARE_PROVIDER_SITE_OTHER): Payer: Self-pay | Admitting: Nurse Practitioner

## 2019-09-29 ENCOUNTER — Encounter: Payer: Self-pay | Admitting: Gastroenterology

## 2019-09-29 ENCOUNTER — Other Ambulatory Visit: Payer: Self-pay

## 2019-09-29 VITALS — BP 155/85 | HR 93 | Temp 96.8°F | Ht 63.0 in | Wt 167.0 lb

## 2019-09-29 DIAGNOSIS — R197 Diarrhea, unspecified: Secondary | ICD-10-CM | POA: Diagnosis not present

## 2019-09-29 DIAGNOSIS — R4702 Dysphasia: Secondary | ICD-10-CM

## 2019-09-29 MED ORDER — DICYCLOMINE HCL 10 MG PO CAPS: 10.0000 mg | ORAL_CAPSULE | Freq: Three times a day (TID) | ORAL | 1 refills | Status: DC

## 2019-09-29 NOTE — Progress Notes (Signed)
Primary Care Physician: [name], Nino Glow, MD  Primary Gastroenterologist:  Dr. Lucilla Lame  Chief Complaint  Patient presents with  . Follow up diarrhea    HPI: Jodi Reeves is a 86 y.o. female who comes today for continued diarrhea.  The patient has had a colonoscopy in the past for her diarrhea by Dr. Candace Cruise at that time had normal random colon biopsies.  The patient was taking MiraLAX last time I saw her and she stopped that.  The patient reports now that she does not ever have hard stools but has a hard time passing her soft stools.  She states that more than 3 days a week she will have a morning that start with a solid bowel movement followed by multiple liquid bowel movements throughout the day.  The patient denies any unexplained weight loss fevers chills nausea or vomiting.  She also reports that she has been having trouble with swallowing and has had a stricture of the esophagus with dilation in the past.  Past Medical History:  Diagnosis Date  . Allergy   . Anxiety   . Balance disorder   . Chronic diastolic CHF (congestive heart failure) ([place])   . Colon polyps   . Coronary artery disease, non-occlusive    a. LHC 11/2003: 10% LAD stenosis, 20% LCx stenosis, and 40% RCA stenosis; b. nuc stress test 12/15: small region of mild ischemia in the apical region with WMA also noted in the apical region, EF 60%. She declined invasive evaluation at that time  . DM2 (diabetes mellitus, type 2) ([place])   . Fall    04/2017 see care[facility]where [facility] multiple fractures, brusing   . Falls frequently    h/o left shoudler/wrist fracture and left fingers numb  . GERD (gastroesophageal reflux disease)    esophageal web  . 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 History of chicken pox   . History of eating disorder   . HLD (hyperlipidemia)   . HTN (hypertension)   . Hypothyroidism   . IBS (irritable bowel syndrome)    diarrhea   . Incontinence of bowel   . Osteoporosis   .  Ovarian cancer ([place])   . Persistent atrial fibrillation ([place])    a. CHADS2VASc = > 8 (CHF, HTN, age x 2, DM, TIA x 2, female); b. on Xarelto  . SBO (small bowel obstruction) ([place])    1998  . TIA (transient ischemic attack)   . UTI (urinary tract infection)   . Venous insufficiency     Current Outpatient Medications  Medication Sig Dispense Refill  . acetaminophen (TYLENOL) 500 MG tablet Take 1,000 mg by mouth every 6 (six) hours as needed for moderate pain or headache.    . Ascorbic Acid (VITAMIN C) POWD Take 200 mg by mouth daily.     . bisoprolol (ZEBETA) 5 MG tablet Take 1 tablet (5 mg total) by mouth daily. 90 tablet 0  . co-enzyme Q-10 30 MG capsule Take 30 mg by mouth daily.     Marland Kitchen estradiol (ESTRACE) 0.1 MG/GM vaginal cream APPLY 0.5MG  (PEA SIZED AMOUNT) JUST INSIDE THE VAGINA WITH FINGER-TIP ON MONDAY, WEDNESDAY AND FRIDAY NIGHTS. 42.5 g 0  . FOLIC [id number]-I PO Take by mouth.    . furosemide (LASIX) 20 MG tablet Take 1 tablet (20 mg total) by mouth 2 (two) times daily. 190 tablet 3  . glucose blood (ONE TOUCH ULTRA TEST) test strip Use to test blood sugar once  daily E11.49 (Patient taking differently: Use to test blood sugar twice daily E11.49) 100 each 1  . levothyroxine (SYNTHROID, LEVOTHROID) 50 MCG tablet Take 50 mcg by mouth daily before breakfast.    . linagliptin (TRADJENTA) 5 MG TABS tablet Take by mouth.    . metFORMIN (GLUCOPHAGE-XR) 500 MG 24 hr tablet Take 1,000 mg by mouth 2 (two) times daily.    . Multiple Vitamin (CALCIUM COMPLEX PO) Take 1 tablet by mouth daily.    . NON FORMULARY Trivita Supplement B12 Takes qd. B6 qd, and folic acid    . Omega-3 1000 MG CAPS Take 2,000 mg by mouth daily.     . potassium chloride (KLOR-CON) 10 MEQ tablet Take 1 tablet (10 mEq total) by mouth 2 (two) times daily. 180 tablet 3  . psyllium (METAMUCIL) 58.6 % packet Take 1 packet by mouth 2 (two) times daily.     . rivaroxaban (XARELTO) 20 MG TABS tablet Take 1 tablet (20 mg total)  by mouth daily with supper. 30 tablet 6  . SANTYL ointment Apply 1 application topically daily. 30 g 1  . dicyclomine (BENTYL) 10 MG capsule Take 1 capsule (10 mg total) by mouth 4 (four) times daily -  before meals and at bedtime. 90 capsule 1  . losartan (COZAAR) 25 MG tablet Take 0.5 tablets (12.5 mg total) by mouth daily. 45 tablet 0   No current facility-administered medications for this visit.    Allergies as of 09/29/2019 - Review Complete 09/29/2019  Allergen Reaction Noted  . Amiodarone Other (See Comments) 05/10/2017  . Penicillins Anaphylaxis and Other (See Comments)   . Actos [pioglitazone]  08/21/2017  . Amaryl [glimepiride]  08/21/2017  . Atorvastatin Other (See Comments)   . Ciprofloxacin Other (See Comments) 09/17/2015  . Codeine Nausea And Vomiting   . Ezetimibe-simvastatin Other (See Comments)   . Fosamax [alendronate]  08/21/2017  . Lovastatin Other (See Comments)   . Macrobid [nitrofurantoin macrocrystal]  10/12/2017  . Protonix [pantoprazole sodium]  10/12/2017  . Rosiglitazone maleate Other (See Comments)   . Statins  05/10/2017  . Victoza [liraglutide]  10/12/2017  . Alendronate sodium Rash   . Bactrim [sulfamethoxazole-trimethoprim] Rash 09/17/2015  . Sulfa antibiotics Rash 10/12/2017    ROS:  General: Negative for anorexia, weight loss, fever, chills, fatigue, weakness. ENT: Negative for hoarseness, difficulty swallowing , nasal congestion. CV: Negative for chest pain, angina, palpitations, dyspnea on exertion, peripheral edema.  Respiratory: Negative for dyspnea at rest, dyspnea on exertion, cough, sputum, wheezing.  GI: See history of present illness. GU:  Negative for dysuria, hematuria, urinary incontinence, urinary frequency, nocturnal urination.  Endo: Negative for unusual weight change.    Physical Examination:   BP (!) 155/85   Pulse 93   Temp (!) 96.8 F (36 C) (Temporal)   Ht 5\' 3"  (1.6 m)   Wt 167 lb (75.8 kg)   BMI 29.58 kg/m    General: Well-nourished, well-developed in no acute distress.  Eyes: No icterus. Conjunctivae pink. Extremities: No lower extremity edema. No clubbing or deformities. Neuro: Alert and oriented x 3.  Grossly intact. Skin: Warm and dry, no jaundice.   Psych: Alert and cooperative, normal mood and affect.  Labs:    Imaging Studies: VAS [place] ABI WITH/WO TBI  Result Date: 09/04/2019 LOWER EXTREMITY DOPPLER STUDY Indications: Ulceration.  Performing Technologist: Almira Coaster RVS  Examination Guidelines: A complete evaluation includes at minimum, Doppler waveform signals and systolic blood pressure reading at the level of bilateral brachial,  anterior tibial, and posterior tibial arteries, when vessel segments are accessible. Bilateral testing is considered an integral part of a complete examination. Photoelectric Plethysmograph (PPG) waveforms and toe systolic pressure readings are included as required and additional duplex testing as needed. Limited examinations for reoccurring indications may be performed as noted.  ABI Findings: +---------+------------------+-----+----------+--------+ Right    Rt Pressure (mmHg)IndexWaveform  Comment  +---------+------------------+-----+----------+--------+ Brachial 149                                       +---------+------------------+-----+----------+--------+ ATA      231               1.46 monophasicNC       +---------+------------------+-----+----------+--------+ PTA      250               1.58 monophasicNC       +---------+------------------+-----+----------+--------+ Great Toe93                0.59 Abnormal           +---------+------------------+-----+----------+--------+ +---------+------------------+-----+----------+-------+ Left     Lt Pressure (mmHg)IndexWaveform  Comment +---------+------------------+-----+----------+-------+ Brachial 158                                       +---------+------------------+-----+----------+-------+ ATA      244               1.54 monophasicNC      +---------+------------------+-----+----------+-------+ PTA      244               1.54 monophasicNC      +---------+------------------+-----+----------+-------+ Great Toe95                0.60 Abnormal          +---------+------------------+-----+----------+-------+ +-------+-----------+-----------+------------+------------+ ABI/TBIToday's ABIToday's TBIPrevious ABIPrevious TBI +-------+-----------+-----------+------------+------------+ Right  >1.0 [place]    .8                                 +-------+-----------+-----------+------------+------------+ Left   >1.0 [place]    .60                                 +-------+-----------+-----------+------------+------------+  Summary: Right: Resting right ankle-brachial index indicates noncompressible right lower extremity arteries. The right toe-brachial index is abnormal. ABIs are unreliable. Imaged the ATA and PTA at the level of the Ankle displays Monophasic Waveforms. Left: Resting left ankle-brachial index indicates noncompressible left lower extremity arteries. The left toe-brachial index is abnormal. ABIs are unreliable. Imaging and Waveforms obtained throughout at the level of the Ankle in the Left ; Monophasic Waveforms seen.  *See table(s) above for measurements and observations.  Electronically signed by Hortencia Pilar MD on 09/04/2019 at 5:14:34 PM.    Final     Assessment and Plan:   Jodi Reeves is a 86 y.o. y/o female who comes in with a history of irritable bowel syndrome with alternating diarrhea and constipation.  The patient now reports that her constipation has resolved she just feels that she has a hard time passing the diarrhea at times.  The patient's main concern is her abdominal discomfort and bloating.  The patient has tried a lactose-free  diet without any help and did not think of any foods that are  making her symptoms any better or worse.  The patient will be started on dicyclomine 10 mg 3 times a day for her abdominal cramps and bloating and this may help with her diarrhea.  The patient has already started Metamucil once a day.  She has tried Imodium and reports that she took 15 mL and did not have a bowel movement for 3 days.  She has been told to try and decrease the dose of her Imodium to a level that can control the diarrhea without causing her constipation.  The patient will be set up for a barium swallow to look for a stricture due to her report of dysphagia.  If it does show stricture she may need a repeat EGD.  The patient has been explained the plan and agrees with it.     Lucilla Lame, MD. Marval Regal    Note: This dictation was prepared with Dragon dictation along with smaller phrase technology. Any transcriptional errors that result from this process are unintentional.

## 2019-09-30 ENCOUNTER — Ambulatory Visit
Admission: RE | Admit: 2019-09-30 | Discharge: 2019-09-30 | Disposition: A | Discharge status: Home / Self Care | Payer: Medicare HMO | Source: Ambulatory Visit | Attending: Gastroenterology | Admitting: Gastroenterology

## 2019-09-30 ENCOUNTER — Other Ambulatory Visit: Payer: Self-pay | Admitting: Gastroenterology

## 2019-09-30 ENCOUNTER — Other Ambulatory Visit: Payer: Self-pay

## 2019-09-30 DIAGNOSIS — R4702 Dysphasia: Secondary | ICD-10-CM | POA: Insufficient documentation

## 2019-10-01 ENCOUNTER — Telehealth: Payer: Self-pay

## 2019-10-01 ENCOUNTER — Other Ambulatory Visit: Payer: Self-pay | Admitting: Cardiovascular Disease

## 2019-10-01 NOTE — Telephone Encounter (Signed)
Pt's age 86, wt 75.8 kg, SCr 0.68, CrCl 65.8, last ov w/ TG 09/08/19.

## 2019-10-01 NOTE — Telephone Encounter (Signed)
Refill request

## 2019-10-01 NOTE — Telephone Encounter (Signed)
-----   Message from Lucilla Lame, MD sent at 10/01/2019  7:18 AM EDT ----- Let the patient know that the barium swallow showed no strictures or narrowing that would be corrected with a upper endoscopy and that her esophagus is wide open and did not show any abnormalities.

## 2019-10-01 NOTE — Telephone Encounter (Signed)
Pt notified of barium swallow results.  

## 2019-10-02 ENCOUNTER — Ambulatory Visit: Payer: Medicare HMO | Admitting: Podiatry

## 2019-10-02 ENCOUNTER — Ambulatory Visit (INDEPENDENT_AMBULATORY_CARE_PROVIDER_SITE_OTHER): Payer: Medicare HMO | Admitting: Nurse Practitioner

## 2019-10-02 ENCOUNTER — Encounter (INDEPENDENT_AMBULATORY_CARE_PROVIDER_SITE_OTHER): Payer: Self-pay

## 2019-10-02 ENCOUNTER — Other Ambulatory Visit: Payer: Self-pay

## 2019-10-02 VITALS — BP 129/82 | HR 82 | Resp 16

## 2019-10-02 DIAGNOSIS — L97512 Non-pressure chronic ulcer of other part of right foot with fat layer exposed: Secondary | ICD-10-CM | POA: Diagnosis not present

## 2019-10-02 DIAGNOSIS — L97929 Non-pressure chronic ulcer of unspecified part of left lower leg with unspecified severity: Secondary | ICD-10-CM

## 2019-10-02 DIAGNOSIS — I83019 Varicose veins of right lower extremity with ulcer of unspecified site: Secondary | ICD-10-CM | POA: Diagnosis not present

## 2019-10-02 DIAGNOSIS — E1140 Type 2 diabetes mellitus with diabetic neuropathy, unspecified: Secondary | ICD-10-CM | POA: Diagnosis not present

## 2019-10-02 DIAGNOSIS — L97919 Non-pressure chronic ulcer of unspecified part of right lower leg with unspecified severity: Secondary | ICD-10-CM

## 2019-10-02 DIAGNOSIS — E1149 Type 2 diabetes mellitus with other diabetic neurological complication: Secondary | ICD-10-CM | POA: Diagnosis not present

## 2019-10-02 DIAGNOSIS — I83029 Varicose veins of left lower extremity with ulcer of unspecified site: Secondary | ICD-10-CM | POA: Diagnosis not present

## 2019-10-02 NOTE — Progress Notes (Signed)
History of Present Illness  There is no documented history at this time  Assessments & Plan   There are no diagnoses linked to this encounter.    Additional instructions  Subjective:  Patient presents with venous ulcer of the Bilateral lower extremity.    Procedure:  3 layer unna wrap was placed Bilateral lower extremity.   Plan:   Follow up in one week.  

## 2019-10-03 ENCOUNTER — Encounter: Payer: Self-pay | Admitting: Podiatry

## 2019-10-03 NOTE — Progress Notes (Signed)
Subjective:  Patient ID: Jodi Reeves, female    DOB: Nov 23, 1927,  MRN: [id number]  Chief Complaint  Patient presents with  . Foot Pain    pt is here for a ulcer f/u of the right great toe, pt states that it is looking alot better, since the last time she was here, pt shows no signs of infection, pt also states that she has a hard time wrapping up her toe due to the ointment she is applying, pt is also waiting on diabetic shoes    86 y.o. female presents for wound care.  Patient presents with a follow-up of right hallux distal tip ulceration.  Patient states she is doing much better.  She does not have any acute complaints.  She has been doing Santyl wet-to-dry dressing.  She states that she is waiting to get her diabetic shoes.  She denies any other acute complaints. Review of Systems: Negative except as noted in the HPI. Denies N/V/F/Ch.  Past Medical History:  Diagnosis Date  . Allergy   . Anxiety   . Balance disorder   . Chronic diastolic CHF (congestive heart failure) ([place])   . Colon polyps   . Coronary artery disease, non-occlusive    a. LHC 11/2003: 10% LAD stenosis, 20% LCx stenosis, and 40% RCA stenosis; b. nuc stress test 12/15: small region of mild ischemia in the apical region with WMA also noted in the apical region, EF 60%. She declined invasive evaluation at that time  . DM2 (diabetes mellitus, type 2) ([place])   . Fall    04/2017 see care[facility]where [facility] multiple fractures, brusing   . Falls frequently    h/o left shoudler/wrist fracture and left fingers numb  . GERD (gastroesophageal reflux disease)    esophageal web  . 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 History of chicken pox   . History of eating disorder   . HLD (hyperlipidemia)   . HTN (hypertension)   . Hypothyroidism   . IBS (irritable bowel syndrome)    diarrhea   . Incontinence of bowel   . Osteoporosis   . Ovarian cancer ([place])   . Persistent atrial fibrillation ([place])    a. CHADS2VASc = >  8 (CHF, HTN, age x 2, DM, TIA x 2, female); b. on Xarelto  . SBO (small bowel obstruction) ([place])    1998  . TIA (transient ischemic attack)   . UTI (urinary tract infection)   . Venous insufficiency     Current Outpatient Medications:  .  acetaminophen (TYLENOL) 500 MG tablet, Take 1,000 mg by mouth every 6 (six) hours as needed for moderate pain or headache., Disp: , Rfl:  .  Ascorbic Acid (VITAMIN C) POWD, Take 200 mg by mouth daily. , Disp: , Rfl:  .  bisoprolol (ZEBETA) 5 MG tablet, Take 1 tablet (5 mg total) by mouth daily., Disp: 90 tablet, Rfl: 0 .  co-enzyme Q-10 30 MG capsule, Take 30 mg by mouth daily. , Disp: , Rfl:  .  dicyclomine (BENTYL) 10 MG capsule, Take 1 capsule (10 mg total) by mouth 4 (four) times daily -  before meals and at bedtime., Disp: 90 capsule, Rfl: 1 .  estradiol (ESTRACE) 0.1 MG/GM vaginal cream, APPLY 0.5MG  (PEA SIZED AMOUNT) JUST INSIDE THE VAGINA WITH FINGER-TIP ON MONDAY, WEDNESDAY AND FRIDAY NIGHTS., Disp: 98.9 g, Rfl: 0 .  FOLIC QJJH-[id number]-D40-C PO, Take by mouth., Disp: , Rfl:  .  furosemide (LASIX) 20 MG tablet, Take  1 tablet (20 mg total) by mouth 2 (two) times daily., Disp: 190 tablet, Rfl: 3 .  glucose blood (ONE TOUCH ULTRA TEST) test strip, Use to test blood sugar once daily E11.49 (Patient taking differently: Use to test blood sugar twice daily E11.49), Disp: 100 each, Rfl: 1 .  levothyroxine (SYNTHROID, LEVOTHROID) 50 MCG tablet, Take 50 mcg by mouth daily before breakfast., Disp: , Rfl:  .  linagliptin (TRADJENTA) 5 MG TABS tablet, Take by mouth., Disp: , Rfl:  .  metFORMIN (GLUCOPHAGE-XR) 500 MG 24 hr tablet, Take 1,000 mg by mouth 2 (two) times daily., Disp: , Rfl:  .  Multiple Vitamin (CALCIUM COMPLEX PO), Take 1 tablet by mouth daily., Disp: , Rfl:  .  NON FORMULARY, Trivita Supplement B12 Takes qd. B6 qd, and folic acid, Disp: , Rfl:  .  Olopatadine HCl 0.2 % SOLN, 1 drop once daily, Disp: , Rfl:  .  Omega-3 1000 MG CAPS, Take 2,000 mg by  mouth daily. , Disp: , Rfl:  .  potassium chloride (KLOR-CON) 10 MEQ tablet, Take 1 tablet (10 mEq total) by mouth 2 (two) times daily., Disp: 180 tablet, Rfl: 3 .  psyllium (METAMUCIL) 58.6 % packet, Take 1 packet by mouth 2 (two) times daily. , Disp: , Rfl:  .  SANTYL ointment, Apply 1 application topically daily., Disp: 30 g, Rfl: 1 .  XARELTO 20 MG TABS tablet, Take 1 tablet (20 mg total) by mouth daily with supper., Disp: 30 tablet, Rfl: 6 .  losartan (COZAAR) 25 MG tablet, Take 0.5 tablets (12.5 mg total) by mouth daily., Disp: 45 tablet, Rfl: 0  Social History   Tobacco Use  Smoking Status Never Smoker  Smokeless Tobacco Never Used    Allergies  Allergen Reactions  . Amiodarone Other (See Comments)    Severe Thyroid issues   . Penicillins Anaphylaxis and Other (See Comments)    Has patient had a PCN reaction causing immediate rash, facial/tongue/throat swelling, SOB or lightheadedness with hypotension: Yes Has patient had a PCN reaction causing severe rash involving mucus membranes or skin necrosis: No Has patient had a PCN reaction that required hospitalization: No Has patient had a PCN reaction occurring within the last 10 years: No If all of the above answers are "NO", then may proceed with Cephalosporin use.   . Actos [Pioglitazone]     Not effective   . Amaryl [Glimepiride]     Not effective    . Atorvastatin Other (See Comments)    Muscle aches & All statins per Patient  . Ciprofloxacin Other (See Comments)    High blood pressure    . Codeine Nausea And Vomiting  . Ezetimibe-Simvastatin Other (See Comments)    Muscle aches, nausea, back pain   . Fosamax [Alendronate]     dysphagia  . Lovastatin Other (See Comments)    Myalgias  . Macrobid [Nitrofurantoin Macrocrystal]     Severe Itching, rash   . Protonix [Pantoprazole Sodium]     Esophageal problems    . Rosiglitazone Maleate Other (See Comments)    Edema  . Statins     Muscle and joint aches to all  statins  . Victoza [Liraglutide]     Nausea   . Alendronate Sodium Rash    Dysphagia and ulceration   . Bactrim [Sulfamethoxazole-Trimethoprim] Rash    itching  . Sulfa Antibiotics Rash    Itching    Objective:   There were no vitals filed for this visit. There is no  height or weight on file to calculate BMI. Constitutional Well developed. Well nourished.  Vascular Dorsalis pedis pulses palpable bilaterally. Posterior tibial pulses palpable bilaterally. Capillary refill normal to all digits.  No cyanosis or clubbing noted. Pedal hair growth normal.  Neurologic Normal speech. Oriented to person, place, and time. Protective sensation absent  Dermatologic Wound Location: Right hallux ulceration completely resolved.  The skin has completely epithelialized. No further wound noted.  Thickened elongated dystrophic painful discolored toenails x10  Orthopedic:  Left second digit hammertoe contracture semiflexible in nature.  Mild pain on palpation.   Radiographs: None Assessment:   1. Diabetic neuropathy with neurologic complication ([place])   2. Chronic ulcer of right great toe with fat layer exposed ([place])    Plan:  Patient was evaluated and treated and all questions answered.  Ulcer right hallux distal tip ulceration~decreasing -Resolved completely with Santyl wet-to-dry dressing changes.  The skin has completely reepithelialized.  Upon debridement no further wound was noted. -I have asked her to continue wearing surgical shoe for now until she is able to transition her diabetic shoes. -She was casted for diabetic shoes.  Were awaiting to get them. -I will see her back now for routine foot care as needed.  Boneta Lucks, DPM    No follow-ups on file.   No follow-ups on file.

## 2019-10-06 ENCOUNTER — Encounter (INDEPENDENT_AMBULATORY_CARE_PROVIDER_SITE_OTHER): Payer: Self-pay | Admitting: Nurse Practitioner

## 2019-10-09 ENCOUNTER — Other Ambulatory Visit: Payer: Self-pay

## 2019-10-09 ENCOUNTER — Encounter (INDEPENDENT_AMBULATORY_CARE_PROVIDER_SITE_OTHER): Payer: Self-pay

## 2019-10-09 ENCOUNTER — Ambulatory Visit (INDEPENDENT_AMBULATORY_CARE_PROVIDER_SITE_OTHER): Payer: Medicare HMO | Admitting: Nurse Practitioner

## 2019-10-09 VITALS — BP 152/83 | HR 76 | Resp 16 | Wt 167.6 lb

## 2019-10-09 DIAGNOSIS — L97919 Non-pressure chronic ulcer of unspecified part of right lower leg with unspecified severity: Secondary | ICD-10-CM

## 2019-10-09 DIAGNOSIS — I83019 Varicose veins of right lower extremity with ulcer of unspecified site: Secondary | ICD-10-CM | POA: Diagnosis not present

## 2019-10-09 DIAGNOSIS — L97929 Non-pressure chronic ulcer of unspecified part of left lower leg with unspecified severity: Secondary | ICD-10-CM

## 2019-10-09 DIAGNOSIS — I83029 Varicose veins of left lower extremity with ulcer of unspecified site: Secondary | ICD-10-CM | POA: Diagnosis not present

## 2019-10-09 NOTE — Progress Notes (Signed)
History of Present Illness  There is no documented history at this time  Assessments & Plan   There are no diagnoses linked to this encounter.    Additional instructions  Subjective:  Patient presents with venous ulcer of the Bilateral lower extremity.    Procedure:  3 layer unna wrap was placed Bilateral lower extremity.   Plan:   Follow up in one week.  

## 2019-10-14 ENCOUNTER — Encounter (INDEPENDENT_AMBULATORY_CARE_PROVIDER_SITE_OTHER): Payer: Medicare HMO

## 2019-10-16 ENCOUNTER — Ambulatory Visit (INDEPENDENT_AMBULATORY_CARE_PROVIDER_SITE_OTHER): Payer: Medicare HMO | Admitting: Nurse Practitioner

## 2019-10-16 ENCOUNTER — Encounter (INDEPENDENT_AMBULATORY_CARE_PROVIDER_SITE_OTHER): Payer: Self-pay

## 2019-10-16 ENCOUNTER — Other Ambulatory Visit: Payer: Self-pay

## 2019-10-16 VITALS — BP 149/88 | HR 102 | Resp 16 | Wt 168.0 lb

## 2019-10-16 DIAGNOSIS — L97929 Non-pressure chronic ulcer of unspecified part of left lower leg with unspecified severity: Secondary | ICD-10-CM | POA: Diagnosis not present

## 2019-10-16 DIAGNOSIS — L97919 Non-pressure chronic ulcer of unspecified part of right lower leg with unspecified severity: Secondary | ICD-10-CM | POA: Diagnosis not present

## 2019-10-16 DIAGNOSIS — I83019 Varicose veins of right lower extremity with ulcer of unspecified site: Secondary | ICD-10-CM | POA: Diagnosis not present

## 2019-10-16 DIAGNOSIS — I83029 Varicose veins of left lower extremity with ulcer of unspecified site: Secondary | ICD-10-CM

## 2019-10-16 NOTE — Progress Notes (Signed)
History of Present Illness  There is no documented history at this time  Assessments & Plan   There are no diagnoses linked to this encounter.    Additional instructions  Subjective:  Patient presents with venous ulcer of the Bilateral lower extremity.    Procedure:  3 layer unna wrap was placed Bilateral lower extremity.   Plan:   Follow up in one week.  

## 2019-10-20 ENCOUNTER — Ambulatory Visit (INDEPENDENT_AMBULATORY_CARE_PROVIDER_SITE_OTHER): Payer: Medicare HMO | Admitting: Vascular Surgery

## 2019-10-23 ENCOUNTER — Other Ambulatory Visit: Payer: Self-pay

## 2019-10-23 ENCOUNTER — Ambulatory Visit (INDEPENDENT_AMBULATORY_CARE_PROVIDER_SITE_OTHER): Payer: Medicare HMO | Admitting: Vascular Surgery

## 2019-10-23 ENCOUNTER — Encounter (INDEPENDENT_AMBULATORY_CARE_PROVIDER_SITE_OTHER): Payer: Self-pay

## 2019-10-23 VITALS — BP 130/81 | HR 74 | Resp 16

## 2019-10-23 DIAGNOSIS — L97919 Non-pressure chronic ulcer of unspecified part of right lower leg with unspecified severity: Secondary | ICD-10-CM

## 2019-10-23 DIAGNOSIS — I83019 Varicose veins of right lower extremity with ulcer of unspecified site: Secondary | ICD-10-CM

## 2019-10-23 DIAGNOSIS — L97929 Non-pressure chronic ulcer of unspecified part of left lower leg with unspecified severity: Secondary | ICD-10-CM | POA: Diagnosis not present

## 2019-10-23 DIAGNOSIS — I83029 Varicose veins of left lower extremity with ulcer of unspecified site: Secondary | ICD-10-CM

## 2019-10-23 NOTE — Progress Notes (Signed)
History of Present Illness  There is no documented history at this time  Assessments & Plan   There are no diagnoses linked to this encounter.    Additional instructions  Subjective:  Patient presents with venous ulcer of the Bilateral lower extremity.    Procedure:  3 layer unna wrap was placed Bilateral lower extremity.   Plan:   Follow up in one week.  

## 2019-10-26 ENCOUNTER — Encounter (INDEPENDENT_AMBULATORY_CARE_PROVIDER_SITE_OTHER): Payer: Self-pay | Admitting: Vascular Surgery

## 2019-10-30 ENCOUNTER — Ambulatory Visit (INDEPENDENT_AMBULATORY_CARE_PROVIDER_SITE_OTHER): Payer: Medicare HMO | Admitting: Vascular Surgery

## 2019-10-30 ENCOUNTER — Encounter (INDEPENDENT_AMBULATORY_CARE_PROVIDER_SITE_OTHER): Payer: Self-pay | Admitting: Vascular Surgery

## 2019-10-30 ENCOUNTER — Other Ambulatory Visit: Payer: Self-pay

## 2019-10-30 VITALS — BP 151/86 | HR 76 | Resp 16 | Wt 169.0 lb

## 2019-10-30 DIAGNOSIS — I890 Lymphedema, not elsewhere classified: Secondary | ICD-10-CM | POA: Diagnosis not present

## 2019-10-30 DIAGNOSIS — I739 Peripheral vascular disease, unspecified: Secondary | ICD-10-CM

## 2019-10-30 DIAGNOSIS — I872 Venous insufficiency (chronic) (peripheral): Secondary | ICD-10-CM | POA: Diagnosis not present

## 2019-10-30 DIAGNOSIS — I10 Essential (primary) hypertension: Secondary | ICD-10-CM

## 2019-10-30 DIAGNOSIS — I4891 Unspecified atrial fibrillation: Secondary | ICD-10-CM | POA: Diagnosis not present

## 2019-10-30 NOTE — Progress Notes (Signed)
MRN : [id number]  Jodi Reeves is a 86 y.o. (05-Mar-1928) female who presents with chief complaint of  Chief Complaint  Patient presents with  . Follow-up    ultrasound follow up  .  History of Present Illness:   The patient returns to the office for followup evaluation regarding leg swelling.  The swelling has persisted and the pain associated with swelling continues. There have not been any interval development of a ulcerations or wounds.  Since the previous visit the patient has been wearing graduated compression stockings and has noted little if any improvement in the lymphedema. The patient has been using compression routinely morning until night.  The patient also states elevation during the day and exercise is being done too.   Current Meds  Medication Sig  . acetaminophen (TYLENOL) 500 MG tablet Take 1,000 mg by mouth every 6 (six) hours as needed for moderate pain or headache.  . Ascorbic Acid (VITAMIN C) POWD Take 200 mg by mouth daily.   . bisoprolol (ZEBETA) 5 MG tablet Take 1 tablet (5 mg total) by mouth daily.  Marland Kitchen co-enzyme Q-10 30 MG capsule Take 30 mg by mouth daily.   Marland Kitchen dicyclomine (BENTYL) 10 MG capsule Take 1 capsule (10 mg total) by mouth 4 (four) times daily -  before meals and at bedtime.  Marland Kitchen estradiol (ESTRACE) 0.1 MG/GM vaginal cream APPLY 0.5MG  (PEA SIZED AMOUNT) JUST INSIDE THE VAGINA WITH FINGER-TIP ON MONDAY, WEDNESDAY AND FRIDAY NIGHTS.  Marland Kitchen FOLIC AQTM-A2-Q33-H PO Take by mouth.  . furosemide (LASIX) 20 MG tablet Take 1 tablet (20 mg total) by mouth 2 (two) times daily.  Marland Kitchen glucose blood (ONE TOUCH ULTRA TEST) test strip Use to test blood sugar once daily E11.49 (Patient taking differently: Use to test blood sugar twice daily E11.49)  . levothyroxine (SYNTHROID, LEVOTHROID) 50 MCG tablet Take 50 mcg by mouth daily before breakfast.  . linagliptin (TRADJENTA) 5 MG TABS tablet Take by mouth.  . metFORMIN (GLUCOPHAGE-XR) 500 MG 24 hr tablet Take 500 mg by  mouth 2 (two) times daily.   . Multiple Vitamin (CALCIUM COMPLEX PO) Take 1 tablet by mouth daily.  . NON FORMULARY [place] Supplement B12 Takes qd. B6 qd, and folic acid  . Olopatadine HCl 0.2 % SOLN 1 drop once daily  . Omega-3 1000 MG CAPS Take 2,000 mg by mouth daily.   . potassium chloride (KLOR-CON) 10 MEQ tablet Take 1 tablet (10 mEq total) by mouth 2 (two) times daily.  . psyllium (METAMUCIL) 58.6 % packet Take 1 packet by mouth 2 (two) times daily.   Marland Kitchen SANTYL ointment Apply 1 application topically daily.  Alveda Reasons 20 MG TABS tablet Take 1 tablet (20 mg total) by mouth daily with supper.    Past Medical History:  Diagnosis Date  . Allergy   . Anxiety   . Balance disorder   . Chronic diastolic CHF (congestive heart failure) ([place])   . Colon polyps   . Coronary artery disease, non-occlusive    a. LHC 11/2003: 10% LAD stenosis, 20% LCx stenosis, and 40% RCA stenosis; b. nuc stress test 12/15: small region of mild ischemia in the apical region with WMA also noted in the apical region, EF 60%. She declined invasive evaluation at that time  . DM2 (diabetes mellitus, type 2) ([place])   . Fall    04/2017 see careeverywhere [facility] multiple fractures, brusing   . Falls frequently    h/o left shoudler/wrist fracture and left fingers  numb  . GERD (gastroesophageal reflux disease)    esophageal web  . 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 History of chicken pox   . History of eating disorder   . HLD (hyperlipidemia)   . HTN (hypertension)   . Hypothyroidism   . IBS (irritable bowel syndrome)    diarrhea   . Incontinence of bowel   . Osteoporosis   . Ovarian cancer ([place])   . Persistent atrial fibrillation ([place])    a. CHADS2VASc = > 8 (CHF, HTN, age x 2, DM, TIA x 2, female); b. on Xarelto  . SBO (small bowel obstruction) ([place])    1998  . TIA (transient ischemic attack)   . UTI (urinary tract infection)   . Venous insufficiency     Past Surgical History:  Procedure  Laterality Date  . 2nd look laparotomy  1982  . ABDOMINAL HYSTERECTOMY     for ovarian cancer s/p hysterectomy total 1976 and exp lap 1980  . APPENDECTOMY    . CARDIAC CATHETERIZATION  8/05   neg; a fib found   . CARPAL TUNNEL RELEASE Left 03/06/2018   Procedure: CARPAL TUNNEL RELEASE ENDOSCOPIC;  Surgeon: Corky Mull, MD;  Location: [facility];  Service: Orthopedics;  Laterality: Left;  diabetic - oral meds  . CARPAL TUNNEL RELEASE     left CTS Dr.Poggi  . cataract OD  2002  . cataract surgery  5/07   R  . CHOLECYSTECTOMY  1986  . DEXA  9/04 and 1/02  . EGD/dilation/colon  1/06  . ESOPHAGOGASTRODUODENOSCOPY (EGD) WITH PROPOFOL N/A 05/16/2016   Procedure: ESOPHAGOGASTRODUODENOSCOPY (EGD) WITH PROPOFOL with dilation;  Surgeon: Lucilla Lame, MD;  Location: [facility];  Service: Endoscopy;  Laterality: N/A;  . FEMORAL HERNIA REPAIR  1958   R  . fracture L elbow/wrist  2000  . intussception/obstruction  12/98  . JOINT REPLACEMENT     left shoulder  . kidney stone x2  1991  . laminectomy L4-5  1971  . LUMBAR LAMINECTOMY     1970 ruptured disc   . MOUTH SURGERY    . myoview stress  8/05   syncope (-)  . REVERSE SHOULDER ARTHROPLASTY Left 10/06/2014   Procedure: REVERSE SHOULDER ARTHROPLASTY;  Surgeon: Corky Mull, MD;  Location: [facility];  Service: Orthopedics;  Laterality: Left;  . stillbirth  1969  . TOTAL ABDOMINAL HYSTERECTOMY W/ BILATERAL SALPINGOOPHORECTOMY  1980   ovarian cancer  . WRIST FRACTURE SURGERY  11/05   R    Social History Social History   Tobacco Use  . Smoking status: Never Smoker  . Smokeless tobacco: Never Used  Vaping Use  . Vaping Use: Never used  Substance Use Topics  . Alcohol use: No    Comment: may have occasional glass of wine  . Drug use: No    Family History Family History  Problem Relation Age of Onset  . Early death Father   . Diabetes Mother   . Heart disease Mother   . Arthritis Brother   . Cancer Brother   .  Depression Brother   . Hearing loss Brother   . Arthritis Brother   . Cancer Brother        colon  . Hearing loss Brother   . Cancer Brother   . Diabetes Brother   . Hearing loss Brother   . Heart disease Brother   . Hyperlipidemia Brother   . Heart disease Sister   . Hypertension  Sister   . Stroke Sister   . Hearing loss Daughter   . Hypertension Daughter   . Diabetes Paternal Grandfather   . Hearing loss Sister   . Heart disease Sister   . Arthritis Sister   . Depression Sister   . Hearing loss Sister   . Heart disease Sister   . COPD Brother   . Early death Brother   . Early death Brother     Allergies  Allergen Reactions  . Amiodarone Other (See Comments)    Severe Thyroid issues   . Penicillins Anaphylaxis and Other (See Comments)    Has patient had a PCN reaction causing immediate rash, facial/tongue/throat swelling, SOB or lightheadedness with hypotension: Yes Has patient had a PCN reaction causing severe rash involving mucus membranes or skin necrosis: No Has patient had a PCN reaction that required hospitalization: No Has patient had a PCN reaction occurring within the last 10 years: No If all of the above answers are "NO", then may proceed with Cephalosporin use.   . Actos [Pioglitazone]     Not effective   . Amaryl [Glimepiride]     Not effective    . Atorvastatin Other (See Comments)    Muscle aches & All statins per Patient  . Ciprofloxacin Other (See Comments)    High blood pressure    . Codeine Nausea And Vomiting  . Ezetimibe-Simvastatin Other (See Comments)    Muscle aches, nausea, back pain   . Fosamax [Alendronate]     dysphagia  . Lovastatin Other (See Comments)    Myalgias  . Macrobid [Nitrofurantoin Macrocrystal]     Severe Itching, rash   . Protonix [Pantoprazole Sodium]     Esophageal problems    . Rosiglitazone Maleate Other (See Comments)    Edema  . Statins     Muscle and joint aches to all statins  . Victoza [Liraglutide]       Nausea   . Alendronate Sodium Rash    Dysphagia and ulceration   . Bactrim [Sulfamethoxazole-Trimethoprim] Rash    itching  . Sulfa Antibiotics Rash    Itching      REVIEW OF SYSTEMS (Negative unless checked)  Constitutional: [] Weight loss  [] Fever  [] Chills Cardiac: [] Chest pain   [] Chest pressure   [] Palpitations   [] Shortness of breath when laying flat   [] Shortness of breath with exertion. Vascular:  [] Pain in legs with walking   [] Pain in legs at rest  [] History of DVT   [] Phlebitis   [x] Swelling in legs   [] Varicose veins   [] Non-healing ulcers Pulmonary:   [] Uses home oxygen   [] Productive cough   [] Hemoptysis   [] Wheeze  [] COPD   [] Asthma Neurologic:  [] Dizziness   [] Seizures   [] History of stroke   [] History of TIA  [] Aphasia   [] Vissual changes   [] Weakness or numbness in arm   [] Weakness or numbness in leg Musculoskeletal:   [] Joint swelling   [x] Joint pain   [] Low back pain Hematologic:  [] Easy bruising  [] Easy bleeding   [] Hypercoagulable state   [] Anemic Gastrointestinal:  [] Diarrhea   [] Vomiting  [] Gastroesophageal reflux/heartburn   [] Difficulty swallowing. Genitourinary:  [] Chronic kidney disease   [] Difficult urination  [] Frequent urination   [] Blood in urine Skin:  [] Rashes   [] Ulcers  Psychological:  [] History of anxiety   []  History of major depression.  Physical Examination  Vitals:   10/30/19 1637  BP: (!) 151/86  Pulse: 76  Resp: 16  Weight: 169 lb (76.7 kg)  Body mass index is 29.94 kg/m. Gen: WD/WN, NAD Head: [place]/AT, No temporalis wasting.  Ear/Nose/Throat: Hearing grossly intact, nares w/o erythema or drainage Eyes: PER, EOMI, sclera nonicteric.  Neck: Supple, no large masses.   Pulmonary:  Good air movement, no audible wheezing bilaterally, no use of accessory muscles.  Cardiac: RRR, no JVD Vascular: scattered varicosities present bilaterally.  Mild venous stasis changes to the legs bilaterally.  2+ soft pitting edema Vessel Right Left   Radial Palpable Palpable  Gastrointestinal: Non-distended. No guarding/no peritoneal signs.  Musculoskeletal: M/S 5/5 throughout.  No deformity or atrophy.  Neurologic: CN 2-12 intact. Symmetrical.  Speech is fluent. Motor exam as listed above. Psychiatric: Judgment intact, Mood & affect appropriate for pt's clinical situation. Dermatologic: No rashes or ulcers noted.  No changes consistent with cellulitis. Lymph : No lichenification or skin changes of chronic lymphedema.  CBC Lab Results  Component Value Date   WBC 7.1 09/26/2019   HGB 12.6 09/26/2019   HCT 37.7 09/26/2019   MCV 97 09/26/2019   PLT 161 09/26/2019    BMET    Component Value Date/Time   NA 140 09/26/2019 1011   K 4.9 09/26/2019 1011   CL 101 09/26/2019 1011   CO2 25 09/26/2019 1011   GLUCOSE 155 (H) 09/26/2019 1011   GLUCOSE 156 (H) 11/18/2018 1026   BUN 22 09/26/2019 1011   CREATININE 0.68 09/26/2019 1011   CALCIUM 9.6 09/26/2019 1011   GFRNONAA 77 09/26/2019 1011   GFRAA 89 09/26/2019 1011   CrCl cannot be calculated (Patient's most recent lab result is older than the maximum 21 days allowed.).  COAG Lab Results  Component Value Date   INR 1.17 10/06/2014   INR 1.70 10/01/2014   INR 2.0 03/26/2012    Radiology No results found.   Assessment/Plan 1. Chronic venous insufficiency Recommend:  No surgery or intervention at this point in time.    I have reviewed my previous discussion with the patient regarding swelling and why it causes symptoms.  Patient will continue wearing graduated compression stockings class 1 (20-30 mmHg) on a daily basis. The patient will  beginning wearing the stockings first thing in the morning and removing them in the evening. The patient is instructed specifically not to sleep in the stockings.    In addition, behavioral modification including several periods of elevation of the lower extremities during the day will be continued.  This was reviewed with the patient  during the initial visit.  The patient will also continue routine exercise, especially walking on a daily basis as was discussed during the initial visit.    Despite conservative treatments including graduated compression therapy class 1 and behavioral modification including exercise and elevation the patient  has not obtained adequate control of the lymphedema.  The patient still has stage 3 lymphedema and therefore, I believe that a lymph pump should be added to improve the control of the patient's lymphedema.  Additionally, a lymph pump is warranted because it will reduce the risk of cellulitis and ulceration in the future.  Patient should follow-up in six months    2. Lymphedema Recommend:  No surgery or intervention at this point in time.    I have reviewed my previous discussion with the patient regarding swelling and why it causes symptoms.  Patient will continue wearing graduated compression stockings class 1 (20-30 mmHg) on a daily basis. The patient will  beginning wearing the stockings first thing in the morning and removing them in the evening.  The patient is instructed specifically not to sleep in the stockings.    In addition, behavioral modification including several periods of elevation of the lower extremities during the day will be continued.  This was reviewed with the patient during the initial visit.  The patient will also continue routine exercise, especially walking on a daily basis as was discussed during the initial visit.    Despite conservative treatments including graduated compression therapy class 1 and behavioral modification including exercise and elevation the patient  has not obtained adequate control of the lymphedema.  The patient still has stage 3 lymphedema and therefore, I believe that a lymph pump should be added to improve the control of the patient's lymphedema.  Additionally, a lymph pump is warranted because it will reduce the risk of cellulitis and  ulceration in the future.  Patient should follow-up in six months    3. PAD (peripheral artery disease) (HCC)  Recommend:  The patient has evidence of atherosclerosis of the lower extremities with claudication.  The patient does not voice lifestyle limiting changes at this point in time.  Noninvasive studies do not suggest clinically significant change.  No invasive studies, angiography or surgery at this time The patient should continue walking and begin a more formal exercise program.  The patient should continue antiplatelet therapy and aggressive treatment of the lipid abnormalities  No changes in the patient's medications at this time  The patient should continue wearing graduated compression socks 10-15 mmHg strength to control the mild edema.    4. Atrial fibrillation, unspecified type ([place]) Continue antiarrhythmia medications as already ordered, these medications have been reviewed and there are no changes at this time.  Continue anticoagulation as ordered by Cardiology Service   5. Essential hypertension Continue antihypertensive medications as already ordered, these medications have been reviewed and there are no changes at this time.     Hortencia Pilar, MD  10/30/2019 4:45 PM

## 2019-11-02 ENCOUNTER — Encounter (INDEPENDENT_AMBULATORY_CARE_PROVIDER_SITE_OTHER): Payer: Self-pay | Admitting: Vascular Surgery

## 2019-11-05 ENCOUNTER — Ambulatory Visit: Payer: Medicare HMO | Admitting: Orthotics

## 2019-11-26 ENCOUNTER — Ambulatory Visit: Payer: Medicare HMO | Admitting: Orthotics

## 2019-12-03 ENCOUNTER — Ambulatory Visit (INDEPENDENT_AMBULATORY_CARE_PROVIDER_SITE_OTHER): Payer: Medicare HMO | Admitting: Orthotics

## 2019-12-03 ENCOUNTER — Other Ambulatory Visit: Payer: Self-pay

## 2019-12-03 DIAGNOSIS — E1149 Type 2 diabetes mellitus with other diabetic neurological complication: Secondary | ICD-10-CM

## 2019-12-03 DIAGNOSIS — L97512 Non-pressure chronic ulcer of other part of right foot with fat layer exposed: Secondary | ICD-10-CM

## 2019-12-03 DIAGNOSIS — M2042 Other hammer toe(s) (acquired), left foot: Secondary | ICD-10-CM

## 2019-12-03 DIAGNOSIS — M205X1 Other deformities of toe(s) (acquired), right foot: Secondary | ICD-10-CM | POA: Diagnosis not present

## 2019-12-03 DIAGNOSIS — M205X2 Other deformities of toe(s) (acquired), left foot: Secondary | ICD-10-CM | POA: Diagnosis not present

## 2019-12-03 NOTE — Progress Notes (Signed)

## 2019-12-05 ENCOUNTER — Other Ambulatory Visit: Payer: Self-pay | Admitting: Urology

## 2019-12-22 ENCOUNTER — Ambulatory Visit (INDEPENDENT_AMBULATORY_CARE_PROVIDER_SITE_OTHER): Payer: Medicare HMO | Admitting: Vascular Surgery

## 2019-12-22 ENCOUNTER — Encounter (INDEPENDENT_AMBULATORY_CARE_PROVIDER_SITE_OTHER): Payer: Self-pay | Admitting: Vascular Surgery

## 2019-12-22 ENCOUNTER — Other Ambulatory Visit: Payer: Self-pay

## 2019-12-22 VITALS — BP 154/84 | HR 101 | Ht 63.0 in | Wt 172.0 lb

## 2019-12-22 DIAGNOSIS — I739 Peripheral vascular disease, unspecified: Secondary | ICD-10-CM

## 2019-12-22 DIAGNOSIS — I2510 Atherosclerotic heart disease of native coronary artery without angina pectoris: Secondary | ICD-10-CM | POA: Diagnosis not present

## 2019-12-22 DIAGNOSIS — I872 Venous insufficiency (chronic) (peripheral): Secondary | ICD-10-CM | POA: Diagnosis not present

## 2019-12-22 DIAGNOSIS — I4819 Other persistent atrial fibrillation: Secondary | ICD-10-CM

## 2019-12-22 DIAGNOSIS — E1142 Type 2 diabetes mellitus with diabetic polyneuropathy: Secondary | ICD-10-CM

## 2019-12-22 DIAGNOSIS — I890 Lymphedema, not elsewhere classified: Secondary | ICD-10-CM

## 2019-12-22 NOTE — Progress Notes (Signed)
MRN : [id number]  Jodi Reeves is a 86 y.o. (09-04-1927) female who presents with chief complaint of  Chief Complaint  Patient presents with  . Follow-up    pt req leg swelling  .  History of Present Illness:   The patient returns to the office sooner than expected for followup evaluation regarding leg swelling.  The swelling has persisted in fact it seems to gotten worse and the pain associated with swelling continues. There have not been any interval development of a ulcerations or wounds..  She notes that she has gotten a lymphedema pump and has been using it twice a day every day but feels that her swelling has actually increased by using the lymphedema pump.  Once she had the pump she stopped wearing her compression wraps.  She notes that the stockings do not help her at all in fact she attributes the stockings to causing ulcers.    Since the previous visit the patient has not been wearing graduated compression stockings and has noted little if any improvement in the lymphedema.  She also notes that she is elevating her feet all the time.  The patient also states elevation during the day and exercise is being done too.   Current Meds  Medication Sig  . acetaminophen (TYLENOL) 500 MG tablet Take 1,000 mg by mouth every 6 (six) hours as needed for moderate pain or headache.  . Ascorbic Acid (VITAMIN C) POWD Take 200 mg by mouth daily.   . bisoprolol (ZEBETA) 5 MG tablet Take 1 tablet (5 mg total) by mouth daily.  Marland Kitchen co-enzyme Q-10 30 MG capsule Take 30 mg by mouth daily.   Marland Kitchen dicyclomine (BENTYL) 10 MG capsule Take 1 capsule (10 mg total) by mouth 4 (four) times daily -  before meals and at bedtime.  Marland Kitchen estradiol (ESTRACE) 0.1 MG/GM vaginal cream APPLY 0.5MG  (PEA SIZED AMOUNT) JUST INSIDE THE VAGINA WITH FINGER-TIP ON MONDAY, WEDNESDAY AND FRIDAY NIGHTS.  Marland Kitchen FOLIC MVEH-M0-N47-S PO Take by mouth.  . furosemide (LASIX) 20 MG tablet Take 1 tablet (20 mg total) by mouth 2 (two) times  daily.  Marland Kitchen glucose blood (ONE TOUCH ULTRA TEST) test strip Use to test blood sugar once daily E11.49 (Patient taking differently: Use to test blood sugar twice daily E11.49)  . levothyroxine (SYNTHROID, LEVOTHROID) 50 MCG tablet Take 50 mcg by mouth daily before breakfast.  . linagliptin (TRADJENTA) 5 MG TABS tablet Take by mouth.  . metFORMIN (GLUCOPHAGE-XR) 500 MG 24 hr tablet Take 500 mg by mouth 2 (two) times daily.   . Multiple Vitamin (CALCIUM COMPLEX PO) Take 1 tablet by mouth daily.  . NON FORMULARY Trivita Supplement B12 Takes qd. B6 qd, and folic acid  . Olopatadine HCl 0.2 % SOLN 1 drop once daily  . Omega-3 1000 MG CAPS Take 2,000 mg by mouth daily.   . potassium chloride (KLOR-CON) 10 MEQ tablet Take 1 tablet (10 mEq total) by mouth 2 (two) times daily.  . psyllium (METAMUCIL) 58.6 % packet Take 1 packet by mouth 2 (two) times daily.   Marland Kitchen SANTYL ointment Apply 1 application topically daily.  Alveda Reasons 20 MG TABS tablet Take 1 tablet (20 mg total) by mouth daily with supper.    Past Medical History:  Diagnosis Date  . Allergy   . Anxiety   . Balance disorder   . Chronic diastolic CHF (congestive heart failure) ([place])   . Colon polyps   . Coronary artery disease, non-occlusive  a. LHC 11/2003: 10% LAD stenosis, 20% LCx stenosis, and 40% RCA stenosis; b. nuc stress test 12/15: small region of mild ischemia in the apical region with WMA also noted in the apical region, EF 60%. She declined invasive evaluation at that time  . DM2 (diabetes mellitus, type 2) ([place])   . Fall    04/2017 see careeverywhere [facility] multiple fractures, brusing   . Falls frequently    h/o left shoudler/wrist fracture and left fingers numb  . GERD (gastroesophageal reflux disease)    esophageal web  . 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 History of chicken pox   . History of eating disorder   . HLD (hyperlipidemia)   . HTN (hypertension)   . Hypothyroidism   . IBS (irritable bowel  syndrome)    diarrhea   . Incontinence of bowel   . Osteoporosis   . Ovarian cancer ([place])   . Persistent atrial fibrillation ([place])    a. CHADS2VASc = > 8 (CHF, HTN, age x 2, DM, TIA x 2, female); b. on Xarelto  . SBO (small bowel obstruction) ([place])    1998  . TIA (transient ischemic attack)   . UTI (urinary tract infection)   . Venous insufficiency     Past Surgical History:  Procedure Laterality Date  . 2nd look laparotomy  1982  . ABDOMINAL HYSTERECTOMY     for ovarian cancer s/p hysterectomy total 1976 and exp lap 1980  . APPENDECTOMY    . CARDIAC CATHETERIZATION  8/05   neg; a fib found   . CARPAL TUNNEL RELEASE Left 03/06/2018   Procedure: CARPAL TUNNEL RELEASE ENDOSCOPIC;  Surgeon: Corky Mull, MD;  Location: [place];  Service: Orthopedics;  Laterality: Left;  diabetic - oral meds  . CARPAL TUNNEL RELEASE     left CTS Dr.Poggi  . cataract OD  2002  . cataract surgery  5/07   R  . CHOLECYSTECTOMY  1986  . DEXA  9/04 and 1/02  . EGD/dilation/colon  1/06  . ESOPHAGOGASTRODUODENOSCOPY (EGD) WITH PROPOFOL N/A 05/16/2016   Procedure: ESOPHAGOGASTRODUODENOSCOPY (EGD) WITH PROPOFOL with dilation;  Surgeon: Lucilla Lame, MD;  Location: [facility];  Service: Endoscopy;  Laterality: N/A;  . FEMORAL HERNIA REPAIR  1958   R  . fracture L elbow/wrist  2000  . intussception/obstruction  12/98  . JOINT REPLACEMENT     left shoulder  . kidney stone x2  1991  . laminectomy L4-5  1971  . LUMBAR LAMINECTOMY     1970 ruptured disc   . MOUTH SURGERY    . myoview stress  8/05   syncope (-)  . REVERSE SHOULDER ARTHROPLASTY Left 10/06/2014   Procedure: REVERSE SHOULDER ARTHROPLASTY;  Surgeon: Corky Mull, MD;  Location: [facility];  Service: Orthopedics;  Laterality: Left;  . stillbirth  1969  . TOTAL ABDOMINAL HYSTERECTOMY W/ BILATERAL SALPINGOOPHORECTOMY  1980   ovarian cancer  . WRIST FRACTURE SURGERY  11/05   R    Social History Social History   Tobacco Use    . Smoking status: Never Smoker  . Smokeless tobacco: Never Used  Vaping Use  . Vaping Use: Never used  Substance Use Topics  . Alcohol use: No    Comment: may have occasional glass of wine  . Drug use: No    Family History Family History  Problem Relation Age of Onset  . Early death Father   . Diabetes Mother   . Heart disease  Mother   . Arthritis Brother   . Cancer Brother   . Depression Brother   . Hearing loss Brother   . Arthritis Brother   . Cancer Brother        colon  . Hearing loss Brother   . Cancer Brother   . Diabetes Brother   . Hearing loss Brother   . Heart disease Brother   . Hyperlipidemia Brother   . Heart disease Sister   . Hypertension Sister   . Stroke Sister   . Hearing loss Daughter   . Hypertension Daughter   . Diabetes Paternal Grandfather   . Hearing loss Sister   . Heart disease Sister   . Arthritis Sister   . Depression Sister   . Hearing loss Sister   . Heart disease Sister   . COPD Brother   . Early death Brother   . Early death Brother     Allergies  Allergen Reactions  . Amiodarone Other (See Comments)    Severe Thyroid issues   . Penicillins Anaphylaxis and Other (See Comments)    Has patient had a PCN reaction causing immediate rash, facial/tongue/throat swelling, SOB or lightheadedness with hypotension: Yes Has patient had a PCN reaction causing severe rash involving mucus membranes or skin necrosis: No Has patient had a PCN reaction that required hospitalization: No Has patient had a PCN reaction occurring within the last 10 years: No If all of the above answers are "NO", then may proceed with Cephalosporin use.   . Actos [Pioglitazone]     Not effective   . Amaryl [Glimepiride]     Not effective    . Atorvastatin Other (See Comments)    Muscle aches & All statins per Patient  . Ciprofloxacin Other (See Comments)    High blood pressure    . Codeine Nausea And Vomiting  . Ezetimibe-Simvastatin Other (See Comments)     Muscle aches, nausea, back pain   . Fosamax [Alendronate]     dysphagia  . Lovastatin Other (See Comments)    Myalgias  . Macrobid [Nitrofurantoin Macrocrystal]     Severe Itching, rash   . Protonix [Pantoprazole Sodium]     Esophageal problems    . Rosiglitazone Maleate Other (See Comments)    Edema  . Statins     Muscle and joint aches to all statins  . Victoza [Liraglutide]     Nausea   . Alendronate Sodium Rash    Dysphagia and ulceration   . Bactrim [Sulfamethoxazole-Trimethoprim] Rash    itching  . Sulfa Antibiotics Rash    Itching      REVIEW OF SYSTEMS (Negative unless checked)  Constitutional: [] Weight loss  [] Fever  [] Chills Cardiac: [] Chest pain   [] Chest pressure   [] Palpitations   [] Shortness of breath when laying flat   [] Shortness of breath with exertion. Vascular:  [] Pain in legs with walking   [x] Pain in legs at rest  [] History of DVT   [] Phlebitis   [x] Swelling in legs   [] Varicose veins   [] Non-healing ulcers Pulmonary:   [] Uses home oxygen   [] Productive cough   [] Hemoptysis   [] Wheeze  [] COPD   [] Asthma Neurologic:  [] Dizziness   [] Seizures   [] History of stroke   [] History of TIA  [] Aphasia   [] Vissual changes   [] Weakness or numbness in arm   [] Weakness or numbness in leg Musculoskeletal:   [] Joint swelling   [x] Joint pain   [] Low back pain Hematologic:  [] Easy bruising  [] Easy bleeding   []   Hypercoagulable state   [] Anemic Gastrointestinal:  [] Diarrhea   [] Vomiting  [] Gastroesophageal reflux/heartburn   [] Difficulty swallowing. Genitourinary:  [] Chronic kidney disease   [] Difficult urination  [] Frequent urination   [] Blood in urine Skin:  [] Rashes   [] Ulcers  Psychological:  [] History of anxiety   []  History of major depression.  Physical Examination  Vitals:   12/22/19 1041  BP: (!) 154/84  Pulse: (!) 101  Weight: 172 lb (78 kg)  Height: 5\' 3"  (1.6 m)   Body mass index is 30.47 kg/m. Gen: WD/WN, NAD Head: [place]/AT, No temporalis wasting.   Ear/Nose/Throat: Hearing grossly intact, nares w/o erythema or drainage Eyes: PER, EOMI, sclera nonicteric.  Neck: Supple, no large masses.   Pulmonary:  Good air movement, no audible wheezing bilaterally, no use of accessory muscles.  Cardiac: RRR, no JVD Vascular: scattered varicosities present bilaterally.  severe venous stasis changes to the legs bilaterally.  4+ hard non-pitting edema Gastrointestinal: Non-distended. No guarding/no peritoneal signs.  Musculoskeletal: M/S 5/5 throughout.  No deformity or atrophy.  Neurologic: CN 2-12 intact. Symmetrical.  Speech is fluent. Motor exam as listed above. Psychiatric: Judgment intact, Mood & affect appropriate for pt's clinical situation. Dermatologic: No rashes or ulcers noted.  No changes consistent with cellulitis. Lymph : + lichenification with severe skin changes of chronic lymphedema.  CBC Lab Results  Component Value Date   WBC 7.1 09/26/2019   HGB 12.6 09/26/2019   HCT 37.7 09/26/2019   MCV 97 09/26/2019   PLT 161 09/26/2019    BMET    Component Value Date/Time   NA 140 09/26/2019 1011   K 4.9 09/26/2019 1011   CL 101 09/26/2019 1011   CO2 25 09/26/2019 1011   GLUCOSE 155 (H) 09/26/2019 1011   GLUCOSE 156 (H) 11/18/2018 1026   BUN 22 09/26/2019 1011   CREATININE 0.68 09/26/2019 1011   CALCIUM 9.6 09/26/2019 1011   GFRNONAA 77 09/26/2019 1011   GFRAA 89 09/26/2019 1011   CrCl cannot be calculated (Patient's most recent lab result is older than the maximum 21 days allowed.).  COAG Lab Results  Component Value Date   INR 1.17 10/06/2014   INR 1.70 10/01/2014   INR 2.0 03/26/2012    Radiology No results found.    Assessment/Plan 1. Chronic venous insufficiency  No surgery or intervention at this point in time.    I have reviewed my discussion with the patient regarding lymphedema and why it  causes symptoms.  Patient will continue wearing graduated compression wraps on a daily basis a prescription was  given. The patient is reminded to put the stockings on first thing in the morning and removing them in the evening. The patient is instructed specifically not to sleep in the stockings.   In addition, behavioral modification throughout the day will be continued.  This will include frequent elevation (such as in a recliner), use of over the counter pain medications as needed and exercise such as walking.  I have reviewed systemic causes for chronic edema such as liver, kidney and cardiac etiologies and there does not appear to be any significant changes in these organ systems over the past year.  The patient is under the impression that these organ systems are all stable and unchanged.    The patient will continue aggressive use of the  lymph pump.  She will also contact the pump company to have it checked as I have never seen a patient whose edema worsened with daily use of a pump.  The patient will follow-up with me in 4 weeks    2. Lymphedema No surgery or intervention at this point in time.    I have reviewed my discussion with the patient regarding lymphedema and why it  causes symptoms.  Patient will continue wearing graduated compression wraps on a daily basis a prescription was given. The patient is reminded to put the stockings on first thing in the morning and removing them in the evening. The patient is instructed specifically not to sleep in the stockings.   In addition, behavioral modification throughout the day will be continued.  This will include frequent elevation (such as in a recliner), use of over the counter pain medications as needed and exercise such as walking.  I have reviewed systemic causes for chronic edema such as liver, kidney and cardiac etiologies and there does not appear to be any significant changes in these organ systems over the past year.  The patient is under the impression that these organ systems are all stable and unchanged.    The patient will continue  aggressive use of the  lymph pump.  She will also contact the pump company to have it checked as I have never seen a patient whose edema worsened with daily use of a pump.    The patient will follow-up with me in 4 weeks     3. PAD (peripheral artery disease) (HCC) Recommend:  I do not find evidence of life style limiting vascular disease. The patient specifically denies life style limitation.  Previous noninvasive studies including ABI's of the legs do not identify critical vascular problems.  The patient should continue walking and begin a more formal exercise program. The patient should continue his antiplatelet therapy and aggressive treatment of the lipid abnormalities.  The patient should begin wearing graduated compression wraps mmHg strength to control her edema.  4. Coronary artery disease, non-occlusive Continue cardiac and antihypertensive medications as already ordered and reviewed, no changes at this time.  Continue statin as ordered and reviewed, no changes at this time  Nitrates PRN for chest pain   5. Persistent atrial fibrillation (HCC) Continue antiarrhythmia medications as already ordered, these medications have been reviewed and there are no changes at this time.  Continue anticoagulation as ordered by Cardiology Service   6. Type 2 diabetes mellitus with diabetic polyneuropathy, without long-term current use of insulin (HCC) Continue hypoglycemic medications as already ordered, these medications have been reviewed and there are no changes at this time.  Hgb A1C to be monitored as already arranged by primary service    Hortencia Pilar, MD  12/22/2019 10:58 AM

## 2019-12-24 ENCOUNTER — Telehealth: Payer: Self-pay | Admitting: Cardiovascular Disease

## 2019-12-24 NOTE — Telephone Encounter (Signed)
I called and spoke with the patient. I have advised her of Ignacia Bayley, NP's recommendations to: - hold Xarelto starting now - follow up with an opthalmologist / Urgent Care tomorrow  The patient voices understanding and is agreeable.

## 2019-12-24 NOTE — Telephone Encounter (Signed)
Pt c/o medication issue:  1. Name of Medication: Xarelto  2. How are you currently taking this medication (dosage and times per day)? 20 MG 1 tablet daily with supper  3. Are you having a reaction (difficulty breathing--STAT)? Bleeding in eye  4. What is your medication issue? Patient calling, would like to know if Dr Rockey Situ wants her to stop her Xarelto medication due to an eye bleed.  Please call to discuss.

## 2019-12-24 NOTE — Telephone Encounter (Signed)
I'd prefer to hold the xarelto and have her cleared by ophthalmology to resume once they feel there is no danger.  If spread of blood has worsened, she should have the eye re-evaluated tomorrow.

## 2019-12-24 NOTE — Telephone Encounter (Signed)
I spoke with the patient regarding the reported bleeding in her eye.  Per the patient, she was sitting and reading on Friday and felt that something had happened in her eye. She denies having laughed/ coughed/ sneezed hard preceding this feeling. She noticed some "thick" blood around the pupil. She was seen by Vascular Surgery on Monday (8/30) for a follow up appointment and states the provider told on the way out she needed to have her eye looked at.  She states she had been unable to get in with her regular eye doctor, Dr. Orion Modest. She walked in to [facility] on Monday. She states a "tech" came out to see her and advised this looked like a burst blood vessel, but it should disapate on its own.   As of today, the patient states it looks like the blood has spread around her pupil a little more. She states she can see and read. She denies any pain in the eye.  She called here today concerned that she may need to hold her Xarelto. I advised the patient that is may take some time for the burst blood vessel to resolve, but will review with one of our providers if they feel her holding her Xarelto would be of any benefit. She is on this for A-fib. No history of stroke, but has had TIA.  She does not see her regular eye doctor for 2-3 weeks.

## 2019-12-30 ENCOUNTER — Telehealth (INDEPENDENT_AMBULATORY_CARE_PROVIDER_SITE_OTHER): Payer: Self-pay

## 2019-12-30 NOTE — Telephone Encounter (Signed)
HI Tia, Hieronymus all is well and you had a great weekend. I wanted to update you on patient MRN [id number].  WE followed up and saw this patient on Friday as the patient told us the Dr. Delana Meyer was concerned that the condition of her legs had worsened since starting with the pump. She had been suing the pump daily, however she has been completely noncompliant with compression garments. Thus, after each time she used the pump, her legs would regain the edema rather quickly as she was never putting any type of compression garment on after to preserve the work the pump had done. We explained this to the patient and she said she would start wearing the compression garments as well.  Just wanted to keep you updated. Thank you!  [place]

## 2019-12-31 ENCOUNTER — Other Ambulatory Visit: Payer: Self-pay

## 2019-12-31 ENCOUNTER — Ambulatory Visit: Payer: Medicare HMO | Admitting: Orthotics

## 2019-12-31 DIAGNOSIS — L97512 Non-pressure chronic ulcer of other part of right foot with fat layer exposed: Secondary | ICD-10-CM

## 2019-12-31 DIAGNOSIS — M2042 Other hammer toe(s) (acquired), left foot: Secondary | ICD-10-CM

## 2020-01-02 NOTE — Progress Notes (Signed)
returned

## 2020-01-05 ENCOUNTER — Encounter: Payer: Self-pay | Admitting: Podiatry

## 2020-01-05 ENCOUNTER — Ambulatory Visit: Payer: Medicare HMO | Admitting: Podiatry

## 2020-01-05 ENCOUNTER — Other Ambulatory Visit: Payer: Self-pay

## 2020-01-05 DIAGNOSIS — L97512 Non-pressure chronic ulcer of other part of right foot with fat layer exposed: Secondary | ICD-10-CM | POA: Diagnosis not present

## 2020-01-05 DIAGNOSIS — B351 Tinea unguium: Secondary | ICD-10-CM

## 2020-01-05 DIAGNOSIS — D689 Coagulation defect, unspecified: Secondary | ICD-10-CM | POA: Diagnosis not present

## 2020-01-05 DIAGNOSIS — M79676 Pain in unspecified toe(s): Secondary | ICD-10-CM

## 2020-01-05 NOTE — Progress Notes (Signed)
Complaint:  Visit Type: Patient returns to my office for continued preventative foot care services. Complaint: Patient states" my nails have grown long and thick and become painful to walk and wear shoes" Patient has been diagnosed with DM with neuropathy.. The patient presents for preventative foot care services. No changes to ROS.  Patient is taking xarelto.  Patient has been under care of her big toe ulcer by Dr.  Posey Pronto.  She says the ulcer started healing after she was prescribed santyl.  Podiatric Exam: Vascular: dorsalis pedis and posterior tibial pulses are palpable bilateral. Capillary return is immediate. Temperature gradient is WNL. Skin turgor WNL  Sensorium: Diminished  Semmes Weinstein monofilament test. Normal tactile sensation bilaterally. Nail Exam: Pt has thick disfigured discolored nails with subungual debris noted bilateral entire nail hallux through fifth toenails Ulcer Exam: There is no evidence of ulcer or pre-ulcerative changes or infection. Previous ulcer right hallux has healed. Orthopedic Exam: Muscle tone and strength are WNL. No limitations in general ROM. No crepitus or effusions noted. Foot type and digits show no abnormalities. Bony prominences are unremarkable.  Skin: No Porokeratosis. No infection or ulcers  Diagnosis:  Onychomycosis, , Pain in right toe, pain in left toes  S/P ulcer right hallux  Treatment & Plan Procedures and Treatment: Consent by patient was obtained for treatment procedures. The patient understood the discussion of treatment and procedures well. All questions were answered thoroughly reviewed. Debridement of mycotic and hypertrophic toenails, 1 through 5 bilateral and clearing of subungual debris. No ulceration, no infection noted.  Patient says she has a problem with paying for her diabetic shoes and wishes to cancel her order. I will discuss this tomorrow with Liliane Channel. Return Visit-Office Procedure: Patient instructed to return to the office for a  follow up visit 3 months for continued evaluation and treatment.  Gardiner Barefoot DPM               Gardiner Barefoot DPM

## 2020-01-12 ENCOUNTER — Other Ambulatory Visit: Payer: Self-pay | Admitting: Cardiovascular Disease

## 2020-01-19 ENCOUNTER — Telehealth: Payer: Self-pay | Admitting: Podiatry

## 2020-01-19 NOTE — Telephone Encounter (Signed)
Pt called and stated she talked with her insurance company and they should cover the diabetic shoes/inserts. Upon looking charges were  accidentally put in for orthotics not the diabetic shoes and inserts. Jodi Reeves has resubmitted the claim with the correct codes. Pt is aware and said thank you

## 2020-01-22 ENCOUNTER — Ambulatory Visit (INDEPENDENT_AMBULATORY_CARE_PROVIDER_SITE_OTHER): Payer: Medicare HMO | Admitting: Vascular Surgery

## 2020-01-22 ENCOUNTER — Encounter (INDEPENDENT_AMBULATORY_CARE_PROVIDER_SITE_OTHER): Payer: Self-pay | Admitting: Vascular Surgery

## 2020-01-22 ENCOUNTER — Other Ambulatory Visit: Payer: Self-pay

## 2020-01-22 VITALS — BP 141/81 | HR 87 | Ht 63.0 in | Wt 169.0 lb

## 2020-01-22 DIAGNOSIS — I4891 Unspecified atrial fibrillation: Secondary | ICD-10-CM

## 2020-01-22 DIAGNOSIS — I10 Essential (primary) hypertension: Secondary | ICD-10-CM | POA: Diagnosis not present

## 2020-01-22 DIAGNOSIS — I872 Venous insufficiency (chronic) (peripheral): Secondary | ICD-10-CM | POA: Diagnosis not present

## 2020-01-22 DIAGNOSIS — E1142 Type 2 diabetes mellitus with diabetic polyneuropathy: Secondary | ICD-10-CM

## 2020-01-22 DIAGNOSIS — I890 Lymphedema, not elsewhere classified: Secondary | ICD-10-CM | POA: Diagnosis not present

## 2020-01-22 NOTE — Progress Notes (Signed)
MRN : [id number]  Jodi Reeves is a 86 y.o. (Jun 23, 1927) female who presents with chief complaint of  Chief Complaint  Patient presents with  . Follow-up    1 Mo no studies  .  History of Present Illness:   The patient returns to the office for followup evaluation regarding leg swelling.  The swelling has improved and the pain associated with swelling has decreased. There have not been any interval development of a ulcerations or wounds.  Since the last visit she has now been wearing compression wraps with good result.  She continues to use her pump and she did have the pump checked and it is functioning properly.  The patient also states elevation during the day and exercise is being done too.     Current Meds  Medication Sig  . acetaminophen (TYLENOL) 500 MG tablet Take 1,000 mg by mouth every 6 (six) hours as needed for moderate pain or headache.  . Ascorbic Acid (VITAMIN C) POWD Take 200 mg by mouth daily.   . bisoprolol (ZEBETA) 5 MG tablet Take 1 tablet (5 mg total) by mouth daily.  Marland Kitchen co-enzyme Q-10 30 MG capsule Take 30 mg by mouth daily.   Marland Kitchen dicyclomine (BENTYL) 10 MG capsule Take 1 capsule (10 mg total) by mouth 4 (four) times daily -  before meals and at bedtime.  Marland Kitchen estradiol (ESTRACE) 0.1 MG/GM vaginal cream APPLY 0.5MG  (PEA SIZED AMOUNT) JUST INSIDE THE VAGINA WITH FINGER-TIP ON MONDAY, WEDNESDAY AND FRIDAY NIGHTS.  Marland Kitchen FOLIC [id number]-[id number]-Y PO Take by mouth.  . furosemide (LASIX) 20 MG tablet Take 1 tablet (20 mg total) by mouth 2 (two) times daily.  Marland Kitchen glucose blood (ONE TOUCH ULTRA TEST) test strip Use to test blood sugar once daily E11.49 (Patient taking differently: Use to test blood sugar twice daily E11.49)  . levothyroxine (SYNTHROID, LEVOTHROID) 50 MCG tablet Take 50 mcg by mouth daily before breakfast.  . linagliptin (TRADJENTA) 5 MG TABS tablet Take by mouth.  . losartan (COZAAR) 25 MG tablet Take 0.5 tablets (12.5 mg total) by mouth daily.  . metFORMIN  (GLUCOPHAGE-XR) 500 MG 24 hr tablet Take 500 mg by mouth 2 (two) times daily.   . Multiple Vitamin (CALCIUM COMPLEX PO) Take 1 tablet by mouth daily.  . NON FORMULARY [place] Supplement B12 Takes qd. B6 qd, and folic acid  . Olopatadine HCl 0.2 % SOLN 1 drop once daily  . Omega-3 1000 MG CAPS Take 2,000 mg by mouth daily.   . potassium chloride (KLOR-CON) 10 MEQ tablet Take 1 tablet (10 mEq total) by mouth 2 (two) times daily.  . prednisoLONE acetate (PRED FORTE) 1 % ophthalmic suspension   . psyllium (METAMUCIL) 58.6 % packet Take 1 packet by mouth 2 (two) times daily.   Marland Kitchen SANTYL ointment Apply 1 application topically daily.  Alveda Reasons 20 MG TABS tablet Take 1 tablet (20 mg total) by mouth daily with supper.    Past Medical History:  Diagnosis Date  . Allergy   . Anxiety   . Balance disorder   . Chronic diastolic CHF (congestive heart failure) ([place])   . Colon polyps   . Coronary artery disease, non-occlusive    a. LHC 11/2003: 10% LAD stenosis, 20% LCx stenosis, and 40% RCA stenosis; b. nuc stress test 12/15: small region of mild ischemia in the apical region with WMA also noted in the apical region, EF 60%. She declined invasive evaluation at that time  . DM2 (diabetes mellitus,  type 2) ([place])   . Fall    04/2017 see careeverywhere [facility] multiple fractures, brusing   . Falls frequently    h/o left shoudler/wrist fracture and left fingers numb  . GERD (gastroesophageal reflux disease)    esophageal web  . 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 History of chicken pox   . History of eating disorder   . HLD (hyperlipidemia)   . HTN (hypertension)   . Hypothyroidism   . IBS (irritable bowel syndrome)    diarrhea   . Incontinence of bowel   . Osteoporosis   . Ovarian cancer ([place])   . Persistent atrial fibrillation ([place])    a. CHADS2VASc = > 8 (CHF, HTN, age x 2, DM, TIA x 2, female); b. on Xarelto  . SBO (small bowel obstruction) ([place])    1998  . TIA (transient ischemic  attack)   . UTI (urinary tract infection)   . Venous insufficiency     Past Surgical History:  Procedure Laterality Date  . 2nd look laparotomy  1982  . ABDOMINAL HYSTERECTOMY     for ovarian cancer s/p hysterectomy total 1976 and exp lap 1980  . APPENDECTOMY    . CARDIAC CATHETERIZATION  8/05   neg; a fib found   . CARPAL TUNNEL RELEASE Left 03/06/2018   Procedure: CARPAL TUNNEL RELEASE ENDOSCOPIC;  Surgeon: Corky Mull, MD;  Location: [place];  Service: Orthopedics;  Laterality: Left;  diabetic - oral meds  . CARPAL TUNNEL RELEASE     left CTS Dr.Poggi  . cataract OD  2002  . cataract surgery  5/07   R  . CHOLECYSTECTOMY  1986  . DEXA  9/04 and 1/02  . EGD/dilation/colon  1/06  . ESOPHAGOGASTRODUODENOSCOPY (EGD) WITH PROPOFOL N/A 05/16/2016   Procedure: ESOPHAGOGASTRODUODENOSCOPY (EGD) WITH PROPOFOL with dilation;  Surgeon: Lucilla Lame, MD;  Location: [facility];  Service: Endoscopy;  Laterality: N/A;  . FEMORAL HERNIA REPAIR  1958   R  . fracture L elbow/wrist  2000  . intussception/obstruction  12/98  . JOINT REPLACEMENT     left shoulder  . kidney stone x2  1991  . laminectomy L4-5  1971  . LUMBAR LAMINECTOMY     1970 ruptured disc   . MOUTH SURGERY    . myoview stress  8/05   syncope (-)  . REVERSE SHOULDER ARTHROPLASTY Left 10/06/2014   Procedure: REVERSE SHOULDER ARTHROPLASTY;  Surgeon: Corky Mull, MD;  Location: [facility];  Service: Orthopedics;  Laterality: Left;  . stillbirth  1969  . TOTAL ABDOMINAL HYSTERECTOMY W/ BILATERAL SALPINGOOPHORECTOMY  1980   ovarian cancer  . WRIST FRACTURE SURGERY  11/05   R    Social History Social History   Tobacco Use  . Smoking status: Never Smoker  . Smokeless tobacco: Never Used  Vaping Use  . Vaping Use: Never used  Substance Use Topics  . Alcohol use: No    Comment: may have occasional glass of wine  . Drug use: No    Family History Family History  Problem Relation Age of Onset  . Early  death Father   . Diabetes Mother   . Heart disease Mother   . Arthritis Brother   . Cancer Brother   . Depression Brother   . Hearing loss Brother   . Arthritis Brother   . Cancer Brother        colon  . Hearing loss Brother   . Cancer Brother   .  Diabetes Brother   . Hearing loss Brother   . Heart disease Brother   . Hyperlipidemia Brother   . Heart disease Sister   . Hypertension Sister   . Stroke Sister   . Hearing loss Daughter   . Hypertension Daughter   . Diabetes Paternal Grandfather   . Hearing loss Sister   . Heart disease Sister   . Arthritis Sister   . Depression Sister   . Hearing loss Sister   . Heart disease Sister   . COPD Brother   . Early death Brother   . Early death Brother     Allergies  Allergen Reactions  . Amiodarone Other (See Comments)    Severe Thyroid issues   . Penicillins Anaphylaxis and Other (See Comments)    Has patient had a PCN reaction causing immediate rash, facial/tongue/throat swelling, SOB or lightheadedness with hypotension: Yes Has patient had a PCN reaction causing severe rash involving mucus membranes or skin necrosis: No Has patient had a PCN reaction that required hospitalization: No Has patient had a PCN reaction occurring within the last 10 years: No If all of the above answers are "NO", then may proceed with Cephalosporin use.   . Actos [Pioglitazone]     Not effective   . Amaryl [Glimepiride]     Not effective    . Atorvastatin Other (See Comments)    Muscle aches & All statins per Patient  . Ciprofloxacin Other (See Comments)    High blood pressure    . Codeine Nausea And Vomiting  . Ezetimibe-Simvastatin Other (See Comments)    Muscle aches, nausea, back pain   . Fosamax [Alendronate]     dysphagia  . Lovastatin Other (See Comments)    Myalgias  . Macrobid [Nitrofurantoin Macrocrystal]     Severe Itching, rash   . Protonix [Pantoprazole Sodium]     Esophageal problems    . Rosiglitazone Maleate Other  (See Comments)    Edema  . Statins     Muscle and joint aches to all statins  . Victoza [Liraglutide]     Nausea   . Alendronate Sodium Rash    Dysphagia and ulceration   . Bactrim [Sulfamethoxazole-Trimethoprim] Rash    itching  . Sulfa Antibiotics Rash    Itching      REVIEW OF SYSTEMS (Negative unless checked)  Constitutional: [] Weight loss  [] Fever  [] Chills Cardiac: [] Chest pain   [] Chest pressure   [] Palpitations   [] Shortness of breath when laying flat   [] Shortness of breath with exertion. Vascular:  [] Pain in legs with walking   [] Pain in legs at rest  [] History of DVT   [] Phlebitis   [x] Swelling in legs   [] Varicose veins   [] Non-healing ulcers Pulmonary:   [] Uses home oxygen   [] Productive cough   [] Hemoptysis   [] Wheeze  [] COPD   [] Asthma Neurologic:  [] Dizziness   [] Seizures   [] History of stroke   [] History of TIA  [] Aphasia   [] Vissual changes   [] Weakness or numbness in arm   [x] Weakness or numbness in leg Musculoskeletal:   [] Joint swelling   [] Joint pain   [] Low back pain Hematologic:  [] Easy bruising  [] Easy bleeding   [] Hypercoagulable state   [] Anemic Gastrointestinal:  [] Diarrhea   [] Vomiting  [] Gastroesophageal reflux/heartburn   [] Difficulty swallowing. Genitourinary:  [] Chronic kidney disease   [] Difficult urination  [] Frequent urination   [] Blood in urine Skin:  [] Rashes   [] Ulcers  Psychological:  [] History of anxiety   []  History  of major depression.  Physical Examination  Vitals:   01/22/20 1140  BP: (!) 141/81  Pulse: 87  Weight: 169 lb (76.7 kg)  Height: 5\' 3"  (1.6 m)   Body mass index is 29.94 kg/m. Gen: WD/WN, NAD Head: [place]/AT, No temporalis wasting.  Ear/Nose/Throat: Hearing grossly intact, nares w/o erythema or drainage Eyes: PER, EOMI, sclera nonicteric.  Neck: Supple, no large masses.   Pulmonary:  Good air movement, no audible wheezing bilaterally, no use of accessory muscles.  Cardiac: RRR, no JVD Vascular:  Edema is well  controlled and wraps are properly placed Vessel Right Left  Radial Palpable Palpable  Gastrointestinal: Non-distended. No guarding/no peritoneal signs.  Musculoskeletal: M/S 5/5 throughout.  No deformity or atrophy.  Neurologic: CN 2-12 intact. Symmetrical.  Speech is fluent. Motor exam as listed above. Psychiatric: Judgment intact, Mood & affect appropriate for pt's clinical situation. Dermatologic: No rashes or ulcers noted.  No changes consistent with cellulitis. Lymph : + lichenification and skin changes of chronic lymphedema.  CBC Lab Results  Component Value Date   WBC 7.1 09/26/2019   HGB 12.6 09/26/2019   HCT 37.7 09/26/2019   MCV 97 09/26/2019   PLT 161 09/26/2019    BMET    Component Value Date/Time   NA 140 09/26/2019 1011   K 4.9 09/26/2019 1011   CL 101 09/26/2019 1011   CO2 25 09/26/2019 1011   GLUCOSE 155 (H) 09/26/2019 1011   GLUCOSE 156 (H) 11/18/2018 1026   BUN 22 09/26/2019 1011   CREATININE 0.68 09/26/2019 1011   CALCIUM 9.6 09/26/2019 1011   GFRNONAA 77 09/26/2019 1011   GFRAA 89 09/26/2019 1011   CrCl cannot be calculated (Patient's most recent lab result is older than the maximum 21 days allowed.).  COAG Lab Results  Component Value Date   INR 1.17 10/06/2014   INR 1.70 10/01/2014   INR 2.0 03/26/2012    Radiology No results found.   Assessment/Plan 1. Lymphedema  No surgery or intervention at this point in time.    I have reviewed my discussion with the patient regarding lymphedema and why it  causes symptoms.  Patient will continue wearing graduated compression stockings class 1 (20-30 mmHg) on a daily basis a prescription was given. The patient is reminded to put the stockings on first thing in the morning and removing them in the evening. The patient is instructed specifically not to sleep in the stockings.   In addition, behavioral modification throughout the day will be continued.  This will include frequent elevation (such as in a  recliner), use of over the counter pain medications as needed and exercise such as walking.  I have reviewed systemic causes for chronic edema such as liver, kidney and cardiac etiologies and there does not appear to be any significant changes in these organ systems over the past year.  The patient is under the impression that these organ systems are all stable and unchanged.    The patient will continue aggressive use of the  lymph pump.  This will continue to improve the edema control and prevent sequela such as ulcers and infections.   The patient will follow-up with me on an annual basis.    2. Essential hypertension Continue antihypertensive medications as already ordered, these medications have been reviewed and there are no changes at this time.   3. Chronic venous insufficiency  No surgery or intervention at this point in time.    I have reviewed my discussion with the  patient regarding lymphedema and why it  causes symptoms.  Patient will continue wearing graduated compression stockings class 1 (20-30 mmHg) on a daily basis a prescription was given. The patient is reminded to put the stockings on first thing in the morning and removing them in the evening. The patient is instructed specifically not to sleep in the stockings.   In addition, behavioral modification throughout the day will be continued.  This will include frequent elevation (such as in a recliner), use of over the counter pain medications as needed and exercise such as walking.  I have reviewed systemic causes for chronic edema such as liver, kidney and cardiac etiologies and there does not appear to be any significant changes in these organ systems over the past year.  The patient is under the impression that these organ systems are all stable and unchanged.    The patient will continue aggressive use of the  lymph pump.  This will continue to improve the edema control and prevent sequela such as ulcers and infections.     The patient will follow-up with me on an annual basis.    4. Atrial fibrillation, unspecified type ([place]) Continue antiarrhythmia medications as already ordered, these medications have been reviewed and there are no changes at this time.  Continue anticoagulation as ordered by Cardiology Service   5. Type 2 diabetes mellitus with diabetic polyneuropathy, without long-term current use of insulin ([facility]) Continue hypoglycemic medications as already ordered, these medications have been reviewed and there are no changes at this time.  Hgb A1C to be monitored as already arranged by primary service    Hortencia Pilar, MD  01/22/2020 12:48 PM

## 2020-02-02 ENCOUNTER — Ambulatory Visit: Payer: Medicare HMO | Attending: Internal Medicine

## 2020-02-02 DIAGNOSIS — Z23 Encounter for immunization: Secondary | ICD-10-CM

## 2020-02-02 NOTE — Progress Notes (Signed)
   Covid-19 Vaccination Clinic  Name:  Jodi Reeves    MRN: [id number] DOB: 03-11-1928  02/02/2020  Jodi Reeves was observed post Covid-19 immunization for 30 minutes based on pre-vaccination screening without incident. She was provided with Vaccine Information Sheet and instruction to access the V-Safe system.   Jodi Reeves was instructed to call 911 with any severe reactions post vaccine: Marland Kitchen Difficulty breathing  . Swelling of face and throat  . A fast heartbeat  . A bad rash all over body  . Dizziness and weakness

## 2020-02-03 ENCOUNTER — Other Ambulatory Visit: Payer: Self-pay | Admitting: Gastroenterology

## 2020-02-05 ENCOUNTER — Ambulatory Visit (INDEPENDENT_AMBULATORY_CARE_PROVIDER_SITE_OTHER): Payer: Medicare HMO | Admitting: Vascular Surgery

## 2020-02-05 ENCOUNTER — Encounter (INDEPENDENT_AMBULATORY_CARE_PROVIDER_SITE_OTHER): Payer: Medicare HMO

## 2020-02-11 ENCOUNTER — Other Ambulatory Visit: Payer: Self-pay

## 2020-02-11 ENCOUNTER — Ambulatory Visit: Payer: Medicare HMO | Admitting: Orthotics

## 2020-02-11 DIAGNOSIS — E1149 Type 2 diabetes mellitus with other diabetic neurological complication: Secondary | ICD-10-CM

## 2020-02-11 DIAGNOSIS — B351 Tinea unguium: Secondary | ICD-10-CM

## 2020-02-11 DIAGNOSIS — M2042 Other hammer toe(s) (acquired), left foot: Secondary | ICD-10-CM

## 2020-02-11 DIAGNOSIS — E1140 Type 2 diabetes mellitus with diabetic neuropathy, unspecified: Secondary | ICD-10-CM

## 2020-02-11 DIAGNOSIS — M79676 Pain in unspecified toe(s): Secondary | ICD-10-CM

## 2020-02-11 DIAGNOSIS — L97512 Non-pressure chronic ulcer of other part of right foot with fat layer exposed: Secondary | ICD-10-CM

## 2020-02-11 NOTE — Progress Notes (Signed)
Patient picked up shoes (reordered)

## 2020-03-01 ENCOUNTER — Telehealth (INDEPENDENT_AMBULATORY_CARE_PROVIDER_SITE_OTHER): Payer: Self-pay | Admitting: Vascular Surgery

## 2020-03-01 NOTE — Telephone Encounter (Signed)
We would not be able to assess if the pump is functioning properly, she may need to reach out to the manufacturer.  With the lymphedema pump, it may not completely resolve swelling but help to maintain control, with the addition of wearing compression socks regularly as well as elevation.  We can bring her in with no studies.Marland Kitchenas time allows on the schedule

## 2020-03-01 NOTE — Telephone Encounter (Addendum)
Called stating she wants to come in to be seen to discuss if the lymph pump is working the way it should or if there is something she can do tohelp her legs. She states she hasnt seen much progress and would like to come in to be seen. I can move her f/u with GS up but need to know if an [place] is needed. Patient was last seen 01/22/20 as a f/u with GS. Please advise.

## 2020-03-01 NOTE — Telephone Encounter (Signed)
Gave patient the advice from the NP. Patient decided to wait before she makes an appt.

## 2020-03-09 ENCOUNTER — Encounter: Payer: Self-pay | Admitting: Internal Medicine

## 2020-03-09 LAB — HM DIABETES EYE EXAM

## 2020-03-10 ENCOUNTER — Ambulatory Visit: Payer: Medicare HMO | Admitting: Internal Medicine

## 2020-03-10 ENCOUNTER — Encounter: Payer: Self-pay | Admitting: Internal Medicine

## 2020-03-10 ENCOUNTER — Other Ambulatory Visit: Payer: Self-pay

## 2020-03-10 VITALS — BP 116/80 | HR 86 | Temp 98.7°F | Ht 63.0 in | Wt 171.4 lb

## 2020-03-10 DIAGNOSIS — N3281 Overactive bladder: Secondary | ICD-10-CM

## 2020-03-10 DIAGNOSIS — I890 Lymphedema, not elsewhere classified: Secondary | ICD-10-CM

## 2020-03-10 DIAGNOSIS — R1310 Dysphagia, unspecified: Secondary | ICD-10-CM

## 2020-03-10 DIAGNOSIS — I152 Hypertension secondary to endocrine disorders: Secondary | ICD-10-CM | POA: Diagnosis not present

## 2020-03-10 DIAGNOSIS — E039 Hypothyroidism, unspecified: Secondary | ICD-10-CM

## 2020-03-10 DIAGNOSIS — E669 Obesity, unspecified: Secondary | ICD-10-CM

## 2020-03-10 DIAGNOSIS — E1159 Type 2 diabetes mellitus with other circulatory complications: Secondary | ICD-10-CM | POA: Diagnosis not present

## 2020-03-10 DIAGNOSIS — Z9889 Other specified postprocedural states: Secondary | ICD-10-CM

## 2020-03-10 DIAGNOSIS — Z1389 Encounter for screening for other disorder: Secondary | ICD-10-CM

## 2020-03-10 LAB — COMPREHENSIVE METABOLIC PANEL
ALT: 6 U/L (ref 0–35)
AST: 20 U/L (ref 0–37)
Albumin: 4.1 g/dL (ref 3.5–5.2)
Alkaline Phosphatase: 54 U/L (ref 39–117)
BUN: 16 mg/dL (ref 6–23)
CO2: 28 mEq/L (ref 19–32)
Calcium: 9.1 mg/dL (ref 8.4–10.5)
Chloride: 103 mEq/L (ref 96–112)
Creatinine, Ser: 0.65 mg/dL (ref 0.40–1.20)
GFR: 77.4 mL/min (ref >60.00)
Glucose, Bld: 131 mg/dL — ABNORMAL HIGH (ref 70–99)
Potassium: 4.3 mEq/L (ref 3.5–5.1)
Sodium: 140 mEq/L (ref 135–145)
Total Bilirubin: 0.7 mg/dL (ref 0.2–1.2)
Total Protein: 7.2 g/dL (ref 6.0–8.3)

## 2020-03-10 LAB — CBC WITH DIFFERENTIAL/PLATELET
Basophils Absolute: 0 10*3/uL (ref 0.0–0.1)
Basophils Relative: 0.4 % (ref 0.0–3.0)
Eosinophils Absolute: 0.1 10*3/uL (ref 0.0–0.7)
Eosinophils Relative: 1.1 % (ref 0.0–5.0)
HCT: 35.8 % — ABNORMAL LOW (ref 36.0–46.0)
Hemoglobin: 12.1 g/dL (ref 12.0–15.0)
Lymphocytes Relative: 27.8 % (ref 12.0–46.0)
Lymphs Abs: 1.7 10*3/uL (ref 0.7–4.0)
MCHC: 33.9 g/dL (ref 30.0–36.0)
MCV: 99.3 fl (ref 78.0–100.0)
Monocytes Absolute: 0.4 10*3/uL (ref 0.1–1.0)
Monocytes Relative: 7 % (ref 3.0–12.0)
Neutro Abs: 4 10*3/uL (ref 1.4–7.7)
Neutrophils Relative %: 63.7 % (ref 43.0–77.0)
Platelets: 147 10*3/uL — ABNORMAL LOW (ref 150.0–400.0)
RBC: 3.6 Mil/uL — ABNORMAL LOW (ref 3.87–5.11)
RDW: 14.2 % (ref 11.5–15.5)
WBC: 6.2 10*3/uL (ref 4.0–10.5)

## 2020-03-10 LAB — LIPID PANEL
Cholesterol: 171 mg/dL (ref 0–200)
HDL: 67.5 mg/dL (ref >39.00)
LDL Cholesterol: 87 mg/dL (ref 0–99)
NonHDL: 103.02
Total CHOL/HDL Ratio: 3
Triglycerides: 79 mg/dL (ref 0.0–149.0)
VLDL: 15.8 mg/dL (ref 0.0–40.0)

## 2020-03-10 LAB — TSH: TSH: 1.28 u[IU]/mL (ref 0.35–4.50)

## 2020-03-10 NOTE — Progress Notes (Addendum)
Chief Complaint  Patient presents with  . Follow-up  . Leg Swelling   F/u  1. Chronic lymphedema has lympopress and using 2 hours total per day (1 hr in am and 1 hr in pm) but still has leg edema and unable to get on compression stockings. She sees vascular and cardiology who have rec lymphedema clinic PT I think this is a great idea pt has trouble with transportation  She has a recliner and reports limits fluid intake to 32 ounces daily   2. DM 2 with HTN A1c 7.2 03/04/20 [facility] endocrine Dr. Ladell Pier and he is thinking about rybelsus vs ozempic currently on metfomin xr 500 mg bid and off tradjenta 5 mg qd  BP on losartan 25 mg if BP >110 and zebeta 5 mg qd lasix 20 mg bid   3. Hypothyroidism on levo 50 cmg qd -she wants comp labs today  She did have a banana today   4. C/o overactive bladder  5. Dysphagia est [name] last seen 09/30/19 barium swallow neg and had dilation in the past 3-4 x will discuss with [name]    Review of Systems  Constitutional: Negative for weight loss.  HENT: Negative for hearing loss.   Eyes: Negative for blurred vision.  Respiratory: Negative for shortness of breath.   Cardiovascular: Positive for leg swelling.  Gastrointestinal: Positive for constipation and diarrhea.  Genitourinary: Positive for frequency.  Musculoskeletal: Positive for falls. Negative for joint pain.       Fall x 1   Skin: Negative for rash.  Neurological: Negative for headaches.  Psychiatric/Behavioral: Negative for memory loss.   Past Medical History:  Diagnosis Date  . Allergy   . Anxiety   . Balance disorder   . Chronic diastolic CHF (congestive heart failure) ([place])   . Colon polyps   . Coronary artery disease, non-occlusive    a. LHC 11/2003: 10% LAD stenosis, 20% LCx stenosis, and 40% RCA stenosis; b. nuc stress test 12/15: small region of mild ischemia in the apical region with WMA also noted in the apical region, EF 60%. She declined invasive evaluation at that time  .  DM2 (diabetes mellitus, type 2) ([place])   . Fall    04/2017 see care[facility]where [facility] multiple fractures, brusing   . Falls frequently    h/o left shoudler/wrist fracture and left fingers numb  . GERD (gastroesophageal reflux disease)    esophageal web  . 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 History of chicken pox   . History of eating disorder   . HLD (hyperlipidemia)   . HTN (hypertension)   . Hypothyroidism   . IBS (irritable bowel syndrome)    diarrhea   . Incontinence of bowel   . Osteoporosis   . Ovarian cancer ([place])    1980  . Persistent atrial fibrillation ([place])    a. CHADS2VASc = > 8 (CHF, HTN, age x 2, DM, TIA x 2, female); b. on Xarelto  . SBO (small bowel obstruction) ([place])    1998  . TIA (transient ischemic attack)   . UTI (urinary tract infection)   . Venous insufficiency    Past Surgical History:  Procedure Laterality Date  . 2nd look laparotomy  1982  . ABDOMINAL HYSTERECTOMY     for ovarian cancer s/p hysterectomy total 1976 and exp lap 1980  . APPENDECTOMY    . CARDIAC CATHETERIZATION  8/05   neg; a fib found   . CARPAL TUNNEL  RELEASE Left 03/06/2018   Procedure: CARPAL TUNNEL RELEASE ENDOSCOPIC;  Surgeon: Corky Mull, MD;  Location: [place];  Service: Orthopedics;  Laterality: Left;  diabetic - oral meds  . CARPAL TUNNEL RELEASE     left CTS Dr.Poggi  . cataract OD  2002  . cataract surgery  5/07   R  . CHOLECYSTECTOMY  1986  . DEXA  9/04 and 1/02  . EGD/dilation/colon  1/06  . ESOPHAGOGASTRODUODENOSCOPY (EGD) WITH PROPOFOL N/A 05/16/2016   Procedure: ESOPHAGOGASTRODUODENOSCOPY (EGD) WITH PROPOFOL with dilation;  Surgeon: Lucilla Lame, MD;  Location: [facility];  Service: Endoscopy;  Laterality: N/A;  . FEMORAL HERNIA REPAIR  1958   R  . fracture L elbow/wrist  2000  . intussception/obstruction  12/98  . JOINT REPLACEMENT     left shoulder  . kidney stone x2  1991  . laminectomy L4-5  1971  . LUMBAR LAMINECTOMY      1970 ruptured disc   . MOUTH SURGERY    . myoview stress  8/05   syncope (-)  . REVERSE SHOULDER ARTHROPLASTY Left 10/06/2014   Procedure: REVERSE SHOULDER ARTHROPLASTY;  Surgeon: Corky Mull, MD;  Location: [facility];  Service: Orthopedics;  Laterality: Left;  . stillbirth  1969  . TOTAL ABDOMINAL HYSTERECTOMY W/ BILATERAL SALPINGOOPHORECTOMY  1980   ovarian cancer  . WRIST FRACTURE SURGERY  11/05   R   Family History  Problem Relation Age of Onset  . Early death Father   . Diabetes Mother   . Heart disease Mother   . Arthritis Brother   . Cancer Brother   . Depression Brother   . Hearing loss Brother   . Arthritis Brother   . Cancer Brother        colon  . Hearing loss Brother   . Cancer Brother   . Diabetes Brother   . Hearing loss Brother   . Heart disease Brother   . Hyperlipidemia Brother   . Heart disease Sister   . Hypertension Sister   . Stroke Sister   . Hearing loss Daughter   . Hypertension Daughter   . Diabetes Paternal Grandfather   . Hearing loss Sister   . Heart disease Sister   . Arthritis Sister   . Depression Sister   . Hearing loss Sister   . Heart disease Sister   . COPD Brother   . Early death Brother   . Early death Brother    Social History   Socioeconomic History  . Marital status: Widowed    Spouse name: Not on file  . Number of children: 1  . Years of education: Not on file  . Highest education level: Not on file  Occupational History  . 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 Smoking status: Never Smoker  . Smokeless tobacco: Never Used  Vaping Use  . Vaping Use: Never used  Substance and Sexual Activity  . Alcohol use: No    Comment: may have occasional glass of wine  . Drug use: No  . Sexual activity: Not Currently  Other Topics Concern  . 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 Difficulty of Paying Living Expenses: Not on file  Food Insecurity:   . Worried About Charity fundraiser in the Last Year: Not on file  . Ran Out of Food in the Last Year: Not on file  Transportation Needs:   . Lack of Transportation (Medical): Not on file  . Lack of Transportation (Non-Medical): Not on file  Physical Activity:   . Days of Exercise per Week: Not on file  . Minutes of Exercise per Session: Not on file  Stress:   . Feeling of Stress : Not on file  Social Connections:   . Frequency of Communication with Friends and Family: Not on file  . Frequency of Social Gatherings with Friends and Family: Not on file  . Attends Religious Services: Not on file  . Active Member of Clubs or Organizations: Not on file  . Attends Archivist Meetings: Not on file  . Marital Status: Not on file  Intimate Partner Violence:   . Fear of Current or Ex-Partner: Not on file  . Emotionally Abused: Not on file  . Physically Abused: Not on file  . Sexually Abused: Not on file   Current Meds  Medication Sig  . acetaminophen (TYLENOL) 500 MG tablet Take 1,000 mg by mouth every 6 (six) hours as needed for moderate pain or headache.  . Ascorbic Acid (VITAMIN C) POWD Take 200 mg by mouth daily.   . bisoprolol (ZEBETA) 5 MG tablet Take 1 tablet (5 mg total) by mouth daily.  Marland Kitchen co-enzyme Q-10 30 MG capsule Take 30 mg by mouth daily.   Marland Kitchen estradiol (ESTRACE) 0.1 MG/GM vaginal cream APPLY 0.5MG (PEA SIZED AMOUNT) JUST INSIDE THE VAGINA WITH FINGER-TIP ON MONDAY, WEDNESDAY AND FRIDAY NIGHTS.  Marland Kitchen FOLIC FAOZ-H0-Q65-H PO Take by mouth.  . furosemide (LASIX) 20 MG tablet Take 1 tablet (20 mg total) by mouth 2 (two) times daily.  Marland Kitchen  glucose blood (ONE TOUCH ULTRA TEST) test strip Use to test blood sugar once daily E11.49 (Patient taking differently: Use to test blood sugar twice daily E11.49)  . levothyroxine (SYNTHROID, LEVOTHROID) 50 MCG tablet Take 50 mcg by mouth daily before breakfast.  . losartan (COZAAR) 25 MG tablet Take 0.5 tablets (12.5 mg total) by mouth daily. (Patient taking differently: Take 12.5 mg by mouth daily. Take if BP >110 sbp)  . metFORMIN (GLUCOPHAGE-XR) 500 MG 24 hr tablet Take 500 mg by mouth 2 (two) times daily.   . Multiple Vitamin (CALCIUM COMPLEX PO) Take 1 tablet by mouth daily.  . NON FORMULARY [place] Supplement B12 Takes qd. B6 qd, and folic acid  . Olopatadine HCl 0.2 % SOLN 1 drop once daily  . Omega-3 1000 MG CAPS Take 2,000 mg by mouth daily.   . potassium chloride (KLOR-CON) 10 MEQ tablet Take 1 tablet (10 mEq total) by mouth 2 (two) times daily.  . prednisoLONE acetate (PRED FORTE) 1 % ophthalmic suspension   . psyllium (METAMUCIL) 58.6 % packet Take 1 packet by mouth 2 (two) times daily.   Marland Kitchen SANTYL ointment Apply 1 application topically daily.  Alveda Reasons 20 MG TABS tablet Take 1 tablet (20 mg total) by mouth daily with supper.  . [DISCONTINUED] dicyclomine (BENTYL) 10 MG capsule Take 1 capsule (10 mg total) by mouth 4 (four) times daily - before meals and at bedtime.  . [  DISCONTINUED] linagliptin (TRADJENTA) 5 MG TABS tablet Take by mouth.   Allergies  Allergen Reactions  . Amiodarone Other (See Comments)    Severe Thyroid issues   . Penicillins Anaphylaxis and Other (See Comments)    Has patient had a PCN reaction causing immediate rash, facial/tongue/throat swelling, SOB or lightheadedness with hypotension: Yes Has patient had a PCN reaction causing severe rash involving mucus membranes or skin necrosis: No Has patient had a PCN reaction that required hospitalization: No Has patient had a PCN reaction occurring within the last 10 years: No If all of the above answers are "NO",  then may proceed with Cephalosporin use.   . Actos [Pioglitazone]     Not effective   . Amaryl [Glimepiride]     Not effective    . Atorvastatin Other (See Comments)    Muscle aches & All statins per Patient  . Bentyl [Dicyclomine Hcl]     Could not tolerate nausea, reduced concentration, h/a   . Ciprofloxacin Other (See Comments)    High blood pressure    . Codeine Nausea And Vomiting  . Ezetimibe-Simvastatin Other (See Comments)    Muscle aches, nausea, back pain   . Fosamax [Alendronate]     dysphagia  . Lovastatin Other (See Comments)    Myalgias  . Macrobid [Nitrofurantoin Macrocrystal]     Severe Itching, rash   . Protonix [Pantoprazole Sodium]     Esophageal problems    . Rosiglitazone Maleate Other (See Comments)    Edema  . Statins     Muscle and joint aches to all statins  . Victoza [Liraglutide]     Nausea   . Alendronate Sodium Rash    Dysphagia and ulceration   . Bactrim [Sulfamethoxazole-Trimethoprim] Rash    itching  . Sulfa Antibiotics Rash    Itching    Recent Results (from the past 2160 hour(s))  HM DIABETES EYE EXAM     Status: Abnormal   Collection Time: 03/09/20 12:00 AM  Result Value Ref Range   HM Diabetic Eye Exam Retinopathy (A) No Retinopathy    Comment: DM retinopathy no tx needed AE 03/09/20  Comprehensive metabolic panel     Status: Abnormal   Collection Time: 03/10/20  1:53 PM  Result Value Ref Range   Sodium 140 135 - 145 mEq/L   Potassium 4.3 3.5 - 5.1 mEq/L   Chloride 103 96 - 112 mEq/L   CO2 28 19 - 32 mEq/L   Glucose, Bld 131 (H) 70 - 99 mg/dL   BUN 16 6 - 23 mg/dL   Creatinine, Ser 0.65 0.40 - 1.20 mg/dL   Total Bilirubin 0.7 0.2 - 1.2 mg/dL   Alkaline Phosphatase 54 39 - 117 U/L   AST 20 0 - 37 U/L   ALT 6 0 - 35 U/L   Total Protein 7.2 6.0 - 8.3 g/dL   Albumin 4.1 3.5 - 5.2 g/dL   GFR 77.40 >60.00 mL/min    Comment: Calculated using the CKD-EPI Creatinine Equation (2021)   Calcium 9.1 8.4 - 10.5 mg/dL  Lipid  panel     Status: None   Collection Time: 03/10/20  1:53 PM  Result Value Ref Range   Cholesterol 171 0 - 200 mg/dL    Comment: ATP III Classification       Desirable:  < 200 mg/dL               Borderline High:  200 - 239 mg/dL  High:  > = 240 mg/dL   Triglycerides 79.0 0 - 149 mg/dL    Comment: Normal:  <150 mg/dLBorderline High:  150 - 199 mg/dL   HDL 67.50 >39.00 mg/dL   VLDL 15.8 0.0 - 40.0 mg/dL   LDL Cholesterol 87 0 - 99 mg/dL   Total CHOL/HDL Ratio 3     Comment:                Men          Women1/2 Average Risk     3.4          3.3Average Risk          5.0          4.42X Average Risk          9.6          7.13X Average Risk          15.0          11.0                       NonHDL 103.02     Comment: NOTE:  Non-HDL goal should be 30 mg/dL higher than patient's LDL goal (i.e. LDL goal of < 70 mg/dL, would have non-HDL goal of < 100 mg/dL)  CBC with Differential/Platelet     Status: Abnormal   Collection Time: 03/10/20  1:53 PM  Result Value Ref Range   WBC 6.2 4.0 - 10.5 K/uL   RBC 3.60 (L) 3.87 - 5.11 Mil/uL   Hemoglobin 12.1 12.0 - 15.0 g/dL   HCT 35.8 (L) 36 - 46 %   MCV 99.3 78.0 - 100.0 fl   MCHC 33.9 30.0 - 36.0 g/dL   RDW 14.2 11.5 - 15.5 %   Platelets 147.0 (L) 150 - 400 K/uL   Neutrophils Relative % 63.7 43 - 77 %   Lymphocytes Relative 27.8 12 - 46 %   Monocytes Relative 7.0 3 - 12 %   Eosinophils Relative 1.1 0 - 5 %   Basophils Relative 0.4 0 - 3 %   Neutro Abs 4.0 1.4 - 7.7 K/uL   Lymphs Abs 1.7 0.7 - 4.0 K/uL   Monocytes Absolute 0.4 0.1 - 1.0 K/uL   Eosinophils Absolute 0.1 0.0 - 0.7 K/uL   Basophils Absolute 0.0 0.0 - 0.1 K/uL  Urinalysis, Routine w reflex microscopic     Status: Abnormal   Collection Time: 03/10/20  1:53 PM  Result Value Ref Range   Color, Urine YELLOW YELLOW   APPearance CLOUDY (A) CLEAR   Specific Gravity, Urine 1.011 1.001 - 1.03   pH 5.5 5.0 - 8.0   Glucose, UA NEGATIVE NEGATIVE   Bilirubin Urine NEGATIVE NEGATIVE    Ketones, ur NEGATIVE NEGATIVE   Hgb urine dipstick 1+ (A) NEGATIVE   Protein, ur TRACE (A) NEGATIVE   Nitrite POSITIVE (A) NEGATIVE   Leukocytes,Ua 3+ (A) NEGATIVE   WBC, UA > OR = 60 (A) 0 - 5 /HPF   RBC / HPF 3-10 (A) 0 - 2 /HPF   Squamous Epithelial / LPF NONE SEEN < OR = 5 /HPF   Bacteria, UA MANY (A) NONE SEEN /HPF   Hyaline Cast NONE SEEN NONE SEEN /LPF  Microalbumin / creatinine urine ratio     Status: Abnormal   Collection Time: 03/10/20  1:53 PM  Result Value Ref Range   Creatinine, Urine 59 20 - 275 mg/dL   Microalb, Ur  4.3 mg/dL    Comment: Reference Range Not established    Microalb Creat Ratio 73 (H) <30 mcg/mg creat    Comment: . The ADA defines abnormalities in albumin excretion as follows: Marland Kitchen Albuminuria Category        Result (mcg/mg creatinine) . Normal to Mildly increased   <30 Moderately increased         30-299  Severely increased           > OR = 300 . The ADA recommends that at least two of three specimens collected within a 3-6 month period be abnormal before considering a patient to be within a diagnostic category.   TSH     Status: None   Collection Time: 03/10/20  1:53 PM  Result Value Ref Range   TSH 1.28 0.35 - 4.50 uIU/mL   Objective  Body mass index is 30.36 kg/m. Wt Readings from Last 3 Encounters:  03/10/20 171 lb 6.4 oz (77.7 kg)  01/22/20 169 lb (76.7 kg)  12/22/19 172 lb (78 kg)   Temp Readings from Last 3 Encounters:  03/10/20 98.7 F (37.1 C) (Oral)  09/29/19 (!) 96.8 F (36 C) (Temporal)  08/21/19 98.5 F (36.9 C) (Oral)   BP Readings from Last 3 Encounters:  03/10/20 116/80  01/22/20 (!) 141/81  12/22/19 (!) 154/84   Pulse Readings from Last 3 Encounters:  03/10/20 86  01/22/20 87  12/22/19 (!) 101    Physical Exam Vitals and nursing note reviewed.  Constitutional:      Appearance: Normal appearance. She is well-developed and well-groomed. She is obese.  HENT:     Head: Normocephalic and atraumatic.  Eyes:      Conjunctiva/sclera: Conjunctivae normal.     Pupils: Pupils are equal, round, and reactive to light.  Cardiovascular:     Rate and Rhythm: Normal rate and regular rhythm.     Heart sounds: Normal heart sounds. No murmur heard.   Pulmonary:     Effort: Pulmonary effort is normal.     Breath sounds: Normal breath sounds.  Musculoskeletal:     Right lower leg: 2+ Pitting Edema present.     Left lower leg: 2+ Pitting Edema present.  Skin:    General: Skin is warm and dry.  Neurological:     General: No focal deficit present.     Mental Status: She is alert and oriented to person, place, and time. Mental status is at baseline.     Gait: Gait normal.  Psychiatric:        Attention and Perception: Attention and perception normal.        Mood and Affect: Mood and affect normal.        Speech: Speech normal.        Behavior: Behavior normal. Behavior is cooperative.        Thought Content: Thought content normal.        Cognition and Memory: Cognition and memory normal.        Judgment: Judgment normal.     Assessment  Plan  Hypertension associated with diabetes ([place]) controlled 03/04/20 A1C 7.2 BP controlled - Plan: Comprehensive metabolic panel, Lipid panel, CBC with Differential/Platelet, Urinalysis, Routine w reflex microscopic, Microalbumin / creatinine urine ratio, Ambulatory referral to [facility] metfomin xr 500 mg bid and off tradjenta 5 mg qd  BP on losartan 25 mg if BP >110 and zebeta 5 mg qd lasix 20 mg bid    Hypothyroidism, unspecified type - Plan:  TSH Controlled on levo 50 mcg qd  Both DM and thyroid d/o f/u [place] endocrine   Lymphedema Consider lymphedema clinic though pt reports transportation issues  Overactive bladder Bathroom schedule Q3-4 hrs while awake   Dysphagia, unspecified type F/u [name] pt will reach out   Obesity (BMI 30-39.9)  rec healthy diet and exercise    Abnormal gait needs hospital bed s/p kyphoplasty due to  recent spinal fractures total hospital electric bed  Pt requires re positioning of the body in ways that cant be achieve with ordinary bed or wedge pillow, to eliminate pain, reduce pressure, treat existence of pressure sores, pt has difficulty with mobility due to weakness and physically unable to move out of bed and lives alone and needs assistance to reposition to get out of bed. The full electric hospital bed will assist with this  HM Fasting labs today Flu shot utd 03/04/20 prevnar had 03/03/16  covid vx had 3/3 pfizer  Consider pna 23 vaccine and shingrix vaccine in the future at [place] for pna 23had 2005 per pt stated [facility]advised to look for copy of proof if not will need pna 23 Tdaputd zostervax had   Declines check MMR Reviewed B12 lab normal 957 06/28/17  Out of age window:  mammo Paps/p hysterectomy Colonoscopy  Declines DEXA  She is established with dermatology h/o BCCDr. Istenstein [facility]  05/01/2018 Dr. Roland Rack left CTS left f/u CTS release doing betterbut 12/26/18 c/w left hand numbness  specalists cards Dr. Krystal Eaton- Endocrine [facility] Dr. Honor Junes [facility]  -->of note with h/o CAD, atherosclerosis consider US carotid in future    Provider: Dr. [name]-Internal Medicine

## 2020-03-10 NOTE — Patient Instructions (Addendum)
Consider lymphedema clinic with PT and massage  Call [facility] and see if can get help with transportation   Pharmacist Catie will call you     Semaglutide injection solution What is this medicine? SEMAGLUTIDE (Sem a GLOO tide) is used to improve blood sugar control in adults with type 2 diabetes. This medicine may be used with other diabetes medicines. This drug may also reduce the risk of heart attack or stroke if you have type 2 diabetes and risk factors for heart disease. This medicine may be used for other purposes; ask your health care provider or pharmacist if you have questions. COMMON BRAND NAME(S): OZEMPIC What should I tell my health care provider before I take this medicine? They need to know if you have any of these conditions:  endocrine tumors (MEN 2) or if someone in your family had these tumors  eye disease, vision problems  history of pancreatitis  kidney disease  stomach problems  thyroid cancer or if someone in your family had thyroid cancer  an unusual or allergic reaction to semaglutide, other medicines, foods, dyes, or preservatives  pregnant or trying to get pregnant  breast-feeding How should I use this medicine? This medicine is for injection under the skin of your upper leg (thigh), stomach area, or upper arm. It is given once every week (every 7 days). You will be taught how to prepare and give this medicine. Use exactly as directed. Take your medicine at regular intervals. Do not take it more often than directed. If you use this medicine with insulin, you should inject this medicine and the insulin separately. Do not mix them together. Do not give the injections right next to each other. Change (rotate) injection sites with each injection. It is important that you put your used needles and syringes in a special sharps container. Do not put them in a trash can. If you do not have a sharps container, call your pharmacist or healthcare provider to get  one. A special MedGuide will be given to you by the pharmacist with each prescription and refill. Be sure to read this information carefully each time. This drug comes with INSTRUCTIONS FOR USE. Ask your pharmacist for directions on how to use this drug. Read the information carefully. Talk to your pharmacist or health care provider if you have questions. Talk to your pediatrician regarding the use of this medicine in children. Special care may be needed. Overdosage: If you think you have taken too much of this medicine contact a poison control center or emergency room at once. NOTE: This medicine is only for you. Do not share this medicine with others. What if I miss a dose? If you miss a dose, take it as soon as you can within 5 days after the missed dose. Then take your next dose at your regular weekly time. If it has been longer than 5 days after the missed dose, do not take the missed dose. Take the next dose at your regular time. Do not take double or extra doses. If you have questions about a missed dose, contact your health care provider for advice. What may interact with this medicine?  other medicines for diabetes Many medications may cause changes in blood sugar, these include:  alcohol containing beverages  antiviral medicines for HIV or AIDS  aspirin and aspirin-like drugs  certain medicines for blood pressure, heart disease, irregular heart beat  chromium  diuretics  female hormones, such as estrogens or progestins, birth control pills  fenofibrate  gemfibrozil  isoniazid  lanreotide  female hormones or anabolic steroids  MAOIs like Carbex, Eldepryl, Marplan, Nardil, and Parnate  medicines for weight loss  medicines for allergies, asthma, cold, or cough  medicines for depression, anxiety, or psychotic disturbances  niacin  nicotine  NSAIDs, medicines for pain and inflammation, like ibuprofen or  naproxen  octreotide  pasireotide  pentamidine  phenytoin  probenecid  quinolone antibiotics such as ciprofloxacin, levofloxacin, ofloxacin  some herbal dietary supplements  steroid medicines such as prednisone or cortisone  sulfamethoxazole; trimethoprim  thyroid hormones Some medications can hide the warning symptoms of low blood sugar (hypoglycemia). You may need to monitor your blood sugar more closely if you are taking one of these medications. These include:  beta-blockers, often used for high blood pressure or heart problems (examples include atenolol, metoprolol, propranolol)  clonidine  guanethidine  reserpine This list may not describe all possible interactions. Give your health care provider a list of all the medicines, herbs, non-prescription drugs, or dietary supplements you use. Also tell them if you smoke, drink alcohol, or use illegal drugs. Some items may interact with your medicine. What should I watch for while using this medicine? Visit your doctor or health care professional for regular checks on your progress. Drink plenty of fluids while taking this medicine. Check with your doctor or health care professional if you get an attack of severe diarrhea, nausea, and vomiting. The loss of too much body fluid can make it dangerous for you to take this medicine. A test called the HbA1C (A1C) will be monitored. This is a simple blood test. It measures your blood sugar control over the last 2 to 3 months. You will receive this test every 3 to 6 months. Learn how to check your blood sugar. Learn the symptoms of low and high blood sugar and how to manage them. Always carry a quick-source of sugar with you in case you have symptoms of low blood sugar. Examples include hard sugar candy or glucose tablets. Make sure others know that you can choke if you eat or drink when you develop serious symptoms of low blood sugar, such as seizures or unconsciousness. They must get  medical help at once. Tell your doctor or health care professional if you have high blood sugar. You might need to change the dose of your medicine. If you are sick or exercising more than usual, you might need to change the dose of your medicine. Do not skip meals. Ask your doctor or health care professional if you should avoid alcohol. Many nonprescription cough and cold products contain sugar or alcohol. These can affect blood sugar. Pens should never be shared. Even if the needle is changed, sharing may result in passing of viruses like hepatitis or HIV. Wear a medical ID bracelet or chain, and carry a card that describes your disease and details of your medicine and dosage times. Do not become pregnant while taking this medicine. Women should inform their doctor if they wish to become pregnant or think they might be pregnant. There is a potential for serious side effects to an unborn child. Talk to your health care professional or pharmacist for more information. What side effects may I notice from receiving this medicine? Side effects that you should report to your doctor or health care professional as soon as possible:  allergic reactions like skin rash, itching or hives, swelling of the face, lips, or tongue  breathing problems  changes in vision  diarrhea that  continues or is severe  lump or swelling on the neck  severe nausea  signs and symptoms of infection like fever or chills; cough; sore throat; pain or trouble passing urine  signs and symptoms of low blood sugar such as feeling anxious, confusion, dizziness, increased hunger, unusually weak or tired, sweating, shakiness, cold, irritable, headache, blurred vision, fast heartbeat, loss of consciousness  signs and symptoms of kidney injury like trouble passing urine or change in the amount of urine  trouble swallowing  unusual stomach upset or pain  vomiting Side effects that usually do not require medical attention  (report to your doctor or health care professional if they continue or are bothersome):  constipation  diarrhea  nausea  pain, redness, or irritation at site where injected  stomach upset This list may not describe all possible side effects. Call your doctor for medical advice about side effects. You may report side effects to FDA at 1-800-FDA-1088. Where should I keep my medicine? Keep out of the reach of children. Store unopened pens in a refrigerator between 2 and 8 degrees C (36 and 46 degrees F). Do not freeze. Protect from light and heat. After you first use the pen, it can be stored for 56 days at room temperature between 15 and 30 degrees C (59 and 86 degrees F) or in a refrigerator. Throw away your used pen after 56 days or after the expiration date, whichever comes first. Do not store your pen with the needle attached. If the needle is left on, medicine may leak from the pen. NOTE: This sheet is a summary. It may not cover all possible information. If you have questions about this medicine, talk to your doctor, pharmacist, or health care provider.  2020 Elsevier/Gold Standard (2018-12-24 09:41:51)  Semaglutide oral tablets What is this medicine? SEMAGLUTIDE (Sem a GLOO tide) is used to improve blood sugar control in adults with type 2 diabetes. This medicine may be used with other diabetes medicines. This medicine may be used for other purposes; ask your health care provider or pharmacist if you have questions. COMMON BRAND NAME(S): Rybelsus What should I tell my health care provider before I take this medicine? They need to know if you have any of these conditions:  endocrine tumors (MEN 2) or if someone in your family had these tumors  eye disease, vision problems  history of pancreatitis  kidney disease  stomach problems  thyroid cancer or if someone in your family had thyroid cancer  an unusual or allergic reaction to semaglutide, other medicines, foods, dyes, or  preservatives  pregnant or trying to get pregnant  breast-feeding How should I use this medicine? Take this medicine by mouth with a glass of plain water that is less than 4 ounces (less than 120 mL). Follow the directions on the prescription label. Do not cut, crush or chew this medicine. Swallow the tablets whole. Take at least 30 minutes before the first food, other beverage, or other oral medications of the day. Take your medicine at regular intervals. Do not take your medicine more often than directed. Do not stop taking except on your doctor's advice. A special MedGuide will be given to you by the pharmacist with each prescription and refill. Be sure to read this information carefully each time. Talk to your pediatrician regarding the use of this medicine in children. Special care may be needed. Overdosage: If you think you have taken too much of this medicine contact a poison control center or emergency room  at once. NOTE: This medicine is only for you. Do not share this medicine with others. What if I miss a dose? If you miss a dose, skip it. Take your next dose at the normal time. Do not take extra or 2 doses at the same time to make up for the missed dose. What may interact with this medicine? What may interact with this medicine?  aminophylline  carbamazepine  cyclosporine  digoxin  levothyroxine  other medicines for diabetes  phenytoin  tacrolimus  theophylline  warfarin Many medications may cause changes in blood sugar, these include:  alcohol containing beverages  antiviral medicines for HIV or AIDS  aspirin and aspirin-like drugs  certain medicines for blood pressure, heart disease, irregular heart beat  chromium  diuretics  female hormones, such as estrogens or progestins, birth control pills  fenofibrate  gemfibrozil  isoniazid  lanreotide  female hormones or anabolic steroids  MAOIs like Carbex, Eldepryl, Marplan, Nardil, and  Parnate  medicines for weight loss  medicines for allergies, asthma, cold, or cough  medicines for depression, anxiety, or psychotic disturbances  niacin  nicotine  NSAIDs, medicines for pain and inflammation, like ibuprofen or naproxen  octreotide  pasireotide  pentamidine  phenytoin  probenecid  quinolone antibiotics such as ciprofloxacin, levofloxacin, ofloxacin  some herbal dietary supplements  steroid medicines such as prednisone or cortisone  sulfamethoxazole; trimethoprim  thyroid hormones Some medications can hide the warning symptoms of low blood sugar (hypoglycemia). You may need to monitor your blood sugar more closely if you are taking one of these medications. These include:  beta-blockers, often used for high blood pressure or heart problems (examples include atenolol, metoprolol, propranolol)  clonidine  guanethidine  reserpine This list may not describe all possible interactions. Give your health care provider a list of all the medicines, herbs, non-prescription drugs, or dietary supplements you use. Also tell them if you smoke, drink alcohol, or use illegal drugs. Some items may interact with your medicine. What should I watch for while using this medicine? Visit your doctor or health care professional for regular checks on your progress. Drink plenty of fluids while taking this medicine. Check with your doctor or health care professional if you get an attack of severe diarrhea, nausea, and vomiting. The loss of too much body fluid can make it dangerous for you to take this medicine. A test called the HbA1C (A1C) will be monitored. This is a simple blood test. It measures your blood sugar control over the last 2 to 3 months. You will receive this test every 3 to 6 months. Learn how to check your blood sugar. Learn the symptoms of low and high blood sugar and how to manage them. Always carry a quick-source of sugar with you in case you have symptoms of  low blood sugar. Examples include hard sugar candy or glucose tablets. Make sure others know that you can choke if you eat or drink when you develop serious symptoms of low blood sugar, such as seizures or unconsciousness. They must get medical help at once. Tell your doctor or health care professional if you have high blood sugar. You might need to change the dose of your medicine. If you are sick or exercising more than usual, you might need to change the dose of your medicine. Do not skip meals. Ask your doctor or health care professional if you should avoid alcohol. Many nonprescription cough and cold products contain sugar or alcohol. These can affect blood sugar. Wear a medical  ID bracelet or chain, and carry a card that describes your disease and details of your medicine and dosage times. Do not become pregnant while taking this medicine. Women should inform their doctor if they wish to become pregnant or think they might be pregnant. There is a potential for serious side effects to an unborn child. Talk to your health care professional or pharmacist for more information. Do not breast-feed an infant while taking this medicine. What side effects may I notice from receiving this medicine? Side effects that you should report to your doctor or health care professional as soon as possible:  allergic reactions like skin rash, itching or hives, swelling of the face, lips, or tongue  breathing problems  changes in vision  diarrhea that continues or is severe  lump or swelling on the neck  severe nausea  signs and symptoms of infection like fever or chills; cough; sore throat; pain or trouble passing urine  signs and symptoms of low blood sugar such as feeling anxious, confusion, dizziness, increased hunger, unusually weak or tired, sweating, shakiness, cold, irritable, headache, blurred vision, fast heartbeat, loss of consciousness  signs and symptoms of kidney injury like trouble passing  urine or change in the amount of urine  trouble swallowing  unusual stomach upset or pain  vomiting Side effects that usually do not require medical attention (report these to your doctor or health care professional if they continue or are bothersome):  constipation  diarrhea  nausea  stomach upset This list may not describe all possible side effects. Call your doctor for medical advice about side effects. You may report side effects to FDA at 1-800-FDA-1088. Where should I keep my medicine? Keep out of the reach of children. Store at room temperature between 15 and 30 degrees C (59 and 86 degrees F). Keep this medicine in the original blister card until use. Keep in a dry place. Throw away any unused medicine after the expiration date. NOTE: This sheet is a summary. It may not cover all possible information. If you have questions about this medicine, talk to your doctor, pharmacist, or health care provider.  2020 Elsevier/Gold Standard (2018-01-15 10:07:43)  Lymphedema  Lymphedema is swelling that is caused by the abnormal collection of lymph in the tissues under the skin. Lymph is fluid from the tissues in your body that is removed through the lymphatic system. This system is part of your body's defense system (immune system) and includes lymph nodes and lymph vessels. The lymph vessels collect and carry the excess fluid, fats, proteins, and wastes from the tissues of the body to the bloodstream. This system also works to clean and remove bacteria and waste products from the body. Lymphedema occurs when the lymphatic system is blocked. When the lymph vessels or lymph nodes are blocked or damaged, lymph does not drain properly. This causes an abnormal buildup of lymph, which leads to swelling in the affected area. This may include the trunk area, or an arm or leg. Lymphedema cannot be cured by medicines, but various methods can be used to help reduce the swelling. There are two types of  lymphedema: primary lymphedema and secondary lymphedema. What are the causes? The cause of this condition depends on the type of lymphedema that you have.  Primary lymphedema is caused by the absence of lymph vessels or having abnormal lymph vessels at birth.  Secondary lymphedema occurs when lymph vessels are blocked or damaged. Secondary lymphedema is more common. Common causes of lymph vessel blockage  include: ? Skin infection, such as cellulitis. ? Infection by parasites (filariasis). ? Injury. ? Radiation therapy. ? Cancer. ? Formation of scar tissue. ? Surgery. What are the signs or symptoms? Symptoms of this condition include:  Swelling of the arm or leg.  A heavy or tight feeling in the arm or leg.  Swelling of the feet, toes, or fingers. Shoes or rings may fit more tightly than before.  Redness of the skin over the affected area.  Limited movement of the affected limb.  Sensitivity to touch or discomfort in the affected limb. How is this diagnosed? This condition may be diagnosed based on:  Your symptoms and medical history.  A physical exam.  Bioimpedance spectroscopy. In this test, painless electrical currents are used to measure fluid levels in your body.  Imaging tests, such as: ? Lymphoscintigraphy. In this test, a low dose of a radioactive substance is injected to trace the flow of lymph through the lymph vessels. ? MRI. ? CT scan. ? Duplex ultrasound. This test uses sound waves to produce images of the vessels and the blood flow on a screen. ? Lymphangiography. In this test, a contrast dye is injected into the lymph vessel to help show blockages. How is this treated? Treatment for this condition may depend on the cause of your lymphedema. Treatment may include:  Complete decongestive therapy (CDT). This is done by a certified lymphedema therapist to reduce fluid congestion. This therapy includes: ? Manual lymph drainage. This is a special massage  technique that promotes lymph drainage out of a limb. ? Skin care. ? Compression wrapping of the affected area. ? Specific exercises. Certain exercises can help fluid move out of the affected limb.  Compression. Various methods may be used to apply pressure to the affected limb to reduce the swelling. They include: ? Wearing compression stockings or sleeves on the affected limb. ? Wrapping the affected limb with special bandages.  Surgery. This is usually done for severe cases only. For example, surgery may be done if you have trouble moving the limb or if the swelling does not get better with other treatments. If an underlying condition is causing the lymphedema, treatment for that condition will be done. For example, antibiotic medicines may be used to treat an infection. Follow these instructions at home: Self-care  The affected area is more likely to become injured or infected. Take these steps to help prevent infection: ? Keep the affected area clean and dry. ? Use approved creams or lotions to keep the skin moisturized. ? Protect your skin from cuts:  Use gloves while cooking or gardening.  Do not walk barefoot.  If you shave the affected area, use an Copy.  Do not wear tight clothes, shoes, or jewelry.  Eat a healthy diet that includes a lot of fruits and vegetables. Activity  Exercise regularly as directed by your health care provider.  Do not sit with your legs crossed.  When possible, keep the affected limb raised (elevated) above the level of your heart.  Avoid carrying things with an arm that is affected by lymphedema. General instructions  Wear compression stockings or sleeves as told by your health care provider.  Note any changes in size of the affected limb. You may be instructed to take regular measurements and keep track of them.  Take over-the-counter and prescription medicines only as told by your health care provider.  If you were prescribed  an antibiotic medicine, take or apply it as told by your  health care provider. Do not stop using the antibiotic even if you start to feel better.  Do not use heating pads or ice packs over the affected area.  Avoid having blood draws, IV insertions, or blood pressure checked on the affected limb.  Keep all follow-up visits as told by your health care provider. This is important. Contact a health care provider if you:  Continue to have swelling in your limb.  Have a cut that does not heal.  Have redness or pain in the affected area. Get help right away if you:  Have new swelling in your limb that comes on suddenly.  Develop purplish spots, rash or sores (lesions) on your affected limb.  Have shortness of breath.  Have a fever or chills. Summary  Lymphedema is swelling that is caused by the abnormal collection of lymph in the tissues under the skin.  Lymph is fluid from the tissues in your body that is removed through the lymphatic system. This system collects and carries excess fluid, fats, proteins, and wastes from the tissues of the body to the bloodstream.  Lymphedema causes swelling, pain, and redness in the affected area. This may include the trunk area, or an arm or leg.  Treatment for this condition may depend on the cause of your lymphedema. Treatment may include complete decongestive therapy (CDT), compression methods, surgery, or treating the underlying cause. This information is not intended to replace advice given to you by your health care provider. Make sure you discuss any questions you have with your health care provider. Document Revised: 04/23/2017 Document Reviewed: 04/23/2017 Elsevier Patient Education  2020 [employer].

## 2020-03-11 LAB — URINALYSIS, ROUTINE W REFLEX MICROSCOPIC
Bilirubin Urine: NEGATIVE
Glucose, UA: NEGATIVE
Hyaline Cast: NONE SEEN /LPF
Ketones, ur: NEGATIVE
Nitrite: POSITIVE — AB
Specific Gravity, Urine: 1.011 (ref 1.001–1.03)
Squamous Epithelial / HPF: NONE SEEN /HPF (ref <=5)
WBC, UA: >=60 /HPF — AB (ref 0–5)
pH: 5.5 (ref 5.0–8.0)

## 2020-03-11 LAB — MICROALBUMIN / CREATININE URINE RATIO
Creatinine, Urine: 59 mg/dL (ref 20–275)
Microalb Creat Ratio: 73 mcg/mg creat — ABNORMAL HIGH (ref <30)
Microalb, Ur: 4.3 mg/dL

## 2020-03-12 NOTE — Progress Notes (Signed)
I Cardiology Office Note  Date:  03/15/2020   ID:  Jodi Reeves, DOB Feb 26, 1928, MRN [id number]  PCP:  [name], Nino Glow, MD   Chief Complaint  Patient presents with  . 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  CT chest 04/20/2016, Calcifications of the aortic arch, mild calcification of the coronary arteries. Moderate calcifications of the abdominal aorta, which is tortuous. Previous stretching of esophagus with Dr. Candace Cruise Chronic leg swelling, venous/lymphedema She presents for follow-up of her atrial fibrillation and chest pain, leg swelling  Presents in wheelchair on today's visit Last seen in clinic by myself May 2021  Using lymphedema pumps daily  Previously treated with unna wraps  Presents in a wheelchair Lives alone  No chest pain, chronic shortness of breath with exertion  Takes lasix 20 daily up to twice a day, with potassium Overactive bladder  Weight at home 166 pounds Still on Xarelto  Labs  HBA1C 6.9 Total chol 171, LDL 87 CR 0.65, BUN 16  echo 10/2018 RVSP 47 mm Hg, EF >60% HBA1C 7.4 CR 0.79  EKG personally reviewed by myself on todays visit Atrial fibrillation rate 70   Previous  falls , Went to [facility], , Sacral fx Left face bruising, severe, Xarelto held Did not qualify for placement/SNF   CT scan chest March 18, 2017 with cardiomegaly and coronary artery calcification, aortic atherosclerosis  Chronic GI issues Previously had diarrhea, now with constipation. Working with GI  CT chest 04/20/2016: Calcifications of the aortic arch. mild  calcification of the coronary arteries. Moderate calcifications of the abdominal aorta, which is tortuous. The origins of the celiac, SMA, single bilateral renal arteries are patent. The origin of the IMA is not seen, and may be occluded chronically.  TIA in January 2017, went to [facility] for evaluation She was talking with a friend, had word finding difficulty, friend was finishing her sentences Workup at [facility] reviewed She had CT scan of her head which was essentially normal,  carotid ultrasound that showed mild bilateral disease, no significant stenoses,  echocardiogram with ejection fraction greater than 65%, mild valve abnormality   PMH:   has a past medical history of Allergy, Anxiety, Balance disorder, Chronic diastolic CHF (congestive heart failure) ([place]), Colon polyps, Coronary artery disease, non-occlusive, DM2 (diabetes mellitus, type 2) ([place]), Fall, Falls frequently, GERD (gastroesophageal reflux disease), Granuloma annulare, History of chicken pox, History of eating disorder, HLD (hyperlipidemia), HTN (hypertension), Hypothyroidism, IBS (irritable bowel syndrome), Incontinence of bowel, Osteoporosis, Ovarian cancer ([place]), Persistent atrial fibrillation ([place]), SBO (small bowel obstruction) ([place]), TIA (transient ischemic attack), UTI (urinary tract infection), and Venous insufficiency.  PSH:    Past Surgical History:  Procedure Laterality Date  . 2nd look laparotomy  1982  . ABDOMINAL HYSTERECTOMY     for ovarian cancer s/p hysterectomy total 1976 and exp lap 1980  . APPENDECTOMY    . CARDIAC CATHETERIZATION  8/05   neg; a fib found   . CARPAL TUNNEL RELEASE Left 03/06/2018   Procedure: CARPAL TUNNEL RELEASE ENDOSCOPIC;  Surgeon: Corky Mull, MD;  Location: [place];  Service: Orthopedics;  Laterality: Left;  diabetic - oral meds  . CARPAL TUNNEL RELEASE     left CTS Dr.Poggi  . cataract OD  2002  . cataract surgery  5/07   R  . CHOLECYSTECTOMY  1986  . DEXA  9/04 and  1/02  . EGD/dilation/colon  1/06  . ESOPHAGOGASTRODUODENOSCOPY (EGD) WITH PROPOFOL N/A 05/16/2016   Procedure: ESOPHAGOGASTRODUODENOSCOPY (EGD) WITH PROPOFOL with dilation;  Surgeon: Lucilla Lame, MD;  Location: [facility];  Service: Endoscopy;  Laterality: N/A;  . FEMORAL HERNIA REPAIR  1958   R  . fracture L elbow/wrist  2000  . intussception/obstruction  12/98  . JOINT REPLACEMENT     left shoulder  . kidney stone x2  1991  . laminectomy L4-5  1971  . LUMBAR LAMINECTOMY     1970 ruptured disc   . MOUTH SURGERY    . myoview stress  8/05   syncope (-)  . REVERSE SHOULDER ARTHROPLASTY Left 10/06/2014   Procedure: REVERSE SHOULDER ARTHROPLASTY;  Surgeon: Corky Mull, MD;  Location: [facility] ORS;  Service: Orthopedics;  Laterality: Left;  . stillbirth  1969  . TOTAL ABDOMINAL HYSTERECTOMY W/ BILATERAL SALPINGOOPHORECTOMY  1980   ovarian cancer  . WRIST FRACTURE SURGERY  11/05   R    Current Outpatient Medications  Medication Sig Dispense Refill  . acetaminophen (TYLENOL) 500 MG tablet Take 1,000 mg by mouth every 6 (six) hours as needed for moderate pain or headache.    . Ascorbic Acid (VITAMIN C) POWD Take 200 mg by mouth daily.     . bisoprolol (ZEBETA) 5 MG tablet Take 1 tablet (5 mg total) by mouth daily. 90 tablet 0  . co-enzyme Q-10 30 MG capsule Take 30 mg by mouth daily.     Marland Kitchen estradiol (ESTRACE) 0.1 MG/GM vaginal cream APPLY 0.5MG  (PEA SIZED AMOUNT) JUST INSIDE THE VAGINA WITH FINGER-TIP ON MONDAY, WEDNESDAY AND FRIDAY NIGHTS. 42.5 g 0  . furosemide (LASIX) 20 MG tablet Take 1 tablet (20 mg total) by mouth 2 (two) times daily. 190 tablet 3  . glucose blood (ONE TOUCH ULTRA TEST) test strip Use to test blood sugar once daily E11.49 (Patient taking differently: Use to test blood sugar twice daily E11.49) 100 each 1  . levothyroxine (SYNTHROID, LEVOTHROID) 50 MCG tablet Take 50 mcg by mouth daily before breakfast.    . losartan (COZAAR) 25 MG tablet Take 0.5 tablets (12.5 mg  total) by mouth daily. (Patient taking differently: Take 12.5 mg by mouth daily. Take if BP >110 sbp) 45 tablet 0  . metFORMIN (GLUCOPHAGE-XR) 500 MG 24 hr tablet Take 500 mg by mouth 2 (two) times daily.     . Multiple Vitamin (CALCIUM COMPLEX PO) Take 1 tablet by mouth daily.    . NON FORMULARY [place] Supplement B12 Takes qd. B6 qd, and folic acid    . Olopatadine HCl 0.2 % SOLN 1 drop once daily    . Omega-3 1000 MG CAPS Take 2,000 mg by mouth daily.     . potassium chloride (KLOR-CON) 10 MEQ tablet Take 1 tablet (10 mEq total) by mouth 2 (two) times daily. 180 tablet 3  . prednisoLONE acetate (PRED FORTE) 1 % ophthalmic suspension     . psyllium (METAMUCIL) 58.6 % packet Take 1 packet by mouth daily.     Marland Kitchen SANTYL ointment Apply 1 application topically daily. 30 g 1  . XARELTO 20 MG TABS tablet Take 1 tablet (20 mg total) by mouth daily with supper. 30 tablet  6   No current facility-administered medications for this visit.    Allergies:   Amiodarone, Penicillins, Actos [pioglitazone], Amaryl [glimepiride], Atorvastatin, Bentyl [dicyclomine hcl], Ciprofloxacin, Codeine, Ezetimibe-simvastatin, Fosamax [alendronate], Lovastatin, Macrobid [nitrofurantoin macrocrystal], Protonix [pantoprazole sodium], Rosiglitazone maleate, Statins, Victoza [liraglutide], Alendronate sodium, Bactrim [sulfamethoxazole-trimethoprim], and Sulfa antibiotics   Social History:  The patient  reports that she has never smoked. She has never used smokeless tobacco. She reports that she does not drink alcohol and does not use drugs.   Family History:   family history includes Arthritis in her brother, brother, and sister; COPD in her brother; Cancer in her brother, brother, and brother; Depression in her brother and sister; Diabetes in her brother, mother, and paternal grandfather; Early death in her brother, brother, and father; Hearing loss in her brother, brother, brother, daughter, sister, and sister; Heart disease in  her brother, mother, sister, sister, and sister; Hyperlipidemia in her brother; Hypertension in her daughter and sister; Stroke in her sister.    Review of Systems: Review of Systems  Constitutional: Negative.   HENT: Negative.   Respiratory: Negative.   Cardiovascular: Positive for leg swelling.  Gastrointestinal: Negative.   Musculoskeletal: Negative.        Unsteady gait  Neurological: Negative.   All other systems reviewed and are negative.   PHYSICAL EXAM: VS:  BP 138/78   Pulse 70   Ht 5\' 3"  (1.6 m)   Wt 173 lb (78.5 kg)   BMI 30.65 kg/m  , BMI Body mass index is 30.65 kg/m. Constitutional:  oriented to person, place, and time. No distress.  HENT:  Head: Grossly normal Eyes:  no discharge. No scleral icterus.  Neck: No JVD, no carotid bruits  Cardiovascular: Irregularly irregular, no murmurs appreciated Massive swelling of legs bilaterally to the knees No compression hose on Pulmonary/Chest: Clear to auscultation bilaterally, no wheezes or rails Abdominal: Soft.  no distension.  no tenderness.  Musculoskeletal: Normal range of motion Neurological:  normal muscle tone. Coordination normal. No atrophy Skin: Skin warm and dry Psychiatric: normal affect, pleasant  Recent Labs: 03/10/2020: ALT 6; BUN 16; Creatinine, Ser 0.65; Hemoglobin 12.1; Platelets 147.0; Potassium 4.3; Sodium 140; TSH 1.28    Lipid Panel Lab Results  Component Value Date   CHOL 171 03/10/2020   HDL 67.50 03/10/2020   LDLCALC 87 03/10/2020   TRIG 79.0 03/10/2020      Wt Readings from Last 3 Encounters:  03/15/20 173 lb (78.5 kg)  03/10/20 171 lb 6.4 oz (77.7 kg)  01/22/20 169 lb (76.7 kg)     ASSESSMENT AND PLAN:  Pulmonary hypertension Prior Echocardiogram right heart pressures approximately 47 mmHg Weight stable, Stay on lasix 40 daily  Mixed hyperlipidemia  CT scan including mild coronary calcifications, aortic arch calcifications, moderate aortic atherosclerosis in the  abdominal aorta  does not want a statin   Lymphedema: Compression pumps twice a day, 1 hr Leg elevation Does not want to come into the hospital for lymphedema massage given transportation issues  Essential hypertension, benign - Plan: EKG 12-Lead Blood pressure is well controlled on today's visit. No changes made to the medications.  Persistent atrial fibrillation (HCC) Tolerating Xarelto, no recent falls Rate controlled  Chronic diastolic CHF (congestive heart failure) (HCC) Weight stable, chronic leg swelling (venous insuff/lymphedema) On lasix 40 daily, no changes, normal BMP  Diabetes: Followed by endocrine, numbers doing better  Lymphedema Followed by Dr. Ronalee Belts On the pumps BID, severe but stable, no open wounds   Total encounter time  more than 35 minutes  Greater than 50% was spent in counseling and coordination of care with the patient   No orders of the defined types were placed in this encounter.   Signed, Esmond Plants, M.D., Ph.D. 03/15/2020  [place], [place]

## 2020-03-15 ENCOUNTER — Telehealth: Payer: Self-pay

## 2020-03-15 ENCOUNTER — Ambulatory Visit: Payer: Medicare HMO | Admitting: Cardiovascular Disease

## 2020-03-15 ENCOUNTER — Other Ambulatory Visit: Payer: Self-pay

## 2020-03-15 ENCOUNTER — Telehealth: Payer: Self-pay | Admitting: Urology

## 2020-03-15 ENCOUNTER — Encounter: Payer: Self-pay | Admitting: Cardiovascular Disease

## 2020-03-15 VITALS — BP 138/78 | HR 70 | Ht 63.0 in | Wt 173.0 lb

## 2020-03-15 DIAGNOSIS — I5032 Chronic diastolic (congestive) heart failure: Secondary | ICD-10-CM

## 2020-03-15 DIAGNOSIS — I152 Hypertension secondary to endocrine disorders: Secondary | ICD-10-CM | POA: Insufficient documentation

## 2020-03-15 DIAGNOSIS — I700 Atherosclerosis of aorta: Secondary | ICD-10-CM

## 2020-03-15 DIAGNOSIS — I2510 Atherosclerotic heart disease of native coronary artery without angina pectoris: Secondary | ICD-10-CM

## 2020-03-15 DIAGNOSIS — I4819 Other persistent atrial fibrillation: Secondary | ICD-10-CM | POA: Diagnosis not present

## 2020-03-15 DIAGNOSIS — I10 Essential (primary) hypertension: Secondary | ICD-10-CM | POA: Diagnosis not present

## 2020-03-15 DIAGNOSIS — E669 Obesity, unspecified: Secondary | ICD-10-CM | POA: Insufficient documentation

## 2020-03-15 DIAGNOSIS — I272 Pulmonary hypertension, unspecified: Secondary | ICD-10-CM

## 2020-03-15 DIAGNOSIS — R079 Chest pain, unspecified: Secondary | ICD-10-CM

## 2020-03-15 DIAGNOSIS — R600 Localized edema: Secondary | ICD-10-CM

## 2020-03-15 DIAGNOSIS — E1149 Type 2 diabetes mellitus with other diabetic neurological complication: Secondary | ICD-10-CM

## 2020-03-15 NOTE — Chronic Care Management (AMB) (Signed)
  Chronic Care Management   Note  03/15/2020 Name: Jodi Reeves MRN: [id number] DOB: 05/30/1927  [name] is a 86 y.o. year old female who is a primary care patient of [name], Nino Glow, MD. I reached out to Sherron Monday by phone today in response to a referral sent by Ms. Brantley Persons Brasil's PCP, Orland Mustard, MD     Ms. Turlington was given information about Chronic Care Management services today including:  1. CCM service includes personalized support from designated clinical staff supervised by her physician, including individualized plan of care and coordination with other care providers 2. 24/7 contact phone numbers for assistance for urgent and routine care needs. 3. Service will only be billed when office clinical staff spend 20 minutes or more in a month to coordinate care. 4. Only one practitioner may furnish and bill the service in a calendar month. 5. The patient may stop CCM services at any time (effective at the end of the month) by phone call to the office staff. 6. The patient will be responsible for cost sharing (co-pay) of up to 20% of the service fee (after annual deductible is met).  Patient agreed to services and verbal consent obtained.   Follow up plan: Telephone appointment with care management team member scheduled for:03/29/2020  Noreene Larsson, [address], [place], [place] [id number] Direct Dial: [phone] [email or web address] Website: [email or web address]

## 2020-03-15 NOTE — Patient Instructions (Signed)
Medication Instructions:  No changes  If you need a refill on your cardiac medications before your next appointment, please call your pharmacy.    Lab work: No new labs needed   If you have labs (blood work) drawn today and your tests are completely normal, you will receive your results only by: . MyChart Message (if you have MyChart) OR . A paper copy in the mail If you have any lab test that is abnormal or we need to change your treatment, we will call you to review the results.   Testing/Procedures: No new testing needed   Follow-Up: At [facility], you and your health needs are our priority.  As part of our continuing mission to provide you with exceptional heart care, we have created designated Provider Care Teams.  These Care Teams include your primary Cardiologist (physician) and Advanced Practice Providers (APPs -  Physician Assistants and Nurse Practitioners) who all work together to provide you with the care you need, when you need it.  . You will need a follow up appointment in 6 months, APP ok  . Providers on your designated Care Team:   . Christopher Berge, NP . Ryan Dunn, PA-C . Jacquelyn Visser, PA-C  Any Other Special Instructions Will Be Listed Below (If Applicable).  COVID-19 Vaccine Information can be found at: [email or web address] For questions related to vaccine distribution or appointments, please email [email or web address] or call [phone].     

## 2020-03-15 NOTE — Telephone Encounter (Signed)
Pt had appt w/Dr Candis Musa today.  A volunteer brought her up to our office because Dr Candis Musa noticed her lab results from [place].  They haven't contacted pt with results yet.  I suggested pt give them a call and ask about lab results, since Dr Candis Musa had noticed something with urinalysis, then to let us know if she needs an appt w/[facility].

## 2020-03-24 ENCOUNTER — Ambulatory Visit: Payer: Medicare HMO | Admitting: Orthotics

## 2020-03-24 ENCOUNTER — Other Ambulatory Visit: Payer: Self-pay

## 2020-03-24 DIAGNOSIS — M2042 Other hammer toe(s) (acquired), left foot: Secondary | ICD-10-CM

## 2020-03-24 DIAGNOSIS — L97512 Non-pressure chronic ulcer of other part of right foot with fat layer exposed: Secondary | ICD-10-CM

## 2020-03-24 NOTE — Progress Notes (Signed)
Order 9.5 M.   [place] shoe.

## 2020-03-29 ENCOUNTER — Ambulatory Visit: Payer: Medicare HMO | Admitting: Pharmacist

## 2020-03-29 DIAGNOSIS — I890 Lymphedema, not elsewhere classified: Secondary | ICD-10-CM

## 2020-03-29 DIAGNOSIS — I152 Hypertension secondary to endocrine disorders: Secondary | ICD-10-CM

## 2020-03-29 DIAGNOSIS — E1149 Type 2 diabetes mellitus with other diabetic neurological complication: Secondary | ICD-10-CM

## 2020-03-29 DIAGNOSIS — E669 Obesity, unspecified: Secondary | ICD-10-CM

## 2020-03-29 NOTE — Chronic Care Management (AMB) (Signed)
Chronic Care Management   Pharmacy Note  03/29/2020 Name: Jodi Reeves MRN: [id number] DOB: 13-Dec-1927   Subjective:  Jodi Reeves is a 86 y.o. year old female who is a primary care patient of [name], Nino Glow, MD. The CCM team was consulted for assistance with chronic disease management and care coordination needs.    Engaged with patient by telephone for follow up visit in response to provider referral for pharmacy case management and/or care coordination services.   Consent to Services:  [name] was given the following information about Chronic Care Management services today, agreed to services, and gave verbal consent: 1.CCM service includes personalized support from designated clinical staff supervised by her physician, including individualized plan of care and coordination with other care providers 2. 24/7 contact phone numbers for assistance for urgent and routine care needs. 3. Service will only be billed when office clinical staff spend 20 minutes or more in a month to coordinate care. 4. Only one practitioner may furnish and bill the service in a calendar month. 5.The patient may stop CCM services at any time (effective at the end of the month) by phone call to the office staff. 6. The patient will be responsible for cost sharing (co-pay) of up to 20% of the service fee (after annual deductible is met).  SDOH (Social Determinants of Health) assessments and interventions performed:  SDOH Interventions     Most Recent Value  SDOH Interventions  Financial Strain Interventions Other (Comment)  [manufacturer assistance]  Transportation Interventions Other (Comment)  [care guide referral]       Objective:  Lab Results  Component Value Date   CREATININE 0.65 03/10/2020   CREATININE 0.68 09/26/2019   CREATININE 0.79 11/18/2018    Lab Results  Component Value Date   HGBA1C 6.9 (H) 09/26/2019       Component Value Date/Time   CHOL 171 03/10/2020 1353    CHOL 185 09/26/2019 1011   TRIG 79.0 03/10/2020 1353   HDL 67.50 03/10/2020 1353   HDL 67 09/26/2019 1011   CHOLHDL 3 03/10/2020 1353   VLDL 15.8 03/10/2020 1353   LDLCALC 87 03/10/2020 1353   LDLCALC 107 (H) 09/26/2019 1011   LDLDIRECT 130.3 11/27/2008 1250    Lab Results  Component Value Date   TSH 1.28 03/10/2020    BP Readings from Last 3 Encounters:  03/15/20 138/78  03/10/20 116/80  01/22/20 (!) 141/81    Assessment/Interventions: Review of patient past medical history, allergies, medications, health status, including review of consultants reports, laboratory and other test data, was performed as part of comprehensive evaluation and provision of chronic care management services.   Allergies  Allergen Reactions  . Amiodarone Other (See Comments)    Severe Thyroid issues   . Penicillins Anaphylaxis and Other (See Comments)    Has patient had a PCN reaction causing immediate rash, facial/tongue/throat swelling, SOB or lightheadedness with hypotension: Yes Has patient had a PCN reaction causing severe rash involving mucus membranes or skin necrosis: No Has patient had a PCN reaction that required hospitalization: No Has patient had a PCN reaction occurring within the last 10 years: No If all of the above answers are "NO", then may proceed with Cephalosporin use.   . Actos [Pioglitazone]     Not effective   . Amaryl [Glimepiride]     Not effective    . Atorvastatin Other (See Comments)    Muscle aches & All statins per Patient  . Bentyl [Dicyclomine Hcl]  Could not tolerate nausea, reduced concentration, h/a   . Ciprofloxacin Other (See Comments)    High blood pressure    . Codeine Nausea And Vomiting  . Ezetimibe-Simvastatin Other (See Comments)    Muscle aches, nausea, back pain   . Fosamax [Alendronate]     dysphagia  . Lovastatin Other (See Comments)    Myalgias  . Macrobid [Nitrofurantoin Macrocrystal]     Severe Itching, rash   . Protonix  [Pantoprazole Sodium]     Esophageal problems    . Rosiglitazone Maleate Other (See Comments)    Edema  . Statins     Muscle and joint aches to all statins  . Victoza [Liraglutide]     Nausea   . Alendronate Sodium Rash    Dysphagia and ulceration   . Bactrim [Sulfamethoxazole-Trimethoprim] Rash    itching  . Sulfa Antibiotics Rash    Itching     Medications Reviewed Today    Reviewed by De Hollingshead, RPH-CPP (Pharmacist) on 03/29/20 at [place] List Status: <None>  Medication Order Taking? Sig Documenting Provider Last Dose Status Informant  acetaminophen (TYLENOL) 500 MG tablet [id number]  Take 1,000 mg by mouth every 6 (six) hours as needed for moderate pain or headache. [provider]  Active Self  Ascorbic Acid (VITAMIN C) POWD [id number] Yes Take 200 mg by mouth daily.  [provider] Taking Active Self  bisoprolol (ZEBETA) 5 MG tablet [id number] Yes Take 1 tablet (5 mg total) by mouth daily. Minna Merritts, MD Taking Active   co-enzyme Q-10 30 MG capsule [id number] Yes Take 30 mg by mouth daily.  [provider] Taking Active   estradiol (ESTRACE) 0.1 MG/GM vaginal cream [id number] Yes APPLY 0.5MG (PEA SIZED AMOUNT) JUST INSIDE THE VAGINA WITH FINGER-TIP ON MONDAY, WEDNESDAY AND FRIDAY NIGHTS. [name], PA-C Taking Active   furosemide (LASIX) 20 MG tablet [id number] Yes Take 1 tablet (20 mg total) by mouth 2 (two) times daily. Minna Merritts, MD Taking Active   glucose blood (ONE TOUCH ULTRA TEST) test strip [id number] Yes Use to test blood sugar once daily E11.49  Patient taking differently: Use to test blood sugar twice daily E11.49   Bedsole, Amy E, MD Taking Active Self  levothyroxine (SYNTHROID, LEVOTHROID) 50 MCG tablet [id number] Yes Take 50 mcg by mouth daily before breakfast. [provider] Taking Active Self  losartan (COZAAR) 25 MG tablet [id number] Yes Take 0.5 tablets (12.5 mg total) by mouth daily.  Patient  taking differently: Take 12.5 mg by mouth daily. Take if BP >110 sbp   Gollan, Kathlene November, MD Taking Active   magnesium oxide (MAG-OX) 400 MG tablet [id number] Yes Take 400 mg by mouth daily. [provider] Taking Active   metFORMIN (GLUCOPHAGE-XR) 500 MG 24 hr tablet [id number] Yes Take 500 mg by mouth 2 (two) times daily.  [provider] Taking Active   Multiple Vitamin (CALCIUM COMPLEX PO) [id number] Yes Take 1 tablet by mouth daily. [provider] Taking Active Self  Multiple Vitamins-Minerals (MULTIVITAMIN GUMMIES ADULT PO) [id number] Yes Take 1 each by mouth daily. [provider] Taking Active   NON FORMULARY [id number] Yes [place]ita Supplement B12 Takes qd. B6 qd, and folic acid [provider] Taking Active   Olopatadine HCl 0.2 % SOLN [id number] Yes 1 drop once daily [provider] Taking Active   Omega-3 1000 MG CAPS [id number] Yes Take 2,000 mg by mouth daily.  [provider] Taking Active Self  potassium chloride (KLOR-CON) 10 MEQ tablet [id number] Yes Take 1 tablet (10 mEq total) by mouth 2 (two) times daily. Minna Merritts, MD Taking Active   prednisoLONE acetate (PRED FORTE) 1 % ophthalmic suspension [id number] Yes  [provider] Taking Active   Probiotic Product (ALIGN PO) [id number] Yes Take 1 capsule by mouth daily. [provider] Taking Active   psyllium (METAMUCIL) 58.6 % packet [id number] Yes Take 1 packet by mouth daily.  [provider] Taking Active   SANTYL ointment [id number] Yes Apply 1 application topically daily. Felipa Furnace, DPM Taking Active   XARELTO 20 MG TABS tablet [id number] Yes Take 1 tablet (20 mg total) by mouth daily with supper. Minna Merritts, MD Taking Active           Patient Active Problem List   Diagnosis Date Noted  . Hypertension associated with diabetes ([place]) 03/15/2020  . Obesity (BMI 30-39.9) 03/15/2020  . Coronary artery disease involving native  coronary artery of native heart without angina pectoris 03/15/2020  . PAD (peripheral artery disease) ([place]) 08/14/2019  . Foot ulcer ([place]) 06/19/2019  . Lymphedema 06/09/2019  . Chronic venous insufficiency 06/09/2019  . Diabetic retinopathy associated with type 2 diabetes mellitus ([place]) 01/20/2019  . Low back pain 09/24/2018  . Overactive bladder 04/10/2018  . History of UTI 04/10/2018  . Hematuria 04/10/2018  . Diarrhea 04/10/2018  . Venous ulcer of leg ([place]) 04/10/2018  . Status post endoscopic carpal tunnel release 03/19/2018  . History of skin cancer 11/08/2017  . Chronic anticoagulation 06/11/2017  . Aortic atherosclerosis ([place]) 05/08/2017  . Coronary artery disease, non-occlusive 03/29/2017  . Persistent atrial fibrillation ([place]) 03/29/2017  . Carpal tunnel syndrome 03/28/2017  . Cervical radiculitis 03/28/2017  . Problems with swallowing and mastication   . Esophageal candidiasis ([place])   . Stricture and stenosis of esophagus 05/02/2016  . TIA (transient ischemic attack) 06/24/2015  . 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 Humeral head fracture 10/06/2014  . Chest pain 03/27/2014  . Shortness of breath 03/27/2014  . Chronic diastolic CHF (congestive heart failure) ([place]) 03/27/2014  . Advanced directives, counseling/discussion 02/24/2014  . Routine general medical examination at a health care facility 02/17/2013  . Diabetes, polyneuropathy ([place]) 02/17/2013  . Type 2 diabetes mellitus with diabetic polyneuropathy, without long-term current use of insulin ([place]) 02/17/2013  . Gait instability 04/11/2012  . Edema 11/14/2010  . OTHER CONSTIPATION 05/31/2007  . VENTRAL HERNIA 03/15/2007  . Ovarian cancer ([place]) 11/20/2006  . Hypothyroidism 11/20/2006  . Type 2 diabetes mellitus with neurological manifestations, controlled ([place]) 11/20/2006  . Essential hypertension 11/20/2006  . Atrial fibrillation ([place]) 11/20/2006  . Osteoporosis 11/20/2006  .  Hyperlipidemia due to type 2 diabetes mellitus ([place]) 11/20/2006    Medication Assistance: may puruse patient assistance for GLP1, will coordinate w/ endocrinology  Patient Care Plan: Medication Management    Problem Identified: Diabetes, HTN     Long-Range Goal: Disease Progression Prevention   This Visit's Progress: On track  Priority: High  Note:   Current Barriers:  . Unable to independently afford treatment regimen . Unable to achieve control of diabetes   Pharmacist Clinical Goal(s):  Marland Kitchen Over the next 90 days, patient will verbalize ability to afford treatment regimen. . Over the next 90 days, patient will maintain control of diabetes as evidenced by maintenance of goal A1c through collaboration with PharmD and provider.   Interventions: . Inter-disciplinary care team collaboration (see longitudinal plan of care) .  Comprehensive medication review performed; medication list updated in electronic medical record  Diabetes: . Uncontrolled, though a more relaxed A1c goal likely appropriate; current treatment: metformin XR 500 mg BID (metformin dose limited by GI upset), follows w/ Dr. Honor Junes o Hx Lady Gary - reported bloating, GI upset (that did not completed resolve after discontinuation) o Hx pioglitazone/rosiglitazone - edema o Hx glimperide - lack of benefit o Discussion of GLP1, but was too expensive o Jardiance - declined d/t baseline urinary urgency   . Current glucose readings: fasting glucose: 140-160, post prandial glucose: 140-220. Difficult to get blood for finger sticks. Significant benefit in reducing risk of hypoglycemia. CGM would be clinically beneficial for this patient.  Marland Kitchen Hx intolerance to statins and ezetimibe, though LDL appropriately controlled at this time given age . Discussed income. Patient would qualify for patient assistance from [employer] or [employer] (Rybelsus, [place], or [employer]). Will outreach Dr. Honor Junes to determine his preference for  treatment moving forward. Of note, [employer] products would ship directly to patient's home, thereby reducing transportation concerns. Would need to follow closely to determine if GLP1 negatively impacted patient's baseline GI complaints.  . Patient interested in CGM. Will place order to determine if covered.   Hypertension with Atrial Fibrillation with lymphedema . Controlled; current treatment: bisoprolol 5 mg daily, losartan 12.5 mg PRN SBP >110; furosemide 20 mg BID + potassium 10 mEq BID;  . Anticoagulant regimen: Xarelto 20 mg daily; follows w/ Dr. Rockey Situ . Current home readings: SBP 105-140s, took losartan every day last week, but didn't need this morning . Recommended to continue current regimen and collaboration with cardiology . Placed Care Guide referral to aid in transportation    Hypothyroidism . Controlled; current treatment: Synthroid 50 mcg daily . Recommended to continue current regimen at this time  Gastrointestinal Complaints - Diarrhea and Constipation . Uncontrolled; fluctuates between diarrhea and constipation, and notes this makes it more difficult to plan leaving the house  Urinary urgency/hx UTIs: . Appropriately controlled; current treatment: estradiol vaginal cream 3 days per week. Denies cost concerns at this time.  . Continue current regimen at this time.   Patient Goals/Self-Care Activities . Over the next 90 days, patient will:  - take medications as prescribed check blood glucose daily, document, and provide at future appointments check blood pressure daily, document, and provide at future appointments collaborate with provider on medication access solutions  Follow Up Plan: Telephone follow up appointment with care management team member scheduled for: ~ 4 weeks       Plan: Telephone follow up appointment with care management team member scheduled for: ~ 4 weeks  Catie Darnelle Maffucci, PharmD, [place], [place] Pharmacist [place] [phone]

## 2020-03-29 NOTE — Patient Instructions (Signed)
Jodi Reeves,   It was great talking with you today!  - I've placed a referral for one of our Care Guides to call you to discuss any transportation resources that would be available to you.  - I'll reach out to Jodi Reeves regarding pursuing patient assistance for either Trulicity, Rybelsus, or Ozempic.  - Be on the lookout for a call from Jodi Reeves to determine if the Jodi Reeves would be covered by your insurance. If it isn't, the cash price at a local pharmacy would be $75/month.    Call me with any questions or concerns!  Jodi Reeves, PharmD, [place], [place]    Visit Information Patient Care Plan: Medication Management    Problem Identified: Diabetes, HTN     Long-Range Goal: Disease Progression Prevention   This Visit's Progress: On track  Priority: High  Note:   Current Barriers:  . Unable to independently afford treatment regimen . Unable to achieve control of diabetes   Pharmacist Clinical Goal(s):  Marland Kitchen Over the next 90 days, patient will verbalize ability to afford treatment regimen. . Over the next 90 days, patient will maintain control of diabetes as evidenced by maintenance of goal A1c through collaboration with PharmD and provider.   Interventions: . Inter-disciplinary care team collaboration (see longitudinal plan of care) . Comprehensive medication review performed; medication list updated in electronic medical record  Diabetes: . Uncontrolled, though a more relaxed A1c goal likely appropriate; current treatment: metformin XR 500 mg BID (metformin dose limited by GI upset), follows w/ Jodi Reeves o Hx Jodi Reeves - reported bloating, GI upset (that did not completed resolve after discontinuation) o Hx pioglitazone/rosiglitazone - edema o Hx glimperide - lack of benefit o Discussion of GLP1, but was too expensive o Jardiance - declined d/t baseline urinary urgency   . Current glucose readings: fasting glucose: 140-160, post prandial glucose:  140-220. Difficult to get blood for finger sticks. Significant benefit in reducing risk of hypoglycemia. CGM would be clinically beneficial for this patient.  Marland Kitchen Hx intolerance to statins and ezetimibe, though LDL appropriately controlled at this time given age . Discussed income. Patient would qualify for patient assistance from [employer] or [employer] ([employer], [place], or [employer]). Will outreach Jodi Reeves to determine his preference for treatment moving forward. Of note, [employer] products would ship directly to patient's home, thereby reducing transportation concerns. Would need to follow closely to determine if GLP1 negatively impacted patient's baseline GI complaints.  . Patient interested in CGM. Will place order to determine if covered.   Hypertension with Atrial Fibrillation with lymphedema . Controlled; current treatment: bisoprolol 5 mg daily, losartan 12.5 mg PRN SBP >110; furosemide 20 mg BID + potassium 10 mEq BID;  . Anticoagulant regimen: Xarelto 20 mg daily; follows w/ Dr. Rockey Reeves . Current home readings: SBP 105-140s, took losartan every day last week, but didn't need this morning . Recommended to continue current regimen and collaboration with cardiology . Placed Care Guide referral to aid in transportation    Hypothyroidism . Controlled; current treatment: Synthroid 50 mcg daily . Recommended to continue current regimen at this time  Gastrointestinal Complaints - Diarrhea and Constipation . Uncontrolled; fluctuates between diarrhea and constipation, and notes this makes it more difficult to plan leaving the house  Urinary urgency/hx UTIs: . Appropriately controlled; current treatment: estradiol vaginal cream 3 days per week. Denies cost concerns at this time.  . Continue current regimen at this time.   Patient Goals/Self-Care Activities . Over the next 90  days, patient will:  - take medications as prescribed check blood glucose daily, document, and provide at future  appointments check blood pressure daily, document, and provide at future appointments collaborate with provider on medication access solutions  Follow Up Plan: Telephone follow up appointment with care management team member scheduled for: ~ 4 weeks      Ms. Leuthold was given information about Chronic Care Management services today including:  1. CCM service includes personalized support from designated clinical staff supervised by her physician, including individualized plan of care and coordination with other care providers Reeves. 24/7 contact phone numbers for assistance for urgent and routine care needs. 3. Service will only be billed when office clinical staff spend 20 minutes or more in a month to coordinate care. 4. Only one practitioner may furnish and bill the service in a calendar month. 5. The patient may stop CCM services at any time (effective at the end of the month) by phone call to the office staff. 6. The patient will be responsible for cost sharing (co-pay) of up to 20% of the service fee (after annual deductible is met).  Patient agreed to services and verbal consent obtained.   The patient verbalized understanding of instructions, educational materials, and care plan provided today and agreed to receive a mailed copy of patient instructions, educational materials, and care plan.   Plan: Telephone follow up appointment with care management team member scheduled for:  Jodi Reeves, PharmD, [place], [place] Pharmacist [place] [phone]

## 2020-03-30 ENCOUNTER — Telehealth: Payer: Self-pay

## 2020-03-30 NOTE — Telephone Encounter (Signed)
    MA12/10/2019 1st Attempt  Name: Jodi Reeves   MRN: [id number]   DOB: Sep 23, 1927   AGE: 86 y.o.   GENDER: female   PCP [name], Nino Glow, MD.   03/30/2020 Spoke with patient to set-up transportation for her.  She is not comfortable riding [place] or [facility] [employer] due to her bladder and bowel issues. I asked the patient if wearing depends would allow her to travel and she said no.  These are the only travel options available in [facility] and the patient does not want to ride either. She is in the [place] system in case she changes her mind.  Closing referral.    Embry Huss, AAS Paralegal, [place] . Embedded Care Coordination [facility]  Care Management  [address], [place] [address] [email or web address]  [phone]   [email or web address]

## 2020-04-05 ENCOUNTER — Ambulatory Visit: Payer: Medicare HMO | Admitting: Pharmacist

## 2020-04-05 ENCOUNTER — Telehealth: Payer: Self-pay | Admitting: Pharmacist

## 2020-04-05 DIAGNOSIS — E1149 Type 2 diabetes mellitus with other diabetic neurological complication: Secondary | ICD-10-CM

## 2020-04-05 DIAGNOSIS — I152 Hypertension secondary to endocrine disorders: Secondary | ICD-10-CM

## 2020-04-05 DIAGNOSIS — E1159 Type 2 diabetes mellitus with other circulatory complications: Secondary | ICD-10-CM

## 2020-04-05 NOTE — Patient Instructions (Signed)
Visit Information  Patient Care Plan: Medication Management    Problem Identified: Diabetes, HTN     Long-Range Goal: Disease Progression Prevention   This Visit's Progress: On track  Recent Progress: On track  Priority: High  Note:   Current Barriers:  . Unable to independently afford treatment regimen . Unable to achieve control of diabetes   Pharmacist Clinical Goal(s):  Marland Kitchen Over the next 90 days, patient will verbalize ability to afford treatment regimen. . Over the next 90 days, patient will maintain control of diabetes as evidenced by maintenance of goal A1c through collaboration with PharmD and provider.   Interventions: . Inter-disciplinary care team collaboration (see longitudinal plan of care) . Comprehensive medication review performed; medication list updated in electronic medical record  Diabetes: . Uncontrolled, though a more relaxed A1c goal likely appropriate; current treatment: metformin XR 500 mg BID (metformin dose limited by GI upset), follows w/ Dr. Honor Junes o Hx Lady Gary - reported bloating, GI upset (that did not completed resolve after discontinuation) o Hx pioglitazone/rosiglitazone - edema o Hx glimperide - lack of benefit o Discussion of GLP1, but was too expensive o Jardiance - declined d/t baseline urinary urgency   . Hx intolerance to statins and ezetimibe, though LDL appropriately controlled at this time given age . Contacted Dr. Sherren Mocha office to follow up on visit note I faxed over last week regarding addition of GLP1 via patient assistance. LVM with Dr. Sherren Mocha nurse.   Hypertension with Atrial Fibrillation with lymphedema . Controlled; current treatment: bisoprolol 5 mg daily, losartan 12.5 mg PRN SBP >110; furosemide 20 mg BID + potassium 10 mEq BID;  . Anticoagulant regimen: Xarelto 20 mg daily; follows w/ Dr. Rockey Situ . Current home readings: SBP 105-140s, took losartan every day last week, but didn't need this morning . Recommended to  continue current regimen and collaboration with cardiology  Hypothyroidism . Controlled; current treatment: Synthroid 50 mcg daily . Recommended to continue current regimen at this time  Gastrointestinal Complaints - Diarrhea and Constipation . Uncontrolled; fluctuates between diarrhea and constipation, and notes this makes it more difficult to plan leaving the house  Urinary urgency/hx UTIs: . Appropriately controlled; current treatment: estradiol vaginal cream 3 days per week. Denies cost concerns at this time.  . Continue current regimen at this time.   Patient Goals/Self-Care Activities . Over the next 90 days, patient will:  - take medications as prescribed check blood glucose daily, document, and provide at future appointments check blood pressure daily, document, and provide at future appointments collaborate with provider on medication access solutions  Follow Up Plan: Telephone follow up appointment with care management team member scheduled for: ~ 2 weeks      The patient verbalized understanding of instructions, educational materials, and care plan provided today and declined offer to receive copy of patient instructions, educational materials, and care plan.    Plan: Telephone follow up appointment with care management team member scheduled for:~ 2 weeks as previously scheduled  Catie Darnelle Maffucci, PharmD, [place], [place] Pharmacist  [place] [phone]

## 2020-04-05 NOTE — Chronic Care Management (AMB) (Signed)
Chronic Care Management   Pharmacy Note  04/05/2020 Name: Jodi Reeves MRN: [id number] DOB: 04/30/1927   Subjective:  Jodi Reeves is a 86 y.o. year old female who is a primary care patient of [name], Nino Glow, MD. The CCM team was consulted for assistance with chronic disease management and care coordination needs.    Care coordination completed today in response to provider referral for pharmacy case management and/or care coordination services.   Consent to Services:  Ms. Bangerter was given information about Chronic Care Management services, agreed to services, and gave verbal consent prior to initiation of services on 03/29/20. Please see initial visit note for detailed documentation.   SDOH (Social Determinants of Health) assessments and interventions performed:  SDOH Interventions   Flowsheet Row Most Recent Value  SDOH Interventions   Financial Strain Interventions Other (Comment)  [manufacturer assistance]       Objective:  Lab Results  Component Value Date   CREATININE 0.65 03/10/2020   CREATININE 0.68 09/26/2019   CREATININE 0.79 11/18/2018    Lab Results  Component Value Date   HGBA1C 6.9 (H) 09/26/2019       Component Value Date/Time   CHOL 171 03/10/2020 1353   CHOL 185 09/26/2019 1011   TRIG 79.0 03/10/2020 1353   HDL 67.50 03/10/2020 1353   HDL 67 09/26/2019 1011   CHOLHDL 3 03/10/2020 1353   VLDL 15.8 03/10/2020 1353   LDLCALC 87 03/10/2020 1353   LDLCALC 107 (H) 09/26/2019 1011   LDLDIRECT 130.3 11/27/2008 1250      BP Readings from Last 3 Encounters:  03/15/20 138/78  03/10/20 116/80  01/22/20 (!) 141/81    Assessment/Interventions: Review of patient past medical history, allergies, medications, health status, including review of consultants reports, laboratory and other test data, was performed as part of comprehensive evaluation and provision of chronic care management services.   Allergies  Allergen Reactions    Amiodarone Other (See Comments)    Severe Thyroid issues    Penicillins Anaphylaxis and Other (See Comments)    Has patient had a PCN reaction causing immediate rash, facial/tongue/throat swelling, SOB or lightheadedness with hypotension: Yes Has patient had a PCN reaction causing severe rash involving mucus membranes or skin necrosis: No Has patient had a PCN reaction that required hospitalization: No Has patient had a PCN reaction occurring within the last 10 years: No If all of the above answers are "NO", then may proceed with Cephalosporin use.    Actos [Pioglitazone]     Not effective    Amaryl [Glimepiride]     Not effective     Atorvastatin Other (See Comments)    Muscle aches & All statins per Patient   Bentyl [Dicyclomine Hcl]     Could not tolerate nausea, reduced concentration, h/a    Ciprofloxacin Other (See Comments)    High blood pressure     Codeine Nausea And Vomiting   Ezetimibe-Simvastatin Other (See Comments)    Muscle aches, nausea, back pain    Fosamax [Alendronate]     dysphagia   Lovastatin Other (See Comments)    Myalgias   Macrobid [Nitrofurantoin Macrocrystal]     Severe Itching, rash    Protonix [Pantoprazole Sodium]     Esophageal problems     Rosiglitazone Maleate Other (See Comments)    Edema   Statins     Muscle and joint aches to all statins   Victoza [Liraglutide]     Nausea    Alendronate Sodium Rash  Dysphagia and ulceration    Bactrim [Sulfamethoxazole-Trimethoprim] Rash    itching   Sulfa Antibiotics Rash    Itching     Medications Reviewed Today    Reviewed by De Hollingshead, RPH-CPP (Pharmacist) on 03/29/20 at [place] List Status: <None>  Medication Order Taking? Sig Documenting Provider Last Dose Status Informant  acetaminophen (TYLENOL) 500 MG tablet [id number]  Take 1,000 mg by mouth every 6 (six) hours as needed for moderate pain or headache. [provider]  Active Self  Ascorbic Acid  (VITAMIN C) POWD [id number] Yes Take 200 mg by mouth daily.  [provider] Taking Active Self  bisoprolol (ZEBETA) 5 MG tablet [id number] Yes Take 1 tablet (5 mg total) by mouth daily. Minna Merritts, MD Taking Active   co-enzyme Q-10 30 MG capsule [id number] Yes Take 30 mg by mouth daily.  [provider] Taking Active   estradiol (ESTRACE) 0.1 MG/GM vaginal cream [id number] Yes APPLY 0.5MG  (PEA SIZED AMOUNT) JUST INSIDE THE VAGINA WITH FINGER-TIP ON MONDAY, WEDNESDAY AND FRIDAY NIGHTS. [name], PA-C Taking Active   furosemide (LASIX) 20 MG tablet [id number] Yes Take 1 tablet (20 mg total) by mouth 2 (two) times daily. Minna Merritts, MD Taking Active   glucose blood (ONE TOUCH ULTRA TEST) test strip [id number] Yes Use to test blood sugar once daily E11.49  Patient taking differently: Use to test blood sugar twice daily E11.49   Bedsole, Amy E, MD Taking Active Self  levothyroxine (SYNTHROID, LEVOTHROID) 50 MCG tablet [id number] Yes Take 50 mcg by mouth daily before breakfast. [provider] Taking Active Self  losartan (COZAAR) 25 MG tablet [id number] Yes Take 0.5 tablets (12.5 mg total) by mouth daily.  Patient taking differently: Take 12.5 mg by mouth daily. Take if BP >110 sbp   Gollan, Kathlene November, MD Taking Active   magnesium oxide (MAG-OX) 400 MG tablet [id number] Yes Take 400 mg by mouth daily. [provider] Taking Active   metFORMIN (GLUCOPHAGE-XR) 500 MG 24 hr tablet [id number] Yes Take 500 mg by mouth 2 (two) times daily.  [provider] Taking Active   Multiple Vitamin (CALCIUM COMPLEX PO) [id number] Yes Take 1 tablet by mouth daily. [provider] Taking Active Self  Multiple Vitamins-Minerals (MULTIVITAMIN GUMMIES ADULT PO) [id number] Yes Take 1 each by mouth daily. [provider] Taking Active   NON FORMULARY [id number] Yes Trivita Supplement B12 Takes qd. B6 qd, and folic acid [provider] Taking  Active   Olopatadine HCl 0.2 % SOLN [id number] Yes 1 drop once daily [provider] Taking Active   Omega-3 1000 MG CAPS [id number] Yes Take 2,000 mg by mouth daily.  [provider] Taking Active Self  potassium chloride (KLOR-CON) 10 MEQ tablet [id number] Yes Take 1 tablet (10 mEq total) by mouth 2 (two) times daily. Minna Merritts, MD Taking Active   prednisoLONE acetate (PRED FORTE) 1 % ophthalmic suspension [id number] Yes  [provider] Taking Active   Probiotic Product (ALIGN PO) [id number] Yes Take 1 capsule by mouth daily. [provider] Taking Active   psyllium (METAMUCIL) 58.6 % packet [id number] Yes Take 1 packet by mouth daily.  [provider] Taking Active   SANTYL ointment [id number] Yes Apply 1 application topically daily. Felipa Furnace, DPM Taking Active   XARELTO 20 MG TABS tablet [id number] Yes Take 1 tablet (20 mg total) by mouth daily with supper. Minna Merritts, MD Taking Active  Patient Active Problem List   Diagnosis Date Noted   Hypertension associated with diabetes ([place]) 03/15/2020   Obesity (BMI 30-39.9) 03/15/2020   Coronary artery disease involving native coronary artery of native heart without angina pectoris 03/15/2020   PAD (peripheral artery disease) ([place]) 08/14/2019   Foot ulcer ([place]) 06/19/2019   Lymphedema 06/09/2019   Chronic venous insufficiency 06/09/2019   Diabetic retinopathy associated with type 2 diabetes mellitus ([place]) 01/20/2019   Low back pain 09/24/2018   Overactive bladder 04/10/2018   History of UTI 04/10/2018   Hematuria 04/10/2018   Diarrhea 04/10/2018   Venous ulcer of leg ([place]) 04/10/2018   Status post endoscopic carpal tunnel release 03/19/2018   History of skin cancer 11/08/2017   Chronic anticoagulation 06/11/2017   Aortic atherosclerosis ([place]) 05/08/2017   Coronary artery disease, non-occlusive 03/29/2017   Persistent atrial fibrillation ([place])  03/29/2017   Carpal tunnel syndrome 03/28/2017   Cervical radiculitis 03/28/2017   Problems with swallowing and mastication    Esophageal candidiasis ([place])    Stricture and stenosis of esophagus 05/02/2016   TIA (transient ischemic attack) 06/24/2015   Status post reverse total shoulder replacement, left 10/16/2014   Humeral head fracture 10/06/2014   Chest pain 03/27/2014   Shortness of breath 03/27/2014   Chronic diastolic CHF (congestive heart failure) ([place]) 03/27/2014   Advanced directives, counseling/discussion 02/24/2014   Routine general medical examination at a health care facility 02/17/2013   Diabetes, polyneuropathy ([place]) 02/17/2013   Type 2 diabetes mellitus with diabetic polyneuropathy, without long-term current use of insulin ([place]) 02/17/2013   Gait instability 04/11/2012   Edema 11/14/2010   OTHER CONSTIPATION 05/31/2007   VENTRAL HERNIA 03/15/2007   Ovarian cancer ([place]) 11/20/2006   Hypothyroidism 11/20/2006   Type 2 diabetes mellitus with neurological manifestations, controlled ([place]) 11/20/2006   Essential hypertension 11/20/2006   Atrial fibrillation ([place]) 11/20/2006   Osteoporosis 11/20/2006   Hyperlipidemia due to type 2 diabetes mellitus ([place]) 11/20/2006    Medication Assistance: currently discussing need for patient assistance w/ Dr. Honor Junes  Patient Care Plan: Medication Management    Problem Identified: Diabetes, HTN     Long-Range Goal: Disease Progression Prevention   This Visit's Progress: On track  Recent Progress: On track  Priority: High  Note:   Current Barriers:   Unable to independently afford treatment regimen  Unable to achieve control of diabetes   Pharmacist Clinical Goal(s):   Over the next 90 days, patient will verbalize ability to afford treatment regimen.  Over the next 90 days, patient will maintain control of diabetes as evidenced by maintenance of goal A1c through collaboration with PharmD and  provider.   Interventions:  Inter-disciplinary care team collaboration (see longitudinal plan of care)  Comprehensive medication review performed; medication list updated in electronic medical record  Diabetes:  Uncontrolled, though a more relaxed A1c goal likely appropriate; current treatment: metformin XR 500 mg BID (metformin dose limited by GI upset), follows w/ Dr. Honor Junes o Hx Lady Gary - reported bloating, GI upset (that did not completed resolve after discontinuation) o Hx pioglitazone/rosiglitazone - edema o Hx glimperide - lack of benefit o Discussion of GLP1, but was too expensive o Jardiance - declined d/t baseline urinary urgency    Hx intolerance to statins and ezetimibe, though LDL appropriately controlled at this time given age  Contacted Dr. Sherren Mocha office to follow up on visit note I faxed over last week regarding addition of GLP1 via patient assistance. LVM with Dr. Sherren Mocha nurse.   Hypertension with  Atrial Fibrillation with lymphedema  Controlled; current treatment: bisoprolol 5 mg daily, losartan 12.5 mg PRN SBP >110; furosemide 20 mg BID + potassium 10 mEq BID;   Anticoagulant regimen: Xarelto 20 mg daily; follows w/ Dr. Rockey Situ  Current home readings: SBP 105-140s, took losartan every day last week, but didn't need this morning  Recommended to continue current regimen and collaboration with cardiology  Hypothyroidism  Controlled; current treatment: Synthroid 50 mcg daily  Recommended to continue current regimen at this time  Gastrointestinal Complaints - Diarrhea and Constipation  Uncontrolled; fluctuates between diarrhea and constipation, and notes this makes it more difficult to plan leaving the house  Urinary urgency/hx UTIs:  Appropriately controlled; current treatment: estradiol vaginal cream 3 days per week. Denies cost concerns at this time.   Continue current regimen at this time.   Patient Goals/Self-Care Activities  Over the  next 90 days, patient will:  - take medications as prescribed check blood glucose daily, document, and provide at future appointments check blood pressure daily, document, and provide at future appointments collaborate with provider on medication access solutions  Follow Up Plan: Telephone follow up appointment with care management team member scheduled for: ~ 2 weeks       Plan: Telephone follow up appointment with care management team member scheduled for:~ 2 weeks as previously scheduled  Catie Darnelle Maffucci, PharmD, [place], [place] Pharmacist [facility] [place] [phone]

## 2020-04-05 NOTE — Telephone Encounter (Signed)
Patient calls to report being sick x 1 week. Started with sore throat, felt like a "cold". Nasal congestion and coughing. Took home COVID test, negative x 2. Went to [facility]. Negative rapid COVID test there. She notes that chest xray did not show any problems.   Today, reports no strength, HA, no appetite, body aches, coughs. Does report that her voice is a bit better today. Is wondering if anything can be done before her virtual appointment with Dr. Olivia Mackie on Wednesday.

## 2020-04-06 NOTE — Telephone Encounter (Signed)
Called and spoke with the Patient. She states she went back to urgent care this morning and was treated for upper respiratory symptoms and a UTI.   Notes are in [facility] care everywhere. She was placed on a 5 day antibiotic.  Canceled Patient appointment for 04/07/20. She will call back at the end of her antibiotic treatment if she is not feeling better.

## 2020-04-06 NOTE — Telephone Encounter (Signed)
Call and go over otc meds. If she is so weak rec [place] urgent care today probably best if feeling this weak and lack of appetite  Did they do labs at [place]?    Increase hydration with fluids  Warm tea with honey and lemon Mucinex dm green label for cough. Vitamin C 1000 mg daily. Zinc 100 mg daily. Sugar free cough drops if coughing

## 2020-04-07 ENCOUNTER — Telehealth: Payer: Medicare HMO | Admitting: Internal Medicine

## 2020-04-07 ENCOUNTER — Telehealth: Payer: Self-pay

## 2020-04-07 ENCOUNTER — Ambulatory Visit: Payer: Medicare HMO | Admitting: Pharmacist

## 2020-04-07 DIAGNOSIS — I4891 Unspecified atrial fibrillation: Secondary | ICD-10-CM

## 2020-04-07 DIAGNOSIS — I152 Hypertension secondary to endocrine disorders: Secondary | ICD-10-CM

## 2020-04-07 DIAGNOSIS — N3281 Overactive bladder: Secondary | ICD-10-CM

## 2020-04-07 DIAGNOSIS — I890 Lymphedema, not elsewhere classified: Secondary | ICD-10-CM

## 2020-04-07 DIAGNOSIS — E1149 Type 2 diabetes mellitus with other diabetic neurological complication: Secondary | ICD-10-CM

## 2020-04-07 NOTE — Telephone Encounter (Signed)
Returned call. See CCM documentation 

## 2020-04-07 NOTE — Telephone Encounter (Signed)
Pt would like a call back. She didn't disclose why just said it was regarding her medications.

## 2020-04-07 NOTE — Chronic Care Management (AMB) (Signed)
Chronic Care Management   Pharmacy Note  04/07/2020 Name: Jodi Reeves MRN: [id number] DOB: 12/10/1927  Subjective:  Jodi Reeves is a 86 y.o. year old female who is a primary care patient of [name], Nino Glow, MD. The CCM team was consulted for assistance with chronic disease management and care coordination needs.    Engaged with patient by telephone for a return call in response to provider referral for pharmacy case management and/or care coordination services.   Consent to Services:  [name] was given information about Chronic Care Management services, agreed to services, and gave verbal consent prior to initiation of services on 03/29/20. Please see initial visit note for detailed documentation.   Objective:  Lab Results  Component Value Date   CREATININE 0.65 03/10/2020   CREATININE 0.68 09/26/2019   CREATININE 0.79 11/18/2018    Lab Results  Component Value Date   HGBA1C 6.9 (H) 09/26/2019       Component Value Date/Time   CHOL 171 03/10/2020 1353   CHOL 185 09/26/2019 1011   TRIG 79.0 03/10/2020 1353   HDL 67.50 03/10/2020 1353   HDL 67 09/26/2019 1011   CHOLHDL 3 03/10/2020 1353   VLDL 15.8 03/10/2020 1353   LDLCALC 87 03/10/2020 1353   LDLCALC 107 (H) 09/26/2019 1011   LDLDIRECT 130.3 11/27/2008 1250    BP Readings from Last 3 Encounters:  03/15/20 138/78  03/10/20 116/80  01/22/20 (!) 141/81    Assessment/Interventions: Review of patient past medical history, allergies, medications, health status, including review of consultants reports, laboratory and other test data, was performed as part of comprehensive evaluation and provision of chronic care management services.   SDOH (Social Determinants of Health) assessments and interventions performed:    CCM Care Plan  Allergies  Allergen Reactions  . Amiodarone Other (See Comments)    Severe Thyroid issues   . Penicillins Anaphylaxis and Other (See Comments)    Has patient had a  PCN reaction causing immediate rash, facial/tongue/throat swelling, SOB or lightheadedness with hypotension: Yes Has patient had a PCN reaction causing severe rash involving mucus membranes or skin necrosis: No Has patient had a PCN reaction that required hospitalization: No Has patient had a PCN reaction occurring within the last 10 years: No If all of the above answers are "NO", then may proceed with Cephalosporin use.   . Actos [Pioglitazone]     Not effective   . Amaryl [Glimepiride]     Not effective    . Atorvastatin Other (See Comments)    Muscle aches & All statins per Patient  . Bentyl [Dicyclomine Hcl]     Could not tolerate nausea, reduced concentration, h/a   . Ciprofloxacin Other (See Comments)    High blood pressure    . Codeine Nausea And Vomiting  . Ezetimibe-Simvastatin Other (See Comments)    Muscle aches, nausea, back pain   . Fosamax [Alendronate]     dysphagia  . Lovastatin Other (See Comments)    Myalgias  . Macrobid [Nitrofurantoin Macrocrystal]     Severe Itching, rash   . Protonix [Pantoprazole Sodium]     Esophageal problems    . Rosiglitazone Maleate Other (See Comments)    Edema  . Statins     Muscle and joint aches to all statins  . Victoza [Liraglutide]     Nausea   . Alendronate Sodium Rash    Dysphagia and ulceration   . Bactrim [Sulfamethoxazole-Trimethoprim] Rash    itching  . Sulfa Antibiotics  Rash    Itching     Medications Reviewed Today    Reviewed by De Hollingshead, RPH-CPP (Pharmacist) on 03/29/20 at [place] List Status: <None>  Medication Order Taking? Sig Documenting Provider Last Dose Status Informant  acetaminophen (TYLENOL) 500 MG tablet [id number]  Take 1,000 mg by mouth every 6 (six) hours as needed for moderate pain or headache. [provider]  Active Self  Ascorbic Acid (VITAMIN C) POWD [id number] Yes Take 200 mg by mouth daily.  [provider] Taking Active Self  bisoprolol (ZEBETA) 5 MG  tablet [id number] Yes Take 1 tablet (5 mg total) by mouth daily. Minna Merritts, MD Taking Active   co-enzyme Q-10 30 MG capsule [id number] Yes Take 30 mg by mouth daily.  [provider] Taking Active   estradiol (ESTRACE) 0.1 MG/GM vaginal cream [id number] Yes APPLY 0.5MG  (PEA SIZED AMOUNT) JUST INSIDE THE VAGINA WITH FINGER-TIP ON MONDAY, WEDNESDAY AND FRIDAY NIGHTS. [name], PA-C Taking Active   furosemide (LASIX) 20 MG tablet [id number] Yes Take 1 tablet (20 mg total) by mouth 2 (two) times daily. Minna Merritts, MD Taking Active   glucose blood (ONE TOUCH ULTRA TEST) test strip [id number] Yes Use to test blood sugar once daily E11.49  Patient taking differently: Use to test blood sugar twice daily E11.49   Bedsole, Amy E, MD Taking Active Self  levothyroxine (SYNTHROID, LEVOTHROID) 50 MCG tablet [id number] Yes Take 50 mcg by mouth daily before breakfast. [provider] Taking Active Self  losartan (COZAAR) 25 MG tablet [id number] Yes Take 0.5 tablets (12.5 mg total) by mouth daily.  Patient taking differently: Take 12.5 mg by mouth daily. Take if BP >110 sbp   Gollan, Kathlene November, MD Taking Active   magnesium oxide (MAG-OX) 400 MG tablet [id number] Yes Take 400 mg by mouth daily. [provider] Taking Active   metFORMIN (GLUCOPHAGE-XR) 500 MG 24 hr tablet [id number] Yes Take 500 mg by mouth 2 (two) times daily.  [provider] Taking Active   Multiple Vitamin (CALCIUM COMPLEX PO) [id number] Yes Take 1 tablet by mouth daily. [provider] Taking Active Self  Multiple Vitamins-Minerals (MULTIVITAMIN GUMMIES ADULT PO) [id number] Yes Take 1 each by mouth daily. [provider] Taking Active   NON FORMULARY [id number] Yes Trivita Supplement B12 Takes qd. B6 qd, and folic acid [provider] Taking Active   Olopatadine HCl 0.2 % SOLN [id number] Yes 1 drop once daily [provider] Taking Active   Omega-3 1000 MG CAPS  [id number] Yes Take 2,000 mg by mouth daily.  [provider] Taking Active Self  potassium chloride (KLOR-CON) 10 MEQ tablet [id number] Yes Take 1 tablet (10 mEq total) by mouth 2 (two) times daily. Minna Merritts, MD Taking Active   prednisoLONE acetate (PRED FORTE) 1 % ophthalmic suspension [id number] Yes  [provider] Taking Active   Probiotic Product (ALIGN PO) [id number] Yes Take 1 capsule by mouth daily. [provider] Taking Active   psyllium (METAMUCIL) 58.6 % packet [id number] Yes Take 1 packet by mouth daily.  [provider] Taking Active   SANTYL ointment [id number] Yes Apply 1 application topically daily. Felipa Furnace, DPM Taking Active   XARELTO 20 MG TABS tablet [id number] Yes Take 1 tablet (20 mg total) by mouth daily with supper. Minna Merritts, MD Taking Active           Patient Active Problem List   Diagnosis Date Noted  .  Hypertension associated with diabetes ([place]) 03/15/2020  . Obesity (BMI 30-39.9) 03/15/2020  . Coronary artery disease involving native coronary artery of native heart without angina pectoris 03/15/2020  . PAD (peripheral artery disease) ([place]) 08/14/2019  . Foot ulcer ([place]) 06/19/2019  . Lymphedema 06/09/2019  . Chronic venous insufficiency 06/09/2019  . Diabetic retinopathy associated with type 2 diabetes mellitus ([place]) 01/20/2019  . Low back pain 09/24/2018  . Overactive bladder 04/10/2018  . History of UTI 04/10/2018  . Hematuria 04/10/2018  . Diarrhea 04/10/2018  . Venous ulcer of leg ([place]) 04/10/2018  . Status post endoscopic carpal tunnel release 03/19/2018  . History of skin cancer 11/08/2017  . Chronic anticoagulation 06/11/2017  . Aortic atherosclerosis ([place]) 05/08/2017  . Coronary artery disease, non-occlusive 03/29/2017  . Persistent atrial fibrillation ([place]) 03/29/2017  . Carpal tunnel syndrome 03/28/2017  . Cervical radiculitis 03/28/2017  . Problems with swallowing and mastication   .  Esophageal candidiasis ([place])   . Stricture and stenosis of esophagus 05/02/2016  . TIA (transient ischemic attack) 06/24/2015  . 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 Humeral head fracture 10/06/2014  . Chest pain 03/27/2014  . Shortness of breath 03/27/2014  . Chronic diastolic CHF (congestive heart failure) ([place]) 03/27/2014  . Advanced directives, counseling/discussion 02/24/2014  . Routine general medical examination at a health care facility 02/17/2013  . Diabetes, polyneuropathy ([place]) 02/17/2013  . Type 2 diabetes mellitus with diabetic polyneuropathy, without long-term current use of insulin ([place]) 02/17/2013  . Gait instability 04/11/2012  . Edema 11/14/2010  . OTHER CONSTIPATION 05/31/2007  . VENTRAL HERNIA 03/15/2007  . Ovarian cancer ([place]) 11/20/2006  . Hypothyroidism 11/20/2006  . Type 2 diabetes mellitus with neurological manifestations, controlled ([place]) 11/20/2006  . Essential hypertension 11/20/2006  . Atrial fibrillation ([place]) 11/20/2006  . Osteoporosis 11/20/2006  . Hyperlipidemia due to type 2 diabetes mellitus ([place]) 11/20/2006    Conditions to be addressed/monitored per PCP order: HTN and DMII  Patient Care Plan: Medication Management    Problem Identified: Diabetes, HTN     Long-Range Goal: Disease Progression Prevention   Recent Progress: On track  Priority: High  Note:   Current Barriers:  . Unable to independently afford treatment regimen . Unable to achieve control of diabetes   Pharmacist Clinical Goal(s):  Marland Kitchen Over the next 90 days, patient will verbalize ability to afford treatment regimen. . Over the next 90 days, patient will maintain control of diabetes as evidenced by maintenance of goal A1c through collaboration with PharmD and provider.   Interventions: . Inter-disciplinary care team collaboration (see longitudinal plan of care) . Comprehensive medication review performed; medication list updated in electronic  medical record  Health Maintenance: . Recent respiratory infection and UTI. Seen by [facility] ED yesterday, prescribed levofloxacin (d/t allergies w/ ciprofloxacin, macrobid), no voice. Read the package information about levofloxacin and is worried about tendon rupture, worsening peripheral neuropathy. Notes arthralgias, but this has been occurring for the past few days, more likely related to her infection.  . Discussed that these side effects are most common with long term use with levofloxacin, and choice of antibiotic is limited by her history of medication intolerances and renal function. Encouraged to continue course of levofloxacin, as her reports of symptoms are more likely related to her infection + UTI than one dose of levofloxacin. Encouraged to take with food tonight and to call us back on Friday morning if her symptoms have not improved.  . Encouraged to call [facility] office  to scheduler sooner f/u, as question of structural/hormonal concerns contributing to UTI risk.   Diabetes: . Uncontrolled, though a more relaxed A1c goal likely appropriate; current treatment: metformin XR 500 mg BID (metformin dose limited by GI upset), follows w/ Dr. Honor Junes o Hx Lady Gary - reported bloating, GI upset (that did not completed resolve after discontinuation) o Hx pioglitazone/rosiglitazone - edema o Hx glimperide - lack of benefit o Discussion of GLP1, but was too expensive o Jardiance - declined d/t baseline urinary urgency   . Hx intolerance to statins and ezetimibe, though LDL appropriately controlled at this time given age . Have not heard back from Dr. Sherren Mocha office regarding pursuing patient assistance for GLP1. Will defer until I hear something back from them or patient's next appt with him in February.   Hypertension with Atrial Fibrillation with lymphedema . Controlled; current treatment: bisoprolol 5 mg daily, losartan 12.5 mg PRN SBP >110; furosemide 20 mg BID + potassium  10 mEq BID;  . Anticoagulant regimen: Xarelto 20 mg daily; follows w/ Dr. Rockey Situ . Current home readings: SBP 105-140s, took losartan every day last week, but didn't need this morning . Recommended to continue current regimen and collaboration with cardiology  Hypothyroidism . Controlled; current treatment: Synthroid 50 mcg daily . Recommended to continue current regimen at this time  Gastrointestinal Complaints - Diarrhea and Constipation . Uncontrolled; fluctuates between diarrhea and constipation, and notes this makes it more difficult to plan leaving the house  Urinary urgency/hx UTIs: . Appropriately controlled; current treatment: estradiol vaginal cream 3 days per week. Denies cost concerns at this time.  . Continue current regimen at this time.   Patient Goals/Self-Care Activities . Over the next 90 days, patient will:  - take medications as prescribed check blood glucose daily, document, and provide at future appointments check blood pressure daily, document, and provide at future appointments collaborate with provider on medication access solutions  Follow Up Plan: Telephone follow up appointment with care management team member scheduled for: ~ 2 weeks      Medication Assistance: None required. Patient affirms current coverage meets needs.   Plan: Telephone follow up appointment with care management team member scheduled for: ~ 2 weeks as previously scheduled   Catie Darnelle Maffucci, PharmD, [place], [place] Clinical Pharmacist [employer] at [facility] [phone]

## 2020-04-07 NOTE — Patient Instructions (Signed)
Visit Information  Patient Care Plan: Medication Management    Problem Identified: Diabetes, HTN     Long-Range Goal: Disease Progression Prevention   Recent Progress: On track  Priority: High  Note:   Current Barriers:  . Unable to independently afford treatment regimen . Unable to achieve control of diabetes   Pharmacist Clinical Goal(s):  Marland Kitchen Over the next 90 days, patient will verbalize ability to afford treatment regimen. . Over the next 90 days, patient will maintain control of diabetes as evidenced by maintenance of goal A1c through collaboration with PharmD and provider.   Interventions: . Inter-disciplinary care team collaboration (see longitudinal plan of care) . Comprehensive medication review performed; medication list updated in electronic medical record  Health Maintenance: . Recent respiratory infection and UTI. Seen by [facility] ED yesterday, prescribed levofloxacin (d/t allergies w/ ciprofloxacin, macrobid), no voice. Read the package information about levofloxacin and is worried about tendon rupture, worsening peripheral neuropathy. Notes arthralgias, but this has been occurring for the past few days, more likely related to her infection.  . Discussed that these side effects are most common with long term use with levofloxacin, and choice of antibiotic is limited by her history of medication intolerances and renal function. Encouraged to continue course of levofloxacin, as her reports of symptoms are more likely related to her infection + UTI than one dose of levofloxacin. Encouraged to take with food tonight and to call us back on Friday morning if her symptoms have not improved.  . Encouraged to call [facility] McGowan's office to scheduler sooner f/u, as question of structural/hormonal concerns contributing to UTI risk.   Diabetes: . Uncontrolled, though a more relaxed A1c goal likely appropriate; current treatment: metformin XR 500 mg BID (metformin dose limited by GI  upset), follows w/ Dr. Honor Junes o Hx Lady Gary - reported bloating, GI upset (that did not completed resolve after discontinuation) o Hx pioglitazone/rosiglitazone - edema o Hx glimperide - lack of benefit o Discussion of GLP1, but was too expensive o Jardiance - declined d/t baseline urinary urgency   . Hx intolerance to statins and ezetimibe, though LDL appropriately controlled at this time given age . Have not heard back from Dr. Sherren Mocha office regarding pursuing patient assistance for GLP1. Will defer until I hear something back from them or patient's next appt with him in February.   Hypertension with Atrial Fibrillation with lymphedema . Controlled; current treatment: bisoprolol 5 mg daily, losartan 12.5 mg PRN SBP >110; furosemide 20 mg BID + potassium 10 mEq BID;  . Anticoagulant regimen: Xarelto 20 mg daily; follows w/ Dr. Rockey Situ . Current home readings: SBP 105-140s, took losartan every day last week, but didn't need this morning . Recommended to continue current regimen and collaboration with cardiology  Hypothyroidism . Controlled; current treatment: Synthroid 50 mcg daily . Recommended to continue current regimen at this time  Gastrointestinal Complaints - Diarrhea and Constipation . Uncontrolled; fluctuates between diarrhea and constipation, and notes this makes it more difficult to plan leaving the house  Urinary urgency/hx UTIs: . Appropriately controlled; current treatment: estradiol vaginal cream 3 days per week. Denies cost concerns at this time.  . Continue current regimen at this time.   Patient Goals/Self-Care Activities . Over the next 90 days, patient will:  - take medications as prescribed check blood glucose daily, document, and provide at future appointments check blood pressure daily, document, and provide at future appointments collaborate with provider on medication access solutions  Follow Up Plan: Telephone follow up appointment  with care  management team member scheduled for: ~ 2 weeks       The patient verbalized understanding of instructions, educational materials, and care plan provided today and declined offer to receive copy of patient instructions, educational materials, and care plan.   Plan: Telephone follow up appointment with care management team member scheduled for: ~ 2 weeks as previously scheduled   Catie Darnelle Maffucci, PharmD, [place], [place] Clinical Pharmacist [employer] at [facility] [phone]

## 2020-04-07 NOTE — Telephone Encounter (Signed)
Pt called returning your call 

## 2020-04-07 NOTE — Telephone Encounter (Signed)
LVM for patient to return my call at her convenience

## 2020-04-08 ENCOUNTER — Encounter: Payer: Self-pay | Admitting: *Deleted

## 2020-04-08 ENCOUNTER — Other Ambulatory Visit: Payer: Self-pay

## 2020-04-08 DIAGNOSIS — N390 Urinary tract infection, site not specified: Secondary | ICD-10-CM | POA: Diagnosis present

## 2020-04-08 DIAGNOSIS — K219 Gastro-esophageal reflux disease without esophagitis: Secondary | ICD-10-CM | POA: Diagnosis present

## 2020-04-08 DIAGNOSIS — W19XXXA Unspecified fall, initial encounter: Secondary | ICD-10-CM | POA: Diagnosis present

## 2020-04-08 DIAGNOSIS — Z9842 Cataract extraction status, left eye: Secondary | ICD-10-CM

## 2020-04-08 DIAGNOSIS — K5900 Constipation, unspecified: Secondary | ICD-10-CM | POA: Diagnosis present

## 2020-04-08 DIAGNOSIS — Z8249 Family history of ischemic heart disease and other diseases of the circulatory system: Secondary | ICD-10-CM

## 2020-04-08 DIAGNOSIS — I2510 Atherosclerotic heart disease of native coronary artery without angina pectoris: Secondary | ICD-10-CM | POA: Diagnosis present

## 2020-04-08 DIAGNOSIS — E039 Hypothyroidism, unspecified: Secondary | ICD-10-CM | POA: Diagnosis present

## 2020-04-08 DIAGNOSIS — Z882 Allergy status to sulfonamides status: Secondary | ICD-10-CM

## 2020-04-08 DIAGNOSIS — R110 Nausea: Secondary | ICD-10-CM | POA: Diagnosis not present

## 2020-04-08 DIAGNOSIS — Z20822 Contact with and (suspected) exposure to covid-19: Secondary | ICD-10-CM | POA: Diagnosis present

## 2020-04-08 DIAGNOSIS — Z8543 Personal history of malignant neoplasm of ovary: Secondary | ICD-10-CM

## 2020-04-08 DIAGNOSIS — R197 Diarrhea, unspecified: Secondary | ICD-10-CM | POA: Diagnosis present

## 2020-04-08 DIAGNOSIS — E1142 Type 2 diabetes mellitus with diabetic polyneuropathy: Secondary | ICD-10-CM | POA: Diagnosis present

## 2020-04-08 DIAGNOSIS — S22080A Wedge compression fracture of T11-T12 vertebra, initial encounter for closed fracture: Secondary | ICD-10-CM | POA: Diagnosis not present

## 2020-04-08 DIAGNOSIS — B957 Other staphylococcus as the cause of diseases classified elsewhere: Secondary | ICD-10-CM | POA: Diagnosis present

## 2020-04-08 DIAGNOSIS — E785 Hyperlipidemia, unspecified: Secondary | ICD-10-CM | POA: Diagnosis present

## 2020-04-08 DIAGNOSIS — Z90722 Acquired absence of ovaries, bilateral: Secondary | ICD-10-CM

## 2020-04-08 DIAGNOSIS — Z833 Family history of diabetes mellitus: Secondary | ICD-10-CM

## 2020-04-08 DIAGNOSIS — Z880 Allergy status to penicillin: Secondary | ICD-10-CM

## 2020-04-08 DIAGNOSIS — N200 Calculus of kidney: Secondary | ICD-10-CM | POA: Diagnosis present

## 2020-04-08 DIAGNOSIS — Z8261 Family history of arthritis: Secondary | ICD-10-CM

## 2020-04-08 DIAGNOSIS — Z888 Allergy status to other drugs, medicaments and biological substances status: Secondary | ICD-10-CM

## 2020-04-08 DIAGNOSIS — I4819 Other persistent atrial fibrillation: Secondary | ICD-10-CM | POA: Diagnosis present

## 2020-04-08 DIAGNOSIS — D696 Thrombocytopenia, unspecified: Secondary | ICD-10-CM | POA: Diagnosis present

## 2020-04-08 DIAGNOSIS — Z7984 Long term (current) use of oral hypoglycemic drugs: Secondary | ICD-10-CM

## 2020-04-08 DIAGNOSIS — Z885 Allergy status to narcotic agent status: Secondary | ICD-10-CM

## 2020-04-08 DIAGNOSIS — I5032 Chronic diastolic (congestive) heart failure: Secondary | ICD-10-CM | POA: Diagnosis present

## 2020-04-08 DIAGNOSIS — R627 Adult failure to thrive: Secondary | ICD-10-CM | POA: Diagnosis present

## 2020-04-08 DIAGNOSIS — E1151 Type 2 diabetes mellitus with diabetic peripheral angiopathy without gangrene: Secondary | ICD-10-CM | POA: Diagnosis present

## 2020-04-08 DIAGNOSIS — Z825 Family history of asthma and other chronic lower respiratory diseases: Secondary | ICD-10-CM

## 2020-04-08 DIAGNOSIS — E669 Obesity, unspecified: Secondary | ICD-10-CM | POA: Diagnosis present

## 2020-04-08 DIAGNOSIS — R296 Repeated falls: Secondary | ICD-10-CM | POA: Diagnosis present

## 2020-04-08 DIAGNOSIS — Z8673 Personal history of transient ischemic attack (TIA), and cerebral infarction without residual deficits: Secondary | ICD-10-CM

## 2020-04-08 DIAGNOSIS — R5381 Other malaise: Secondary | ICD-10-CM | POA: Diagnosis present

## 2020-04-08 DIAGNOSIS — Z9841 Cataract extraction status, right eye: Secondary | ICD-10-CM

## 2020-04-08 DIAGNOSIS — Z96612 Presence of left artificial shoulder joint: Secondary | ICD-10-CM | POA: Diagnosis present

## 2020-04-08 DIAGNOSIS — Z83438 Family history of other disorder of lipoprotein metabolism and other lipidemia: Secondary | ICD-10-CM

## 2020-04-08 DIAGNOSIS — Z79890 Hormone replacement therapy (postmenopausal): Secondary | ICD-10-CM

## 2020-04-08 DIAGNOSIS — Z823 Family history of stroke: Secondary | ICD-10-CM

## 2020-04-08 DIAGNOSIS — I110 Hypertensive heart disease with heart failure: Secondary | ICD-10-CM | POA: Diagnosis present

## 2020-04-08 DIAGNOSIS — Z79899 Other long term (current) drug therapy: Secondary | ICD-10-CM

## 2020-04-08 DIAGNOSIS — Z7901 Long term (current) use of anticoagulants: Secondary | ICD-10-CM

## 2020-04-08 DIAGNOSIS — E876 Hypokalemia: Secondary | ICD-10-CM | POA: Diagnosis present

## 2020-04-08 DIAGNOSIS — Z9049 Acquired absence of other specified parts of digestive tract: Secondary | ICD-10-CM

## 2020-04-08 DIAGNOSIS — Z800 Family history of malignant neoplasm of digestive organs: Secondary | ICD-10-CM

## 2020-04-08 DIAGNOSIS — Z6830 Body mass index (BMI) 30.0-30.9, adult: Secondary | ICD-10-CM

## 2020-04-08 DIAGNOSIS — Z90710 Acquired absence of both cervix and uterus: Secondary | ICD-10-CM

## 2020-04-08 LAB — CBC
HCT: 36.2 % (ref 36.0–46.0)
Hemoglobin: 12.2 g/dL (ref 12.0–15.0)
MCH: 33.4 pg (ref 26.0–34.0)
MCHC: 33.7 g/dL (ref 30.0–36.0)
MCV: 99.2 fL (ref 80.0–100.0)
Platelets: 200 10*3/uL (ref 150–400)
RBC: 3.65 MIL/uL — ABNORMAL LOW (ref 3.87–5.11)
RDW: 13.2 % (ref 11.5–15.5)
WBC: 12.1 10*3/uL — ABNORMAL HIGH (ref 4.0–10.5)
nRBC: 0 % (ref 0.0–0.2)

## 2020-04-08 LAB — COMPREHENSIVE METABOLIC PANEL
ALT: 11 U/L (ref 0–44)
AST: 33 U/L (ref 15–41)
Albumin: 3.7 g/dL (ref 3.5–5.0)
Alkaline Phosphatase: 69 U/L (ref 38–126)
Anion gap: 13 (ref 5–15)
BUN: 30 mg/dL — ABNORMAL HIGH (ref 8–23)
CO2: 26 mmol/L (ref 22–32)
Calcium: 9.5 mg/dL (ref 8.9–10.3)
Chloride: 97 mmol/L — ABNORMAL LOW (ref 98–111)
Creatinine, Ser: 0.94 mg/dL (ref 0.44–1.00)
GFR, Estimated: 58 mL/min — ABNORMAL LOW (ref >60)
Glucose, Bld: 204 mg/dL — ABNORMAL HIGH (ref 70–99)
Potassium: 4 mmol/L (ref 3.5–5.1)
Sodium: 136 mmol/L (ref 135–145)
Total Bilirubin: 1.1 mg/dL (ref 0.3–1.2)
Total Protein: 8 g/dL (ref 6.5–8.1)

## 2020-04-08 LAB — URINALYSIS, COMPLETE (UACMP) WITH MICROSCOPIC
Bacteria, UA: NONE SEEN
Bilirubin Urine: NEGATIVE
Glucose, UA: NEGATIVE mg/dL
Hgb urine dipstick: NEGATIVE
Ketones, ur: NEGATIVE mg/dL
Leukocytes,Ua: NEGATIVE
Nitrite: NEGATIVE
Protein, ur: NEGATIVE mg/dL
Specific Gravity, Urine: 1.011 (ref 1.005–1.030)
Squamous Epithelial / HPF: NONE SEEN (ref 0–5)
pH: 5 (ref 5.0–8.0)

## 2020-04-08 LAB — LIPASE, BLOOD: Lipase: 21 U/L (ref 11–51)

## 2020-04-08 NOTE — ED Triage Notes (Signed)
Pt to ED reporting lower black, bilateral flank pain and lower abd pain. PT reports she usually has diarrhea but recently has been constipated. Pt able to pass a very small amount of small stool this afternoon but continues to have pain and abd distension. Hx of SBO. Pain increases with movement and sitting up.   Pt was dx with UTI 2 weeks ago and reports has since had increased weakness at home and is stating fears that she is failing to thrive.   HX of Afib

## 2020-04-09 ENCOUNTER — Emergency Department: Payer: Medicare HMO

## 2020-04-09 ENCOUNTER — Inpatient Hospital Stay
Admission: EM | Admit: 2020-04-09 | Discharge: 2020-04-19 | DRG: 516 | Disposition: A | Discharge status: Skilled Nursing Facility | Payer: Medicare HMO | Attending: Internal Medicine | Admitting: Internal Medicine

## 2020-04-09 DIAGNOSIS — D72829 Elevated white blood cell count, unspecified: Secondary | ICD-10-CM

## 2020-04-09 DIAGNOSIS — R627 Adult failure to thrive: Secondary | ICD-10-CM

## 2020-04-09 DIAGNOSIS — R109 Unspecified abdominal pain: Secondary | ICD-10-CM | POA: Diagnosis present

## 2020-04-09 DIAGNOSIS — K5900 Constipation, unspecified: Secondary | ICD-10-CM

## 2020-04-09 DIAGNOSIS — R1032 Left lower quadrant pain: Secondary | ICD-10-CM | POA: Diagnosis not present

## 2020-04-09 DIAGNOSIS — I4811 Longstanding persistent atrial fibrillation: Secondary | ICD-10-CM

## 2020-04-09 DIAGNOSIS — R638 Other symptoms and signs concerning food and fluid intake: Secondary | ICD-10-CM

## 2020-04-09 DIAGNOSIS — I5032 Chronic diastolic (congestive) heart failure: Secondary | ICD-10-CM | POA: Diagnosis present

## 2020-04-09 DIAGNOSIS — R1030 Lower abdominal pain, unspecified: Secondary | ICD-10-CM

## 2020-04-09 DIAGNOSIS — R262 Difficulty in walking, not elsewhere classified: Secondary | ICD-10-CM

## 2020-04-09 DIAGNOSIS — S22080A Wedge compression fracture of T11-T12 vertebra, initial encounter for closed fracture: Secondary | ICD-10-CM

## 2020-04-09 DIAGNOSIS — R52 Pain, unspecified: Secondary | ICD-10-CM

## 2020-04-09 DIAGNOSIS — I10 Essential (primary) hypertension: Secondary | ICD-10-CM | POA: Diagnosis present

## 2020-04-09 DIAGNOSIS — E1142 Type 2 diabetes mellitus with diabetic polyneuropathy: Secondary | ICD-10-CM | POA: Diagnosis present

## 2020-04-09 DIAGNOSIS — E039 Hypothyroidism, unspecified: Secondary | ICD-10-CM | POA: Diagnosis present

## 2020-04-09 DIAGNOSIS — R2681 Unsteadiness on feet: Secondary | ICD-10-CM | POA: Diagnosis present

## 2020-04-09 DIAGNOSIS — I4821 Permanent atrial fibrillation: Secondary | ICD-10-CM | POA: Diagnosis present

## 2020-04-09 DIAGNOSIS — Z419 Encounter for procedure for purposes other than remedying health state, unspecified: Secondary | ICD-10-CM

## 2020-04-09 DIAGNOSIS — R110 Nausea: Secondary | ICD-10-CM

## 2020-04-09 DIAGNOSIS — I739 Peripheral vascular disease, unspecified: Secondary | ICD-10-CM | POA: Diagnosis present

## 2020-04-09 DIAGNOSIS — I2510 Atherosclerotic heart disease of native coronary artery without angina pectoris: Secondary | ICD-10-CM | POA: Diagnosis present

## 2020-04-09 DIAGNOSIS — N390 Urinary tract infection, site not specified: Secondary | ICD-10-CM | POA: Diagnosis present

## 2020-04-09 LAB — GASTROINTESTINAL PANEL BY PCR, STOOL (REPLACES STOOL CULTURE)

## 2020-04-09 LAB — CBC
HCT: 33.9 % — ABNORMAL LOW (ref 36.0–46.0)
Hemoglobin: 11.3 g/dL — ABNORMAL LOW (ref 12.0–15.0)
MCH: 33.3 pg (ref 26.0–34.0)
MCHC: 33.3 g/dL (ref 30.0–36.0)
MCV: 100 fL (ref 80.0–100.0)
Platelets: 162 10*3/uL (ref 150–400)
RBC: 3.39 MIL/uL — ABNORMAL LOW (ref 3.87–5.11)
RDW: 13.2 % (ref 11.5–15.5)
WBC: 8.5 10*3/uL (ref 4.0–10.5)
nRBC: 0 % (ref 0.0–0.2)

## 2020-04-09 LAB — BASIC METABOLIC PANEL
Anion gap: 12 (ref 5–15)
BUN: 27 mg/dL — ABNORMAL HIGH (ref 8–23)
CO2: 24 mmol/L (ref 22–32)
Calcium: 8.6 mg/dL — ABNORMAL LOW (ref 8.9–10.3)
Chloride: 99 mmol/L (ref 98–111)
Creatinine, Ser: 0.74 mg/dL (ref 0.44–1.00)
GFR, Estimated: >60 mL/min (ref >60)
Glucose, Bld: 177 mg/dL — ABNORMAL HIGH (ref 70–99)
Potassium: 3.7 mmol/L (ref 3.5–5.1)
Sodium: 135 mmol/L (ref 135–145)

## 2020-04-09 LAB — LACTIC ACID, PLASMA: Lactic Acid, Venous: 1.7 mmol/L (ref 0.5–1.9)

## 2020-04-09 LAB — RESP PANEL BY RT-PCR (FLU A&B, COVID) ARPGX2
Influenza A by PCR: NEGATIVE
Influenza B by PCR: NEGATIVE
SARS Coronavirus 2 by RT PCR: NEGATIVE

## 2020-04-09 LAB — C DIFFICILE QUICK SCREEN W PCR REFLEX
C Diff antigen: NEGATIVE
C Diff interpretation: NOT DETECTED
C Diff toxin: NEGATIVE

## 2020-04-09 LAB — VITAMIN B12: Vitamin B-12: 1396 pg/mL — ABNORMAL HIGH (ref 180–914)

## 2020-04-09 MED ORDER — IOHEXOL 300 MG/ML  SOLN
75.0000 mL | Freq: Once | INTRAMUSCULAR | Status: AC | PRN
  Administered 2020-04-09: 75 mL via INTRAVENOUS

## 2020-04-09 MED ORDER — DOCUSATE SODIUM 100 MG PO CAPS
200.0000 mg | ORAL_CAPSULE | Freq: Two times a day (BID) | ORAL | Status: DC
  Filled 2020-04-09 (×2): qty 2

## 2020-04-09 MED ORDER — ACETAMINOPHEN 650 MG RE SUPP: 325.0000 mg | Freq: Four times a day (QID) | RECTAL | Status: DC | PRN

## 2020-04-09 MED ORDER — ONDANSETRON HCL 4 MG PO TABS: 4.0000 mg | ORAL_TABLET | Freq: Four times a day (QID) | ORAL | Status: DC | PRN

## 2020-04-09 MED ORDER — LEVOTHYROXINE SODIUM 50 MCG PO TABS
50.0000 ug | ORAL_TABLET | Freq: Every day | ORAL | Status: DC
  Administered 2020-04-10 – 2020-04-19 (×9): 50 ug via ORAL
  Filled 2020-04-09 (×9): qty 1

## 2020-04-09 MED ORDER — RIVAROXABAN 20 MG PO TABS
20.0000 mg | ORAL_TABLET | Freq: Every day | ORAL | Status: DC
Start: 2020-04-09 — End: 2020-04-10
  Administered 2020-04-09: 20 mg via ORAL
  Filled 2020-04-09 (×3): qty 1

## 2020-04-09 MED ORDER — FUROSEMIDE 20 MG PO TABS
20.0000 mg | ORAL_TABLET | Freq: Two times a day (BID) | ORAL | Status: DC
  Administered 2020-04-09 – 2020-04-19 (×20): 20 mg via ORAL
  Filled 2020-04-09 (×20): qty 1

## 2020-04-09 MED ORDER — MORPHINE SULFATE (PF) 2 MG/ML IV SOLN
2.0000 mg | INTRAVENOUS | Status: AC | PRN
  Administered 2020-04-12 – 2020-04-13 (×2): 2 mg via INTRAVENOUS
  Filled 2020-04-09 (×3): qty 1

## 2020-04-09 MED ORDER — MAGNESIUM CITRATE PO SOLN
1.0000 | Freq: Once | ORAL | Status: AC
  Administered 2020-04-09: 1 via ORAL
  Filled 2020-04-09: qty 296

## 2020-04-09 MED ORDER — SODIUM CHLORIDE 0.9 % IV SOLN: INTRAVENOUS | Status: AC

## 2020-04-09 MED ORDER — LEVOFLOXACIN IN D5W 750 MG/150ML IV SOLN
750.0000 mg | INTRAVENOUS | Status: DC
  Administered 2020-04-10: 750 mg via INTRAVENOUS
  Filled 2020-04-09: qty 150

## 2020-04-09 MED ORDER — LEVOFLOXACIN IN D5W 750 MG/150ML IV SOLN
750.0000 mg | Freq: Once | INTRAVENOUS | Status: AC
  Administered 2020-04-09: 750 mg via INTRAVENOUS
  Filled 2020-04-09: qty 150

## 2020-04-09 MED ORDER — PSYLLIUM 95 % PO PACK
1.0000 | PACK | Freq: Every day | ORAL | Status: DC
  Administered 2020-04-09 – 2020-04-14 (×5): 1 via ORAL
  Filled 2020-04-09 (×6): qty 1

## 2020-04-09 MED ORDER — BISOPROLOL FUMARATE 5 MG PO TABS
5.0000 mg | ORAL_TABLET | Freq: Every day | ORAL | Status: DC
  Administered 2020-04-09 – 2020-04-19 (×10): 5 mg via ORAL
  Filled 2020-04-09 (×12): qty 1

## 2020-04-09 MED ORDER — SODIUM CHLORIDE 0.9 % IV BOLUS
500.0000 mL | Freq: Once | INTRAVENOUS | Status: AC
  Administered 2020-04-09: 500 mL via INTRAVENOUS

## 2020-04-09 MED ORDER — KETOROLAC TROMETHAMINE 30 MG/ML IJ SOLN
15.0000 mg | Freq: Once | INTRAMUSCULAR | Status: AC
  Administered 2020-04-09: 15 mg via INTRAVENOUS
  Filled 2020-04-09: qty 1

## 2020-04-09 MED ORDER — BISACODYL 5 MG PO TBEC: 10.0000 mg | DELAYED_RELEASE_TABLET | Freq: Every day | ORAL | Status: DC | PRN

## 2020-04-09 MED ORDER — ADULT MULTIVITAMIN W/MINERALS CH
1.0000 | ORAL_TABLET | Freq: Every day | ORAL | Status: DC
  Administered 2020-04-09 – 2020-04-19 (×10): 1 via ORAL
  Filled 2020-04-09 (×10): qty 1

## 2020-04-09 MED ORDER — ASCORBIC ACID 500 MG PO TABS
250.0000 mg | ORAL_TABLET | Freq: Every day | ORAL | Status: DC
  Administered 2020-04-09 – 2020-04-19 (×10): 250 mg via ORAL
  Filled 2020-04-09 (×7): qty 1
  Filled 2020-04-09: qty 0.5
  Filled 2020-04-09 (×2): qty 1

## 2020-04-09 MED ORDER — ACETAMINOPHEN 325 MG PO TABS
325.0000 mg | ORAL_TABLET | Freq: Four times a day (QID) | ORAL | Status: DC | PRN
  Administered 2020-04-09 – 2020-04-10 (×3): 325 mg via ORAL
  Filled 2020-04-09 (×3): qty 1

## 2020-04-09 MED ORDER — ONDANSETRON HCL 4 MG/2ML IJ SOLN
4.0000 mg | Freq: Four times a day (QID) | INTRAMUSCULAR | Status: DC | PRN
  Administered 2020-04-15: 4 mg via INTRAVENOUS
  Filled 2020-04-09: qty 2

## 2020-04-09 MED ORDER — LOSARTAN POTASSIUM 25 MG PO TABS
12.5000 mg | ORAL_TABLET | Freq: Every day | ORAL | Status: DC
  Administered 2020-04-09 – 2020-04-19 (×11): 12.5 mg via ORAL
  Filled 2020-04-09 (×5): qty 1
  Filled 2020-04-09: qty 0.5
  Filled 2020-04-09 (×5): qty 1

## 2020-04-09 NOTE — ED Notes (Signed)
Admission MD at bedside.  

## 2020-04-09 NOTE — H&P (Signed)
History and Physical   Jodi Reeves JTT:[id number] DOB: 11-Jun-1927 DOA: 04/09/2020  PCP: [name], Nino Glow, MD  Outpatient Specialists: Dr. Rockey Situ, cardiology Patient coming from: Home  I have personally briefly reviewed patient's old medical records in [place].  Chief Concern: Abdominal pain  HPI: Jodi Reeves is a 86 y.o. female with medical history significant for persistent atrial fibrillation since 2005 on Xarelto, hypertension, bilateral lymphedema, hypothyroid, presented to the emergency department for chief concerns of abdominal pain.  She endorses left lower quadrant abdominal pain that started about two weeks. The pain is minimally reduced with acetaminophen and bowel movements. She endorses dysuria and this has improved minimally with antibiotics.  She has had to increase her tylenol to two pills in an attempt to relieve the pain.   She further endorses worsening bilateral lower extremity leg pain has been ongoing since September 2021. She reports that at baseline, she ambulates with cane and the last time this was normal was about two weeks ago.  She reports that now her legs hurt so much she has difficulty walking.  ROS was negative for headache, fever, cough, chills, vomiting. She endorses nausea that is persistent. She endorses not being able to use her bilateral leg pumps and has not been able to because of pain in her lower extremities and extreme weakness.   Social history: Patient lives alone.  She is currently retired and formerly worked as a Marine scientist.  She denies tobacco, EtOH, recreational drug use.  ED Course: Discussed with ED provider, admitting patient for abdominal pain with leukocytosis and worsening weakness concerns for debility would need PT, OT  Review of Systems: As per HPI otherwise 10 point review of systems negative.  Assessment/Plan  Principal Problem:   Abdominal pain Active Problems:   Hypothyroidism   Essential hypertension    Atrial fibrillation (HCC)   Gait instability   Diabetes, polyneuropathy (HCC)   Chronic diastolic CHF (congestive heart failure) (HCC)   Coronary artery disease, non-occlusive   Type 2 diabetes mellitus with diabetic polyneuropathy, without long-term current use of insulin (HCC)   PAD (peripheral artery disease) (HCC)   Abdominal pain-query obstipation Overflow diarrhea -Magnesium citrate ordered -If patient does not have bowel movement would recommend primary team to order enema -If patient does not respond to enema can do general surgery consultation outpatient for disimpaction  Generalized weakness with bilateral upper and lower extremity numbness and tingling-suspect secondary to neuropathy in setting of long-term Metformin use -Checking B12 and vitamin D  Leukocytosis-query reactive secondary to obstipation, continue with checking C. difficile and GI panel in setting of abdominal pain  Hypothyroid resumed home levothyroxine 50 mcg  Hypertension-controlled resumed home bisoprolol 5 mg daily and losartan 12.5 mg daily  UTI -one-time dose of levofloxacin, to complete 3-day course of ABX for uncomplicated UTI  Persistent atrial fibrillation-resumed home Xarelto 20 mg daily  Debility-PT, OT, transition of care manager  Chart reviewed.   04/06/20 diagnosed with UTI and discharged with levofloxacin.  UA obtained was positive for leukocytes and negative for nitrites.  Echo in chart last done was 10/24/2018 showed estimated EF of 55 to 60%, left ventricular diastolic Doppler parameters consistent with impaired relaxation, RVSP moderately elevated at 45.8 mmHg.  DVT prophylaxis: Xarelto 20 mg daily Code Status: Full code Diet: Heart healthy/carb modified Family Communication: Discussed and updated with son at bedside Disposition Plan: Pending clinical course Consults called: Transitional care manager Admission status: Observation  Past Medical History:  Diagnosis Date  .  Allergy    . Anxiety   . Balance disorder   . Chronic diastolic CHF (congestive heart failure) ([place])   . Colon polyps   . Coronary artery disease, non-occlusive    a. LHC 11/2003: 10% LAD stenosis, 20% LCx stenosis, and 40% RCA stenosis; b. nuc stress test 12/15: small region of mild ischemia in the apical region with WMA also noted in the apical region, EF 60%. She declined invasive evaluation at that time  . DM2 (diabetes mellitus, type 2) ([place])   . Fall    04/2017 see care[facility]where [facility] multiple fractures, brusing   . Falls frequently    h/o left shoudler/wrist fracture and left fingers numb  . GERD (gastroesophageal reflux disease)    esophageal web  . 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 History of chicken pox   . History of eating disorder   . HLD (hyperlipidemia)   . HTN (hypertension)   . Hypothyroidism   . IBS (irritable bowel syndrome)    diarrhea   . Incontinence of bowel   . Osteoporosis   . Ovarian cancer ([place])    1980  . Persistent atrial fibrillation ([place])    a. CHADS2VASc = > 8 (CHF, HTN, age x 2, DM, TIA x 2, female); b. on Xarelto  . SBO (small bowel obstruction) ([place])    1998  . TIA (transient ischemic attack)   . UTI (urinary tract infection)   . Venous insufficiency    Past Surgical History:  Procedure Laterality Date  . 2nd look laparotomy  1982  . ABDOMINAL HYSTERECTOMY     for ovarian cancer s/p hysterectomy total 1976 and exp lap 1980  . APPENDECTOMY    . CARDIAC CATHETERIZATION  8/05   neg; a fib found   . CARPAL TUNNEL RELEASE Left 03/06/2018   Procedure: CARPAL TUNNEL RELEASE ENDOSCOPIC;  Surgeon: Corky Mull, MD;  Location: [place];  Service: Orthopedics;  Laterality: Left;  diabetic - oral meds  . CARPAL TUNNEL RELEASE     left CTS Dr.Poggi  . cataract OD  2002  . cataract surgery  5/07   R  . CHOLECYSTECTOMY  1986  . DEXA  9/04 and 1/02  . EGD/dilation/colon  1/06  . ESOPHAGOGASTRODUODENOSCOPY (EGD) WITH PROPOFOL N/A  05/16/2016   Procedure: ESOPHAGOGASTRODUODENOSCOPY (EGD) WITH PROPOFOL with dilation;  Surgeon: Lucilla Lame, MD;  Location: [facility];  Service: Endoscopy;  Laterality: N/A;  . FEMORAL HERNIA REPAIR  1958   R  . fracture L elbow/wrist  2000  . intussception/obstruction  12/98  . JOINT REPLACEMENT     left shoulder  . kidney stone x2  1991  . laminectomy L4-5  1971  . LUMBAR LAMINECTOMY     1970 ruptured disc   . MOUTH SURGERY    . myoview stress  8/05   syncope (-)  . REVERSE SHOULDER ARTHROPLASTY Left 10/06/2014   Procedure: REVERSE SHOULDER ARTHROPLASTY;  Surgeon: Corky Mull, MD;  Location: [facility];  Service: Orthopedics;  Laterality: Left;  . stillbirth  1969  . TOTAL ABDOMINAL HYSTERECTOMY W/ BILATERAL SALPINGOOPHORECTOMY  1980   ovarian cancer  . WRIST FRACTURE SURGERY  11/05   R   Social History:  reports that she has never smoked. She has never used smokeless tobacco. She reports that she does not drink alcohol and does not use drugs.  Allergies  Allergen Reactions  . Amiodarone Other (See Comments)    Severe Thyroid issues   .  Penicillins Anaphylaxis and Other (See Comments)    Has patient had a PCN reaction causing immediate rash, facial/tongue/throat swelling, SOB or lightheadedness with hypotension: Yes Has patient had a PCN reaction causing severe rash involving mucus membranes or skin necrosis: No Has patient had a PCN reaction that required hospitalization: No Has patient had a PCN reaction occurring within the last 10 years: No If all of the above answers are "NO", then may proceed with Cephalosporin use.   . Actos [Pioglitazone]     Not effective   . Amaryl [Glimepiride]     Not effective    . Atorvastatin Other (See Comments)    Muscle aches & All statins per Patient  . Bentyl [Dicyclomine Hcl]     Could not tolerate nausea, reduced concentration, h/a   . Ciprofloxacin Other (See Comments)    High blood pressure    . Codeine Nausea And Vomiting   . Ezetimibe-Simvastatin Other (See Comments)    Muscle aches, nausea, back pain   . Fosamax [Alendronate]     dysphagia  . Lovastatin Other (See Comments)    Myalgias  . Macrobid [Nitrofurantoin Macrocrystal]     Severe Itching, rash   . Protonix [Pantoprazole Sodium]     Esophageal problems    . Rosiglitazone Maleate Other (See Comments)    Edema  . Statins     Muscle and joint aches to all statins  . Victoza [Liraglutide]     Nausea   . Alendronate Sodium Rash    Dysphagia and ulceration   . Bactrim [Sulfamethoxazole-Trimethoprim] Rash    itching  . Sulfa Antibiotics Rash    Itching    Family History  Problem Relation Age of Onset  . Early death Father   . Diabetes Mother   . Heart disease Mother   . Arthritis Brother   . Cancer Brother   . Depression Brother   . Hearing loss Brother   . Arthritis Brother   . Cancer Brother        colon  . Hearing loss Brother   . Cancer Brother   . Diabetes Brother   . Hearing loss Brother   . Heart disease Brother   . Hyperlipidemia Brother   . Heart disease Sister   . Hypertension Sister   . Stroke Sister   . Hearing loss Daughter   . Hypertension Daughter   . Diabetes Paternal Grandfather   . Hearing loss Sister   . Heart disease Sister   . Arthritis Sister   . Depression Sister   . Hearing loss Sister   . Heart disease Sister   . COPD Brother   . Early death Brother   . Early death Brother    Family history: Family history reviewed and not pertinent  Prior to Admission medications   Medication Sig Start Date End Date Taking? Authorizing Provider  acetaminophen (TYLENOL) 500 MG tablet Take 1,000 mg by mouth every 6 (six) hours as needed for moderate pain or headache.    [provider]  Ascorbic Acid (VITAMIN C) POWD Take 200 mg by mouth daily.     [provider]  bisoprolol (ZEBETA) 5 MG tablet Take 1 tablet (5 mg total) by mouth daily. 08/25/19   Minna Merritts, MD  co-enzyme Q-10 30 MG  capsule Take 30 mg by mouth daily.     [provider]  estradiol (ESTRACE) 0.1 MG/GM vaginal cream APPLY 0.5MG  (PEA SIZED AMOUNT) JUST INSIDE THE VAGINA WITH FINGER-TIP ON MONDAY,  [facility] AND FRIDAY NIGHTS. 12/05/19   Zara Council A, PA-C  furosemide (LASIX) 20 MG tablet Take 1 tablet (20 mg total) by mouth 2 (two) times daily. 05/28/19   Minna Merritts, MD  glucose blood (ONE TOUCH ULTRA TEST) test strip Use to test blood sugar once daily E11.49 Patient taking differently: Use to test blood sugar twice daily E11.49 06/24/15   Bedsole, Macdonald Rigor E, MD  levothyroxine (SYNTHROID, LEVOTHROID) 50 MCG tablet Take 50 mcg by mouth daily before breakfast.    [provider]  losartan (COZAAR) 25 MG tablet Take 0.5 tablets (12.5 mg total) by mouth daily. Patient taking differently: Take 12.5 mg by mouth daily. Take if BP >110 sbp 01/12/20 04/11/20  Minna Merritts, MD  magnesium oxide (MAG-OX) 400 MG tablet Take 400 mg by mouth daily.    [provider]  metFORMIN (GLUCOPHAGE-XR) 500 MG 24 hr tablet Take 500 mg by mouth 2 (two) times daily.  06/17/19   [provider]  Multiple Vitamin (CALCIUM COMPLEX PO) Take 1 tablet by mouth daily.    [provider]  Multiple Vitamins-Minerals (MULTIVITAMIN GUMMIES ADULT PO) Take 1 each by mouth daily.    [provider]  NON FORMULARY [place] Supplement B12 Takes qd. B6 qd, and folic acid    [provider]  Olopatadine HCl 0.2 % SOLN 1 drop once daily    [provider]  Omega-3 1000 MG CAPS Take 2,000 mg by mouth daily.     [provider]  potassium chloride (KLOR-CON) 10 MEQ tablet Take 1 tablet (10 mEq total) by mouth 2 (two) times daily. 05/28/19   Minna Merritts, MD  prednisoLONE acetate (PRED FORTE) 1 % ophthalmic suspension  01/12/20   [provider]  Probiotic Product (ALIGN PO) Take 1 capsule by mouth daily.    [provider]  psyllium (METAMUCIL) 58.6 %  packet Take 1 packet by mouth daily.     [provider]  SANTYL ointment Apply 1 application topically daily. 08/19/19   Felipa Furnace, DPM  XARELTO 20 MG TABS tablet Take 1 tablet (20 mg total) by mouth daily with supper. 10/01/19   Minna Merritts, MD   Physical Exam: Vitals:   04/09/20 0430 04/09/20 0500 04/09/20 0530 04/09/20 0600  BP: (!) 144/77 (!) 144/90 109/73 131/81  Pulse: (!) 103 96 100 99  Resp: 18 16 13 12   Temp:      TempSrc:      SpO2: 96% 93% 92% 98%  Weight:      Height:       Constitutional: appears age-appropriate, NAD, calm, comfortable Eyes: PERRL, lids and conjunctivae normal ENMT: Mucous membranes are moist. Posterior pharynx clear of any exudate or lesions. Age-appropriate dentition. Hearing appropriate Neck: normal, supple, no masses, no thyromegaly Respiratory: clear to auscultation bilaterally, no wheezing, no crackles. Normal respiratory effort. No accessory muscle use.  Cardiovascular: Regular rate and rhythm, no murmurs / rubs / gallops. No extremity edema. 2+ pedal pulses. No carotid bruits.  Abdomen: no tenderness, no masses palpated, no hepatosplenomegaly. Bowel sounds positive.  Musculoskeletal: no clubbing / cyanosis. No joint deformity upper and lower extremities. Good ROM, no contractures, no atrophy. Normal muscle tone.  Skin: no rashes, lesions, ulcers. No induration Neurologic: Sensation intact. Strength 5/5 in all 4.  Psychiatric: Normal judgment and insight. Alert and oriented x 3. Normal mood.   EKG: Independently reviewed, showing atrial fibrillation with a rate of 99, QTc 461  CT  abdomen pelvis on Admission: Personally reviewed and I agree with radiologist reading as below.  CT ABDOMEN PELVIS W CONTRAST  Result Date: 04/09/2020 CLINICAL DATA:  86 year old female with lower abdominal, flank and back pain. EXAM: CT ABDOMEN AND PELVIS WITH CONTRAST TECHNIQUE: Multidetector CT imaging of the abdomen and pelvis was performed using  the standard protocol following bolus administration of intravenous contrast. CONTRAST:  60mL OMNIPAQUE IOHEXOL 300 MG/ML  SOLN COMPARISON:  CT Abdomen and Pelvis 01/21/2018. FINDINGS: Lower chest: Cardiomegaly has progressed since 2019. No pericardial effusion. Chronic lung base atelectasis or scarring not significantly changed. No pleural effusion. Hepatobiliary: Chronically absent gallbladder.  Negative liver. Pancreas: Chronic pancreatic atrophy. Spleen: Small benign appearing hypodense area in the spleen on series 2, image 16 is more apparent than 2019. Adrenals/Urinary Tract: Negative adrenal glands. Symmetric renal enhancement and contrast excretion. Normal proximal ureters. No hydronephrosis or nephrolithiasis. More distended bladder today, but the bladder is otherwise unremarkable. Stomach/Bowel: Redundant, decompressed large bowel in the pelvis. Redundant and increasingly gas distended transverse colon also with increased moderate to severe volume of retained stool which continues to the right colon. No large bowel inflammation identified. Terminal ileum is decompressed. Appendix not identified. No dilated small bowel. Decompressed stomach. Duodenum within normal limits. No free air, free fluid. Vascular/Lymphatic: Aortoiliac calcified atherosclerosis. Chronic surgical clips or embolization coils along the abdominal aorta and IVC are unchanged. Major arterial structures in the abdomen and pelvis remain patent. Portal venous system is patent. No lymphadenopathy. Reproductive: Surgically absent. Other: No pelvic free fluid. Musculoskeletal: Osteopenia. Mild T12 compression fracture is new since 2019 and un healed with visible comminuted fracture lucency (sagittal image 67). No retropulsion or complicating features. Elsewhere the visible spine is stable since 2019, including chronic lower sacral fractures. No other acute osseous abnormality identified. IMPRESSION: 1. Acute to subacute T12 compression fracture  with unhealed comminution. No retropulsion of bone or complicating features. Underlying osteopenia. 2. Gas and stool distended right and transverse colon, with redundant but decompressed distal large bowel. No mechanical bowel obstruction suspected. No inflamed bowel. Favor constipation. 3. Cardiomegaly has progressed since 2019. Extensive Aortic Atherosclerosis (ICD10-I70.0). Chronic pancreatic atrophy. 4. No other acute or significant abnormality identified in the abdomen or pelvis. Electronically Signed   By: Genevie Ann M.D.   On: 04/09/2020 04:59   Labs on Admission: I have personally reviewed following labs  CBC: Recent Labs  Lab 04/08/20 2013  WBC 12.1*  HGB 12.2  HCT 36.2  MCV 99.2  PLT 585   Basic Metabolic Panel: Recent Labs  Lab 04/08/20 2013  NA 136  K 4.0  CL 97*  CO2 26  GLUCOSE 204*  BUN 30*  CREATININE 0.94  CALCIUM 9.5   GFR: Estimated Creatinine Clearance: 39.4 mL/min (by C-G formula based on SCr of 0.94 mg/dL). Liver Function Tests: Recent Labs  Lab 04/08/20 2013  AST 33  ALT 11  ALKPHOS 69  BILITOT 1.1  PROT 8.0  ALBUMIN 3.7   Recent Labs  Lab 04/08/20 2013  LIPASE 21   Urine analysis:    Component Value Date/Time   COLORURINE YELLOW (A) 04/08/2020 2134   APPEARANCEUR CLEAR (A) 04/08/2020 2134   APPEARANCEUR Turbid (A) 09/26/2019 1011   LABSPEC 1.011 04/08/2020 2134   PHURINE 5.0 04/08/2020 2134   GLUCOSEU NEGATIVE 04/08/2020 2134   HGBUR NEGATIVE 04/08/2020 2134   HGBUR large 06/02/2010 1052   BILIRUBINUR NEGATIVE 04/08/2020 2134   BILIRUBINUR Negative 09/26/2019 [place] 04/08/2020 2134  PROTEINUR NEGATIVE 04/08/2020 2134   UROBILINOGEN 0.2 06/02/2010 1052   NITRITE NEGATIVE 04/08/2020 2134   LEUKOCYTESUR NEGATIVE 04/08/2020 2134   Jodi Reeves D.O. [facility]  If 12AM-7AM, please contact overnight-coverage provider If 7AM-7PM, please contact day coverage provider [email or web address]  04/09/2020, 6:12 AM

## 2020-04-09 NOTE — Progress Notes (Signed)
Patient arrived to C Pod in stable condition

## 2020-04-09 NOTE — ED Notes (Signed)
ED TO INPATIENT HANDOFF REPORT  ED Nurse Name and Phone #: Valetta Fuller [phone]  S Name/Age/Gender Jodi Reeves 86 y.o. female Room/Bed: ED31A/ED31A  Code Status   Code Status: Full Code  Home/SNF/Other Home Patient oriented to: self, place, time and situation Is this baseline? Yes   Triage Complete: Triage complete  Chief Complaint Abdominal pain [R10.9]  Triage Note Pt to ED reporting lower black, bilateral flank pain and lower abd pain. PT reports she usually has diarrhea but recently has been constipated. Pt able to pass a very small amount of small stool this afternoon but continues to have pain and abd distension. Hx of SBO. Pain increases with movement and sitting up.   Pt was dx with UTI 2 weeks ago and reports has since had increased weakness at home and is stating fears that she is failing to thrive.   HX of Afib     Allergies Allergies  Allergen Reactions  . Amiodarone Other (See Comments)    Severe Thyroid issues   . Penicillins Anaphylaxis and Other (See Comments)    Has patient had a PCN reaction causing immediate rash, facial/tongue/throat swelling, SOB or lightheadedness with hypotension: Yes Has patient had a PCN reaction causing severe rash involving mucus membranes or skin necrosis: No Has patient had a PCN reaction that required hospitalization: No Has patient had a PCN reaction occurring within the last 10 years: No If all of the above answers are "NO", then may proceed with Cephalosporin use.   . Actos [Pioglitazone]     Not effective   . Amaryl [Glimepiride]     Not effective    . Atorvastatin Other (See Comments)    Muscle aches & All statins per Patient  . Bentyl [Dicyclomine Hcl]     Could not tolerate nausea, reduced concentration, h/a   . Ciprofloxacin Other (See Comments)    High blood pressure    . Codeine Nausea And Vomiting  . Ezetimibe-Simvastatin Other (See Comments)    Muscle aches, nausea, back pain   . Fosamax [Alendronate]      dysphagia  . Lovastatin Other (See Comments)    Myalgias  . Macrobid [Nitrofurantoin Macrocrystal]     Severe Itching, rash   . Protonix [Pantoprazole Sodium]     Esophageal problems    . Rosiglitazone Maleate Other (See Comments)    Edema  . Statins     Muscle and joint aches to all statins  . Victoza [Liraglutide]     Nausea   . Alendronate Sodium Rash    Dysphagia and ulceration   . Bactrim [Sulfamethoxazole-Trimethoprim] Rash    itching  . Sulfa Antibiotics Rash    Itching     Level of Care/Admitting Diagnosis ED Disposition    ED Disposition Condition [facility] Area: [place] [[id number]]  Level of Care: Med-Surg [16]  Covid Evaluation: Asymptomatic Screening Protocol (No Symptoms)  Diagnosis: Abdominal pain [998338]  Admitting Physician: Criss Alvine [[id number]]  Attending Physician: COX, AMY N [id number]       B Medical/Surgery History Past Medical History:  Diagnosis Date  . Allergy   . Anxiety   . Balance disorder   . Chronic diastolic CHF (congestive heart failure) ([place])   . Colon polyps   . Coronary artery disease, non-occlusive    a. LHC 11/2003: 10% LAD stenosis, 20% LCx stenosis, and 40% RCA stenosis; b. nuc stress test 12/15: small region of mild ischemia in the apical region with  WMA also noted in the apical region, EF 60%. She declined invasive evaluation at that time  . DM2 (diabetes mellitus, type 2) ([place])   . Fall    04/2017 see careeverywhere [facility] multiple fractures, brusing   . Falls frequently    h/o left shoudler/wrist fracture and left fingers numb  . GERD (gastroesophageal reflux disease)    esophageal web  . 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 History of chicken pox   . History of eating disorder   . HLD (hyperlipidemia)   . HTN (hypertension)   . Hypothyroidism   . IBS (irritable bowel syndrome)    diarrhea   . Incontinence of bowel   . Osteoporosis   . Ovarian cancer ([place])     1980  . Persistent atrial fibrillation ([place])    a. CHADS2VASc = > 8 (CHF, HTN, age x 2, DM, TIA x 2, female); b. on Xarelto  . SBO (small bowel obstruction) ([place])    1998  . TIA (transient ischemic attack)   . UTI (urinary tract infection)   . Venous insufficiency    Past Surgical History:  Procedure Laterality Date  . 2nd look laparotomy  1982  . ABDOMINAL HYSTERECTOMY     for ovarian cancer s/p hysterectomy total 1976 and exp lap 1980  . APPENDECTOMY    . CARDIAC CATHETERIZATION  8/05   neg; a fib found   . CARPAL TUNNEL RELEASE Left 03/06/2018   Procedure: CARPAL TUNNEL RELEASE ENDOSCOPIC;  Surgeon: Corky Mull, MD;  Location: [place];  Service: Orthopedics;  Laterality: Left;  diabetic - oral meds  . CARPAL TUNNEL RELEASE     left CTS Dr.Poggi  . cataract OD  2002  . cataract surgery  5/07   R  . CHOLECYSTECTOMY  1986  . DEXA  9/04 and 1/02  . EGD/dilation/colon  1/06  . ESOPHAGOGASTRODUODENOSCOPY (EGD) WITH PROPOFOL N/A 05/16/2016   Procedure: ESOPHAGOGASTRODUODENOSCOPY (EGD) WITH PROPOFOL with dilation;  Surgeon: Lucilla Lame, MD;  Location: [facility];  Service: Endoscopy;  Laterality: N/A;  . FEMORAL HERNIA REPAIR  1958   R  . fracture L elbow/wrist  2000  . intussception/obstruction  12/98  . JOINT REPLACEMENT     left shoulder  . kidney stone x2  1991  . laminectomy L4-5  1971  . LUMBAR LAMINECTOMY     1970 ruptured disc   . MOUTH SURGERY    . myoview stress  8/05   syncope (-)  . REVERSE SHOULDER ARTHROPLASTY Left 10/06/2014   Procedure: REVERSE SHOULDER ARTHROPLASTY;  Surgeon: Corky Mull, MD;  Location: [facility];  Service: Orthopedics;  Laterality: Left;  . stillbirth  1969  . TOTAL ABDOMINAL HYSTERECTOMY W/ BILATERAL SALPINGOOPHORECTOMY  1980   ovarian cancer  . WRIST FRACTURE SURGERY  11/05   R     A IV Location/Drains/Wounds Patient Lines/Drains/Airways Status    Active Line/Drains/Airways    Name Placement date Placement time  Site Days   Peripheral IV 04/09/20 Right Antecubital 04/09/20  0256  Antecubital  less than 1   External Urinary Catheter 04/09/20  0450  --  less than 1          Intake/Output Last 24 hours  Intake/Output Summary (Last 24 hours) at 04/09/2020 1930 Last data filed at 04/09/2020 1026 Gross per 24 hour  Intake 150 ml  Output --  Net 150 ml    Labs/Imaging Results for orders placed or performed during the hospital  encounter of 04/09/20 (from the past 48 hour(s))  Lipase, blood     Status: None   Collection Time: 04/08/20  8:13 PM  Result Value Ref Range   Lipase 21 11 - 51 U/L    Comment: Performed at [facility], [address] [place], [place] [address]  Comprehensive metabolic panel     Status: Abnormal   Collection Time: 04/08/20  8:13 PM  Result Value Ref Range   Sodium 136 135 - 145 mmol/L   Potassium 4.0 3.5 - 5.1 mmol/L   Chloride 97 (L) 98 - 111 mmol/L   CO2 26 22 - 32 mmol/L   Glucose, Bld 204 (H) 70 - 99 mg/dL    Comment: Glucose reference range applies only to samples taken after fasting for at least 8 hours.   BUN 30 (H) 8 - 23 mg/dL   Creatinine, Ser 0.94 0.44 - 1.00 mg/dL   Calcium 9.5 8.9 - 10.3 mg/dL   Total Protein 8.0 6.5 - 8.1 g/dL   Albumin 3.7 3.5 - 5.0 g/dL   AST 33 15 - 41 U/L   ALT 11 0 - 44 U/L   Alkaline Phosphatase 69 38 - 126 U/L   Total Bilirubin 1.1 0.3 - 1.2 mg/dL   GFR, Estimated 58 (L) >60 mL/min    Comment: (NOTE) Calculated using the CKD-EPI Creatinine Equation (2021)    Anion gap 13 5 - 15    Comment: Performed at [facility], [address] [place], [place] [address]  CBC     Status: Abnormal   Collection Time: 04/08/20  8:13 PM  Result Value Ref Range   WBC 12.1 (H) 4.0 - 10.5 K/uL   RBC 3.65 (L) 3.87 - 5.11 MIL/uL   Hemoglobin 12.2 12.0 - 15.0 g/dL   HCT 36.2 36.0 - 46.0 %   MCV 99.2 80.0 - 100.0 fL   MCH 33.4 26.0 - 34.0 pg   MCHC 33.7 30.0 - 36.0 g/dL   RDW 13.2 11.5 - 15.5 %   Platelets 200 150  - 400 K/uL   nRBC 0.0 0.0 - 0.2 %    Comment: Performed at [facility], [address] [place], [place] [address]  Urinalysis, Complete w Microscopic     Status: Abnormal   Collection Time: 04/08/20  9:34 PM  Result Value Ref Range   Color, Urine YELLOW (A) YELLOW   APPearance CLEAR (A) CLEAR   Specific Gravity, Urine 1.011 1.005 - 1.030   pH 5.0 5.0 - 8.0   Glucose, UA NEGATIVE NEGATIVE mg/dL   Hgb urine dipstick NEGATIVE NEGATIVE   Bilirubin Urine NEGATIVE NEGATIVE   Ketones, ur NEGATIVE NEGATIVE mg/dL   Protein, ur NEGATIVE NEGATIVE mg/dL   Nitrite NEGATIVE NEGATIVE   Leukocytes,Ua NEGATIVE NEGATIVE   RBC / HPF 6-10 0 - 5 RBC/hpf   WBC, UA 6-10 0 - 5 WBC/hpf   Bacteria, UA NONE SEEN NONE SEEN   Squamous Epithelial / LPF NONE SEEN 0 - 5   Mucus PRESENT    Hyaline Casts, UA PRESENT     Comment: Performed at [facility], [address] [place], [place] [address]  Lactic acid, plasma     Status: None   Collection Time: 04/09/20  2:50 AM  Result Value Ref Range   Lactic Acid, Venous 1.7 0.5 - 1.9 mmol/L    Comment: Performed at [facility], [address] [place],  [address]  Resp Panel by RT-PCR (Flu A&B,  Covid) Nasopharyngeal Swab     Status: None   Collection Time: 04/09/20  3:01 AM   Specimen: Nasopharyngeal Swab; Nasopharyngeal(NP) swabs in vial transport medium  Result Value Ref Range   SARS Coronavirus 2 by RT PCR NEGATIVE NEGATIVE    Comment: (NOTE) SARS-CoV-2 target nucleic acids are NOT DETECTED.  The SARS-CoV-2 RNA is generally detectable in upper respiratory specimens during the acute phase of infection. The lowest concentration of SARS-CoV-2 viral copies this assay can detect is 138 copies/mL. A negative result does not preclude SARS-Cov-2 infection and should not be used as the sole basis for treatment or other patient management decisions. A negative result may occur with  improper specimen collection/handling,  submission of specimen other than nasopharyngeal swab, presence of viral mutation(s) within the areas targeted by this assay, and inadequate number of viral copies(<138 copies/mL). A negative result must be combined with clinical observations, patient history, and epidemiological information. The expected result is Negative.  Fact Sheet for Patients:  EntrepreneurPulse.com.au  Fact Sheet for Healthcare Providers:  IncredibleEmployment.be  This test is no t yet approved or cleared by the [place] FDA and  has been authorized for detection and/or diagnosis of SARS-CoV-2 by FDA under an Emergency Use Authorization (EUA). This EUA will remain  in effect (meaning this test can be used) for the duration of the COVID-19 declaration under Section 564(b)(1) of the Act, 21 U.S.C.section 360bbb-3(b)(1), unless the authorization is terminated  or revoked sooner.       Influenza A by PCR NEGATIVE NEGATIVE   Influenza B by PCR NEGATIVE NEGATIVE    Comment: (NOTE) The Xpert Xpress SARS-CoV-2/FLU/RSV plus assay is intended as an aid in the diagnosis of influenza from Nasopharyngeal swab specimens and should not be used as a sole basis for treatment. Nasal washings and aspirates are unacceptable for Xpert Xpress SARS-CoV-2/FLU/RSV testing.  Fact Sheet for Patients: EntrepreneurPulse.com.au  Fact Sheet for Healthcare Providers: IncredibleEmployment.be  This test is not yet approved or cleared by the [place] FDA and has been authorized for detection and/or diagnosis of SARS-CoV-2 by FDA under an Emergency Use Authorization (EUA). This EUA will remain in effect (meaning this test can be used) for the duration of the COVID-19 declaration under Section 564(b)(1) of the Act, 21 U.S.C. section 360bbb-3(b)(1), unless the authorization is terminated or revoked.  Performed at [facility], [address] [place], [place] [address]   Vitamin B12     Status: Abnormal   Collection Time: 04/09/20  8:37 AM  Result Value Ref Range   Vitamin B-12 1,396 (H) 180 - 914 pg/mL    Comment: (NOTE) This assay is not validated for testing neonatal or myeloproliferative syndrome specimens for Vitamin B12 levels. Performed at [facility], [place] [address], [place], [place] [address]   CBC     Status: Abnormal   Collection Time: 04/09/20  8:37 AM  Result Value Ref Range   WBC 8.5 4.0 - 10.5 K/uL   RBC 3.39 (L) 3.87 - 5.11 MIL/uL   Hemoglobin 11.3 (L) 12.0 - 15.0 g/dL   HCT 33.9 (L) 36.0 - 46.0 %   MCV 100.0 80.0 - 100.0 fL   MCH 33.3 26.0 - 34.0 pg   MCHC 33.3 30.0 - 36.0 g/dL   RDW 13.2 11.5 - 15.5 %   Platelets 162 150 - 400 K/uL   nRBC 0.0 0.0 - 0.2 %    Comment: Performed at [facility], [address] [place],  [place] [address]  Basic metabolic panel     Status: Abnormal   Collection Time: 04/09/20  8:37 AM  Result Value Ref Range   Sodium 135 135 - 145 mmol/L   Potassium 3.7 3.5 - 5.1 mmol/L   Chloride 99 98 - 111 mmol/L   CO2 24 22 - 32 mmol/L   Glucose, Bld 177 (H) 70 - 99 mg/dL    Comment: Glucose reference range applies only to samples taken after fasting for at least 8 hours.   BUN 27 (H) 8 - 23 mg/dL   Creatinine, Ser 0.74 0.44 - 1.00 mg/dL   Calcium 8.6 (L) 8.9 - 10.3 mg/dL   GFR, Estimated >60 >60 mL/min    Comment: (NOTE) Calculated using the CKD-EPI Creatinine Equation (2021)    Anion gap 12 5 - 15    Comment: Performed at [facility], [address], [place], [place] [address]  C Difficile Quick Screen w PCR reflex     Status: None   Collection Time: 04/09/20  2:44 PM   Specimen: STOOL  Result Value Ref Range   C Diff antigen NEGATIVE NEGATIVE   C Diff toxin NEGATIVE NEGATIVE   C Diff interpretation No C. difficile detected.     Comment: Performed at [facility], [address] [place], [place] [address]  Gastrointestinal Panel  by PCR , Stool     Status: None   Collection Time: 04/09/20  2:44 PM   Specimen: Stool  Result Value Ref Range   Campylobacter species NOT DETECTED NOT DETECTED   Plesimonas shigelloides NOT DETECTED NOT DETECTED   Salmonella species NOT DETECTED NOT DETECTED   Yersinia enterocolitica NOT DETECTED NOT DETECTED   Vibrio species NOT DETECTED NOT DETECTED   Vibrio cholerae NOT DETECTED NOT DETECTED   Enteroaggregative E coli (EAEC) NOT DETECTED NOT DETECTED   Enteropathogenic E coli (EPEC) NOT DETECTED NOT DETECTED   Enterotoxigenic E coli (ETEC) NOT DETECTED NOT DETECTED   Shiga like toxin producing E coli (STEC) NOT DETECTED NOT DETECTED   Shigella/Enteroinvasive E coli (EIEC) NOT DETECTED NOT DETECTED   Cryptosporidium NOT DETECTED NOT DETECTED   Cyclospora cayetanensis NOT DETECTED NOT DETECTED   Entamoeba histolytica NOT DETECTED NOT DETECTED   Giardia lamblia NOT DETECTED NOT DETECTED   Adenovirus F40/41 NOT DETECTED NOT DETECTED   Astrovirus NOT DETECTED NOT DETECTED   Norovirus GI/GII NOT DETECTED NOT DETECTED   Rotavirus A NOT DETECTED NOT DETECTED   Sapovirus (I, II, IV, and V) NOT DETECTED NOT DETECTED    Comment: Performed at [facility], [address] [place], [place] [address]   CT ABDOMEN PELVIS W CONTRAST  Result Date: 04/09/2020 CLINICAL DATA:  86 year old female with lower abdominal, flank and back pain. EXAM: CT ABDOMEN AND PELVIS WITH CONTRAST TECHNIQUE: Multidetector CT imaging of the abdomen and pelvis was performed using the standard protocol following bolus administration of intravenous contrast. CONTRAST:  52mL OMNIPAQUE IOHEXOL 300 MG/ML  SOLN COMPARISON:  CT Abdomen and Pelvis 01/21/2018. FINDINGS: Lower chest: Cardiomegaly has progressed since 2019. No pericardial effusion. Chronic lung base atelectasis or scarring not significantly changed. No pleural effusion. Hepatobiliary: Chronically absent gallbladder.  Negative liver. Pancreas: Chronic  pancreatic atrophy. Spleen: Small benign appearing hypodense area in the spleen on series 2, image 16 is more apparent than 2019. Adrenals/Urinary Tract: Negative adrenal glands. Symmetric renal enhancement and contrast excretion. Normal proximal ureters. No hydronephrosis or nephrolithiasis. More distended bladder today, but the bladder is otherwise unremarkable. Stomach/Bowel:  Redundant, decompressed large bowel in the pelvis. Redundant and increasingly gas distended transverse colon also with increased moderate to severe volume of retained stool which continues to the right colon. No large bowel inflammation identified. Terminal ileum is decompressed. Appendix not identified. No dilated small bowel. Decompressed stomach. Duodenum within normal limits. No free air, free fluid. Vascular/Lymphatic: Aortoiliac calcified atherosclerosis. Chronic surgical clips or embolization coils along the abdominal aorta and IVC are unchanged. Major arterial structures in the abdomen and pelvis remain patent. Portal venous system is patent. No lymphadenopathy. Reproductive: Surgically absent. Other: No pelvic free fluid. Musculoskeletal: Osteopenia. Mild T12 compression fracture is new since 2019 and un healed with visible comminuted fracture lucency (sagittal image 67). No retropulsion or complicating features. Elsewhere the visible spine is stable since 2019, including chronic lower sacral fractures. No other acute osseous abnormality identified. IMPRESSION: 1. Acute to subacute T12 compression fracture with unhealed comminution. No retropulsion of bone or complicating features. Underlying osteopenia. 2. Gas and stool distended right and transverse colon, with redundant but decompressed distal large bowel. No mechanical bowel obstruction suspected. No inflamed bowel. Favor constipation. 3. Cardiomegaly has progressed since 2019. Extensive Aortic Atherosclerosis (ICD10-I70.0). Chronic pancreatic atrophy. 4. No other acute or  significant abnormality identified in the abdomen or pelvis. Electronically Signed   By: Genevie Ann M.D.   On: 04/09/2020 04:59    Pending Labs Unresulted Labs (From admission, onward)          Start     Ordered   04/10/20 0500  CBC  Daily,   STAT      04/09/20 0754   04/10/20 9528  Basic metabolic panel  Daily,   STAT      04/09/20 0754   04/09/20 1730  VITAMIN D 25 Hydroxy (Vit-D Deficiency, Fractures)  Once,   R        04/09/20 1730   04/09/20 0244  Urine Culture  Add-on,   AD       Question:  Patient immune status  Answer:  Normal   04/09/20 0245          Vitals/Pain Today's Vitals   04/09/20 0800 04/09/20 0830 04/09/20 1130 04/09/20 1300  BP: 127/65 (!) 110/95 (!) 126/57 120/62  Pulse: 83 93 81 88  Resp: 15 19 20 18   Temp:    98.2 F (36.8 C)  TempSrc:    Oral  SpO2: 95% 98% 94% 95%  Weight:      Height:      PainSc:    0-No pain    Isolation Precautions Enteric precautions (UV disinfection)  Medications Medications  morphine 2 MG/ML injection 2 mg (has no administration in time range)  0.9 %  sodium chloride infusion ( Intravenous New Bag/Given 04/09/20 0607)  bisoprolol (ZEBETA) tablet 5 mg (5 mg Oral Given 04/09/20 1020)  furosemide (LASIX) tablet 20 mg (20 mg Oral Given 04/09/20 1731)  losartan (COZAAR) tablet 12.5 mg (12.5 mg Oral Given 04/09/20 1020)  levothyroxine (SYNTHROID) tablet 50 mcg (has no administration in time range)  psyllium (HYDROCIL/METAMUCIL) 1 packet (1 packet Oral Given 04/09/20 1020)  rivaroxaban (XARELTO) tablet 20 mg (20 mg Oral Given 04/09/20 1749)  ascorbic acid (VITAMIN C) tablet 250 mg (250 mg Oral Given 04/09/20 1019)  multivitamin with minerals tablet 1 tablet (1 tablet Oral Given 04/09/20 1034)  acetaminophen (TYLENOL) tablet 325 mg (325 mg Oral Given 04/09/20 1827)    Or  acetaminophen (TYLENOL) suppository 325 mg ( Rectal See Alternative 04/09/20 1827)  ondansetron (  ZOFRAN) tablet 4 mg (has no administration in time range)     Or  ondansetron (ZOFRAN) injection 4 mg (has no administration in time range)  levofloxacin (LEVAQUIN) IVPB 750 mg (has no administration in time range)  docusate sodium (COLACE) capsule 200 mg (has no administration in time range)  bisacodyl (DULCOLAX) EC tablet 10 mg (has no administration in time range)  sodium chloride 0.9 % bolus 500 mL (0 mLs Intravenous Stopped 04/09/20 0400)  iohexol (OMNIPAQUE) 300 MG/ML solution 75 mL (75 mLs Intravenous Contrast Given 04/09/20 0411)  ketorolac (TORADOL) 30 MG/ML injection 15 mg (15 mg Intravenous Given 04/09/20 0602)  magnesium citrate solution 1 Bottle (1 Bottle Oral Given 04/09/20 0828)  levofloxacin (LEVAQUIN) IVPB 750 mg (0 mg Intravenous Stopped 04/09/20 1026)    Mobility walks High fall risk   Focused Assessments Cardiac Assessment Handoff:    Lab Results  Component Value Date   TROPONINI <0.03 03/18/2017   No results found for: DDIMER Does the Patient currently have chest pain? No     R Recommendations: See Admitting Provider Note  Report given to:   Additional Notes:  T12 compression fracture found on imaging, ortho will consult.

## 2020-04-09 NOTE — ED Notes (Signed)
Called and spoke with Marzetta Board to ensure they are ready for patient to come up. Purple man already beside patient from earlier today when bed assignment was rescinded. Marzetta Board states its ok to send patient up.

## 2020-04-09 NOTE — Progress Notes (Addendum)
PROGRESS NOTE    Jodi Reeves  RDE:[id number] DOB: 1927/09/28 DOA: 04/09/2020 PCP: [name], Nino Glow, MD   Assessment & Plan:   Principal Problem:   Abdominal pain Active Problems:   Hypothyroidism   Essential hypertension   Atrial fibrillation (HCC)   Gait instability   Diabetes, polyneuropathy (HCC)   Chronic diastolic CHF (congestive heart failure) (HCC)   Coronary artery disease, non-occlusive   Type 2 diabetes mellitus with diabetic polyneuropathy, without long-term current use of insulin (HCC)   PAD (peripheral artery disease) (HCC)    Abdominal pain: likely secondary to constipation as per CT scan. No obstruction. Colace, dulcolax ordered  Generalized weakness: PT/OT consulted  T12 compression fracture: acute vs subacute as per CT. Likely secondary to multiple falls. Ortho surg consulted  Possible peripheral neuropathy: not on any gabapentin or lyrica at home as per med rec  Leukocytosis: reactive vs infection. Will continue to monitor   Hypothyroidism: continue on home dose of levothyroxine   HTN: continue on home dose of bisoprolol, losartan   UTI: urine cx is pending. Continue on IV levaquin   Persistent atrial fibrillation: continue on xarleto, bisoprolol    DVT prophylaxis: xarelto Code Status: full Family Communication:  Disposition Plan: depends on PT/OT recs   Status is: Observation  The patient remains OBS appropriate and will d/c before 2 midnights.  Dispo: The patient is from: Home               Anticipated d/c is to: SNF              Anticipated d/c date is: 2 days              Patient currently is not medically stable to d/c.    Consultants:      Procedures:   Antimicrobials: levaquin   Subjective: Pt c/o fatigue   Objective: Vitals:   04/09/20 0430 04/09/20 0500 04/09/20 0530 04/09/20 0600  BP: (!) 144/77 (!) 144/90 109/73 131/81  Pulse: (!) 103 96 100 99  Resp: 18 16 13 12   Temp:      TempSrc:       SpO2: 96% 93% 92% 98%  Weight:      Height:       No intake or output data in the 24 hours ending 04/09/20 0755 Filed Weights   04/08/20 2008  Weight: 78.5 kg    Examination:  General exam: Appears calm and comfortable  Respiratory system: Clear to auscultation. Respiratory effort normal. Cardiovascular system: S1 & S2+. No rubs, gallops or clicks.  Gastrointestinal system: Abd is firm, distended, tender to palpation & hypoactive bowel sounds Central nervous system: Alert and oriented. Moves all 4 extremities Psychiatry: Judgement and insight appear normal. Flat mood and affect    Data Reviewed: I have personally reviewed following labs and imaging studies  CBC: Recent Labs  Lab 04/08/20 2013  WBC 12.1*  HGB 12.2  HCT 36.2  MCV 99.2  PLT 631   Basic Metabolic Panel: Recent Labs  Lab 04/08/20 2013  NA 136  K 4.0  CL 97*  CO2 26  GLUCOSE 204*  BUN 30*  CREATININE 0.94  CALCIUM 9.5   GFR: Estimated Creatinine Clearance: 39.4 mL/min (by C-G formula based on SCr of 0.94 mg/dL). Liver Function Tests: Recent Labs  Lab 04/08/20 2013  AST 33  ALT 11  ALKPHOS 69  BILITOT 1.1  PROT 8.0  ALBUMIN 3.7   Recent Labs  Lab 04/08/20 2013  LIPASE 21  No results for input(s): AMMONIA in the last 168 hours. Coagulation Profile: No results for input(s): INR, PROTIME in the last 168 hours. Cardiac Enzymes: No results for input(s): CKTOTAL, CKMB, CKMBINDEX, TROPONINI in the last 168 hours. BNP (last 3 results) No results for input(s): PROBNP in the last 8760 hours. HbA1C: No results for input(s): HGBA1C in the last 72 hours. CBG: No results for input(s): GLUCAP in the last 168 hours. Lipid Profile: No results for input(s): CHOL, HDL, LDLCALC, TRIG, CHOLHDL, LDLDIRECT in the last 72 hours. Thyroid Function Tests: No results for input(s): TSH, T4TOTAL, FREET4, T3FREE, THYROIDAB in the last 72 hours. Anemia Panel: No results for input(s): VITAMINB12, FOLATE,  FERRITIN, TIBC, IRON, RETICCTPCT in the last 72 hours. Sepsis Labs: Recent Labs  Lab 04/09/20 0250  LATICACIDVEN 1.7    Recent Results (from the past 240 hour(s))  Resp Panel by RT-PCR (Flu A&B, Covid) Nasopharyngeal Swab     Status: None   Collection Time: 04/09/20  3:01 AM   Specimen: Nasopharyngeal Swab; Nasopharyngeal(NP) swabs in vial transport medium  Result Value Ref Range Status   SARS Coronavirus 2 by RT PCR NEGATIVE NEGATIVE Final    Comment: (NOTE) SARS-CoV-2 target nucleic acids are NOT DETECTED.  The SARS-CoV-2 RNA is generally detectable in upper respiratory specimens during the acute phase of infection. The lowest concentration of SARS-CoV-2 viral copies this assay can detect is 138 copies/mL. A negative result does not preclude SARS-Cov-2 infection and should not be used as the sole basis for treatment or other patient management decisions. A negative result may occur with  improper specimen collection/handling, submission of specimen other than nasopharyngeal swab, presence of viral mutation(s) within the areas targeted by this assay, and inadequate number of viral copies(<138 copies/mL). A negative result must be combined with clinical observations, patient history, and epidemiological information. The expected result is Negative.  Fact Sheet for Patients:  EntrepreneurPulse.com.au  Fact Sheet for Healthcare Providers:  IncredibleEmployment.be  This test is no t yet approved or cleared by the [place] FDA and  has been authorized for detection and/or diagnosis of SARS-CoV-2 by FDA under an Emergency Use Authorization (EUA). This EUA will remain  in effect (meaning this test can be used) for the duration of the COVID-19 declaration under Section 564(b)(1) of the Act, 21 U.S.C.section 360bbb-3(b)(1), unless the authorization is terminated  or revoked sooner.       Influenza A by PCR NEGATIVE NEGATIVE Final    Influenza B by PCR NEGATIVE NEGATIVE Final    Comment: (NOTE) The Xpert Xpress SARS-CoV-2/FLU/RSV plus assay is intended as an aid in the diagnosis of influenza from Nasopharyngeal swab specimens and should not be used as a sole basis for treatment. Nasal washings and aspirates are unacceptable for Xpert Xpress SARS-CoV-2/FLU/RSV testing.  Fact Sheet for Patients: EntrepreneurPulse.com.au  Fact Sheet for Healthcare Providers: IncredibleEmployment.be  This test is not yet approved or cleared by the [place] FDA and has been authorized for detection and/or diagnosis of SARS-CoV-2 by FDA under an Emergency Use Authorization (EUA). This EUA will remain in effect (meaning this test can be used) for the duration of the COVID-19 declaration under Section 564(b)(1) of the Act, 21 U.S.C. section 360bbb-3(b)(1), unless the authorization is terminated or revoked.  Performed at [facility], [address], [place], [place] [address]          Radiology Studies: CT ABDOMEN PELVIS W CONTRAST  Result Date: 04/09/2020 CLINICAL DATA:  86 year old female with lower abdominal, flank  and back pain. EXAM: CT ABDOMEN AND PELVIS WITH CONTRAST TECHNIQUE: Multidetector CT imaging of the abdomen and pelvis was performed using the standard protocol following bolus administration of intravenous contrast. CONTRAST:  33mL OMNIPAQUE IOHEXOL 300 MG/ML  SOLN COMPARISON:  CT Abdomen and Pelvis 01/21/2018. FINDINGS: Lower chest: Cardiomegaly has progressed since 2019. No pericardial effusion. Chronic lung base atelectasis or scarring not significantly changed. No pleural effusion. Hepatobiliary: Chronically absent gallbladder.  Negative liver. Pancreas: Chronic pancreatic atrophy. Spleen: Small benign appearing hypodense area in the spleen on series 2, image 16 is more apparent than 2019. Adrenals/Urinary Tract: Negative adrenal glands. Symmetric renal enhancement  and contrast excretion. Normal proximal ureters. No hydronephrosis or nephrolithiasis. More distended bladder today, but the bladder is otherwise unremarkable. Stomach/Bowel: Redundant, decompressed large bowel in the pelvis. Redundant and increasingly gas distended transverse colon also with increased moderate to severe volume of retained stool which continues to the right colon. No large bowel inflammation identified. Terminal ileum is decompressed. Appendix not identified. No dilated small bowel. Decompressed stomach. Duodenum within normal limits. No free air, free fluid. Vascular/Lymphatic: Aortoiliac calcified atherosclerosis. Chronic surgical clips or embolization coils along the abdominal aorta and IVC are unchanged. Major arterial structures in the abdomen and pelvis remain patent. Portal venous system is patent. No lymphadenopathy. Reproductive: Surgically absent. Other: No pelvic free fluid. Musculoskeletal: Osteopenia. Mild T12 compression fracture is new since 2019 and un healed with visible comminuted fracture lucency (sagittal image 67). No retropulsion or complicating features. Elsewhere the visible spine is stable since 2019, including chronic lower sacral fractures. No other acute osseous abnormality identified. IMPRESSION: 1. Acute to subacute T12 compression fracture with unhealed comminution. No retropulsion of bone or complicating features. Underlying osteopenia. 2. Gas and stool distended right and transverse colon, with redundant but decompressed distal large bowel. No mechanical bowel obstruction suspected. No inflamed bowel. Favor constipation. 3. Cardiomegaly has progressed since 2019. Extensive Aortic Atherosclerosis (ICD10-I70.0). Chronic pancreatic atrophy. 4. No other acute or significant abnormality identified in the abdomen or pelvis. Electronically Signed   By: Genevie Ann M.D.   On: 04/09/2020 04:59        Scheduled Meds: . bisoprolol  5 mg Oral Daily  . furosemide  20 mg Oral  BID  . [START ON 04/10/2020] levothyroxine  50 mcg Oral QAC breakfast  . losartan  12.5 mg Oral Daily  . magnesium citrate  1 Bottle Oral Once  . Multivitamin Gummies Adult   Oral Daily  . psyllium  1 packet Oral Daily  . rivaroxaban  20 mg Oral Q supper  . Vitamin C  200 mg Oral Daily   Continuous Infusions: . sodium chloride 125 mL/hr at 04/09/20 0607  . levofloxacin (LEVAQUIN) IV       LOS: 0 days    Time spent: 35 mins    Wyvonnia Dusky, MD [facility] Pager [phone]  If 7PM-7AM, please contact night-coverage 04/09/2020, 7:55 AM

## 2020-04-09 NOTE — ED Notes (Signed)
Pt transported to CT ?

## 2020-04-09 NOTE — Progress Notes (Signed)
PT Cancellation Note  Patient Details Name: Jodi Reeves MRN: [id number] DOB: 06-05-1927   Cancelled Treatment:    Reason Eval/Treat Not Completed: Medical issues which prohibited therapy: Per conversation with Dr. Jimmye Norman hold PT/OT services pending outcome of ortho consult for acute/subacute T12 compression fracture.  Will attempt to see pt at a future date/time as medically appropriate.     Linus Salmons PT, DPT 04/09/20, 3:06 PM

## 2020-04-09 NOTE — ED Provider Notes (Signed)
[facility] Emergency Department Provider Note  ____________________________________________   Event Date/Time   First MD Initiated Contact with Patient 04/09/20 0220     (approximate)  I have reviewed the triage vital signs and the nursing notes.   HISTORY  Chief Complaint Abdominal Pain and Flank Pain    HPI Jodi Reeves is a 86 y.o. female with extensive medical history as listed below who presents for evaluation of about 10 days of viral respiratory symptoms including sore throat, congestion, and some shortness of breath with exertion and mild cough. She also has had about 2 days of intermittent sharp and severe lower abdominal pain that is worse on the left, worse with ambulation, and nothing in particular makes it better or worse. She is seen multiple providers recently in the inpatient and ED settings, and was diagnosed with a urinary tract infection last week for which she was treated with Levaquin. She believes that the Levaquin has caused her to be very nauseated and unable to eat or drink very much but she has not vomited. She has a history of chronic diarrhea which has been worse recently as well. She feels generally weak and has had a substantial decrease in her stamina as well as her ability to ambulate. Attempts at ambulation causes her pain to be worse in her lower abdomen and her lower back. The pain seems to radiate from her abdomen to her lower back. She did not have any dysuria in spite of the positive urinalysis so she cannot say if that situation is feeling better.  She has generalized weakness and malaise and fatigue. She denies fever/chills, chest pain, and vomiting.         Past Medical History:  Diagnosis Date  . Allergy   . Anxiety   . Balance disorder   . Chronic diastolic CHF (congestive heart failure) ([place])   . Colon polyps   . Coronary artery disease, non-occlusive    a. LHC 11/2003: 10% LAD stenosis, 20% LCx stenosis, and  40% RCA stenosis; b. nuc stress test 12/15: small region of mild ischemia in the apical region with WMA also noted in the apical region, EF 60%. She declined invasive evaluation at that time  . DM2 (diabetes mellitus, type 2) ([place])   . Fall    04/2017 see careeverywhere [facility] multiple fractures, brusing   . Falls frequently    h/o left shoudler/wrist fracture and left fingers numb  . GERD (gastroesophageal reflux disease)    esophageal web  . 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 History of chicken pox   . History of eating disorder   . HLD (hyperlipidemia)   . HTN (hypertension)   . Hypothyroidism   . IBS (irritable bowel syndrome)    diarrhea   . Incontinence of bowel   . Osteoporosis   . Ovarian cancer ([place])    1980  . Persistent atrial fibrillation ([place])    a. CHADS2VASc = > 8 (CHF, HTN, age x 2, DM, TIA x 2, female); b. on Xarelto  . SBO (small bowel obstruction) ([place])    1998  . TIA (transient ischemic attack)   . UTI (urinary tract infection)   . Venous insufficiency     Patient Active Problem List   Diagnosis Date Noted  . Abdominal pain 04/09/2020  . Hypertension associated with diabetes ([place]) 03/15/2020  . Obesity (BMI 30-39.9) 03/15/2020  . Coronary artery disease involving native coronary artery of native heart  without angina pectoris 03/15/2020  . PAD (peripheral artery disease) ([place]) 08/14/2019  . Foot ulcer ([place]) 06/19/2019  . Lymphedema 06/09/2019  . Chronic venous insufficiency 06/09/2019  . Diabetic retinopathy associated with type 2 diabetes mellitus ([place]) 01/20/2019  . Low back pain 09/24/2018  . Overactive bladder 04/10/2018  . History of UTI 04/10/2018  . Hematuria 04/10/2018  . Diarrhea 04/10/2018  . Venous ulcer of leg ([place]) 04/10/2018  . Status post endoscopic carpal tunnel release 03/19/2018  . History of skin cancer 11/08/2017  . Chronic anticoagulation 06/11/2017  . Aortic atherosclerosis ([place]) 05/08/2017  . Coronary artery  disease, non-occlusive 03/29/2017  . Persistent atrial fibrillation ([place]) 03/29/2017  . Carpal tunnel syndrome 03/28/2017  . Cervical radiculitis 03/28/2017  . Problems with swallowing and mastication   . Esophageal candidiasis ([place])   . Stricture and stenosis of esophagus 05/02/2016  . TIA (transient ischemic attack) 06/24/2015  . 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 Humeral head fracture 10/06/2014  . Chest pain 03/27/2014  . Shortness of breath 03/27/2014  . Chronic diastolic CHF (congestive heart failure) ([place]) 03/27/2014  . Advanced directives, counseling/discussion 02/24/2014  . Routine general medical examination at a health care facility 02/17/2013  . Diabetes, polyneuropathy ([place]) 02/17/2013  . Type 2 diabetes mellitus with diabetic polyneuropathy, without long-term current use of insulin ([place]) 02/17/2013  . Gait instability 04/11/2012  . Edema 11/14/2010  . OTHER CONSTIPATION 05/31/2007  . VENTRAL HERNIA 03/15/2007  . Ovarian cancer ([place]) 11/20/2006  . Hypothyroidism 11/20/2006  . Type 2 diabetes mellitus with neurological manifestations, controlled ([place]) 11/20/2006  . Essential hypertension 11/20/2006  . Atrial fibrillation ([place]) 11/20/2006  . Osteoporosis 11/20/2006  . Hyperlipidemia due to type 2 diabetes mellitus ([place]) 11/20/2006    Past Surgical History:  Procedure Laterality Date  . 2nd look laparotomy  1982  . ABDOMINAL HYSTERECTOMY     for ovarian cancer s/p hysterectomy total 1976 and exp lap 1980  . APPENDECTOMY    . CARDIAC CATHETERIZATION  8/05   neg; a fib found   . CARPAL TUNNEL RELEASE Left 03/06/2018   Procedure: CARPAL TUNNEL RELEASE ENDOSCOPIC;  Surgeon: Corky Mull, MD;  Location: [facility];  Service: Orthopedics;  Laterality: Left;  diabetic - oral meds  . CARPAL TUNNEL RELEASE     left CTS Dr.Poggi  . cataract OD  2002  . cataract surgery  5/07   R  . CHOLECYSTECTOMY  1986  . DEXA  9/04 and 1/02  .  EGD/dilation/colon  1/06  . ESOPHAGOGASTRODUODENOSCOPY (EGD) WITH PROPOFOL N/A 05/16/2016   Procedure: ESOPHAGOGASTRODUODENOSCOPY (EGD) WITH PROPOFOL with dilation;  Surgeon: Lucilla Lame, MD;  Location: [facility] ENDOSCOPY;  Service: Endoscopy;  Laterality: N/A;  . FEMORAL HERNIA REPAIR  1958   R  . fracture L elbow/wrist  2000  . intussception/obstruction  12/98  . JOINT REPLACEMENT     left shoulder  . kidney stone x2  1991  . laminectomy L4-5  1971  . LUMBAR LAMINECTOMY     1970 ruptured disc   . MOUTH SURGERY    . myoview stress  8/05   syncope (-)  . REVERSE SHOULDER ARTHROPLASTY Left 10/06/2014   Procedure: REVERSE SHOULDER ARTHROPLASTY;  Surgeon: Corky Mull, MD;  Location: [facility] ORS;  Service: Orthopedics;  Laterality: Left;  . stillbirth  1969  . TOTAL ABDOMINAL HYSTERECTOMY W/ BILATERAL SALPINGOOPHORECTOMY  1980   ovarian cancer  . WRIST FRACTURE SURGERY  11/05   R  Prior to Admission medications   Medication Sig Start Date End Date Taking? Authorizing Provider  acetaminophen (TYLENOL) 500 MG tablet Take 1,000 mg by mouth every 6 (six) hours as needed for moderate pain or headache.    [provider]  Ascorbic Acid (VITAMIN C) POWD Take 200 mg by mouth daily.     [provider]  bisoprolol (ZEBETA) 5 MG tablet Take 1 tablet (5 mg total) by mouth daily. 08/25/19   Minna Merritts, MD  co-enzyme Q-10 30 MG capsule Take 30 mg by mouth daily.     [provider]  estradiol (ESTRACE) 0.1 MG/GM vaginal cream APPLY 0.5MG  (PEA SIZED AMOUNT) JUST INSIDE THE VAGINA WITH FINGER-TIP ON MONDAY, WEDNESDAY AND FRIDAY NIGHTS. 12/05/19   Ernestine Conrad, Larene Beach A, PA-C  furosemide (LASIX) 20 MG tablet Take 1 tablet (20 mg total) by mouth 2 (two) times daily. 05/28/19   Minna Merritts, MD  glucose blood (ONE TOUCH ULTRA TEST) test strip Use to test blood sugar once daily E11.49 Patient taking differently: Use to test blood sugar twice daily E11.49 06/24/15   Bedsole, Amy E, MD   levothyroxine (SYNTHROID, LEVOTHROID) 50 MCG tablet Take 50 mcg by mouth daily before breakfast.    [provider]  losartan (COZAAR) 25 MG tablet Take 0.5 tablets (12.5 mg total) by mouth daily. Patient taking differently: Take 12.5 mg by mouth daily. Take if BP >110 sbp 01/12/20 04/11/20  Minna Merritts, MD  magnesium oxide (MAG-OX) 400 MG tablet Take 400 mg by mouth daily.    [provider]  metFORMIN (GLUCOPHAGE-XR) 500 MG 24 hr tablet Take 500 mg by mouth 2 (two) times daily.  06/17/19   [provider]  Multiple Vitamin (CALCIUM COMPLEX PO) Take 1 tablet by mouth daily.    [provider]  Multiple Vitamins-Minerals (MULTIVITAMIN GUMMIES ADULT PO) Take 1 each by mouth daily.    [provider]  NON FORMULARY [place] Supplement B12 Takes qd. B6 qd, and folic acid    [provider]  Olopatadine HCl 0.2 % SOLN 1 drop once daily    [provider]  Omega-3 1000 MG CAPS Take 2,000 mg by mouth daily.     [provider]  potassium chloride (KLOR-CON) 10 MEQ tablet Take 1 tablet (10 mEq total) by mouth 2 (two) times daily. 05/28/19   Minna Merritts, MD  prednisoLONE acetate (PRED FORTE) 1 % ophthalmic suspension  01/12/20   [provider]  Probiotic Product (ALIGN PO) Take 1 capsule by mouth daily.    [provider]  psyllium (METAMUCIL) 58.6 % packet Take 1 packet by mouth daily.     [provider]  SANTYL ointment Apply 1 application topically daily. 08/19/19   Felipa Furnace, DPM  XARELTO 20 MG TABS tablet Take 1 tablet (20 mg total) by mouth daily with supper. 10/01/19   Minna Merritts, MD    Allergies Amiodarone, Penicillins, Actos [pioglitazone], Amaryl [glimepiride], Atorvastatin, Bentyl [dicyclomine hcl], Ciprofloxacin, Codeine, Ezetimibe-simvastatin, Fosamax [alendronate], Lovastatin, Macrobid [nitrofurantoin macrocrystal], Protonix [pantoprazole sodium], Rosiglitazone maleate,  Statins, Victoza [liraglutide], Alendronate sodium, Bactrim [sulfamethoxazole-trimethoprim], and Sulfa antibiotics  Family History  Problem Relation Age of Onset  . Early death Father   . Diabetes Mother   . Heart disease Mother   . Arthritis Brother   . Cancer Brother   . Depression Brother   . Hearing loss Brother   . Arthritis Brother   . Cancer Brother  colon  . Hearing loss Brother   . Cancer Brother   . Diabetes Brother   . Hearing loss Brother   . Heart disease Brother   . Hyperlipidemia Brother   . Heart disease Sister   . Hypertension Sister   . Stroke Sister   . Hearing loss Daughter   . Hypertension Daughter   . Diabetes Paternal Grandfather   . Hearing loss Sister   . Heart disease Sister   . Arthritis Sister   . Depression Sister   . Hearing loss Sister   . Heart disease Sister   . COPD Brother   . Early death Brother   . Early death Brother     Social History Social History   Tobacco Use  . Smoking status: Never Smoker  . Smokeless tobacco: Never Used  Vaping Use  . Vaping Use: Never used  Substance Use Topics  . Alcohol use: No    Comment: may have occasional glass of wine  . Drug use: No    Review of Systems Constitutional: No fever/chills.  General malaise and fatigue. Eyes: No visual changes. ENT: No sore throat. Cardiovascular: Denies chest pain. Respiratory: Denies shortness of breath. Gastrointestinal: Several days of lower abdominal pain worse on the left. Nausea, no vomiting. Recent decreased oral intake. Genitourinary: Negative for dysuria. Recently treated for UTI with Levaquin. Musculoskeletal: Lower back pain that seems to radiate from the lower abdomen. Integumentary: Negative for rash. Neurological: Negative for headaches, focal weakness or numbness.   ____________________________________________   PHYSICAL EXAM:  VITAL SIGNS: ED Triage Vitals  Enc Vitals Group     BP 04/08/20 2012 130/78     Pulse Rate  04/08/20 2012 (!) 102     Resp 04/08/20 2012 16     Temp 04/08/20 2012 98.5 F (36.9 C)     Temp Source 04/08/20 2012 Oral     SpO2 04/08/20 2012 97 %     Weight 04/08/20 2008 78.5 kg (173 lb 1 oz)     Height 04/08/20 2008 1.6 m (5\' 3" )     Head Circumference --      Peak Flow --      Pain Score --      Pain Loc --      Pain Edu? --      Excl. in [place]? --     Constitutional: Alert and oriented.  Eyes: Conjunctivae are normal.  Head: Atraumatic. Nose: No congestion/rhinnorhea. Mouth/Throat: Patient is wearing a mask. Neck: No stridor.  No meningeal signs.   Cardiovascular: Normal rate, regular rhythm. Good peripheral circulation. Respiratory: Normal respiratory effort.  No retractions. Gastrointestinal: Soft. Tenderness to palpation of the lower abdomen that seems to be more exquisite on the left lower quadrant. Musculoskeletal: 2+ pitting lymphedema in bilateral lower extremities.  Chronic skin changes indicating that this is not an acute abnormality.  No evidence of infection. Neurologic:  Normal speech and language. No gross focal neurologic deficits are appreciated.  However the patient is very unsteady on her feet and required 2-3 person assistance to get her from her wheelchair into the bed.  She says that this is an acute change in that up until 2 or 3 days ago she was able to walk on her own. Skin:  Skin is warm, dry and intact. Psychiatric: Mood and affect are normal. Speech and behavior are normal.  ____________________________________________   LABS (all labs ordered are listed, but only abnormal results are displayed)  Labs Reviewed  COMPREHENSIVE METABOLIC  PANEL - Abnormal; Notable for the following components:      Result Value   Chloride 97 (*)    Glucose, Bld 204 (*)    BUN 30 (*)    GFR, Estimated 58 (*)    All other components within normal limits  CBC - Abnormal; Notable for the following components:   WBC 12.1 (*)    RBC 3.65 (*)    All other components  within normal limits  URINALYSIS, COMPLETE (UACMP) WITH MICROSCOPIC - Abnormal; Notable for the following components:   Color, Urine YELLOW (*)    APPearance CLEAR (*)    All other components within normal limits  RESP PANEL BY RT-PCR (FLU A&B, COVID) ARPGX2  URINE CULTURE  C DIFFICILE QUICK SCREEN W PCR REFLEX  GASTROINTESTINAL PANEL BY PCR, STOOL (REPLACES STOOL CULTURE)  LIPASE, BLOOD  LACTIC ACID, PLASMA   ____________________________________________  EKG  ED ECG REPORT I, Hinda Kehr, the attending physician, personally viewed and interpreted this ECG.  Date: 04/09/2020 EKG Time: 5:06 AM Rate: 99 Rhythm: Atrial fibrillation with occasional PVC QRS Axis: normal Intervals: normal ST/T Wave abnormalities: Non-specific ST segment / T-wave changes, but no clear evidence of acute ischemia. Narrative Interpretation: no definitive evidence of acute ischemia; does not meet STEMI criteria.   ____________________________________________  RADIOLOGY I, Hinda Kehr, personally viewed and evaluated these images (plain radiographs) as part of my medical decision making, as well as reviewing the written report by the radiologist.  ED MD interpretation: T12 compression fracture that appears acute.  Constipation.  No diverticulitis nor bowel obstruction.  Official radiology report(s): CT ABDOMEN PELVIS W CONTRAST  Result Date: 04/09/2020 CLINICAL DATA:  86 year old female with lower abdominal, flank and back pain. EXAM: CT ABDOMEN AND PELVIS WITH CONTRAST TECHNIQUE: Multidetector CT imaging of the abdomen and pelvis was performed using the standard protocol following bolus administration of intravenous contrast. CONTRAST:  61mL OMNIPAQUE IOHEXOL 300 MG/ML  SOLN COMPARISON:  CT Abdomen and Pelvis 01/21/2018. FINDINGS: Lower chest: Cardiomegaly has progressed since 2019. No pericardial effusion. Chronic lung base atelectasis or scarring not significantly changed. No pleural effusion.  Hepatobiliary: Chronically absent gallbladder.  Negative liver. Pancreas: Chronic pancreatic atrophy. Spleen: Small benign appearing hypodense area in the spleen on series 2, image 16 is more apparent than 2019. Adrenals/Urinary Tract: Negative adrenal glands. Symmetric renal enhancement and contrast excretion. Normal proximal ureters. No hydronephrosis or nephrolithiasis. More distended bladder today, but the bladder is otherwise unremarkable. Stomach/Bowel: Redundant, decompressed large bowel in the pelvis. Redundant and increasingly gas distended transverse colon also with increased moderate to severe volume of retained stool which continues to the right colon. No large bowel inflammation identified. Terminal ileum is decompressed. Appendix not identified. No dilated small bowel. Decompressed stomach. Duodenum within normal limits. No free air, free fluid. Vascular/Lymphatic: Aortoiliac calcified atherosclerosis. Chronic surgical clips or embolization coils along the abdominal aorta and IVC are unchanged. Major arterial structures in the abdomen and pelvis remain patent. Portal venous system is patent. No lymphadenopathy. Reproductive: Surgically absent. Other: No pelvic free fluid. Musculoskeletal: Osteopenia. Mild T12 compression fracture is new since 2019 and un healed with visible comminuted fracture lucency (sagittal image 67). No retropulsion or complicating features. Elsewhere the visible spine is stable since 2019, including chronic lower sacral fractures. No other acute osseous abnormality identified. IMPRESSION: 1. Acute to subacute T12 compression fracture with unhealed comminution. No retropulsion of bone or complicating features. Underlying osteopenia. 2. Gas and stool distended right and transverse colon, with redundant but decompressed  distal large bowel. No mechanical bowel obstruction suspected. No inflamed bowel. Favor constipation. 3. Cardiomegaly has progressed since 2019. Extensive Aortic  Atherosclerosis (ICD10-I70.0). Chronic pancreatic atrophy. 4. No other acute or significant abnormality identified in the abdomen or pelvis. Electronically Signed   By: Genevie Ann M.D.   On: 04/09/2020 04:59    ____________________________________________   PROCEDURES   Procedure(s) performed (including Critical Care):  Procedures   ____________________________________________   INITIAL IMPRESSION / MDM / [place] / ED COURSE  As part of my medical decision making, I reviewed the following data within the [place] History obtained from family, Nursing notes reviewed and incorporated, Labs reviewed , EKG interpreted , Old chart reviewed, Discussed with admitting physician (Dr. Tobie Poet) and Notes from prior ED visits   Differential diagnosis includes, but is not limited to, diverticulitis, UTI/pyonephritis, musculoskeletal pain or strain, mesenteric ischemia, failure to thrive NOS, metabolic or electrolyte abnormality.  The patient is sharp, alert and oriented and able to provide a good history, and sounds as though she is taken a significant downturn in her health over the last couple of weeks, but most acutely over the last 3 days when she has developed lower abdominal pain, nausea, and decreased appetite and oral intake.  I am most concerned about the possibility of acute diverticulitis given her symptoms and physical exam with tenderness to palpation of the left lower quadrant in particular.  Additionally she is very weak and requires heavy assistance for a even transferring from a wheelchair to the bed which is a new problem for her.  She has no symptoms that seem to be cardiac or respiratory in nature.  Comprehensive metabolic panel, urinalysis, and CBC are all generally unremarkable, although she does have a leukocytosis of 12.1 which is elevated compared to her labs that are in the system from her recent primary care visit when she had a white blood cell count  of 7.  Unclear if this is due to the well-documented UTI and the course of Levaquin she has been taking but her urinalysis is much improved at this time.  We will proceed with a small fluid bolus of 500 mL normal saline and a CT scan of the abdomen and pelvis with IV contrast to further evaluate for infectious etiology or other acute abnormality.  She understands and agrees with the plan.       Clinical Course as of 04/09/20 0545  Fri Apr 09, 2020  5277 Lactic Acid, Venous: 1.7 [CF]  0418 SARS Coronavirus 2 by RT PCR: NEGATIVE [CF]  0523 CT scan demonstrates acute T12 fracture which I suspect happened a few days ago and is the reason for the majority of the patient's pain as well as her inability to ambulate without heavy assistance.  There is no evidence of bowel obstruction or diverticulitis but she has extensive constipation that is likely also contributing to her issues including the nausea, decreased oral intake, and diarrhea.  I am concerned that she will not be able to tolerate with outpatient therapy as she has tried for the last week and has been unsuccessful.  She is currently requiring hospital level care.  I will give a dose of Toradol 15 mg IV for pain as well as 2 mg of IV morphine as needed for pain, but I am also concerned that repeated opioid dosing will be harmful for her given the constipation is one of the issues.  Regardless, given her plethora of acute symptoms including the  T12 fracture and intractable pain and inability to ambulate as well as the need for an aggressive bowel regimen in a controlled setting with IV fluid hydration, I will consult the hospitalist service for admission. I discussed the results and the plan with the patient and her son and they adamantly agree and expressed concern that they will not be able to provide adequate care for her at home given the heavy assistance she requires and the severe pain she experiences with movement and ambulation.  [CF]  0543  Discussed case by phone with Dr. Tobie Poet with the hospitalist service and she will admit.  As per her good suggestion, I ordered C. difficile and GI pathogen panel testing given the patient's abdominal pain, intermittent diarrhea in the setting of constipation on the CT scan, and unexplained leukocytosis particularly given her recent treatment with Levaquin. [CF]    Clinical Course User Index [CF] Hinda Kehr, MD     ____________________________________________  FINAL CLINICAL IMPRESSION(S) / ED DIAGNOSES  Final diagnoses:  Compression fracture of T12 vertebra, initial encounter ([place])  Intractable pain  Unable to walk  Constipation, unspecified constipation type  Lower abdominal pain  Nausea  Decreased oral intake  Failure to thrive in adult  Leukocytosis, unspecified type     MEDICATIONS GIVEN DURING THIS VISIT:  Medications  ketorolac (TORADOL) 30 MG/ML injection 15 mg (has no administration in time range)  morphine 2 MG/ML injection 2 mg (has no administration in time range)  sodium chloride 0.9 % bolus 500 mL (0 mLs Intravenous Stopped 04/09/20 0400)  iohexol (OMNIPAQUE) 300 MG/ML solution 75 mL (75 mLs Intravenous Contrast Given 04/09/20 0411)     ED Discharge Orders    None      *Please note:  Jodi Reeves was evaluated in Emergency Department on 04/09/2020 for the symptoms described in the history of present illness. She was evaluated in the context of the global COVID-19 pandemic, which necessitated consideration that the patient might be at risk for infection with the SARS-CoV-2 virus that causes COVID-19. Institutional protocols and algorithms that pertain to the evaluation of patients at risk for COVID-19 are in a state of rapid change based on information released by regulatory bodies including the CDC and federal and state organizations. These policies and algorithms were followed during the patient's care in the ED.  Some ED evaluations and interventions may be  delayed as a result of limited staffing during and after the pandemic.*  Note:  This document was prepared using Dragon voice recognition software and may include unintentional dictation errors.   Hinda Kehr, MD 04/09/20 [phone]

## 2020-04-09 NOTE — Progress Notes (Signed)
OT Cancellation Note  Patient Details Name: Jodi Reeves MRN: [id number] DOB: [birth year]   Cancelled Treatment:    Reason Eval/Treat Not Completed: Patient at procedure or test/ unavailable. Pt attempted x2 this AM, providers at bed side assessing pt. Of note, pt imaging noted to have T12 compression fx and no ortho consult/recommendations at this time. Will follow acutely and initiate services at later date/time as able.   Dessie Coma, M.S. OTR/L  04/09/20, 2:12 PM  ascom [phone]

## 2020-04-09 NOTE — ED Notes (Signed)
Pt has returned from CT scan in NAD. Son remains at bedside.

## 2020-04-10 ENCOUNTER — Inpatient Hospital Stay: Payer: Medicare HMO

## 2020-04-10 DIAGNOSIS — Z7901 Long term (current) use of anticoagulants: Secondary | ICD-10-CM | POA: Diagnosis not present

## 2020-04-10 DIAGNOSIS — K219 Gastro-esophageal reflux disease without esophagitis: Secondary | ICD-10-CM | POA: Diagnosis present

## 2020-04-10 DIAGNOSIS — R5381 Other malaise: Secondary | ICD-10-CM | POA: Diagnosis present

## 2020-04-10 DIAGNOSIS — R110 Nausea: Secondary | ICD-10-CM | POA: Diagnosis present

## 2020-04-10 DIAGNOSIS — N390 Urinary tract infection, site not specified: Secondary | ICD-10-CM | POA: Diagnosis present

## 2020-04-10 DIAGNOSIS — I2510 Atherosclerotic heart disease of native coronary artery without angina pectoris: Secondary | ICD-10-CM | POA: Diagnosis present

## 2020-04-10 DIAGNOSIS — S22080A Wedge compression fracture of T11-T12 vertebra, initial encounter for closed fracture: Principal | ICD-10-CM

## 2020-04-10 DIAGNOSIS — R1032 Left lower quadrant pain: Secondary | ICD-10-CM | POA: Diagnosis not present

## 2020-04-10 DIAGNOSIS — R2681 Unsteadiness on feet: Secondary | ICD-10-CM | POA: Diagnosis not present

## 2020-04-10 DIAGNOSIS — Z8543 Personal history of malignant neoplasm of ovary: Secondary | ICD-10-CM | POA: Diagnosis not present

## 2020-04-10 DIAGNOSIS — K5900 Constipation, unspecified: Secondary | ICD-10-CM | POA: Diagnosis present

## 2020-04-10 DIAGNOSIS — E1142 Type 2 diabetes mellitus with diabetic polyneuropathy: Secondary | ICD-10-CM | POA: Diagnosis present

## 2020-04-10 DIAGNOSIS — Z9842 Cataract extraction status, left eye: Secondary | ICD-10-CM | POA: Diagnosis not present

## 2020-04-10 DIAGNOSIS — Z20822 Contact with and (suspected) exposure to covid-19: Secondary | ICD-10-CM | POA: Diagnosis present

## 2020-04-10 DIAGNOSIS — I739 Peripheral vascular disease, unspecified: Secondary | ICD-10-CM | POA: Diagnosis not present

## 2020-04-10 DIAGNOSIS — R197 Diarrhea, unspecified: Secondary | ICD-10-CM | POA: Diagnosis present

## 2020-04-10 DIAGNOSIS — Z7984 Long term (current) use of oral hypoglycemic drugs: Secondary | ICD-10-CM | POA: Diagnosis not present

## 2020-04-10 DIAGNOSIS — I110 Hypertensive heart disease with heart failure: Secondary | ICD-10-CM | POA: Diagnosis present

## 2020-04-10 DIAGNOSIS — I10 Essential (primary) hypertension: Secondary | ICD-10-CM | POA: Diagnosis not present

## 2020-04-10 DIAGNOSIS — N3000 Acute cystitis without hematuria: Secondary | ICD-10-CM | POA: Diagnosis not present

## 2020-04-10 DIAGNOSIS — E039 Hypothyroidism, unspecified: Secondary | ICD-10-CM | POA: Diagnosis present

## 2020-04-10 DIAGNOSIS — Z90710 Acquired absence of both cervix and uterus: Secondary | ICD-10-CM | POA: Diagnosis not present

## 2020-04-10 DIAGNOSIS — R531 Weakness: Secondary | ICD-10-CM | POA: Diagnosis not present

## 2020-04-10 DIAGNOSIS — Z79890 Hormone replacement therapy (postmenopausal): Secondary | ICD-10-CM | POA: Diagnosis not present

## 2020-04-10 DIAGNOSIS — I4811 Longstanding persistent atrial fibrillation: Secondary | ICD-10-CM | POA: Diagnosis not present

## 2020-04-10 DIAGNOSIS — E1151 Type 2 diabetes mellitus with diabetic peripheral angiopathy without gangrene: Secondary | ICD-10-CM | POA: Diagnosis present

## 2020-04-10 DIAGNOSIS — Z8673 Personal history of transient ischemic attack (TIA), and cerebral infarction without residual deficits: Secondary | ICD-10-CM | POA: Diagnosis not present

## 2020-04-10 DIAGNOSIS — Z9049 Acquired absence of other specified parts of digestive tract: Secondary | ICD-10-CM | POA: Diagnosis not present

## 2020-04-10 DIAGNOSIS — I5032 Chronic diastolic (congestive) heart failure: Secondary | ICD-10-CM | POA: Diagnosis present

## 2020-04-10 DIAGNOSIS — E785 Hyperlipidemia, unspecified: Secondary | ICD-10-CM | POA: Diagnosis present

## 2020-04-10 DIAGNOSIS — Z79899 Other long term (current) drug therapy: Secondary | ICD-10-CM | POA: Diagnosis not present

## 2020-04-10 DIAGNOSIS — I4819 Other persistent atrial fibrillation: Secondary | ICD-10-CM | POA: Diagnosis present

## 2020-04-10 DIAGNOSIS — W19XXXA Unspecified fall, initial encounter: Secondary | ICD-10-CM | POA: Diagnosis present

## 2020-04-10 LAB — CBC
HCT: 35.6 % — ABNORMAL LOW (ref 36.0–46.0)
Hemoglobin: 12 g/dL (ref 12.0–15.0)
MCH: 33.7 pg (ref 26.0–34.0)
MCHC: 33.7 g/dL (ref 30.0–36.0)
MCV: 100 fL (ref 80.0–100.0)
Platelets: 168 10*3/uL (ref 150–400)
RBC: 3.56 MIL/uL — ABNORMAL LOW (ref 3.87–5.11)
RDW: 13 % (ref 11.5–15.5)
WBC: 8.4 10*3/uL (ref 4.0–10.5)
nRBC: 0 % (ref 0.0–0.2)

## 2020-04-10 LAB — BASIC METABOLIC PANEL
Anion gap: 10 (ref 5–15)
BUN: 18 mg/dL (ref 8–23)
CO2: 26 mmol/L (ref 22–32)
Calcium: 8.2 mg/dL — ABNORMAL LOW (ref 8.9–10.3)
Chloride: 102 mmol/L (ref 98–111)
Creatinine, Ser: 0.6 mg/dL (ref 0.44–1.00)
GFR, Estimated: >60 mL/min (ref >60)
Glucose, Bld: 194 mg/dL — ABNORMAL HIGH (ref 70–99)
Potassium: 3.8 mmol/L (ref 3.5–5.1)
Sodium: 138 mmol/L (ref 135–145)

## 2020-04-10 LAB — VITAMIN D 25 HYDROXY (VIT D DEFICIENCY, FRACTURES): Vit D, 25-Hydroxy: 32.88 ng/mL (ref 30–100)

## 2020-04-10 MED ORDER — RIVAROXABAN 15 MG PO TABS
15.0000 mg | ORAL_TABLET | Freq: Every day | ORAL | Status: DC
  Administered 2020-04-10: 15 mg via ORAL
  Filled 2020-04-10 (×2): qty 1

## 2020-04-10 MED ORDER — LEVOFLOXACIN IN D5W 750 MG/150ML IV SOLN: 750.0000 mg | INTRAVENOUS | Status: DC

## 2020-04-10 MED ORDER — ACETAMINOPHEN 325 MG PO TABS
650.0000 mg | ORAL_TABLET | Freq: Four times a day (QID) | ORAL | Status: DC | PRN
  Administered 2020-04-10 – 2020-04-15 (×6): 650 mg via ORAL
  Filled 2020-04-10 (×6): qty 2

## 2020-04-10 MED ORDER — DOCUSATE SODIUM 100 MG PO CAPS: 200.0000 mg | ORAL_CAPSULE | Freq: Two times a day (BID) | ORAL | Status: DC | PRN

## 2020-04-10 NOTE — Progress Notes (Signed)
PROGRESS NOTE    Jodi Reeves  ASN:[id number] DOB: 03/14/1928 DOA: 04/09/2020 PCP: [name], Nino Glow, MD   Assessment & Plan:   Principal Problem:   Abdominal pain Active Problems:   Hypothyroidism   Essential hypertension   Atrial fibrillation (HCC)   Gait instability   Diabetes, polyneuropathy (HCC)   Chronic diastolic CHF (congestive heart failure) (HCC)   Coronary artery disease, non-occlusive   Type 2 diabetes mellitus with diabetic polyneuropathy, without long-term current use of insulin (HCC)   PAD (peripheral artery disease) (HCC)    Abdominal pain: likely secondary to constipation as per CT scan. No obstruction. Bowel movements overnight  Generalized weakness: PT/OT consulted  T12 compression fracture: acute vs subacute as per CT. Likely secondary to multiple falls. Ortho surg consulted  Possible peripheral neuropathy: not on any gabapentin or lyrica at home as per med rec  Leukocytosis: resolved  Hypothyroidism: continue on home dose of levothyroxine   HTN: continue on home dose of losartan, bisoprolol.  UTI: urine cx is pending. Continue on IV levaquin   Persistent atrial fibrillation: continue on bisoprolol, xarelto     DVT prophylaxis: xarelto Code Status: full Family Communication: discussed pt's care w/ pt's son, Tim, and answered his questions  Disposition Plan: depends on PT/OT recs   Status is: Inpatient  Remains inpatient appropriate because:Ongoing diagnostic testing needed not appropriate for outpatient work up, Unsafe d/c plan and IV treatments appropriate due to intensity of illness or inability to take PO   Dispo: The patient is from: Home              Anticipated d/c is to: SNF              Anticipated d/c date is: 3 days              Patient currently is not medically stable to d/c.          Consultants:      Procedures:   Antimicrobials: levaquin   Subjective: Pt c/o malaise & intermittently  having difficulty remembering things   Objective: Vitals:   04/09/20 1943 04/09/20 2048 04/09/20 2331 04/10/20 0605  BP: 124/85 (!) 149/75 (!) 144/78 (!) 153/79  Pulse: 93 94 94 94  Resp: 18 20 16 20   Temp: 97.9 F (36.6 C) 98.2 F (36.8 C) 97.9 F (36.6 C) 98.4 F (36.9 C)  TempSrc: Oral Oral  Oral  SpO2: 94% 96% 93% 91%  Weight:      Height:        Intake/Output Summary (Last 24 hours) at 04/10/2020 0727 Last data filed at 04/10/2020 0610 Gross per 24 hour  Intake 150 ml  Output 200 ml  Net -50 ml   Filed Weights   04/08/20 2008  Weight: 78.5 kg    Examination:  General exam: Appears anxious  Respiratory system: clear breath sounds b/l. No wheezes, rales Cardiovascular system: S1/S2+. No rubs or clicks  Gastrointestinal system: Abd is firm, distended, tender to palpation & hypoactive bowel sounds Central nervous system: Alert and awake. Moves all 4 extremities  Psychiatry: Judgement and insight appear abnormal. Flat mood and affect     Data Reviewed: I have personally reviewed following labs and imaging studies  CBC: Recent Labs  Lab 04/08/20 2013 04/09/20 0837 04/10/20 0559  WBC 12.1* 8.5 8.4  HGB 12.2 11.3* 12.0  HCT 36.2 33.9* 35.6*  MCV 99.2 100.0 100.0  PLT 200 162 193   Basic Metabolic Panel: Recent Labs  Lab 04/08/20  2013 04/09/20 0837 04/10/20 0559  NA 136 135 138  K 4.0 3.7 3.8  CL 97* 99 102  CO2 26 24 26   GLUCOSE 204* 177* 194*  BUN 30* 27* 18  CREATININE 0.94 0.74 0.60  CALCIUM 9.5 8.6* 8.2*   GFR: Estimated Creatinine Clearance: 46.3 mL/min (by C-G formula based on SCr of 0.6 mg/dL). Liver Function Tests: Recent Labs  Lab 04/08/20 2013  AST 33  ALT 11  ALKPHOS 69  BILITOT 1.1  PROT 8.0  ALBUMIN 3.7   Recent Labs  Lab 04/08/20 2013  LIPASE 21   No results for input(s): AMMONIA in the last 168 hours. Coagulation Profile: No results for input(s): INR, PROTIME in the last 168 hours. Cardiac Enzymes: No results for  input(s): CKTOTAL, CKMB, CKMBINDEX, TROPONINI in the last 168 hours. BNP (last 3 results) No results for input(s): PROBNP in the last 8760 hours. HbA1C: No results for input(s): HGBA1C in the last 72 hours. CBG: No results for input(s): GLUCAP in the last 168 hours. Lipid Profile: No results for input(s): CHOL, HDL, LDLCALC, TRIG, CHOLHDL, LDLDIRECT in the last 72 hours. Thyroid Function Tests: No results for input(s): TSH, T4TOTAL, FREET4, T3FREE, THYROIDAB in the last 72 hours. Anemia Panel: Recent Labs    04/09/20 0837  VITAMINB12 1,396*   Sepsis Labs: Recent Labs  Lab 04/09/20 0250  LATICACIDVEN 1.7    Recent Results (from the past 240 hour(s))  Resp Panel by RT-PCR (Flu A&B, Covid) Nasopharyngeal Swab     Status: None   Collection Time: 04/09/20  3:01 AM   Specimen: Nasopharyngeal Swab; Nasopharyngeal(NP) swabs in vial transport medium  Result Value Ref Range Status   SARS Coronavirus 2 by RT PCR NEGATIVE NEGATIVE Final    Comment: (NOTE) SARS-CoV-2 target nucleic acids are NOT DETECTED.  The SARS-CoV-2 RNA is generally detectable in upper respiratory specimens during the acute phase of infection. The lowest concentration of SARS-CoV-2 viral copies this assay can detect is 138 copies/mL. A negative result does not preclude SARS-Cov-2 infection and should not be used as the sole basis for treatment or other patient management decisions. A negative result may occur with  improper specimen collection/handling, submission of specimen other than nasopharyngeal swab, presence of viral mutation(s) within the areas targeted by this assay, and inadequate number of viral copies(<138 copies/mL). A negative result must be combined with clinical observations, patient history, and epidemiological information. The expected result is Negative.  Fact Sheet for Patients:  EntrepreneurPulse.com.au  Fact Sheet for Healthcare Providers:   IncredibleEmployment.be  This test is no t yet approved or cleared by the [place] FDA and  has been authorized for detection and/or diagnosis of SARS-CoV-2 by FDA under an Emergency Use Authorization (EUA). This EUA will remain  in effect (meaning this test can be used) for the duration of the COVID-19 declaration under Section 564(b)(1) of the Act, 21 U.S.C.section 360bbb-3(b)(1), unless the authorization is terminated  or revoked sooner.       Influenza A by PCR NEGATIVE NEGATIVE Final   Influenza B by PCR NEGATIVE NEGATIVE Final    Comment: (NOTE) The Xpert Xpress SARS-CoV-2/FLU/RSV plus assay is intended as an aid in the diagnosis of influenza from Nasopharyngeal swab specimens and should not be used as a sole basis for treatment. Nasal washings and aspirates are unacceptable for Xpert Xpress SARS-CoV-2/FLU/RSV testing.  Fact Sheet for Patients: EntrepreneurPulse.com.au  Fact Sheet for Healthcare Providers: IncredibleEmployment.be  This test is not yet approved or cleared by the  [place] Architectural technologist and has been authorized for detection and/or diagnosis of SARS-CoV-2 by FDA under an Print production planner (EUA). This EUA will remain in effect (meaning this test can be used) for the duration of the COVID-19 declaration under Section 564(b)(1) of the Act, 21 U.S.C. section 360bbb-3(b)(1), unless the authorization is terminated or revoked.  Performed at [facility], [address] [place], [place] [address]   C Difficile Quick Screen w PCR reflex     Status: None   Collection Time: 04/09/20  2:44 PM   Specimen: STOOL  Result Value Ref Range Status   C Diff antigen NEGATIVE NEGATIVE Final   C Diff toxin NEGATIVE NEGATIVE Final   C Diff interpretation No C. difficile detected.  Final    Comment: Performed at [facility], [address] [place], [place] [address]  Gastrointestinal Panel  by PCR , Stool     Status: None   Collection Time: 04/09/20  2:44 PM   Specimen: Stool  Result Value Ref Range Status   Campylobacter species NOT DETECTED NOT DETECTED Final   Plesimonas shigelloides NOT DETECTED NOT DETECTED Final   Salmonella species NOT DETECTED NOT DETECTED Final   Yersinia enterocolitica NOT DETECTED NOT DETECTED Final   Vibrio species NOT DETECTED NOT DETECTED Final   Vibrio cholerae NOT DETECTED NOT DETECTED Final   Enteroaggregative E coli (EAEC) NOT DETECTED NOT DETECTED Final   Enteropathogenic E coli (EPEC) NOT DETECTED NOT DETECTED Final   Enterotoxigenic E coli (ETEC) NOT DETECTED NOT DETECTED Final   Shiga like toxin producing E coli (STEC) NOT DETECTED NOT DETECTED Final   Shigella/Enteroinvasive E coli (EIEC) NOT DETECTED NOT DETECTED Final   Cryptosporidium NOT DETECTED NOT DETECTED Final   Cyclospora cayetanensis NOT DETECTED NOT DETECTED Final   Entamoeba histolytica NOT DETECTED NOT DETECTED Final   Giardia lamblia NOT DETECTED NOT DETECTED Final   Adenovirus F40/41 NOT DETECTED NOT DETECTED Final   Astrovirus NOT DETECTED NOT DETECTED Final   Norovirus GI/GII NOT DETECTED NOT DETECTED Final   Rotavirus A NOT DETECTED NOT DETECTED Final   Sapovirus (I, II, IV, and V) NOT DETECTED NOT DETECTED Final    Comment: Performed at [facility], [address], [place], [place] [address]         Radiology Studies: CT ABDOMEN PELVIS W CONTRAST  Result Date: 04/09/2020 CLINICAL DATA:  86 year old female with lower abdominal, flank and back pain. EXAM: CT ABDOMEN AND PELVIS WITH CONTRAST TECHNIQUE: Multidetector CT imaging of the abdomen and pelvis was performed using the standard protocol following bolus administration of intravenous contrast. CONTRAST:  71mL OMNIPAQUE IOHEXOL 300 MG/ML  SOLN COMPARISON:  CT Abdomen and Pelvis 01/21/2018. FINDINGS: Lower chest: Cardiomegaly has progressed since 2019. No pericardial effusion. Chronic lung base  atelectasis or scarring not significantly changed. No pleural effusion. Hepatobiliary: Chronically absent gallbladder.  Negative liver. Pancreas: Chronic pancreatic atrophy. Spleen: Small benign appearing hypodense area in the spleen on series 2, image 16 is more apparent than 2019. Adrenals/Urinary Tract: Negative adrenal glands. Symmetric renal enhancement and contrast excretion. Normal proximal ureters. No hydronephrosis or nephrolithiasis. More distended bladder today, but the bladder is otherwise unremarkable. Stomach/Bowel: Redundant, decompressed large bowel in the pelvis. Redundant and increasingly gas distended transverse colon also with increased moderate to severe volume of retained stool which continues to the right colon. No large bowel inflammation identified. Terminal ileum is decompressed. Appendix not identified. No dilated small bowel. Decompressed stomach. Duodenum within normal limits. No  free air, free fluid. Vascular/Lymphatic: Aortoiliac calcified atherosclerosis. Chronic surgical clips or embolization coils along the abdominal aorta and IVC are unchanged. Major arterial structures in the abdomen and pelvis remain patent. Portal venous system is patent. No lymphadenopathy. Reproductive: Surgically absent. Other: No pelvic free fluid. Musculoskeletal: Osteopenia. Mild T12 compression fracture is new since 2019 and un healed with visible comminuted fracture lucency (sagittal image 67). No retropulsion or complicating features. Elsewhere the visible spine is stable since 2019, including chronic lower sacral fractures. No other acute osseous abnormality identified. IMPRESSION: 1. Acute to subacute T12 compression fracture with unhealed comminution. No retropulsion of bone or complicating features. Underlying osteopenia. 2. Gas and stool distended right and transverse colon, with redundant but decompressed distal large bowel. No mechanical bowel obstruction suspected. No inflamed bowel. Favor  constipation. 3. Cardiomegaly has progressed since 2019. Extensive Aortic Atherosclerosis (ICD10-I70.0). Chronic pancreatic atrophy. 4. No other acute or significant abnormality identified in the abdomen or pelvis. Electronically Signed   By: Genevie Ann M.D.   On: 04/09/2020 04:59        Scheduled Meds: . ascorbic acid  250 mg Oral Daily  . bisoprolol  5 mg Oral Daily  . docusate sodium  200 mg Oral BID  . furosemide  20 mg Oral BID  . levothyroxine  50 mcg Oral QAC breakfast  . losartan  12.5 mg Oral Daily  . multivitamin with minerals  1 tablet Oral Daily  . psyllium  1 packet Oral Daily  . rivaroxaban  20 mg Oral Q supper   Continuous Infusions: . levofloxacin (LEVAQUIN) IV       LOS: 0 days    Time spent: 37 mins    Wyvonnia Dusky, MD [facility] Pager [phone]  If 7PM-7AM, please contact night-coverage 04/10/2020, 7:27 AM

## 2020-04-10 NOTE — Consult Note (Signed)
Reason for Consult: Back pain Referring Physician: Dr. Eudelia Bunch Jodi Reeves is an 86 y.o. female.  HPI: Patient is a 86 year old with fairly recent onset of mid to low back pain.  She has a history that says she sometimes feels like the back is breaking recently it became much worse about 2 weeks ago she reports she is really started complaining about but not had no history of injury.  She had CT here that shows acute to subacute T12 fracture on CT scan.  Denies prior compression fractures that she is aware of.  She is normally fairly independent although she does not drive she will go to the through the grocery store holding the cart when she is brought to the store.  Otherwise she takes care of her self around her home.  Past Medical History:  Diagnosis Date  . Allergy   . Anxiety   . Balance disorder   . Chronic diastolic CHF (congestive heart failure) ([place])   . Colon polyps   . Coronary artery disease, non-occlusive    a. LHC 11/2003: 10% LAD stenosis, 20% LCx stenosis, and 40% RCA stenosis; b. nuc stress test 12/15: small region of mild ischemia in the apical region with WMA also noted in the apical region, EF 60%. She declined invasive evaluation at that time  . DM2 (diabetes mellitus, type 2) ([place])   . Fall    04/2017 see [facility]where [facility] multiple fractures, brusing   . Falls frequently    h/o left shoudler/wrist fracture and left fingers numb  . GERD (gastroesophageal reflux disease)    esophageal web  . 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 History of chicken pox   . History of eating disorder   . HLD (hyperlipidemia)   . HTN (hypertension)   . Hypothyroidism   . IBS (irritable bowel syndrome)    diarrhea   . Incontinence of bowel   . Osteoporosis   . Ovarian cancer ([place])    1980  . Persistent atrial fibrillation ([place])    a. CHADS2VASc = > 8 (CHF, HTN, age x 2, DM, TIA x 2, female); b. on Xarelto  . SBO (small bowel obstruction) ([place])    1998  . TIA  (transient ischemic attack)   . UTI (urinary tract infection)   . Venous insufficiency     Past Surgical History:  Procedure Laterality Date  . 2nd look laparotomy  1982  . ABDOMINAL HYSTERECTOMY     for ovarian cancer s/p hysterectomy total 1976 and exp lap 1980  . APPENDECTOMY    . CARDIAC CATHETERIZATION  8/05   neg; a fib found   . CARPAL TUNNEL RELEASE Left 03/06/2018   Procedure: CARPAL TUNNEL RELEASE ENDOSCOPIC;  Surgeon: Corky Mull, MD;  Location: [place];  Service: Orthopedics;  Laterality: Left;  diabetic - oral meds  . CARPAL TUNNEL RELEASE     left CTS Dr.Poggi  . cataract OD  2002  . cataract surgery  5/07   R  . CHOLECYSTECTOMY  1986  . DEXA  9/04 and 1/02  . EGD/dilation/colon  1/06  . ESOPHAGOGASTRODUODENOSCOPY (EGD) WITH PROPOFOL N/A 05/16/2016   Procedure: ESOPHAGOGASTRODUODENOSCOPY (EGD) WITH PROPOFOL with dilation;  Surgeon: Lucilla Lame, MD;  Location: [facility];  Service: Endoscopy;  Laterality: N/A;  . FEMORAL HERNIA REPAIR  1958   R  . fracture L elbow/wrist  2000  . intussception/obstruction  12/98  . JOINT REPLACEMENT     left  shoulder  . kidney stone x2  1991  . laminectomy L4-5  1971  . LUMBAR LAMINECTOMY     1970 ruptured disc   . MOUTH SURGERY    . myoview stress  8/05   syncope (-)  . REVERSE SHOULDER ARTHROPLASTY Left 10/06/2014   Procedure: REVERSE SHOULDER ARTHROPLASTY;  Surgeon: Corky Mull, MD;  Location: [facility];  Service: Orthopedics;  Laterality: Left;  . stillbirth  1969  . TOTAL ABDOMINAL HYSTERECTOMY W/ BILATERAL SALPINGOOPHORECTOMY  1980   ovarian cancer  . WRIST FRACTURE SURGERY  11/05   R    Family History  Problem Relation Age of Onset  . Early death Father   . Diabetes Mother   . Heart disease Mother   . Arthritis Brother   . Cancer Brother   . Depression Brother   . Hearing loss Brother   . Arthritis Brother   . Cancer Brother        colon  . Hearing loss Brother   . Cancer Brother   .  Diabetes Brother   . Hearing loss Brother   . Heart disease Brother   . Hyperlipidemia Brother   . Heart disease Sister   . Hypertension Sister   . Stroke Sister   . Hearing loss Daughter   . Hypertension Daughter   . Diabetes Paternal Grandfather   . Hearing loss Sister   . Heart disease Sister   . Arthritis Sister   . Depression Sister   . Hearing loss Sister   . Heart disease Sister   . COPD Brother   . Early death Brother   . Early death Brother     Social History:  reports that she has never smoked. She has never used smokeless tobacco. She reports that she does not drink alcohol and does not use drugs.  Allergies:  Allergies  Allergen Reactions  . Amiodarone Other (See Comments)    Severe Thyroid issues   . Penicillins Anaphylaxis and Other (See Comments)    Has patient had a PCN reaction causing immediate rash, facial/tongue/throat swelling, SOB or lightheadedness with hypotension: Yes Has patient had a PCN reaction causing severe rash involving mucus membranes or skin necrosis: No Has patient had a PCN reaction that required hospitalization: No Has patient had a PCN reaction occurring within the last 10 years: No If all of the above answers are "NO", then may proceed with Cephalosporin use.   . Actos [Pioglitazone]     Not effective   . Amaryl [Glimepiride]     Not effective    . Atorvastatin Other (See Comments)    Muscle aches & All statins per Patient  . Bentyl [Dicyclomine Hcl]     Could not tolerate nausea, reduced concentration, h/a   . Ciprofloxacin Other (See Comments)    High blood pressure    . Codeine Nausea And Vomiting  . Ezetimibe-Simvastatin Other (See Comments)    Muscle aches, nausea, back pain   . Fosamax [Alendronate]     dysphagia  . Lovastatin Other (See Comments)    Myalgias  . Macrobid [Nitrofurantoin Macrocrystal]     Severe Itching, rash   . Protonix [Pantoprazole Sodium]     Esophageal problems    . Rosiglitazone Maleate  Other (See Comments)    Edema  . Statins     Muscle and joint aches to all statins  . Victoza [Liraglutide]     Nausea   . Alendronate Sodium Rash    Dysphagia and ulceration   .  Bactrim [Sulfamethoxazole-Trimethoprim] Rash    itching  . Sulfa Antibiotics Rash    Itching     Medications: I have reviewed the patient's current medications.  Results for orders placed or performed during the hospital encounter of 04/09/20 (from the past 48 hour(s))  Lipase, blood     Status: None   Collection Time: 04/08/20  8:13 PM  Result Value Ref Range   Lipase 21 11 - 51 U/L    Comment: Performed at [facility], [address] [place], [place] [address]  Comprehensive metabolic panel     Status: Abnormal   Collection Time: 04/08/20  8:13 PM  Result Value Ref Range   Sodium 136 135 - 145 mmol/L   Potassium 4.0 3.5 - 5.1 mmol/L   Chloride 97 (L) 98 - 111 mmol/L   CO2 26 22 - 32 mmol/L   Glucose, Bld 204 (H) 70 - 99 mg/dL    Comment: Glucose reference range applies only to samples taken after fasting for at least 8 hours.   BUN 30 (H) 8 - 23 mg/dL   Creatinine, Ser 0.94 0.44 - 1.00 mg/dL   Calcium 9.5 8.9 - 10.3 mg/dL   Total Protein 8.0 6.5 - 8.1 g/dL   Albumin 3.7 3.5 - 5.0 g/dL   AST 33 15 - 41 U/L   ALT 11 0 - 44 U/L   Alkaline Phosphatase 69 38 - 126 U/L   Total Bilirubin 1.1 0.3 - 1.2 mg/dL   GFR, Estimated 58 (L) >60 mL/min    Comment: (NOTE) Calculated using the CKD-EPI Creatinine Equation (2021)    Anion gap 13 5 - 15    Comment: Performed at [facility], [address] [place], [place] [address]  CBC     Status: Abnormal   Collection Time: 04/08/20  8:13 PM  Result Value Ref Range   WBC 12.1 (H) 4.0 - 10.5 K/uL   RBC 3.65 (L) 3.87 - 5.11 MIL/uL   Hemoglobin 12.2 12.0 - 15.0 g/dL   HCT 36.2 36.0 - 46.0 %   MCV 99.2 80.0 - 100.0 fL   MCH 33.4 26.0 - 34.0 pg   MCHC 33.7 30.0 - 36.0 g/dL   RDW 13.2 11.5 - 15.5 %   Platelets 200 150 - 400 K/uL    nRBC 0.0 0.0 - 0.2 %    Comment: Performed at [facility], [address] [place], [place] [address]  Urinalysis, Complete w Microscopic     Status: Abnormal   Collection Time: 04/08/20  9:34 PM  Result Value Ref Range   Color, Urine YELLOW (A) YELLOW   APPearance CLEAR (A) CLEAR   Specific Gravity, Urine 1.011 1.005 - 1.030   pH 5.0 5.0 - 8.0   Glucose, UA NEGATIVE NEGATIVE mg/dL   Hgb urine dipstick NEGATIVE NEGATIVE   Bilirubin Urine NEGATIVE NEGATIVE   Ketones, ur NEGATIVE NEGATIVE mg/dL   Protein, ur NEGATIVE NEGATIVE mg/dL   Nitrite NEGATIVE NEGATIVE   Leukocytes,Ua NEGATIVE NEGATIVE   RBC / HPF 6-10 0 - 5 RBC/hpf   WBC, UA 6-10 0 - 5 WBC/hpf   Bacteria, UA NONE SEEN NONE SEEN   Squamous Epithelial / LPF NONE SEEN 0 - 5   Mucus PRESENT    Hyaline Casts, UA PRESENT     Comment: Performed at [facility], [address], [place], [place] [address]  Urine Culture     Status: None (Preliminary result)   Collection Time: 04/08/20  9:34  PM   Specimen: Urine, Random  Result Value Ref Range   Specimen Description      URINE, RANDOM Performed at [facility], [address] [place], [place] [address]    Special Requests      Normal Performed at [facility], [address] [place], [place] [address]    Culture      CULTURE REINCUBATED FOR BETTER GROWTH Performed at [facility], [place] [address] [place], [place] [address]    Report Status PENDING   Lactic acid, plasma     Status: None   Collection Time: 04/09/20  2:50 AM  Result Value Ref Range   Lactic Acid, Venous 1.7 0.5 - 1.9 mmol/L    Comment: Performed at [facility], [address], [place], [place] [address]  Resp Panel by RT-PCR (Flu A&B, Covid) Nasopharyngeal Swab     Status: None   Collection Time: 04/09/20  3:01 AM   Specimen: Nasopharyngeal Swab; Nasopharyngeal(NP) swabs in vial transport medium  Result Value Ref Range   SARS Coronavirus 2 by RT PCR  NEGATIVE NEGATIVE    Comment: (NOTE) SARS-CoV-2 target nucleic acids are NOT DETECTED.  The SARS-CoV-2 RNA is generally detectable in upper respiratory specimens during the acute phase of infection. The lowest concentration of SARS-CoV-2 viral copies this assay can detect is 138 copies/mL. A negative result does not preclude SARS-Cov-2 infection and should not be used as the sole basis for treatment or other patient management decisions. A negative result may occur with  improper specimen collection/handling, submission of specimen other than nasopharyngeal swab, presence of viral mutation(s) within the areas targeted by this assay, and inadequate number of viral copies(<138 copies/mL). A negative result must be combined with clinical observations, patient history, and epidemiological information. The expected result is Negative.  Fact Sheet for Patients:  EntrepreneurPulse.com.au  Fact Sheet for Healthcare Providers:  IncredibleEmployment.be  This test is no t yet approved or cleared by the [place] FDA and  has been authorized for detection and/or diagnosis of SARS-CoV-2 by FDA under an Emergency Use Authorization (EUA). This EUA will remain  in effect (meaning this test can be used) for the duration of the COVID-19 declaration under Section 564(b)(1) of the Act, 21 U.S.C.section 360bbb-3(b)(1), unless the authorization is terminated  or revoked sooner.       Influenza A by PCR NEGATIVE NEGATIVE   Influenza B by PCR NEGATIVE NEGATIVE    Comment: (NOTE) The Xpert Xpress SARS-CoV-2/FLU/RSV plus assay is intended as an aid in the diagnosis of influenza from Nasopharyngeal swab specimens and should not be used as a sole basis for treatment. Nasal washings and aspirates are unacceptable for Xpert Xpress SARS-CoV-2/FLU/RSV testing.  Fact Sheet for Patients: EntrepreneurPulse.com.au  Fact Sheet for Healthcare  Providers: IncredibleEmployment.be  This test is not yet approved or cleared by the [place] FDA and has been authorized for detection and/or diagnosis of SARS-CoV-2 by FDA under an Emergency Use Authorization (EUA). This EUA will remain in effect (meaning this test can be used) for the duration of the COVID-19 declaration under Section 564(b)(1) of the Act, 21 U.S.C. section 360bbb-3(b)(1), unless the authorization is terminated or revoked.  Performed at [facility], [address] [place], [place] [address]   Vitamin B12     Status: Abnormal   Collection Time: 04/09/20  8:37 AM  Result Value Ref Range   Vitamin B-12 1,396 (H) 180 - 914 pg/mL    Comment: (NOTE) This  assay is not validated for testing neonatal or myeloproliferative syndrome specimens for Vitamin B12 levels. Performed at [facility], [place] [address], [place], [place] [address]   CBC     Status: Abnormal   Collection Time: 04/09/20  8:37 AM  Result Value Ref Range   WBC 8.5 4.0 - 10.5 K/uL   RBC 3.39 (L) 3.87 - 5.11 MIL/uL   Hemoglobin 11.3 (L) 12.0 - 15.0 g/dL   HCT 33.9 (L) 36.0 - 46.0 %   MCV 100.0 80.0 - 100.0 fL   MCH 33.3 26.0 - 34.0 pg   MCHC 33.3 30.0 - 36.0 g/dL   RDW 13.2 11.5 - 15.5 %   Platelets 162 150 - 400 K/uL   nRBC 0.0 0.0 - 0.2 %    Comment: Performed at [facility], [address], [place], [place] [address]  Basic metabolic panel     Status: Abnormal   Collection Time: 04/09/20  8:37 AM  Result Value Ref Range   Sodium 135 135 - 145 mmol/L   Potassium 3.7 3.5 - 5.1 mmol/L   Chloride 99 98 - 111 mmol/L   CO2 24 22 - 32 mmol/L   Glucose, Bld 177 (H) 70 - 99 mg/dL    Comment: Glucose reference range applies only to samples taken after fasting for at least 8 hours.   BUN 27 (H) 8 - 23 mg/dL   Creatinine, Ser 0.74 0.44 - 1.00 mg/dL   Calcium 8.6 (L) 8.9 - 10.3 mg/dL   GFR, Estimated >60 >60 mL/min    Comment: (NOTE) Calculated using  the CKD-EPI Creatinine Equation (2021)    Anion gap 12 5 - 15    Comment: Performed at [facility], [address] [place], [place] [address]  C Difficile Quick Screen w PCR reflex     Status: None   Collection Time: 04/09/20  2:44 PM   Specimen: STOOL  Result Value Ref Range   C Diff antigen NEGATIVE NEGATIVE   C Diff toxin NEGATIVE NEGATIVE   C Diff interpretation No C. difficile detected.     Comment: Performed at [facility], [address] [place], [place] [address]  Gastrointestinal Panel by PCR , Stool     Status: None   Collection Time: 04/09/20  2:44 PM   Specimen: Stool  Result Value Ref Range   Campylobacter species NOT DETECTED NOT DETECTED   Plesimonas shigelloides NOT DETECTED NOT DETECTED   Salmonella species NOT DETECTED NOT DETECTED   Yersinia enterocolitica NOT DETECTED NOT DETECTED   Vibrio species NOT DETECTED NOT DETECTED   Vibrio cholerae NOT DETECTED NOT DETECTED   Enteroaggregative E coli (EAEC) NOT DETECTED NOT DETECTED   Enteropathogenic E coli (EPEC) NOT DETECTED NOT DETECTED   Enterotoxigenic E coli (ETEC) NOT DETECTED NOT DETECTED   Shiga like toxin producing E coli (STEC) NOT DETECTED NOT DETECTED   Shigella/Enteroinvasive E coli (EIEC) NOT DETECTED NOT DETECTED   Cryptosporidium NOT DETECTED NOT DETECTED   Cyclospora cayetanensis NOT DETECTED NOT DETECTED   Entamoeba histolytica NOT DETECTED NOT DETECTED   Giardia lamblia NOT DETECTED NOT DETECTED   Adenovirus F40/41 NOT DETECTED NOT DETECTED   Astrovirus NOT DETECTED NOT DETECTED   Norovirus GI/GII NOT DETECTED NOT DETECTED   Rotavirus A NOT DETECTED NOT DETECTED   Sapovirus (I, II, IV, and V) NOT DETECTED NOT DETECTED    Comment: Performed at [facility], [address], [place], [place] [address]  VITAMIN D  25 Hydroxy (Vit-D Deficiency, Fractures)     Status: None   Collection Time: 04/09/20  9:28 PM  Result Value Ref Range   Vit D, 25-Hydroxy 32.88 30 - 100  ng/mL    Comment: (NOTE) Vitamin D deficiency has been defined by the [place] practice guideline as a level of serum 25-OH  vitamin D less than 20 ng/mL (1,2). The Endocrine Society went on to  further define vitamin D insufficiency as a level between 21 and 29  ng/mL (2).  1. IOM (Institute of Medicine). 2010. Dietary reference intakes for  calcium and D. [place]: The [employer]. 2. Holick MF, Binkley [place], Bischoff-Ferrari HA, et al. Evaluation,  treatment, and prevention of vitamin D deficiency: an Endocrine  Society clinical practice guideline, JCEM. 2011 Jul; 96(7): 1911-30.  Performed at [facility], [place] [address] [place], [place] [address]   CBC     Status: Abnormal   Collection Time: 04/10/20  5:59 AM  Result Value Ref Range   WBC 8.4 4.0 - 10.5 K/uL   RBC 3.56 (L) 3.87 - 5.11 MIL/uL   Hemoglobin 12.0 12.0 - 15.0 g/dL   HCT 35.6 (L) 36.0 - 46.0 %   MCV 100.0 80.0 - 100.0 fL   MCH 33.7 26.0 - 34.0 pg   MCHC 33.7 30.0 - 36.0 g/dL   RDW 13.0 11.5 - 15.5 %   Platelets 168 150 - 400 K/uL   nRBC 0.0 0.0 - 0.2 %    Comment: Performed at [facility], [address] [place], [place] [address]  Basic metabolic panel     Status: Abnormal   Collection Time: 04/10/20  5:59 AM  Result Value Ref Range   Sodium 138 135 - 145 mmol/L   Potassium 3.8 3.5 - 5.1 mmol/L   Chloride 102 98 - 111 mmol/L   CO2 26 22 - 32 mmol/L   Glucose, Bld 194 (H) 70 - 99 mg/dL    Comment: Glucose reference range applies only to samples taken after fasting for at least 8 hours.   BUN 18 8 - 23 mg/dL   Creatinine, Ser 0.60 0.44 - 1.00 mg/dL   Calcium 8.2 (L) 8.9 - 10.3 mg/dL   GFR, Estimated >60 >60 mL/min    Comment: (NOTE) Calculated using the CKD-EPI Creatinine Equation (2021)    Anion gap 10 5 - 15    Comment: Performed at [facility], [address] [place], [place] [address]    CT ABDOMEN PELVIS W  CONTRAST  Result Date: 04/09/2020 CLINICAL DATA:  86 year old female with lower abdominal, flank and back pain. EXAM: CT ABDOMEN AND PELVIS WITH CONTRAST TECHNIQUE: Multidetector CT imaging of the abdomen and pelvis was performed using the standard protocol following bolus administration of intravenous contrast. CONTRAST:  106mL OMNIPAQUE IOHEXOL 300 MG/ML  SOLN COMPARISON:  CT Abdomen and Pelvis 01/21/2018. FINDINGS: Lower chest: Cardiomegaly has progressed since 2019. No pericardial effusion. Chronic lung base atelectasis or scarring not significantly changed. No pleural effusion. Hepatobiliary: Chronically absent gallbladder.  Negative liver. Pancreas: Chronic pancreatic atrophy. Spleen: Small benign appearing hypodense area in the spleen on series 2, image 16 is more apparent than 2019. Adrenals/Urinary Tract: Negative adrenal glands. Symmetric renal enhancement and contrast excretion. Normal proximal ureters. No hydronephrosis or nephrolithiasis. More distended bladder today, but the bladder is otherwise unremarkable. Stomach/Bowel: Redundant, decompressed large bowel in the pelvis. Redundant and increasingly gas distended transverse colon also with  increased moderate to severe volume of retained stool which continues to the right colon. No large bowel inflammation identified. Terminal ileum is decompressed. Appendix not identified. No dilated small bowel. Decompressed stomach. Duodenum within normal limits. No free air, free fluid. Vascular/Lymphatic: Aortoiliac calcified atherosclerosis. Chronic surgical clips or embolization coils along the abdominal aorta and IVC are unchanged. Major arterial structures in the abdomen and pelvis remain patent. Portal venous system is patent. No lymphadenopathy. Reproductive: Surgically absent. Other: No pelvic free fluid. Musculoskeletal: Osteopenia. Mild T12 compression fracture is new since 2019 and un healed with visible comminuted fracture lucency (sagittal image 67).  No retropulsion or complicating features. Elsewhere the visible spine is stable since 2019, including chronic lower sacral fractures. No other acute osseous abnormality identified. IMPRESSION: 1. Acute to subacute T12 compression fracture with unhealed comminution. No retropulsion of bone or complicating features. Underlying osteopenia. 2. Gas and stool distended right and transverse colon, with redundant but decompressed distal large bowel. No mechanical bowel obstruction suspected. No inflamed bowel. Favor constipation. 3. Cardiomegaly has progressed since 2019. Extensive Aortic Atherosclerosis (ICD10-I70.0). Chronic pancreatic atrophy. 4. No other acute or significant abnormality identified in the abdomen or pelvis. Electronically Signed   By: Genevie Ann M.D.   On: 04/09/2020 04:59    Review of Systems Blood pressure (!) 155/86, pulse 80, temperature 98 F (36.7 C), temperature source Oral, resp. rate 16, height 5\' 3"  (1.6 m), weight 78.5 kg, SpO2 93 %. Physical Exam She is tender at T12 where there is a palpable kyphosis and also around L3 with significant pain mild paraspinous muscle spasm skin is otherwise intact. There is no clonus and able flex extend the toes. She is in obvious distress when leaning forward or sitting up independently. Assessment/Plan: T12 compression fracture likely acute to subacute causing most of her pain with her tenderness at L3 microcavitation is to get an MRI which has been ordered of the lumbar spine.  I will bring a brochure and bone model to review kyphoplasty.  She is concerned that she cannot be put under anesthesia and I told her we do not normally do that we can do this under local with sedation and often treat people people her age and older successfully.  Hessie Knows 04/10/2020, 2:14 PM

## 2020-04-10 NOTE — Progress Notes (Signed)
PT Cancellation Note  Patient Details Name: Jodi Reeves MRN: [id number] DOB: [birth year]   Cancelled Treatment:    Reason Eval/Treat Not Completed: Medical issues which prohibited therapy.  Awaiting ortho consult.  Retry as time and pt allow.   Ramond Dial 04/10/2020, 10:02 AM   Mee Hives, PT MS Acute Rehab Dept. Number: [place]

## 2020-04-10 NOTE — Progress Notes (Signed)
PHARMACY NOTE:  ANTIMICROBIAL RENAL DOSAGE ADJUSTMENT  Current antimicrobial regimen includes a mismatch between antimicrobial dosage and estimated renal function.  As per policy approved by the Pharmacy & Therapeutics and Medical Executive Committees, the antimicrobial dosage will be adjusted accordingly.  Current antimicrobial dosage:  Levofloxacin 750mg  every 24 hours   Indication: UTI  Renal Function:  Estimated Creatinine Clearance: 46.3 mL/min (by C-G formula based on SCr of 0.6 mg/dL).  Antimicrobial dosage has been changed to:  Levofloxacin 750mg  every 48 hours for CrCl <51ml/min   Thank you for allowing pharmacy to be a part of this patient's care.  Pernell Dupre, PharmD, BCPS Clinical Pharmacist 04/10/2020 11:28 AM

## 2020-04-10 NOTE — Progress Notes (Signed)
OT Cancellation Note  Patient Details Name: Jodi Reeves MRN: [id number] DOB: 1928/03/02   Cancelled Treatment:    Reason Eval/Treat Not Completed: Medical issues which prohibited therapy. Per conversation c PT, request from MD to hold PT/OT services pending outcome of ortho consult for acute/subacute T12 compression fracture.  Will attempt to see pt at a future date/time as medically appropriate.   Dessie Coma, M.S. OTR/L  04/10/20, 8:40 AM  ascom [phone]

## 2020-04-11 LAB — CBC
HCT: 32.4 % — ABNORMAL LOW (ref 36.0–46.0)
Hemoglobin: 11.3 g/dL — ABNORMAL LOW (ref 12.0–15.0)
MCH: 34.5 pg — ABNORMAL HIGH (ref 26.0–34.0)
MCHC: 34.9 g/dL (ref 30.0–36.0)
MCV: 98.8 fL (ref 80.0–100.0)
Platelets: 148 10*3/uL — ABNORMAL LOW (ref 150–400)
RBC: 3.28 MIL/uL — ABNORMAL LOW (ref 3.87–5.11)
RDW: 13.1 % (ref 11.5–15.5)
WBC: 7.4 10*3/uL (ref 4.0–10.5)
nRBC: 0 % (ref 0.0–0.2)

## 2020-04-11 LAB — URINE CULTURE
Culture: 20000 — AB
Special Requests: NORMAL

## 2020-04-11 LAB — BASIC METABOLIC PANEL WITH GFR
Anion gap: 9 (ref 5–15)
BUN: 12 mg/dL (ref 8–23)
CO2: 26 mmol/L (ref 22–32)
Calcium: 7.9 mg/dL — ABNORMAL LOW (ref 8.9–10.3)
Chloride: 101 mmol/L (ref 98–111)
Creatinine, Ser: 0.52 mg/dL (ref 0.44–1.00)
GFR, Estimated: >60 mL/min
Glucose, Bld: 185 mg/dL — ABNORMAL HIGH (ref 70–99)
Potassium: 3.2 mmol/L — ABNORMAL LOW (ref 3.5–5.1)
Sodium: 136 mmol/L (ref 135–145)

## 2020-04-11 MED ORDER — POTASSIUM CHLORIDE CRYS ER 20 MEQ PO TBCR
40.0000 meq | EXTENDED_RELEASE_TABLET | Freq: Once | ORAL | Status: AC
  Administered 2020-04-11: 40 meq via ORAL
  Filled 2020-04-11: qty 2

## 2020-04-11 MED ORDER — SODIUM CHLORIDE 0.9 % IV SOLN
INTRAVENOUS | Status: DC | PRN
  Administered 2020-04-11: 250 mL via INTRAVENOUS

## 2020-04-11 MED ORDER — SODIUM CHLORIDE 0.9 % IV SOLN
100.0000 mg | Freq: Two times a day (BID) | INTRAVENOUS | Status: DC
  Administered 2020-04-11 – 2020-04-15 (×7): 100 mg via INTRAVENOUS
  Filled 2020-04-11 (×10): qty 100

## 2020-04-11 MED ORDER — TRAMADOL HCL 50 MG PO TABS
50.0000 mg | ORAL_TABLET | Freq: Four times a day (QID) | ORAL | Status: DC | PRN
  Administered 2020-04-12 – 2020-04-16 (×5): 50 mg via ORAL
  Filled 2020-04-11 (×5): qty 1

## 2020-04-11 NOTE — Progress Notes (Signed)
Reviewed MRI, shows only T 12 fracture. Will discuss again in am with her son. Tentatively planning kypho tomorrow.

## 2020-04-11 NOTE — Evaluation (Signed)
Occupational Therapy Evaluation Patient Details Name: SOMA BACHAND MRN: [id number] DOB: [birth year] Today's Date: 04/11/2020    History of Present Illness MADDYN LIEURANCE is a 86 y.o. female with medical history significant for persistent atrial fibrillation since 2005 on Xarelto, hypertension, bilateral lymphedema, hypothyroid, presented to the emergency department for chief concerns of abdominal pain.CT 04/09/20: T12 compression fracture likely acute to subacute   Clinical Impression   Ms Carlucci was seen for OT evaluation this date. Prior to hospital admission, pt was MOD I for mobility and ADLs using RW as needed. Pt lives alone in "loft home" with 5 STE and B rails. Reports lives on main level and son available PRN. Pt presents to acute OT demonstrating impaired ADL performance and functional mobility 2/2 decreased activty tolerance, pain, functional strength/balance deficits, and decreased LB access.   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 Pt currently requires Increased time exit bed, MOD A for BLE return to bed (pain limited). MAX A don B socks seated EOB. SUPERVISION seated grooming - tolerates ~20 min. MOD A + RW sit<>stand - VCs for technique and upright posture. Pt would benefit from skilled OT to address noted impairments and functional limitations (see below for any additional details) in order to maximize safety and independence while minimizing falls risk and caregiver burden. Upon hospital discharge, recommend STR to maximize pt safety and return to PLOF.     Follow Up Recommendations  SNF    Equipment Recommendations  Other (comment) (TBD)    Recommendations for Other Services       Precautions / Restrictions Precautions Precautions: Fall Restrictions Weight Bearing Restrictions: Yes RLE Weight Bearing: Weight bearing as tolerated LLE Weight Bearing: Weight bearing as tolerated      Mobility Bed Mobility Overal bed  mobility: Needs Assistance Bed Mobility: Supine to Sit;Sit to Supine     Supine to sit: Supervision;HOB elevated Sit to supine: Mod assist   General bed mobility comments: Increased time exit bed, MOD A for BLE return to bed (pain limited)    Transfers Overall transfer level: Needs assistance Equipment used: Rolling walker (2 wheeled) Transfers: Sit to/from Stand Sit to Stand: Mod assist         General transfer comment: VCs for technique and upright posture    Balance Overall balance assessment: Needs assistance Sitting-balance support: No upper extremity supported;Feet supported Sitting balance-Leahy Scale: Good     Standing balance support: Bilateral upper extremity supported Standing balance-Leahy Scale: Poor                             ADL either performed or assessed with clinical judgement   ADL Overall ADL's : Needs assistance/impaired                                       General ADL Comments: MAX A don B socks seated EOB. SUPERVISION seated grooming - tolerates ~20 min. Anticipate MOD A + RW for BSC t/f     Vision Baseline Vision/History: Wears glasses              Pertinent Vitals/Pain Pain Assessment: Faces Faces Pain Scale: Hurts even more Pain Location: low back Pain Descriptors / Indicators: Discomfort;Grimacing Pain Intervention(s): Limited activity within patient's tolerance;Repositioned;Monitored during session     Hand Dominance Right   Extremity/Trunk Assessment  Upper Extremity Assessment Upper Extremity Assessment: Generalized weakness   Lower Extremity Assessment Lower Extremity Assessment: Generalized weakness       Communication Communication Communication: No difficulties   Cognition Arousal/Alertness: Awake/alert Behavior During Therapy: WFL for tasks assessed/performed Overall Cognitive Status: Within Functional Limits for tasks assessed                                      General Comments       Exercises Exercises: Other exercises Other Exercises Other Exercises: Pt educated re: OT role, DME recs, d/c recs, falls prevention, ECS, pain mgmt Other Exercises: LBD, grooming, UBD, sup<>sit, sit<>stand, sitting/standing balance/tolerance   Shoulder Instructions      Home Living Family/patient expects to be discharged to:: Private residence Living Arrangements: Alone Available Help at Discharge: Family;Available PRN/intermittently Type of Home: House Home Access: Ramped entrance;Stairs to enter Entrance Stairs-Number of Steps: 5 Entrance Stairs-Rails: Can reach both Home Layout: Two level;Able to live on main level with bedroom/bathroom Alternate Level Stairs-Number of Steps: 13   Bathroom Shower/Tub: Walk-in shower         Home Equipment: Environmental consultant - 2 wheels;Cane - single point;Shower seat - built in   Additional Comments: Reports 1 son nearby but works full time and travels for work      Prior Functioning/Environment Level of Independence: Independent with assistive device(s)        Comments: Pt is retired Designer, jewellery. Reports is scared of shower so sponge bathes mostly. Does not drive but if taken to grocery store uses cart to ambulate        OT Problem List: Decreased strength;Decreased range of motion;Decreased activity tolerance;Impaired balance (sitting and/or standing);Decreased safety awareness      OT Treatment/Interventions: Self-care/ADL training;Therapeutic exercise;DME and/or AE instruction;Energy conservation;Therapeutic activities;Patient/family education;Balance training    OT Goals(Current goals can be found in the care plan section) Acute Rehab OT Goals Patient Stated Goal: To return home safely OT Goal Formulation: With patient Time For Goal Achievement: 04/25/20 Potential to Achieve Goals: Good ADL Goals Pt Will Perform Grooming: with modified independence;sitting Pt Will Perform Lower Body Dressing: with  modified independence;sitting/lateral leans Pt Will Transfer to Toilet: with supervision;stand pivot transfer;bedside commode (c LRAD PRN)  OT Frequency: Min 2X/week   Barriers to D/C: Decreased caregiver support             AM-PAC OT "6 Clicks" Daily Activity     Outcome Measure Help from another person eating meals?: None Help from another person taking care of personal grooming?: A Little Help from another person toileting, which includes using toliet, bedpan, or urinal?: A Lot Help from another person bathing (including washing, rinsing, drying)?: A Lot Help from another person to put on and taking off regular upper body clothing?: None Help from another person to put on and taking off regular lower body clothing?: A Lot 6 Click Score: 17   End of Session Equipment Utilized During Treatment: Rolling walker Nurse Communication: Mobility status  Activity Tolerance: Patient tolerated treatment well Patient left: in bed;with call bell/phone within reach;with bed alarm set;Other (comment) (MD in room end of session)  OT Visit Diagnosis: Other abnormalities of gait and mobility (R26.89)                Time: 8185-6314 OT Time Calculation (min): 47 min Charges:  OT General Charges $OT Visit: 1 Visit OT Evaluation $OT Eval Moderate Complexity:  1 Mod OT Treatments $Self Care/Home Management : 23-37 mins $Therapeutic Activity: 8-22 mins  Dessie Coma, M.S. OTR/L  04/11/20, 1:49 PM  ascom [phone]

## 2020-04-11 NOTE — Progress Notes (Signed)
PROGRESS NOTE    Jodi Reeves  IRC:[id number] DOB: 1927/05/17 DOA: 04/09/2020 PCP: [name], Nino Glow, MD   Assessment & Plan:   Principal Problem:   Abdominal pain Active Problems:   Hypothyroidism   Essential hypertension   Atrial fibrillation (HCC)   Gait instability   Diabetes, polyneuropathy (HCC)   Chronic diastolic CHF (congestive heart failure) (HCC)   Coronary artery disease, non-occlusive   Type 2 diabetes mellitus with diabetic polyneuropathy, without long-term current use of insulin (HCC)   PAD (peripheral artery disease) (HCC)   UTI (urinary tract infection)    Abdominal pain: likely secondary to constipation as per CT scan. No obstruction. Resolved  Generalized weakness: PT/OT recs SNF   T12 compression fracture: acute vs subacute as per CT. Likely secondary to multiple falls. Possible kyphoplasty as per ortho surg. Ortho surg recs apprec   Possible peripheral neuropathy: not on any gabapentin or lyrica at home as per med rec  Hypokalemia: KCL repleted. Will continue to monitor  Thrombocytopenia: etiology unclear. Will continue to monitor   Leukocytosis: resolved  Hypothyroidism: continue on home dose of levothyroxine   HTN: continue on home dose of losartan, bisoprolol.    UTI: urine cx is growing stap haemolyticus. Abxs changed to doxycycline   Persistent atrial fibrillation: continue on bisoprolol, xarelto     DVT prophylaxis: xarelto Code Status: full Family Communication: discussed pt's care w/ pt's son, Tim, and answered his questions  Disposition Plan: depends on PT/OT recs   Status is: Inpatient  Remains inpatient appropriate because:Ongoing diagnostic testing needed not appropriate for outpatient work up, Unsafe d/c plan and IV treatments appropriate due to intensity of illness or inability to take PO   Dispo: The patient is from: Home              Anticipated d/c is to: SNF              Anticipated d/c date is: 3  days              Patient currently is not medically stable to d/c.          Consultants:      Procedures:   Antimicrobials: doxycycline   Subjective: Pt c/o burning w/ urination   Objective: Vitals:   04/10/20 0840 04/10/20 1028 04/10/20 2104 04/11/20 0616  BP: (!) 173/95 (!) 155/86 (!) 155/78 (!) 148/75  Pulse: 96 80 97 83  Resp: 19 16 20 20   Temp: 98.8 F (37.1 C) 98 F (36.7 C) 98.6 F (37 C) 98.4 F (36.9 C)  TempSrc: Oral Oral Oral Oral  SpO2: 95% 93% 94% 91%  Weight:      Height:        Intake/Output Summary (Last 24 hours) at 04/11/2020 0719 Last data filed at 04/11/2020 0031 Gross per 24 hour  Intake 600 ml  Output 1100 ml  Net -500 ml   Filed Weights   04/08/20 2008  Weight: 78.5 kg    Examination:  General exam: Appears calm & comfortable  Respiratory system: clear breath sounds b/l. No wheezes, rales Cardiovascular system: S1 & S2 +. No rubs or gallops Gastrointestinal system: Abd is soft, obese, non-tender and hypoactive bowel sounds  Central nervous system: Alert and oriented. Moves all 4 extremities  Psychiatry: Judgement and insight appear abnormal. Flat mood and affect     Data Reviewed: I have personally reviewed following labs and imaging studies  CBC: Recent Labs  Lab 04/08/20 2013 04/09/20 5102 04/10/20 0559  04/11/20 0354  WBC 12.1* 8.5 8.4 7.4  HGB 12.2 11.3* 12.0 11.3*  HCT 36.2 33.9* 35.6* 32.4*  MCV 99.2 100.0 100.0 98.8  PLT 200 162 168 175*   Basic Metabolic Panel: Recent Labs  Lab 04/08/20 2013 04/09/20 0837 04/10/20 0559 04/11/20 0354  NA 136 135 138 136  K 4.0 3.7 3.8 3.2*  CL 97* 99 102 101  CO2 26 24 26 26   GLUCOSE 204* 177* 194* 185*  BUN 30* 27* 18 12  CREATININE 0.94 0.74 0.60 0.52  CALCIUM 9.5 8.6* 8.2* 7.9*   GFR: Estimated Creatinine Clearance: 46.3 mL/min (by C-G formula based on SCr of 0.52 mg/dL). Liver Function Tests: Recent Labs  Lab 04/08/20 2013  AST 33  ALT 11  ALKPHOS  69  BILITOT 1.1  PROT 8.0  ALBUMIN 3.7   Recent Labs  Lab 04/08/20 2013  LIPASE 21   No results for input(s): AMMONIA in the last 168 hours. Coagulation Profile: No results for input(s): INR, PROTIME in the last 168 hours. Cardiac Enzymes: No results for input(s): CKTOTAL, CKMB, CKMBINDEX, TROPONINI in the last 168 hours. BNP (last 3 results) No results for input(s): PROBNP in the last 8760 hours. HbA1C: No results for input(s): HGBA1C in the last 72 hours. CBG: No results for input(s): GLUCAP in the last 168 hours. Lipid Profile: No results for input(s): CHOL, HDL, LDLCALC, TRIG, CHOLHDL, LDLDIRECT in the last 72 hours. Thyroid Function Tests: No results for input(s): TSH, T4TOTAL, FREET4, T3FREE, THYROIDAB in the last 72 hours. Anemia Panel: Recent Labs    04/09/20 0837  VITAMINB12 1,396*   Sepsis Labs: Recent Labs  Lab 04/09/20 0250  LATICACIDVEN 1.7    Recent Results (from the past 240 hour(s))  Urine Culture     Status: Abnormal (Preliminary result)   Collection Time: 04/08/20  9:34 PM   Specimen: Urine, Random  Result Value Ref Range Status   Specimen Description   Final    URINE, RANDOM Performed at [facility], [address] [place], [place] [address]    Special Requests   Final    Normal Performed at [facility], [address] [place], [place] [address]    Culture (A)  Final    20,000 COLONIES/mL STAPHYLOCOCCUS HAEMOLYTICUS SUSCEPTIBILITIES TO FOLLOW Performed at [facility], [place] [address], [place], [place] [address]    Report Status PENDING  Incomplete  Resp Panel by RT-PCR (Flu A&B, Covid) Nasopharyngeal Swab     Status: None   Collection Time: 04/09/20  3:01 AM   Specimen: Nasopharyngeal Swab; Nasopharyngeal(NP) swabs in vial transport medium  Result Value Ref Range Status   SARS Coronavirus 2 by RT PCR NEGATIVE NEGATIVE Final    Comment: (NOTE) SARS-CoV-2 target nucleic acids are NOT DETECTED.  The  SARS-CoV-2 RNA is generally detectable in upper respiratory specimens during the acute phase of infection. The lowest concentration of SARS-CoV-2 viral copies this assay can detect is 138 copies/mL. A negative result does not preclude SARS-Cov-2 infection and should not be used as the sole basis for treatment or other patient management decisions. A negative result may occur with  improper specimen collection/handling, submission of specimen other than nasopharyngeal swab, presence of viral mutation(s) within the areas targeted by this assay, and inadequate number of viral copies(<138 copies/mL). A negative result must be combined with clinical observations, patient history, and epidemiological information. The expected result is Negative.  Fact Sheet for Patients:  EntrepreneurPulse.com.au  Fact Sheet for Healthcare  Providers:  IncredibleEmployment.be  This test is no t yet approved or cleared by the [place] and  has been authorized for detection and/or diagnosis of SARS-CoV-2 by FDA under an Emergency Use Authorization (EUA). This EUA will remain  in effect (meaning this test can be used) for the duration of the COVID-19 declaration under Section 564(b)(1) of the Act, 21 U.S.C.section 360bbb-3(b)(1), unless the authorization is terminated  or revoked sooner.       Influenza A by PCR NEGATIVE NEGATIVE Final   Influenza B by PCR NEGATIVE NEGATIVE Final    Comment: (NOTE) The Xpert Xpress SARS-CoV-2/FLU/RSV plus assay is intended as an aid in the diagnosis of influenza from Nasopharyngeal swab specimens and should not be used as a sole basis for treatment. Nasal washings and aspirates are unacceptable for Xpert Xpress SARS-CoV-2/FLU/RSV testing.  Fact Sheet for Patients: EntrepreneurPulse.com.au  Fact Sheet for Healthcare Providers: IncredibleEmployment.be  This test is not yet approved or  cleared by the [place] FDA and has been authorized for detection and/or diagnosis of SARS-CoV-2 by FDA under an Emergency Use Authorization (EUA). This EUA will remain in effect (meaning this test can be used) for the duration of the COVID-19 declaration under Section 564(b)(1) of the Act, 21 U.S.C. section 360bbb-3(b)(1), unless the authorization is terminated or revoked.  Performed at [facility], [address] [place], [place] [address]   C Difficile Quick Screen w PCR reflex     Status: None   Collection Time: 04/09/20  2:44 PM   Specimen: STOOL  Result Value Ref Range Status   C Diff antigen NEGATIVE NEGATIVE Final   C Diff toxin NEGATIVE NEGATIVE Final   C Diff interpretation No C. difficile detected.  Final    Comment: Performed at [facility], [address] [place], [place] [address]  Gastrointestinal Panel by PCR , Stool     Status: None   Collection Time: 04/09/20  2:44 PM   Specimen: Stool  Result Value Ref Range Status   Campylobacter species NOT DETECTED NOT DETECTED Final   Plesimonas shigelloides NOT DETECTED NOT DETECTED Final   Salmonella species NOT DETECTED NOT DETECTED Final   Yersinia enterocolitica NOT DETECTED NOT DETECTED Final   Vibrio species NOT DETECTED NOT DETECTED Final   Vibrio cholerae NOT DETECTED NOT DETECTED Final   Enteroaggregative E coli (EAEC) NOT DETECTED NOT DETECTED Final   Enteropathogenic E coli (EPEC) NOT DETECTED NOT DETECTED Final   Enterotoxigenic E coli (ETEC) NOT DETECTED NOT DETECTED Final   Shiga like toxin producing E coli (STEC) NOT DETECTED NOT DETECTED Final   Shigella/Enteroinvasive E coli (EIEC) NOT DETECTED NOT DETECTED Final   Cryptosporidium NOT DETECTED NOT DETECTED Final   Cyclospora cayetanensis NOT DETECTED NOT DETECTED Final   Entamoeba histolytica NOT DETECTED NOT DETECTED Final   Giardia lamblia NOT DETECTED NOT DETECTED Final   Adenovirus F40/41 NOT DETECTED NOT DETECTED Final    Astrovirus NOT DETECTED NOT DETECTED Final   Norovirus GI/GII NOT DETECTED NOT DETECTED Final   Rotavirus A NOT DETECTED NOT DETECTED Final   Sapovirus (I, II, IV, and V) NOT DETECTED NOT DETECTED Final    Comment: Performed at [facility], [address] [place], [place] [address]         Radiology Studies: MR LUMBAR SPINE WO CONTRAST  Result Date: 04/10/2020 CLINICAL DATA:  Recent onset of mid to low back pain. Worsening 2 weeks ago. EXAM: MRI LUMBAR SPINE WITHOUT CONTRAST TECHNIQUE: Multiplanar,  multisequence MR imaging of the lumbar spine was performed. No intravenous contrast was administered. COMPARISON:  CT 04/09/2020 FINDINGS: Segmentation:  5 lumbar type vertebral bodies Alignment:  Mild curvature convex to the left with the apex at L3. Vertebrae: Recent superior endplate fracture at T26 with loss of height only 20%. No retropulsion. This looks like a benign fracture. Conus medullaris and cauda equina: Conus extends to the L1 level. Conus and cauda equina appear normal. Paraspinal and other soft tissues: Negative Disc levels: No significant disc finding at T10-11, T11-12 or T12-L1. L1-2: Mild noncompressive disc bulge. L2-3: Endplate osteophytes and bulging of the disc more prominent towards the right. Facet and ligamentous hypertrophy more prominent on the right. Stenosis of the right lateral recess that could possibly affect the right L3 nerve. L3-4: Bulging of the disc slightly more prominent towards the right. Facet and ligamentous hypertrophy slightly more prominent on right. Mild stenosis of both lateral recesses but without definite neural compression. L4-5: Mild bulging of the disc. Minimal lateral recess narrowing without apparent compressive stenosis. L4-5: Mild bulging of the disc.  No canal or foraminal stenosis. IMPRESSION: 1. Recent superior endplate fracture at Z12 with loss of height of only 20%. No retropulsion. This looks like a benign fracture. 2. Scoliotic  curvature convex to the left. Chronic degenerative disc disease and degenerative facet disease as outlined above. Right lateral recess narrowing at L2-3 with some potential to affect the right L3 nerve in that location. Electronically Signed   By: Nelson Chimes M.D.   On: 04/10/2020 14:46        Scheduled Meds: . ascorbic acid  250 mg Oral Daily  . bisoprolol  5 mg Oral Daily  . furosemide  20 mg Oral BID  . levothyroxine  50 mcg Oral QAC breakfast  . losartan  12.5 mg Oral Daily  . multivitamin with minerals  1 tablet Oral Daily  . potassium chloride  40 mEq Oral Once  . psyllium  1 packet Oral Daily  . rivaroxaban  15 mg Oral Q supper   Continuous Infusions: . [START ON 04/12/2020] levofloxacin (LEVAQUIN) IV       LOS: 1 day    Time spent: 35 mins    Wyvonnia Dusky, MD [facility] Pager [phone]  If 7PM-7AM, please contact night-coverage 04/11/2020, 7:19 AM

## 2020-04-11 NOTE — Progress Notes (Signed)
PT Cancellation Note  Patient Details Name: Jodi Reeves MRN: [id number] DOB: 1927/05/11   Cancelled Treatment:    Reason Eval/Treat Not Completed: Medical issues which prohibited therapy.  Ortho is making decision with pt about a kyphoplasty, will wait for guidance after decision is made.   Ramond Dial 04/11/2020, 10:05 AM  Mee Hives, PT MS Acute Rehab Dept. Number: [place]

## 2020-04-12 LAB — CBC
HCT: 32.1 % — ABNORMAL LOW (ref 36.0–46.0)
Hemoglobin: 10.7 g/dL — ABNORMAL LOW (ref 12.0–15.0)
MCH: 33.3 pg (ref 26.0–34.0)
MCHC: 33.3 g/dL (ref 30.0–36.0)
MCV: 100 fL (ref 80.0–100.0)
Platelets: 148 10*3/uL — ABNORMAL LOW (ref 150–400)
RBC: 3.21 MIL/uL — ABNORMAL LOW (ref 3.87–5.11)
RDW: 13.2 % (ref 11.5–15.5)
WBC: 7 10*3/uL (ref 4.0–10.5)
nRBC: 0 % (ref 0.0–0.2)

## 2020-04-12 LAB — BASIC METABOLIC PANEL
Anion gap: 6 (ref 5–15)
BUN: 13 mg/dL (ref 8–23)
CO2: 28 mmol/L (ref 22–32)
Calcium: 7.9 mg/dL — ABNORMAL LOW (ref 8.9–10.3)
Chloride: 105 mmol/L (ref 98–111)
Creatinine, Ser: 0.61 mg/dL (ref 0.44–1.00)
GFR, Estimated: >60 mL/min (ref >60)
Glucose, Bld: 179 mg/dL — ABNORMAL HIGH (ref 70–99)
Potassium: 3.6 mmol/L (ref 3.5–5.1)
Sodium: 139 mmol/L (ref 135–145)

## 2020-04-12 MED ORDER — CLINDAMYCIN PHOSPHATE 600 MG/50ML IV SOLN
600.0000 mg | Freq: Once | INTRAVENOUS | Status: AC
  Administered 2020-04-13: 600 mg via INTRAVENOUS
  Filled 2020-04-12: qty 50

## 2020-04-12 NOTE — Anesthesia Preprocedure Evaluation (Addendum)
Anesthesia Evaluation  Patient identified by MRN, date of birth, ID band Patient awake    Reviewed: Allergy & Precautions, NPO status , Patient's Chart, lab work & pertinent test results  History of Anesthesia Complications Negative for: history of anesthetic complications  Airway Mallampati: III  TM Distance: >3 FB Neck ROM: Full    Dental  (+) Teeth Intact, Poor Dentition   Pulmonary neg pulmonary ROS, neg sleep apnea, neg COPD, Patient abstained from smoking.Not current smoker,    Pulmonary exam normal breath sounds clear to auscultation       Cardiovascular Exercise Tolerance: Poor METShypertension, + CAD, + Peripheral Vascular Disease and +CHF  (-) Past MI (-) dysrhythmias + Valvular Problems/Murmurs  Rhythm:Irregular Rate:Normal - Systolic murmurs    Neuro/Psych PSYCHIATRIC DISORDERS Anxiety TIA Neuromuscular disease    GI/Hepatic GERD  ,(+)     (-) substance abuse  ,   Endo/Other  diabetesHypothyroidism   Renal/GU negative Renal ROS     Musculoskeletal   Abdominal   Peds  Hematology   Anesthesia Other Findings Past Medical History: No date: Allergy No date: Anxiety No date: Balance disorder No date: Chronic diastolic CHF (congestive heart failure) (HCC) No date: Colon polyps No date: Coronary artery disease, non-occlusive     Comment:  a. LHC 11/2003: 10% LAD stenosis, 20% LCx stenosis, and               40% RCA stenosis; b. nuc stress test 12/15: small region               of mild ischemia in the apical region with WMA also noted              in the apical region, EF 60%. 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 CHADS2VASc = > 8 (CHF, HTN, age x 2, DM, TIA x 2,               female); b. on Xarelto No date: SBO (small bowel obstruction) (HCC)     Comment:  1998 No date: TIA (transient ischemic attack) No date: UTI (urinary tract infection) No date: Venous insufficiency  Reproductive/Obstetrics                             Anesthesia Physical  Anesthesia Plan  ASA: III  Anesthesia Plan: General   Post-op Pain Management:    Induction: Intravenous  PONV Risk Score and Plan: 3 and Ondansetron, Propofol infusion and   TIVA  Airway Management Planned: Nasal Cannula and Natural Airway  Additional Equipment: None  Intra-op Plan:   Post-operative Plan:   Informed Consent: I have reviewed the patients History and Physical, chart, labs and discussed the procedure including the risks, benefits and alternatives for the proposed anesthesia with the patient or authorized representative who has indicated his/her understanding and acceptance.     Dental advisory given  Plan Discussed with: CRNA and Surgeon  Anesthesia Plan Comments: (Discussed risks of anesthesia with patient, including possibility of difficulty with spontaneous ventilation under anesthesia necessitating airway intervention, PONV, and rare risks such as cardiac or respiratory or neurological events. Patient understands.)        Anesthesia Quick Evaluation                                   Anesthesia Evaluation  Patient identified by MRN, date of birth, ID band Patient awake    Reviewed: Allergy & Precautions, NPO status , Patient's Chart, lab work & pertinent test results  History of Anesthesia Complications Negative for: history of anesthetic complications  Airway Mallampati: III  TM Distance: >3 FB Neck ROM: Full    Dental   Bridges :   Pulmonary neg pulmonary ROS,    Pulmonary exam normal breath sounds clear to auscultation       Cardiovascular hypertension, + CAD and +CHF  + dysrhythmias (a fib on xarelto)  Rhythm:Irregular Rate:Normal  ECG 08/21/17: atrial fibrillation; left axis deviation; septal infarct, age undetermined  Myocardial perfusion 04/06/17:   Horizontal ST segment depression ST segment depression of 1 mm was noted during stress in the II, III and aVF leads.  The study is normal.  This is a low risk study.  The left ventricular ejection fraction is hyperdynamic (>65%).   Neuro/Psych PSYCHIATRIC DISORDERS Anxiety Depression TIAnegative neurological ROS     GI/Hepatic GERD  ,Esophageal web, hx SBO, IBS   Endo/Other  diabetes, Type 2Hypothyroidism   Renal/GU negative Renal ROS     Musculoskeletal   Abdominal   Peds  Hematology negative hematology ROS (+)   Anesthesia Other Findings Cardiology note 08/21/17:  ASSESSMENT AND PLAN:  Mixed hyperlipidemia  CT scan including mild coronary calcifications, aortic arch calcifications, moderate aortic atherosclerosis in the abdominal aorta  does not want a statin stable  No further work-up  Essential hypertension, benign - Plan: EKG 12-Lead Blood pressure is well controlled on today's visit. No changes made to the medications.  Persistent atrial fibrillation (HCC) Heart rate well controlled,  Tolerating Xarelto, no recent falls No changes made  Dark urine She is concerned about hematuria but no proof of this Recommended if she has any other days with very  dark urine that she contact primary care, may need urinalysis  if she does have confirmation of hematuria would likely benefit from urology but currently reports urine is back to normal color  Chronic diastolic CHF (congestive heart failure) (HCC) Takes Lasix daily, occasionally with extra dose potassium daily Appears euvolemic   Total encounter time more than 45 minutes  Greater than 50% was spent in counseling and coordination of care with the patient  Disposition:   F/U  12 months  Reproductive/Obstetrics                            Anesthesia Physical Anesthesia Plan  ASA: III  Anesthesia Plan: General and   Bier Block and Bier Block-LIDOCAINE ONLY   Post-op Pain Management:  Regional for Post-op pain and GA combined w/ Regional for post-op pain   Induction: Intravenous  PONV Risk Score and Plan: 3 and Propofol infusion and TIVA  Airway Management Planned: Natural Airway  Additional Equipment:   Intra-op Plan:   Post-operative Plan:   Informed Consent: I have reviewed the patients History and Physical, chart, labs and discussed the procedure including the risks, benefits and alternatives for the proposed anesthesia with the patient or authorized representative who has indicated his/her understanding and acceptance.     Plan Discussed with: CRNA  Anesthesia Plan Comments:         Anesthesia Quick Evaluation   

## 2020-04-12 NOTE — Progress Notes (Signed)
PROGRESS NOTE    Jodi Reeves  JJH:[id number] DOB: [birth year] DOA: 04/09/2020 PCP: [name], Nino Glow, MD   Assessment & Plan:   Principal Problem:   Abdominal pain Active Problems:   Hypothyroidism   Essential hypertension   Atrial fibrillation (HCC)   Gait instability   Diabetes, polyneuropathy (HCC)   Chronic diastolic CHF (congestive heart failure) (HCC)   Coronary artery disease, non-occlusive   Type 2 diabetes mellitus with diabetic polyneuropathy, without long-term current use of insulin (HCC)   PAD (peripheral artery disease) (HCC)   UTI (urinary tract infection)  Generalized weakness: PT/OT recs SNF   T12 compression fracture: acute vs subacute as per CT scan. Likely secondary to multiple falls. Possible kyphoplasty if pt agrees, as per ortho surg  Abdominal pain: likely secondary to constipation as per CT scan. No obstruction. Resolved  Possible peripheral neuropathy: not on any gabapentin or lyrica at home as per med rec  Hypokalemia: WNL today   Thrombocytopenia: etiology unclear, stable. Will continue to monitor   Leukocytosis: resolved  Hypothyroidism: continue on home dose of levothyroxine   HTN: continue on home dose of losartan, bisoprolol.   UTI: urine cx is growing stap haemolyticus. Continue on IV doxycycline  b Persistent atrial fibrillation: continue on bisoprolol. Hold xarelto for possible kyphoplasty tomorrow     DVT prophylaxis: xarelto Code Status: full Family Communication: discussed pt's care w/ pt's son, Tim, and answered his questions  Disposition Plan: likely d/c to SNF   Status is: Inpatient  Remains inpatient appropriate because:Ongoing diagnostic testing needed not appropriate for outpatient work up, Unsafe d/c plan and IV treatments appropriate due to intensity of illness or inability to take PO, possible kyphoplasty tomorrow    Dispo: The patient is from: Home              Anticipated d/c is to: SNF               Anticipated d/c date is: 3 days              Patient currently is not medically stable to d/c.          Consultants:      Procedures:   Antimicrobials: doxycycline   Subjective: Pt c/o back pain   Objective: Vitals:   04/11/20 1700 04/11/20 2145 04/11/20 2306 04/12/20 0433  BP: (!) 150/92 (!) 153/88 (!) 143/81 (!) 163/75  Pulse: 71 88 87 83  Resp:  18 18 18   Temp: 98.4 F (36.9 C) 98.3 F (36.8 C) 98.2 F (36.8 C) 98.3 F (36.8 C)  TempSrc: Oral Oral Oral Oral  SpO2: 93% 91% 94% 92%  Weight:      Height:        Intake/Output Summary (Last 24 hours) at 04/12/2020 0731 Last data filed at 04/12/2020 0022 Gross per 24 hour  Intake 930.52 ml  Output 1700 ml  Net -769.48 ml   Filed Weights   04/08/20 2008  Weight: 78.5 kg    Examination:  General exam: Appears calm but uncomfortable  Respiratory system: clear breath sounds b/l  Cardiovascular system: S1 & S2+. No clicks or rubs  Gastrointestinal system: Abd is soft, obese, non-tender and hypoactive bowel sounds  Central nervous system: Alert and oriented. Moves all 4 extremities  Psychiatry: Judgement and insight appear abnormal. Flat mood and affect     Data Reviewed: I have personally reviewed following labs and imaging studies  CBC: Recent Labs  Lab 04/08/20 2013 04/09/20 8185 04/10/20 0559  04/11/20 0354 04/12/20 0300  WBC 12.1* 8.5 8.4 7.4 7.0  HGB 12.2 11.3* 12.0 11.3* 10.7*  HCT 36.2 33.9* 35.6* 32.4* 32.1*  MCV 99.2 100.0 100.0 98.8 100.0  PLT 200 162 168 148* 419*   Basic Metabolic Panel: Recent Labs  Lab 04/08/20 2013 04/09/20 0837 04/10/20 0559 04/11/20 0354 04/12/20 0300  NA 136 135 138 136 139  K 4.0 3.7 3.8 3.2* 3.6  CL 97* 99 102 101 105  CO2 26 24 26 26 28   GLUCOSE 204* 177* 194* 185* 179*  BUN 30* 27* 18 12 13   CREATININE 0.94 0.74 0.60 0.52 0.61  CALCIUM 9.5 8.6* 8.2* 7.9* 7.9*   GFR: Estimated Creatinine Clearance: 46.3 mL/min (by C-G formula based on  SCr of 0.61 mg/dL). Liver Function Tests: Recent Labs  Lab 04/08/20 2013  AST 33  ALT 11  ALKPHOS 69  BILITOT 1.1  PROT 8.0  ALBUMIN 3.7   Recent Labs  Lab 04/08/20 2013  LIPASE 21   No results for input(s): AMMONIA in the last 168 hours. Coagulation Profile: No results for input(s): INR, PROTIME in the last 168 hours. Cardiac Enzymes: No results for input(s): CKTOTAL, CKMB, CKMBINDEX, TROPONINI in the last 168 hours. BNP (last 3 results) No results for input(s): PROBNP in the last 8760 hours. HbA1C: No results for input(s): HGBA1C in the last 72 hours. CBG: No results for input(s): GLUCAP in the last 168 hours. Lipid Profile: No results for input(s): CHOL, HDL, LDLCALC, TRIG, CHOLHDL, LDLDIRECT in the last 72 hours. Thyroid Function Tests: No results for input(s): TSH, T4TOTAL, FREET4, T3FREE, THYROIDAB in the last 72 hours. Anemia Panel: Recent Labs    04/09/20 0837  VITAMINB12 1,396*   Sepsis Labs: Recent Labs  Lab 04/09/20 0250  LATICACIDVEN 1.7    Recent Results (from the past 240 hour(s))  Urine Culture     Status: Abnormal   Collection Time: 04/08/20  9:34 PM   Specimen: Urine, Random  Result Value Ref Range Status   Specimen Description   Final    URINE, RANDOM Performed at [facility], [address] [place], [place] [address]    Special Requests   Final    Normal Performed at [facility], [place], [place] [address]    Culture 20,000 COLONIES/mL STAPHYLOCOCCUS HAEMOLYTICUS (A)  Final   Report Status 04/11/2020 FINAL  Final   Organism ID, Bacteria STAPHYLOCOCCUS HAEMOLYTICUS (A)  Final      Susceptibility   Staphylococcus haemolyticus - MIC*    CIPROFLOXACIN >=8 RESISTANT Resistant     GENTAMICIN <=0.5 SENSITIVE Sensitive     NITROFURANTOIN 32 SENSITIVE Sensitive     OXACILLIN >=4 RESISTANT Resistant     TETRACYCLINE <=1 SENSITIVE Sensitive     VANCOMYCIN <=0.5 SENSITIVE Sensitive     TRIMETH/SULFA  <=10 SENSITIVE Sensitive     CLINDAMYCIN RESISTANT Resistant     RIFAMPIN <=0.5 SENSITIVE Sensitive     Inducible Clindamycin POSITIVE Resistant     * 20,000 COLONIES/mL STAPHYLOCOCCUS HAEMOLYTICUS  Resp Panel by RT-PCR (Flu A&B, Covid) Nasopharyngeal Swab     Status: None   Collection Time: 04/09/20  3:01 AM   Specimen: Nasopharyngeal Swab; Nasopharyngeal(NP) swabs in vial transport medium  Result Value Ref Range Status   SARS Coronavirus 2 by RT PCR NEGATIVE NEGATIVE Final    Comment: (NOTE) SARS-CoV-2 target nucleic acids are NOT DETECTED.  The SARS-CoV-2 RNA is generally detectable in upper respiratory specimens during the acute phase of  infection. The lowest concentration of SARS-CoV-2 viral copies this assay can detect is 138 copies/mL. A negative result does not preclude SARS-Cov-2 infection and should not be used as the sole basis for treatment or other patient management decisions. A negative result may occur with  improper specimen collection/handling, submission of specimen other than nasopharyngeal swab, presence of viral mutation(s) within the areas targeted by this assay, and inadequate number of viral copies(<138 copies/mL). A negative result must be combined with clinical observations, patient history, and epidemiological information. The expected result is Negative.  Fact Sheet for Patients:  EntrepreneurPulse.com.au  Fact Sheet for Healthcare Providers:  IncredibleEmployment.be  This test is no t yet approved or cleared by the [place] FDA and  has been authorized for detection and/or diagnosis of SARS-CoV-2 by FDA under an Emergency Use Authorization (EUA). This EUA will remain  in effect (meaning this test can be used) for the duration of the COVID-19 declaration under Section 564(b)(1) of the Act, 21 U.S.C.section 360bbb-3(b)(1), unless the authorization is terminated  or revoked sooner.       Influenza A by PCR  NEGATIVE NEGATIVE Final   Influenza B by PCR NEGATIVE NEGATIVE Final    Comment: (NOTE) The Xpert Xpress SARS-CoV-2/FLU/RSV plus assay is intended as an aid in the diagnosis of influenza from Nasopharyngeal swab specimens and should not be used as a sole basis for treatment. Nasal washings and aspirates are unacceptable for Xpert Xpress SARS-CoV-2/FLU/RSV testing.  Fact Sheet for Patients: EntrepreneurPulse.com.au  Fact Sheet for Healthcare Providers: IncredibleEmployment.be  This test is not yet approved or cleared by the [place] FDA and has been authorized for detection and/or diagnosis of SARS-CoV-2 by FDA under an Emergency Use Authorization (EUA). This EUA will remain in effect (meaning this test can be used) for the duration of the COVID-19 declaration under Section 564(b)(1) of the Act, 21 U.S.C. section 360bbb-3(b)(1), unless the authorization is terminated or revoked.  Performed at [facility], [address] [place], [place] [address]   C Difficile Quick Screen w PCR reflex     Status: None   Collection Time: 04/09/20  2:44 PM   Specimen: STOOL  Result Value Ref Range Status   C Diff antigen NEGATIVE NEGATIVE Final   C Diff toxin NEGATIVE NEGATIVE Final   C Diff interpretation No C. difficile detected.  Final    Comment: Performed at [facility], [address] [place], [place] [address]  Gastrointestinal Panel by PCR , Stool     Status: None   Collection Time: 04/09/20  2:44 PM   Specimen: Stool  Result Value Ref Range Status   Campylobacter species NOT DETECTED NOT DETECTED Final   Plesimonas shigelloides NOT DETECTED NOT DETECTED Final   Salmonella species NOT DETECTED NOT DETECTED Final   Yersinia enterocolitica NOT DETECTED NOT DETECTED Final   Vibrio species NOT DETECTED NOT DETECTED Final   Vibrio cholerae NOT DETECTED NOT DETECTED Final   Enteroaggregative E coli (EAEC) NOT DETECTED NOT  DETECTED Final   Enteropathogenic E coli (EPEC) NOT DETECTED NOT DETECTED Final   Enterotoxigenic E coli (ETEC) NOT DETECTED NOT DETECTED Final   Shiga like toxin producing E coli (STEC) NOT DETECTED NOT DETECTED Final   Shigella/Enteroinvasive E coli (EIEC) NOT DETECTED NOT DETECTED Final   Cryptosporidium NOT DETECTED NOT DETECTED Final   Cyclospora cayetanensis NOT DETECTED NOT DETECTED Final   Entamoeba histolytica NOT DETECTED NOT DETECTED Final   Giardia lamblia NOT DETECTED NOT DETECTED Final  Adenovirus F40/41 NOT DETECTED NOT DETECTED Final   Astrovirus NOT DETECTED NOT DETECTED Final   Norovirus GI/GII NOT DETECTED NOT DETECTED Final   Rotavirus A NOT DETECTED NOT DETECTED Final   Sapovirus (I, II, IV, and V) NOT DETECTED NOT DETECTED Final    Comment: Performed at [facility], [address], [place], [place] [address]         Radiology Studies: MR LUMBAR SPINE WO CONTRAST  Result Date: 04/10/2020 CLINICAL DATA:  Recent onset of mid to low back pain. Worsening 2 weeks ago. EXAM: MRI LUMBAR SPINE WITHOUT CONTRAST TECHNIQUE: Multiplanar, multisequence MR imaging of the lumbar spine was performed. No intravenous contrast was administered. COMPARISON:  CT 04/09/2020 FINDINGS: Segmentation:  5 lumbar type vertebral bodies Alignment:  Mild curvature convex to the left with the apex at L3. Vertebrae: Recent superior endplate fracture at E36 with loss of height only 20%. No retropulsion. This looks like a benign fracture. Conus medullaris and cauda equina: Conus extends to the L1 level. Conus and cauda equina appear normal. Paraspinal and other soft tissues: Negative Disc levels: No significant disc finding at T10-11, T11-12 or T12-L1. L1-2: Mild noncompressive disc bulge. L2-3: Endplate osteophytes and bulging of the disc more prominent towards the right. Facet and ligamentous hypertrophy more prominent on the right. Stenosis of the right lateral recess that could possibly  affect the right L3 nerve. L3-4: Bulging of the disc slightly more prominent towards the right. Facet and ligamentous hypertrophy slightly more prominent on right. Mild stenosis of both lateral recesses but without definite neural compression. L4-5: Mild bulging of the disc. Minimal lateral recess narrowing without apparent compressive stenosis. L4-5: Mild bulging of the disc.  No canal or foraminal stenosis. IMPRESSION: 1. Recent superior endplate fracture at O29 with loss of height of only 20%. No retropulsion. This looks like a benign fracture. 2. Scoliotic curvature convex to the left. Chronic degenerative disc disease and degenerative facet disease as outlined above. Right lateral recess narrowing at L2-3 with some potential to affect the right L3 nerve in that location. Electronically Signed   By: Nelson Chimes M.D.   On: 04/10/2020 14:46        Scheduled Meds: . ascorbic acid  250 mg Oral Daily  . bisoprolol  5 mg Oral Daily  . furosemide  20 mg Oral BID  . levothyroxine  50 mcg Oral QAC breakfast  . losartan  12.5 mg Oral Daily  . multivitamin with minerals  1 tablet Oral Daily  . psyllium  1 packet Oral Daily   Continuous Infusions: . sodium chloride Stopped (04/11/20 2343)  . doxycycline (VIBRAMYCIN) IV 125 mL/hr at 04/12/20 0022     LOS: 2 days    Time spent: 31 mins    Wyvonnia Dusky, MD [facility] Pager [phone]  If 7PM-7AM, please contact night-coverage 04/12/2020, 7:31 AM

## 2020-04-12 NOTE — Progress Notes (Signed)
Patient uncertain about surgery, will discuss with son later today and schedule for tomorrow.

## 2020-04-12 NOTE — Evaluation (Signed)
Physical Therapy Evaluation Patient Details Name: Jodi Reeves MRN: [id number] DOB: 11/13/1927 Today's Date: 04/12/2020   History of Present Illness  Jodi Reeves is a 74yoF who comes to [facility] on 12/16 10 days of viral respiratory symptoms including sore throat, congestion, and some shortness of breath with exertion and mild cough. She also has had about 2 days of intermittent sharp and severe lower abdominal pain that is worse on the left, worse with ambulation. Imaging revealing of acute to subacute T12 compression fracture. PMH: hypoTSH, HTN, AF, gait unsteadiness, DM2, PND, CAD, PAD, UTI.  Clinical Impression  Pt admitted with above diagnosis. Pt currently with functional limitations due to the deficits listed below (see "PT Problem List"). Upon entry, pt in bed, awake and agreeable to participate. The pt is alert, pleasant, interactive, and able to provide info regarding prior level of function, both in tolerance and independence. Pain remains a dominant figure in the room, however pt more recently willing to attempt stronger medications which have provided some additional relief without any obvious consequences. Pt struggles with establishing balance in standing. Pt requires physical assistance or elevated surface height to rise to standing. Patient's performance this date reveals decreased ability, independence, and tolerance in performing all basic mobility required for performance of activities of daily living. Pt requires additional DME, close physical assistance, and cues for safe participate in mobility. Pt will benefit from skilled PT intervention to increase independence and safety with basic mobility in preparation for discharge to the venue listed below.       Follow Up Recommendations SNF;Supervision for mobility/OOB;Supervision - Intermittent    Equipment Recommendations  None recommended by PT (may need a WC if DC to home (will defer to receiving facility)    Recommendations  for Other Services       Precautions / Restrictions Precautions Precautions: Fall      Mobility  Bed Mobility Overal bed mobility: Needs Assistance Bed Mobility: Supine to Sit     Supine to sit: Supervision;HOB elevated     General bed mobility comments: 2-3 minutes to perform clear exacerbation of pain    Transfers Overall transfer level: Needs assistance Equipment used: Rolling walker (2 wheeled) Transfers: Sit to/from [employer] Transfers Sit to Stand: From elevated surface;Min assist Stand pivot transfers: Min guard;From elevated surface       General transfer comment: performed 3-4x in session, requires 30-60sec for trunk lean correction, to establish balance  Ambulation/Gait Ambulation/Gait assistance:  (unable at this time due to anxiety, imbalance, and pain levels)              Stairs            Wheelchair Mobility    Modified Rankin (Stroke Patients Only)       Balance Overall balance assessment: Needs assistance Sitting-balance support: No upper extremity supported;Feet supported;Feet unsupported Sitting balance-Leahy Scale: Good     Standing balance support: Bilateral upper extremity supported;During functional activity Standing balance-Leahy Scale: Poor Standing balance comment: persisten posterior lean                             Pertinent Vitals/Pain Pain Assessment: 0-10 Pain Score: 5  (while mobilizing in session) Pain Location: low back and ABD Pain Descriptors / Indicators: Aching;Sharp Pain Intervention(s): Limited activity within patient's tolerance;Monitored during session;Premedicated before session;Repositioned    Home Living Family/patient expects to be discharged to:: Private residence Living Arrangements: Alone   Type of Home:  House Home Access: Stairs to enter Entrance Stairs-Rails: Can reach both Entrance Stairs-Number of Steps: 5 Home Layout: Two level;Able to live on main level with  bedroom/bathroom Home Equipment: Gilford Rile - 2 wheels;Cane - single point;Shower seat - built in Additional Comments: Reports 1 son nearby but works full time and travels for work    Prior Function Level of Independence: Independent with assistive device(s)   Gait / Transfers Assistance Needed: limits AMB to household due to balance issues. Uxses RW or cane or walls in home.  ADL's / Homemaking Assistance Needed: Reports is scared of shower so sponge bathes mostly. Does not drive but if taken to grocery store uses cart to ambulate  Comments: Pt is retired Designer, jewellery. 2 falls with fractures previously, no dfalls or stumbles in 12 months.     Hand Dominance   Dominant Hand: Right    Extremity/Trunk Assessment   Upper Extremity Assessment Upper Extremity Assessment: Overall WFL for tasks assessed;Generalized weakness    Lower Extremity Assessment Lower Extremity Assessment: Overall WFL for tasks assessed;Generalized weakness       Communication      Cognition Arousal/Alertness: Awake/alert Behavior During Therapy: WFL for tasks assessed/performed Overall Cognitive Status: Within Functional Limits for tasks assessed                                        General Comments      Exercises     Assessment/Plan    PT Assessment Patient needs continued PT services  PT Problem List Decreased strength;Decreased activity tolerance;Decreased mobility;Decreased balance       PT Treatment Interventions DME instruction;Gait training;Balance training;Functional mobility training;Therapeutic activities;Patient/family education;Therapeutic exercise    PT Goals (Current goals can be found in the Care Plan section)  Acute Rehab PT Goals Patient Stated Goal: To return home safely PT Goal Formulation: With patient Time For Goal Achievement: 04/26/20 Potential to Achieve Goals: Poor    Frequency 7X/week   Barriers to discharge Inaccessible home  environment;Decreased caregiver support stairs at entry, lives alone, family can only help intermittently    Co-evaluation               AM-PAC PT "6 Clicks" Mobility  Outcome Measure Help needed turning from your back to your side while in a flat bed without using bedrails?: A Little Help needed moving from lying on your back to sitting on the side of a flat bed without using bedrails?: A Little Help needed moving to and from a bed to a chair (including a wheelchair)?: A Lot Help needed standing up from a chair using your arms (e.g., wheelchair or bedside chair)?: A Lot Help needed to walk in hospital room?: A Lot Help needed climbing 3-5 steps with a railing? : Total 6 Click Score: 13    End of Session Equipment Utilized During Treatment: Gait belt Activity Tolerance: Patient tolerated treatment well;Patient limited by pain Patient left: in chair;with call bell/phone within reach Nurse Communication: Mobility status (check on patient in 10 minutes to assist with legs) PT Visit Diagnosis: Difficulty in walking, not elsewhere classified (R26.2);Other abnormalities of gait and mobility (R26.89)    Time: 6440-3474 PT Time Calculation (min) (ACUTE ONLY): 38 min   Charges:   PT Evaluation $PT Eval Moderate Complexity: 1 Mod PT Treatments $Therapeutic Exercise: 8-22 mins        3:55 PM, 04/12/20 Etta Grandchild, PT, DPT  Physical Therapist - [facility]  [phone] ([place])    Jodi Reeves 04/12/2020, 3:50 PM

## 2020-04-13 ENCOUNTER — Inpatient Hospital Stay: Payer: Medicare HMO | Admitting: Anesthesiology

## 2020-04-13 ENCOUNTER — Inpatient Hospital Stay: Payer: Medicare HMO

## 2020-04-13 ENCOUNTER — Encounter
Admission: EM | Disposition: A | Discharge status: Skilled Nursing Facility | Payer: Medicare HMO | Source: Home / Self Care | Attending: Internal Medicine

## 2020-04-13 HISTORY — PX: KYPHOPLASTY: SHX5884

## 2020-04-13 LAB — BASIC METABOLIC PANEL
Anion gap: 9 (ref 5–15)
BUN: 12 mg/dL (ref 8–23)
CO2: 27 mmol/L (ref 22–32)
Calcium: 8.1 mg/dL — ABNORMAL LOW (ref 8.9–10.3)
Chloride: 102 mmol/L (ref 98–111)
Creatinine, Ser: 0.5 mg/dL (ref 0.44–1.00)
GFR, Estimated: >60 mL/min (ref >60)
Glucose, Bld: 182 mg/dL — ABNORMAL HIGH (ref 70–99)
Potassium: 3.4 mmol/L — ABNORMAL LOW (ref 3.5–5.1)
Sodium: 138 mmol/L (ref 135–145)

## 2020-04-13 LAB — CBC
HCT: 32.1 % — ABNORMAL LOW (ref 36.0–46.0)
Hemoglobin: 10.8 g/dL — ABNORMAL LOW (ref 12.0–15.0)
MCH: 33.6 pg (ref 26.0–34.0)
MCHC: 33.6 g/dL (ref 30.0–36.0)
MCV: 100 fL (ref 80.0–100.0)
Platelets: 148 10*3/uL — ABNORMAL LOW (ref 150–400)
RBC: 3.21 MIL/uL — ABNORMAL LOW (ref 3.87–5.11)
RDW: 13.1 % (ref 11.5–15.5)
WBC: 7.4 10*3/uL (ref 4.0–10.5)
nRBC: 0 % (ref 0.0–0.2)

## 2020-04-13 LAB — GLUCOSE, CAPILLARY: Glucose-Capillary: 169 mg/dL — ABNORMAL HIGH (ref 70–99)

## 2020-04-13 SURGERY — KYPHOPLASTY
Anesthesia: General

## 2020-04-13 MED ORDER — METOCLOPRAMIDE HCL 5 MG/ML IJ SOLN: 5.0000 mg | Freq: Three times a day (TID) | INTRAMUSCULAR | Status: DC | PRN

## 2020-04-13 MED ORDER — POTASSIUM CHLORIDE CRYS ER 20 MEQ PO TBCR
20.0000 meq | EXTENDED_RELEASE_TABLET | Freq: Once | ORAL | Status: AC
  Administered 2020-04-13: 20 meq via ORAL
  Filled 2020-04-13: qty 1

## 2020-04-13 MED ORDER — CLINDAMYCIN PHOSPHATE 600 MG/50ML IV SOLN
INTRAVENOUS | Status: AC
  Filled 2020-04-13: qty 50

## 2020-04-13 MED ORDER — ACETAMINOPHEN 650 MG RE SUPP
650.0000 mg | Freq: Four times a day (QID) | RECTAL | Status: DC | PRN
  Filled 2020-04-13: qty 1

## 2020-04-13 MED ORDER — LIDOCAINE HCL (CARDIAC) PF 100 MG/5ML IV SOSY
PREFILLED_SYRINGE | INTRAVENOUS | Status: DC | PRN
  Administered 2020-04-13: 60 mg via INTRATRACHEAL

## 2020-04-13 MED ORDER — BUPIVACAINE-EPINEPHRINE (PF) 0.5% -1:200000 IJ SOLN
INTRAMUSCULAR | Status: DC | PRN
  Administered 2020-04-13: 20 mL

## 2020-04-13 MED ORDER — LIDOCAINE HCL 1 % IJ SOLN
INTRAMUSCULAR | Status: DC | PRN
  Administered 2020-04-13: 30 mL via INTRADERMAL

## 2020-04-13 MED ORDER — METOCLOPRAMIDE HCL 5 MG PO TABS
5.0000 mg | ORAL_TABLET | Freq: Three times a day (TID) | ORAL | Status: DC | PRN
Start: 2020-04-13 — End: 2020-04-20

## 2020-04-13 MED ORDER — PROPOFOL 10 MG/ML IV BOLUS
INTRAVENOUS | Status: AC
  Filled 2020-04-13: qty 20

## 2020-04-13 MED ORDER — DEXMEDETOMIDINE (PRECEDEX) IN NS 20 MCG/5ML (4 MCG/ML) IV SYRINGE
PREFILLED_SYRINGE | INTRAVENOUS | Status: AC
  Filled 2020-04-13: qty 5

## 2020-04-13 MED ORDER — PROPOFOL 500 MG/50ML IV EMUL
INTRAVENOUS | Status: DC | PRN
  Administered 2020-04-13: 25 ug/kg/min via INTRAVENOUS

## 2020-04-13 MED ORDER — RIVAROXABAN 20 MG PO TABS
20.0000 mg | ORAL_TABLET | Freq: Every day | ORAL | Status: DC
  Administered 2020-04-14 – 2020-04-19 (×5): 20 mg via ORAL
  Filled 2020-04-13 (×7): qty 1

## 2020-04-13 MED ORDER — FENTANYL CITRATE (PF) 100 MCG/2ML IJ SOLN
INTRAMUSCULAR | Status: DC | PRN
  Administered 2020-04-13 (×3): 25 ug via INTRAVENOUS

## 2020-04-13 MED ORDER — DEXMEDETOMIDINE (PRECEDEX) IN NS 20 MCG/5ML (4 MCG/ML) IV SYRINGE
PREFILLED_SYRINGE | INTRAVENOUS | Status: DC | PRN
  Administered 2020-04-13 (×2): 4 ug via INTRAVENOUS

## 2020-04-13 MED ORDER — DOCUSATE SODIUM 100 MG PO CAPS
100.0000 mg | ORAL_CAPSULE | Freq: Two times a day (BID) | ORAL | Status: DC
  Administered 2020-04-13 – 2020-04-14 (×2): 100 mg via ORAL
  Filled 2020-04-13 (×2): qty 1

## 2020-04-13 MED ORDER — CLINDAMYCIN PHOSPHATE 600 MG/50ML IV SOLN
600.0000 mg | Freq: Four times a day (QID) | INTRAVENOUS | Status: AC
  Administered 2020-04-14 (×2): 600 mg via INTRAVENOUS
  Filled 2020-04-13 (×2): qty 50

## 2020-04-13 MED ORDER — FENTANYL CITRATE (PF) 100 MCG/2ML IJ SOLN
INTRAMUSCULAR | Status: AC
  Filled 2020-04-13: qty 2

## 2020-04-13 SURGICAL SUPPLY — 23 items
ADH SKN CLS APL DERMABOND .7 (GAUZE/BANDAGES/DRESSINGS) ×1
CEMENT KYPHON CX01A KIT/MIXER (Cement) ×3 IMPLANT
COVER WAND RF STERILE (DRAPES) ×2 IMPLANT
DERMABOND ADVANCED (GAUZE/BANDAGES/DRESSINGS) ×1
DERMABOND ADVANCED .7 DNX12 (GAUZE/BANDAGES/DRESSINGS) ×1 IMPLANT
DEVICE BIOPSY BONE KYPH (INSTRUMENTS) ×2 IMPLANT
DEVICE BIOPSY BONE KYPHX (INSTRUMENTS) ×2 IMPLANT
DRAPE C-ARM XRAY 36X54 (DRAPES) ×2 IMPLANT
DURAPREP 26ML APPLICATOR (WOUND CARE) ×2 IMPLANT
GLOVE SURG SYN 9.0  PF PI (GLOVE) ×2
GLOVE SURG SYN 9.0 PF PI (GLOVE) ×1 IMPLANT
GOWN SRG 2XL LVL 4 RGLN SLV (GOWNS) ×1 IMPLANT
GOWN STRL NON-REIN 2XL LVL4 (GOWNS) ×2
GOWN STRL REUS W/ TWL LRG LVL3 (GOWN DISPOSABLE) ×1 IMPLANT
GOWN STRL REUS W/TWL LRG LVL3 (GOWN DISPOSABLE) ×2
MANIFOLD NEPTUNE II (INSTRUMENTS) ×2 IMPLANT
PACK KYPHOPLASTY (MISCELLANEOUS) ×2 IMPLANT
RENTAL RFA GENERATOR (MISCELLANEOUS) IMPLANT
STRAP SAFETY 5IN WIDE (MISCELLANEOUS) ×2 IMPLANT
SWABSTK COMLB BENZOIN TINCTURE (MISCELLANEOUS) ×2 IMPLANT
TRAY KYPHOPAK 15/2 EXPRESS (KITS) ×1 IMPLANT
TRAY KYPHOPAK 15/3 EXPRESS 1ST (MISCELLANEOUS) ×2 IMPLANT
TRAY KYPHOPAK 20/3 EXPRESS 1ST (MISCELLANEOUS) ×2 IMPLANT

## 2020-04-13 NOTE — Progress Notes (Signed)
PT Cancellation Note  Patient Details Name: SWEET JARVIS MRN: [id number] DOB: 09/14/1927   Cancelled Treatment:    Reason Eval/Treat Not Completed: Pain limiting ability to participate;Medical issues which prohibited therapy (Chart reviewed, RN consulted. Pt having more pain this date, still waiting on pain meds due to NPO status. Pt scheduled to go for kyphoplasty today. Will hold treatent at this time, resume after kyphoplasty once orders are updated.)   Melvine Julin C 04/13/2020, 10:50 AM

## 2020-04-13 NOTE — Anesthesia Procedure Notes (Signed)
Procedure Name: MAC Date/Time: 04/13/2020 6:50 PM Performed by: Jerrye Noble, CRNA Pre-anesthesia Checklist: Patient identified, Emergency Drugs available, Suction available and Patient being monitored Patient Re-evaluated:Patient Re-evaluated prior to induction Oxygen Delivery Method: Nasal cannula

## 2020-04-13 NOTE — Op Note (Signed)
04/13/2020  7:34 PM  PATIENT:  Jodi Reeves   MRN: [id number]   PRE-OPERATIVE DIAGNOSIS:  closed wedge compression fracture of T 12   POST-OPERATIVE DIAGNOSIS:  closed wedge compression fracture of T 12   PROCEDURE:  Procedure(s): KYPHOPLASTY T 12  SURGEON: Laurene Footman, MD   ASSISTANTS: None   ANESTHESIA:   local and MAC   EBL:  No intake/output data recorded.   BLOOD ADMINISTERED:none   DRAINS: none    LOCAL MEDICATIONS USED:  MARCAINE    and XYLOCAINE    SPECIMEN:  none   DISPOSITION OF SPECIMEN: none   COUNTS:  YES   TOURNIQUET:  * No tourniquets in log *   IMPLANTS: Bone cement   DICTATION: .Dragon Dictation  patient was brought to the operating room and after adequate anesthesia was obtained the patient was placed prone.  C arm was brought in in good visualization of the affected level obtained on both AP and lateral projections.  After patient identification and timeout procedures were completed, local anesthetic was infiltrated with 10 cc 1% Xylocaine infiltrated subcutaneously.  This is done the area on the each side of the planned approach.  The back was then prepped and draped in the usual sterile manner and repeat timeout procedure carried out.  A spinal needle was brought down to the pedicle on the each side of T 12 and a 50-50 mix of 1% Xylocaine half percent Sensorcaine with epinephrine total of 20 cc injected on each side.  After allowing this to set a small incision was made and the trocar was advanced into the vertebral body in an extrapedicular fashion.  Biopsy was no obtained despite trying on each side  Drilling was carried out balloon inserted with inflation to 3 cc on the right and 3 cc on the left.  When the cement was appropriate consistency 4 cc were injected on the right and 4 cc on the left into the vertebral body without extravasation, good fill superior to inferior endplates and from right to left sides along the inferior endplate.  After the  cement had set the trochar was removed and permanent C-arm views obtained.  The wound was closed with Dermabond followed by [place]: continue as inpatient   PATIENT DISPOSITION:  PACU - hemodynamically stable.

## 2020-04-13 NOTE — NC FL2 (Signed)
[place] LEVEL OF CARE SCREENING TOOL     IDENTIFICATION  Patient Name: Jodi Reeves Birthdate: [birth year] Sex: female Admission Date (Current Location): 04/09/2020  [facility] and [place] Number:  Engineering geologist and Address:  [facility], [address] [place], [place] [address]      Provider Number: [id number]  Attending Physician Name and Address:  Wyvonnia Dusky, MD  Relative Name and Phone Number:       Current Level of Care: Hospital Recommended Level of Care: [place] Prior Approval Number:    Date Approved/Denied:   PASRR Number: [id number]  Discharge Plan: SNF    Current Diagnoses: Patient Active Problem List   Diagnosis Date Noted   UTI (urinary tract infection) 04/10/2020   Abdominal pain 04/09/2020   Hypertension associated with diabetes ([place]) 03/15/2020   Obesity (BMI 30-39.9) 03/15/2020   Coronary artery disease involving native coronary artery of native heart without angina pectoris 03/15/2020   PAD (peripheral artery disease) ([place]) 08/14/2019   Foot ulcer ([place]) 06/19/2019   Lymphedema 06/09/2019   Chronic venous insufficiency 06/09/2019   Diabetic retinopathy associated with type 2 diabetes mellitus ([place]) 01/20/2019   Low back pain 09/24/2018   Overactive bladder 04/10/2018   History of UTI 04/10/2018   Hematuria 04/10/2018   Diarrhea 04/10/2018   Venous ulcer of leg ([place]) 04/10/2018   Status post endoscopic carpal tunnel release 03/19/2018   History of skin cancer 11/08/2017   Chronic anticoagulation 06/11/2017   Aortic atherosclerosis ([place]) 05/08/2017   Coronary artery disease, non-occlusive 03/29/2017   Persistent atrial fibrillation ([place]) 03/29/2017   Carpal tunnel syndrome 03/28/2017   Cervical radiculitis 03/28/2017   Problems with swallowing and mastication    Esophageal candidiasis ([place])    Stricture and stenosis of esophagus 05/02/2016    TIA (transient ischemic attack) 06/24/2015   Status post reverse total shoulder replacement, left 10/16/2014   Humeral head fracture 10/06/2014   Chest pain 03/27/2014   Shortness of breath 03/27/2014   Chronic diastolic CHF (congestive heart failure) ([place]) 03/27/2014   Advanced directives, counseling/discussion 02/24/2014   Routine general medical examination at a health care facility 02/17/2013   Diabetes, polyneuropathy ([place]) 02/17/2013   Type 2 diabetes mellitus with diabetic polyneuropathy, without long-term current use of insulin ([place]) 02/17/2013   Gait instability 04/11/2012   Edema 11/14/2010   OTHER CONSTIPATION 05/31/2007   VENTRAL HERNIA 03/15/2007   Ovarian cancer ([place]) 11/20/2006   Hypothyroidism 11/20/2006   Type 2 diabetes mellitus with neurological manifestations, controlled ([place]) 11/20/2006   Essential hypertension 11/20/2006   Atrial fibrillation ([place]) 11/20/2006   Osteoporosis 11/20/2006   Hyperlipidemia due to type 2 diabetes mellitus ([place]) 11/20/2006    Orientation RESPIRATION BLADDER Height & Weight     Self,Time,Situation,Place  Normal Continent,External catheter Weight: 173 lb 1 oz (78.5 kg) Height:  5\' 3"  (160 cm)  BEHAVIORAL SYMPTOMS/MOOD NEUROLOGICAL BOWEL NUTRITION STATUS   (None)  (None) Continent Diet (Follow for discharge recommendations. Currently NPO for procedure.)  AMBULATORY STATUS COMMUNICATION OF NEEDS Skin   Extensive Assist Verbally Bruising                       Personal Care Assistance Level of Assistance  Bathing,Feeding,Dressing Bathing Assistance: Maximum assistance Feeding assistance: Limited assistance Dressing Assistance: Maximum assistance     Functional Limitations Info  Sight,Hearing,Speech Sight Info: Adequate Hearing Info: Adequate Speech Info: Adequate    SPECIAL CARE FACTORS FREQUENCY  PT (  By licensed PT),OT (By licensed OT)     PT Frequency: 5 x week OT Frequency: 5 x week             Contractures Contractures Info: Not present    Additional Factors Info  Code Status,Allergies Code Status Info: Full code Allergies Info: Amiodarone, Penicillins, Actos (Pioglitazone), Amaryl (Glimepiride), Atorvastatin, Bentyl (Dicyclomine Hcl), Ciprofloxacin, Codeine, Ezetimibe-simvastatin, Fosamax (Alendronate), Lovastatin, Macrobid (Nitrofurantoin Macrocrystal), Protonix (Pantoprazole Sodium), Rosiglitazone Maleate, Statins, Victoza (Liraglutide), Alendronate Sodium, Bactrim (Sulfamethoxazole-trimethoprim), Sulfa Antibiotics           Current Medications (04/13/2020):  This is the current hospital active medication list Current Facility-Administered Medications  Medication Dose Route Frequency Provider Last Rate Last Admin   0.9 %  sodium chloride infusion   Intravenous PRN Wyvonnia Dusky, MD   Stopped at 04/12/20 0443   acetaminophen (TYLENOL) tablet 650 mg  650 mg Oral Q6H PRN Wyvonnia Dusky, MD   650 mg at 04/12/20 1619   ascorbic acid (VITAMIN C) tablet 250 mg  250 mg Oral Daily Cox, Amy N, DO   250 mg at 04/12/20 1043   bisacodyl (DULCOLAX) EC tablet 10 mg  10 mg Oral Daily PRN Wyvonnia Dusky, MD       bisoprolol (ZEBETA) tablet 5 mg  5 mg Oral Daily Cox, Amy N, DO   5 mg at 04/12/20 1044   clindamycin (CLEOCIN) IVPB 600 mg  600 mg Intravenous Once Hessie Knows, MD       docusate sodium (COLACE) capsule 200 mg  200 mg Oral BID PRN Wyvonnia Dusky, MD       doxycycline (VIBRAMYCIN) 100 mg in sodium chloride 0.9 % 250 mL IVPB  100 mg Intravenous Q12H Wyvonnia Dusky, MD   Stopped at 04/13/20 2836   furosemide (LASIX) tablet 20 mg  20 mg Oral BID Cox, Amy N, DO   20 mg at 04/12/20 6294   levothyroxine (SYNTHROID) tablet 50 mcg  50 mcg Oral QAC breakfast Cox, Amy N, DO   50 mcg at 04/12/20 0447   losartan (COZAAR) tablet 12.5 mg  12.5 mg Oral Daily Cox, Amy N, DO   12.5 mg at 04/13/20 0900   morphine 2 MG/ML injection 2 mg  2 mg Intravenous  Q1H PRN Cox, Amy N, DO   2 mg at 04/12/20 7654   multivitamin with minerals tablet 1 tablet  1 tablet Oral Daily Cox, Amy N, DO   1 tablet at 04/12/20 1044   ondansetron (ZOFRAN) tablet 4 mg  4 mg Oral Q6H PRN Cox, Amy N, DO       Or   ondansetron (ZOFRAN) injection 4 mg  4 mg Intravenous Q6H PRN Cox, Amy N, DO       psyllium (HYDROCIL/METAMUCIL) 1 packet  1 packet Oral Daily Cox, Amy N, DO   1 packet at 04/12/20 1051   traMADol (ULTRAM) tablet 50 mg  50 mg Oral Q6H PRN Wyvonnia Dusky, MD   50 mg at 04/12/20 0541     Discharge Medications: Please see discharge summary for a list of discharge medications.  Relevant Imaging Results:  Relevant Lab Results:   Additional Information SS#: [id number]  Candie Chroman, LCSW

## 2020-04-13 NOTE — Progress Notes (Signed)
Discussed again with patient and son and she is agreeable to surgery later today

## 2020-04-13 NOTE — Care Management Important Message (Signed)
Important Message  Patient Details  Name: Jodi Reeves MRN: [id number] Date of Birth: 08-Apr-1928   Medicare Important Message Given:  Yes     Dannette Barbara 04/13/2020, 11:05 AM

## 2020-04-13 NOTE — Progress Notes (Signed)
OT Cancellation Note  Patient Details Name: Jodi Reeves MRN: [id number] DOB: 08/25/1927   Cancelled Treatment:    Reason Eval/Treat Not Completed: Medical issues which prohibited therapy  Pt to undergo Kyphoplasty today. Will f/u at later date/time as appropriate/OT orders updated. Thank you.  Gerrianne Scale, [place], OTR/L ascom [phone] 04/13/20, 2:56 PM

## 2020-04-13 NOTE — TOC Initial Note (Signed)
Transition of Care [facility]) - Initial/Assessment Note    Patient Details  Name: Jodi Reeves MRN: [id number] Date of Birth: 07/11/1927  [facility] of [facility]) CM/SW Contact:    Candie Chroman, LCSW Phone Number: 04/13/2020, 9:44 AM  Clinical Narrative:  CSW met with patient. No supports at bedside. CSW introduced role and explained that PT recommendations would be discussed. Patient is agreeable to SNF placement. She lives home alone although her son and granddaughter [place] same [place] as [place]. No further concerns. CSW encouraged patient to contact CSW as needed. CSW will continue to follow patient for support and facilitate discharge to SNF once medically stable.                Expected Discharge Plan: Skilled Nursing Facility Barriers to Discharge: Continued Medical Work up,Insurance Authorization   Patient Goals and CMS Choice   CMS Medicare.gov Compare Post Acute Care list provided to:: Patient    Expected Discharge Plan and Services Expected Discharge Plan: [place] Choice: [place] arrangements for the past 2 months: Single Family Home                                      Prior Living Arrangements/Services Living arrangements for the past 2 months: Single Family Home Lives with:: Self Patient language and need for interpreter reviewed:: Yes Do you feel safe going back to the place where you live?: Yes      Need for Family Participation in Patient Care: Yes (Comment)     Criminal Activity/Legal Involvement Pertinent to Current Situation/Hospitalization: No - Comment as needed  Activities of Daily Living Home Assistive Devices/Equipment: Cane (specify quad or straight),Walker (specify type) ADL Screening (condition at time of admission) Patient's cognitive ability adequate to safely complete daily activities?: Yes Is the patient deaf or have difficulty hearing?: No Does the patient have  difficulty seeing, even when wearing glasses/contacts?: Yes Does the patient have difficulty concentrating, remembering, or making decisions?: No Patient able to express need for assistance with ADLs?: Yes Does the patient have difficulty dressing or bathing?: No Independently performs ADLs?: Yes (appropriate for developmental age) Does the patient have difficulty walking or climbing stairs?: Yes Weakness of Legs: Both Weakness of Arms/Hands: Both  Permission Sought/Granted Permission sought to share information with : Facility Art therapist granted to share information with : Yes, Verbal Permission Granted     Permission granted to share info w AGENCY: SNF's        Emotional Assessment Appearance:: Appears stated age Attitude/Demeanor/Rapport: Engaged,Gracious Affect (typically observed): Accepting,Appropriate,Calm,Pleasant Orientation: : Oriented to Self,Oriented to Place,Oriented to  Time,Oriented to Situation Alcohol / Substance Use: Not Applicable Psych Involvement: No (comment)  Admission diagnosis:  Nausea [R11.0] Unable to walk [R26.2] Lower abdominal pain [R10.30] Failure to thrive in adult [R62.7] Decreased oral intake [R63.8] Intractable pain [R52] Abdominal pain [R10.9] Compression fracture of T12 vertebra, initial encounter ([place]) [S22.080A] Constipation, unspecified constipation type [K59.00] Leukocytosis, unspecified type [D72.829] UTI (urinary tract infection) [N39.0] Patient Active Problem List   Diagnosis Date Noted  . UTI (urinary tract infection) 04/10/2020  . Abdominal pain 04/09/2020  . Hypertension associated with diabetes ([place]) 03/15/2020  . Obesity (BMI 30-39.9) 03/15/2020  . Coronary artery disease involving native coronary artery of native heart without angina pectoris 03/15/2020  . PAD (peripheral artery disease) ([place]) 08/14/2019  .  Foot ulcer ([place]) 06/19/2019  . Lymphedema 06/09/2019  . Chronic venous insufficiency  06/09/2019  . Diabetic retinopathy associated with type 2 diabetes mellitus ([place]) 01/20/2019  . Low back pain 09/24/2018  . Overactive bladder 04/10/2018  . History of UTI 04/10/2018  . Hematuria 04/10/2018  . Diarrhea 04/10/2018  . Venous ulcer of leg ([place]) 04/10/2018  . Status post endoscopic carpal tunnel release 03/19/2018  . History of skin cancer 11/08/2017  . Chronic anticoagulation 06/11/2017  . Aortic atherosclerosis ([place]) 05/08/2017  . Coronary artery disease, non-occlusive 03/29/2017  . Persistent atrial fibrillation ([place]) 03/29/2017  . Carpal tunnel syndrome 03/28/2017  . Cervical radiculitis 03/28/2017  . Problems with swallowing and mastication   . Esophageal candidiasis ([place])   . Stricture and stenosis of esophagus 05/02/2016  . TIA (transient ischemic attack) 06/24/2015  . 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 Humeral head fracture 10/06/2014  . Chest pain 03/27/2014  . Shortness of breath 03/27/2014  . Chronic diastolic CHF (congestive heart failure) ([place]) 03/27/2014  . Advanced directives, counseling/discussion 02/24/2014  . Routine general medical examination at a health care facility 02/17/2013  . Diabetes, polyneuropathy ([place]) 02/17/2013  . Type 2 diabetes mellitus with diabetic polyneuropathy, without long-term current use of insulin ([place]) 02/17/2013  . Gait instability 04/11/2012  . Edema 11/14/2010  . OTHER CONSTIPATION 05/31/2007  . VENTRAL HERNIA 03/15/2007  . Ovarian cancer ([place]) 11/20/2006  . Hypothyroidism 11/20/2006  . Type 2 diabetes mellitus with neurological manifestations, controlled ([place]) 11/20/2006  . Essential hypertension 11/20/2006  . Atrial fibrillation ([place]) 11/20/2006  . Osteoporosis 11/20/2006  . Hyperlipidemia due to type 2 diabetes mellitus ([place]) 11/20/2006   PCP:  [name], Nino Glow, MD Pharmacy:   [facility] [address], [place]  [address] Phone: [phone] Fax: [place], [place] [address] [address] Phone: [phone] Fax: [place], [place] [address] Phone: [phone] Fax: [phone]     Social Determinants of Health (SDOH) Interventions    Readmission Risk Interventions No flowsheet data found.

## 2020-04-13 NOTE — Anesthesia Postprocedure Evaluation (Signed)
Anesthesia Post Note  Patient: Jodi Reeves  Procedure(s) Performed: Coralyn Helling (N/A )  Patient location during evaluation: PACU Anesthesia Type: General Level of consciousness: awake and alert Pain management: pain level controlled Vital Signs Assessment: post-procedure vital signs reviewed and stable Respiratory status: spontaneous breathing, nonlabored ventilation, respiratory function stable and patient connected to nasal cannula oxygen Cardiovascular status: blood pressure returned to baseline and stable Postop Assessment: no apparent nausea or vomiting Anesthetic complications: no   No complications documented.   Last Vitals:  Vitals:   04/13/20 1950 04/13/20 2005  BP: (!) 160/81 (!) 161/97  Pulse: (!) 103 97  Resp: 19 19  Temp:  36.6 C  SpO2: 90% 93%    Last Pain:  Vitals:   04/13/20 2005  TempSrc:   PainSc: Asleep                 Arita Miss

## 2020-04-13 NOTE — Progress Notes (Addendum)
PROGRESS NOTE    Jodi Reeves  [id number] DOB: 06/04/1927 DOA: 04/09/2020 PCP: [name], Nino Glow, MD   Assessment & Plan:   Principal Problem:   Abdominal pain Active Problems:   Hypothyroidism   Essential hypertension   Atrial fibrillation (HCC)   Gait instability   Diabetes, polyneuropathy (HCC)   Chronic diastolic CHF (congestive heart failure) (HCC)   Coronary artery disease, non-occlusive   Type 2 diabetes mellitus with diabetic polyneuropathy, without long-term current use of insulin (HCC)   PAD (peripheral artery disease) (HCC)   UTI (urinary tract infection)  Generalized weakness: PT/OT recs SNF   T12 compression fracture: acute vs subacute as per CT scan. Likely secondary to multiple falls. Will go for kyphoplasty later today as per ortho surg  Abdominal pain: likely secondary to constipation as per CT scan. No obstruction. Colace, dulcolax prn   Possible peripheral neuropathy: not on any gabapentin or lyrica at home as per med rec  Hypokalemia: KCl repleted. Will continue to monitor   Thrombocytopenia: etiology unclear, stable. Will continue to monitor   Leukocytosis: resolved  Hypothyroidism: continue on home dose of levothyroxine   HTN: continue on home dose of losartan, bisoprolol.   UTI: urine cx is growing stap haemolyticus. Continue on IV doxycycline   Persistent atrial fibrillation: continue on bisoprolol. Hold xarelto for kyphoplasty today     DVT prophylaxis: xarelto Code Status: full Family Communication:  Disposition Plan: likely d/c to SNF   Status is: Inpatient  Remains inpatient appropriate because:Ongoing diagnostic testing needed not appropriate for outpatient work up, Unsafe d/c plan and IV treatments appropriate due to intensity of illness or inability to take PO, kyphoplasty today    Dispo: The patient is from: Home              Anticipated d/c is to: SNF              Anticipated d/c date is: 3 days               Patient currently is not medically stable to d/c.          Consultants:      Procedures:   Antimicrobials: doxycycline   Subjective: Pt c/o back and abd pain   Objective: Vitals:   04/12/20 1227 04/12/20 1542 04/12/20 1942 04/13/20 0540  BP: 123/78 (!) 127/94 (!) 156/90 (!) 152/87  Pulse: 78 77 90 88  Resp: 18 20 18 18   Temp: 98.1 F (36.7 C) 98.8 F (37.1 C) 99.4 F (37.4 C) 98.5 F (36.9 C)  TempSrc: Oral  Oral Oral  SpO2: 92% 95% 94% 95%  Weight:      Height:        Intake/Output Summary (Last 24 hours) at 04/13/2020 0725 Last data filed at 04/13/2020 0500 Gross per 24 hour  Intake 240 ml  Output 950 ml  Net -710 ml   Filed Weights   04/08/20 2008  Weight: 78.5 kg    Examination:  General exam: Appears calm but uncomfortable Respiratory system: clear breath sounds b/l. No wheezes, rales Cardiovascular system: S1/S2+. No rubs or galllops Gastrointestinal system: Abd is soft, obese, tender to palpation & hypoactive bowel sounds Central nervous system: Alert and oriented. Moves all 4 extremities  Psychiatry: Judgement and insight appear abnormal. Flat mood and affect     Data Reviewed: I have personally reviewed following labs and imaging studies  CBC: Recent Labs  Lab 04/09/20 0837 04/10/20 0559 04/11/20 0354 04/12/20 0300 04/13/20 0530  WBC 8.5 8.4 7.4 7.0 7.4  HGB 11.3* 12.0 11.3* 10.7* 10.8*  HCT 33.9* 35.6* 32.4* 32.1* 32.1*  MCV 100.0 100.0 98.8 100.0 100.0  PLT 162 168 148* 148* [id number]*   Basic Metabolic Panel: Recent Labs  Lab 04/09/20 0837 04/10/20 0559 04/11/20 0354 04/12/20 0300 04/13/20 0530  NA 135 138 136 139 138  K 3.7 3.8 3.2* 3.6 3.4*  CL 99 102 101 105 102  CO2 24 26 26 28 27   GLUCOSE 177* 194* 185* 179* 182*  BUN 27* 18 12 13 12   CREATININE 0.74 0.60 0.52 0.61 0.50  CALCIUM 8.6* 8.2* 7.9* 7.9* 8.1*   GFR: Estimated Creatinine Clearance: 46.3 mL/min (by C-G formula based on SCr of 0.5 mg/dL). Liver  Function Tests: Recent Labs  Lab 04/08/20 2013  AST 33  ALT 11  ALKPHOS 69  BILITOT 1.1  PROT 8.0  ALBUMIN 3.7   Recent Labs  Lab 04/08/20 2013  LIPASE 21   No results for input(s): AMMONIA in the last 168 hours. Coagulation Profile: No results for input(s): INR, PROTIME in the last 168 hours. Cardiac Enzymes: No results for input(s): CKTOTAL, CKMB, CKMBINDEX, TROPONINI in the last 168 hours. BNP (last 3 results) No results for input(s): PROBNP in the last 8760 hours. HbA1C: No results for input(s): HGBA1C in the last 72 hours. CBG: No results for input(s): GLUCAP in the last 168 hours. Lipid Profile: No results for input(s): CHOL, HDL, LDLCALC, TRIG, CHOLHDL, LDLDIRECT in the last 72 hours. Thyroid Function Tests: No results for input(s): TSH, T4TOTAL, FREET4, T3FREE, THYROIDAB in the last 72 hours. Anemia Panel: No results for input(s): VITAMINB12, FOLATE, FERRITIN, TIBC, IRON, RETICCTPCT in the last 72 hours. Sepsis Labs: Recent Labs  Lab 04/09/20 0250  LATICACIDVEN 1.7    Recent Results (from the past 240 hour(s))  Urine Culture     Status: Abnormal   Collection Time: 04/08/20  9:34 PM   Specimen: Urine, Random  Result Value Ref Range Status   Specimen Description   Final    URINE, RANDOM Performed at [facility], [address], [place], [place] [address]    Special Requests   Final    Normal Performed at [facility], [place], [place] [address]    Culture 20,000 COLONIES/mL STAPHYLOCOCCUS HAEMOLYTICUS (A)  Final   Report Status 04/11/2020 FINAL  Final   Organism ID, Bacteria STAPHYLOCOCCUS HAEMOLYTICUS (A)  Final      Susceptibility   Staphylococcus haemolyticus - MIC*    CIPROFLOXACIN >=8 RESISTANT Resistant     GENTAMICIN <=0.5 SENSITIVE Sensitive     NITROFURANTOIN 32 SENSITIVE Sensitive     OXACILLIN >=4 RESISTANT Resistant     TETRACYCLINE <=1 SENSITIVE Sensitive     VANCOMYCIN <=0.5 SENSITIVE Sensitive      TRIMETH/SULFA <=10 SENSITIVE Sensitive     CLINDAMYCIN RESISTANT Resistant     RIFAMPIN <=0.5 SENSITIVE Sensitive     Inducible Clindamycin POSITIVE Resistant     * 20,000 COLONIES/mL STAPHYLOCOCCUS HAEMOLYTICUS  Resp Panel by RT-PCR (Flu A&B, Covid) Nasopharyngeal Swab     Status: None   Collection Time: 04/09/20  3:01 AM   Specimen: Nasopharyngeal Swab; Nasopharyngeal(NP) swabs in vial transport medium  Result Value Ref Range Status   SARS Coronavirus 2 by RT PCR NEGATIVE NEGATIVE Final    Comment: (NOTE) SARS-CoV-2 target nucleic acids are NOT DETECTED.  The SARS-CoV-2 RNA is generally detectable in upper respiratory specimens during the acute phase of infection. The  lowest concentration of SARS-CoV-2 viral copies this assay can detect is 138 copies/mL. A negative result does not preclude SARS-Cov-2 infection and should not be used as the sole basis for treatment or other patient management decisions. A negative result may occur with  improper specimen collection/handling, submission of specimen other than nasopharyngeal swab, presence of viral mutation(s) within the areas targeted by this assay, and inadequate number of viral copies(<138 copies/mL). A negative result must be combined with clinical observations, patient history, and epidemiological information. The expected result is Negative.  Fact Sheet for Patients:  EntrepreneurPulse.com.au  Fact Sheet for Healthcare Providers:  IncredibleEmployment.be  This test is no t yet approved or cleared by the [place] FDA and  has been authorized for detection and/or diagnosis of SARS-CoV-2 by FDA under an Emergency Use Authorization (EUA). This EUA will remain  in effect (meaning this test can be used) for the duration of the COVID-19 declaration under Section 564(b)(1) of the Act, 21 U.S.C.section 360bbb-3(b)(1), unless the authorization is terminated  or revoked sooner.        Influenza A by PCR NEGATIVE NEGATIVE Final   Influenza B by PCR NEGATIVE NEGATIVE Final    Comment: (NOTE) The Xpert Xpress SARS-CoV-2/FLU/RSV plus assay is intended as an aid in the diagnosis of influenza from Nasopharyngeal swab specimens and should not be used as a sole basis for treatment. Nasal washings and aspirates are unacceptable for Xpert Xpress SARS-CoV-2/FLU/RSV testing.  Fact Sheet for Patients: EntrepreneurPulse.com.au  Fact Sheet for Healthcare Providers: IncredibleEmployment.be  This test is not yet approved or cleared by the [place] FDA and has been authorized for detection and/or diagnosis of SARS-CoV-2 by FDA under an Emergency Use Authorization (EUA). This EUA will remain in effect (meaning this test can be used) for the duration of the COVID-19 declaration under Section 564(b)(1) of the Act, 21 U.S.C. section 360bbb-3(b)(1), unless the authorization is terminated or revoked.  Performed at [facility], [address] [place], [place] [address]   C Difficile Quick Screen w PCR reflex     Status: None   Collection Time: 04/09/20  2:44 PM   Specimen: STOOL  Result Value Ref Range Status   C Diff antigen NEGATIVE NEGATIVE Final   C Diff toxin NEGATIVE NEGATIVE Final   C Diff interpretation No C. difficile detected.  Final    Comment: Performed at [facility], [address] [place], [place] [address]  Gastrointestinal Panel by PCR , Stool     Status: None   Collection Time: 04/09/20  2:44 PM   Specimen: Stool  Result Value Ref Range Status   Campylobacter species NOT DETECTED NOT DETECTED Final   Plesimonas shigelloides NOT DETECTED NOT DETECTED Final   Salmonella species NOT DETECTED NOT DETECTED Final   Yersinia enterocolitica NOT DETECTED NOT DETECTED Final   Vibrio species NOT DETECTED NOT DETECTED Final   Vibrio cholerae NOT DETECTED NOT DETECTED Final   Enteroaggregative E coli (EAEC)  NOT DETECTED NOT DETECTED Final   Enteropathogenic E coli (EPEC) NOT DETECTED NOT DETECTED Final   Enterotoxigenic E coli (ETEC) NOT DETECTED NOT DETECTED Final   Shiga like toxin producing E coli (STEC) NOT DETECTED NOT DETECTED Final   Shigella/Enteroinvasive E coli (EIEC) NOT DETECTED NOT DETECTED Final   Cryptosporidium NOT DETECTED NOT DETECTED Final   Cyclospora cayetanensis NOT DETECTED NOT DETECTED Final   Entamoeba histolytica NOT DETECTED NOT DETECTED Final   Giardia lamblia NOT DETECTED NOT DETECTED Final   Adenovirus  F40/41 NOT DETECTED NOT DETECTED Final   Astrovirus NOT DETECTED NOT DETECTED Final   Norovirus GI/GII NOT DETECTED NOT DETECTED Final   Rotavirus A NOT DETECTED NOT DETECTED Final   Sapovirus (I, II, IV, and V) NOT DETECTED NOT DETECTED Final    Comment: Performed at [facility], [address], [place], [place] [address]         Radiology Studies: No results found.      Scheduled Meds: . ascorbic acid  250 mg Oral Daily  . bisoprolol  5 mg Oral Daily  . furosemide  20 mg Oral BID  . levothyroxine  50 mcg Oral QAC breakfast  . losartan  12.5 mg Oral Daily  . multivitamin with minerals  1 tablet Oral Daily  . psyllium  1 packet Oral Daily   Continuous Infusions: . sodium chloride Stopped (04/11/20 2343)  . clindamycin (CLEOCIN) IV    . doxycycline (VIBRAMYCIN) IV 100 mg (04/13/20 0107)     LOS: 3 days    Time spent: 35 mins    Wyvonnia Dusky, MD [facility] Pager [phone]  If 7PM-7AM, please contact night-coverage 04/13/2020, 7:25 AM

## 2020-04-13 NOTE — Transfer of Care (Signed)
Immediate Anesthesia Transfer of Care Note  Patient: Jodi Reeves  Procedure(s) Performed: Coralyn Helling (N/A )  Patient Location: PACU  Anesthesia Type:MAC  Level of Consciousness: awake, alert , oriented and patient cooperative  Airway & Oxygen Therapy: Patient Spontanous Breathing  Post-op Assessment: Report given to RN and Post -op Vital signs reviewed and stable  Post vital signs: Reviewed and stable  Last Vitals:  Vitals Value Taken Time  BP 145/80 04/13/20 1935  Temp 36.4 C 04/13/20 1935  Pulse 109 04/13/20 1937  Resp 25 04/13/20 1937  SpO2 93 % 04/13/20 1937  Vitals shown include unvalidated device data.  Last Pain:  Vitals:   04/13/20 0730  TempSrc:   PainSc: Asleep      Patients Stated Pain Goal: 0 (69/40/98 2867)  Complications: No complications documented.

## 2020-04-13 NOTE — Progress Notes (Deleted)
06/25/2018 7:59 PM   Jodi Reeves 09-02-1927 [id number]  Referring provider: [name], Nino Glow, MD [place],  [place] [address]  No chief complaint on file.   HPI: Jodi Reeves is a 86 y.o. female with a past urological history of high risk hematuria, rUTI's and frequency who presents today for one year follow up.    History of hematuria (high risk) Non-smoker.  CT urogram 12/2017 both adrenal glands appear normal. No urinary tract calculi are identified. No renal parenchymal mass observed. Borderline urinary bladder wall thickening is probably from nondistention. Right greater than left bladder diverticula noted. No abnormal filling defect identified along the urothelium.  Cysto with [name] 01/2018 NED.   No reports of gross hematuria.   rUTI's + Klebsiella pneumonemia resistant to ampicillin - Levaquin x 5 days on 04/06/2020 + Staphylococcus hemolyticus resistant to cipro, clindamycin and oxacillin -  Levaquin She is using vaginal estrogen cream three night weekly.   Patient denies any modifying or aggravating factors.  Patient denies any gross hematuria, dysuria or suprapubic/flank pain.  Patient denies any fevers, chills, nausea or vomiting. ***  Frequency The patient is  experiencing urgency x 0-3 (improved), frequency x 4-7 (stable), is restricting fluids to avoid visits to the restroom, is engaging in toilet mapping, incontinence x 4-7 (worsening) and nocturia x 4-7 (stable).   Her BP is 165/83.   Her PVR is 48 mL.  She is not longer taking the Myrbetriq.  She is on Lasix for congestive heart failure and also suffers from bilateral lymphedema.    PMH: Past Medical History:  Diagnosis Date  . Allergy   . Anxiety   . Balance disorder   . Chronic diastolic CHF (congestive heart failure) ([place])   . Colon polyps   . Coronary artery disease, non-occlusive    a. LHC 11/2003: 10% LAD stenosis, 20% LCx stenosis, and 40% RCA stenosis; b. nuc stress  test 12/15: small region of mild ischemia in the apical region with WMA also noted in the apical region, EF 60%. She declined invasive evaluation at that time  . DM2 (diabetes mellitus, type 2) ([place])   . Fall    04/2017 [facility] multiple fractures, brusing   . Falls frequently    h/o left shoudler/wrist fracture and left fingers numb  . GERD (gastroesophageal reflux disease)    esophageal web  . 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 History of chicken pox   . History of eating disorder   . HLD (hyperlipidemia)   . HTN (hypertension)   . Hypothyroidism   . IBS (irritable bowel syndrome)    diarrhea   . Incontinence of bowel   . Osteoporosis   . Ovarian cancer ([place])    1980  . Persistent atrial fibrillation ([place])    a. CHADS2VASc = > 8 (CHF, HTN, age x 2, DM, TIA x 2, female); b. on Xarelto  . SBO (small bowel obstruction) ([place])    1998  . TIA (transient ischemic attack)   . UTI (urinary tract infection)   . Venous insufficiency     Surgical History: Past Surgical History:  Procedure Laterality Date  . 2nd look laparotomy  1982  . ABDOMINAL HYSTERECTOMY     for ovarian cancer s/p hysterectomy total 1976 and exp lap 1980  . APPENDECTOMY    . CARDIAC CATHETERIZATION  8/05   neg; a fib found   . CARPAL TUNNEL RELEASE Left 03/06/2018  Procedure: CARPAL TUNNEL RELEASE ENDOSCOPIC;  Surgeon: Corky Mull, MD;  Location: [facility];  Service: Orthopedics;  Laterality: Left;  diabetic - oral meds  . CARPAL TUNNEL RELEASE     left CTS Dr.Poggi  . cataract OD  2002  . cataract surgery  5/07   R  . CHOLECYSTECTOMY  1986  . DEXA  9/04 and 1/02  . EGD/dilation/colon  1/06  . ESOPHAGOGASTRODUODENOSCOPY (EGD) WITH PROPOFOL N/A 05/16/2016   Procedure: ESOPHAGOGASTRODUODENOSCOPY (EGD) WITH PROPOFOL with dilation;  Surgeon: Lucilla Lame, MD;  Location: [facility];  Service: Endoscopy;  Laterality: N/A;  . FEMORAL HERNIA REPAIR  1958   R  .  fracture L elbow/wrist  2000  . intussception/obstruction  12/98  . JOINT REPLACEMENT     left shoulder  . kidney stone x2  1991  . laminectomy L4-5  1971  . LUMBAR LAMINECTOMY     1970 ruptured disc   . MOUTH SURGERY    . myoview stress  8/05   syncope (-)  . REVERSE SHOULDER ARTHROPLASTY Left 10/06/2014   Procedure: REVERSE SHOULDER ARTHROPLASTY;  Surgeon: Corky Mull, MD;  Location: [facility];  Service: Orthopedics;  Laterality: Left;  . stillbirth  1969  . TOTAL ABDOMINAL HYSTERECTOMY W/ BILATERAL SALPINGOOPHORECTOMY  1980   ovarian cancer  . WRIST FRACTURE SURGERY  11/05   R    Home Medications:  Allergies as of 04/14/2020      Reactions   Amiodarone Other (See Comments)   Severe Thyroid issues    Penicillins Anaphylaxis, Other (See Comments)   Has patient had a PCN reaction causing immediate rash, facial/tongue/throat swelling, SOB or lightheadedness with hypotension: Yes Has patient had a PCN reaction causing severe rash involving mucus membranes or skin necrosis: No Has patient had a PCN reaction that required hospitalization: No Has patient had a PCN reaction occurring within the last 10 years: No If all of the above answers are "NO", then may proceed with Cephalosporin use.   Actos [pioglitazone]    Not effective    Amaryl [glimepiride]    Not effective    Atorvastatin Other (See Comments)   Muscle aches & All statins per Patient   Bentyl [dicyclomine Hcl]    Could not tolerate nausea, reduced concentration, h/a   Ciprofloxacin Other (See Comments)   High blood pressure    Codeine Nausea And Vomiting   Ezetimibe-simvastatin Other (See Comments)   Muscle aches, nausea, back pain    Fosamax [alendronate]    dysphagia   Lovastatin Other (See Comments)   Myalgias   Macrobid [nitrofurantoin Macrocrystal]    Severe Itching, rash   Protonix [pantoprazole Sodium]    Esophageal problems    Rosiglitazone Maleate Other (See Comments)   Edema   Statins    Muscle  and joint aches to all statins   Victoza [liraglutide]    Nausea   Alendronate Sodium Rash   Dysphagia and ulceration    Bactrim [sulfamethoxazole-trimethoprim] Rash   itching   Sulfa Antibiotics Rash   Itching      Medication List    Notice   This visit is during an admission. Changes to the med list made in this visit will be reflected in the After Visit Summary of the admission.     Allergies:  Allergies  Allergen Reactions  . Amiodarone Other (See Comments)    Severe Thyroid issues   . Penicillins Anaphylaxis and Other (See Comments)    Has patient had a PCN  reaction causing immediate rash, facial/tongue/throat swelling, SOB or lightheadedness with hypotension: Yes Has patient had a PCN reaction causing severe rash involving mucus membranes or skin necrosis: No Has patient had a PCN reaction that required hospitalization: No Has patient had a PCN reaction occurring within the last 10 years: No If all of the above answers are "NO", then may proceed with Cephalosporin use.   . Actos [Pioglitazone]     Not effective   . Amaryl [Glimepiride]     Not effective    . Atorvastatin Other (See Comments)    Muscle aches & All statins per Patient  . Bentyl [Dicyclomine Hcl]     Could not tolerate nausea, reduced concentration, h/a   . Ciprofloxacin Other (See Comments)    High blood pressure    . Codeine Nausea And Vomiting  . Ezetimibe-Simvastatin Other (See Comments)    Muscle aches, nausea, back pain   . Fosamax [Alendronate]     dysphagia  . Lovastatin Other (See Comments)    Myalgias  . Macrobid [Nitrofurantoin Macrocrystal]     Severe Itching, rash   . Protonix [Pantoprazole Sodium]     Esophageal problems    . Rosiglitazone Maleate Other (See Comments)    Edema  . Statins     Muscle and joint aches to all statins  . Victoza [Liraglutide]     Nausea   . Alendronate Sodium Rash    Dysphagia and ulceration   . Bactrim [Sulfamethoxazole-Trimethoprim] Rash     itching  . Sulfa Antibiotics Rash    Itching     Family History: Family History  Problem Relation Age of Onset  . Early death Father   . Diabetes Mother   . Heart disease Mother   . Arthritis Brother   . Cancer Brother   . Depression Brother   . Hearing loss Brother   . Arthritis Brother   . Cancer Brother        colon  . Hearing loss Brother   . Cancer Brother   . Diabetes Brother   . Hearing loss Brother   . Heart disease Brother   . Hyperlipidemia Brother   . Heart disease Sister   . Hypertension Sister   . Stroke Sister   . Hearing loss Daughter   . Hypertension Daughter   . Diabetes Paternal Grandfather   . Hearing loss Sister   . Heart disease Sister   . Arthritis Sister   . Depression Sister   . Hearing loss Sister   . Heart disease Sister   . COPD Brother   . Early death Brother   . Early death Brother     Social History:  reports that she has never smoked. She has never used smokeless tobacco. She reports that she does not drink alcohol and does not use drugs.  ROS: For pertinent review of systems please refer to history of present illness  Physical Exam: There were no vitals taken for this visit.  Constitutional:  Well nourished. Alert and oriented, No acute distress. HEENT: [place] AT, moist mucus membranes.  Trachea midline, no masses. Cardiovascular: No clubbing, cyanosis, or edema. Respiratory: Normal respiratory effort, no increased work of breathing. GI: Abdomen is soft, non tender, non distended, no abdominal masses. Liver and spleen not palpable.  No hernias appreciated.  Stool sample for occult testing is not indicated.   GU: No CVA tenderness.  No bladder fullness or masses.  *** external genitalia, *** pubic hair distribution, no lesions.  Normal  urethral meatus, no lesions, no prolapse, no discharge.   No urethral masses, tenderness and/or tenderness. No bladder fullness, tenderness or masses. *** vagina mucosa, *** estrogen effect, no  discharge, no lesions, *** pelvic support, *** cystocele and *** rectocele noted.  No cervical motion tenderness.  Uterus is freely mobile and non-fixed.  No adnexal/parametria masses or tenderness noted.  Anus and perineum are without rashes or lesions.   ***  Skin: No rashes, bruises or suspicious lesions. Lymph: No cervical or inguinal adenopathy. Neurologic: Grossly intact, no focal deficits, moving all 4 extremities. Psychiatric: Normal mood and affect.   Laboratory Data: Lab Results  Component Value Date   WBC 7.4 04/13/2020   HGB 10.8 (L) 04/13/2020   HCT 32.1 (L) 04/13/2020   MCV 100.0 04/13/2020   PLT 148 (L) 04/13/2020    Lab Results  Component Value Date   CREATININE 0.50 04/13/2020   Lab Results  Component Value Date   HGBA1C 6.9 (H) 09/26/2019    Lab Results  Component Value Date   TSH 1.28 03/10/2020       Component Value Date/Time   CHOL 171 03/10/2020 1353   CHOL 185 09/26/2019 1011   HDL 67.50 03/10/2020 1353   HDL 67 09/26/2019 1011   CHOLHDL 3 03/10/2020 1353   VLDL 15.8 03/10/2020 1353   LDLCALC 87 03/10/2020 1353   LDLCALC 107 (H) 09/26/2019 1011    Lab Results  Component Value Date   AST 33 04/08/2020   Lab Results  Component Value Date   ALT 11 04/08/2020   I have reviewed the labs.  Pertinent Imaging: Results for SHALI, HAU (MRN [id number]) as of 07/02/2019 09:14  Ref. Range 06/26/2019 13:51  Scan Result Unknown 48    Assessment & Plan:    1. Frequency/Nocturia - she is not wanting to talk any medications at this time for her urinary symptoms - It will be difficult to manage her incontinence with her Lasix and lymphedema, so she will continue to manage conservatively at this time - she will follow up in one year  2. rUTI's - Has been evaluated by Infectious Disease and they have recommended starting vaginal estrogen cream, urodynamics, and ensuring patient empties her bladder.  - Urodynamic study deferred due to the  patients transportation issues.  - PVRs minimal in the office.  - She has continued the vaginal estrogen cream (Estrace)  3. History of hematuria - Hematuria work up completed in 01/29/2018 - with no concerning findings on cystoscopy.  - No report of gross hematuria  - RTC in one year - patient to report any gross hematuria in the interim                                          No follow-ups on file.  Laneta Simmers  [facility] [address]  [place], [place] [address] [phone]

## 2020-04-13 NOTE — Evaluation (Signed)
Clinical/Bedside Swallow Evaluation Patient Details  Name: Jodi Reeves MRN: [id number] Date of Birth: 01/21/1928  Today's Date: 04/13/2020 Time: SLP Start Time (ACUTE ONLY): 0840 SLP Stop Time (ACUTE ONLY): 0930 SLP Time Calculation (min) (ACUTE ONLY): 50 min  Past Medical History:  Past Medical History:  Diagnosis Date  . Allergy   . Anxiety   . Balance disorder   . Chronic diastolic CHF (congestive heart failure) ([place])   . Colon polyps   . Coronary artery disease, non-occlusive    a. LHC 11/2003: 10% LAD stenosis, 20% LCx stenosis, and 40% RCA stenosis; b. nuc stress test 12/15: small region of mild ischemia in the apical region with WMA also noted in the apical region, EF 60%. She declined invasive evaluation at that time  . DM2 (diabetes mellitus, type 2) ([place])   . Fall    04/2017 [facility] multiple fractures, brusing   . Falls frequently    h/o left shoudler/wrist fracture and left fingers numb  . GERD (gastroesophageal reflux disease)    esophageal web  . 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 History of chicken pox   . History of eating disorder   . HLD (hyperlipidemia)   . HTN (hypertension)   . Hypothyroidism   . IBS (irritable bowel syndrome)    diarrhea   . Incontinence of bowel   . Osteoporosis   . Ovarian cancer ([place])    1980  . Persistent atrial fibrillation ([place])    a. CHADS2VASc = > 8 (CHF, HTN, age x 2, DM, TIA x 2, female); b. on Xarelto  . SBO (small bowel obstruction) ([place])    1998  . TIA (transient ischemic attack)   . UTI (urinary tract infection)   . Venous insufficiency    Past Surgical History:  Past Surgical History:  Procedure Laterality Date  . 2nd look laparotomy  1982  . ABDOMINAL HYSTERECTOMY     for ovarian cancer s/p hysterectomy total 1976 and exp lap 1980  . APPENDECTOMY    . CARDIAC CATHETERIZATION  8/05   neg; a fib found   . CARPAL TUNNEL RELEASE Left 03/06/2018   Procedure: CARPAL TUNNEL RELEASE  ENDOSCOPIC;  Surgeon: Corky Mull, MD;  Location: [facility];  Service: Orthopedics;  Laterality: Left;  diabetic - oral meds  . CARPAL TUNNEL RELEASE     left CTS Dr.Poggi  . cataract OD  2002  . cataract surgery  5/07   R  . CHOLECYSTECTOMY  1986  . DEXA  9/04 and 1/02  . EGD/dilation/colon  1/06  . ESOPHAGOGASTRODUODENOSCOPY (EGD) WITH PROPOFOL N/A 05/16/2016   Procedure: ESOPHAGOGASTRODUODENOSCOPY (EGD) WITH PROPOFOL with dilation;  Surgeon: Lucilla Lame, MD;  Location: [facility];  Service: Endoscopy;  Laterality: N/A;  . FEMORAL HERNIA REPAIR  1958   R  . fracture L elbow/wrist  2000  . intussception/obstruction  12/98  . JOINT REPLACEMENT     left shoulder  . kidney stone x2  1991  . laminectomy L4-5  1971  . LUMBAR LAMINECTOMY     1970 ruptured disc   . MOUTH SURGERY    . myoview stress  8/05   syncope (-)  . REVERSE SHOULDER ARTHROPLASTY Left 10/06/2014   Procedure: REVERSE SHOULDER ARTHROPLASTY;  Surgeon: Corky Mull, MD;  Location: [facility];  Service: Orthopedics;  Laterality: Left;  . stillbirth  1969  . TOTAL ABDOMINAL HYSTERECTOMY W/ BILATERAL SALPINGOOPHORECTOMY  1980   ovarian cancer  .  WRIST FRACTURE SURGERY  11/05   R   HPI:  Pt is a 86 y.o. female with Extensive medical history as listed in chart including IBS, GERD, Esophageal stricture/dilation, Esophagitis, Anxiety who presents for evaluation of about 10 days of viral respiratory symptoms including sore throat, congestion, and some shortness of breath with exertion and mild cough. She also has had about 2 days of intermittent sharp and severe lower abdominal pain that is worse on the left, worse with ambulation, and nothing in particular makes it better or worse. She is seen multiple providers recently in the inpatient and ED settings, and was diagnosed with a urinary tract infection last week for which she was treated with Levaquin. She believes that the Levaquin has caused her to be very nauseated  and unable to eat or drink very much but she has not vomited. She has a history of chronic diarrhea which has been worse recently as well. She feels generally weak and has had a substantial decrease in her stamina as well as her ability to ambulate. Attempts at ambulation causes her pain to be worse in her lower abdomen and her lower back. Pt is being followed by Orthopedics for a T 12 fracture found per MRI. CT of Abd. revealed no acute lung issues, Chronic lung base atelectasis or scarring not  significantly changed.  Pt was able to describe her medical history and "dysphagia" as Esophageal phase deficits/dysmotility. She has been followed by GI in the past but stated she is unsure when she "last had my Esophagus stretched". (Any Esophageal phase deficits can impact oropharyngeal phases of swallowing; appetite)   Assessment / Plan / Recommendation Clinical Impression  Pt appears to present w/ adequate oropharyngeal phase swallow w/ No oropharyngeal phase dysphagia noted, No neuromuscular deficits noted. Pt consumed po trials w/ No overt, clinical s/s of aspiration during po trials. Pt appears at reduced risk for aspiration from an oropharyngeal phase standpoint when following general aspiration precautions. However, pt has Baseline Esophageal phase deficits/dysphagia per her reprot. Pt was able to describe her medical history and "dysphagia" as Esophageal phase deficits/dysmotility. She has been followed by GI in the past for Dilation but stated she is unsure when she "last had my Esophagus stretched". (Any Esophageal phase deficits can impact oropharyngeal phases of swallowing; appetite).  During po trials, pt consumed thin liquid and puree trials (meds in puree w/ NSG present) w/ no overt coughing, decline in vocal quality, or change in respiratory presentation during/post trials. BELCHING noted consistently. Oral phase appeared [facility] w/ timely bolus management, mastication, and control of bolus propulsion for  A-P transfer for swallowing. Oral clearing achieved w/ all trial consistencies. Pt stated she "take my time chewing my foods well d/t my Esophagus" -- she was encouraged to continue doing so and moistening her foods well. OM Exam appeared [facility] w/ no unilateral weakness noted. Speech Clear. Pt fed self w/ setup support. Recommend a Regular consistency diet w/ well-Cut meats, moistened foods; Thin liquids VIA CUP - less straw use for swallowing less air if helpful for pt. Recommend general REFLUX and aspiration precautions, Pills WHOLE in Puree for safer, easier swallowing as pt described Larger pills causing difficulty to swallow. Education given on Pills in Puree; food consistencies and easy to eat options; general REFLUX and aspiration precautions. NSG to reconsult if any new needs arise. Recommend f/u w/ GI if helpful; Dietician also. NSG agreed. MD updated. SLP Visit Diagnosis: Dysphagia, pharyngoesophageal phase (R13.14) (ESOPHAGEAL DYSMOTILITY BASELINE)  Aspiration Risk   (reduced following general precautions)    Diet Recommendation  Regular diet w/ more mech soft consistency foods esp. Meats/foods cut small/moistened well(less meats and bread in diet rec'd); Thin liquids. General aspiration precautions; STRICT REFLUX PRECAUTIONS  Medication Administration: Whole meds with puree (for easier swallowing)    Other  Recommendations Recommended Consults: Consider GI evaluation;Consider esophageal assessment (if desired; Dietician f/u) Oral Care Recommendations: Oral care BID;Oral care before and after PO;Patient independent with oral care Other Recommendations:  (n/a)   Follow up Recommendations None      Frequency and Duration  (n/a)   (n/a)       Prognosis Prognosis for Safe Diet Advancement: Fair (-Good) Barriers to Reach Goals: Severity of deficits;Time post onset (GERD)      Swallow Study   General Date of Onset: 04/09/20 HPI: Pt is a 86 y.o. female with Extensive medical history  as listed in chart including IBS, GERD, Esophageal stricture/dilation, Esophagitis, Anxiety who presents for evaluation of about 10 days of viral respiratory symptoms including sore throat, congestion, and some shortness of breath with exertion and mild cough. She also has had about 2 days of intermittent sharp and severe lower abdominal pain that is worse on the left, worse with ambulation, and nothing in particular makes it better or worse. She is seen multiple providers recently in the inpatient and ED settings, and was diagnosed with a urinary tract infection last week for which she was treated with Levaquin. She believes that the Levaquin has caused her to be very nauseated and unable to eat or drink very much but she has not vomited. She has a history of chronic diarrhea which has been worse recently as well. She feels generally weak and has had a substantial decrease in her stamina as well as her ability to ambulate. Attempts at ambulation causes her pain to be worse in her lower abdomen and her lower back. Pt is being followed by Orthopedics for a T 12 fracture found per MRI. CT of Abd. revealed no acute lung issues, Chronic lung base atelectasis or scarring not  significantly changed.  Pt was able to describe her medical history and "dysphagia" as Esophageal phase deficits/dysmotility. She has been followed by GI in the past but stated she is unsure when she "last had my Esophagus stretched". (Any Esophageal phase deficits can impact oropharyngeal phases of swallowing; appetite) Type of Study: Bedside Swallow Evaluation Previous Swallow Assessment: none reported Diet Prior to this Study: NPO (regular diet at home) Temperature Spikes Noted: No (wbc 7.4) Respiratory Status: Room air History of Recent Intubation: No Behavior/Cognition: Alert;Cooperative;Pleasant mood Oral Cavity Assessment: Within Functional Limits Oral Care Completed by SLP: Yes Oral Cavity - Dentition: Adequate natural  dentition Vision: Functional for self-feeding Self-Feeding Abilities: Able to feed self Patient Positioning: Upright in bed (min positioning support given) Baseline Vocal Quality: Normal Volitional Cough: Strong Volitional Swallow: Able to elicit    Oral/Motor/Sensory Function Overall Oral Motor/Sensory Function: Within functional limits   Ice Chips Ice chips: Not tested   Thin Liquid Thin Liquid: Within functional limits Presentation: Cup;Self Fed (~4 ozs)    Nectar Thick Nectar Thick Liquid: Not tested   Honey Thick Honey Thick Liquid: Not tested   Puree Puree: Within functional limits Presentation: Spoon (3 trials (for meds))   Solid     Solid: Not tested       Orinda Kenner, MS, CCC-SLP Speech Language Pathologist Rehab Services [phone] Taria Castrillo 04/13/2020,10:53 AM

## 2020-04-14 ENCOUNTER — Ambulatory Visit: Payer: Medicare HMO | Admitting: Orthotics

## 2020-04-14 ENCOUNTER — Encounter: Payer: Self-pay | Admitting: Orthopedic Surgery

## 2020-04-14 ENCOUNTER — Encounter: Payer: Self-pay | Admitting: Urology

## 2020-04-14 ENCOUNTER — Ambulatory Visit: Payer: Medicare HMO | Admitting: Urology

## 2020-04-14 DIAGNOSIS — N3000 Acute cystitis without hematuria: Secondary | ICD-10-CM

## 2020-04-14 LAB — BASIC METABOLIC PANEL
Anion gap: 9 (ref 5–15)
BUN: 12 mg/dL (ref 8–23)
CO2: 24 mmol/L (ref 22–32)
Calcium: 8.1 mg/dL — ABNORMAL LOW (ref 8.9–10.3)
Chloride: 104 mmol/L (ref 98–111)
Creatinine, Ser: 0.46 mg/dL (ref 0.44–1.00)
GFR, Estimated: >60 mL/min (ref >60)
Glucose, Bld: 186 mg/dL — ABNORMAL HIGH (ref 70–99)
Potassium: 3.5 mmol/L (ref 3.5–5.1)
Sodium: 137 mmol/L (ref 135–145)

## 2020-04-14 LAB — CBC
HCT: 33.2 % — ABNORMAL LOW (ref 36.0–46.0)
Hemoglobin: 11.4 g/dL — ABNORMAL LOW (ref 12.0–15.0)
MCH: 33.5 pg (ref 26.0–34.0)
MCHC: 34.3 g/dL (ref 30.0–36.0)
MCV: 97.6 fL (ref 80.0–100.0)
Platelets: 167 10*3/uL (ref 150–400)
RBC: 3.4 MIL/uL — ABNORMAL LOW (ref 3.87–5.11)
RDW: 13.2 % (ref 11.5–15.5)
WBC: 7.1 10*3/uL (ref 4.0–10.5)
nRBC: 0 % (ref 0.0–0.2)

## 2020-04-14 MED ORDER — FLEET ENEMA 7-19 GM/118ML RE ENEM: 1.0000 | ENEMA | RECTAL | Status: AC

## 2020-04-14 MED ORDER — BISACODYL 5 MG PO TBEC
10.0000 mg | DELAYED_RELEASE_TABLET | Freq: Every day | ORAL | Status: DC
  Administered 2020-04-14 – 2020-04-19 (×6): 10 mg via ORAL
  Filled 2020-04-14 (×6): qty 2

## 2020-04-14 MED ORDER — GLUCERNA SHAKE PO LIQD
237.0000 mL | Freq: Three times a day (TID) | ORAL | Status: DC
  Administered 2020-04-14 – 2020-04-19 (×11): 237 mL via ORAL

## 2020-04-14 MED ORDER — SENNOSIDES-DOCUSATE SODIUM 8.6-50 MG PO TABS
2.0000 | ORAL_TABLET | Freq: Two times a day (BID) | ORAL | Status: DC
  Administered 2020-04-14 – 2020-04-19 (×11): 2 via ORAL
  Filled 2020-04-14 (×11): qty 2

## 2020-04-14 MED ORDER — POLYETHYLENE GLYCOL 3350 17 G PO PACK
17.0000 g | PACK | Freq: Every day | ORAL | Status: DC
  Administered 2020-04-14 – 2020-04-19 (×6): 17 g via ORAL
  Filled 2020-04-14 (×6): qty 1

## 2020-04-14 NOTE — Evaluation (Signed)
Physical Therapy Evaluation Patient Details Name: Jodi GILLEAN MRN: [id number] DOB: 1928/03/12 Today's Date: 04/14/2020   History of Present Illness  Jodi Reeves is a 68yoF who comes to [facility] on 12/16 10 days of viral respiratory symptoms including sore throat, congestion, and some shortness of breath with exertion and mild cough. She also has had about 2 days of intermittent sharp and severe lower abdominal pain that is worse on the left, worse with ambulation. Imaging revealing of acute to subacute T12 compression fracture. PMH: hypoTSH, HTN, AF, gait unsteadiness, DM2, PND, CAD, PAD, UTI. Pt elected to go for kyphoplasty on 12/21 c Dr. Hessie Knows.  Clinical Impression  Pt reevaluated post T12 kyphoplasty, noted to have more pain this date 10/10 per report despite pre session  Analgesics. Pt repositioning in bed, as HGOB is flexing patient at T5 level, mostly cervical flexion. Pt able to partake in bed level leg exercises, needs minA for LLE, but on Right side does not tolerate much without pt beginning to cry in pain. Patient's performance this date reveals decreased ability, independence, and tolerance in performing all basic mobility required for performance of activities of daily living. Pt requires additional DME, close physical assistance, and cues for safe participate in mobility. Pt will benefit from skilled PT intervention to increase independence and safety with basic mobility in preparation for discharge to the venue listed below.        Follow Up Recommendations SNF;Supervision for mobility/OOB;Supervision - Intermittent    Equipment Recommendations  None recommended by PT    Recommendations for Other Services       Precautions / Restrictions Precautions Precautions: Fall Restrictions Weight Bearing Restrictions: Yes (WBAT)      Mobility  Bed Mobility Overal bed mobility: Needs Assistance             General bed mobility comments: pain too high to tolerate  attempt bu tdid make it to EOB with +1 assist for breakfast; Pt needs maxA+2 for scooting toward HOB.    Transfers                    Ambulation/Gait                Stairs            Wheelchair Mobility    Modified Rankin (Stroke Patients Only)       Balance                                             Pertinent Vitals/Pain Pain Assessment: 0-10 Pain Score: 10-Worst pain ever Pain Location: Low back to buttocks on Right, RLQ ABD Pain Descriptors / Indicators: Aching;Sharp Pain Intervention(s): Limited activity within patient's tolerance;Monitored during session;Premedicated before session;Repositioned    Home Living Family/patient expects to be discharged to:: Private residence Living Arrangements: Alone Available Help at Discharge: Family;Available PRN/intermittently Type of Home: House Home Access: Stairs to enter Entrance Stairs-Rails: Can reach both Entrance Stairs-Number of Steps: 5 Home Layout: Two level;Able to live on main level with bedroom/bathroom Home Equipment: Gilford Rile - 2 wheels;Cane - single point;Shower seat - built in Additional Comments: Reports 1 son nearby but works full time and travels for work    Prior Function Level of Independence: Independent with assistive device(s)   Gait / Transfers Assistance Needed: limits AMB to household due to balance issues. Uxses RW or  cane or walls in home.  ADL's / Homemaking Assistance Needed: Reports is scared of shower so sponge bathes mostly. Does not drive but if taken to grocery store uses cart to ambulate  Comments: Pt is retired Designer, jewellery. 2 falls with fractures previously, no falls or stumbles in 12 months.     Hand Dominance   Dominant Hand: Right    Extremity/Trunk Assessment   Upper Extremity Assessment Upper Extremity Assessment: Generalized weakness    Lower Extremity Assessment Lower Extremity Assessment: Generalized weakness        Communication   Communication: No difficulties  Cognition Arousal/Alertness: Awake/alert Behavior During Therapy: WFL for tasks assessed/performed Overall Cognitive Status: Within Functional Limits for tasks assessed                                        General Comments      Exercises General Exercises - Lower Extremity Short Arc Quad: AROM;Both;10 reps;Supine Heel Slides: AAROM;Both;10 reps;Supine;Limitations Heel Slides Limitations: RLE more painful, pt begins to cry, Rt limited to 5 reps Hip ABduction/ADduction: AAROM;Both;10 reps;Supine;Limitations Hip Abduction/Adduction Limitations: RLE more painful   Assessment/Plan    PT Assessment Patient needs continued PT services  PT Problem List Decreased strength;Decreased activity tolerance;Decreased mobility;Decreased balance       PT Treatment Interventions DME instruction;Gait training;Balance training;Functional mobility training;Therapeutic activities;Patient/family education;Therapeutic exercise    PT Goals (Current goals can be found in the Care Plan section)  Acute Rehab PT Goals Patient Stated Goal: To return home safely PT Goal Formulation: With patient Time For Goal Achievement: 04/28/20 Potential to Achieve Goals: Poor    Frequency 7X/week   Barriers to discharge Inaccessible home environment;Decreased caregiver support      Co-evaluation               AM-PAC PT "6 Clicks" Mobility  Outcome Measure Help needed turning from your back to your side while in a flat bed without using bedrails?: Total Help needed moving from lying on your back to sitting on the side of a flat bed without using bedrails?: Total Help needed moving to and from a bed to a chair (including a wheelchair)?: Total Help needed standing up from a chair using your arms (e.g., wheelchair or bedside chair)?: Total Help needed to walk in hospital room?: Total Help needed climbing 3-5 steps with a railing? : Total 6  Click Score: 6    End of Session   Activity Tolerance: Patient limited by pain Patient left: with call bell/phone within reach;in bed;with SCD's reapplied;Other (comment) (HOB at 35 degrees)   PT Visit Diagnosis: Difficulty in walking, not elsewhere classified (R26.2);Other abnormalities of gait and mobility (R26.89)    Time: [id number] PT Time Calculation (min) (ACUTE ONLY): 25 min   Charges:   PT Evaluation $PT Re-evaluation: 1 Re-eval PT Treatments $Therapeutic Exercise: 8-22 mins        12:24 PM, 04/14/20 Jodi Reeves, PT, DPT Physical Therapist - [facility]  [phone] ([place])    Jodi Reeves C 04/14/2020, 12:16 PM

## 2020-04-14 NOTE — TOC Progression Note (Addendum)
Transition of Care [facility]) - Progression Note    Patient Details  Name: Jodi Reeves MRN: [id number] Date of Birth: Feb 09, 1928  Transition of Care [facility]) CM/SW [place], LCSW Phone Number: 04/14/2020, 12:56 PM  Clinical Narrative: CSW provided patient with bed offers. She is deciding between [facility] and [facility]. CSW called her son to provide as well. They will discuss and call CSW with a decision before the end of the day.    5:18 pm: Patient and son are trying to decide between [facility] and [facility] and [facility] in [place]. Referral not going through on fax machine. CSW tried routing through [employer].  Expected Discharge Plan: [place] Barriers to Discharge: Continued Medical Work up,Insurance Authorization  Expected Discharge Plan and Services Expected Discharge Plan: [place] Choice: [place] arrangements for the past 2 months: Single Family Home                                       Social Determinants of Health (SDOH) Interventions    Readmission Risk Interventions No flowsheet data found.

## 2020-04-14 NOTE — Progress Notes (Signed)
at [place] NAME: Jodi Reeves    MR#:  [id number]  DATE OF BIRTH:  [birth year]  SUBJECTIVE:  CHIEF COMPLAINT:   Chief Complaint  Patient presents with  . Abdominal Pain  . Flank Pain  hurting in lower back especially around buttocks, trying for BM and requesting laxatives REVIEW OF SYSTEMS:  Review of Systems  Constitutional: Negative for diaphoresis, fever, malaise/fatigue and weight loss.  HENT: Negative for ear discharge, ear pain, hearing loss, nosebleeds, sore throat and tinnitus.   Eyes: Negative for blurred vision and pain.  Respiratory: Negative for cough, hemoptysis, shortness of breath and wheezing.   Cardiovascular: Negative for chest pain, palpitations, orthopnea and leg swelling.  Gastrointestinal: Positive for constipation. Negative for abdominal pain, blood in stool, diarrhea, heartburn, nausea and vomiting.  Genitourinary: Negative for dysuria, frequency and urgency.  Musculoskeletal: Positive for back pain. Negative for myalgias.  Skin: Negative for itching and rash.  Neurological: Negative for dizziness, tingling, tremors, focal weakness, seizures, weakness and headaches.  Psychiatric/Behavioral: Negative for depression. The patient is not nervous/anxious.    DRUG ALLERGIES:   Allergies  Allergen Reactions  . Amiodarone Other (See Comments)    Severe Thyroid issues   . Penicillins Anaphylaxis and Other (See Comments)    Has patient had a PCN reaction causing immediate rash, facial/tongue/throat swelling, SOB or lightheadedness with hypotension: Yes Has patient had a PCN reaction causing severe rash involving mucus membranes or skin necrosis: No Has patient had a PCN reaction that required hospitalization: No Has patient had a PCN reaction occurring within the last 10 years: No If all of the above answers are "NO", then may proceed with Cephalosporin use.   . Actos [Pioglitazone]     Not effective   . Amaryl [Glimepiride]      Not effective    . Atorvastatin Other (See Comments)    Muscle aches & All statins per Patient  . Bentyl [Dicyclomine Hcl]     Could not tolerate nausea, reduced concentration, h/a   . Ciprofloxacin Other (See Comments)    High blood pressure    . Codeine Nausea And Vomiting  . Ezetimibe-Simvastatin Other (See Comments)    Muscle aches, nausea, back pain   . Fosamax [Alendronate]     dysphagia  . Lovastatin Other (See Comments)    Myalgias  . Macrobid [Nitrofurantoin Macrocrystal]     Severe Itching, rash   . Protonix [Pantoprazole Sodium]     Esophageal problems    . Rosiglitazone Maleate Other (See Comments)    Edema  . Statins     Muscle and joint aches to all statins  . Victoza [Liraglutide]     Nausea   . Alendronate Sodium Rash    Dysphagia and ulceration   . Bactrim [Sulfamethoxazole-Trimethoprim] Rash    itching  . Sulfa Antibiotics Rash    Itching    VITALS:  Blood pressure (!) 165/98, pulse 95, temperature 98.4 F (36.9 C), temperature source Oral, resp. rate 16, height 5\' 3"  (1.6 m), weight 78.5 kg, SpO2 95 %. PHYSICAL EXAMINATION:  Physical Exam HENT:     Head: Normocephalic and atraumatic.  Eyes:     Extraocular Movements: EOM normal.     Conjunctiva/sclera: Conjunctivae normal.     Pupils: Pupils are equal, round, and reactive to light.  Neck:     Thyroid: No thyromegaly.     Trachea: No tracheal deviation.  Cardiovascular:     Rate and Rhythm: Normal rate  and regular rhythm.     Heart sounds: Normal heart sounds.  Pulmonary:     Effort: Pulmonary effort is normal. No respiratory distress.     Breath sounds: Normal breath sounds. No wheezing.  Chest:     Chest wall: No tenderness.  Abdominal:     General: Bowel sounds are normal. There is no distension.     Palpations: Abdomen is soft.     Tenderness: There is no abdominal tenderness.  Musculoskeletal:        General: Normal range of motion.     Cervical back: Normal range of motion  and neck supple.  Skin:    General: Skin is warm and dry.     Findings: No rash.  Neurological:     Mental Status: She is alert and oriented to person, place, and time.     Cranial Nerves: No cranial nerve deficit.    LABORATORY PANEL:  Female CBC Recent Labs  Lab 04/14/20 0503  WBC 7.1  HGB 11.4*  HCT 33.2*  PLT 167   ------------------------------------------------------------------------------------------------------------------ Chemistries  Recent Labs  Lab 04/08/20 2013 04/09/20 0837 04/14/20 0503  NA 136   < > 137  K 4.0   < > 3.5  CL 97*   < > 104  CO2 26   < > 24  GLUCOSE 204*   < > 186*  BUN 30*   < > 12  CREATININE 0.94   < > 0.46  CALCIUM 9.5   < > 8.1*  AST 33  --   --   ALT 11  --   --   ALKPHOS 69  --   --   BILITOT 1.1  --   --    < > = values in this interval not displayed.   RADIOLOGY:  No results found. ASSESSMENT AND PLAN:  86 year old female with a known history of hypothyroidism admitted s/p fall  T12 compression fracture Present on admission status post fall -S/p kyphoplasty on 12/21 -Pain medicine as needed  Constipation -Likely from pain medicine -I have started her on Senokot, MiraLAX and if still no response she can get Fleet enema -Likely reason for her buttock pain/abdominal pain  Hypokalemia Repleted and resolved  Hypothyroidism Check TSH, continue levothyroxine  Essential hypertension Controlled on current regimen  UTI Urine culture growing staph hemolyticus, less than 20,000 colonies Can stop doxycycline at discharge as patient is afebrile and has no leukocytosis at this point.  Persistent A. Fib Rate controlled on bisoprolol.  Resume Xarelto  Thrombocytopenia Stable platelets  Obesity Body mass index is 30.66 kg/m.      Status is: Inpatient  Remains inpatient appropriate because:Ongoing active pain requiring inpatient pain management   Dispo: The patient is from: Home              Anticipated d/c is  to: SNF              Anticipated d/c date is: 2 days              Patient currently is not medically stable to d/c.       DVT prophylaxis:       SCDs Start: 04/13/20 2026 Place TED hose Start: 04/09/20 0609 rivaroxaban (XARELTO) tablet 20 mg     Family Communication: ( "discussed with patient"), will update son/10 at [phone] tomorrow    All the records are reviewed and case discussed with Care Management/Social Worker. Management plans discussed with the patient, nursing and they are  in agreement.  CODE STATUS: Full Code  TOTAL TIME TAKING CARE OF THIS PATIENT: 35 minutes.   More than 50% of the time was spent in counseling/coordination of care: YES  POSSIBLE D/C IN 1-2 DAYS, DEPENDING ON CLINICAL CONDITION.   Max Sane M.D on 04/14/2020 at 7:40 PM  [facility]   CC: Primary care physician; [name], Nino Glow, MD  Note: This dictation was prepared with Dragon dictation along with smaller phrase technology. Any transcriptional errors that result from this process are unintentional.

## 2020-04-15 DIAGNOSIS — K5900 Constipation, unspecified: Secondary | ICD-10-CM

## 2020-04-15 LAB — BASIC METABOLIC PANEL
Anion gap: 4 — ABNORMAL LOW (ref 5–15)
BUN: 10 mg/dL (ref 8–23)
CO2: 28 mmol/L (ref 22–32)
Calcium: 8.1 mg/dL — ABNORMAL LOW (ref 8.9–10.3)
Chloride: 104 mmol/L (ref 98–111)
Creatinine, Ser: 0.48 mg/dL (ref 0.44–1.00)
GFR, Estimated: >60 mL/min (ref >60)
Glucose, Bld: 169 mg/dL — ABNORMAL HIGH (ref 70–99)
Potassium: 3.4 mmol/L — ABNORMAL LOW (ref 3.5–5.1)
Sodium: 136 mmol/L (ref 135–145)

## 2020-04-15 LAB — CBC
HCT: 32.8 % — ABNORMAL LOW (ref 36.0–46.0)
Hemoglobin: 11.5 g/dL — ABNORMAL LOW (ref 12.0–15.0)
MCH: 34.1 pg — ABNORMAL HIGH (ref 26.0–34.0)
MCHC: 35.1 g/dL (ref 30.0–36.0)
MCV: 97.3 fL (ref 80.0–100.0)
Platelets: 167 10*3/uL (ref 150–400)
RBC: 3.37 MIL/uL — ABNORMAL LOW (ref 3.87–5.11)
RDW: 13.2 % (ref 11.5–15.5)
WBC: 6.1 10*3/uL (ref 4.0–10.5)
nRBC: 0 % (ref 0.0–0.2)

## 2020-04-15 LAB — TSH: TSH: 4.306 u[IU]/mL (ref 0.350–4.500)

## 2020-04-15 MED ORDER — MORPHINE SULFATE (PF) 2 MG/ML IV SOLN
2.0000 mg | INTRAVENOUS | Status: DC | PRN
  Administered 2020-04-15 – 2020-04-18 (×5): 2 mg via INTRAVENOUS
  Filled 2020-04-15 (×5): qty 1

## 2020-04-15 MED ORDER — GLYCERIN (LAXATIVE) 2.1 G RE SUPP
1.0000 | Freq: Once | RECTAL | Status: AC
  Administered 2020-04-15: 1 via RECTAL
  Filled 2020-04-15: qty 1

## 2020-04-15 MED ORDER — POTASSIUM CHLORIDE CRYS ER 20 MEQ PO TBCR
40.0000 meq | EXTENDED_RELEASE_TABLET | Freq: Once | ORAL | Status: AC
  Administered 2020-04-15: 40 meq via ORAL
  Filled 2020-04-15: qty 2

## 2020-04-15 NOTE — Progress Notes (Signed)
Subjective: 2 Days Post-Op Procedure(s) (LRB): KYPHOPLASTY-T12 (N/A) Patient reports pain as 2 on 0-10 scale with lying down, Increased pain with movement  Patient is well, and has had no acute complaints or problems Denies any CP, SOB, ABD pain. + bm We will continue therapy today.   Objective: Vital signs in last 24 hours: Temp:  [97.8 F (36.6 C)-98.5 F (36.9 C)] 98.5 F (36.9 C) (12/23 0553) Pulse Rate:  [68-95] 85 (12/23 0553) Resp:  [16-20] 20 (12/23 0553) BP: (145-188)/(70-98) 159/86 (12/23 0553) SpO2:  [93 %-97 %] 94 % (12/23 0553)  Intake/Output from previous day: 12/22 0701 - 12/23 0700 In: 726.3 [P.O.:120; IV Piggyback:606.3] Out: 1100 [Urine:1100] Intake/Output this shift: No intake/output data recorded.  Recent Labs    04/13/20 0530 04/14/20 0503 04/15/20 0436  HGB 10.8* 11.4* 11.5*   Recent Labs    04/14/20 0503 04/15/20 0436  WBC 7.1 6.1  RBC 3.40* 3.37*  HCT 33.2* 32.8*  PLT 167 167   Recent Labs    04/14/20 0503 04/15/20 0436  NA 137 136  K 3.5 3.4*  CL 104 104  CO2 24 28  BUN 12 10  CREATININE 0.46 0.48  GLUCOSE 186* 169*  CALCIUM 8.1* 8.1*   No results for input(s): LABPT, INR in the last 72 hours.  EXAM General - Patient is Alert, Appropriate and Oriented Spine - non tender. Dressing CDI. NVI BLE Motor Function - intact, moving foot and toes well on exam.   Past Medical History:  Diagnosis Date   Allergy    Anxiety    Balance disorder    Chronic diastolic CHF (congestive heart failure) (HCC)    Colon polyps    Coronary artery disease, non-occlusive    a. LHC 11/2003: 10% LAD stenosis, 20% LCx stenosis, and 40% RCA stenosis; b. nuc stress test 12/15: small region of mild ischemia in the apical region with WMA also noted in the apical region, EF 60%. 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 CHADS2VASc = > 8 (CHF, HTN, age x 2, DM, TIA x 2, female); b. on Xarelto   SBO (small bowel obstruction) ([place])    1998   TIA (transient ischemic attack)    UTI (urinary tract infection)    Venous insufficiency     Assessment/Plan:   2 Days Post-Op Procedure(s) (LRB): KYPHOPLASTY-T12 (N/A) Principal Problem:   Abdominal pain Active Problems:   Hypothyroidism   Essential hypertension   Atrial fibrillation (HCC)   Gait instability   Diabetes, polyneuropathy (HCC)   Chronic diastolic CHF (congestive heart failure) (HCC)   Coronary artery disease, non-occlusive   Type 2 diabetes mellitus with diabetic polyneuropathy, without long-term current use of insulin (HCC)   PAD (peripheral artery disease) ([place])  UTI (urinary tract infection)  Estimated body mass index is 30.66 kg/m as calculated from the following:   Height as of this encounter: 5\' 3"  (1.6 m).   Weight as of this encounter: 78.5 kg. Pain improving. Patient was able to stand yesterday which she couldn't do prior to surgery. Continue with PT WBAT  Follow up with [facility] in 2 weeks    T. Rachelle Hora, PA-C [place] 04/15/2020, 7:15 AM

## 2020-04-15 NOTE — Progress Notes (Signed)
[facility] NAME: Jodi Reeves    MR#:  [id number]  DATE OF BIRTH:  [birth year]  SUBJECTIVE:  CHIEF COMPLAINT:   Chief Complaint  Patient presents with  . Abdominal Pain  . Flank Pain  Had small bowel movement last night but still feels like she needs to have good bowel movement and requesting glycerin suppositories.  Complaining of lower back pain 7 out of 10 and feels she will need morphine to get her pain under better control.  She has not been able to have a good sleep since first day when she received morphine. REVIEW OF SYSTEMS:  Review of Systems  Constitutional: Negative for diaphoresis, fever, malaise/fatigue and weight loss.  HENT: Negative for ear discharge, ear pain, hearing loss, nosebleeds, sore throat and tinnitus.   Eyes: Negative for blurred vision and pain.  Respiratory: Negative for cough, hemoptysis, shortness of breath and wheezing.   Cardiovascular: Negative for chest pain, palpitations, orthopnea and leg swelling.  Gastrointestinal: Positive for constipation. Negative for abdominal pain, blood in stool, diarrhea, heartburn, nausea and vomiting.  Genitourinary: Negative for dysuria, frequency and urgency.  Musculoskeletal: Positive for back pain. Negative for myalgias.  Skin: Negative for itching and rash.  Neurological: Negative for dizziness, tingling, tremors, focal weakness, seizures, weakness and headaches.  Psychiatric/Behavioral: Negative for depression. The patient is not nervous/anxious.    DRUG ALLERGIES:   Allergies  Allergen Reactions  . Amiodarone Other (See Comments)    Severe Thyroid issues   . Penicillins Anaphylaxis and Other (See Comments)    Has patient had a PCN reaction causing immediate rash, facial/tongue/throat swelling, SOB or lightheadedness with hypotension: Yes Has patient had a PCN reaction causing severe rash involving mucus membranes or skin necrosis: No Has patient had a PCN reaction that  required hospitalization: No Has patient had a PCN reaction occurring within the last 10 years: No If all of the above answers are "NO", then may proceed with Cephalosporin use.   . Actos [Pioglitazone]     Not effective   . Amaryl [Glimepiride]     Not effective    . Atorvastatin Other (See Comments)    Muscle aches & All statins per Patient  . Bentyl [Dicyclomine Hcl]     Could not tolerate nausea, reduced concentration, h/a   . Ciprofloxacin Other (See Comments)    High blood pressure    . Codeine Nausea And Vomiting  . Ezetimibe-Simvastatin Other (See Comments)    Muscle aches, nausea, back pain   . Fosamax [Alendronate]     dysphagia  . Lovastatin Other (See Comments)    Myalgias  . Macrobid [Nitrofurantoin Macrocrystal]     Severe Itching, rash   . Protonix [Pantoprazole Sodium]     Esophageal problems    . Rosiglitazone Maleate Other (See Comments)    Edema  . Statins     Muscle and joint aches to all statins  . Victoza [Liraglutide]     Nausea   . Alendronate Sodium Rash    Dysphagia and ulceration   . Bactrim [Sulfamethoxazole-Trimethoprim] Rash    itching  . Sulfa Antibiotics Rash    Itching    VITALS:  Blood pressure (!) 156/77, pulse 82, temperature 98.2 F (36.8 C), temperature source Oral, resp. rate 20, height 5\' 3"  (1.6 m), weight 78.5 kg, SpO2 93 %. PHYSICAL EXAMINATION:  Physical Exam HENT:     Head: Normocephalic and atraumatic.  Eyes:     Conjunctiva/sclera: Conjunctivae normal.  Pupils: Pupils are equal, round, and reactive to light.  Neck:     Thyroid: No thyromegaly.     Trachea: No tracheal deviation.  Cardiovascular:     Rate and Rhythm: Normal rate and regular rhythm.     Heart sounds: Normal heart sounds.  Pulmonary:     Effort: Pulmonary effort is normal. No respiratory distress.     Breath sounds: Normal breath sounds. No wheezing.  Chest:     Chest wall: No tenderness.  Abdominal:     General: Bowel sounds are normal.  There is no distension.     Palpations: Abdomen is soft.     Tenderness: There is no abdominal tenderness.  Musculoskeletal:        General: Normal range of motion.     Cervical back: Normal range of motion and neck supple.  Skin:    General: Skin is warm and dry.     Findings: No rash.  Neurological:     Mental Status: She is alert and oriented to person, place, and time.     Cranial Nerves: No cranial nerve deficit.    LABORATORY PANEL:  Female CBC Recent Labs  Lab 04/15/20 0436  WBC 6.1  HGB 11.5*  HCT 32.8*  PLT 167   ------------------------------------------------------------------------------------------------------------------ Chemistries  Recent Labs  Lab 04/08/20 2013 04/09/20 0837 04/15/20 0436  NA 136   < > 136  K 4.0   < > 3.4*  CL 97*   < > 104  CO2 26   < > 28  GLUCOSE 204*   < > 169*  BUN 30*   < > 10  CREATININE 0.94   < > 0.48  CALCIUM 9.5   < > 8.1*  AST 33  --   --   ALT 11  --   --   ALKPHOS 69  --   --   BILITOT 1.1  --   --    < > = values in this interval not displayed.   RADIOLOGY:  No results found. ASSESSMENT AND PLAN:  86 year old female with a known history of hypothyroidism admitted s/p fall  T12 compression fracture Present on admission status post fall -S/p kyphoplasty on 12/21 -Pain medicine as needed.  Add morphine for better pain control for severe pain per patient request  Constipation -Likely from pain medicine -Continue Senokot, MiraLAX and add glycerin suppository per patient request -Likely reason for her buttock pain/abdominal pain  Hypokalemia Repleted and resolved  Hypothyroidism Check TSH, continue levothyroxine  Essential hypertension Controlled on current regimen  UTI Urine culture growing staph hemolyticus, less than 20,000 colonies Stop doxycycline  Persistent A. Fib Rate controlled on bisoprolol. Xarelto for anticoagulation  Thrombocytopenia Stable platelets  Obesity Body mass index is  30.66 kg/m.      Status is: Inpatient  Remains inpatient appropriate because:Ongoing active pain requiring inpatient pain management   Dispo: The patient is from: Home              Anticipated d/c is to: SNF              Anticipated d/c date is: 2 days              Patient currently is not medically stable to d/c.  Whenever insurance Auth arrives.  She has accepted [employer].       DVT prophylaxis:       SCDs Start: 04/13/20 2026 Place TED hose Start: 04/09/20 0609 rivaroxaban (XARELTO) tablet 20 mg  Family Communication:  update son tim at [phone] on 12/23   All the records are reviewed and case discussed with Care Management/Social Worker. Management plans discussed with the patient, nursing and they are in agreement.  CODE STATUS: Full Code  TOTAL TIME TAKING CARE OF THIS PATIENT: 35 minutes.   More than 50% of the time was spent in counseling/coordination of care: YES  POSSIBLE D/C IN 1-2 DAYS, DEPENDING ON CLINICAL CONDITION.   Max Sane M.D on 04/15/2020 at 2:58 PM  [facility]   CC: Primary care physician; [name], Nino Glow, MD  Note: This dictation was prepared with Dragon dictation along with smaller phrase technology. Any transcriptional errors that result from this process are unintentional.

## 2020-04-15 NOTE — Evaluation (Signed)
Occupational Therapy Re-Evaluation Patient Details Name: Jodi Reeves MRN: [id number] DOB: 26-Apr-1927 Today's Date: 04/15/2020    History of Present Illness Jodi Reeves is a 84yoF who comes [facility] on 12/16 10 days of viral respiratory symptoms including sore throat, congestion, and some shortness of breath with exertion and mild cough. She also has had about 2 days of intermittent sharp and severe lower abdominal pain that is worse on the left, worse with ambulation. Imaging revealing of acute to subacute T12 compression fracture. PMH: hypoTSH, HTN, AF, gait unsteadiness, DM2, PND, CAD, PAD, UTI. Pt elected to go for kyphoplasty on 12/21 c Dr. Hessie Knows.   Clinical Impression   Pt seen for re-evaluation s/p T12 kyphoplasty. Pt agreeable to OT intervention and is alert and oriented. Pt performing bed mobility and self care tasks with increased time and cuing for safety. OT educated pt on back precautions for comfort with mobility task and self care. Pt performing log roll to supine with max A. While seated on EOB, pt performed figure four position with B LEs but needing assistance to thread mesh brief on B feet and to pull over hips in standing. Standing with mod lifting assistance and use of RW. Pt needing min - mod multimodal cuing for hand placement and technique. Pt standing at sink for ~7 minutes for self care tasks at sink with min A for standing balance. Pt taking side steps to the L with RW and min A. Pt returning to bed with mod A for B LEs to return to supine. Goals remain appropriate for pt and OT continues to recommend SNF at discharge.      Follow Up Recommendations  SNF    Equipment Recommendations  Other (comment) (defer to next venue of care)       Precautions / Restrictions Precautions Precautions: Fall Precaution Comments: back precautions for comfort Restrictions Weight Bearing Restrictions: Yes RLE Weight Bearing: Weight bearing as tolerated LLE Weight Bearing:  Weight bearing as tolerated      Mobility Bed Mobility Overal bed mobility: Needs Assistance Bed Mobility: Supine to Sit;Sit to Supine     Supine to sit: Max assist Sit to supine: Mod assist   General bed mobility comments: Pt needing assistance for trunk and B LEs with log roll technique for pain management supine>sit. Pt required assist with B LEs sit >supine with min cuing for technique.    Transfers Overall transfer level: Needs assistance Equipment used: Rolling walker (2 wheeled) Transfers: Sit to/from [facility] Sit to Stand: Mod assist Stand pivot transfers: Min guard;From elevated surface       General transfer comment: Pt needing mod lifting assistance to stand from standard bed height with mod multimodal cuing for upright position and hand placement    Balance Overall balance assessment: Needs assistance Sitting-balance support: No upper extremity supported;Feet supported;Feet unsupported Sitting balance-Leahy Scale: Good     Standing balance support: Bilateral upper extremity supported;During functional activity Standing balance-Leahy Scale: Poor Standing balance comment: posterior lean in standing but improved during session                           ADL either performed or assessed with clinical judgement   ADL Overall ADL's : Needs assistance/impaired     Grooming: Wash/dry hands;Oral care;Cueing for safety;Sitting;Minimal assistance               Lower Body Dressing: Maximal assistance;Sit to/from stand  Vision Baseline Vision/History: Wears glasses Wears Glasses: At all times Patient Visual Report: No change from baseline              Pertinent Vitals/Pain Pain Assessment: 0-10 Pain Score: 4  Pain Location: buttocks and abdomen Pain Descriptors / Indicators: Aching;Discomfort;Dull Pain Intervention(s): Limited activity within patient's tolerance;Monitored during  session;Premedicated before session;Repositioned           Communication     Cognition Arousal/Alertness: Awake/alert Behavior During Therapy: WFL for tasks assessed/performed Overall Cognitive Status: Within Functional Limits for tasks assessed                                                Home Living Family/patient expects to be discharged to:: Private residence Living Arrangements: Alone Available Help at Discharge: Family;Available PRN/intermittently Type of Home: House Home Access: Stairs to enter                                 OT Goals(Current goals can be found in the care plan section) Acute Rehab OT Goals Patient Stated Goal: to decrease pain and move better OT Goal Formulation: With patient Time For Goal Achievement: 04/29/20 Potential to Achieve Goals: Good  OT Frequency: Min 2X/week   Barriers to D/C:            Co-evaluation              AM-PAC OT "6 Clicks" Daily Activity     Outcome Measure Help from another person eating meals?: None Help from another person taking care of personal grooming?: A Little Help from another person toileting, which includes using toliet, bedpan, or urinal?: A Lot Help from another person bathing (including washing, rinsing, drying)?: A Lot Help from another person to put on and taking off regular upper body clothing?: None Help from another person to put on and taking off regular lower body clothing?: A Lot 6 Click Score: 17   End of Session Equipment Utilized During Treatment: Rolling walker Nurse Communication: Mobility status  Activity Tolerance: Patient tolerated treatment well Patient left: in bed;with call bell/phone within reach;with bed alarm set;Other (comment) (MD present)  OT Visit Diagnosis: Other abnormalities of gait and mobility (R26.89);Muscle weakness (generalized) (M62.81)                Time: 0867-6195 OT Time Calculation (min): 39 min Charges:  OT General  Charges $OT Visit: 1 Visit OT Evaluation $OT Re-eval: 1 Re-eval OT Treatments $Self Care/Home Management : 23-37 mins  Darleen Crocker, MS, OTR/L , CBIS ascom [phone]  04/15/20, 11:18 AM

## 2020-04-15 NOTE — TOC Progression Note (Addendum)
Transition of Care [facility]) - Progression Note    Patient Details  Name: Jodi Reeves MRN: [id number] Date of Birth: March 30, 1928  [facility] of [facility]) CM/SW Contact  Beverly Sessions, RN Phone Number: 04/15/2020, 2:01 PM  Clinical Narrative:    Damaris Schooner with Otila Kluver at [facility] they are able to offer   Discussed offers with patient at bedside and son on the phone.  They have accepted offer at [facility].  Magda Paganini at [place] starting insurance auth    Expected Discharge Plan: [place] Barriers to Discharge: Continued Medical Work up,Insurance Authorization  Expected Discharge Plan and Services Expected Discharge Plan: [place] Choice: [place] arrangements for the past 2 months: Single Family Home                                       Social Determinants of Health (SDOH) Interventions    Readmission Risk Interventions No flowsheet data found.

## 2020-04-15 NOTE — Progress Notes (Signed)
Physical Therapy Treatment Patient Details Name: LAYNEY GILLSON MRN: [id number] DOB: 11/13/1927 Today's Date: 04/15/2020    History of Present Illness Sheralyn Pinegar is a 46yoF who comes to [facility] on 12/16 10 days of viral respiratory symptoms including sore throat, congestion, and some shortness of breath with exertion and mild cough. She also has had about 2 days of intermittent sharp and severe lower abdominal pain that is worse on the left, worse with ambulation. Imaging revealing of acute to subacute T12 compression fracture. PMH: hypoTSH, HTN, AF, gait unsteadiness, DM2, PND, CAD, PAD, UTI. Pt elected to go for kyphoplasty on 12/21 c Dr. Hessie Knows.    PT Comments    Pt is making good progress towards goals with ability to ambulate to recliner. Educated and performed log roll technique. HEP reviewed and performed. Appears motivated, however limited by pain.   Follow Up Recommendations  SNF;Supervision for mobility/OOB;Supervision - Intermittent     Equipment Recommendations  None recommended by PT    Recommendations for Other Services       Precautions / Restrictions Precautions Precautions: Fall Precaution Comments: back precautions for comfort Restrictions Weight Bearing Restrictions: Yes RLE Weight Bearing: Weight bearing as tolerated LLE Weight Bearing: Weight bearing as tolerated    Mobility  Bed Mobility Overal bed mobility: Needs Assistance Bed Mobility: Supine to Sit;Sit to Supine     Supine to sit: Min assist     General bed mobility comments: safe technique with log roll performed. Very slow movement. Upright posture  Transfers Overall transfer level: Needs assistance Equipment used: Rolling walker (2 wheeled) Transfers: Sit to/from Stand Sit to Stand: Min assist         General transfer comment: safe technique with upright posture. Able to push from seated surface.  Ambulation/Gait Ambulation/Gait assistance: Min assist Gait Distance (Feet): 5  Feet Assistive device: Rolling walker (2 wheeled) Gait Pattern/deviations: Step-to pattern     General Gait Details: ambulated to recliner with safe technique. Slow step to gait pattern.   Stairs             Wheelchair Mobility    Modified Rankin (Stroke Patients Only)       Balance Overall balance assessment: Needs assistance Sitting-balance support: No upper extremity supported;Feet supported;Feet unsupported Sitting balance-Leahy Scale: Good     Standing balance support: Bilateral upper extremity supported;During functional activity Standing balance-Leahy Scale: Fair                              Cognition Arousal/Alertness: Awake/alert Behavior During Therapy: WFL for tasks assessed/performed Overall Cognitive Status: Within Functional Limits for tasks assessed                                        Exercises Other Exercises Other Exercises: supine ther-ex performed on B LE including quad sets, SLRs, hip abd/add, hip add squeezes, and SAQ. All ther-ex performed x 10 reps and safe technique.Min assist required    General Comments        Pertinent Vitals/Pain Pain Assessment: 0-10 Pain Score: 6  Pain Location: abdomen Pain Descriptors / Indicators: Aching Pain Intervention(s): Limited activity within patient's tolerance;Premedicated before session    Home Living                      Prior Function  PT Goals (current goals can now be found in the care plan section) Acute Rehab PT Goals Patient Stated Goal: to decrease pain and move better PT Goal Formulation: With patient Time For Goal Achievement: 04/28/20 Potential to Achieve Goals: Fair Progress towards PT goals: Progressing toward goals    Frequency    7X/week      PT Plan Current plan remains appropriate    Co-evaluation              AM-PAC PT "6 Clicks" Mobility   Outcome Measure  Help needed turning from your back to your  side while in a flat bed without using bedrails?: A Little Help needed moving from lying on your back to sitting on the side of a flat bed without using bedrails?: A Little Help needed moving to and from a bed to a chair (including a wheelchair)?: A Little Help needed standing up from a chair using your arms (e.g., wheelchair or bedside chair)?: A Lot Help needed to walk in hospital room?: A Lot Help needed climbing 3-5 steps with a railing? : Total 6 Click Score: 14    End of Session Equipment Utilized During Treatment: Gait belt Activity Tolerance: Patient limited by pain Patient left: in chair;with chair alarm set Nurse Communication: Mobility status PT Visit Diagnosis: Difficulty in walking, not elsewhere classified (R26.2);Other abnormalities of gait and mobility (R26.89)     Time: [id number] PT Time Calculation (min) (ACUTE ONLY): 38 min  Charges:  $Gait Training: 8-22 mins $Therapeutic Exercise: 23-37 mins                     Greggory Stallion, [place], DPT [phone]    Zandria Woldt 04/15/2020, 4:33 PM

## 2020-04-16 LAB — CBC
HCT: 33.8 % — ABNORMAL LOW (ref 36.0–46.0)
Hemoglobin: 11.7 g/dL — ABNORMAL LOW (ref 12.0–15.0)
MCH: 34 pg (ref 26.0–34.0)
MCHC: 34.6 g/dL (ref 30.0–36.0)
MCV: 98.3 fL (ref 80.0–100.0)
Platelets: 178 10*3/uL (ref 150–400)
RBC: 3.44 MIL/uL — ABNORMAL LOW (ref 3.87–5.11)
RDW: 13.2 % (ref 11.5–15.5)
WBC: 6.4 10*3/uL (ref 4.0–10.5)
nRBC: 0 % (ref 0.0–0.2)

## 2020-04-16 LAB — BASIC METABOLIC PANEL
Anion gap: 8 (ref 5–15)
BUN: 12 mg/dL (ref 8–23)
CO2: 28 mmol/L (ref 22–32)
Calcium: 8.4 mg/dL — ABNORMAL LOW (ref 8.9–10.3)
Chloride: 104 mmol/L (ref 98–111)
Creatinine, Ser: 0.54 mg/dL (ref 0.44–1.00)
GFR, Estimated: >60 mL/min (ref >60)
Glucose, Bld: 163 mg/dL — ABNORMAL HIGH (ref 70–99)
Potassium: 3.3 mmol/L — ABNORMAL LOW (ref 3.5–5.1)
Sodium: 140 mmol/L (ref 135–145)

## 2020-04-16 MED ORDER — ACETAMINOPHEN 325 MG PO TABS
650.0000 mg | ORAL_TABLET | Freq: Four times a day (QID) | ORAL | Status: DC | PRN
  Administered 2020-04-17: 650 mg via ORAL
  Filled 2020-04-16: qty 2

## 2020-04-16 MED ORDER — ACETAMINOPHEN 650 MG RE SUPP: 650.0000 mg | Freq: Four times a day (QID) | RECTAL | Status: DC | PRN

## 2020-04-16 MED ORDER — FLEET ENEMA 7-19 GM/118ML RE ENEM
1.0000 | ENEMA | RECTAL | Status: AC
  Administered 2020-04-16: 1 via RECTAL

## 2020-04-16 MED ORDER — OXYCODONE-ACETAMINOPHEN 5-325 MG PO TABS
1.0000 | ORAL_TABLET | ORAL | Status: DC | PRN
  Administered 2020-04-17 – 2020-04-19 (×6): 1 via ORAL
  Filled 2020-04-16 (×6): qty 1

## 2020-04-16 MED ORDER — POTASSIUM CHLORIDE CRYS ER 20 MEQ PO TBCR
40.0000 meq | EXTENDED_RELEASE_TABLET | Freq: Once | ORAL | Status: AC
  Administered 2020-04-16: 40 meq via ORAL
  Filled 2020-04-16: qty 2

## 2020-04-16 NOTE — Progress Notes (Signed)
Physical Therapy Treatment Patient Details Name: Jodi Reeves MRN: [id number] DOB: July 20, 1927 Today's Date: 04/16/2020    History of Present Illness Jodi Reeves is a 26yoF who comes to [facility] on 12/16 10 days of viral respiratory symptoms including sore throat, congestion, and some shortness of breath with exertion and mild cough. She also has had about 2 days of intermittent sharp and severe lower abdominal pain that is worse on the left, worse with ambulation. Imaging revealing of acute to subacute T12 compression fracture. PMH: hypoTSH, HTN, AF, gait unsteadiness, DM2, PND, CAD, PAD, UTI. Pt elected to go for kyphoplasty on 12/21 c Dr. Hessie Knows.    PT Comments    Pt was long sitting in bed upon arriving. She agrees to OOB activity and is cooperative throughout. extremely limited by pain. She requires a lot of increased time to perform  mobility/transfers/gait 2/2 to pain. Performed log roll technique to right> short sit. Sat EOB x several minutes prior to standing to RW a few time and taking steps to recliner. Pt was in A fib throughout with HR elevation to 125 at its peak. BP 203/95 once sitting in chair. RN aware. Recommend continued skilled PT at DC  to progress strength and safety with all ADLs. At conclusion of session, pt was in recliner with chair alarm in place, call bell in reach, and RN aware of limitations. Will continue to follow per POC.    Follow Up Recommendations  SNF;Supervision for mobility/OOB;Supervision - Intermittent     Equipment Recommendations  None recommended by PT    Recommendations for Other Services       Precautions / Restrictions Precautions Precautions: Fall Precaution Comments: back precautions for comfort Restrictions Weight Bearing Restrictions: No RLE Weight Bearing: Weight bearing as tolerated LLE Weight Bearing: Weight bearing as tolerated    Mobility  Bed Mobility Overal bed mobility: Needs Assistance Bed Mobility:  Rolling;Sidelying to Sit;Supine to Sit Rolling: Min assist;Mod assist Sidelying to sit: Mod assist Supine to sit: Mod assist     General bed mobility comments: Increased time to perform 2/2 to pain. did perform with bed height flat  Transfers Overall transfer level: Needs assistance Equipment used: Rolling walker (2 wheeled) Transfers: Sit to/from Stand Sit to Stand: Min assist         General transfer comment: Pt was able to stand 3 x EOB to RW with min assist. very pain limited  Ambulation/Gait Ambulation/Gait assistance: Min assist Gait Distance (Feet): 5 Feet Assistive device: Rolling walker (2 wheeled) Gait Pattern/deviations: Step-to pattern Gait velocity: decreased   General Gait Details: ambulated to recliner with safe technique. Slow step to gait pattern.       Balance Overall balance assessment: Needs assistance Sitting-balance support: No upper extremity supported;Feet supported;Feet unsupported Sitting balance-Leahy Scale: Good     Standing balance support: Bilateral upper extremity supported;During functional activity Standing balance-Leahy Scale: Fair Standing balance comment: heavy UE support 2/2 to pain      Cognition Arousal/Alertness: Awake/alert Behavior During Therapy: WFL for tasks assessed/performed Overall Cognitive Status: Within Functional Limits for tasks assessed      General Comments: Pt is A and O and very pleasant/ session limited by pain. MD arrived during session and states she can continue with taking morphine             Pertinent Vitals/Pain Pain Assessment: 0-10 Pain Score: 9  Pain Location: abdomen/ Back Pain Descriptors / Indicators: Constant;Discomfort;Penetrating;Guarding Pain Intervention(s): Limited activity within patient's tolerance;Monitored during session;Premedicated before  session;Repositioned           PT Goals (current goals can now be found in the care plan section) Acute Rehab PT Goals Patient Stated  Goal: to decrease pain and move better Progress towards PT goals: Progressing toward goals    Frequency    7X/week      PT Plan Current plan remains appropriate       AM-PAC PT "6 Clicks" Mobility   Outcome Measure  Help needed turning from your back to your side while in a flat bed without using bedrails?: A Little Help needed moving from lying on your back to sitting on the side of a flat bed without using bedrails?: A Little Help needed moving to and from a bed to a chair (including a wheelchair)?: A Little Help needed standing up from a chair using your arms (e.g., wheelchair or bedside chair)?: A Lot Help needed to walk in hospital room?: A Lot Help needed climbing 3-5 steps with a railing? : Total 6 Click Score: 14    End of Session Equipment Utilized During Treatment: Gait belt Activity Tolerance: Patient limited by pain Patient left: in chair;with chair alarm set Nurse Communication: Mobility status PT Visit Diagnosis: Difficulty in walking, not elsewhere classified (R26.2);Other abnormalities of gait and mobility (R26.89)     Time: 0539-7673 PT Time Calculation (min) (ACUTE ONLY): 23 min  Charges:  $Therapeutic Activity: 23-37 mins                     Julaine Fusi PTA 04/16/20, 10:03 AM

## 2020-04-16 NOTE — TOC Progression Note (Signed)
Transition of Care [facility]) - Progression Note    Patient Details  Name: ELISIA STEPP MRN: [id number] Date of Birth: [birth year]  Transition of Care [facility]) CM/SW Contact  Beverly Sessions, RN Phone Number: 04/16/2020, 10:29 AM  Clinical Narrative:    Additional PT notes sent to [facility] at [facility]    Expected Discharge Plan: [place] Barriers to Discharge: Continued Medical Work up,Insurance Authorization  Expected Discharge Plan and Services Expected Discharge Plan: [place] Choice: [place] arrangements for the past 2 months: Single Family Home                                       Social Determinants of Health (SDOH) Interventions    Readmission Risk Interventions No flowsheet data found.

## 2020-04-16 NOTE — Care Management Important Message (Signed)
Important Message  Patient Details  Name: Jodi Reeves MRN: [id number] Date of Birth: [birth year]   Medicare Important Message Given:  Yes     [name] 04/16/2020, 11:40 AM

## 2020-04-16 NOTE — Progress Notes (Signed)
[place] at [place] NAME: Jodi Reeves    MR#:  [id number]  DATE OF BIRTH:  20-Jun-1927  SUBJECTIVE:  CHIEF COMPLAINT:   Chief Complaint  Patient presents with  . Abdominal Pain  . Flank Pain  Not feeling well.  Sitting in the chair.  Not able to sleep well last night's.  Requesting enema today and adjust her pain medicine REVIEW OF SYSTEMS:  Review of Systems  Constitutional: Negative for diaphoresis, fever, malaise/fatigue and weight loss.  HENT: Negative for ear discharge, ear pain, hearing loss, nosebleeds, sore throat and tinnitus.   Eyes: Negative for blurred vision and pain.  Respiratory: Negative for cough, hemoptysis, shortness of breath and wheezing.   Cardiovascular: Negative for chest pain, palpitations, orthopnea and leg swelling.  Gastrointestinal: Positive for constipation. Negative for abdominal pain, blood in stool, diarrhea, heartburn, nausea and vomiting.  Genitourinary: Negative for dysuria, frequency and urgency.  Musculoskeletal: Positive for back pain. Negative for myalgias.  Skin: Negative for itching and rash.  Neurological: Negative for dizziness, tingling, tremors, focal weakness, seizures, weakness and headaches.  Psychiatric/Behavioral: Negative for depression. The patient is not nervous/anxious.    DRUG ALLERGIES:   Allergies  Allergen Reactions  . Amiodarone Other (See Comments)    Severe Thyroid issues   . Penicillins Anaphylaxis and Other (See Comments)    Has patient had a PCN reaction causing immediate rash, facial/tongue/throat swelling, SOB or lightheadedness with hypotension: Yes Has patient had a PCN reaction causing severe rash involving mucus membranes or skin necrosis: No Has patient had a PCN reaction that required hospitalization: No Has patient had a PCN reaction occurring within the last 10 years: No If all of the above answers are "NO", then may proceed with Cephalosporin use.   . Actos [Pioglitazone]      Not effective   . Amaryl [Glimepiride]     Not effective    . Atorvastatin Other (See Comments)    Muscle aches & All statins per Patient  . Bentyl [Dicyclomine Hcl]     Could not tolerate nausea, reduced concentration, h/a   . Ciprofloxacin Other (See Comments)    High blood pressure    . Codeine Nausea And Vomiting  . Ezetimibe-Simvastatin Other (See Comments)    Muscle aches, nausea, back pain   . Fosamax [Alendronate]     dysphagia  . Lovastatin Other (See Comments)    Myalgias  . Macrobid [Nitrofurantoin Macrocrystal]     Severe Itching, rash   . Protonix [Pantoprazole Sodium]     Esophageal problems    . Rosiglitazone Maleate Other (See Comments)    Edema  . Statins     Muscle and joint aches to all statins  . Victoza [Liraglutide]     Nausea   . Alendronate Sodium Rash    Dysphagia and ulceration   . Bactrim [Sulfamethoxazole-Trimethoprim] Rash    itching  . Sulfa Antibiotics Rash    Itching    VITALS:  Blood pressure (!) 139/92, pulse 99, temperature 97.6 F (36.4 C), temperature source Axillary, resp. rate 20, height 5\' 3"  (1.6 m), weight 78.5 kg, SpO2 98 %. PHYSICAL EXAMINATION:  Physical Exam HENT:     Head: Normocephalic and atraumatic.  Eyes:     Conjunctiva/sclera: Conjunctivae normal.     Pupils: Pupils are equal, round, and reactive to light.  Neck:     Thyroid: No thyromegaly.     Trachea: No tracheal deviation.  Cardiovascular:     Rate  and Rhythm: Normal rate and regular rhythm.     Heart sounds: Normal heart sounds.  Pulmonary:     Effort: Pulmonary effort is normal. No respiratory distress.     Breath sounds: Normal breath sounds. No wheezing.  Chest:     Chest wall: No tenderness.  Abdominal:     General: Bowel sounds are normal. There is no distension.     Palpations: Abdomen is soft.     Tenderness: There is no abdominal tenderness.  Musculoskeletal:        General: Normal range of motion.     Cervical back: Normal range  of motion and neck supple.  Skin:    General: Skin is warm and dry.     Findings: No rash.  Neurological:     Mental Status: She is alert and oriented to person, place, and time.     Cranial Nerves: No cranial nerve deficit.    LABORATORY PANEL:  Female CBC Recent Labs  Lab 04/16/20 0436  WBC 6.4  HGB 11.7*  HCT 33.8*  PLT 178   ------------------------------------------------------------------------------------------------------------------ Chemistries  Recent Labs  Lab 04/16/20 0436  NA 140  K 3.3*  CL 104  CO2 28  GLUCOSE 163*  BUN 12  CREATININE 0.54  CALCIUM 8.4*   RADIOLOGY:  No results found. ASSESSMENT AND PLAN:  86 year old female with a known history of hypothyroidism admitted s/p fall  T12 compression fracture Present on admission status post fall -S/p kyphoplasty on 12/21 -Pain medicine as needed.  Added morphine for better pain control for severe pain per patient request We will add Percocet for moderate pain control history of tramadol as she does not feel tramadol is working for her  Constipation -Likely from pain medicine -Continue Senokot, MiraLAX and add Fleet enema -Likely reason for her buttock pain/abdominal pain  Hypokalemia Repleted and resolved  Hypothyroidism Normal TSH, continue levothyroxine  Essential hypertension Controlled on current regimen  UTI Urine culture growing staph hemolyticus, less than 20,000 colonies Stop doxycycline  Persistent A. Fib Rate controlled on bisoprolol. Xarelto for anticoagulation, patient requesting to be given this at night which is okay for me  Thrombocytopenia Stable platelets  Obesity Body mass index is 30.66 kg/m.      Status is: Inpatient  Remains inpatient appropriate because:Ongoing active pain requiring inpatient pain management   Dispo: The patient is from: Home              Anticipated d/c is to: SNF              Anticipated d/c date is: 2 days              Patient  currently is not medically stable to d/c.  Whenever insurance Auth arrives.  She has accepted [employer].       DVT prophylaxis:       SCDs Start: 04/13/20 2026 Place TED hose Start: 04/09/20 0609 rivaroxaban (XARELTO) tablet 20 mg     Family Communication:  update son tim at [phone] on 12/23   All the records are reviewed and case discussed with Care Management/Social Worker. Management plans discussed with the patient, nursing and they are in agreement.  CODE STATUS: Full Code  TOTAL TIME TAKING CARE OF THIS PATIENT: 35 minutes.   More than 50% of the time was spent in counseling/coordination of care: YES  POSSIBLE D/C IN 1-2 DAYS, DEPENDING ON CLINICAL CONDITION.   Max Sane M.D on 04/16/2020 at 3:21 PM  [facility]  CC: Primary care physician; [name], Nino Glow, MD  Note: This dictation was prepared with Dragon dictation along with smaller phrase technology. Any transcriptional errors that result from this process are unintentional.

## 2020-04-17 DIAGNOSIS — I739 Peripheral vascular disease, unspecified: Secondary | ICD-10-CM

## 2020-04-17 LAB — CBC
HCT: 33.7 % — ABNORMAL LOW (ref 36.0–46.0)
Hemoglobin: 11.5 g/dL — ABNORMAL LOW (ref 12.0–15.0)
MCH: 33.5 pg (ref 26.0–34.0)
MCHC: 34.1 g/dL (ref 30.0–36.0)
MCV: 98.3 fL (ref 80.0–100.0)
Platelets: 184 10*3/uL (ref 150–400)
RBC: 3.43 MIL/uL — ABNORMAL LOW (ref 3.87–5.11)
RDW: 13.2 % (ref 11.5–15.5)
WBC: 6.6 10*3/uL (ref 4.0–10.5)
nRBC: 0 % (ref 0.0–0.2)

## 2020-04-17 LAB — BASIC METABOLIC PANEL
Anion gap: 9 (ref 5–15)
BUN: 13 mg/dL (ref 8–23)
CO2: 28 mmol/L (ref 22–32)
Calcium: 8.2 mg/dL — ABNORMAL LOW (ref 8.9–10.3)
Chloride: 102 mmol/L (ref 98–111)
Creatinine, Ser: 0.5 mg/dL (ref 0.44–1.00)
GFR, Estimated: >60 mL/min (ref >60)
Glucose, Bld: 165 mg/dL — ABNORMAL HIGH (ref 70–99)
Potassium: 3.3 mmol/L — ABNORMAL LOW (ref 3.5–5.1)
Sodium: 139 mmol/L (ref 135–145)

## 2020-04-17 MED ORDER — POTASSIUM CHLORIDE CRYS ER 20 MEQ PO TBCR
40.0000 meq | EXTENDED_RELEASE_TABLET | ORAL | Status: AC
Start: 2020-04-17 — End: 2020-04-17
  Administered 2020-04-17 (×2): 40 meq via ORAL
  Filled 2020-04-17 (×2): qty 2

## 2020-04-17 NOTE — Progress Notes (Signed)
[place] at [place] NAME: Jodi Reeves    MR#:  [id number]  DATE OF BIRTH:  1927/07/04  SUBJECTIVE:  CHIEF COMPLAINT:   Chief Complaint  Patient presents with  . Abdominal Pain  . Flank Pain  Feeling better today.  Was able to sleep overnight.  Had small bowel movement with Fleet enema yesterday which helped REVIEW OF SYSTEMS:  Review of Systems  Constitutional: Negative for diaphoresis, fever, malaise/fatigue and weight loss.  HENT: Negative for ear discharge, ear pain, hearing loss, nosebleeds, sore throat and tinnitus.   Eyes: Negative for blurred vision and pain.  Respiratory: Negative for cough, hemoptysis, shortness of breath and wheezing.   Cardiovascular: Negative for chest pain, palpitations, orthopnea and leg swelling.  Gastrointestinal: Negative for abdominal pain, blood in stool, diarrhea, heartburn, nausea and vomiting.  Genitourinary: Negative for dysuria, frequency and urgency.  Musculoskeletal: Positive for back pain. Negative for myalgias.  Skin: Negative for itching and rash.  Neurological: Negative for dizziness, tingling, tremors, focal weakness, seizures, weakness and headaches.  Psychiatric/Behavioral: Negative for depression. The patient is not nervous/anxious.    DRUG ALLERGIES:   Allergies  Allergen Reactions  . Amiodarone Other (See Comments)    Severe Thyroid issues   . Penicillins Anaphylaxis and Other (See Comments)    Has patient had a PCN reaction causing immediate rash, facial/tongue/throat swelling, SOB or lightheadedness with hypotension: Yes Has patient had a PCN reaction causing severe rash involving mucus membranes or skin necrosis: No Has patient had a PCN reaction that required hospitalization: No Has patient had a PCN reaction occurring within the last 10 years: No If all of the above answers are "NO", then may proceed with Cephalosporin use.   . Actos [Pioglitazone]     Not effective   . Amaryl  [Glimepiride]     Not effective    . Atorvastatin Other (See Comments)    Muscle aches & All statins per Patient  . Bentyl [Dicyclomine Hcl]     Could not tolerate nausea, reduced concentration, h/a   . Ciprofloxacin Other (See Comments)    High blood pressure    . Codeine Nausea And Vomiting  . Ezetimibe-Simvastatin Other (See Comments)    Muscle aches, nausea, back pain   . Fosamax [Alendronate]     dysphagia  . Lovastatin Other (See Comments)    Myalgias  . Macrobid [Nitrofurantoin Macrocrystal]     Severe Itching, rash   . Protonix [Pantoprazole Sodium]     Esophageal problems    . Rosiglitazone Maleate Other (See Comments)    Edema  . Statins     Muscle and joint aches to all statins  . Victoza [Liraglutide]     Nausea   . Alendronate Sodium Rash    Dysphagia and ulceration   . Bactrim [Sulfamethoxazole-Trimethoprim] Rash    itching  . Sulfa Antibiotics Rash    Itching    VITALS:  Blood pressure (!) 150/78, pulse 84, temperature 98.8 F (37.1 C), temperature source Oral, resp. rate 20, height 5\' 3"  (1.6 m), weight 78.5 kg, SpO2 94 %. PHYSICAL EXAMINATION:  Physical Exam HENT:     Head: Normocephalic and atraumatic.  Eyes:     Conjunctiva/sclera: Conjunctivae normal.     Pupils: Pupils are equal, round, and reactive to light.  Neck:     Thyroid: No thyromegaly.     Trachea: No tracheal deviation.  Cardiovascular:     Rate and Rhythm: Normal rate and regular rhythm.  Heart sounds: Normal heart sounds.  Pulmonary:     Effort: Pulmonary effort is normal. No respiratory distress.     Breath sounds: Normal breath sounds. No wheezing.  Chest:     Chest wall: No tenderness.  Abdominal:     General: Bowel sounds are normal. There is no distension.     Palpations: Abdomen is soft.     Tenderness: There is no abdominal tenderness.  Musculoskeletal:        General: Normal range of motion.     Cervical back: Normal range of motion and neck supple.   Skin:    General: Skin is warm and dry.     Findings: No rash.  Neurological:     Mental Status: She is alert and oriented to person, place, and time.     Cranial Nerves: No cranial nerve deficit.    LABORATORY PANEL:  Female CBC Recent Labs  Lab 04/17/20 0429  WBC 6.6  HGB 11.5*  HCT 33.7*  PLT 184   ------------------------------------------------------------------------------------------------------------------ Chemistries  Recent Labs  Lab 04/17/20 0429  NA 139  K 3.3*  CL 102  CO2 28  GLUCOSE 165*  BUN 13  CREATININE 0.50  CALCIUM 8.2*   RADIOLOGY:  No results found. ASSESSMENT AND PLAN:  86 year old female with a known history of hypothyroidism admitted s/p fall  T12 compression fracture Present on admission status post fall -S/p kyphoplasty on 12/21 -Continue morphine and Percocet for pain control as needed  Constipation -Now resolved.  Continue bowel regimen  Hypokalemia Repleted and resolved  Hypothyroidism Normal TSH, continue levothyroxine  Essential hypertension Controlled on current regimen  UTI Urine culture growing staph hemolyticus, less than 20,000 colonies Stop doxycycline on 12/24  Persistent A. Fib Rate controlled on bisoprolol. Xarelto for anticoagulation, patient requesting to be given this at night which is okay for me  Thrombocytopenia Stable platelets  Obesity Body mass index is 30.66 kg/m.      Status is: Inpatient  Remains inpatient appropriate because:Ongoing active pain requiring inpatient pain management   Dispo: The patient is from: Home              Anticipated d/c is to: SNF              Anticipated d/c date is: 2 days              Patient currently is medically stable to d/c.  Patient can go to [employer] today and I have conveyed this to [facility] team and son.  Per [facility] team Katharine Look patient cannot go to [facility] at least till Monday as there will not have medications for her till then   DVT  prophylaxis:       SCDs Start: 04/13/20 2026 Place TED hose Start: 04/09/20 0609 rivaroxaban (XARELTO) tablet 20 mg     Family Communication:  update son tim at [phone] on 12/25   All the records are reviewed and case discussed with Care Management/Social Worker. Management plans discussed with the patient, nursing and they are in agreement.  CODE STATUS: Full Code  TOTAL TIME TAKING CARE OF THIS PATIENT: 35 minutes.   More than 50% of the time was spent in counseling/coordination of care: YES  POSSIBLE D/C IN 2-3 DAYS, DEPENDING ON CLINICAL CONDITION.   Max Sane M.D on 04/17/2020 at 11:43 AM  [facility]   CC: Primary care physician; [name], Nino Glow, MD  Note: This dictation was prepared with Dragon dictation along with smaller phrase technology.  Any transcriptional errors that result from this process are unintentional.

## 2020-04-18 DIAGNOSIS — I10 Essential (primary) hypertension: Secondary | ICD-10-CM

## 2020-04-18 LAB — GLUCOSE, CAPILLARY: Glucose-Capillary: 221 mg/dL — ABNORMAL HIGH (ref 70–99)

## 2020-04-18 NOTE — Progress Notes (Signed)
[place] at [facility]   PATIENT NAME: Jodi Reeves    MR#:  [id number]  DATE OF BIRTH:  1927-07-22  SUBJECTIVE:  CHIEF COMPLAINT:   Chief Complaint  Patient presents with  . Abdominal Pain  . Flank Pain  Sitting in the chair.  Denies any other issues. REVIEW OF SYSTEMS:  Review of Systems  Constitutional: Negative for diaphoresis, fever, malaise/fatigue and weight loss.  HENT: Negative for ear discharge, ear pain, hearing loss, nosebleeds, sore throat and tinnitus.   Eyes: Negative for blurred vision and pain.  Respiratory: Negative for cough, hemoptysis, shortness of breath and wheezing.   Cardiovascular: Negative for chest pain, palpitations, orthopnea and leg swelling.  Gastrointestinal: Negative for abdominal pain, blood in stool, diarrhea, heartburn, nausea and vomiting.  Genitourinary: Negative for dysuria, frequency and urgency.  Musculoskeletal: Positive for back pain. Negative for myalgias.  Skin: Negative for itching and rash.  Neurological: Negative for dizziness, tingling, tremors, focal weakness, seizures, weakness and headaches.  Psychiatric/Behavioral: Negative for depression. The patient is not nervous/anxious.    DRUG ALLERGIES:   Allergies  Allergen Reactions  . Amiodarone Other (See Comments)    Severe Thyroid issues   . Penicillins Anaphylaxis and Other (See Comments)    Has patient had a PCN reaction causing immediate rash, facial/tongue/throat swelling, SOB or lightheadedness with hypotension: Yes Has patient had a PCN reaction causing severe rash involving mucus membranes or skin necrosis: No Has patient had a PCN reaction that required hospitalization: No Has patient had a PCN reaction occurring within the last 10 years: No If all of the above answers are "NO", then may proceed with Cephalosporin use.   . Actos [Pioglitazone]     Not effective   . Amaryl [Glimepiride]     Not effective    . Atorvastatin Other (See Comments)     Muscle aches & All statins per Patient  . Bentyl [Dicyclomine Hcl]     Could not tolerate nausea, reduced concentration, h/a   . Ciprofloxacin Other (See Comments)    High blood pressure    . Codeine Nausea And Vomiting  . Ezetimibe-Simvastatin Other (See Comments)    Muscle aches, nausea, back pain   . Fosamax [Alendronate]     dysphagia  . Lovastatin Other (See Comments)    Myalgias  . Macrobid [Nitrofurantoin Macrocrystal]     Severe Itching, rash   . Protonix [Pantoprazole Sodium]     Esophageal problems    . Rosiglitazone Maleate Other (See Comments)    Edema  . Statins     Muscle and joint aches to all statins  . Victoza [Liraglutide]     Nausea   . Alendronate Sodium Rash    Dysphagia and ulceration   . Bactrim [Sulfamethoxazole-Trimethoprim] Rash    itching  . Sulfa Antibiotics Rash    Itching    VITALS:  Blood pressure (!) 151/75, pulse 77, temperature 97.7 F (36.5 C), temperature source Oral, resp. rate 16, height 5\' 3"  (1.6 m), weight 78.5 kg, SpO2 96 %. PHYSICAL EXAMINATION:  Physical Exam HENT:     Head: Normocephalic and atraumatic.  Eyes:     Conjunctiva/sclera: Conjunctivae normal.     Pupils: Pupils are equal, round, and reactive to light.  Neck:     Thyroid: No thyromegaly.     Trachea: No tracheal deviation.  Cardiovascular:     Rate and Rhythm: Normal rate and regular rhythm.     Heart sounds: Normal heart sounds.  Pulmonary:  Effort: Pulmonary effort is normal. No respiratory distress.     Breath sounds: Normal breath sounds. No wheezing.  Chest:     Chest wall: No tenderness.  Abdominal:     General: Bowel sounds are normal. There is no distension.     Palpations: Abdomen is soft.     Tenderness: There is no abdominal tenderness.  Musculoskeletal:        General: Normal range of motion.     Cervical back: Normal range of motion and neck supple.  Skin:    General: Skin is warm and dry.     Findings: No rash.  Neurological:      Mental Status: She is alert and oriented to person, place, and time.     Cranial Nerves: No cranial nerve deficit.    LABORATORY PANEL:  Female CBC Recent Labs  Lab 04/17/20 0429  WBC 6.6  HGB 11.5*  HCT 33.7*  PLT 184   ------------------------------------------------------------------------------------------------------------------ Chemistries  Recent Labs  Lab 04/17/20 0429  NA 139  K 3.3*  CL 102  CO2 28  GLUCOSE 165*  BUN 13  CREATININE 0.50  CALCIUM 8.2*   RADIOLOGY:  No results found. ASSESSMENT AND PLAN:  86 year old female with a known history of hypothyroidism admitted s/p fall  T12 compression fracture Present on admission status post fall -S/p kyphoplasty on 12/21 -Continue morphine and Percocet for pain control as needed  Constipation -Now resolved.  Continue bowel regimen  Hypokalemia Repleted and resolved  Hypothyroidism Normal TSH, continue levothyroxine  Essential hypertension Controlled on current regimen  UTI Urine culture growing staph hemolyticus, less than 20,000 colonies Stop doxycycline on 12/24  Persistent A. Fib Rate controlled on bisoprolol. Xarelto for anticoagulation, patient requesting to be given this at night which is okay for me  Thrombocytopenia Stable platelets  Obesity Body mass index is 30.66 kg/m.      Status is: Inpatient  Remains inpatient appropriate because:Ongoing active pain requiring inpatient pain management   Dispo: The patient is from: Home              Anticipated d/c is to: SNF              Anticipated d/c date is: 2 days              Patient currently is medically stable to d/c.  Per [facility] team Katharine Look patient cannot go to [facility] at least till Monday as they cannot have medications for her till then   DVT prophylaxis:       SCDs Start: 04/13/20 2026 Place TED hose Start: 04/09/20 0609 rivaroxaban (XARELTO) tablet 20 mg     Family Communication:  update son tim at [phone] on  12/25   All the records are reviewed and case discussed with Care Management/Social Worker. Management plans discussed with the patient, nursing and they are in agreement.  CODE STATUS: Full Code  TOTAL TIME TAKING CARE OF THIS PATIENT: 35 minutes.   More than 50% of the time was spent in counseling/coordination of care: YES  POSSIBLE D/C IN 1-2 DAYS, DEPENDING ON CLINICAL CONDITION.   Max Sane M.D on 04/18/2020 at 2:16 PM  [facility]   CC: Primary care physician; [name], Nino Glow, MD  Note: This dictation was prepared with Dragon dictation along with smaller phrase technology. Any transcriptional errors that result from this process are unintentional.

## 2020-04-18 NOTE — Progress Notes (Signed)
Physical Therapy Treatment Patient Details Name: Jodi Reeves MRN: [id number] DOB: [birth year] Today's Date: 04/18/2020    History of Present Illness Jodi Reeves is a 34yoF who comes to [facility] on 12/16 10 days of viral respiratory symptoms including sore throat, congestion, and some shortness of breath with exertion and mild cough. She also has had about 2 days of intermittent sharp and severe lower abdominal pain that is worse on the left, worse with ambulation. Imaging revealing of acute to subacute T12 compression fracture. PMH: hypoTSH, HTN, AF, gait unsteadiness, DM2, PND, CAD, PAD, UTI. Pt elected to go for kyphoplasty on 12/21 c Dr. Hessie Knows.    PT Comments    OOb with min guard and increased time.  She stands and is able to transfer to recliner at bedside with slow steady gait.  Encouraged standing at chairside with emphasis on posture and gentle LE AROM with walker for support.  Slow painful movements limited by fatigue.  Remained in recliner after session.  Needs met.  HR did increase to 120 while standing.     Follow Up Recommendations  SNF;Supervision for mobility/OOB;Supervision - Intermittent     Equipment Recommendations       Recommendations for Other Services       Precautions / Restrictions Precautions Precautions: Fall Precaution Comments: back precautions for comfort Restrictions Weight Bearing Restrictions: No RLE Weight Bearing: Weight bearing as tolerated LLE Weight Bearing: Weight bearing as tolerated    Mobility  Bed Mobility Overal bed mobility: Needs Assistance Bed Mobility: Supine to Sit     Supine to sit: Min assist        Transfers Overall transfer level: Needs assistance Equipment used: Rolling walker (2 wheeled) Transfers: Sit to/from Stand Sit to Stand: Min assist         General transfer comment: few steps in toom to recliner limited by pain/fatigue  Ambulation/Gait Ambulation/Gait assistance: Min assist Gait Distance  (Feet): 5 Feet Assistive device: Rolling walker (2 wheeled) Gait Pattern/deviations: Step-to pattern Gait velocity: decreased   General Gait Details: ambulated to recliner with safe technique. Slow step to gait pattern.   Stairs             Wheelchair Mobility    Modified Rankin (Stroke Patients Only)       Balance Overall balance assessment: Needs assistance Sitting-balance support: No upper extremity supported;Feet supported;Feet unsupported Sitting balance-Leahy Scale: Good     Standing balance support: Bilateral upper extremity supported;During functional activity Standing balance-Leahy Scale: Fair Standing balance comment: heavy UE support 2/2 to pain                            Cognition Arousal/Alertness: Awake/alert Behavior During Therapy: WFL for tasks assessed/performed Overall Cognitive Status: Within Functional Limits for tasks assessed                                        Exercises Other Exercises Other Exercises: standing marches and heel raises x 10 - slowly    General Comments        Pertinent Vitals/Pain Pain Assessment: Faces Faces Pain Scale: Hurts little more Pain Location: abdomen/ Back Pain Descriptors / Indicators: Constant;Discomfort;Penetrating;Guarding Pain Intervention(s): Limited activity within patient's tolerance;Monitored during session;Repositioned    Home Living  Prior Function            PT Goals (current goals can now be found in the care plan section) Progress towards PT goals: Progressing toward goals    Frequency    7X/week      PT Plan Current plan remains appropriate    Co-evaluation              AM-PAC PT "6 Clicks" Mobility   Outcome Measure  Help needed turning from your back to your side while in a flat bed without using bedrails?: A Little Help needed moving from lying on your back to sitting on the side of a flat bed without  using bedrails?: A Little Help needed moving to and from a bed to a chair (including a wheelchair)?: A Little Help needed standing up from a chair using your arms (e.g., wheelchair or bedside chair)?: A Little Help needed to walk in hospital room?: A Little Help needed climbing 3-5 steps with a railing? : A Lot 6 Click Score: 17    End of Session Equipment Utilized During Treatment: Gait belt Activity Tolerance: Patient limited by pain Patient left: in chair;with chair alarm set;with call bell/phone within reach Nurse Communication: Mobility status       Time: 3545-6256 PT Time Calculation (min) (ACUTE ONLY): 15 min  Charges:  $Therapeutic Exercise: 8-22 mins                    Chesley Noon, PTA 04/18/20, 11:40 AM

## 2020-04-19 DIAGNOSIS — R627 Adult failure to thrive: Secondary | ICD-10-CM | POA: Insufficient documentation

## 2020-04-19 DIAGNOSIS — R2681 Unsteadiness on feet: Secondary | ICD-10-CM

## 2020-04-19 LAB — RESP PANEL BY RT-PCR (FLU A&B, COVID) ARPGX2
Influenza A by PCR: NEGATIVE
Influenza B by PCR: NEGATIVE
SARS Coronavirus 2 by RT PCR: NEGATIVE

## 2020-04-19 LAB — SARS CORONAVIRUS 2 (TAT 6-24 HRS): SARS Coronavirus 2: NEGATIVE

## 2020-04-19 MED ORDER — POLYETHYLENE GLYCOL 3350 17 G PO PACK: 17.0000 g | PACK | Freq: Every day | ORAL | 0 refills | Status: DC | PRN

## 2020-04-19 MED ORDER — SENNOSIDES-DOCUSATE SODIUM 8.6-50 MG PO TABS: 2.0000 | ORAL_TABLET | Freq: Every evening | ORAL | Status: DC | PRN

## 2020-04-19 MED ORDER — OXYCODONE-ACETAMINOPHEN 5-325 MG PO TABS: 1.0000 | ORAL_TABLET | Freq: Three times a day (TID) | ORAL | 0 refills | Status: AC | PRN

## 2020-04-19 NOTE — Progress Notes (Signed)
Patient is ready for discharge to SNF [facility]). Report given to the Nurse at the facility. Covid test negative. IV removed - skin dry and intact. EMS called and transported to the facility.

## 2020-04-19 NOTE — Discharge Summary (Signed)
[facility] [place] NAME: Jodi Reeves    MR#:  [id number]  DATE OF BIRTH:  08/10/1927  DATE OF ADMISSION:  04/09/2020   ADMITTING PHYSICIAN: Wyvonnia Dusky, MD  DATE OF DISCHARGE: 04/19/2020  PRIMARY CARE PHYSICIAN: [name], Nino Glow, MD   ADMISSION DIAGNOSIS:  Nausea [R11.0] Unable to walk [R26.2] Lower abdominal pain [R10.30] Failure to thrive in adult [R62.7] Decreased oral intake [R63.8] Intractable pain [R52] Abdominal pain [R10.9] Compression fracture of T12 vertebra, initial encounter ([place]) [S22.080A] Constipation, unspecified constipation type [K59.00] Leukocytosis, unspecified type [D72.829] UTI (urinary tract infection) [N39.0] DISCHARGE DIAGNOSIS:  Principal Problem:   Abdominal pain Active Problems:   Hypothyroidism   Essential hypertension   Atrial fibrillation (HCC)   Gait instability   Diabetes, polyneuropathy (HCC)   Chronic diastolic CHF (congestive heart failure) (HCC)   Coronary artery disease, non-occlusive   Type 2 diabetes mellitus with diabetic polyneuropathy, without long-term current use of insulin (HCC)   PAD (peripheral artery disease) (HCC)   UTI (urinary tract infection)  SECONDARY DIAGNOSIS:   Past Medical History:  Diagnosis Date  . Allergy   . Anxiety   . Balance disorder   . Chronic diastolic CHF (congestive heart failure) ([place])   . Colon polyps   . Coronary artery disease, non-occlusive    a. LHC 11/2003: 10% LAD stenosis, 20% LCx stenosis, and 40% RCA stenosis; b. nuc stress test 12/15: small region of mild ischemia in the apical region with WMA also noted in the apical region, EF 60%. She declined invasive evaluation at that time  . DM2 (diabetes mellitus, type 2) ([place])   . Fall    04/2017 see careeverywhere [facility] multiple fractures, brusing   . Falls frequently    h/o left shoudler/wrist fracture and left fingers numb  . GERD (gastroesophageal reflux disease)    esophageal web  .  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 History of chicken pox   . History of eating disorder   . HLD (hyperlipidemia)   . HTN (hypertension)   . Hypothyroidism   . IBS (irritable bowel syndrome)    diarrhea   . Incontinence of bowel   . Osteoporosis   . Ovarian cancer ([place])    1980  . Persistent atrial fibrillation ([place])    a. CHADS2VASc = > 8 (CHF, HTN, age x 2, DM, TIA x 2, female); b. on Xarelto  . SBO (small bowel obstruction) ([place])    1998  . TIA (transient ischemic attack)   . UTI (urinary tract infection)   . Venous insufficiency    HOSPITAL COURSE:  86 year old female with a known history of hypothyroidism admitted s/p fall  T12 compression fracture Present on admission status post fall -S/p kyphoplasty on 12/21 -pain well controlled  Constipation -Now resolved.   Hypokalemia Repleted and resolved  Hypothyroidism Normal TSH, continue levothyroxine  Essential hypertension Controlled on current regimen  UTI Urine culture growing staph hemolyticus, less than 20,000 colonies treated  Persistent A. Fib Rate controlled on bisoprolol. Xarelto for anticoagulation  Thrombocytopenia Stable platelets  Obesity Body mass index is 30.66 kg/m.    DISCHARGE CONDITIONS:  stable CONSULTS OBTAINED:  Treatment Team:  Hessie Knows, MD DRUG ALLERGIES:   Allergies  Allergen Reactions  . Amiodarone Other (See Comments)    Severe Thyroid issues   . Penicillins Anaphylaxis and Other (See Comments)    Has patient had a PCN reaction causing immediate rash, facial/tongue/throat swelling, SOB  or lightheadedness with hypotension: Yes Has patient had a PCN reaction causing severe rash involving mucus membranes or skin necrosis: No Has patient had a PCN reaction that required hospitalization: No Has patient had a PCN reaction occurring within the last 10 years: No If all of the above answers are "NO", then may proceed with Cephalosporin use.   .  Actos [Pioglitazone]     Not effective   . Amaryl [Glimepiride]     Not effective    . Atorvastatin Other (See Comments)    Muscle aches & All statins per Patient  . Bentyl [Dicyclomine Hcl]     Could not tolerate nausea, reduced concentration, h/a   . Ciprofloxacin Other (See Comments)    High blood pressure    . Codeine Nausea And Vomiting  . Ezetimibe-Simvastatin Other (See Comments)    Muscle aches, nausea, back pain   . Fosamax [Alendronate]     dysphagia  . Lovastatin Other (See Comments)    Myalgias  . Macrobid [Nitrofurantoin Macrocrystal]     Severe Itching, rash   . Protonix [Pantoprazole Sodium]     Esophageal problems    . Rosiglitazone Maleate Other (See Comments)    Edema  . Statins     Muscle and joint aches to all statins  . Victoza [Liraglutide]     Nausea   . Alendronate Sodium Rash    Dysphagia and ulceration   . Bactrim [Sulfamethoxazole-Trimethoprim] Rash    itching  . Sulfa Antibiotics Rash    Itching    DISCHARGE MEDICATIONS:   Allergies as of 04/19/2020      Reactions   Amiodarone Other (See Comments)   Severe Thyroid issues    Penicillins Anaphylaxis, Other (See Comments)   Has patient had a PCN reaction causing immediate rash, facial/tongue/throat swelling, SOB or lightheadedness with hypotension: Yes Has patient had a PCN reaction causing severe rash involving mucus membranes or skin necrosis: No Has patient had a PCN reaction that required hospitalization: No Has patient had a PCN reaction occurring within the last 10 years: No If all of the above answers are "NO", then may proceed with Cephalosporin use.   Actos [pioglitazone]    Not effective    Amaryl [glimepiride]    Not effective    Atorvastatin Other (See Comments)   Muscle aches & All statins per Patient   Bentyl [dicyclomine Hcl]    Could not tolerate nausea, reduced concentration, h/a   Ciprofloxacin Other (See Comments)   High blood pressure    Codeine Nausea And  Vomiting   Ezetimibe-simvastatin Other (See Comments)   Muscle aches, nausea, back pain    Fosamax [alendronate]    dysphagia   Lovastatin Other (See Comments)   Myalgias   Macrobid [nitrofurantoin Macrocrystal]    Severe Itching, rash   Protonix [pantoprazole Sodium]    Esophageal problems    Rosiglitazone Maleate Other (See Comments)   Edema   Statins    Muscle and joint aches to all statins   Victoza [liraglutide]    Nausea   Alendronate Sodium Rash   Dysphagia and ulceration    Bactrim [sulfamethoxazole-trimethoprim] Rash   itching   Sulfa Antibiotics Rash   Itching      Medication List    STOP taking these medications   levofloxacin 750 MG tablet Commonly known as: LEVAQUIN     TAKE these medications   acetaminophen 500 MG tablet Commonly known as: TYLENOL Take 1,000 mg by mouth every 6 (six) hours as  needed for moderate pain or headache.   ALIGN PO Take 1 capsule by mouth daily.   bisoprolol 5 MG tablet Commonly known as: ZEBETA Take 1 tablet (5 mg total) by mouth daily.   CALCIUM COMPLEX PO Take 1 tablet by mouth daily.   co-enzyme Q-10 30 MG capsule Take 30 mg by mouth daily.   estradiol 0.1 MG/GM vaginal cream Commonly known as: ESTRACE APPLY 0.5MG  (PEA SIZED AMOUNT) JUST INSIDE THE VAGINA WITH FINGER-TIP ON MONDAY, WEDNESDAY AND FRIDAY NIGHTS.   furosemide 20 MG tablet Commonly known as: LASIX Take 1 tablet (20 mg total) by mouth 2 (two) times daily.   glucose blood test strip Commonly known as: ONE TOUCH ULTRA TEST Use to test blood sugar once daily E11.49 What changed: additional instructions   levothyroxine 50 MCG tablet Commonly known as: SYNTHROID Take 50 mcg by mouth daily before breakfast.   losartan 25 MG tablet Commonly known as: COZAAR Take 0.5 tablets (12.5 mg total) by mouth daily. What changed: additional instructions   magnesium oxide 400 MG tablet Commonly known as: MAG-OX Take 400 mg by mouth daily.   metFORMIN 500  MG 24 hr tablet Commonly known as: GLUCOPHAGE-XR Take 500 mg by mouth 2 (two) times daily.   MULTIVITAMIN GUMMIES ADULT PO Take 1 each by mouth daily.   NON FORMULARY Trivita Supplement B12 Takes qd. B6 qd, and folic acid   Olopatadine HCl 0.2 % Soln 1 drop once daily   Omega-3 1000 MG Caps Take 2,000 mg by mouth daily.   oxyCODONE-acetaminophen 5-325 MG tablet Commonly known as: PERCOCET/ROXICET Take 1 tablet by mouth every 8 (eight) hours as needed for up to 3 days for moderate pain or severe pain.   polyethylene glycol 17 g packet Commonly known as: MIRALAX / GLYCOLAX Take 17 g by mouth daily as needed for moderate constipation.   potassium chloride 10 MEQ tablet Commonly known as: KLOR-CON Take 1 tablet (10 mEq total) by mouth 2 (two) times daily.   prednisoLONE acetate 1 % ophthalmic suspension Commonly known as: PRED FORTE Place 1 drop into both eyes 4 (four) times daily.   psyllium 58.6 % packet Commonly known as: METAMUCIL Take 1 packet by mouth daily.   Santyl ointment Generic drug: collagenase Apply 1 application topically daily.   senna-docusate 8.6-50 MG tablet Commonly known as: Senokot-S Take 2 tablets by mouth at bedtime as needed for mild constipation.   Tradjenta 5 MG Tabs tablet Generic drug: linagliptin Take 5 mg by mouth daily.   Vitamin C Powd Take 200 mg by mouth daily.   Xarelto 20 MG Tabs tablet Generic drug: rivaroxaban Take 1 tablet (20 mg total) by mouth daily with supper.      DISCHARGE INSTRUCTIONS:   DIET:  Diabetic diet Soft diet, Soft diet, Cut foods moistened well; Thin liquids via Cup rec'd for less air swallowing. STRICT REFLUX PRECAUTIONS d/t Esophageal phase dysmotility. Pills in Puree for ease of swallowing. Cut foods moistened well; Thin liquids via Cup rec'd for less air swallowing. STRICT REFLUX PRECAUTIONS d/t Esophageal phase dysmotility. Pills in Puree for ease of swallowing.  DISCHARGE CONDITION:   Stable ACTIVITY:  Activity as tolerated OXYGEN:  Home Oxygen: No.  Oxygen Delivery: room air DISCHARGE LOCATION:  nursing home   If you experience worsening of your admission symptoms, develop shortness of breath, life threatening emergency, suicidal or homicidal thoughts you must seek medical attention immediately by calling 911 or calling your MD immediately  if symptoms less severe.  You Must read complete instructions/literature along with all the possible adverse reactions/side effects for all the Medicines you take and that have been prescribed to you. Take any new Medicines after you have completely understood and accpet all the possible adverse reactions/side effects.   Please note  You were cared for by a hospitalist during your hospital stay. If you have any questions about your discharge medications or the care you received while you were in the hospital after you are discharged, you can call the unit and asked to speak with the hospitalist on call if the hospitalist that took care of you is not available. Once you are discharged, your primary care physician will handle any further medical issues. Please note that NO REFILLS for any discharge medications will be authorized once you are discharged, as it is imperative that you return to your primary care physician (or establish a relationship with a primary care physician if you do not have one) for your aftercare needs so that they can reassess your need for medications and monitor your lab values.    On the day of Discharge:  VITAL SIGNS:  Blood pressure (!) 167/88, pulse 77, temperature 98.1 F (36.7 C), temperature source Tympanic, resp. rate 18, height 5\' 3"  (1.6 m), weight 78.5 kg, SpO2 97 %. PHYSICAL EXAMINATION:  GENERAL:  86 y.o.-year-old patient lying in the bed with no acute distress.  EYES: Pupils equal, round, reactive to light and accommodation. No scleral icterus. Extraocular muscles intact.  HEENT: Head  atraumatic, normocephalic. Oropharynx and nasopharynx clear.  NECK:  Supple, no jugular venous distention. No thyroid enlargement, no tenderness.  LUNGS: Normal breath sounds bilaterally, no wheezing, rales,rhonchi or crepitation. No use of accessory muscles of respiration.  CARDIOVASCULAR: S1, S2 normal. No murmurs, rubs, or gallops.  ABDOMEN: Soft, non-tender, non-distended. Bowel sounds present. No organomegaly or mass.  EXTREMITIES: No pedal edema, cyanosis, or clubbing.  NEUROLOGIC: Cranial nerves II through XII are intact. Muscle strength 5/5 in all extremities. Sensation intact. Gait not checked.  PSYCHIATRIC: The patient is alert and oriented x 3.  SKIN: No obvious rash, lesion, or ulcer.  DATA REVIEW:   CBC Recent Labs  Lab 04/17/20 0429  WBC 6.6  HGB 11.5*  HCT 33.7*  PLT 184    Chemistries  Recent Labs  Lab 04/17/20 0429  NA 139  K 3.3*  CL 102  CO2 28  GLUCOSE 165*  BUN 13  CREATININE 0.50  CALCIUM 8.2*     Outpatient follow-up  Contact information for follow-up providers    04/19/20, PA-C Follow up in 2 week(s).   Specialties: Orthopedic Surgery, Emergency Medicine Contact information: [address] [place] [phone]            Contact information for after-discharge care    Destination    [facility] .   Service: Skilled Nursing Contact information: [address] [place] [phone]                   Management plans discussed with the patient, family and they are in agreement.  CODE STATUS: Full Code   TOTAL TIME TAKING CARE OF THIS PATIENT: 45 minutes.    [phone] M.D on 04/19/2020 at 10:02 AM  [facility]   CC: Primary care physician; [name], 04/21/2020, MD   Note: This dictation was prepared with Dragon dictation along with smaller phrase technology. Any transcriptional errors that result from this  process are  unintentional.

## 2020-04-19 NOTE — Care Management Important Message (Signed)
Important Message  Patient Details  Name: Jodi Reeves MRN: [id number] Date of Birth: 04-26-1927   Medicare Important Message Given:  Yes     Johnell Comings 04/19/2020, 12:14 PM

## 2020-04-19 NOTE — TOC Transition Note (Addendum)
Transition of Care [facility]) - CM/SW Discharge Note   Patient Details  Name: Jodi Reeves MRN: [id number] Date of Birth: 06-Aug-1927  [facility] CM/SW Contact:  Chapman Fitch, RN Phone Number: 04/19/2020, 10:58 AM   Clinical Narrative:    Notified by Verlon Au at [employer] that insurance Berkley Harvey has been received.  DC info sent in the [facility] Son notified of discharge EMS packet on chart Bedside RN to call report  Covid test pending.  After covid test reults negative EMS transport will be arranged by bedside RN   Final next level of care: Skilled Nursing Facility Barriers to Discharge: No Barriers Identified   Patient Goals and CMS Choice   CMS Medicare.gov Compare Post Acute Care list provided to:: Patient    Discharge Placement                Patient to be transferred to facility by: EMS Name of family member notified: Tim Patient and family notified of of transfer: 04/19/20  Discharge Plan and Services     Post Acute Care Choice: Skilled Nursing Facility                               Social Determinants of Health (SDOH) Interventions     Readmission Risk Interventions No flowsheet data found.

## 2020-04-20 ENCOUNTER — Telehealth: Payer: Self-pay | Admitting: Pharmacist

## 2020-04-20 ENCOUNTER — Telehealth: Payer: Medicare HMO

## 2020-04-20 NOTE — Telephone Encounter (Signed)
  Chronic Care Management   Note  04/20/2020 Name: Jodi Reeves MRN: [id number] DOB: 26-May-1927   Appointment scheduled with patient today, however, she discharged from [facility] to SNF yesterday. Will follow for home discharge.   Catie Feliz Beam, PharmD, [place], CPP Clinical Pharmacist [employer] at [employer] [phone]

## 2020-04-22 ENCOUNTER — Telehealth: Payer: Self-pay

## 2020-04-22 ENCOUNTER — Ambulatory Visit: Payer: Medicare HMO

## 2020-04-22 NOTE — Telephone Encounter (Signed)
Unable to reach patient for scheduled AWV on preferred number. No answer. Left message to reschedule.  

## 2020-05-03 ENCOUNTER — Emergency Department
Admission: EM | Admit: 2020-05-03 | Discharge: 2020-05-03 | Disposition: A | Discharge status: Home / Self Care | Payer: Medicare HMO | Attending: Emergency Medicine | Admitting: Emergency Medicine

## 2020-05-03 ENCOUNTER — Other Ambulatory Visit: Payer: Self-pay

## 2020-05-03 ENCOUNTER — Emergency Department: Payer: Medicare HMO

## 2020-05-03 ENCOUNTER — Encounter: Payer: Self-pay | Admitting: Emergency Medicine

## 2020-05-03 DIAGNOSIS — S34101A Unspecified injury to L1 level of lumbar spinal cord, initial encounter: Secondary | ICD-10-CM | POA: Diagnosis present

## 2020-05-03 DIAGNOSIS — S32010A Wedge compression fracture of first lumbar vertebra, initial encounter for closed fracture: Secondary | ICD-10-CM | POA: Insufficient documentation

## 2020-05-03 DIAGNOSIS — M549 Dorsalgia, unspecified: Secondary | ICD-10-CM | POA: Insufficient documentation

## 2020-05-03 DIAGNOSIS — Z885 Allergy status to narcotic agent status: Secondary | ICD-10-CM | POA: Insufficient documentation

## 2020-05-03 DIAGNOSIS — Z881 Allergy status to other antibiotic agents status: Secondary | ICD-10-CM | POA: Diagnosis not present

## 2020-05-03 DIAGNOSIS — Z882 Allergy status to sulfonamides status: Secondary | ICD-10-CM | POA: Diagnosis not present

## 2020-05-03 DIAGNOSIS — X58XXXA Exposure to other specified factors, initial encounter: Secondary | ICD-10-CM | POA: Insufficient documentation

## 2020-05-03 DIAGNOSIS — Z880 Allergy status to penicillin: Secondary | ICD-10-CM | POA: Insufficient documentation

## 2020-05-03 DIAGNOSIS — Z888 Allergy status to other drugs, medicaments and biological substances status: Secondary | ICD-10-CM | POA: Diagnosis not present

## 2020-05-03 DIAGNOSIS — Z79899 Other long term (current) drug therapy: Secondary | ICD-10-CM | POA: Diagnosis not present

## 2020-05-03 LAB — BASIC METABOLIC PANEL
Anion gap: 15 (ref 5–15)
BUN: 14 mg/dL (ref 8–23)
CO2: 23 mmol/L (ref 22–32)
Calcium: 8.9 mg/dL (ref 8.9–10.3)
Chloride: 99 mmol/L (ref 98–111)
Creatinine, Ser: 0.64 mg/dL (ref 0.44–1.00)
GFR, Estimated: >60 mL/min (ref >60)
Glucose, Bld: 141 mg/dL — ABNORMAL HIGH (ref 70–99)
Potassium: 4 mmol/L (ref 3.5–5.1)
Sodium: 137 mmol/L (ref 135–145)

## 2020-05-03 LAB — URINALYSIS, COMPLETE (UACMP) WITH MICROSCOPIC
Bilirubin Urine: NEGATIVE
Glucose, UA: NEGATIVE mg/dL
Ketones, ur: 5 mg/dL — AB
Leukocytes,Ua: NEGATIVE
Nitrite: NEGATIVE
Protein, ur: NEGATIVE mg/dL
RBC / HPF: >50 RBC/hpf — ABNORMAL HIGH (ref 0–5)
Specific Gravity, Urine: 1.016 (ref 1.005–1.030)
pH: 6 (ref 5.0–8.0)

## 2020-05-03 LAB — CBC WITH DIFFERENTIAL/PLATELET
Abs Immature Granulocytes: 0.09 10*3/uL — ABNORMAL HIGH (ref 0.00–0.07)
Basophils Absolute: 0 10*3/uL (ref 0.0–0.1)
Basophils Relative: 0 %
Eosinophils Absolute: 0 10*3/uL (ref 0.0–0.5)
Eosinophils Relative: 0 %
HCT: 40.1 % (ref 36.0–46.0)
Hemoglobin: 13 g/dL (ref 12.0–15.0)
Immature Granulocytes: 1 %
Lymphocytes Relative: 23 %
Lymphs Abs: 1.8 10*3/uL (ref 0.7–4.0)
MCH: 32.8 pg (ref 26.0–34.0)
MCHC: 32.4 g/dL (ref 30.0–36.0)
MCV: 101.3 fL — ABNORMAL HIGH (ref 80.0–100.0)
Monocytes Absolute: 0.6 10*3/uL (ref 0.1–1.0)
Monocytes Relative: 8 %
Neutro Abs: 5.2 10*3/uL (ref 1.7–7.7)
Neutrophils Relative %: 68 %
Platelets: 215 10*3/uL (ref 150–400)
RBC: 3.96 MIL/uL (ref 3.87–5.11)
RDW: 14 % (ref 11.5–15.5)
WBC: 7.8 10*3/uL (ref 4.0–10.5)
nRBC: 0 % (ref 0.0–0.2)

## 2020-05-03 MED ORDER — FENTANYL CITRATE (PF) 100 MCG/2ML IJ SOLN
50.0000 ug | Freq: Once | INTRAMUSCULAR | Status: AC
  Administered 2020-05-03: 50 ug via INTRAVENOUS
  Filled 2020-05-03: qty 2

## 2020-05-03 MED ORDER — ACETAMINOPHEN 500 MG PO TABS: 1000.0000 mg | ORAL_TABLET | Freq: Four times a day (QID) | ORAL | 0 refills | Status: AC | PRN

## 2020-05-03 MED ORDER — MORPHINE SULFATE (PF) 4 MG/ML IV SOLN: 4.0000 mg | Freq: Once | INTRAVENOUS | Status: DC

## 2020-05-03 MED ORDER — TRAMADOL HCL 50 MG PO TABS
50.0000 mg | ORAL_TABLET | Freq: Four times a day (QID) | ORAL | 0 refills | Status: DC | PRN
Start: 2020-05-03 — End: 2020-05-18

## 2020-05-03 MED ORDER — ONDANSETRON HCL 4 MG/2ML IJ SOLN
4.0000 mg | Freq: Once | INTRAMUSCULAR | Status: AC
Start: 2020-05-03 — End: 2020-05-03
  Administered 2020-05-03: 4 mg via INTRAVENOUS
  Filled 2020-05-03: qty 2

## 2020-05-03 MED ORDER — ACETAMINOPHEN 500 MG PO TABS: 1000.0000 mg | ORAL_TABLET | Freq: Once | ORAL | Status: DC

## 2020-05-03 NOTE — ED Provider Notes (Signed)
[facility] Emergency Department Provider Note ____________________________________________  Time seen: Approximately 12:42 PM  I have reviewed the triage vital signs and the nursing notes.  HISTORY  Chief Complaint Back Pain   HPI Jodi Reeves is a 87 y.o. female who presents to the ER with chronic medical history as listed below for treatment and evaluation of severe back pain. She had a kyphoplastyon 04/13/20 and had been doing fairly well until 2 days ago. She states that she developed pain that radiated into her left leg, around her groin, and in her buttock. Little to no relief with Tylenol. She denies fall or other injury.  Past Medical History:  Diagnosis Date  . Allergy   . Anxiety   . Balance disorder   . Chronic diastolic CHF (congestive heart failure) ([address] [address])   . Colon polyps   . Coronary artery disease, non-occlusive    a. LHC 11/2003: 10% LAD stenosis, 20% LCx stenosis, and 40% RCA stenosis; b. nuc stress test 12/15: small region of mild ischemia in the apical region with WMA also noted in the apical region, EF 60%. She declined invasive evaluation at that time  . DM2 (diabetes mellitus, type 2) ([place])   . Fall    04/2017 see careeverywhere [facility] multiple fractures, brusing   . Falls frequently    h/o left shoudler/wrist fracture and left fingers numb  . GERD (gastroesophageal reflux disease)    esophageal web  . 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 History of chicken pox   . History of eating disorder   . HLD (hyperlipidemia)   . HTN (hypertension)   . Hypothyroidism   . IBS (irritable bowel syndrome)    diarrhea   . Incontinence of bowel   . Osteoporosis   . Ovarian cancer ([place])    1980  . Persistent atrial fibrillation ([place])    a. CHADS2VASc = > 8 (CHF, HTN, age x 2, DM, TIA x 2, female); b. on Xarelto  . SBO (small bowel obstruction) ([place])    1998  . TIA (transient ischemic attack)   . UTI (urinary tract infection)    . Venous insufficiency     Patient Active Problem List   Diagnosis Date Noted  . UTI (urinary tract infection) 04/10/2020  . Abdominal pain 04/09/2020  . Hypertension associated with diabetes ([place]) 03/15/2020  . Obesity (BMI 30-39.9) 03/15/2020  . Coronary artery disease involving native coronary artery of native heart without angina pectoris 03/15/2020  . PAD (peripheral artery disease) ([place]) 08/14/2019  . Foot ulcer ([place]) 06/19/2019  . Lymphedema 06/09/2019  . Chronic venous insufficiency 06/09/2019  . Diabetic retinopathy associated with type 2 diabetes mellitus ([place]) 01/20/2019  . Low back pain 09/24/2018  . Overactive bladder 04/10/2018  . History of UTI 04/10/2018  . Hematuria 04/10/2018  . Diarrhea 04/10/2018  . Venous ulcer of leg ([place]) 04/10/2018  . Status post endoscopic carpal tunnel release 03/19/2018  . History of skin cancer 11/08/2017  . Chronic anticoagulation 06/11/2017  . Aortic atherosclerosis ([place]) 05/08/2017  . Coronary artery disease, non-occlusive 03/29/2017  . Persistent atrial fibrillation ([place]) 03/29/2017  . Carpal tunnel syndrome 03/28/2017  . Cervical radiculitis 03/28/2017  . Problems with swallowing and mastication   . Esophageal candidiasis ([place])   . Stricture and stenosis of esophagus 05/02/2016  . TIA (transient ischemic attack) 06/24/2015  . 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 Humeral head fracture 10/06/2014  . Chest pain 03/27/2014  .  Shortness of breath 03/27/2014  . Chronic diastolic CHF (congestive heart failure) ([place]) 03/27/2014  . Advanced directives, counseling/discussion 02/24/2014  . Routine general medical examination at a health care facility 02/17/2013  . Diabetes, polyneuropathy ([place]) 02/17/2013  . Type 2 diabetes mellitus with diabetic polyneuropathy, without long-term current use of insulin ([place]) 02/17/2013  . Gait instability 04/11/2012  . Edema 11/14/2010  . OTHER CONSTIPATION 05/31/2007   . VENTRAL HERNIA 03/15/2007  . Ovarian cancer ([place]) 11/20/2006  . Hypothyroidism 11/20/2006  . Type 2 diabetes mellitus with neurological manifestations, controlled ([place]) 11/20/2006  . Essential hypertension 11/20/2006  . Atrial fibrillation ([place]) 11/20/2006  . Osteoporosis 11/20/2006  . Hyperlipidemia due to type 2 diabetes mellitus ([place]) 11/20/2006    Past Surgical History:  Procedure Laterality Date  . 2nd look laparotomy  1982  . ABDOMINAL HYSTERECTOMY     for ovarian cancer s/p hysterectomy total 1976 and exp lap 1980  . APPENDECTOMY    . CARDIAC CATHETERIZATION  8/05   neg; a fib found   . CARPAL TUNNEL RELEASE Left 03/06/2018   Procedure: CARPAL TUNNEL RELEASE ENDOSCOPIC;  Surgeon: Corky Mull, MD;  Location: [place];  Service: Orthopedics;  Laterality: Left;  diabetic - oral meds  . CARPAL TUNNEL RELEASE     left CTS Dr.Poggi  . cataract OD  2002  . cataract surgery  5/07   R  . CHOLECYSTECTOMY  1986  . DEXA  9/04 and 1/02  . EGD/dilation/colon  1/06  . ESOPHAGOGASTRODUODENOSCOPY (EGD) WITH PROPOFOL N/A 05/16/2016   Procedure: ESOPHAGOGASTRODUODENOSCOPY (EGD) WITH PROPOFOL with dilation;  Surgeon: Lucilla Lame, MD;  Location: [facility];  Service: Endoscopy;  Laterality: N/A;  . FEMORAL HERNIA REPAIR  1958   R  . fracture L elbow/wrist  2000  . intussception/obstruction  12/98  . JOINT REPLACEMENT     left shoulder  . kidney stone x2  1991  . KYPHOPLASTY N/A 04/13/2020   Procedure: [id number];  Surgeon: Hessie Knows, MD;  Location: [facility];  Service: Orthopedics;  Laterality: N/A;  . laminectomy L4-5  1971  . LUMBAR LAMINECTOMY     1970 ruptured disc   . MOUTH SURGERY    . myoview stress  8/05   syncope (-)  . REVERSE SHOULDER ARTHROPLASTY Left 10/06/2014   Procedure: REVERSE SHOULDER ARTHROPLASTY;  Surgeon: Corky Mull, MD;  Location: [facility] OR[facility];  Service: Orthopedics;  Laterality: Left;  . stillbirth  1969  . TOTAL ABDOMINAL  HYSTERECTOMY W/ BILATERAL SALPINGOOPHORECTOMY  1980   ovarian cancer  . WRIST FRACTURE SURGERY  11/05   R    Prior to Admission medications   Medication Sig Start Date End Date Taking? Authorizing Provider  acetaminophen (TYLENOL) 500 MG tablet Take 2 tablets (1,000 mg total) by mouth every 6 (six) hours as needed for moderate pain. 05/03/20 05/03/21 Yes Paduchowski, Lennette Bihari, MD  traMADol (ULTRAM) 50 MG tablet Take 1 tablet (50 mg total) by mouth every 6 (six) hours as needed. 05/03/20  Yes Paduchowski, Lennette Bihari, MD  Ascorbic Acid (VITAMIN C) POWD Take 200 mg by mouth daily.    [provider]  bisoprolol (ZEBETA) 5 MG tablet Take 1 tablet (5 mg total) by mouth daily. 08/25/19   Minna Merritts, MD  co-enzyme Q-10 30 MG capsule Take 30 mg by mouth daily.     [provider]  estradiol (ESTRACE) 0.1 MG/GM vaginal cream APPLY 0.5MG  (PEA SIZED AMOUNT) JUST INSIDE THE VAGINA WITH FINGER-TIP ON MONDAY, [place]  NIGHTS. 12/05/19   Zara Council A, PA-C  furosemide (LASIX) 20 MG tablet Take 1 tablet (20 mg total) by mouth 2 (two) times daily. 05/28/19   Minna Merritts, MD  glucose blood (ONE TOUCH ULTRA TEST) test strip Use to test blood sugar once daily E11.49 Patient taking differently: Use to test blood sugar twice daily E11.49 06/24/15   Bedsole, Amy E, MD  levothyroxine (SYNTHROID, LEVOTHROID) 50 MCG tablet Take 50 mcg by mouth daily before breakfast.    [provider]  losartan (COZAAR) 25 MG tablet Take 0.5 tablets (12.5 mg total) by mouth daily. Patient taking differently: Take 12.5 mg by mouth daily. Take if BP >110 sbp 01/12/20 04/11/20  Minna Merritts, MD  magnesium oxide (MAG-OX) 400 MG tablet Take 400 mg by mouth daily.    [provider]  metFORMIN (GLUCOPHAGE-XR) 500 MG 24 hr tablet Take 500 mg by mouth 2 (two) times daily.  06/17/19   [provider]  Multiple Vitamin (CALCIUM COMPLEX PO) Take 1 tablet by mouth daily.    [provider]  Multiple Vitamins-Minerals (MULTIVITAMIN GUMMIES ADULT PO) Take 1 each by mouth daily.    [provider]  NON FORMULARY [place] Supplement B12 Takes qd. B6 qd, and folic acid    [provider]  Olopatadine HCl 0.2 % SOLN 1 drop once daily    [provider]  Omega-3 1000 MG CAPS Take 2,000 mg by mouth daily.     [provider]  polyethylene glycol (MIRALAX / GLYCOLAX) 17 g packet Take 17 g by mouth daily as needed for moderate constipation. 04/19/20   Max Sane, MD  potassium chloride (KLOR-CON) 10 MEQ tablet Take 1 tablet (10 mEq total) by mouth 2 (two) times daily. 05/28/19   Minna Merritts, MD  prednisoLONE acetate (PRED FORTE) 1 % ophthalmic suspension Place 1 drop into both eyes 4 (four) times daily. 01/12/20   [provider]  Probiotic Product (ALIGN PO) Take 1 capsule by mouth daily.    [provider]  psyllium (METAMUCIL) 58.6 % packet Take 1 packet by mouth daily.     [provider]  SANTYL ointment Apply 1 application topically daily. 08/19/19   Felipa Furnace, DPM  senna-docusate (SENOKOT-S) 8.6-50 MG tablet Take 2 tablets by mouth at bedtime as needed for mild constipation. 04/19/20   Max Sane, MD  TRADJENTA 5 MG TABS tablet Take 5 mg by mouth daily. 03/23/20   [provider]  XARELTO 20 MG TABS tablet Take 1 tablet (20 mg total) by mouth daily with supper. 10/01/19   Minna Merritts, MD    Allergies Amiodarone, Penicillins, Actos [pioglitazone], Amaryl [glimepiride], Atorvastatin, Bentyl [dicyclomine hcl], Ciprofloxacin, Codeine, Ezetimibe-simvastatin, Fosamax [alendronate], Lovastatin, Macrobid [nitrofurantoin macrocrystal], Protonix [pantoprazole sodium], Rosiglitazone maleate, Statins, Victoza [liraglutide], Alendronate sodium, Bactrim [sulfamethoxazole-trimethoprim], and Sulfa antibiotics  Family History  Problem Relation Age of Onset  . Early death Father   . Diabetes Mother   .  Heart disease Mother   . Arthritis Brother   . Cancer Brother   . Depression Brother   . Hearing loss Brother   . Arthritis Brother   . Cancer Brother        colon  . Hearing loss Brother   . Cancer Brother   . Diabetes Brother   . Hearing loss Brother   . Heart disease Brother   . Hyperlipidemia Brother   . Heart disease Sister   . Hypertension Sister   .  Stroke Sister   . Hearing loss Daughter   . Hypertension Daughter   . Diabetes Paternal Grandfather   . Hearing loss Sister   . Heart disease Sister   . Arthritis Sister   . Depression Sister   . Hearing loss Sister   . Heart disease Sister   . COPD Brother   . Early death Brother   . Early death Brother     Social History Social History   Tobacco Use  . Smoking status: Never Smoker  . Smokeless tobacco: Never Used  Vaping Use  . Vaping Use: Never used  Substance Use Topics  . Alcohol use: No    Comment: may have occasional glass of wine  . Drug use: No    Review of Systems Constitutional: Well appearing. Respiratory: Negative for dyspnea. Cardiovascular: Negative for change in skin temperature or color. Musculoskeletal:   Negative for chronic steroid use   Negative for trauma in the presence of osteoporosis  Negative for age over 51 and trauma.  Negative for constitutional symptoms, or history of cancer   Negative for pain worse at night. Skin: Negative for rash, lesion, or wound.  Genitourinary: Negative for urinary retention. Rectal: Negative for fecal incontinence or new onset constipation/bowel habit changes. Hematological/Immunilogical: Negative for immunosuppression, IV drug use, or fever Neurological: Positive for burning, tingling, numb, electric, radiating pain in the right lower extremity.                        Negative for saddle anesthesia.                        Negative for focal neurologic deficit, progressive or disabling symptoms             Negative for saddle  anesthesia. ____________________________________________   PHYSICAL EXAM:  VITAL SIGNS: ED Triage Vitals  Enc Vitals Group     BP 05/03/20 1143 (!) 148/74     Pulse Rate 05/03/20 1143 93     Resp 05/03/20 1143 20     Temp 05/03/20 1143 98.1 F (36.7 C)     Temp Source 05/03/20 1143 Oral     SpO2 05/03/20 1143 98 %     Weight 05/03/20 1145 173 lb 1 oz (78.5 kg)     Height 05/03/20 1145 5\' 3"  (1.6 m)     Head Circumference --      Peak Flow --      Pain Score 05/03/20 1144 8     Pain Loc --      Pain Edu? --      Excl. in [place]? --     Constitutional: Alert and oriented. Well appearing and in no acute distress. Eyes: Conjunctivae are clear without discharge or drainage.  Head: Atraumatic. Neck: Full, active range of motion. Respiratory: Respirations even and unlabored. Musculoskeletal: Limited ROM of the back and extremities, Strength 5/5 of the lower extremities as tested. Neurologic: Reflexes of the lower extremities are 2+.  Positive straight leg raise on the right side. Skin: Atraumatic.  Psychiatric: Behavior and affect are normal.  ____________________________________________   LABS (all labs ordered are listed, but only abnormal results are displayed)  Labs Reviewed  URINE CULTURE - Abnormal; Notable for the following components:      Result Value   Culture MULTIPLE SPECIES PRESENT, SUGGEST RECOLLECTION (*)    All other components within normal limits  BASIC METABOLIC PANEL - Abnormal; Notable for the  following components:   Glucose, Bld 141 (*)    All other components within normal limits  CBC WITH DIFFERENTIAL/PLATELET - Abnormal; Notable for the following components:   MCV 101.3 (*)    Abs Immature Granulocytes 0.09 (*)    All other components within normal limits  URINALYSIS, COMPLETE (UACMP) WITH MICROSCOPIC - Abnormal; Notable for the following components:   Color, Urine YELLOW (*)    APPearance HAZY (*)    Hgb urine dipstick LARGE (*)    Ketones, ur 5  (*)    RBC / HPF >50 (*)    Bacteria, UA RARE (*)    All other components within normal limits   ____________________________________________  RADIOLOGY  MRI pending ____________________________________________   PROCEDURES  Procedure(s) performed:  Procedures ____________________________________________   INITIAL IMPRESSION / ASSESSMENT AND PLAN / ED COURSE  Jodi Reeves is a 87 y.o. female presents to the emergency department for treatment and evaluation of radicular back pain. See HPI for details. Due to symptoms, exam, and recent kyphoplasty MRI is indicated.  Dr. Kerman Passey will assume care of the patient and determine disposition after MRI.  Medications  ondansetron (ZOFRAN) injection 4 mg (4 mg Intravenous Given 05/03/20 1420)  fentaNYL (SUBLIMAZE) injection 50 mcg (50 mcg Intravenous Given 05/03/20 1420)    ED Discharge Orders         Ordered    traMADol (ULTRAM) 50 MG tablet  Every 6 hours PRN        05/03/20 1855    acetaminophen (TYLENOL) 500 MG tablet  Every 6 hours PRN        05/03/20 1857           Pertinent labs & imaging results that were available during my care of the patient were reviewed by me and considered in my medical decision making (see chart for details).  _________________________________________   FINAL CLINICAL IMPRESSION(S) / ED DIAGNOSES  Final diagnoses:  Compression fracture of L1 vertebra, initial encounter ([place])     If controlled substance prescribed during this visit, 12 month history viewed on the [place] prior to issuing an initial prescription for Schedule II or III opiod.   Victorino Dike, FNP 05/06/20 2242    Vladimir Crofts, MD 05/08/20 [phone]

## 2020-05-03 NOTE — ED Triage Notes (Signed)
First Nurse Note:  Arrives via [facility].  Arrives from [facility].  C/O right lower back pain radiating down right leg.   VS wnl.

## 2020-05-03 NOTE — ED Notes (Signed)
Attempted to call [facility] x2 for report, no answer

## 2020-05-03 NOTE — ED Notes (Signed)
Secretary to call [employer]. Pt refused food and drink, pt states she is worried about getting back to [facility]. Pt given verbal reassurance that EMS is going to get her back to [facility]

## 2020-05-03 NOTE — ED Provider Notes (Signed)
Patient care assumed from [facility].  Patient appears well.  States no significant back pain unless she is moving.  MRI shows a new 20% compression fracture of L1 just below her recent kyphoplasty of T12.  No other significant findings on imaging.  I discussed with the patient follow-up with Dr. Rudene Christians of orthopedics to discuss possible kyphoplasty in the future.  I will send a message to Dr. Rudene Christians so he is aware of the patient.  We will discharge with scheduled Tylenol every 6 hours in addition I will discharge with tramadol to be taken if needed for significant discomfort.  Patient is currently at a rehab facility.  Patient agreeable to plan for discharge home.    Harvest Dark, MD 05/03/20 [phone]

## 2020-05-03 NOTE — ED Notes (Signed)
Pt with EMS to return to [facility].

## 2020-05-03 NOTE — Discharge Instructions (Signed)
Please call the number provided for Dr. Rudene Christians to arrange a follow-up appointment to discuss your new fracture.  Return to the emergency department for any weakness or numbness of any leg, or any other symptom personally concerning to yourself.  As we discussed please take your Tylenol 1000 mg every 6 hours as needed for pain.  In addition to this you may take Ultram 50 mg if needed for significant pain.

## 2020-05-03 NOTE — ED Triage Notes (Signed)
Pt to ED via [facility] from [facility], pt c/o lower back pain, pt states recently had surgery done by Dr. Rudene Christians for broken vertebrae. Pt states pain worse with movement, pt denies new/recent injury. Pt states was only taking 2 tylenol for pain at [facility]. Pt sates mid back pain that radiates down to her butt.

## 2020-05-05 LAB — URINE CULTURE

## 2020-05-06 ENCOUNTER — Ambulatory Visit: Payer: Medicare HMO | Admitting: Podiatry

## 2020-05-14 ENCOUNTER — Encounter: Admission: RE | Admit: 2020-05-14 | Payer: Medicare HMO | Source: Ambulatory Visit

## 2020-05-14 ENCOUNTER — Other Ambulatory Visit: Payer: Self-pay

## 2020-05-14 NOTE — Patient Instructions (Signed)
Your procedure is scheduled on: 05/18/20 Report to [place]. To find out your arrival time please call [phone] between 1PM - 3PM on 05/17/20.  Remember: Instructions that are not followed completely may result in serious medical risk, up to and including death, or upon the discretion of your surgeon and anesthesiologist your surgery may need to be rescheduled.     _X__ 1. Do not eat food after midnight the night before your procedure.                 No gum chewing or hard candies. You may drink clear liquids up to 2 hours                 before you are scheduled to arrive for your surgery- DO not drink clear                 liquids within 2 hours of the start of your surgery.                 Clear Liquids include:  water, apple juice without pulp, clear carbohydrate                 drink such as Clearfast or Gatorade, Black Coffee or Tea (Do not add                 anything to coffee or tea). Diabetics water only  __X__2.  On the morning of surgery brush your teeth with toothpaste and water, you                 may rinse your mouth with mouthwash if you wish.  Do not swallow any              toothpaste of mouthwash.     _X__ 3.  No Alcohol for 24 hours before or after surgery.   _X__ 4.  Do Not Smoke or use e-cigarettes For 24 Hours Prior to Your Surgery.                 Do not use any chewable tobacco products for at least 6 hours prior to                 surgery.  ____  5.  Bring all medications with you on the day of surgery if instructed.   __X__  6.  Notify your doctor if there is any change in your medical condition      (cold, fever, infections).     Do not wear jewelry, make-up, hairpins, clips or nail polish. Do not wear lotions, powders, or perfumes.  Do not shave 48 hours prior to surgery. Men may shave face and neck. Do not bring valuables to the hospital.    [facility] is not responsible for any belongings or  valuables.  Contacts, dentures/partials or body piercings may not be worn into surgery. Bring a case for your contacts, glasses or hearing aids, a denture cup will be supplied. Leave your suitcase in the car. After surgery it may be brought to your room. For patients admitted to the hospital, discharge time is determined by your treatment team.   Patients discharged the day of surgery will not be allowed to drive home.   Please read over the following fact sheets that you were given:   MRSA Information, SAGE WIPES  __X__ Take these medicines the morning of surgery with A SIP OF WATER:  1. bisoprolol (ZEBETA) 5 MG tablet  2. levothyroxine (SYNTHROID, LEVOTHROID) 50 MCG tablet  3. traMADol (ULTRAM) 50 MG tablet IF NEEDED FOR PAIN  4.  5.  6.  ____ Fleet Enema (as directed)   __X__ Use CHG Soap/SAGE wipes as directed  ____ Use inhalers on the day of surgery  __X__ Stop metformin/Janumet/Farxiga 2 days prior to surgery    ____ Take 1/2 of usual insulin dose the night before surgery. No insulin the morning          of surgery.   __X__ Stop Blood Thinners Coumadin/Plavix/Xarelto/Pleta/Pradaxa/Eliquis/Effient/Aspirin  on   Or contact your Surgeon, Cardiologist or Medical Doctor regarding  ability to stop your blood thinners   (STOP AS PREVIOUSLY INSTRUCTED)  __X__ Stop Anti-inflammatories 7 days before surgery such as Advil, Ibuprofen, Motrin,  BC or Goodies Powder, Naprosyn, Naproxen, Aleve, Aspirin    __X__ Stop all herbal supplements, fish oil or vitamin E until after surgery.    ____ Bring C-Pap to the hospital.

## 2020-05-17 ENCOUNTER — Other Ambulatory Visit: Payer: Self-pay

## 2020-05-17 ENCOUNTER — Other Ambulatory Visit: Payer: Self-pay | Admitting: Orthopedic Surgery

## 2020-05-17 ENCOUNTER — Other Ambulatory Visit: Admission: RE | Admit: 2020-05-17 | Payer: Medicare HMO | Source: Ambulatory Visit

## 2020-05-17 ENCOUNTER — Other Ambulatory Visit
Admission: RE | Admit: 2020-05-17 | Discharge: 2020-05-17 | Disposition: A | Discharge status: Home / Self Care | Payer: Medicare HMO | Source: Ambulatory Visit | Attending: Orthopedic Surgery | Admitting: Orthopedic Surgery

## 2020-05-17 DIAGNOSIS — Z20822 Contact with and (suspected) exposure to covid-19: Secondary | ICD-10-CM | POA: Diagnosis not present

## 2020-05-17 DIAGNOSIS — Z01812 Encounter for preprocedural laboratory examination: Secondary | ICD-10-CM | POA: Diagnosis present

## 2020-05-18 ENCOUNTER — Ambulatory Visit: Payer: Medicare HMO | Admitting: Urgent Care

## 2020-05-18 ENCOUNTER — Encounter: Payer: Self-pay | Admitting: Orthopedic Surgery

## 2020-05-18 ENCOUNTER — Ambulatory Visit
Admission: RE | Admit: 2020-05-18 | Discharge: 2020-05-18 | Disposition: A | Discharge status: Home / Self Care | Payer: Medicare HMO | Attending: Orthopedic Surgery | Admitting: Orthopedic Surgery

## 2020-05-18 ENCOUNTER — Encounter
Admission: RE | Disposition: A | Discharge status: Home / Self Care | Payer: Self-pay | Source: Home / Self Care | Attending: Orthopedic Surgery

## 2020-05-18 ENCOUNTER — Ambulatory Visit: Payer: Medicare HMO

## 2020-05-18 DIAGNOSIS — Z79899 Other long term (current) drug therapy: Secondary | ICD-10-CM | POA: Diagnosis not present

## 2020-05-18 DIAGNOSIS — W19XXXA Unspecified fall, initial encounter: Secondary | ICD-10-CM | POA: Diagnosis not present

## 2020-05-18 DIAGNOSIS — Z880 Allergy status to penicillin: Secondary | ICD-10-CM | POA: Insufficient documentation

## 2020-05-18 DIAGNOSIS — Z885 Allergy status to narcotic agent status: Secondary | ICD-10-CM | POA: Insufficient documentation

## 2020-05-18 DIAGNOSIS — Z882 Allergy status to sulfonamides status: Secondary | ICD-10-CM | POA: Diagnosis not present

## 2020-05-18 DIAGNOSIS — Z419 Encounter for procedure for purposes other than remedying health state, unspecified: Secondary | ICD-10-CM

## 2020-05-18 DIAGNOSIS — Z79890 Hormone replacement therapy (postmenopausal): Secondary | ICD-10-CM | POA: Insufficient documentation

## 2020-05-18 DIAGNOSIS — Z881 Allergy status to other antibiotic agents status: Secondary | ICD-10-CM | POA: Insufficient documentation

## 2020-05-18 DIAGNOSIS — Z7984 Long term (current) use of oral hypoglycemic drugs: Secondary | ICD-10-CM | POA: Insufficient documentation

## 2020-05-18 DIAGNOSIS — Z888 Allergy status to other drugs, medicaments and biological substances status: Secondary | ICD-10-CM | POA: Diagnosis not present

## 2020-05-18 DIAGNOSIS — Z7901 Long term (current) use of anticoagulants: Secondary | ICD-10-CM | POA: Diagnosis not present

## 2020-05-18 DIAGNOSIS — Z87892 Personal history of anaphylaxis: Secondary | ICD-10-CM | POA: Insufficient documentation

## 2020-05-18 DIAGNOSIS — S32010A Wedge compression fracture of first lumbar vertebra, initial encounter for closed fracture: Secondary | ICD-10-CM | POA: Diagnosis not present

## 2020-05-18 HISTORY — PX: KYPHOPLASTY: SHX5884

## 2020-05-18 LAB — SARS CORONAVIRUS 2 (TAT 6-24 HRS): SARS Coronavirus 2: NEGATIVE

## 2020-05-18 LAB — GLUCOSE, CAPILLARY
Glucose-Capillary: 186 mg/dL — ABNORMAL HIGH (ref 70–99)
Glucose-Capillary: 193 mg/dL — ABNORMAL HIGH (ref 70–99)

## 2020-05-18 SURGERY — KYPHOPLASTY
Anesthesia: General | Site: Spine Lumbar

## 2020-05-18 MED ORDER — ACETAMINOPHEN 500 MG PO TABS
ORAL_TABLET | ORAL | Status: AC
  Filled 2020-05-18: qty 2

## 2020-05-18 MED ORDER — ONDANSETRON HCL 4 MG PO TABS: 4.0000 mg | ORAL_TABLET | Freq: Four times a day (QID) | ORAL | Status: DC | PRN

## 2020-05-18 MED ORDER — ONDANSETRON HCL 4 MG/2ML IJ SOLN
INTRAMUSCULAR | Status: AC
  Filled 2020-05-18: qty 2

## 2020-05-18 MED ORDER — ACETAMINOPHEN 500 MG PO TABS
1000.0000 mg | ORAL_TABLET | Freq: Four times a day (QID) | ORAL | Status: DC | PRN
  Administered 2020-05-18: 1000 mg via ORAL

## 2020-05-18 MED ORDER — FAMOTIDINE 20 MG PO TABS
ORAL_TABLET | ORAL | Status: AC
  Filled 2020-05-18: qty 1

## 2020-05-18 MED ORDER — ORAL CARE MOUTH RINSE: 15.0000 mL | Freq: Once | OROMUCOSAL | Status: DC

## 2020-05-18 MED ORDER — PROPOFOL 500 MG/50ML IV EMUL
INTRAVENOUS | Status: AC
  Filled 2020-05-18: qty 50

## 2020-05-18 MED ORDER — CHLORHEXIDINE GLUCONATE 0.12 % MT SOLN: 15.0000 mL | Freq: Once | OROMUCOSAL | Status: DC

## 2020-05-18 MED ORDER — METOCLOPRAMIDE HCL 10 MG PO TABS: 5.0000 mg | ORAL_TABLET | Freq: Three times a day (TID) | ORAL | Status: DC | PRN

## 2020-05-18 MED ORDER — BUPIVACAINE-EPINEPHRINE 0.25% -1:200000 IJ SOLN
INTRAMUSCULAR | Status: DC | PRN
  Administered 2020-05-18: 25 mL

## 2020-05-18 MED ORDER — TRAMADOL HCL 50 MG PO TABS: 50.0000 mg | ORAL_TABLET | Freq: Four times a day (QID) | ORAL | 0 refills | Status: DC | PRN

## 2020-05-18 MED ORDER — ONDANSETRON HCL 4 MG/2ML IJ SOLN: 4.0000 mg | Freq: Once | INTRAMUSCULAR | Status: DC | PRN

## 2020-05-18 MED ORDER — LIDOCAINE HCL (PF) 1 % IJ SOLN
INTRAMUSCULAR | Status: AC
  Filled 2020-05-18: qty 60

## 2020-05-18 MED ORDER — IOHEXOL 180 MG/ML  SOLN
INTRAMUSCULAR | Status: DC | PRN
  Administered 2020-05-18: 40 mL

## 2020-05-18 MED ORDER — ONDANSETRON HCL 4 MG/2ML IJ SOLN
INTRAMUSCULAR | Status: DC | PRN
  Administered 2020-05-18: 4 mg via INTRAVENOUS

## 2020-05-18 MED ORDER — PROPOFOL 10 MG/ML IV BOLUS
INTRAVENOUS | Status: DC | PRN
  Administered 2020-05-18: 30 mg via INTRAVENOUS

## 2020-05-18 MED ORDER — KETAMINE HCL 50 MG/ML IJ SOLN
INTRAMUSCULAR | Status: DC | PRN
  Administered 2020-05-18: 15 mg via INTRAVENOUS
  Administered 2020-05-18: 10 mg via INTRAVENOUS

## 2020-05-18 MED ORDER — METOCLOPRAMIDE HCL 5 MG/ML IJ SOLN: 5.0000 mg | Freq: Three times a day (TID) | INTRAMUSCULAR | Status: DC | PRN

## 2020-05-18 MED ORDER — ONDANSETRON HCL 4 MG/2ML IJ SOLN: 4.0000 mg | Freq: Four times a day (QID) | INTRAMUSCULAR | Status: DC | PRN

## 2020-05-18 MED ORDER — FENTANYL CITRATE (PF) 100 MCG/2ML IJ SOLN
INTRAMUSCULAR | Status: AC
  Filled 2020-05-18: qty 2

## 2020-05-18 MED ORDER — FAMOTIDINE 20 MG PO TABS: 20.0000 mg | ORAL_TABLET | Freq: Once | ORAL | Status: DC

## 2020-05-18 MED ORDER — PROPOFOL 500 MG/50ML IV EMUL
INTRAVENOUS | Status: DC | PRN
  Administered 2020-05-18: 25 ug/kg/min via INTRAVENOUS

## 2020-05-18 MED ORDER — CLINDAMYCIN PHOSPHATE 900 MG/50ML IV SOLN
INTRAVENOUS | Status: AC
  Filled 2020-05-18: qty 50

## 2020-05-18 MED ORDER — FENTANYL CITRATE (PF) 100 MCG/2ML IJ SOLN: 25.0000 ug | INTRAMUSCULAR | Status: DC | PRN

## 2020-05-18 MED ORDER — CLINDAMYCIN PHOSPHATE 900 MG/50ML IV SOLN
900.0000 mg | INTRAVENOUS | Status: AC
  Administered 2020-05-18: 900 mg via INTRAVENOUS

## 2020-05-18 MED ORDER — LIDOCAINE HCL 1 % IJ SOLN
INTRAMUSCULAR | Status: DC | PRN
  Administered 2020-05-18: 35 mL

## 2020-05-18 MED ORDER — SODIUM CHLORIDE 0.9 % IV SOLN: INTRAVENOUS | Status: DC

## 2020-05-18 MED ORDER — FENTANYL CITRATE (PF) 100 MCG/2ML IJ SOLN
INTRAMUSCULAR | Status: DC | PRN
  Administered 2020-05-18 (×2): 25 ug via INTRAVENOUS

## 2020-05-18 MED ORDER — KETAMINE HCL 50 MG/5ML IJ SOSY
PREFILLED_SYRINGE | INTRAMUSCULAR | Status: AC
  Filled 2020-05-18: qty 5

## 2020-05-18 MED ORDER — BUPIVACAINE-EPINEPHRINE (PF) 0.25% -1:200000 IJ SOLN
INTRAMUSCULAR | Status: AC
  Filled 2020-05-18: qty 30

## 2020-05-18 SURGICAL SUPPLY — 21 items
ADH SKN CLS APL DERMABOND .7 (GAUZE/BANDAGES/DRESSINGS) ×1
CEMENT KYPHON CX01A KIT/MIXER (Cement) ×2 IMPLANT
COVER WAND RF STERILE (DRAPES) ×2 IMPLANT
DERMABOND ADVANCED (GAUZE/BANDAGES/DRESSINGS) ×1
DERMABOND ADVANCED .7 DNX12 (GAUZE/BANDAGES/DRESSINGS) ×1 IMPLANT
DEVICE BIOPSY BONE KYPHX (INSTRUMENTS) ×2 IMPLANT
DRAPE C-ARM XRAY 36X54 (DRAPES) ×2 IMPLANT
DURAPREP 26ML APPLICATOR (WOUND CARE) ×2 IMPLANT
GLOVE SURG SYN 9.0  PF PI (GLOVE) ×2
GLOVE SURG SYN 9.0 PF PI (GLOVE) ×1 IMPLANT
GOWN SRG 2XL LVL 4 RGLN SLV (GOWNS) ×1 IMPLANT
GOWN STRL NON-REIN 2XL LVL4 (GOWNS) ×2
GOWN STRL REUS W/ TWL LRG LVL3 (GOWN DISPOSABLE) ×1 IMPLANT
GOWN STRL REUS W/TWL LRG LVL3 (GOWN DISPOSABLE) ×2
MANIFOLD NEPTUNE II (INSTRUMENTS) ×2 IMPLANT
PACK KYPHOPLASTY (MISCELLANEOUS) ×2 IMPLANT
RENTAL RFA GENERATOR (MISCELLANEOUS) IMPLANT
STRAP SAFETY 5IN WIDE (MISCELLANEOUS) ×2 IMPLANT
SWABSTK COMLB BENZOIN TINCTURE (MISCELLANEOUS) ×2 IMPLANT
TRAY KYPHOPAK 15/3 EXPRESS 1ST (MISCELLANEOUS) ×2 IMPLANT
TRAY KYPHOPAK 20/3 EXPRESS 1ST (MISCELLANEOUS) ×1 IMPLANT

## 2020-05-18 NOTE — Transfer of Care (Signed)
Immediate Anesthesia Transfer of Care Note  Patient: Jodi Reeves  Procedure(s) Performed: L1 KYPHOPLASTY (N/A Spine Lumbar)  Patient Location: PACU  Anesthesia Type:General  Level of Consciousness: awake and alert   Airway & Oxygen Therapy: Patient Spontanous Breathing  Post-op Assessment: Report given to RN and Post -op Vital signs reviewed and stable  Post vital signs: Reviewed  Last Vitals:  Vitals Value Taken Time  BP    Temp    Pulse    Resp    SpO2      Last Pain:  Vitals:   05/18/20 1043  TempSrc: Oral  PainSc: 5          Complications: No complications documented.

## 2020-05-18 NOTE — OR Nursing (Signed)
Dr Rudene Christians in to talk with patient and son regarding pain management at home.  Tylenol 1000 mg given and patient desires discharge at this time.

## 2020-05-18 NOTE — Op Note (Signed)
05/18/2020  12:07 PM  PATIENT:  Jodi Reeves   MRN: [id number]   PRE-OPERATIVE DIAGNOSIS:  closed wedge compression fracture of L1   POST-OPERATIVE DIAGNOSIS:  closed wedge compression fracture of L1   PROCEDURE:  Procedure(s): KYPHOPLASTY L1  SURGEON: Laurene Footman, MD   ASSISTANTS: None   ANESTHESIA:   local and MAC   EBL:  No intake/output data recorded.   BLOOD ADMINISTERED:none   DRAINS: none    LOCAL MEDICATIONS USED:  MARCAINE    and XYLOCAINE    SPECIMEN:  none   DISPOSITION OF SPECIMEN: n/a   COUNTS:  YES   TOURNIQUET:  * No tourniquets in log *   IMPLANTS: Bone cement   DICTATION: .Dragon Dictation  patient was brought to the operating room and after adequate anesthesia was obtained the patient was placed prone.  C arm was brought in in good visualization of the affected level obtained on both AP and lateral projections.  After patient identification and timeout procedures were completed, local anesthetic was infiltrated with 10 cc 1% Xylocaine infiltrated subcutaneously.  This is done the area on the each side of the planned approach.  The back was then prepped and draped in the usual sterile manner and repeat timeout procedure carried out.  A spinal needle was brought down to the pedicle on the each side of L1 and a 50-50 mix of 1% Xylocaine half percent Sensorcaine with epinephrine total of 20 cc injected on each side.  After allowing this to set a small incision was made and the trocar was advanced into the vertebral body in an extrapedicular fashion.  Biopsy was not obtained  Drilling was carried out balloon inserted with inflation to 3 cc on the right and 3 cc on the left.  When the cement was appropriate consistency 4.5 cc were injected on the right and 3 cc on the left into the vertebral body without extravasation, good fill superior to inferior endplates and from right to left sides along the inferior endplate.  After the cement had set the trochar was  removed and permanent C-arm views obtained.  The wound was closed with Dermabond followed by [place]: discharge to home after RR   PATIENT DISPOSITION:  PACU - hemodynamically stable.

## 2020-05-18 NOTE — Anesthesia Preprocedure Evaluation (Addendum)
Anesthesia Evaluation  Patient identified by MRN, date of birth, ID band Patient awake    Reviewed: Allergy & Precautions, NPO status , Patient's Chart, lab work & pertinent test results  History of Anesthesia Complications Negative for: history of anesthetic complications  Airway Mallampati: III  TM Distance: >3 FB Neck ROM: Full    Dental  (+) Teeth Intact, Poor Dentition   Pulmonary neg pulmonary ROS, neg sleep apnea, neg COPD, Patient abstained from smoking.Not current smoker,    Pulmonary exam normal breath sounds clear to auscultation       Cardiovascular Exercise Tolerance: Poor METShypertension, + CAD, + Peripheral Vascular Disease and +CHF  (-) Past MI (-) dysrhythmias + Valvular Problems/Murmurs  Rhythm:Irregular Rate:Normal - Systolic murmurs    Neuro/Psych PSYCHIATRIC DISORDERS Anxiety TIA Neuromuscular disease    GI/Hepatic GERD  ,(+)     (-) substance abuse  ,   Endo/Other  diabetesHypothyroidism   Renal/GU negative Renal ROS     Musculoskeletal   Abdominal   Peds  Hematology   Anesthesia Other Findings Past Medical History: No date: Allergy No date: Anxiety No date: Balance disorder No date: Chronic diastolic CHF (congestive heart failure) (HCC) No date: Colon polyps No date: Coronary artery disease, non-occlusive     Comment:  a. LHC 11/2003: 10% LAD stenosis, 20% LCx stenosis, and               40% RCA stenosis; b. nuc stress test 12/15: small region               of mild ischemia in the apical region with WMA also noted              in the apical region, EF 60%. 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 CHADS2VASc = > 8 (CHF, HTN, age x 2, DM, TIA x 2,               female); b. on Xarelto No date: SBO (small bowel obstruction) (HCC)     Comment:  1998 No date: TIA (transient ischemic attack) No date: UTI (urinary tract infection) No date: Venous insufficiency  Reproductive/Obstetrics                             Anesthesia Physical  Anesthesia Plan  ASA: III  Anesthesia Plan: General   Post-op Pain Management:    Induction: Intravenous  PONV Risk Score and Plan: 3 and Ondansetron, Propofol infusion and   TIVA  Airway Management Planned: Nasal Cannula and Natural Airway  Additional Equipment: None  Intra-op Plan:   Post-operative Plan:   Informed Consent: I have reviewed the patients History and Physical, chart, labs and discussed the procedure including the risks, benefits and alternatives for the proposed anesthesia with the patient or authorized representative who has indicated his/her understanding and acceptance.     Dental advisory given  Plan Discussed with: CRNA and Surgeon  Anesthesia Plan Comments: (Discussed risks of anesthesia with patient, including possibility of difficulty with spontaneous ventilation under anesthesia necessitating airway intervention, PONV, and rare risks such as cardiac or respiratory or neurological events. Patient understands.)        Anesthesia Quick Evaluation                                   Anesthesia Evaluation  Patient identified by MRN, date of birth, ID band Patient awake    Reviewed: Allergy & Precautions, NPO status , Patient's Chart, lab work & pertinent test results  History of Anesthesia Complications Negative for: history of anesthetic complications  Airway Mallampati: III  TM Distance: >3 FB Neck ROM: Full    Dental   Bridges :   Pulmonary neg pulmonary ROS,    Pulmonary exam normal breath sounds clear to auscultation       Cardiovascular hypertension, + CAD and +CHF  + dysrhythmias (a fib on xarelto)  Rhythm:Irregular Rate:Normal  ECG 08/21/17: atrial fibrillation; left axis deviation; septal infarct, age undetermined  Myocardial perfusion 04/06/17:   Horizontal ST segment depression ST segment depression of 1 mm was noted during stress in the II, III and aVF leads.  The study is normal.  This is a low risk study.  The left ventricular ejection fraction is hyperdynamic (>65%).   Neuro/Psych PSYCHIATRIC DISORDERS Anxiety Depression TIAnegative neurological ROS     GI/Hepatic GERD  ,Esophageal web, hx SBO, IBS   Endo/Other  diabetes, Type 2Hypothyroidism   Renal/GU negative Renal ROS     Musculoskeletal   Abdominal   Peds  Hematology negative hematology ROS (+)   Anesthesia Other Findings Cardiology note 08/21/17:  ASSESSMENT AND PLAN:  Mixed hyperlipidemia  CT scan including mild coronary calcifications, aortic arch calcifications, moderate aortic atherosclerosis in the abdominal aorta  does not want a statin stable  No further work-up  Essential hypertension, benign - Plan: EKG 12-Lead Blood pressure is well controlled on today's visit. No changes made to the medications.  Persistent atrial fibrillation (HCC) Heart rate well controlled,  Tolerating Xarelto, no recent falls No changes made  Dark urine She is concerned about hematuria but no proof of this Recommended if she has any other days with very  dark urine that she contact primary care, may need urinalysis  if she does have confirmation of hematuria would likely benefit from urology but currently reports urine is back to normal color  Chronic diastolic CHF (congestive heart failure) (HCC) Takes Lasix daily, occasionally with extra dose potassium daily Appears euvolemic   Total encounter time more than 45 minutes  Greater than 50% was spent in counseling and coordination of care with the patient  Disposition:   F/U  12 months  Reproductive/Obstetrics                            Anesthesia Physical Anesthesia Plan  ASA: III  Anesthesia Plan: General and   Bier Block and Bier Block-LIDOCAINE ONLY   Post-op Pain Management:  Regional for Post-op pain and GA combined w/ Regional for post-op pain   Induction: Intravenous  PONV Risk Score and Plan: 3 and Propofol infusion and TIVA  Airway Management Planned: Natural Airway  Additional Equipment:   Intra-op Plan:   Post-operative Plan:   Informed Consent: I have reviewed the patients History and Physical, chart, labs and discussed the procedure including the risks, benefits and alternatives for the proposed anesthesia with the patient or authorized representative who has indicated his/her understanding and acceptance.     Plan Discussed with: CRNA  Anesthesia Plan Comments:         Anesthesia Quick Evaluation   

## 2020-05-18 NOTE — Progress Notes (Signed)
Per Dr. Rudene Christians in pre-op pt can resume her Xarelto tonight or tomorrow morning.

## 2020-05-18 NOTE — Discharge Instructions (Addendum)
AMBULATORY SURGERY  DISCHARGE INSTRUCTIONS   1) The drugs that you were given will stay in your system until tomorrow so for the next 24 hours you should not:  A) Drive an automobile B) Make any legal decisions C) Drink any alcoholic beverage   2) You may resume regular meals tomorrow.  Today it is better to start with liquids and gradually work up to solid foods.  You may eat anything you prefer, but it is better to start with liquids, then soup and crackers, and gradually work up to solid foods.   3) Please notify your doctor immediately if you have any unusual bleeding, trouble breathing, redness and pain at the surgery site, drainage, fever, or pain not relieved by medication.    4) Additional Instructions:        Please contact your physician with any problems or Same Day Surgery at [phone], Monday through Friday 6 am to 4 pm, or [place] at [facility] number at [phone]. Take it easy today and tomorrow. Resume blood thinner today

## 2020-05-18 NOTE — H&P (Signed)
Chief Complaint: No chief complaint on file.  Jodi Reeves is a 87 y.o. female who presents today status post T12 kyphoplasty by Dr. Hessie Knows on 04/13/2020. Patient is at [employer]. She is undergoing physical therapy. States she has been able to ambulate 300 feet with a walker. Prior to her compression fracture she was walking some with a cane, using a lot of furniture within the house to walk. She states she is not quite back to her baseline. A lot of her pain is much improved, only taking Tylenol. No radicular symptoms. Subsequent to her prior kyphoplasty she has had another fall and had MRI showing L1 compression fracture  Past Medical History: Past Medical History:  Diagnosis Date  . Carpal tunnel syndrome 03/28/2017  . Chronic gastritis 07/13/2014  . Diabetes mellitus type 2, uncomplicated (CMS-HCC)  . Tubular adenoma of colon, unspecified 07/13/2014   Past Surgical History: Past Surgical History:  Procedure Laterality Date  . broken arm Left 12/2016  . COLONOSCOPY 07/13/2014  Tubular adenoma of colon/No Repeat/PYO  . CYSTECTOMY april 2015  . EGD 07/13/2014  Chronic gastritis/No Repeat/PYO  . Endoscopic left carpal tunnel release Left 03/06/2018  Dr.Poggi  . KYPHOPLASTY N/A 04/13/2020  Dr. Rudene Christians (T12)  . Reverse left total shoulder arthroplasty Left 10/06/2014  Dr.Poggi   Past Family History: Family History  Problem Relation Age of Onset  . Stroke Mother  . Diabetes Mother  . Myocardial Infarction (Heart attack) Father  . Diabetes Brother  . Hypothyroidism Neg Hx   Medications: Current Outpatient Medications Ordered in Epic  Medication Sig Dispense Refill  . collagenase (SANTYL) ointment Apply topically  . polyethylene glycol (MIRALAX) powder Take by mouth  . prednisoLONE acetate (PRED FORTE) 1 % ophthalmic suspension Apply 1 drop to eye 4 (four) times daily  . sennosides-docusate (SENOKOT-S) 8.6-50 mg tablet Take by mouth  . ascorbic acid (VITAMIN C)  1000 MG tablet Take 1,000 mg by mouth once daily.  . bisoprolol (ZEBETA) 5 MG tablet Take 5 mg by mouth once daily  . bisoprolol-hydrochlorothiazide (ZIAC) 2.5-6.25 mg tablet Take 1 tablet by mouth once daily. (Patient not taking: Reported on 03/04/2020 )  . blood glucose diagnostic (ONETOUCH ULTRA TEST) test strip Use once daily 100 strip 3  . CALCIUM CITRATE ORAL Take 1 tablet by mouth once daily (Patient not taking: Reported on 03/04/2020 )  . coenzyme Q10 (CO Q-10) 10 mg capsule Take 10 mg by mouth once daily. (Patient not taking: Reported on 03/04/2020 )  . cyanocobalamin (VITAMIN B12) 1000 MCG tablet Take 1,000 mcg by mouth once daily  . cyclobenzaprine (FLEXERIL) 10 MG tablet cyclobenzaprine 10 mg tablet Take 1 tablet 3 times a day by oral route.  . dicyclomine (BENTYL) 10 mg capsule  . docusate (COLACE) 100 MG capsule Take 100 mg by mouth once daily (Patient not taking: Reported on 03/04/2020 )  . doxycycline (VIBRAMYCIN) 100 MG capsule  . estradioL (ESTRACE) 0.01 % (0.1 mg/gram) vaginal cream Place 1 g vaginally Monday, Wednesday and Friday.  . famotidine (PEPCID) 20 MG tablet Take 20 mg by mouth once daily as needed. (Patient not taking: Reported on 03/04/2020 )  . folic acid-vit 0000000 123456 2.2-25-0.5 mg Tab Place 1 tablet under the tongue once daily  . FUROsemide (LASIX) 20 MG tablet Take 20 mg by mouth once daily  . glimepiride (AMARYL) 2 MG tablet Take 1 tablet (2 mg total) by mouth daily with breakfast (Patient not taking: Reported on 09/30/2019 ) 30 tablet 6  .  levoFLOXacin (LEVAQUIN) 750 MG tablet  . linaGLIPtin (TRADJENTA) 5 mg tablet Take 1 tablet (5 mg total) by mouth once daily 30 tablet 11  . losartan (COZAAR) 25 MG tablet Take 12.5 mg by mouth once daily as needed  . magnesium oxide (MAG-OX) 400 mg (241.3 mg magnesium) tablet Take by mouth  . metFORMIN (GLUCOPHAGE-XR) 500 MG XR tablet Take 2 tablets (1,000 mg total) by mouth 2 (two) times daily (Patient taking differently:  Take 1,000 mg by mouth once daily ) 120 tablet 11  . mirabegron (MYRBETRIQ) 25 mg ER Tablet Take 25 mg by mouth once daily (Patient not taking: Reported on 09/30/2019 )  . multivitamin tablet Take 1 tablet by mouth once daily.  Marland Kitchen olopatadine (PATADAY) 0.2 % ophthalmic solution 1 drop once daily  . omega-3 acid ethyl esters (LOVAZA) 1 gram capsule Take 1 g by mouth once daily.  . potassium chloride (KLOR-CON) 10 MEQ ER tablet Take 10 mEq by mouth once daily  . propylene glycol (SYSTANE BALANCE) 0.6 % ophthalmic drops Place 1 drop into both eyes as needed for Dry Eyes  . psyllium, aspartame, (METAMUCIL) 3.4 gram packet Take by mouth  . rivaroxaban (XARELTO) 20 mg tablet Take 20 mg by mouth once daily.  Marland Kitchen SYNTHROID 50 mcg tablet Take 1 tablet (50 mcg total) by mouth once daily Take on an empty stomach with a glass of water at least 30-60 minutes before breakfast. 90 tablet 3   No current Epic-ordered facility-administered medications on file.   Allergies: Allergies  Allergen Reactions  . Penicillins Anaphylaxis  Other reaction(s): ANAPHYLAXIS REACTION: Anaphylaxis  . Alendronate Unknown  . Amiodarone Other (See Comments)  Thyroid issues  . Atorvastatin Unknown  REACTION: Muscle aches  . Flonase [Fluticasone] Other (See Comments)  . Glimepiride Unknown  Not effective  . Nitrofurantoin Macrocrystal Unknown  Itching  . Pioglitazone Unknown  Not effective  . Proton Pump Inhibitors Unknown  Esophageal problems   . Protonix [Pantoprazole] Unknown  . Statins-Hmg-Coa Reductase Inhibitors Other (See Comments)  Muscle aches to all  . Alendronate Sodium Unknown and Rash  REACTION: Rash Dysphagia and ulceration  . Ciprofloxacin Muscle Pain and Other (See Comments)  High blood pressure High blood pressure, aches chest pressure  . Codeine Vomiting and Nausea And Vomiting  REACTION: N \\T \ V  . Ezetimibe-Simvastatin Unknown, Nausea And Vomiting and Other (See Comments)  REACTION: Muscle  aches REACTION: Muscle aches Muscle aches, nausea, back pain   . Liraglutide Nausea  Nausea  . Lovastatin Unknown  REACTION: Myalgias  . Rosiglitazone Maleate Unknown  REACTION: Edema  . Sulfa (Sulfonamide Antibiotics) Unknown and Rash  Itching  . Sulfamethoxazole-Trimethoprim Rash  itching    Review of Systems:  A comprehensive 14 point ROS was performed, reviewed by me today, and the pertinent orthopaedic findings are documented in the HPI.  Exam: There were no vitals taken for this visit. General/Constitutional: The patient appears to be well-nourished, well-developed, and in no acute distress. Neuro/Psych: Normal mood and affect, oriented to person, place and time. Eyes: Non-icteric. Pupils are equal, round, and reactive to light, and exhibit synchronous movement. ENT: Unremarkable. Lymphatic: No palpable adenopathy. Respiratory: Non-labored breathing Cardiovascular: No edema, swelling or tenderness, except as noted in detailed exam. Integumentary: No impressive skin lesions present, except as noted in detailed exam. Musculoskeletal: Unremarkable, except as noted in detailed exam.  Examination of the thoracolumbar spine shows no spinous process tenderness with percussion. Dermabond removed from incision sites. Incision sites appear well with no  signs of any infection.  AP and lateral views of the thoracolumbar spine are ordered interpreted by me in the office today. Impression: Patient has methylmethacrylate present along T12 vertebral body with good placement. There is no new compression fracture. Moderate lower lumbar spondylosis with mild to space degeneration. Mild endplate vertebral body spurring.  Impression: S/P kyphoplasty [Z98.890] S/P kyphoplasty T12 (primary encounter diagnosis) New L1 compression fracture  Plan:  37. 87 year old female status post T12 kyphoplasty. She is doing well. Pain improved. Mobility is improving. She is taken Tylenol for pain. Recommend  she continue to progress activity as tolerated continue with physical therapy and follow-up in 4 weeks  2.  New L1 compression fracture she has been off her anticoagulation and is brought in now for kyphoplasty L1 This note was generated in part with voice recognition software and I apologize for any typographical errors that were not detected and corrected.  Feliberto Gottron MPA-C    Electronically signed by Feliberto Gottron, PA at 04/28/2020 8:24 AM EST   Reviewed  H+P. No changes noted.

## 2020-05-19 ENCOUNTER — Encounter: Payer: Self-pay | Admitting: Orthopedic Surgery

## 2020-05-19 NOTE — Anesthesia Postprocedure Evaluation (Signed)
Anesthesia Post Note  Patient: Jodi Reeves  Procedure(s) Performed: L1 KYPHOPLASTY (N/A Spine Lumbar)  Patient location during evaluation: PACU Anesthesia Type: General Level of consciousness: awake and alert and oriented Pain management: pain level controlled Vital Signs Assessment: post-procedure vital signs reviewed and stable Respiratory status: spontaneous breathing Cardiovascular status: blood pressure returned to baseline Anesthetic complications: no   No complications documented.   Last Vitals:  Vitals:   05/18/20 1317 05/18/20 1353  BP: (!) 152/91 (!) 149/89  Pulse:  92  Resp: 16 16  Temp: 36.6 C 37 C  SpO2: 98% 98%    Last Pain:  Vitals:   05/19/20 0844  TempSrc:   PainSc: 5                  Launa Goedken

## 2020-05-26 ENCOUNTER — Telehealth: Payer: Self-pay

## 2020-05-26 NOTE — Telephone Encounter (Signed)
Faxed signed order # [id number] to [facility] [id number], [id number] PT 1 [id number]

## 2020-05-26 NOTE — Telephone Encounter (Signed)
Faxed signed order #[id number] and order #[id number] effective 05/23/20 1wk1 add-on eval cert dates 0/10/93-2/35/57. Faxed to [employer] [phone]

## 2020-05-28 ENCOUNTER — Encounter: Payer: Self-pay | Admitting: Internal Medicine

## 2020-05-28 ENCOUNTER — Telehealth: Payer: Self-pay | Admitting: Internal Medicine

## 2020-05-28 NOTE — Telephone Encounter (Signed)
Pt called she needs a letter of hardship for the post office so her mailbox can be moved closer to her house

## 2020-05-28 NOTE — Telephone Encounter (Signed)
Please advise 

## 2020-05-28 NOTE — Telephone Encounter (Signed)
Letter in box. 

## 2020-05-31 NOTE — Telephone Encounter (Signed)
Spoke with Patient's son Octavia Bruckner and was informed to fax completed letter to [phone] and then place in the mail afterward. Letter sent to be faxed. Will mail once confirmation is received

## 2020-06-01 ENCOUNTER — Telehealth: Payer: Self-pay

## 2020-06-01 NOTE — Telephone Encounter (Signed)
Spoke with Anderson Malta from [place] and she was following up on a hospital bed for patient and needs a verbal for speech therapy. I let her know we need for her to send [place] the order via fax or she can bring it by the office.

## 2020-06-01 NOTE — Addendum Note (Signed)
Addended by: Orland Mustard on: 06/01/2020 01:15 PM   Modules accepted: Orders

## 2020-06-01 NOTE — Telephone Encounter (Signed)
Noted will await fax for hospital bed and speech therapy.  Was informed that orders will be faxed over

## 2020-06-02 ENCOUNTER — Telehealth: Payer: Self-pay

## 2020-06-02 ENCOUNTER — Telehealth: Payer: Self-pay | Admitting: Internal Medicine

## 2020-06-02 NOTE — Telephone Encounter (Signed)
Faxed signed order numbers [id number], [id number], [id number], and [id number] to [facility]  @ [phone] on 12/26/69 Cert dates 2/52/71 to 07/06/20

## 2020-06-02 NOTE — Telephone Encounter (Signed)
Written order, demographics, last note from 02/2020 faxed to [facility] medical supply for hospital bed.

## 2020-06-02 NOTE — Telephone Encounter (Signed)
Ok. Thank you.

## 2020-06-02 NOTE — Telephone Encounter (Signed)
H/h SLP was to [employer] Thank you

## 2020-06-08 ENCOUNTER — Telehealth: Payer: Self-pay | Admitting: Cardiovascular Disease

## 2020-06-08 NOTE — Telephone Encounter (Signed)
Faxed signed orders [id number], [id number], and [id number] to [facility] [phone] on 5/97/41 Cert dates 6/38/45-3/64/68

## 2020-06-08 NOTE — Telephone Encounter (Signed)
Patient bp log     2/07  99/67  86  Relaxed position 2/08  134/79  105 2/09  138/83  98 2/10  116/68  107 2/11  132/87  95  2/12  123/82  96 2/13  131/86  108 2/14  135/86  101 2/15  136/77  86   Patient aware if bp greater than 130 to take 1/2 losartan .  And patient has been taking this everyday as numbers are above 130 .  Please call to advise.

## 2020-06-09 ENCOUNTER — Telehealth: Payer: Self-pay | Admitting: Internal Medicine

## 2020-06-09 NOTE — Telephone Encounter (Signed)
Fax successfully went through 2/09/2 at 11:55 am. To [phone]. Number given by Home health nurse as a fax for medical supply company.

## 2020-06-09 NOTE — Telephone Encounter (Signed)
Received a fax from [facility] with [facility] states that the son, Octavia Bruckner, would like a copy of the order for a hospital bed given to him and to call the son when this is ready.  This has been confirmed faxed to medical supply company 06/02/20. Called and informed the son of this and that I have a copy for him in office.   Son states he did not ask for this but would like to have a copy on hand anyway. He would like this mailed to:  [facility] [address] [place], [place] [address].  Done.

## 2020-06-09 NOTE — Telephone Encounter (Signed)
Was able to reach out to pt via phone, advised we have received her BP record, overall looks okay, still some high numbers over 779T systolic. Reminded pt taking another half dose of Losartan for elevated BP. Pt voiced understanding, reports sometimes it can be elevated cause she been "out moving around and trying to get going". Reminded pt better to take BP while at rest for at least 30 mins. Pt verbalized understanding. Will forward readings to Dr. Rockey Situ for review. Pt reports no s/s of chest pain or other discomfort. Nothing further at this time.

## 2020-06-12 ENCOUNTER — Other Ambulatory Visit: Payer: Self-pay | Admitting: Cardiovascular Disease

## 2020-06-13 NOTE — Telephone Encounter (Signed)
Would increase bisoprolol up to 5 mg twice a day Heart rate mildly elevated

## 2020-06-14 ENCOUNTER — Telehealth: Payer: Self-pay

## 2020-06-14 ENCOUNTER — Telehealth: Payer: Self-pay | Admitting: Internal Medicine

## 2020-06-14 MED ORDER — BISOPROLOL FUMARATE 5 MG PO TABS: 5.0000 mg | ORAL_TABLET | Freq: Two times a day (BID) | ORAL | 3 refills | Status: DC

## 2020-06-14 NOTE — Telephone Encounter (Signed)
Left message for patient to call back and schedule Medicare Annual Wellness Visit (AWV)   This should be a virtual visit only=30 minutes.  Last AWV 04/22/19; please schedule at anytime with Denisa O'Brien-Blaney at [facility].

## 2020-06-14 NOTE — Telephone Encounter (Signed)
Pt last saw Dr Rockey Situ 03/15/20, last labs 05/03/20 Creat 0.64, age 87, weight 74.8kg, CrCl 68.99, based on specified criteria pt is on appropriate dosage of Xarelto 20mg  QD.  Will refill rx.

## 2020-06-14 NOTE — Telephone Encounter (Signed)
Attempted to reach out to pt, pt's son Tim answer (DPR approved). Advised that Dr. Rockey Situ would like to increase Mrs. Moisan's Bisoprolol to 5 mg BID from her once a day d/t mildly elevated HR with her BP recording last week. Tim verbalized understanding, refills and new script sent in to pharmacy as well, pt had no refills left on her Bisoprolol. Tim grateful for phone call, will let Mrs. Adamek know on her new dosing. Nothing further at this time.

## 2020-06-14 NOTE — Telephone Encounter (Signed)
-----   Message from Minna Merritts, MD sent at 06/13/2020  6:48 PM EST -----   ----- Message ----- From: Wynema Birch, RN Sent: 06/09/2020   8:47 AM EST To: Minna Merritts, MD  Pt called with BP readings. Spoke with pt, sometimes HR and BP elevated d/t nurse aide has been helping pt. Pt reports has been taking extra half dose of Losartan when BP >130s Please advise, Thanks, Mandie

## 2020-06-16 ENCOUNTER — Telehealth: Payer: Self-pay

## 2020-06-16 NOTE — Telephone Encounter (Signed)
Faxed office note from 03/10/20 to [facility] @ [phone] on 06/16/20

## 2020-06-18 ENCOUNTER — Ambulatory Visit (INDEPENDENT_AMBULATORY_CARE_PROVIDER_SITE_OTHER): Payer: Medicare HMO

## 2020-06-18 ENCOUNTER — Encounter (INDEPENDENT_AMBULATORY_CARE_PROVIDER_SITE_OTHER): Payer: Self-pay

## 2020-06-18 VITALS — BP 125/79 | HR 88 | Ht 63.0 in | Wt 139.0 lb

## 2020-06-18 DIAGNOSIS — Z0000 Encounter for general adult medical examination without abnormal findings: Secondary | ICD-10-CM

## 2020-06-18 NOTE — Progress Notes (Signed)
Subjective:   Jodi Reeves is a 87 y.o. female who presents for Medicare Annual (Subsequent) preventive examination.  Review of Systems    No ROS.  Medicare Wellness Virtual Visit.    Cardiac Risk Factors include: advanced age (>57men, >82 women);hypertension;diabetes mellitus     Objective:    Today's Vitals   06/18/20 1408  BP: 125/79  Pulse: 88  Weight: 139 lb (63 kg)  Height: 5\' 3"  (1.6 m)   Body mass index is 24.62 kg/m.  Advanced Directives 06/18/2020 05/18/2020 05/14/2020 05/03/2020 04/09/2020 04/22/2019 03/06/2018  Does Patient Have a Medical Advance Directive? Yes Yes Yes Yes Yes Yes Yes  Type of Paramedic of [place];Living will [place];Living will [place];Living will [place];Living will [place];Living will Healthcare Power of [place];Living will  Does patient want to make changes to medical advance directive? No - Patient declined - No - Patient declined - No - Patient declined No - Patient declined No - Patient declined  Copy of [place] in Chart? No - copy requested No - copy requested No - copy requested No - copy requested No - copy requested No - copy requested No - copy requested    Current Medications (verified) Outpatient Encounter Medications as of 06/18/2020  Medication Sig  . acetaminophen (TYLENOL) 500 MG tablet Take 2 tablets (1,000 mg total) by mouth every 6 (six) hours as needed for moderate pain.  . Ascorbic Acid (VITAMIN C) POWD Take 5 mLs by mouth daily.  . bisoprolol (ZEBETA) 5 MG tablet Take 1 tablet (5 mg total) by mouth 2 (two) times daily.  . Coenzyme Q10 50 MG TABS Take 50 mg by mouth 2 (two) times daily.  Marland Kitchen docusate sodium (COLACE) 100 MG capsule Take 100 mg by mouth daily as needed for mild constipation.  Marland Kitchen estradiol (ESTRACE) 0.1 MG/GM vaginal cream APPLY 0.5MG  (PEA SIZED AMOUNT)  JUST INSIDE THE VAGINA WITH FINGER-TIP ON MONDAY, WEDNESDAY AND FRIDAY NIGHTS.  . furosemide (LASIX) 20 MG tablet Take 1 tablet (20 mg total) by mouth 2 (two) times daily.  Marland Kitchen glucose blood (ONE TOUCH ULTRA TEST) test strip Use to test blood sugar once daily E11.49 (Patient taking differently: Use to test blood sugar twice daily E11.49)  . levothyroxine (SYNTHROID, LEVOTHROID) 50 MCG tablet Take 50 mcg by mouth daily before breakfast.  . losartan (COZAAR) 25 MG tablet Take 0.5 tablets (12.5 mg total) by mouth daily. Take if BP >110 sbp  . Magnesium Oxide 250 MG TABS Take 250 mg by mouth daily.  . metFORMIN (GLUCOPHAGE-XR) 500 MG 24 hr tablet Take 500 mg by mouth 2 (two) times daily.   . Multiple Vitamins-Minerals (MULTIVITAMIN GUMMIES ADULT PO) Take 1 each by mouth daily.  . Olopatadine HCl 0.2 % SOLN Place 1 drop into both eyes daily as needed (Allergies).  . Omega-3 1000 MG CAPS Take 1,000 mg by mouth 2 (two) times daily.  . potassium chloride (KLOR-CON) 10 MEQ tablet Take 1 tablet (10 mEq total) by mouth 2 (two) times daily.  . Probiotic Product (ALIGN) 4 MG CAPS Take 4 mg by mouth daily.  . psyllium (METAMUCIL) 58.6 % packet Take 1 packet by mouth daily as needed (constipation).  . TRADJENTA 5 MG TABS tablet Take 5 mg by mouth daily.  . traMADol (ULTRAM) 50 MG tablet Take 1 tablet (50 mg total) by mouth every 6 (six) hours as needed.  Marland Kitchen  vitamin B-12 (CYANOCOBALAMIN) 1000 MCG tablet Take 1,000 mcg by mouth daily.  Alveda Reasons 20 MG TABS tablet Take 1 tablet (20 mg total) by mouth daily with supper.   No facility-administered encounter medications on file as of 06/18/2020.    Allergies (verified) Amiodarone, Penicillins, Actos [pioglitazone], Amaryl [glimepiride], Atorvastatin, Bentyl [dicyclomine hcl], Ciprofloxacin, Codeine, Ezetimibe-simvastatin, Fosamax [alendronate], Lovastatin, Macrobid [nitrofurantoin macrocrystal], Protonix [pantoprazole sodium], Rosiglitazone maleate, Statins, Victoza  [liraglutide], Alendronate sodium, Bactrim [sulfamethoxazole-trimethoprim], and Sulfa antibiotics   History: Past Medical History:  Diagnosis Date  . Allergy   . Anxiety   . Balance disorder   . Chronic diastolic CHF (congestive heart failure) ([place])   . Colon polyps   . Coronary artery disease, non-occlusive    a. LHC 11/2003: 10% LAD stenosis, 20% LCx stenosis, and 40% RCA stenosis; b. nuc stress test 12/15: small region of mild ischemia in the apical region with WMA also noted in the apical region, EF 60%. She declined invasive evaluation at that time  . DM2 (diabetes mellitus, type 2) ([place])   . Fall    04/2017 see careeverywhere [facility] multiple fractures, brusing   . Falls frequently    h/o left shoudler/wrist fracture and left fingers numb  . GERD (gastroesophageal reflux disease)    esophageal web  . 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 History of chicken pox   . History of eating disorder   . HLD (hyperlipidemia)   . HTN (hypertension)   . Hypothyroidism   . IBS (irritable bowel syndrome)    diarrhea   . Incontinence of bowel   . Osteoporosis   . Ovarian cancer ([place])    1980  . Persistent atrial fibrillation ([place])    a. CHADS2VASc = > 8 (CHF, HTN, age x 2, DM, TIA x 2, female); b. on Xarelto  . SBO (small bowel obstruction) ([place])    1998  . TIA (transient ischemic attack)   . UTI (urinary tract infection)   . Venous insufficiency    Past Surgical History:  Procedure Laterality Date  . 2nd look laparotomy  1982  . ABDOMINAL HYSTERECTOMY     for ovarian cancer s/p hysterectomy total 1976 and exp lap 1980  . APPENDECTOMY    . CARDIAC CATHETERIZATION  8/05   neg; a fib found   . CARPAL TUNNEL RELEASE Left 03/06/2018   Procedure: CARPAL TUNNEL RELEASE ENDOSCOPIC;  Surgeon: Corky Mull, MD;  Location: [facility];  Service: Orthopedics;  Laterality: Left;  diabetic - oral meds  . CARPAL TUNNEL RELEASE     left CTS Dr.Poggi  . cataract OD  2002   . cataract surgery  5/07   R  . CHOLECYSTECTOMY  1986  . DEXA  9/04 and 1/02  . EGD/dilation/colon  1/06  . ESOPHAGOGASTRODUODENOSCOPY (EGD) WITH PROPOFOL N/A 05/16/2016   Procedure: ESOPHAGOGASTRODUODENOSCOPY (EGD) WITH PROPOFOL with dilation;  Surgeon: Lucilla Lame, MD;  Location: [facility];  Service: Endoscopy;  Laterality: N/A;  . FEMORAL HERNIA REPAIR  1958   R  . fracture L elbow/wrist  2000  . intussception/obstruction  12/98  . JOINT REPLACEMENT     left shoulder  . kidney stone x2  1991  . KYPHOPLASTY N/A 04/13/2020   Procedure: [id number];  Surgeon: Hessie Knows, MD;  Location: [facility];  Service: Orthopedics;  Laterality: N/A;  . KYPHOPLASTY N/A 05/18/2020   Procedure: L1 KYPHOPLASTY;  Surgeon: Hessie Knows, MD;  Location: [facility];  Service: Orthopedics;  Laterality: N/A;  .  laminectomy L4-5  1971  . LUMBAR LAMINECTOMY     1970 ruptured disc   . MOUTH SURGERY    . myoview stress  8/05   syncope (-)  . REVERSE SHOULDER ARTHROPLASTY Left 10/06/2014   Procedure: REVERSE SHOULDER ARTHROPLASTY;  Surgeon: Corky Mull, MD;  Location: [facility];  Service: Orthopedics;  Laterality: Left;  . stillbirth  1969  . TOTAL ABDOMINAL HYSTERECTOMY W/ BILATERAL SALPINGOOPHORECTOMY  1980   ovarian cancer  . WRIST FRACTURE SURGERY  11/05   R   Family History  Problem Relation Age of Onset  . Early death Father   . Diabetes Mother   . Heart disease Mother   . Arthritis Brother   . Cancer Brother   . Depression Brother   . Hearing loss Brother   . Arthritis Brother   . Cancer Brother        colon  . Hearing loss Brother   . Cancer Brother   . Diabetes Brother   . Hearing loss Brother   . Heart disease Brother   . Hyperlipidemia Brother   . Heart disease Sister   . Hypertension Sister   . Stroke Sister   . Hearing loss Daughter   . Hypertension Daughter   . Diabetes Paternal Grandfather   . Hearing loss Sister   . Heart disease Sister   . Arthritis Sister   .  Depression Sister   . Hearing loss Sister   . Heart disease Sister   . COPD Brother   . Early death Brother   . Early death Brother    Social History   Socioeconomic History  . Marital status: Widowed    Spouse name: Not on file  . Number of children: 1  . Years of education: Not on file  . Highest education level: Not on file  Occupational History  . 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 Smoking status: Never Smoker  . Smokeless tobacco: Never Used  Vaping Use  . Vaping Use: Never used  Substance and Sexual Activity  . Alcohol use: No    Comment: may have occasional glass of wine  . Drug use: No  . Sexual activity: Not Currently  Other Topics Concern  . 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 Difficulty of Paying Living Expenses: Somewhat hard  Food Insecurity: No Food Insecurity  . Worried About Charity fundraiser in the Last Year: Never true  . Ran Out of Food in the Last Year: Never true  Transportation Needs: Unmet Transportation Needs  . Lack of Transportation (Medical): Yes  . Lack of Transportation (Non-Medical): Yes  Physical Activity: Insufficiently Active  . Days of Exercise per Week: 3 days  . Minutes of Exercise per Session: 20 min  Stress: No Stress Concern Present  . Feeling of Stress : Not at all  Social Connections: Unknown  . Frequency of Communication with Friends and Family: Not on file  . Frequency of Social Gatherings with Friends and  Family: More than three times a week  . Attends Religious Services: Not on file  . Active Member of  Clubs or Organizations: Not on file  . Attends Archivist Meetings: Not on file  . Marital Status: Not on file    Tobacco Counseling Counseling given: Not Answered   Clinical Intake:  Pre-visit preparation completed: Yes       Diabetes- followed by Endocrinology, Dr. Honor Junes Diabetes: Yes (Followed by Dr. Johnny Bridge blood sugar 173  How often do you need to have someone help you when you read instructions, pamphlets, or other written materials from your doctor or pharmacy?: 1 - Never   Interpreter Needed?: No      Activities of Daily Living In your present state of health, do you have any difficulty performing the following activities: 06/18/2020 05/14/2020  Hearing? N -  Vision? N -  Difficulty concentrating or making decisions? N -  Comment Age appropriate -  Walking or climbing stairs? Y Y  Comment Unsteady gait. Assistance in use when ambulating. -  Dressing or bathing? Y -  Comment Aide assist -  Doing errands, shopping? Tempie Donning  Preparing Food and eating ? Y -  Comment Aide assist -  Using the Toilet? Y -  Comment Aide assist as needed -  In the past six months, have you accidently leaked urine? Y -  Comment Wears daily brief -  Do you have problems with loss of bowel control? Y -  Comment Wears daily brief -  Managing your Medications? Y -  Comment Aide assist -  Managing your Finances? Y -  Comment Son assist -  Housekeeping or managing your Housekeeping? Y -  Comment Aide assist -  Some recent data might be hidden    Patient Care Team: [name], Nino Glow, MD as PCP - General (Internal Medicine) Minna Merritts, MD as Consulting Physician (Cardiology) De Hollingshead, RPH-CPP (Pharmacist)  Indicate any recent Medical Services you may have received from other than [facility] providers in the past year (date may be approximate).     Assessment:   This is a routine wellness examination for [place].  I connected with [place] today  by telephone and verified that I am speaking with the correct person using two identifiers. Location patient: home Location provider: work Persons participating in the virtual visit: patient, Marine scientist.    I discussed the limitations, risks, security and privacy concerns of performing an evaluation and management service by telephone and the availability of in person appointments. The patient expressed understanding and verbally consented to this telephonic visit.    Interactive audio and video telecommunications were attempted between this provider and patient, however failed, due to patient having technical difficulties OR patient did not have access to video capability.  We continued and completed visit with audio only.  Some vital signs may be absent or patient reported.   Hearing/Vision screen  Hearing Screening   125Hz  250Hz  500Hz  1000Hz  2000Hz  3000Hz  4000Hz  6000Hz  8000Hz   Right ear:           Left ear:           Comments: Patient is able to hear conversational tones without difficulty. No issues reported.  Vision Screening Comments: Wears corrective lenses  Visual acuity not assessed, virtual visit.   Dietary issues and exercise activities discussed: Current Exercise Habits: Home exercise routine, Type of exercise: walking, Intensity: Mild  Healthy diet; low salt, low carb Good water intake  Goals  Patient Stated   .  Medication Monitoring (pt-stated)      Patient Goals/Self-Care Activities . Over the next 90 days, patient will:  - take medications as prescribed check blood glucose daily, document, and provide at future appointments check blood pressure daily, document, and provide at future appointments collaborate with provider on medication access solutions      Other   .  Follow up with Primary Care Provider      As needed      Depression Screen PHQ 2/9 Scores 06/18/2020 04/22/2019 12/26/2018 10/05/2017 02/17/2013  PHQ - 2 Score 0 0 0 0 0    Fall Risk Fall Risk   06/18/2020 03/10/2020 08/21/2019 04/22/2019 12/26/2018  Falls in the past year? 1 0 0 0 0  Number falls in past yr: 0 0 0 - -  Injury with Fall? 0 0 0 - -  Risk Factor Category  - - - - -  Risk for fall due to : Impaired balance/gait - Impaired balance/gait;Impaired mobility - -  Follow up Falls evaluation completed Falls evaluation completed Falls evaluation completed Falls prevention discussed -    FALL RISK PREVENTION PERTAINING TO THE HOME: Handrails in use when climbing stairs? Yes Home free of loose throw rugs in walkways, pet beds, electrical cords, etc? Yes  Adequate lighting in your home to reduce risk of falls? Yes   ASSISTIVE DEVICES UTILIZED TO PREVENT FALLS: Life alert? Yes  Use of a cane, walker or w/c? Yes cane/walker/wheelchair Grab bars in the bathroom? Yes  Shower chair or bench in shower? Yes  Elevated toilet seat or a handicapped toilet? Yes   Lift chair? Yes  TIMED UP AND GO: Was the test performed? No . Virtual visit.    Cognitive Function:  Patient is alert and oriented x3.  Denies difficulty focusing, making decisions, memory loss.  Enjoys word puzzles.   6CIT Screen 04/22/2019  What Year? 0 points  What month? 0 points  What time? 0 points  Count back from 20 0 points  Months in reverse 0 points  Repeat phrase 0 points  Total Score 0    Immunizations Immunization History  Administered Date(s) Administered  . Fluad Quad(high Dose 65+) 01/23/2019  . Influenza Split 01/24/2012  . Influenza Whole 04/25/2003, 01/16/2008  . Influenza, High Dose Seasonal PF 03/04/2020  . Influenza,inj,Quad PF,6+ Mos 03/29/2017  . Influenza,inj,quad, With Preservative 02/18/2018  . Influenza-Unspecified 01/30/2014, 01/23/2016, 02/07/2017, 03/29/2017, 02/13/2018  . PFIZER(Purple Top)SARS-COV-2 Vaccination 05/13/2019, 06/07/2019, 02/02/2020  . PPD Test 05/22/2017  . Pneumococcal Conjugate-13 03/03/2016  . Pneumococcal Polysaccharide-23 04/25/2003  . Td 03/30/2004,  04/24/2004  . Tdap 02/20/2008, 04/10/2018  . Tetanus 03/30/2004  . Zoster 02/05/2008    Health Maintenance Health Maintenance  Topic Date Due  . FOOT EXAM  10/07/2019  . HEMOGLOBIN A1C  03/27/2020  . COVID-19 Vaccine (4 - Booster for Pfizer series) 08/02/2020  . OPHTHALMOLOGY EXAM  03/09/2021  . TETANUS/TDAP  04/10/2028  . INFLUENZA VACCINE  Completed  . DEXA SCAN  Completed  . PNA vac Low Risk Adult  Completed   Colorectal cancer screening: No longer required.   Mammogram- No longer required.   Bone density- 06/17/20. Osteopenia. Reclast scheduled 07/08/20.   Transportation to medical appointments- community service declined at this time. Plans to continue to allow son to arrange transportation. Encouraged to notify the office if this arrangement changes.   Lung Cancer Screening: (Low Dose CT Chest recommended if Age 50-80 years, 30 pack-year currently smoking OR have  quit w/in 15years.) does not qualify.   Hepatitis C Screening: does not qualify.  Vision Screening: Recommended annual ophthalmology exams for early detection of glaucoma and other disorders of the eye. Is the patient up to date with their annual eye exam?  Yes   Dental Screening: Recommended annual dental exams for proper oral hygiene.  Community Resource Referral / Chronic Care Management: CRR required this visit?  No   CCM required this visit?  No      Plan:   Keep all routine maintenance appointments.   I have personally reviewed and noted the following in the patient's chart:   . Medical and social history . Use of alcohol, tobacco or illicit drugs  . Current medications and supplements . Functional ability and status . Nutritional status . Physical activity . Advanced directives . List of other physicians . Hospitalizations, surgeries, and ER visits in previous 12 months . Vitals . Screenings to include cognitive, depression, and falls . Referrals and appointments  In addition, I have  reviewed and discussed with patient certain preventive protocols, quality metrics, and best practice recommendations. A written personalized care plan for preventive services as well as general preventive health recommendations were provided to patient.     Varney Biles, LPN   7/67/0110

## 2020-06-18 NOTE — Patient Instructions (Addendum)
Jodi Reeves , Thank you for taking time to come for your Medicare Wellness Visit. I appreciate your ongoing commitment to your health goals. Please review the following plan we discussed and let me know if I can assist you in the future.   These are the goals we discussed: Goals      Patient Stated   .  Medication Monitoring (pt-stated)      Patient Goals/Self-Care Activities . Over the next 90 days, patient will:  - take medications as prescribed check blood glucose daily, document, and provide at future appointments check blood pressure daily, document, and provide at future appointments collaborate with provider on medication access solutions      Other   .  Follow up with Primary Care Provider      As needed       This is a list of the screening recommended for you and due dates:  Health Maintenance  Topic Date Due  . Complete foot exam   10/07/2019  . Hemoglobin A1C  03/27/2020  . COVID-19 Vaccine (4 - Booster for Pfizer series) 08/02/2020  . Eye exam for diabetics  03/09/2021  . Tetanus Vaccine  04/10/2028  . Flu Shot  Completed  . DEXA scan (bone density measurement)  Completed  . Pneumonia vaccines  Completed   Advanced directives: End of life planning; Advance aging; Advanced directives discussed.  Copy of current HCPOA/Living Will requested.    Conditions/risks identified: none new.   Follow up in one year for your annual wellness visit.   Preventive Care 60 Years and Older, Female Preventive care refers to lifestyle choices and visits with your health care provider that can promote health and wellness. What does preventive care include?  A yearly physical exam. This is also called an annual well check.  Dental exams once or twice a year.  Routine eye exams. Ask your health care provider how often you should have your eyes checked.  Personal lifestyle choices, including:  Daily care of your teeth and gums.  Regular physical activity.  Eating a  healthy diet.  Avoiding tobacco and drug use.  Limiting alcohol use.  Practicing safe sex.  Taking low-dose aspirin every day.  Taking vitamin and mineral supplements as recommended by your health care provider. What happens during an annual well check? The services and screenings done by your health care provider during your annual well check will depend on your age, overall health, lifestyle risk factors, and family history of disease. Counseling  Your health care provider may ask you questions about your:  Alcohol use.  Tobacco use.  Drug use.  Emotional well-being.  Home and relationship well-being.  Sexual activity.  Eating habits.  History of falls.  Memory and ability to understand (cognition).  Work and work Statistician.  Reproductive health. Screening  You may have the following tests or measurements:  Height, weight, and BMI.  Blood pressure.  Lipid and cholesterol levels. These may be checked every 5 years, or more frequently if you are over 73 years old.  Skin check.  Lung cancer screening. You may have this screening every year starting at age 61 if you have a 30-pack-year history of smoking and currently smoke or have quit within the past 15 years.  Fecal occult blood test (FOBT) of the stool. You may have this test every year starting at age 13.  Flexible sigmoidoscopy or colonoscopy. You may have a sigmoidoscopy every 5 years or a colonoscopy every 10 years starting at age  50.  Hepatitis C blood test.  Hepatitis B blood test.  Sexually transmitted disease (STD) testing.  Diabetes screening. This is done by checking your blood sugar (glucose) after you have not eaten for a while (fasting). You may have this done every 1-3 years.  Bone density scan. This is done to screen for osteoporosis. You may have this done starting at age 37.  Mammogram. This may be done every 1-2 years. Talk to your health care provider about how often you should  have regular mammograms. Talk with your health care provider about your test results, treatment options, and if necessary, the need for more tests. Vaccines  Your health care provider may recommend certain vaccines, such as:  Influenza vaccine. This is recommended every year.  Tetanus, diphtheria, and acellular pertussis (Tdap, Td) vaccine. You may need a Td booster every 10 years.  Zoster vaccine. You may need this after age 31.  Pneumococcal 13-valent conjugate (PCV13) vaccine. One dose is recommended after age 21.  Pneumococcal polysaccharide (PPSV23) vaccine. One dose is recommended after age 35. Talk to your health care provider about which screenings and vaccines you need and how often you need them. This information is not intended to replace advice given to you by your health care provider. Make sure you discuss any questions you have with your health care provider. Document Released: 05/07/2015 Document Revised: 12/29/2015 Document Reviewed: 02/09/2015 Elsevier Interactive Patient Education  2017 [employer] [employer] Prevention in the Home Falls can cause injuries. They can happen to people of all ages. There are many things you can do to make your home safe and to help prevent falls. What can I do on the outside of my home?  Regularly fix the edges of walkways and driveways and fix any cracks.  Remove anything that might make you trip as you walk through a door, such as a raised step or threshold.  Trim any bushes or trees on the path to your home.  Use bright outdoor lighting.  Clear any walking paths of anything that might make someone trip, such as rocks or tools.  Regularly check to see if handrails are loose or broken. Make sure that both sides of any steps have handrails.  Any raised decks and porches should have guardrails on the edges.  Have any leaves, snow, or ice cleared regularly.  Use sand or salt on walking paths during winter.  Clean up any spills in  your garage right away. This includes oil or grease spills. What can I do in the bathroom?  Use night lights.  Install grab bars by the toilet and in the tub and shower. Do not use towel bars as grab bars.  Use non-skid mats or decals in the tub or shower.  If you need to sit down in the shower, use a plastic, non-slip stool.  Keep the floor dry. Clean up any water that spills on the floor as soon as it happens.  Remove soap buildup in the tub or shower regularly.  Attach bath mats securely with double-sided non-slip rug tape.  Do not have throw rugs and other things on the floor that can make you trip. What can I do in the bedroom?  Use night lights.  Make sure that you have a light by your bed that is easy to reach.  Do not use any sheets or blankets that are too big for your bed. They should not hang down onto the floor.  Have a firm chair that  has side arms. You can use this for support while you get dressed.  Do not have throw rugs and other things on the floor that can make you trip. What can I do in the kitchen?  Clean up any spills right away.  Avoid walking on wet floors.  Keep items that you use a lot in easy-to-reach places.  If you need to reach something above you, use a strong step stool that has a grab bar.  Keep electrical cords out of the way.  Do not use floor polish or wax that makes floors slippery. If you must use wax, use non-skid floor wax.  Do not have throw rugs and other things on the floor that can make you trip. What can I do with my stairs?  Do not leave any items on the stairs.  Make sure that there are handrails on both sides of the stairs and use them. Fix handrails that are broken or loose. Make sure that handrails are as long as the stairways.  Check any carpeting to make sure that it is firmly attached to the stairs. Fix any carpet that is loose or worn.  Avoid having throw rugs at the top or bottom of the stairs. If you do have  throw rugs, attach them to the floor with carpet tape.  Make sure that you have a light switch at the top of the stairs and the bottom of the stairs. If you do not have them, ask someone to add them for you. What else can I do to help prevent falls?  Wear shoes that:  Do not have high heels.  Have rubber bottoms.  Are comfortable and fit you well.  Are closed at the toe. Do not wear sandals.  If you use a stepladder:  Make sure that it is fully opened. Do not climb a closed stepladder.  Make sure that both sides of the stepladder are locked into place.  Ask someone to hold it for you, if possible.  Clearly mark and make sure that you can see:  Any grab bars or handrails.  First and last steps.  Where the edge of each step is.  Use tools that help you move around (mobility aids) if they are needed. These include:  Canes.  Walkers.  Scooters.  Crutches.  Turn on the lights when you go into a dark area. Replace any light bulbs as soon as they burn out.  Set up your furniture so you have a clear path. Avoid moving your furniture around.  If any of your floors are uneven, fix them.  If there are any pets around you, be aware of where they are.  Review your medicines with your doctor. Some medicines can make you feel dizzy. This can increase your chance of falling. Ask your doctor what other things that you can do to help prevent falls. This information is not intended to replace advice given to you by your health care provider. Make sure you discuss any questions you have with your health care provider. Document Released: 02/04/2009 Document Revised: 09/16/2015 Document Reviewed: 05/15/2014 Elsevier Interactive Patient Education  2017 [employer].

## 2020-06-23 NOTE — Telephone Encounter (Signed)
Faxed signed order [id number] to [facility] [phone] faxed on 06-22-20

## 2020-06-25 ENCOUNTER — Telehealth: Payer: Self-pay

## 2020-06-25 ENCOUNTER — Other Ambulatory Visit: Payer: Self-pay | Admitting: Orthopedic Surgery

## 2020-06-25 ENCOUNTER — Other Ambulatory Visit (HOSPITAL_COMMUNITY): Payer: Self-pay | Admitting: Orthopedic Surgery

## 2020-06-25 DIAGNOSIS — S32010A Wedge compression fracture of first lumbar vertebra, initial encounter for closed fracture: Secondary | ICD-10-CM

## 2020-06-25 NOTE — Telephone Encounter (Signed)
Faxed signed physician order number [id number] to [facility] @ [phone] ST effective 06/27/20 1wk1

## 2020-06-28 ENCOUNTER — Other Ambulatory Visit: Payer: Self-pay | Admitting: Cardiovascular Disease

## 2020-06-28 NOTE — Progress Notes (Signed)
06/25/2018 12:21 PM   Jodi Reeves Sep 10, 1927 [id number]  Referring provider: [name], Nino Glow, MD [place],  [place] [address]  Chief Complaint  Patient presents with  . Over Active Bladder    HPI: Patient is a 87 year old female with a history of hematuria, rUTI's and frequency who presents today for one year follow up with caregiver, Patty.   History of hematuria (high risk) Non-smoker.  CT urogram 12/2017 both adrenal glands appear normal. No urinary tract calculi are identified. No renal parenchymal mass observed. Borderline urinary bladder wall thickening is probably from nondistention. Right greater than left bladder diverticula noted. No abnormal filling defect identified along the urothelium.  Cysto with [name] 01/2018 NED.   No reports of gross hematuria.   rUTI's No recent reports of UTI's.  She is using vaginal estrogen cream three night weekly.   Patient denies any modifying or aggravating factors.  Patient denies any gross hematuria, dysuria or suprapubic/flank pain.  Patient denies any fevers, chills, nausea or vomiting.   Frequency She continues to have urinary frequency secondary to her Lasix and lymphedema.  She reports burning in the vaginal area but due to her intense back pain on unable to perform a pelvic exam at today's visit.  She is scheduled for an MRI of the spine later this afternoon.   PMH: Past Medical History:  Diagnosis Date  . Allergy   . Anxiety   . Balance disorder   . Chronic diastolic CHF (congestive heart failure) ([place])   . Colon polyps   . Coronary artery disease, non-occlusive    a. LHC 11/2003: 10% LAD stenosis, 20% LCx stenosis, and 40% RCA stenosis; b. nuc stress test 12/15: small region of mild ischemia in the apical region with WMA also noted in the apical region, EF 60%. She declined invasive evaluation at that time  . DM2 (diabetes mellitus, type 2) ([place])   . Fall    04/2017 see careeverywhere [facility]  multiple fractures, brusing   . Falls frequently    h/o left shoudler/wrist fracture and left fingers numb  . GERD (gastroesophageal reflux disease)    esophageal web  . 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 History of chicken pox   . History of eating disorder   . HLD (hyperlipidemia)   . HTN (hypertension)   . Hypothyroidism   . IBS (irritable bowel syndrome)    diarrhea   . Incontinence of bowel   . Osteoporosis   . Ovarian cancer ([place])    1980  . Persistent atrial fibrillation ([place])    a. CHADS2VASc = > 8 (CHF, HTN, age x 2, DM, TIA x 2, female); b. on Xarelto  . SBO (small bowel obstruction) ([place])    1998  . TIA (transient ischemic attack)   . UTI (urinary tract infection)   . Venous insufficiency     Surgical History: Past Surgical History:  Procedure Laterality Date  . 2nd look laparotomy  1982  . ABDOMINAL HYSTERECTOMY     for ovarian cancer s/p hysterectomy total 1976 and exp lap 1980  . APPENDECTOMY    . CARDIAC CATHETERIZATION  8/05   neg; a fib found   . CARPAL TUNNEL RELEASE Left 03/06/2018   Procedure: CARPAL TUNNEL RELEASE ENDOSCOPIC;  Surgeon: Corky Mull, MD;  Location: [facility];  Service: Orthopedics;  Laterality: Left;  diabetic - oral meds  . CARPAL TUNNEL RELEASE  left CTS Dr.Poggi  . cataract OD  2002  . cataract surgery  5/07   R  . CHOLECYSTECTOMY  1986  . DEXA  9/04 and 1/02  . EGD/dilation/colon  1/06  . ESOPHAGOGASTRODUODENOSCOPY (EGD) WITH PROPOFOL N/A 05/16/2016   Procedure: ESOPHAGOGASTRODUODENOSCOPY (EGD) WITH PROPOFOL with dilation;  Surgeon: Lucilla Lame, MD;  Location: [facility];  Service: Endoscopy;  Laterality: N/A;  . FEMORAL HERNIA REPAIR  1958   R  . fracture L elbow/wrist  2000  . intussception/obstruction  12/98  . JOINT REPLACEMENT     left shoulder  . kidney stone x2  1991  . KYPHOPLASTY N/A 04/13/2020   Procedure: [id number];  Surgeon: Hessie Knows, MD;  Location: [facility];   Service: Orthopedics;  Laterality: N/A;  . KYPHOPLASTY N/A 05/18/2020   Procedure: L1 KYPHOPLASTY;  Surgeon: Hessie Knows, MD;  Location: [facility];  Service: Orthopedics;  Laterality: N/A;  . laminectomy L4-5  1971  . LUMBAR LAMINECTOMY     1970 ruptured disc   . MOUTH SURGERY    . myoview stress  8/05   syncope (-)  . REVERSE SHOULDER ARTHROPLASTY Left 10/06/2014   Procedure: REVERSE SHOULDER ARTHROPLASTY;  Surgeon: Corky Mull, MD;  Location: [facility];  Service: Orthopedics;  Laterality: Left;  . stillbirth  1969  . TOTAL ABDOMINAL HYSTERECTOMY W/ BILATERAL SALPINGOOPHORECTOMY  1980   ovarian cancer  . WRIST FRACTURE SURGERY  11/05   R    Home Medications:  Allergies as of 06/29/2020      Reactions   Amiodarone Other (See Comments)   Severe Thyroid issues    Penicillins Anaphylaxis, Other (See Comments)   Has patient had a PCN reaction causing immediate rash, facial/tongue/throat swelling, SOB or lightheadedness with hypotension: Yes Has patient had a PCN reaction causing severe rash involving mucus membranes or skin necrosis: No Has patient had a PCN reaction that required hospitalization: No Has patient had a PCN reaction occurring within the last 10 years: No If all of the above answers are "NO", then may proceed with Cephalosporin use.   Actos [pioglitazone]    Not effective    Amaryl [glimepiride]    Not effective    Atorvastatin Other (See Comments)   Muscle aches & All statins per Patient   Bentyl [dicyclomine Hcl]    Could not tolerate nausea, reduced concentration, h/a   Ciprofloxacin Other (See Comments)   High blood pressure    Codeine Nausea And Vomiting   Ezetimibe-simvastatin Other (See Comments)   Muscle aches, nausea, back pain    Fosamax [alendronate]    dysphagia   Lovastatin Other (See Comments)   Myalgias   Macrobid [nitrofurantoin Macrocrystal]    Severe Itching, rash   Protonix [pantoprazole Sodium]    Esophageal problems    Rosiglitazone  Maleate Other (See Comments)   Edema   Statins    Muscle and joint aches to all statins   Victoza [liraglutide]    Nausea   Alendronate Sodium Rash   Dysphagia and ulceration    Bactrim [sulfamethoxazole-trimethoprim] Rash   itching   Sulfa Antibiotics Rash   Itching      Medication List       Accurate as of June 29, 2020 11:59 PM. If you have any questions, ask your nurse or doctor.        STOP taking these medications   bisoprolol 5 MG tablet Commonly known as: ZEBETA Stopped by:  , PA-C   docusate sodium 100 MG capsule Commonly  known as: COLACE Stopped by: Zara Council, PA-C   potassium chloride 10 MEQ tablet Commonly known as: KLOR-CON Stopped by:  , PA-C   Tradjenta 5 MG Tabs tablet Generic drug: linagliptin Stopped by: Zara Council, PA-C     TAKE these medications   acetaminophen 500 MG tablet Commonly known as: TYLENOL Take 2 tablets (1,000 mg total) by mouth every 6 (six) hours as needed for moderate pain.   Align 4 MG Caps Take 4 mg by mouth daily.   bisoprolol-hydrochlorothiazide 2.5-6.25 MG tablet Commonly known as: ZIAC Take 1 tablet by mouth daily.   Coenzyme Q10 50 MG Tabs Take 50 mg by mouth 2 (two) times daily.   dimethicone-zinc oxide cream Apply topically 2 (two) times daily as needed for dry skin. Please label the bottle for the vaginal area only. Started by: Zara Council, PA-C   estradiol 0.1 MG/GM vaginal cream Commonly known as: ESTRACE APPLY 0.5MG  (PEA SIZED AMOUNT) JUST INSIDE THE VAGINA WITH FINGER-TIP ON Reeves, WEDNESDAY AND FRIDAY NIGHTS.   furosemide 20 MG tablet Commonly known as: LASIX Take 1 tablet (20 mg total) by mouth 2 (two) times daily.   glucose blood test strip Commonly known as: ONE TOUCH ULTRA TEST Use to test blood sugar once daily E11.49 What changed: additional instructions   levothyroxine 50 MCG tablet Commonly known as: SYNTHROID Take 50 mcg by mouth daily before  breakfast.   losartan 25 MG tablet Commonly known as: COZAAR Take 0.5 tablets (12.5 mg total) by mouth daily. Take if BP >110 sbp   Magnesium Oxide 250 MG Tabs Take 250 mg by mouth daily.   metFORMIN 500 MG 24 hr tablet Commonly known as: GLUCOPHAGE-XR Take 500 mg by mouth 2 (two) times daily.   MULTIVITAMIN GUMMIES ADULT PO Take 1 each by mouth daily.   Olopatadine HCl 0.2 % Soln Place 1 drop into both eyes daily as needed (Allergies).   Omega-3 1000 MG Caps Take 1,000 mg by mouth 2 (two) times daily.   potassium chloride 10 MEQ tablet Commonly known as: KLOR-CON Take 10 mEq by mouth 2 (two) times daily.   psyllium 58.6 % packet Commonly known as: METAMUCIL Take 1 packet by mouth daily as needed (constipation).   traMADol 50 MG tablet Commonly known as: Ultram Take 1 tablet (50 mg total) by mouth every 6 (six) hours as needed.   vitamin B-12 1000 MCG tablet Commonly known as: CYANOCOBALAMIN Take 1,000 mcg by mouth daily.   Vitamin C Powd Take 5 mLs by mouth daily.   Xarelto 20 MG Tabs tablet Generic drug: rivaroxaban Take 1 tablet (20 mg total) by mouth daily with supper.       Allergies:  Allergies  Allergen Reactions  . Amiodarone Other (See Comments)    Severe Thyroid issues   . Penicillins Anaphylaxis and Other (See Comments)    Has patient had a PCN reaction causing immediate rash, facial/tongue/throat swelling, SOB or lightheadedness with hypotension: Yes Has patient had a PCN reaction causing severe rash involving mucus membranes or skin necrosis: No Has patient had a PCN reaction that required hospitalization: No Has patient had a PCN reaction occurring within the last 10 years: No If all of the above answers are "NO", then may proceed with Cephalosporin use.   . Actos [Pioglitazone]     Not effective   . Amaryl [Glimepiride]     Not effective    . Atorvastatin Other (See Comments)    Muscle aches & All statins per  Patient  . Bentyl  [Dicyclomine Hcl]     Could not tolerate nausea, reduced concentration, h/a   . Ciprofloxacin Other (See Comments)    High blood pressure    . Codeine Nausea And Vomiting  . Ezetimibe-Simvastatin Other (See Comments)    Muscle aches, nausea, back pain   . Fosamax [Alendronate]     dysphagia  . Lovastatin Other (See Comments)    Myalgias  . Macrobid [Nitrofurantoin Macrocrystal]     Severe Itching, rash   . Protonix [Pantoprazole Sodium]     Esophageal problems    . Rosiglitazone Maleate Other (See Comments)    Edema  . Statins     Muscle and joint aches to all statins  . Victoza [Liraglutide]     Nausea   . Alendronate Sodium Rash    Dysphagia and ulceration   . Bactrim [Sulfamethoxazole-Trimethoprim] Rash    itching  . Sulfa Antibiotics Rash    Itching     Family History: Family History  Problem Relation Age of Onset  . Early death Father   . Diabetes Mother   . Heart disease Mother   . Arthritis Brother   . Cancer Brother   . Depression Brother   . Hearing loss Brother   . Arthritis Brother   . Cancer Brother        colon  . Hearing loss Brother   . Cancer Brother   . Diabetes Brother   . Hearing loss Brother   . Heart disease Brother   . Hyperlipidemia Brother   . Heart disease Sister   . Hypertension Sister   . Stroke Sister   . Hearing loss Daughter   . Hypertension Daughter   . Diabetes Paternal Grandfather   . Hearing loss Sister   . Heart disease Sister   . Arthritis Sister   . Depression Sister   . Hearing loss Sister   . Heart disease Sister   . COPD Brother   . Early death Brother   . Early death Brother     Social History:  reports that she has never smoked. She has never used smokeless tobacco. She reports that she does not drink alcohol and does not use drugs.  ROS: For pertinent review of systems please refer to history of present illness  Physical Exam: BP 119/75   Pulse 91   Ht 5\' 3"  (1.6 m)   Wt 140 lb (63.5 kg)   BMI  24.80 kg/m   Constitutional:  Well nourished. Alert and oriented, Tearful.  In wheelchair.  HEENT: [place] AT, mask in place.  Trachea midline Cardiovascular: No clubbing, cyanosis, or edema. Respiratory: Normal respiratory effort, no increased work of breathing. Neurologic: Grossly intact, no focal deficits, moving all 4 extremities. Psychiatric: Normal mood and affect.   Laboratory Data: Lab Results  Component Value Date   WBC 7.8 05/03/2020   HGB 13.0 05/03/2020   HCT 40.1 05/03/2020   MCV 101.3 (H) 05/03/2020   PLT 215 05/03/2020    Lab Results  Component Value Date   CREATININE 0.64 05/03/2020   Lab Results  Component Value Date   HGBA1C 6.9 (H) 09/26/2019    Lab Results  Component Value Date   TSH 4.306 04/15/2020       Component Value Date/Time   CHOL 171 03/10/2020 1353   CHOL 185 09/26/2019 1011   HDL 67.50 03/10/2020 1353   HDL 67 09/26/2019 1011   CHOLHDL 3 03/10/2020 1353   VLDL 15.8 03/10/2020 1353  LDLCALC 87 03/10/2020 1353   LDLCALC 107 (H) 09/26/2019 1011    Lab Results  Component Value Date   AST 33 04/08/2020   Lab Results  Component Value Date   ALT 11 04/08/2020   I have reviewed the labs.  Pertinent Imaging: Results for JULIZZA, SASSONE (MRN [id number]) as of 06/29/2020 14:20  Ref. Range 06/29/2020 13:36  Scan Result Unknown 68     Assessment & Plan:    1. Frequency/Nocturia -Continues to experience symptoms due to Lasix and lymphedema -MRI of the back is pending this afternoon and we may find that a spinal issue may be contributing to her urinary symptoms  2. rUTI's - Has been evaluated by Infectious Disease and they have recommended starting vaginal estrogen cream, urodynamics, and ensuring patient empties her bladder.  - Urodynamic study deferred due to the patients transportation issues.  - PVRs minimal in the office.  - She has continued the vaginal estrogen cream (Estrace)  3. History of hematuria - Hematuria work up  completed in 01/29/2018 - with no concerning findings on cystoscopy.  - No report of gross hematuria   4. Vaginal burning - continue vaginal estrogen cream three nights weekly -We will prescribe a zinc oxide cream to use as a skin barrier in the vaginal area.  Advised her to label this bottle for use in the vaginal area only as she is already using zinc oxide for an irritation in the buttocks.                                        Return for follow up pending lumbar MRI results .  Laneta Simmers  [facility] [address]  [place], [place] [address] [phone]

## 2020-06-29 ENCOUNTER — Ambulatory Visit
Admission: RE | Admit: 2020-06-29 | Discharge: 2020-06-29 | Disposition: A | Discharge status: Home / Self Care | Payer: Medicare HMO | Source: Ambulatory Visit | Attending: Orthopedic Surgery | Admitting: Orthopedic Surgery

## 2020-06-29 ENCOUNTER — Other Ambulatory Visit: Payer: Self-pay

## 2020-06-29 ENCOUNTER — Ambulatory Visit: Payer: Medicare HMO | Admitting: Urology

## 2020-06-29 ENCOUNTER — Encounter: Payer: Self-pay | Admitting: Urology

## 2020-06-29 VITALS — BP 119/75 | HR 91 | Ht 63.0 in | Wt 140.0 lb

## 2020-06-29 DIAGNOSIS — S32010A Wedge compression fracture of first lumbar vertebra, initial encounter for closed fracture: Secondary | ICD-10-CM

## 2020-06-29 DIAGNOSIS — N3281 Overactive bladder: Secondary | ICD-10-CM | POA: Diagnosis not present

## 2020-06-29 DIAGNOSIS — N949 Unspecified condition associated with female genital organs and menstrual cycle: Secondary | ICD-10-CM

## 2020-06-29 LAB — BLADDER SCAN AMB NON-IMAGING: Scan Result: 68

## 2020-06-29 MED ORDER — BAZA PROTECT EX CREA: TOPICAL_CREAM | Freq: Two times a day (BID) | CUTANEOUS | 0 refills | Status: DC | PRN

## 2020-07-01 ENCOUNTER — Telehealth: Payer: Self-pay

## 2020-07-01 ENCOUNTER — Other Ambulatory Visit: Payer: Self-pay | Admitting: Orthopedic Surgery

## 2020-07-01 NOTE — Telephone Encounter (Signed)
Faxed physician verbal orders to [facility] for speech therapy [phone] 07/01/20

## 2020-07-05 ENCOUNTER — Other Ambulatory Visit
Admission: RE | Admit: 2020-07-05 | Discharge: 2020-07-05 | Disposition: A | Discharge status: Home / Self Care | Payer: Medicare HMO | Source: Ambulatory Visit | Attending: Orthopedic Surgery | Admitting: Orthopedic Surgery

## 2020-07-05 ENCOUNTER — Other Ambulatory Visit: Payer: Self-pay

## 2020-07-05 DIAGNOSIS — Z01812 Encounter for preprocedural laboratory examination: Secondary | ICD-10-CM | POA: Insufficient documentation

## 2020-07-05 DIAGNOSIS — Z20822 Contact with and (suspected) exposure to covid-19: Secondary | ICD-10-CM | POA: Diagnosis not present

## 2020-07-05 HISTORY — DX: Other specified disorders of bone density and structure, unspecified site: M85.80

## 2020-07-05 HISTORY — DX: Ventral hernia without obstruction or gangrene: K43.9

## 2020-07-05 LAB — SARS CORONAVIRUS 2 (TAT 6-24 HRS): SARS Coronavirus 2: NEGATIVE

## 2020-07-05 MED ORDER — SODIUM CHLORIDE 0.9 % IV SOLN: INTRAVENOUS | Status: DC

## 2020-07-05 MED ORDER — CLINDAMYCIN PHOSPHATE 900 MG/50ML IV SOLN: 900.0000 mg | INTRAVENOUS | Status: DC

## 2020-07-05 MED ORDER — FAMOTIDINE 20 MG PO TABS: 20.0000 mg | ORAL_TABLET | Freq: Once | ORAL | Status: DC

## 2020-07-05 MED ORDER — ORAL CARE MOUTH RINSE: 15.0000 mL | Freq: Once | OROMUCOSAL | Status: AC

## 2020-07-05 MED ORDER — CHLORHEXIDINE GLUCONATE 0.12 % MT SOLN
15.0000 mL | Freq: Once | OROMUCOSAL | Status: AC
  Administered 2020-07-06: 15 mL via OROMUCOSAL

## 2020-07-05 NOTE — Patient Instructions (Signed)
Your procedure is scheduled on: 07/06/20 Report to [facility]. To find out your arrival time please call [phone] between 1PM - 3PM on 07/05/20.  Remember: Instructions that are not followed completely may result in serious medical risk, up to and including death, or upon the discretion of your surgeon and anesthesiologist your surgery may need to be rescheduled.     _X__ 1. Do not eat food after midnight the night before your procedure.                 No gum chewing or hard candies. You may drink clear liquids up to 2 hours                 before you are scheduled to arrive for your surgery- DO not drink clear                 liquids within 2 hours of the start of your surgery.                 Clear Liquids include:  water, apple juice without pulp, clear carbohydrate                 drink such as Clearfast or Gatorade, Black Coffee or Tea (Do not add                 anything to coffee or tea). Diabetics water only  __X__2.  On the morning of surgery brush your teeth with toothpaste and water, you                 may rinse your mouth with mouthwash if you wish.  Do not swallow any              toothpaste of mouthwash.     _X__ 3.  No Alcohol for 24 hours before or after surgery.   _X__ 4.  Do Not Smoke or use e-cigarettes For 24 Hours Prior to Your Surgery.                 Do not use any chewable tobacco products for at least 6 hours prior to                 surgery.  ____  5.  Bring all medications with you on the day of surgery if instructed.   __X__  6.  Notify your doctor if there is any change in your medical condition      (cold, fever, infections).     Do not wear jewelry, make-up, hairpins, clips or nail polish. Do not wear lotions, powders, or perfumes.  Do not shave 48 hours prior to surgery. Men may shave face and neck. Do not bring valuables to the hospital.    [facility] is not responsible for any belongings or  valuables.  Contacts, dentures/partials or body piercings may not be worn into surgery. Bring a case for your contacts, glasses or hearing aids, a denture cup will be supplied. Leave your suitcase in the car. After surgery it may be brought to your room. For patients admitted to the hospital, discharge time is determined by your treatment team.   Patients discharged the day of surgery will not be allowed to drive home.   Please read over the following fact sheets that you were given:   MRSA Information  __X__ Take these medicines the morning of surgery with A SIP OF WATER:  1. bisoprolol (ZEBETA) 5 MG tablet  2. levothyroxine (SYNTHROID, LEVOTHROID) 50 MCG tablet  3.   4.  5.  6.  ____ Fleet Enema (as directed)   __X__ Use CHG Soap/SAGE wipes as directed  ____ Use inhalers on the day of surgery  __X__ Stop metformin/Janumet/Farxiga 2 days prior to surgery    ____ Take 1/2 of usual insulin dose the night before surgery. No insulin the morning          of surgery.   __X__ Stop Blood Thinners Coumadin/Plavix/Xarelto/Pleta/Pradaxa/Eliquis/Effient/Aspirin  on   Or contact your Surgeon, Cardiologist or Medical Doctor regarding  ability to stop your blood thinners  __X__ Stop Anti-inflammatories 7 days before surgery such as Advil, Ibuprofen, Motrin,  BC or Goodies Powder, Naprosyn, Naproxen, Aleve, Aspirin    __X__ Stop all herbal supplements, fish oil or vitamin E until after surgery.    ____ Bring C-Pap to the hospital.

## 2020-07-06 ENCOUNTER — Ambulatory Visit: Payer: Medicare HMO

## 2020-07-06 ENCOUNTER — Encounter: Payer: Self-pay | Admitting: Orthopedic Surgery

## 2020-07-06 ENCOUNTER — Ambulatory Visit: Payer: Medicare HMO | Admitting: Anesthesiology

## 2020-07-06 ENCOUNTER — Encounter
Admission: RE | Disposition: A | Discharge status: Home / Self Care | Payer: Self-pay | Source: Home / Self Care | Attending: Orthopedic Surgery

## 2020-07-06 ENCOUNTER — Ambulatory Visit
Admission: RE | Admit: 2020-07-06 | Discharge: 2020-07-06 | Disposition: A | Discharge status: Home / Self Care | Payer: Medicare HMO | Attending: Orthopedic Surgery | Admitting: Orthopedic Surgery

## 2020-07-06 DIAGNOSIS — Z880 Allergy status to penicillin: Secondary | ICD-10-CM | POA: Insufficient documentation

## 2020-07-06 DIAGNOSIS — M8088XA Other osteoporosis with current pathological fracture, vertebra(e), initial encounter for fracture: Secondary | ICD-10-CM | POA: Insufficient documentation

## 2020-07-06 DIAGNOSIS — Z885 Allergy status to narcotic agent status: Secondary | ICD-10-CM | POA: Diagnosis not present

## 2020-07-06 DIAGNOSIS — Z881 Allergy status to other antibiotic agents status: Secondary | ICD-10-CM | POA: Insufficient documentation

## 2020-07-06 DIAGNOSIS — Z7984 Long term (current) use of oral hypoglycemic drugs: Secondary | ICD-10-CM | POA: Diagnosis not present

## 2020-07-06 DIAGNOSIS — I110 Hypertensive heart disease with heart failure: Secondary | ICD-10-CM | POA: Diagnosis not present

## 2020-07-06 DIAGNOSIS — Z79890 Hormone replacement therapy (postmenopausal): Secondary | ICD-10-CM | POA: Insufficient documentation

## 2020-07-06 DIAGNOSIS — S32040A Wedge compression fracture of fourth lumbar vertebra, initial encounter for closed fracture: Secondary | ICD-10-CM | POA: Diagnosis present

## 2020-07-06 DIAGNOSIS — I5032 Chronic diastolic (congestive) heart failure: Secondary | ICD-10-CM | POA: Insufficient documentation

## 2020-07-06 DIAGNOSIS — Z882 Allergy status to sulfonamides status: Secondary | ICD-10-CM | POA: Insufficient documentation

## 2020-07-06 DIAGNOSIS — Z888 Allergy status to other drugs, medicaments and biological substances status: Secondary | ICD-10-CM | POA: Diagnosis not present

## 2020-07-06 DIAGNOSIS — Z79899 Other long term (current) drug therapy: Secondary | ICD-10-CM | POA: Diagnosis not present

## 2020-07-06 DIAGNOSIS — Z7901 Long term (current) use of anticoagulants: Secondary | ICD-10-CM | POA: Diagnosis not present

## 2020-07-06 DIAGNOSIS — Z419 Encounter for procedure for purposes other than remedying health state, unspecified: Secondary | ICD-10-CM

## 2020-07-06 DIAGNOSIS — X58XXXA Exposure to other specified factors, initial encounter: Secondary | ICD-10-CM | POA: Diagnosis not present

## 2020-07-06 HISTORY — PX: KYPHOPLASTY: SHX5884

## 2020-07-06 LAB — GLUCOSE, CAPILLARY
Glucose-Capillary: 146 mg/dL — ABNORMAL HIGH (ref 70–99)
Glucose-Capillary: 117 mg/dL — ABNORMAL HIGH (ref 70–99)

## 2020-07-06 SURGERY — KYPHOPLASTY
Anesthesia: General

## 2020-07-06 MED ORDER — FAMOTIDINE 20 MG PO TABS
ORAL_TABLET | ORAL | Status: AC
  Administered 2020-07-06: 20 mg
  Filled 2020-07-06: qty 1

## 2020-07-06 MED ORDER — LIDOCAINE HCL (PF) 1 % IJ SOLN
INTRAMUSCULAR | Status: AC
  Filled 2020-07-06: qty 60

## 2020-07-06 MED ORDER — LIDOCAINE-EPINEPHRINE 1 %-1:100000 IJ SOLN
INTRAMUSCULAR | Status: AC
  Filled 2020-07-06: qty 2

## 2020-07-06 MED ORDER — BUPIVACAINE-EPINEPHRINE (PF) 0.5% -1:200000 IJ SOLN
INTRAMUSCULAR | Status: DC | PRN
  Administered 2020-07-06: 20 mL via PERINEURAL

## 2020-07-06 MED ORDER — CEFAZOLIN SODIUM-DEXTROSE 2-3 GM-%(50ML) IV SOLR
INTRAVENOUS | Status: DC | PRN
  Administered 2020-07-06 (×2): 1 g via INTRAVENOUS

## 2020-07-06 MED ORDER — ACETAMINOPHEN 10 MG/ML IV SOLN
1000.0000 mg | Freq: Once | INTRAVENOUS | Status: DC | PRN
Start: 2020-07-06 — End: 2020-07-07

## 2020-07-06 MED ORDER — TRAMADOL HCL 50 MG PO TABS: 50.0000 mg | ORAL_TABLET | Freq: Four times a day (QID) | ORAL | 1 refills | Status: DC | PRN

## 2020-07-06 MED ORDER — ONDANSETRON HCL 4 MG/2ML IJ SOLN: 4.0000 mg | Freq: Four times a day (QID) | INTRAMUSCULAR | Status: DC | PRN

## 2020-07-06 MED ORDER — FENTANYL CITRATE (PF) 100 MCG/2ML IJ SOLN
INTRAMUSCULAR | Status: AC
  Administered 2020-07-06: 25 ug via INTRAVENOUS
  Filled 2020-07-06: qty 2

## 2020-07-06 MED ORDER — SODIUM CHLORIDE 0.9 % IV SOLN: INTRAVENOUS | Status: DC

## 2020-07-06 MED ORDER — FENTANYL CITRATE (PF) 100 MCG/2ML IJ SOLN
INTRAMUSCULAR | Status: AC
  Filled 2020-07-06: qty 2

## 2020-07-06 MED ORDER — PROPOFOL 500 MG/50ML IV EMUL
INTRAVENOUS | Status: DC | PRN
  Administered 2020-07-06: 40 ug/kg/min via INTRAVENOUS

## 2020-07-06 MED ORDER — METOCLOPRAMIDE HCL 5 MG/ML IJ SOLN: 5.0000 mg | Freq: Three times a day (TID) | INTRAMUSCULAR | Status: DC | PRN

## 2020-07-06 MED ORDER — MIDAZOLAM HCL 2 MG/2ML IJ SOLN
INTRAMUSCULAR | Status: AC
  Filled 2020-07-06: qty 2

## 2020-07-06 MED ORDER — METOCLOPRAMIDE HCL 10 MG PO TABS: 5.0000 mg | ORAL_TABLET | Freq: Three times a day (TID) | ORAL | Status: DC | PRN

## 2020-07-06 MED ORDER — FENTANYL CITRATE (PF) 100 MCG/2ML IJ SOLN
INTRAMUSCULAR | Status: DC | PRN
  Administered 2020-07-06 (×2): 25 ug via INTRAVENOUS
  Administered 2020-07-06: 50 ug via INTRAVENOUS

## 2020-07-06 MED ORDER — ONDANSETRON HCL 4 MG/2ML IJ SOLN: 4.0000 mg | Freq: Once | INTRAMUSCULAR | Status: DC | PRN

## 2020-07-06 MED ORDER — ONDANSETRON HCL 4 MG PO TABS: 4.0000 mg | ORAL_TABLET | Freq: Four times a day (QID) | ORAL | Status: DC | PRN

## 2020-07-06 MED ORDER — CLINDAMYCIN PHOSPHATE 900 MG/50ML IV SOLN
INTRAVENOUS | Status: AC
  Filled 2020-07-06: qty 50

## 2020-07-06 MED ORDER — CHLORHEXIDINE GLUCONATE 0.12 % MT SOLN
OROMUCOSAL | Status: AC
  Filled 2020-07-06: qty 15

## 2020-07-06 MED ORDER — MIDAZOLAM HCL 2 MG/2ML IJ SOLN
INTRAMUSCULAR | Status: DC | PRN
  Administered 2020-07-06 (×2): .5 mg via INTRAVENOUS

## 2020-07-06 MED ORDER — FENTANYL CITRATE (PF) 100 MCG/2ML IJ SOLN
25.0000 ug | INTRAMUSCULAR | Status: DC | PRN
  Administered 2020-07-06: 25 ug via INTRAVENOUS

## 2020-07-06 MED ORDER — LIDOCAINE HCL 1 % IJ SOLN
INTRAMUSCULAR | Status: DC | PRN
  Administered 2020-07-06: 20 mL
  Administered 2020-07-06: 10 mL

## 2020-07-06 MED ORDER — BUPIVACAINE-EPINEPHRINE (PF) 0.5% -1:200000 IJ SOLN
INTRAMUSCULAR | Status: AC
  Filled 2020-07-06: qty 30

## 2020-07-06 SURGICAL SUPPLY — 26 items
15/3 KYPHON XPANDER BONE TAMP IMPLANT
ADH SKN CLS APL DERMABOND .7 (GAUZE/BANDAGES/DRESSINGS) ×2
BNDG ADH 2 X3.75 FABRIC TAN LF (GAUZE/BANDAGES/DRESSINGS) ×4 IMPLANT
BNDG ADH XL 3.75X2 STRCH LF (GAUZE/BANDAGES/DRESSINGS) ×2
CEMENT KYPHON CX01A KIT/MIXER (Cement) ×2 IMPLANT
COVER WAND RF STERILE (DRAPES) ×2 IMPLANT
DERMABOND ADVANCED (GAUZE/BANDAGES/DRESSINGS) ×2
DERMABOND ADVANCED .7 DNX12 (GAUZE/BANDAGES/DRESSINGS) ×2 IMPLANT
DEVICE BIOPSY BONE KYPHX (INSTRUMENTS) ×2 IMPLANT
DRAPE C-ARM XRAY 36X54 (DRAPES) ×2 IMPLANT
DURAPREP 26ML APPLICATOR (WOUND CARE) ×2 IMPLANT
FEE RENTAL RFA GENERATOR (MISCELLANEOUS) IMPLANT
First Fracture Kyphopak II Tray IMPLANT
GLOVE SURG SYN 9.0  PF PI (GLOVE) ×2
GLOVE SURG SYN 9.0 PF PI (GLOVE) ×1 IMPLANT
GOWN SRG 2XL LVL 4 RGLN SLV (GOWNS) ×1 IMPLANT
GOWN STRL NON-REIN 2XL LVL4 (GOWNS) ×2
GOWN STRL REUS W/ TWL LRG LVL3 (GOWN DISPOSABLE) ×1 IMPLANT
GOWN STRL REUS W/TWL LRG LVL3 (GOWN DISPOSABLE) ×2
MANIFOLD NEPTUNE II (INSTRUMENTS) ×2 IMPLANT
PACK KYPHOPLASTY (MISCELLANEOUS) ×2 IMPLANT
RENTAL RFA GENERATOR (MISCELLANEOUS) IMPLANT
STRAP SAFETY 5IN WIDE (MISCELLANEOUS) ×2 IMPLANT
SWABSTK COMLB BENZOIN TINCTURE (MISCELLANEOUS) ×2 IMPLANT
TRAY KYPHOPAK 15/3 EXPRESS 1ST (MISCELLANEOUS) ×1 IMPLANT
TRAY KYPHOPAK 20/3 EXPRESS 1ST (MISCELLANEOUS) ×2 IMPLANT

## 2020-07-06 NOTE — Discharge Instructions (Addendum)
Take it easy today and try to get up and walk all you can tomorrow Band-Aids to be removed on Thursday then okay to shower Pain medicine as directed previously Call office if having problems   AMBULATORY SURGERY  DISCHARGE INSTRUCTIONS   1) The drugs that you were given will stay in your system until tomorrow so for the next 24 hours you should not:  A) Drive an automobile B) Make any legal decisions C) Drink any alcoholic beverage   2) You may resume regular meals tomorrow.  Today it is better to start with liquids and gradually work up to solid foods.  You may eat anything you prefer, but it is better to start with liquids, then soup and crackers, and gradually work up to solid foods.   3) Please notify your doctor immediately if you have any unusual bleeding, trouble breathing, redness and pain at the surgery site, drainage, fever, or pain not relieved by medication.    4) Additional Instructions:        Please contact your physician with any problems or Same Day Surgery at [phone], Monday through Friday 6 am to 4 pm, or [place] at [facility] number at [phone].

## 2020-07-06 NOTE — Transfer of Care (Signed)
Immediate Anesthesia Transfer of Care Note  Patient: SHOUA ULLOA  Procedure(s) Performed: L4  KYPHOPLASTY (N/A )  Patient Location: PACU  Anesthesia Type:General  Level of Consciousness: awake, alert  and oriented  Airway & Oxygen Therapy: Patient Spontanous Breathing and Patient connected to nasal cannula oxygen  Post-op Assessment: Report given to RN and Post -op Vital signs reviewed and stable  Post vital signs: Reviewed and stable  Last Vitals:  Vitals Value Taken Time  BP 150/87 07/06/20 1810  Temp 36.3 C 07/06/20 1810  Pulse 29 07/06/20 1814  Resp 14 07/06/20 1814  SpO2 91 % 07/06/20 1814  Vitals shown include unvalidated device data.  Last Pain:  Vitals:   07/06/20 1605  TempSrc: Temporal  PainSc: 0-No pain         Complications: No complications documented.

## 2020-07-06 NOTE — H&P (Signed)
Jodi Reeves is an 87 y.o. female.   Chief Complaint: Severe low back pain HPI: Patient is a 87 year old who had an L1 compression fracture in the past and recently had a T12 compression fracture with successful kyphoplasty and pain relief.  Subsequently she was in rehab and had increasing low back pain was unable to continue with her rehab secondary to the severe pain.  A repeat MRI was obtained and showed a new fracture at L4.  She has had no relief with narcotics orally and has been unable to pursue participate in her rehabilitation shows but she is being brought in today for outpatient L4 kyphoplasty.  Past Medical History:  Diagnosis Date  . Allergy   . Anxiety   . Balance disorder   . Chronic diastolic CHF (congestive heart failure) ([place])   . Colon polyps   . Coronary artery disease, non-occlusive    a. LHC 11/2003: 10% LAD stenosis, 20% LCx stenosis, and 40% RCA stenosis; b. nuc stress test 12/15: small region of mild ischemia in the apical region with WMA also noted in the apical region, EF 60%. She declined invasive evaluation at that time  . DM2 (diabetes mellitus, type 2) ([place])   . Fall    04/2017 see care[facility]where [facility] multiple fractures, brusing   . Falls frequently    h/o left shoudler/wrist fracture and left fingers numb  . GERD (gastroesophageal reflux disease)    esophageal web  . 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 History of chicken pox   . History of eating disorder   . HLD (hyperlipidemia)   . HTN (hypertension)   . Hypothyroidism   . IBS (irritable bowel syndrome)    diarrhea   . Incontinence of bowel   . Osteopenia   . Osteoporosis   . Ovarian cancer ([place])    1980  . Persistent atrial fibrillation ([employer])    a. CHADS2VASc = > 8 (CHF, HTN, age x 2, DM, TIA x 2, female); b. on Xarelto  . SBO (small bowel obstruction) ([place])    1998  . TIA (transient ischemic attack)   . UTI (urinary tract infection)   . Venous insufficiency   . Ventral  hernia     Past Surgical History:  Procedure Laterality Date  . 2nd look laparotomy  1982  . ABDOMINAL HYSTERECTOMY     for ovarian cancer s/p hysterectomy total 1976 and exp lap 1980  . APPENDECTOMY    . BACK SURGERY    . CARDIAC CATHETERIZATION  8/05   neg; a fib found   . CARPAL TUNNEL RELEASE Left 03/06/2018   Procedure: CARPAL TUNNEL RELEASE ENDOSCOPIC;  Surgeon: Corky Mull, MD;  Location: [facility];  Service: Orthopedics;  Laterality: Left;  diabetic - oral meds  . CARPAL TUNNEL RELEASE     left CTS Dr.Poggi  . cataract OD  2002  . cataract surgery  5/07   R  . CHOLECYSTECTOMY  1986  . DEXA  9/04 and 1/02  . EGD/dilation/colon  1/06  . ESOPHAGOGASTRODUODENOSCOPY (EGD) WITH PROPOFOL N/A 05/16/2016   Procedure: ESOPHAGOGASTRODUODENOSCOPY (EGD) WITH PROPOFOL with dilation;  Surgeon: Lucilla Lame, MD;  Location: [facility];  Service: Endoscopy;  Laterality: N/A;  . FEMORAL HERNIA REPAIR  1958   R  . fracture L elbow/wrist  2000  . intussception/obstruction  12/98  . JOINT REPLACEMENT     left shoulder  . kidney stone x2  1991  . KYPHOPLASTY N/A 04/13/2020  Procedure: [id number];  Surgeon: Hessie Knows, MD;  Location: [facility];  Service: Orthopedics;  Laterality: N/A;  . KYPHOPLASTY N/A 05/18/2020   Procedure: L1 KYPHOPLASTY;  Surgeon: Hessie Knows, MD;  Location: [facility];  Service: Orthopedics;  Laterality: N/A;  . laminectomy L4-5  1971  . LUMBAR LAMINECTOMY     1970 ruptured disc   . MOUTH SURGERY    . myoview stress  8/05   syncope (-)  . REVERSE SHOULDER ARTHROPLASTY Left 10/06/2014   Procedure: REVERSE SHOULDER ARTHROPLASTY;  Surgeon: Corky Mull, MD;  Location: [facility];  Service: Orthopedics;  Laterality: Left;  . stillbirth  1969  . TOTAL ABDOMINAL HYSTERECTOMY W/ BILATERAL SALPINGOOPHORECTOMY  1980   ovarian cancer  . WRIST FRACTURE SURGERY  11/05   R    Family History  Problem Relation Age of Onset  . Early death Father   .  Diabetes Mother   . Heart disease Mother   . Arthritis Brother   . Cancer Brother   . Depression Brother   . Hearing loss Brother   . Arthritis Brother   . Cancer Brother        colon  . Hearing loss Brother   . Cancer Brother   . Diabetes Brother   . Hearing loss Brother   . Heart disease Brother   . Hyperlipidemia Brother   . Heart disease Sister   . Hypertension Sister   . Stroke Sister   . Hearing loss Daughter   . Hypertension Daughter   . Diabetes Paternal Grandfather   . Hearing loss Sister   . Heart disease Sister   . Arthritis Sister   . Depression Sister   . Hearing loss Sister   . Heart disease Sister   . COPD Brother   . Early death Brother   . Early death Brother    Social History:  reports that she has never smoked. She has never used smokeless tobacco. She reports that she does not drink alcohol and does not use drugs.  Allergies:  Allergies  Allergen Reactions  . Amiodarone Other (See Comments)    Severe Thyroid issues   . Fosamax [Alendronate]     dysphagia  . Penicillins Anaphylaxis and Other (See Comments)    Has patient had a PCN reaction causing immediate rash, facial/tongue/throat swelling, SOB or lightheadedness with hypotension: Yes Has patient had a PCN reaction causing severe rash involving mucus membranes or skin necrosis: No Has patient had a PCN reaction that required hospitalization: No Has patient had a PCN reaction occurring within the last 10 years: No If all of the above answers are "NO", then may proceed with Cephalosporin use.   . Sulfa Antibiotics Itching and Rash    Itching   . Actos [Pioglitazone]     Not effective   . Amaryl [Glimepiride]     Not effective    . Atorvastatin Other (See Comments)    Muscle aches & All statins per Patient  . Bentyl [Dicyclomine Hcl]     Could not tolerate nausea, reduced concentration, h/a   . Ciprofloxacin Other (See Comments)    High blood pressure    . Codeine Nausea And Vomiting   . Ezetimibe-Simvastatin Other (See Comments)    Muscle aches, nausea, back pain   . Lovastatin Other (See Comments)    Myalgias  . Macrobid [Nitrofurantoin Macrocrystal]     Severe Itching, rash   . Protonix [Pantoprazole Sodium]     Esophageal problems  Wants removed  from list  . Rosiglitazone Maleate Other (See Comments)    Edema  . Statins     Muscle and joint aches to all statins  . Victoza [Liraglutide]     Nausea   . Alendronate Sodium Rash    Dysphagia and ulceration   . Bactrim [Sulfamethoxazole-Trimethoprim] Rash    itching    Medications Prior to Admission  Medication Sig Dispense Refill  . acetaminophen (TYLENOL) 500 MG tablet Take 2 tablets (1,000 mg total) by mouth every 6 (six) hours as needed for moderate pain. 60 tablet 0  . bisoprolol (ZEBETA) 5 MG tablet Take 5 mg by mouth daily.    . Coenzyme Q10 50 MG TABS Take 10 mg by mouth daily.    Marland Kitchen docusate sodium (COLACE) 100 MG capsule Take 100 mg by mouth daily as needed for mild constipation.    . furosemide (LASIX) 20 MG tablet Take 1 tablet (20 mg total) by mouth 2 (two) times daily. (Patient taking differently: Take 20 mg by mouth 2 (two) times daily. Has been taking 40mg  QD in AM) 190 tablet 3  . levothyroxine (SYNTHROID, LEVOTHROID) 50 MCG tablet Take 50 mcg by mouth daily before breakfast.    . losartan (COZAAR) 25 MG tablet Take 0.5 tablets (12.5 mg total) by mouth daily. Take if BP >110 sbp 45 tablet 0  . Magnesium Oxide 250 MG TABS Take 250 mg by mouth daily.    . metFORMIN (GLUCOPHAGE-XR) 500 MG 24 hr tablet Take 500 mg by mouth 2 (two) times daily.     . Multiple Vitamins-Minerals (MULTIVITAMIN GUMMIES ADULT PO) Take 1 each by mouth daily.    . Olopatadine HCl 0.2 % SOLN Place 1 drop into both eyes daily as needed (Allergies).    . Omega-3 1000 MG CAPS Take 1,000 mg by mouth 2 (two) times daily.    . potassium chloride (KLOR-CON) 10 MEQ tablet Take 10 mEq by mouth 2 (two) times daily.    . Probiotic  Product (ALIGN) 4 MG CAPS Take 4 mg by mouth daily.    . psyllium (METAMUCIL) 58.6 % packet Take 1 packet by mouth daily as needed (constipation).    . Skin Protectants, Misc. (DIMETHICONE-ZINC OXIDE) cream Apply topically 2 (two) times daily as needed for dry skin. Please label the bottle for the vaginal area only. 142 g 0  . traMADol (ULTRAM) 50 MG tablet Take 1 tablet (50 mg total) by mouth every 6 (six) hours as needed. (Patient taking differently: Take 12.5 mg by mouth every 6 (six) hours as needed for severe pain.) 20 tablet 0  . vitamin B-12 (CYANOCOBALAMIN) 1000 MCG tablet Take 1,000 mcg by mouth daily. W/ Folate and B6 sublingual    . XARELTO 20 MG TABS tablet Take 1 tablet (20 mg total) by mouth daily with supper. 30 tablet 5  . Ascorbic Acid (VITAMIN C) POWD Take 2,000 mg by mouth daily.    Marland Kitchen estradiol (ESTRACE) 0.1 MG/GM vaginal cream APPLY 0.5MG  (PEA SIZED AMOUNT) JUST INSIDE THE VAGINA WITH FINGER-TIP ON MONDAY, WEDNESDAY AND FRIDAY NIGHTS. 42.5 g 0  . glucose blood (ONE TOUCH ULTRA TEST) test strip Use to test blood sugar once daily E11.49 (Patient taking differently: Use to test blood sugar twice daily E11.49) 100 each 1    Results for orders placed or performed during the hospital encounter of 07/06/20 (from the past 48 hour(s))  Glucose, capillary     Status: Abnormal   Collection Time: 07/06/20  4:02 PM  Result Value Ref Range   Glucose-Capillary 146 (H) 70 - 99 mg/dL    Comment: Glucose reference range applies only to samples taken after fasting for at least 8 hours.   No results found.  Review of Systems  Blood pressure (!) 162/98, pulse 97, temperature (!) 97.4 F (36.3 C), temperature source Temporal, resp. rate 16, height 5\' 3"  (1.6 m), weight 63.5 kg, SpO2 100 %. Physical Exam  Tender to percussion at L4 with no neuro deficit Assessment/Plan L4 compression fracture with severe pain unresponsive to nonoperative measures Plan is for L4 kyphoplasty in hopes that she  will resume her rehab tomorrow.  Hessie Knows, MD 07/06/2020, 4:57 PM

## 2020-07-06 NOTE — Anesthesia Postprocedure Evaluation (Signed)
Anesthesia Post Note  Patient: Jodi Reeves  Procedure(s) Performed: L4  KYPHOPLASTY (N/A )  Patient location during evaluation: PACU Anesthesia Type: General Level of consciousness: awake and alert Pain management: pain level controlled Vital Signs Assessment: post-procedure vital signs reviewed and stable Respiratory status: spontaneous breathing, nonlabored ventilation, respiratory function stable and patient connected to nasal cannula oxygen Cardiovascular status: blood pressure returned to baseline and stable Postop Assessment: no apparent nausea or vomiting Anesthetic complications: no   No complications documented.   Last Vitals:  Vitals:   07/06/20 1845 07/06/20 1857  BP: (!) 141/83   Pulse: 80 86  Resp: 19 19  Temp:  36.9 C  SpO2: 96% 96%    Last Pain:  Vitals:   07/06/20 1857  TempSrc:   PainSc: 3                  Arita Miss

## 2020-07-06 NOTE — Anesthesia Preprocedure Evaluation (Signed)
Anesthesia Evaluation  Patient identified by MRN, date of birth, ID band Patient awake    Reviewed: Allergy & Precautions, NPO status , Patient's Chart, lab work & pertinent test results  History of Anesthesia Complications Negative for: history of anesthetic complications  Airway Mallampati: III  TM Distance: >3 FB Neck ROM: Full    Dental  (+) Teeth Intact, Poor Dentition   Pulmonary neg pulmonary ROS, neg sleep apnea, neg COPD, Patient abstained from smoking.Not current smoker,    Pulmonary exam normal breath sounds clear to auscultation       Cardiovascular Exercise Tolerance: Poor METShypertension, + CAD, + Peripheral Vascular Disease and +CHF  (-) Past MI (-) dysrhythmias + Valvular Problems/Murmurs  Rhythm:Irregular Rate:Normal - Systolic murmurs    Neuro/Psych PSYCHIATRIC DISORDERS Anxiety TIA Neuromuscular disease    GI/Hepatic GERD  ,(+)     (-) substance abuse  ,   Endo/Other  diabetesHypothyroidism   Renal/GU negative Renal ROS     Musculoskeletal   Abdominal   Peds  Hematology   Anesthesia Other Findings Past Medical History: No date: Allergy No date: Anxiety No date: Balance disorder No date: Chronic diastolic CHF (congestive heart failure) (HCC) No date: Colon polyps No date: Coronary artery disease, non-occlusive     Comment:  a. LHC 11/2003: 10% LAD stenosis, 20% LCx stenosis, and               40% RCA stenosis; b. nuc stress test 12/15: small region               of mild ischemia in the apical region with WMA also noted              in the apical region, EF 60%. She declined invasive               evaluation at that time No date: DM2 (diabetes mellitus, type 2) ([place]) No date: Fall     Comment:  04/2017 see careeverywhere [facility] multiple fractures,               brusing  No date: Falls frequently     Comment:  h/o left shoudler/wrist fracture and left fingers numb No date: GERD  (gastroesophageal reflux disease)     Comment:  esophageal web No date: Granuloma annulare     Comment:  [name]. since 2010 No date: History of chicken pox No date: History of eating disorder No date: HLD (hyperlipidemia) No date: HTN (hypertension) No date: Hypothyroidism No date: IBS (irritable bowel syndrome)     Comment:  diarrhea  No date: Incontinence of bowel No date: Osteoporosis No date: Ovarian cancer ([place])     Comment:  1980 No date: Persistent atrial fibrillation ([place])     Comment:  a. CHADS2VASc = > 8 (CHF, HTN, age x 2, DM, TIA x 2,               female); b. on Xarelto No date: SBO (small bowel obstruction) ([place])     Comment:  1998 No date: TIA (transient ischemic attack) No date: UTI (urinary tract infection) No date: Venous insufficiency  Reproductive/Obstetrics                             Anesthesia Physical  Anesthesia Plan  ASA: III  Anesthesia Plan: General   Post-op Pain Management:    Induction: Intravenous  PONV Risk Score and Plan: 3 and Ondansetron, Propofol infusion and  TIVA  Airway Management Planned: Nasal Cannula and Natural Airway  Additional Equipment: None  Intra-op Plan:   Post-operative Plan:   Informed Consent: I have reviewed the patients History and Physical, chart, labs and discussed the procedure including the risks, benefits and alternatives for the proposed anesthesia with the patient or authorized representative who has indicated his/her understanding and acceptance.     Dental advisory given  Plan Discussed with: CRNA and Surgeon  Anesthesia Plan Comments: (Discussed risks of anesthesia with patient, including possibility of difficulty with spontaneous ventilation under anesthesia necessitating airway intervention, PONV, and rare risks such as cardiac or respiratory or neurological events. Patient understands.)        Anesthesia Quick Evaluation                                   Anesthesia Evaluation  Patient identified by MRN, date of birth, ID band Patient awake    Reviewed: Allergy & Precautions, NPO status , Patient's Chart, lab work & pertinent test results  History of Anesthesia Complications Negative for: history of anesthetic complications  Airway Mallampati: III  TM Distance: >3 FB Neck ROM: Full    Dental   [place] :   Pulmonary neg pulmonary ROS,    Pulmonary exam normal breath sounds clear to auscultation       Cardiovascular hypertension, + CAD and +CHF  + dysrhythmias (a fib on xarelto)  Rhythm:Irregular Rate:Normal  ECG 08/21/17: atrial fibrillation; left axis deviation; septal infarct, age undetermined  Myocardial perfusion 04/06/17:   Horizontal ST segment depression ST segment depression of 1 mm was noted during stress in the II, III and aVF leads.  The study is normal.  This is a low risk study.  The left ventricular ejection fraction is hyperdynamic (>65%).   Neuro/Psych PSYCHIATRIC DISORDERS Anxiety Depression TIAnegative neurological ROS     GI/Hepatic GERD  ,Esophageal web, hx SBO, IBS   Endo/Other  diabetes, Type 2Hypothyroidism   Renal/GU negative Renal ROS     Musculoskeletal   Abdominal   Peds  Hematology negative hematology ROS (+)   Anesthesia Other Findings Cardiology note 08/21/17:  ASSESSMENT AND PLAN:  Mixed hyperlipidemia  CT scan including mild coronary calcifications, aortic arch calcifications, moderate aortic atherosclerosis in the abdominal aorta  does not want a statin stable  No further work-up  Essential hypertension, benign - Plan: EKG 12-Lead Blood pressure is well controlled on today's visit. No changes made to the medications.  Persistent atrial fibrillation (HCC) Heart rate well controlled,  Tolerating Xarelto, no recent falls No changes made  Dark urine She is concerned about hematuria but no proof of this Recommended if she has any other days with very  dark urine that she contact primary care, may need urinalysis  if she does have confirmation of hematuria would likely benefit from urology but currently reports urine is back to normal color  Chronic diastolic CHF (congestive heart failure) ([place]) Takes Lasix daily, occasionally with extra dose potassium daily Appears euvolemic   Total encounter time more than 45 minutes  Greater than 50% was spent in counseling and coordination of care with the patient  Disposition:   F/U  12 months  Reproductive/Obstetrics                            Anesthesia Physical Anesthesia Plan  ASA: III  Anesthesia Plan: General and  Consulting civil engineer Block-LIDOCAINE ONLY   Post-op Pain Management:  Regional for Post-op pain and GA combined w/ Regional for post-op pain   Induction: Intravenous  PONV Risk Score and Plan: 3 and Propofol infusion and TIVA  Airway Management Planned: Natural Airway  Additional Equipment:   Intra-op Plan:   Post-operative Plan:   Informed Consent: I have reviewed the patients History and Physical, chart, labs and discussed the procedure including the risks, benefits and alternatives for the proposed anesthesia with the patient or authorized representative who has indicated his/her understanding and acceptance.     Plan Discussed with: CRNA  Anesthesia Plan Comments:         Anesthesia Quick Evaluation

## 2020-07-06 NOTE — Op Note (Signed)
07/06/2020  6:14 PM  PATIENT:  Jodi Reeves   MRN: [id number]   PRE-OPERATIVE DIAGNOSIS:  closed wedge compression fracture of L4   POST-OPERATIVE DIAGNOSIS:  closed wedge compression fracture of L4   PROCEDURE:  Procedure(s): KYPHOPLASTY L4  SURGEON: Laurene Footman, MD   ASSISTANTS: None   ANESTHESIA:   local and MAC   EBL:  No intake/output data recorded.   BLOOD ADMINISTERED:none   DRAINS: none    LOCAL MEDICATIONS USED:  MARCAINE    and XYLOCAINE    SPECIMEN:   L4 vertebral body biopsy   DISPOSITION OF SPECIMEN:  Pathology   COUNTS:  YES   TOURNIQUET:  * No tourniquets in log *   IMPLANTS: Bone cement   DICTATION: .Dragon Dictation  patient was brought to the operating room and after adequate anesthesia was obtained the patient was placed prone.  C arm was brought in in good visualization of the affected level obtained on both AP and lateral projections.  After patient identification and timeout procedures were completed, local anesthetic was infiltrated with 10 cc 1% Xylocaine infiltrated subcutaneously.  This is done the area on the each side of the planned approach.  The back was then prepped and draped in the usual sterile manner and repeat timeout procedure carried out.  A spinal needle was brought down to the pedicle on the each side of  L4 and a 50-50 mix of 1% Xylocaine half percent Sensorcaine with epinephrine total of 20 cc injected on each side.  After allowing this to set a small incision was made and the trocar was advanced into the vertebral body in an extrapedicular fashion.  Biopsy was obtained Drilling was carried out balloon inserted with inflation to  3 cc on the right and 3 cc on the left.  When the cement was appropriate consistency for cc were injected on the right and 3-1/2 cc on the left into the vertebral body without extravasation, good fill superior to inferior endplates and from right to left sides along the inferior endplate.  After the cement  had set the trochar was removed and permanent C-arm views obtained.  The wound was closed with Dermabond followed by Band-Aid   PLAN OF CARE:  Discharge home after recovery room   PATIENT DISPOSITION:  PACU - hemodynamically stable.

## 2020-07-06 NOTE — Anesthesia Procedure Notes (Signed)
Performed by: Alexsia Klindt, CRNA Pre-anesthesia Checklist: Patient identified, Emergency Drugs available, Suction available, Patient being monitored and Timeout performed Oxygen Delivery Method: Nasal cannula       

## 2020-07-07 ENCOUNTER — Encounter: Payer: Self-pay | Admitting: Orthopedic Surgery

## 2020-07-08 LAB — SURGICAL PATHOLOGY

## 2020-07-09 ENCOUNTER — Telehealth: Payer: Self-pay

## 2020-07-09 NOTE — Telephone Encounter (Signed)
Faxed signed add on discipline order for [place]  [phone]

## 2020-07-14 ENCOUNTER — Telehealth: Payer: Self-pay

## 2020-07-14 NOTE — Telephone Encounter (Signed)
Faxed signed physician's verbal orders to [facility] to [phone] on 07/13/20 about speech therapy

## 2020-07-14 NOTE — Telephone Encounter (Signed)
Faxed order number [id number] back to [facility] on 07/14/20

## 2020-07-19 NOTE — Telephone Encounter (Signed)
Faxed signed order number of [id number] to [phone]  SN effective 07/11/20 1wk6 1every2wk2 ST effective 07/11/2020 1wk6

## 2020-07-21 ENCOUNTER — Other Ambulatory Visit: Payer: Self-pay | Admitting: Urology

## 2020-08-09 ENCOUNTER — Other Ambulatory Visit: Payer: Self-pay | Admitting: Cardiovascular Disease

## 2020-08-11 NOTE — Telephone Encounter (Signed)
Faxed order number [id number] to [facility] at [phone] on 08/11/20 ST effective 08/08/20 [id number]

## 2020-08-16 ENCOUNTER — Ambulatory Visit: Payer: Medicare HMO | Admitting: Gastroenterology

## 2020-08-16 ENCOUNTER — Other Ambulatory Visit: Payer: Self-pay

## 2020-08-16 ENCOUNTER — Encounter: Payer: Self-pay | Admitting: Gastroenterology

## 2020-08-16 VITALS — BP 147/71 | HR 82 | Temp 97.0°F | Ht 63.0 in | Wt 138.8 lb

## 2020-08-16 DIAGNOSIS — K582 Mixed irritable bowel syndrome: Secondary | ICD-10-CM | POA: Diagnosis not present

## 2020-08-16 NOTE — Progress Notes (Signed)
Primary Care Physician: [name], Nino Glow, MD  Primary Gastroenterologist:  Dr. Lucilla Lame  Chief Complaint  Patient presents with  . Constipation    diarrhea    HPI: Jodi Reeves is a 87 y.o. female here for follow-up of alternating diarrhea and constipation.  The patient states that she will not move her bowels for a few days and then it will be very hard and she'll have resulting diarrhea for some time.  The patient had stopped her MiraLAX and she states she does not take Colace anymore.  The patient also states that she never tried the dicyclomine we talked about last visit. The patient comes with a caregiver who reports that the patient is having a lot of gas.  She has moved over to Lactaid milk.  There is no report of any unexplained weight loss fevers chills nausea or vomiting.  She reports that she still has trouble swallowing sometimes and has been reminded that her barium swallow showed no obstruction and the 13 mm pill went down without a problem less than a year ago.  The patient also states that she stopped taking fiber.  Past Medical History:  Diagnosis Date  . Allergy   . Anxiety   . Balance disorder   . Chronic diastolic CHF (congestive heart failure) ([place])   . Colon polyps   . Coronary artery disease, non-occlusive    a. LHC 11/2003: 10% LAD stenosis, 20% LCx stenosis, and 40% RCA stenosis; b. nuc stress test 12/15: small region of mild ischemia in the apical region with WMA also noted in the apical region, EF 60%. She declined invasive evaluation at that time  . DM2 (diabetes mellitus, type 2) ([place])   . Fall    04/2017 see careeverywhere [facility] multiple fractures, brusing   . Falls frequently    h/o left shoudler/wrist fracture and left fingers numb  . GERD (gastroesophageal reflux disease)    esophageal web  . 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 History of chicken pox   . History of eating disorder   . HLD (hyperlipidemia)   . HTN  (hypertension)   . Hypothyroidism   . IBS (irritable bowel syndrome)    diarrhea   . Incontinence of bowel   . Osteopenia   . Osteoporosis   . Ovarian cancer ([place])    1980  . Persistent atrial fibrillation ([place])    a. CHADS2VASc = > 8 (CHF, HTN, age x 2, DM, TIA x 2, female); b. on Xarelto  . SBO (small bowel obstruction) ([place])    1998  . TIA (transient ischemic attack)   . UTI (urinary tract infection)   . Venous insufficiency   . Ventral hernia     Current Outpatient Medications  Medication Sig Dispense Refill  . acetaminophen (TYLENOL) 500 MG tablet Take 2 tablets (1,000 mg total) by mouth every 6 (six) hours as needed for moderate pain. 60 tablet 0  . Ascorbic Acid (VITAMIN C) POWD Take 2,000 mg by mouth daily.    . bisoprolol (ZEBETA) 5 MG tablet Take 5 mg by mouth daily.    . Coenzyme Q10 50 MG TABS Take 10 mg by mouth daily.    Marland Kitchen docusate sodium (COLACE) 100 MG capsule Take 100 mg by mouth daily as needed for mild constipation.    Marland Kitchen estradiol (ESTRACE) 0.1 MG/GM vaginal cream APPLY 0.5MG  (PEA SIZED AMOUNT) JUST INSIDE THE VAGINA WITH FINGER-TIP ON MONDAY, WEDNESDAY AND FRIDAY NIGHTS.  42.5 g 0  . furosemide (LASIX) 20 MG tablet Take 1 tablet (20 mg total) by mouth 2 (two) times daily. Has been taking 40mg  QD in AM 60 tablet 1  . glucose blood (ONE TOUCH ULTRA TEST) test strip Use to test blood sugar once daily E11.49 (Patient taking differently: Use to test blood sugar twice daily E11.49) 100 each 1  . levothyroxine (SYNTHROID, LEVOTHROID) 50 MCG tablet Take 50 mcg by mouth daily before breakfast.    . losartan (COZAAR) 25 MG tablet Take 0.5 tablets (12.5 mg total) by mouth daily. Take if BP >110 sbp 45 tablet 0  . Magnesium Oxide 250 MG TABS Take 250 mg by mouth daily.    . metFORMIN (GLUCOPHAGE-XR) 500 MG 24 hr tablet Take 500 mg by mouth 2 (two) times daily.     . Multiple Vitamins-Minerals (MULTIVITAMIN GUMMIES ADULT PO) Take 1 each by mouth daily.    . Olopatadine HCl 0.2  % SOLN Place 1 drop into both eyes daily as needed (Allergies).    . Omega-3 1000 MG CAPS Take 1,000 mg by mouth 2 (two) times daily.    . potassium chloride (KLOR-CON) 10 MEQ tablet Take 10 mEq by mouth 2 (two) times daily.    . Probiotic Product (ALIGN) 4 MG CAPS Take 4 mg by mouth daily.    . psyllium (METAMUCIL) 58.6 % packet Take 1 packet by mouth daily as needed (constipation).    . Skin Protectants, Misc. (DIMETHICONE-ZINC OXIDE) cream Apply topically 2 (two) times daily as needed for dry skin. Please label the bottle for the vaginal area only. 142 g 0  . traMADol (ULTRAM) 50 MG tablet Take 1 tablet (50 mg total) by mouth every 6 (six) hours as needed for severe pain. 20 tablet 1  . vitamin B-12 (CYANOCOBALAMIN) 1000 MCG tablet Take 1,000 mcg by mouth daily. W/ Folate and B6 sublingual    . XARELTO 20 MG TABS tablet Take 1 tablet (20 mg total) by mouth daily with supper. 30 tablet 5   No current facility-administered medications for this visit.    Allergies as of 08/16/2020 - Review Complete 08/16/2020  Allergen Reaction Noted  . Amiodarone Other (See Comments) 05/10/2017  . Fosamax [alendronate]  08/21/2017  . Penicillins Anaphylaxis and Other (See Comments)   . Sulfa antibiotics Itching and Rash 10/12/2017  . Actos [pioglitazone]  08/21/2017  . Amaryl [glimepiride]  08/21/2017  . Atorvastatin Other (See Comments)   . Bentyl [dicyclomine hcl]  03/10/2020  . Ciprofloxacin Other (See Comments) 09/17/2015  . Codeine Nausea And Vomiting   . Ezetimibe-simvastatin Other (See Comments)   . Lovastatin Other (See Comments)   . Macrobid [nitrofurantoin macrocrystal]  10/12/2017  . Protonix [pantoprazole sodium]  10/12/2017  . Rosiglitazone maleate Other (See Comments)   . Statins  05/10/2017  . Victoza [liraglutide]  10/12/2017  . Alendronate sodium Rash   . Bactrim [sulfamethoxazole-trimethoprim] Rash 09/17/2015    ROS:  General: Negative for anorexia, weight loss, fever, chills,  fatigue, weakness. ENT: Negative for hoarseness, difficulty swallowing , nasal congestion. CV: Negative for chest pain, angina, palpitations, dyspnea on exertion, peripheral edema.  Respiratory: Negative for dyspnea at rest, dyspnea on exertion, cough, sputum, wheezing.  GI: See history of present illness. GU:  Negative for dysuria, hematuria, urinary incontinence, urinary frequency, nocturnal urination.  Endo: Negative for unusual weight change.    Physical Examination:   BP (!) 147/71   Pulse 82   Temp (!) 97 F (36.1 C) (Temporal)  Ht 5\' 3"  (1.6 m)   Wt 138 lb 12.8 oz (63 kg)   BMI 24.59 kg/m   General: Well-nourished, well-developed in no acute distress.  Eyes: No icterus. Conjunctivae pink. Neuro: Alert and oriented x 3.  Grossly intact. Skin: Warm and dry, no jaundice.   Psych: Alert and cooperative, normal mood and affect.  Labs:    Imaging Studies: No results found.  Assessment and Plan:   Jodi Reeves is a 87 y.o. y/o female who has a history of irritable bowel syndrome with alternating diarrhea and constipation.  The patient has been told to go back on her fiber. She is also been told because of her gas and irritable bowel syndrome that she should try a low FODMAP diet.  The patient has been explained the plan and agrees with it.     Lucilla Lame, MD. Marval Regal    Note: This dictation was prepared with Dragon dictation along with smaller phrase technology. Any transcriptional errors that result from this process are unintentional.

## 2020-08-23 ENCOUNTER — Telehealth: Payer: Self-pay

## 2020-08-23 NOTE — Telephone Encounter (Signed)
Faxed back signed verbal orders to [facility] @ [phone] to continue reg ST appt for one more week. DC the following week. It states that she has seemed to have resolved swallowing difficulty.

## 2020-08-23 NOTE — Telephone Encounter (Signed)
Faxed signed order number [id number] to [facility] @ [phone] on 08/20/20 ST effective 08/15/20 [id number], [id number]

## 2020-09-09 ENCOUNTER — Other Ambulatory Visit: Payer: Self-pay | Admitting: Cardiovascular Disease

## 2020-09-09 ENCOUNTER — Telehealth: Payer: Self-pay | Admitting: Cardiovascular Disease

## 2020-09-09 NOTE — Telephone Encounter (Signed)
Pt c/o swelling: STAT is pt has developed SOB within 24 hours  1) How much weight have you gained and in what time span?  Lost 5 lbs in a few days   2) If swelling, where is the swelling located? Legs and abdomen   3) Are you currently taking a fluid pill? yes  4) Are you currently SOB? Not really sometimes a productive cough   5) Do you have a log of your daily weights (if so, list)? Yes a few times a week   6) Have you gained 3 pounds in a day or 5 pounds in a week? Lost 5 lbs in a few days    7) Have you traveled recently? No   C/o incontinence frequent urination    Patient wants to discuss medications and continued swelling

## 2020-09-10 NOTE — Telephone Encounter (Signed)
Left message for patient to call back  

## 2020-09-10 NOTE — Telephone Encounter (Signed)
Spoke with the patient who states that she has had increased swelling in her legs and abdomen. She denies any shortness of breath. She is taking Lasix 40 mg daily. She has not gained any weight, states that she has actually lost some weight. She was at 140lbs and this morning she was 137lbs. She has not increased her sodium intake. She has been keeping a log of her blood pressures but doesn't have them with her currently. She states that she is elevating her legs as often as she can. She was previously using pumps for her lymphedema but has not been able to use them for several months now. Patient has her 6 month FU appointment with Dr. Rockey Situ on 5/24.

## 2020-09-13 NOTE — Telephone Encounter (Signed)
Was able to reach out to Mrs. Masella's, Dr. Rockey Situ inquiring her lymphedema pumps as she has had recent swelling, she advised based on her chronic conditions she has not been able to place her  lymphedema pumps. She has a home aide/caregiver who comes "around the clock", when question about the aide placing the pump, Mrs. Carrara could not give clear reason as to why the aide could place them for her during the visit. Asked about her son who is also in and out the house and Mrs. Thomposn stated "he is too busy with his own stuff and the farm to be able to do it". It appears Mrs. Kohan does have help to get this pumps placed, however, it could be Mrs . Rafferty does not want to wear them.   Mrs. Soria has a follow-up appt tomorrow with Dr. Rockey Situ 5/24 at 2pm, will discuss further regarding her medications, fluid build-up, edema, and her lymphedema pumps.

## 2020-09-14 ENCOUNTER — Encounter: Payer: Self-pay | Admitting: Cardiovascular Disease

## 2020-09-14 ENCOUNTER — Ambulatory Visit: Payer: Medicare HMO | Admitting: Cardiovascular Disease

## 2020-09-14 ENCOUNTER — Other Ambulatory Visit: Payer: Self-pay

## 2020-09-14 VITALS — BP 110/70 | HR 92 | Ht 63.0 in | Wt 140.2 lb

## 2020-09-14 DIAGNOSIS — I5032 Chronic diastolic (congestive) heart failure: Secondary | ICD-10-CM

## 2020-09-14 DIAGNOSIS — E1149 Type 2 diabetes mellitus with other diabetic neurological complication: Secondary | ICD-10-CM

## 2020-09-14 DIAGNOSIS — I2510 Atherosclerotic heart disease of native coronary artery without angina pectoris: Secondary | ICD-10-CM

## 2020-09-14 DIAGNOSIS — I272 Pulmonary hypertension, unspecified: Secondary | ICD-10-CM

## 2020-09-14 DIAGNOSIS — I700 Atherosclerosis of aorta: Secondary | ICD-10-CM

## 2020-09-14 DIAGNOSIS — R079 Chest pain, unspecified: Secondary | ICD-10-CM

## 2020-09-14 DIAGNOSIS — I4819 Other persistent atrial fibrillation: Secondary | ICD-10-CM | POA: Diagnosis not present

## 2020-09-14 DIAGNOSIS — I10 Essential (primary) hypertension: Secondary | ICD-10-CM

## 2020-09-14 NOTE — Progress Notes (Signed)
I Cardiology Office Note  Date:  09/14/2020   ID:  Jodi Reeves, DOB 23-May-1927, MRN [id number]  PCP:  [name], Nino Glow, MD   Chief Complaint  Patient presents with  . 6 month follow up     Patient c/o LE edema and chest tightness at times. Medications reviewed by the patient verbally.     HPI:  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  CT chest 04/20/2016, Calcifications of the aortic arch, mild calcification of the coronary arteries. Moderate calcifications of the abdominal aorta, which is tortuous. Previous stretching of esophagus with Dr. Candace Cruise Chronic leg swelling, venous/lymphedema She presents for follow-up of her atrial fibrillation and chest pain, leg swelling  LOV 02/2020 Presents today in a wheelchair Seen in the hospital March 2022 closed compression fracture of L1  Weight down 30 pounds from 2021 Eating less, overall poor appetite  On lasix 40 daily, followed by polyuria  Previously treated with unna wraps NOT Using lymphedema pumps  Various reasons she cannot use the lymphedema compression pumps,, " cannot put them on" Has a caretaker with her most of the day, also help from her son  Does little bit of walking around the house No chest pain, chronic shortness of breath with exertion  Overactive bladder Changes how she takes the Lasix  Chronic back pain, hips, hx of cortisone Various pains through her torso Followed by Dr. Rudene Christians, has tramadol (side effects , confused)  Labs  HBA1C 6.9 Total chol 171, LDL 87 CR 0.65, BUN 16  echo 10/2018 RVSP 47 mm Hg, EF >60% HBA1C 7.4 CR 0.79  EKG personally reviewed by myself on todays visit Atrial fibrillation rate 91  Previous  falls , Went to [facility], , Sacral fx Left face  bruising, severe, Xarelto held Did not qualify for placement/SNF   CT scan chest March 18, 2017 with cardiomegaly and coronary artery calcification, aortic atherosclerosis  Chronic GI issues Previously had diarrhea, now with constipation. Working with GI  CT chest 04/20/2016: Calcifications of the aortic arch. mild calcification of the coronary arteries. Moderate calcifications of the abdominal aorta, which is tortuous. The origins of the celiac, SMA, single bilateral renal arteries are patent. The origin of the IMA is not seen, and may be occluded chronically.  TIA in January 2017, went to [facility] for evaluation She was talking with a friend, had word finding difficulty, friend was finishing her sentences Workup at [facility] reviewed She had CT scan of her head which was essentially normal,  carotid ultrasound that showed mild bilateral disease, no significant stenoses,  echocardiogram with ejection fraction greater than 65%, mild valve abnormality   PMH:   has a past medical history of Allergy, Anxiety, Balance disorder, Chronic diastolic CHF (congestive heart failure) ([place]), Colon polyps, Coronary artery disease, non-occlusive, DM2 (diabetes mellitus, type 2) ([place]), Fall, Falls frequently, GERD (gastroesophageal reflux disease), Granuloma annulare, History of chicken pox, History of eating disorder, HLD (hyperlipidemia), HTN (hypertension), Hypothyroidism, IBS (irritable bowel syndrome), Incontinence of bowel, Osteopenia, Osteoporosis, Ovarian cancer ([place]), Persistent atrial fibrillation ([place]), SBO (small bowel obstruction) ([place]), TIA (transient ischemic attack), UTI (urinary tract infection), Venous insufficiency, and Ventral hernia.  PSH:    Past Surgical History:  Procedure Laterality Date  . 2nd look laparotomy  1982  . ABDOMINAL HYSTERECTOMY  for ovarian cancer s/p hysterectomy total 1976 and exp lap 1980  . APPENDECTOMY    . BACK SURGERY    . CARDIAC CATHETERIZATION  8/05   neg;  a fib found   . CARPAL TUNNEL RELEASE Left 03/06/2018   Procedure: CARPAL TUNNEL RELEASE ENDOSCOPIC;  Surgeon: Corky Mull, MD;  Location: [facility];  Service: Orthopedics;  Laterality: Left;  diabetic - oral meds  . CARPAL TUNNEL RELEASE     left CTS Dr.Poggi  . cataract OD  2002  . cataract surgery  5/07   R  . CHOLECYSTECTOMY  1986  . DEXA  9/04 and 1/02  . EGD/dilation/colon  1/06  . ESOPHAGOGASTRODUODENOSCOPY (EGD) WITH PROPOFOL N/A 05/16/2016   Procedure: ESOPHAGOGASTRODUODENOSCOPY (EGD) WITH PROPOFOL with dilation;  Surgeon: Lucilla Lame, MD;  Location: [facility];  Service: Endoscopy;  Laterality: N/A;  . FEMORAL HERNIA REPAIR  1958   R  . fracture L elbow/wrist  2000  . intussception/obstruction  12/98  . JOINT REPLACEMENT     left shoulder  . kidney stone x2  1991  . KYPHOPLASTY N/A 04/13/2020   Procedure: [id number];  Surgeon: Hessie Knows, MD;  Location: [facility];  Service: Orthopedics;  Laterality: N/A;  . KYPHOPLASTY N/A 05/18/2020   Procedure: L1 KYPHOPLASTY;  Surgeon: Hessie Knows, MD;  Location: [facility];  Service: Orthopedics;  Laterality: N/A;  . KYPHOPLASTY N/A 07/06/2020   Procedure: L4  KYPHOPLASTY;  Surgeon: Hessie Knows, MD;  Location: [facility];  Service: Orthopedics;  Laterality: N/A;  . laminectomy L4-5  1971  . LUMBAR LAMINECTOMY     1970 ruptured disc   . MOUTH SURGERY    . myoview stress  8/05   syncope (-)  . REVERSE SHOULDER ARTHROPLASTY Left 10/06/2014   Procedure: REVERSE SHOULDER ARTHROPLASTY;  Surgeon: Corky Mull, MD;  Location: [facility];  Service: Orthopedics;  Laterality: Left;  . stillbirth  1969  . TOTAL ABDOMINAL HYSTERECTOMY W/ BILATERAL SALPINGOOPHORECTOMY  1980   ovarian cancer  . WRIST FRACTURE SURGERY  11/05   R    Current Outpatient Medications  Medication Sig Dispense Refill  . acetaminophen (TYLENOL) 500 MG tablet Take 2 tablets (1,000 mg total) by mouth every 6 (six) hours as needed for moderate pain. 60  tablet 0  . Ascorbic Acid (VITAMIN C) POWD Take 2,000 mg by mouth daily.    . bisoprolol (ZEBETA) 5 MG tablet Take 5 mg by mouth in the morning and at bedtime.    . Coenzyme Q10 50 MG TABS Take 10 mg by mouth daily.    Marland Kitchen docusate sodium (COLACE) 100 MG capsule Take 100 mg by mouth daily as needed for mild constipation.    Marland Kitchen estradiol (ESTRACE) 0.1 MG/GM vaginal cream APPLY 0.5MG  (PEA SIZED AMOUNT) JUST INSIDE THE VAGINA WITH FINGER-TIP ON MONDAY, WEDNESDAY AND FRIDAY NIGHTS. 42.5 g 0  . furosemide (LASIX) 20 MG tablet Take 1 tablet (20 mg total) by mouth 2 (two) times daily. Has been taking 40mg  QD in AM 60 tablet 1  . glucose blood (ONE TOUCH ULTRA TEST) test strip Use to test blood sugar once daily E11.49 (Patient taking differently: Use to test blood sugar twice daily E11.49) 100 each 1  . levothyroxine (SYNTHROID, LEVOTHROID) 50 MCG tablet Take 50 mcg by mouth daily before breakfast.    . losartan (COZAAR) 25 MG tablet Take 0.5 tablets (12.5 mg total) by mouth daily. TAKE IF BP>110 SBP 45 tablet 0  . Magnesium Oxide 250 MG  TABS Take 250 mg by mouth daily.    . metFORMIN (GLUCOPHAGE-XR) 500 MG 24 hr tablet Take 500 mg by mouth 2 (two) times daily.     . Multiple Vitamins-Minerals (MULTIVITAMIN GUMMIES ADULT PO) Take 1 each by mouth daily.    . Olopatadine HCl 0.2 % SOLN Place 1 drop into both eyes daily as needed (Allergies).    . Omega-3 1000 MG CAPS Take 1,000 mg by mouth 2 (two) times daily.    . Omega-3 1000 MG CAPS Take by mouth daily.    . potassium chloride (KLOR-CON) 10 MEQ tablet Take 10 mEq by mouth 2 (two) times daily.    . Probiotic Product (ALIGN) 4 MG CAPS Take 4 mg by mouth daily.    . psyllium (METAMUCIL) 58.6 % packet Take 1 packet by mouth daily as needed (constipation).    . Skin Protectants, Misc. (DIMETHICONE-ZINC OXIDE) cream Apply topically 2 (two) times daily as needed for dry skin. Please label the bottle for the vaginal area only. 142 g 0  . vitamin B-12  (CYANOCOBALAMIN) 1000 MCG tablet Take 1,000 mcg by mouth daily. W/ Folate and B6 sublingual    . XARELTO 20 MG TABS tablet Take 1 tablet (20 mg total) by mouth daily with supper. 30 tablet 5  . traMADol (ULTRAM) 50 MG tablet Take 1 tablet (50 mg total) by mouth every 6 (six) hours as needed for severe pain. (Patient not taking: Reported on 09/14/2020) 20 tablet 1   No current facility-administered medications for this visit.    Allergies:   Amiodarone, Fosamax [alendronate], Penicillins, Sulfa antibiotics, Actos [pioglitazone], Amaryl [glimepiride], Atorvastatin, Bentyl [dicyclomine hcl], Ciprofloxacin, Codeine, Ezetimibe-simvastatin, Lovastatin, Macrobid [nitrofurantoin macrocrystal], Protonix [pantoprazole sodium], Rosiglitazone maleate, Statins, Victoza [liraglutide], Alendronate sodium, and Bactrim [sulfamethoxazole-trimethoprim]   Social History:  The patient  reports that she has never smoked. She has never used smokeless tobacco. She reports that she does not drink alcohol and does not use drugs.   Family History:   family history includes Arthritis in her brother, brother, and sister; COPD in her brother; Cancer in her brother, brother, and brother; Depression in her brother and sister; Diabetes in her brother, mother, and paternal grandfather; Early death in her brother, brother, and father; Hearing loss in her brother, brother, brother, daughter, sister, and sister; Heart disease in her brother, mother, sister, sister, and sister; Hyperlipidemia in her brother; Hypertension in her daughter and sister; Stroke in her sister.    Review of Systems: Review of Systems  Constitutional: Negative.   HENT: Negative.   Respiratory: Negative.   Cardiovascular: Positive for leg swelling.  Gastrointestinal: Negative.   Musculoskeletal: Negative.        Unsteady gait  Neurological: Negative.   All other systems reviewed and are negative.   PHYSICAL EXAM: VS:  BP 110/70 (BP Location: Left Arm,  Patient Position: Sitting, Cuff Size: Normal)   Pulse 92   Ht 5\' 3"  (1.6 m)   Wt 140 lb 4 oz (63.6 kg)   SpO2 98%   BMI 24.84 kg/m  , BMI Body mass index is 24.84 kg/m. Constitutional:  oriented to person, place, and time. No distress.  HENT:  Head: Grossly normal Eyes:  no discharge. No scleral icterus.  Neck: No JVD, no carotid bruits  Cardiovascular: Irregularly irregular, no murmurs appreciated Significant leg swelling Massive swelling of legs bilaterally to the knees No compression hose on Pulmonary/Chest: Clear to auscultation bilaterally, no wheezes or rales Abdominal: Soft.  no distension.  no  tenderness.  Musculoskeletal: Normal range of motion Neurological:  normal muscle tone. Coordination normal. No atrophy Skin: Skin warm and dry Psychiatric: normal affect, pleasant  Recent Labs: 04/08/2020: ALT 11 04/15/2020: TSH 4.306 05/03/2020: BUN 14; Creatinine, Ser 0.64; Hemoglobin 13.0; Platelets 215; Potassium 4.0; Sodium 137    Lipid Panel Lab Results  Component Value Date   CHOL 171 03/10/2020   HDL 67.50 03/10/2020   LDLCALC 87 03/10/2020   TRIG 79.0 03/10/2020      Wt Readings from Last 3 Encounters:  09/14/20 140 lb 4 oz (63.6 kg)  08/16/20 138 lb 12.8 oz (63 kg)  07/06/20 139 lb 15.9 oz (63.5 kg)     ASSESSMENT AND PLAN:  Pulmonary hypertension Prior Echocardiogram right heart pressures approximately 47 mmHg Weight stable, Stressed importance of staying on her Lasix  Mixed hyperlipidemia  CT scan including mild coronary calcifications, aortic arch calcifications, moderate aortic atherosclerosis in the abdominal aorta  does not want a statin   Lymphedema: Not using her compression pumps, warned her against blistering and skin breakdown As she is unable to put pumps on recommend she have her caretaker or son placed the pumps at least 1 hour a day or more if possible in effort to minimize blistering and ulcerations Previously did not want to come  into the hospital for lymphedema massage given transportation issues  Essential hypertension, benign -  Blood pressure is well controlled on today's visit. No changes made to the medications.  Persistent atrial fibrillation (HCC) Tolerating Xarelto, no recent falls Contributing to her diastolic CHF symptoms Continue Lasix  Chronic diastolic CHF (congestive heart failure) (HCC) Weight stable, chronic leg swelling (venous insuff/lymphedema) On lasix 40 daily, no changes,  BMP ordered today  Diabetes: Followed by endocrine, numbers doing better  Lymphedema Followed by Dr. Ronalee Belts Has not been using the pumps, Recommended it is her decision   Total encounter time more than 35 minutes  Greater than 50% was spent in counseling and coordination of care with the patient   Orders Placed This Encounter  Procedures  . EKG 12-Lead    Signed, Esmond Plants, M.D., Ph.D. 09/14/2020  [place], [place]

## 2020-09-14 NOTE — Progress Notes (Signed)
06/25/2018 3:26 PM   Jodi Reeves 12/29/1927 [id number]  Referring provider: [name], Nino Glow, MD [place],  [place] [address]  Chief Complaint  Patient presents with  . Follow-up    Burning and frequent urination   Urological history: 1. High risk hematuria -non-smoker -CT urogram 12/2017 both adrenal glands appear normal. No urinary tract calculi are identified. No renal parenchymal mass observed. Borderline urinary bladder wall thickening is probably from nondistention. Right greater than left bladder diverticula noted. No abnormal filling defect identified along the urothelium -Cysto with [name] 01/2018 NED -UA 3-10 RBC's   2. rUTI -contributing factors of age, vaginal atrophy, constipation, diarrhea and poor perineal hygiene -documented positive urine cultures over the last year  Multiple species 05/03/2020  Staphylococcus haemolyticus resistant to cipro, clindamycin and oxacillin 04/08/2020  Klebsiella pneumoniae resistant to ampicillin 04/06/2020  3. Frequency -contributing factors of age, vaginal atrophy, diuretics, obesity and lymphedema   HPI: Jodi Reeves is a 87 y.o. female who presents today for symptoms of frequency and dysuria.   She states that she has had several weeks of pain at the urethra.  She states the pain is independent of urination.  She is having urinary frequency, urgency and dribbling which she states has been occurring for several months.  She also been experiencing a right-sided flank pain for the last several months as well.  She has had nausea for several weeks.  She is also had constipation alternating with diarrhea.  She denies any fevers or chills.  UA is positive for greater than 30 WBCs, 3-10 RBCs and many bacteria.  PMH: Past Medical History:  Diagnosis Date  . Allergy   . Anxiety   . Balance disorder   . Chronic diastolic CHF (congestive heart failure) ([place])   . Colon polyps   . Coronary  artery disease, non-occlusive    a. LHC 11/2003: 10% LAD stenosis, 20% LCx stenosis, and 40% RCA stenosis; b. nuc stress test 12/15: small region of mild ischemia in the apical region with WMA also noted in the apical region, EF 60%. She declined invasive evaluation at that time  . DM2 (diabetes mellitus, type 2) ([place])   . Fall    04/2017 see care[facility]where [facility] multiple fractures, brusing   . Falls frequently    h/o left shoudler/wrist fracture and left fingers numb  . GERD (gastroesophageal reflux disease)    esophageal web  . 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 History of chicken pox   . History of eating disorder   . HLD (hyperlipidemia)   . HTN (hypertension)   . Hypothyroidism   . IBS (irritable bowel syndrome)    diarrhea   . Incontinence of bowel   . Osteopenia   . Osteoporosis   . Ovarian cancer ([place])    1980  . Persistent atrial fibrillation ([place])    a. CHADS2VASc = > 8 (CHF, HTN, age x 2, DM, TIA x 2, female); b. on Xarelto  . SBO (small bowel obstruction) ([place])    1998  . TIA (transient ischemic attack)   . UTI (urinary tract infection)   . Venous insufficiency   . Ventral hernia     Surgical History: Past Surgical History:  Procedure Laterality Date  . 2nd look laparotomy  1982  . ABDOMINAL HYSTERECTOMY     for ovarian cancer s/p hysterectomy total 1976 and exp lap 1980  . APPENDECTOMY    . BACK SURGERY    .  CARDIAC CATHETERIZATION  8/05   neg; a fib found   . CARPAL TUNNEL RELEASE Left 03/06/2018   Procedure: CARPAL TUNNEL RELEASE ENDOSCOPIC;  Surgeon: Corky Mull, MD;  Location: [facility];  Service: Orthopedics;  Laterality: Left;  diabetic - oral meds  . CARPAL TUNNEL RELEASE     left CTS Dr.Poggi  . cataract OD  2002  . cataract surgery  5/07   R  . CHOLECYSTECTOMY  1986  . DEXA  9/04 and 1/02  . EGD/dilation/colon  1/06  . ESOPHAGOGASTRODUODENOSCOPY (EGD) WITH PROPOFOL N/A 05/16/2016   Procedure: ESOPHAGOGASTRODUODENOSCOPY  (EGD) WITH PROPOFOL with dilation;  Surgeon: Lucilla Lame, MD;  Location: [facility];  Service: Endoscopy;  Laterality: N/A;  . FEMORAL HERNIA REPAIR  1958   R  . fracture L elbow/wrist  2000  . intussception/obstruction  12/98  . JOINT REPLACEMENT     left shoulder  . kidney stone x2  1991  . KYPHOPLASTY N/A 04/13/2020   Procedure: [id number];  Surgeon: Hessie Knows, MD;  Location: [facility];  Service: Orthopedics;  Laterality: N/A;  . KYPHOPLASTY N/A 05/18/2020   Procedure: L1 KYPHOPLASTY;  Surgeon: Hessie Knows, MD;  Location: [facility];  Service: Orthopedics;  Laterality: N/A;  . KYPHOPLASTY N/A 07/06/2020   Procedure: L4  KYPHOPLASTY;  Surgeon: Hessie Knows, MD;  Location: [facility];  Service: Orthopedics;  Laterality: N/A;  . laminectomy L4-5  1971  . LUMBAR LAMINECTOMY     1970 ruptured disc   . MOUTH SURGERY    . myoview stress  8/05   syncope (-)  . REVERSE SHOULDER ARTHROPLASTY Left 10/06/2014   Procedure: REVERSE SHOULDER ARTHROPLASTY;  Surgeon: Corky Mull, MD;  Location: [facility];  Service: Orthopedics;  Laterality: Left;  . stillbirth  1969  . TOTAL ABDOMINAL HYSTERECTOMY W/ BILATERAL SALPINGOOPHORECTOMY  1980   ovarian cancer  . WRIST FRACTURE SURGERY  11/05   R    Home Medications:  Allergies as of 09/15/2020      Reactions   Amiodarone Other (See Comments)   Severe Thyroid issues    Fosamax [alendronate]    dysphagia   Penicillins Anaphylaxis, Other (See Comments)   TOLERATED CEFAZOLIN 07/06/20 Has patient had a PCN reaction causing immediate rash, facial/tongue/throat swelling, SOB or lightheadedness with hypotension: Yes Has patient had a PCN reaction causing severe rash involving mucus membranes or skin necrosis: No Has patient had a PCN reaction that required hospitalization: No Has patient had a PCN reaction occurring within the last 10 years: No If all of the above answers are "NO", then may proceed with Cephalosporin use.   Sulfa Antibiotics  Itching, Rash   Itching   Actos [pioglitazone]    Not effective    Amaryl [glimepiride]    Not effective    Atorvastatin Other (See Comments)   Muscle aches & All statins per Patient   Bentyl [dicyclomine Hcl]    Could not tolerate nausea, reduced concentration, h/a   Ciprofloxacin Other (See Comments)   High blood pressure    Codeine Nausea And Vomiting   Ezetimibe-simvastatin Other (See Comments)   Muscle aches, nausea, back pain    Lovastatin Other (See Comments)   Myalgias   Macrobid [nitrofurantoin Macrocrystal]    Severe Itching, rash   Protonix [pantoprazole Sodium]    Esophageal problems  Wants removed from list   Rosiglitazone Maleate Other (See Comments)   Edema   Statins    Muscle and joint aches to all statins  Victoza [liraglutide]    Nausea   Alendronate Sodium Rash   Dysphagia and ulceration    Bactrim [sulfamethoxazole-trimethoprim] Rash   itching      Medication List       Accurate as of Sep 15, 2020  3:26 PM. If you have any questions, ask your nurse or doctor.        acetaminophen 500 MG tablet Commonly known as: TYLENOL Take 2 tablets (1,000 mg total) by mouth every 6 (six) hours as needed for moderate pain.   Align 4 MG Caps Take 4 mg by mouth daily.   bisoprolol 5 MG tablet Commonly known as: ZEBETA Take 5 mg by mouth in the morning and at bedtime.   Coenzyme Q10 50 MG Tabs Take 10 mg by mouth daily.   dimethicone-zinc oxide cream Apply topically 2 (two) times daily as needed for dry skin. Please label the bottle for the vaginal area only.   docusate sodium 100 MG capsule Commonly known as: COLACE Take 100 mg by mouth daily as needed for mild constipation.   estradiol 0.1 MG/GM vaginal cream Commonly known as: ESTRACE APPLY 0.5MG  (PEA SIZED AMOUNT) JUST INSIDE THE VAGINA WITH FINGER-TIP ON Reeves, WEDNESDAY AND FRIDAY NIGHTS.   furosemide 20 MG tablet Commonly known as: LASIX Take 1 tablet (20 mg total) by mouth 2 (two) times  daily. Has been taking 40mg  QD in AM   glucose blood test strip Commonly known as: ONE TOUCH ULTRA TEST Use to test blood sugar once daily E11.49 What changed: additional instructions   levothyroxine 50 MCG tablet Commonly known as: SYNTHROID Take 50 mcg by mouth daily before breakfast.   losartan 25 MG tablet Commonly known as: COZAAR Take 0.5 tablets (12.5 mg total) by mouth daily. TAKE IF BP>110 SBP   Magnesium Oxide 250 MG Tabs Take 250 mg by mouth daily.   metFORMIN 500 MG 24 hr tablet Commonly known as: GLUCOPHAGE-XR Take 500 mg by mouth 2 (two) times daily.   MULTIVITAMIN GUMMIES ADULT PO Take 1 each by mouth daily.   Olopatadine HCl 0.2 % Soln Place 1 drop into both eyes daily as needed (Allergies).   Omega-3 1000 MG Caps Take 1,000 mg by mouth 2 (two) times daily.   Omega-3 1000 MG Caps Take by mouth daily.   potassium chloride 10 MEQ tablet Commonly known as: KLOR-CON Take 10 mEq by mouth 2 (two) times daily.   psyllium 58.6 % packet Commonly known as: METAMUCIL Take 1 packet by mouth daily as needed (constipation).   traMADol 50 MG tablet Commonly known as: Ultram Take 1 tablet (50 mg total) by mouth every 6 (six) hours as needed for severe pain.   vitamin B-12 1000 MCG tablet Commonly known as: CYANOCOBALAMIN Take 1,000 mcg by mouth daily. W/ Folate and B6 sublingual   Vitamin C Powd Take 2,000 mg by mouth daily.   Xarelto 20 MG Tabs tablet Generic drug: rivaroxaban Take 1 tablet (20 mg total) by mouth daily with supper.       Allergies:  Allergies  Allergen Reactions  . Amiodarone Other (See Comments)    Severe Thyroid issues   . Fosamax [Alendronate]     dysphagia  . Penicillins Anaphylaxis and Other (See Comments)    TOLERATED CEFAZOLIN 07/06/20 Has patient had a PCN reaction causing immediate rash, facial/tongue/throat swelling, SOB or lightheadedness with hypotension: Yes Has patient had a PCN reaction causing severe rash involving  mucus membranes or skin necrosis: No Has patient had a  PCN reaction that required hospitalization: No Has patient had a PCN reaction occurring within the last 10 years: No If all of the above answers are "NO", then may proceed with Cephalosporin use.   . Sulfa Antibiotics Itching and Rash    Itching   . Actos [Pioglitazone]     Not effective   . Amaryl [Glimepiride]     Not effective    . Atorvastatin Other (See Comments)    Muscle aches & All statins per Patient  . Bentyl [Dicyclomine Hcl]     Could not tolerate nausea, reduced concentration, h/a   . Ciprofloxacin Other (See Comments)    High blood pressure    . Codeine Nausea And Vomiting  . Ezetimibe-Simvastatin Other (See Comments)    Muscle aches, nausea, back pain   . Lovastatin Other (See Comments)    Myalgias  . Macrobid [Nitrofurantoin Macrocrystal]     Severe Itching, rash   . Protonix [Pantoprazole Sodium]     Esophageal problems  Wants removed from list  . Rosiglitazone Maleate Other (See Comments)    Edema  . Statins     Muscle and joint aches to all statins  . Victoza [Liraglutide]     Nausea   . Alendronate Sodium Rash    Dysphagia and ulceration   . Bactrim [Sulfamethoxazole-Trimethoprim] Rash    itching    Family History: Family History  Problem Relation Age of Onset  . Early death Father   . Diabetes Mother   . Heart disease Mother   . Arthritis Brother   . Cancer Brother   . Depression Brother   . Hearing loss Brother   . Arthritis Brother   . Cancer Brother        colon  . Hearing loss Brother   . Cancer Brother   . Diabetes Brother   . Hearing loss Brother   . Heart disease Brother   . Hyperlipidemia Brother   . Heart disease Sister   . Hypertension Sister   . Stroke Sister   . Hearing loss Daughter   . Hypertension Daughter   . Diabetes Paternal Grandfather   . Hearing loss Sister   . Heart disease Sister   . Arthritis Sister   . Depression Sister   . Hearing loss Sister    . Heart disease Sister   . COPD Brother   . Early death Brother   . Early death Brother     Social History:  reports that she has never smoked. She has never used smokeless tobacco. She reports that she does not drink alcohol and does not use drugs.  ROS: For pertinent review of systems please refer to history of present illness  Physical Exam: BP 131/79   Pulse 83   Ht 5\' 3"  (1.6 m)   Wt 137 lb 3.2 oz (62.2 kg)   BMI 24.30 kg/m   Constitutional:  Well nourished. Alert and oriented, No acute distress. HEENT: [place] AT, mask in place.  Trachea midline Cardiovascular: No clubbing, cyanosis, or edema. Respiratory: Normal respiratory effort, no increased work of breathing. GU: No CVA tenderness.  No bladder fullness or masses.  Atrophic external genitalia, normal pubic hair distribution, no lesions.  Normal urethral meatus, no lesions, no prolapse, no discharge.   No urethral masses, tenderness and/or tenderness.  Bladder is tender to palpation.  Pale vagina mucosa, fair estrogen effect, no discharge, no lesions, fair pelvic support, grade I cystocele and no rectocele noted.  Anus and perineum are without  rashes or lesions.    Skin: No rashes, bruises or suspicious lesions. Lymph: No cervical or inguinal adenopathy. Neurologic: Grossly intact, no focal deficits, moving all 4 extremities. Psychiatric: Normal mood and affect.   Laboratory Data: Urinalysis Component     Latest Ref Rng & Units 09/15/2020  Specific Gravity, UA     1.005 - 1.030 1.010  pH, UA     5.0 - 7.5 5.0  Color, UA     Yellow Straw  Appearance Ur     Clear Cloudy (A)  Leukocytes,UA     Negative 2+ (A)  Protein,UA     Negative/Trace Negative  Glucose, UA     Negative Negative  Ketones, UA     Negative Negative  RBC, UA     Negative 2+ (A)  Bilirubin, UA     Negative Negative  Urobilinogen, Ur     0.2 - 1.0 mg/dL 0.2  Nitrite, UA     Negative Negative  Microscopic Examination      See below:    Component     Latest Ref Rng & Units 09/15/2020          WBC, UA     0 - 5 /hpf >30 (A)  RBC     0 - 2 /hpf 3-10 (A)  Epithelial Cells (non renal)     0 - 10 /hpf 0-10  Bacteria, UA     None seen/Few Many (A)  I have reviewed the labs.  Pertinent Imaging: No recent imaging since last visit   Assessment & Plan:    1. Frequency/Nocturia -continues to experience symptoms due to Lasix and lymphedema -She has significant degenerative joint disease in her spine which may be contributing to some of her urinary symptoms in addition to Lasix  2. rUTI's -Has been evaluated by Infectious Disease and they have recommended starting vaginal estrogen cream, urodynamics, and ensuring patient empties her bladder.  -Urodynamic study deferred due to the patients transportation issues.  -PVRs minimal in the office.  -She has continued the vaginal estrogen cream (Estrace) -She drinks cranberry juice daily, takes probiotics as well -UA with pyuria micro heme and bladder tender to palpation during exam -Will wait on prescribing antibiotics until urine culture result is available as she has numerous drug allergies  3. Microscopic hematuria -Hematuria work up completed in 01/29/2018 - with no concerning findings on cystoscopy.  -UA 3-10 RBC's -likely secondary to UTI  4. Vaginal burning -continue vaginal estrogen cream three nights weekly -continue zinc oxide cream to use as a skin barrier in the vaginal area.  Advised her to label this bottle for use in the vaginal area only as she is already using zinc oxide for an irritation in the buttocks.                                        Return for pending urine cutlure results .  Laneta Simmers  [facility] [address]  [place], [place] [address] [phone]

## 2020-09-14 NOTE — Patient Instructions (Addendum)
Medication Instructions:  No changes  If you need a refill on your cardiac medications before your next appointment, please call your pharmacy.    Lab work: [employer]   Will call for abnormal results, results will be mailed   Testing/Procedures: No new testing needed   Follow-Up:  . You will need a follow up appointment in 6 months, APP   . Providers on your designated Care Team:   . Murray Hodgkins, NP . Christell Faith, PA-C . Marrianne Mood, PA-C   COVID-19 Vaccine Information can be found at: ShippingScam.co.uk For questions related to vaccine distribution or appointments, please email [email or web address] or call [phone].

## 2020-09-15 ENCOUNTER — Encounter: Payer: Self-pay | Admitting: Urology

## 2020-09-15 ENCOUNTER — Ambulatory Visit: Payer: Medicare HMO | Admitting: Urology

## 2020-09-15 VITALS — BP 131/79 | HR 83 | Ht 63.0 in | Wt 137.2 lb

## 2020-09-15 DIAGNOSIS — R3129 Other microscopic hematuria: Secondary | ICD-10-CM

## 2020-09-15 DIAGNOSIS — N952 Postmenopausal atrophic vaginitis: Secondary | ICD-10-CM

## 2020-09-15 DIAGNOSIS — N390 Urinary tract infection, site not specified: Secondary | ICD-10-CM | POA: Diagnosis not present

## 2020-09-15 LAB — BASIC METABOLIC PANEL
BUN/Creatinine Ratio: 26 (ref 12–28)
BUN: 17 mg/dL (ref 10–36)
CO2: 21 mmol/L (ref 20–29)
Calcium: 9.3 mg/dL (ref 8.7–10.3)
Chloride: 100 mmol/L (ref 96–106)
Creatinine, Ser: 0.65 mg/dL (ref 0.57–1.00)
Glucose: 142 mg/dL — ABNORMAL HIGH (ref 65–99)
Potassium: 5 mmol/L (ref 3.5–5.2)
Sodium: 139 mmol/L (ref 134–144)
eGFR: 83 mL/min/{1.73_m2} (ref >59)

## 2020-09-16 LAB — URINALYSIS, COMPLETE
Bilirubin, UA: NEGATIVE
Glucose, UA: NEGATIVE
Ketones, UA: NEGATIVE
Nitrite, UA: NEGATIVE
Protein,UA: NEGATIVE
Specific Gravity, UA: 1.01 (ref 1.005–1.030)
Urobilinogen, Ur: 0.2 mg/dL (ref 0.2–1.0)
pH, UA: 5 (ref 5.0–7.5)

## 2020-09-16 LAB — MICROSCOPIC EXAMINATION: WBC, UA: >30 /hpf — AB (ref 0–5)

## 2020-09-18 LAB — CULTURE, URINE COMPREHENSIVE

## 2020-09-21 ENCOUNTER — Telehealth: Payer: Self-pay | Admitting: Cardiovascular Disease

## 2020-09-21 ENCOUNTER — Telehealth: Payer: Self-pay

## 2020-09-21 MED ORDER — LEVOFLOXACIN 250 MG PO TABS: 250.0000 mg | ORAL_TABLET | Freq: Every day | ORAL | 0 refills | Status: DC

## 2020-09-21 NOTE — Telephone Encounter (Signed)
Patient notified and script sent into pharmacy 

## 2020-09-21 NOTE — Telephone Encounter (Signed)
-----   Message from [name], PA-C sent at 09/21/2020  8:48 AM EDT ----- Please let Jodi Reeves know that her urine culture was positive for infection and we need her to start Levaquin 250 mg daily for seven days.  Do not take with caffeine.

## 2020-09-21 NOTE — Telephone Encounter (Signed)
Patient calling to check the status of lab results

## 2020-09-22 ENCOUNTER — Telehealth: Payer: Self-pay

## 2020-09-22 ENCOUNTER — Telehealth: Payer: Self-pay | Admitting: Urology

## 2020-09-22 NOTE — Telephone Encounter (Signed)
Notified patient as instructed, patient pleased.Appt made

## 2020-09-22 NOTE — Telephone Encounter (Signed)
Reach out to pt's son Jodi Reeves (DPR approved) reviewed pt's lab work, he is thankful for the call, did ask if we could call Jodi Reeves as well. Reports his mother "likes to hear it from the doctor, she don't believe anything I can tell her". Advised would call with the results.   Able to reach pt regarding her recent lab work, Dr. Rockey Situ had a chance to review her results and advised   "Stable BMP  Potassium running little bit high"  Reviewed K+ 5.0, cut off is 5.2, also advised BS slightly elevated.  Jodi Reeves is thankful for the call, stated her BP has also been better, has been under goal of 130s SBP. Advised good to hear, otherwise all her questions and concerns were address and no additional concerns at this time, will call back for anything further.

## 2020-09-22 NOTE — Telephone Encounter (Signed)
We will need her to provide an UA in one month to make sure her micro heme resolves with the treatment of her infection.

## 2020-09-29 ENCOUNTER — Other Ambulatory Visit: Payer: Self-pay

## 2020-09-29 ENCOUNTER — Other Ambulatory Visit: Payer: Medicare HMO

## 2020-10-04 ENCOUNTER — Other Ambulatory Visit: Payer: Self-pay | Admitting: Cardiovascular Disease

## 2020-10-08 ENCOUNTER — Encounter: Payer: Self-pay | Admitting: Emergency Medicine

## 2020-10-08 ENCOUNTER — Emergency Department: Payer: Medicare HMO

## 2020-10-08 ENCOUNTER — Emergency Department
Admission: EM | Admit: 2020-10-08 | Discharge: 2020-10-09 | Disposition: A | Discharge status: Home / Self Care | Payer: Medicare HMO | Source: Home / Self Care | Attending: Emergency Medicine | Admitting: Emergency Medicine

## 2020-10-08 ENCOUNTER — Other Ambulatory Visit: Payer: Self-pay

## 2020-10-08 ENCOUNTER — Telehealth: Payer: Self-pay | Admitting: Cardiovascular Disease

## 2020-10-08 DIAGNOSIS — Z85828 Personal history of other malignant neoplasm of skin: Secondary | ICD-10-CM | POA: Insufficient documentation

## 2020-10-08 DIAGNOSIS — E039 Hypothyroidism, unspecified: Secondary | ICD-10-CM | POA: Insufficient documentation

## 2020-10-08 DIAGNOSIS — R609 Edema, unspecified: Secondary | ICD-10-CM | POA: Insufficient documentation

## 2020-10-08 DIAGNOSIS — Z79899 Other long term (current) drug therapy: Secondary | ICD-10-CM | POA: Insufficient documentation

## 2020-10-08 DIAGNOSIS — R519 Headache, unspecified: Secondary | ICD-10-CM | POA: Insufficient documentation

## 2020-10-08 DIAGNOSIS — R29810 Facial weakness: Secondary | ICD-10-CM

## 2020-10-08 DIAGNOSIS — E1149 Type 2 diabetes mellitus with other diabetic neurological complication: Secondary | ICD-10-CM | POA: Insufficient documentation

## 2020-10-08 DIAGNOSIS — R531 Weakness: Secondary | ICD-10-CM | POA: Insufficient documentation

## 2020-10-08 DIAGNOSIS — Z8543 Personal history of malignant neoplasm of ovary: Secondary | ICD-10-CM | POA: Insufficient documentation

## 2020-10-08 DIAGNOSIS — I4819 Other persistent atrial fibrillation: Secondary | ICD-10-CM | POA: Insufficient documentation

## 2020-10-08 DIAGNOSIS — I110 Hypertensive heart disease with heart failure: Secondary | ICD-10-CM | POA: Insufficient documentation

## 2020-10-08 DIAGNOSIS — R079 Chest pain, unspecified: Secondary | ICD-10-CM

## 2020-10-08 DIAGNOSIS — I5032 Chronic diastolic (congestive) heart failure: Secondary | ICD-10-CM | POA: Insufficient documentation

## 2020-10-08 DIAGNOSIS — E1142 Type 2 diabetes mellitus with diabetic polyneuropathy: Secondary | ICD-10-CM | POA: Insufficient documentation

## 2020-10-08 DIAGNOSIS — S72142A Displaced intertrochanteric fracture of left femur, initial encounter for closed fracture: Secondary | ICD-10-CM | POA: Diagnosis not present

## 2020-10-08 DIAGNOSIS — E11319 Type 2 diabetes mellitus with unspecified diabetic retinopathy without macular edema: Secondary | ICD-10-CM | POA: Insufficient documentation

## 2020-10-08 DIAGNOSIS — Z7901 Long term (current) use of anticoagulants: Secondary | ICD-10-CM | POA: Insufficient documentation

## 2020-10-08 DIAGNOSIS — I2510 Atherosclerotic heart disease of native coronary artery without angina pectoris: Secondary | ICD-10-CM | POA: Insufficient documentation

## 2020-10-08 DIAGNOSIS — S72002A Fracture of unspecified part of neck of left femur, initial encounter for closed fracture: Secondary | ICD-10-CM | POA: Diagnosis not present

## 2020-10-08 DIAGNOSIS — Z96612 Presence of left artificial shoulder joint: Secondary | ICD-10-CM | POA: Insufficient documentation

## 2020-10-08 DIAGNOSIS — R1084 Generalized abdominal pain: Secondary | ICD-10-CM | POA: Insufficient documentation

## 2020-10-08 DIAGNOSIS — Z7984 Long term (current) use of oral hypoglycemic drugs: Secondary | ICD-10-CM | POA: Insufficient documentation

## 2020-10-08 LAB — DIFFERENTIAL
Abs Immature Granulocytes: 0.05 10*3/uL (ref 0.00–0.07)
Basophils Absolute: 0 10*3/uL (ref 0.0–0.1)
Basophils Relative: 1 %
Eosinophils Absolute: 0 10*3/uL (ref 0.0–0.5)
Eosinophils Relative: 1 %
Immature Granulocytes: 1 %
Lymphocytes Relative: 27 %
Lymphs Abs: 1.5 10*3/uL (ref 0.7–4.0)
Monocytes Absolute: 0.5 10*3/uL (ref 0.1–1.0)
Monocytes Relative: 8 %
Neutro Abs: 3.5 10*3/uL (ref 1.7–7.7)
Neutrophils Relative %: 62 %

## 2020-10-08 LAB — COMPREHENSIVE METABOLIC PANEL
ALT: 10 U/L (ref 0–44)
AST: 21 U/L (ref 15–41)
Albumin: 3.9 g/dL (ref 3.5–5.0)
Alkaline Phosphatase: 62 U/L (ref 38–126)
Anion gap: 4 — ABNORMAL LOW (ref 5–15)
BUN: 18 mg/dL (ref 8–23)
CO2: 29 mmol/L (ref 22–32)
Calcium: 8.6 mg/dL — ABNORMAL LOW (ref 8.9–10.3)
Chloride: 101 mmol/L (ref 98–111)
Creatinine, Ser: 0.62 mg/dL (ref 0.44–1.00)
GFR, Estimated: >60 mL/min (ref >60)
Glucose, Bld: 167 mg/dL — ABNORMAL HIGH (ref 70–99)
Potassium: 3.7 mmol/L (ref 3.5–5.1)
Sodium: 134 mmol/L — ABNORMAL LOW (ref 135–145)
Total Bilirubin: 0.6 mg/dL (ref 0.3–1.2)
Total Protein: 7.6 g/dL (ref 6.5–8.1)

## 2020-10-08 LAB — CBC
HCT: 36.6 % (ref 36.0–46.0)
Hemoglobin: 11.9 g/dL — ABNORMAL LOW (ref 12.0–15.0)
MCH: 33.6 pg (ref 26.0–34.0)
MCHC: 32.5 g/dL (ref 30.0–36.0)
MCV: 103.4 fL — ABNORMAL HIGH (ref 80.0–100.0)
Platelets: 196 10*3/uL (ref 150–400)
RBC: 3.54 MIL/uL — ABNORMAL LOW (ref 3.87–5.11)
RDW: 14.3 % (ref 11.5–15.5)
WBC: 5.6 10*3/uL (ref 4.0–10.5)
nRBC: 0 % (ref 0.0–0.2)

## 2020-10-08 NOTE — Telephone Encounter (Signed)
Spoke with Nira Conn RN charge nurse in ED to update patient would be coming in with stroke like symptoms.

## 2020-10-08 NOTE — ED Triage Notes (Signed)
Pt states reports that soon after she woke up yesterday morning. She states that she was unable to open her mouth on the right side. She went to her dentist office at 1130, she states that she also had a headache. She reports that she has lymphoedema in her legs, and they were heavy but she noticed they were heavier than normal and she had to shuffle her legs to get in bed last night. She called PMD and told them what was going on and they told her that they could not get her in until 6/30. She then called Dr. Beverley Fiedler office and talked to [place] and they told her to come here to be evaluated.

## 2020-10-08 NOTE — Telephone Encounter (Signed)
Spoke with patient and noticed she has a delay in her speech. She states yesterday she had a headache, unable to control the side of her mouth, and went to dental appointment. She states that one side of her mouth was not normal. She used walker to walk in and later that night she was not able to pick up her feet and put them in the bed. She is still not able to get her feet off the floor. She did not go to seek medical attention yesterday when this happened. Today she has felt better but she still has mouth drawn and difficulty picking up her feet. Reviewed that based on our conversation she really needs to go to emergency department for evaluation. She did inquire about urgent care but advised that with her symptoms they would also have her go there as well for further assessment and imaging. Again there was a delay in her responses and speech noted. She reports that someone will bring her and advised I would provide them with update she will be coming. She was appreciative for taking her call with no further questions at this time.

## 2020-10-08 NOTE — Telephone Encounter (Signed)
Patient calling to report TIA symptoms yesterday prior to a routine dental appt.   Patient c/o difficulty ambulating , mouth drawn on one side , unable to touch lips together, headache.   Patient denies blurred vision , and slurred speech .   She wants to know if she should seek eval or if she should continue to ignore symptoms.

## 2020-10-09 ENCOUNTER — Emergency Department: Payer: Medicare HMO

## 2020-10-09 LAB — URINALYSIS, COMPLETE (UACMP) WITH MICROSCOPIC
Bilirubin Urine: NEGATIVE
Glucose, UA: NEGATIVE mg/dL
Hgb urine dipstick: NEGATIVE
Ketones, ur: 5 mg/dL — AB
Nitrite: NEGATIVE
Protein, ur: NEGATIVE mg/dL
Specific Gravity, Urine: 1.011 (ref 1.005–1.030)
pH: 8 (ref 5.0–8.0)

## 2020-10-09 LAB — BRAIN NATRIURETIC PEPTIDE: B Natriuretic Peptide: 318.1 pg/mL — ABNORMAL HIGH (ref 0.0–100.0)

## 2020-10-09 LAB — TROPONIN I (HIGH SENSITIVITY): Troponin I (High Sensitivity): 4 ng/L (ref <18)

## 2020-10-09 MED ORDER — ACETAMINOPHEN 500 MG PO TABS
1000.0000 mg | ORAL_TABLET | Freq: Once | ORAL | Status: AC
  Administered 2020-10-09: 1000 mg via ORAL
  Filled 2020-10-09: qty 2

## 2020-10-09 NOTE — ED Provider Notes (Signed)
[facility] Emergency Department Provider Note  ____________________________________________   Event Date/Time   First MD Initiated Contact with Patient 10/09/20 0000     (approximate)  I have reviewed the triage vital signs and the nursing notes.   HISTORY  Chief Complaint Facial Droop    HPI Jodi Reeves is a 87 y.o. female with history of atrial fibrillation on Xarelto, CHF, hypertension, hyperlipidemia, diabetes, previous TIA who presents to the emergency department with her son for concerns for right-sided facial droop.  States she woke up with symptoms Thursday the 16th.  She states symptoms have almost completely resolved.  She states that she felt like she was having hard time smiling and putting on lip balm.  Her son states that he thinks she has the beginning signs of dementia.  He reports that he has a caregiver with her every day and he saw her that night.  Son did not notice a facial droop and caregiver did not report seeing a facial droop to the son.  Patient also feels like both of her legs are weak.  No numbness, tingling.  Also complaining of some left-sided headache.  No head injury.  Also complaining of intermittent right-sided chest pain which he states has been ongoing for weeks.  She is unable to describe this further.  No shortness of breath.  Also complains of diffuse abdominal pain that she has had for years that is unchanged.  No fevers, vomiting or diarrhea.  Has chronic bilateral lower extremity lymphedema.  States her legs have been swollen without change for the past 1 to 2 years.  She is on Lasix.  Patient lives at home alone.  Son reports she uses a walker to ambulate and was able to use her walker to walk tonight.  Son reports she just finished Levaquin for a UTI recently.        Past Medical History:  Diagnosis Date   Allergy    Anxiety    Balance disorder    Chronic diastolic CHF (congestive heart failure) (HCC)    Colon  polyps    Coronary artery disease, non-occlusive    a. LHC 11/2003: 10% LAD stenosis, 20% LCx stenosis, and 40% RCA stenosis; b. nuc stress test 12/15: small region of mild ischemia in the apical region with WMA also noted in the apical region, EF 60%. 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 since 2010   History of chicken pox    History of eating disorder    HLD (hyperlipidemia)    HTN (hypertension)    Hypothyroidism    IBS (irritable bowel syndrome)    diarrhea    Incontinence of bowel    Osteopenia    Osteoporosis    Ovarian cancer ([place])    1980   Persistent atrial fibrillation ([place])    a. CHADS2VASc = > 8 (CHF, HTN, age x 2, DM, TIA x 2, female); b. on Xarelto   SBO (small bowel obstruction) ([place])    1998   TIA (transient ischemic attack)    UTI (urinary tract infection)    Venous insufficiency    Ventral hernia  Patient Active Problem List   Diagnosis Date Noted   UTI (urinary tract infection) 04/10/2020   Abdominal pain 04/09/2020   Hypertension associated with diabetes ([place]) 03/15/2020   Obesity (BMI 30-39.9) 03/15/2020   Coronary artery disease involving native coronary artery of native heart without angina pectoris 03/15/2020   PAD (peripheral artery disease) ([place]) 08/14/2019   Foot ulcer ([place]) 06/19/2019   Lymphedema 06/09/2019   Chronic venous insufficiency 06/09/2019   Diabetic retinopathy associated with type 2 diabetes mellitus ([place]) 01/20/2019   Low back pain 09/24/2018   Overactive bladder 04/10/2018   History of UTI 04/10/2018   Hematuria 04/10/2018   Diarrhea 04/10/2018   Venous ulcer of leg ([place]) 04/10/2018   Status post endoscopic carpal tunnel release  03/19/2018   History of skin cancer 11/08/2017   Chronic anticoagulation 06/11/2017   Aortic atherosclerosis ([place]) 05/08/2017   Coronary artery disease, non-occlusive 03/29/2017   Persistent atrial fibrillation ([place]) 03/29/2017   Carpal tunnel syndrome 03/28/2017   Cervical radiculitis 03/28/2017   Problems with swallowing and mastication    Esophageal candidiasis ([place])    Stricture and stenosis of esophagus 05/02/2016   TIA (transient ischemic attack) 06/24/2015   Status post reverse total shoulder replacement, left 10/16/2014   Humeral head fracture 10/06/2014   Chest pain 03/27/2014   Shortness of breath 03/27/2014   Chronic diastolic CHF (congestive heart failure) ([place]) 03/27/2014   Advanced directives, counseling/discussion 02/24/2014   Routine general medical examination at a health care facility 02/17/2013   Diabetes, polyneuropathy ([place]) 02/17/2013   Type 2 diabetes mellitus with diabetic polyneuropathy, without long-term current use of insulin ([place]) 02/17/2013   Gait instability 04/11/2012   Edema 11/14/2010   OTHER CONSTIPATION 05/31/2007   VENTRAL HERNIA 03/15/2007   Ovarian cancer ([place]) 11/20/2006   Hypothyroidism 11/20/2006   Type 2 diabetes mellitus with neurological manifestations, controlled ([place]) 11/20/2006   Essential hypertension 11/20/2006   Atrial fibrillation ([place]) 11/20/2006   Osteoporosis 11/20/2006   Hyperlipidemia due to type 2 diabetes mellitus ([place]) 11/20/2006    Past Surgical History:  Procedure Laterality Date   2nd look laparotomy  1982   ABDOMINAL HYSTERECTOMY     for ovarian cancer s/p hysterectomy total 1976 and exp lap 1980   APPENDECTOMY     BACK SURGERY     CARDIAC CATHETERIZATION  8/05   neg; a fib found    CARPAL TUNNEL RELEASE Left 03/06/2018   Procedure: CARPAL TUNNEL RELEASE ENDOSCOPIC;  Surgeon: Corky Mull, MD;  Location: [place];  Service: Orthopedics;  Laterality: Left;  diabetic - oral meds   CARPAL TUNNEL  RELEASE     left CTS Dr.Poggi   cataract OD  2002   cataract surgery  5/07   R   CHOLECYSTECTOMY  1986   DEXA  9/04 and 1/02   EGD/dilation/colon  1/06   ESOPHAGOGASTRODUODENOSCOPY (EGD) WITH PROPOFOL N/A 05/16/2016   Procedure: ESOPHAGOGASTRODUODENOSCOPY (EGD) WITH PROPOFOL with dilation;  Surgeon: Lucilla Lame, MD;  Location: [facility] END[facility];  Service: Endoscopy;  Laterality: N/A;   FEMORAL HERNIA REPAIR  1958   R   fracture L elbow/wrist  2000   intussception/obstruction  12/98   JOINT REPLACEMENT     left shoulder   kidney stone x2  1991   KYPHOPLASTY N/A 04/13/2020   Procedure: [id number];  Surgeon: Hessie Knows, MD;  Location: [facility];  Service: Orthopedics;  Laterality: N/A;   KYPHOPLASTY N/A 05/18/2020   Procedure: L1 KYPHOPLASTY;  Surgeon: Hessie Knows, MD;  Location:  [facility];  Service: Orthopedics;  Laterality: N/A;   KYPHOPLASTY N/A 07/06/2020   Procedure: L4  KYPHOPLASTY;  Surgeon: Hessie Knows, MD;  Location: [facility];  Service: Orthopedics;  Laterality: N/A;   laminectomy L4-5  1971   LUMBAR LAMINECTOMY     1970 ruptured disc    MOUTH SURGERY     myoview stress  8/05   syncope (-)   REVERSE SHOULDER ARTHROPLASTY Left 10/06/2014   Procedure: REVERSE SHOULDER ARTHROPLASTY;  Surgeon: Corky Mull, MD;  Location: [facility];  Service: Orthopedics;  Laterality: Left;   stillbirth  1969   TOTAL ABDOMINAL HYSTERECTOMY W/ BILATERAL SALPINGOOPHORECTOMY  1980   ovarian cancer   WRIST FRACTURE SURGERY  11/05   R    Prior to Admission medications   Medication Sig Start Date End Date Taking? Authorizing Provider  acetaminophen (TYLENOL) 500 MG tablet Take 2 tablets (1,000 mg total) by mouth every 6 (six) hours as needed for moderate pain. 05/03/20 05/03/21  Harvest Dark, MD  Ascorbic Acid (VITAMIN C) POWD Take 2,000 mg by mouth daily.    [provider]  bisoprolol (ZEBETA) 5 MG tablet Take 5 mg by mouth in the morning and at bedtime.    [provider]  Coenzyme Q10 50 MG TABS Take 10 mg by mouth daily.    [provider]  docusate sodium (COLACE) 100 MG capsule Take 100 mg by mouth daily as needed for mild constipation.    [provider]  estradiol (ESTRACE) 0.1 MG/GM vaginal cream APPLY 0.5MG  (PEA SIZED AMOUNT) JUST INSIDE THE VAGINA WITH FINGER-TIP ON MONDAY, WEDNESDAY AND FRIDAY NIGHTS. 07/21/20   Zara Council A, PA-C  furosemide (LASIX) 20 MG tablet Take 1 tablet (20 mg total) by mouth 2 (two) times daily. 10/05/20   Minna Merritts, MD  glucose blood (ONE TOUCH ULTRA TEST) test strip Use to test blood sugar once daily E11.49 Patient taking differently: Use to test blood sugar twice daily E11.49 06/24/15   Bedsole, Amy E, MD  levofloxacin (LEVAQUIN) 250 MG tablet Take 1 tablet (250 mg total) by mouth daily. 09/21/20   Zara Council A, PA-C  levothyroxine (SYNTHROID, LEVOTHROID) 50 MCG tablet Take 50 mcg by mouth daily before breakfast.    [provider]  losartan (COZAAR) 25 MG tablet Take 0.5 tablets (12.5 mg total) by mouth daily. TAKE IF BP>110 SBP 09/09/20   Minna Merritts, MD  Magnesium Oxide 250 MG TABS Take 250 mg by mouth daily.    [provider]  metFORMIN (GLUCOPHAGE-XR) 500 MG 24 hr tablet Take 500 mg by mouth 2 (two) times daily.  06/17/19   [provider]  Multiple Vitamins-Minerals (MULTIVITAMIN GUMMIES ADULT PO) Take 1 each by mouth daily.    [provider]  Olopatadine HCl 0.2 % SOLN Place 1 drop into both eyes daily as needed (Allergies).    [provider]  Omega-3 1000 MG CAPS Take 1,000 mg by mouth 2 (two) times daily.    [provider]  Omega-3 1000 MG CAPS Take by mouth daily. Patient not taking: Reported on 09/15/2020    [provider]  potassium chloride (KLOR-CON) 10 MEQ tablet Take 10 mEq by mouth 2 (two) times daily.    [provider]  Probiotic Product (ALIGN) 4 MG CAPS Take 4 mg by mouth daily.    [provider]  psyllium (METAMUCIL) 58.6 % packet Take 1 packet by mouth daily as needed (constipation).  [provider]  Skin Protectants, Misc. (DIMETHICONE-ZINC OXIDE) cream Apply topically 2 (two) times daily as needed for dry skin. Please label the bottle for the vaginal area only. 06/29/20   Zara Council A, PA-C  traMADol (ULTRAM) 50 MG tablet Take 1 tablet (50 mg total) by mouth every 6 (six) hours as needed for severe pain. Patient not taking: Reported on 09/15/2020 07/06/20   Hessie Knows, MD  vitamin B-12 (CYANOCOBALAMIN) 1000 MCG tablet Take 1,000 mcg by mouth daily. W/ Folate and B6 sublingual    [provider]  XARELTO 20 MG TABS tablet Take 1 tablet (20 mg total) by mouth daily with supper. 06/14/20   Minna Merritts, MD    Allergies Amiodarone, Fosamax [alendronate], Penicillins, Sulfa antibiotics, Actos [pioglitazone], Amaryl [glimepiride], Atorvastatin, Bentyl [dicyclomine hcl], Ciprofloxacin, Codeine, Ezetimibe-simvastatin, Lovastatin, Macrobid [nitrofurantoin macrocrystal], Protonix [pantoprazole sodium], Rosiglitazone maleate, Statins, Victoza [liraglutide], Alendronate sodium, and Bactrim [sulfamethoxazole-trimethoprim]  Family History  Problem Relation Age of Onset   Early death Father    Diabetes Mother    Heart disease Mother    Arthritis Brother    Cancer Brother    Depression Brother    Hearing loss Brother    Arthritis Brother    Cancer Brother        colon   Hearing loss Brother    Cancer Brother    Diabetes Brother    Hearing loss Brother    Heart disease Brother    Hyperlipidemia Brother    Heart disease Sister    Hypertension Sister    Stroke Sister    Hearing loss Daughter    Hypertension Daughter    Diabetes Paternal Grandfather    Hearing loss Sister    Heart disease Sister    Arthritis Sister    Depression Sister    Hearing loss Sister    Heart disease Sister    COPD Brother    Early death Brother    Early death  Brother     Social History Social History   Tobacco Use   Smoking status: Never   Smokeless tobacco: Never  Vaping Use   Vaping Use: Never used  Substance Use Topics   Alcohol use: No    Comment: may have occasional glass of wine   Drug use: No    Review of Systems Constitutional: No fever. Eyes: No visual changes. ENT: No sore throat. Cardiovascular: + Right-sided chest pain. Respiratory: Denies shortness of breath. Gastrointestinal: No nausea, vomiting, diarrhea. Genitourinary: Negative for dysuria. Musculoskeletal: Negative for back pain. Skin: Negative for rash. Neurological: Negative for focal weakness or numbness.  ____________________________________________   PHYSICAL EXAM:  VITAL SIGNS: ED Triage Vitals  Enc Vitals Group     BP 10/08/20 1830 (!) 142/76     Pulse Rate 10/08/20 1830 80     Resp 10/08/20 1830 20     Temp 10/08/20 1830 98.2 F (36.8 C)     Temp Source 10/08/20 1830 Oral     SpO2 10/08/20 1830 99 %     Weight 10/08/20 1833 137 lb 2 oz (62.2 kg)     Height 10/08/20 1833 5\' 3"  (1.6 m)     Head Circumference --      Peak Flow --      Pain Score 10/08/20 1833 6     Pain Loc --      Pain Edu? --      Excl. in GC? --    CONSTITUTIONAL: Alert and oriented x 3 and  responds appropriately to questions.  Elderly, poor historian HEAD: Normocephalic EYES: Conjunctivae clear, pupils appear equal, EOM appear intact ENT: normal nose; moist mucous membranes NECK: Supple, normal ROM CARD: Irregularly irregular and rate controlled; S1 and S2 appreciated; no murmurs, no clicks, no rubs, no gallops RESP: Normal chest excursion without splinting or tachypnea; breath sounds clear and equal bilaterally; no wheezes, no rhonchi, no rales, no hypoxia or respiratory distress, speaking full sentences ABD/GI: Normal bowel sounds; non-distended; soft, non-tender, no rebound, no guarding, no peritoneal signs, no hepatosplenomegaly BACK: The back appears normal EXT:  Normal ROM in all joints; no deformity noted, 3+ pitting edema in bilateral lower extremities which she reports is chronic, extremities warm and well-perfused, no signs of superimposed infection SKIN: Normal color for age and race; warm; no rash on exposed skin NEURO: Moves all extremities equally, cranial nerves II through XII intact, normal speech, strength 5/5 in all 4 extremities, normal sensation diffusely PSYCH: The patient's mood and manner are appropriate.  ____________________________________________   LABS (all labs ordered are listed, but only abnormal results are displayed)  Labs Reviewed  CBC - Abnormal; Notable for the following components:      Result Value   RBC 3.54 (*)    Hemoglobin 11.9 (*)    MCV 103.4 (*)    All other components within normal limits  COMPREHENSIVE METABOLIC PANEL - Abnormal; Notable for the following components:   Sodium 134 (*)    Glucose, Bld 167 (*)    Calcium 8.6 (*)    Anion gap 4 (*)    All other components within normal limits  URINALYSIS, COMPLETE (UACMP) WITH MICROSCOPIC - Abnormal; Notable for the following components:   Color, Urine YELLOW (*)    APPearance CLEAR (*)    Ketones, ur 5 (*)    Leukocytes,Ua SMALL (*)    Bacteria, UA RARE (*)    All other components within normal limits  BRAIN NATRIURETIC PEPTIDE - Abnormal; Notable for the following components:   B Natriuretic Peptide 318.1 (*)    All other components within normal limits  DIFFERENTIAL  CBG MONITORING, ED  CBG MONITORING, ED  TROPONIN I (HIGH SENSITIVITY)   ____________________________________________  EKG   EKG Interpretation  Date/Time:  Friday October 08 2020 18:42:27 EDT Ventricular Rate:  81 PR Interval:    QRS Duration: 74 QT Interval:  368 QTC Calculation: 427 R Axis:   -48 Text Interpretation: Atrial fibrillation Left axis deviation Anterolateral infarct , age undetermined Abnormal ECG Confirmed by Pryor Curia [phone]) on 10/09/2020 1:27:07 AM          ____________________________________________  RADIOLOGY Jessie Foot Erice Ahles, personally viewed and evaluated these images (plain radiographs) as part of my medical decision making, as well as reviewing the written report by the radiologist.  ED MD interpretation: CT head unremarkable.  MRI brain unremarkable.  Chest x-ray clear.  Official radiology report(s): DG Chest 2 View  Result Date: 10/09/2020 CLINICAL DATA:  Right-sided chest pain EXAM: CHEST - 2 VIEW COMPARISON:  04/06/2020 FINDINGS: Cardiac shadow is enlarged. Aortic calcifications are noted. The lungs are well aerated bilaterally without focal infiltrate or effusion. Changes of prior vertebral augmentation are noted and stable. Postsurgical changes in the left shoulder are noted. IMPRESSION: No acute abnormality noted. Electronically Signed   By: Inez Catalina M.D.   On: 10/09/2020 00:52   CT HEAD WO CONTRAST  Result Date: 10/08/2020 CLINICAL DATA:  Right-sided facial weakness upon awakening yesterday, headache EXAM: CT HEAD WITHOUT CONTRAST TECHNIQUE:  Contiguous axial images were obtained from the base of the skull through the vertex without intravenous contrast. COMPARISON:  06/26/2013 FINDINGS: Brain: No acute infarct or hemorrhage. The lateral ventricles and midline structures are unremarkable. No acute extra-axial fluid collections. No mass effect. Vascular: Diffuse atherosclerosis throughout the internal carotid arteries. No hyperdense vessel. Skull: Normal. Negative for fracture or focal lesion. Sinuses/Orbits: No acute finding. Other: None. IMPRESSION: 1. No acute intracranial process. Electronically Signed   By: Randa Ngo M.D.   On: 10/08/2020 19:12   MR BRAIN WO CONTRAST  Result Date: 10/09/2020 CLINICAL DATA:  Headache and right facial weakness EXAM: MRI HEAD WITHOUT CONTRAST TECHNIQUE: Multiplanar, multiecho pulse sequences of the brain and surrounding structures were obtained without intravenous contrast.  COMPARISON:  None. FINDINGS: Brain: No acute infarct, mass effect or extra-axial collection. No acute or chronic hemorrhage. There is multifocal hyperintense T2-weighted signal within the white matter. Generalized volume loss without a clear lobar predilection. The midline structures are normal. Vascular: Major flow voids are preserved. Skull and upper cervical spine: Normal calvarium and skull base. Visualized upper cervical spine and soft tissues are normal. Sinuses/Orbits:No paranasal sinus fluid levels or advanced mucosal thickening. No mastoid or middle ear effusion. Normal orbits. IMPRESSION: 1. No acute intracranial abnormality. 2. Generalized volume loss and findings of chronic small vessel disease. Electronically Signed   By: Ulyses Jarred M.D.   On: 10/09/2020 01:35    ____________________________________________   PROCEDURES  Procedure(s) performed (including Critical Care):  Procedures   ____________________________________________   INITIAL IMPRESSION / ASSESSMENT AND PLAN / ED COURSE  As part of my medical decision making, I reviewed the following data within the [place] History obtained from family, Nursing notes reviewed and incorporated, Labs reviewed , EKG interpreted , Old EKG reviewed, Old chart reviewed, Radiograph reviewed , and Notes from prior ED visits         Patient here with concerns for right-sided facial droop that has now resolved.  Also complains of bilateral lower extremity weakness but son reports she was able to ambulate tonight using her walker which is her baseline.  Differential includes CVA, TIA, intracranial hemorrhage, complex migraine.  Head CT unremarkable today.  No sign of Bell's palsy on exam.  Neurologically intact currently.  No fever, meningismus.  Will give Tylenol for headache.  Will obtain MRI of her brain given she is high risk for a stroke.  She is however on Xarelto and compliant.  Patient also complaining of  intermittent chest pain that seems atypical, right-sided for several weeks.  EKG shows no ischemia.  Will check troponin, BNP, chest x-ray.  Seems atypical for ACS.  Doubt PE, dissection.  States she is not having any chest pain currently.  Also complaining of abdominal pain and lower extremity swelling for years.  Son reports is stable.  Abdominal exam is benign today.  He states that he thinks she has dementia and easily gets distracted and has a hard time sticking to the acute issue of why she is here in the emergency department.  ED PROGRESS  Chest x-ray clear.  Troponin negative.  BNP unremarkable.  Chest pain seems very atypical.  No chest pain currently.  MRI shows no acute abnormality.  Urine shows no infection.  Patient ambulating here with walker without difficulty.  I feel she is safe for discharge home with outpatient PCP follow-up.  Family comfortable with plan.  At this time, I do not feel there is any life-threatening condition present.  I have reviewed, interpreted and discussed all results (EKG, imaging, lab, urine as appropriate) and exam findings with patient/family. I have reviewed nursing notes and appropriate previous records.  I feel the patient is safe to be discharged home without further emergent workup and can continue workup as an outpatient as needed. Discussed usual and customary return precautions. Patient/family verbalize understanding and are comfortable with this plan.  Outpatient follow-up has been provided as needed. All questions have been answered.  ____________________________________________   FINAL CLINICAL IMPRESSION(S) / ED DIAGNOSES  Final diagnoses:  Facial droop  Generalized weakness  Left-sided headache  Right-sided chest pain     ED Discharge Orders     None       *Please note:  SHANDY CHECO was evaluated in Emergency Department on 10/09/2020 for the symptoms described in the history of present illness. She was evaluated in the context  of the global COVID-19 pandemic, which necessitated consideration that the patient might be at risk for infection with the SARS-CoV-2 virus that causes COVID-19. Institutional protocols and algorithms that pertain to the evaluation of patients at risk for COVID-19 are in a state of rapid change based on information released by regulatory bodies including the CDC and federal and state organizations. These policies and algorithms were followed during the patient's care in the ED.  Some ED evaluations and interventions may be delayed as a result of limited staffing during and the pandemic.*   Note:  This document was prepared using Dragon voice recognition software and may include unintentional dictation errors.    Khriz Liddy, Delice Bison, DO 10/09/20 [phone]

## 2020-10-09 NOTE — Discharge Instructions (Addendum)
Your labs, urine, CT head, chest x-ray, EKG, MRI brain were reassuring today.  We recommend close follow-up with your primary care physician.  Please continue all of your medications as prescribed.  You may take Tylenol 1000 mg every 6 hours as needed for any headache.  Please avoid aspirin, ibuprofen, Aleve given you are on Xarelto.

## 2020-10-10 ENCOUNTER — Emergency Department: Payer: Medicare HMO

## 2020-10-10 ENCOUNTER — Other Ambulatory Visit: Payer: Self-pay

## 2020-10-10 ENCOUNTER — Inpatient Hospital Stay
Admission: EM | Admit: 2020-10-10 | Discharge: 2020-10-20 | DRG: 481 | Disposition: A | Discharge status: Skilled Nursing Facility | Payer: Medicare HMO | Attending: Internal Medicine | Admitting: Internal Medicine

## 2020-10-10 DIAGNOSIS — R000 Tachycardia, unspecified: Secondary | ICD-10-CM | POA: Diagnosis not present

## 2020-10-10 DIAGNOSIS — E039 Hypothyroidism, unspecified: Secondary | ICD-10-CM | POA: Diagnosis not present

## 2020-10-10 DIAGNOSIS — R0789 Other chest pain: Secondary | ICD-10-CM | POA: Diagnosis present

## 2020-10-10 DIAGNOSIS — S0990XA Unspecified injury of head, initial encounter: Secondary | ICD-10-CM

## 2020-10-10 DIAGNOSIS — I5032 Chronic diastolic (congestive) heart failure: Secondary | ICD-10-CM | POA: Diagnosis present

## 2020-10-10 DIAGNOSIS — I110 Hypertensive heart disease with heart failure: Secondary | ICD-10-CM | POA: Diagnosis present

## 2020-10-10 DIAGNOSIS — M810 Age-related osteoporosis without current pathological fracture: Secondary | ICD-10-CM | POA: Diagnosis present

## 2020-10-10 DIAGNOSIS — I4821 Permanent atrial fibrillation: Secondary | ICD-10-CM

## 2020-10-10 DIAGNOSIS — I2510 Atherosclerotic heart disease of native coronary artery without angina pectoris: Secondary | ICD-10-CM | POA: Diagnosis present

## 2020-10-10 DIAGNOSIS — S72142A Displaced intertrochanteric fracture of left femur, initial encounter for closed fracture: Secondary | ICD-10-CM | POA: Diagnosis present

## 2020-10-10 DIAGNOSIS — F05 Delirium due to known physiological condition: Secondary | ICD-10-CM | POA: Diagnosis not present

## 2020-10-10 DIAGNOSIS — I700 Atherosclerosis of aorta: Secondary | ICD-10-CM | POA: Diagnosis present

## 2020-10-10 DIAGNOSIS — D696 Thrombocytopenia, unspecified: Secondary | ICD-10-CM

## 2020-10-10 DIAGNOSIS — D6959 Other secondary thrombocytopenia: Secondary | ICD-10-CM | POA: Diagnosis not present

## 2020-10-10 DIAGNOSIS — Z8349 Family history of other endocrine, nutritional and metabolic diseases: Secondary | ICD-10-CM

## 2020-10-10 DIAGNOSIS — D62 Acute posthemorrhagic anemia: Secondary | ICD-10-CM | POA: Diagnosis not present

## 2020-10-10 DIAGNOSIS — D7589 Other specified diseases of blood and blood-forming organs: Secondary | ICD-10-CM | POA: Diagnosis not present

## 2020-10-10 DIAGNOSIS — Z01810 Encounter for preprocedural cardiovascular examination: Secondary | ICD-10-CM | POA: Diagnosis not present

## 2020-10-10 DIAGNOSIS — I10 Essential (primary) hypertension: Secondary | ICD-10-CM | POA: Diagnosis present

## 2020-10-10 DIAGNOSIS — Z7984 Long term (current) use of oral hypoglycemic drugs: Secondary | ICD-10-CM

## 2020-10-10 DIAGNOSIS — R296 Repeated falls: Secondary | ICD-10-CM | POA: Diagnosis present

## 2020-10-10 DIAGNOSIS — Z8249 Family history of ischemic heart disease and other diseases of the circulatory system: Secondary | ICD-10-CM

## 2020-10-10 DIAGNOSIS — M25422 Effusion, left elbow: Secondary | ICD-10-CM | POA: Diagnosis present

## 2020-10-10 DIAGNOSIS — R29810 Facial weakness: Secondary | ICD-10-CM | POA: Diagnosis present

## 2020-10-10 DIAGNOSIS — Z20822 Contact with and (suspected) exposure to covid-19: Secondary | ICD-10-CM | POA: Diagnosis present

## 2020-10-10 DIAGNOSIS — E785 Hyperlipidemia, unspecified: Secondary | ICD-10-CM | POA: Diagnosis present

## 2020-10-10 DIAGNOSIS — Z8543 Personal history of malignant neoplasm of ovary: Secondary | ICD-10-CM

## 2020-10-10 DIAGNOSIS — I890 Lymphedema, not elsewhere classified: Secondary | ICD-10-CM | POA: Diagnosis present

## 2020-10-10 DIAGNOSIS — S72002A Fracture of unspecified part of neck of left femur, initial encounter for closed fracture: Secondary | ICD-10-CM | POA: Diagnosis present

## 2020-10-10 DIAGNOSIS — W19XXXA Unspecified fall, initial encounter: Secondary | ICD-10-CM

## 2020-10-10 DIAGNOSIS — E1151 Type 2 diabetes mellitus with diabetic peripheral angiopathy without gangrene: Secondary | ICD-10-CM | POA: Diagnosis present

## 2020-10-10 DIAGNOSIS — W010XXA Fall on same level from slipping, tripping and stumbling without subsequent striking against object, initial encounter: Secondary | ICD-10-CM | POA: Diagnosis present

## 2020-10-10 DIAGNOSIS — R519 Headache, unspecified: Secondary | ICD-10-CM | POA: Diagnosis present

## 2020-10-10 DIAGNOSIS — Z7901 Long term (current) use of anticoagulants: Secondary | ICD-10-CM

## 2020-10-10 DIAGNOSIS — Z8261 Family history of arthritis: Secondary | ICD-10-CM

## 2020-10-10 DIAGNOSIS — Z79890 Hormone replacement therapy (postmenopausal): Secondary | ICD-10-CM

## 2020-10-10 DIAGNOSIS — Z9049 Acquired absence of other specified parts of digestive tract: Secondary | ICD-10-CM

## 2020-10-10 DIAGNOSIS — S52122A Displaced fracture of head of left radius, initial encounter for closed fracture: Secondary | ICD-10-CM | POA: Diagnosis present

## 2020-10-10 DIAGNOSIS — Z419 Encounter for procedure for purposes other than remedying health state, unspecified: Secondary | ICD-10-CM

## 2020-10-10 DIAGNOSIS — Z87440 Personal history of urinary (tract) infections: Secondary | ICD-10-CM

## 2020-10-10 DIAGNOSIS — Z85828 Personal history of other malignant neoplasm of skin: Secondary | ICD-10-CM

## 2020-10-10 DIAGNOSIS — E875 Hyperkalemia: Secondary | ICD-10-CM | POA: Diagnosis not present

## 2020-10-10 DIAGNOSIS — Z96612 Presence of left artificial shoulder joint: Secondary | ICD-10-CM | POA: Diagnosis present

## 2020-10-10 DIAGNOSIS — Y92019 Unspecified place in single-family (private) house as the place of occurrence of the external cause: Secondary | ICD-10-CM

## 2020-10-10 DIAGNOSIS — S72009A Fracture of unspecified part of neck of unspecified femur, initial encounter for closed fracture: Secondary | ICD-10-CM

## 2020-10-10 DIAGNOSIS — Z880 Allergy status to penicillin: Secondary | ICD-10-CM

## 2020-10-10 DIAGNOSIS — R34 Anuria and oliguria: Secondary | ICD-10-CM | POA: Diagnosis not present

## 2020-10-10 DIAGNOSIS — Z833 Family history of diabetes mellitus: Secondary | ICD-10-CM

## 2020-10-10 DIAGNOSIS — Z882 Allergy status to sulfonamides status: Secondary | ICD-10-CM

## 2020-10-10 DIAGNOSIS — Z825 Family history of asthma and other chronic lower respiratory diseases: Secondary | ICD-10-CM

## 2020-10-10 DIAGNOSIS — F419 Anxiety disorder, unspecified: Secondary | ICD-10-CM | POA: Diagnosis present

## 2020-10-10 DIAGNOSIS — D539 Nutritional anemia, unspecified: Secondary | ICD-10-CM | POA: Diagnosis present

## 2020-10-10 DIAGNOSIS — R1013 Epigastric pain: Secondary | ICD-10-CM | POA: Diagnosis present

## 2020-10-10 DIAGNOSIS — E11319 Type 2 diabetes mellitus with unspecified diabetic retinopathy without macular edema: Secondary | ICD-10-CM | POA: Diagnosis present

## 2020-10-10 DIAGNOSIS — E1142 Type 2 diabetes mellitus with diabetic polyneuropathy: Secondary | ICD-10-CM | POA: Diagnosis present

## 2020-10-10 DIAGNOSIS — Z823 Family history of stroke: Secondary | ICD-10-CM

## 2020-10-10 DIAGNOSIS — Z881 Allergy status to other antibiotic agents status: Secondary | ICD-10-CM

## 2020-10-10 DIAGNOSIS — Z888 Allergy status to other drugs, medicaments and biological substances status: Secondary | ICD-10-CM

## 2020-10-10 DIAGNOSIS — Z79899 Other long term (current) drug therapy: Secondary | ICD-10-CM

## 2020-10-10 DIAGNOSIS — I872 Venous insufficiency (chronic) (peripheral): Secondary | ICD-10-CM | POA: Diagnosis present

## 2020-10-10 DIAGNOSIS — Z8673 Personal history of transient ischemic attack (TIA), and cerebral infarction without residual deficits: Secondary | ICD-10-CM

## 2020-10-10 DIAGNOSIS — Z885 Allergy status to narcotic agent status: Secondary | ICD-10-CM

## 2020-10-10 LAB — BASIC METABOLIC PANEL
Anion gap: 9 (ref 5–15)
BUN: 19 mg/dL (ref 8–23)
CO2: 23 mmol/L (ref 22–32)
Calcium: 8.2 mg/dL — ABNORMAL LOW (ref 8.9–10.3)
Chloride: 106 mmol/L (ref 98–111)
Creatinine, Ser: 0.57 mg/dL (ref 0.44–1.00)
GFR, Estimated: >60 mL/min (ref >60)
Glucose, Bld: 253 mg/dL — ABNORMAL HIGH (ref 70–99)
Potassium: 3.7 mmol/L (ref 3.5–5.1)
Sodium: 138 mmol/L (ref 135–145)

## 2020-10-10 LAB — CBC WITH DIFFERENTIAL/PLATELET
Abs Immature Granulocytes: 0.08 10*3/uL — ABNORMAL HIGH (ref 0.00–0.07)
Basophils Absolute: 0 10*3/uL (ref 0.0–0.1)
Basophils Relative: 0 %
Eosinophils Absolute: 0 10*3/uL (ref 0.0–0.5)
Eosinophils Relative: 0 %
HCT: 33.1 % — ABNORMAL LOW (ref 36.0–46.0)
Hemoglobin: 10.8 g/dL — ABNORMAL LOW (ref 12.0–15.0)
Immature Granulocytes: 1 %
Lymphocytes Relative: 8 %
Lymphs Abs: 0.8 10*3/uL (ref 0.7–4.0)
MCH: 34.1 pg — ABNORMAL HIGH (ref 26.0–34.0)
MCHC: 32.6 g/dL (ref 30.0–36.0)
MCV: 104.4 fL — ABNORMAL HIGH (ref 80.0–100.0)
Monocytes Absolute: 0.5 10*3/uL (ref 0.1–1.0)
Monocytes Relative: 5 %
Neutro Abs: 8.1 10*3/uL — ABNORMAL HIGH (ref 1.7–7.7)
Neutrophils Relative %: 86 %
Platelets: 170 10*3/uL (ref 150–400)
RBC: 3.17 MIL/uL — ABNORMAL LOW (ref 3.87–5.11)
RDW: 14.2 % (ref 11.5–15.5)
WBC: 9.4 10*3/uL (ref 4.0–10.5)
nRBC: 0 % (ref 0.0–0.2)

## 2020-10-10 LAB — CBG MONITORING, ED
Glucose-Capillary: 225 mg/dL — ABNORMAL HIGH (ref 70–99)
Glucose-Capillary: 234 mg/dL — ABNORMAL HIGH (ref 70–99)
Glucose-Capillary: 165 mg/dL — ABNORMAL HIGH (ref 70–99)
Glucose-Capillary: 135 mg/dL — ABNORMAL HIGH (ref 70–99)

## 2020-10-10 LAB — RESP PANEL BY RT-PCR (FLU A&B, COVID) ARPGX2
Influenza A by PCR: NEGATIVE
Influenza B by PCR: NEGATIVE
SARS Coronavirus 2 by RT PCR: NEGATIVE

## 2020-10-10 LAB — PROTIME-INR
INR: 2.7 — ABNORMAL HIGH (ref 0.8–1.2)
Prothrombin Time: 29 seconds — ABNORMAL HIGH (ref 11.4–15.2)

## 2020-10-10 LAB — GLUCOSE, CAPILLARY: Glucose-Capillary: 161 mg/dL — ABNORMAL HIGH (ref 70–99)

## 2020-10-10 MED ORDER — TRAZODONE HCL 50 MG PO TABS
25.0000 mg | ORAL_TABLET | Freq: Every evening | ORAL | Status: DC | PRN
  Administered 2020-10-10 – 2020-10-16 (×2): 25 mg via ORAL
  Filled 2020-10-10 (×2): qty 1

## 2020-10-10 MED ORDER — MAGNESIUM OXIDE 400 MG PO TABS
200.0000 mg | ORAL_TABLET | Freq: Every day | ORAL | Status: DC
  Administered 2020-10-10 – 2020-10-20 (×10): 200 mg via ORAL
  Filled 2020-10-10: qty 1
  Filled 2020-10-10 (×3): qty 0.5
  Filled 2020-10-10 (×2): qty 1
  Filled 2020-10-10: qty 0.5
  Filled 2020-10-10: qty 1
  Filled 2020-10-10: qty 0.5
  Filled 2020-10-10 (×2): qty 1
  Filled 2020-10-10: qty 0.5
  Filled 2020-10-10 (×2): qty 1
  Filled 2020-10-10 (×5): qty 0.5

## 2020-10-10 MED ORDER — DOCUSATE SODIUM 100 MG PO CAPS
100.0000 mg | ORAL_CAPSULE | Freq: Every day | ORAL | Status: DC | PRN
  Administered 2020-10-16: 100 mg via ORAL

## 2020-10-10 MED ORDER — FENTANYL CITRATE (PF) 100 MCG/2ML IJ SOLN
50.0000 ug | Freq: Once | INTRAMUSCULAR | Status: AC
  Administered 2020-10-10: 50 ug via INTRAVENOUS
  Filled 2020-10-10: qty 2

## 2020-10-10 MED ORDER — FOLIC ACID 1 MG PO TABS
1.0000 mg | ORAL_TABLET | Freq: Every day | ORAL | Status: DC
  Administered 2020-10-10 – 2020-10-20 (×10): 1 mg via ORAL
  Filled 2020-10-10 (×10): qty 1

## 2020-10-10 MED ORDER — FENTANYL CITRATE (PF) 100 MCG/2ML IJ SOLN
12.5000 ug | INTRAMUSCULAR | Status: DC | PRN
  Administered 2020-10-10 (×2): 25 ug via INTRAVENOUS
  Filled 2020-10-10 (×2): qty 2

## 2020-10-10 MED ORDER — MAGNESIUM HYDROXIDE 400 MG/5ML PO SUSP
30.0000 mL | Freq: Every day | ORAL | Status: DC | PRN
  Administered 2020-10-19: 30 mL via ORAL
  Filled 2020-10-10: qty 30

## 2020-10-10 MED ORDER — DIMETHICONE 1 % EX CREA
TOPICAL_CREAM | Freq: Two times a day (BID) | CUTANEOUS | Status: DC | PRN
  Filled 2020-10-10: qty 113

## 2020-10-10 MED ORDER — ASCORBIC ACID 500 MG PO TABS
2000.0000 mg | ORAL_TABLET | Freq: Every day | ORAL | Status: DC
  Administered 2020-10-10 – 2020-10-20 (×10): 2000 mg via ORAL
  Filled 2020-10-10 (×10): qty 4

## 2020-10-10 MED ORDER — POTASSIUM CHLORIDE CRYS ER 10 MEQ PO TBCR
10.0000 meq | EXTENDED_RELEASE_TABLET | Freq: Two times a day (BID) | ORAL | Status: DC
  Administered 2020-10-10 – 2020-10-17 (×14): 10 meq via ORAL
  Filled 2020-10-10 (×15): qty 1

## 2020-10-10 MED ORDER — ACETAMINOPHEN 650 MG RE SUPP: 650.0000 mg | Freq: Four times a day (QID) | RECTAL | Status: DC | PRN

## 2020-10-10 MED ORDER — BISOPROLOL FUMARATE 5 MG PO TABS
5.0000 mg | ORAL_TABLET | Freq: Every day | ORAL | Status: DC
  Administered 2020-10-11: 5 mg via ORAL
  Filled 2020-10-10 (×3): qty 1

## 2020-10-10 MED ORDER — OMEGA-3-ACID ETHYL ESTERS 1 G PO CAPS
1000.0000 mg | ORAL_CAPSULE | Freq: Two times a day (BID) | ORAL | Status: DC
  Administered 2020-10-10 – 2020-10-20 (×20): 1000 mg via ORAL
  Filled 2020-10-10 (×21): qty 1

## 2020-10-10 MED ORDER — ACETAMINOPHEN 325 MG PO TABS
650.0000 mg | ORAL_TABLET | Freq: Four times a day (QID) | ORAL | Status: DC | PRN
  Administered 2020-10-10 – 2020-10-20 (×10): 650 mg via ORAL
  Filled 2020-10-10 (×10): qty 2

## 2020-10-10 MED ORDER — RISAQUAD PO CAPS
1.0000 | ORAL_CAPSULE | Freq: Every day | ORAL | Status: DC
  Administered 2020-10-10 – 2020-10-20 (×10): 1 via ORAL
  Filled 2020-10-10 (×10): qty 1

## 2020-10-10 MED ORDER — FUROSEMIDE 20 MG PO TABS
20.0000 mg | ORAL_TABLET | Freq: Two times a day (BID) | ORAL | Status: DC
  Administered 2020-10-10 – 2020-10-20 (×18): 20 mg via ORAL
  Filled 2020-10-10 (×18): qty 1

## 2020-10-10 MED ORDER — CHLORHEXIDINE GLUCONATE CLOTH 2 % EX PADS
6.0000 | MEDICATED_PAD | Freq: Every day | CUTANEOUS | Status: DC
  Administered 2020-10-11 – 2020-10-19 (×7): 6 via TOPICAL

## 2020-10-10 MED ORDER — SODIUM CHLORIDE 0.9 % IV SOLN: INTRAVENOUS | Status: DC

## 2020-10-10 MED ORDER — VITAMIN B-12 1000 MCG PO TABS
1000.0000 ug | ORAL_TABLET | Freq: Every day | ORAL | Status: DC
  Administered 2020-10-10 – 2020-10-20 (×10): 1000 ug via ORAL
  Filled 2020-10-10 (×11): qty 1

## 2020-10-10 MED ORDER — LEVOTHYROXINE SODIUM 50 MCG PO TABS
50.0000 ug | ORAL_TABLET | Freq: Every day | ORAL | Status: DC
  Administered 2020-10-10 – 2020-10-20 (×10): 50 ug via ORAL
  Filled 2020-10-10 (×10): qty 1

## 2020-10-10 MED ORDER — OLOPATADINE HCL 0.1 % OP SOLN
1.0000 [drp] | Freq: Two times a day (BID) | OPHTHALMIC | Status: DC | PRN
  Filled 2020-10-10: qty 5

## 2020-10-10 MED ORDER — LINAGLIPTIN 5 MG PO TABS
5.0000 mg | ORAL_TABLET | Freq: Every day | ORAL | Status: DC
  Administered 2020-10-10 – 2020-10-20 (×11): 5 mg via ORAL
  Filled 2020-10-10 (×11): qty 1

## 2020-10-10 MED ORDER — POTASSIUM CHLORIDE 20 MEQ PO PACK
40.0000 meq | PACK | Freq: Once | ORAL | Status: AC
  Administered 2020-10-10: 40 meq via ORAL
  Filled 2020-10-10: qty 2

## 2020-10-10 MED ORDER — ADULT MULTIVITAMIN W/MINERALS CH
ORAL_TABLET | Freq: Every day | ORAL | Status: DC
  Administered 2020-10-10 – 2020-10-20 (×10): 1 via ORAL
  Filled 2020-10-10 (×10): qty 1

## 2020-10-10 MED ORDER — ONDANSETRON HCL 4 MG/2ML IJ SOLN
4.0000 mg | Freq: Once | INTRAMUSCULAR | Status: AC
  Administered 2020-10-10: 4 mg via INTRAVENOUS
  Filled 2020-10-10: qty 2

## 2020-10-10 MED ORDER — ONDANSETRON HCL 4 MG PO TABS: 4.0000 mg | ORAL_TABLET | Freq: Four times a day (QID) | ORAL | Status: DC | PRN

## 2020-10-10 MED ORDER — ONDANSETRON HCL 4 MG/2ML IJ SOLN
4.0000 mg | Freq: Four times a day (QID) | INTRAMUSCULAR | Status: DC | PRN
  Administered 2020-10-12: 4 mg via INTRAVENOUS

## 2020-10-10 MED ORDER — LOSARTAN POTASSIUM 25 MG PO TABS
12.5000 mg | ORAL_TABLET | Freq: Every day | ORAL | Status: DC | PRN
  Filled 2020-10-10: qty 0.5

## 2020-10-10 MED ORDER — VITAMIN B-12 1000 MCG PO TABS: 1000.0000 ug | ORAL_TABLET | Freq: Every day | ORAL | Status: DC

## 2020-10-10 MED ORDER — INSULIN ASPART 100 UNIT/ML IJ SOLN
0.0000 [IU] | Freq: Three times a day (TID) | INTRAMUSCULAR | Status: DC
  Administered 2020-10-10: 5 [IU] via SUBCUTANEOUS
  Administered 2020-10-10 (×2): 3 [IU] via SUBCUTANEOUS
  Administered 2020-10-10: 2 [IU] via SUBCUTANEOUS
  Administered 2020-10-11: 3 [IU] via SUBCUTANEOUS
  Administered 2020-10-11: 5 [IU] via SUBCUTANEOUS
  Administered 2020-10-11 (×2): 3 [IU] via SUBCUTANEOUS
  Administered 2020-10-12 (×2): 5 [IU] via SUBCUTANEOUS
  Administered 2020-10-13: 8 [IU] via SUBCUTANEOUS
  Administered 2020-10-13: 5 [IU] via SUBCUTANEOUS
  Administered 2020-10-13 (×2): 8 [IU] via SUBCUTANEOUS
  Administered 2020-10-14 (×2): 5 [IU] via SUBCUTANEOUS
  Administered 2020-10-14 (×2): 8 [IU] via SUBCUTANEOUS
  Administered 2020-10-15: 5 [IU] via SUBCUTANEOUS
  Administered 2020-10-15: 3 [IU] via SUBCUTANEOUS
  Administered 2020-10-15: 8 [IU] via SUBCUTANEOUS
  Administered 2020-10-15 – 2020-10-16 (×2): 3 [IU] via SUBCUTANEOUS
  Administered 2020-10-16 (×2): 5 [IU] via SUBCUTANEOUS
  Administered 2020-10-17 (×2): 3 [IU] via SUBCUTANEOUS
  Administered 2020-10-17: 2 [IU] via SUBCUTANEOUS
  Administered 2020-10-18 (×2): 3 [IU] via SUBCUTANEOUS
  Administered 2020-10-18: 5 [IU] via SUBCUTANEOUS
  Administered 2020-10-19: 2 [IU] via SUBCUTANEOUS
  Administered 2020-10-19: 5 [IU] via SUBCUTANEOUS
  Administered 2020-10-20: 2 [IU] via SUBCUTANEOUS
  Filled 2020-10-10 (×33): qty 1

## 2020-10-10 MED ORDER — VITAMIN B-6 50 MG PO TABS
50.0000 mg | ORAL_TABLET | Freq: Every day | ORAL | Status: DC
  Administered 2020-10-10 – 2020-10-20 (×10): 50 mg via ORAL
  Filled 2020-10-10 (×11): qty 1

## 2020-10-10 MED ORDER — LABETALOL HCL 5 MG/ML IV SOLN: 20.0000 mg | INTRAVENOUS | Status: DC | PRN

## 2020-10-10 MED ORDER — SODIUM CHLORIDE 0.9 % IV SOLN: INTRAVENOUS | Status: AC

## 2020-10-10 MED ORDER — COENZYME Q10 50 MG PO TABS: 10.0000 mg | ORAL_TABLET | Freq: Every day | ORAL | Status: DC

## 2020-10-10 NOTE — ED Notes (Signed)
Pastor at bedside to talk to pt.

## 2020-10-10 NOTE — H&P (Addendum)
[place]   PATIENT NAME: Jodi Reeves    MR#:  [id number]  DATE OF BIRTH:  03-25-1928  DATE OF ADMISSION:  10/10/2020  PRIMARY CARE PHYSICIAN: [name], Nino Glow, MD   Patient is coming from: Home.   REQUESTING/REFERRING PHYSICIAN: Ward, Delice Bison, DO  CHIEF COMPLAINT:   Chief Complaint  Patient presents with   Hip Pain    Pt tripped and fell. Complains of l hip pain.    HISTORY OF PRESENT ILLNESS:  Jodi Reeves is a 87 y.o. female with medical history significant for multiple medical problems that are mentioned below, including atrial fibrillation with anticoagulation with Xarelto, who presented to the emergency room with acute onset of left hip pain.  The patient was turning off the light switch when she stepped back and lost her balance and fell on the floor.  She denies any presyncope or syncope.  No paresthesias or focal muscle weakness.  No chest pain or palpitations.  No nausea or vomiting or abdominal pain.  No fever or chills.  ED Course: When the patient came to the ER blood pressure was 173/94 with otherwise normal vital signs.  Labs revealed anemia and blood glucose of 253.  Influenza antigens and COVID-19 PCR came back negative.  Blood group is A+ with negative antibody screen.  INR was 2.7 and PTT 29.    EKG as reviewed by me : showed atrial fibrillation with controlled ventricular sponsor of 86 with left axis deviation and T wave inversion in V1 and V2 with low voltage QRS. Imaging: Noncontrasted CT scan revealed: Chronic ischemic microangiopathy and generalized atrophy without acute rec abnormalities.  C-spine CT showed no acute fracture or static subluxation of the cervical spine. Portable chest ray showed no acute cardiopulmonary disease. Left hip x-ray showed comminuted intertrochanteric left hip fracture with varus angulation and impaction. 2 view left elbow x-ray showed minimally displaced intra-articular fracture of the lateral aspect of the  radial head with large joint effusion and prominent dorsal soft tissue swelling.  The patient was given 50 mcg of IV fentanyl twice, 4 mg of IV Zofran and hydration with IV normal saline.  She will be admitted to a medical-surgical bed for further evaluation and management.   PAST MEDICAL HISTORY:   Past Medical History:  Diagnosis Date   Allergy    Anxiety    Balance disorder    Chronic diastolic CHF (congestive heart failure) (HCC)    Colon polyps    Coronary artery disease, non-occlusive    a. LHC 11/2003: 10% LAD stenosis, 20% LCx stenosis, and 40% RCA stenosis; b. nuc stress test 12/15: small region of mild ischemia in the apical region with WMA also noted in the apical region, EF 60%. 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 since 2010   History of chicken pox    History of eating disorder    HLD (hyperlipidemia)    HTN (hypertension)    Hypothyroidism    IBS (irritable bowel syndrome)    diarrhea    Incontinence of bowel    Osteopenia    Osteoporosis    Ovarian cancer ([place])  1980   Persistent atrial fibrillation ([place])    a. CHADS2VASc = > 8 (CHF, HTN, age x 2, DM, TIA x 2, female); b. on Xarelto   SBO (small bowel obstruction) ([place])    1998   TIA (transient ischemic attack)    UTI (urinary tract infection)    Venous insufficiency    Ventral hernia     PAST SURGICAL HISTORY:   Past Surgical History:  Procedure Laterality Date   2nd look laparotomy  1982   ABDOMINAL HYSTERECTOMY     for ovarian cancer s/p hysterectomy total 1976 and exp lap [place]  8/05   neg; a fib found    CARPAL TUNNEL RELEASE Left 03/06/2018    Procedure: CARPAL TUNNEL RELEASE ENDOSCOPIC;  Surgeon: Jodi Mull, MD;  Location: [place];  Service: Orthopedics;  Laterality: Left;  diabetic - oral meds   CARPAL TUNNEL RELEASE     left CTS Dr.Poggi   cataract OD  2002   cataract surgery  5/07   R   CHOLECYSTECTOMY  1986   DEXA  9/04 and 1/02   EGD/dilation/colon  1/06   ESOPHAGOGASTRODUODENOSCOPY (EGD) WITH PROPOFOL N/A 05/16/2016   Procedure: ESOPHAGOGASTRODUODENOSCOPY (EGD) WITH PROPOFOL with dilation;  Surgeon: Jodi Lame, MD;  Location: [facility];  Service: Endoscopy;  Laterality: N/A;   FEMORAL HERNIA REPAIR  1958   R   fracture L elbow/wrist  2000   intussception/obstruction  12/98   JOINT REPLACEMENT     left shoulder   kidney stone x2  1991   KYPHOPLASTY N/A 04/13/2020   Procedure: [id number];  Surgeon: Jodi Knows, MD;  Location: [facility];  Service: Orthopedics;  Laterality: N/A;   KYPHOPLASTY N/A 05/18/2020   Procedure: L1 KYPHOPLASTY;  Surgeon: Jodi Knows, MD;  Location: [facility];  Service: Orthopedics;  Laterality: N/A;   KYPHOPLASTY N/A 07/06/2020   Procedure: L4  KYPHOPLASTY;  Surgeon: Jodi Knows, MD;  Location: [facility];  Service: Orthopedics;  Laterality: N/A;   laminectomy L4-5  1971   LUMBAR LAMINECTOMY     1970 ruptured disc    MOUTH SURGERY     myoview stress  8/05   syncope (-)   REVERSE SHOULDER ARTHROPLASTY Left 10/06/2014   Procedure: REVERSE SHOULDER ARTHROPLASTY;  Surgeon: Jodi Mull, MD;  Location: [facility];  Service: Orthopedics;  Laterality: Left;   stillbirth  1969   TOTAL ABDOMINAL HYSTERECTOMY W/ BILATERAL SALPINGOOPHORECTOMY  1980   ovarian cancer   WRIST FRACTURE SURGERY  11/05   R    SOCIAL HISTORY:   Social History   Tobacco Use   Smoking status: Never   Smokeless tobacco: Never  Substance Use Topics   Alcohol use: No    Comment: may have occasional glass of wine    FAMILY HISTORY:   Family History  Problem Relation Age of Onset   Early  death Father    Diabetes Mother    Heart disease Mother    Arthritis Brother    Cancer Brother    Depression Brother    Hearing loss Brother    Arthritis Brother    Cancer Brother        colon   Hearing loss Brother    Cancer Brother    Diabetes Brother    Hearing loss Brother    Heart disease Brother    Hyperlipidemia Brother    Heart disease Sister  Hypertension Sister    Stroke Sister    Hearing loss Daughter    Hypertension Daughter    Diabetes Paternal Grandfather    Hearing loss Sister    Heart disease Sister    Arthritis Sister    Depression Sister    Hearing loss Sister    Heart disease Sister    COPD Brother    Early death Brother    Early death Brother     DRUG ALLERGIES:   Allergies  Allergen Reactions   Amiodarone Other (See Comments)    Severe Thyroid issues    Fosamax [Alendronate]     dysphagia   Penicillins Anaphylaxis and Other (See Comments)    TOLERATED CEFAZOLIN 07/06/20 Has patient had a PCN reaction causing immediate rash, facial/tongue/throat swelling, SOB or lightheadedness with hypotension: Yes Has patient had a PCN reaction causing severe rash involving mucus membranes or skin necrosis: No Has patient had a PCN reaction that required hospitalization: No Has patient had a PCN reaction occurring within the last 10 years: No If all of the above answers are "NO", then may proceed with Cephalosporin use.    Sulfa Antibiotics Itching and Rash    Itching    Actos [Pioglitazone]     Not effective    Amaryl [Glimepiride]     Not effective     Atorvastatin Other (See Comments)    Muscle aches & All statins per Patient   Bentyl [Dicyclomine Hcl]     Could not tolerate nausea, reduced concentration, h/a    Ciprofloxacin Other (See Comments)    High blood pressure     Codeine Nausea And Vomiting   Ezetimibe-Simvastatin Other (See Comments)    Muscle aches, nausea, back pain    Lovastatin Other (See Comments)    Myalgias   Macrobid  [Nitrofurantoin Macrocrystal]     Severe Itching, rash    Protonix [Pantoprazole Sodium]     Esophageal problems  Wants removed from list   Rosiglitazone Maleate Other (See Comments)    Edema   Statins     Muscle and joint aches to all statins   Victoza [Liraglutide]     Nausea    Alendronate Sodium Rash    Dysphagia and ulceration    Bactrim [Sulfamethoxazole-Trimethoprim] Rash    itching    REVIEW OF SYSTEMS:   ROS As per history of present illness. All pertinent systems were reviewed above. Constitutional, HEENT, cardiovascular, respiratory, GI, GU, musculoskeletal, neuro, psychiatric, endocrine, integumentary and hematologic systems were reviewed and are otherwise negative/unremarkable except for positive findings mentioned above in the HPI.   MEDICATIONS AT HOME:   Prior to Admission medications   Medication Sig Start Date End Date Taking? Authorizing Provider  levofloxacin (LEVAQUIN) 250 MG tablet Take 1 tablet (250 mg total) by mouth daily. 09/21/20  Yes McGowan, Larene Beach A, PA-C  acetaminophen (TYLENOL) 500 MG tablet Take 2 tablets (1,000 mg total) by mouth every 6 (six) hours as needed for moderate pain. 05/03/20 05/03/21  Harvest Dark, MD  Ascorbic Acid (VITAMIN C) POWD Take 2,000 mg by mouth daily.    [provider]  bisoprolol (ZEBETA) 5 MG tablet Take 5 mg by mouth in the morning and at bedtime.    [provider]  Coenzyme Q10 50 MG TABS Take 10 mg by mouth daily.    [provider]  docusate sodium (COLACE) 100 MG capsule Take 100 mg by mouth daily as needed for mild constipation.    [provider]  estradiol (ESTRACE) 0.1 MG/GM vaginal cream APPLY 0.5MG  (PEA SIZED AMOUNT) JUST INSIDE THE VAGINA WITH FINGER-TIP ON MONDAY, WEDNESDAY AND FRIDAY NIGHTS. 07/21/20   Zara Council A, PA-C  furosemide (LASIX) 20 MG tablet Take 1 tablet (20 mg total) by mouth 2 (two) times daily. 10/05/20   Minna Merritts, MD  glucose blood (ONE  TOUCH ULTRA TEST) test strip Use to test blood sugar once daily E11.49 Patient taking differently: Use to test blood sugar twice daily E11.49 06/24/15   Bedsole, Amy E, MD  levothyroxine (SYNTHROID, LEVOTHROID) 50 MCG tablet Take 50 mcg by mouth daily before breakfast.    [provider]  losartan (COZAAR) 25 MG tablet Take 0.5 tablets (12.5 mg total) by mouth daily. TAKE IF BP>110 SBP 09/09/20   Minna Merritts, MD  Magnesium Oxide 250 MG TABS Take 250 mg by mouth daily.    [provider]  metFORMIN (GLUCOPHAGE-XR) 500 MG 24 hr tablet Take 500 mg by mouth 2 (two) times daily.  06/17/19   [provider]  Multiple Vitamins-Minerals (MULTIVITAMIN GUMMIES ADULT PO) Take 1 each by mouth daily.    [provider]  Olopatadine HCl 0.2 % SOLN Place 1 drop into both eyes daily as needed (Allergies).    [provider]  Omega-3 1000 MG CAPS Take 1,000 mg by mouth 2 (two) times daily.    [provider]  potassium chloride (KLOR-CON) 10 MEQ tablet Take 10 mEq by mouth 2 (two) times daily. 09/23/20   [provider]  Probiotic Product (ALIGN) 4 MG CAPS Take 4 mg by mouth daily.    [provider]  psyllium (METAMUCIL) 58.6 % packet Take 1 packet by mouth daily as needed (constipation).    [provider]  Skin Protectants, Misc. (DIMETHICONE-ZINC OXIDE) cream Apply topically 2 (two) times daily as needed for dry skin. Please label the bottle for the vaginal area only. 06/29/20   McGowan, Larene Beach A, PA-C  TRADJENTA 5 MG TABS tablet Take 5 mg by mouth daily. 10/05/20   [provider]  vitamin B-12 (CYANOCOBALAMIN) 1000 MCG tablet Take 1,000 mcg by mouth daily. W/ Folate and B6 sublingual    [provider]  XARELTO 20 MG TABS tablet Take 1 tablet (20 mg total) by mouth daily with supper. 06/14/20   Minna Merritts, MD      VITAL SIGNS:  Blood pressure 125/67, pulse 87, temperature 97.8 F (36.6 C), temperature  source Oral, resp. rate 18, height 5\' 3"  (1.6 m), weight 62.2 kg, SpO2 98 %.  PHYSICAL EXAMINATION:  Physical Exam  GENERAL:  87 y.o.-year-old Caucasian female patient lying in the bed with no acute distress.  EYES: Pupils equal, round, reactive to light and accommodation. No scleral icterus. Extraocular muscles intact.  HEENT: Head atraumatic, normocephalic. Oropharynx and nasopharynx clear.  NECK:  Supple, no jugular venous distention. No thyroid enlargement, no tenderness.  LUNGS: Normal breath sounds bilaterally, no wheezing, rales,rhonchi or crepitation. No use of accessory muscles of respiration.  CARDIOVASCULAR: Regular rate and rhythm, S1, S2 normal. No murmurs, rubs, or gallops.  ABDOMEN: Soft, nondistended, nontender. Bowel sounds present. No organomegaly or mass.  EXTREMITIES:  bilateral lower extremity 2+ pitting and nonpitting edema, cyanosis, or clubbing. Musculoskeletal: Left medial hip tenderness. NEUROLOGIC: Cranial nerves II through XII are intact. Muscle strength 5/5 in all extremities. Sensation intact. Gait not checked.  PSYCHIATRIC: The patient is alert and oriented x 3.  Normal affect and good eye contact. SKIN: No  obvious rash, lesion, or ulcer.   LABORATORY PANEL:   CBC Recent Labs  Lab 10/10/20 0321  WBC 9.4  HGB 10.8*  HCT 33.1*  PLT 170   ------------------------------------------------------------------------------------------------------------------  Chemistries  Recent Labs  Lab 10/08/20 1848 10/10/20 0321  NA 134* 138  K 3.7 3.7  CL 101 106  CO2 29 23  GLUCOSE 167* 253*  BUN 18 19  CREATININE 0.62 0.57  CALCIUM 8.6* 8.2*  AST 21  --   ALT 10  --   ALKPHOS 62  --   BILITOT 0.6  --    ------------------------------------------------------------------------------------------------------------------  Cardiac Enzymes No results for input(s): TROPONINI in the last 168  hours. ------------------------------------------------------------------------------------------------------------------  RADIOLOGY:  DG Chest 2 View  Result Date: 10/09/2020 CLINICAL DATA:  Right-sided chest pain EXAM: CHEST - 2 VIEW COMPARISON:  04/06/2020 FINDINGS: Cardiac shadow is enlarged. Aortic calcifications are noted. The lungs are well aerated bilaterally without focal infiltrate or effusion. Changes of prior vertebral augmentation are noted and stable. Postsurgical changes in the left shoulder are noted. IMPRESSION: No acute abnormality noted. Electronically Signed   By: Inez Catalina M.D.   On: 10/09/2020 00:52   DG Elbow 2 Views Left  Result Date: 10/10/2020 CLINICAL DATA:  Tripped and fell EXAM: LEFT ELBOW - 2 VIEW COMPARISON:  09/24/2014 FINDINGS: Frontal and lateral views of the left elbow demonstrate a minimally displaced intra-articular fracture involving the lateral aspect of the radial head. Large joint effusion. Prominent dorsal soft tissue swelling of the proximal forearm. IMPRESSION: 1. Minimally displaced intra-articular fracture lateral aspect radial head. 2. Large joint effusion. 3. Prominent dorsal soft tissue swelling. Electronically Signed   By: Randa Ngo M.D.   On: 10/10/2020 03:08   CT Head Wo Contrast  Result Date: 10/10/2020 CLINICAL DATA:  Fall EXAM: CT HEAD WITHOUT CONTRAST CT CERVICAL SPINE WITHOUT CONTRAST TECHNIQUE: Multidetector CT imaging of the head and cervical spine was performed following the standard protocol without intravenous contrast. Multiplanar CT image reconstructions of the cervical spine were also generated. COMPARISON:  10/08/2020 FINDINGS: CT HEAD FINDINGS Brain: There is no mass, hemorrhage or extra-axial collection. There is generalized atrophy without lobar predilection. There is hypoattenuation of the periventricular white matter, most commonly indicating chronic ischemic microangiopathy. Vascular: Atherosclerotic calcification of the  vertebral and internal carotid arteries at the skull base. No abnormal hyperdensity of the major intracranial arteries or dural venous sinuses. Skull: The visualized skull base, calvarium and extracranial soft tissues are normal. Sinuses/Orbits: No fluid levels or advanced mucosal thickening of the visualized paranasal sinuses. No mastoid or middle ear effusion. The orbits are normal. CT CERVICAL SPINE FINDINGS Alignment: No static subluxation. Facets are aligned. Occipital condyles are normally positioned. Skull base and vertebrae: No acute fracture. Soft tissues and spinal canal: No prevertebral fluid or swelling. No visible canal hematoma. Disc levels: No advanced spinal canal or neural foraminal stenosis. Multilevel degenerative change. Upper chest: No pneumothorax, pulmonary nodule or pleural effusion. Other: Normal visualized paraspinal cervical soft tissues. IMPRESSION: 1. Chronic ischemic microangiopathy and generalized atrophy without acute intracranial abnormality. 2. No acute fracture or static subluxation of the cervical spine. Electronically Signed   By: Ulyses Jarred M.D.   On: 10/10/2020 03:23   CT HEAD WO CONTRAST  Result Date: 10/08/2020 CLINICAL DATA:  Right-sided facial weakness upon awakening yesterday, headache EXAM: CT HEAD WITHOUT CONTRAST TECHNIQUE: Contiguous axial images were obtained from the base of the skull through the vertex without intravenous contrast. COMPARISON:  06/26/2013 FINDINGS: Brain: No acute  infarct or hemorrhage. The lateral ventricles and midline structures are unremarkable. No acute extra-axial fluid collections. No mass effect. Vascular: Diffuse atherosclerosis throughout the internal carotid arteries. No hyperdense vessel. Skull: Normal. Negative for fracture or focal lesion. Sinuses/Orbits: No acute finding. Other: None. IMPRESSION: 1. No acute intracranial process. Electronically Signed   By: Randa Ngo M.D.   On: 10/08/2020 19:12   CT Cervical Spine Wo  Contrast  Result Date: 10/10/2020 CLINICAL DATA:  Fall EXAM: CT HEAD WITHOUT CONTRAST CT CERVICAL SPINE WITHOUT CONTRAST TECHNIQUE: Multidetector CT imaging of the head and cervical spine was performed following the standard protocol without intravenous contrast. Multiplanar CT image reconstructions of the cervical spine were also generated. COMPARISON:  10/08/2020 FINDINGS: CT HEAD FINDINGS Brain: There is no mass, hemorrhage or extra-axial collection. There is generalized atrophy without lobar predilection. There is hypoattenuation of the periventricular white matter, most commonly indicating chronic ischemic microangiopathy. Vascular: Atherosclerotic calcification of the vertebral and internal carotid arteries at the skull base. No abnormal hyperdensity of the major intracranial arteries or dural venous sinuses. Skull: The visualized skull base, calvarium and extracranial soft tissues are normal. Sinuses/Orbits: No fluid levels or advanced mucosal thickening of the visualized paranasal sinuses. No mastoid or middle ear effusion. The orbits are normal. CT CERVICAL SPINE FINDINGS Alignment: No static subluxation. Facets are aligned. Occipital condyles are normally positioned. Skull base and vertebrae: No acute fracture. Soft tissues and spinal canal: No prevertebral fluid or swelling. No visible canal hematoma. Disc levels: No advanced spinal canal or neural foraminal stenosis. Multilevel degenerative change. Upper chest: No pneumothorax, pulmonary nodule or pleural effusion. Other: Normal visualized paraspinal cervical soft tissues. IMPRESSION: 1. Chronic ischemic microangiopathy and generalized atrophy without acute intracranial abnormality. 2. No acute fracture or static subluxation of the cervical spine. Electronically Signed   By: Ulyses Jarred M.D.   On: 10/10/2020 03:23   MR BRAIN WO CONTRAST  Result Date: 10/09/2020 CLINICAL DATA:  Headache and right facial weakness EXAM: MRI HEAD WITHOUT CONTRAST  TECHNIQUE: Multiplanar, multiecho pulse sequences of the brain and surrounding structures were obtained without intravenous contrast. COMPARISON:  None. FINDINGS: Brain: No acute infarct, mass effect or extra-axial collection. No acute or chronic hemorrhage. There is multifocal hyperintense T2-weighted signal within the white matter. Generalized volume loss without a clear lobar predilection. The midline structures are normal. Vascular: Major flow voids are preserved. Skull and upper cervical spine: Normal calvarium and skull base. Visualized upper cervical spine and soft tissues are normal. Sinuses/Orbits:No paranasal sinus fluid levels or advanced mucosal thickening. No mastoid or middle ear effusion. Normal orbits. IMPRESSION: 1. No acute intracranial abnormality. 2. Generalized volume loss and findings of chronic small vessel disease. Electronically Signed   By: Ulyses Jarred M.D.   On: 10/09/2020 01:35   DG Chest Portable 1 View  Result Date: 10/10/2020 CLINICAL DATA:  [place], left hip fracture EXAM: PORTABLE CHEST 1 VIEW COMPARISON:  10/09/2020 FINDINGS: Single frontal view of the chest demonstrates a stable cardiac silhouette. No acute airspace disease, effusion, or pneumothorax. Prior vertebral augmentations at the thoracolumbar junction. Left shoulder arthroplasty. No acute fractures. IMPRESSION: 1. No acute intrathoracic process. Electronically Signed   By: Randa Ngo M.D.   On: 10/10/2020 03:07   DG Hip Unilat W or Wo Pelvis 2-3 Views Left  Result Date: 10/10/2020 CLINICAL DATA:  Tripped and fell, deformity EXAM: DG HIP (WITH OR WITHOUT PELVIS) 2-3V LEFT COMPARISON:  None. FINDINGS: Frontal view of the pelvis as well as frontal and cross-table  lateral views of left hip are obtained. There is a comminuted intertrochanteric left hip fracture with impaction and varus angulation at the fracture site. No dislocation. Remainder of the bony pelvis is unremarkable. Prior L4 vertebral augmentation.  IMPRESSION: 1. Comminuted intertrochanteric left hip fracture with varus angulation and impaction. Electronically Signed   By: Randa Ngo M.D.   On: 10/10/2020 03:06      IMPRESSION AND PLAN:  Active Problems:   Closed left hip fracture (HCC)  1.  Closed left hip fracture secondary to mechanical fall with associated left radial head fracture. - The patient will be admitted to a surgical bed. - Pain management will be provided. - Orthopedic consultation will be obtained. - Dr. Mack Guise is aware about the patient. - Xarelto will be held off. - Left arm sling will be continued. - The patient has a history of CHF and coronary artery disease. - She has a normal creatinine and history of type 2 diabetes mellitus without insulin therapy with no history of CVA.  She is considered moderate risk for cardiac disease for her age per the revised cardiac risk index.  2.  Essential hypertension. - We will continue Cozaar and Zebeta.  3.  Type 2 diabetes mellitus with diabetic retinopathy.- The patient will be placed on supplemental coverage with NovoLog. - We will hold off metformin and continue Tradjenta  4.  Hypothyroidism. -- We will continue Synthroid.  DVT prophylaxis: SCDs.  Xarelto was held off for planned surgical intervention.  Code Status: full code. Family Communication:  The plan of care was discussed in details with the patient (and family). I answered all questions. The patient agreed to proceed with the above mentioned plan. Further management will depend upon hospital course. Disposition Plan: Back to previous home environment Consults called: Orthopedic consultation.   All the records are reviewed and case discussed with ED provider.  Status is: Inpatient  Remains inpatient appropriate because:Ongoing active pain requiring inpatient pain management, Ongoing diagnostic testing needed not appropriate for outpatient work up, Unsafe d/c plan, IV treatments appropriate due to  intensity of illness or inability to take PO, and Inpatient level of care appropriate due to severity of illness  Dispo: The patient is from: Home              Anticipated d/c is to: Home              Patient currently is not medically stable to d/c.   Difficult to place patient No     TOTAL TIME TAKING CARE OF THIS PATIENT: 55 minutes.    Christel Mormon M.D on 10/10/2020 at 5:11 AM  [facility]   From 7 PM-7 AM, contact night-coverage [email or web address]  CC: Primary care physician; [name], Nino Glow, MD

## 2020-10-10 NOTE — ED Notes (Signed)
Dr. Mack Guise at bedside.

## 2020-10-10 NOTE — ED Notes (Signed)
Pt assisted with lunch. Ate a few bites. States has some dysphagia from years ago. Provided orange sherbert, able to self feed that.

## 2020-10-10 NOTE — ED Notes (Signed)
Pt back form CT

## 2020-10-10 NOTE — Progress Notes (Signed)
PHARMACIST - PHYSICIAN ORDER COMMUNICATION  CONCERNING: P&T Medication Policy on Herbal Medications  DESCRIPTION:  This patient's order for:  Coenzyme Q10 TABS 10 mg  has been noted.  This product(s) is classified as an "herbal" or natural product. Due to a lack of definitive safety studies or FDA approval, nonstandard manufacturing practices, plus the potential risk of unknown drug-drug interactions while on inpatient medications, the Pharmacy and Therapeutics Committee does not permit the use of "herbal" or natural products of this type within [facility]   ACTION TAKEN: The pharmacy department is unable to verify this order at this time.  Please reevaluate patient's clinical condition at discharge and address if the herbal or natural product(s) should be resumed at that time.   Renda Rolls, PharmD, MBA 10/10/2020 6:01 AM

## 2020-10-10 NOTE — Progress Notes (Signed)
PROGRESS NOTE    Jodi Reeves  GGE:[id number] DOB: 1928/02/01 DOA: 10/10/2020 PCP: [name], Nino Glow, MD   Chief complaint.  Hip pain. Brief Narrative:  Jodi Reeves is a 87 y.o. female with medical history significant for multiple medical problems that are mentioned below, including atrial fibrillation with anticoagulation with Xarelto, who presented to the emergency room with acute onset of left hip pain.  The patient was turning off the light switch when she stepped back and lost her balance and fell on the floor.  She sustained left hip fracture.  She has been seen by Dr. Mack Guise, planning for hip surgery.   Assessment & Plan:   Active Problems:   Closed left hip fracture ([place]) #1.  Left hip fracture. Patient has been seen by orthopedics, pending surgery.  Patient has a moderate risk for surgery due to compensated congestive heart failure, coronary artery disease, type 2 diabetes.  No additional work-up is needed at this time.  #2.  Chronic diastolic congestive heart failure. No evidence of acute exacerbation.  I will discontinue IV fluids, restart oral diuretics.  3.  Permanent atrial fibrillation. Hold off anticoagulation for surgery.  4.  Type 2 diabetes. Continue sliding scale insulin.      DVT prophylaxis: SCDs Code Status: Full Family Communication:  Disposition Plan:    Status is: Inpatient  Remains inpatient appropriate because:Inpatient level of care appropriate due to severity of illness  Dispo: The patient is from: Home              Anticipated d/c is to: SNF              Patient currently is not medically stable to d/c.   Difficult to place patient No        No intake/output data recorded. Total I/O In: 900 [I.V.:900] Out: -      Consultants:  Orthopedics.  Procedures: Pending hip surgery.  Antimicrobials: None   Subjective: Patient currently doing well, pain under control. She denies any short of breath or cough. No  chest pain. Pain or nausea vomiting. No fever or chills.   Objective: Vitals:   10/10/20 0355 10/10/20 0802 10/10/20 1006 10/10/20 1119  BP: 125/67 130/75 (!) 102/59 (!) 110/54  Pulse: 87 88 81 73  Resp: 18 14 16 14   Temp:      TempSrc:      SpO2: 98% 98% 96% 99%  Weight:      Height:        Intake/Output Summary (Last 24 hours) at 10/10/2020 1321 Last data filed at 10/10/2020 1234 Gross per 24 hour  Intake 900 ml  Output --  Net 900 ml   Filed Weights   10/10/20 0230  Weight: 62.2 kg    Examination:  General exam: Appears calm and comfortable  Respiratory system: Clear to auscultation. Respiratory effort normal. Cardiovascular system:.  No JVD, murmurs, rubs, gallops or clicks.  Gastrointestinal system: Abdomen is nondistended, soft and nontender. No organomegaly or masses felt. Normal bowel sounds heard. Central nervous system: Alert and oriented. No focal neurological deficits. Extremities: 3+ leg edema Skin: No rashes, lesions or ulcers Psychiatry: Judgement and insight appear normal. Mood & affect appropriate.     Data Reviewed: I have personally reviewed following labs and imaging studies  CBC: Recent Labs  Lab 10/08/20 1848 10/10/20 0321  WBC 5.6 9.4  NEUTROABS 3.5 8.1*  HGB 11.9* 10.8*  HCT 36.6 33.1*  MCV 103.4* 104.4*  PLT 196 170  Basic Metabolic Panel: Recent Labs  Lab 10/08/20 1848 10/10/20 0321  NA 134* 138  K 3.7 3.7  CL 101 106  CO2 29 23  GLUCOSE 167* 253*  BUN 18 19  CREATININE 0.62 0.57  CALCIUM 8.6* 8.2*   GFR: Estimated Creatinine Clearance: 37.9 mL/min (by C-G formula based on SCr of 0.57 mg/dL). Liver Function Tests: Recent Labs  Lab 10/08/20 1848  AST 21  ALT 10  ALKPHOS 62  BILITOT 0.6  PROT 7.6  ALBUMIN 3.9   No results for input(s): LIPASE, AMYLASE in the last 168 hours. No results for input(s): AMMONIA in the last 168 hours. Coagulation Profile: Recent Labs  Lab 10/10/20 0321  INR 2.7*   Cardiac  Enzymes: No results for input(s): CKTOTAL, CKMB, CKMBINDEX, TROPONINI in the last 168 hours. BNP (last 3 results) No results for input(s): PROBNP in the last 8760 hours. HbA1C: No results for input(s): HGBA1C in the last 72 hours. CBG: Recent Labs  Lab 10/10/20 0223 10/10/20 0742 10/10/20 1226  GLUCAP 225* 234* 165*   Lipid Profile: No results for input(s): CHOL, HDL, LDLCALC, TRIG, CHOLHDL, LDLDIRECT in the last 72 hours. Thyroid Function Tests: No results for input(s): TSH, T4TOTAL, FREET4, T3FREE, THYROIDAB in the last 72 hours. Anemia Panel: No results for input(s): VITAMINB12, FOLATE, FERRITIN, TIBC, IRON, RETICCTPCT in the last 72 hours. Sepsis Labs: No results for input(s): PROCALCITON, LATICACIDVEN in the last 168 hours.  Recent Results (from the past 240 hour(s))  Resp Panel by RT-PCR (Flu A&B, Covid) Nasopharyngeal Swab     Status: None   Collection Time: 10/10/20  3:21 AM   Specimen: Nasopharyngeal Swab; Nasopharyngeal(NP) swabs in vial transport medium  Result Value Ref Range Status   SARS Coronavirus 2 by RT PCR NEGATIVE NEGATIVE Final    Comment: (NOTE) SARS-CoV-2 target nucleic acids are NOT DETECTED.  The SARS-CoV-2 RNA is generally detectable in upper respiratory specimens during the acute phase of infection. The lowest concentration of SARS-CoV-2 viral copies this assay can detect is 138 copies/mL. A negative result does not preclude SARS-Cov-2 infection and should not be used as the sole basis for treatment or other patient management decisions. A negative result may occur with  improper specimen collection/handling, submission of specimen other than nasopharyngeal swab, presence of viral mutation(s) within the areas targeted by this assay, and inadequate number of viral copies(<138 copies/mL). A negative result must be combined with clinical observations, patient history, and epidemiological information. The expected result is Negative.  Fact Sheet for  Patients:  EntrepreneurPulse.com.au  Fact Sheet for Healthcare Providers:  IncredibleEmployment.be  This test is no t yet approved or cleared by the [place] FDA and  has been authorized for detection and/or diagnosis of SARS-CoV-2 by FDA under an Emergency Use Authorization (EUA). This EUA will remain  in effect (meaning this test can be used) for the duration of the COVID-19 declaration under Section 564(b)(1) of the Act, 21 U.S.C.section 360bbb-3(b)(1), unless the authorization is terminated  or revoked sooner.       Influenza A by PCR NEGATIVE NEGATIVE Final   Influenza B by PCR NEGATIVE NEGATIVE Final    Comment: (NOTE) The Xpert Xpress SARS-CoV-2/FLU/RSV plus assay is intended as an aid in the diagnosis of influenza from Nasopharyngeal swab specimens and should not be used as a sole basis for treatment. Nasal washings and aspirates are unacceptable for Xpert Xpress SARS-CoV-2/FLU/RSV testing.  Fact Sheet for Patients: EntrepreneurPulse.com.au  Fact Sheet for Healthcare Providers: IncredibleEmployment.be  This test  is not yet approved or cleared by the [place] and has been authorized for detection and/or diagnosis of SARS-CoV-2 by FDA under an Emergency Use Authorization (EUA). This EUA will remain in effect (meaning this test can be used) for the duration of the COVID-19 declaration under Section 564(b)(1) of the Act, 21 U.S.C. section 360bbb-3(b)(1), unless the authorization is terminated or revoked.  Performed at [facility], [address], [place], [place] [address]          Radiology Studies: DG Chest 2 View  Result Date: 10/09/2020 CLINICAL DATA:  Right-sided chest pain EXAM: CHEST - 2 VIEW COMPARISON:  04/06/2020 FINDINGS: Cardiac shadow is enlarged. Aortic calcifications are noted. The lungs are well aerated bilaterally without focal infiltrate or effusion.  Changes of prior vertebral augmentation are noted and stable. Postsurgical changes in the left shoulder are noted. IMPRESSION: No acute abnormality noted. Electronically Signed   By: Inez Catalina M.D.   On: 10/09/2020 00:52   DG Elbow 2 Views Left  Result Date: 10/10/2020 CLINICAL DATA:  Tripped and fell EXAM: LEFT ELBOW - 2 VIEW COMPARISON:  09/24/2014 FINDINGS: Frontal and lateral views of the left elbow demonstrate a minimally displaced intra-articular fracture involving the lateral aspect of the radial head. Large joint effusion. Prominent dorsal soft tissue swelling of the proximal forearm. IMPRESSION: 1. Minimally displaced intra-articular fracture lateral aspect radial head. 2. Large joint effusion. 3. Prominent dorsal soft tissue swelling. Electronically Signed   By: Randa Ngo M.D.   On: 10/10/2020 03:08   CT Head Wo Contrast  Result Date: 10/10/2020 CLINICAL DATA:  Fall EXAM: CT HEAD WITHOUT CONTRAST CT CERVICAL SPINE WITHOUT CONTRAST TECHNIQUE: Multidetector CT imaging of the head and cervical spine was performed following the standard protocol without intravenous contrast. Multiplanar CT image reconstructions of the cervical spine were also generated. COMPARISON:  10/08/2020 FINDINGS: CT HEAD FINDINGS Brain: There is no mass, hemorrhage or extra-axial collection. There is generalized atrophy without lobar predilection. There is hypoattenuation of the periventricular white matter, most commonly indicating chronic ischemic microangiopathy. Vascular: Atherosclerotic calcification of the vertebral and internal carotid arteries at the skull base. No abnormal hyperdensity of the major intracranial arteries or dural venous sinuses. Skull: The visualized skull base, calvarium and extracranial soft tissues are normal. Sinuses/Orbits: No fluid levels or advanced mucosal thickening of the visualized paranasal sinuses. No mastoid or middle ear effusion. The orbits are normal. CT CERVICAL SPINE FINDINGS  Alignment: No static subluxation. Facets are aligned. Occipital condyles are normally positioned. Skull base and vertebrae: No acute fracture. Soft tissues and spinal canal: No prevertebral fluid or swelling. No visible canal hematoma. Disc levels: No advanced spinal canal or neural foraminal stenosis. Multilevel degenerative change. Upper chest: No pneumothorax, pulmonary nodule or pleural effusion. Other: Normal visualized paraspinal cervical soft tissues. IMPRESSION: 1. Chronic ischemic microangiopathy and generalized atrophy without acute intracranial abnormality. 2. No acute fracture or static subluxation of the cervical spine. Electronically Signed   By: Ulyses Jarred M.D.   On: 10/10/2020 03:23   CT HEAD WO CONTRAST  Result Date: 10/08/2020 CLINICAL DATA:  Right-sided facial weakness upon awakening yesterday, headache EXAM: CT HEAD WITHOUT CONTRAST TECHNIQUE: Contiguous axial images were obtained from the base of the skull through the vertex without intravenous contrast. COMPARISON:  06/26/2013 FINDINGS: Brain: No acute infarct or hemorrhage. The lateral ventricles and midline structures are unremarkable. No acute extra-axial fluid collections. No mass effect. Vascular: Diffuse atherosclerosis throughout the internal carotid arteries. No hyperdense vessel. Skull:  Normal. Negative for fracture or focal lesion. Sinuses/Orbits: No acute finding. Other: None. IMPRESSION: 1. No acute intracranial process. Electronically Signed   By: Randa Ngo M.D.   On: 10/08/2020 19:12   CT Cervical Spine Wo Contrast  Result Date: 10/10/2020 CLINICAL DATA:  Fall EXAM: CT HEAD WITHOUT CONTRAST CT CERVICAL SPINE WITHOUT CONTRAST TECHNIQUE: Multidetector CT imaging of the head and cervical spine was performed following the standard protocol without intravenous contrast. Multiplanar CT image reconstructions of the cervical spine were also generated. COMPARISON:  10/08/2020 FINDINGS: CT HEAD FINDINGS Brain: There is no  mass, hemorrhage or extra-axial collection. There is generalized atrophy without lobar predilection. There is hypoattenuation of the periventricular white matter, most commonly indicating chronic ischemic microangiopathy. Vascular: Atherosclerotic calcification of the vertebral and internal carotid arteries at the skull base. No abnormal hyperdensity of the major intracranial arteries or dural venous sinuses. Skull: The visualized skull base, calvarium and extracranial soft tissues are normal. Sinuses/Orbits: No fluid levels or advanced mucosal thickening of the visualized paranasal sinuses. No mastoid or middle ear effusion. The orbits are normal. CT CERVICAL SPINE FINDINGS Alignment: No static subluxation. Facets are aligned. Occipital condyles are normally positioned. Skull base and vertebrae: No acute fracture. Soft tissues and spinal canal: No prevertebral fluid or swelling. No visible canal hematoma. Disc levels: No advanced spinal canal or neural foraminal stenosis. Multilevel degenerative change. Upper chest: No pneumothorax, pulmonary nodule or pleural effusion. Other: Normal visualized paraspinal cervical soft tissues. IMPRESSION: 1. Chronic ischemic microangiopathy and generalized atrophy without acute intracranial abnormality. 2. No acute fracture or static subluxation of the cervical spine. Electronically Signed   By: Ulyses Jarred M.D.   On: 10/10/2020 03:23   MR BRAIN WO CONTRAST  Result Date: 10/09/2020 CLINICAL DATA:  Headache and right facial weakness EXAM: MRI HEAD WITHOUT CONTRAST TECHNIQUE: Multiplanar, multiecho pulse sequences of the brain and surrounding structures were obtained without intravenous contrast. COMPARISON:  None. FINDINGS: Brain: No acute infarct, mass effect or extra-axial collection. No acute or chronic hemorrhage. There is multifocal hyperintense T2-weighted signal within the white matter. Generalized volume loss without a clear lobar predilection. The midline structures  are normal. Vascular: Major flow voids are preserved. Skull and upper cervical spine: Normal calvarium and skull base. Visualized upper cervical spine and soft tissues are normal. Sinuses/Orbits:No paranasal sinus fluid levels or advanced mucosal thickening. No mastoid or middle ear effusion. Normal orbits. IMPRESSION: 1. No acute intracranial abnormality. 2. Generalized volume loss and findings of chronic small vessel disease. Electronically Signed   By: Ulyses Jarred M.D.   On: 10/09/2020 01:35   DG Chest Portable 1 View  Result Date: 10/10/2020 CLINICAL DATA:  [place], left hip fracture EXAM: PORTABLE CHEST 1 VIEW COMPARISON:  10/09/2020 FINDINGS: Single frontal view of the chest demonstrates a stable cardiac silhouette. No acute airspace disease, effusion, or pneumothorax. Prior vertebral augmentations at the thoracolumbar junction. Left shoulder arthroplasty. No acute fractures. IMPRESSION: 1. No acute intrathoracic process. Electronically Signed   By: Randa Ngo M.D.   On: 10/10/2020 03:07   DG Hip Unilat W or Wo Pelvis 2-3 Views Left  Result Date: 10/10/2020 CLINICAL DATA:  Tripped and fell, deformity EXAM: DG HIP (WITH OR WITHOUT PELVIS) 2-3V LEFT COMPARISON:  None. FINDINGS: Frontal view of the pelvis as well as frontal and cross-table lateral views of left hip are obtained. There is a comminuted intertrochanteric left hip fracture with impaction and varus angulation at the fracture site. No dislocation. Remainder of the bony pelvis  is unremarkable. Prior L4 vertebral augmentation. IMPRESSION: 1. Comminuted intertrochanteric left hip fracture with varus angulation and impaction. Electronically Signed   By: Randa Ngo M.D.   On: 10/10/2020 03:06        Scheduled Meds:  acidophilus  1 capsule Oral Daily   ascorbic acid  2,000 mg Oral Daily   bisoprolol  5 mg Oral Daily   vitamin B-12  1,000 mcg Oral Daily   And   folic acid  1 mg Oral Daily   And   vitamin B-6  50 mg Oral Daily    furosemide  20 mg Oral BID   insulin aspart  0-15 Units Subcutaneous TID AC & HS   levothyroxine  50 mcg Oral Q0600   linagliptin  5 mg Oral Daily   magnesium oxide  200 mg Oral Daily   multivitamin with minerals   Oral Daily   omega-3 acid ethyl esters  1,000 mg Oral BID   potassium chloride  10 mEq Oral BID   Continuous Infusions:   LOS: 0 days    Time spent: No charge    Sharen Hones, MD [facility]   To contact the attending provider between 7A-7P or the covering provider during after hours 7P-7A, please log into the web site [email or web address] and access using universal [place] password for that web site. If you do not have the password, please call the hospital operator.  10/10/2020, 1:21 PM

## 2020-10-10 NOTE — ED Notes (Signed)
Pt sitting up eating supper. Son assisting with meal.

## 2020-10-10 NOTE — ED Notes (Signed)
Admitting at bedside 

## 2020-10-10 NOTE — Consult Note (Signed)
Full consult to follow.  87 year old female with a comminuted left intertrochanteric hip fracture s/p fall.   She will need surgical fixation of her fracture but presents on xarelto.  Xarelto will need to be held and surgery will be performed Monday or Tuesday.

## 2020-10-10 NOTE — Consult Note (Signed)
ORTHOPAEDIC CONSULTATION  REQUESTING PHYSICIAN: Sharen Hones, MD  Chief Complaint: Left hip and elbow pain status post fall  HPI: Jodi Reeves is a 87 y.o. female who presents to the emergency room at [facility] after a fall at home.  Patient lives independently and lost her balance while stepping back turning off the light at home.  She landed on her left side injuring her left elbow and hip.  Patient denied syncope or weakness.  Patient was diagnosed with a left intertrochanteric hip fracture as well as a minimally displaced intra-articular fracture of the radial head of the left elbow.  She is being admitted to the hospitalist service and orthopedics is consulted for management of her fractures.  Patient seen this evening in the emergency room where she is situated in the hallway.  Patient denies significant left hip pain when at rest.  Patient is on Xarelto for atrial fibrillation and took her last dose last night she estimates between 8 and 9 PM.  Past Medical History:  Diagnosis Date   Allergy    Anxiety    Balance disorder    Chronic diastolic CHF (congestive heart failure) (HCC)    Colon polyps    Coronary artery disease, non-occlusive    a. LHC 11/2003: 10% LAD stenosis, 20% LCx stenosis, and 40% RCA stenosis; b. nuc stress test 12/15: small region of mild ischemia in the apical region with WMA also noted in the apical region, EF 60%. 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 since 2010   History of chicken pox    History of eating disorder    HLD (hyperlipidemia)    HTN (hypertension)    Hypothyroidism    IBS (irritable bowel syndrome)    diarrhea    Incontinence of bowel     Osteopenia    Osteoporosis    Ovarian cancer ([place])    1980   Persistent atrial fibrillation ([place])    a. CHADS2VASc = > 8 (CHF, HTN, age x 2, DM, TIA x 2, female); b. on Xarelto   SBO (small bowel obstruction) ([place])    1998   TIA (transient ischemic attack)    UTI (urinary tract infection)    Venous insufficiency    Ventral hernia    Past Surgical History:  Procedure Laterality Date   2nd look laparotomy  1982   ABDOMINAL HYSTERECTOMY     for ovarian cancer s/p hysterectomy total 1976 and exp lap [place]  8/05   neg; a fib found    CARPAL TUNNEL RELEASE Left 03/06/2018   Procedure: CARPAL TUNNEL RELEASE ENDOSCOPIC;  Surgeon: Corky Mull, MD;  Location: [facility];  Service: Orthopedics;  Laterality: Left;  diabetic - oral meds   CARPAL TUNNEL RELEASE     left CTS Dr.Poggi   cataract OD  2002   cataract surgery  5/07   R   CHOLECYSTECTOMY  1986   DEXA  9/04 and 1/02   EGD/dilation/colon  1/06   ESOPHAGOGASTRODUODENOSCOPY (EGD) WITH PROPOFOL N/A 05/16/2016   Procedure: ESOPHAGOGASTRODUODENOSCOPY (EGD) WITH PROPOFOL with dilation;  Surgeon: Lucilla Lame, MD;  Location: [facility] ENDOSCOPY;  Service: Endoscopy;  Laterality: N/A;   FEMORAL HERNIA REPAIR  1958   R   fracture L elbow/wrist  2000   intussception/obstruction  12/98   JOINT REPLACEMENT     left shoulder   kidney stone x2  1991   KYPHOPLASTY N/A 04/13/2020   Procedure: [id number];  Surgeon: Hessie Knows, MD;  Location: [facility];  Service: Orthopedics;  Laterality: N/A;   KYPHOPLASTY N/A 05/18/2020   Procedure: L1 KYPHOPLASTY;  Surgeon: Hessie Knows, MD;  Location: [facility];  Service: Orthopedics;  Laterality: N/A;   KYPHOPLASTY N/A 07/06/2020   Procedure: L4  KYPHOPLASTY;  Surgeon: Hessie Knows, MD;  Location: [facility];  Service: Orthopedics;  Laterality: N/A;   laminectomy L4-5  1971   LUMBAR LAMINECTOMY     1970 ruptured disc    MOUTH SURGERY      myoview stress  8/05   syncope (-)   REVERSE SHOULDER ARTHROPLASTY Left 10/06/2014   Procedure: REVERSE SHOULDER ARTHROPLASTY;  Surgeon: Corky Mull, MD;  Location: [facility];  Service: Orthopedics;  Laterality: Left;   stillbirth  1969   TOTAL ABDOMINAL HYSTERECTOMY W/ BILATERAL SALPINGOOPHORECTOMY  1980   ovarian cancer   WRIST FRACTURE SURGERY  11/05   R   Social History   Socioeconomic History   Marital status: Widowed    Spouse name: Not on file   Number of children: 1   Years of education: Not on file   Highest education level: Not on file  Occupational History   Occupation: retired Therapist, 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  Tobacco Use   Smoking status: Never   Smokeless tobacco: Never  Vaping Use   Vaping Use: Never used  Substance and Sexual Activity   Alcohol use: No    Comment: may have occasional glass of wine   Drug use: No   Sexual activity: Not Currently  Other Topics Concern   Not on file  Social History Narrative   Has living will   Son is healthcare POA    also has grand daughter and great grand son   Would accept CPR but not prolonged artificial ventilation    No feeding tube if cognitively unaware   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)   2 pregnancies 1 stillborn    She has transportation problems no longer drives and has trouble finding a ride   Investment banker, operational of Radio broadcast assistant Strain: Medium Risk   Difficulty of Paying Living Expenses: Somewhat hard  Food Insecurity: No Food Insecurity   Worried About Charity fundraiser in the Last Year: Never true   Arboriculturist in the Last Year: Never true  Transportation Needs: Public librarian (Medical): Yes   Lack of Transportation (Non-Medical): Yes  Physical Activity: Insufficiently Active   Days of Exercise per Week: 3 days   Minutes of Exercise per Session: 20 min  Stress: No Stress Concern Present   Feeling of Stress : Not at all  Social Connections: Unknown   Frequency of  Communication with Friends and Family: Not on file   Frequency of Social Gatherings with Friends and Family: More than three times a week   Attends Religious Services: Not on file   Active Member of Clubs or Organizations: Not on file   Attends Archivist Meetings: Not on file   Marital Status: Not on file   Family History  Problem Relation Age of Onset  Early death Father    Diabetes Mother    Heart disease Mother    Arthritis Brother    Cancer Brother    Depression Brother    Hearing loss Brother    Arthritis Brother    Cancer Brother        colon   Hearing loss Brother    Cancer Brother    Diabetes Brother    Hearing loss Brother    Heart disease Brother    Hyperlipidemia Brother    Heart disease Sister    Hypertension Sister    Stroke Sister    Hearing loss Daughter    Hypertension Daughter    Diabetes Paternal Grandfather    Hearing loss Sister    Heart disease Sister    Arthritis Sister    Depression Sister    Hearing loss Sister    Heart disease Sister    COPD Brother    Early death Brother    Early death Brother    Allergies  Allergen Reactions   Amiodarone Other (See Comments)    Severe Thyroid issues    Fosamax [Alendronate]     dysphagia   Penicillins Anaphylaxis and Other (See Comments)    TOLERATED CEFAZOLIN 07/06/20 Has patient had a PCN reaction causing immediate rash, facial/tongue/throat swelling, SOB or lightheadedness with hypotension: Yes Has patient had a PCN reaction causing severe rash involving mucus membranes or skin necrosis: No Has patient had a PCN reaction that required hospitalization: No Has patient had a PCN reaction occurring within the last 10 years: No If all of the above answers are "NO", then may proceed with Cephalosporin use.    Sulfa Antibiotics Itching and Rash    Itching    Actos [Pioglitazone]     Not effective    Amaryl [Glimepiride]     Not effective     Atorvastatin Other (See Comments)    Muscle  aches & All statins per Patient   Bentyl [Dicyclomine Hcl]     Could not tolerate nausea, reduced concentration, h/a    Ciprofloxacin Other (See Comments)    High blood pressure     Codeine Nausea And Vomiting   Ezetimibe-Simvastatin Other (See Comments)    Muscle aches, nausea, back pain    Lovastatin Other (See Comments)    Myalgias   Macrobid [Nitrofurantoin Macrocrystal]     Severe Itching, rash    Protonix [Pantoprazole Sodium]     Esophageal problems  Wants removed from list   Rosiglitazone Maleate Other (See Comments)    Edema   Statins     Muscle and joint aches to all statins   Victoza [Liraglutide]     Nausea    Alendronate Sodium Rash    Dysphagia and ulceration    Bactrim [Sulfamethoxazole-Trimethoprim] Rash    itching   Prior to Admission medications   Medication Sig Start Date End Date Taking? Authorizing Provider  Ascorbic Acid (VITAMIN C) POWD Take 2,000 mg by mouth daily.   Yes [provider]  bisoprolol (ZEBETA) 5 MG tablet Take 5 mg by mouth in the morning and at bedtime.   Yes [provider]  Coenzyme Q10 50 MG TABS Take 10 mg by mouth daily.   Yes [provider]  estradiol (ESTRACE) 0.1 MG/GM vaginal cream APPLY 0.5MG  (PEA SIZED AMOUNT) JUST INSIDE THE VAGINA WITH FINGER-TIP ON MONDAY, WEDNESDAY AND FRIDAY NIGHTS. 07/21/20  Yes McGowan, Larene Beach A, PA-C  furosemide (LASIX) 20 MG tablet Take 1 tablet (20 mg  total) by mouth 2 (two) times daily. 10/05/20  Yes Gollan, Kathlene November, MD  levofloxacin (LEVAQUIN) 250 MG tablet Take 1 tablet (250 mg total) by mouth daily. 09/21/20  Yes McGowan, Larene Beach A, PA-C  levothyroxine (SYNTHROID, LEVOTHROID) 50 MCG tablet Take 50 mcg by mouth daily before breakfast.   Yes [provider]  Magnesium Oxide 250 MG TABS Take 250 mg by mouth daily.   Yes [provider]  metFORMIN (GLUCOPHAGE-XR) 500 MG 24 hr tablet Take 500 mg by mouth 2 (two) times daily.  06/17/19  Yes [provider]  Multiple Vitamins-Minerals (MULTIVITAMIN GUMMIES ADULT PO) Take 1 each by mouth daily.   Yes [provider]  Omega-3 1000 MG CAPS Take 1,000 mg by mouth 2 (two) times daily.   Yes [provider]  potassium chloride (KLOR-CON) 10 MEQ tablet Take 10 mEq by mouth 2 (two) times daily. 09/23/20  Yes [provider]  Probiotic Product (ALIGN) 4 MG CAPS Take 4 mg by mouth daily.   Yes [provider]  TRADJENTA 5 MG TABS tablet Take 5 mg by mouth daily. 10/05/20  Yes [provider]  vitamin B-12 (CYANOCOBALAMIN) 1000 MCG tablet Take 1,000 mcg by mouth daily. W/ Folate and B6 sublingual   Yes [provider]  XARELTO 20 MG TABS tablet Take 1 tablet (20 mg total) by mouth daily with supper. 06/14/20  Yes Minna Merritts, MD  acetaminophen (TYLENOL) 500 MG tablet Take 2 tablets (1,000 mg total) by mouth every 6 (six) hours as needed for moderate pain. 05/03/20 05/03/21  Harvest Dark, MD  docusate sodium (COLACE) 100 MG capsule Take 100 mg by mouth daily as needed for mild constipation.    [provider]  glucose blood (ONE TOUCH ULTRA TEST) test strip Use to test blood sugar once daily E11.49 Patient taking differently: Use to test blood sugar twice daily E11.49 06/24/15   Bedsole, Amy E, MD  losartan (COZAAR) 25 MG tablet Take 0.5 tablets (12.5 mg total) by mouth daily. TAKE IF BP>110 SBP Patient taking differently: Take 12.5 mg by mouth daily. 09/09/20   Minna Merritts, MD  Olopatadine HCl 0.2 % SOLN Place 1 drop into both eyes daily as needed (Allergies).    [provider]  psyllium (METAMUCIL) 58.6 % packet Take 1 packet by mouth daily as needed (constipation).    [provider]  Skin Protectants, Misc. (DIMETHICONE-ZINC OXIDE) cream Apply topically 2 (two) times daily as needed for dry skin. Please label the bottle for the vaginal area only. 06/29/20   [name], PA-C   DG Chest 2  View  Result Date: 10/09/2020 CLINICAL DATA:  Right-sided chest pain EXAM: CHEST - 2 VIEW COMPARISON:  04/06/2020 FINDINGS: Cardiac shadow is enlarged. Aortic calcifications are noted. The lungs are well aerated bilaterally without focal infiltrate or effusion. Changes of prior vertebral augmentation are noted and stable. Postsurgical changes in the left shoulder are noted. IMPRESSION: No acute abnormality noted. Electronically Signed   By: Inez Catalina M.D.   On: 10/09/2020 00:52   DG Elbow 2 Views Left  Result Date: 10/10/2020 CLINICAL DATA:  Tripped and fell EXAM: LEFT ELBOW - 2 VIEW COMPARISON:  09/24/2014 FINDINGS: Frontal and lateral views of the left elbow demonstrate a minimally displaced intra-articular fracture involving the lateral aspect of the radial head. Large joint effusion. Prominent dorsal soft tissue swelling of the proximal forearm. IMPRESSION: 1. Minimally displaced intra-articular fracture lateral aspect radial head. 2. Large  joint effusion. 3. Prominent dorsal soft tissue swelling. Electronically Signed   By: Randa Ngo M.D.   On: 10/10/2020 03:08   CT Head Wo Contrast  Result Date: 10/10/2020 CLINICAL DATA:  Fall EXAM: CT HEAD WITHOUT CONTRAST CT CERVICAL SPINE WITHOUT CONTRAST TECHNIQUE: Multidetector CT imaging of the head and cervical spine was performed following the standard protocol without intravenous contrast. Multiplanar CT image reconstructions of the cervical spine were also generated. COMPARISON:  10/08/2020 FINDINGS: CT HEAD FINDINGS Brain: There is no mass, hemorrhage or extra-axial collection. There is generalized atrophy without lobar predilection. There is hypoattenuation of the periventricular white matter, most commonly indicating chronic ischemic microangiopathy. Vascular: Atherosclerotic calcification of the vertebral and internal carotid arteries at the skull base. No abnormal hyperdensity of the major intracranial arteries or dural venous sinuses. Skull:  The visualized skull base, calvarium and extracranial soft tissues are normal. Sinuses/Orbits: No fluid levels or advanced mucosal thickening of the visualized paranasal sinuses. No mastoid or middle ear effusion. The orbits are normal. CT CERVICAL SPINE FINDINGS Alignment: No static subluxation. Facets are aligned. Occipital condyles are normally positioned. Skull base and vertebrae: No acute fracture. Soft tissues and spinal canal: No prevertebral fluid or swelling. No visible canal hematoma. Disc levels: No advanced spinal canal or neural foraminal stenosis. Multilevel degenerative change. Upper chest: No pneumothorax, pulmonary nodule or pleural effusion. Other: Normal visualized paraspinal cervical soft tissues. IMPRESSION: 1. Chronic ischemic microangiopathy and generalized atrophy without acute intracranial abnormality. 2. No acute fracture or static subluxation of the cervical spine. Electronically Signed   By: Ulyses Jarred M.D.   On: 10/10/2020 03:23   CT Cervical Spine Wo Contrast  Result Date: 10/10/2020 CLINICAL DATA:  Fall EXAM: CT HEAD WITHOUT CONTRAST CT CERVICAL SPINE WITHOUT CONTRAST TECHNIQUE: Multidetector CT imaging of the head and cervical spine was performed following the standard protocol without intravenous contrast. Multiplanar CT image reconstructions of the cervical spine were also generated. COMPARISON:  10/08/2020 FINDINGS: CT HEAD FINDINGS Brain: There is no mass, hemorrhage or extra-axial collection. There is generalized atrophy without lobar predilection. There is hypoattenuation of the periventricular white matter, most commonly indicating chronic ischemic microangiopathy. Vascular: Atherosclerotic calcification of the vertebral and internal carotid arteries at the skull base. No abnormal hyperdensity of the major intracranial arteries or dural venous sinuses. Skull: The visualized skull base, calvarium and extracranial soft tissues are normal. Sinuses/Orbits: No fluid levels or  advanced mucosal thickening of the visualized paranasal sinuses. No mastoid or middle ear effusion. The orbits are normal. CT CERVICAL SPINE FINDINGS Alignment: No static subluxation. Facets are aligned. Occipital condyles are normally positioned. Skull base and vertebrae: No acute fracture. Soft tissues and spinal canal: No prevertebral fluid or swelling. No visible canal hematoma. Disc levels: No advanced spinal canal or neural foraminal stenosis. Multilevel degenerative change. Upper chest: No pneumothorax, pulmonary nodule or pleural effusion. Other: Normal visualized paraspinal cervical soft tissues. IMPRESSION: 1. Chronic ischemic microangiopathy and generalized atrophy without acute intracranial abnormality. 2. No acute fracture or static subluxation of the cervical spine. Electronically Signed   By: Ulyses Jarred M.D.   On: 10/10/2020 03:23   MR BRAIN WO CONTRAST  Result Date: 10/09/2020 CLINICAL DATA:  Headache and right facial weakness EXAM: MRI HEAD WITHOUT CONTRAST TECHNIQUE: Multiplanar, multiecho pulse sequences of the brain and surrounding structures were obtained without intravenous contrast. COMPARISON:  None. FINDINGS: Brain: No acute infarct, mass effect or extra-axial collection. No acute or chronic hemorrhage. There is multifocal hyperintense T2-weighted signal within the  white matter. Generalized volume loss without a clear lobar predilection. The midline structures are normal. Vascular: Major flow voids are preserved. Skull and upper cervical spine: Normal calvarium and skull base. Visualized upper cervical spine and soft tissues are normal. Sinuses/Orbits:No paranasal sinus fluid levels or advanced mucosal thickening. No mastoid or middle ear effusion. Normal orbits. IMPRESSION: 1. No acute intracranial abnormality. 2. Generalized volume loss and findings of chronic small vessel disease. Electronically Signed   By: Ulyses Jarred M.D.   On: 10/09/2020 01:35   DG Chest Portable 1  View  Result Date: 10/10/2020 CLINICAL DATA:  [place], left hip fracture EXAM: PORTABLE CHEST 1 VIEW COMPARISON:  10/09/2020 FINDINGS: Single frontal view of the chest demonstrates a stable cardiac silhouette. No acute airspace disease, effusion, or pneumothorax. Prior vertebral augmentations at the thoracolumbar junction. Left shoulder arthroplasty. No acute fractures. IMPRESSION: 1. No acute intrathoracic process. Electronically Signed   By: Randa Ngo M.D.   On: 10/10/2020 03:07   DG Hip Unilat W or Wo Pelvis 2-3 Views Left  Result Date: 10/10/2020 CLINICAL DATA:  Tripped and fell, deformity EXAM: DG HIP (WITH OR WITHOUT PELVIS) 2-3V LEFT COMPARISON:  None. FINDINGS: Frontal view of the pelvis as well as frontal and cross-table lateral views of left hip are obtained. There is a comminuted intertrochanteric left hip fracture with impaction and varus angulation at the fracture site. No dislocation. Remainder of the bony pelvis is unremarkable. Prior L4 vertebral augmentation. IMPRESSION: 1. Comminuted intertrochanteric left hip fracture with varus angulation and impaction. Electronically Signed   By: Randa Ngo M.D.   On: 10/10/2020 03:06    Positive ROS: All other systems have been reviewed and were otherwise negative with the exception of those mentioned in the HPI and as above.  Physical Exam: General: Alert, no acute distress  MUSCULOSKELETAL:  Left upper extremity: Patient has a hematoma overlying the left elbow with a superficial abrasion.  Patient's left arm is in a sling.  She is neurovascular intact.  Left lower extremity: Patient has bilateral lower extremity lymphedema below her knees and involving her feet.  Her feet are well-perfused.  She has faint erythema in both lower legs which may be secondary to venous stasis versus early cellulitis.  She has shortening and external rotation of the left lower extremity but has intact sensation to light touch and intact motor function  bilaterally.  Her feet are well-perfused.  Her pedal pulses are difficult to palpate due to the lymphedema.  Approximately the skin is intact overlying the left hip.  Her thigh compartments are soft and compressible.  There is no erythema or masses over the left hip.  Assessment: #1 displaced left intertrochanteric hip fracture #2 minimally displaced comminuted fracture of the left radial head  Plan: I explained to the patient the nature of her fractures.  Her left elbow radial head fracture will not require surgical treatment.  Her left hip however is displaced and I explained that I would recommend treating this with an intramedullary rod.  I discussed the details of the operation as well as the postoperative course with her.  Unfortunately the patient states I just missed her son.  I will try to contact him by phone to discuss the plan.  The patient is on Xarelto and took her last dose last night.  I explained that we will have to wait 2 to 3 days to perform her hip surgery.  In the meantime she will be admitted to the hospital service for  preop clearance and pain control.  Her Xarelto will be held.  She understood and agreed with the plan and I answered all her questions.  I anticipate her surgery will be on Tuesday.   Thornton Park, MD    10/10/2020 7:07 PM

## 2020-10-10 NOTE — ED Notes (Signed)
Son at bedside.

## 2020-10-10 NOTE — ED Triage Notes (Signed)
Pt was using walker and tripped and fell. Has l leg shortening. Complains of l groin pain. States her head bounced but has no pain.

## 2020-10-10 NOTE — ED Provider Notes (Signed)
Unable to  [facility] Emergency Department Provider Note ____________________________________________   Event Date/Time   First MD Initiated Contact with Patient 10/10/20 0222     (approximate)  I have reviewed the triage vital signs and the nursing notes.   HISTORY  Chief Complaint Hip Pain (Pt tripped and fell. Complains of l hip pain.)    HPI DIOR DOMINIK is a 87 y.o. female with history of A. fib on Xarelto, CHF, hypertension, hyperlipidemia, TIA who presents to the emergency department EMS after she had a fall at home just prior to arrival.  States she was using her walker and went to turn off the light.  States she let go of the walker and turned to turn off the light and lost her balance falling onto her left side.  Complaining of left hip, left elbow pain.  States she did hit her head.  No loss of consciousness.  No neck or back pain.  No numbness, tingling or weakness.  No preceding chest pain, shortness of breath or dizziness that led to her fall.  Has chronic bilateral lower extremity lymphedema which is unchanged.  Lives at home alone.  States that she pressed her life alert which notified her son who called EMS.  She received fentanyl in route with EMS.  Still complaining of significant left hip pain.         Past Medical History:  Diagnosis Date   Allergy    Anxiety    Balance disorder    Chronic diastolic CHF (congestive heart failure) (HCC)    Colon polyps    Coronary artery disease, non-occlusive    a. LHC 11/2003: 10% LAD stenosis, 20% LCx stenosis, and 40% RCA stenosis; b. nuc stress test 12/15: small region of mild ischemia in the apical region with WMA also noted in the apical region, EF 60%. 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. since 2010   History of chicken pox    History of eating disorder    HLD (hyperlipidemia)    HTN (hypertension)    Hypothyroidism    IBS (irritable bowel syndrome)    diarrhea    Incontinence of bowel    Osteopenia    Osteoporosis    Ovarian cancer ([place])    1980   Persistent atrial fibrillation ([place])    a. CHADS2VASc = > 8 (CHF, HTN, age x 2, DM, TIA x 2, female); b. on Xarelto   SBO (small bowel obstruction) ([place])    1998   TIA (transient ischemic attack)    UTI (urinary tract infection)    Venous insufficiency    Ventral hernia     Patient Active Problem List   Diagnosis Date Noted   Closed left hip fracture ([place]) 10/10/2020   UTI (urinary tract infection) 04/10/2020   Abdominal pain 04/09/2020   Hypertension associated with diabetes ([place]) 03/15/2020   Obesity (BMI 30-39.9) 03/15/2020   Coronary artery disease involving native coronary artery of native heart without angina pectoris 03/15/2020   PAD (peripheral artery disease) ([place]) 08/14/2019   Foot ulcer ([place]) 06/19/2019   Lymphedema 06/09/2019   Chronic venous insufficiency 06/09/2019  Diabetic retinopathy associated with type 2 diabetes mellitus ([place]) 01/20/2019   Low back pain 09/24/2018   Overactive bladder 04/10/2018   History of UTI 04/10/2018   Hematuria 04/10/2018   Diarrhea 04/10/2018   Venous ulcer of leg ([place]) 04/10/2018   Status post endoscopic carpal tunnel release 03/19/2018   History of skin cancer 11/08/2017   Chronic anticoagulation 06/11/2017   Aortic atherosclerosis ([place]) 05/08/2017   Coronary artery disease, non-occlusive 03/29/2017   Persistent atrial fibrillation ([place]) 03/29/2017   Carpal tunnel syndrome 03/28/2017   Cervical radiculitis 03/28/2017   Problems with swallowing and mastication    Esophageal candidiasis ([place])    Stricture and stenosis of esophagus 05/02/2016   TIA (transient ischemic  attack) 06/24/2015   Status post reverse total shoulder replacement, left 10/16/2014   Humeral head fracture 10/06/2014   Chest pain 03/27/2014   Shortness of breath 03/27/2014   Chronic diastolic CHF (congestive heart failure) ([place]) 03/27/2014   Advanced directives, counseling/discussion 02/24/2014   Routine general medical examination at a health care facility 02/17/2013   Diabetes, polyneuropathy ([place]) 02/17/2013   Type 2 diabetes mellitus with diabetic polyneuropathy, without long-term current use of insulin ([place]) 02/17/2013   Gait instability 04/11/2012   Edema 11/14/2010   OTHER CONSTIPATION 05/31/2007   VENTRAL HERNIA 03/15/2007   Ovarian cancer ([place]) 11/20/2006   Hypothyroidism 11/20/2006   Type 2 diabetes mellitus with neurological manifestations, controlled ([place]) 11/20/2006   Essential hypertension 11/20/2006   Atrial fibrillation ([place]) 11/20/2006   Osteoporosis 11/20/2006   Hyperlipidemia due to type 2 diabetes mellitus ([place]) 11/20/2006    Past Surgical History:  Procedure Laterality Date   2nd look laparotomy  1982   ABDOMINAL HYSTERECTOMY     for ovarian cancer s/p hysterectomy total 1976 and exp lap 1980   APPENDECTOMY     BACK SURGERY     CARDIAC CATHETERIZATION  8/05   neg; a fib found    CARPAL TUNNEL RELEASE Left 03/06/2018   Procedure: CARPAL TUNNEL RELEASE ENDOSCOPIC;  Surgeon: Corky Mull, MD;  Location: [facility];  Service: Orthopedics;  Laterality: Left;  diabetic - oral meds   CARPAL TUNNEL RELEASE     left CTS Dr.Poggi   cataract OD  2002   cataract surgery  5/07   R   CHOLECYSTECTOMY  1986   DEXA  9/04 and 1/02   EGD/dilation/colon  1/06   ESOPHAGOGASTRODUODENOSCOPY (EGD) WITH PROPOFOL N/A 05/16/2016   Procedure: ESOPHAGOGASTRODUODENOSCOPY (EGD) WITH PROPOFOL with dilation;  Surgeon: Lucilla Lame, MD;  Location: [facility];  Service: Endoscopy;  Laterality: N/A;   FEMORAL HERNIA REPAIR  1958   R   fracture L elbow/wrist  2000    intussception/obstruction  12/98   JOINT REPLACEMENT     left shoulder   kidney stone x2  1991   KYPHOPLASTY N/A 04/13/2020   Procedure: [id number];  Surgeon: Hessie Knows, MD;  Location: [facility];  Service: Orthopedics;  Laterality: N/A;   KYPHOPLASTY N/A 05/18/2020   Procedure: L1 KYPHOPLASTY;  Surgeon: Hessie Knows, MD;  Location: [facility];  Service: Orthopedics;  Laterality: N/A;   KYPHOPLASTY N/A 07/06/2020   Procedure: L4  KYPHOPLASTY;  Surgeon: Hessie Knows, MD;  Location: [facility];  Service: Orthopedics;  Laterality: N/A;   laminectomy L4-5  1971   LUMBAR LAMINECTOMY     1970 ruptured disc    MOUTH SURGERY     myoview stress  8/05   syncope (-)   REVERSE SHOULDER ARTHROPLASTY Left 10/06/2014  Procedure: REVERSE SHOULDER ARTHROPLASTY;  Surgeon: Corky Mull, MD;  Location: [facility];  Service: Orthopedics;  Laterality: Left;   stillbirth  1969   TOTAL ABDOMINAL HYSTERECTOMY W/ BILATERAL SALPINGOOPHORECTOMY  1980   ovarian cancer   WRIST FRACTURE SURGERY  11/05   R    Prior to Admission medications   Medication Sig Start Date End Date Taking? Authorizing Provider  levofloxacin (LEVAQUIN) 250 MG tablet Take 1 tablet (250 mg total) by mouth daily. 09/21/20  Yes McGowan, Larene Beach A, PA-C  acetaminophen (TYLENOL) 500 MG tablet Take 2 tablets (1,000 mg total) by mouth every 6 (six) hours as needed for moderate pain. 05/03/20 05/03/21  Harvest Dark, MD  Ascorbic Acid (VITAMIN C) POWD Take 2,000 mg by mouth daily.    [provider]  bisoprolol (ZEBETA) 5 MG tablet Take 5 mg by mouth in the morning and at bedtime.    [provider]  Coenzyme Q10 50 MG TABS Take 10 mg by mouth daily.    [provider]  docusate sodium (COLACE) 100 MG capsule Take 100 mg by mouth daily as needed for mild constipation.    [provider]  estradiol (ESTRACE) 0.1 MG/GM vaginal cream APPLY 0.5MG  (PEA SIZED AMOUNT) JUST INSIDE THE VAGINA WITH FINGER-TIP ON  MONDAY, WEDNESDAY AND FRIDAY NIGHTS. 07/21/20   Zara Council A, PA-C  furosemide (LASIX) 20 MG tablet Take 1 tablet (20 mg total) by mouth 2 (two) times daily. 10/05/20   Minna Merritts, MD  glucose blood (ONE TOUCH ULTRA TEST) test strip Use to test blood sugar once daily E11.49 Patient taking differently: Use to test blood sugar twice daily E11.49 06/24/15   Bedsole, Amy E, MD  levothyroxine (SYNTHROID, LEVOTHROID) 50 MCG tablet Take 50 mcg by mouth daily before breakfast.    [provider]  losartan (COZAAR) 25 MG tablet Take 0.5 tablets (12.5 mg total) by mouth daily. TAKE IF BP>110 SBP 09/09/20   Minna Merritts, MD  Magnesium Oxide 250 MG TABS Take 250 mg by mouth daily.    [provider]  metFORMIN (GLUCOPHAGE-XR) 500 MG 24 hr tablet Take 500 mg by mouth 2 (two) times daily.  06/17/19   [provider]  Multiple Vitamins-Minerals (MULTIVITAMIN GUMMIES ADULT PO) Take 1 each by mouth daily.    [provider]  Olopatadine HCl 0.2 % SOLN Place 1 drop into both eyes daily as needed (Allergies).    [provider]  Omega-3 1000 MG CAPS Take 1,000 mg by mouth 2 (two) times daily.    [provider]  potassium chloride (KLOR-CON) 10 MEQ tablet Take 10 mEq by mouth 2 (two) times daily. 09/23/20   [provider]  Probiotic Product (ALIGN) 4 MG CAPS Take 4 mg by mouth daily.    [provider]  psyllium (METAMUCIL) 58.6 % packet Take 1 packet by mouth daily as needed (constipation).    [provider]  Skin Protectants, Misc. (DIMETHICONE-ZINC OXIDE) cream Apply topically 2 (two) times daily as needed for dry skin. Please label the bottle for the vaginal area only. 06/29/20   McGowan, Larene Beach A, PA-C  TRADJENTA 5 MG TABS tablet Take 5 mg by mouth daily. 10/05/20   [provider]  vitamin B-12 (CYANOCOBALAMIN) 1000 MCG tablet Take 1,000 mcg by mouth daily. W/ Folate and B6 sublingual    [provider]   XARELTO 20 MG TABS tablet Take 1 tablet (20 mg total) by mouth daily with supper. 06/14/20  Minna Merritts, MD    Allergies Amiodarone, Fosamax [alendronate], Penicillins, Sulfa antibiotics, Actos [pioglitazone], Amaryl [glimepiride], Atorvastatin, Bentyl [dicyclomine hcl], Ciprofloxacin, Codeine, Ezetimibe-simvastatin, Lovastatin, Macrobid [nitrofurantoin macrocrystal], Protonix [pantoprazole sodium], Rosiglitazone maleate, Statins, Victoza [liraglutide], Alendronate sodium, and Bactrim [sulfamethoxazole-trimethoprim]  Family History  Problem Relation Age of Onset   Early death Father    Diabetes Mother    Heart disease Mother    Arthritis Brother    Cancer Brother    Depression Brother    Hearing loss Brother    Arthritis Brother    Cancer Brother        colon   Hearing loss Brother    Cancer Brother    Diabetes Brother    Hearing loss Brother    Heart disease Brother    Hyperlipidemia Brother    Heart disease Sister    Hypertension Sister    Stroke Sister    Hearing loss Daughter    Hypertension Daughter    Diabetes Paternal Grandfather    Hearing loss Sister    Heart disease Sister    Arthritis Sister    Depression Sister    Hearing loss Sister    Heart disease Sister    COPD Brother    Early death Brother    Early death Brother     Social History Social History   Tobacco Use   Smoking status: Never   Smokeless tobacco: Never  Vaping Use   Vaping Use: Never used  Substance Use Topics   Alcohol use: No    Comment: may have occasional glass of wine   Drug use: No    Review of Systems Constitutional: No fever. Eyes: No visual changes. ENT: No sore throat. Cardiovascular: Denies chest pain. Respiratory: Denies shortness of breath. Gastrointestinal: No nausea, vomiting, diarrhea. Genitourinary: Negative for dysuria. Musculoskeletal: Negative for back pain. Skin: Negative for rash. Neurological: Negative for focal weakness or  numbness.   ____________________________________________   PHYSICAL EXAM:  VITAL SIGNS: ED Triage Vitals  Enc Vitals Group     BP 10/10/20 0228 (!) 157/71     Pulse Rate 10/10/20 0228 98     Resp 10/10/20 0228 16     Temp 10/10/20 0228 97.8 F (36.6 C)     Temp Source 10/10/20 0228 Oral     SpO2 10/10/20 0226 100 %     Weight 10/10/20 0230 137 lb 2 oz (62.2 kg)     Height 10/10/20 0230 5\' 3"  (1.6 m)     Head Circumference --      Peak Flow --      Pain Score 10/10/20 0229 10     Pain Loc --      Pain Edu? --      Excl. in [place]? --    CONSTITUTIONAL: Alert and oriented and responds appropriately to questions.  Elderly, appears uncomfortable HEAD: Normocephalic; atraumatic EYES: Conjunctivae clear, PERRL, EOMI ENT: normal nose; no rhinorrhea; moist mucous membranes; pharynx without lesions noted; no dental injury; no septal hematoma NECK: Supple, no meningismus, no LAD; no midline spinal tenderness, step-off or deformity; trachea midline CARD: Irregularly irregular and rate controlled, S1 and S2 appreciated; no murmurs, no clicks, no rubs, no gallops RESP: Normal chest excursion without splinting or tachypnea; breath sounds clear and equal bilaterally; no wheezes, no rhonchi, no rales; no hypoxia or respiratory distress CHEST:  chest wall stable, no crepitus or ecchymosis or deformity, nontender to palpation; no flail chest ABD/GI: Normal bowel sounds; non-distended; soft, non-tender, no rebound,  no guarding; no ecchymosis or other lesions noted PELVIS:  stable, nontender to palpation BACK:  The back appears normal and is non-tender to palpation, there is no CVA tenderness; no midline spinal tenderness, step-off or deformity EXT: Patient's left lower extremity is shortened and externally rotated.  She is tender to palpation over the left hip.  She has chronic lymphedema that is symmetric in bilateral lower extremities without redness, warmth.  2+ DP pulses bilaterally.  Patient also  has swelling and tenderness to the left elbow without deformity.  2+ radial pulses bilaterally. SKIN: Normal color for age and race; warm NEURO: Moves all extremities equally, no facial asymmetry, normal speech PSYCH: The patient's mood and manner are appropriate. Grooming and personal hygiene are appropriate.  ____________________________________________   LABS (all labs ordered are listed, but only abnormal results are displayed)  Labs Reviewed  CBC WITH DIFFERENTIAL/PLATELET - Abnormal; Notable for the following components:      Result Value   RBC 3.17 (*)    Hemoglobin 10.8 (*)    HCT 33.1 (*)    MCV 104.4 (*)    MCH 34.1 (*)    Neutro Abs 8.1 (*)    Abs Immature Granulocytes 0.08 (*)    All other components within normal limits  BASIC METABOLIC PANEL - Abnormal; Notable for the following components:   Glucose, Bld 253 (*)    Calcium 8.2 (*)    All other components within normal limits  PROTIME-INR - Abnormal; Notable for the following components:   Prothrombin Time 29.0 (*)    INR 2.7 (*)    All other components within normal limits  CBG MONITORING, ED - Abnormal; Notable for the following components:   Glucose-Capillary 225 (*)    All other components within normal limits  RESP PANEL BY RT-PCR (FLU A&B, COVID) [id number]  TYPE AND SCREEN   ____________________________________________  EKG   EKG Interpretation  Date/Time:  Sunday October 10 2020 04:09:48 EDT Ventricular Rate:  86 PR Interval:    QRS Duration: 66 QT Interval:  390 QTC Calculation: 466 R Axis:   -34 Text Interpretation: Atrial fibrillation Left axis deviation Low voltage QRS Septal infarct , age undetermined Abnormal ECG Confirmed by Pryor Curia [phone]) on 10/10/2020 5:05:47 AM         ____________________________________________  RADIOLOGY Jessie Foot Kabao Leite, personally viewed and evaluated these images (plain radiographs) as part of my medical decision making, as well as reviewing the written  report by the radiologist.  ED MD interpretation: CT head, cervical spine show no acute injury.  X-ray of the left elbow shows minimally displaced intra-articular fracture of the radial head with large joint effusion.  X-ray of the left hip shows comminuted, impacted and angulated left intertrochanteric hip fracture.  Official radiology report(s): DG Elbow 2 Views Left  Result Date: 10/10/2020 CLINICAL DATA:  Tripped and fell EXAM: LEFT ELBOW - 2 VIEW COMPARISON:  09/24/2014 FINDINGS: Frontal and lateral views of the left elbow demonstrate a minimally displaced intra-articular fracture involving the lateral aspect of the radial head. Large joint effusion. Prominent dorsal soft tissue swelling of the proximal forearm. IMPRESSION: 1. Minimally displaced intra-articular fracture lateral aspect radial head. 2. Large joint effusion. 3. Prominent dorsal soft tissue swelling. Electronically Signed   By: Randa Ngo M.D.   On: 10/10/2020 03:08   CT Head Wo Contrast  Result Date: 10/10/2020 CLINICAL DATA:  Fall EXAM: CT HEAD WITHOUT CONTRAST CT CERVICAL SPINE WITHOUT CONTRAST TECHNIQUE: Multidetector CT imaging of the head  and cervical spine was performed following the standard protocol without intravenous contrast. Multiplanar CT image reconstructions of the cervical spine were also generated. COMPARISON:  10/08/2020 FINDINGS: CT HEAD FINDINGS Brain: There is no mass, hemorrhage or extra-axial collection. There is generalized atrophy without lobar predilection. There is hypoattenuation of the periventricular white matter, most commonly indicating chronic ischemic microangiopathy. Vascular: Atherosclerotic calcification of the vertebral and internal carotid arteries at the skull base. No abnormal hyperdensity of the major intracranial arteries or dural venous sinuses. Skull: The visualized skull base, calvarium and extracranial soft tissues are normal. Sinuses/Orbits: No fluid levels or advanced mucosal  thickening of the visualized paranasal sinuses. No mastoid or middle ear effusion. The orbits are normal. CT CERVICAL SPINE FINDINGS Alignment: No static subluxation. Facets are aligned. Occipital condyles are normally positioned. Skull base and vertebrae: No acute fracture. Soft tissues and spinal canal: No prevertebral fluid or swelling. No visible canal hematoma. Disc levels: No advanced spinal canal or neural foraminal stenosis. Multilevel degenerative change. Upper chest: No pneumothorax, pulmonary nodule or pleural effusion. Other: Normal visualized paraspinal cervical soft tissues. IMPRESSION: 1. Chronic ischemic microangiopathy and generalized atrophy without acute intracranial abnormality. 2. No acute fracture or static subluxation of the cervical spine. Electronically Signed   By: Ulyses Jarred M.D.   On: 10/10/2020 03:23   CT Cervical Spine Wo Contrast  Result Date: 10/10/2020 CLINICAL DATA:  Fall EXAM: CT HEAD WITHOUT CONTRAST CT CERVICAL SPINE WITHOUT CONTRAST TECHNIQUE: Multidetector CT imaging of the head and cervical spine was performed following the standard protocol without intravenous contrast. Multiplanar CT image reconstructions of the cervical spine were also generated. COMPARISON:  10/08/2020 FINDINGS: CT HEAD FINDINGS Brain: There is no mass, hemorrhage or extra-axial collection. There is generalized atrophy without lobar predilection. There is hypoattenuation of the periventricular white matter, most commonly indicating chronic ischemic microangiopathy. Vascular: Atherosclerotic calcification of the vertebral and internal carotid arteries at the skull base. No abnormal hyperdensity of the major intracranial arteries or dural venous sinuses. Skull: The visualized skull base, calvarium and extracranial soft tissues are normal. Sinuses/Orbits: No fluid levels or advanced mucosal thickening of the visualized paranasal sinuses. No mastoid or middle ear effusion. The orbits are normal. CT  CERVICAL SPINE FINDINGS Alignment: No static subluxation. Facets are aligned. Occipital condyles are normally positioned. Skull base and vertebrae: No acute fracture. Soft tissues and spinal canal: No prevertebral fluid or swelling. No visible canal hematoma. Disc levels: No advanced spinal canal or neural foraminal stenosis. Multilevel degenerative change. Upper chest: No pneumothorax, pulmonary nodule or pleural effusion. Other: Normal visualized paraspinal cervical soft tissues. IMPRESSION: 1. Chronic ischemic microangiopathy and generalized atrophy without acute intracranial abnormality. 2. No acute fracture or static subluxation of the cervical spine. Electronically Signed   By: Ulyses Jarred M.D.   On: 10/10/2020 03:23   DG Chest Portable 1 View  Result Date: 10/10/2020 CLINICAL DATA:  [place], left hip fracture EXAM: PORTABLE CHEST 1 VIEW COMPARISON:  10/09/2020 FINDINGS: Single frontal view of the chest demonstrates a stable cardiac silhouette. No acute airspace disease, effusion, or pneumothorax. Prior vertebral augmentations at the thoracolumbar junction. Left shoulder arthroplasty. No acute fractures. IMPRESSION: 1. No acute intrathoracic process. Electronically Signed   By: Randa Ngo M.D.   On: 10/10/2020 03:07   DG Hip Unilat W or Wo Pelvis 2-3 Views Left  Result Date: 10/10/2020 CLINICAL DATA:  Tripped and fell, deformity EXAM: DG HIP (WITH OR WITHOUT PELVIS) 2-3V LEFT COMPARISON:  None. FINDINGS: Frontal view of the  pelvis as well as frontal and cross-table lateral views of left hip are obtained. There is a comminuted intertrochanteric left hip fracture with impaction and varus angulation at the fracture site. No dislocation. Remainder of the bony pelvis is unremarkable. Prior L4 vertebral augmentation. IMPRESSION: 1. Comminuted intertrochanteric left hip fracture with varus angulation and impaction. Electronically Signed   By: Randa Ngo M.D.   On: 10/10/2020 03:06     ____________________________________________   PROCEDURES  Procedure(s) performed (including Critical Care):  Procedures   ____________________________________________   INITIAL IMPRESSION / ASSESSMENT AND PLAN / ED COURSE  As part of my medical decision making, I reviewed the following data within the [place] notes reviewed and incorporated, Labs reviewed , EKG interpreted , Old EKG reviewed, Old chart reviewed, Radiograph reviewed , Discussed with admitting physician , A consult was requested and obtained from this/these consultant(s) Orthopedics, and Notes from prior ED visits         Patient here from home after mechanical fall.  Likely has a hip fracture.  Will obtain x-rays of her hip, elbow.  She also reports hitting her head and is on Xarelto.  Will obtain CT head and cervical spine.  Will obtain labs, EKG, chest x-ray for medical clearance.  Will give pain medication here.  ED PROGRESS  3:30 AM  CT head and cervical spine show no acute traumatic injury.  Chest x-ray clear.  Left elbow x-ray shows minimally displaced intra-articular fracture of the lateral aspect of the radial head with a large joint effusion.  Will place in a sling.  X-ray of the left hip shows comminuted intertrochanteric left hip fracture with varus angulation and impaction.  Will discuss with orthopedics on-call.  3:35 AM  Discussed with Dr. Mack Guise with orthopedics.  Appreciate his help.  Operate on patient until 3 days after her last Xarelto dose.  Patient unable to reliably give this history.  Spoke with son and he said she took Xarelto sometime tonight (10/09/20) - he states normally around 8-9pm.   4:40 AM  Pt's labs show INR of 2.7 but otherwise unremarkable.  Will discuss with hospitalist for admission.  5:00 AM Discussed patient's case with hospitalist, Dr. Sidney Ace.  I have recommended admission and patient (and family if present) agree with this plan. Admitting  physician will place admission orders.   I reviewed all nursing notes, vitals, pertinent previous records and reviewed/interpreted all EKGs, lab and urine results, imaging (as available).  ____________________________________________   FINAL CLINICAL IMPRESSION(S) / ED DIAGNOSES  Final diagnoses:  Fall, initial encounter  Injury of head, initial encounter  Closed intertrochanteric fracture of hip, left, initial encounter ([place])  Closed displaced fracture of head of left radius, initial encounter     ED Discharge Orders     None       *Please note:  LANE KJOS was evaluated in Emergency Department on 10/10/2020 for the symptoms described in the history of present illness. She was evaluated in the context of the global COVID-19 pandemic, which necessitated consideration that the patient might be at risk for infection with the SARS-CoV-2 virus that causes COVID-19. Institutional protocols and algorithms that pertain to the evaluation of patients at risk for COVID-19 are in a state of rapid change based on information released by regulatory bodies including the CDC and federal and state organizations. These policies and algorithms were followed during the patient's care in the ED.  Some ED evaluations and interventions may be delayed as a  result of limited staffing during and the pandemic.*   Note:  This document was prepared using Dragon voice recognition software and may include unintentional dictation errors.    Abeni Finchum, Delice Bison, DO 10/10/20 [phone]

## 2020-10-10 NOTE — ED Notes (Signed)
Report called to Kate, RN.

## 2020-10-10 NOTE — ED Notes (Signed)
ED Provider at bedside. 

## 2020-10-11 DIAGNOSIS — D696 Thrombocytopenia, unspecified: Secondary | ICD-10-CM

## 2020-10-11 DIAGNOSIS — D62 Acute posthemorrhagic anemia: Secondary | ICD-10-CM

## 2020-10-11 LAB — CBC
HCT: 24 % — ABNORMAL LOW (ref 36.0–46.0)
Hemoglobin: 7.8 g/dL — ABNORMAL LOW (ref 12.0–15.0)
MCH: 34.2 pg — ABNORMAL HIGH (ref 26.0–34.0)
MCHC: 32.5 g/dL (ref 30.0–36.0)
MCV: 105.3 fL — ABNORMAL HIGH (ref 80.0–100.0)
Platelets: 122 10*3/uL — ABNORMAL LOW (ref 150–400)
RBC: 2.28 MIL/uL — ABNORMAL LOW (ref 3.87–5.11)
RDW: 15 % (ref 11.5–15.5)
WBC: 9 10*3/uL (ref 4.0–10.5)
nRBC: 0 % (ref 0.0–0.2)

## 2020-10-11 LAB — BASIC METABOLIC PANEL
Anion gap: 5 (ref 5–15)
BUN: 15 mg/dL (ref 8–23)
CO2: 24 mmol/L (ref 22–32)
Calcium: 7.6 mg/dL — ABNORMAL LOW (ref 8.9–10.3)
Chloride: 108 mmol/L (ref 98–111)
Creatinine, Ser: 0.56 mg/dL (ref 0.44–1.00)
GFR, Estimated: >60 mL/min (ref >60)
Glucose, Bld: 165 mg/dL — ABNORMAL HIGH (ref 70–99)
Potassium: 4.4 mmol/L (ref 3.5–5.1)
Sodium: 137 mmol/L (ref 135–145)

## 2020-10-11 LAB — SURGICAL PCR SCREEN
MRSA, PCR: NEGATIVE
Staphylococcus aureus: NEGATIVE

## 2020-10-11 LAB — MAGNESIUM: Magnesium: 2 mg/dL (ref 1.7–2.4)

## 2020-10-11 LAB — GLUCOSE, CAPILLARY
Glucose-Capillary: 162 mg/dL — ABNORMAL HIGH (ref 70–99)
Glucose-Capillary: 167 mg/dL — ABNORMAL HIGH (ref 70–99)
Glucose-Capillary: 236 mg/dL — ABNORMAL HIGH (ref 70–99)
Glucose-Capillary: 171 mg/dL — ABNORMAL HIGH (ref 70–99)

## 2020-10-11 LAB — PREPARE RBC (CROSSMATCH)

## 2020-10-11 LAB — APTT: aPTT: 36 seconds (ref 24–36)

## 2020-10-11 LAB — HEMOGLOBIN A1C
Hgb A1c MFr Bld: 7.8 % — ABNORMAL HIGH (ref 4.8–5.6)
Mean Plasma Glucose: 177 mg/dL

## 2020-10-11 LAB — ABO/RH: ABO/RH(D): A POS

## 2020-10-11 MED ORDER — HYDROCODONE-ACETAMINOPHEN 5-325 MG PO TABS
1.0000 | ORAL_TABLET | ORAL | Status: DC | PRN
  Administered 2020-10-11 (×2): 1 via ORAL
  Filled 2020-10-11 (×2): qty 1

## 2020-10-11 MED ORDER — MORPHINE SULFATE (PF) 2 MG/ML IV SOLN
2.0000 mg | INTRAVENOUS | Status: DC | PRN
  Administered 2020-10-11 – 2020-10-12 (×4): 2 mg via INTRAVENOUS
  Filled 2020-10-11 (×4): qty 1

## 2020-10-11 MED ORDER — SODIUM CHLORIDE 0.9% IV SOLUTION: Freq: Once | INTRAVENOUS | Status: AC

## 2020-10-11 MED ORDER — FUROSEMIDE 10 MG/ML IJ SOLN
20.0000 mg | Freq: Once | INTRAMUSCULAR | Status: AC
  Administered 2020-10-11: 20 mg via INTRAVENOUS
  Filled 2020-10-11: qty 4

## 2020-10-11 NOTE — Progress Notes (Signed)
Subjective:  Patient is seen in her hospital bed.  Her brother from [place] is at the bedside.  Patient complains of left hip pain.  She is receiving a unit of PRBCs.  Patient has been tachycardic and her hemoglobin today was 7.8.  Patient has a nondisplaced fracture of the left radial head and a displaced left intertrochanteric hip fracture.  Patient presented on Xarelto and her surgery fix her hip fracture has been delayed until the Xarelto has cleared out of her system.  Objective:   VITALS:   Vitals:   10/11/20 0548 10/11/20 0809 10/11/20 1256 10/11/20 1330  BP: 99/61 (!) 143/72 119/69 125/78  Pulse: 97 93 (!) 108 (!) 104  Resp: 16 16 16 16   Temp: 98.5 F (36.9 C) 98.5 F (36.9 C) 98.2 F (36.8 C) 97.9 F (36.6 C)  TempSrc: Oral Oral Oral Oral  SpO2: 98% 98% 98% 98%  Weight:      Height:        PHYSICAL EXAM: Left lower extremity: Patient has shortening and external rotation of the left lower extremity.  She has bilateral lymphedema.  Patient has intact sensation light touch in both lower extremities.  She has palpable pedal pulses and she can dorsiflex and plantarflex both ankles and flex and extend her toes.  Patient's skin is intact overlying the left hip.  Her thigh compartments are soft and compressible.  Left elbow: Patient has a hematoma with superficial skin at the tip of the elbow.  Distally she is neurovascular intact.  No obvious deformity.   LABS  Results for orders placed or performed during the hospital encounter of 10/10/20 (from the past 24 hour(s))  CBG monitoring, ED     Status: Abnormal   Collection Time: 10/10/20  5:16 PM  Result Value Ref Range   Glucose-Capillary 135 (H) 70 - 99 mg/dL  Glucose, capillary     Status: Abnormal   Collection Time: 10/10/20  9:26 PM  Result Value Ref Range   Glucose-Capillary 161 (H) 70 - 99 mg/dL  Surgical PCR screen     Status: None   Collection Time: 10/10/20 11:20 PM   Specimen: Nasal Mucosa; Nasal Swab  Result  Value Ref Range   MRSA, PCR NEGATIVE NEGATIVE   Staphylococcus aureus NEGATIVE NEGATIVE  Basic metabolic panel     Status: Abnormal   Collection Time: 10/11/20  4:54 AM  Result Value Ref Range   Sodium 137 135 - 145 mmol/L   Potassium 4.4 3.5 - 5.1 mmol/L   Chloride 108 98 - 111 mmol/L   CO2 24 22 - 32 mmol/L   Glucose, Bld 165 (H) 70 - 99 mg/dL   BUN 15 8 - 23 mg/dL   Creatinine, Ser 0.56 0.44 - 1.00 mg/dL   Calcium 7.6 (L) 8.9 - 10.3 mg/dL   GFR, Estimated >60 >60 mL/min   Anion gap 5 5 - 15  CBC     Status: Abnormal   Collection Time: 10/11/20  4:54 AM  Result Value Ref Range   WBC 9.0 4.0 - 10.5 K/uL   RBC 2.28 (L) 3.87 - 5.11 MIL/uL   Hemoglobin 7.8 (L) 12.0 - 15.0 g/dL   HCT 24.0 (L) 36.0 - 46.0 %   MCV 105.3 (H) 80.0 - 100.0 fL   MCH 34.2 (H) 26.0 - 34.0 pg   MCHC 32.5 30.0 - 36.0 g/dL   RDW 15.0 11.5 - 15.5 %   Platelets 122 (L) 150 - 400 K/uL   nRBC 0.0  0.0 - 0.2 %  Magnesium     Status: None   Collection Time: 10/11/20  4:54 AM  Result Value Ref Range   Magnesium 2.0 1.7 - 2.4 mg/dL  ABO/Rh     Status: None   Collection Time: 10/11/20  4:54 AM  Result Value Ref Range   ABO/RH(D)      A POS Performed at [facility], [address] [place], [place] [address]   Glucose, capillary     Status: Abnormal   Collection Time: 10/11/20  7:48 AM  Result Value Ref Range   Glucose-Capillary 162 (H) 70 - 99 mg/dL  Prepare RBC (crossmatch)     Status: None   Collection Time: 10/11/20 10:57 AM  Result Value Ref Range   Order Confirmation      ORDER PROCESSED BY BLOOD BANK Performed at [facility], [address] [place], [place] [address]   Glucose, capillary     Status: Abnormal   Collection Time: 10/11/20 11:27 AM  Result Value Ref Range   Glucose-Capillary 167 (H) 70 - 99 mg/dL    DG Elbow 2 Views Left  Result Date: 10/10/2020 CLINICAL DATA:  Tripped and fell EXAM: LEFT ELBOW - 2 VIEW COMPARISON:  09/24/2014 FINDINGS: Frontal and lateral  views of the left elbow demonstrate a minimally displaced intra-articular fracture involving the lateral aspect of the radial head. Large joint effusion. Prominent dorsal soft tissue swelling of the proximal forearm. IMPRESSION: 1. Minimally displaced intra-articular fracture lateral aspect radial head. 2. Large joint effusion. 3. Prominent dorsal soft tissue swelling. Electronically Signed   By: Randa Ngo M.D.   On: 10/10/2020 03:08   CT Head Wo Contrast  Result Date: 10/10/2020 CLINICAL DATA:  Fall EXAM: CT HEAD WITHOUT CONTRAST CT CERVICAL SPINE WITHOUT CONTRAST TECHNIQUE: Multidetector CT imaging of the head and cervical spine was performed following the standard protocol without intravenous contrast. Multiplanar CT image reconstructions of the cervical spine were also generated. COMPARISON:  10/08/2020 FINDINGS: CT HEAD FINDINGS Brain: There is no mass, hemorrhage or extra-axial collection. There is generalized atrophy without lobar predilection. There is hypoattenuation of the periventricular white matter, most commonly indicating chronic ischemic microangiopathy. Vascular: Atherosclerotic calcification of the vertebral and internal carotid arteries at the skull base. No abnormal hyperdensity of the major intracranial arteries or dural venous sinuses. Skull: The visualized skull base, calvarium and extracranial soft tissues are normal. Sinuses/Orbits: No fluid levels or advanced mucosal thickening of the visualized paranasal sinuses. No mastoid or middle ear effusion. The orbits are normal. CT CERVICAL SPINE FINDINGS Alignment: No static subluxation. Facets are aligned. Occipital condyles are normally positioned. Skull base and vertebrae: No acute fracture. Soft tissues and spinal canal: No prevertebral fluid or swelling. No visible canal hematoma. Disc levels: No advanced spinal canal or neural foraminal stenosis. Multilevel degenerative change. Upper chest: No pneumothorax, pulmonary nodule or  pleural effusion. Other: Normal visualized paraspinal cervical soft tissues. IMPRESSION: 1. Chronic ischemic microangiopathy and generalized atrophy without acute intracranial abnormality. 2. No acute fracture or static subluxation of the cervical spine. Electronically Signed   By: Ulyses Jarred M.D.   On: 10/10/2020 03:23   CT Cervical Spine Wo Contrast  Result Date: 10/10/2020 CLINICAL DATA:  Fall EXAM: CT HEAD WITHOUT CONTRAST CT CERVICAL SPINE WITHOUT CONTRAST TECHNIQUE: Multidetector CT imaging of the head and cervical spine was performed following the standard protocol without intravenous contrast. Multiplanar CT image reconstructions of the cervical spine were also generated. COMPARISON:  10/08/2020 FINDINGS: CT  HEAD FINDINGS Brain: There is no mass, hemorrhage or extra-axial collection. There is generalized atrophy without lobar predilection. There is hypoattenuation of the periventricular white matter, most commonly indicating chronic ischemic microangiopathy. Vascular: Atherosclerotic calcification of the vertebral and internal carotid arteries at the skull base. No abnormal hyperdensity of the major intracranial arteries or dural venous sinuses. Skull: The visualized skull base, calvarium and extracranial soft tissues are normal. Sinuses/Orbits: No fluid levels or advanced mucosal thickening of the visualized paranasal sinuses. No mastoid or middle ear effusion. The orbits are normal. CT CERVICAL SPINE FINDINGS Alignment: No static subluxation. Facets are aligned. Occipital condyles are normally positioned. Skull base and vertebrae: No acute fracture. Soft tissues and spinal canal: No prevertebral fluid or swelling. No visible canal hematoma. Disc levels: No advanced spinal canal or neural foraminal stenosis. Multilevel degenerative change. Upper chest: No pneumothorax, pulmonary nodule or pleural effusion. Other: Normal visualized paraspinal cervical soft tissues. IMPRESSION: 1. Chronic ischemic  microangiopathy and generalized atrophy without acute intracranial abnormality. 2. No acute fracture or static subluxation of the cervical spine. Electronically Signed   By: Ulyses Jarred M.D.   On: 10/10/2020 03:23   DG Chest Portable 1 View  Result Date: 10/10/2020 CLINICAL DATA:  [place], left hip fracture EXAM: PORTABLE CHEST 1 VIEW COMPARISON:  10/09/2020 FINDINGS: Single frontal view of the chest demonstrates a stable cardiac silhouette. No acute airspace disease, effusion, or pneumothorax. Prior vertebral augmentations at the thoracolumbar junction. Left shoulder arthroplasty. No acute fractures. IMPRESSION: 1. No acute intrathoracic process. Electronically Signed   By: Randa Ngo M.D.   On: 10/10/2020 03:07   DG Hip Unilat W or Wo Pelvis 2-3 Views Left  Result Date: 10/10/2020 CLINICAL DATA:  Tripped and fell, deformity EXAM: DG HIP (WITH OR WITHOUT PELVIS) 2-3V LEFT COMPARISON:  None. FINDINGS: Frontal view of the pelvis as well as frontal and cross-table lateral views of left hip are obtained. There is a comminuted intertrochanteric left hip fracture with impaction and varus angulation at the fracture site. No dislocation. Remainder of the bony pelvis is unremarkable. Prior L4 vertebral augmentation. IMPRESSION: 1. Comminuted intertrochanteric left hip fracture with varus angulation and impaction. Electronically Signed   By: Randa Ngo M.D.   On: 10/10/2020 03:06    Assessment/Plan:     Active Problems:   Hypothyroidism   Essential hypertension   Permanent atrial fibrillation (HCC)   Chronic diastolic CHF (congestive heart failure) ([place])   Type 2 diabetes mellitus with diabetic polyneuropathy, without long-term current use of insulin (HCC)   Closed left hip fracture (HCC)   Acute blood loss anemia   Thrombocytopenia (HCC)  Patient will undergo intramedullary fixation for a left intertrochanteric hip fracture tomorrow.  I reviewed the details of the operation with the patient and  her brother.  I have also called the patient's son by phone and spoke with him at length today regarding the patient's fracture, surgery and the postoperative course.  We discussed risks and benefits of surgery as well.    Thornton Park , MD 10/11/2020, 2:39 PM

## 2020-10-11 NOTE — Progress Notes (Addendum)
PROGRESS NOTE    Jodi Reeves  OVZ:[id number] DOB: 1928/03/08 DOA: 10/10/2020 PCP: [name], Nino Glow, MD   Chief complaint.  Hip pain. Brief Narrative:  Jodi Reeves is a 87 y.o. female with medical history significant for multiple medical problems that are mentioned below, including atrial fibrillation with anticoagulation with Xarelto, who presented to the emergency room with acute onset of left hip pain.  The patient was turning off the light switch when she stepped back and lost her balance and fell on the floor.  She sustained left hip fracture.  She has been seen by Dr. Mack Guise, planning for hip surgery.   Assessment & Plan:   Active Problems:   Hypothyroidism   Essential hypertension   Permanent atrial fibrillation (HCC)   Chronic diastolic CHF (congestive heart failure) ([place])   Type 2 diabetes mellitus with diabetic polyneuropathy, without long-term current use of insulin ([place])   Closed left hip fracture ([place])  #1.  Left hip fracture. Acute blood loss anemia from fracture. Patient hemoglobin dropped down to 7.8, patient is scheduled for surgery tomorrow.  Anticipating further bleeding, will give 1 unit PRBC transfusion. Added to hydrocodone and a IV morphine as needed for pain control.  2.  Chronic diastolic congestive heart failure. No evidence of exacerbation.  We will give IV Lasix after blood transfusion.  3.  Permanent atrial fibrillation. Hold off anticoagulation.  4.  Type 2 diabetes. Continue sliding scale insulin.    DVT prophylaxis: SCDs Code Status: full Family Communication:  Disposition Plan:    Status is: Inpatient  Remains inpatient appropriate because:Inpatient level of care appropriate due to severity of illness  Dispo: The patient is from: Home              Anticipated d/c is to: SNF              Patient currently is not medically stable to d/c.   Difficult to place patient No        I/O last 3 completed shifts: In:  900 [I.V.:900] Out: 1000 [Urine:1000] No intake/output data recorded.     Consultants:  Orthopedics  Procedures: Pending  Antimicrobials: None   Subjective: Patient still has significant pain in the left hip, worse with movement. Denies any short of breath or cough. No chest pain palpitation. No abdominal pain or nausea vomiting. No fever or chills.  Objective: Vitals:   10/10/20 1944 10/10/20 2046 10/11/20 0548 10/11/20 0809  BP: (!) 111/51 116/67 99/61 (!) 143/72  Pulse: 87 83 97 93  Resp: 18 17 16 16   Temp: 98.9 F (37.2 C) 98.8 F (37.1 C) 98.5 F (36.9 C) 98.5 F (36.9 C)  TempSrc: Oral Oral Oral Oral  SpO2: 98% 100% 98% 98%  Weight:      Height:        Intake/Output Summary (Last 24 hours) at 10/11/2020 1210 Last data filed at 10/11/2020 0548 Gross per 24 hour  Intake 900 ml  Output 1000 ml  Net -100 ml   Filed Weights   10/10/20 0230  Weight: 62.2 kg    Examination:  General exam: Appears calm and comfortable  Respiratory system: Clear to auscultation. Respiratory effort normal. Cardiovascular system: S1 & S2 heard, RRR. No JVD, murmurs, rubs, gallops or clicks. No pedal edema. Gastrointestinal system: Abdomen is nondistended, soft and nontender. No organomegaly or masses felt. Normal bowel sounds heard. Central nervous system: Alert and oriented x3. No focal neurological deficits. Extremities: Symmetric 5 x 5 power. Skin:  No rashes, lesions or ulcers Psychiatry: Judgement and insight appear normal. Mood & affect appropriate.     Data Reviewed: I have personally reviewed following labs and imaging studies  CBC: Recent Labs  Lab 10/08/20 1848 10/10/20 0321 10/11/20 0454  WBC 5.6 9.4 9.0  NEUTROABS 3.5 8.1*  --   HGB 11.9* 10.8* 7.8*  HCT 36.6 33.1* 24.0*  MCV 103.4* 104.4* 105.3*  PLT 196 170 400*   Basic Metabolic Panel: Recent Labs  Lab 10/08/20 1848 10/10/20 0321 10/11/20 0454  NA 134* 138 137  K 3.7 3.7 4.4  CL 101 106 108   CO2 29 23 24   GLUCOSE 167* 253* 165*  BUN 18 19 15   CREATININE 0.62 0.57 0.56  CALCIUM 8.6* 8.2* 7.6*  MG  --   --  2.0   GFR: Estimated Creatinine Clearance: 37.9 mL/min (by C-G formula based on SCr of 0.56 mg/dL). Liver Function Tests: Recent Labs  Lab 10/08/20 1848  AST 21  ALT 10  ALKPHOS 62  BILITOT 0.6  PROT 7.6  ALBUMIN 3.9   No results for input(s): LIPASE, AMYLASE in the last 168 hours. No results for input(s): AMMONIA in the last 168 hours. Coagulation Profile: Recent Labs  Lab 10/10/20 0321  INR 2.7*   Cardiac Enzymes: No results for input(s): CKTOTAL, CKMB, CKMBINDEX, TROPONINI in the last 168 hours. BNP (last 3 results) No results for input(s): PROBNP in the last 8760 hours. HbA1C: No results for input(s): HGBA1C in the last 72 hours. CBG: Recent Labs  Lab 10/10/20 1226 10/10/20 1716 10/10/20 2126 10/11/20 0748 10/11/20 1127  GLUCAP 165* 135* 161* 162* 167*   Lipid Profile: No results for input(s): CHOL, HDL, LDLCALC, TRIG, CHOLHDL, LDLDIRECT in the last 72 hours. Thyroid Function Tests: No results for input(s): TSH, T4TOTAL, FREET4, T3FREE, THYROIDAB in the last 72 hours. Anemia Panel: No results for input(s): VITAMINB12, FOLATE, FERRITIN, TIBC, IRON, RETICCTPCT in the last 72 hours. Sepsis Labs: No results for input(s): PROCALCITON, LATICACIDVEN in the last 168 hours.  Recent Results (from the past 240 hour(s))  Resp Panel by RT-PCR (Flu A&B, Covid) Nasopharyngeal Swab     Status: None   Collection Time: 10/10/20  3:21 AM   Specimen: Nasopharyngeal Swab; Nasopharyngeal(NP) swabs in vial transport medium  Result Value Ref Range Status   SARS Coronavirus 2 by RT PCR NEGATIVE NEGATIVE Final    Comment: (NOTE) SARS-CoV-2 target nucleic acids are NOT DETECTED.  The SARS-CoV-2 RNA is generally detectable in upper respiratory specimens during the acute phase of infection. The lowest concentration of SARS-CoV-2 viral copies this assay can  detect is 138 copies/mL. A negative result does not preclude SARS-Cov-2 infection and should not be used as the sole basis for treatment or other patient management decisions. A negative result may occur with  improper specimen collection/handling, submission of specimen other than nasopharyngeal swab, presence of viral mutation(s) within the areas targeted by this assay, and inadequate number of viral copies(<138 copies/mL). A negative result must be combined with clinical observations, patient history, and epidemiological information. The expected result is Negative.  Fact Sheet for Patients:  EntrepreneurPulse.com.au  Fact Sheet for Healthcare Providers:  IncredibleEmployment.be  This test is no t yet approved or cleared by the [place] FDA and  has been authorized for detection and/or diagnosis of SARS-CoV-2 by FDA under an Emergency Use Authorization (EUA). This EUA will remain  in effect (meaning this test can be used) for the duration of the COVID-19 declaration under Section  564(b)(1) of the Act, 21 U.S.C.section 360bbb-3(b)(1), unless the authorization is terminated  or revoked sooner.       Influenza A by PCR NEGATIVE NEGATIVE Final   Influenza B by PCR NEGATIVE NEGATIVE Final    Comment: (NOTE) The Xpert Xpress SARS-CoV-2/FLU/RSV plus assay is intended as an aid in the diagnosis of influenza from Nasopharyngeal swab specimens and should not be used as a sole basis for treatment. Nasal washings and aspirates are unacceptable for Xpert Xpress SARS-CoV-2/FLU/RSV testing.  Fact Sheet for Patients: EntrepreneurPulse.com.au  Fact Sheet for Healthcare Providers: IncredibleEmployment.be  This test is not yet approved or cleared by the [place] FDA and has been authorized for detection and/or diagnosis of SARS-CoV-2 by FDA under an Emergency Use Authorization (EUA). This EUA will remain in  effect (meaning this test can be used) for the duration of the COVID-19 declaration under Section 564(b)(1) of the Act, 21 U.S.C. section 360bbb-3(b)(1), unless the authorization is terminated or revoked.  Performed at [facility], [address], [place], [place] [address]   Surgical PCR screen     Status: None   Collection Time: 10/10/20 11:20 PM   Specimen: Nasal Mucosa; Nasal Swab  Result Value Ref Range Status   MRSA, PCR NEGATIVE NEGATIVE Final   Staphylococcus aureus NEGATIVE NEGATIVE Final    Comment: (NOTE) The Xpert SA Assay (FDA approved for NASAL specimens in patients 59 years of age and older), is one component of a comprehensive surveillance program. It is not intended to diagnose infection nor to guide or monitor treatment. Performed at [facility], [address], [place], [place] [address]          Radiology Studies: DG Elbow 2 Views Left  Result Date: 10/10/2020 CLINICAL DATA:  Tripped and fell EXAM: LEFT ELBOW - 2 VIEW COMPARISON:  09/24/2014 FINDINGS: Frontal and lateral views of the left elbow demonstrate a minimally displaced intra-articular fracture involving the lateral aspect of the radial head. Large joint effusion. Prominent dorsal soft tissue swelling of the proximal forearm. IMPRESSION: 1. Minimally displaced intra-articular fracture lateral aspect radial head. 2. Large joint effusion. 3. Prominent dorsal soft tissue swelling. Electronically Signed   By: Randa Ngo M.D.   On: 10/10/2020 03:08   CT Head Wo Contrast  Result Date: 10/10/2020 CLINICAL DATA:  Fall EXAM: CT HEAD WITHOUT CONTRAST CT CERVICAL SPINE WITHOUT CONTRAST TECHNIQUE: Multidetector CT imaging of the head and cervical spine was performed following the standard protocol without intravenous contrast. Multiplanar CT image reconstructions of the cervical spine were also generated. COMPARISON:  10/08/2020 FINDINGS: CT HEAD FINDINGS Brain: There is no mass, hemorrhage  or extra-axial collection. There is generalized atrophy without lobar predilection. There is hypoattenuation of the periventricular white matter, most commonly indicating chronic ischemic microangiopathy. Vascular: Atherosclerotic calcification of the vertebral and internal carotid arteries at the skull base. No abnormal hyperdensity of the major intracranial arteries or dural venous sinuses. Skull: The visualized skull base, calvarium and extracranial soft tissues are normal. Sinuses/Orbits: No fluid levels or advanced mucosal thickening of the visualized paranasal sinuses. No mastoid or middle ear effusion. The orbits are normal. CT CERVICAL SPINE FINDINGS Alignment: No static subluxation. Facets are aligned. Occipital condyles are normally positioned. Skull base and vertebrae: No acute fracture. Soft tissues and spinal canal: No prevertebral fluid or swelling. No visible canal hematoma. Disc levels: No advanced spinal canal or neural foraminal stenosis. Multilevel degenerative change. Upper chest: No pneumothorax, pulmonary nodule or pleural effusion. Other: Normal visualized paraspinal  cervical soft tissues. IMPRESSION: 1. Chronic ischemic microangiopathy and generalized atrophy without acute intracranial abnormality. 2. No acute fracture or static subluxation of the cervical spine. Electronically Signed   By: Ulyses Jarred M.D.   On: 10/10/2020 03:23   CT Cervical Spine Wo Contrast  Result Date: 10/10/2020 CLINICAL DATA:  Fall EXAM: CT HEAD WITHOUT CONTRAST CT CERVICAL SPINE WITHOUT CONTRAST TECHNIQUE: Multidetector CT imaging of the head and cervical spine was performed following the standard protocol without intravenous contrast. Multiplanar CT image reconstructions of the cervical spine were also generated. COMPARISON:  10/08/2020 FINDINGS: CT HEAD FINDINGS Brain: There is no mass, hemorrhage or extra-axial collection. There is generalized atrophy without lobar predilection. There is hypoattenuation of  the periventricular white matter, most commonly indicating chronic ischemic microangiopathy. Vascular: Atherosclerotic calcification of the vertebral and internal carotid arteries at the skull base. No abnormal hyperdensity of the major intracranial arteries or dural venous sinuses. Skull: The visualized skull base, calvarium and extracranial soft tissues are normal. Sinuses/Orbits: No fluid levels or advanced mucosal thickening of the visualized paranasal sinuses. No mastoid or middle ear effusion. The orbits are normal. CT CERVICAL SPINE FINDINGS Alignment: No static subluxation. Facets are aligned. Occipital condyles are normally positioned. Skull base and vertebrae: No acute fracture. Soft tissues and spinal canal: No prevertebral fluid or swelling. No visible canal hematoma. Disc levels: No advanced spinal canal or neural foraminal stenosis. Multilevel degenerative change. Upper chest: No pneumothorax, pulmonary nodule or pleural effusion. Other: Normal visualized paraspinal cervical soft tissues. IMPRESSION: 1. Chronic ischemic microangiopathy and generalized atrophy without acute intracranial abnormality. 2. No acute fracture or static subluxation of the cervical spine. Electronically Signed   By: Ulyses Jarred M.D.   On: 10/10/2020 03:23   DG Chest Portable 1 View  Result Date: 10/10/2020 CLINICAL DATA:  [place], left hip fracture EXAM: PORTABLE CHEST 1 VIEW COMPARISON:  10/09/2020 FINDINGS: Single frontal view of the chest demonstrates a stable cardiac silhouette. No acute airspace disease, effusion, or pneumothorax. Prior vertebral augmentations at the thoracolumbar junction. Left shoulder arthroplasty. No acute fractures. IMPRESSION: 1. No acute intrathoracic process. Electronically Signed   By: Randa Ngo M.D.   On: 10/10/2020 03:07   DG Hip Unilat W or Wo Pelvis 2-3 Views Left  Result Date: 10/10/2020 CLINICAL DATA:  Tripped and fell, deformity EXAM: DG HIP (WITH OR WITHOUT PELVIS) 2-3V LEFT  COMPARISON:  None. FINDINGS: Frontal view of the pelvis as well as frontal and cross-table lateral views of left hip are obtained. There is a comminuted intertrochanteric left hip fracture with impaction and varus angulation at the fracture site. No dislocation. Remainder of the bony pelvis is unremarkable. Prior L4 vertebral augmentation. IMPRESSION: 1. Comminuted intertrochanteric left hip fracture with varus angulation and impaction. Electronically Signed   By: Randa Ngo M.D.   On: 10/10/2020 03:06        Scheduled Meds:  sodium chloride   Intravenous Once   acidophilus  1 capsule Oral Daily   ascorbic acid  2,000 mg Oral Daily   bisoprolol  5 mg Oral Daily   Chlorhexidine Gluconate Cloth  6 each Topical Daily   vitamin B-12  1,000 mcg Oral Daily   And   folic acid  1 mg Oral Daily   And   vitamin B-6  50 mg Oral Daily   furosemide  20 mg Intravenous Once   furosemide  20 mg Oral BID   insulin aspart  0-15 Units Subcutaneous TID AC & HS  levothyroxine  50 mcg Oral Q0600   linagliptin  5 mg Oral Daily   magnesium oxide  200 mg Oral Daily   multivitamin with minerals   Oral Daily   omega-3 acid ethyl esters  1,000 mg Oral BID   potassium chloride  10 mEq Oral BID   Continuous Infusions:   LOS: 1 day    Time spent: 28 minutes       Sharen Hones, MD [facility]   To contact the attending provider between 7A-7P or the covering provider during after hours 7P-7A, please log into the web site [email or web address] and access using universal [place] password for that web site. If you do not have the password, please call the hospital operator.  10/11/2020, 12:10 PM

## 2020-10-11 NOTE — Progress Notes (Signed)
Dr. Mitchell Heir  made aware of hemoglobin of 7.8 this AM.

## 2020-10-12 ENCOUNTER — Encounter
Admission: EM | Disposition: A | Discharge status: Skilled Nursing Facility | Payer: Self-pay | Source: Home / Self Care | Attending: Obstetrics and Gynecology

## 2020-10-12 ENCOUNTER — Inpatient Hospital Stay: Payer: Medicare HMO | Admitting: Anesthesiology

## 2020-10-12 ENCOUNTER — Inpatient Hospital Stay: Payer: Medicare HMO

## 2020-10-12 ENCOUNTER — Encounter: Payer: Self-pay | Admitting: Family Medicine

## 2020-10-12 DIAGNOSIS — I10 Essential (primary) hypertension: Secondary | ICD-10-CM | POA: Diagnosis not present

## 2020-10-12 DIAGNOSIS — I5032 Chronic diastolic (congestive) heart failure: Secondary | ICD-10-CM

## 2020-10-12 DIAGNOSIS — I4821 Permanent atrial fibrillation: Secondary | ICD-10-CM | POA: Diagnosis not present

## 2020-10-12 DIAGNOSIS — Z01810 Encounter for preprocedural cardiovascular examination: Secondary | ICD-10-CM

## 2020-10-12 HISTORY — PX: INTRAMEDULLARY (IM) NAIL INTERTROCHANTERIC: SHX5875

## 2020-10-12 LAB — CBC WITH DIFFERENTIAL/PLATELET
Abs Immature Granulocytes: 0.19 10*3/uL — ABNORMAL HIGH (ref 0.00–0.07)
Basophils Absolute: 0 10*3/uL (ref 0.0–0.1)
Basophils Relative: 0 %
Eosinophils Absolute: 0 10*3/uL (ref 0.0–0.5)
Eosinophils Relative: 0 %
HCT: 29 % — ABNORMAL LOW (ref 36.0–46.0)
Hemoglobin: 9.8 g/dL — ABNORMAL LOW (ref 12.0–15.0)
Immature Granulocytes: 2 %
Lymphocytes Relative: 10 %
Lymphs Abs: 1.3 10*3/uL (ref 0.7–4.0)
MCH: 34.1 pg — ABNORMAL HIGH (ref 26.0–34.0)
MCHC: 33.8 g/dL (ref 30.0–36.0)
MCV: 101 fL — ABNORMAL HIGH (ref 80.0–100.0)
Monocytes Absolute: 1.2 10*3/uL — ABNORMAL HIGH (ref 0.1–1.0)
Monocytes Relative: 9 %
Neutro Abs: 10.3 10*3/uL — ABNORMAL HIGH (ref 1.7–7.7)
Neutrophils Relative %: 79 %
Platelets: 137 10*3/uL — ABNORMAL LOW (ref 150–400)
RBC: 2.87 MIL/uL — ABNORMAL LOW (ref 3.87–5.11)
RDW: 14.6 % (ref 11.5–15.5)
WBC: 13 10*3/uL — ABNORMAL HIGH (ref 4.0–10.5)
nRBC: 0.2 % (ref 0.0–0.2)

## 2020-10-12 LAB — BASIC METABOLIC PANEL
Anion gap: 8 (ref 5–15)
BUN: 15 mg/dL (ref 8–23)
CO2: 25 mmol/L (ref 22–32)
Calcium: 7.8 mg/dL — ABNORMAL LOW (ref 8.9–10.3)
Chloride: 104 mmol/L (ref 98–111)
Creatinine, Ser: 0.61 mg/dL (ref 0.44–1.00)
GFR, Estimated: >60 mL/min (ref >60)
Glucose, Bld: 202 mg/dL — ABNORMAL HIGH (ref 70–99)
Potassium: 3.7 mmol/L (ref 3.5–5.1)
Sodium: 137 mmol/L (ref 135–145)

## 2020-10-12 LAB — TYPE AND SCREEN
ABO/RH(D): A POS
Antibody Screen: NEGATIVE
Unit division: 0

## 2020-10-12 LAB — GLUCOSE, CAPILLARY
Glucose-Capillary: 176 mg/dL — ABNORMAL HIGH (ref 70–99)
Glucose-Capillary: 208 mg/dL — ABNORMAL HIGH (ref 70–99)
Glucose-Capillary: 148 mg/dL — ABNORMAL HIGH (ref 70–99)
Glucose-Capillary: 111 mg/dL — ABNORMAL HIGH (ref 70–99)
Glucose-Capillary: 202 mg/dL — ABNORMAL HIGH (ref 70–99)
Glucose-Capillary: 234 mg/dL — ABNORMAL HIGH (ref 70–99)

## 2020-10-12 LAB — BPAM RBC
Blood Product Expiration Date: 202207202359
ISSUE DATE / TIME: 202206201253
Unit Type and Rh: 6200

## 2020-10-12 LAB — MAGNESIUM: Magnesium: 2 mg/dL (ref 1.7–2.4)

## 2020-10-12 SURGERY — FIXATION, FRACTURE, INTERTROCHANTERIC, WITH INTRAMEDULLARY ROD
Anesthesia: General | Laterality: Left

## 2020-10-12 MED ORDER — DEXAMETHASONE SODIUM PHOSPHATE 10 MG/ML IJ SOLN
INTRAMUSCULAR | Status: DC | PRN
  Administered 2020-10-12: 10 mg via INTRAVENOUS

## 2020-10-12 MED ORDER — ONDANSETRON HCL 4 MG/2ML IJ SOLN: 4.0000 mg | Freq: Four times a day (QID) | INTRAMUSCULAR | Status: DC | PRN

## 2020-10-12 MED ORDER — PROPOFOL 10 MG/ML IV BOLUS
INTRAVENOUS | Status: AC
  Filled 2020-10-12: qty 20

## 2020-10-12 MED ORDER — POLYETHYLENE GLYCOL 3350 17 G PO PACK: 17.0000 g | PACK | Freq: Every day | ORAL | Status: DC | PRN

## 2020-10-12 MED ORDER — ENOXAPARIN SODIUM 40 MG/0.4ML IJ SOSY
40.0000 mg | PREFILLED_SYRINGE | INTRAMUSCULAR | Status: DC
  Administered 2020-10-13: 40 mg via SUBCUTANEOUS
  Filled 2020-10-12: qty 0.4

## 2020-10-12 MED ORDER — EPHEDRINE 5 MG/ML INJ
INTRAVENOUS | Status: AC
  Filled 2020-10-12: qty 10

## 2020-10-12 MED ORDER — LIDOCAINE HCL (PF) 2 % IJ SOLN
INTRAMUSCULAR | Status: AC
  Filled 2020-10-12: qty 5

## 2020-10-12 MED ORDER — MORPHINE SULFATE (PF) 2 MG/ML IV SOLN
0.5000 mg | INTRAVENOUS | Status: DC | PRN
Start: 2020-10-12 — End: 2020-10-20
  Administered 2020-10-18 (×2): 1 mg via INTRAVENOUS
  Filled 2020-10-12 (×2): qty 1

## 2020-10-12 MED ORDER — CHLORHEXIDINE GLUCONATE 0.12 % MT SOLN
OROMUCOSAL | Status: AC
  Administered 2020-10-12: 15 mL via OROMUCOSAL
  Filled 2020-10-12: qty 15

## 2020-10-12 MED ORDER — CEFAZOLIN SODIUM-DEXTROSE 2-3 GM-%(50ML) IV SOLR
INTRAVENOUS | Status: DC | PRN
  Administered 2020-10-12: 2 g via INTRAVENOUS

## 2020-10-12 MED ORDER — GENTAMICIN SULFATE 40 MG/ML IJ SOLN
INTRAMUSCULAR | Status: AC
  Filled 2020-10-12: qty 2

## 2020-10-12 MED ORDER — GLYCOPYRROLATE 0.2 MG/ML IJ SOLN
INTRAMUSCULAR | Status: DC | PRN
  Administered 2020-10-12: .1 mg via INTRAVENOUS

## 2020-10-12 MED ORDER — FENTANYL CITRATE (PF) 100 MCG/2ML IJ SOLN
INTRAMUSCULAR | Status: DC | PRN
  Administered 2020-10-12: 25 ug via INTRAVENOUS
  Administered 2020-10-12 (×2): 50 ug via INTRAVENOUS

## 2020-10-12 MED ORDER — ACETAMINOPHEN 10 MG/ML IV SOLN
INTRAVENOUS | Status: AC
  Filled 2020-10-12: qty 100

## 2020-10-12 MED ORDER — FENTANYL CITRATE (PF) 100 MCG/2ML IJ SOLN: 25.0000 ug | INTRAMUSCULAR | Status: DC | PRN

## 2020-10-12 MED ORDER — ACETAMINOPHEN 500 MG PO TABS
500.0000 mg | ORAL_TABLET | Freq: Four times a day (QID) | ORAL | Status: AC
  Administered 2020-10-13: 500 mg via ORAL
  Filled 2020-10-12 (×3): qty 1

## 2020-10-12 MED ORDER — SODIUM CHLORIDE 0.9 % IV SOLN: INTRAVENOUS | Status: DC | PRN

## 2020-10-12 MED ORDER — BISOPROLOL FUMARATE 5 MG PO TABS
5.0000 mg | ORAL_TABLET | Freq: Two times a day (BID) | ORAL | Status: DC
  Administered 2020-10-12 – 2020-10-20 (×15): 5 mg via ORAL
  Filled 2020-10-12 (×18): qty 1

## 2020-10-12 MED ORDER — SODIUM CHLORIDE 0.9 % IV SOLN
INTRAVENOUS | Status: DC | PRN
  Administered 2020-10-12: 40 ug/min via INTRAVENOUS

## 2020-10-12 MED ORDER — SUGAMMADEX SODIUM 200 MG/2ML IV SOLN
INTRAVENOUS | Status: DC | PRN
  Administered 2020-10-12: 150 mg via INTRAVENOUS

## 2020-10-12 MED ORDER — LIDOCAINE HCL (CARDIAC) PF 100 MG/5ML IV SOSY
PREFILLED_SYRINGE | INTRAVENOUS | Status: DC | PRN
  Administered 2020-10-12: 80 mg via INTRAVENOUS

## 2020-10-12 MED ORDER — VASOPRESSIN 20 UNIT/ML IV SOLN
INTRAVENOUS | Status: DC | PRN
  Administered 2020-10-12: 4 [IU] via INTRAVENOUS
  Administered 2020-10-12 (×2): 2 [IU] via INTRAVENOUS
  Administered 2020-10-12 (×2): 4 [IU] via INTRAVENOUS

## 2020-10-12 MED ORDER — CEFAZOLIN SODIUM-DEXTROSE 1-4 GM/50ML-% IV SOLN
1.0000 g | Freq: Four times a day (QID) | INTRAVENOUS | Status: AC
  Administered 2020-10-12 – 2020-10-13 (×2): 1 g via INTRAVENOUS
  Filled 2020-10-12 (×2): qty 50

## 2020-10-12 MED ORDER — ALUM & MAG HYDROXIDE-SIMETH 200-200-20 MG/5ML PO SUSP: 30.0000 mL | ORAL | Status: DC | PRN

## 2020-10-12 MED ORDER — ONDANSETRON HCL 4 MG/2ML IJ SOLN
INTRAMUSCULAR | Status: AC
  Filled 2020-10-12: qty 2

## 2020-10-12 MED ORDER — PHENYLEPHRINE HCL (PRESSORS) 10 MG/ML IV SOLN
INTRAVENOUS | Status: AC
  Administered 2020-10-12: 100 ug
  Filled 2020-10-12: qty 1

## 2020-10-12 MED ORDER — FENTANYL CITRATE (PF) 100 MCG/2ML IJ SOLN
INTRAMUSCULAR | Status: AC
  Filled 2020-10-12: qty 2

## 2020-10-12 MED ORDER — SENNA 8.6 MG PO TABS
1.0000 | ORAL_TABLET | Freq: Two times a day (BID) | ORAL | Status: DC
  Administered 2020-10-12 – 2020-10-20 (×16): 8.6 mg via ORAL
  Filled 2020-10-12 (×16): qty 1

## 2020-10-12 MED ORDER — ROCURONIUM BROMIDE 100 MG/10ML IV SOLN
INTRAVENOUS | Status: DC | PRN
  Administered 2020-10-12 (×2): 10 mg via INTRAVENOUS
  Administered 2020-10-12: 40 mg via INTRAVENOUS

## 2020-10-12 MED ORDER — TRAMADOL HCL 50 MG PO TABS
50.0000 mg | ORAL_TABLET | Freq: Four times a day (QID) | ORAL | Status: DC
  Administered 2020-10-16 – 2020-10-20 (×9): 50 mg via ORAL
  Filled 2020-10-12 (×16): qty 1

## 2020-10-12 MED ORDER — BISACODYL 10 MG RE SUPP: 10.0000 mg | Freq: Every day | RECTAL | Status: DC | PRN

## 2020-10-12 MED ORDER — EPHEDRINE SULFATE 50 MG/ML IJ SOLN
INTRAMUSCULAR | Status: DC | PRN
  Administered 2020-10-12 (×2): 5 mg via INTRAVENOUS

## 2020-10-12 MED ORDER — PROPOFOL 10 MG/ML IV BOLUS
INTRAVENOUS | Status: DC | PRN
  Administered 2020-10-12: 110 mg via INTRAVENOUS

## 2020-10-12 MED ORDER — ONDANSETRON HCL 4 MG PO TABS: 4.0000 mg | ORAL_TABLET | Freq: Four times a day (QID) | ORAL | Status: DC | PRN

## 2020-10-12 MED ORDER — DOCUSATE SODIUM 100 MG PO CAPS
100.0000 mg | ORAL_CAPSULE | Freq: Two times a day (BID) | ORAL | Status: DC
  Administered 2020-10-12 – 2020-10-20 (×15): 100 mg via ORAL
  Filled 2020-10-12 (×16): qty 1

## 2020-10-12 MED ORDER — CHLORHEXIDINE GLUCONATE 0.12 % MT SOLN: 15.0000 mL | Freq: Once | OROMUCOSAL | Status: AC

## 2020-10-12 MED ORDER — ROCURONIUM BROMIDE 10 MG/ML (PF) SYRINGE
PREFILLED_SYRINGE | INTRAVENOUS | Status: AC
  Filled 2020-10-12: qty 10

## 2020-10-12 MED ORDER — HYDROCODONE-ACETAMINOPHEN 5-325 MG PO TABS
1.0000 | ORAL_TABLET | ORAL | Status: DC | PRN
  Administered 2020-10-15: 1 via ORAL
  Administered 2020-10-16 (×2): 2 via ORAL
  Administered 2020-10-16 – 2020-10-19 (×2): 1 via ORAL
  Filled 2020-10-12: qty 1
  Filled 2020-10-12: qty 2
  Filled 2020-10-12: qty 1
  Filled 2020-10-12: qty 2
  Filled 2020-10-12: qty 1

## 2020-10-12 MED ORDER — ONDANSETRON HCL 4 MG/2ML IJ SOLN
4.0000 mg | Freq: Once | INTRAMUSCULAR | Status: AC | PRN
  Administered 2020-10-12: 4 mg via INTRAVENOUS

## 2020-10-12 MED ORDER — METOPROLOL TARTRATE 5 MG/5ML IV SOLN
INTRAVENOUS | Status: DC | PRN
  Administered 2020-10-12 (×2): 1 mg via INTRAVENOUS

## 2020-10-12 MED ORDER — ACETAMINOPHEN 10 MG/ML IV SOLN
INTRAVENOUS | Status: DC | PRN
  Administered 2020-10-12: 1000 mg via INTRAVENOUS

## 2020-10-12 SURGICAL SUPPLY — 43 items
BIT DRILL 4.3MMS DISTAL GRDTED (BIT) IMPLANT
BNDG COHESIVE 6X5 TAN STRL LF (GAUZE/BANDAGES/DRESSINGS) ×4 IMPLANT
CANISTER SUCT 1200ML W/VALVE (MISCELLANEOUS) ×2 IMPLANT
COVER WAND RF STERILE (DRAPES) ×2 IMPLANT
DRAPE 3/4 80X56 (DRAPES) ×4 IMPLANT
DRAPE SURG 17X11 SM STRL (DRAPES) ×4 IMPLANT
DRAPE U-SHAPE 47X51 STRL (DRAPES) ×2 IMPLANT
DRILL 4.3MMS DISTAL GRADUATED (BIT) ×2
DRSG OPSITE POSTOP 3X4 (GAUZE/BANDAGES/DRESSINGS) ×4 IMPLANT
DRSG OPSITE POSTOP 4X14 (GAUZE/BANDAGES/DRESSINGS) IMPLANT
DRSG OPSITE POSTOP 4X6 (GAUZE/BANDAGES/DRESSINGS) ×2 IMPLANT
DURAPREP 26ML APPLICATOR (WOUND CARE) ×4 IMPLANT
ELECT REM PT RETURN 9FT ADLT (ELECTROSURGICAL) ×2
ELECTRODE REM PT RTRN 9FT ADLT (ELECTROSURGICAL) ×1 IMPLANT
GLOVE SURG ORTHO LTX SZ9 (GLOVE) ×4 IMPLANT
GLOVE SURG UNDER POLY LF SZ9 (GLOVE) ×2 IMPLANT
GOWN STRL REUS TWL 2XL XL LVL4 (GOWN DISPOSABLE) ×2 IMPLANT
GOWN STRL REUS W/ TWL LRG LVL3 (GOWN DISPOSABLE) ×1 IMPLANT
GOWN STRL REUS W/TWL LRG LVL3 (GOWN DISPOSABLE) ×2
GUIDEPIN VERSANAIL DSP 3.2X444 (ORTHOPEDIC DISPOSABLE SUPPLIES) ×1 IMPLANT
GUIDEWIRE BALL NOSE 80CM (WIRE) ×1 IMPLANT
HEMOVAC 400CC 10FR (MISCELLANEOUS) IMPLANT
HFN LAG SCREW 10.5MM X 115MM (Orthopedic Implant) ×1 IMPLANT
HIP FRAC NAIL LEFT 11X360MM (Orthopedic Implant) ×2 IMPLANT
KIT TURNOVER CYSTO (KITS) ×2 IMPLANT
MANIFOLD NEPTUNE II (INSTRUMENTS) ×2 IMPLANT
MAT ABSORB  FLUID 56X50 GRAY (MISCELLANEOUS) ×2
MAT ABSORB FLUID 56X50 GRAY (MISCELLANEOUS) ×1 IMPLANT
NAIL HIP FRAC LEFT 11X360MM (Orthopedic Implant) ×1 IMPLANT
NS IRRIG 1000ML POUR BTL (IV SOLUTION) ×2 IMPLANT
PACK HIP COMPR (MISCELLANEOUS) ×2 IMPLANT
PAD ARMBOARD 7.5X6 YLW CONV (MISCELLANEOUS) ×2 IMPLANT
PREVENA INCISION MGT 90 150 (MISCELLANEOUS) ×2 IMPLANT
SCREW BONE CORTICAL 5.0X40 (Screw) ×1 IMPLANT
SCREW BONE CORTICAL 5.0X42 (Screw) ×2 IMPLANT
STAPLER SKIN PROX 35W (STAPLE) ×2 IMPLANT
SUCTION FRAZIER HANDLE 10FR (MISCELLANEOUS) ×2
SUCTION TUBE FRAZIER 10FR DISP (MISCELLANEOUS) ×1 IMPLANT
SUT VIC AB 0 CT1 36 (SUTURE) ×4 IMPLANT
SUT VIC AB 2-0 CT1 27 (SUTURE) ×2
SUT VIC AB 2-0 CT1 TAPERPNT 27 (SUTURE) ×1 IMPLANT
SUT VICRYL 0 AB UR-6 (SUTURE) ×2 IMPLANT
SYR 30ML LL (SYRINGE) ×2 IMPLANT

## 2020-10-12 NOTE — Transfer of Care (Signed)
Immediate Anesthesia Transfer of Care Note  Patient: Jodi Reeves  Procedure(s) Performed: INTRAMEDULLARY (IM) NAIL INTERTROCHANTRIC (Left)  Patient Location: PACU  Anesthesia Type:General  Level of Consciousness: sedated and patient cooperative  Airway & Oxygen Therapy: Patient connected to face mask oxygen  Post-op Assessment: Post -op Vital signs reviewed and stable  Post vital signs: Reviewed and stable  Last Vitals:  Vitals Value Taken Time  BP 116/70 10/12/20 1848  Temp    Pulse 104 10/12/20 1851  Resp 16 10/12/20 1851  SpO2 100 % 10/12/20 1851  Vitals shown include unvalidated device data.  Last Pain:  Vitals:   10/12/20 1429  TempSrc: Oral  PainSc: 4       Patients Stated Pain Goal: 4 (82/50/03 7048)  Complications: No notable events documented.

## 2020-10-12 NOTE — Addendum Note (Signed)
Addendum  created 10/12/20 1903 by Gunnar Fusi, MD   Order list changed, Order sets accessed, Pharmacy for encounter modified

## 2020-10-12 NOTE — Anesthesia Procedure Notes (Signed)
Procedure Name: Intubation Date/Time: 10/12/2020 4:16 PM Performed by: Kelton Pillar, CRNA Pre-anesthesia Checklist: Patient identified, Emergency Drugs available, Suction available and Patient being monitored Patient Re-evaluated:Patient Re-evaluated prior to induction Oxygen Delivery Method: Circle system utilized Preoxygenation: Pre-oxygenation with 100% oxygen Induction Type: IV induction Ventilation: Mask ventilation without difficulty Laryngoscope Size: McGraph and 3 Grade View: Grade I Tube type: Oral Tube size: 6.5 mm Number of attempts: 1 Airway Equipment and Method: Stylet and Oral airway Placement Confirmation: ETT inserted through vocal cords under direct vision, positive ETCO2, breath sounds checked- equal and bilateral and CO2 detector Secured at: 21 cm Tube secured with: Tape Dental Injury: Teeth and Oropharynx as per pre-operative assessment

## 2020-10-12 NOTE — Anesthesia Preprocedure Evaluation (Signed)
Anesthesia Evaluation  Patient identified by MRN, date of birth, ID band Patient awake  General Assessment Comment: Last took xarelto Saturday 9pm (less than 72hrs)  Reviewed: Allergy & Precautions, NPO status , Patient's Chart, lab work & pertinent test results  History of Anesthesia Complications Negative for: history of anesthetic complications  Airway Mallampati: III  TM Distance: >3 FB Neck ROM: Full    Dental  (+) Teeth Intact, Poor Dentition   Pulmonary neg pulmonary ROS, neg sleep apnea, neg COPD, Patient abstained from smoking.Not current smoker,    Pulmonary exam normal breath sounds clear to auscultation       Cardiovascular Exercise Tolerance: Poor METShypertension, + CAD, + Peripheral Vascular Disease and +CHF  (-) Past MI + dysrhythmias Atrial Fibrillation + Valvular Problems/Murmurs  Rhythm:Irregular Rate:Normal - Systolic murmurs 1. The left ventricle has normal systolic function, with an ejection  fraction of 55-60%. The cavity size was normal. Left ventricular diastolic  Doppler parameters are consistent with impaired relaxation.  2. The right ventricle has normal systolic function. The cavity was  normal. There is no increase in right ventricular wall thickness. Right  ventricular systolic pressure is moderately elevated with an estimated  pressure of 45.8 mmHg.  3. Tricuspid valve regurgitation is mild-moderate.  4. Left atrial size was moderately dilated.  5. Rhythm is atrial fibrillation    Neuro/Psych PSYCHIATRIC DISORDERS Anxiety TIA Neuromuscular disease    GI/Hepatic GERD  ,(+)     (-) substance abuse  ,   Endo/Other  diabetesHypothyroidism   Renal/GU negative Renal ROS     Musculoskeletal   Abdominal   Peds  Hematology   Anesthesia Other Findings Past Medical History: No date: Allergy No date: Anxiety No date: Balance disorder No date: Chronic diastolic CHF (congestive heart  failure) (HCC) No date: Colon polyps No date: Coronary artery disease, non-occlusive     Comment:  a. LHC 11/2003: 10% LAD stenosis, 20% LCx stenosis, and               40% RCA stenosis; b. nuc stress test 12/15: small region               of mild ischemia in the apical region with WMA also noted              in the apical region, EF 60%. She declined invasive               evaluation at that time No date: DM2 (diabetes mellitus, type 2) ([place]) No date: Fall     Comment:  04/2017 see careeverywhere [facility] multiple fractures,               brusing  No date: Falls frequently     Comment:  h/o left shoudler/wrist fracture and left fingers numb No date: GERD (gastroesophageal reflux disease)     Comment:  esophageal web No date: Granuloma annulare     Comment:  [name]. since 2010 No date: History of chicken pox No date: History of eating disorder No date: HLD (hyperlipidemia) No date: HTN (hypertension) No date: Hypothyroidism No date: IBS (irritable bowel syndrome)     Comment:  diarrhea  No date: Incontinence of bowel No date: Osteoporosis No date: Ovarian cancer ([place])     Comment:  1980 No date: Persistent atrial fibrillation ([place])     Comment:  a. CHADS2VASc = > 8 (CHF, HTN, age x 2, DM, TIA x 2,  female); b. on Xarelto No date: SBO (small bowel obstruction) ([place])     Comment:  1998 No date: TIA (transient ischemic attack) No date: UTI (urinary tract infection) No date: Venous insufficiency  Reproductive/Obstetrics                            Anesthesia Physical  Anesthesia Plan  ASA: 3  Anesthesia Plan: General   Post-op Pain Management:    Induction: Intravenous  PONV Risk Score and Plan: 4 or greater and Ondansetron, Dexamethasone and Treatment may vary due to age or medical condition  Airway Management Planned: Oral ETT and LMA  Additional Equipment: None  Intra-op Plan:   Post-operative Plan: Extubation in  OR  Informed Consent: I have reviewed the patients History and Physical, chart, labs and discussed the procedure including the risks, benefits and alternatives for the proposed anesthesia with the patient or authorized representative who has indicated his/her understanding and acceptance.     Dental advisory given  Plan Discussed with: CRNA and Surgeon  Anesthesia Plan Comments: (Discussed risks of anesthesia with patient with son at bedside, including possibility of difficulty with spontaneous ventilation under anesthesia necessitating airway intervention, PONV, Post operative cognitive dysfunction  and rare risks such as cardiac or respiratory or neurological events. Patient understands.)       Anesthesia Quick Evaluation                                  Anesthesia Evaluation  Patient identified by MRN, date of birth, ID band Patient awake    Reviewed: Allergy & Precautions, NPO status , Patient's Chart, lab work & pertinent test results  History of Anesthesia Complications Negative for: history of anesthetic complications  Airway Mallampati: III  TM Distance: >3 FB Neck ROM: Full    Dental   [place] :   Pulmonary neg pulmonary ROS,    Pulmonary exam normal breath sounds clear to auscultation       Cardiovascular hypertension, + CAD and +CHF  + dysrhythmias (a fib on xarelto)  Rhythm:Irregular Rate:Normal  ECG 08/21/17: atrial fibrillation; left axis deviation; septal infarct, age undetermined  Myocardial perfusion 04/06/17:   Horizontal ST segment depression ST segment depression of 1 mm was noted during stress in the II, III and aVF leads.  The study is normal.  This is a low risk study.  The left ventricular ejection fraction is hyperdynamic (>65%).   Neuro/Psych PSYCHIATRIC DISORDERS Anxiety Depression TIAnegative neurological ROS     GI/Hepatic GERD  ,Esophageal web, hx SBO, IBS   Endo/Other  diabetes, Type 2Hypothyroidism    Renal/GU negative Renal ROS     Musculoskeletal   Abdominal   Peds  Hematology negative hematology ROS (+)   Anesthesia Other Findings Cardiology note 08/21/17:  ASSESSMENT AND PLAN:  Mixed hyperlipidemia  CT scan including mild coronary calcifications, aortic arch calcifications, moderate aortic atherosclerosis in the abdominal aorta  does not want a statin stable  No further work-up  Essential hypertension, benign - Plan: EKG 12-Lead Blood pressure is well controlled on today's visit. No changes made to the medications.  Persistent atrial fibrillation (HCC) Heart rate well controlled,  Tolerating Xarelto, no recent falls No changes made  Dark urine She is concerned about hematuria but no proof of this Recommended if she has any other days with very dark urine that she contact primary care, may need  urinalysis  if she does have confirmation of hematuria would likely benefit from urology but currently reports urine is back to normal color  Chronic diastolic CHF (congestive heart failure) (HCC) Takes Lasix daily, occasionally with extra dose potassium daily Appears euvolemic   Total encounter time more than 45 minutes  Greater than 50% was spent in counseling and coordination of care with the patient  Disposition:   F/U  12 months  Reproductive/Obstetrics                            Anesthesia Physical Anesthesia Plan  ASA: III  Anesthesia Plan: General and Bier Block and Bier Block-LIDOCAINE ONLY   Post-op Pain Management:  [facility] for Post-op pain and GA combined w/ [facility] for post-op pain   Induction: Intravenous  PONV Risk Score and Plan: 3 and Propofol infusion and TIVA  Airway Management Planned: Natural Airway  Additional Equipment:   Intra-op Plan:   Post-operative Plan:   Informed Consent: I have reviewed the patients History and Physical, chart, labs and discussed the procedure including the risks, benefits and  alternatives for the proposed anesthesia with the patient or authorized representative who has indicated his/her understanding and acceptance.     Plan Discussed with: CRNA  Anesthesia Plan Comments:         Anesthesia Quick Evaluation

## 2020-10-12 NOTE — Consult Note (Signed)
Cardiology Consult    Patient ID: Jodi Reeves MRN: [id number], DOB/AGE: 08/05/1927   Admit date: 10/10/2020 Date of Consult: 10/12/2020  Primary Physician: [name], Nino Glow, MD Primary Cardiologist: Ida Rogue, MD Requesting Provider: Elenora Gamma, MD  Patient Profile    Jodi Reeves is a 87 y.o. female with a history of permanent atrial fibrillation on xarelto, HTN, HL, hypothyroidism, DMII, nonobstructive CAD, HFpEF, osteoporosis, falls, compression fractures, chronic pain, venous insuff/lymphedema, and TIA, who is being seen today for the evaluation of Afib w/ RVR and preop cardiovascular eval at the request of Dr. Roosevelt Locks.  Past Medical History   Past Medical History:  Diagnosis Date   Allergy    Anxiety    Balance disorder    Chronic diastolic CHF (congestive heart failure) ([place])    a. 10/2018 Echo: EF 55-60%, diast dysfxn, nl RV fxn. RVSP 45.96mmHg. Mild-mod TR. Mod dil LA.   Colon polyps    Coronary artery disease, non-occlusive    a. LHC 11/2003: 10% LAD stenosis, 20% LCx stenosis, and 40% RCA stenosis; b. nuc stress test 12/15: small region of mild ischemia in the apical region with WMA also noted in the apical region, EF 60%. She declined invasive evaluation at that time   DM2 (diabetes mellitus, type 2) ([place])    Fall    04/2017 see careeverywhere [facility] multiple fractures, brusing    Falls frequently    h/o left shoudler/wrist fracture and left fingers numb   GERD (gastroesophageal reflux disease)    esophageal web   Granuloma annulare    [name]. since 2010   History of chicken pox    History of eating disorder    HLD (hyperlipidemia)    HTN (hypertension)    Hypothyroidism    IBS (irritable bowel syndrome)    diarrhea    Incontinence of bowel    Osteopenia    Osteoporosis    Ovarian cancer ([place])    1980   Persistent atrial fibrillation ([place])    a. CHADS2VASc = > 8 (CHF, HTN, age x 2, DM, TIA x 2, female); b. on Xarelto   SBO (small bowel  obstruction) ([place])    1998   TIA (transient ischemic attack)    UTI (urinary tract infection)    Venous insufficiency    Ventral hernia     Past Surgical History:  Procedure Laterality Date   2nd look laparotomy  1982   ABDOMINAL HYSTERECTOMY     for ovarian cancer s/p hysterectomy total 1976 and exp lap [place]  8/05   neg; a fib found    CARPAL TUNNEL RELEASE Left 03/06/2018   Procedure: CARPAL TUNNEL RELEASE ENDOSCOPIC;  Surgeon: Corky Mull, MD;  Location: [facility];  Service: Orthopedics;  Laterality: Left;  diabetic - oral meds   CARPAL TUNNEL RELEASE     left CTS Dr.Poggi   cataract OD  2002   cataract surgery  5/07   R   CHOLECYSTECTOMY  1986   DEXA  9/04 and 1/02   EGD/dilation/colon  1/06   ESOPHAGOGASTRODUODENOSCOPY (EGD) WITH PROPOFOL N/A 05/16/2016   Procedure: ESOPHAGOGASTRODUODENOSCOPY (EGD) WITH PROPOFOL with dilation;  Surgeon: Lucilla Lame, MD;  Location: [facility];  Service: Endoscopy;  Laterality: N/A;   FEMORAL HERNIA REPAIR  1958   R   fracture L elbow/wrist  2000   intussception/obstruction  12/98   JOINT REPLACEMENT  left shoulder   kidney stone x2  1991   KYPHOPLASTY N/A 04/13/2020   Procedure: [id number];  Surgeon: Hessie Knows, MD;  Location: [facility];  Service: Orthopedics;  Laterality: N/A;   KYPHOPLASTY N/A 05/18/2020   Procedure: L1 KYPHOPLASTY;  Surgeon: Hessie Knows, MD;  Location: [facility];  Service: Orthopedics;  Laterality: N/A;   KYPHOPLASTY N/A 07/06/2020   Procedure: L4  KYPHOPLASTY;  Surgeon: Hessie Knows, MD;  Location: [facility];  Service: Orthopedics;  Laterality: N/A;   laminectomy L4-5  1971   LUMBAR LAMINECTOMY     1970 ruptured disc    MOUTH SURGERY     myoview stress  8/05   syncope (-)   REVERSE SHOULDER ARTHROPLASTY Left 10/06/2014   Procedure: REVERSE SHOULDER ARTHROPLASTY;  Surgeon: Corky Mull, MD;  Location: [facility];  Service:  Orthopedics;  Laterality: Left;   stillbirth  1969   TOTAL ABDOMINAL HYSTERECTOMY W/ BILATERAL SALPINGOOPHORECTOMY  1980   ovarian cancer   WRIST FRACTURE SURGERY  11/05   R     Allergies  Allergies  Allergen Reactions   Amiodarone Other (See Comments)    Severe Thyroid issues    Fosamax [Alendronate]     dysphagia   Penicillins Anaphylaxis and Other (See Comments)    TOLERATED CEFAZOLIN 07/06/20 Has patient had a PCN reaction causing immediate rash, facial/tongue/throat swelling, SOB or lightheadedness with hypotension: Yes Has patient had a PCN reaction causing severe rash involving mucus membranes or skin necrosis: No Has patient had a PCN reaction that required hospitalization: No Has patient had a PCN reaction occurring within the last 10 years: No If all of the above answers are "NO", then may proceed with Cephalosporin use.    Sulfa Antibiotics Itching and Rash    Itching    Actos [Pioglitazone]     Not effective    Amaryl [Glimepiride]     Not effective     Atorvastatin Other (See Comments)    Muscle aches & All statins per Patient   Bentyl [Dicyclomine Hcl]     Could not tolerate nausea, reduced concentration, h/a    Ciprofloxacin Other (See Comments)    High blood pressure     Codeine Nausea And Vomiting   Ezetimibe-Simvastatin Other (See Comments)    Muscle aches, nausea, back pain    Lovastatin Other (See Comments)    Myalgias   Macrobid [Nitrofurantoin Macrocrystal]     Severe Itching, rash    Protonix [Pantoprazole Sodium]     Esophageal problems  Wants removed from list   Rosiglitazone Maleate Other (See Comments)    Edema   Statins     Muscle and joint aches to all statins   Victoza [Liraglutide]     Nausea    Alendronate Sodium Rash    Dysphagia and ulceration    Bactrim [Sulfamethoxazole-Trimethoprim] Rash    itching    History of Present Illness    87 y.o. female with a history of permanent atrial fibrillation on xarelto, HTN, HL,  hypothyroidism, DMII, nonobstructive CAD, HFpEF, osteoporosis, falls, compression fractures, chronic pain, venous insuff/lymphedema, and TIA.  Prior cath in 2005 w/ nonobs CAD.  Stress testing in 2015 notable for a small apical perfusion defect.  She has been medically managed since.  Most recent echo in 10/2018 showed nl LVEF w/ diast dysfxn and PAH (RVSP 45.43mmHg).  She hsa been maintained in rate controlled afib on ? blocker therapy and anticoagulated w/ xarelto.  She does note intermittent chest pain, though  this typically occurs in the context of somewhat chronic LLQ abdominal pian that radiates up to her epigastric and upper chest area.  There is no assoc dyspnea, and she denies exertional symptoms.  On 6/17, Ms. Bury was seen in the ED after awakening w/ headache and R facial droop/wkns.  ED eval was unremarkable and she was d/c'd home.  Ms. Yust was in her USOH in the early morning hours of 6/19, when she was @ home and had just turned off a light switch, when upon turning, she got tripped up and fell to the floor w/ resultant L hip/back/leg pain.  She did not lose consciousness.  She was taken to [facility] ED and found to have a comminuted intertrochanteric L hip fx w/ varus angulation and impaction.  She was also found to have a minimally displaced intra-articular fx of the lateral aspect of the L radial head.  H/H lower than prior @ 7.8/24.0.  She was admitted and xarelto held.  She was seen by ortho w/ plan for surgery today (last eliquis dose 6/18).  She has not had any chest pain since admission.  She has had some rapid afib w/ rates into the 120's in the setting of being on less bisoprolol than prev rx (on 5 bid @ home, ordered as 5 daily here).    Inpatient Medications     acidophilus  1 capsule Oral Daily   ascorbic acid  2,000 mg Oral Daily   bisoprolol  5 mg Oral BID   Chlorhexidine Gluconate Cloth  6 each Topical Daily   vitamin B-12  1,000 mcg Oral Daily   And   folic acid  1  mg Oral Daily   And   vitamin B-6  50 mg Oral Daily   furosemide  20 mg Oral BID   insulin aspart  0-15 Units Subcutaneous TID AC & HS   levothyroxine  50 mcg Oral Q0600   linagliptin  5 mg Oral Daily   magnesium oxide  200 mg Oral Daily   multivitamin with minerals   Oral Daily   omega-3 acid ethyl esters  1,000 mg Oral BID   potassium chloride  10 mEq Oral BID    Family History    Family History  Problem Relation Age of Onset   Early death Father    Diabetes Mother    Heart disease Mother    Arthritis Brother    Cancer Brother    Depression Brother    Hearing loss Brother    Arthritis Brother    Cancer Brother        colon   Hearing loss Brother    Cancer Brother    Diabetes Brother    Hearing loss Brother    Heart disease Brother    Hyperlipidemia Brother    Heart disease Sister    Hypertension Sister    Stroke Sister    Hearing loss Daughter    Hypertension Daughter    Diabetes Paternal Grandfather    Hearing loss Sister    Heart disease Sister    Arthritis Sister    Depression Sister    Hearing loss Sister    Heart disease Sister    COPD Brother    Early death Brother    Early death Brother    She indicated that her mother is deceased. She indicated that her father is deceased. She indicated that all of her three sisters are alive. She indicated that two of her six brothers are  alive. She reported the following about her brother of unknown status: colon cancer . She indicated that the status of her paternal grandfather is unknown. She indicated that her daughter is deceased. She indicated that her son is alive.   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  Tobacco Use   Smoking status: Never   Smokeless tobacco: Never  Vaping  Use   Vaping Use: Never used  Substance and Sexual Activity   Alcohol use: No    Comment: may have occasional glass of wine   Drug use: No   Sexual activity: Not Currently  Other Topics Concern   Not on file  Social History Narrative   Has living will   Son is healthcare POA    also has grand daughter and great grand son   Would accept CPR but not prolonged artificial ventilation    No feeding tube if cognitively unaware   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)   2 pregnancies 1 stillborn    She has transportation problems no longer drives and has trouble finding a ride   Investment banker, operational of Radio broadcast assistant Strain: Medium Risk   Difficulty of Paying Living Expenses: Somewhat hard  Food Insecurity: No Food Insecurity   Worried About Charity fundraiser in the Last Year: Never true   Arboriculturist in the Last Year: Never true  Transportation Needs: Public librarian (Medical): Yes   Lack of Transportation (Non-Medical): Yes  Physical Activity: Insufficiently Active   Days of Exercise per Week: 3 days   Minutes of Exercise per Session: 20 min  Stress: No Stress Concern Present   Feeling of Stress : Not at all  Social Connections: Unknown   Frequency of Communication with Friends and Family: Not on file   Frequency of Social Gatherings with Friends and Family: More than three times a week   Attends Religious Services: Not on Electrical engineer or Organizations: Not on file   Attends Archivist Meetings: Not on file   Marital Status: Not on file  Intimate Partner Violence: Not At Risk   Fear of Current or Ex-Partner: No   Emotionally Abused: No   Physically Abused: No   Sexually Abused: No     Review of Systems    General:  No chills, fever, night sweats or weight changes.  Cardiovascular:  +++ occasional chest and epigastric pain, no dyspnea on exertion, edema, orthopnea, occasional palpitations, no paroxysmal  nocturnal dyspnea. Dermatological: No rash, lesions/masses Respiratory: No cough, dyspnea Urologic: No hematuria, dysuria Abdominal:   +++ frequent LLQ abd pain radiating to epigastrum and upper chest.  No nausea, vomiting, diarrhea, bright red blood per rectum, melena, or hematemesis Neurologic:  No visual changes, wkns, changes in mental status. MSK:  chronic diffuse pain, esp in setting of compression fxs. All other systems reviewed and are otherwise negative except as noted above.  Physical Exam    Blood pressure 121/73, pulse (!) 103, temperature 98.3 F (36.8 C), temperature source Oral, resp. rate 15, height 5\' 3"  (1.6 m), weight 62.2 kg, SpO2 (!) 42 %.  General: Pleasant, NAD Psych: Normal affect. Neuro:  initially confused, but easily reoriented.  Alert and oriented X 3. Moves all extremities spontaneously. HEENT: Normal  Neck: Supple without bruits or JVD. Lungs:  Resp regular and unlabored, CTA. Heart: IR, IR, no s3, s4, or murmurs. Abdomen: Soft, non-tender, non-distended, BS + x 4.  Extremities: No clubbing, cyanosis or edema. DP/PT trace bilat, Radials 2+ and equal bilaterally.  Labs    Cardiac Enzymes Recent Labs  Lab 10/08/20 0848  TROPONINIHS 4      Lab Results  Component Value Date   WBC 13.0 (H) 10/12/2020   HGB 9.8 (L) 10/12/2020   HCT 29.0 (L) 10/12/2020   MCV 101.0 (H) 10/12/2020   PLT 137 (L) 10/12/2020    Recent Labs  Lab 10/08/20 1848 10/10/20 0321 10/12/20 0613  NA 134*   < > 137  K 3.7   < > 3.7  CL 101   < > 104  CO2 29   < > 25  BUN 18   < > 15  CREATININE 0.62   < > 0.61  CALCIUM 8.6*   < > 7.8*  PROT 7.6  --   --   BILITOT 0.6  --   --   ALKPHOS 62  --   --   ALT 10  --   --   AST 21  --   --   GLUCOSE 167*   < > 202*   < > = values in this interval not displayed.   Lab Results  Component Value Date   CHOL 171 03/10/2020   HDL 67.50 03/10/2020   LDLCALC 87 03/10/2020   TRIG 79.0 03/10/2020    Radiology Studies    DG  Chest 2 View  Result Date: 10/09/2020 CLINICAL DATA:  Right-sided chest pain EXAM: CHEST - 2 VIEW COMPARISON:  04/06/2020 FINDINGS: Cardiac shadow is enlarged. Aortic calcifications are noted. The lungs are well aerated bilaterally without focal infiltrate or effusion. Changes of prior vertebral augmentation are noted and stable. Postsurgical changes in the left shoulder are noted. IMPRESSION: No acute abnormality noted. Electronically Signed   By: Inez Catalina M.D.   On: 10/09/2020 00:52   DG Elbow 2 Views Left  Result Date: 10/10/2020 CLINICAL DATA:  Tripped and fell EXAM: LEFT ELBOW - 2 VIEW COMPARISON:  09/24/2014 FINDINGS: Frontal and lateral views of the left elbow demonstrate a minimally displaced intra-articular fracture involving the lateral aspect of the radial head. Large joint effusion. Prominent dorsal soft tissue swelling of the proximal forearm. IMPRESSION: 1. Minimally displaced intra-articular fracture lateral aspect radial head. 2. Large joint effusion. 3. Prominent dorsal soft tissue swelling. Electronically Signed   By: Randa Ngo M.D.   On: 10/10/2020 03:08   CT Head Wo Contrast  Result Date: 10/10/2020 CLINICAL DATA:  Fall EXAM: CT HEAD WITHOUT CONTRAST CT CERVICAL SPINE WITHOUT CONTRAST TECHNIQUE: Multidetector CT imaging of the head and cervical spine was performed following the standard protocol without intravenous contrast. Multiplanar CT image reconstructions of the cervical spine were also generated. COMPARISON:  10/08/2020 FINDINGS: CT HEAD FINDINGS Brain: There is no mass, hemorrhage or extra-axial collection. There is generalized atrophy without lobar predilection. There is hypoattenuation of the periventricular white matter, most commonly indicating chronic ischemic microangiopathy. Vascular: Atherosclerotic calcification of the vertebral and internal carotid arteries at the skull base. No abnormal hyperdensity of the major intracranial arteries or dural venous sinuses.  Skull: The visualized skull base, calvarium and extracranial soft tissues are normal. Sinuses/Orbits: No fluid levels or advanced mucosal thickening  of the visualized paranasal sinuses. No mastoid or middle ear effusion. The orbits are normal. CT CERVICAL SPINE FINDINGS Alignment: No static subluxation. Facets are aligned. Occipital condyles are normally positioned. Skull base and vertebrae: No acute fracture. Soft tissues and spinal canal: No prevertebral fluid or swelling. No visible canal hematoma. Disc levels: No advanced spinal canal or neural foraminal stenosis. Multilevel degenerative change. Upper chest: No pneumothorax, pulmonary nodule or pleural effusion. Other: Normal visualized paraspinal cervical soft tissues. IMPRESSION: 1. Chronic ischemic microangiopathy and generalized atrophy without acute intracranial abnormality. 2. No acute fracture or static subluxation of the cervical spine. Electronically Signed   By: Ulyses Jarred M.D.   On: 10/10/2020 03:23   CT HEAD WO CONTRAST  Result Date: 10/08/2020 CLINICAL DATA:  Right-sided facial weakness upon awakening yesterday, headache EXAM: CT HEAD WITHOUT CONTRAST TECHNIQUE: Contiguous axial images were obtained from the base of the skull through the vertex without intravenous contrast. COMPARISON:  06/26/2013 FINDINGS: Brain: No acute infarct or hemorrhage. The lateral ventricles and midline structures are unremarkable. No acute extra-axial fluid collections. No mass effect. Vascular: Diffuse atherosclerosis throughout the internal carotid arteries. No hyperdense vessel. Skull: Normal. Negative for fracture or focal lesion. Sinuses/Orbits: No acute finding. Other: None. IMPRESSION: 1. No acute intracranial process. Electronically Signed   By: Randa Ngo M.D.   On: 10/08/2020 19:12   CT Cervical Spine Wo Contrast  Result Date: 10/10/2020 CLINICAL DATA:  Fall EXAM: CT HEAD WITHOUT CONTRAST CT CERVICAL SPINE WITHOUT CONTRAST TECHNIQUE: Multidetector  CT imaging of the head and cervical spine was performed following the standard protocol without intravenous contrast. Multiplanar CT image reconstructions of the cervical spine were also generated. COMPARISON:  10/08/2020 FINDINGS: CT HEAD FINDINGS Brain: There is no mass, hemorrhage or extra-axial collection. There is generalized atrophy without lobar predilection. There is hypoattenuation of the periventricular white matter, most commonly indicating chronic ischemic microangiopathy. Vascular: Atherosclerotic calcification of the vertebral and internal carotid arteries at the skull base. No abnormal hyperdensity of the major intracranial arteries or dural venous sinuses. Skull: The visualized skull base, calvarium and extracranial soft tissues are normal. Sinuses/Orbits: No fluid levels or advanced mucosal thickening of the visualized paranasal sinuses. No mastoid or middle ear effusion. The orbits are normal. CT CERVICAL SPINE FINDINGS Alignment: No static subluxation. Facets are aligned. Occipital condyles are normally positioned. Skull base and vertebrae: No acute fracture. Soft tissues and spinal canal: No prevertebral fluid or swelling. No visible canal hematoma. Disc levels: No advanced spinal canal or neural foraminal stenosis. Multilevel degenerative change. Upper chest: No pneumothorax, pulmonary nodule or pleural effusion. Other: Normal visualized paraspinal cervical soft tissues. IMPRESSION: 1. Chronic ischemic microangiopathy and generalized atrophy without acute intracranial abnormality. 2. No acute fracture or static subluxation of the cervical spine. Electronically Signed   By: Ulyses Jarred M.D.   On: 10/10/2020 03:23   MR BRAIN WO CONTRAST  Result Date: 10/09/2020 CLINICAL DATA:  Headache and right facial weakness EXAM: MRI HEAD WITHOUT CONTRAST TECHNIQUE: Multiplanar, multiecho pulse sequences of the brain and surrounding structures were obtained without intravenous contrast. COMPARISON:   None. FINDINGS: Brain: No acute infarct, mass effect or extra-axial collection. No acute or chronic hemorrhage. There is multifocal hyperintense T2-weighted signal within the white matter. Generalized volume loss without a clear lobar predilection. The midline structures are normal. Vascular: Major flow voids are preserved. Skull and upper cervical spine: Normal calvarium and skull base. Visualized upper cervical spine and soft tissues are normal. Sinuses/Orbits:No paranasal sinus fluid levels  or advanced mucosal thickening. No mastoid or middle ear effusion. Normal orbits. IMPRESSION: 1. No acute intracranial abnormality. 2. Generalized volume loss and findings of chronic small vessel disease. Electronically Signed   By: Ulyses Jarred M.D.   On: 10/09/2020 01:35   DG Chest Portable 1 View  Result Date: 10/10/2020 CLINICAL DATA:  [place], left hip fracture EXAM: PORTABLE CHEST 1 VIEW COMPARISON:  10/09/2020 FINDINGS: Single frontal view of the chest demonstrates a stable cardiac silhouette. No acute airspace disease, effusion, or pneumothorax. Prior vertebral augmentations at the thoracolumbar junction. Left shoulder arthroplasty. No acute fractures. IMPRESSION: 1. No acute intrathoracic process. Electronically Signed   By: Randa Ngo M.D.   On: 10/10/2020 03:07   DG Hip Unilat W or Wo Pelvis 2-3 Views Left  Result Date: 10/10/2020 CLINICAL DATA:  Tripped and fell, deformity EXAM: DG HIP (WITH OR WITHOUT PELVIS) 2-3V LEFT COMPARISON:  None. FINDINGS: Frontal view of the pelvis as well as frontal and cross-table lateral views of left hip are obtained. There is a comminuted intertrochanteric left hip fracture with impaction and varus angulation at the fracture site. No dislocation. Remainder of the bony pelvis is unremarkable. Prior L4 vertebral augmentation. IMPRESSION: 1. Comminuted intertrochanteric left hip fracture with varus angulation and impaction. Electronically Signed   By: Randa Ngo M.D.   On:  10/10/2020 03:06    ECG & Cardiac Imaging    Afib, 88, LAD, prior septal infarct, no acute ST/T changes - personally reviewed.  Assessment & Plan    1.  Permanent Atrial Fibrillation w/ RVR:  Pt w/ permanent atrial fibrillation on bid bisoprolol and xarelto @ home.  Admitted w/ fall and L hip fx, now pending surgical correction.  In setting of admission, she has had elevated HRs, sometimes into the 120's.  Xarelto has been on hold in preparation for surgery today.  Review of her meds reveals that she has only been getting her ? blocker once daily.  I have increased to 5 mg BID.  Can use prn IV metoprolol in perioperative setting if necessary.  Plan to resume xarelto in post-op setting when ok w/ ortho.  2.  L Hip Fx/Preoperative cardiovascular evaluation:  pt w/o h/p nonobs CAD and mildly abnl stress test in 2015. She has been medically managed since then.  She does report intermittent chest pain, however, this is tied to chronic LLQ and epigastric pain.  She does not experience exertional c/p and also denies dyspnea.  Most recent echo in 10/2018 w/ nl EF.  Perioperative risk of major cardiac event calculates to 6.6%.  In the absence of significant symptoms, additional cardiovascular testing at this time will not significantly change that risk.  Cont ? blocker throughout the perioperative period.  3.  Nonobstructive CAD/Chest Pain:  see #2.    4.  Essential HTN:  stable.  ? blocker adjusted to prior home dose.  Home dose of losartan on hold.  5.  Chronic HFpEF:  volume stable.  BP stable.  HRs elevated  Titrating ? blocker as above.  Cont lasix.  Signed, Murray Hodgkins, NP 10/12/2020, 9:22 AM  For questions or updates, please contact   Please consult [email or web address] for contact info under Cardiology/STEMI.

## 2020-10-12 NOTE — Progress Notes (Signed)
[facility] notified of afib and heart rate between 110-124, awaitiing

## 2020-10-12 NOTE — Op Note (Signed)
10/12/2020  7:27 PM  PATIENT:  Jodi Reeves    PRE-OPERATIVE DIAGNOSIS:  IM Nail Left Hip Fracture  POST-OPERATIVE DIAGNOSIS:  Same  PROCEDURE:  INTRAMEDULLARY (IM) NAIL FOR LEFT INTERTROCHANTERIC HIP FRACTURE  SURGEON:  Thornton Park, MD  ANESTHESIA:   General  EBL:  200 cc  IMPLANT:  ZIMMER BIOMET AFFIXUS NAIL 68mm x 275mm with a 160mm lag screw and distal interlocking screws 7mm  and 42 mm in length.  PREOPERATIVE INDICATIONS:  Jodi Reeves is a  87 y.o. female with a diagnosis of IM Nail Left Hip Fracture who failed conservative measures and elected for surgical management.    The risks, benefits and alternatives were discussed with the patient and their family.  The risks include but are not limited to infection, bleeding requiring blood transfusion, nerve or blood vessel injury, malunion, nonunion, hardware prominence, hardware failure, leg length discrepancy or change in lower extremity rotation and need for further surgery including hardware removal with conversion to a total hip arthroplasty. Medical risks include but are not limited to DVT and pulmonary embolism, myocardial infarction, stroke, pneumonia, respiratory failure and death. The patient and their son understood these risks and wished to proceed with surgery.  OPERATIVE PROCEDURE:  The patient was brought to the operating room and placed in the supine position on the fracture table. The patient received general anesthesia due to receiving Xarelto within 72 hours.  A closed reduction was performed under C-arm guidance.  The fracture reduction was confirmed on both AP and lateral views. After adequate reduction was achieved, a time out was performed to verify the patient's name, date of birth, medical record number, correct site of surgery correct procedure to be performed. The timeout was also used to verify the patient received antibiotics and all appropriate instruments, implants and radiographic studies were  available in the room. Once all in attendance were in agreement, the case began. The patient was prepped and draped in a sterile fashion. She received preoperative antibiotics with 2 grams IV and tolerated it without incident despite a PCN allergy.  An incision was made proximal to the greater trochanter in line with the femur. A guidewire was placed over the tip of the greater trochanter and advanced by drill into the proximal femur to the level of the lesser trochanter.  Confirmation of the drill pin position was made on AP and lateral C-arm images.  The threaded guidepin was then overdrilled with the proximal femoral entry reamer.  A ball-tipped guidewire was then advanced down the intramedullary canal, across the fracture, and down the femoral shaft to the knee.  The ball tip guidewire's position was confirmed at both the knee and hip via C-arm imaging. A depth gauge was used to measure the length of the long nail to be used. It was measured to be 260 mm. The actual nail was then inserted into the proximal femur, across the fracture site and down the femoral shaft. Its position was confirmed on AP and lateral C-arm images.  The ball tip guidewire was removed.  Once the nail was completely seated, the drill guide for the lag screw was placed through the guide arm for the Affixus nail. A guidepin was then placed through this drill guide and advanced through the lateral cortex of the femur.  The correct trajectory of the guidepin into the center of the femoral head could not be achieved initially.  The lag screw incision was then extended to allow for placement of a fracture reduction  clamp.  A crutch was also used to improve the alignment of the femoral shaft in relation to the femoral neck.  This allowed for placement of a guidewire across the fracture site and into the femoral head achieving a tip apex distance of less than 25 mm. The length of the drill pin was measured to be 115 mm, and then the drill for  the lag screw was advanced through the lateral cortex, across the fracture site and up into the femoral head to the depth of the lag screw.  The lag screw was then advanced by hand into position across the fracture site into the femoral head. Its final position was confirmed on AP and lateral C-arm images. Compression was applied as traction was carefully released. The set screw in the top of the intramedullary rod was tightened by hand using a screwdriver. It was backed off a quarter turn to allow for compression at the fracture site.  The attention was then turned to placement of the distal interlocking screws. A perfect circle technique was used. 2 small stab incisions were made over the distal interlocking screw holes.  A free hand technique was used to drill both distal interlocking screws. The depth of the screw holes was measured with a depth gauge. The 21mm and 43mm screws were then advanced into position and tightened by hand. Final C-arm images of the entire intramedullary construct were taken in both the AP and lateral planes.   The wounds were irrigated copiously and closed with 0 Vicryl for closure of the deep fascia and 2-0 Vicryl for subcutaneous closure. The skin was approximated with staples. A dry sterile dressing was applied. I was scrubbed and present the entire case and all sharp, sponge and instrument counts were correct at the conclusion of the case. Patient was transferred to a hospital bed and brought to PACU in stable condition.  I spoke with patient's son in the waiting room to let he and his wife know that the case had been successfully performed and his mother was stable in the recovery room.    Timoteo Gaul, MD

## 2020-10-12 NOTE — Progress Notes (Signed)
PROGRESS NOTE    Jodi Reeves  JQB:[id number] DOB: 08-14-1927 DOA: 10/10/2020 PCP: [name], Nino Glow, MD   Chief complaint.  Hip pain. Brief Narrative:  Jodi Reeves is a 87 y.o. female with medical history significant for multiple medical problems that are mentioned below, including atrial fibrillation with anticoagulation with Xarelto, who presented to the emergency room with acute onset of left hip pain.  The patient was turning off the light switch when she stepped back and lost her balance and fell on the floor.  She sustained left hip fracture.  She has been seen by Dr. Mack Guise, planning for hip surgery 6/21.   Assessment & Plan:   Active Problems:   Hypothyroidism   Essential hypertension   Permanent atrial fibrillation (HCC)   Chronic diastolic CHF (congestive heart failure) (HCC)   Type 2 diabetes mellitus with diabetic polyneuropathy, without long-term current use of insulin (HCC)   Closed left hip fracture (HCC)   Acute blood loss anemia   Thrombocytopenia ([place])  #1.  Left hip fracture. Acute blood loss anemia from fracture.   Mild thrombocytopenia secondary to acute blood loss. Patient received a 1 unit PRBC for acute blood loss anemia from hip fracture. Hemoglobin is better. Surgery scheduled at 3 PM today.  #2.  Chronic diastolic congestive heart failure. Permanent atrial fibrillation. Patient had episode of rapid ventricular response this morning, heart rate is better after giving oral metoprolol.  Patient is seen by cardiology.  Currently patient does not have exacerbation congestive heart failure.  3.  Type 2 diabetes. Continue sliding scale insulin.    DVT prophylaxis: SCDs Code Status: full Family Communication: Son updated. Disposition Plan:    Status is: Inpatient  Remains inpatient appropriate because:Inpatient level of care appropriate due to severity of illness  Dispo: The patient is from: Home              Anticipated d/c is  to: SNF              Patient currently is not medically stable to d/c.   Difficult to place patient No        I/O last 3 completed shifts: In: 370 [Blood:370] Out: 2175 [Urine:2175] No intake/output data recorded.     Consultants:  Orthopedics  Procedures: Hip today  Antimicrobials: None  Subjective: Patient had episode of rapid ventricular response this morning on the monitor, patient did not have any symptoms.  Denies any short of breath, no chest pain or palpitation. No cough. No dysuria hematuria. No fever or chills. Her hip pain is better controlled with IV morphine alternating with oral Lortab.  Objective: Vitals:   10/11/20 2104 10/12/20 0014 10/12/20 0413 10/12/20 0748  BP: 123/76 132/74 119/73 121/73  Pulse: 92 95 (!) 109 (!) 103  Resp:  18 18 15   Temp: 98.8 F (37.1 C) 98.2 F (36.8 C) 98 F (36.7 C) 98.3 F (36.8 C)  TempSrc: Oral Oral Oral Oral  SpO2: (!) 84% 98% 100% (!) 42%  Weight:      Height:        Intake/Output Summary (Last 24 hours) at 10/12/2020 0938 Last data filed at 10/12/2020 0421 Gross per 24 hour  Intake 370 ml  Output 1675 ml  Net -1305 ml   Filed Weights   10/10/20 0230  Weight: 62.2 kg    Examination:  General exam: Appears calm and comfortable  Respiratory system: Clear to auscultation. Respiratory effort normal. Cardiovascular system: Irregularly irregular, mildly tachycardic.  No  JVD, murmurs, rubs, gallops or clicks. No pedal edema. Gastrointestinal system: Abdomen is nondistended, soft and nontender. No organomegaly or masses felt. Normal bowel sounds heard. Central nervous system: Alert and oriented x3. No focal neurological deficits. Extremities: Symmetric 5 x 5 power. Skin: No rashes, lesions or ulcers Psychiatry: Judgement and insight appear normal. Mood & affect appropriate.     Data Reviewed: I have personally reviewed following labs and imaging studies  CBC: Recent Labs  Lab 10/08/20 1848  10/10/20 0321 10/11/20 0454 10/12/20 0613  WBC 5.6 9.4 9.0 13.0*  NEUTROABS 3.5 8.1*  --  10.3*  HGB 11.9* 10.8* 7.8* 9.8*  HCT 36.6 33.1* 24.0* 29.0*  MCV 103.4* 104.4* 105.3* 101.0*  PLT 196 170 122* 657*   Basic Metabolic Panel: Recent Labs  Lab 10/08/20 1848 10/10/20 0321 10/11/20 0454 10/12/20 0613  NA 134* 138 137 137  K 3.7 3.7 4.4 3.7  CL 101 106 108 104  CO2 29 23 24 25   GLUCOSE 167* 253* 165* 202*  BUN 18 19 15 15   CREATININE 0.62 0.57 0.56 0.61  CALCIUM 8.6* 8.2* 7.6* 7.8*  MG  --   --  2.0 2.0   GFR: Estimated Creatinine Clearance: 37.9 mL/min (by C-G formula based on SCr of 0.61 mg/dL). Liver Function Tests: Recent Labs  Lab 10/08/20 1848  AST 21  ALT 10  ALKPHOS 62  BILITOT 0.6  PROT 7.6  ALBUMIN 3.9   No results for input(s): LIPASE, AMYLASE in the last 168 hours. No results for input(s): AMMONIA in the last 168 hours. Coagulation Profile: Recent Labs  Lab 10/10/20 0321  INR 2.7*   Cardiac Enzymes: No results for input(s): CKTOTAL, CKMB, CKMBINDEX, TROPONINI in the last 168 hours. BNP (last 3 results) No results for input(s): PROBNP in the last 8760 hours. HbA1C: Recent Labs    10/10/20 0810  HGBA1C 7.8*   CBG: Recent Labs  Lab 10/11/20 0748 10/11/20 1127 10/11/20 1735 10/11/20 2100 10/12/20 0742  GLUCAP 162* 167* 236* 171* 176*   Lipid Profile: No results for input(s): CHOL, HDL, LDLCALC, TRIG, CHOLHDL, LDLDIRECT in the last 72 hours. Thyroid Function Tests: No results for input(s): TSH, T4TOTAL, FREET4, T3FREE, THYROIDAB in the last 72 hours. Anemia Panel: No results for input(s): VITAMINB12, FOLATE, FERRITIN, TIBC, IRON, RETICCTPCT in the last 72 hours. Sepsis Labs: No results for input(s): PROCALCITON, LATICACIDVEN in the last 168 hours.  Recent Results (from the past 240 hour(s))  Resp Panel by RT-PCR (Flu A&B, Covid) Nasopharyngeal Swab     Status: None   Collection Time: 10/10/20  3:21 AM   Specimen: Nasopharyngeal  Swab; Nasopharyngeal(NP) swabs in vial transport medium  Result Value Ref Range Status   SARS Coronavirus 2 by RT PCR NEGATIVE NEGATIVE Final    Comment: (NOTE) SARS-CoV-2 target nucleic acids are NOT DETECTED.  The SARS-CoV-2 RNA is generally detectable in upper respiratory specimens during the acute phase of infection. The lowest concentration of SARS-CoV-2 viral copies this assay can detect is 138 copies/mL. A negative result does not preclude SARS-Cov-2 infection and should not be used as the sole basis for treatment or other patient management decisions. A negative result may occur with  improper specimen collection/handling, submission of specimen other than nasopharyngeal swab, presence of viral mutation(s) within the areas targeted by this assay, and inadequate number of viral copies(<138 copies/mL). A negative result must be combined with clinical observations, patient history, and epidemiological information. The expected result is Negative.  Fact Sheet for Patients:  EntrepreneurPulse.com.au  Fact Sheet for Healthcare Providers:  IncredibleEmployment.be  This test is no t yet approved or cleared by the [place] FDA and  has been authorized for detection and/or diagnosis of SARS-CoV-2 by FDA under an Emergency Use Authorization (EUA). This EUA will remain  in effect (meaning this test can be used) for the duration of the COVID-19 declaration under Section 564(b)(1) of the Act, 21 U.S.C.section 360bbb-3(b)(1), unless the authorization is terminated  or revoked sooner.       Influenza A by PCR NEGATIVE NEGATIVE Final   Influenza B by PCR NEGATIVE NEGATIVE Final    Comment: (NOTE) The Xpert Xpress SARS-CoV-2/FLU/RSV plus assay is intended as an aid in the diagnosis of influenza from Nasopharyngeal swab specimens and should not be used as a sole basis for treatment. Nasal washings and aspirates are unacceptable for Xpert Xpress  SARS-CoV-2/FLU/RSV testing.  Fact Sheet for Patients: EntrepreneurPulse.com.au  Fact Sheet for Healthcare Providers: IncredibleEmployment.be  This test is not yet approved or cleared by the [place] FDA and has been authorized for detection and/or diagnosis of SARS-CoV-2 by FDA under an Emergency Use Authorization (EUA). This EUA will remain in effect (meaning this test can be used) for the duration of the COVID-19 declaration under Section 564(b)(1) of the Act, 21 U.S.C. section 360bbb-3(b)(1), unless the authorization is terminated or revoked.  Performed at [facility], [address], [place], [place] [address]   Surgical PCR screen     Status: None   Collection Time: 10/10/20 11:20 PM   Specimen: Nasal Mucosa; Nasal Swab  Result Value Ref Range Status   MRSA, PCR NEGATIVE NEGATIVE Final   Staphylococcus aureus NEGATIVE NEGATIVE Final    Comment: (NOTE) The Xpert SA Assay (FDA approved for NASAL specimens in patients 45 years of age and older), is one component of a comprehensive surveillance program. It is not intended to diagnose infection nor to guide or monitor treatment. Performed at [facility], [address] [place], [place] [address]          Radiology Studies: No results found.      Scheduled Meds:  acidophilus  1 capsule Oral Daily   ascorbic acid  2,000 mg Oral Daily   bisoprolol  5 mg Oral BID   Chlorhexidine Gluconate Cloth  6 each Topical Daily   vitamin B-12  1,000 mcg Oral Daily   And   folic acid  1 mg Oral Daily   And   vitamin B-6  50 mg Oral Daily   furosemide  20 mg Oral BID   insulin aspart  0-15 Units Subcutaneous TID AC & HS   levothyroxine  50 mcg Oral Q0600   linagliptin  5 mg Oral Daily   magnesium oxide  200 mg Oral Daily   multivitamin with minerals   Oral Daily   omega-3 acid ethyl esters  1,000 mg Oral BID   potassium chloride  10 mEq Oral BID    Continuous Infusions:   LOS: 2 days    Time spent: 32 minutes    Sharen Hones, MD [facility]   To contact the attending provider between 7A-7P or the covering provider during after hours 7P-7A, please log into the web site [email or web address] and access using universal [facility] password for that web site. If you do not have the password, please call the hospital operator.  10/12/2020, 9:38 AM

## 2020-10-12 NOTE — Anesthesia Postprocedure Evaluation (Signed)
Anesthesia Post Note  Patient: Jodi Reeves  Procedure(s) Performed: INTRAMEDULLARY (IM) NAIL INTERTROCHANTRIC (Left)  Patient location during evaluation: PACU Anesthesia Type: General Level of consciousness: awake and alert Pain management: pain level controlled Vital Signs Assessment: post-procedure vital signs reviewed and stable Respiratory status: spontaneous breathing and respiratory function stable Cardiovascular status: stable Anesthetic complications: no   No notable events documented.   Last Vitals:  Vitals:   10/12/20 0748 10/12/20 1429  BP: 121/73 123/60  Pulse: (!) 103 82  Resp: 15 18  Temp: 36.8 C 36.7 C  SpO2: (!) 42% 99%    Last Pain:  Vitals:   10/12/20 1429  TempSrc: Oral  PainSc: 4                  [name]

## 2020-10-13 ENCOUNTER — Encounter: Payer: Self-pay | Admitting: Orthopedic Surgery

## 2020-10-13 DIAGNOSIS — S72142A Displaced intertrochanteric fracture of left femur, initial encounter for closed fracture: Principal | ICD-10-CM

## 2020-10-13 DIAGNOSIS — I5032 Chronic diastolic (congestive) heart failure: Secondary | ICD-10-CM | POA: Diagnosis not present

## 2020-10-13 DIAGNOSIS — I4821 Permanent atrial fibrillation: Secondary | ICD-10-CM | POA: Diagnosis not present

## 2020-10-13 LAB — CBC WITH DIFFERENTIAL/PLATELET
Abs Immature Granulocytes: 0.07 10*3/uL (ref 0.00–0.07)
Basophils Absolute: 0 10*3/uL (ref 0.0–0.1)
Basophils Relative: 0 %
Eosinophils Absolute: 0 10*3/uL (ref 0.0–0.5)
Eosinophils Relative: 0 %
HCT: 26.2 % — ABNORMAL LOW (ref 36.0–46.0)
Hemoglobin: 8.6 g/dL — ABNORMAL LOW (ref 12.0–15.0)
Immature Granulocytes: 1 %
Lymphocytes Relative: 9 %
Lymphs Abs: 0.9 10*3/uL (ref 0.7–4.0)
MCH: 34.1 pg — ABNORMAL HIGH (ref 26.0–34.0)
MCHC: 32.8 g/dL (ref 30.0–36.0)
MCV: 104 fL — ABNORMAL HIGH (ref 80.0–100.0)
Monocytes Absolute: 0.5 10*3/uL (ref 0.1–1.0)
Monocytes Relative: 6 %
Neutro Abs: 8.2 10*3/uL — ABNORMAL HIGH (ref 1.7–7.7)
Neutrophils Relative %: 84 %
Platelets: 145 10*3/uL — ABNORMAL LOW (ref 150–400)
RBC: 2.52 MIL/uL — ABNORMAL LOW (ref 3.87–5.11)
RDW: 14.6 % (ref 11.5–15.5)
WBC: 9.7 10*3/uL (ref 4.0–10.5)
nRBC: 0.2 % (ref 0.0–0.2)

## 2020-10-13 LAB — BASIC METABOLIC PANEL
Anion gap: 7 (ref 5–15)
Anion gap: 7 (ref 5–15)
BUN: 23 mg/dL (ref 8–23)
BUN: 34 mg/dL — ABNORMAL HIGH (ref 8–23)
CO2: 24 mmol/L (ref 22–32)
CO2: 24 mmol/L (ref 22–32)
Calcium: 7.3 mg/dL — ABNORMAL LOW (ref 8.9–10.3)
Calcium: 7.5 mg/dL — ABNORMAL LOW (ref 8.9–10.3)
Chloride: 107 mmol/L (ref 98–111)
Chloride: 103 mmol/L (ref 98–111)
Creatinine, Ser: 0.71 mg/dL (ref 0.44–1.00)
Creatinine, Ser: 0.89 mg/dL (ref 0.44–1.00)
GFR, Estimated: >60 mL/min (ref >60)
GFR, Estimated: >60 mL/min (ref >60)
Glucose, Bld: 186 mg/dL — ABNORMAL HIGH (ref 70–99)
Glucose, Bld: 314 mg/dL — ABNORMAL HIGH (ref 70–99)
Potassium: 4 mmol/L (ref 3.5–5.1)
Potassium: 4.1 mmol/L (ref 3.5–5.1)
Sodium: 138 mmol/L (ref 135–145)
Sodium: 134 mmol/L — ABNORMAL LOW (ref 135–145)

## 2020-10-13 LAB — GLUCOSE, CAPILLARY
Glucose-Capillary: 229 mg/dL — ABNORMAL HIGH (ref 70–99)
Glucose-Capillary: 277 mg/dL — ABNORMAL HIGH (ref 70–99)
Glucose-Capillary: 254 mg/dL — ABNORMAL HIGH (ref 70–99)
Glucose-Capillary: 291 mg/dL — ABNORMAL HIGH (ref 70–99)

## 2020-10-13 LAB — VITAMIN B12: Vitamin B-12: 4418 pg/mL — ABNORMAL HIGH (ref 180–914)

## 2020-10-13 LAB — MAGNESIUM: Magnesium: 2.1 mg/dL (ref 1.7–2.4)

## 2020-10-13 LAB — FOLATE: Folate: 33 ng/mL (ref >5.9)

## 2020-10-13 MED ORDER — SODIUM CHLORIDE 0.9 % IV BOLUS
500.0000 mL | Freq: Once | INTRAVENOUS | Status: AC
  Administered 2020-10-13: 500 mL via INTRAVENOUS

## 2020-10-13 MED ORDER — POLYETHYLENE GLYCOL 3350 17 G PO PACK
17.0000 g | PACK | Freq: Every day | ORAL | Status: DC
  Administered 2020-10-14 – 2020-10-20 (×7): 17 g via ORAL
  Filled 2020-10-13 (×6): qty 1

## 2020-10-13 MED ORDER — METFORMIN HCL ER 500 MG PO TB24
500.0000 mg | ORAL_TABLET | Freq: Two times a day (BID) | ORAL | Status: DC
  Administered 2020-10-13 – 2020-10-20 (×13): 500 mg via ORAL
  Filled 2020-10-13 (×16): qty 1

## 2020-10-13 MED ORDER — RIVAROXABAN 20 MG PO TABS
20.0000 mg | ORAL_TABLET | Freq: Every day | ORAL | Status: DC
  Administered 2020-10-13: 20 mg via ORAL
  Filled 2020-10-13: qty 1

## 2020-10-13 NOTE — Progress Notes (Addendum)
Subjective:  POD #1 s/p IM nail for left IT hip fracture.   Patient reports left hip  pain as mild.  She is not having significant pain in her left elbow but complains of chronic compression fractures in her back.  Objective:   VITALS:   Vitals:   10/13/20 0001 10/13/20 0512 10/13/20 0755 10/13/20 1136  BP: 97/60 109/65 104/64 (!) 108/56  Pulse: 77 (!) 109 84 91  Resp:  16 17 17   Temp:  97.8 F (36.6 C) 97.8 F (36.6 C) 98.4 F (36.9 C)  TempSrc:      SpO2:  94%  99%  Weight:      Height:        PHYSICAL EXAM: Left lower extremity Neurovascular intact Sensation intact distally Intact pulses distally Dorsiflexion/Plantar flexion intact Incision: moderate drainage No cellulitis present Compartment soft  LABS  Results for orders placed or performed during the hospital encounter of 10/10/20 (from the past 24 hour(s))  Glucose, capillary     Status: Abnormal   Collection Time: 10/12/20  7:01 PM  Result Value Ref Range   Glucose-Capillary 111 (H) 70 - 99 mg/dL  Glucose, capillary     Status: Abnormal   Collection Time: 10/12/20  9:45 PM  Result Value Ref Range   Glucose-Capillary 202 (H) 70 - 99 mg/dL   Comment 1 Notify RN   Glucose, capillary     Status: Abnormal   Collection Time: 10/12/20 10:25 PM  Result Value Ref Range   Glucose-Capillary 234 (H) 70 - 99 mg/dL  CBC with Differential/Platelet     Status: Abnormal   Collection Time: 10/13/20  5:21 AM  Result Value Ref Range   WBC 9.7 4.0 - 10.5 K/uL   RBC 2.52 (L) 3.87 - 5.11 MIL/uL   Hemoglobin 8.6 (L) 12.0 - 15.0 g/dL   HCT 26.2 (L) 36.0 - 46.0 %   MCV 104.0 (H) 80.0 - 100.0 fL   MCH 34.1 (H) 26.0 - 34.0 pg   MCHC 32.8 30.0 - 36.0 g/dL   RDW 14.6 11.5 - 15.5 %   Platelets 145 (L) 150 - 400 K/uL   nRBC 0.2 0.0 - 0.2 %   Neutrophils Relative % 84 %   Neutro Abs 8.2 (H) 1.7 - 7.7 K/uL   Lymphocytes Relative 9 %   Lymphs Abs 0.9 0.7 - 4.0 K/uL   Monocytes Relative 6 %   Monocytes Absolute 0.5 0.1 - 1.0  K/uL   Eosinophils Relative 0 %   Eosinophils Absolute 0.0 0.0 - 0.5 K/uL   Basophils Relative 0 %   Basophils Absolute 0.0 0.0 - 0.1 K/uL   Immature Granulocytes 1 %   Abs Immature Granulocytes 0.07 0.00 - 0.07 K/uL  Basic metabolic panel     Status: Abnormal   Collection Time: 10/13/20  5:21 AM  Result Value Ref Range   Sodium 138 135 - 145 mmol/L   Potassium 4.0 3.5 - 5.1 mmol/L   Chloride 107 98 - 111 mmol/L   CO2 24 22 - 32 mmol/L   Glucose, Bld 186 (H) 70 - 99 mg/dL   BUN 23 8 - 23 mg/dL   Creatinine, Ser 0.71 0.44 - 1.00 mg/dL   Calcium 7.3 (L) 8.9 - 10.3 mg/dL   GFR, Estimated >60 >60 mL/min   Anion gap 7 5 - 15  Magnesium     Status: None   Collection Time: 10/13/20  5:21 AM  Result Value Ref Range   Magnesium 2.1 1.7 -  2.4 mg/dL  Glucose, capillary     Status: Abnormal   Collection Time: 10/13/20  9:06 AM  Result Value Ref Range   Glucose-Capillary 229 (H) 70 - 99 mg/dL  Glucose, capillary     Status: Abnormal   Collection Time: 10/13/20 12:45 PM  Result Value Ref Range   Glucose-Capillary 277 (H) 70 - 99 mg/dL    DG HIP OPERATIVE UNILAT W OR W/O PELVIS LEFT  Result Date: 10/12/2020 CLINICAL DATA:  Left intratrochanteric fracture EXAM: OPERATIVE LEFT HIP WITH PELVIS COMPARISON:  10/10/2020 FLUOROSCOPY TIME:  Radiation Exposure Index (as provided by the fluoroscopic device): Not available If the device does not provide the exposure index: Fluoroscopy Time:  2 minutes 49 seconds Number of Acquired Images:  6 FINDINGS: Medullary rod is noted within the left femur with proximal and distal fixation screws. The proximal screw traverses the femoral neck. Fracture fragments are in near anatomic alignment. IMPRESSION: ORIF of proximal left femoral fracture. Electronically Signed   By: Inez Catalina M.D.   On: 10/12/2020 18:45   DG FEMUR PORT MIN 2 VIEWS LEFT  Result Date: 10/12/2020 CLINICAL DATA:  Postop EXAM: LEFT FEMUR PORTABLE 2 VIEWS COMPARISON:  10/10/2020 FINDINGS:  Interval intramedullary rod and screw fixation of left femur for comminuted intertrochanteric fracture. Multiple displaced lesser trochanteric fracture fragments as before. Gas in the soft tissues consistent with recent surgery. IMPRESSION: Interval ORIF of proximal left femur fracture Electronically Signed   By: Donavan Foil M.D.   On: 10/12/2020 19:52    Assessment/Plan: 1 Day Post-Op   Active Problems:   Hypothyroidism   Essential hypertension   Permanent atrial fibrillation (HCC)   Chronic diastolic CHF (congestive heart failure) ([place])   Type 2 diabetes mellitus with diabetic polyneuropathy, without long-term current use of insulin (HCC)   Closed left hip fracture (HCC)   Acute blood loss anemia   Thrombocytopenia (HCC)   Closed intertrochanteric fracture of hip, left, initial encounter [facility])  Patient will continue physical therapy for gait training, hip range of motion and lower extremity strengthening.  Patient is 50% partial weightbearing on the left lower extremity.  She is weightbearing as tolerated in a platform walker with her left upper extremity despite a radial head fracture.  Patient may discontinue her sling for physical therapy and to use a walker.  Her sling is for comfort.    Thornton Park , MD 10/13/2020, 2:48 PM

## 2020-10-13 NOTE — NC FL2 (Signed)
[place] LEVEL OF CARE SCREENING TOOL     IDENTIFICATION  Patient Name: Jodi Reeves Birthdate: 03-15-1928 Sex: female Admission Date (Current Location): 10/10/2020  [facility] and [place] Number:  Engineering geologist and Address:  [facility], [address], [place], [place] [address]      Provider Number: [id number]  Attending Physician Name and Address:  Gwynne Edinger, MD  Relative Name and Phone Number:  Octavia Bruckner SOn [phone]    Current Level of Care: Hospital Recommended Level of Care: [place] Prior Approval Number:    Date Approved/Denied:   PASRR Number: [id number]  Discharge Plan: SNF    Current Diagnoses: Patient Active Problem List   Diagnosis Date Noted   Closed intertrochanteric fracture of hip, left, initial encounter ([place])    Acute blood loss anemia 10/11/2020   Thrombocytopenia ([place]) 10/11/2020   Closed left hip fracture ([place]) 10/10/2020   UTI (urinary tract infection) 04/10/2020   Abdominal pain 04/09/2020   Hypertension associated with diabetes ([place]) 03/15/2020   Obesity (BMI 30-39.9) 03/15/2020   Coronary artery disease involving native coronary artery of native heart without angina pectoris 03/15/2020   PAD (peripheral artery disease) ([place]) 08/14/2019   Foot ulcer ([place]) 06/19/2019   Lymphedema 06/09/2019   Chronic venous insufficiency 06/09/2019   Diabetic retinopathy associated with type 2 diabetes mellitus ([place]) 01/20/2019   Low back pain 09/24/2018   Overactive bladder 04/10/2018   History of UTI 04/10/2018   Hematuria 04/10/2018   Diarrhea 04/10/2018   Venous ulcer of leg ([place]) 04/10/2018   Status post endoscopic carpal tunnel release 03/19/2018   History of skin cancer 11/08/2017   Chronic anticoagulation 06/11/2017   Aortic atherosclerosis ([place]) 05/08/2017   Coronary artery disease, non-occlusive 03/29/2017   Persistent atrial fibrillation ([place]) 03/29/2017   Carpal tunnel  syndrome 03/28/2017   Cervical radiculitis 03/28/2017   Problems with swallowing and mastication    Esophageal candidiasis ([place])    Stricture and stenosis of esophagus 05/02/2016   TIA (transient ischemic attack) 06/24/2015   Status post reverse total shoulder replacement, left 10/16/2014   Humeral head fracture 10/06/2014   Chest pain 03/27/2014   Shortness of breath 03/27/2014   Chronic diastolic CHF (congestive heart failure) ([place]) 03/27/2014   Advanced directives, counseling/discussion 02/24/2014   Routine general medical examination at a health care facility 02/17/2013   Diabetes, polyneuropathy ([place]) 02/17/2013   Type 2 diabetes mellitus with diabetic polyneuropathy, without long-term current use of insulin ([place]) 02/17/2013   Gait instability 04/11/2012   Edema 11/14/2010   OTHER CONSTIPATION 05/31/2007   VENTRAL HERNIA 03/15/2007   Ovarian cancer ([place]) 11/20/2006   Hypothyroidism 11/20/2006   Type 2 diabetes mellitus with neurological manifestations, controlled ([place]) 11/20/2006   Essential hypertension 11/20/2006   Permanent atrial fibrillation ([place]) 11/20/2006   Osteoporosis 11/20/2006   Hyperlipidemia due to type 2 diabetes mellitus ([place]) 11/20/2006    Orientation RESPIRATION BLADDER Height & Weight     Self, Time, Situation, Place  Normal Continent, External catheter Weight: 62.2 kg Height:  5\' 3"  (160 cm)  BEHAVIORAL SYMPTOMS/MOOD NEUROLOGICAL BOWEL NUTRITION STATUS      Continent Diet (regular)  AMBULATORY STATUS COMMUNICATION OF NEEDS Skin   Extensive Assist Verbally Surgical wounds, Normal                       Personal Care Assistance Level of Assistance  Bathing, Dressing Bathing Assistance: Limited assistance   Dressing Assistance: Limited  assistance     Functional Limitations Info             SPECIAL CARE FACTORS FREQUENCY  PT (By licensed PT)     PT Frequency: 5 times per week              Contractures      Additional Factors  Info  Code Status, Allergies Code Status Info: full code Allergies Info: Amiodarone, Fosamax Alendronate Penicillins, Sulfa Antibiotics, Actos Pioglitazone, Amaryl Glimepiride, Atorvastatin, Bentyl Dicyclomine Hcl Ciprofloxacin, Codeine, Ezetimibe-simvastatin, Lovastatin, Macrobid (Nitrofurantoin Macrocrystal), Protonix (Pantoprazole Sodium), Rosiglitazone Maleate, Statins, Victoza liraglutid, Alendronate Sodium, Bactrim           Current Medications (10/13/2020):  This is the current hospital active medication list Current Facility-Administered Medications  Medication Dose Route Frequency Provider Last Rate Last Admin   acetaminophen (TYLENOL) tablet 650 mg  650 mg Oral Q6H PRN Thornton Park, MD   650 mg at 10/10/20 2133   Or   acetaminophen (TYLENOL) suppository 650 mg  650 mg Rectal Q6H PRN Thornton Park, MD       acetaminophen (TYLENOL) tablet 500 mg  500 mg Oral Q6H Thornton Park, MD       acidophilus (RISAQUAD) capsule 1 capsule  1 capsule Oral Daily Thornton Park, MD   1 capsule at 10/13/20 0808   alum & mag hydroxide-simeth (MAALOX/MYLANTA) 200-200-20 MG/5ML suspension 30 mL  30 mL Oral Q4H PRN Thornton Park, MD       ascorbic acid (VITAMIN C) tablet 2,000 mg  2,000 mg Oral Daily Thornton Park, MD   2,000 mg at 10/13/20 9371   bisacodyl (DULCOLAX) suppository 10 mg  10 mg Rectal Daily PRN Thornton Park, MD       bisoprolol (ZEBETA) tablet 5 mg  5 mg Oral BID Thornton Park, MD   5 mg at 10/13/20 6967   Chlorhexidine Gluconate Cloth 2 % PADS 6 each  6 each Topical Daily Thornton Park, MD   6 each at 10/12/20 0913   dimethicone 1 % cream   Topical BID PRN Thornton Park, MD       docusate sodium (COLACE) capsule 100 mg  100 mg Oral Daily PRN Thornton Park, MD       docusate sodium (COLACE) capsule 100 mg  100 mg Oral BID Thornton Park, MD   100 mg at 10/13/20 8938   vitamin B-12 (CYANOCOBALAMIN) tablet 1,000 mcg  1,000 mcg Oral Daily Thornton Park, MD    1,000 mcg at 02/07/50 0258   And   folic acid (FOLVITE) tablet 1 mg  1 mg Oral Daily Thornton Park, MD   1 mg at 10/13/20 5277   And   pyridOXINE (VITAMIN B-6) tablet 50 mg  50 mg Oral Daily Thornton Park, MD   50 mg at 10/13/20 8242   furosemide (LASIX) tablet 20 mg  20 mg Oral BID Thornton Park, MD   20 mg at 10/13/20 3536   HYDROcodone-acetaminophen (NORCO/VICODIN) 5-325 MG per tablet 1-2 tablet  1-2 tablet Oral Q4H PRN Thornton Park, MD       insulin aspart (novoLOG) injection 0-15 Units  0-15 Units Subcutaneous TID AC & HS Thornton Park, MD   8 Units at 10/13/20 1256   labetalol (NORMODYNE) injection 20 mg  20 mg Intravenous Q3H PRN Thornton Park, MD       levothyroxine (SYNTHROID) tablet 50 mcg  50 mcg Oral Q0600 Thornton Park, MD   50 mcg at 10/13/20 0521   linagliptin (TRADJENTA) tablet 5  mg  5 mg Oral Daily Thornton Park, MD   5 mg at 10/13/20 7989   magnesium hydroxide (MILK OF MAGNESIA) suspension 30 mL  30 mL Oral Daily PRN Thornton Park, MD       magnesium oxide (MAG-OX) tablet 200 mg  200 mg Oral Daily Thornton Park, MD   200 mg at 10/13/20 2119   metFORMIN (GLUCOPHAGE-XR) 24 hr tablet 500 mg  500 mg Oral BID AC Wouk, Ailene Rud, MD       morphine 2 MG/ML injection 0.5-1 mg  0.5-1 mg Intravenous Q2H PRN Thornton Park, MD       multivitamin with minerals tablet   Oral Daily Thornton Park, MD   1 tablet at 10/13/20 4174   olopatadine (PATANOL) 0.1 % ophthalmic solution 1 drop  1 drop Both Eyes BID PRN Thornton Park, MD       omega-3 acid ethyl esters (LOVAZA) capsule 1,000 mg  1,000 mg Oral BID Thornton Park, MD   1,000 mg at 10/13/20 0807   ondansetron (ZOFRAN) tablet 4 mg  4 mg Oral Q6H PRN Thornton Park, MD       Or   ondansetron [facility]) injection 4 mg  4 mg Intravenous Q6H PRN Thornton Park, MD       polyethylene glycol (MIRALAX / GLYCOLAX) packet 17 g  17 g Oral Daily Wouk, Ailene Rud, MD       potassium chloride (KLOR-CON)  CR tablet 10 mEq  10 mEq Oral BID Thornton Park, MD   10 mEq at 10/13/20 0814   rivaroxaban (XARELTO) tablet 20 mg  20 mg Oral Q supper Thornton Park, MD       senna Donavan [facility]et) tablet 8.6 mg  1 tablet Oral BID Thornton Park, MD   8.6 mg at 10/13/20 4818   traMADol (ULTRAM) tablet 50 mg  50 mg Oral Q6H Thornton Park, MD       traZODone (DESYREL) tablet 25 mg  25 mg Oral QHS PRN Thornton Park, MD   25 mg at 10/10/20 2133     Discharge Medications: Please see discharge summary for a list of discharge medications.  Relevant Imaging Results:  Relevant Lab Results:   Additional Information SS#: [id number]  Su Hilt, RN

## 2020-10-13 NOTE — Evaluation (Addendum)
Occupational Therapy Evaluation Patient Details Name: Jodi Reeves MRN: [id number] DOB: 1927-05-15 Today's Date: 10/13/2020    History of Present Illness Jodi Reeves is a  87 y.o. female with a diagnosis of non-surgical L radial head fx; IM Nail Left Hip Fracture who failed conservative measures and elected for surgical management of L hip. PMH: hypoTSH, HTN, AF, gait unsteadiness, DM2, PND, CAD, PAD, UTI.   Clinical Impression   Jodi Reeves was seen for OT evaluation this date, POD#1 from above surgery. Pt presents with decreased functional use of her LUE, LLE. Per MD, pt to remain WBAT on her LLE. Pt noted with LUE radial head fx. Awaiting ortho recommendation on WB status for LUE. LUE treated as NWB t/o OT evaluation with sling in place at end of session. Pt also presents with generalized confusion t/o session, and self-reports hallucinating objects on tray or in front of her face that are not there. She provides limited information regarding PLOF/home set-up and is quite tangential during conversations. No caregiver present to determine baseline. PLOF/Home set-up information obtained from chart review. Per chart, pt was living alone independently ~6 months prior to admission. Pt currently requires MAX assist for LB dressing and bathing while in seated position due to pain and limited AROM of L hip. +2 assist for functional mobility to maximize pt safety. Pt education limited 2/2 impaired cognition. Increased time taken to educate pt on LUE/LLE precautions and weight bearing status. OT facilitates bed-level ADL management including set-up of meal tray and assists pt with dialing room phone for her to order breakfast, however, once pt on phone with dietary she is unable to place an order and requests assistance. Pt requires increased multimodal cueing to participate t/o session. Pt would benefit from skilled OT services to address noted impairments and functional limitations (see below for any  additional details) in order to maximize safety and independence while minimizing falls risk and caregiver burden. Upon hospital discharge, recommend STR to maximize pt safety and return to PLOF.   Addendum: Per secure chat with MD Pt to be 50% PWB through LUE. Pt to wear sling for comfort. Can remove for ADL management and to use platform walker.      Follow Up Recommendations  SNF;Supervision/Assistance - 24 hour    Equipment Recommendations  3 in 1 bedside commode    Recommendations for Other Services       Precautions / Restrictions Precautions Precautions: Fall Precaution Comments: High Fall Restrictions Weight Bearing Restrictions: Yes LLE Weight Bearing: Weight bearing as tolerated Other Position/Activity Restrictions: LUE treated as NWB during OT evaluation, sling placed on pt. Awaiting MD orders for weight bearing. Contacted via secure chat.      Mobility Bed Mobility Overal bed mobility: Needs Assistance             General bed mobility comments: Pt significantly pain limited during session, per nsg is refusing pain medications. She requires MAX A to sit in long sitting in bed w/o back support to adjust position. Further mobility deferred for pt safety/comfort. Anticipate +2 assist for all mobility efforts.    Transfers                      Balance Overall balance assessment: Needs assistance Sitting-balance support: No upper extremity supported;Feet supported Sitting balance-Leahy Scale: Poor Sitting balance - Comments: MAX A to sit upright in bed w/o back support.  ADL either performed or assessed with clinical judgement   ADL Overall ADL's : Needs assistance/impaired                                       General ADL Comments: Pt significantly functionally limited 2/2 impaired cognition, increased pain in her lower back, LLE/LUE, decreased functional use of her LUE, and decreased  awareness of deficits. Pt requires MOD/MAX A to long-sit in bed. She has difficulty following VCs during session and was unsafe/unable to attempt functional mobility without +2 assist. Anticipate MAX A for LB ADL management including bathing and dressing with seated lateral leans. Set-up-MIN A for UB ADL tasks including self-feeding, grooming, and UB bathing. MAX A to don LUE sling this date.     Vision Baseline Vision/History: Wears glasses Wears Glasses: At all times Patient Visual Report: Other (comment) Additional Comments: Pt endorses seeing items in room that she knows aren't there such as a fork. she states "When I go to reach for it, it's just air, and I know it's not there".     Perception     Praxis      Pertinent Vitals/Pain Pain Assessment: Faces Faces Pain Scale: Hurts whole lot Pain Location: LLE with mobility attempts, Low back pain Pain Descriptors / Indicators: Aching;Sore;Guarding;Grimacing Pain Intervention(s): Limited activity within patient's tolerance;Monitored during session;Utilized relaxation techniques (Per RN, pt is refusing pain medications.)     Hand Dominance Right   Extremity/Trunk Assessment Upper Extremity Assessment Upper Extremity Assessment: Generalized weakness;LUE deficits/detail LUE Deficits / Details: Awaiting MD recommendations for WB status. Treated as NWB t/o evaluation. OT assists pt with donning LUE sling, as she is recieved without sling in place. LUE: Unable to fully assess due to immobilization LUE Sensation: WNL   Lower Extremity Assessment Lower Extremity Assessment: LLE deficits/detail LLE Deficits / Details: s/p LLE IM Nail, WBAT LLE: Unable to fully assess due to pain LLE Sensation: WNL LLE Coordination: decreased gross motor       Communication Communication Communication: No difficulties   Cognition Arousal/Alertness: Awake/alert Behavior During Therapy: Anxious Overall Cognitive Status: No family/caregiver present to  determine baseline cognitive functioning                                 General Comments: Pt able to answer all A&O questions, but is noted to have some generalized confusion t/o session. When asked about how she broke her hip/elbow pt states "I didn't fall", pt self-reports seeing items that are not there such as a fork floating in front of her. Per RN, pt was hallucinating since prior to surgery.   General Comments       Exercises Other Exercises Other Exercises: Significant time taken to educate pt on WB precautions in LLE/LUE, falls prevention strategies, and role of OT in acute care. Education is limited by cognition this date.   Shoulder Instructions      Home Living Family/patient expects to be discharged to:: Private residence Living Arrangements: Alone Available Help at Discharge: Family;Available PRN/intermittently Type of Home: House Home Access: Stairs to enter [employer] of Steps: 5 Entrance Stairs-Rails: Can reach both Home Layout: Two level;Able to live on main level with bedroom/bathroom Alternate Level Stairs-Number of Steps: flight Alternate Level Stairs-Rails: Can reach both Bathroom Shower/Tub: Walk-in shower         Home Equipment:  Walker - 2 wheels;Cane - single point;Shower seat - built in   Additional Comments: Per pt, son lives nearby and assists with healthcare management.      Prior Functioning/Environment Level of Independence: Independent with assistive device(s)        Comments: Information regarding PLOF obtained from chart review. Pt provides limited information on home set-up/PLOF 2/2 impaired cognition during evaluation. No family/caregiver present. Per chart, pt was independent living alone ~6 months prior to admission. Pt reports having personal caregiver in the home 1x weekly.        OT Problem List: Decreased strength;Decreased coordination;Pain;Decreased activity tolerance;Decreased safety awareness;Impaired  balance (sitting and/or standing);Decreased knowledge of use of DME or AE;Decreased range of motion;Decreased cognition;Decreased knowledge of precautions;Impaired UE functional use      OT Treatment/Interventions: Self-care/ADL training;Therapeutic exercise;Therapeutic activities;DME and/or AE instruction;Patient/family education;Balance training;Energy conservation    OT Goals(Current goals can be found in the care plan section) Acute Rehab OT Goals Patient Stated Goal: To go to rehab. OT Goal Formulation: With patient Time For Goal Achievement: 10/27/20 Potential to Achieve Goals: Good  OT Frequency: Min 2X/week   Barriers to D/C: Inaccessible home environment;Decreased caregiver support          Co-evaluation              AM-PAC OT "6 Clicks" Daily Activity     Outcome Measure Help from another person eating meals?: A Little Help from another person taking care of personal grooming?: A Little Help from another person toileting, which includes using toliet, bedpan, or urinal?: A Lot Help from another person bathing (including washing, rinsing, drying)?: A Lot Help from another person to put on and taking off regular upper body clothing?: A Lot Help from another person to put on and taking off regular lower body clothing?: A Lot 6 Click Score: 14   End of Session Nurse Communication: Mobility status;Weight bearing status;Other (comment) (LUE sling in place at end of session. Pt found without sling.)  Activity Tolerance: Patient limited by pain Patient left: in bed;with call bell/phone within reach;with bed alarm set  OT Visit Diagnosis: Other abnormalities of gait and mobility (R26.89);Pain;History of falling (Z91.81) Pain - Right/Left: Left Pain - part of body: Arm;Hip;Knee                Time: 8502-7741 OT Time Calculation (min): 59 min Charges:  OT General Charges $OT Visit: 1 Visit OT Evaluation $OT Eval Moderate Complexity: 1 Mod OT Treatments $Self Care/Home  Management : 38-52 mins  Shara Blazing, M.S., OTR/L Ascom: [phone] 10/13/20, 11:06 AM

## 2020-10-13 NOTE — Progress Notes (Addendum)
PROGRESS NOTE    Jodi Reeves  DXI:[id number] DOB: 1927/12/02 DOA: 10/10/2020 PCP: [name], Nino Glow, MD   Chief complaint.  Hip pain. Brief Narrative:  Jodi Reeves is a 87 y.o. female with medical history significant for multiple medical problems that are mentioned below, including atrial fibrillation with anticoagulation with Xarelto, who presented to the emergency room with acute onset of left hip pain.  The patient was turning off the light switch when she stepped back and lost her balance and fell on the floor.  She sustained left hip fracture.  She has been seen by Dr. Mack Guise, planning for hip surgery 6/21.   Assessment & Plan:   Active Problems:   Hypothyroidism   Essential hypertension   Permanent atrial fibrillation (HCC)   Chronic diastolic CHF (congestive heart failure) (HCC)   Type 2 diabetes mellitus with diabetic polyneuropathy, without long-term current use of insulin (HCC)   Closed left hip fracture (HCC)   Acute blood loss anemia   Thrombocytopenia (HCC)   Closed intertrochanteric fracture of hip, left, initial encounter ([facility])  Oliguria Notified in the PM that patient's uop 100 ml today. Normal creatinine this morning, tolerating po. Bladder scan 120 ml. Will push oral hydration, give 500 cc bolus, check bmp, measure strict I/os. Consider renal u/s in morning and I/o cath if uop remains poor.   Left hip fracture. Acute blood loss anemia from fracture.   Mild thrombocytopenia secondary to acute blood loss. Patient received a 1 unit PRBC for acute blood loss anemia from hip fracture. Hemoglobin now appears stable. S/p operative repair 6/21. - pt/ot consulted, plan for SNF, SW aware - morphine/hydrocodone/tramadol for pain control, miralax/senna/docusate - home rivaroxaban re-ordered - monitor hgb  Chronic diastolic congestive heart failure. Permanent atrial fibrillation. Patient had episode of rapid ventricular response that is improved.  Cardiology following - resumed home rivaroxaban - cont home bisoprolol and lasix - follow cardiology recs  Oxygen requirement On 2 L, no dyspnea, lungs clear, no documented hypoxia - have messaged nursing about attempt to wean off  Macrocytosis - f/u b12/folate  Type 2 diabetes. Continue sliding scale insulin as well as home tradjenta. Will re-start home metformin as well as kidney function stable and sugars have been up.    DVT prophylaxis: rivaroxaban Code Status: full Family Communication: attempted to update son telephonically 6/22, no answer Disposition Plan:    Status is: Inpatient  Remains inpatient appropriate because:Inpatient level of care appropriate due to severity of illness  Dispo: The patient is from: Home              Anticipated d/c is to: SNF              Patient currently is not medically stable to d/c.   Difficult to place patient No        I/O last 3 completed shifts: In: 66 [P.O.:30; I.V.:800] Out: 2450 [Urine:2250; Blood:200] Total I/O In: 240 [P.O.:240] Out: -      Consultants:  Orthopedics  Procedures: Hip today  Antimicrobials: None  Subjective: Complains of pain with movement. Breathing stable  Objective: Vitals:   10/13/20 0001 10/13/20 0512 10/13/20 0755 10/13/20 1136  BP: 97/60 109/65 104/64 (!) 108/56  Pulse: 77 (!) 109 84 91  Resp:  16 17 17   Temp:  97.8 F (36.6 C) 97.8 F (36.6 C) 98.4 F (36.9 C)  TempSrc:      SpO2:  94%  99%  Weight:      Height:  Intake/Output Summary (Last 24 hours) at 10/13/2020 1319 Last data filed at 10/13/2020 1026 Gross per 24 hour  Intake 1070 ml  Output 775 ml  Net 295 ml   Filed Weights   10/10/20 0230 10/12/20 1429  Weight: 62.2 kg 62.2 kg    Examination:  General exam: Appears calm and comfortable  Respiratory system: Clear to auscultation. Respiratory effort normal. Cardiovascular system: Irregularly irregular, mildly tachycardic.  No JVD, murmurs, rubs,  gallops or clicks.  Gastrointestinal system: Abdomen is nondistended, soft and nontender. No organomegaly or masses felt. Normal bowel sounds heard. Central nervous system: Alert and oriented x3. No focal neurological deficits. Extremities: trace LE edema. : hip bandaged Skin: No rashes, lesions or ulcers Psychiatry: Judgement and insight appear normal. Mood & affect appropriate.     Data Reviewed: I have personally reviewed following labs and imaging studies  CBC: Recent Labs  Lab 10/08/20 1848 10/10/20 0321 10/11/20 0454 10/12/20 0613 10/13/20 0521  WBC 5.6 9.4 9.0 13.0* 9.7  NEUTROABS 3.5 8.1*  --  10.3* 8.2*  HGB 11.9* 10.8* 7.8* 9.8* 8.6*  HCT 36.6 33.1* 24.0* 29.0* 26.2*  MCV 103.4* 104.4* 105.3* 101.0* 104.0*  PLT 196 170 122* 137* 364*   Basic Metabolic Panel: Recent Labs  Lab 10/08/20 1848 10/10/20 0321 10/11/20 0454 10/12/20 0613 10/13/20 0521  NA 134* 138 137 137 138  K 3.7 3.7 4.4 3.7 4.0  CL 101 106 108 104 107  CO2 29 23 24 25 24   GLUCOSE 167* 253* 165* 202* 186*  BUN 18 19 15 15 23   CREATININE 0.62 0.57 0.56 0.61 0.71  CALCIUM 8.6* 8.2* 7.6* 7.8* 7.3*  MG  --   --  2.0 2.0 2.1   GFR: Estimated Creatinine Clearance: 37.9 mL/min (by C-G formula based on SCr of 0.71 mg/dL). Liver Function Tests: Recent Labs  Lab 10/08/20 1848  AST 21  ALT 10  ALKPHOS 62  BILITOT 0.6  PROT 7.6  ALBUMIN 3.9   No results for input(s): LIPASE, AMYLASE in the last 168 hours. No results for input(s): AMMONIA in the last 168 hours. Coagulation Profile: Recent Labs  Lab 10/10/20 0321  INR 2.7*   Cardiac Enzymes: No results for input(s): CKTOTAL, CKMB, CKMBINDEX, TROPONINI in the last 168 hours. BNP (last 3 results) No results for input(s): PROBNP in the last 8760 hours. HbA1C: No results for input(s): HGBA1C in the last 72 hours.  CBG: Recent Labs  Lab 10/12/20 1901 10/12/20 2145 10/12/20 2225 10/13/20 0906 10/13/20 1245  GLUCAP 111* 202* 234* 229*  277*   Lipid Profile: No results for input(s): CHOL, HDL, LDLCALC, TRIG, CHOLHDL, LDLDIRECT in the last 72 hours. Thyroid Function Tests: No results for input(s): TSH, T4TOTAL, FREET4, T3FREE, THYROIDAB in the last 72 hours. Anemia Panel: No results for input(s): VITAMINB12, FOLATE, FERRITIN, TIBC, IRON, RETICCTPCT in the last 72 hours. Sepsis Labs: No results for input(s): PROCALCITON, LATICACIDVEN in the last 168 hours.  Recent Results (from the past 240 hour(s))  Resp Panel by RT-PCR (Flu A&B, Covid) Nasopharyngeal Swab     Status: None   Collection Time: 10/10/20  3:21 AM   Specimen: Nasopharyngeal Swab; Nasopharyngeal(NP) swabs in vial transport medium  Result Value Ref Range Status   SARS Coronavirus 2 by RT PCR NEGATIVE NEGATIVE Final    Comment: (NOTE) SARS-CoV-2 target nucleic acids are NOT DETECTED.  The SARS-CoV-2 RNA is generally detectable in upper respiratory specimens during the acute phase of infection. The lowest concentration of SARS-CoV-2 viral copies  this assay can detect is 138 copies/mL. A negative result does not preclude SARS-Cov-2 infection and should not be used as the sole basis for treatment or other patient management decisions. A negative result may occur with  improper specimen collection/handling, submission of specimen other than nasopharyngeal swab, presence of viral mutation(s) within the areas targeted by this assay, and inadequate number of viral copies(<138 copies/mL). A negative result must be combined with clinical observations, patient history, and epidemiological information. The expected result is Negative.  Fact Sheet for Patients:  EntrepreneurPulse.com.au  Fact Sheet for Healthcare Providers:  IncredibleEmployment.be  This test is no t yet approved or cleared by the [place] FDA and  has been authorized for detection and/or diagnosis of SARS-CoV-2 by FDA under an Emergency Use Authorization  (EUA). This EUA will remain  in effect (meaning this test can be used) for the duration of the COVID-19 declaration under Section 564(b)(1) of the Act, 21 U.S.C.section 360bbb-3(b)(1), unless the authorization is terminated  or revoked sooner.       Influenza A by PCR NEGATIVE NEGATIVE Final   Influenza B by PCR NEGATIVE NEGATIVE Final    Comment: (NOTE) The Xpert Xpress SARS-CoV-2/FLU/RSV plus assay is intended as an aid in the diagnosis of influenza from Nasopharyngeal swab specimens and should not be used as a sole basis for treatment. Nasal washings and aspirates are unacceptable for Xpert Xpress SARS-CoV-2/FLU/RSV testing.  Fact Sheet for Patients: EntrepreneurPulse.com.au  Fact Sheet for Healthcare Providers: IncredibleEmployment.be  This test is not yet approved or cleared by the [place] FDA and has been authorized for detection and/or diagnosis of SARS-CoV-2 by FDA under an Emergency Use Authorization (EUA). This EUA will remain in effect (meaning this test can be used) for the duration of the COVID-19 declaration under Section 564(b)(1) of the Act, 21 U.S.C. section 360bbb-3(b)(1), unless the authorization is terminated or revoked.  Performed at [facility], [address] [place], [place] [address]   Surgical PCR screen     Status: None   Collection Time: 10/10/20 11:20 PM   Specimen: Nasal Mucosa; Nasal Swab  Result Value Ref Range Status   MRSA, PCR NEGATIVE NEGATIVE Final   Staphylococcus aureus NEGATIVE NEGATIVE Final    Comment: (NOTE) The Xpert SA Assay (FDA approved for NASAL specimens in patients 30 years of age and older), is one component of a comprehensive surveillance program. It is not intended to diagnose infection nor to guide or monitor treatment. Performed at [facility], [address] [place], [place] [address]          Radiology Studies: DG HIP OPERATIVE UNILAT W OR  W/O PELVIS LEFT  Result Date: 10/12/2020 CLINICAL DATA:  Left intratrochanteric fracture EXAM: OPERATIVE LEFT HIP WITH PELVIS COMPARISON:  10/10/2020 FLUOROSCOPY TIME:  Radiation Exposure Index (as provided by the fluoroscopic device): Not available If the device does not provide the exposure index: Fluoroscopy Time:  2 minutes 49 seconds Number of Acquired Images:  6 FINDINGS: Medullary rod is noted within the left femur with proximal and distal fixation screws. The proximal screw traverses the femoral neck. Fracture fragments are in near anatomic alignment. IMPRESSION: ORIF of proximal left femoral fracture. Electronically Signed   By: Inez Catalina M.D.   On: 10/12/2020 18:45   DG FEMUR PORT MIN 2 VIEWS LEFT  Result Date: 10/12/2020 CLINICAL DATA:  Postop EXAM: LEFT FEMUR PORTABLE 2 VIEWS COMPARISON:  10/10/2020 FINDINGS: Interval intramedullary rod and screw fixation of left femur for comminuted  intertrochanteric fracture. Multiple displaced lesser trochanteric fracture fragments as before. Gas in the soft tissues consistent with recent surgery. IMPRESSION: Interval ORIF of proximal left femur fracture Electronically Signed   By: Donavan Foil M.D.   On: 10/12/2020 19:52        Scheduled Meds:  acetaminophen  500 mg Oral Q6H   acidophilus  1 capsule Oral Daily   ascorbic acid  2,000 mg Oral Daily   bisoprolol  5 mg Oral BID   Chlorhexidine Gluconate Cloth  6 each Topical Daily   docusate sodium  100 mg Oral BID   vitamin B-12  1,000 mcg Oral Daily   And   folic acid  1 mg Oral Daily   And   vitamin B-6  50 mg Oral Daily   furosemide  20 mg Oral BID   insulin aspart  0-15 Units Subcutaneous TID AC & HS   levothyroxine  50 mcg Oral Q0600   linagliptin  5 mg Oral Daily   magnesium oxide  200 mg Oral Daily   multivitamin with minerals   Oral Daily   omega-3 acid ethyl esters  1,000 mg Oral BID   potassium chloride  10 mEq Oral BID   rivaroxaban  20 mg Oral Q supper   senna  1 tablet  Oral BID   traMADol  50 mg Oral Q6H   Continuous Infusions:   LOS: 3 days    Time spent: 30 minutes    Desma Maxim, MD [facility]   To contact the attending provider between 7A-7P or the covering provider during after hours 7P-7A, please log into the web site [email or web address] and access using universal [place] password for that web site. If you do not have the password, please call the hospital operator.  10/13/2020, 1:19 PM

## 2020-10-13 NOTE — Evaluation (Signed)
Physical Therapy Evaluation Patient Details Name: Jodi Reeves MRN: [id number] DOB: 04/08/1928 Today's Date: 10/13/2020   History of Present Illness  Pt is a 87 y.o. female s/p fall with a diagnosis of non-surgical L radial head fx and L hip fx s/p IM nail. PMH includes balance disorder, HTN, AF, gait unsteadiness, DM2, PND, CAD, PAD, UTI, kyphoplasty.   Clinical Impression  Pt was pleasant and did put forth some effort during the session but ultimately was very limited functionally by both weakness as well as chronic back pain and acute LLE hip pain.  Pt in distress for most functional tasks and required +2 max assist throughout the session.  Pt was able to clear the surface of the bed during transfer attempts but unable to come to full upright position and required constant assist to prevent LOB.  Pt unable to advance either LE during amb attempt.  Will make frequency 7x/wk secondary to pt's very high levels of pain/distress during the session but will follow for possible upgrade to BID if appropriate.  Pt will benefit from PT services in a SNF setting upon discharge to safely address deficits listed in patient problem list for decreased caregiver assistance and eventual return to PLOF.     Follow Up Recommendations SNF;Supervision/Assistance - 24 hour    Equipment Recommendations  Other (comment) (TBD at next venue of care)    Recommendations for Other Services       Precautions / Restrictions Precautions Precautions: Fall Precaution Comments: High Fall Required Braces or Orthoses: Sling Restrictions Weight Bearing Restrictions: Yes LUE Weight Bearing: Partial weight bearing LUE Partial Weight Bearing Percentage or Pounds: 50% LLE Weight Bearing: Weight bearing as tolerated Other Position/Activity Restrictions: Per Dr. Mack Guise pt may be 50% PWB through the LUE with a platform walker, may have sling off with therapy      Mobility  Bed Mobility Overal bed mobility: Needs  Assistance Bed Mobility: Rolling;Sit to Sidelying;Sidelying to Sit Rolling: Max assist;+2 for physical assistance Sidelying to sit: +2 for physical assistance;Max assist     Sit to sidelying: Max assist;+2 for physical assistance General bed mobility comments: Pt with chronic back pain and h/o kyphoplasty, log roll training provided    Transfers Overall transfer level: Needs assistance Equipment used: 2 person hand held assist Transfers: Sit to/from Stand Sit to Stand: +2 physical assistance;Max assist         General transfer comment: Pt able to come to standing with +2 Max assist with support on the L forearm but was unable to come to full upright position and required constant assist to prevent falling backwards  Ambulation/Gait             General Gait Details: Unable  Stairs            Wheelchair Mobility    Modified Rankin (Stroke Patients Only)       Balance Overall balance assessment: Needs assistance Sitting-balance support: No upper extremity supported;Feet supported Sitting balance-Leahy Scale: Poor Sitting balance - Comments: Frequent assist to prevent R lateral LOB   Standing balance support: Bilateral upper extremity supported Standing balance-Leahy Scale: Poor Standing balance comment: Constant assist to prevent LOB                             Pertinent Vitals/Pain Pain Assessment: 0-10 Pain Score: 6  Faces Pain Scale: Hurts whole lot Pain Location: LLE with mobility attempts, Low back pain Pain Descriptors / Indicators:  Aching;Guarding;Grimacing;Moaning Pain Intervention(s): Premedicated before session;Monitored during session;Repositioned    Home Living Family/patient expects to be discharged to:: Private residence Living Arrangements: Alone Available Help at Discharge: Family;Personal care attendant;Available 24 hours/day Type of Home: House Home Access: Ramped entrance;Stairs to enter Entrance Stairs-Rails: Can reach  both Entrance Stairs-Number of Steps: 5 Home Layout: Two level;Able to live on main level with bedroom/bathroom Home Equipment: Gilford Rile - 2 wheels;Cane - single point;Shower seat - built in Additional Comments: Per pt, son lives nearby and assists with healthcare management.    Prior Function Level of Independence: Needs assistance   Gait / Transfers Assistance Needed: Mod Ind HH amb with a RW, multiple falls in the last 6 months  ADL's / Homemaking Assistance Needed: PCA assists with ADLs as needed during the day 7 days/wk, son stays the night  Comments: Information regarding PLOF obtained from chart review. Pt provides limited information on home set-up/PLOF 2/2 impaired cognition during evaluation. No family/caregiver present. Per chart, pt was independent living alone ~6 months prior to admission. Pt reports having personal caregiver in the home 1x weekly.     Hand Dominance   Dominant Hand: Right    Extremity/Trunk Assessment   Upper Extremity Assessment Upper Extremity Assessment: Generalized weakness;LUE deficits/detail LUE Deficits / Details: Awaiting MD recommendations for WB status. Treated as NWB t/o evaluation. OT assists pt with donning LUE sling, as she is recieved without sling in place. LUE: Unable to fully assess due to pain;Unable to fully assess due to immobilization LUE Sensation: WNL    Lower Extremity Assessment Lower Extremity Assessment: Generalized weakness LLE Deficits / Details: s/p LLE IM Nail, WBAT LLE: Unable to fully assess due to pain LLE Sensation: WNL LLE Coordination: decreased gross motor       Communication   Communication: No difficulties  Cognition Arousal/Alertness: Awake/alert Behavior During Therapy: WFL for tasks assessed/performed Overall Cognitive Status: No family/caregiver present to determine baseline cognitive functioning                                 General Comments: Pt able to answer all A&O questions, but  is noted to have some generalized confusion t/o session. When asked about how she broke her hip/elbow pt states "I didn't fall", pt self-reports seeing items that are not there such as a fork floating in front of her. Per RN, pt was hallucinating since prior to surgery.      General Comments      Exercises Total Joint Exercises Hip ABduction/ADduction: AAROM;Strengthening;Both;10 reps Straight Leg Raises: 10 reps;Both;Strengthening;AAROM Other Exercises Other Exercises: Static sitting at EOB for core strengthening and improved activity tolerance x 5 min Other Exercises: Log roll training   Assessment/Plan    PT Assessment Patient needs continued PT services  PT Problem List Decreased strength;Decreased balance;Decreased activity tolerance;Decreased mobility;Decreased knowledge of use of DME;Decreased knowledge of precautions;Pain       PT Treatment Interventions DME instruction;Gait training;Functional mobility training;Therapeutic activities;Therapeutic exercise;Balance training;Patient/family education    PT Goals (Current goals can be found in the Care Plan section)  Acute Rehab PT Goals Patient Stated Goal: To go to rehab. PT Goal Formulation: With patient Time For Goal Achievement: 10/26/20 Potential to Achieve Goals: Fair    Frequency 7X/week   Barriers to discharge Inaccessible home environment      Co-evaluation               AM-PAC PT "6 Clicks" Mobility  Outcome Measure Help needed turning from your back to your side while in a flat bed without using bedrails?: Total Help needed moving from lying on your back to sitting on the side of a flat bed without using bedrails?: Total Help needed moving to and from a bed to a chair (including a wheelchair)?: Total Help needed standing up from a chair using your arms (e.g., wheelchair or bedside chair)?: Total Help needed to walk in hospital room?: Total Help needed climbing 3-5 steps with a railing? : Total 6  Click Score: 6    End of Session Equipment Utilized During Treatment: Gait belt;Oxygen Activity Tolerance: Patient limited by pain Patient left: in bed;with call bell/phone within reach;with bed alarm set;with SCD's reapplied Nurse Communication: Mobility status;Weight bearing status PT Visit Diagnosis: Unsteadiness on feet (R26.81);History of falling (Z91.81);Muscle weakness (generalized) (M62.81);Other abnormalities of gait and mobility (R26.89);Pain Pain - Right/Left: Left Pain - part of body: Hip    Time: 4035-2481 PT Time Calculation (min) (ACUTE ONLY): 27 min   Charges:   PT Evaluation $PT Eval Moderate Complexity: 1 Mod PT Treatments $Therapeutic Activity: 8-22 mins        D. Scott Marquisa Salih PT, DPT 10/13/20, 1:29 PM

## 2020-10-13 NOTE — Progress Notes (Signed)
Progress Note  Patient Name: Jodi Reeves Date of Encounter: 10/13/2020  Primary Cardiologist: Ida Rogue, MD  Subjective   Feels well this AM.  No chest pain, sob, palpitations.  Not having significant L hip pain - tolerated surgery well yesterday.  HRs slightly better this AM.  Inpatient Medications    Scheduled Meds:  acetaminophen  500 mg Oral Q6H   acidophilus  1 capsule Oral Daily   ascorbic acid  2,000 mg Oral Daily   bisoprolol  5 mg Oral BID   Chlorhexidine Gluconate Cloth  6 each Topical Daily   docusate sodium  100 mg Oral BID   vitamin B-12  1,000 mcg Oral Daily   And   folic acid  1 mg Oral Daily   And   vitamin B-6  50 mg Oral Daily   furosemide  20 mg Oral BID   insulin aspart  0-15 Units Subcutaneous TID AC & HS   levothyroxine  50 mcg Oral Q0600   linagliptin  5 mg Oral Daily   magnesium oxide  200 mg Oral Daily   multivitamin with minerals   Oral Daily   omega-3 acid ethyl esters  1,000 mg Oral BID   potassium chloride  10 mEq Oral BID   rivaroxaban  20 mg Oral Q supper   senna  1 tablet Oral BID   traMADol  50 mg Oral Q6H   Continuous Infusions:  PRN Meds: acetaminophen **OR** acetaminophen, alum & mag hydroxide-simeth, bisacodyl, dimethicone, docusate sodium, HYDROcodone-acetaminophen, labetalol, magnesium hydroxide, morphine injection, olopatadine, ondansetron **OR** ondansetron (ZOFRAN) IV, polyethylene glycol, traZODone   Vital Signs    Vitals:   10/12/20 2358 10/13/20 0001 10/13/20 0512 10/13/20 0755  BP: (!) 91/56 97/60 109/65 104/64  Pulse: 78 77 (!) 109 84  Resp: 19  16 17   Temp: 98.2 F (36.8 C)  97.8 F (36.6 C) 97.8 F (36.6 C)  TempSrc:      SpO2: 97%  94%   Weight:      Height:        Intake/Output Summary (Last 24 hours) at 10/13/2020 1131 Last data filed at 10/13/2020 1026 Gross per 24 hour  Intake 1070 ml  Output 775 ml  Net 295 ml   Filed Weights   10/10/20 0230 10/12/20 1429  Weight: 62.2 kg 62.2 kg     Physical Exam   GEN: Well nourished, well developed, in no acute distress.  HEENT: Grossly normal.  Neck: Supple, no JVD, carotid bruits, or masses. Cardiac: IR, IR, no murmurs, rubs, or gallops. No clubbing, cyanosis, edema.  Radials 2+, DP/PT 2+ and equal bilaterally.  Respiratory:  Respirations regular and unlabored, clear to auscultation bilaterally. GI: Soft, nontender, nondistended, BS + x 4. MS: no deformity or atrophy. Skin: warm and dry, no rash. Neuro:  Strength and sensation are intact. Psych: AAOx3.  Normal affect.  Labs    Chemistry Recent Labs  Lab 10/08/20 1848 10/10/20 0321 10/11/20 0454 10/12/20 0613 10/13/20 0521  NA 134*   < > 137 137 138  K 3.7   < > 4.4 3.7 4.0  CL 101   < > 108 104 107  CO2 29   < > 24 25 24   GLUCOSE 167*   < > 165* 202* 186*  BUN 18   < > 15 15 23   CREATININE 0.62   < > 0.56 0.61 0.71  CALCIUM 8.6*   < > 7.6* 7.8* 7.3*  PROT 7.6  --   --   --   --  ALBUMIN 3.9  --   --   --   --   AST 21  --   --   --   --   ALT 10  --   --   --   --   ALKPHOS 62  --   --   --   --   BILITOT 0.6  --   --   --   --   GFRNONAA >60   < > >60 >60 >60  ANIONGAP 4*   < > 5 8 7    < > = values in this interval not displayed.     Hematology Recent Labs  Lab 10/11/20 0454 10/12/20 0613 10/13/20 0521  WBC 9.0 13.0* 9.7  RBC 2.28* 2.87* 2.52*  HGB 7.8* 9.8* 8.6*  HCT 24.0* 29.0* 26.2*  MCV 105.3* 101.0* 104.0*  MCH 34.2* 34.1* 34.1*  MCHC 32.5 33.8 32.8  RDW 15.0 14.6 14.6  PLT 122* 137* 145*    Cardiac Enzymes  Recent Labs  Lab 10/08/20 0848  TROPONINIHS 4      BNP Recent Labs  Lab 10/08/20 1848  BNP 318.1*    Lipids  Lab Results  Component Value Date   CHOL 171 03/10/2020   HDL 67.50 03/10/2020   LDLCALC 87 03/10/2020   LDLDIRECT 130.3 11/27/2008   TRIG 79.0 03/10/2020   CHOLHDL 3 03/10/2020    HbA1c  Lab Results  Component Value Date   HGBA1C 7.8 (H) 10/10/2020    Radiology    DG Elbow 2 Views  Left  Result Date: 10/10/2020 CLINICAL DATA:  Tripped and fell EXAM: LEFT ELBOW - 2 VIEW COMPARISON:  09/24/2014 FINDINGS: Frontal and lateral views of the left elbow demonstrate a minimally displaced intra-articular fracture involving the lateral aspect of the radial head. Large joint effusion. Prominent dorsal soft tissue swelling of the proximal forearm. IMPRESSION: 1. Minimally displaced intra-articular fracture lateral aspect radial head. 2. Large joint effusion. 3. Prominent dorsal soft tissue swelling. Electronically Signed   By: Randa Ngo M.D.   On: 10/10/2020 03:08   CT Head Wo Contrast  Result Date: 10/10/2020 CLINICAL DATA:  Fall EXAM: CT HEAD WITHOUT CONTRAST CT CERVICAL SPINE WITHOUT CONTRAST TECHNIQUE: Multidetector CT imaging of the head and cervical spine was performed following the standard protocol without intravenous contrast. Multiplanar CT image reconstructions of the cervical spine were also generated. COMPARISON:  10/08/2020 FINDINGS: CT HEAD FINDINGS Brain: There is no mass, hemorrhage or extra-axial collection. There is generalized atrophy without lobar predilection. There is hypoattenuation of the periventricular white matter, most commonly indicating chronic ischemic microangiopathy. Vascular: Atherosclerotic calcification of the vertebral and internal carotid arteries at the skull base. No abnormal hyperdensity of the major intracranial arteries or dural venous sinuses. Skull: The visualized skull base, calvarium and extracranial soft tissues are normal. Sinuses/Orbits: No fluid levels or advanced mucosal thickening of the visualized paranasal sinuses. No mastoid or middle ear effusion. The orbits are normal. CT CERVICAL SPINE FINDINGS Alignment: No static subluxation. Facets are aligned. Occipital condyles are normally positioned. Skull base and vertebrae: No acute fracture. Soft tissues and spinal canal: No prevertebral fluid or swelling. No visible canal hematoma. Disc  levels: No advanced spinal canal or neural foraminal stenosis. Multilevel degenerative change. Upper chest: No pneumothorax, pulmonary nodule or pleural effusion. Other: Normal visualized paraspinal cervical soft tissues. IMPRESSION: 1. Chronic ischemic microangiopathy and generalized atrophy without acute intracranial abnormality. 2. No acute fracture or static subluxation of the cervical spine. Electronically Signed  By: Ulyses Jarred M.D.   On: 10/10/2020 03:23   CT Cervical Spine Wo Contrast  Result Date: 10/10/2020 CLINICAL DATA:  Fall EXAM: CT HEAD WITHOUT CONTRAST CT CERVICAL SPINE WITHOUT CONTRAST TECHNIQUE: Multidetector CT imaging of the head and cervical spine was performed following the standard protocol without intravenous contrast. Multiplanar CT image reconstructions of the cervical spine were also generated. COMPARISON:  10/08/2020 FINDINGS: CT HEAD FINDINGS Brain: There is no mass, hemorrhage or extra-axial collection. There is generalized atrophy without lobar predilection. There is hypoattenuation of the periventricular white matter, most commonly indicating chronic ischemic microangiopathy. Vascular: Atherosclerotic calcification of the vertebral and internal carotid arteries at the skull base. No abnormal hyperdensity of the major intracranial arteries or dural venous sinuses. Skull: The visualized skull base, calvarium and extracranial soft tissues are normal. Sinuses/Orbits: No fluid levels or advanced mucosal thickening of the visualized paranasal sinuses. No mastoid or middle ear effusion. The orbits are normal. CT CERVICAL SPINE FINDINGS Alignment: No static subluxation. Facets are aligned. Occipital condyles are normally positioned. Skull base and vertebrae: No acute fracture. Soft tissues and spinal canal: No prevertebral fluid or swelling. No visible canal hematoma. Disc levels: No advanced spinal canal or neural foraminal stenosis. Multilevel degenerative change. Upper chest: No  pneumothorax, pulmonary nodule or pleural effusion. Other: Normal visualized paraspinal cervical soft tissues. IMPRESSION: 1. Chronic ischemic microangiopathy and generalized atrophy without acute intracranial abnormality. 2. No acute fracture or static subluxation of the cervical spine. Electronically Signed   By: Ulyses Jarred M.D.   On: 10/10/2020 03:23   DG Chest Portable 1 View  Result Date: 10/10/2020 CLINICAL DATA:  [place], left hip fracture EXAM: PORTABLE CHEST 1 VIEW COMPARISON:  10/09/2020 FINDINGS: Single frontal view of the chest demonstrates a stable cardiac silhouette. No acute airspace disease, effusion, or pneumothorax. Prior vertebral augmentations at the thoracolumbar junction. Left shoulder arthroplasty. No acute fractures. IMPRESSION: 1. No acute intrathoracic process. Electronically Signed   By: Randa Ngo M.D.   On: 10/10/2020 03:07   DG HIP OPERATIVE UNILAT W OR W/O PELVIS LEFT  Result Date: 10/12/2020 CLINICAL DATA:  Left intratrochanteric fracture EXAM: OPERATIVE LEFT HIP WITH PELVIS COMPARISON:  10/10/2020 FLUOROSCOPY TIME:  Radiation Exposure Index (as provided by the fluoroscopic device): Not available If the device does not provide the exposure index: Fluoroscopy Time:  2 minutes 49 seconds Number of Acquired Images:  6 FINDINGS: Medullary rod is noted within the left femur with proximal and distal fixation screws. The proximal screw traverses the femoral neck. Fracture fragments are in near anatomic alignment. IMPRESSION: ORIF of proximal left femoral fracture. Electronically Signed   By: Inez Catalina M.D.   On: 10/12/2020 18:45   DG Hip Unilat W or Wo Pelvis 2-3 Views Left  Result Date: 10/10/2020 CLINICAL DATA:  Tripped and fell, deformity EXAM: DG HIP (WITH OR WITHOUT PELVIS) 2-3V LEFT COMPARISON:  None. FINDINGS: Frontal view of the pelvis as well as frontal and cross-table lateral views of left hip are obtained. There is a comminuted intertrochanteric left hip  fracture with impaction and varus angulation at the fracture site. No dislocation. Remainder of the bony pelvis is unremarkable. Prior L4 vertebral augmentation. IMPRESSION: 1. Comminuted intertrochanteric left hip fracture with varus angulation and impaction. Electronically Signed   By: Randa Ngo M.D.   On: 10/10/2020 03:06   DG FEMUR PORT MIN 2 VIEWS LEFT  Result Date: 10/12/2020 CLINICAL DATA:  Postop EXAM: LEFT FEMUR PORTABLE 2 VIEWS COMPARISON:  10/10/2020 FINDINGS: Interval  intramedullary rod and screw fixation of left femur for comminuted intertrochanteric fracture. Multiple displaced lesser trochanteric fracture fragments as before. Gas in the soft tissues consistent with recent surgery. IMPRESSION: Interval ORIF of proximal left femur fracture Electronically Signed   By: Donavan Foil M.D.   On: 10/12/2020 19:52    Telemetry    Afib 90's to 1-teens - Personally Reviewed  Cardiac Studies   2D Echocardiogram 7.20.2020  The left ventricle has normal systolic function, with an ejection  fraction of 55-60%. The cavity size was normal. Left ventricular diastolic  Doppler parameters are consistent with impaired relaxation.   2. The right ventricle has normal systolic function. The cavity was  normal. There is no increase in right ventricular wall thickness. Right  ventricular systolic pressure is moderately elevated with an estimated  pressure of 45.8 mmHg.   3. Tricuspid valve regurgitation is mild-moderate.   4. Left atrial size was moderately dilated.   5. Rhythm is atrial fibrillation   Patient Profile     87 y.o. female with a history of permanent atrial fibrillation on xarelto, HTN, HL, hypothyroidism, DMII, nonobstructive CAD, HFpEF, osteoporosis, falls, compression fractures, chronic pain, venous insuff/lymphedema, and TIA, who was admitted 6/19 following fall and L hip fx and has had elevated HRs.  Assessment & Plan    1.  Permanent atrial fibrillation with rapid  ventricular sponsor: Patient with history of permanent atrial fibrillation on twice daily bisoprolol and Xarelto at home.  Admitted with fall and left hip fracture, now status post intramedullary nail on June 21.  Heart rates elevated during admission in the setting of hip pain, back pain (chronic), and reduced bisoprolol dosing.  She is now on her home dose of bisoprolol 5 mg twice daily.  It does not look like she got a dose last night.  States slightly better today.  Continue home dose of beta-blocker.  Xarelto resumed postoperatively.  2.  Left hip fracture: Status post IM nail on June 21.  Per Ortho.  3.  Nonobstructive CAD/chest pain: She has a history of atypical chest pain that is a component of chronic abdominal and epigastric pain.  She does not experience exertional chest pain or dyspnea on exertion.  Continue beta-blocker.  4.  Essential hypertension: Stable on beta-blocker therapy.  5.  Chronic heart failure with preserved ejection fraction: Volume stable.  Continue beta-blocker and oral Lasix.  6.  Macrocytic anemia: H&H 8.6 and 26.2 this morning which is relatively stable.  Follow.  Signed, Murray Hodgkins, NP  10/13/2020, 11:31 AM    For questions or updates, please contact   Please consult [email or web address] for contact info under Cardiology/STEMI.

## 2020-10-13 NOTE — TOC Progression Note (Signed)
Transition of Care [facility]) - Progression Note    Patient Details  Name: Jodi Reeves MRN: [id number] Date of Birth: 1928/01/09  [facility]) CM/SW Ross, RN Phone Number: 10/13/2020, 2:37 PM  Clinical Narrative:     The patient is agreeable to go to SNF for STR, Passr Obtained, Fl2 completed, Bedsearch sent, will review bed offers once obtained       Expected Discharge Plan and Services                                                 Social Determinants of Health (SDOH) Interventions    Readmission Risk Interventions No flowsheet data found.

## 2020-10-14 DIAGNOSIS — S52122A Displaced fracture of head of left radius, initial encounter for closed fracture: Secondary | ICD-10-CM

## 2020-10-14 DIAGNOSIS — E1142 Type 2 diabetes mellitus with diabetic polyneuropathy: Secondary | ICD-10-CM

## 2020-10-14 DIAGNOSIS — S72002A Fracture of unspecified part of neck of left femur, initial encounter for closed fracture: Secondary | ICD-10-CM

## 2020-10-14 LAB — BASIC METABOLIC PANEL
Anion gap: 5 (ref 5–15)
BUN: 33 mg/dL — ABNORMAL HIGH (ref 8–23)
CO2: 26 mmol/L (ref 22–32)
Calcium: 7.4 mg/dL — ABNORMAL LOW (ref 8.9–10.3)
Chloride: 106 mmol/L (ref 98–111)
Creatinine, Ser: 0.65 mg/dL (ref 0.44–1.00)
GFR, Estimated: >60 mL/min (ref >60)
Glucose, Bld: 176 mg/dL — ABNORMAL HIGH (ref 70–99)
Potassium: 4.4 mmol/L (ref 3.5–5.1)
Sodium: 137 mmol/L (ref 135–145)

## 2020-10-14 LAB — GLUCOSE, CAPILLARY
Glucose-Capillary: 195 mg/dL — ABNORMAL HIGH (ref 70–99)
Glucose-Capillary: 276 mg/dL — ABNORMAL HIGH (ref 70–99)
Glucose-Capillary: 241 mg/dL — ABNORMAL HIGH (ref 70–99)
Glucose-Capillary: 267 mg/dL — ABNORMAL HIGH (ref 70–99)

## 2020-10-14 LAB — CBC
HCT: 21.3 % — ABNORMAL LOW (ref 36.0–46.0)
Hemoglobin: 7 g/dL — ABNORMAL LOW (ref 12.0–15.0)
MCH: 33.5 pg (ref 26.0–34.0)
MCHC: 32.9 g/dL (ref 30.0–36.0)
MCV: 101.9 fL — ABNORMAL HIGH (ref 80.0–100.0)
Platelets: 163 10*3/uL (ref 150–400)
RBC: 2.09 MIL/uL — ABNORMAL LOW (ref 3.87–5.11)
RDW: 14.5 % (ref 11.5–15.5)
WBC: 10.1 10*3/uL (ref 4.0–10.5)
nRBC: 0 % (ref 0.0–0.2)

## 2020-10-14 LAB — PREPARE RBC (CROSSMATCH)

## 2020-10-14 MED ORDER — INSULIN GLARGINE 100 UNIT/ML ~~LOC~~ SOLN
5.0000 [IU] | Freq: Every day | SUBCUTANEOUS | Status: DC
  Administered 2020-10-14 – 2020-10-16 (×3): 5 [IU] via SUBCUTANEOUS
  Filled 2020-10-14 (×3): qty 0.05

## 2020-10-14 MED ORDER — SODIUM CHLORIDE 0.9% IV SOLUTION: Freq: Once | INTRAVENOUS | Status: AC

## 2020-10-14 NOTE — Progress Notes (Signed)
Inpatient Diabetes Program Recommendations  AACE/ADA: New Consensus Statement on Inpatient Glycemic Control   Target Ranges:  Prepandial:   less than 140 mg/dL      Peak postprandial:   less than 180 mg/dL (1-2 hours)      Critically ill patients:  140 - 180 mg/dL  Results for Jodi Reeves, Jodi Reeves (MRN [id number]) as of 10/14/2020 07:39  Ref. Range 10/14/2020 04:40  Glucose Latest Ref Range: 70 - 99 mg/dL 176 (H)   Results for Jodi Reeves, Jodi Reeves (MRN [id number]) as of 10/14/2020 07:39  Ref. Range 10/13/2020 09:06 10/13/2020 12:45 10/13/2020 16:53 10/13/2020 21:01  Glucose-Capillary Latest Ref Range: 70 - 99 mg/dL 229 (H) 277 (H) 254 (H) 291 (H)    Review of Glycemic Control  Diabetes history: DM2 Outpatient Diabetes medications: Metformin XR 500 mg BID, Tradjenta 5 mg daily Current orders for Inpatient glycemic control: Novolog 0-15 units AC&HS, Metformin XR 500 mg BID, Tradjenta 5 mg daily  Inpatient Diabetes Program Recommendations:    Insulin: Please consider ordering Novolog 3 units TID with meals for meal coverage if patient eats at least 50% of meals.  NOTE: Patient received Decadron 10 mg x1 on 10/12/20 which is contributing to hyperglycemia.  Thanks, Barnie Alderman, RN, MSN, CDE Diabetes Coordinator Inpatient Diabetes Program [phone] (Team Pager from 8am to 5pm)

## 2020-10-14 NOTE — Progress Notes (Signed)
Subjective:  POD #2 s/p IM fixation for left intertrochanteric hip fracture.  Patient with left radial head fracture as well..   Patient reports left hip and elbow pain as mild.  Patient sitting up in bed.  Tolerating p.o. diet.  Patient currently receiving 1 unit of PRBCs for hemoglobin of 7.  Objective:   VITALS:   Vitals:   10/14/20 0835 10/14/20 1203 10/14/20 1517 10/14/20 1620  BP: 114/61 (!) 99/49 116/88 (!) 103/58  Pulse: 83 88 92 90  Resp: 18 18    Temp: 98.5 F (36.9 C) 98.1 F (36.7 C) 99.1 F (37.3 C) 98.9 F (37.2 C)  TempSrc:   Oral   SpO2: 98% 98% 97% 95%  Weight:      Height:        PHYSICAL EXAM: Left lower extremity Neurovascular intact Sensation intact distally Intact pulses distally Dorsiflexion/Plantar flexion intact Aquacel dressing changed today.  There was saturation under the dressing.  There is no active drainage from her incisions.  There is no erythema ecchymosis or significant swelling. No cellulitis present Compartment soft  LABS  Results for orders placed or performed during the hospital encounter of 10/10/20 (from the past 24 hour(s))  Basic metabolic panel     Status: Abnormal   Collection Time: 10/13/20  7:17 PM  Result Value Ref Range   Sodium 134 (L) 135 - 145 mmol/L   Potassium 4.1 3.5 - 5.1 mmol/L   Chloride 103 98 - 111 mmol/L   CO2 24 22 - 32 mmol/L   Glucose, Bld 314 (H) 70 - 99 mg/dL   BUN 34 (H) 8 - 23 mg/dL   Creatinine, Ser 0.89 0.44 - 1.00 mg/dL   Calcium 7.5 (L) 8.9 - 10.3 mg/dL   GFR, Estimated >60 >60 mL/min   Anion gap 7 5 - 15  Glucose, capillary     Status: Abnormal   Collection Time: 10/13/20  9:01 PM  Result Value Ref Range   Glucose-Capillary 291 (H) 70 - 99 mg/dL   Comment 1 Notify RN   CBC     Status: Abnormal   Collection Time: 10/14/20  4:40 AM  Result Value Ref Range   WBC 10.1 4.0 - 10.5 K/uL   RBC 2.09 (L) 3.87 - 5.11 MIL/uL   Hemoglobin 7.0 (L) 12.0 - 15.0 g/dL   HCT 21.3 (L) 36.0 - 46.0 %    MCV 101.9 (H) 80.0 - 100.0 fL   MCH 33.5 26.0 - 34.0 pg   MCHC 32.9 30.0 - 36.0 g/dL   RDW 14.5 11.5 - 15.5 %   Platelets 163 150 - 400 K/uL   nRBC 0.0 0.0 - 0.2 %  Basic metabolic panel     Status: Abnormal   Collection Time: 10/14/20  4:40 AM  Result Value Ref Range   Sodium 137 135 - 145 mmol/L   Potassium 4.4 3.5 - 5.1 mmol/L   Chloride 106 98 - 111 mmol/L   CO2 26 22 - 32 mmol/L   Glucose, Bld 176 (H) 70 - 99 mg/dL   BUN 33 (H) 8 - 23 mg/dL   Creatinine, Ser 0.65 0.44 - 1.00 mg/dL   Calcium 7.4 (L) 8.9 - 10.3 mg/dL   GFR, Estimated >60 >60 mL/min   Anion gap 5 5 - 15  Glucose, capillary     Status: Abnormal   Collection Time: 10/14/20  8:41 AM  Result Value Ref Range   Glucose-Capillary 195 (H) 70 - 99 mg/dL  Prepare RBC (  crossmatch)     Status: None   Collection Time: 10/14/20  9:36 AM  Result Value Ref Range   Order Confirmation      ORDER PROCESSED BY BLOOD BANK Performed at [facility], [address] [place], [place] [address]   Glucose, capillary     Status: Abnormal   Collection Time: 10/14/20 12:07 PM  Result Value Ref Range   Glucose-Capillary 276 (H) 70 - 99 mg/dL  Type and screen [place]     Status: None (Preliminary result)   Collection Time: 10/14/20  1:03 PM  Result Value Ref Range   ABO/RH(D) A POS    Antibody Screen NEG    Sample Expiration 10/17/2020,2359    Unit Number [id number]    Blood Component Type RED CELLS,LR    Unit division 00    Status of Unit ISSUED    Transfusion Status OK TO TRANSFUSE    Crossmatch Result      Compatible Performed at [facility], [place],  [address]   Glucose, capillary     Status: Abnormal   Collection Time: 10/14/20  4:21 PM  Result Value Ref Range   Glucose-Capillary 241 (H) 70 - 99 mg/dL    DG HIP OPERATIVE UNILAT W OR W/O PELVIS LEFT  Result Date: 10/12/2020 CLINICAL DATA:  Left intratrochanteric fracture EXAM: OPERATIVE LEFT HIP  WITH PELVIS COMPARISON:  10/10/2020 FLUOROSCOPY TIME:  Radiation Exposure Index (as provided by the fluoroscopic device): Not available If the device does not provide the exposure index: Fluoroscopy Time:  2 minutes 49 seconds Number of Acquired Images:  6 FINDINGS: Medullary rod is noted within the left femur with proximal and distal fixation screws. The proximal screw traverses the femoral neck. Fracture fragments are in near anatomic alignment. IMPRESSION: ORIF of proximal left femoral fracture. Electronically Signed   By: Inez Catalina M.D.   On: 10/12/2020 18:45   DG FEMUR PORT MIN 2 VIEWS LEFT  Result Date: 10/12/2020 CLINICAL DATA:  Postop EXAM: LEFT FEMUR PORTABLE 2 VIEWS COMPARISON:  10/10/2020 FINDINGS: Interval intramedullary rod and screw fixation of left femur for comminuted intertrochanteric fracture. Multiple displaced lesser trochanteric fracture fragments as before. Gas in the soft tissues consistent with recent surgery. IMPRESSION: Interval ORIF of proximal left femur fracture Electronically Signed   By: Donavan Foil M.D.   On: 10/12/2020 19:52    Assessment/Plan: 2 Days Post-Op   Active Problems:   Hypothyroidism   Essential hypertension   Permanent atrial fibrillation (HCC)   Chronic diastolic CHF (congestive heart failure) ([place])   Type 2 diabetes mellitus with diabetic polyneuropathy, without long-term current use of insulin (HCC)   Closed left hip fracture (HCC)   Acute blood loss anemia   Thrombocytopenia (HCC)   Closed intertrochanteric fracture of hip, left, initial encounter [facility])  Patient stable in bed.  She is in no acute distress and follows commands on exam.  Recheck labs in the a.m.  Continue physical therapy as patient can tolerate.  Patient is 50% weightbearing on the left lower extremity and weightbearing as tolerated through the left elbow with a platform walker.  Xarelto will be restarted once hemoglobin is stable.  Appreciate cardiology following.    Thornton Park , MD 10/14/2020, 6:12 PM

## 2020-10-14 NOTE — Progress Notes (Signed)
Physical Therapy Treatment Patient Details Name: Jodi Reeves MRN: [id number] DOB: 10/25/1927 Today's Date: 10/14/2020    History of Present Illness Pt is a 87 y.o. female s/p fall with a diagnosis of non-surgical L radial head fx and L hip fx s/p IM nail. PMH includes balance disorder, HTN, AF, gait unsteadiness, DM2, PND, CAD, PAD, UTI, kyphoplasty.    PT Comments    Pt was pleasant and with frequent encouragement and redirection was willing to participate during the session. Pt requires mod-max physical assistance +2 for functional mobility limited by post operative pain and generalized weakness. Pt able to slide LLE in standing but unable to advance RLE to ambulate. Pt will benefit from PT services in a SNF setting upon discharge to safely address deficits listed in patient problem list for decreased caregiver assistance and eventual return to PLOF.     Follow Up Recommendations  SNF;Supervision/Assistance - 24 hour     Equipment Recommendations  Other (comment) (TBD at next venue of care)    Recommendations for Other Services       Precautions / Restrictions Precautions Precautions: Fall Precaution Comments: High Fall Required Braces or Orthoses: Sling Restrictions Weight Bearing Restrictions: Yes LUE Weight Bearing: Weight bearing as tolerated (with PFRW) LLE Weight Bearing: Partial weight bearing LLE Partial Weight Bearing Percentage or Pounds: 50% Other Position/Activity Restrictions: Per Dr. Mack Guise pt may WBAT throught the LUE with a platform walker, may have sling off with therapy.    Mobility  Bed Mobility Overal bed mobility: Needs Assistance Bed Mobility: Rolling;Sit to Sidelying;Sidelying to Sit Rolling: Max assist;+2 for physical assistance Sidelying to sit: +2 for physical assistance;Max assist     Sit to sidelying: Max assist;+2 for physical assistance General bed mobility comments: +2 assist for LEs and trunk control    Transfers Overall  transfer level: Needs assistance Equipment used: 2 person hand held assist Transfers: Sit to/from Stand Sit to Stand: +2 physical assistance;Max assist         General transfer comment: Pt performed sit<>stand x4 with +2 Max assist with support on the L forearm but was unable to come to full upright position and required constant assist to prevent falling backwards  Ambulation/Gait             General Gait Details: Unable   Stairs             Wheelchair Mobility    Modified Rankin (Stroke Patients Only)       Balance Overall balance assessment: Needs assistance Sitting-balance support: No upper extremity supported;Feet supported Sitting balance-Leahy Scale: Poor Sitting balance - Comments: Frequent assist to prevent R lateral LOB   Standing balance support: Bilateral upper extremity supported Standing balance-Leahy Scale: Poor Standing balance comment: Constant assist to prevent LOB                            Cognition Arousal/Alertness: Awake/alert Behavior During Therapy: WFL for tasks assessed/performed Overall Cognitive Status: No family/caregiver present to determine baseline cognitive functioning                                 General Comments: Generalized confusion. Perseverating on kyphoplasty procedure and unsure what surgery she had performed.      Exercises Total Joint Exercises Ankle Circles/Pumps: AROM;Strengthening;Both;10 reps;Supine Quad Sets: Strengthening;Both;10 reps;Supine Gluteal Sets: Strengthening;Both;10 reps;Supine Hip ABduction/ADduction: AAROM;Both;10 reps;Supine Straight Leg Raises: AAROM;Strengthening;Both;10 reps;Supine;Other (  comment) (x2) Long Arc Quad: AROM;Strengthening;Both;10 reps;Seated Other Exercises Other Exercises: Static sitting at EOB for core strengthening and improved activity tolerance Other Exercises: Static standing for max tolerance of 15-20 sec with max assist +2 for improved  activity tolerance. Pt able to slide LLE but unable to advance RLE.    General Comments        Pertinent Vitals/Pain Pain Assessment: 0-10 Pain Score: 4  Pain Location: L hip, low back, and abdomen Pain Descriptors / Indicators: Aching;Guarding;Grimacing;Moaning Pain Intervention(s): Monitored during session;Repositioned    Home Living                      Prior Function            PT Goals (current goals can now be found in the care plan section) Progress towards PT goals: Progressing toward goals    Frequency    7X/week      PT Plan Current plan remains appropriate    Co-evaluation              AM-PAC PT "6 Clicks" Mobility   Outcome Measure  Help needed turning from your back to your side while in a flat bed without using bedrails?: Total Help needed moving from lying on your back to sitting on the side of a flat bed without using bedrails?: Total Help needed moving to and from a bed to a chair (including a wheelchair)?: Total Help needed standing up from a chair using your arms (e.g., wheelchair or bedside chair)?: Total Help needed to walk in hospital room?: Total Help needed climbing 3-5 steps with a railing? : Total 6 Click Score: 6    End of Session Equipment Utilized During Treatment: Gait belt Activity Tolerance: Patient limited by pain Patient left: in bed;with call bell/phone within reach;with bed alarm set;with SCD's reapplied Nurse Communication: Mobility status;Weight bearing status PT Visit Diagnosis: Unsteadiness on feet (R26.81);History of falling (Z91.81);Muscle weakness (generalized) (M62.81);Other abnormalities of gait and mobility (R26.89);Pain Pain - Right/Left: Left Pain - part of body: Hip     Time: 4580-9983 PT Time Calculation (min) (ACUTE ONLY): 39 min  Charges:                        Dayton Scrape SPT 10/14/20, 1:33 PM

## 2020-10-14 NOTE — Progress Notes (Signed)
OT Cancellation Note  Patient Details Name: REILEY BERTAGNOLLI MRN: [id number] DOB: 08-07-1927   Cancelled Treatment:    Reason Eval/Treat Not Completed: Other (comment). Pt currently receiving blood transfusion. Will re-attempt at later date/time at medically appropriate.   Hanley Hays, MPH, MS, OTR/L ascom [phone] 10/14/20, 4:15 PM

## 2020-10-14 NOTE — Progress Notes (Signed)
Progress Note  Patient Name: Jodi Reeves Date of Encounter: 10/14/2020  [facility] Cardiologist: Ida Rogue, MD   Subjective   Lots of concerns today, Concerned about lack of sleep, concerned about treatment of her pain without getting overmedicated, concerned about no one is checking her legs for scratches in the setting of lymphedema, concerned about leaving the hospital, concerned about family who are too busy to visit, concerned she might have fluid among other things Currently resting comfortably, no active issues  Inpatient Medications    Scheduled Meds:  acidophilus  1 capsule Oral Daily   ascorbic acid  2,000 mg Oral Daily   bisoprolol  5 mg Oral BID   Chlorhexidine Gluconate Cloth  6 each Topical Daily   docusate sodium  100 mg Oral BID   vitamin B-12  1,000 mcg Oral Daily   And   folic acid  1 mg Oral Daily   And   vitamin B-6  50 mg Oral Daily   furosemide  20 mg Oral BID   insulin aspart  0-15 Units Subcutaneous TID AC & HS   insulin glargine  5 Units Subcutaneous Daily   levothyroxine  50 mcg Oral Q0600   linagliptin  5 mg Oral Daily   magnesium oxide  200 mg Oral Daily   metFORMIN  500 mg Oral BID AC   multivitamin with minerals   Oral Daily   omega-3 acid ethyl esters  1,000 mg Oral BID   polyethylene glycol  17 g Oral Daily   potassium chloride  10 mEq Oral BID   senna  1 tablet Oral BID   traMADol  50 mg Oral Q6H   Continuous Infusions:  PRN Meds: acetaminophen **OR** acetaminophen, alum & mag hydroxide-simeth, bisacodyl, dimethicone, docusate sodium, HYDROcodone-acetaminophen, labetalol, magnesium hydroxide, morphine injection, olopatadine, ondansetron **OR** ondansetron (ZOFRAN) IV, traZODone   Vital Signs    Vitals:   10/14/20 0835 10/14/20 1203 10/14/20 1517 10/14/20 1620  BP: 114/61 (!) 99/49 116/88 (!) 103/58  Pulse: 83 88 92 90  Resp: 18 18    Temp: 98.5 F (36.9 C) 98.1 F (36.7 C) 99.1 F (37.3 C) 98.9 F (37.2 C)   TempSrc:   Oral   SpO2: 98% 98% 97% 95%  Weight:      Height:        Intake/Output Summary (Last 24 hours) at 10/14/2020 1651 Last data filed at 10/14/2020 1530 Gross per 24 hour  Intake 877 ml  Output 200 ml  Net 677 ml   Last 3 Weights 10/12/2020 10/10/2020 10/08/2020  Weight (lbs) 137 lb 2 oz 137 lb 2 oz 137 lb 2 oz  Weight (kg) 62.2 kg 62.2 kg 62.2 kg      Telemetry    Atrial fibrillation rate 90- Personally Reviewed  ECG    - Personally Reviewed  Physical Exam   GEN: No acute distress.   Neck: No JVD Cardiac: Irregularly irregular, no murmurs, rubs, or gallops.  Respiratory: Clear to auscultation bilaterally. GI: Soft, nontender, non-distended  MS: No edema; No deformity. Neuro:  Nonfocal  Psych: Normal affect   Labs    High Sensitivity Troponin:   Recent Labs  Lab 10/08/20 0848  TROPONINIHS 4      Chemistry Recent Labs  Lab 10/08/20 1848 10/10/20 0321 10/13/20 0521 10/13/20 1917 10/14/20 0440  NA 134*   < > 138 134* 137  K 3.7   < > 4.0 4.1 4.4  CL 101   < > 107  103 106  CO2 29   < > 24 24 26   GLUCOSE 167*   < > 186* 314* 176*  BUN 18   < > 23 34* 33*  CREATININE 0.62   < > 0.71 0.89 0.65  CALCIUM 8.6*   < > 7.3* 7.5* 7.4*  PROT 7.6  --   --   --   --   ALBUMIN 3.9  --   --   --   --   AST 21  --   --   --   --   ALT 10  --   --   --   --   ALKPHOS 62  --   --   --   --   BILITOT 0.6  --   --   --   --   GFRNONAA >60   < > >60 >60 >60  ANIONGAP 4*   < > 7 7 5    < > = values in this interval not displayed.     Hematology Recent Labs  Lab 10/12/20 0613 10/13/20 0521 10/14/20 0440  WBC 13.0* 9.7 10.1  RBC 2.87* 2.52* 2.09*  HGB 9.8* 8.6* 7.0*  HCT 29.0* 26.2* 21.3*  MCV 101.0* 104.0* 101.9*  MCH 34.1* 34.1* 33.5  MCHC 33.8 32.8 32.9  RDW 14.6 14.6 14.5  PLT 137* 145* 163    BNP Recent Labs  Lab 10/08/20 1848  BNP 318.1*     DDimer No results for input(s): DDIMER in the last 168 hours.   Radiology    DG HIP OPERATIVE  UNILAT W OR W/O PELVIS LEFT  Result Date: 10/12/2020 CLINICAL DATA:  Left intratrochanteric fracture EXAM: OPERATIVE LEFT HIP WITH PELVIS COMPARISON:  10/10/2020 FLUOROSCOPY TIME:  Radiation Exposure Index (as provided by the fluoroscopic device): Not available If the device does not provide the exposure index: Fluoroscopy Time:  2 minutes 49 seconds Number of Acquired Images:  6 FINDINGS: Medullary rod is noted within the left femur with proximal and distal fixation screws. The proximal screw traverses the femoral neck. Fracture fragments are in near anatomic alignment. IMPRESSION: ORIF of proximal left femoral fracture. Electronically Signed   By: Inez Catalina M.D.   On: 10/12/2020 18:45   DG FEMUR PORT MIN 2 VIEWS LEFT  Result Date: 10/12/2020 CLINICAL DATA:  Postop EXAM: LEFT FEMUR PORTABLE 2 VIEWS COMPARISON:  10/10/2020 FINDINGS: Interval intramedullary rod and screw fixation of left femur for comminuted intertrochanteric fracture. Multiple displaced lesser trochanteric fracture fragments as before. Gas in the soft tissues consistent with recent surgery. IMPRESSION: Interval ORIF of proximal left femur fracture Electronically Signed   By: Donavan Foil M.D.   On: 10/12/2020 19:52    Cardiac Studies  No recent echocardiogram  Patient Profile     87 y.o. female with a history of permanent atrial fibrillation on xarelto, HTN, HL, hypothyroidism, DMII, nonobstructive CAD, HFpEF, osteoporosis, falls, compression fractures, chronic pain, venous insuff/lymphedema, and TIA, who was admitted 6/19 following fall and L hip fx and has had elevated HRs.   Assessment & Plan    1.  Permanent atrial fibrillation with rapid ventricular sponsor:  on twice daily bisoprolol Rate relatively well controlled Restart Xarelto once clinically stable, if further anemia, could guaiac stools  2.  Left hip fracture:  Status post IM nail on June 21.  Per Ortho.  3.  Nonobstructive CAD/chest pain:  Long history  atypical chest pain, no further work-up at this time  Continue beta-blocker.  4.  Essential hypertension:  Stable on beta-blocker therapy.  5.  Chronic heart failure with preserved ejection fraction:  Volume stable.  Continue beta-blocker and oral Lasix.   6.  Macrocytic anemia:  H&H 7.0 Received transfusion   7.  Anxiety Reassurance provided, Long discussion with her concerning need to participate in rehab   Total encounter time more than 35 minutes  Greater than 50% was spent in counseling and coordination of care with the patient   For questions or updates, please contact [place] Please consult [email or web address] for contact info under        Signed, Ida Rogue, MD  10/14/2020, 4:51 PM

## 2020-10-14 NOTE — Care Management Important Message (Signed)
Important Message  Patient Details  Name: Jodi Reeves MRN: [id number] Date of Birth: May 29, 1927   Medicare Important Message Given:  Yes     Juliann Pulse A Dmitry Macomber 10/14/2020, 2:19 PM

## 2020-10-14 NOTE — TOC Progression Note (Signed)
Transition of Care [facility]) - Progression Note    Patient Details  Name: Jodi Reeves MRN: [id number] Date of Birth: August 01, 1927  [facility] CM/SW [place], RN Phone Number: 10/14/2020, 7:28 AM  Clinical Narrative:      Sent message to other facilities requesting them to review the patient for a bed offer, awaiting offers      Expected Discharge Plan and Services                                                 Social Determinants of Health (SDOH) Interventions    Readmission Risk Interventions No flowsheet data found.

## 2020-10-14 NOTE — TOC Progression Note (Signed)
Transition of Care [facility]) - Progression Note    Patient Details  Name: Jodi Reeves MRN: [id number] Date of Birth: [birth year]  Transition of Care [facility]) CM/SW [place], RN Phone Number: 10/14/2020, 4:36 PM  Clinical Narrative:     Spoke with the patient to discuss Bed offers and star rating, she stated that she cant discuss this right now she has blood going and she is too weak and tired and asked that I discuss in the morning       Expected Discharge Plan and Services                                                 Social Determinants of Health (SDOH) Interventions    Readmission Risk Interventions No flowsheet data found.

## 2020-10-14 NOTE — Plan of Care (Signed)

## 2020-10-14 NOTE — Progress Notes (Addendum)
PROGRESS NOTE    Jodi Reeves  XIP:[id number] DOB: November 13, 1927 DOA: 10/10/2020 PCP: [name], Nino Glow, MD   Chief complaint.  Hip pain. Brief Narrative:  Jodi Reeves is a 87 y.o. female with medical history significant for multiple medical problems that are mentioned below, including atrial fibrillation with anticoagulation with Xarelto, who presented to the emergency room with acute onset of left hip pain.  The patient was turning off the light switch when she stepped back and lost her balance and fell on the floor.  She sustained left hip fracture.  She has been seen by Dr. Mack Guise, planning for hip surgery 6/21.   Assessment & Plan:   Active Problems:   Hypothyroidism   Essential hypertension   Permanent atrial fibrillation (HCC)   Chronic diastolic CHF (congestive heart failure) (HCC)   Type 2 diabetes mellitus with diabetic polyneuropathy, without long-term current use of insulin (HCC)   Closed left hip fracture (HCC)   Acute blood loss anemia   Thrombocytopenia (HCC)   Closed intertrochanteric fracture of hip, left, initial encounter ([place])  Left hip fracture. S/p operative repair 6/21.  - pt/ot consulted, plan for SNF, SW aware - morphine/hydrocodone/tramadol for pain control, miralax/senna/docusate  Delirium Mild. - delirium precautions  Acute blood loss anemia 2/2 hip fracture. Has received 1 unit and hgb has down trended to 7 today - transfuse 1 additional unit and continue to monitor - hold home rivaroxaban  Chronic diastolic congestive heart failure. Permanent atrial fibrillation. Patient had episode of rapid ventricular response that is improved. Cardiology following - resumed home rivaroxaban - cont home bisoprolol and lasix - follow cardiology recs  Oxygen requirement Now weaned off O2 - monitor  Macrocytosis 12/folate wnl  Type 2 diabetes. Continue sliding scale insulin as well as home tradjenta. Pt refuses metformin. Will start  lantus 5    DVT prophylaxis: SCDs Code Status: full Family Communication: updated son telephonically 6/23 Disposition Plan: SNF  Status is: Inpatient  Remains inpatient appropriate because:Inpatient level of care appropriate due to severity of illness  Dispo: The patient is from: Home              Anticipated d/c is to: SNF              Patient currently is not medically stable to d/c.   Difficult to place patient No     I/O last 3 completed shifts: In: 673 [P.O.:510; Other:237] Out: 875 [Urine:875] No intake/output data recorded.     Consultants:  Orthopedics  Procedures: Hip today  Antimicrobials: None  Subjective: Complains of pain with movement. Breathing stable  Objective: Vitals:   10/13/20 1946 10/14/20 0005 10/14/20 0414 10/14/20 0835  BP: (!) 92/50 (!) 99/58 109/72 114/61  Pulse: 100 90 80 83  Resp: 19 16 16 18   Temp: 98.5 F (36.9 C) 98 F (36.7 C) (!) 97.5 F (36.4 C) 98.5 F (36.9 C)  TempSrc:      SpO2: 100% 100% 100% 98%  Weight:      Height:        Intake/Output Summary (Last 24 hours) at 10/14/2020 0933 Last data filed at 10/14/2020 0506 Gross per 24 hour  Intake 717 ml  Output 300 ml  Net 417 ml   Filed Weights   10/10/20 0230 10/12/20 1429  Weight: 62.2 kg 62.2 kg    Examination:  General exam: Appears calm and comfortable  Respiratory system: Clear to auscultation. Respiratory effort normal. Cardiovascular system: Irregularly irregular, mildly tachycardic.  No JVD, murmurs, rubs, gallops or clicks.  Gastrointestinal system: Abdomen is nondistended, soft and nontender. No organomegaly or masses felt. Normal bowel sounds heard. Central nervous system: Alert and oriented x3. No focal neurological deficits. Extremities: trace LE edema. : hip bandaged Skin: No rashes, lesions or ulcers Psychiatry: Judgement and insight appear normal. Mood & affect appropriate.     Data Reviewed: I have personally reviewed following labs and  imaging studies  CBC: Recent Labs  Lab 10/08/20 1848 10/10/20 0321 10/11/20 0454 10/12/20 0613 10/13/20 0521 10/14/20 0440  WBC 5.6 9.4 9.0 13.0* 9.7 10.1  NEUTROABS 3.5 8.1*  --  10.3* 8.2*  --   HGB 11.9* 10.8* 7.8* 9.8* 8.6* 7.0*  HCT 36.6 33.1* 24.0* 29.0* 26.2* 21.3*  MCV 103.4* 104.4* 105.3* 101.0* 104.0* 101.9*  PLT 196 170 122* 137* 145* 277   Basic Metabolic Panel: Recent Labs  Lab 10/11/20 0454 10/12/20 0613 10/13/20 0521 10/13/20 1917 10/14/20 0440  NA 137 137 138 134* 137  K 4.4 3.7 4.0 4.1 4.4  CL 108 104 107 103 106  CO2 24 25 24 24 26   GLUCOSE 165* 202* 186* 314* 176*  BUN 15 15 23  34* 33*  CREATININE 0.56 0.61 0.71 0.89 0.65  CALCIUM 7.6* 7.8* 7.3* 7.5* 7.4*  MG 2.0 2.0 2.1  --   --    GFR: Estimated Creatinine Clearance: 37.9 mL/min (by C-G formula based on SCr of 0.65 mg/dL). Liver Function Tests: Recent Labs  Lab 10/08/20 1848  AST 21  ALT 10  ALKPHOS 62  BILITOT 0.6  PROT 7.6  ALBUMIN 3.9   No results for input(s): LIPASE, AMYLASE in the last 168 hours. No results for input(s): AMMONIA in the last 168 hours. Coagulation Profile: Recent Labs  Lab 10/10/20 0321  INR 2.7*   Cardiac Enzymes: No results for input(s): CKTOTAL, CKMB, CKMBINDEX, TROPONINI in the last 168 hours. BNP (last 3 results) No results for input(s): PROBNP in the last 8760 hours. HbA1C: No results for input(s): HGBA1C in the last 72 hours.  CBG: Recent Labs  Lab 10/13/20 0906 10/13/20 1245 10/13/20 1653 10/13/20 2101 10/14/20 0841  GLUCAP 229* 277* 254* 291* 195*   Lipid Profile: No results for input(s): CHOL, HDL, LDLCALC, TRIG, CHOLHDL, LDLDIRECT in the last 72 hours. Thyroid Function Tests: No results for input(s): TSH, T4TOTAL, FREET4, T3FREE, THYROIDAB in the last 72 hours. Anemia Panel: Recent Labs    10/13/20 0521 10/13/20 1415  VITAMINB12  --  4,418*  FOLATE 33.0  --    Sepsis Labs: No results for input(s): PROCALCITON, LATICACIDVEN in  the last 168 hours.  Recent Results (from the past 240 hour(s))  Resp Panel by RT-PCR (Flu A&B, Covid) Nasopharyngeal Swab     Status: None   Collection Time: 10/10/20  3:21 AM   Specimen: Nasopharyngeal Swab; Nasopharyngeal(NP) swabs in vial transport medium  Result Value Ref Range Status   SARS Coronavirus 2 by RT PCR NEGATIVE NEGATIVE Final    Comment: (NOTE) SARS-CoV-2 target nucleic acids are NOT DETECTED.  The SARS-CoV-2 RNA is generally detectable in upper respiratory specimens during the acute phase of infection. The lowest concentration of SARS-CoV-2 viral copies this assay can detect is 138 copies/mL. A negative result does not preclude SARS-Cov-2 infection and should not be used as the sole basis for treatment or other patient management decisions. A negative result may occur with  improper specimen collection/handling, submission of specimen other than nasopharyngeal swab, presence of viral mutation(s) within the areas  targeted by this assay, and inadequate number of viral copies(<138 copies/mL). A negative result must be combined with clinical observations, patient history, and epidemiological information. The expected result is Negative.  Fact Sheet for Patients:  EntrepreneurPulse.com.au  Fact Sheet for Healthcare Providers:  IncredibleEmployment.be  This test is no t yet approved or cleared by the [place] FDA and  has been authorized for detection and/or diagnosis of SARS-CoV-2 by FDA under an Emergency Use Authorization (EUA). This EUA will remain  in effect (meaning this test can be used) for the duration of the COVID-19 declaration under Section 564(b)(1) of the Act, 21 U.S.C.section 360bbb-3(b)(1), unless the authorization is terminated  or revoked sooner.       Influenza A by PCR NEGATIVE NEGATIVE Final   Influenza B by PCR NEGATIVE NEGATIVE Final    Comment: (NOTE) The Xpert Xpress SARS-CoV-2/FLU/RSV plus assay  is intended as an aid in the diagnosis of influenza from Nasopharyngeal swab specimens and should not be used as a sole basis for treatment. Nasal washings and aspirates are unacceptable for Xpert Xpress SARS-CoV-2/FLU/RSV testing.  Fact Sheet for Patients: EntrepreneurPulse.com.au  Fact Sheet for Healthcare Providers: IncredibleEmployment.be  This test is not yet approved or cleared by the [place] FDA and has been authorized for detection and/or diagnosis of SARS-CoV-2 by FDA under an Emergency Use Authorization (EUA). This EUA will remain in effect (meaning this test can be used) for the duration of the COVID-19 declaration under Section 564(b)(1) of the Act, 21 U.S.C. section 360bbb-3(b)(1), unless the authorization is terminated or revoked.  Performed at [facility], [address], [place], [place] [address]   Surgical PCR screen     Status: None   Collection Time: 10/10/20 11:20 PM   Specimen: Nasal Mucosa; Nasal Swab  Result Value Ref Range Status   MRSA, PCR NEGATIVE NEGATIVE Final   Staphylococcus aureus NEGATIVE NEGATIVE Final    Comment: (NOTE) The Xpert SA Assay (FDA approved for NASAL specimens in patients 109 years of age and older), is one component of a comprehensive surveillance program. It is not intended to diagnose infection nor to guide or monitor treatment. Performed at [facility], [address] [place], [place] [address]          Radiology Studies: DG HIP OPERATIVE UNILAT W OR W/O PELVIS LEFT  Result Date: 10/12/2020 CLINICAL DATA:  Left intratrochanteric fracture EXAM: OPERATIVE LEFT HIP WITH PELVIS COMPARISON:  10/10/2020 FLUOROSCOPY TIME:  Radiation Exposure Index (as provided by the fluoroscopic device): Not available If the device does not provide the exposure index: Fluoroscopy Time:  2 minutes 49 seconds Number of Acquired Images:  6 FINDINGS: Medullary rod is noted within the  left femur with proximal and distal fixation screws. The proximal screw traverses the femoral neck. Fracture fragments are in near anatomic alignment. IMPRESSION: ORIF of proximal left femoral fracture. Electronically Signed   By: Inez Catalina M.D.   On: 10/12/2020 18:45   DG FEMUR PORT MIN 2 VIEWS LEFT  Result Date: 10/12/2020 CLINICAL DATA:  Postop EXAM: LEFT FEMUR PORTABLE 2 VIEWS COMPARISON:  10/10/2020 FINDINGS: Interval intramedullary rod and screw fixation of left femur for comminuted intertrochanteric fracture. Multiple displaced lesser trochanteric fracture fragments as before. Gas in the soft tissues consistent with recent surgery. IMPRESSION: Interval ORIF of proximal left femur fracture Electronically Signed   By: Donavan Foil M.D.   On: 10/12/2020 19:52        Scheduled Meds:  acidophilus  1 capsule  Oral Daily   ascorbic acid  2,000 mg Oral Daily   bisoprolol  5 mg Oral BID   Chlorhexidine Gluconate Cloth  6 each Topical Daily   docusate sodium  100 mg Oral BID   vitamin B-12  1,000 mcg Oral Daily   And   folic acid  1 mg Oral Daily   And   vitamin B-6  50 mg Oral Daily   furosemide  20 mg Oral BID   insulin aspart  0-15 Units Subcutaneous TID AC & HS   levothyroxine  50 mcg Oral Q0600   linagliptin  5 mg Oral Daily   magnesium oxide  200 mg Oral Daily   metFORMIN  500 mg Oral BID AC   multivitamin with minerals   Oral Daily   omega-3 acid ethyl esters  1,000 mg Oral BID   polyethylene glycol  17 g Oral Daily   potassium chloride  10 mEq Oral BID   rivaroxaban  20 mg Oral Q supper   senna  1 tablet Oral BID   traMADol  50 mg Oral Q6H   Continuous Infusions:   LOS: 4 days    Time spent: 30 minutes    Desma Maxim, MD [facility]   To contact the attending provider between 7A-7P or the covering provider during after hours 7P-7A, please log into the web site [email or web address] and access using universal [place] password for that web site. If you do not  have the password, please call the hospital operator.  10/14/2020, 9:33 AM

## 2020-10-15 LAB — TYPE AND SCREEN
ABO/RH(D): A POS
Antibody Screen: NEGATIVE
Unit division: 0

## 2020-10-15 LAB — GLUCOSE, CAPILLARY
Glucose-Capillary: 165 mg/dL — ABNORMAL HIGH (ref 70–99)
Glucose-Capillary: 234 mg/dL — ABNORMAL HIGH (ref 70–99)
Glucose-Capillary: 197 mg/dL — ABNORMAL HIGH (ref 70–99)
Glucose-Capillary: 212 mg/dL — ABNORMAL HIGH (ref 70–99)
Glucose-Capillary: 282 mg/dL — ABNORMAL HIGH (ref 70–99)

## 2020-10-15 LAB — CBC
HCT: 26.5 % — ABNORMAL LOW (ref 36.0–46.0)
Hemoglobin: 8.9 g/dL — ABNORMAL LOW (ref 12.0–15.0)
MCH: 34 pg (ref 26.0–34.0)
MCHC: 33.6 g/dL (ref 30.0–36.0)
MCV: 101.1 fL — ABNORMAL HIGH (ref 80.0–100.0)
Platelets: 167 10*3/uL (ref 150–400)
RBC: 2.62 MIL/uL — ABNORMAL LOW (ref 3.87–5.11)
RDW: 16 % — ABNORMAL HIGH (ref 11.5–15.5)
WBC: 9.3 10*3/uL (ref 4.0–10.5)
nRBC: 0.4 % — ABNORMAL HIGH (ref 0.0–0.2)

## 2020-10-15 LAB — BPAM RBC
Blood Product Expiration Date: 202207232359
ISSUE DATE / TIME: 202206231521
Unit Type and Rh: 6200

## 2020-10-15 LAB — BASIC METABOLIC PANEL
Anion gap: 5 (ref 5–15)
BUN: 25 mg/dL — ABNORMAL HIGH (ref 8–23)
CO2: 26 mmol/L (ref 22–32)
Calcium: 7.3 mg/dL — ABNORMAL LOW (ref 8.9–10.3)
Chloride: 107 mmol/L (ref 98–111)
Creatinine, Ser: 0.54 mg/dL (ref 0.44–1.00)
GFR, Estimated: >60 mL/min (ref >60)
Glucose, Bld: 132 mg/dL — ABNORMAL HIGH (ref 70–99)
Potassium: 4.2 mmol/L (ref 3.5–5.1)
Sodium: 138 mmol/L (ref 135–145)

## 2020-10-15 MED ORDER — KETOROLAC TROMETHAMINE 30 MG/ML IJ SOLN
30.0000 mg | Freq: Three times a day (TID) | INTRAMUSCULAR | Status: DC | PRN
  Administered 2020-10-15 – 2020-10-16 (×3): 30 mg via INTRAVENOUS
  Filled 2020-10-15 (×3): qty 1

## 2020-10-15 NOTE — Progress Notes (Signed)
Physical Therapy Treatment Patient Details Name: Jodi Reeves MRN: [id number] DOB: [birth year] Today's Date: 10/15/2020    History of Present Illness Pt is a 87 y.o. female s/p fall with a diagnosis of non-surgical L radial head fx and L hip fx s/p IM nail. PMH includes balance disorder, HTN, AF, gait unsteadiness, DM2, PND, CAD, PAD, UTI, kyphoplasty.    PT Comments    Pt anxious during the session and required frequent encouragement for increased effort as well as therapeutic rest breaks secondary to chronic back pain and acute L hip pain.  Pt required +2 max A for all functional tasks and was unable to come to full upright standing despite multiple attempts and heavy cuing for sequencing as well as introduction of a platform walker. Pt will benefit from a trial of PT services in a SNF setting upon discharge to safely address deficits listed in patient problem list for decreased caregiver assistance and eventual return to PLOF.    Follow Up Recommendations  SNF;Supervision/Assistance - 24 hour     Equipment Recommendations  Other (comment) (TBD at next venue of care)    Recommendations for Other Services       Precautions / Restrictions Precautions Precautions: Fall Precaution Comments: High Fall Required Braces or Orthoses: Sling Restrictions Weight Bearing Restrictions: Yes LUE Weight Bearing: Non weight bearing LUE Partial Weight Bearing Percentage or Pounds: May be WBAT through a PFRW LLE Weight Bearing: Partial weight bearing LLE Partial Weight Bearing Percentage or Pounds: 50% Other Position/Activity Restrictions: Per Dr. Mack Guise pt may WBAT throught the LUE with a platform walker, may have sling off with therapy.    Mobility  Bed Mobility Overal bed mobility: Needs Assistance Bed Mobility: Supine to Sit;Sit to Supine     Supine to sit: Max assist;+2 for physical assistance Sit to supine: +2 for physical assistance;Max assist   General bed mobility comments:  +2 Max assist for LEs and trunk control    Transfers Overall transfer level: Needs assistance Equipment used: Left platform walker Transfers: Sit to/from Stand Sit to Stand: +2 physical assistance;Max assist         General transfer comment: Pt performed sit to/from stand x 3 with +2 Max assist; pt unable to come to full upright position and required constant assist to prevent posterior LOB  Ambulation/Gait             General Gait Details: Unable   Stairs             Wheelchair Mobility    Modified Rankin (Stroke Patients Only)       Balance Overall balance assessment: Needs assistance Sitting-balance support: Feet supported;Single extremity supported Sitting balance-Leahy Scale: Fair Sitting balance - Comments: Improved static sitting balance with cues for decreased R lateral lean   Standing balance support: Bilateral upper extremity supported Standing balance-Leahy Scale: Poor Standing balance comment: Constant assist to prevent LOB                            Cognition Arousal/Alertness: Awake/alert Behavior During Therapy: WFL for tasks assessed/performed Overall Cognitive Status: No family/caregiver present to determine baseline cognitive functioning                                        Exercises Total Joint Exercises Ankle Circles/Pumps: AROM;Strengthening;Both;10 reps;5 reps Quad Sets: Strengthening;Both;10 reps;5 reps Short Arc  Quad: AROM;AAROM;Strengthening;Both;5 reps;10 reps Heel Slides: AAROM;Strengthening;Right;10 reps Hip ABduction/ADduction: AAROM;Both;10 reps;5 reps Straight Leg Raises: AAROM;Strengthening;Both;10 reps;5 reps Long Arc Quad: AROM;Strengthening;Both;10 reps;5 reps Knee Flexion: AROM;Strengthening;Both;5 reps;10 reps Other Exercises Other Exercises: Static unsupported sitting at EOB for core strengthening and improved activity tolerance    General Comments        Pertinent Vitals/Pain  Pain Assessment: Faces Faces Pain Scale: Hurts little more Pain Location: L hip, low back, and abdomen Pain Descriptors / Indicators: Aching;Sore;Grimacing;Moaning Pain Intervention(s): Repositioned;Premedicated before session;Monitored during session    Home Living                      Prior Function            PT Goals (current goals can now be found in the care plan section) Progress towards PT goals: Not progressing toward goals - comment (Limited by pain, cognition, and functional weakness)    Frequency    7X/week      PT Plan Current plan remains appropriate    Co-evaluation              AM-PAC PT "6 Clicks" Mobility   Outcome Measure  Help needed turning from your back to your side while in a flat bed without using bedrails?: Total Help needed moving from lying on your back to sitting on the side of a flat bed without using bedrails?: Total Help needed moving to and from a bed to a chair (including a wheelchair)?: Total Help needed standing up from a chair using your arms (e.g., wheelchair or bedside chair)?: Total Help needed to walk in hospital room?: Total Help needed climbing 3-5 steps with a railing? : Total 6 Click Score: 6    End of Session Equipment Utilized During Treatment: Gait belt Activity Tolerance: Patient limited by pain Patient left: in bed;with call bell/phone within reach;with bed alarm set;with SCD's reapplied Nurse Communication: Mobility status;Weight bearing status PT Visit Diagnosis: Unsteadiness on feet (R26.81);History of falling (Z91.81);Muscle weakness (generalized) (M62.81);Other abnormalities of gait and mobility (R26.89);Pain Pain - Right/Left: Left Pain - part of body: Hip     Time: 8127-5170 PT Time Calculation (min) (ACUTE ONLY): 42 min  Charges:  $Therapeutic Exercise: 23-37 mins $Therapeutic Activity: 8-22 mins                     D. Scott Marne Meline PT, DPT 10/15/20, 1:33 PM

## 2020-10-15 NOTE — TOC Progression Note (Signed)
Transition of Care [facility]) - Progression Note    Patient Details  Name: Jodi Reeves MRN: [id number] Date of Birth: July 09, 1927  Transition of Care [facility]) CM/SW [place], RN Phone Number: 10/15/2020, 10:58 AM  Clinical Narrative:     Spoke with the patient and reviewed the bed choices and the star rating of each with Medicare, she asked me to call her son Octavia Bruckner, I called Tim at [phone] and he requested that I check [facility] for additional beds. Sent bed search to [facility]       Expected Discharge Plan and Services                                                 Social Determinants of Health (SDOH) Interventions    Readmission Risk Interventions No flowsheet data found.

## 2020-10-15 NOTE — Progress Notes (Signed)
Subjective:  POD #3 s/p intramedullary fixation for left intertrochanteric hip fracture.  Patient is being treated nonoperatively for a left radial head fracture.   Patient reports left hip and elbow pain as mild.    Objective:   VITALS:   Vitals:   10/14/20 1620 10/14/20 1925 10/15/20 0513 10/15/20 0810  BP: (!) 103/58  138/89 133/77  Pulse: 90 87 95 94  Resp:   17 16  Temp: 98.9 F (37.2 C)  98.6 F (37 C) 98.2 F (36.8 C)  TempSrc:   Oral Oral  SpO2: 95% 97% 96% 97%  Weight:      Height:        PHYSICAL EXAM: Left lower extremity: Honeycomb dressing change last night has moderate serous drainage from the proximal incision. Neurovascular intact Sensation intact distally Intact pulses distally Dorsiflexion/Plantar flexion intact Incision: moderate drainage No cellulitis present Compartment soft  Left upper extremity: Skin abrasion over lateral left elbow is healing well.  There is no evidence of infection.  Patient has no significant pain with forearm rotation or elbow range motion.  Distally she is neurovascular intact LABS  Results for orders placed or performed during the hospital encounter of 10/10/20 (from the past 24 hour(s))  Glucose, capillary     Status: Abnormal   Collection Time: 10/14/20  4:21 PM  Result Value Ref Range   Glucose-Capillary 241 (H) 70 - 99 mg/dL  Glucose, capillary     Status: Abnormal   Collection Time: 10/14/20 10:06 PM  Result Value Ref Range   Glucose-Capillary 267 (H) 70 - 99 mg/dL   Comment 1 Notify RN   CBC     Status: Abnormal   Collection Time: 10/15/20  4:43 AM  Result Value Ref Range   WBC 9.3 4.0 - 10.5 K/uL   RBC 2.62 (L) 3.87 - 5.11 MIL/uL   Hemoglobin 8.9 (L) 12.0 - 15.0 g/dL   HCT 26.5 (L) 36.0 - 46.0 %   MCV 101.1 (H) 80.0 - 100.0 fL   MCH 34.0 26.0 - 34.0 pg   MCHC 33.6 30.0 - 36.0 g/dL   RDW 16.0 (H) 11.5 - 15.5 %   Platelets 167 150 - 400 K/uL   nRBC 0.4 (H) 0.0 - 0.2 %  Basic metabolic panel     Status:  Abnormal   Collection Time: 10/15/20  4:43 AM  Result Value Ref Range   Sodium 138 135 - 145 mmol/L   Potassium 4.2 3.5 - 5.1 mmol/L   Chloride 107 98 - 111 mmol/L   CO2 26 22 - 32 mmol/L   Glucose, Bld 132 (H) 70 - 99 mg/dL   BUN 25 (H) 8 - 23 mg/dL   Creatinine, Ser 0.54 0.44 - 1.00 mg/dL   Calcium 7.3 (L) 8.9 - 10.3 mg/dL   GFR, Estimated >60 >60 mL/min   Anion gap 5 5 - 15  Glucose, capillary     Status: Abnormal   Collection Time: 10/15/20  7:58 AM  Result Value Ref Range   Glucose-Capillary 165 (H) 70 - 99 mg/dL  Glucose, capillary     Status: Abnormal   Collection Time: 10/15/20 12:20 PM  Result Value Ref Range   Glucose-Capillary 234 (H) 70 - 99 mg/dL    No results found.  Assessment/Plan: 3 Days Post-Op   Active Problems:   Hypothyroidism   Essential hypertension   Permanent atrial fibrillation (HCC)   Chronic diastolic CHF (congestive heart failure) (HCC)   Type 2 diabetes mellitus with diabetic  polyneuropathy, without long-term current use of insulin (HCC)   Closed left hip fracture (HCC)   Acute blood loss anemia   Thrombocytopenia (HCC)   Closed intertrochanteric fracture of hip, left, initial encounter [facility])  Patient remains stable postop.  She is in no acute distress and follows commands on exam.  Hemoglobin 8.9 today continue physical therapy as patient can tolerate.  Patient is 50% weightbearing on the left lower extremity and weightbearing as tolerated through the left elbow with a platform walker.  Xarelto will be restarted once hemoglobin is stable.  Patient may be discharged to SNF from an orthopedic standpoint once cleared medically.  She will follow-up at [facility] 2 weeks following discharge for wound check and staple removal.      [place] , MD 10/15/2020, 1:25 PM

## 2020-10-15 NOTE — Progress Notes (Signed)
Occupational Therapy Treatment Patient Details Name: Jodi Reeves MRN: [id number] DOB: Feb 27, 1928 Today's Date: 10/15/2020    History of present illness Pt is a 87 y.o. female s/p fall with a diagnosis of non-surgical L radial head fx and L hip fx s/p IM nail. PMH includes balance disorder, HTN, AF, gait unsteadiness, DM2, PND, CAD, PAD, UTI, kyphoplasty.   OT comments  Ms. Conchas was seen for OT tx session this date. Pt received semi-reclined in bed. She endorses generalized discomfort in her low back, hip, neck, and head. She states she is having trouble laying her head back, but is unable to verbalize exactly what is bothering her. OT facilitates repositioning in bed with extensive time taken to place pillows to maximize joint protection and pt comfort. Pt educated on relaxation strategies and distraction techniques as complementary alternative method for pain management. Pt also educated on AE for LBD and weight bearing precautions. Pt education limited by cognitive status, but pt does demonstrate some recall of education provided during session. Will continue to require consistent education and skilled OT services to maximize return to PLOF and minimize risk of future falls, injury, caregiver burden, and readmission. Will continue to follow POC. Discharge recommendation remains appropriate.    Follow Up Recommendations  SNF;Supervision/Assistance - 24 hour    Equipment Recommendations  3 in 1 bedside commode    Recommendations for Other Services      Precautions / Restrictions Precautions Precautions: Fall Precaution Comments: High Fall Required Braces or Orthoses: Sling Restrictions Weight Bearing Restrictions: Yes LUE Weight Bearing: Non weight bearing LUE Partial Weight Bearing Percentage or Pounds: May be WBAT through [place] LLE Weight Bearing: Partial weight bearing LLE Partial Weight Bearing Percentage or Pounds: 50% Other Position/Activity Restrictions: Per Dr. Mack Guise  pt may WBAT throught the LUE with a platform walker, may have sling off with therapy.       Mobility Bed Mobility Overal bed mobility: Needs Assistance Bed Mobility: Supine to Sit;Sit to Supine     Supine to sit: Max assist;+2 for physical assistance Sit to supine: +2 for physical assistance;Max assist   General bed mobility comments: Deferred for pt safety/comfort.    Transfers Overall transfer level: Needs assistance Equipment used: Left platform walker Transfers: Sit to/from Stand Sit to Stand: +2 physical assistance;Max assist         General transfer comment: Pt performed sit to/from stand x 3 with +2 Max assist; pt unable to come to full upright position and required constant assist to prevent posterior LOB    Balance Overall balance assessment: Needs assistance Sitting-balance support: Feet supported;Single extremity supported Sitting balance-Leahy Scale: Fair Sitting balance - Comments: Improved static sitting balance with cues for decreased R lateral lean   Standing balance support: Bilateral upper extremity supported Standing balance-Leahy Scale: Poor Standing balance comment: Constant assist to prevent LOB                           ADL either performed or assessed with clinical judgement   ADL Overall ADL's : Needs assistance/impaired                                       General ADL Comments: Pt continues to be significantly functionally limited 2/2 impaired cognition, increased pain in her lower back, LLE/LUE, decreased functional use of her LUE, and decreased awareness of deficits. Pt  requires MOD/MAX A to long-sit in bed. She has difficulty following VCs during session and was unsafe/unable to attempt functional mobility without +2 assist. Educated on AE for LB ADL management.     Vision Baseline Vision/History: Wears glasses Wears Glasses: At all times Patient Visual Report: No change from baseline     Perception      Praxis      Cognition Arousal/Alertness: Awake/alert Behavior During Therapy: WFL for tasks assessed/performed Overall Cognitive Status: No family/caregiver present to determine baseline cognitive functioning                                 General Comments: Generalized confusion. Decreased short term membory. Decreased recall of precautions. Difficulty with word finding.        Exercises Total Joint Exercises Ankle Circles/Pumps: AROM;Strengthening;Both;10 reps;5 reps Quad Sets: Strengthening;Both;10 reps;5 reps Short Arc Quad: AROM;AAROM;Strengthening;Both;5 reps;10 reps Heel Slides: AAROM;Strengthening;Right;10 reps Hip ABduction/ADduction: AAROM;Both;10 reps;5 reps Straight Leg Raises: AAROM;Strengthening;Both;10 reps;5 reps Long Arc Quad: AROM;Strengthening;Both;10 reps;5 reps Knee Flexion: AROM;Strengthening;Both;5 reps;10 reps Other Exercises Other Exercises: OT facilitates pt education on safe use of AE/DME for ADL management, weight bearing precautions, and considerations for ADL management with limited LUE/LLE functional use. Extensive time taken to assist pt with positioning in bed as she endorses feeling like she "can't put my head back".   Shoulder Instructions       General Comments      Pertinent Vitals/ Pain       Pain Assessment: Faces Faces Pain Scale: Hurts even more Pain Location: Low back, hip, head Pain Descriptors / Indicators: Aching;Sore;Grimacing;Moaning Pain Intervention(s): Limited activity within patient's tolerance;Monitored during session;Repositioned;Utilized relaxation techniques  Home Living                                          Prior Functioning/Environment              Frequency  Min 2X/week        Progress Toward Goals  OT Goals(current goals can now be found in the care plan section)  Progress towards OT goals: Progressing toward goals  Acute Rehab OT Goals Patient Stated Goal: To go  to rehab. OT Goal Formulation: With patient Time For Goal Achievement: 10/27/20 Potential to Achieve Goals: Good  Plan Discharge plan remains appropriate;Frequency remains appropriate    Co-evaluation                 AM-PAC OT "6 Clicks" Daily Activity     Outcome Measure   Help from another person eating meals?: A Little Help from another person taking care of personal grooming?: A Little Help from another person toileting, which includes using toliet, bedpan, or urinal?: A Lot Help from another person bathing (including washing, rinsing, drying)?: A Lot Help from another person to put on and taking off regular upper body clothing?: A Lot Help from another person to put on and taking off regular lower body clothing?: A Lot 6 Click Score: 14    End of Session    OT Visit Diagnosis: Other abnormalities of gait and mobility (R26.89);Pain;History of falling (Z91.81) Pain - Right/Left: Left Pain - part of body: Arm;Hip;Knee   Activity Tolerance No increased pain   Patient Left in bed;with call bell/phone within reach;with bed alarm set   Nurse Communication  Time: 9357-0177 OT Time Calculation (min): 39 min  Charges: OT General Charges $OT Visit: 1 Visit OT Treatments $Self Care/Home Management : 38-52 mins  Shara Blazing, M.S., OTR/L Ascom: [phone] 10/15/20, 4:01 PM

## 2020-10-15 NOTE — TOC Progression Note (Signed)
Transition of Care [facility]) - Progression Note    Patient Details  Name: Jodi Reeves MRN: [id number] Date of Birth: 1927/11/06  Transition of Care [facility]) CM/SW [place], RN Phone Number: 10/15/2020, 6:26 PM  Clinical Narrative:     The patienbt's son Octavia Bruckner called back and stated that they chose [facility], he asked if after they go there can it be changed, I explained that every SNF has a SW and if they were unhappy with the facility they could ask the SW to help them locate another facility, I contacted [place] at [facility] that they chose the facility and requested that she start insurance approval, she stated that she is in the process of startiing it       Expected Discharge Plan and Services                                                 Social Determinants of Health (SDOH) Interventions    Readmission Risk Interventions No flowsheet data found.

## 2020-10-15 NOTE — TOC Progression Note (Signed)
Transition of Care [facility]) - Progression Note    Patient Details  Name: Jodi Reeves MRN: [id number] Date of Birth: 09/21/1927  Transition of Care [facility]) CM/SW [place], RN Phone Number: 10/15/2020, 1:11 PM  Clinical Narrative:     [facility] in [facility] is full and has a waiting list, unable to make offer       Expected Discharge Plan and Services                                                 Social Determinants of Health (SDOH) Interventions    Readmission Risk Interventions No flowsheet data found.

## 2020-10-15 NOTE — TOC Progression Note (Signed)
Transition of Care [facility]) - Progression Note    Patient Details  Name: Jodi Reeves MRN: [id number] Date of Birth: 1928-01-23  Transition of Care [facility]) CM/SW [place], RN Phone Number: 10/15/2020, 3:17 PM  Clinical Narrative:      I called the patients son again and explained that we only have 3 bed offers reviewed each one with the star ratings, I explained [facility] does not have a bed.  He stated that he is not please with the bed offers and would like more, I explained that I have requested a bed from [place] area as well as [place], I explained that they do have bed offers and because the patient is medically stable the only option is to choose one of the bed offers listed or the patient may end up having to pay for the hospital stay from the point of being medically ready until discharge, he stated that he would call his daughter and the patient and call me back      Expected Discharge Plan and Services                                                 Social Determinants of Health (SDOH) Interventions    Readmission Risk Interventions No flowsheet data found.

## 2020-10-15 NOTE — Progress Notes (Signed)
PROGRESS NOTE    Jodi Reeves  GOT:[id number] DOB: 06-01-1927 DOA: 10/10/2020 PCP: [name], Nino Glow, MD   Chief complaint.  Hip pain. Brief Narrative:  Jodi Reeves is a 87 y.o. female with medical history significant for multiple medical problems that are mentioned below, including atrial fibrillation with anticoagulation with Xarelto, who presented to the emergency room with acute onset of left hip pain.  The patient was turning off the light switch when she stepped back and lost her balance and fell on the floor.  She sustained left hip fracture.  She has been seen by Dr. Mack Guise, planning for hip surgery 6/21.   Assessment & Plan:   Active Problems:   Hypothyroidism   Essential hypertension   Permanent atrial fibrillation (HCC)   Chronic diastolic CHF (congestive heart failure) (HCC)   Type 2 diabetes mellitus with diabetic polyneuropathy, without long-term current use of insulin (HCC)   Closed left hip fracture (HCC)   Acute blood loss anemia   Thrombocytopenia (HCC)   Closed intertrochanteric fracture of hip, left, initial encounter ([place])  Left hip fracture. S/p operative repair 6/21.  - pt/ot consulted, plan for SNF, SW aware - morphine/hydrocodone/tramadol for pain control, miralax/senna/docusate  Delirium Mild. - delirium precautions  Acute blood loss anemia 2/2 hip fracture. Has received 2 units prbcs, last unit was 6/23. Today hgb improved from 7 to 8.9. no melena or hematochezia.  - repeat cbc tomorrow, if hgb stable will plan to re-start home rivaroxaban  Chronic diastolic congestive heart failure. Permanent atrial fibrillation. Patient had episode of rapid ventricular response that is improved. Cardiology following - holding rivaroxaban as above - cont home bisoprolol and lasix - follow cardiology recs  Oxygen requirement Now weaned off O2 - monitor  Macrocytosis 12/folate wnl  Type 2 diabetes. Continue sliding scale insulin as well  as home tradjenta. Pt refuses metformin. Have lantus 5. Fasting glucose wnl today    DVT prophylaxis: SCDs Code Status: full Family Communication: updated son telephonically 6/23 Disposition Plan: SNF, bed search taking place  Status is: Inpatient  Remains inpatient appropriate because:Inpatient level of care appropriate due to severity of illness  Dispo: The patient is from: Home              Anticipated d/c is to: SNF              Patient currently is not medically stable to d/c.   Difficult to place patient No     I/O last 3 completed shifts: In: 800 [Blood:800] Out: 750 [Urine:750] No intake/output data recorded.     Consultants:  Orthopedics  Procedures: Hip today  Antimicrobials: None  Subjective: Complains of pain with movement. Breathing stable  Objective: Vitals:   10/14/20 1620 10/14/20 1925 10/15/20 0513 10/15/20 0810  BP: (!) 103/58  138/89 133/77  Pulse: 90 87 95 94  Resp:   17 16  Temp: 98.9 F (37.2 C)  98.6 F (37 C) 98.2 F (36.8 C)  TempSrc:   Oral Oral  SpO2: 95% 97% 96% 97%  Weight:      Height:        Intake/Output Summary (Last 24 hours) at 10/15/2020 1104 Last data filed at 10/15/2020 1044 Gross per 24 hour  Intake 800 ml  Output 550 ml  Net 250 ml   Filed Weights   10/10/20 0230 10/12/20 1429  Weight: 62.2 kg 62.2 kg    Examination:  General exam: Appears calm and comfortable  Respiratory system: Clear to  auscultation. Respiratory effort normal. Cardiovascular system: Irregularly irregular, mildly tachycardic.  No JVD, murmurs, rubs, gallops or clicks.  Gastrointestinal system: Abdomen is nondistended, soft and nontender. No organomegaly or masses felt. Normal bowel sounds heard. Central nervous system: Alert and oriented x3. No focal neurological deficits. Extremities: trace LE edema. : hip bandaged Skin: No rashes, lesions or ulcers Psychiatry: Judgement and insight appear normal. Mood & affect appropriate.     Data  Reviewed: I have personally reviewed following labs and imaging studies  CBC: Recent Labs  Lab 10/08/20 1848 10/10/20 0321 10/11/20 0454 10/12/20 4010 10/13/20 0521 10/14/20 0440 10/15/20 0443  WBC 5.6 9.4 9.0 13.0* 9.7 10.1 9.3  NEUTROABS 3.5 8.1*  --  10.3* 8.2*  --   --   HGB 11.9* 10.8* 7.8* 9.8* 8.6* 7.0* 8.9*  HCT 36.6 33.1* 24.0* 29.0* 26.2* 21.3* 26.5*  MCV 103.4* 104.4* 105.3* 101.0* 104.0* 101.9* 101.1*  PLT 196 170 122* 137* 145* 163 272   Basic Metabolic Panel: Recent Labs  Lab 10/11/20 0454 10/12/20 0613 10/13/20 0521 10/13/20 1917 10/14/20 0440 10/15/20 0443  NA 137 137 138 134* 137 138  K 4.4 3.7 4.0 4.1 4.4 4.2  CL 108 104 107 103 106 107  CO2 24 25 24 24 26 26   GLUCOSE 165* 202* 186* 314* 176* 132*  BUN 15 15 23  34* 33* 25*  CREATININE 0.56 0.61 0.71 0.89 0.65 0.54  CALCIUM 7.6* 7.8* 7.3* 7.5* 7.4* 7.3*  MG 2.0 2.0 2.1  --   --   --    GFR: Estimated Creatinine Clearance: 37.9 mL/min (by C-G formula based on SCr of 0.54 mg/dL). Liver Function Tests: Recent Labs  Lab 10/08/20 1848  AST 21  ALT 10  ALKPHOS 62  BILITOT 0.6  PROT 7.6  ALBUMIN 3.9   No results for input(s): LIPASE, AMYLASE in the last 168 hours. No results for input(s): AMMONIA in the last 168 hours. Coagulation Profile: Recent Labs  Lab 10/10/20 0321  INR 2.7*   Cardiac Enzymes: No results for input(s): CKTOTAL, CKMB, CKMBINDEX, TROPONINI in the last 168 hours. BNP (last 3 results) No results for input(s): PROBNP in the last 8760 hours. HbA1C: No results for input(s): HGBA1C in the last 72 hours.  CBG: Recent Labs  Lab 10/14/20 0841 10/14/20 1207 10/14/20 1621 10/14/20 2206 10/15/20 0758  GLUCAP 195* 276* 241* 267* 165*   Lipid Profile: No results for input(s): CHOL, HDL, LDLCALC, TRIG, CHOLHDL, LDLDIRECT in the last 72 hours. Thyroid Function Tests: No results for input(s): TSH, T4TOTAL, FREET4, T3FREE, THYROIDAB in the last 72 hours. Anemia Panel: Recent  Labs    10/13/20 0521 10/13/20 1415  VITAMINB12  --  4,418*  FOLATE 33.0  --    Sepsis Labs: No results for input(s): PROCALCITON, LATICACIDVEN in the last 168 hours.  Recent Results (from the past 240 hour(s))  Resp Panel by RT-PCR (Flu A&B, Covid) Nasopharyngeal Swab     Status: None   Collection Time: 10/10/20  3:21 AM   Specimen: Nasopharyngeal Swab; Nasopharyngeal(NP) swabs in vial transport medium  Result Value Ref Range Status   SARS Coronavirus 2 by RT PCR NEGATIVE NEGATIVE Final    Comment: (NOTE) SARS-CoV-2 target nucleic acids are NOT DETECTED.  The SARS-CoV-2 RNA is generally detectable in upper respiratory specimens during the acute phase of infection. The lowest concentration of SARS-CoV-2 viral copies this assay can detect is 138 copies/mL. A negative result does not preclude SARS-Cov-2 infection and should not be used as  the sole basis for treatment or other patient management decisions. A negative result may occur with  improper specimen collection/handling, submission of specimen other than nasopharyngeal swab, presence of viral mutation(s) within the areas targeted by this assay, and inadequate number of viral copies(<138 copies/mL). A negative result must be combined with clinical observations, patient history, and epidemiological information. The expected result is Negative.  Fact Sheet for Patients:  EntrepreneurPulse.com.au  Fact Sheet for Healthcare Providers:  IncredibleEmployment.be  This test is no t yet approved or cleared by the [place] FDA and  has been authorized for detection and/or diagnosis of SARS-CoV-2 by FDA under an Emergency Use Authorization (EUA). This EUA will remain  in effect (meaning this test can be used) for the duration of the COVID-19 declaration under Section 564(b)(1) of the Act, 21 U.S.C.section 360bbb-3(b)(1), unless the authorization is terminated  or revoked sooner.        Influenza A by PCR NEGATIVE NEGATIVE Final   Influenza B by PCR NEGATIVE NEGATIVE Final    Comment: (NOTE) The Xpert Xpress SARS-CoV-2/FLU/RSV plus assay is intended as an aid in the diagnosis of influenza from Nasopharyngeal swab specimens and should not be used as a sole basis for treatment. Nasal washings and aspirates are unacceptable for Xpert Xpress SARS-CoV-2/FLU/RSV testing.  Fact Sheet for Patients: EntrepreneurPulse.com.au  Fact Sheet for Healthcare Providers: IncredibleEmployment.be  This test is not yet approved or cleared by the [place] FDA and has been authorized for detection and/or diagnosis of SARS-CoV-2 by FDA under an Emergency Use Authorization (EUA). This EUA will remain in effect (meaning this test can be used) for the duration of the COVID-19 declaration under Section 564(b)(1) of the Act, 21 U.S.C. section 360bbb-3(b)(1), unless the authorization is terminated or revoked.  Performed at [facility], [address], [place], [place] [address]   Surgical PCR screen     Status: None   Collection Time: 10/10/20 11:20 PM   Specimen: Nasal Mucosa; Nasal Swab  Result Value Ref Range Status   MRSA, PCR NEGATIVE NEGATIVE Final   Staphylococcus aureus NEGATIVE NEGATIVE Final    Comment: (NOTE) The Xpert SA Assay (FDA approved for NASAL specimens in patients 56 years of age and older), is one component of a comprehensive surveillance program. It is not intended to diagnose infection nor to guide or monitor treatment. Performed at [facility], [address] [place], [place] [address]          Radiology Studies: No results found.      Scheduled Meds:  acidophilus  1 capsule Oral Daily   ascorbic acid  2,000 mg Oral Daily   bisoprolol  5 mg Oral BID   Chlorhexidine Gluconate Cloth  6 each Topical Daily   docusate sodium  100 mg Oral BID   vitamin B-12  1,000 mcg Oral Daily   And    folic acid  1 mg Oral Daily   And   vitamin B-6  50 mg Oral Daily   furosemide  20 mg Oral BID   insulin aspart  0-15 Units Subcutaneous TID AC & HS   insulin glargine  5 Units Subcutaneous Daily   levothyroxine  50 mcg Oral Q0600   linagliptin  5 mg Oral Daily   magnesium oxide  200 mg Oral Daily   metFORMIN  500 mg Oral BID AC   multivitamin with minerals   Oral Daily   omega-3 acid ethyl esters  1,000 mg Oral BID   polyethylene glycol  17 g Oral Daily   potassium chloride  10 mEq Oral BID   senna  1 tablet Oral BID   traMADol  50 mg Oral Q6H   Continuous Infusions:   LOS: 5 days    Time spent: 25 minutes    Desma Maxim, MD [facility]   To contact the attending provider between 7A-7P or the covering provider during after hours 7P-7A, please log into the web site [email or web address] and access using universal [place] password for that web site. If you do not have the password, please call the hospital operator.  10/15/2020, 11:04 AM

## 2020-10-16 LAB — CBC
HCT: 26.8 % — ABNORMAL LOW (ref 36.0–46.0)
Hemoglobin: 8.8 g/dL — ABNORMAL LOW (ref 12.0–15.0)
MCH: 33.1 pg (ref 26.0–34.0)
MCHC: 32.8 g/dL (ref 30.0–36.0)
MCV: 100.8 fL — ABNORMAL HIGH (ref 80.0–100.0)
Platelets: 177 10*3/uL (ref 150–400)
RBC: 2.66 MIL/uL — ABNORMAL LOW (ref 3.87–5.11)
RDW: 15.9 % — ABNORMAL HIGH (ref 11.5–15.5)
WBC: 7.2 10*3/uL (ref 4.0–10.5)
nRBC: 0.4 % — ABNORMAL HIGH (ref 0.0–0.2)

## 2020-10-16 LAB — GLUCOSE, CAPILLARY
Glucose-Capillary: 195 mg/dL — ABNORMAL HIGH (ref 70–99)
Glucose-Capillary: 248 mg/dL — ABNORMAL HIGH (ref 70–99)
Glucose-Capillary: 220 mg/dL — ABNORMAL HIGH (ref 70–99)
Glucose-Capillary: 96 mg/dL (ref 70–99)

## 2020-10-16 LAB — BASIC METABOLIC PANEL
Anion gap: 3 — ABNORMAL LOW (ref 5–15)
BUN: 28 mg/dL — ABNORMAL HIGH (ref 8–23)
CO2: 27 mmol/L (ref 22–32)
Calcium: 7.8 mg/dL — ABNORMAL LOW (ref 8.9–10.3)
Chloride: 108 mmol/L (ref 98–111)
Creatinine, Ser: 0.5 mg/dL (ref 0.44–1.00)
GFR, Estimated: >60 mL/min (ref >60)
Glucose, Bld: 170 mg/dL — ABNORMAL HIGH (ref 70–99)
Potassium: 4.7 mmol/L (ref 3.5–5.1)
Sodium: 138 mmol/L (ref 135–145)

## 2020-10-16 MED ORDER — RIVAROXABAN 15 MG PO TABS
15.0000 mg | ORAL_TABLET | Freq: Every day | ORAL | Status: DC
  Administered 2020-10-16: 15 mg via ORAL
  Filled 2020-10-16 (×2): qty 1

## 2020-10-16 MED ORDER — INSULIN GLARGINE 100 UNIT/ML ~~LOC~~ SOLN
10.0000 [IU] | Freq: Every day | SUBCUTANEOUS | Status: DC
  Administered 2020-10-17 – 2020-10-20 (×4): 10 [IU] via SUBCUTANEOUS
  Filled 2020-10-16 (×5): qty 0.1

## 2020-10-16 NOTE — Progress Notes (Signed)
Subjective: 4 Days Post-Op Procedure(s) (LRB): INTRAMEDULLARY (IM) NAIL INTERTROCHANTRIC (Left)   The patient is alert and comfortable in bed.  There is fair amount of serosanguineous drainage from her wound.  This will be redressed.  Hemoglobin is up to 8.8.  Hip motion is satisfactory with mild pain. Patient reports pain as mild.  Objective:   VITALS:   Vitals:   10/16/20 0409 10/16/20 0824  BP: 116/62 123/74  Pulse: 86 87  Resp: 18 16  Temp: 98.5 F (36.9 C) 98.2 F (36.8 C)  SpO2: 96% 96%    Neurologically intact Incision: moderate drainage  LABS Recent Labs    10/14/20 0440 10/15/20 0443 10/16/20 0433  HGB 7.0* 8.9* 8.8*  HCT 21.3* 26.5* 26.8*  WBC 10.1 9.3 7.2  PLT 163 167 177    Recent Labs    10/14/20 0440 10/15/20 0443 10/16/20 0433  NA 137 138 138  K 4.4 4.2 4.7  BUN 33* 25* 28*  CREATININE 0.65 0.54 0.50  GLUCOSE 176* 132* 170*    No results for input(s): LABPT, INR in the last 72 hours.   Assessment/Plan: 4 Days Post-Op Procedure(s) (LRB): INTRAMEDULLARY (IM) NAIL INTERTROCHANTRIC (Left)   Advance diet Up with therapy Discharge to SNF

## 2020-10-16 NOTE — Progress Notes (Signed)
[place] for Rivaroxaban Indication: Nonvalvular afib  Allergies  Allergen Reactions   Amiodarone Other (See Comments)    Severe Thyroid issues    Fosamax [Alendronate]     dysphagia   Penicillins Anaphylaxis and Other (See Comments)    TOLERATED CEFAZOLIN 07/06/20 Has patient had a PCN reaction causing immediate rash, facial/tongue/throat swelling, SOB or lightheadedness with hypotension: Yes Has patient had a PCN reaction causing severe rash involving mucus membranes or skin necrosis: No Has patient had a PCN reaction that required hospitalization: No Has patient had a PCN reaction occurring within the last 10 years: No If all of the above answers are "NO", then may proceed with Cephalosporin use.    Sulfa Antibiotics Itching and Rash    Itching    Actos [Pioglitazone]     Not effective    Amaryl [Glimepiride]     Not effective     Atorvastatin Other (See Comments)    Muscle aches & All statins per Patient   Bentyl [Dicyclomine Hcl]     Could not tolerate nausea, reduced concentration, h/a    Ciprofloxacin Other (See Comments)    High blood pressure     Codeine Nausea And Vomiting   Ezetimibe-Simvastatin Other (See Comments)    Muscle aches, nausea, back pain    Lovastatin Other (See Comments)    Myalgias   Macrobid [Nitrofurantoin Macrocrystal]     Severe Itching, rash    Protonix [Pantoprazole Sodium]     Esophageal problems  Wants removed from list   Rosiglitazone Maleate Other (See Comments)    Edema   Statins     Muscle and joint aches to all statins   Victoza [Liraglutide]     Nausea    Alendronate Sodium Rash    Dysphagia and ulceration    Bactrim [Sulfamethoxazole-Trimethoprim] Rash    itching    Patient Measurements: Height: 5\' 3"  (160 cm) Weight: 62.2 kg (137 lb 2 oz) IBW/kg (Calculated) : 52.4  Vital Signs: Temp: 98.2 F (36.8 C) (06/25 0824) Temp Source: Oral (06/25 0409) BP: 123/74 (06/25  0824) Pulse Rate: 87 (06/25 0824)  Labs: Recent Labs    10/14/20 0440 10/15/20 0443 10/16/20 0433  HGB 7.0* 8.9* 8.8*  HCT 21.3* 26.5* 26.8*  PLT 163 167 177  CREATININE 0.65 0.54 0.50    Estimated Creatinine Clearance: 37.9 mL/min (by C-G formula based on SCr of 0.5 mg/dL).   Medical History: Past Medical History:  Diagnosis Date   Allergy    Anxiety    Balance disorder    Chronic diastolic CHF (congestive heart failure) ([place])    a. 10/2018 Echo: EF 55-60%, diast dysfxn, nl RV fxn. RVSP 45.55mmHg. Mild-mod TR. Mod dil LA.   Colon polyps    Coronary artery disease, non-occlusive    a. LHC 11/2003: 10% LAD stenosis, 20% LCx stenosis, and 40% RCA stenosis; b. nuc stress test 12/15: small region of mild ischemia in the apical region with WMA also noted in the apical region, EF 60%. 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 CHADS2VASc = > 8 (CHF, HTN, age x 2, DM, TIA x 2, female); b. on Xarelto   SBO (small bowel obstruction) ([place])    1998   TIA (transient ischemic attack)    UTI (urinary tract infection)    Venous insufficiency    Ventral hernia     Assessment: 87 yo female with medical history significant for hypothyroidism, HTN, afib, CHF, DM who presented to the emergency room with left hip pain. Pt fell and sustained left hip fracture and was seen by Dr. Mack Guise and had  hip surgery 6/21. Pt was on rivaroxaban 20 mg daily prior to admission which was help in anticipation for her hip surgery. Pharmacy has been consulted for rivaroxaban.   Pt CrCl using actual body weight (62.2 kg) still <=50 mL/min  Goal of Therapy:  Monitor platelets by anticoagulation protocol: Yes   Plan:  Based on pt CrCl, will start Rivaroxaban 15 mg daily   Sherilyn Banker, PharmD 10/16/2020,11:37 AM

## 2020-10-16 NOTE — Progress Notes (Signed)
PROGRESS NOTE    Jodi Reeves  HQI:[id number] DOB: [birth year] DOA: 10/10/2020 PCP: [name], Nino Glow, MD   Chief complaint.  Hip pain. Brief Narrative:  Jodi Reeves is a 87 y.o. female with medical history significant for multiple medical problems that are mentioned below, including atrial fibrillation with anticoagulation with Xarelto, who presented to the emergency room with acute onset of left hip pain.  The patient was turning off the light switch when she stepped back and lost her balance and fell on the floor.  She sustained left hip fracture.  She has been seen by Dr. Mack Guise, planning for hip surgery 6/21.   Assessment & Plan:   Active Problems:   Hypothyroidism   Essential hypertension   Permanent atrial fibrillation (HCC)   Chronic diastolic CHF (congestive heart failure) (HCC)   Type 2 diabetes mellitus with diabetic polyneuropathy, without long-term current use of insulin (HCC)   Closed left hip fracture (HCC)   Acute blood loss anemia   Thrombocytopenia (HCC)   Closed intertrochanteric fracture of hip, left, initial encounter ([place])  Left hip fracture. S/p operative repair 6/21.  - pt/ot consulted, plan for SNF, SW aware - morphine/hydrocodone/tramadol for pain control, miralax/senna/docusate - serous drainage today, ortho aware  Delirium Mild, improving - delirium precautions  Acute blood loss anemia 2/2 hip fracture. Has received 2 units prbcs, last unit was 6/23. Hgb stable in 8s since then - monitor  Chronic diastolic congestive heart failure. Permanent atrial fibrillation. Patient had episode of rapid ventricular response that is improved. Cardiology following - re-start rivaroxaban as hgb stable - cont home bisoprolol and lasix - follow cardiology recs  Oxygen requirement Now weaned off O2 - monitor  Macrocytosis 12/folate wnl  Type 2 diabetes. Continue sliding scale insulin as well as home tradjenta. Pt refuses metformin.  Fastings elevated, will increase lantus from 5 to 10    DVT prophylaxis: rivaroxaban Code Status: full Family Communication: updated son telephonically 6/23 Disposition Plan: SNF, bed search taking place  Status is: Inpatient  Remains inpatient appropriate because:Inpatient level of care appropriate due to severity of illness  Dispo: The patient is from: Home              Anticipated d/c is to: SNF              Patient currently is not medically stable to d/c.   Difficult to place patient No     I/O last 3 completed shifts: In: 240 [P.O.:240] Out: 950 [Urine:950] Total I/O In: 120 [P.O.:120] Out: -      Consultants:  Orthopedics, cardiology  Procedures: Hip today  Antimicrobials: None  Subjective: Complains of pain with movement. Breathing stable. Tolerating diet. Pain improved some.  Objective: Vitals:   10/15/20 1637 10/15/20 1957 10/16/20 0409 10/16/20 0824  BP: (!) 105/53 109/74 116/62 123/74  Pulse: 81 80 86 87  Resp: 18 18 18 16   Temp: 98.5 F (36.9 C) 99 F (37.2 C) 98.5 F (36.9 C) 98.2 F (36.8 C)  TempSrc: Oral Oral Oral   SpO2: 98% 97% 96% 96%  Weight:      Height:        Intake/Output Summary (Last 24 hours) at 10/16/2020 1111 Last data filed at 10/16/2020 1018 Gross per 24 hour  Intake 360 ml  Output 400 ml  Net -40 ml   Filed Weights   10/10/20 0230 10/12/20 1429  Weight: 62.2 kg 62.2 kg    Examination:  General exam: Appears calm and  comfortable  Respiratory system: Clear to auscultation. Respiratory effort normal. Cardiovascular system: Irregularly irregular, mildly tachycardic.  No JVD, murmurs, rubs, gallops or clicks.  Gastrointestinal system: Abdomen is nondistended, soft and nontender. No organomegaly or masses felt. Normal bowel sounds heard. Central nervous system: Alert and oriented x3. No focal neurological deficits. Extremities: trace LE edema. : hip bandaged Skin: bandage left lateral hip serous drainage Psychiatry:  Judgement and insight appear normal. Mood & affect appropriate.     Data Reviewed: I have personally reviewed following labs and imaging studies  CBC: Recent Labs  Lab 10/10/20 0321 10/11/20 0454 10/12/20 0613 10/13/20 0521 10/14/20 0440 10/15/20 0443 10/16/20 0433  WBC 9.4   < > 13.0* 9.7 10.1 9.3 7.2  NEUTROABS 8.1*  --  10.3* 8.2*  --   --   --   HGB 10.8*   < > 9.8* 8.6* 7.0* 8.9* 8.8*  HCT 33.1*   < > 29.0* 26.2* 21.3* 26.5* 26.8*  MCV 104.4*   < > 101.0* 104.0* 101.9* 101.1* 100.8*  PLT 170   < > 137* 145* 163 167 177   < > = values in this interval not displayed.   Basic Metabolic Panel: Recent Labs  Lab 10/11/20 0454 10/12/20 9323 10/13/20 0521 10/13/20 1917 10/14/20 0440 10/15/20 0443 10/16/20 0433  NA 137 137 138 134* 137 138 138  K 4.4 3.7 4.0 4.1 4.4 4.2 4.7  CL 108 104 107 103 106 107 108  CO2 24 25 24 24 26 26 27   GLUCOSE 165* 202* 186* 314* 176* 132* 170*  BUN 15 15 23  34* 33* 25* 28*  CREATININE 0.56 0.61 0.71 0.89 0.65 0.54 0.50  CALCIUM 7.6* 7.8* 7.3* 7.5* 7.4* 7.3* 7.8*  MG 2.0 2.0 2.1  --   --   --   --    GFR: Estimated Creatinine Clearance: 37.9 mL/min (by C-G formula based on SCr of 0.5 mg/dL). Liver Function Tests: No results for input(s): AST, ALT, ALKPHOS, BILITOT, PROT, ALBUMIN in the last 168 hours.  No results for input(s): LIPASE, AMYLASE in the last 168 hours. No results for input(s): AMMONIA in the last 168 hours. Coagulation Profile: Recent Labs  Lab 10/10/20 0321  INR 2.7*   Cardiac Enzymes: No results for input(s): CKTOTAL, CKMB, CKMBINDEX, TROPONINI in the last 168 hours. BNP (last 3 results) No results for input(s): PROBNP in the last 8760 hours. HbA1C: No results for input(s): HGBA1C in the last 72 hours.  CBG: Recent Labs  Lab 10/15/20 1220 10/15/20 1631 10/15/20 1707 10/15/20 2055 10/16/20 0745  GLUCAP 234* 212* 197* 282* 195*   Lipid Profile: No results for input(s): CHOL, HDL, LDLCALC, TRIG, CHOLHDL,  LDLDIRECT in the last 72 hours. Thyroid Function Tests: No results for input(s): TSH, T4TOTAL, FREET4, T3FREE, THYROIDAB in the last 72 hours. Anemia Panel: Recent Labs    10/13/20 1415  VITAMINB12 4,418*   Sepsis Labs: No results for input(s): PROCALCITON, LATICACIDVEN in the last 168 hours.  Recent Results (from the past 240 hour(s))  Resp Panel by RT-PCR (Flu A&B, Covid) Nasopharyngeal Swab     Status: None   Collection Time: 10/10/20  3:21 AM   Specimen: Nasopharyngeal Swab; Nasopharyngeal(NP) swabs in vial transport medium  Result Value Ref Range Status   SARS Coronavirus 2 by RT PCR NEGATIVE NEGATIVE Final    Comment: (NOTE) SARS-CoV-2 target nucleic acids are NOT DETECTED.  The SARS-CoV-2 RNA is generally detectable in upper respiratory specimens during the acute phase of infection. The  lowest concentration of SARS-CoV-2 viral copies this assay can detect is 138 copies/mL. A negative result does not preclude SARS-Cov-2 infection and should not be used as the sole basis for treatment or other patient management decisions. A negative result may occur with  improper specimen collection/handling, submission of specimen other than nasopharyngeal swab, presence of viral mutation(s) within the areas targeted by this assay, and inadequate number of viral copies(<138 copies/mL). A negative result must be combined with clinical observations, patient history, and epidemiological information. The expected result is Negative.  Fact Sheet for Patients:  EntrepreneurPulse.com.au  Fact Sheet for Healthcare Providers:  IncredibleEmployment.be  This test is no t yet approved or cleared by the [place] FDA and  has been authorized for detection and/or diagnosis of SARS-CoV-2 by FDA under an Emergency Use Authorization (EUA). This EUA will remain  in effect (meaning this test can be used) for the duration of the COVID-19 declaration under Section  564(b)(1) of the Act, 21 U.S.C.section 360bbb-3(b)(1), unless the authorization is terminated  or revoked sooner.       Influenza A by PCR NEGATIVE NEGATIVE Final   Influenza B by PCR NEGATIVE NEGATIVE Final    Comment: (NOTE) The Xpert Xpress SARS-CoV-2/FLU/RSV plus assay is intended as an aid in the diagnosis of influenza from Nasopharyngeal swab specimens and should not be used as a sole basis for treatment. Nasal washings and aspirates are unacceptable for Xpert Xpress SARS-CoV-2/FLU/RSV testing.  Fact Sheet for Patients: EntrepreneurPulse.com.au  Fact Sheet for Healthcare Providers: IncredibleEmployment.be  This test is not yet approved or cleared by the [place] FDA and has been authorized for detection and/or diagnosis of SARS-CoV-2 by FDA under an Emergency Use Authorization (EUA). This EUA will remain in effect (meaning this test can be used) for the duration of the COVID-19 declaration under Section 564(b)(1) of the Act, 21 U.S.C. section 360bbb-3(b)(1), unless the authorization is terminated or revoked.  Performed at [facility], [address] [place], [place] [address]   Surgical PCR screen     Status: None   Collection Time: 10/10/20 11:20 PM   Specimen: Nasal Mucosa; Nasal Swab  Result Value Ref Range Status   MRSA, PCR NEGATIVE NEGATIVE Final   Staphylococcus aureus NEGATIVE NEGATIVE Final    Comment: (NOTE) The Xpert SA Assay (FDA approved for NASAL specimens in patients 72 years of age and older), is one component of a comprehensive surveillance program. It is not intended to diagnose infection nor to guide or monitor treatment. Performed at [facility], [address], [place],  [address]          Radiology Studies: No results found.      Scheduled Meds:  acidophilus  1 capsule Oral Daily   ascorbic acid  2,000 mg Oral Daily   bisoprolol  5 mg Oral BID    Chlorhexidine Gluconate Cloth  6 each Topical Daily   docusate sodium  100 mg Oral BID   vitamin B-12  1,000 mcg Oral Daily   And   folic acid  1 mg Oral Daily   And   vitamin B-6  50 mg Oral Daily   furosemide  20 mg Oral BID   insulin aspart  0-15 Units Subcutaneous TID AC & HS   insulin glargine  5 Units Subcutaneous Daily   levothyroxine  50 mcg Oral Q0600   linagliptin  5 mg Oral Daily   magnesium oxide  200 mg Oral Daily   metFORMIN  500 mg  Oral BID AC   multivitamin with minerals   Oral Daily   omega-3 acid ethyl esters  1,000 mg Oral BID   polyethylene glycol  17 g Oral Daily   potassium chloride  10 mEq Oral BID   senna  1 tablet Oral BID   traMADol  50 mg Oral Q6H   Continuous Infusions:   LOS: 6 days    Time spent: 25 minutes    Desma Maxim, MD [facility]   To contact the attending provider between 7A-7P or the covering provider during after hours 7P-7A, please log into the web site [email or web address] and access using universal [place] password for that web site. If you do not have the password, please call the hospital operator.  10/16/2020, 11:11 AM

## 2020-10-16 NOTE — Progress Notes (Signed)
Progress Note  Patient Name: Jodi Reeves Date of Encounter: 10/16/2020  [facility] Cardiologist: Ida Rogue, MD   Subjective   No acute events overnight, working with physical therapy.  Inpatient Medications    Scheduled Meds:  acidophilus  1 capsule Oral Daily   ascorbic acid  2,000 mg Oral Daily   bisoprolol  5 mg Oral BID   Chlorhexidine Gluconate Cloth  6 each Topical Daily   docusate sodium  100 mg Oral BID   vitamin B-12  1,000 mcg Oral Daily   And   folic acid  1 mg Oral Daily   And   vitamin B-6  50 mg Oral Daily   furosemide  20 mg Oral BID   insulin aspart  0-15 Units Subcutaneous TID AC & HS   [START ON 10/17/2020] insulin glargine  10 Units Subcutaneous Daily   levothyroxine  50 mcg Oral Q0600   linagliptin  5 mg Oral Daily   magnesium oxide  200 mg Oral Daily   metFORMIN  500 mg Oral BID AC   multivitamin with minerals   Oral Daily   omega-3 acid ethyl esters  1,000 mg Oral BID   polyethylene glycol  17 g Oral Daily   potassium chloride  10 mEq Oral BID   Rivaroxaban  15 mg Oral Q supper   senna  1 tablet Oral BID   traMADol  50 mg Oral Q6H   Continuous Infusions:  PRN Meds: acetaminophen **OR** acetaminophen, alum & mag hydroxide-simeth, bisacodyl, dimethicone, docusate sodium, HYDROcodone-acetaminophen, ketorolac, labetalol, magnesium hydroxide, morphine injection, olopatadine, ondansetron **OR** ondansetron (ZOFRAN) IV, traZODone   Vital Signs    Vitals:   10/15/20 1637 10/15/20 1957 10/16/20 0409 10/16/20 0824  BP: (!) 105/53 109/74 116/62 123/74  Pulse: 81 80 86 87  Resp: 18 18 18 16   Temp: 98.5 F (36.9 C) 99 F (37.2 C) 98.5 F (36.9 C) 98.2 F (36.8 C)  TempSrc: Oral Oral Oral   SpO2: 98% 97% 96% 96%  Weight:      Height:        Intake/Output Summary (Last 24 hours) at 10/16/2020 1233 Last data filed at 10/16/2020 1018 Gross per 24 hour  Intake 360 ml  Output 400 ml  Net -40 ml   Last 3 Weights 10/12/2020 10/10/2020  10/08/2020  Weight (lbs) 137 lb 2 oz 137 lb 2 oz 137 lb 2 oz  Weight (kg) 62.2 kg 62.2 kg 62.2 kg      Telemetry    Currently off telemetry- Personally Reviewed  ECG    No new ECG- Personally Reviewed  Physical Exam   GEN: No acute distress.   Neck: No JVD Cardiac: Irregularly irregular Respiratory: Points [place] effort, diminished breath sounds at bases GI: Soft, nontender, non-distended  MS: No edema; No deformity. Neuro:  Nonfocal  Psych: Normal affect   Labs    High Sensitivity Troponin:   Recent Labs  Lab 10/08/20 0848  TROPONINIHS 4      Chemistry Recent Labs  Lab 10/14/20 0440 10/15/20 0443 10/16/20 0433  NA 137 138 138  K 4.4 4.2 4.7  CL 106 107 108  CO2 26 26 27   GLUCOSE 176* 132* 170*  BUN 33* 25* 28*  CREATININE 0.65 0.54 0.50  CALCIUM 7.4* 7.3* 7.8*  GFRNONAA >60 >60 >60  ANIONGAP 5 5 3*     Hematology Recent Labs  Lab 10/14/20 0440 10/15/20 0443 10/16/20 0433  WBC 10.1 9.3 7.2  RBC 2.09* 2.62*  2.66*  HGB 7.0* 8.9* 8.8*  HCT 21.3* 26.5* 26.8*  MCV 101.9* 101.1* 100.8*  MCH 33.5 34.0 33.1  MCHC 32.9 33.6 32.8  RDW 14.5 16.0* 15.9*  PLT 163 167 177    BNPNo results for input(s): BNP, PROBNP in the last 168 hours.   DDimer No results for input(s): DDIMER in the last 168 hours.   Radiology    No results found.  Cardiac Studies   Echo 10/2018 1. The left ventricle has normal systolic function, with an ejection  fraction of 55-60%. The cavity size was normal. Left ventricular diastolic  Doppler parameters are consistent with impaired relaxation.   2. The right ventricle has normal systolic function. The cavity was  normal. There is no increase in right ventricular wall thickness. Right  ventricular systolic pressure is moderately elevated with an estimated  pressure of 45.8 mmHg.   3. Tricuspid valve regurgitation is mild-moderate.   4. Left atrial size was moderately dilated.   5. Rhythm is atrial fibrillation   Patient  Profile     87 y.o. female with history of permanent atrial fibrillation, HFpEF, TIA presenting after a fall, sustaining left hip fracture s/p hip surgery.  Patient being seen due to A. fib with RVR  Assessment & Plan     Permanent A. Fib  -Rate controlled -Continue bisoprolol 5 mg twice daily  -Xarelto being held due to anemia. -Due to anemia, frequent falls, long-term anticoagulation management to be discussed with primary cardiologist on outpatient basis, recommend holding Xarelto for now.     2.  Fall, hip fracture  -Management as per surgical team  -Physical therapy, dispo as per medicine team    3.  HFpEF  -PTA Lasix    Continue cardiac medications as above.  No additional cardiac testing or intervention being planned.  Close follow-up with primary cardiologist as outpatient.  Cardiology will sign off.  Please let us know if additional input is needed.    Greater than 50% was spent in counseling and coordination of care with patient  Total encounter time 35 minutes or more.        Signed, Kate Sable, MD  10/16/2020, 12:33 PM

## 2020-10-16 NOTE — Plan of Care (Signed)

## 2020-10-16 NOTE — Progress Notes (Addendum)
Physical Therapy Treatment Patient Details Name: Jodi Reeves MRN: [id number] DOB: 02/22/1928 Today's Date: 10/16/2020    History of Present Illness Pt is a 87 y.o. female s/p fall with a diagnosis of non-surgical L radial head fx and L hip fx s/p IM nail. PMH includes balance disorder, HTN, AF, gait unsteadiness, DM2, PND, CAD, PAD, UTI, kyphoplasty.    PT Comments    Pt was supine in bed with HOB elevated ~ 30 degrees.She is alert and cooperative but only oriented x 2. Requires increased time to respond at times and moves very slowly. No pain endorsed at rest however 6/10 with any/all movements. Was able to exit R side of bed, stand 3 x EOB. Stood pivot to recliner. Recommend +2 assist for transfers while pivoting to R. Pt did struggle to maintain proper PWB status during standing. With Vcs was able to limit wt. Recommend DC to SNF at DC to address deficits while maximizing independence with ADLs.     Follow Up Recommendations  SNF;Supervision/Assistance - 24 hour     Equipment Recommendations  Other (comment) (defer to next level of care)       Precautions / Restrictions Precautions Precautions: Fall Precaution Comments: High Fall Required Braces or Orthoses: Sling Restrictions Weight Bearing Restrictions: Yes LUE Weight Bearing: Weight bearing as tolerated LLE Weight Bearing: Partial weight bearing LLE Partial Weight Bearing Percentage or Pounds: 50    Mobility  Bed Mobility Overal bed mobility: Needs Assistance Bed Mobility: Supine to Sit Rolling: Max assist Sidelying to sit: Max assist Supine to sit: Max assist     General bed mobility comments: Pt was able to achieve EOB sitting with max assist of one. increased time to perform with vcs throughout for sequencing and technique improvements    Transfers Overall transfer level: Needs assistance Equipment used: Left platform walker Transfers: Sit to/from Stand Sit to Stand: Mod assist;From elevated surface (mod  assist of one)         General transfer comment: Pt was able to STS 3 x EOB prior to stand pivot to recliner. vcs for handplacement and technique improvements. stood from slightly elevated bed height. Will return to assist RN staff with stand pivot back to bed. recommend pivot to R. struggles with PWB throughout standing activity.  Ambulation/Gait             General Gait Details: elected not to attempt ambulation due to inability to maintain PWB (50%)       Balance Overall balance assessment: Needs assistance Sitting-balance support: Feet supported;Single extremity supported Sitting balance-Leahy Scale: Fair     Standing balance support: Bilateral upper extremity supported Standing balance-Leahy Scale: Poor         Cognition Arousal/Alertness: Awake/alert Behavior During Therapy: WFL for tasks assessed/performed Overall Cognitive Status: No family/caregiver present to determine baseline cognitive functioning        General Comments: Pt was alert and cooperative but slow processing. Oriented x 2. required increased time for all desired task due to pain             Pertinent Vitals/Pain Pain Assessment: No/denies pain (denies pain at rest however c/o 6/10 pain with mobility) Pain Score: 6  Pain Location: Low back and L hip Pain Descriptors / Indicators: Aching;Sore;Grimacing;Moaning Pain Intervention(s): Limited activity within patient's tolerance;Monitored during session;Repositioned;Premedicated before session     PT Goals (current goals can now be found in the care plan section) Acute Rehab PT Goals Patient Stated Goal: To go to rehab. Progress  towards PT goals: Progressing toward goals    Frequency    7X/week      PT Plan Current plan remains appropriate       AM-PAC PT "6 Clicks" Mobility   Outcome Measure  Help needed turning from your back to your side while in a flat bed without using bedrails?: A Lot Help needed moving from lying on your  back to sitting on the side of a flat bed without using bedrails?: A Lot Help needed moving to and from a bed to a chair (including a wheelchair)?: A Lot Help needed standing up from a chair using your arms (e.g., wheelchair or bedside chair)?: Total Help needed to walk in hospital room?: Total Help needed climbing 3-5 steps with a railing? : Total 6 Click Score: 9    End of Session Equipment Utilized During Treatment: Gait belt Activity Tolerance: Patient limited by pain Patient left: in chair;with call bell/phone within reach;with chair alarm set Nurse Communication: Mobility status;Weight bearing status PT Visit Diagnosis: Unsteadiness on feet (R26.81);History of falling (Z91.81);Muscle weakness (generalized) (M62.81);Other abnormalities of gait and mobility (R26.89);Pain Pain - Right/Left: Left Pain - part of body: Hip     Time: 1643-5391 PT Time Calculation (min) (ACUTE ONLY): 28 min  Charges:  $Therapeutic Activity: 23-37 mins                     Julaine Fusi PTA 10/16/20, 11:33 AM

## 2020-10-17 LAB — BASIC METABOLIC PANEL
Anion gap: 3 — ABNORMAL LOW (ref 5–15)
BUN: 35 mg/dL — ABNORMAL HIGH (ref 8–23)
CO2: 27 mmol/L (ref 22–32)
Calcium: 8 mg/dL — ABNORMAL LOW (ref 8.9–10.3)
Chloride: 106 mmol/L (ref 98–111)
Creatinine, Ser: 0.71 mg/dL (ref 0.44–1.00)
GFR, Estimated: >60 mL/min (ref >60)
Glucose, Bld: 151 mg/dL — ABNORMAL HIGH (ref 70–99)
Potassium: 5.3 mmol/L — ABNORMAL HIGH (ref 3.5–5.1)
Sodium: 136 mmol/L (ref 135–145)

## 2020-10-17 LAB — URINALYSIS, COMPLETE (UACMP) WITH MICROSCOPIC
Bilirubin Urine: NEGATIVE
Glucose, UA: NEGATIVE mg/dL
Hgb urine dipstick: NEGATIVE
Ketones, ur: NEGATIVE mg/dL
Nitrite: NEGATIVE
Protein, ur: NEGATIVE mg/dL
Specific Gravity, Urine: 1.014 (ref 1.005–1.030)
pH: 6 (ref 5.0–8.0)

## 2020-10-17 LAB — GLUCOSE, CAPILLARY
Glucose-Capillary: 155 mg/dL — ABNORMAL HIGH (ref 70–99)
Glucose-Capillary: 137 mg/dL — ABNORMAL HIGH (ref 70–99)
Glucose-Capillary: 68 mg/dL — ABNORMAL LOW (ref 70–99)
Glucose-Capillary: 114 mg/dL — ABNORMAL HIGH (ref 70–99)
Glucose-Capillary: 190 mg/dL — ABNORMAL HIGH (ref 70–99)

## 2020-10-17 LAB — CBC
HCT: 26.3 % — ABNORMAL LOW (ref 36.0–46.0)
Hemoglobin: 8.6 g/dL — ABNORMAL LOW (ref 12.0–15.0)
MCH: 34 pg (ref 26.0–34.0)
MCHC: 32.7 g/dL (ref 30.0–36.0)
MCV: 104 fL — ABNORMAL HIGH (ref 80.0–100.0)
Platelets: 186 10*3/uL (ref 150–400)
RBC: 2.53 MIL/uL — ABNORMAL LOW (ref 3.87–5.11)
RDW: 15.7 % — ABNORMAL HIGH (ref 11.5–15.5)
WBC: 8.4 10*3/uL (ref 4.0–10.5)
nRBC: 0.4 % — ABNORMAL HIGH (ref 0.0–0.2)

## 2020-10-17 MED ORDER — ENOXAPARIN SODIUM 40 MG/0.4ML IJ SOSY
40.0000 mg | PREFILLED_SYRINGE | INTRAMUSCULAR | Status: DC
  Administered 2020-10-18: 40 mg via SUBCUTANEOUS
  Filled 2020-10-17: qty 0.4

## 2020-10-17 NOTE — Progress Notes (Signed)
Subjective: 5 Days Post-Op Procedure(s) (LRB): INTRAMEDULLARY (IM) NAIL INTERTROCHANTRIC (Left) Patient is awake and alert today.  Her son is at the bedside.  She still has some serous drainage but no redness.  Range of motion is mildly painful.  Patient reports pain as mild.  Objective:   VITALS:   Vitals:   10/17/20 0459 10/17/20 0805  BP: 110/60 115/66  Pulse: 75 78  Resp: 18 17  Temp: 98.4 F (36.9 C) 98.9 F (37.2 C)  SpO2: 96% 96%    Neurologically intact Incision: moderate drainage  LABS Recent Labs    10/15/20 0443 10/16/20 0433 10/17/20 0407  HGB 8.9* 8.8* 8.6*  HCT 26.5* 26.8* 26.3*  WBC 9.3 7.2 8.4  PLT 167 177 186    Recent Labs    10/15/20 0443 10/16/20 0433 10/17/20 0407  NA 138 138 136  K 4.2 4.7 5.3*  BUN 25* 28* 35*  CREATININE 0.54 0.50 0.71  GLUCOSE 132* 170* 151*    No results for input(s): LABPT, INR in the last 72 hours.   Assessment/Plan: 5 Days Post-Op Procedure(s) (LRB): INTRAMEDULLARY (IM) NAIL INTERTROCHANTRIC (Left)   Advance diet Up with therapy Discharge to SNF

## 2020-10-17 NOTE — Progress Notes (Signed)
PROGRESS NOTE    Jodi Reeves  WFU:[id number] DOB: [birth year] DOA: 10/10/2020 PCP: Jodi Reeves, Jodi Glow, MD   Chief complaint.  Hip pain. Brief Narrative:  Jodi Reeves is a 87 y.o. female with medical history significant for multiple medical problems that are mentioned below, including atrial fibrillation with anticoagulation with Xarelto, who presented to the emergency room with acute onset of left hip pain.  The patient was turning off the light switch when she stepped back and lost her balance and fell on the floor.  She sustained left hip fracture.  She has been seen by Dr. Mack Guise, planning for hip surgery 6/21.   Assessment & Plan:   Active Problems:   Hypothyroidism   Essential hypertension   Permanent atrial fibrillation (HCC)   Chronic diastolic CHF (congestive heart failure) (HCC)   Type 2 diabetes mellitus with diabetic polyneuropathy, without long-term current use of insulin (HCC)   Closed left hip fracture (HCC)   Acute blood loss anemia   Thrombocytopenia (HCC)   Closed intertrochanteric fracture of hip, left, initial encounter ([place])  Left hip fracture. S/p operative repair 6/21.  - pt/ot consulted, plan for SNF, SW aware - morphine/hydrocodone/tramadol for pain control, miralax/senna/docusate  Delirium Mild, stable. Pain appears adequately controlled - delirium precautions - son requests urinalysis. No overt uti symptoms.   Hyperkalemia Mild, 5.3 - will hold home KCl supplement as well as prn toradol  Acute blood loss anemia 2/2 hip fracture. Has received 2 units prbcs, last unit was 6/23. Hgb stable in 8s since then - monitor  Chronic diastolic congestive heart failure. Permanent atrial fibrillation. Patient had episode of rapid ventricular response that is improved. Cardiology following - in light of frequent falls cardiology advises holding anticoagulation for now - cont home bisoprolol and lasix - outpt cardiology f/u  Oxygen  requirement Now weaned off O2 - monitor  Macrocytosis 12/folate wnl  Type 2 diabetes. Continue sliding scale insulin as well as home tradjenta. Pt refuses metformin. Fast[place] elevated, will increase lantus from 5 to 10    DVT prophylaxis: lovenox Code Status: full Family Communication: updated son @ bedside 6/26 Disposition Plan: SNF, bed search taking place  Status is: Inpatient  Remains inpatient appropriate because:Inpatient level of care appropriate due to severity of illness  Dispo: The patient is from: Home              Anticipated d/c is to: SNF              Patient currently is not medically stable to d/c.   Difficult to place patient No     I/O last 3 completed shifts: In: 670 [P.O.:670] Out: 500 [Urine:500] No intake/output data recorded.     Consultants:  Orthopedics, cardiology  Procedures: Hip today  Antimicrobials: None  Subjective: Complains of pain with movement. Breathing stable. Tolerating diet. Pain improved some. No dysuria  Objective: Vitals:   10/16/20 2312 10/17/20 0459 10/17/20 0805 10/17/20 1220  BP: (!) 94/53 110/60 115/66 110/62  Pulse: 82 75 78 71  Resp: 18 18 17 19   Temp: 97.8 F (36.6 C) 98.4 F (36.9 C) 98.9 F (37.2 C) 98.6 F (37 C)  TempSrc: Oral Oral Oral Oral  SpO2: 96% 96% 96% 97%  Weight:      Height:        Intake/Output Summary (Last 24 hours) at 10/17/2020 1327 Last data filed at 10/17/2020 1018 Gross per 24 hour  Intake 550 ml  Output 500 ml  Net 50  ml   Filed Weights   10/10/20 0230 10/12/20 1429  Weight: 62.2 kg 62.2 kg    Examination:  General exam: Appears calm and comfortable  Respiratory system: Clear to auscultation. Respiratory effort normal. Cardiovascular system: Irregularly irregular, mildly tachycardic.  No JVD, murmurs, rubs, gallops or clicks.  Gastrointestinal system: Abdomen is nondistended, soft and nontender. No organomegaly or masses felt. Normal bowel sounds heard. Central  nervous system: Alert and oriented x3. No focal neurological deficits. Extremities: trace LE edema. : hip bandaged Skin: bandage left lateral hip serous drainage Psychiatry: confused    Data Reviewed: I have personally reviewed following labs and imaging studies  CBC: Recent Labs  Lab 10/12/20 0613 10/13/20 0521 10/14/20 0440 10/15/20 0443 10/16/20 0433 10/17/20 0407  WBC 13.0* 9.7 10.1 9.3 7.2 8.4  NEUTROABS 10.3* 8.2*  --   --   --   --   HGB 9.8* 8.6* 7.0* 8.9* 8.8* 8.6*  HCT 29.0* 26.2* 21.3* 26.5* 26.8* 26.3*  MCV 101.0* 104.0* 101.9* 101.1* 100.8* 104.0*  PLT 137* 145* 163 167 177 109   Basic Metabolic Panel: Recent Labs  Lab 10/11/20 0454 10/12/20 0613 10/13/20 0521 10/13/20 1917 10/14/20 0440 10/15/20 0443 10/16/20 0433 10/17/20 0407  NA 137 137 138 134* 137 138 138 136  K 4.4 3.7 4.0 4.1 4.4 4.2 4.7 5.3*  CL 108 104 107 103 106 107 108 106  CO2 24 25 24 24 26 26 27 27   GLUCOSE 165* 202* 186* 314* 176* 132* 170* 151*  BUN 15 15 23  34* 33* 25* 28* 35*  CREATININE 0.56 0.61 0.71 0.89 0.65 0.54 0.50 0.71  CALCIUM 7.6* 7.8* 7.3* 7.5* 7.4* 7.3* 7.8* 8.0*  MG 2.0 2.0 2.1  --   --   --   --   --    GFR: Estimated Creatinine Clearance: 37.9 mL/min (by C-G formula based on SCr of 0.71 mg/dL). Liver Function Tests: No results for input(s): AST, ALT, ALKPHOS, BILITOT, PROT, ALBUMIN in the last 168 hours.  No results for input(s): LIPASE, AMYLASE in the last 168 hours. No results for input(s): AMMONIA in the last 168 hours. Coagulation Profile: No results for input(s): INR, PROTIME in the last 168 hours.  Cardiac Enzymes: No results for input(s): CKTOTAL, CKMB, CKMBINDEX, TROPONINI in the last 168 hours. BNP (last 3 results) No results for input(s): PROBNP in the last 8760 hours. HbA1C: No results for input(s): HGBA1C in the last 72 hours.  CBG: Recent Labs  Lab 10/16/20 1139 10/16/20 1601 10/16/20 2030 10/17/20 0745 10/17/20 1152  GLUCAP 248* 220*  96 155* 137*   Lipid Profile: No results for input(s): CHOL, HDL, LDLCALC, TRIG, CHOLHDL, LDLDIRECT in the last 72 hours. Thyroid Function Tests: No results for input(s): TSH, T4TOTAL, FREET4, T3FREE, THYROIDAB in the last 72 hours. Anemia Panel: No results for input(s): VITAMINB12, FOLATE, FERRITIN, TIBC, IRON, RETICCTPCT in the last 72 hours.  Sepsis Labs: No results for input(s): PROCALCITON, LATICACIDVEN in the last 168 hours.  Recent Results (from the past 240 hour(s))  Resp Panel by RT-PCR (Flu A&B, Covid) Nasopharyngeal Swab     Status: None   Collection Time: 10/10/20  3:21 AM   Specimen: Nasopharyngeal Swab; Nasopharyngeal(NP) swabs in vial transport medium  Result Value Ref Range Status   SARS Coronavirus 2 by RT PCR NEGATIVE NEGATIVE Final    Comment: (NOTE) SARS-CoV-2 target nucleic acids are NOT DETECTED.  The SARS-CoV-2 RNA is generally detectable in upper respiratory specimens during the acute phase of  infection. The lowest concentration of SARS-CoV-2 viral copies this assay can detect is 138 copies/mL. A negative result does not preclude SARS-Cov-2 infection and should not be used as the sole basis for treatment or other patient management decisions. A negative result may occur with  improper specimen collection/handling, submission of specimen other than nasopharyngeal swab, presence of viral mutation(s) within the areas targeted by this assay, and inadequate number of viral copies(<138 copies/mL). A negative result must be combined with clinical observations, patient history, and epidemiological information. The expected result is Negative.  Fact Sheet for Patients:  EntrepreneurPulse.com.au  Fact Sheet for Healthcare Providers:  IncredibleEmployment.be  This test is no t yet approved or cleared by the [place] FDA and  has been authorized for detection and/or diagnosis of SARS-CoV-2 by FDA under an Emergency Use  Authorization (EUA). This EUA will remain  in effect (meaning this test can be used) for the duration of the COVID-19 declaration under Section 564(b)(1) of the Act, 21 U.S.C.section 360bbb-3(b)(1), unless the authorization is terminated  or revoked sooner.       Influenza A by PCR NEGATIVE NEGATIVE Final   Influenza B by PCR NEGATIVE NEGATIVE Final    Comment: (NOTE) The Xpert Xpress SARS-CoV-2/FLU/RSV plus assay is intended as an aid in the diagnosis of influenza from Nasopharyngeal swab specimens and should not be used as a sole basis for treatment. Nasal washings and aspirates are unacceptable for Xpert Xpress SARS-CoV-2/FLU/RSV testing.  Fact Sheet for Patients: EntrepreneurPulse.com.au  Fact Sheet for Healthcare Providers: IncredibleEmployment.be  This test is not yet approved or cleared by the [place] FDA and has been authorized for detection and/or diagnosis of SARS-CoV-2 by FDA under an Emergency Use Authorization (EUA). This EUA will remain in effect (meaning this test can be used) for the duration of the COVID-19 declaration under Section 564(b)(1) of the Act, 21 U.S.C. section 360bbb-3(b)(1), unless the authorization is terminated or revoked.  Performed at [facility], [address], [place], [place] [address]   Surgical PCR screen     Status: None   Collection Time: 10/10/20 11:20 PM   Specimen: Nasal Mucosa; Nasal Swab  Result Value Ref Range Status   MRSA, PCR NEGATIVE NEGATIVE Final   Staphylococcus aureus NEGATIVE NEGATIVE Final    Comment: (NOTE) The Xpert SA Assay (FDA approved for NASAL specimens in patients 94 years of age and older), is one component of a comprehensive surveillance program. It is not intended to diagnose infection nor to guide or monitor treatment. Performed at [facility], [address] [place], [place] [address]          Radiology Studies: No results  found.      Scheduled Meds:  acidophilus  1 capsule Oral Daily   ascorbic acid  2,000 mg Oral Daily   bisoprolol  5 mg Oral BID   Chlorhexidine Gluconate Cloth  6 each Topical Daily   docusate sodium  100 mg Oral BID   [START ON 10/18/2020] enoxaparin (LOVENOX) injection  40 mg Subcutaneous Q24H   vitamin B-12  1,000 mcg Oral Daily   And   folic acid  1 mg Oral Daily   And   vitamin B-6  50 mg Oral Daily   furosemide  20 mg Oral BID   insulin aspart  0-15 Units Subcutaneous TID AC & HS   insulin glargine  10 Units Subcutaneous Daily   levothyroxine  50 mcg Oral Q0600   linagliptin  5 mg Oral Daily  magnesium oxide  200 mg Oral Daily   metFORMIN  500 mg Oral BID AC   multivitamin with minerals   Oral Daily   omega-3 acid ethyl esters  1,000 mg Oral BID   polyethylene glycol  17 g Oral Daily   senna  1 tablet Oral BID   traMADol  50 mg Oral Q6H   Continuous Infusions:   LOS: 7 days    Time spent: 25 minutes    Desma Maxim, MD [facility]   To contact the attending provider between 7A-7P or the covering provider during after hours 7P-7A, please log into the web site [email or web address] and access using universal Jodi Reeves password for that web site. If you do not have the password, please call the hospital operator.  10/17/2020, 1:27 PM

## 2020-10-17 NOTE — Progress Notes (Signed)
Physical Therapy Treatment Patient Details Name: Jodi Reeves MRN: [id number] DOB: 03/25/1928 Today's Date: 10/17/2020    History of Present Illness Pt is a 87 y.o. female s/p fall with a diagnosis of non-surgical L radial head fx and L hip fx s/p IM nail. PMH includes balance disorder, HTN, AF, gait unsteadiness, DM2, PND, CAD, PAD, UTI, kyphoplasty.    PT Comments    The pt presents this session with c/o feeling wet and also with nausea. She is able to demonstrate improved independence with rolling in order to change bedding, but is unable to tolerate further mobility this session. PT will continue to follow in order to progress functional mobility. Current plan for SNF remain appropriate.     Follow Up Recommendations  SNF;Supervision/Assistance - 24 hour     Equipment Recommendations       Recommendations for Other Services       Precautions / Restrictions Precautions Precautions: Fall Precaution Comments: High Fall Required Braces or Orthoses: Sling Restrictions Weight Bearing Restrictions: Yes LUE Weight Bearing: Weight bear through elbow only LUE Partial Weight Bearing Percentage or Pounds: WBAT with PFRW only LLE Weight Bearing: Partial weight bearing LLE Partial Weight Bearing Percentage or Pounds: 50 Other Position/Activity Restrictions: Per Dr. Mack Guise pt may WBAT throught the LUE with a platform walker, may have sling off with therapy.    Mobility  Bed Mobility Overal bed mobility: Needs Assistance Bed Mobility: Rolling Rolling: Mod assist         General bed mobility comments: Pt with c/o nausea and only able to tolerate bilateral rolling this session.    Transfers                    Ambulation/Gait                 Stairs             Wheelchair Mobility    Modified Rankin (Stroke Patients Only)       Balance                                            Cognition Arousal/Alertness:  Awake/alert Behavior During Therapy: WFL for tasks assessed/performed Overall Cognitive Status: Within Functional Limits for tasks assessed                                        Exercises      General Comments        Pertinent Vitals/Pain Pain Assessment: No/denies pain    Home Living                      Prior Function            PT Goals (current goals can now be found in the care plan section) Acute Rehab PT Goals Patient Stated Goal: To go to rehab. PT Goal Formulation: With patient Time For Goal Achievement: 10/26/20 Potential to Achieve Goals: Fair Progress towards PT goals: Progressing toward goals    Frequency    7X/week      PT Plan Current plan remains appropriate    Co-evaluation              AM-PAC PT "6 Clicks" Mobility   Outcome Measure  Help needed  turning from your back to your side while in a flat bed without using bedrails?: A Lot Help needed moving from lying on your back to sitting on the side of a flat bed without using bedrails?: A Lot Help needed moving to and from a bed to a chair (including a wheelchair)?: A Lot Help needed standing up from a chair using your arms (e.g., wheelchair or bedside chair)?: Total Help needed to walk in hospital room?: Total   6 Click Score: 8    End of Session   Activity Tolerance: Patient limited by pain Patient left: in chair;with call bell/phone within reach;with chair alarm set Nurse Communication: Mobility status;Weight bearing status PT Visit Diagnosis: Unsteadiness on feet (R26.81);History of falling (Z91.81);Muscle weakness (generalized) (M62.81);Other abnormalities of gait and mobility (R26.89);Pain Pain - Right/Left: Left Pain - part of body: Hip     Time: 1020-1100 PT Time Calculation (min) (ACUTE ONLY): 40 min  Charges:  $Therapeutic Activity: 38-52 mins                     12:24 PM, 10/17/20 Eilam Shrewsbury A. Saverio Danker PT, DPT Physical Therapist - [facility] A Brandon Scarbrough 10/17/2020, 12:23 PM

## 2020-10-18 LAB — GLUCOSE, CAPILLARY
Glucose-Capillary: 155 mg/dL — ABNORMAL HIGH (ref 70–99)
Glucose-Capillary: 211 mg/dL — ABNORMAL HIGH (ref 70–99)
Glucose-Capillary: 80 mg/dL (ref 70–99)
Glucose-Capillary: 172 mg/dL — ABNORMAL HIGH (ref 70–99)

## 2020-10-18 LAB — CBC
HCT: 27.3 % — ABNORMAL LOW (ref 36.0–46.0)
Hemoglobin: 8.9 g/dL — ABNORMAL LOW (ref 12.0–15.0)
MCH: 33.1 pg (ref 26.0–34.0)
MCHC: 32.6 g/dL (ref 30.0–36.0)
MCV: 101.5 fL — ABNORMAL HIGH (ref 80.0–100.0)
Platelets: 204 10*3/uL (ref 150–400)
RBC: 2.69 MIL/uL — ABNORMAL LOW (ref 3.87–5.11)
RDW: 15.8 % — ABNORMAL HIGH (ref 11.5–15.5)
WBC: 7.5 10*3/uL (ref 4.0–10.5)
nRBC: 0 % (ref 0.0–0.2)

## 2020-10-18 LAB — BASIC METABOLIC PANEL
Anion gap: 6 (ref 5–15)
BUN: 27 mg/dL — ABNORMAL HIGH (ref 8–23)
CO2: 28 mmol/L (ref 22–32)
Calcium: 7.9 mg/dL — ABNORMAL LOW (ref 8.9–10.3)
Chloride: 102 mmol/L (ref 98–111)
Creatinine, Ser: 0.59 mg/dL (ref 0.44–1.00)
GFR, Estimated: >60 mL/min (ref >60)
Glucose, Bld: 148 mg/dL — ABNORMAL HIGH (ref 70–99)
Potassium: 4.6 mmol/L (ref 3.5–5.1)
Sodium: 136 mmol/L (ref 135–145)

## 2020-10-18 MED ORDER — ASPIRIN 325 MG PO TABS
325.0000 mg | ORAL_TABLET | Freq: Two times a day (BID) | ORAL | Status: DC
  Administered 2020-10-19 – 2020-10-20 (×3): 325 mg via ORAL
  Filled 2020-10-18 (×4): qty 1

## 2020-10-18 NOTE — Progress Notes (Signed)
PROGRESS NOTE    Jodi Reeves  VVO:[id number] DOB: 1927-11-01 DOA: 10/10/2020 PCP: [name], Nino Glow, MD   Chief complaint.  Hip pain. Brief Narrative:  Jodi Reeves is a 87 y.o. female with medical history significant for multiple medical problems that are mentioned below, including atrial fibrillation with anticoagulation with Xarelto, who presented to the emergency room with acute onset of left hip pain.  The patient was turning off the light switch when she stepped back and lost her balance and fell on the floor.  She sustained left hip fracture.  She has been seen by Dr. Mack Guise, planning for hip surgery 6/21.   Assessment & Plan:   Active Problems:   Hypothyroidism   Essential hypertension   Permanent atrial fibrillation (HCC)   Chronic diastolic CHF (congestive heart failure) (HCC)   Type 2 diabetes mellitus with diabetic polyneuropathy, without long-term current use of insulin (HCC)   Closed left hip fracture (HCC)   Acute blood loss anemia   Thrombocytopenia (HCC)   Closed intertrochanteric fracture of hip, left, initial encounter ([place])  Left hip fracture. S/p operative repair 6/21.  - pt/ot consulted, plan for SNF, SW aware - morphine/hydrocodone/tramadol for pain control, miralax/senna/docusate - given serous drainage ortho advises asa 325 bid for dvt ppx in lieu of lovenox  Delirium Mild, stable. Pain appears adequately controlled - delirium precautions - son requests urinalysis. Ua not strongly suggestive of infection. Pt denies dysuria, frequency, suprapubic pain/tenderness. Will f/u culture, holding on treating  Hyperkalemia Resolved w/ holding home K supplement  Acute blood loss anemia 2/2 hip fracture. Has received 2 units prbcs, last unit was 6/23. Hgb stable in 8s since then - monitor  Chronic diastolic congestive heart failure. Permanent atrial fibrillation. Patient had episode of rapid ventricular response that is improved. Cardiology  following - in light of frequent falls cardiology advises holding anticoagulation for now - cont home bisoprolol and lasix - outpt cardiology f/u  Oxygen requirement Now weaned off O2 - monitor  Macrocytosis 12/folate wnl  Type 2 diabetes. Continue sliding scale insulin as well as home tradjenta. Pt refuses metformin. Continuing lantus    DVT prophylaxis: lovenox Code Status: full Family Communication: updated son @ bedside 6/26 Disposition Plan: SNF, bed search taking place  Status is: Inpatient  Remains inpatient appropriate because:Inpatient level of care appropriate due to severity of illness  Dispo: The patient is from: Home              Anticipated d/c is to: SNF              Patient currently is not medically stable to d/c.   Difficult to place patient No     I/O last 3 completed shifts: In: 360 [P.O.:360] Out: 2275 [Urine:2275] Total I/O In: 240 [P.O.:240] Out: -      Consultants:  Orthopedics, cardiology  Procedures: Hip today  Antimicrobials: None  Subjective: Complains of pain with movement. Breathing stable. Tolerating diet. Pain improved some. No dysuria  Objective: Vitals:   10/17/20 1516 10/17/20 2012 10/18/20 0425 10/18/20 0842  BP: 130/75 113/70 134/84 140/71  Pulse: 82 (!) 104 98 80  Resp: 16 16 16 16   Temp: 98.2 F (36.8 C) 98.7 F (37.1 C) 98.6 F (37 C) 97.9 F (36.6 C)  TempSrc: Oral Oral Oral Oral  SpO2: 98% 98% 97% 93%  Weight:      Height:        Intake/Output Summary (Last 24 hours) at 10/18/2020 1127 Last data  filed at 10/18/2020 1003 Gross per 24 hour  Intake 600 ml  Output 1775 ml  Net -1175 ml   Filed Weights   10/10/20 0230 10/12/20 1429  Weight: 62.2 kg 62.2 kg    Examination:  General exam: Appears calm and comfortable  Respiratory system: Clear to auscultation. Respiratory effort normal. Cardiovascular system: Irregularly irregular, mildly tachycardic.  No JVD, murmurs, rubs, gallops or clicks.   Gastrointestinal system: Abdomen is nondistended, soft and nontender. No organomegaly or masses felt. Normal bowel sounds heard. Central nervous system: Alert and oriented x3. No focal neurological deficits. Extremities: trace LE edema. : hip bandaged Skin: bandage left lateral hip serous drainage Psychiatry: confused    Data Reviewed: I have personally reviewed following labs and imaging studies  CBC: Recent Labs  Lab 10/12/20 0613 10/13/20 0521 10/14/20 0440 10/15/20 0443 10/16/20 0433 10/17/20 0407 10/18/20 0531  WBC 13.0* 9.7 10.1 9.3 7.2 8.4 7.5  NEUTROABS 10.3* 8.2*  --   --   --   --   --   HGB 9.8* 8.6* 7.0* 8.9* 8.8* 8.6* 8.9*  HCT 29.0* 26.2* 21.3* 26.5* 26.8* 26.3* 27.3*  MCV 101.0* 104.0* 101.9* 101.1* 100.8* 104.0* 101.5*  PLT 137* 145* 163 167 177 186 224   Basic Metabolic Panel: Recent Labs  Lab 10/12/20 0613 10/13/20 0521 10/13/20 1917 10/14/20 0440 10/15/20 0443 10/16/20 0433 10/17/20 0407 10/18/20 0531  NA 137 138   < > 137 138 138 136 136  K 3.7 4.0   < > 4.4 4.2 4.7 5.3* 4.6  CL 104 107   < > 106 107 108 106 102  CO2 25 24   < > 26 26 27 27 28   GLUCOSE 202* 186*   < > 176* 132* 170* 151* 148*  BUN 15 23   < > 33* 25* 28* 35* 27*  CREATININE 0.61 0.71   < > 0.65 0.54 0.50 0.71 0.59  CALCIUM 7.8* 7.3*   < > 7.4* 7.3* 7.8* 8.0* 7.9*  MG 2.0 2.1  --   --   --   --   --   --    < > = values in this interval not displayed.   GFR: Estimated Creatinine Clearance: 37.9 mL/min (by C-G formula based on SCr of 0.59 mg/dL). Liver Function Tests: No results for input(s): AST, ALT, ALKPHOS, BILITOT, PROT, ALBUMIN in the last 168 hours.  No results for input(s): LIPASE, AMYLASE in the last 168 hours. No results for input(s): AMMONIA in the last 168 hours. Coagulation Profile: No results for input(s): INR, PROTIME in the last 168 hours.  Cardiac Enzymes: No results for input(s): CKTOTAL, CKMB, CKMBINDEX, TROPONINI in the last 168 hours. BNP (last 3  results) No results for input(s): PROBNP in the last 8760 hours. HbA1C: No results for input(s): HGBA1C in the last 72 hours.  CBG: Recent Labs  Lab 10/17/20 1152 10/17/20 1655 10/17/20 1755 10/17/20 2121 10/18/20 0808  GLUCAP 137* 68* 114* 190* 155*   Lipid Profile: No results for input(s): CHOL, HDL, LDLCALC, TRIG, CHOLHDL, LDLDIRECT in the last 72 hours. Thyroid Function Tests: No results for input(s): TSH, T4TOTAL, FREET4, T3FREE, THYROIDAB in the last 72 hours. Anemia Panel: No results for input(s): VITAMINB12, FOLATE, FERRITIN, TIBC, IRON, RETICCTPCT in the last 72 hours.  Sepsis Labs: No results for input(s): PROCALCITON, LATICACIDVEN in the last 168 hours.  Recent Results (from the past 240 hour(s))  Resp Panel by RT-PCR (Flu A&B, Covid) Nasopharyngeal Swab  Status: None   Collection Time: 10/10/20  3:21 AM   Specimen: Nasopharyngeal Swab; Nasopharyngeal(NP) swabs in vial transport medium  Result Value Ref Range Status   SARS Coronavirus 2 by RT PCR NEGATIVE NEGATIVE Final    Comment: (NOTE) SARS-CoV-2 target nucleic acids are NOT DETECTED.  The SARS-CoV-2 RNA is generally detectable in upper respiratory specimens during the acute phase of infection. The lowest concentration of SARS-CoV-2 viral copies this assay can detect is 138 copies/mL. A negative result does not preclude SARS-Cov-2 infection and should not be used as the sole basis for treatment or other patient management decisions. A negative result may occur with  improper specimen collection/handling, submission of specimen other than nasopharyngeal swab, presence of viral mutation(s) within the areas targeted by this assay, and inadequate number of viral copies(<138 copies/mL). A negative result must be combined with clinical observations, patient history, and epidemiological information. The expected result is Negative.  Fact Sheet for Patients:  EntrepreneurPulse.com.au  Fact  Sheet for Healthcare Providers:  IncredibleEmployment.be  This test is no t yet approved or cleared by the [place] FDA and  has been authorized for detection and/or diagnosis of SARS-CoV-2 by FDA under an Emergency Use Authorization (EUA). This EUA will remain  in effect (meaning this test can be used) for the duration of the COVID-19 declaration under Section 564(b)(1) of the Act, 21 U.S.C.section 360bbb-3(b)(1), unless the authorization is terminated  or revoked sooner.       Influenza A by PCR NEGATIVE NEGATIVE Final   Influenza B by PCR NEGATIVE NEGATIVE Final    Comment: (NOTE) The Xpert Xpress SARS-CoV-2/FLU/RSV plus assay is intended as an aid in the diagnosis of influenza from Nasopharyngeal swab specimens and should not be used as a sole basis for treatment. Nasal washings and aspirates are unacceptable for Xpert Xpress SARS-CoV-2/FLU/RSV testing.  Fact Sheet for Patients: EntrepreneurPulse.com.au  Fact Sheet for Healthcare Providers: IncredibleEmployment.be  This test is not yet approved or cleared by the [place] FDA and has been authorized for detection and/or diagnosis of SARS-CoV-2 by FDA under an Emergency Use Authorization (EUA). This EUA will remain in effect (meaning this test can be used) for the duration of the COVID-19 declaration under Section 564(b)(1) of the Act, 21 U.S.C. section 360bbb-3(b)(1), unless the authorization is terminated or revoked.  Performed at [facility], [address], [place], [place] [address]   Surgical PCR screen     Status: None   Collection Time: 10/10/20 11:20 PM   Specimen: Nasal Mucosa; Nasal Swab  Result Value Ref Range Status   MRSA, PCR NEGATIVE NEGATIVE Final   Staphylococcus aureus NEGATIVE NEGATIVE Final    Comment: (NOTE) The Xpert SA Assay (FDA approved for NASAL specimens in patients 17 years of age and older), is one component of a  comprehensive surveillance program. It is not intended to diagnose infection nor to guide or monitor treatment. Performed at [facility], [address] [place], [place] [address]          Radiology Studies: No results found.      Scheduled Meds:  acidophilus  1 capsule Oral Daily   ascorbic acid  2,000 mg Oral Daily   bisoprolol  5 mg Oral BID   Chlorhexidine Gluconate Cloth  6 each Topical Daily   docusate sodium  100 mg Oral BID   enoxaparin (LOVENOX) injection  40 mg Subcutaneous Q24H   vitamin B-12  1,000 mcg Oral Daily   And   folic  acid  1 mg Oral Daily   And   vitamin B-6  50 mg Oral Daily   furosemide  20 mg Oral BID   insulin aspart  0-15 Units Subcutaneous TID AC & HS   insulin glargine  10 Units Subcutaneous Daily   levothyroxine  50 mcg Oral Q0600   linagliptin  5 mg Oral Daily   magnesium oxide  200 mg Oral Daily   metFORMIN  500 mg Oral BID AC   multivitamin with minerals   Oral Daily   omega-3 acid ethyl esters  1,000 mg Oral BID   polyethylene glycol  17 g Oral Daily   senna  1 tablet Oral BID   traMADol  50 mg Oral Q6H   Continuous Infusions:   LOS: 8 days    Time spent: 25 minutes    Desma Maxim, MD [facility]   To contact the attending provider between 7A-7P or the covering provider during after hours 7P-7A, please log into the web site [email or web address] and access using universal [place] password for that web site. If you do not have the password, please call the hospital operator.  10/18/2020, 11:27 AM

## 2020-10-18 NOTE — TOC Progression Note (Signed)
Transition of Care [facility]) - Progression Note    Patient Details  Name: [name] MRN: [id number] Date of Birth: 1927/11/05  Transition of Care [facility]) CM/SW [place], RN Phone Number: 10/18/2020, 9:10 AM  Clinical Narrative:      Timothy Lasso called and stated that he would like me to fax the bed request to [facility], [phone], Lockie Pares, I sent the fax, he stated that he does ot feel that his mom is ready to DC yet, he styated that she is more confused than her normal self, I explained that often when a patient has dementia and goes into the hospital it can increase their confusion, It could be due to being out of their normal environment, he stated that he is awaiting to hear back about the UA checking for UTI that he requested yesterday, I explained that [facility] has started [employer] and are awaiting approval, she would not be discharged before getting approval, He stated that if [facility] can offer a bed he would like to change it to there, Awaiting a response from [employer] [facility] Services                                                 Social Determinants of Health ([place]) Interventions    Readmission Risk Interventions No flowsheet data found.

## 2020-10-18 NOTE — Progress Notes (Signed)
Inpatient Diabetes Program Recommendations  AACE/ADA: New Consensus Statement on Inpatient Glycemic Control  Target Ranges:  Prepandial:   less than 140 mg/dL      Peak postprandial:   less than 180 mg/dL (1-2 hours)      Critically ill patients:  140 - 180 mg/dL  Results for Jodi Reeves, Jodi Reeves (MRN [id number]) as of 10/18/2020 08:09  Ref. Range 10/18/2020 05:31  Glucose Latest Ref Range: 70 - 99 mg/dL 148 (H)   Results for Jodi Reeves, Jodi Reeves (MRN [id number]) as of 10/18/2020 08:09  Ref. Range 10/17/2020 07:45 10/17/2020 11:52 10/17/2020 16:55 10/17/2020 17:55 10/17/2020 21:21  Glucose-Capillary Latest Ref Range: 70 - 99 mg/dL 155 (H) 137 (H) 68 (L) 114 (H) 190 (H)    Review of Glycemic Control  Diabetes history: DM2 Outpatient Diabetes medications: Metformin XR 500 mg BID, Tradjenta 5 mg daily Current orders for Inpatient glycemic control: Lantus 10 units daily, Novolog 0-15 units AC&HS, Metformin XR 500 mg BID, Tradjenta 5 mg daily   Inpatient Diabetes Program Recommendations:    Insulin: Please consider decreasing Novolog correction to 0-9 units AC&HS.  Thanks, Barnie Alderman, RN, MSN, CDE Diabetes Coordinator Inpatient Diabetes Program [phone] (Team Pager from 8am to 5pm)

## 2020-10-18 NOTE — Progress Notes (Signed)
Subjective: Patient seen at 1:30 PM today POD #6 s/p IM fixation for left IT hip fracture.   Non-op management for left radial head fracture.   Patient reports left hip and elbow pain as mild.    Objective:   VITALS:   Vitals:   10/18/20 0842 10/18/20 1131 10/18/20 1549 10/18/20 2001  BP: 140/71 119/74 109/66 112/61  Pulse: 80 79 64 88  Resp: 16 16 16 17   Temp: 97.9 F (36.6 C) 97.8 F (36.6 C) 98.3 F (36.8 C) 98 F (36.7 C)  TempSrc: Oral Oral  Oral  SpO2: 93% 95% 98% 96%  Weight:      Height:        PHYSICAL EXAM: Left lower extremity: Neurovascular intact Sensation intact distally Intact pulses distally Dorsiflexion/Plantar flexion intact Incision: moderate serous drainage No cellulitis present Compartment soft  Left upper extremity: Sling in place.  Bandage in place.   NVI.  LABS  Results for orders placed or performed during the hospital encounter of 10/10/20 (from the past 24 hour(s))  CBC     Status: Abnormal   Collection Time: 10/18/20  5:31 AM  Result Value Ref Range   WBC 7.5 4.0 - 10.5 K/uL   RBC 2.69 (L) 3.87 - 5.11 MIL/uL   Hemoglobin 8.9 (L) 12.0 - 15.0 g/dL   HCT 27.3 (L) 36.0 - 46.0 %   MCV 101.5 (H) 80.0 - 100.0 fL   MCH 33.1 26.0 - 34.0 pg   MCHC 32.6 30.0 - 36.0 g/dL   RDW 15.8 (H) 11.5 - 15.5 %   Platelets 204 150 - 400 K/uL   nRBC 0.0 0.0 - 0.2 %  Basic metabolic panel     Status: Abnormal   Collection Time: 10/18/20  5:31 AM  Result Value Ref Range   Sodium 136 135 - 145 mmol/L   Potassium 4.6 3.5 - 5.1 mmol/L   Chloride 102 98 - 111 mmol/L   CO2 28 22 - 32 mmol/L   Glucose, Bld 148 (H) 70 - 99 mg/dL   BUN 27 (H) 8 - 23 mg/dL   Creatinine, Ser 0.59 0.44 - 1.00 mg/dL   Calcium 7.9 (L) 8.9 - 10.3 mg/dL   GFR, Estimated >60 >60 mL/min   Anion gap 6 5 - 15  Glucose, capillary     Status: Abnormal   Collection Time: 10/18/20  8:08 AM  Result Value Ref Range   Glucose-Capillary 155 (H) 70 - 99 mg/dL  Glucose, capillary      Status: Abnormal   Collection Time: 10/18/20 11:33 AM  Result Value Ref Range   Glucose-Capillary 211 (H) 70 - 99 mg/dL  Glucose, capillary     Status: None   Collection Time: 10/18/20  4:02 PM  Result Value Ref Range   Glucose-Capillary 80 70 - 99 mg/dL    No results found.  Assessment/Plan: 6 Days Post-Op   Active Problems:   Hypothyroidism   Essential hypertension   Permanent atrial fibrillation (HCC)   Chronic diastolic CHF (congestive heart failure) (HCC)   Type 2 diabetes mellitus with diabetic polyneuropathy, without long-term current use of insulin (HCC)   Closed left hip fracture (HCC)   Acute blood loss anemia   Thrombocytopenia (HCC)   Closed intertrochanteric fracture of hip, left, initial encounter [facility])   Patient progressing from an orthopaedic standpoint.  Continue PT.  D/C lovenox due to drainage from incision.   Change to aspirin 325 mg PO BID.  Will get a provena dressing for  her incision tomorrow.   Thornton [place] , MD 10/18/2020, 10:04 PM

## 2020-10-18 NOTE — Progress Notes (Signed)
Physical Therapy Treatment Patient Details Name: Jodi Reeves MRN: [id number] DOB: [birth year] Today's Date: 10/18/2020    History of Present Illness Pt is a 87 y.o. female s/p fall with a diagnosis of non-surgical L radial head fx and L hip fx s/p IM nail. PMH includes balance disorder, HTN, AF, gait unsteadiness, DM2, PND, CAD, PAD, UTI, kyphoplasty.    PT Comments    Pt resting in bed upon PT arrival; agreeable to PT session with some encouragement.  1-2/10 L hip pain at rest beginning of session and 4-5/10 end of session at rest.  D/t pt's concerns of pain, 2 assist provided with bed mobility utilizing bed pad to assist.  SBA to min assist for sitting balance d/t occasional posterior lean.  Mod assist x2 (x2 trials from mildly elevated bed) to stand up to L platform RW; pt requiring assist for balance in standing d/t posterior lean.  Pt participatory with activities but fatigued so pt assisted back to bed and pt reporting nausea.  Pt given emesis bag and cold wash cloth to forehead and pt reporting symptoms improved but not resolved (pt declined nausea medication though)--nurse notified.  Pt reporting no further needs end of session with call light and phone in reach.  Will continue to focus on strengthening and progressive functional mobility per pt tolerance.   Follow Up Recommendations  SNF     Equipment Recommendations  Other (comment) (TBD at next facility)    Recommendations for Other Services       Precautions / Restrictions Precautions Precautions: Fall Precaution Comments: High fall risk Required Braces or Orthoses: Sling Restrictions Weight Bearing Restrictions: Yes LUE Weight Bearing: Weight bearing as tolerated LUE Partial Weight Bearing Percentage or Pounds: WBAT with PFRW only LLE Weight Bearing: Partial weight bearing LLE Partial Weight Bearing Percentage or Pounds: 50 Other Position/Activity Restrictions: Per Dr. Mack Guise pt may WBAT throught the LUE with a  platform walker, may have sling off with therapy.    Mobility  Bed Mobility Overal bed mobility: Needs Assistance Bed Mobility: Supine to Sit;Sit to Supine     Supine to sit: +2 for physical assistance Sit to supine: +2 for physical assistance   General bed mobility comments: assist for trunk and B LE's; use of bed sheet to decrease pain with bed mobility    Transfers Overall transfer level: Needs assistance Equipment used: Left platform walker Transfers: Sit to/from Stand Sit to Stand: Mod assist;+2 physical assistance         General transfer comment: x2 trials standing from mildly elevated bed; vc's and tactile cues for L UE placement and WB'ing precautions; assist to control descent sitting; vc's for technique  Ambulation/Gait             General Gait Details: deferred d/t pt requiring vc's and assist for upright posture   Stairs             Wheelchair Mobility    Modified Rankin (Stroke Patients Only)       Balance Overall balance assessment: Needs assistance Sitting-balance support: Single extremity supported;Feet supported Sitting balance-Leahy Scale: Fair Sitting balance - Comments: occasional min assist d/t posterior lean Postural control: Posterior lean Standing balance support: Bilateral upper extremity supported Standing balance-Leahy Scale: Poor Standing balance comment: intermittent vc's for shifting weight forward and for upright posture                            Cognition Arousal/Alertness: Awake/alert  Behavior During Therapy: WFL for tasks assessed/performed Overall Cognitive Status: Within Functional Limits for tasks assessed                                 General Comments: Increased time for processing noted; alert      Exercises Total Joint Exercises Ankle Circles/Pumps: AROM;Strengthening;Both;10 reps;Supine Quad Sets: AROM;Strengthening;Both;10 reps;Supine Heel Slides:  AAROM;Strengthening;Left;10 reps;Supine Hip ABduction/ADduction: AAROM;Strengthening;Left;10 reps;Supine    General Comments General comments (skin integrity, edema, etc.): yellow drainage noted from L hip dressing (nurse notified).  Nursing cleared pt for participation in physical therapy.  Pt agreeable to PT session.      Pertinent Vitals/Pain Pain Assessment: 0-10 Pain Score: 5  Pain Location: L hip Pain Descriptors / Indicators: Aching;Sore;Grimacing Pain Intervention(s): Limited activity within patient's tolerance;Monitored during session;Repositioned;Other (comment) (RN notified) Vitals (HR and O2 on room air) stable and WFL throughout treatment session.    Home Living                      Prior Function            PT Goals (current goals can now be found in the care plan section) Acute Rehab PT Goals Patient Stated Goal: To go to rehab. PT Goal Formulation: With patient Time For Goal Achievement: 10/26/20 Potential to Achieve Goals: Fair Progress towards PT goals: Progressing toward goals    Frequency    7X/week      PT Plan Current plan remains appropriate    Co-evaluation              AM-PAC PT "6 Clicks" Mobility   Outcome Measure  Help needed turning from your back to your side while in a flat bed without using bedrails?: A Lot Help needed moving from lying on your back to sitting on the side of a flat bed without using bedrails?: A Lot Help needed moving to and from a bed to a chair (including a wheelchair)?: Total Help needed standing up from a chair using your arms (e.g., wheelchair or bedside chair)?: Total Help needed to walk in hospital room?: Total Help needed climbing 3-5 steps with a railing? : Total 6 Click Score: 8    End of Session Equipment Utilized During Treatment: Gait belt Activity Tolerance: Patient limited by pain Patient left: in bed;with call bell/phone within reach;with bed alarm set;Other (comment);with SCD's  reapplied (B heels floating via pillow support) Nurse Communication: Mobility status;Weight bearing status;Precautions;Other (comment) (pt's pain status) PT Visit Diagnosis: Unsteadiness on feet (R26.81);History of falling (Z91.81);Muscle weakness (generalized) (M62.81);Other abnormalities of gait and mobility (R26.89);Pain Pain - Right/Left: Left Pain - part of body: Hip     Time: 9767-3419 PT Time Calculation (min) (ACUTE ONLY): 55 min  Charges:  $Therapeutic Exercise: 8-22 mins $Therapeutic Activity: 38-52 mins                     Leitha Bleak, PT 10/18/20, 4:26 PM

## 2020-10-18 NOTE — Care Management Important Message (Signed)
Important Message  Patient Details  Name: Jodi Reeves MRN: [id number] Date of Birth: 01/15/1928   Medicare Important Message Given:  Yes     Dannette Barbara 10/18/2020, 11:34 AM

## 2020-10-19 ENCOUNTER — Encounter: Payer: Self-pay | Admitting: Orthopedic Surgery

## 2020-10-19 LAB — GLUCOSE, CAPILLARY
Glucose-Capillary: 120 mg/dL — ABNORMAL HIGH (ref 70–99)
Glucose-Capillary: 148 mg/dL — ABNORMAL HIGH (ref 70–99)
Glucose-Capillary: 205 mg/dL — ABNORMAL HIGH (ref 70–99)
Glucose-Capillary: 111 mg/dL — ABNORMAL HIGH (ref 70–99)

## 2020-10-19 LAB — URINE CULTURE

## 2020-10-19 NOTE — Progress Notes (Signed)
PROGRESS NOTE    Jodi Reeves  RWE:[id number] DOB: January 22, 1928 DOA: 10/10/2020 PCP: [name], Nino Glow, MD   Chief complaint.  Hip pain. Brief Narrative:  Jodi Reeves is a 87 y.o. female with medical history significant for multiple medical problems that are mentioned below, including atrial fibrillation with anticoagulation with Xarelto, who presented to the emergency room with acute onset of left hip pain.  The patient was turning off the light switch when she stepped back and lost her balance and fell on the floor.  She sustained left hip fracture.  She has been seen by Dr. Mack Guise, planning for hip surgery 6/21.   Assessment & Plan:   Active Problems:   Hypothyroidism   Essential hypertension   Permanent atrial fibrillation (HCC)   Chronic diastolic CHF (congestive heart failure) (HCC)   Type 2 diabetes mellitus with diabetic polyneuropathy, without long-term current use of insulin (HCC)   Closed left hip fracture (HCC)   Acute blood loss anemia   Thrombocytopenia (HCC)   Closed intertrochanteric fracture of hip, left, initial encounter ([place])  Left hip fracture. S/p operative repair 6/21.  - pt/ot consulted, plan for SNF, SW working on bed, insurance auth pending - morphine/hydrocodone/tramadol for pain control, miralax/senna/docusate - given serous drainage ortho advises asa 325 bid for dvt ppx in lieu of lovenox  Delirium Mild, stable. Pain appears adequately controlled - delirium precautions - son requests urinalysis. Ua not strongly suggestive of infection. Pt denies dysuria, frequency, suprapubic pain/tenderness. Will f/u culture, holding on treating  Hyperkalemia Resolved w/ holding home K supplement  Acute blood loss anemia 2/2 hip fracture. Has received 2 units prbcs, last unit was 6/23. Hgb stable in 8s since then - monitor  Chronic diastolic congestive heart failure. Permanent atrial fibrillation. Patient had episode of rapid ventricular  response that is improved. Cardiology following - in light of frequent falls cardiology advises holding anticoagulation for now - cont home bisoprolol and lasix - outpt cardiology f/u  Oxygen requirement Now weaned off O2 - monitor  Macrocytosis 12/folate wnl  Type 2 diabetes. Continue sliding scale insulin as well as home tradjenta. Pt refuses metformin. Continuing lantus    DVT prophylaxis: lovenox Code Status: full Family Communication: updated son telephonically 6/28 Disposition Plan: SNF, pending insurance authorization  Status is: Inpatient  Remains inpatient appropriate because:Inpatient level of care appropriate due to severity of illness  Dispo: The patient is from: Home              Anticipated d/c is to: SNF              Patient currently is not medically stable to d/c.   Difficult to place patient No     I/O last 3 completed shifts: In: 717 [P.O.:717] Out: 2400 [Urine:2400] No intake/output data recorded.     Consultants:  Orthopedics, cardiology  Procedures: Hip today  Antimicrobials: None  Subjective: Complains of pain with movement. Breathing stable. Tolerating diet. Pain improved some. No dysuria  Objective: Vitals:   10/18/20 2001 10/19/20 0515 10/19/20 0736 10/19/20 1123  BP: 112/61 119/67 118/63 (!) 96/56  Pulse: 88 88 98 78  Resp: 17 17 16 15   Temp: 98 F (36.7 C) 97.7 F (36.5 C) 98.3 F (36.8 C) 98.1 F (36.7 C)  TempSrc: Oral Oral Oral   SpO2: 96% 97% 97% 96%  Weight:      Height:        Intake/Output Summary (Last 24 hours) at 10/19/2020 1229 Last data filed  at 10/19/2020 0517 Gross per 24 hour  Intake 477 ml  Output 1150 ml  Net -673 ml   Filed Weights   10/10/20 0230 10/12/20 1429  Weight: 62.2 kg 62.2 kg    Examination:  General exam: Appears calm and comfortable  Respiratory system: Clear to auscultation. Respiratory effort normal. Cardiovascular system: Irregularly irregular, mildly tachycardic.  No JVD,  murmurs, rubs, gallops or clicks.  Gastrointestinal system: Abdomen is nondistended, soft and nontender. No organomegaly or masses felt. Normal bowel sounds heard. Central nervous system: Alert and oriented x3. No focal neurological deficits. Extremities: trace LE edema. : hip bandaged Skin: bandage left lateral hip serous drainage Psychiatry: confused    Data Reviewed: I have personally reviewed following labs and imaging studies  CBC: Recent Labs  Lab 10/13/20 0521 10/14/20 0440 10/15/20 0443 10/16/20 0433 10/17/20 0407 10/18/20 0531  WBC 9.7 10.1 9.3 7.2 8.4 7.5  NEUTROABS 8.2*  --   --   --   --   --   HGB 8.6* 7.0* 8.9* 8.8* 8.6* 8.9*  HCT 26.2* 21.3* 26.5* 26.8* 26.3* 27.3*  MCV 104.0* 101.9* 101.1* 100.8* 104.0* 101.5*  PLT 145* 163 167 177 186 528   Basic Metabolic Panel: Recent Labs  Lab 10/13/20 0521 10/13/20 1917 10/14/20 0440 10/15/20 0443 10/16/20 0433 10/17/20 0407 10/18/20 0531  NA 138   < > 137 138 138 136 136  K 4.0   < > 4.4 4.2 4.7 5.3* 4.6  CL 107   < > 106 107 108 106 102  CO2 24   < > 26 26 27 27 28   GLUCOSE 186*   < > 176* 132* 170* 151* 148*  BUN 23   < > 33* 25* 28* 35* 27*  CREATININE 0.71   < > 0.65 0.54 0.50 0.71 0.59  CALCIUM 7.3*   < > 7.4* 7.3* 7.8* 8.0* 7.9*  MG 2.1  --   --   --   --   --   --    < > = values in this interval not displayed.   GFR: Estimated Creatinine Clearance: 37.9 mL/min (by C-G formula based on SCr of 0.59 mg/dL). Liver Function Tests: No results for input(s): AST, ALT, ALKPHOS, BILITOT, PROT, ALBUMIN in the last 168 hours.  No results for input(s): LIPASE, AMYLASE in the last 168 hours. No results for input(s): AMMONIA in the last 168 hours. Coagulation Profile: No results for input(s): INR, PROTIME in the last 168 hours.  Cardiac Enzymes: No results for input(s): CKTOTAL, CKMB, CKMBINDEX, TROPONINI in the last 168 hours. BNP (last 3 results) No results for input(s): PROBNP in the last 8760  hours. HbA1C: No results for input(s): HGBA1C in the last 72 hours.  CBG: Recent Labs  Lab 10/18/20 1133 10/18/20 1602 10/18/20 2245 10/19/20 0737 10/19/20 1146  GLUCAP 211* 80 172* 120* 148*   Lipid Profile: No results for input(s): CHOL, HDL, LDLCALC, TRIG, CHOLHDL, LDLDIRECT in the last 72 hours. Thyroid Function Tests: No results for input(s): TSH, T4TOTAL, FREET4, T3FREE, THYROIDAB in the last 72 hours. Anemia Panel: No results for input(s): VITAMINB12, FOLATE, FERRITIN, TIBC, IRON, RETICCTPCT in the last 72 hours.  Sepsis Labs: No results for input(s): PROCALCITON, LATICACIDVEN in the last 168 hours.  Recent Results (from the past 240 hour(s))  Resp Panel by RT-PCR (Flu A&B, Covid) Nasopharyngeal Swab     Status: None   Collection Time: 10/10/20  3:21 AM   Specimen: Nasopharyngeal Swab; Nasopharyngeal(NP) swabs in vial transport  medium  Result Value Ref Range Status   SARS Coronavirus 2 by RT PCR NEGATIVE NEGATIVE Final    Comment: (NOTE) SARS-CoV-2 target nucleic acids are NOT DETECTED.  The SARS-CoV-2 RNA is generally detectable in upper respiratory specimens during the acute phase of infection. The lowest concentration of SARS-CoV-2 viral copies this assay can detect is 138 copies/mL. A negative result does not preclude SARS-Cov-2 infection and should not be used as the sole basis for treatment or other patient management decisions. A negative result may occur with  improper specimen collection/handling, submission of specimen other than nasopharyngeal swab, presence of viral mutation(s) within the areas targeted by this assay, and inadequate number of viral copies(<138 copies/mL). A negative result must be combined with clinical observations, patient history, and epidemiological information. The expected result is Negative.  Fact Sheet for Patients:  EntrepreneurPulse.com.au  Fact Sheet for Healthcare Providers:   IncredibleEmployment.be  This test is no t yet approved or cleared by the [place] FDA and  has been authorized for detection and/or diagnosis of SARS-CoV-2 by FDA under an Emergency Use Authorization (EUA). This EUA will remain  in effect (meaning this test can be used) for the duration of the COVID-19 declaration under Section 564(b)(1) of the Act, 21 U.S.C.section 360bbb-3(b)(1), unless the authorization is terminated  or revoked sooner.       Influenza A by PCR NEGATIVE NEGATIVE Final   Influenza B by PCR NEGATIVE NEGATIVE Final    Comment: (NOTE) The Xpert Xpress SARS-CoV-2/FLU/RSV plus assay is intended as an aid in the diagnosis of influenza from Nasopharyngeal swab specimens and should not be used as a sole basis for treatment. Nasal washings and aspirates are unacceptable for Xpert Xpress SARS-CoV-2/FLU/RSV testing.  Fact Sheet for Patients: EntrepreneurPulse.com.au  Fact Sheet for Healthcare Providers: IncredibleEmployment.be  This test is not yet approved or cleared by the [place] FDA and has been authorized for detection and/or diagnosis of SARS-CoV-2 by FDA under an Emergency Use Authorization (EUA). This EUA will remain in effect (meaning this test can be used) for the duration of the COVID-19 declaration under Section 564(b)(1) of the Act, 21 U.S.C. section 360bbb-3(b)(1), unless the authorization is terminated or revoked.  Performed at [facility], [address] [place], [place] [address]   Surgical PCR screen     Status: None   Collection Time: 10/10/20 11:20 PM   Specimen: Nasal Mucosa; Nasal Swab  Result Value Ref Range Status   MRSA, PCR NEGATIVE NEGATIVE Final   Staphylococcus aureus NEGATIVE NEGATIVE Final    Comment: (NOTE) The Xpert SA Assay (FDA approved for NASAL specimens in patients 53 years of age and older), is one component of a comprehensive surveillance  program. It is not intended to diagnose infection nor to guide or monitor treatment. Performed at [facility], [address] [place], [place] [address]          Radiology Studies: No results found.      Scheduled Meds:  acidophilus  1 capsule Oral Daily   ascorbic acid  2,000 mg Oral Daily   aspirin  325 mg Oral BID   bisoprolol  5 mg Oral BID   Chlorhexidine Gluconate Cloth  6 each Topical Daily   docusate sodium  100 mg Oral BID   vitamin B-12  1,000 mcg Oral Daily   And   folic acid  1 mg Oral Daily   And   vitamin B-6  50 mg Oral Daily   furosemide  20 mg Oral BID   insulin aspart  0-15 Units Subcutaneous TID AC & HS   insulin glargine  10 Units Subcutaneous Daily   levothyroxine  50 mcg Oral Q0600   linagliptin  5 mg Oral Daily   magnesium oxide  200 mg Oral Daily   metFORMIN  500 mg Oral BID AC   multivitamin with minerals   Oral Daily   omega-3 acid ethyl esters  1,000 mg Oral BID   polyethylene glycol  17 g Oral Daily   senna  1 tablet Oral BID   traMADol  50 mg Oral Q6H   Continuous Infusions:   LOS: 9 days    Time spent: 25 minutes    Desma Maxim, MD [facility]   To contact the attending provider between 7A-7P or the covering provider during after hours 7P-7A, please log into the web site [email or web address] and access using universal [place] password for that web site. If you do not have the password, please call the hospital operator.  10/19/2020, 12:29 PM

## 2020-10-19 NOTE — Plan of Care (Signed)

## 2020-10-19 NOTE — Progress Notes (Signed)
Physical Therapy Treatment Patient Details Name: Jodi Reeves MRN: [id number] DOB: 03/01/1928 Today's Date: 10/19/2020    History of Present Illness Pt is a 87 y.o. female s/p fall with a diagnosis of non-surgical L radial head fx and L hip fx s/p IM nail. PMH includes balance disorder, HTN, AF, gait unsteadiness, DM2, PND, CAD, PAD, UTI, kyphoplasty.    PT Comments    Pt resting in bed upon PT arrival; pt's brother present during session.  2 assist provided for bed mobility to improve pain control.  Minimal L hip pain at rest but 6-8/10 with movement/activity (pt received pain medication prior to session and beginning of session).  Pt able to stand up to L platform RW with mod assist x2 (bed height mildly elevated) and able to stand each trial for 2 minutes each with vc's focusing on shifting weight forward and upright posture.  End of session, pt assisted back to bed and pt reporting feeling hot so therapist placed cool wash cloth to pt's forehead and pt reporting improved comfort.  MD Mack Guise present for dressing change end of session.  Will continue to focus on strengthening and progressive functional mobility per pt tolerance.    Follow Up Recommendations  SNF     Equipment Recommendations  Other (comment) (TBD at next facility)    Recommendations for Other Services       Precautions / Restrictions Precautions Precautions: Fall Precaution Comments: High fall risk Required Braces or Orthoses: Sling Restrictions Weight Bearing Restrictions: Yes LUE Weight Bearing: Weight bearing as tolerated LUE Partial Weight Bearing Percentage or Pounds: WBAT through elbow with PFRW only LLE Weight Bearing: Partial weight bearing LLE Partial Weight Bearing Percentage or Pounds: 50 Other Position/Activity Restrictions: Per Dr. Mack Guise pt may WBAT throught the LUE with a platform walker, may have sling off with therapy.    Mobility  Bed Mobility Overal bed mobility: Needs  Assistance Bed Mobility: Supine to Sit;Sit to Supine     Supine to sit: +2 for physical assistance Sit to supine: +2 for physical assistance   General bed mobility comments: assist for trunk and B LE's; use of bed sheet and 2 assist to decrease pain with bed mobility    Transfers Overall transfer level: Needs assistance Equipment used: Left platform walker Transfers: Sit to/from Stand Sit to Stand: Mod assist;+2 physical assistance         General transfer comment: x2 trials standing from mildly elevated bed; vc's and tactile cues for L UE placement and WB'ing precautions; assist to control descent sitting; vc's for technique; able to stand for 2 minutes each attempt standing  Ambulation/Gait             General Gait Details: deferred d/t focus on upright posture in standing   Stairs             Wheelchair Mobility    Modified Rankin (Stroke Patients Only)       Balance Overall balance assessment: Needs assistance Sitting-balance support: Single extremity supported;Feet supported Sitting balance-Leahy Scale: Fair Sitting balance - Comments: occasional min assist d/t posterior lean Postural control: Posterior lean Standing balance support: Bilateral upper extremity supported (L platform RW) Standing balance-Leahy Scale: Poor Standing balance comment: intermittent vc's for shifting weight forward and for upright posture                            Cognition Arousal/Alertness: Awake/alert Behavior During Therapy: WFL for tasks assessed/performed Overall  Cognitive Status: Within Functional Limits for tasks assessed                                 General Comments: Increased time for processing noted; alert      Exercises Total Joint Exercises Ankle Circles/Pumps: AROM;Strengthening;Both;10 reps;Supine Quad Sets: AROM;Strengthening;Both;10 reps;Supine Heel Slides: AAROM;Strengthening;Left;10 reps;Supine Hip ABduction/ADduction:  AAROM;Strengthening;Left;10 reps;Supine    General Comments General comments (skin integrity, edema, etc.): yellow drainage noted from L hip dressing and on bed linen (Dr. Mack Guise present end of session and notified).  Pt agreeable to PT session.      Pertinent Vitals/Pain Pain Assessment: Faces Faces Pain Scale: Hurts a little bit (6-8/10 with movement) Pain Location: L hip Pain Descriptors / Indicators: Aching;Sore;Grimacing Pain Intervention(s): Limited activity within patient's tolerance;Monitored during session;Premedicated before session;Repositioned;RN gave pain meds during session    Home Living                      Prior Function            PT Goals (current goals can now be found in the care plan section) Acute Rehab PT Goals Patient Stated Goal: To go to rehab. PT Goal Formulation: With patient Time For Goal Achievement: 10/26/20 Potential to Achieve Goals: Fair Progress towards PT goals: Progressing toward goals    Frequency    7X/week      PT Plan Current plan remains appropriate    Co-evaluation              AM-PAC PT "6 Clicks" Mobility   Outcome Measure  Help needed turning from your back to your side while in a flat bed without using bedrails?: A Lot Help needed moving from lying on your back to sitting on the side of a flat bed without using bedrails?: A Lot Help needed moving to and from a bed to a chair (including a wheelchair)?: Total Help needed standing up from a chair using your arms (e.g., wheelchair or bedside chair)?: Total Help needed to walk in hospital room?: Total Help needed climbing 3-5 steps with a railing? : Total 6 Click Score: 8    End of Session Equipment Utilized During Treatment: Gait belt Activity Tolerance: Patient limited by fatigue Patient left: in bed;with call bell/phone within reach;with bed alarm set;with family/visitor present;with SCD's reapplied;Other (comment) (B heels floating via pillow  support) Nurse Communication: Mobility status;Weight bearing status;Precautions PT Visit Diagnosis: Unsteadiness on feet (R26.81);History of falling (Z91.81);Muscle weakness (generalized) (M62.81);Other abnormalities of gait and mobility (R26.89);Pain Pain - Right/Left: Left Pain - part of body: Hip     Time: 0321-2248 PT Time Calculation (min) (ACUTE ONLY): 41 min  Charges:  $Therapeutic Exercise: 8-22 mins $Therapeutic Activity: 23-37 mins                     Leitha Bleak, PT 10/19/20, 1:19 PM

## 2020-10-19 NOTE — Progress Notes (Signed)
  Subjective:  POD #7 s/p intramedullary fixation for left intertrochanteric hip fracture.  Patient is being treated nonoperatively for a left radial head fracture.  Patient seen today in her hospital room.  She has just completed physical therapy.  Her brother is at the bedside.   Patient reports left lower extremity and elbow pain as mild.  Patient has had persistent serous drainage from her left incision.  Lovenox was stopped yesterday and the patient was started on enteric-coated aspirin 325 mg p.o. twice daily for DVT prophylaxis.  Objective:   VITALS:   Vitals:   10/18/20 2001 10/19/20 0515 10/19/20 0736 10/19/20 1123  BP: 112/61 119/67 118/63 (!) 96/56  Pulse: 88 88 98 78  Resp: 17 17 16 15   Temp: 98 F (36.7 C) 97.7 F (36.5 C) 98.3 F (36.8 C) 98.1 F (36.7 C)  TempSrc: Oral Oral Oral   SpO2: 96% 97% 97% 96%  Weight:      Height:        PHYSICAL EXAM: Left lower extremity: Personally change the patient's dressings today.  There is no active drainage from her incisions.  The dressings have serous drainage.  A Prevena dressing was applied over the upper 2 incisions.  No signs of infection.  No erythema ecchymosis or fluctuance. Neurovascular intact Sensation intact distally Intact pulses distally Dorsiflexion/Plantar flexion intact Incision: no drainage No cellulitis present Compartment soft   Left elbow: Dressing in place no active drainage.  Neurovascular intact.   LABS  Results for orders placed or performed during the hospital encounter of 10/10/20 (from the past 24 hour(s))  Glucose, capillary     Status: None   Collection Time: 10/18/20  4:02 PM  Result Value Ref Range   Glucose-Capillary 80 70 - 99 mg/dL  Glucose, capillary     Status: Abnormal   Collection Time: 10/18/20 10:45 PM  Result Value Ref Range   Glucose-Capillary 172 (H) 70 - 99 mg/dL   Comment 1 Notify RN   Glucose, capillary     Status: Abnormal   Collection Time: 10/19/20  7:37 AM   Result Value Ref Range   Glucose-Capillary 120 (H) 70 - 99 mg/dL  Glucose, capillary     Status: Abnormal   Collection Time: 10/19/20 11:46 AM  Result Value Ref Range   Glucose-Capillary 148 (H) 70 - 99 mg/dL    No results found.  Assessment/Plan: 7 Days Post-Op   Active Problems:   Hypothyroidism   Essential hypertension   Permanent atrial fibrillation (HCC)   Chronic diastolic CHF (congestive heart failure) (HCC)   Type 2 diabetes mellitus with diabetic polyneuropathy, without long-term current use of insulin (HCC)   Closed left hip fracture (HCC)   Acute blood loss anemia   Thrombocytopenia (HCC)   Closed intertrochanteric fracture of hip, left, initial encounter [facility])  Patient progressing from an orthopedic standpoint.  Continue physical therapy.  Prevena dressing applied to help reduce serous drainage.  Continue enteric-coated aspirin 3 2 5  mg p.o. twice daily for DVT prophylaxis until follow-up with Dr. Joen Aribella in 2 weeks in the office.    Thornton Park , MD 10/19/2020, 12:39 PM

## 2020-10-19 NOTE — Progress Notes (Signed)
Occupational Therapy Treatment Patient Details Name: Jodi Reeves MRN: [id number] DOB: October 22, 1927 Today's Date: 10/19/2020    History of present illness Pt is a 87 y.o. female s/p fall with a diagnosis of non-surgical L radial head fx and L hip fx s/p IM nail. PMH includes balance disorder, HTN, AF, gait unsteadiness, DM2, PND, CAD, PAD, UTI, kyphoplasty.   OT comments  Pt seen for OT tx. Pt sleeping, easily wakes to OT, agreeable to session. Pt endorses minimal pain at rest, increasing significantly with any mobility. Pt required MAX A x1 for sup<>sit, initial poor sitting balance as pt attempted to minimize L hip pain, causing a R lateral and posterior lean requiring MIN A to correct. With time, verbal cues, and tactile cues, pt able to maintain sitting balance, at times propping on R elbow while performing grooming tasks with set up. Pt appreciative of opportunity to wash her face and brush her teeth. Endorses mild nausea throughout, not worsening with positional changes. Pt required TOTAL A to scoot up in bed after return to supine. Pillows and bed adjusted to maximize comfort. Pt required SET UP for breakfast tray items to limit L hand/wrist use. Pt progressing slowly but surely towards goals, continues to benefit from skilled OT services to maximize return to PLOF and minimize further functional decline and increased caregiver burden.    Follow Up Recommendations  SNF;Supervision/Assistance - 24 hour    Equipment Recommendations  3 in 1 bedside commode    Recommendations for Other Services      Precautions / Restrictions Precautions Precautions: Fall Precaution Comments: High fall risk Required Braces or Orthoses: Sling Restrictions Weight Bearing Restrictions: Yes LUE Weight Bearing: Weight bearing as tolerated LUE Partial Weight Bearing Percentage or Pounds: WBAT through elbow with PFRW only LLE Weight Bearing: Partial weight bearing LLE Partial Weight Bearing Percentage or  Pounds: 50 Other Position/Activity Restrictions: Per Dr. Mack Guise pt may WBAT throught the LUE with a platform walker, may have sling off with therapy.       Mobility Bed Mobility Overal bed mobility: Needs Assistance Bed Mobility: Supine to Sit;Sit to Supine     Supine to sit: Max assist;HOB elevated Sit to supine: Max assist   General bed mobility comments: assist for trunk and B LE's; use of bed sheet to decrease pain with bed mobility    Transfers                      Balance Overall balance assessment: Needs assistance Sitting-balance support: Single extremity supported;Feet supported Sitting balance-Leahy Scale: Fair Sitting balance - Comments: initial posterior lean requiring MIN A to correct, improving with time to S-CGA and pt using R lateral lean to minimize L hip pain Postural control: Posterior lean;Right lateral lean                                 ADL either performed or assessed with clinical judgement   ADL Overall ADL's : Needs assistance/impaired Eating/Feeding: Bed level;Set up   Grooming: Sitting;Wash/dry face;Oral care;Set up;Min guard Grooming Details (indicate cue type and reason): CGA for sitting balance, pt very pain limited, leans to R side to shift weight off L hip and tends to lean posteriorly requiring VC and TC intermittently to correct  Vision       Perception     Praxis      Cognition Arousal/Alertness: Awake/alert Behavior During Therapy: WFL for tasks assessed/performed Overall Cognitive Status: Within Functional Limits for tasks assessed                                          Exercises Other Exercises Other Exercises: Pt tolerated sitting EOB with S-CGA-MIN A for static sitting balance, set up for grooming tasks, pain limited causing her to lean to R and posteriorly but improving with time Other Exercises: set up for breakfast tray  items   Shoulder Instructions       General Comments      Pertinent Vitals/ Pain       Pain Assessment: Faces Faces Pain Scale: Hurts whole lot Pain Location: L hip with movement Pain Descriptors / Indicators: Aching;Sore;Grimacing Pain Intervention(s): Limited activity within patient's tolerance;Monitored during session;Repositioned;Premedicated before session  Home Living                                          Prior Functioning/Environment              Frequency  Min 2X/week        Progress Toward Goals  OT Goals(current goals can now be found in the care plan section)  Progress towards OT goals: Progressing toward goals  Acute Rehab OT Goals Patient Stated Goal: To go to rehab. OT Goal Formulation: With patient Time For Goal Achievement: 10/27/20 Potential to Achieve Goals: Good  Plan Discharge plan remains appropriate;Frequency remains appropriate    Co-evaluation                 AM-PAC OT "6 Clicks" Daily Activity     Outcome Measure   Help from another person eating meals?: A Little Help from another person taking care of personal grooming?: A Little Help from another person toileting, which includes using toliet, bedpan, or urinal?: A Lot Help from another person bathing (including washing, rinsing, drying)?: A Lot Help from another person to put on and taking off regular upper body clothing?: A Lot Help from another person to put on and taking off regular lower body clothing?: A Lot 6 Click Score: 14    End of Session    OT Visit Diagnosis: Other abnormalities of gait and mobility (R26.89);Pain;History of falling (Z91.81) Pain - Right/Left: Left Pain - part of body: Arm;Hip;Knee   Activity Tolerance Patient limited by pain   Patient Left in bed;with call bell/phone within reach;with bed alarm set;with SCD's reapplied   Nurse Communication          Time: 8280-0349 OT Time Calculation (min): 33 min  Charges:  OT General Charges $OT Visit: 1 Visit OT Treatments $Self Care/Home Management : 23-37 mins  Hanley Hays, MPH, MS, OTR/L ascom [phone] 10/19/20, 9:45 AM

## 2020-10-20 LAB — RESP PANEL BY RT-PCR (FLU A&B, COVID) ARPGX2
Influenza A by PCR: NEGATIVE
Influenza B by PCR: NEGATIVE
SARS Coronavirus 2 by RT PCR: NEGATIVE

## 2020-10-20 LAB — CBC
HCT: 28.6 % — ABNORMAL LOW (ref 36.0–46.0)
Hemoglobin: 9.3 g/dL — ABNORMAL LOW (ref 12.0–15.0)
MCH: 33.1 pg (ref 26.0–34.0)
MCHC: 32.5 g/dL (ref 30.0–36.0)
MCV: 101.8 fL — ABNORMAL HIGH (ref 80.0–100.0)
Platelets: 233 10*3/uL (ref 150–400)
RBC: 2.81 MIL/uL — ABNORMAL LOW (ref 3.87–5.11)
RDW: 15.8 % — ABNORMAL HIGH (ref 11.5–15.5)
WBC: 6.9 10*3/uL (ref 4.0–10.5)
nRBC: 0 % (ref 0.0–0.2)

## 2020-10-20 LAB — BASIC METABOLIC PANEL
Anion gap: 8 (ref 5–15)
BUN: 21 mg/dL (ref 8–23)
CO2: 28 mmol/L (ref 22–32)
Calcium: 8.3 mg/dL — ABNORMAL LOW (ref 8.9–10.3)
Chloride: 101 mmol/L (ref 98–111)
Creatinine, Ser: 0.69 mg/dL (ref 0.44–1.00)
GFR, Estimated: >60 mL/min (ref >60)
Glucose, Bld: 117 mg/dL — ABNORMAL HIGH (ref 70–99)
Potassium: 4.8 mmol/L (ref 3.5–5.1)
Sodium: 137 mmol/L (ref 135–145)

## 2020-10-20 LAB — GLUCOSE, CAPILLARY
Glucose-Capillary: 108 mg/dL — ABNORMAL HIGH (ref 70–99)
Glucose-Capillary: 139 mg/dL — ABNORMAL HIGH (ref 70–99)
Glucose-Capillary: 94 mg/dL (ref 70–99)

## 2020-10-20 MED ORDER — HYDROCODONE-ACETAMINOPHEN 5-325 MG PO TABS: 1.0000 | ORAL_TABLET | Freq: Four times a day (QID) | ORAL | 0 refills | Status: AC | PRN

## 2020-10-20 MED ORDER — CYANOCOBALAMIN 1000 MCG PO TABS: 1000.0000 ug | ORAL_TABLET | Freq: Every day | ORAL | Status: DC

## 2020-10-20 MED ORDER — INSULIN GLARGINE 100 UNIT/ML ~~LOC~~ SOLN: 10.0000 [IU] | Freq: Every day | SUBCUTANEOUS | 11 refills | Status: DC

## 2020-10-20 MED ORDER — INSULIN ASPART 100 UNIT/ML IJ SOLN: 0.0000 [IU] | Freq: Three times a day (TID) | INTRAMUSCULAR | 11 refills | Status: DC

## 2020-10-20 MED ORDER — TRAMADOL HCL 50 MG PO TABS: 50.0000 mg | ORAL_TABLET | Freq: Four times a day (QID) | ORAL | 0 refills | Status: AC

## 2020-10-20 MED ORDER — SENNA 8.6 MG PO TABS: 1.0000 | ORAL_TABLET | Freq: Two times a day (BID) | ORAL | 0 refills | Status: DC

## 2020-10-20 MED ORDER — ONDANSETRON HCL 4 MG PO TABS: 4.0000 mg | ORAL_TABLET | Freq: Four times a day (QID) | ORAL | 0 refills | Status: DC | PRN

## 2020-10-20 MED ORDER — FOLIC ACID 1 MG PO TABS: 1.0000 mg | ORAL_TABLET | Freq: Every day | ORAL | Status: DC

## 2020-10-20 MED ORDER — PYRIDOXINE HCL 50 MG PO TABS: 50.0000 mg | ORAL_TABLET | Freq: Every day | ORAL | Status: DC

## 2020-10-20 MED ORDER — ALUM & MAG HYDROXIDE-SIMETH 200-200-20 MG/5ML PO SUSP: 30.0000 mL | ORAL | 0 refills | Status: DC | PRN

## 2020-10-20 MED ORDER — BISACODYL 10 MG RE SUPP
10.0000 mg | Freq: Once | RECTAL | Status: AC
  Administered 2020-10-20: 10 mg via RECTAL
  Filled 2020-10-20: qty 1

## 2020-10-20 MED ORDER — ASPIRIN 325 MG PO TABS: 325.0000 mg | ORAL_TABLET | Freq: Two times a day (BID) | ORAL | 0 refills | Status: AC

## 2020-10-20 MED ORDER — POLYETHYLENE GLYCOL 3350 17 G PO PACK: 17.0000 g | PACK | Freq: Every day | ORAL | 0 refills | Status: DC

## 2020-10-20 MED ORDER — MAGNESIUM HYDROXIDE 400 MG/5ML PO SUSP: 30.0000 mL | Freq: Every day | ORAL | 0 refills | Status: DC | PRN

## 2020-10-20 NOTE — Progress Notes (Signed)
Physical Therapy Treatment Patient Details Name: Jodi Reeves MRN: [id number] DOB: 20-Jul-1927 Today's Date: 10/20/2020    History of Present Illness Pt is a 87 y.o. female s/p fall with a diagnosis of non-surgical L radial head fx and L hip fx s/p IM nail. PMH includes balance disorder, HTN, AF, gait unsteadiness, DM2, PND, CAD, PAD, UTI, kyphoplasty.    PT Comments    Pt resting in bed upon PT arrival; agreeable to PT session.  Max assist semi-supine to sitting edge of bed.  Able to stand up to L platform RW x3 trials with mod assist x2 (bed height mildly elevated).  In standing, pt able to take a short step forward and then backwards x5 reps (x2 trials) with L LE only.  Vc's and assist still required for upright posture and balance in standing.  Vc's also required for [place] precautions.  2/10 L hip pain at rest beginning/end of session; 6/10 L hip pain with activity.  Pt resting in bed end of session with all needs in reach.  Will continue to focus on strengthening and progressive functional mobility per pt tolerance.   Follow Up Recommendations  SNF     Equipment Recommendations  Other (comment) (TBD at next facility)    Recommendations for Other Services       Precautions / Restrictions Precautions Precautions: Fall Precaution Comments: High fall risk Required Braces or Orthoses: Sling Restrictions Weight Bearing Restrictions: Yes LUE Weight Bearing: Weight bearing as tolerated LUE Partial Weight Bearing Percentage or Pounds: WBAT through elbow with PFRW only LLE Weight Bearing: Partial weight bearing LLE Partial Weight Bearing Percentage or Pounds: 50 Other Position/Activity Restrictions: Per Dr. Mack Guise pt may WBAT throught the LUE with a platform walker, may have sling off with therapy.    Mobility  Bed Mobility Overal bed mobility: Needs Assistance Bed Mobility: Supine to Sit;Sit to Supine     Supine to sit: Max assist;HOB elevated (assist for trunk and B  LE's) Sit to supine: +2 for physical assistance (2 assist provided for improved comfort)   General bed mobility comments: vc's for technique    Transfers Overall transfer level: Needs assistance Equipment used: Left platform walker Transfers: Sit to/from Stand Sit to Stand: Mod assist;+2 physical assistance         General transfer comment: x3 trials standing from mildly elevated bed; vc's and tactile cues for L UE placement and WB'ing precautions; assist to control descent sitting; vc's for overall technique  Ambulation/Gait Ambulation/Gait assistance: Min assist;+2 physical assistance Gait Distance (Feet):  (pt able to take a short step forward and then backwards x5 reps (x2 trials) with L LE only) Assistive device: Left platform walker       General Gait Details: vc's for technique; assist for upright posture   Stairs             Wheelchair Mobility    Modified Rankin (Stroke Patients Only)       Balance Overall balance assessment: Needs assistance Sitting-balance support: Single extremity supported;Feet supported Sitting balance-Leahy Scale: Fair Sitting balance - Comments: occasional CGA and vc's to correct d/t posterior lean Postural control: Posterior lean Standing balance support: Bilateral upper extremity supported (L platform RW) Standing balance-Leahy Scale: Poor Standing balance comment: intermittent vc's for shifting weight forward and for upright posture                            Cognition Arousal/Alertness: Awake/alert Behavior During Therapy:  WFL for tasks assessed/performed Overall Cognitive Status: Within Functional Limits for tasks assessed                                 General Comments: Increased time for processing noted; alert      Exercises      General Comments General comments (skin integrity, edema, etc.): L hip wound vac in place.  Nursing cleared pt for participation in physical therapy.  Pt  agreeable to PT session.      Pertinent Vitals/Pain Pain Assessment: Faces Faces Pain Scale: Hurts a little bit (2/10 at rest; 6/10 with activity) Pain Location: L hip Pain Descriptors / Indicators: Aching;Sore;Grimacing Pain Intervention(s): Limited activity within patient's tolerance;Monitored during session;Premedicated before session;Repositioned Vitals (HR and O2 on room air) stable and WFL throughout treatment session.    Home Living                      Prior Function            PT Goals (current goals can now be found in the care plan section) Acute Rehab PT Goals Patient Stated Goal: To go to rehab. PT Goal Formulation: With patient Time For Goal Achievement: 10/26/20 Potential to Achieve Goals: Fair Progress towards PT goals: Progressing toward goals    Frequency    7X/week      PT Plan Current plan remains appropriate    Co-evaluation              AM-PAC PT "6 Clicks" Mobility   Outcome Measure  Help needed turning from your back to your side while in a flat bed without using bedrails?: A Lot Help needed moving from lying on your back to sitting on the side of a flat bed without using bedrails?: A Lot Help needed moving to and from a bed to a chair (including a wheelchair)?: Total Help needed standing up from a chair using your arms (e.g., wheelchair or bedside chair)?: Total Help needed to walk in hospital room?: Total Help needed climbing 3-5 steps with a railing? : Total 6 Click Score: 8    End of Session Equipment Utilized During Treatment: Gait belt Activity Tolerance: Patient tolerated treatment well Patient left: in bed;with call bell/phone within reach;with bed alarm set;with SCD's reapplied;Other (comment) (B heels floating via pillow support; L UE supported on pillow) Nurse Communication: Mobility status;Weight bearing status;Precautions;Other (comment) PT Visit Diagnosis: Unsteadiness on feet (R26.81);History of falling  (Z91.81);Muscle weakness (generalized) (M62.81);Other abnormalities of gait and mobility (R26.89);Pain Pain - Right/Left: Left Pain - part of body: Hip     Time: 4854-6270 PT Time Calculation (min) (ACUTE ONLY): 38 min  Charges:  $Gait Training: 8-22 mins $Therapeutic Activity: 23-37 mins                    Leitha Bleak, PT 10/20/20, 1:17 PM

## 2020-10-20 NOTE — TOC Progression Note (Signed)
Transition of Care [facility]) - Progression Note    Patient Details  Name: Jodi Reeves MRN: [id number] Date of Birth: 03/10/1928  Transition of Care [facility]) CM/SW [place], RN Phone Number: 10/20/2020, 11:22 AM  Clinical Narrative:     Hulen Skains the Son Octavia Bruckner and left a VM notifying him of the plan to DC today, provided my contact information for a call back       Expected Discharge Plan and Services           Expected Discharge Date: 10/20/20                                     Social Determinants of Health (SDOH) Interventions    Readmission Risk Interventions No flowsheet data found.

## 2020-10-20 NOTE — Progress Notes (Signed)
Subjective:  POD #8 s/p medullary fixation for left intertrochanteric hip fracture.  Nonoperative treatment for left radial head fracture.   Patient reports left hip and elbow pain as .  minimal  Objective:   VITALS:   Vitals:   10/19/20 1942 10/20/20 0441 10/20/20 0758 10/20/20 1143  BP: (!) 102/54 (!) 105/52 106/64 (!) 101/57  Pulse: 66 81 75 75  Resp: 17 16 16 15   Temp: 97.8 F (36.6 C) 98.6 F (37 C) 98.1 F (36.7 C) 97.6 F (36.4 C)  TempSrc: Oral Oral Oral Oral  SpO2: 97% 94% 97% 96%  Weight:      Height:        PHYSICAL EXAM: Left lower extremity: Prevena dressing has maintained its suction.  There is no significant output in the collection container.  Patient has mild serous drainage on the gauze covering the inferior portion of the middle incision.  She remains neurovascular intact.   LABS  Results for orders placed or performed during the hospital encounter of 10/10/20 (from the past 24 hour(s))  Glucose, capillary     Status: Abnormal   Collection Time: 10/19/20  5:03 PM  Result Value Ref Range   Glucose-Capillary 205 (H) 70 - 99 mg/dL  Glucose, capillary     Status: Abnormal   Collection Time: 10/19/20  9:32 PM  Result Value Ref Range   Glucose-Capillary 111 (H) 70 - 99 mg/dL   Comment 1 Notify RN   Basic metabolic panel     Status: Abnormal   Collection Time: 10/20/20  4:30 AM  Result Value Ref Range   Sodium 137 135 - 145 mmol/L   Potassium 4.8 3.5 - 5.1 mmol/L   Chloride 101 98 - 111 mmol/L   CO2 28 22 - 32 mmol/L   Glucose, Bld 117 (H) 70 - 99 mg/dL   BUN 21 8 - 23 mg/dL   Creatinine, Ser 0.69 0.44 - 1.00 mg/dL   Calcium 8.3 (L) 8.9 - 10.3 mg/dL   GFR, Estimated >60 >60 mL/min   Anion gap 8 5 - 15  CBC     Status: Abnormal   Collection Time: 10/20/20  4:30 AM  Result Value Ref Range   WBC 6.9 4.0 - 10.5 K/uL   RBC 2.81 (L) 3.87 - 5.11 MIL/uL   Hemoglobin 9.3 (L) 12.0 - 15.0 g/dL   HCT 28.6 (L) 36.0 - 46.0 %   MCV 101.8 (H) 80.0 - 100.0 fL    MCH 33.1 26.0 - 34.0 pg   MCHC 32.5 30.0 - 36.0 g/dL   RDW 15.8 (H) 11.5 - 15.5 %   Platelets 233 150 - 400 K/uL   nRBC 0.0 0.0 - 0.2 %  Glucose, capillary     Status: Abnormal   Collection Time: 10/20/20  7:58 AM  Result Value Ref Range   Glucose-Capillary 108 (H) 70 - 99 mg/dL  Glucose, capillary     Status: Abnormal   Collection Time: 10/20/20 11:44 AM  Result Value Ref Range   Glucose-Capillary 139 (H) 70 - 99 mg/dL    No results found.  Assessment/Plan: 8 Days Post-Op   Active Problems:   Hypothyroidism   Essential hypertension   Permanent atrial fibrillation (HCC)   Chronic diastolic CHF (congestive heart failure) (HCC)   Type 2 diabetes mellitus with diabetic polyneuropathy, without long-term current use of insulin (HCC)   Closed left hip fracture (HCC)   Acute blood loss anemia   Thrombocytopenia (HCC)   Closed intertrochanteric fracture of  hip, left, initial encounter [facility])  Patient improving postop from an orthopedic standpoint.  Continue Prevena dressing.  Dressing will be discontinued at 7 days after application.  Continue physical therapy.  Patient will use a platform walker for her left elbow.  She is weightbearing as tolerated on the left lower extremity.  She will continue enteric-coated aspirin 325 mg twice daily for DVT prophylaxis.  She will follow-up with me in the office in 2 weeks after discharge.  Okay for discharge to a SNF from an orthopedic standpoint once cleared medically.    Thornton Park , MD 10/20/2020, 12:58 PM

## 2020-10-20 NOTE — Discharge Summary (Signed)
Physician Discharge Summary  Jodi Reeves ZDG:[id number] DOB: [birth year] DOA: 10/10/2020  PCP: [name], Nino Glow, MD  Admit date: 10/10/2020 Discharge date: 10/20/2020  Admitted From: home Disposition:  SNF  Recommendations for Outpatient Follow-up:  Follow up with PCP in 1-2 weeks Please obtain BMP/CBC in one week Please follow up with orthopedic surgeon in 2 weeks   Home Health: no  Equipment/Devices: none   Discharge Condition: Stable  CODE STATUS: Full  Diet recommendation: Regular     Discharge Diagnoses: Active Problems:   Hypothyroidism   Essential hypertension   Permanent atrial fibrillation (HCC)   Chronic diastolic CHF (congestive heart failure) ([facility])   Type 2 diabetes mellitus with diabetic polyneuropathy, without long-term current use of insulin (HCC)   Closed left hip fracture (HCC)   Acute blood loss anemia   Thrombocytopenia (HCC)   Closed intertrochanteric fracture of hip, left, initial encounter [facility])    Summary of HPI and Hospital Course:   Jodi Reeves is a 87 y.o. female with medical history significant for multiple medical problems that are mentioned below, including atrial fibrillation with anticoagulation with Xarelto, who presented to the emergency room with acute onset of left hip pain.  The patient was turning off the light switch when she stepped back and lost her balance and fell on the floor.  She sustained left hip fracture.  She has been seen by Dr. Mack Guise, planning for hip surgery 6/21.    Left hip fracture. S/p operative repair 6/21. - PT and OT recommenced SNF for rehab at d/c. - SW consulted --[facility] bed available and insurance approved Patient clinically improved, pain controlled.  Stable for discharge to rehab today. - Pain control: hydrocodone/tramadol for pain control - Bowel regimen: miralax/senna/docusate  -- last BM on 6/28 - Aspirin 325 BID for DVT prophylaxis  - Follow up with orthopedic surgeon,  Dr. Mack Guise, in 2 weeks - Monitor incision site for drainage or other issues    Delirium - resolved Mild, stable. Pain appears adequately controlled.  Related to hospital environment. No evidence of infection at this time. - delirium precautions - UA not suggestive of infection and patient is asymptomatic without dysuria, frequency, suprapubic pain/tenderness. No antibiotics indicated or started.  Monitor clinically.    Hyperkalemia Resolved w/ holding home K supplement    Acute blood loss anemia 2/2 hip fracture.  Received transfusion of 2 units packed RBC's, last on 6/23.  Hgb stable in 8s since then. - Recheck CBC in 1 week.   Chronic diastolic congestive heart failure. Permanent atrial fibrillation. Patient had episode of rapid ventricular response that is improved.  Cardiology consulted. - in light of frequent falls cardiology advises holding anticoagulation for now - cont home bisoprolol and lasix - outpt cardiology f/u   Oxygen requirement in post-op setting. Now weaned off O2.  Monitor,   Macrocytosis vitamin B12 and folate within normal limits   Type 2 diabetes. Continue sliding scale insulin as well as home tradjenta.  Pt declines to take metformin.  Continuing lantus Monitor blood sugars and adjust insulin as needed.     Discharge Instructions   Discharge Instructions     Call MD for:  extreme fatigue   Complete by: As directed    Call MD for:  persistant dizziness or light-headedness   Complete by: As directed    Call MD for:  persistant nausea and vomiting   Complete by: As directed    Call MD for:  redness, tenderness, or  signs of infection (pain, swelling, redness, odor or green/yellow discharge around incision site)   Complete by: As directed    Call MD for:  severe uncontrolled pain   Complete by: As directed    Call MD for:  temperature >100.4   Complete by: As directed    Diet - low sodium heart healthy   Complete by: As directed     Discharge instructions   Complete by: As directed    For Pain Control -  - tramadol is scheduled every 6 hours - would continue this for the next 1-2 weeks, but if pain is well controlled as she gets farther out from the fracture and surgery, it is okay to try use of Tylenol instead of tramadol and see if pain can be controlled that way. - Norco is available if she has pain despite use of tramadol - be aware patient reports this can make her "loopy", so try to avoid if possible.  Use only if needed.  She has required it only sporadically in the hospital.  Please be sure to continue bowel regimen, but hold stool softeners if having loose of frequent BM's.  Take full dose aspirin 325 mg WITH FOOD twice daily to prevent blood clots, as per orthopedic surgeon.  Follow up in 2 weeks with orthopedic surgery.   Increase activity slowly   Complete by: As directed    Leave dressing on - Keep it clean, dry, and intact until clinic visit   Complete by: As directed       Allergies as of 10/20/2020       Reactions   Amiodarone Other (See Comments)   Severe Thyroid issues    Fosamax [alendronate]    dysphagia   Penicillins Anaphylaxis, Other (See Comments)   TOLERATED CEFAZOLIN 07/06/20 Has patient had a PCN reaction causing immediate rash, facial/tongue/throat swelling, SOB or lightheadedness with hypotension: Yes Has patient had a PCN reaction causing severe rash involving mucus membranes or skin necrosis: No Has patient had a PCN reaction that required hospitalization: No Has patient had a PCN reaction occurring within the last 10 years: No If all of the above answers are "NO", then may proceed with Cephalosporin use.   Sulfa Antibiotics Itching, Rash   Itching   Actos [pioglitazone]    Not effective    Amaryl [glimepiride]    Not effective    Atorvastatin Other (See Comments)   Muscle aches & All statins per Patient   Bentyl [dicyclomine Hcl]    Could not tolerate nausea, reduced  concentration, h/a   Ciprofloxacin Other (See Comments)   High blood pressure    Codeine Nausea And Vomiting   Ezetimibe-simvastatin Other (See Comments)   Muscle aches, nausea, back pain    Lovastatin Other (See Comments)   Myalgias   Macrobid [nitrofurantoin Macrocrystal]    Severe Itching, rash   Protonix [pantoprazole Sodium]    Esophageal problems  Wants removed from list   Rosiglitazone Maleate Other (See Comments)   Edema   Statins    Muscle and joint aches to all statins   Victoza [liraglutide]    Nausea   Alendronate Sodium Rash   Dysphagia and ulceration    Bactrim [sulfamethoxazole-trimethoprim] Rash   itching        Medication List     STOP taking these medications    levofloxacin 250 MG tablet Commonly known as: Levaquin   losartan 25 MG tablet Commonly known as: COZAAR   potassium chloride 10 MEQ tablet  Commonly known as: KLOR-CON   Xarelto 20 MG Tabs tablet Generic drug: rivaroxaban       TAKE these medications    acetaminophen 500 MG tablet Commonly known as: TYLENOL Take 2 tablets (1,000 mg total) by mouth every 6 (six) hours as needed for moderate pain.   Align 4 MG Caps Take 4 mg by mouth daily.   alum & mag hydroxide-simeth 200-200-20 MG/5ML suspension Commonly known as: MAALOX/MYLANTA Take 30 mLs by mouth every 4 (four) hours as needed for indigestion.   aspirin 325 MG tablet Take 1 tablet (325 mg total) by mouth 2 (two) times daily.   bisoprolol 5 MG tablet Commonly known as: ZEBETA Take 5 mg by mouth in the morning and at bedtime.   Coenzyme Q10 50 MG Tabs Take 10 mg by mouth daily.   dimethicone-zinc oxide cream Apply topically 2 (two) times daily as needed for dry skin. Please label the bottle for the vaginal area only.   docusate sodium 100 MG capsule Commonly known as: COLACE Take 100 mg by mouth daily as needed for mild constipation.   estradiol 0.1 MG/GM vaginal cream Commonly known as: ESTRACE APPLY 0.5MG   (PEA SIZED AMOUNT) JUST INSIDE THE VAGINA WITH FINGER-TIP ON MONDAY, WEDNESDAY AND FRIDAY NIGHTS.   folic acid 1 MG tablet Commonly known as: FOLVITE Take 1 tablet (1 mg total) by mouth daily. Start taking on: October 21, 2020   furosemide 20 MG tablet Commonly known as: LASIX Take 1 tablet (20 mg total) by mouth 2 (two) times daily.   glucose blood test strip Commonly known as: ONE TOUCH ULTRA TEST Use to test blood sugar once daily E11.49 What changed: additional instructions   HYDROcodone-acetaminophen 5-325 MG tablet Commonly known as: NORCO/VICODIN Take 1 tablet by mouth every 6 (six) hours as needed for up to 5 days for severe pain (pain not controlled by Tramadol).   insulin aspart 100 UNIT/ML injection Commonly known as: novoLOG Inject 0-15 Units into the skin 4 (four) times daily -  before meals and at bedtime.   insulin glargine 100 UNIT/ML injection Commonly known as: LANTUS Inject 0.1 mLs (10 Units total) into the skin daily. Start taking on: October 21, 2020   levothyroxine 50 MCG tablet Commonly known as: SYNTHROID Take 50 mcg by mouth daily before breakfast.   magnesium hydroxide 400 MG/5ML suspension Commonly known as: MILK OF MAGNESIA Take 30 mLs by mouth daily as needed for mild constipation.   Magnesium Oxide 250 MG Tabs Take 250 mg by mouth daily.   metFORMIN 500 MG 24 hr tablet Commonly known as: GLUCOPHAGE-XR Take 500 mg by mouth 2 (two) times daily.   MULTIVITAMIN GUMMIES ADULT PO Take 1 each by mouth daily.   Olopatadine HCl 0.2 % Soln Place 1 drop into both eyes daily as needed (Allergies).   Omega-3 1000 MG Caps Take 1,000 mg by mouth 2 (two) times daily.   ondansetron 4 MG tablet Commonly known as: ZOFRAN Take 1 tablet (4 mg total) by mouth every 6 (six) hours as needed for nausea.   polyethylene glycol 17 g packet Commonly known as: MIRALAX / GLYCOLAX Take 17 g by mouth daily. Start taking on: October 21, 2020   psyllium 58.6 %  packet Commonly known as: METAMUCIL Take 1 packet by mouth daily as needed (constipation).   pyridOXINE 50 MG tablet Commonly known as: B-6 Take 1 tablet (50 mg total) by mouth daily. Start taking on: October 21, 2020   senna 8.6 MG  Tabs tablet Commonly known as: SENOKOT Take 1 tablet (8.6 mg total) by mouth 2 (two) times daily.   Tradjenta 5 MG Tabs tablet Generic drug: linagliptin Take 5 mg by mouth daily.   traMADol 50 MG tablet Commonly known as: ULTRAM Take 1 tablet (50 mg total) by mouth every 6 (six) hours for 14 days.   vitamin B-12 1000 MCG tablet Commonly known as: CYANOCOBALAMIN Take 1,000 mcg by mouth daily. W/ Folate and B6 sublingual What changed: Another medication with the same name was added. Make sure you understand how and when to take each.   cyanocobalamin 1000 MCG tablet Take 1 tablet (1,000 mcg total) by mouth daily. Start taking on: October 21, 2020 What changed: You were already taking a medication with the same name, and this prescription was added. Make sure you understand how and when to take each.   Vitamin C Powd Take 2,000 mg by mouth daily.               Discharge Care Instructions  (From admission, onward)           Start     Ordered   10/20/20 0000  Leave dressing on - Keep it clean, dry, and intact until clinic visit        10/20/20 1100            Contact information for after-discharge care     Destination     [facility] .   Service: Skilled Nursing Contact information: [address] [place] [phone]                    Allergies  Allergen Reactions   Amiodarone Other (See Comments)    Severe Thyroid issues    Fosamax [Alendronate]     dysphagia   Penicillins Anaphylaxis and Other (See Comments)    TOLERATED CEFAZOLIN 07/06/20 Has patient had a PCN reaction causing immediate rash, facial/tongue/throat swelling, SOB or lightheadedness  with hypotension: Yes Has patient had a PCN reaction causing severe rash involving mucus membranes or skin necrosis: No Has patient had a PCN reaction that required hospitalization: No Has patient had a PCN reaction occurring within the last 10 years: No If all of the above answers are "NO", then may proceed with Cephalosporin use.    Sulfa Antibiotics Itching and Rash    Itching    Actos [Pioglitazone]     Not effective    Amaryl [Glimepiride]     Not effective     Atorvastatin Other (See Comments)    Muscle aches & All statins per Patient   Bentyl [Dicyclomine Hcl]     Could not tolerate nausea, reduced concentration, h/a    Ciprofloxacin Other (See Comments)    High blood pressure     Codeine Nausea And Vomiting   Ezetimibe-Simvastatin Other (See Comments)    Muscle aches, nausea, back pain    Lovastatin Other (See Comments)    Myalgias   Macrobid [Nitrofurantoin Macrocrystal]     Severe Itching, rash    Protonix [Pantoprazole Sodium]     Esophageal problems  Wants removed from list   Rosiglitazone Maleate Other (See Comments)    Edema   Statins     Muscle and joint aches to all statins   Victoza [Liraglutide]     Nausea    Alendronate Sodium Rash    Dysphagia and ulceration    Bactrim [Sulfamethoxazole-Trimethoprim] Rash    itching  If you experience worsening of your admission symptoms, develop shortness of breath, life threatening emergency, suicidal or homicidal thoughts you must seek medical attention immediately by calling 911 or calling your MD immediately  if symptoms less severe.    Please note   You were cared for by a hospitalist during your hospital stay. If you have any questions about your discharge medications or the care you received while you were in the hospital after you are discharged, you can call the unit and asked to speak with the hospitalist on call if the hospitalist that took care of you is not available. Once you are discharged,  your primary care physician will handle any further medical issues. Please note that NO REFILLS for any discharge medications will be authorized once you are discharged, as it is imperative that you return to your primary care physician (or establish a relationship with a primary care physician if you do not have one) for your aftercare needs so that they can reassess your need for medications and monitor your lab values.   Consultations: Orthopedic surgery    Procedures/Studies: DG Chest 2 View  Result Date: 10/09/2020 CLINICAL DATA:  Right-sided chest pain EXAM: CHEST - 2 VIEW COMPARISON:  04/06/2020 FINDINGS: Cardiac shadow is enlarged. Aortic calcifications are noted. The lungs are well aerated bilaterally without focal infiltrate or effusion. Changes of prior vertebral augmentation are noted and stable. Postsurgical changes in the left shoulder are noted. IMPRESSION: No acute abnormality noted. Electronically Signed   By: Inez Catalina M.D.   On: 10/09/2020 00:52   DG Elbow 2 Views Left  Result Date: 10/10/2020 CLINICAL DATA:  Tripped and fell EXAM: LEFT ELBOW - 2 VIEW COMPARISON:  09/24/2014 FINDINGS: Frontal and lateral views of the left elbow demonstrate a minimally displaced intra-articular fracture involving the lateral aspect of the radial head. Large joint effusion. Prominent dorsal soft tissue swelling of the proximal forearm. IMPRESSION: 1. Minimally displaced intra-articular fracture lateral aspect radial head. 2. Large joint effusion. 3. Prominent dorsal soft tissue swelling. Electronically Signed   By: Randa Ngo M.D.   On: 10/10/2020 03:08   CT Head Wo Contrast  Result Date: 10/10/2020 CLINICAL DATA:  Fall EXAM: CT HEAD WITHOUT CONTRAST CT CERVICAL SPINE WITHOUT CONTRAST TECHNIQUE: Multidetector CT imaging of the head and cervical spine was performed following the standard protocol without intravenous contrast. Multiplanar CT image reconstructions of the cervical spine were  also generated. COMPARISON:  10/08/2020 FINDINGS: CT HEAD FINDINGS Brain: There is no mass, hemorrhage or extra-axial collection. There is generalized atrophy without lobar predilection. There is hypoattenuation of the periventricular white matter, most commonly indicating chronic ischemic microangiopathy. Vascular: Atherosclerotic calcification of the vertebral and internal carotid arteries at the skull base. No abnormal hyperdensity of the major intracranial arteries or dural venous sinuses. Skull: The visualized skull base, calvarium and extracranial soft tissues are normal. Sinuses/Orbits: No fluid levels or advanced mucosal thickening of the visualized paranasal sinuses. No mastoid or middle ear effusion. The orbits are normal. CT CERVICAL SPINE FINDINGS Alignment: No static subluxation. Facets are aligned. Occipital condyles are normally positioned. Skull base and vertebrae: No acute fracture. Soft tissues and spinal canal: No prevertebral fluid or swelling. No visible canal hematoma. Disc levels: No advanced spinal canal or neural foraminal stenosis. Multilevel degenerative change. Upper chest: No pneumothorax, pulmonary nodule or pleural effusion. Other: Normal visualized paraspinal cervical soft tissues. IMPRESSION: 1. Chronic ischemic microangiopathy and generalized atrophy without acute intracranial abnormality. 2. No acute fracture or static  subluxation of the cervical spine. Electronically Signed   By: Ulyses Jarred M.D.   On: 10/10/2020 03:23   CT HEAD WO CONTRAST  Result Date: 10/08/2020 CLINICAL DATA:  Right-sided facial weakness upon awakening yesterday, headache EXAM: CT HEAD WITHOUT CONTRAST TECHNIQUE: Contiguous axial images were obtained from the base of the skull through the vertex without intravenous contrast. COMPARISON:  06/26/2013 FINDINGS: Brain: No acute infarct or hemorrhage. The lateral ventricles and midline structures are unremarkable. No acute extra-axial fluid collections. No  mass effect. Vascular: Diffuse atherosclerosis throughout the internal carotid arteries. No hyperdense vessel. Skull: Normal. Negative for fracture or focal lesion. Sinuses/Orbits: No acute finding. Other: None. IMPRESSION: 1. No acute intracranial process. Electronically Signed   By: Randa Ngo M.D.   On: 10/08/2020 19:12   CT Cervical Spine Wo Contrast  Result Date: 10/10/2020 CLINICAL DATA:  Fall EXAM: CT HEAD WITHOUT CONTRAST CT CERVICAL SPINE WITHOUT CONTRAST TECHNIQUE: Multidetector CT imaging of the head and cervical spine was performed following the standard protocol without intravenous contrast. Multiplanar CT image reconstructions of the cervical spine were also generated. COMPARISON:  10/08/2020 FINDINGS: CT HEAD FINDINGS Brain: There is no mass, hemorrhage or extra-axial collection. There is generalized atrophy without lobar predilection. There is hypoattenuation of the periventricular white matter, most commonly indicating chronic ischemic microangiopathy. Vascular: Atherosclerotic calcification of the vertebral and internal carotid arteries at the skull base. No abnormal hyperdensity of the major intracranial arteries or dural venous sinuses. Skull: The visualized skull base, calvarium and extracranial soft tissues are normal. Sinuses/Orbits: No fluid levels or advanced mucosal thickening of the visualized paranasal sinuses. No mastoid or middle ear effusion. The orbits are normal. CT CERVICAL SPINE FINDINGS Alignment: No static subluxation. Facets are aligned. Occipital condyles are normally positioned. Skull base and vertebrae: No acute fracture. Soft tissues and spinal canal: No prevertebral fluid or swelling. No visible canal hematoma. Disc levels: No advanced spinal canal or neural foraminal stenosis. Multilevel degenerative change. Upper chest: No pneumothorax, pulmonary nodule or pleural effusion. Other: Normal visualized paraspinal cervical soft tissues. IMPRESSION: 1. Chronic ischemic  microangiopathy and generalized atrophy without acute intracranial abnormality. 2. No acute fracture or static subluxation of the cervical spine. Electronically Signed   By: Ulyses Jarred M.D.   On: 10/10/2020 03:23   MR BRAIN WO CONTRAST  Result Date: 10/09/2020 CLINICAL DATA:  Headache and right facial weakness EXAM: MRI HEAD WITHOUT CONTRAST TECHNIQUE: Multiplanar, multiecho pulse sequences of the brain and surrounding structures were obtained without intravenous contrast. COMPARISON:  None. FINDINGS: Brain: No acute infarct, mass effect or extra-axial collection. No acute or chronic hemorrhage. There is multifocal hyperintense T2-weighted signal within the white matter. Generalized volume loss without a clear lobar predilection. The midline structures are normal. Vascular: Major flow voids are preserved. Skull and upper cervical spine: Normal calvarium and skull base. Visualized upper cervical spine and soft tissues are normal. Sinuses/Orbits:No paranasal sinus fluid levels or advanced mucosal thickening. No mastoid or middle ear effusion. Normal orbits. IMPRESSION: 1. No acute intracranial abnormality. 2. Generalized volume loss and findings of chronic small vessel disease. Electronically Signed   By: Ulyses Jarred M.D.   On: 10/09/2020 01:35   DG Chest Portable 1 View  Result Date: 10/10/2020 CLINICAL DATA:  [place], left hip fracture EXAM: PORTABLE CHEST 1 VIEW COMPARISON:  10/09/2020 FINDINGS: Single frontal view of the chest demonstrates a stable cardiac silhouette. No acute airspace disease, effusion, or pneumothorax. Prior vertebral augmentations at the thoracolumbar junction. Left shoulder arthroplasty. No  acute fractures. IMPRESSION: 1. No acute intrathoracic process. Electronically Signed   By: Randa Ngo M.D.   On: 10/10/2020 03:07   DG HIP OPERATIVE UNILAT W OR W/O PELVIS LEFT  Result Date: 10/12/2020 CLINICAL DATA:  Left intratrochanteric fracture EXAM: OPERATIVE LEFT HIP WITH PELVIS  COMPARISON:  10/10/2020 FLUOROSCOPY TIME:  Radiation Exposure Index (as provided by the fluoroscopic device): Not available If the device does not provide the exposure index: Fluoroscopy Time:  2 minutes 49 seconds Number of Acquired Images:  6 FINDINGS: Medullary rod is noted within the left femur with proximal and distal fixation screws. The proximal screw traverses the femoral neck. Fracture fragments are in near anatomic alignment. IMPRESSION: ORIF of proximal left femoral fracture. Electronically Signed   By: Inez Catalina M.D.   On: 10/12/2020 18:45   DG Hip Unilat W or Wo Pelvis 2-3 Views Left  Result Date: 10/10/2020 CLINICAL DATA:  Tripped and fell, deformity EXAM: DG HIP (WITH OR WITHOUT PELVIS) 2-3V LEFT COMPARISON:  None. FINDINGS: Frontal view of the pelvis as well as frontal and cross-table lateral views of left hip are obtained. There is a comminuted intertrochanteric left hip fracture with impaction and varus angulation at the fracture site. No dislocation. Remainder of the bony pelvis is unremarkable. Prior L4 vertebral augmentation. IMPRESSION: 1. Comminuted intertrochanteric left hip fracture with varus angulation and impaction. Electronically Signed   By: Randa Ngo M.D.   On: 10/10/2020 03:06   DG FEMUR PORT MIN 2 VIEWS LEFT  Result Date: 10/12/2020 CLINICAL DATA:  Postop EXAM: LEFT FEMUR PORTABLE 2 VIEWS COMPARISON:  10/10/2020 FINDINGS: Interval intramedullary rod and screw fixation of left femur for comminuted intertrochanteric fracture. Multiple displaced lesser trochanteric fracture fragments as before. Gas in the soft tissues consistent with recent surgery. IMPRESSION: Interval ORIF of proximal left femur fracture Electronically Signed   By: Donavan Foil M.D.   On: 10/12/2020 19:52     --- Hip fracture repair   Subjective: Pt seen this AM, awake sitting up in bed.  No acute events reproted.  Last BM 6/28 but pt says it's been longer, poor recall.  She mentions her back  hurting from prior compression fractures.  Currently hip pain not bad.  No other acute complaints.  Wants to be able to do for herself like she usually can.   Discharge Exam: Vitals:   10/20/20 0441 10/20/20 0758  BP: (!) 105/52 106/64  Pulse: 81 75  Resp: 16 16  Temp: 98.6 F (37 C) 98.1 F (36.7 C)  SpO2: 94% 97%   Vitals:   10/19/20 1607 10/19/20 1942 10/20/20 0441 10/20/20 0758  BP: (!) 93/51 (!) 102/54 (!) 105/52 106/64  Pulse: 80 66 81 75  Resp: 15 17 16 16   Temp: 98.7 F (37.1 C) 97.8 F (36.6 C) 98.6 F (37 C) 98.1 F (36.7 C)  TempSrc: Oral Oral Oral Oral  SpO2: 97% 97% 94% 97%  Weight:      Height:        General: Pt is alert, awake, not in acute distress Cardiovascular: RRR, S1/S2 +, no rubs, no gallops Respiratory: CTA bilaterally, no wheezing, no rhonchi Abdominal: Soft, NT, ND, bowel sounds + Extremities: left lateral thigh with wound vac dressing in place no visible surrounding erythema swelling or induration, no edema, no cyanosis, SCD's on BLE's    The results of significant diagnostics from this hospitalization (including imaging, microbiology, ancillary and laboratory) are listed below for reference.     Microbiology: Recent  Results (from the past 240 hour(s))  Surgical PCR screen     Status: None   Collection Time: 10/10/20 11:20 PM   Specimen: Nasal Mucosa; Nasal Swab  Result Value Ref Range Status   MRSA, PCR NEGATIVE NEGATIVE Final   Staphylococcus aureus NEGATIVE NEGATIVE Final    Comment: (NOTE) The Xpert SA Assay (FDA approved for NASAL specimens in patients 32 years of age and older), is one component of a comprehensive surveillance program. It is not intended to diagnose infection nor to guide or monitor treatment. Performed at [facility], [address] [place], [place] [address]   Urine Culture     Status: Abnormal   Collection Time: 10/17/20  5:23 PM   Specimen: Urine, Random  Result Value Ref Range Status    Specimen Description   Final    URINE, RANDOM Performed at [facility], [address] [place], [place] [address]    Special Requests   Final    NONE Performed at [facility], [address] [place], [place] [address]    Culture MULTIPLE SPECIES PRESENT, SUGGEST RECOLLECTION (A)  Final   Report Status 10/19/2020 FINAL  Final     Labs: BNP (last 3 results) Recent Labs    10/08/20 1848  BNP 130.8*   Basic Metabolic Panel: Recent Labs  Lab 10/15/20 0443 10/16/20 0433 10/17/20 0407 10/18/20 0531 10/20/20 0430  NA 138 138 136 136 137  K 4.2 4.7 5.3* 4.6 4.8  CL 107 108 106 102 101  CO2 26 27 27 28 28   GLUCOSE 132* 170* 151* 148* 117*  BUN 25* 28* 35* 27* 21  CREATININE 0.54 0.50 0.71 0.59 0.69  CALCIUM 7.3* 7.8* 8.0* 7.9* 8.3*   Liver Function Tests: No results for input(s): AST, ALT, ALKPHOS, BILITOT, PROT, ALBUMIN in the last 168 hours. No results for input(s): LIPASE, AMYLASE in the last 168 hours. No results for input(s): AMMONIA in the last 168 hours. CBC: Recent Labs  Lab 10/15/20 0443 10/16/20 0433 10/17/20 0407 10/18/20 0531 10/20/20 0430  WBC 9.3 7.2 8.4 7.5 6.9  HGB 8.9* 8.8* 8.6* 8.9* 9.3*  HCT 26.5* 26.8* 26.3* 27.3* 28.6*  MCV 101.1* 100.8* 104.0* 101.5* 101.8*  PLT 167 177 186 204 233   Cardiac Enzymes: No results for input(s): CKTOTAL, CKMB, CKMBINDEX, TROPONINI in the last 168 hours. BNP: Invalid input(s): POCBNP CBG: Recent Labs  Lab 10/19/20 0737 10/19/20 1146 10/19/20 1703 10/19/20 2132 10/20/20 0758  GLUCAP 120* 148* 205* 111* 108*   D-Dimer No results for input(s): DDIMER in the last 72 hours. Hgb A1c No results for input(s): HGBA1C in the last 72 hours. Lipid Profile No results for input(s): CHOL, HDL, LDLCALC, TRIG, CHOLHDL, LDLDIRECT in the last 72 hours. Thyroid function studies No results for input(s): TSH, T4TOTAL, T3FREE, THYROIDAB in the last 72 hours.  Invalid input(s): FREET3 Anemia work  up No results for input(s): VITAMINB12, FOLATE, FERRITIN, TIBC, IRON, RETICCTPCT in the last 72 hours. Urinalysis    Component Value Date/Time   COLORURINE YELLOW (A) 10/17/2020 1723   APPEARANCEUR HAZY (A) 10/17/2020 1723   APPEARANCEUR Cloudy (A) 09/15/2020 1127   LABSPEC 1.014 10/17/2020 1723   PHURINE 6.0 10/17/2020 1723   GLUCOSEU NEGATIVE 10/17/2020 1723   HGBUR NEGATIVE 10/17/2020 1723   HGBUR large 06/02/2010 [place] 10/17/2020 1723   BILIRUBINUR Negative 09/15/2020 [place] 10/17/2020 1723   PROTEINUR NEGATIVE 10/17/2020 1723   UROBILINOGEN 0.2 06/02/2010 1052  NITRITE NEGATIVE 10/17/2020 1723   LEUKOCYTESUR MODERATE (A) 10/17/2020 1723   Sepsis Labs Invalid input(s): PROCALCITONIN,  WBC,  LACTICIDVEN Microbiology Recent Results (from the past 240 hour(s))  Surgical PCR screen     Status: None   Collection Time: 10/10/20 11:20 PM   Specimen: Nasal Mucosa; Nasal Swab  Result Value Ref Range Status   MRSA, PCR NEGATIVE NEGATIVE Final   Staphylococcus aureus NEGATIVE NEGATIVE Final    Comment: (NOTE) The Xpert SA Assay (FDA approved for NASAL specimens in patients 44 years of age and older), is one component of a comprehensive surveillance program. It is not intended to diagnose infection nor to guide or monitor treatment. Performed at [facility], [address] [place], [place] [address]   Urine Culture     Status: Abnormal   Collection Time: 10/17/20  5:23 PM   Specimen: Urine, Random  Result Value Ref Range Status   Specimen Description   Final    URINE, RANDOM Performed at [facility], [address], [place], [place] [address]    Special Requests   Final    NONE Performed at [facility], [address] [place], [place] [address]    Culture MULTIPLE SPECIES PRESENT, SUGGEST RECOLLECTION (A)  Final   Report Status 10/19/2020 FINAL  Final     Time coordinating discharge: Over 30  minutes  SIGNED:   Ezekiel Slocumb, DO [facility] 10/20/2020, 11:01 AM   If 7PM-7AM, please contact night-coverage [email or web address]

## 2020-10-20 NOTE — Progress Notes (Signed)
Blood pressure 105/60, pulse 79, temperature 97.9 F (36.6 C), resp. rate 17, height 5\' 3"  (1.6 m), weight 62.2 kg, SpO2 97 %.  VSS, IV cath removed site was clean, dry and intact. Pt transported via EMS by stretcher with all belongings to [facility]. Attempted to call report twice to facility, each time I was unsuccessful.

## 2020-10-20 NOTE — TOC Progression Note (Signed)
Transition of Care [facility]) - Progression Note    Patient Details  Name: Jodi Reeves MRN: [id number] Date of Birth: 1927/08/25  Transition of Care [facility]) CM/SW Contact  Su Hilt, RN Phone Number: 10/20/2020, 3:32 PM  Clinical Narrative:     Called the son Jodi Reeves and notified him of the Covid results  and that I called EMSto transport to [facility]       Expected Discharge Plan and Services           Expected Discharge Date: 10/20/20                                     Social Determinants of Health (SDOH) Interventions    Readmission Risk Interventions No flowsheet data found.

## 2020-10-20 NOTE — TOC Progression Note (Signed)
Transition of Care [facility]) - Progression Note    Patient Details  Name: Jodi Reeves MRN: [id number] Date of Birth: 1927-07-29  Transition of Care [facility]) CM/SW [place], RN Phone Number: 10/20/2020, 9:00 AM  Clinical Narrative:     Spoke with Hilda Blades at [facility], they have insurance approval from [place],  She may DC today to [facility] if medically ready       Expected Discharge Plan and Services                                                 Social Determinants of Health (SDOH) Interventions    Readmission Risk Interventions No flowsheet data found.

## 2020-10-21 ENCOUNTER — Telehealth: Payer: Self-pay | Admitting: Urology

## 2020-10-21 ENCOUNTER — Other Ambulatory Visit: Payer: Self-pay

## 2020-10-21 DIAGNOSIS — N390 Urinary tract infection, site not specified: Secondary | ICD-10-CM

## 2020-10-21 NOTE — Telephone Encounter (Signed)
Jodi Reeves from [facility] called to let us know that this patient was discharged from [facility] to them and is unable to come for her lab appt on 7/5. She stated that if this needs to be done they can do it there. If you have questions please contact her @ [phone] ext [phone]    [facility]

## 2020-10-22 NOTE — Telephone Encounter (Signed)
Left message for Jodi Reeves that we can do labs when she is released

## 2020-10-22 NOTE — Telephone Encounter (Signed)
We can reschedule her lab appointment at a time when she can come to the office.

## 2020-10-26 ENCOUNTER — Other Ambulatory Visit: Payer: Self-pay

## 2020-11-03 ENCOUNTER — Other Ambulatory Visit: Payer: Self-pay

## 2020-11-04 ENCOUNTER — Ambulatory Visit: Payer: Self-pay | Admitting: Podiatry

## 2021-01-19 NOTE — Progress Notes (Signed)
MRN : [id number]  Jodi Reeves is a 87 y.o. (30-Mar-1928) female who presents with chief complaint of annual check up.  History of Present Illness:  The patient returns to the office for followup evaluation regarding leg swelling.    She has been in [facility] and her swelling has increased substantially.  The pain associated with swelling continues is increased. There have not been any interval development of a ulcerations or wounds.  Since the previous visit the patient has been wearing graduated compression stockings intermittently and has noted little if any improvement in the lymphedema.   The patient also states elevation during the day and exercise is being done too.   She has not been using her lymph pump since it is at home.   No outpatient medications have been marked as taking for the 01/20/21 encounter (Appointment) with Delana Meyer, Dolores Lory, MD.    Past Medical History:  Diagnosis Date   Allergy    Anxiety    Balance disorder    Chronic diastolic CHF (congestive heart failure) ([place])    a. 10/2018 Echo: EF 55-60%, diast dysfxn, nl RV fxn. RVSP 45.94mmHg. Mild-mod TR. Mod dil LA.   Colon polyps    Coronary artery disease, non-occlusive    a. LHC 11/2003: 10% LAD stenosis, 20% LCx stenosis, and 40% RCA stenosis; b. nuc stress test 12/15: small region of mild ischemia in the apical region with WMA also noted in the apical region, EF 60%. 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. since 2010   History of chicken pox    History of eating disorder    HLD (hyperlipidemia)    HTN (hypertension)    Hypothyroidism    IBS (irritable bowel syndrome)    diarrhea    Incontinence of  bowel    Osteopenia    Osteoporosis    Ovarian cancer ([place])    1980   Persistent atrial fibrillation ([place])    a. CHADS2VASc = > 8 (CHF, HTN, age x 2, DM, TIA x 2, female); b. on Xarelto   SBO (small bowel obstruction) ([place])    1998   TIA (transient ischemic attack)    UTI (urinary tract infection)    Venous insufficiency    Ventral hernia     Past Surgical History:  Procedure Laterality Date   2nd look laparotomy  1982   ABDOMINAL HYSTERECTOMY     for ovarian cancer s/p hysterectomy total 1976 and exp lap [place]  8/05   neg; a fib found    CARPAL TUNNEL RELEASE Left 03/06/2018   Procedure: CARPAL TUNNEL RELEASE ENDOSCOPIC;  Surgeon: Corky Mull, MD;  Location: [place];  Service: Orthopedics;  Laterality: Left;  diabetic - oral meds   CARPAL TUNNEL RELEASE     left CTS Dr.Poggi   cataract OD  2002   cataract surgery  5/07   R   CHOLECYSTECTOMY  1986   DEXA  9/04 and 1/02   EGD/dilation/colon  1/06   ESOPHAGOGASTRODUODENOSCOPY (EGD) WITH PROPOFOL N/A 05/16/2016   Procedure: ESOPHAGOGASTRODUODENOSCOPY (  EGD) WITH PROPOFOL with dilation;  Surgeon: Lucilla Lame, MD;  Location: [facility];  Service: Endoscopy;  Laterality: N/A;   FEMORAL HERNIA REPAIR  1958   R   fracture L elbow/wrist  2000   INTRAMEDULLARY (IM) NAIL INTERTROCHANTERIC Left 10/12/2020   Procedure: INTRAMEDULLARY (IM) NAIL INTERTROCHANTRIC;  Surgeon: Thornton Park, MD;  Location: [facility];  Service: Orthopedics;  Laterality: Left;   intussception/obstruction  12/98   JOINT REPLACEMENT     left shoulder   kidney stone x2  1991   KYPHOPLASTY N/A 04/13/2020   Procedure: [id number];  Surgeon: Hessie Knows, MD;  Location: [facility];  Service: Orthopedics;  Laterality: N/A;   KYPHOPLASTY N/A 05/18/2020   Procedure: L1 KYPHOPLASTY;  Surgeon: Hessie Knows, MD;  Location: [facility];  Service: Orthopedics;  Laterality: N/A;   KYPHOPLASTY  N/A 07/06/2020   Procedure: L4  KYPHOPLASTY;  Surgeon: Hessie Knows, MD;  Location: [facility];  Service: Orthopedics;  Laterality: N/A;   laminectomy L4-5  1971   LUMBAR LAMINECTOMY     1970 ruptured disc    MOUTH SURGERY     myoview stress  8/05   syncope (-)   REVERSE SHOULDER ARTHROPLASTY Left 10/06/2014   Procedure: REVERSE SHOULDER ARTHROPLASTY;  Surgeon: Corky Mull, MD;  Location: [facility];  Service: Orthopedics;  Laterality: Left;   stillbirth  1969   TOTAL ABDOMINAL HYSTERECTOMY W/ BILATERAL SALPINGOOPHORECTOMY  1980   ovarian cancer   WRIST FRACTURE SURGERY  11/05   R    Social History Social History   Tobacco Use   Smoking status: Never   Smokeless tobacco: Never  Vaping Use   Vaping Use: Never used  Substance Use Topics   Alcohol use: No    Comment: may have occasional glass of wine   Drug use: No    Family History Family History  Problem Relation Age of Onset   Early death Father    Diabetes Mother    Heart disease Mother    Arthritis Brother    Cancer Brother    Depression Brother    Hearing loss Brother    Arthritis Brother    Cancer Brother        colon   Hearing loss Brother    Cancer Brother    Diabetes Brother    Hearing loss Brother    Heart disease Brother    Hyperlipidemia Brother    Heart disease Sister    Hypertension Sister    Stroke Sister    Hearing loss Daughter    Hypertension Daughter    Diabetes Paternal Grandfather    Hearing loss Sister    Heart disease Sister    Arthritis Sister    Depression Sister    Hearing loss Sister    Heart disease Sister    COPD Brother    Early death Brother    Early death Brother     Allergies  Allergen Reactions   Amiodarone Other (See Comments)    Severe Thyroid issues    Fosamax [Alendronate]     dysphagia   Penicillins Anaphylaxis and Other (See Comments)    TOLERATED CEFAZOLIN 07/06/20 Has patient had a PCN reaction causing immediate rash, facial/tongue/throat swelling, SOB or  lightheadedness with hypotension: Yes Has patient had a PCN reaction causing severe rash involving mucus membranes or skin necrosis: No Has patient had a PCN reaction that required hospitalization: No Has patient had a PCN reaction occurring within the last 10 years: No If all of the  above answers are "NO", then may proceed with Cephalosporin use.    Sulfa Antibiotics Itching and Rash    Itching    Actos [Pioglitazone]     Not effective    Amaryl [Glimepiride]     Not effective     Atorvastatin Other (See Comments)    Muscle aches & All statins per Patient   Bentyl [Dicyclomine Hcl]     Could not tolerate nausea, reduced concentration, h/a    Ciprofloxacin Other (See Comments)    High blood pressure     Codeine Nausea And Vomiting   Ezetimibe-Simvastatin Other (See Comments)    Muscle aches, nausea, back pain    Lovastatin Other (See Comments)    Myalgias   Macrobid [Nitrofurantoin Macrocrystal]     Severe Itching, rash    Protonix [Pantoprazole Sodium]     Esophageal problems  Wants removed from list   Rosiglitazone Maleate Other (See Comments)    Edema   Statins     Muscle and joint aches to all statins   Victoza [Liraglutide]     Nausea    Alendronate Sodium Rash    Dysphagia and ulceration    Bactrim [Sulfamethoxazole-Trimethoprim] Rash    itching     REVIEW OF SYSTEMS (Negative unless checked)  Constitutional: [] Weight loss  [] Fever  [] Chills Cardiac: [] Chest pain   [] Chest pressure   [] Palpitations   [] Shortness of breath when laying flat   [] Shortness of breath with exertion. Vascular:  [] Pain in legs with walking   [x] Pain in legs at rest  [] History of DVT   [] Phlebitis   [x] Swelling in legs   [] Varicose veins   [] Non-healing ulcers Pulmonary:   [] Uses home oxygen   [] Productive cough   [] Hemoptysis   [] Wheeze  [] COPD   [] Asthma Neurologic:  [] Dizziness   [] Seizures   [] History of stroke   [] History of TIA  [] Aphasia   [] Vissual changes   [] Weakness or  numbness in arm   [] Weakness or numbness in leg Musculoskeletal:   [] Joint swelling   [] Joint pain   [] Low back pain Hematologic:  [] Easy bruising  [] Easy bleeding   [] Hypercoagulable state   [] Anemic Gastrointestinal:  [] Diarrhea   [] Vomiting  [] Gastroesophageal reflux/heartburn   [] Difficulty swallowing. Genitourinary:  [] Chronic kidney disease   [] Difficult urination  [] Frequent urination   [] Blood in urine Skin:  [x] Rashes   [] Ulcers  Psychological:  [] History of anxiety   []  History of major depression.  Physical Examination  There were no vitals filed for this visit. There is no height or weight on file to calculate BMI. Gen: WD/WN, NAD Head: Jal/AT, No temporalis wasting.  Ear/Nose/Throat: Hearing grossly intact, nares w/o erythema or drainage, pinna without lesions Eyes: PER, EOMI, sclera nonicteric.  Neck: Supple, no gross masses.  No JVD.  Pulmonary:  Good air movement, no audible wheezing, no use of accessory muscles.  Cardiac: RRR, precordium not hyperdynamic. Vascular:  scattered varicosities present bilaterally.  Severe venous stasis changes to the legs bilaterally.  3-4+ soft pitting edema  Vessel Right Left  Radial Palpable Palpable  Gastrointestinal: soft, non-distended. No guarding/no peritoneal signs.  Musculoskeletal: M/S 5/5 throughout.  No deformity.  Neurologic: CN 2-12 intact. Pain and light touch intact in extremities.  Symmetrical.  Speech is fluent. Motor exam as listed above. Psychiatric: Judgment intact, Mood & affect appropriate for pt's clinical situation. Dermatologic: Severe venous rashes no ulcers noted.  No changes consistent with cellulitis. Lymph : No lichenification or skin changes of chronic lymphedema.  CBC Lab Results  Component Value Date   WBC 6.9 10/20/2020   HGB 9.3 (L) 10/20/2020   HCT 28.6 (L) 10/20/2020   MCV 101.8 (H) 10/20/2020   PLT 233 10/20/2020    BMET    Component Value Date/Time   NA 137 10/20/2020 0430   NA 139  09/14/2020 1451   K 4.8 10/20/2020 0430   CL 101 10/20/2020 0430   CO2 28 10/20/2020 0430   GLUCOSE 117 (H) 10/20/2020 0430   BUN 21 10/20/2020 0430   BUN 17 09/14/2020 1451   CREATININE 0.69 10/20/2020 0430   CALCIUM 8.3 (L) 10/20/2020 0430   GFRNONAA >60 10/20/2020 0430   GFRAA 89 09/26/2019 1011   CrCl cannot be calculated (Patient's most recent lab result is older than the maximum 21 days allowed.).  COAG Lab Results  Component Value Date   INR 2.7 (H) 10/10/2020   INR 1.17 10/06/2014   INR 1.70 10/01/2014    Radiology No results found.   Assessment/Plan 1. Lymphedema  No surgery or intervention at this point in time.    I have reviewed my discussion with the patient regarding lymphedema and why it  causes symptoms.  Patient will continue wearing graduated compression stockings class 1 (20-30 mmHg) on a daily basis a prescription was given.   I have ordered that she have assistance with placement every morning by the staff.  The patient is reminded to put the stockings on first thing in the morning and removing them in the evening. The patient is instructed specifically not to sleep in the stockings.   In addition, behavioral modification throughout the day will be continued.  This will include frequent elevation (such as in a recliner), use of over the counter pain medications as needed and exercise such as walking.  I have reviewed systemic causes for chronic edema such as liver, kidney and cardiac etiologies and there does not appear to be any significant changes in these organ systems over the past year.  The patient is under the impression that these organ systems are all stable and unchanged.    The patient should have her pump brought from home and should continue aggressive use of the  lymph pump.  This was also ordered.  This will continue to improve the edema control and prevent sequela such as ulcers and infections.   The patient will follow-up with me on an  annual basis.    2. Chronic venous insufficiency See #1  3. Coronary artery disease involving native coronary artery of native heart without angina pectoris Continue cardiac and antihypertensive medications as already ordered and reviewed, no changes at this time.  Continue statin as ordered and reviewed, no changes at this time  Nitrates PRN for chest pain   4. Hypertension associated with diabetes ([place]) Continue antihypertensive medications as already ordered, these medications have been reviewed and there are no changes at this time.   5. Type 2 diabetes mellitus with neurological manifestations, controlled ([place]) Continue hypoglycemic medications as already ordered, these medications have been reviewed and there are no changes at this time.  Hgb A1C to be monitored as already arranged by primary service     Hortencia Pilar, MD  01/19/2021 6:04 PM

## 2021-01-20 ENCOUNTER — Encounter (INDEPENDENT_AMBULATORY_CARE_PROVIDER_SITE_OTHER): Payer: Self-pay | Admitting: Vascular Surgery

## 2021-01-20 ENCOUNTER — Ambulatory Visit (INDEPENDENT_AMBULATORY_CARE_PROVIDER_SITE_OTHER): Payer: Medicare HMO | Admitting: Vascular Surgery

## 2021-01-20 ENCOUNTER — Other Ambulatory Visit: Payer: Self-pay

## 2021-01-20 VITALS — BP 135/79 | HR 80 | Resp 16

## 2021-01-20 DIAGNOSIS — I2510 Atherosclerotic heart disease of native coronary artery without angina pectoris: Secondary | ICD-10-CM | POA: Diagnosis not present

## 2021-01-20 DIAGNOSIS — E1159 Type 2 diabetes mellitus with other circulatory complications: Secondary | ICD-10-CM | POA: Diagnosis not present

## 2021-01-20 DIAGNOSIS — I152 Hypertension secondary to endocrine disorders: Secondary | ICD-10-CM

## 2021-01-20 DIAGNOSIS — I872 Venous insufficiency (chronic) (peripheral): Secondary | ICD-10-CM | POA: Diagnosis not present

## 2021-01-20 DIAGNOSIS — I890 Lymphedema, not elsewhere classified: Secondary | ICD-10-CM

## 2021-01-20 DIAGNOSIS — E1149 Type 2 diabetes mellitus with other diabetic neurological complication: Secondary | ICD-10-CM

## 2021-01-22 ENCOUNTER — Encounter (INDEPENDENT_AMBULATORY_CARE_PROVIDER_SITE_OTHER): Payer: Self-pay | Admitting: Vascular Surgery

## 2021-02-03 ENCOUNTER — Ambulatory Visit (INDEPENDENT_AMBULATORY_CARE_PROVIDER_SITE_OTHER): Payer: Medicare HMO | Admitting: Vascular Surgery

## 2021-04-21 ENCOUNTER — Encounter (INDEPENDENT_AMBULATORY_CARE_PROVIDER_SITE_OTHER): Payer: Self-pay | Admitting: Nurse Practitioner

## 2021-04-21 ENCOUNTER — Ambulatory Visit (INDEPENDENT_AMBULATORY_CARE_PROVIDER_SITE_OTHER): Payer: Medicare HMO | Admitting: Nurse Practitioner

## 2021-04-21 ENCOUNTER — Other Ambulatory Visit: Payer: Self-pay

## 2021-04-21 VITALS — BP 137/75 | HR 69 | Resp 16

## 2021-04-21 DIAGNOSIS — E1159 Type 2 diabetes mellitus with other circulatory complications: Secondary | ICD-10-CM

## 2021-04-21 DIAGNOSIS — E1149 Type 2 diabetes mellitus with other diabetic neurological complication: Secondary | ICD-10-CM

## 2021-04-21 DIAGNOSIS — I890 Lymphedema, not elsewhere classified: Secondary | ICD-10-CM | POA: Diagnosis not present

## 2021-04-21 DIAGNOSIS — I152 Hypertension secondary to endocrine disorders: Secondary | ICD-10-CM

## 2021-04-23 IMAGING — MR MR LUMBAR SPINE W/O CM
5 series · 30 of 48 positions shown · non-contrast
Comparison: 05/18/2020 and prior.

CLINICAL DATA: Closed compression fracture of body of L1 vertebra.
Lower back pain.

EXAM:
MRI LUMBAR SPINE WITHOUT CONTRAST
TECHNIQUE: Multiplanar, multisequence MR imaging of the lumbar spine was
performed. No intravenous contrast was administered.

[Series 5: T2 · sagittal · 4.0mm · 0.81mm/px · 6 of 18 slices shown (1 of 2)]
[im 1/18]
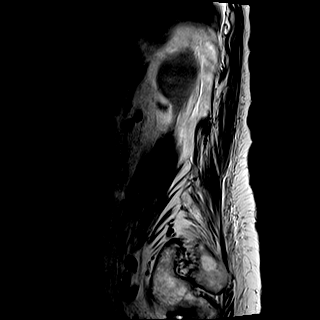
[im 4/18]
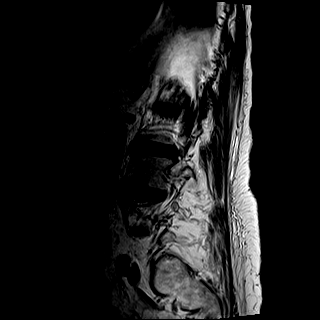
[im 7/18]
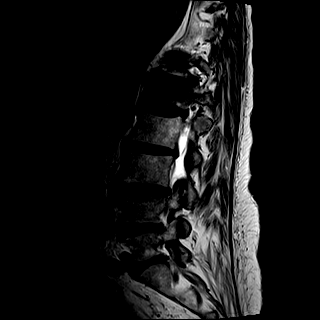
[im 11/18]
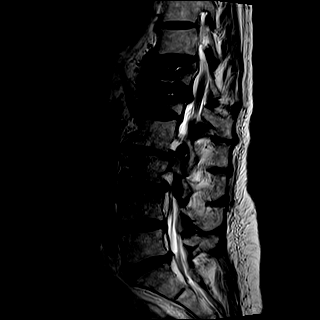
[im 14/18]
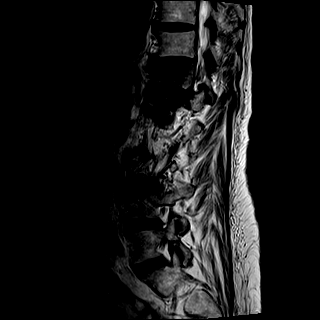
[im 18/18]
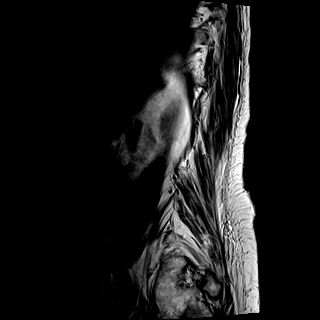

[Series 6: T1 · sagittal · 4.0mm · 0.81mm/px · 7 of 18 slices shown (1 of 2)]
[im 1/18]
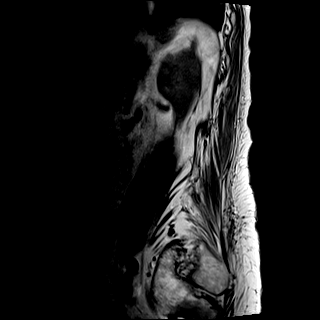
[im 3/18]
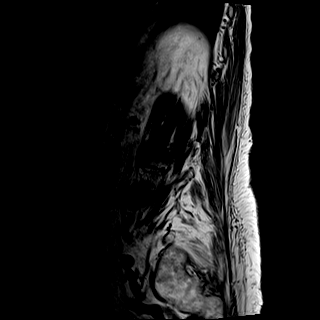
[im 6/18]
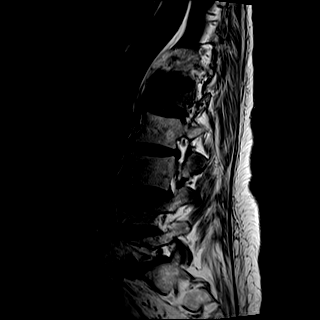
[im 9/18]
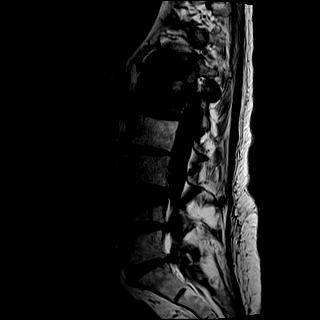
[im 12/18]
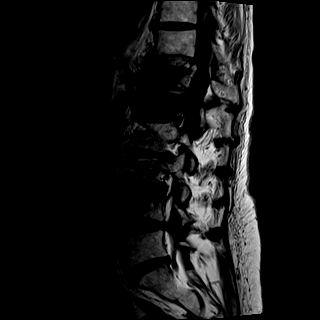
[im 15/18]
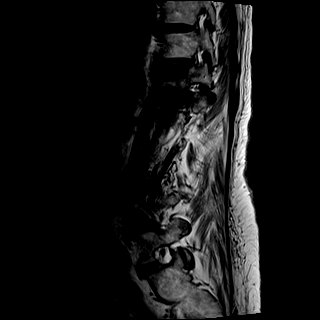
[im 18/18]
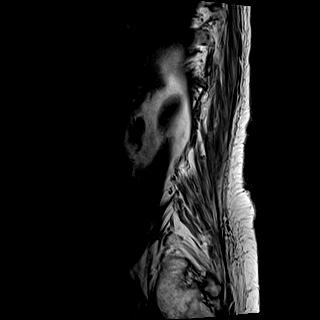

[Series 7: STIR · sagittal · 4.0mm · 0.41mm/px · 1 of 18 slices shown]
[im 1/18]
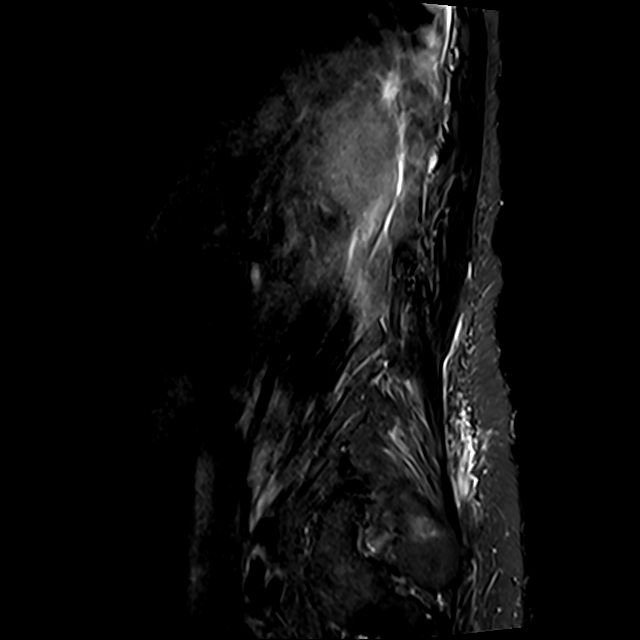

[Series 8: T2 · axial · 4.0mm · 0.78mm/px · z∈[-102,+110]mm · 8 of 37 slices shown (2 of 2)]
[im 1/37]
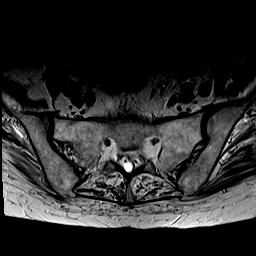
[im 6/37]
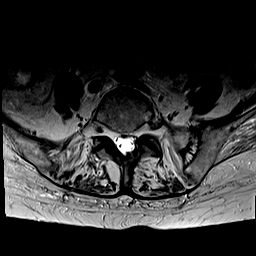
[im 12/37]
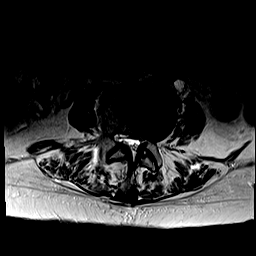
[im 17/37]
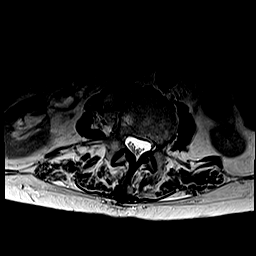
[im 20/37]
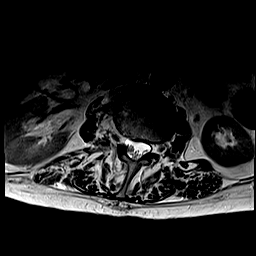
[im 25/37]
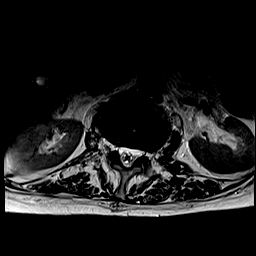
[im 31/37]
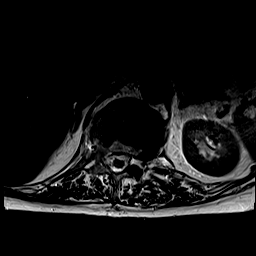
[im 37/37]
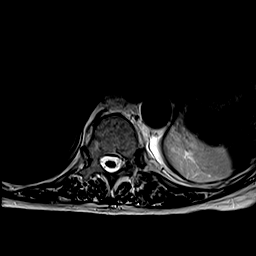

[Series 9: T1 · axial · 4.0mm · 0.39mm/px · z∈[-102,+110]mm · 8 of 37 slices shown (2 of 2)]
[im 1/37]
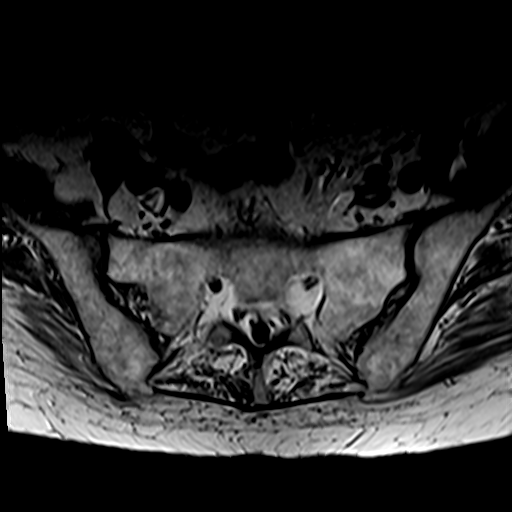
[im 6/37]
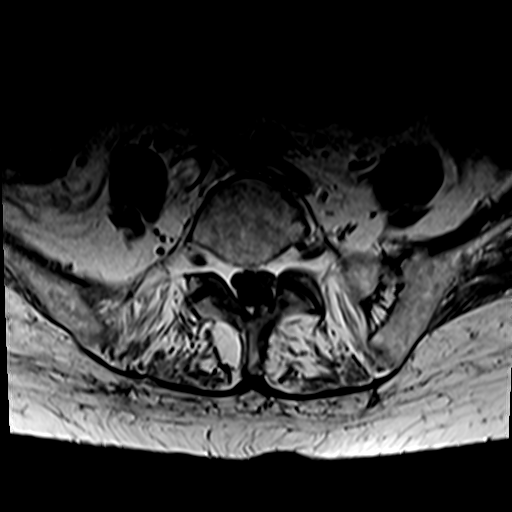
[im 12/37]
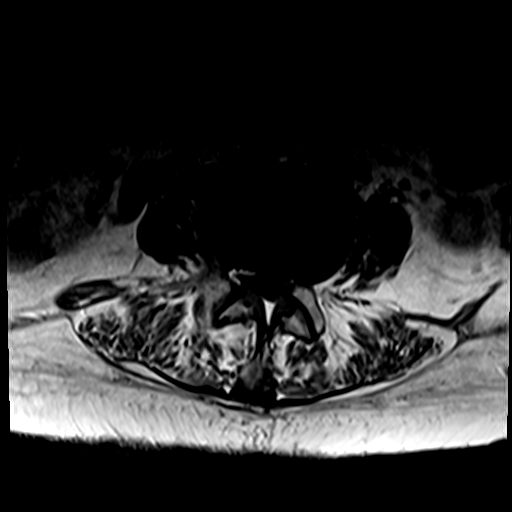
[im 17/37]
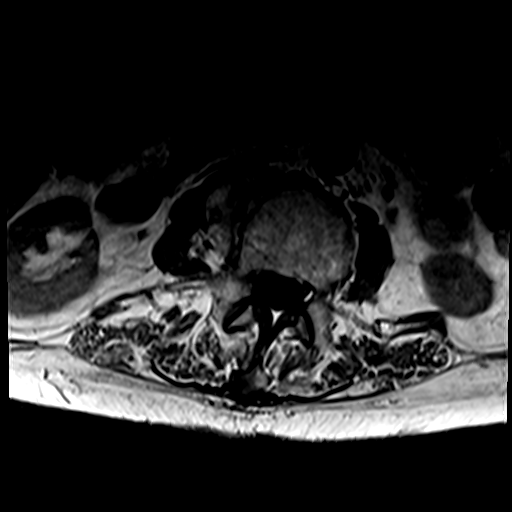
[im 20/37]
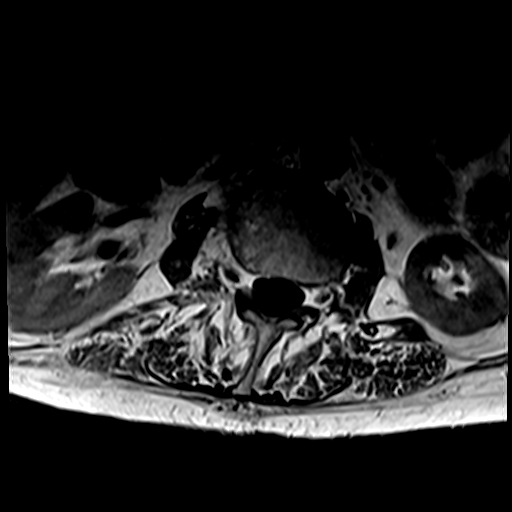
[im 25/37]
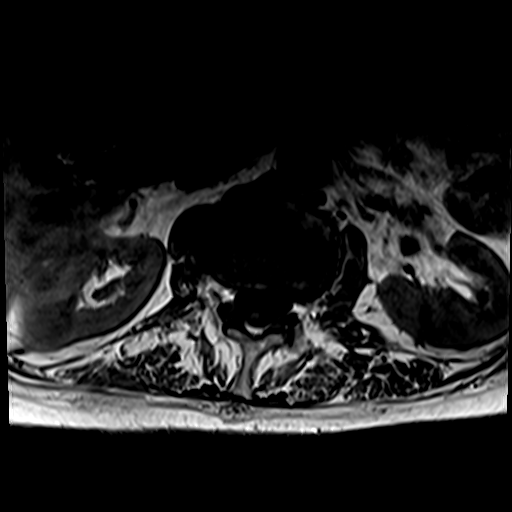
[im 31/37]
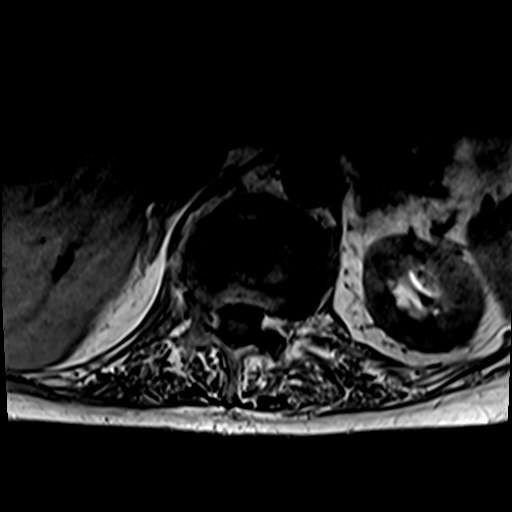
[im 37/37]
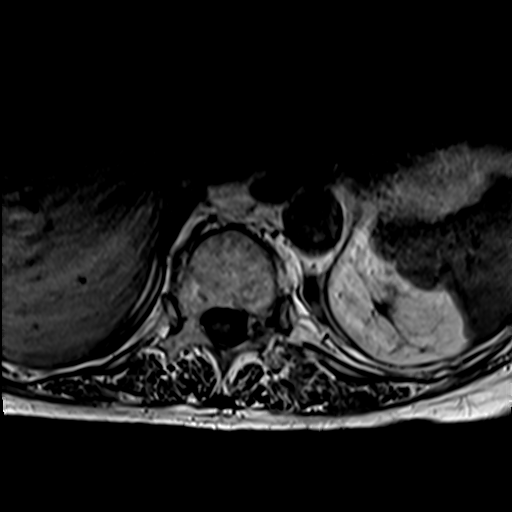

[30 of 48 positions shown; findings below may reference images not displayed]

FINDINGS: Segmentation:  Standard.

Alignment:  Lumbar levocurvature.  Trace L1-L2 retrolisthesis.

Vertebrae: Sequela of T12-L1 kyphoplasty with mild height loss. 3-4
mm retropulsion at this level. Mild superior L4 endplate depression
deformity with underlying bone marrow edema concerning for subacute
fracture. Minimal height loss.

Conus medullaris and cauda equina: Conus extends to the L2 level.
Conus and cauda equina appear normal.

Disc levels: Multilevel desiccation and disc space loss.

T11-12: Left predominant disc bulge and bilateral facet hypertrophy.
T12 kyphoplasty with 3 mm retropulsion partially effacing the
ventral CSF containing spaces. Patent right neural foramen. Mild
spinal canal and left neural foraminal narrowing.

T12-L1: Left predominant disc bulge with superimposed right
foraminal protrusion. Smaller left foraminal protrusion. Bilateral
facet hypertrophy. L1 kyphoplasty with 4 mm retropulsion. Mild
spinal canal and moderate right neural foraminal narrowing. Patent
left neural foramen.

L1-2: Mild disc bulge and bilateral facet hypertrophy. Prominent
ligamentum flavum. Mild right neural foraminal narrowing. Patent
spinal canal and left neural foramen.

L2-3: Disc bulge with superimposed right paracentral/foraminal
protrusion. Bilateral facet hypertrophy and prominent ligamentum
flavum. Shallow left extraforaminal protrusion/annular fissuring.
Mild spinal canal, moderate right and mild left neural foraminal
narrowing.

L3-4: Disc bulge with shallow central protrusions. Bilateral facet
hypertrophy. Moderate spinal canal and bilateral neural foraminal
narrowing.

L4-5: Mild disc bulge, ligamentum flavum thickening and bilateral
facet hypertrophy. Mild spinal canal and mild-to-moderate neural
foraminal narrowing.

L5-S1: Mild disc bulge with small central protrusion. Bilateral
facet degenerative spurring. Query left laminotomy. Patent spinal
canal and neural foramen.

Paraspinal and other soft tissues: Negative.
IMPRESSION: Superior L4 endplate subacute compression deformity with minimal
height loss and no retropulsion, new since prior exam.

Sequela of T12, L1 kyphoplasties with mild-to-moderate height loss
and 3-4 mm retropulsion. Mild spinal canal narrowing at the T11-L1
levels. Mild left T11-12 and moderate right T12-L1 neural foraminal
narrowing.

Moderate spinal canal and bilateral neural foraminal narrowing at
the L3-4 level.

Mild to moderate bilateral L4-5 neural foraminal narrowing.

These results will be called to the ordering clinician or
representative by the Radiologist Assistant, and communication
documented in the PACS or [REDACTED].

## 2021-05-01 ENCOUNTER — Encounter (INDEPENDENT_AMBULATORY_CARE_PROVIDER_SITE_OTHER): Payer: Self-pay | Admitting: Nurse Practitioner

## 2021-05-01 NOTE — Progress Notes (Signed)
Subjective:    Patient ID: Jodi Reeves, female    DOB: 10/09/1927, 88 y.o.   MRN: [id number] Chief Complaint  Patient presents with   Follow-up    3 month follow up    The patient returns to the office for followup evaluation regarding leg swelling.  The swelling has persisted and the pain associated with swelling continues. There have not been any interval development of a ulcerations or wounds.  Since the previous visit the patient has been wearing graduated compression stockings and has noted little if any improvement in the lymphedema. The patient has been using compression routinely morning until night.  The patient also states elevation during the day and exercise is being done too.      Review of Systems  Cardiovascular:  Positive for leg swelling.  All other systems reviewed and are negative.     Objective:   Physical Exam Vitals reviewed.  HENT:     Head: Normocephalic.  Cardiovascular:     Rate and Rhythm: Normal rate.  Pulmonary:     Effort: Pulmonary effort is normal.  Musculoskeletal:     Right lower leg: 2+ Edema present.     Left lower leg: 2+ Edema present.  Skin:    General: Skin is warm and dry.  Neurological:     Mental Status: She is alert and oriented to person, place, and time.  Psychiatric:        Mood and Affect: Mood normal.        Behavior: Behavior normal.        Thought Content: Thought content normal.        Judgment: Judgment normal.    BP 137/75 (BP Location: Right Arm)    Pulse 69    Resp 16   Past Medical History:  Diagnosis Date   Allergy    Anxiety    Balance disorder    Chronic diastolic CHF (congestive heart failure) ([place])    a. 10/2018 Echo: EF 55-60%, diast dysfxn, nl RV fxn. RVSP 45.49mmHg. Mild-mod TR. Mod dil LA.   Colon polyps    Coronary artery disease, non-occlusive    a. LHC 11/2003: 10% LAD stenosis, 20% LCx stenosis, and 40% RCA stenosis; b. nuc stress test 12/15: small region of mild ischemia in the apical  region with WMA also noted in the apical region, EF 60%. She declined invasive evaluation at that time   DM2 (diabetes mellitus, type 2) ([place])    Fall    04/2017 see careeverywhere [facility] multiple fractures, brusing    Falls frequently    h/o left shoudler/wrist fracture and left fingers numb   GERD (gastroesophageal reflux disease)    esophageal web   Granuloma annulare    [name]. since 2010   History of chicken pox    History of eating disorder    HLD (hyperlipidemia)    HTN (hypertension)    Hypothyroidism    IBS (irritable bowel syndrome)    diarrhea    Incontinence of bowel    Osteopenia    Osteoporosis    Ovarian cancer ([place])    1980   Persistent atrial fibrillation ([place])    a. CHADS2VASc = > 8 (CHF, HTN, age x 2, DM, TIA x 2, female); b. 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  Tobacco Use   Smoking status: Never   Smokeless tobacco: Never  Vaping Use   Vaping Use: Never used  Substance and Sexual Activity   Alcohol use: No    Comment: may have occasional glass of wine   Drug use: No   Sexual activity: Not Currently  Other Topics Concern   Not on file  Social History Narrative   Has living will   Son is healthcare POA    also has grand daughter and great grand son   Would accept CPR but not prolonged artificial ventilation    No feeding tube if cognitively unaware   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)   2 pregnancies 1 stillborn    She has transportation problems no longer drives and has trouble finding a ride   Investment banker, operational of Radio broadcast assistant Strain: Not on file   Food Insecurity: No Food Insecurity   Worried About Charity fundraiser in the Last Year: Never true   Arboriculturist in the Last Year: Never true  Transportation Needs: Not on file  Physical Activity: Insufficiently Active   Days of Exercise per Week: 3 days   Minutes of Exercise per Session: 20 min  Stress: No Stress Concern Present   Feeling of Stress : Not at all  Social Connections: Unknown   Frequency of Communication with Friends and Family: Not on file   Frequency of Social Gatherings with Friends and Family: More than three times a week   Attends Religious Services: Not on Electrical engineer or Organizations: Not on file   Attends Archivist Meetings: Not on file   Marital Status: Not on file  Intimate Partner Violence: Not At Risk   Fear of Current or Ex-Partner: No   Emotionally Abused: No   Physically Abused: No   Sexually Abused: No    Past Surgical History:  Procedure Laterality Date   2nd look laparotomy  [place]     for ovarian cancer s/p hysterectomy total 1976 and exp lap [place]  8/05   neg; a fib found    CARPAL TUNNEL RELEASE Left 03/06/2018   Procedure: CARPAL TUNNEL RELEASE ENDOSCOPIC;  Surgeon: Corky Mull, MD;  Location: [facility];  Service: Orthopedics;  Laterality: Left;  diabetic - oral meds   CARPAL TUNNEL RELEASE     left CTS Dr.Poggi   cataract OD  2002   cataract surgery  5/07   R   CHOLECYSTECTOMY  1986   DEXA  9/04 and 1/02   EGD/dilation/colon  1/06   ESOPHAGOGASTRODUODENOSCOPY (EGD) WITH PROPOFOL N/A 05/16/2016   Procedure: ESOPHAGOGASTRODUODENOSCOPY (EGD) WITH PROPOFOL with dilation;  Surgeon: Lucilla Lame, MD;  Location: [facility];  Service: Endoscopy;  Laterality: N/A;   FEMORAL HERNIA REPAIR  1958   R   fracture L elbow/wrist  2000   INTRAMEDULLARY (IM) NAIL INTERTROCHANTERIC Left 10/12/2020   Procedure:  INTRAMEDULLARY (IM) NAIL INTERTROCHANTRIC;  Surgeon: Thornton Park, MD;  Location: [facility];  Service: Orthopedics;  Laterality: Left;   intussception/obstruction  12/98  JOINT REPLACEMENT     left shoulder   kidney stone x2  1991   KYPHOPLASTY N/A 04/13/2020   Procedure: [id number];  Surgeon: Hessie Knows, MD;  Location: [facility];  Service: Orthopedics;  Laterality: N/A;   KYPHOPLASTY N/A 05/18/2020   Procedure: L1 KYPHOPLASTY;  Surgeon: Hessie Knows, MD;  Location: [facility];  Service: Orthopedics;  Laterality: N/A;   KYPHOPLASTY N/A 07/06/2020   Procedure: L4  KYPHOPLASTY;  Surgeon: Hessie Knows, MD;  Location: [facility];  Service: Orthopedics;  Laterality: N/A;   laminectomy L4-5  1971   LUMBAR LAMINECTOMY     1970 ruptured disc    MOUTH SURGERY     myoview stress  8/05   syncope (-)   REVERSE SHOULDER ARTHROPLASTY Left 10/06/2014   Procedure: REVERSE SHOULDER ARTHROPLASTY;  Surgeon: Corky Mull, MD;  Location: [facility];  Service: Orthopedics;  Laterality: Left;   stillbirth  1969   TOTAL ABDOMINAL HYSTERECTOMY W/ BILATERAL SALPINGOOPHORECTOMY  1980   ovarian cancer   WRIST FRACTURE SURGERY  11/05   R    Family History  Problem Relation Age of Onset   Early death Father    Diabetes Mother    Heart disease Mother    Arthritis Brother    Cancer Brother    Depression Brother    Hearing loss Brother    Arthritis Brother    Cancer Brother        colon   Hearing loss Brother    Cancer Brother    Diabetes Brother    Hearing loss Brother    Heart disease Brother    Hyperlipidemia Brother    Heart disease Sister    Hypertension Sister    Stroke Sister    Hearing loss Daughter    Hypertension Daughter    Diabetes Paternal Grandfather    Hearing loss Sister    Heart disease Sister    Arthritis Sister    Depression Sister    Hearing loss Sister    Heart disease Sister    COPD Brother    Early death Brother    Early death Brother     Allergies  Allergen  Reactions   Amiodarone Other (See Comments)    Severe Thyroid issues    Fosamax [Alendronate]     dysphagia   Penicillins Anaphylaxis and Other (See Comments)    TOLERATED CEFAZOLIN 07/06/20 Has patient had a PCN reaction causing immediate rash, facial/tongue/throat swelling, SOB or lightheadedness with hypotension: Yes Has patient had a PCN reaction causing severe rash involving mucus membranes or skin necrosis: No Has patient had a PCN reaction that required hospitalization: No Has patient had a PCN reaction occurring within the last 10 years: No If all of the above answers are "NO", then may proceed with Cephalosporin use.    Sulfa Antibiotics Itching and Rash    Itching    Actos [Pioglitazone]     Not effective    Amaryl [Glimepiride]     Not effective     Atorvastatin Other (See Comments)    Muscle aches & All statins per Patient   Bentyl [Dicyclomine Hcl]     Could not tolerate nausea, reduced concentration, h/a    Ciprofloxacin Other (See Comments)    High blood pressure     Codeine Nausea And Vomiting   Ezetimibe-Simvastatin Other (See Comments)    Muscle aches, nausea, back pain    Lovastatin Other (See Comments)    Myalgias   Macrobid [Nitrofurantoin Macrocrystal]  Severe Itching, rash    Protonix [Pantoprazole Sodium]     Esophageal problems  Wants removed from list   Rosiglitazone Maleate Other (See Comments)    Edema   Statins     Muscle and joint aches to all statins   Victoza [Liraglutide]     Nausea    Alendronate Sodium Rash    Dysphagia and ulceration    Bactrim [Sulfamethoxazole-Trimethoprim] Rash    itching    CBC Latest Ref Rng & Units 10/20/2020 10/18/2020 10/17/2020  WBC 4.0 - 10.5 K/uL 6.9 7.5 8.4  Hemoglobin 12.0 - 15.0 g/dL 9.3(L) 8.9(L) 8.6(L)  Hematocrit 36.0 - 46.0 % 28.6(L) 27.3(L) 26.3(L)  Platelets 150 - 400 K/uL 233 204 186      CMP     Component Value Date/Time   NA 137 10/20/2020 0430   NA 139 09/14/2020 1451   K 4.8  10/20/2020 0430   CL 101 10/20/2020 0430   CO2 28 10/20/2020 0430   GLUCOSE 117 (H) 10/20/2020 0430   BUN 21 10/20/2020 0430   BUN 17 09/14/2020 1451   CREATININE 0.69 10/20/2020 0430   CALCIUM 8.3 (L) 10/20/2020 0430   PROT 7.6 10/08/2020 1848   PROT 7.3 09/26/2019 1011   ALBUMIN 3.9 10/08/2020 1848   ALBUMIN 4.3 09/26/2019 1011   AST 21 10/08/2020 1848   ALT 10 10/08/2020 1848   ALKPHOS 62 10/08/2020 1848   BILITOT 0.6 10/08/2020 1848   BILITOT 0.7 09/26/2019 1011   GFRNONAA >60 10/20/2020 0430   GFRAA 89 09/26/2019 1011     No results found.     Assessment & Plan:   1. Lymphedema I have had a long discussion with the patient regarding swelling and why it  causes symptoms.  Patient will begin wearing graduated compression stockings class 1 (20-30 mmHg) on a daily basis a prescription was given. The patient will  beginning wearing the stockings first thing in the morning and removing them in the evening. The patient is instructed specifically not to sleep in the stockings.   In addition, behavioral modification will be initiated.  This will include frequent elevation, use of over the counter pain medications and exercise such as walking.   The patient will follow-up in six weeks  2. Hypertension associated with diabetes ([place]) Continue antihypertensive medications as already ordered, these medications have been reviewed and there are no changes at this time.   3. Type 2 diabetes mellitus with neurological manifestations, controlled ([place]) Continue hypoglycemic medications as already ordered, these medications have been reviewed and there are no changes at this time.  Hgb A1C to be monitored as already arranged by primary service    Current Outpatient Medications on File Prior to Visit  Medication Sig Dispense Refill   acetaminophen (TYLENOL) 500 MG tablet Take 2 tablets (1,000 mg total) by mouth every 6 (six) hours as needed for moderate pain. 60 tablet 0   alum & mag  hydroxide-simeth (MAALOX/MYLANTA) 200-200-20 MG/5ML suspension Take 30 mLs by mouth every 4 (four) hours as needed for indigestion. 355 mL 0   Ascorbic Acid (VITAMIN C) 1000 MG tablet Take 2,000 mg by mouth daily.     aspirin 325 MG EC tablet Take 325 mg by mouth daily.     bisoprolol (ZEBETA) 5 MG tablet Take 5 mg by mouth in the morning and at bedtime.     Coenzyme Q10 50 MG TABS Take 10 mg by mouth daily.     docusate sodium (COLACE) 100 MG  capsule Take 100 mg by mouth daily as needed for mild constipation.     estradiol (ESTRACE) 0.1 MG/GM vaginal cream APPLY 0.5MG  (PEA SIZED AMOUNT) JUST INSIDE THE VAGINA WITH FINGER-TIP ON MONDAY, WEDNESDAY AND FRIDAY NIGHTS. 28.3 g 0   folic acid (FOLVITE) 1 MG tablet Take 1 tablet (1 mg total) by mouth daily.     furosemide (LASIX) 20 MG tablet Take 1 tablet (20 mg total) by mouth 2 (two) times daily. 60 tablet 5   glucose blood (ONE TOUCH ULTRA TEST) test strip Use to test blood sugar once daily E11.49 100 each 1   insulin aspart (NOVOLOG) 100 UNIT/ML injection Inject 0-15 Units into the skin 4 (four) times daily -  before meals and at bedtime. 10 mL 11   insulin glargine (LANTUS) 100 UNIT/ML injection Inject 0.1 mLs (10 Units total) into the skin daily. 10 mL 11   Lactobacillus (ACIDOPHILUS PO) Take 175 mg by mouth daily.     levothyroxine (SYNTHROID, LEVOTHROID) 50 MCG tablet Take 50 mcg by mouth daily before breakfast.     magnesium hydroxide (MILK OF MAGNESIA) 400 MG/5ML suspension Take 30 mLs by mouth daily as needed for mild constipation. 355 mL 0   Magnesium Oxide 250 MG TABS Take 250 mg by mouth daily.     metFORMIN (GLUCOPHAGE-XR) 500 MG 24 hr tablet Take 500 mg by mouth 2 (two) times daily.      Multiple Vitamins-Minerals (MULTIVITAMIN GUMMIES ADULT PO) Take 1 each by mouth daily.     naproxen sodium (ALEVE) 220 MG tablet Take 220 mg by mouth daily as needed.     Olopatadine HCl 0.2 % SOLN Place 1 drop into both eyes daily as needed (Allergies).      Omega-3 1000 MG CAPS Take 1,000 mg by mouth 2 (two) times daily.     ondansetron (ZOFRAN) 4 MG tablet Take 1 tablet (4 mg total) by mouth every 6 (six) hours as needed for nausea. 20 tablet 0   polyethylene glycol (MIRALAX / GLYCOLAX) 17 g packet Take 17 g by mouth daily. 14 each 0   psyllium (METAMUCIL) 58.6 % packet Take 1 packet by mouth daily as needed (constipation).     pyridOXINE (B-6) 50 MG tablet Take 1 tablet (50 mg total) by mouth daily.     simethicone (MYLICON) 80 MG chewable tablet Chew 80 mg by mouth every 6 (six) hours as needed for flatulence.     Skin Protectants, Misc. (DIMETHICONE-ZINC OXIDE) cream Apply topically 2 (two) times daily as needed for dry skin. Please label the bottle for the vaginal area only. 142 g 0   TRADJENTA 5 MG TABS tablet Take 5 mg by mouth daily.     traMADol (ULTRAM) 50 MG tablet Take by mouth every 8 (eight) hours as needed.     vitamin B-12 (CYANOCOBALAMIN) 1000 MCG tablet Take 1,000 mcg by mouth daily. W/ Folate and B6 sublingual     vitamin B-12 1000 MCG tablet Take 1 tablet (1,000 mcg total) by mouth daily.     Probiotic Product (ALIGN) 4 MG CAPS Take 4 mg by mouth daily. (Patient not taking: Reported on 01/20/2021)     senna (SENOKOT) 8.6 MG TABS tablet Take 1 tablet (8.6 mg total) by mouth 2 (two) times daily. (Patient not taking: Reported on 01/20/2021) 120 tablet 0   No current facility-administered medications on file prior to visit.    There are no Patient Instructions on file for this visit. No follow-ups on  file.   Kris Hartmann, NP

## 2021-06-02 ENCOUNTER — Encounter (INDEPENDENT_AMBULATORY_CARE_PROVIDER_SITE_OTHER): Payer: Self-pay | Admitting: Vascular Surgery

## 2021-06-02 ENCOUNTER — Other Ambulatory Visit: Payer: Self-pay

## 2021-06-02 ENCOUNTER — Ambulatory Visit (INDEPENDENT_AMBULATORY_CARE_PROVIDER_SITE_OTHER): Payer: Medicare HMO | Admitting: Nurse Practitioner

## 2021-06-02 VITALS — BP 113/73 | HR 73 | Resp 16

## 2021-06-02 DIAGNOSIS — E1159 Type 2 diabetes mellitus with other circulatory complications: Secondary | ICD-10-CM

## 2021-06-02 DIAGNOSIS — I152 Hypertension secondary to endocrine disorders: Secondary | ICD-10-CM

## 2021-06-02 DIAGNOSIS — I890 Lymphedema, not elsewhere classified: Secondary | ICD-10-CM

## 2021-06-12 ENCOUNTER — Encounter (INDEPENDENT_AMBULATORY_CARE_PROVIDER_SITE_OTHER): Payer: Self-pay | Admitting: Nurse Practitioner

## 2021-06-12 NOTE — Progress Notes (Signed)
Subjective:    Patient ID: Jodi Reeves, female    DOB: October 27, 1927, 88 y.o.   MRN: [id number] Chief Complaint  Patient presents with   Follow-up    6 week follow up    The patient returns to the office for followup evaluation regarding leg swelling.  The swelling has persisted and the pain associated with swelling continues. There have not been any interval development of a ulcerations or wounds.  Since the previous visit the patient has been wearing graduated compression stockings and has noted little if any improvement in the lymphedema. The patient has not been using compression routinely morning until night.  The patient is currently in an assisted living facility she notes that they are not placing compression garments on her as well as she is not being able to utilize her lymphedema pump.     Review of Systems  Cardiovascular:  Positive for leg swelling.  All other systems reviewed and are negative.     Objective:   Physical Exam Vitals reviewed.  HENT:     Head: Normocephalic.  Cardiovascular:     Rate and Rhythm: Normal rate.  Pulmonary:     Effort: Pulmonary effort is normal.  Musculoskeletal:     Right lower leg: Edema present.     Left lower leg: Edema present.  Neurological:     Mental Status: She is alert and oriented to person, place, and time.     Motor: Weakness present.  Psychiatric:        Mood and Affect: Mood normal.        Behavior: Behavior normal.        Thought Content: Thought content normal.        Judgment: Judgment normal.    BP 113/73 (BP Location: Right Arm)    Pulse 73    Resp 16   Past Medical History:  Diagnosis Date   Allergy    Anxiety    Balance disorder    Chronic diastolic CHF (congestive heart failure) ([place])    a. 10/2018 Echo: EF 55-60%, diast dysfxn, nl RV fxn. RVSP 45.50mmHg. Mild-mod TR. Mod dil LA.   Colon polyps    Coronary artery disease, non-occlusive    a. LHC 11/2003: 10% LAD stenosis, 20% LCx stenosis, and 40%  RCA stenosis; b. nuc stress test 12/15: small region of mild ischemia in the apical region with WMA also noted in the apical region, EF 60%. She declined invasive evaluation at that time   DM2 (diabetes mellitus, type 2) ([place])    Fall    04/2017 see careeverywhere [facility] multiple fractures, brusing    Falls frequently    h/o left shoudler/wrist fracture and left fingers numb   GERD (gastroesophageal reflux disease)    esophageal web   Granuloma annulare    [name]. since 2010   History of chicken pox    History of eating disorder    HLD (hyperlipidemia)    HTN (hypertension)    Hypothyroidism    IBS (irritable bowel syndrome)    diarrhea    Incontinence of bowel    Osteopenia    Osteoporosis    Ovarian cancer ([place])    1980   Persistent atrial fibrillation ([place])    a. CHADS2VASc = > 8 (CHF, HTN, age x 2, DM, TIA x 2, female); b. 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  Tobacco Use   Smoking status: Never   Smokeless tobacco: Never  Vaping Use   Vaping Use: Never used  Substance and Sexual Activity   Alcohol use: No    Comment: may have occasional glass of wine   Drug use: No   Sexual activity: Not Currently  Other Topics Concern   Not on file  Social History Narrative   Has living will   Son is healthcare POA    also has grand daughter and great grand son   Would accept CPR but not prolonged artificial ventilation    No feeding tube if cognitively unaware   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)   2 pregnancies 1 stillborn    She has transportation problems no longer drives and has trouble finding a  ride   Investment banker, operational of Radio broadcast assistant Strain: Not on file  Food Insecurity: No Food Insecurity   Worried About Charity fundraiser in the Last Year: Never true   Arboriculturist in the Last Year: Never true  Transportation Needs: Not on file  Physical Activity: Insufficiently Active   Days of Exercise per Week: 3 days   Minutes of Exercise per Session: 20 min  Stress: No Stress Concern Present   Feeling of Stress : Not at all  Social Connections: Unknown   Frequency of Communication with Friends and Family: Not on file   Frequency of Social Gatherings with Friends and Family: More than three times a week   Attends Religious Services: Not on Electrical engineer or Organizations: Not on file   Attends Archivist Meetings: Not on file   Marital Status: Not on file  Intimate Partner Violence: Not At Risk   Fear of Current or Ex-Partner: No   Emotionally Abused: No   Physically Abused: No   Sexually Abused: No    Past Surgical History:  Procedure Laterality Date   2nd look laparotomy  [place]     for ovarian cancer s/p hysterectomy total 1976 and exp lap [place]  8/05   neg; a fib found    CARPAL TUNNEL RELEASE Left 03/06/2018   Procedure: CARPAL TUNNEL RELEASE ENDOSCOPIC;  Surgeon: Corky Mull, MD;  Location: [facility];  Service: Orthopedics;  Laterality: Left;  diabetic - oral meds   CARPAL TUNNEL RELEASE     left CTS Dr.Poggi   cataract OD  2002   cataract surgery  5/07   R   CHOLECYSTECTOMY  1986   DEXA  9/04 and 1/02   EGD/dilation/colon  1/06   ESOPHAGOGASTRODUODENOSCOPY (EGD) WITH PROPOFOL N/A 05/16/2016   Procedure: ESOPHAGOGASTRODUODENOSCOPY (EGD) WITH PROPOFOL with dilation;  Surgeon: Lucilla Lame, MD;  Location: [facility];  Service: Endoscopy;  Laterality: N/A;   FEMORAL HERNIA REPAIR  1958   R   fracture L elbow/wrist  2000    INTRAMEDULLARY (IM) NAIL INTERTROCHANTERIC Left 10/12/2020   Procedure: INTRAMEDULLARY (IM) NAIL INTERTROCHANTRIC;  Surgeon: Thornton Park, MD;  Location: [facility];  Service: Orthopedics;  Laterality: Left;   intussception/obstruction  12/98   JOINT REPLACEMENT     left shoulder   kidney stone x2  1991   KYPHOPLASTY N/A 04/13/2020   Procedure: [id number];  Surgeon: Hessie Knows, MD;  Location: [facility];  Service: Orthopedics;  Laterality: N/A;   KYPHOPLASTY N/A 05/18/2020   Procedure: L1 KYPHOPLASTY;  Surgeon: Hessie Knows, MD;  Location: [facility];  Service: Orthopedics;  Laterality: N/A;   KYPHOPLASTY N/A 07/06/2020   Procedure: L4  KYPHOPLASTY;  Surgeon: Hessie Knows, MD;  Location: [facility];  Service: Orthopedics;  Laterality: N/A;   laminectomy L4-5  1971   LUMBAR LAMINECTOMY     1970 ruptured disc    MOUTH SURGERY     myoview stress  8/05   syncope (-)   REVERSE SHOULDER ARTHROPLASTY Left 10/06/2014   Procedure: REVERSE SHOULDER ARTHROPLASTY;  Surgeon: Corky Mull, MD;  Location: [facility];  Service: Orthopedics;  Laterality: Left;   stillbirth  1969   TOTAL ABDOMINAL HYSTERECTOMY W/ BILATERAL SALPINGOOPHORECTOMY  1980   ovarian cancer   WRIST FRACTURE SURGERY  11/05   R    Family History  Problem Relation Age of Onset   Early death Father    Diabetes Mother    Heart disease Mother    Arthritis Brother    Cancer Brother    Depression Brother    Hearing loss Brother    Arthritis Brother    Cancer Brother        colon   Hearing loss Brother    Cancer Brother    Diabetes Brother    Hearing loss Brother    Heart disease Brother    Hyperlipidemia Brother    Heart disease Sister    Hypertension Sister    Stroke Sister    Hearing loss Daughter    Hypertension Daughter    Diabetes Paternal Grandfather    Hearing loss Sister    Heart disease Sister    Arthritis Sister    Depression Sister    Hearing loss Sister    Heart disease Sister    COPD Brother     Early death Brother    Early death Brother     Allergies  Allergen Reactions   Amiodarone Other (See Comments)    Severe Thyroid issues    Fosamax [Alendronate]     dysphagia   Penicillins Anaphylaxis and Other (See Comments)    TOLERATED CEFAZOLIN 07/06/20 Has patient had a PCN reaction causing immediate rash, facial/tongue/throat swelling, SOB or lightheadedness with hypotension: Yes Has patient had a PCN reaction causing severe rash involving mucus membranes or skin necrosis: No Has patient had a PCN reaction that required hospitalization: No Has patient had a PCN reaction occurring within the last 10 years: No If all of the above answers are "NO", then may proceed with Cephalosporin use.    Sulfa Antibiotics Itching and Rash    Itching    Actos [Pioglitazone]     Not effective    Amaryl [Glimepiride]     Not effective     Atorvastatin Other (See Comments)    Muscle aches & All statins per Patient   Bentyl [Dicyclomine Hcl]     Could not tolerate nausea, reduced concentration, h/a    Ciprofloxacin Other (See Comments)    High blood pressure     Codeine Nausea And Vomiting   Ezetimibe-Simvastatin Other (See Comments)    Muscle aches, nausea, back pain    Lovastatin Other (See  Comments)    Myalgias   Macrobid [Nitrofurantoin Macrocrystal]     Severe Itching, rash    Protonix [Pantoprazole Sodium]     Esophageal problems  Wants removed from list   Rosiglitazone Maleate Other (See Comments)    Edema   Statins     Muscle and joint aches to all statins   Victoza [Liraglutide]     Nausea    Alendronate Sodium Rash    Dysphagia and ulceration    Bactrim [Sulfamethoxazole-Trimethoprim] Rash    itching    CBC Latest Ref Rng & Units 10/20/2020 10/18/2020 10/17/2020  WBC 4.0 - 10.5 K/uL 6.9 7.5 8.4  Hemoglobin 12.0 - 15.0 g/dL 9.3(L) 8.9(L) 8.6(L)  Hematocrit 36.0 - 46.0 % 28.6(L) 27.3(L) 26.3(L)  Platelets 150 - 400 K/uL 233 204 186      CMP     Component  Value Date/Time   NA 137 10/20/2020 0430   NA 139 09/14/2020 1451   K 4.8 10/20/2020 0430   CL 101 10/20/2020 0430   CO2 28 10/20/2020 0430   GLUCOSE 117 (H) 10/20/2020 0430   BUN 21 10/20/2020 0430   BUN 17 09/14/2020 1451   CREATININE 0.69 10/20/2020 0430   CALCIUM 8.3 (L) 10/20/2020 0430   PROT 7.6 10/08/2020 1848   PROT 7.3 09/26/2019 1011   ALBUMIN 3.9 10/08/2020 1848   ALBUMIN 4.3 09/26/2019 1011   AST 21 10/08/2020 1848   ALT 10 10/08/2020 1848   ALKPHOS 62 10/08/2020 1848   BILITOT 0.6 10/08/2020 1848   BILITOT 0.7 09/26/2019 1011   GFRNONAA >60 10/20/2020 0430   GFRAA 89 09/26/2019 1011     No results found.     Assessment & Plan:   1. Lymphedema The patient has been living in an assisted living facility and has not been able to engage in many of the conservative treatment therapies for lymphedema.  We have sent instruction to the facility to place the patient in wraps to change weekly as well as to allow her to use her lymphedema pump.  She should be able to utilize this pump at least 30 minutes to an hour daily.  We will have the patient return in approximately 6 weeks to reevaluate.  2. Hypertension associated with diabetes ([place]) Continue antihypertensive medications as already ordered, these medications have been reviewed and there are no changes at this time.    Current Outpatient Medications on File Prior to Visit  Medication Sig Dispense Refill   alum & mag hydroxide-simeth (MAALOX/MYLANTA) 200-200-20 MG/5ML suspension Take 30 mLs by mouth every 4 (four) hours as needed for indigestion. 355 mL 0   Ascorbic Acid (VITAMIN C) 1000 MG tablet Take 2,000 mg by mouth daily.     aspirin 325 MG EC tablet Take 325 mg by mouth daily.     bisoprolol (ZEBETA) 5 MG tablet Take 5 mg by mouth in the morning and at bedtime.     Coenzyme Q10 50 MG TABS Take 10 mg by mouth daily.     docusate sodium (COLACE) 100 MG capsule Take 100 mg by mouth daily as needed for mild  constipation.     estradiol (ESTRACE) 0.1 MG/GM vaginal cream APPLY 0.5MG  (PEA SIZED AMOUNT) JUST INSIDE THE VAGINA WITH FINGER-TIP ON MONDAY, WEDNESDAY AND FRIDAY NIGHTS. 10.6 g 0   folic acid (FOLVITE) 1 MG tablet Take 1 tablet (1 mg total) by mouth daily.     furosemide (LASIX) 20 MG tablet Take 1 tablet (20 mg total) by  mouth 2 (two) times daily. 60 tablet 5   glucose blood (ONE TOUCH ULTRA TEST) test strip Use to test blood sugar once daily E11.49 100 each 1   insulin aspart (NOVOLOG) 100 UNIT/ML injection Inject 0-15 Units into the skin 4 (four) times daily -  before meals and at bedtime. 10 mL 11   insulin glargine (LANTUS) 100 UNIT/ML injection Inject 0.1 mLs (10 Units total) into the skin daily. 10 mL 11   Lactobacillus (ACIDOPHILUS PO) Take 175 mg by mouth daily.     levothyroxine (SYNTHROID, LEVOTHROID) 50 MCG tablet Take 50 mcg by mouth daily before breakfast.     magnesium hydroxide (MILK OF MAGNESIA) 400 MG/5ML suspension Take 30 mLs by mouth daily as needed for mild constipation. 355 mL 0   Magnesium Oxide 250 MG TABS Take 250 mg by mouth daily.     metFORMIN (GLUCOPHAGE-XR) 500 MG 24 hr tablet Take 500 mg by mouth 2 (two) times daily.      Multiple Vitamins-Minerals (MULTIVITAMIN GUMMIES ADULT PO) Take 1 each by mouth daily.     naproxen sodium (ALEVE) 220 MG tablet Take 220 mg by mouth daily as needed.     Olopatadine HCl 0.2 % SOLN Place 1 drop into both eyes daily as needed (Allergies).     Omega-3 1000 MG CAPS Take 1,000 mg by mouth 2 (two) times daily.     ondansetron (ZOFRAN) 4 MG tablet Take 1 tablet (4 mg total) by mouth every 6 (six) hours as needed for nausea. 20 tablet 0   polyethylene glycol (MIRALAX / GLYCOLAX) 17 g packet Take 17 g by mouth daily. 14 each 0   psyllium (METAMUCIL) 58.6 % packet Take 1 packet by mouth daily as needed (constipation).     pyridOXINE (B-6) 50 MG tablet Take 1 tablet (50 mg total) by mouth daily.     simethicone (MYLICON) 80 MG chewable  tablet Chew 80 mg by mouth every 6 (six) hours as needed for flatulence.     Skin Protectants, Misc. (DIMETHICONE-ZINC OXIDE) cream Apply topically 2 (two) times daily as needed for dry skin. Please label the bottle for the vaginal area only. 142 g 0   TRADJENTA 5 MG TABS tablet Take 5 mg by mouth daily.     traMADol (ULTRAM) 50 MG tablet Take by mouth every 8 (eight) hours as needed.     vitamin B-12 (CYANOCOBALAMIN) 1000 MCG tablet Take 1,000 mcg by mouth daily. W/ Folate and B6 sublingual     vitamin B-12 1000 MCG tablet Take 1 tablet (1,000 mcg total) by mouth daily.     Probiotic Product (ALIGN) 4 MG CAPS Take 4 mg by mouth daily. (Patient not taking: Reported on 01/20/2021)     senna (SENOKOT) 8.6 MG TABS tablet Take 1 tablet (8.6 mg total) by mouth 2 (two) times daily. (Patient not taking: Reported on 01/20/2021) 120 tablet 0   No current facility-administered medications on file prior to visit.    There are no Patient Instructions on file for this visit. No follow-ups on file.   Kris Hartmann, NP

## 2021-06-21 ENCOUNTER — Ambulatory Visit (INDEPENDENT_AMBULATORY_CARE_PROVIDER_SITE_OTHER): Payer: Medicare HMO

## 2021-06-21 VITALS — Ht 63.0 in | Wt 137.0 lb

## 2021-06-21 DIAGNOSIS — Z0000 Encounter for general adult medical examination without abnormal findings: Secondary | ICD-10-CM | POA: Diagnosis not present

## 2021-06-21 NOTE — Patient Instructions (Addendum)
°  Jodi Reeves , Thank you for taking time to come for your Medicare Wellness Visit. I appreciate your ongoing commitment to your health goals. Please review the following plan we discussed and let me know if I can assist you in the future.   These are the goals we discussed:  Goals       Patient Stated     Medication Monitoring (pt-stated)      Patient Goals/Self-Care Activities Over the next 90 days, patient will:  - take medications as prescribed check blood glucose daily, document, and provide at future appointments check blood pressure daily, document, and provide at future appointments collaborate with provider on medication access solutions      Other     Follow up with Primary Care Provider      As needed.        This is a list of the screening recommended for you and due dates:  Health Maintenance  Topic Date Due   Eye exam for diabetics  06/21/2021*   COVID-19 Vaccine (4 - Booster for Pfizer series) 07/07/2021*   Urine Protein Check  07/19/2021*   Flu Shot  07/22/2021*   Hemoglobin A1C  07/22/2021*   Complete foot exam   08/22/2021*   Zoster (Shingles) Vaccine (1 of 2) 09/18/2021*   Tetanus Vaccine  04/10/2028   Pneumonia Vaccine  Completed   DEXA scan (bone density measurement)  Completed   HPV Vaccine  Aged Out  *Topic was postponed. The date shown is not the original due date.

## 2021-06-21 NOTE — Progress Notes (Signed)
Subjective:   Jodi Reeves is a 88 y.o. female who presents for Medicare Annual (Subsequent) preventive examination.  Review of Systems    No ROS.  Medicare Wellness Virtual Visit.  Visual/audio telehealth visit, UTA vital signs.   See social history for additional risk factors.   Cardiac Risk Factors include: advanced age (>59men, >77 women)     Objective:    Today's Vitals   06/21/21 1407  Weight: 137 lb (62.1 kg)  Height: 5\' 3"  (1.6 m)   Body mass index is 24.27 kg/m.  Advanced Directives 06/21/2021 10/10/2020 10/10/2020 10/08/2020 07/05/2020 06/18/2020 05/18/2020  Does Patient Have a Medical Advance Directive? Yes No No No Yes Yes Yes  Type of Advance Directive [place];Living will [place];Living will [place];Living will  Does patient want to make changes to medical advance directive? No - Patient declined - - - No - Patient declined No - Patient declined -  Copy of [place] in Chart? No - copy requested - - - No - copy requested No - copy requested No - copy requested  Would patient like information on creating a medical advance directive? - No - Patient declined No - Patient declined - - - -    Current Medications (verified) Outpatient Encounter Medications as of 06/21/2021  Medication Sig   alum & mag hydroxide-simeth (MAALOX/MYLANTA) 200-200-20 MG/5ML suspension Take 30 mLs by mouth every 4 (four) hours as needed for indigestion.   Ascorbic Acid (VITAMIN C) 1000 MG tablet Take 2,000 mg by mouth daily.   aspirin 325 MG EC tablet Take 325 mg by mouth daily.   bisoprolol (ZEBETA) 5 MG tablet Take 5 mg by mouth in the morning and at bedtime.   Coenzyme Q10 50 MG TABS Take 10 mg by mouth daily.   docusate sodium (COLACE) 100 MG capsule Take 100 mg by mouth daily as needed for mild constipation.   estradiol (ESTRACE) 0.1 MG/GM vaginal cream APPLY 0.5MG  (PEA  SIZED AMOUNT) JUST INSIDE THE VAGINA WITH FINGER-TIP ON MONDAY, WEDNESDAY AND FRIDAY NIGHTS.   folic acid (FOLVITE) 1 MG tablet Take 1 tablet (1 mg total) by mouth daily.   furosemide (LASIX) 20 MG tablet Take 1 tablet (20 mg total) by mouth 2 (two) times daily.   glucose blood (ONE TOUCH ULTRA TEST) test strip Use to test blood sugar once daily E11.49   insulin aspart (NOVOLOG) 100 UNIT/ML injection Inject 0-15 Units into the skin 4 (four) times daily -  before meals and at bedtime.   insulin glargine (LANTUS) 100 UNIT/ML injection Inject 0.1 mLs (10 Units total) into the skin daily.   Lactobacillus (ACIDOPHILUS PO) Take 175 mg by mouth daily.   levothyroxine (SYNTHROID, LEVOTHROID) 50 MCG tablet Take 50 mcg by mouth daily before breakfast.   magnesium hydroxide (MILK OF MAGNESIA) 400 MG/5ML suspension Take 30 mLs by mouth daily as needed for mild constipation.   Magnesium Oxide 250 MG TABS Take 250 mg by mouth daily.   metFORMIN (GLUCOPHAGE-XR) 500 MG 24 hr tablet Take 500 mg by mouth 2 (two) times daily.    Multiple Vitamins-Minerals (MULTIVITAMIN GUMMIES ADULT PO) Take 1 each by mouth daily.   naproxen sodium (ALEVE) 220 MG tablet Take 220 mg by mouth daily as needed.   Olopatadine HCl 0.2 % SOLN Place 1 drop into both eyes daily as needed (Allergies).   Omega-3 1000 MG CAPS Take 1,000  mg by mouth 2 (two) times daily.   ondansetron (ZOFRAN) 4 MG tablet Take 1 tablet (4 mg total) by mouth every 6 (six) hours as needed for nausea.   polyethylene glycol (MIRALAX / GLYCOLAX) 17 g packet Take 17 g by mouth daily.   Probiotic Product (ALIGN) 4 MG CAPS Take 4 mg by mouth daily. (Patient not taking: Reported on 01/20/2021)   psyllium (METAMUCIL) 58.6 % packet Take 1 packet by mouth daily as needed (constipation).   pyridOXINE (B-6) 50 MG tablet Take 1 tablet (50 mg total) by mouth daily.   senna (SENOKOT) 8.6 MG TABS tablet Take 1 tablet (8.6 mg total) by mouth 2 (two) times daily. (Patient not  taking: Reported on 01/20/2021)   simethicone (MYLICON) 80 MG chewable tablet Chew 80 mg by mouth every 6 (six) hours as needed for flatulence.   Skin Protectants, Misc. (DIMETHICONE-ZINC OXIDE) cream Apply topically 2 (two) times daily as needed for dry skin. Please label the bottle for the vaginal area only.   TRADJENTA 5 MG TABS tablet Take 5 mg by mouth daily.   traMADol (ULTRAM) 50 MG tablet Take by mouth every 8 (eight) hours as needed. (Patient not taking: Reported on 06/21/2021)   vitamin B-12 (CYANOCOBALAMIN) 1000 MCG tablet Take 1,000 mcg by mouth daily. W/ Folate and B6 sublingual   vitamin B-12 1000 MCG tablet Take 1 tablet (1,000 mcg total) by mouth daily.   No facility-administered encounter medications on file as of 06/21/2021.    Allergies (verified) Amiodarone, Fosamax [alendronate], Penicillins, Sulfa antibiotics, Actos [pioglitazone], Amaryl [glimepiride], Atorvastatin, Bentyl [dicyclomine hcl], Ciprofloxacin, Codeine, Ezetimibe-simvastatin, Lovastatin, Macrobid [nitrofurantoin macrocrystal], Protonix [pantoprazole sodium], Rosiglitazone maleate, Statins, Victoza [liraglutide], Alendronate sodium, and Bactrim [sulfamethoxazole-trimethoprim]   History: Past Medical History:  Diagnosis Date   Allergy    Anxiety    Balance disorder    Chronic diastolic CHF (congestive heart failure) ([place])    a. 10/2018 Echo: EF 55-60%, diast dysfxn, nl RV fxn. RVSP 45.22mmHg. Mild-mod TR. Mod dil LA.   Colon polyps    Coronary artery disease, non-occlusive    a. LHC 11/2003: 10% LAD stenosis, 20% LCx stenosis, and 40% RCA stenosis; b. nuc stress test 12/15: small region of mild ischemia in the apical region with WMA also noted in the apical region, EF 60%. 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. since 2010   History of chicken pox    History of eating disorder    HLD (hyperlipidemia)    HTN (hypertension)    Hypothyroidism    IBS (irritable bowel syndrome)    diarrhea    Incontinence of bowel    Osteopenia    Osteoporosis    Ovarian cancer ([place])    1980   Persistent atrial fibrillation ([place])    a. CHADS2VASc = > 8 (CHF, HTN, age x 2, DM, TIA x 2, female); b. on Xarelto   SBO (small bowel obstruction) ([place])    1998   TIA (transient ischemic attack)    UTI (urinary tract infection)    Venous insufficiency    Ventral hernia    Past Surgical History:  Procedure Laterality Date   2nd  look laparotomy  1982   ABDOMINAL HYSTERECTOMY     for ovarian cancer s/p hysterectomy total 1976 and exp lap [place]  8/05   neg; a fib found    CARPAL TUNNEL RELEASE Left 03/06/2018   Procedure: CARPAL TUNNEL RELEASE ENDOSCOPIC;  Surgeon: Corky Mull, MD;  Location: [facility];  Service: Orthopedics;  Laterality: Left;  diabetic - oral meds   CARPAL TUNNEL RELEASE     left CTS Dr.Poggi   cataract OD  2002   cataract surgery  5/07   R   CHOLECYSTECTOMY  1986   DEXA  9/04 and 1/02   EGD/dilation/colon  1/06   ESOPHAGOGASTRODUODENOSCOPY (EGD) WITH PROPOFOL N/A 05/16/2016   Procedure: ESOPHAGOGASTRODUODENOSCOPY (EGD) WITH PROPOFOL with dilation;  Surgeon: Lucilla Lame, MD;  Location: [facility];  Service: Endoscopy;  Laterality: N/A;   FEMORAL HERNIA REPAIR  1958   R   fracture L elbow/wrist  2000   INTRAMEDULLARY (IM) NAIL INTERTROCHANTERIC Left 10/12/2020   Procedure: INTRAMEDULLARY (IM) NAIL INTERTROCHANTRIC;  Surgeon: Thornton Park, MD;  Location: [facility];  Service: Orthopedics;  Laterality: Left;   intussception/obstruction  12/98   JOINT REPLACEMENT     left shoulder   kidney stone x2  1991   KYPHOPLASTY N/A  04/13/2020   Procedure: [id number];  Surgeon: Hessie Knows, MD;  Location: [facility];  Service: Orthopedics;  Laterality: N/A;   KYPHOPLASTY N/A 05/18/2020   Procedure: L1 KYPHOPLASTY;  Surgeon: Hessie Knows, MD;  Location: [facility];  Service: Orthopedics;  Laterality: N/A;   KYPHOPLASTY N/A 07/06/2020   Procedure: L4  KYPHOPLASTY;  Surgeon: Hessie Knows, MD;  Location: [facility];  Service: Orthopedics;  Laterality: N/A;   laminectomy L4-5  1971   LUMBAR LAMINECTOMY     1970 ruptured disc    MOUTH SURGERY     myoview stress  8/05   syncope (-)   REVERSE SHOULDER ARTHROPLASTY Left 10/06/2014   Procedure: REVERSE SHOULDER ARTHROPLASTY;  Surgeon: Corky Mull, MD;  Location: [facility];  Service: Orthopedics;  Laterality: Left;   stillbirth  1969   TOTAL ABDOMINAL HYSTERECTOMY W/ BILATERAL SALPINGOOPHORECTOMY  1980   ovarian cancer   WRIST FRACTURE SURGERY  11/05   R   Family History  Problem Relation Age of Onset   Early death Father    Diabetes Mother    Heart disease Mother    Arthritis Brother    Cancer Brother    Depression Brother    Hearing loss Brother    Arthritis Brother    Cancer Brother        colon   Hearing loss Brother    Cancer Brother    Diabetes Brother    Hearing loss Brother    Heart disease Brother    Hyperlipidemia Brother    Heart disease Sister    Hypertension Sister    Stroke Sister    Hearing loss Daughter    Hypertension Daughter    Diabetes Paternal Grandfather    Hearing loss Sister    Heart disease Sister    Arthritis Sister    Depression Sister    Hearing loss Sister    Heart disease Sister    COPD Brother    Early death Brother    Early death Brother    Social History   Socioeconomic History   Marital status: Widowed    Spouse name: Not on file  Number of children: 1   Years of education: Not on file   Highest education level: Not on file  Occupational History   Occupation: retired Therapist, 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  Tobacco Use   Smoking  status: Never   Smokeless tobacco: Never  Vaping Use   Vaping Use: Never used  Substance and Sexual Activity   Alcohol use: No    Comment: may have occasional glass of wine   Drug use: No   Sexual activity: Not Currently  Other Topics Concern   Not on file  Social History Narrative   Has living will   Son is healthcare POA    also has grand daughter and great grand son   Would accept CPR but not prolonged artificial ventilation    No feeding tube if cognitively unaware   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)   2 pregnancies 1 stillborn    No longer drives    Resident at [facility]   Financial Resource Strain: Low Risk    Difficulty of Paying Living Expenses: Not very hard  Food Insecurity: No Food Insecurity   Worried About Charity fundraiser in the Last Year: Never true   Arboriculturist in the Last Year: Never true  Transportation Needs: No Transportation Needs   Lack of Transportation (Medical): No   Lack of Transportation (Non-Medical): No  Physical Activity: Insufficiently Active   Days of Exercise per Week: 3 days   Minutes of Exercise per Session: 20 min  Stress: No Stress Concern Present   Feeling of Stress : Not at all  Social Connections: Unknown   Frequency of Communication with Friends and Family: More than three times a week   Frequency of Social Gatherings with Friends and Family: Once a week   Attends Religious Services: Not on Electrical engineer or Organizations: Not on file   Attends Archivist Meetings: Not on file   Marital Status: Not on file    Tobacco Counseling Counseling given: Not Answered   Clinical Intake:  Pre-visit preparation completed: Yes        Diabetes: Yes  How often do you need to have someone help you when you read instructions, pamphlets, or other written materials from your doctor or pharmacy?: 4 - Often   Interpreter Needed?: No      Activities of Daily Living In your  present state of health, do you have any difficulty performing the following activities: 06/21/2021 10/10/2020  Hearing? N N  Vision? N N  Difficulty concentrating or making decisions? N Y  Comment Age appropriate -  Walking or climbing stairs? Tempie Donning  Comment Wheelchair assist -  Dressing or bathing? Tempie Donning  Comment Staff assist -  Doing errands, shopping? Tempie Donning  Comment Transportation assist with resident facility -  Conservation officer, nature and eating ? N -  Using the Toilet? N -  In the past six months, have you accidently leaked urine? Y -  Do you have problems with loss of bowel control? Y -  Managing your Medications? Y -  Managing your Finances? Y -  Housekeeping or managing your Housekeeping? Y -  Some recent data might be hidden    Patient Care Team: [name], Nino Glow, MD as PCP - General (Internal Medicine) Minna Merritts, MD as PCP - Cardiology (Cardiology) Minna Merritts, MD as Consulting Physician (Cardiology) De Hollingshead, RPH-CPP (Pharmacist)  Indicate any recent Medical Services  you may have received from other than [facility] providers in the past year (date may be approximate).     Assessment:   This is a routine wellness examination for Karysa.  Virtual Visit via Telephone Note  I connected with  PIERA DOWNS on 06/21/21 at  2:00 PM EST by telephone and verified that I am speaking with the correct person using two identifiers.  Persons participating in the virtual visit: patient/Nurse Health Advisor   I discussed the limitations, risks, security and privacy concerns of performing an evaluation and management service by telephone and the availability of in person appointments. The patient expressed understanding and agreed to proceed.  Interactive audio and video telecommunications were attempted between this nurse and patient, however failed, due to patient having technical difficulties OR patient did not have access to video capability.  We continued and  completed visit with audio only.  Some vital signs may be absent or patient reported.   Hearing/Vision screen Hearing Screening - Comments:: Patient is able to hear conversational tones without difficulty. No issues reported. Vision Screening - Comments:: Wears corrective lenses Followed by [facility]   Dietary issues and exercise activities discussed: Current Exercise Habits: Home exercise routine, Intensity: Mild Healthy diet Good fluid intake   Goals Addressed             This Visit's Progress    Follow up with Primary Care Provider       As needed.       Depression Screen PHQ 2/9 Scores 06/21/2021 06/18/2020 04/22/2019 12/26/2018 10/05/2017 02/17/2013  PHQ - 2 Score 0 0 0 0 0 0    Fall Risk Fall Risk  06/21/2021 06/18/2020 03/10/2020 08/21/2019 04/22/2019  Falls in the past year? 0 1 0 0 0  Number falls in past yr: - 0 0 0 -  Injury with Fall? - 0 0 0 -  Risk Factor Category  - - - - -  Risk for fall due to : - Impaired balance/gait - Impaired balance/gait;Impaired mobility -  Follow up Falls evaluation completed Falls evaluation completed Falls evaluation completed Falls evaluation completed Falls prevention discussed    FALL RISK PREVENTION PERTAINING TO THE HOME: Home free of loose throw rugs in walkways, pet beds, electrical cords, etc? Yes  Adequate lighting in your home to reduce risk of falls? Yes   ASSISTIVE DEVICES UTILIZED TO PREVENT FALLS: Life alert? No  Use of a cane, walker or w/c? Yes  Grab bars in the bathroom? Yes   TIMED UP AND GO: Was the test performed? No .   Cognitive Function:  Patient is alert and oriented x3    6CIT Screen 04/22/2019  What Year? 0 points  What month? 0 points  What time? 0 points  Count back from 20 0 points  Months in reverse 0 points  Repeat phrase 0 points  Total Score 0    Immunizations Immunization History  Administered Date(s) Administered   Fluad Quad(high Dose 65+) 01/23/2019   Influenza Split  01/24/2012   Influenza Whole 04/25/2003, 01/16/2008   Influenza, High Dose Seasonal PF 03/04/2020   Influenza,inj,Quad PF,6+ Mos 03/29/2017   Influenza,inj,quad, With Preservative 02/18/2018   Influenza-Unspecified 01/30/2014, 01/23/2016, 02/07/2017, 03/29/2017, 02/13/2018   PFIZER(Purple Top)SARS-COV-2 Vaccination 05/13/2019, 06/07/2019, 02/02/2020   PPD Test 05/22/2017   Pneumococcal Conjugate-13 03/03/2016   Pneumococcal Polysaccharide-23 04/25/2003   Td 03/30/2004, 04/24/2004   Tdap 02/20/2008, 04/10/2018   Tetanus 03/30/2004   Zoster, Live 02/05/2008   Flu vaccine- notes  given 12/2021. Agrees to update immunization record .   Urine microalbumin- deferred  A1C hemoglobin-deferred  Shingrix Completed?: No.    Education has been provided regarding the importance of this vaccine. Patient has been advised to call insurance company to determine out of pocket expense if they have not yet received this vaccine. Advised may also receive vaccine at local pharmacy or Health Dept. Verbalized acceptance and understanding.  Screening Tests Health Maintenance  Topic Date Due   OPHTHALMOLOGY EXAM  06/21/2021 (Originally 03/09/2021)   COVID-19 Vaccine (4 - Booster for Pfizer series) 07/07/2021 (Originally 03/29/2020)   URINE MICROALBUMIN  07/19/2021 (Originally 03/10/2021)   INFLUENZA VACCINE  07/22/2021 (Originally 11/22/2020)   HEMOGLOBIN A1C  07/22/2021 (Originally 04/11/2021)   FOOT EXAM  08/22/2021 (Originally 10/07/2019)   Zoster Vaccines- Shingrix (1 of 2) 09/18/2021 (Originally 07/26/1946)   TETANUS/TDAP  04/10/2028   Pneumonia Vaccine 78+ Years old  Completed   DEXA SCAN  Completed   HPV VACCINES  Aged Out   Health Maintenance There are no preventive care reminders to display for this patient.  Lung Cancer Screening: (Low Dose CT Chest recommended if Age 11-80 years, 30 pack-year currently smoking OR have quit w/in 15years.) does not qualify.   Hepatitis C Screening: does not  qualify.  Vision Screening: Recommended annual ophthalmology exams for early detection of glaucoma and other disorders of the eye.  Dental Screening: Recommended annual dental exams for proper oral hygiene  Community Resource Referral / Chronic Care Management: CRR required this visit?  No   CCM required this visit?  No      Plan:   Keep all routine maintenance appointments.   I have personally reviewed and noted the following in the patients chart:   Medical and social history Use of alcohol, tobacco or illicit drugs  Current medications and supplements including opioid prescriptions. Not taking opioid.  Functional ability and status Nutritional status Physical activity Advanced directives List of other physicians Hospitalizations, surgeries, and ER visits in previous 12 months Vitals Screenings to include cognitive, depression, and falls Referrals and appointments  In addition, I have reviewed and discussed with patient certain preventive protocols, quality metrics, and best practice recommendations. A written personalized care plan for preventive services as well as general preventive health recommendations were provided to patient. Resident at [facility]. Agrees to schedule annual with PCP at later date.      Varney Biles, LPN   12/01/9831

## 2021-06-30 ENCOUNTER — Ambulatory Visit: Payer: Self-pay | Admitting: Pharmacist

## 2021-06-30 NOTE — Chronic Care Management (AMB) (Signed)
?  Chronic Care Management  ? ?Note ? ?06/30/2021 ?Name: ABIGAILE ROSSIE MRN: [id number] DOB: 10/18/1927 ? ? ? ?Closing pharmacy CCM case at this time.  Patient has clinic contact information for future questions or concerns.  ? ?Catie Darnelle Maffucci, PharmD, [place], CPP ?Clinical Pharmacist ?Therapist, music at [facility] [employer] [facility] ?[phone] ? ?

## 2021-07-14 ENCOUNTER — Other Ambulatory Visit: Payer: Self-pay

## 2021-07-14 ENCOUNTER — Encounter (INDEPENDENT_AMBULATORY_CARE_PROVIDER_SITE_OTHER): Payer: Self-pay | Admitting: Nurse Practitioner

## 2021-07-14 ENCOUNTER — Ambulatory Visit (INDEPENDENT_AMBULATORY_CARE_PROVIDER_SITE_OTHER): Payer: Medicare HMO | Admitting: Nurse Practitioner

## 2021-07-14 VITALS — BP 150/77 | HR 87 | Resp 16

## 2021-07-14 DIAGNOSIS — E1142 Type 2 diabetes mellitus with diabetic polyneuropathy: Secondary | ICD-10-CM

## 2021-07-14 DIAGNOSIS — I10 Essential (primary) hypertension: Secondary | ICD-10-CM | POA: Diagnosis not present

## 2021-07-14 DIAGNOSIS — I890 Lymphedema, not elsewhere classified: Secondary | ICD-10-CM | POA: Diagnosis not present

## 2021-07-18 ENCOUNTER — Encounter (INDEPENDENT_AMBULATORY_CARE_PROVIDER_SITE_OTHER): Payer: Self-pay | Admitting: Nurse Practitioner

## 2021-07-18 NOTE — Progress Notes (Signed)
? ?Subjective:  ? ? Patient ID: Jodi Reeves, female    DOB: Feb 14, 1928, 88 y.o.   MRN: [id number] ?Chief Complaint  ?Patient presents with  ? Follow-up  ?  Pt states that her legs need unnaboots and the nursing home is not doing them  ? ? ?[name] is a 88 year old female that presents today for follow-up evaluation of her lower extremity edema.  She was placed in wraps the last office visit however it is a discrepancy as to where they were placed orders not replaced with any compression at all.  She notes that she has significant cellulitis but that is resolved at this point.  She has not been wearing any compression.  She is also not been utilizing her lymphedema pump although it has been delivered to the facility.  She notes that due to the staffing at the facility much of the conservative tactics are difficult for her. ? ? ?Review of Systems  ?Cardiovascular:  Positive for leg swelling.  ?All other systems reviewed and are negative. ? ?   ?Objective:  ? Physical Exam ?Vitals reviewed.  ?HENT:  ?   Head: Normocephalic.  ?Cardiovascular:  ?   Rate and Rhythm: Normal rate.  ?Pulmonary:  ?   Effort: Pulmonary effort is normal.  ?Musculoskeletal:  ?   Right lower leg: 2+ Edema present.  ?   Left lower leg: 3+ Edema present.  ?Skin: ?   General: Skin is warm and dry.  ?Neurological:  ?   Mental Status: She is alert and oriented to person, place, and time.  ?Psychiatric:     ?   Mood and Affect: Mood normal.     ?   Behavior: Behavior normal.     ?   Thought Content: Thought content normal.     ?   Judgment: Judgment normal.  ? ? ?BP (!) 150/77 (BP Location: Right Arm)   Pulse 87   Resp 16  ? ?Past Medical History:  ?Diagnosis Date  ? Allergy   ? Anxiety   ? Balance disorder   ? Chronic diastolic CHF (congestive heart failure) ([place])   ? a. 10/2018 Echo: EF 55-60%, diast dysfxn, nl RV fxn. RVSP 45.54mHg. Mild-mod TR. Mod dil LA.  ? Colon polyps   ? Coronary artery disease, non-occlusive   ? a. LHC 11/2003: 10% LAD  stenosis, 20% LCx stenosis, and 40% RCA stenosis; b. nuc stress test 12/15: small region of mild ischemia in the apical region with WMA also noted in the apical region, EF 60%. She declined invasive evaluation at that time  ? DM2 (diabetes mellitus, type 2) ([place]   ? Fall   ? 04/2017 [facility] multiple fractures, brusing   ? Falls frequently   ? h/o left shoudler/wrist fracture and left fingers numb  ? GERD (gastroesophageal reflux disease)   ? esophageal web  ? Granuloma annulare   ? Dr. PSharlett Iles since 2010  ? History of chicken pox   ? History of eating disorder   ? HLD (hyperlipidemia)   ? HTN (hypertension)   ? Hypothyroidism   ? IBS (irritable bowel syndrome)   ? diarrhea   ? Incontinence of bowel   ? Osteopenia   ? Osteoporosis   ? Ovarian cancer ([facility]   ? 1980  ? Persistent atrial fibrillation ([facility]   ? a. CHADS2VASc = > 8 (CHF, HTN, age x 2, DM, TIA x 2, female); b. on Xarelto  ? SBO (small bowel obstruction) ([place]   ?  1998  ? TIA (transient ischemic attack)   ? UTI (urinary tract infection)   ? Venous insufficiency   ? Ventral hernia   ? ? ?Social History  ? ?Socioeconomic History  ? Marital status: Widowed  ?  Spouse name: Not on file  ? Number of children: 1  ? Years of education: Not on file  ? Highest education level: Not on file  ?Occupational History  ? 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 Smoking status: Never  ? Smokeless tobacco: Never  ?Vaping Use  ? Vaping Use: Never used  ?Substance and Sexual Activity  ? Alcohol use: No  ?  Comment: may have occasional glass of wine  ? Drug use: No  ? Sexual activity: Not Currently  ?Other Topics Concern  ? Not on file  ?Social History Narrative  ? Has living will  ? Son is healthcare POA   ? also has grand daughter and great grand son  ? Would accept CPR but not prolonged artificial ventilation   ? No feeding tube if cognitively unaware  ? 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 2 pregnancies 1 stillborn   ? No longer drives   ? Resident at [facility]  ? ?Social Determinants of Health  ? ?Financial Resource Strain: Low Risk   ? Difficulty of Paying Living Expenses: Not very hard  ?Food Insecurity: No Food Insecurity  ? Worried About Charity fundraiser in the Last Year: Never true  ? Ran Out of Food in the Last Year: Never true  ?Transportation Needs: No Transportation Needs  ? Lack of Transportation (Medical): No  ? Lack of Transportation (Non-Medical): No  ?Physical Activity: Not on file  ?Stress: No Stress Concern Present  ? Feeling of Stress : Not at all  ?Social Connections: Unknown  ? Frequency of Communication with Friends and Family: More than three times a week  ? Frequency of Social Gatherings with Friends and Family: Once a week  ? Attends Religious Services: Not on file  ? Active Member of Clubs or Organizations: Not on file  ? Attends Archivist Meetings: Not on file  ? Marital Status: Not on file  ?Intimate Partner Violence: Not At Risk  ? Fear of Current or Ex-Partner: No  ? Emotionally Abused: No  ? Physically Abused: No  ? Sexually Abused: No  ? ? ?Past Surgical History:  ?Procedure Laterality Date  ? 2nd look laparotomy  1982  ? ABDOMINAL HYSTERECTOMY    ? for ovarian cancer s/p hysterectomy total 1976 and exp lap 1980  ? APPENDECTOMY    ? BACK SURGERY    ? CARDIAC CATHETERIZATION  8/05  ? neg; a fib found   ? CARPAL TUNNEL RELEASE Left 03/06/2018  ? Procedure: CARPAL TUNNEL RELEASE ENDOSCOPIC;  Surgeon: Corky Mull, MD;  Location: [place];  Service: Orthopedics;  Laterality: Left;  diabetic - oral meds  ? CARPAL TUNNEL RELEASE    ? left CTS Dr.Poggi  ? cataract OD  2002  ? cataract surgery  5/07  ? R  ? CHOLECYSTECTOMY  1986  ? DEXA  9/04 and 1/02  ? EGD/dilation/colon  1/06  ? ESOPHAGOGASTRODUODENOSCOPY (EGD) WITH PROPOFOL N/A 05/16/2016  ? Procedure: ESOPHAGOGASTRODUODENOSCOPY (EGD) WITH PROPOFOL with dilation;  Surgeon: Lucilla Lame, MD;  Location: [facility];  Service: Endoscopy;  Laterality: N/A;  ? [place]  ? R  ? fracture L elbow/wrist  2000  ? INTRAMEDULLARY (  IM) NAIL INTERTROCHANTERIC Left 10/12/2020  ? Procedure: INTRAMEDULLARY (IM) NAIL INTERTROCHANTRIC;  Surgeon: Thornton Park, MD;  Location: [facility];  Service: Orthopedics;  Laterality: Left;  ? intussception/obstruction  12/98  ? JOINT REPLACEMENT    ? left shoulder  ? kidney stone x2  1991  ? KYPHOPLASTY N/A 04/13/2020  ? Procedure: [id number];  Surgeon: Hessie Knows, MD;  Location: [facility];  Service: Orthopedics;  Laterality: N/A;  ? KYPHOPLASTY N/A 05/18/2020  ? Procedure: L1 KYPHOPLASTY;  Surgeon: Hessie Knows, MD;  Location: [facility];  Service: Orthopedics;  Laterality: N/A;  ? KYPHOPLASTY N/A 07/06/2020  ? Procedure: L4  KYPHOPLASTY;  Surgeon: Hessie Knows, MD;  Location: [facility];  Service: Orthopedics;  Laterality: N/A;  ? laminectomy L4-5  1971  ? LUMBAR LAMINECTOMY    ? 1970 ruptured disc   ? MOUTH SURGERY    ? myoview stress  8/05  ? syncope (-)  ? REVERSE SHOULDER ARTHROPLASTY Left 10/06/2014  ? Procedure: REVERSE SHOULDER ARTHROPLASTY;  Surgeon: Corky Mull, MD;  Location: [facility];  Service: Orthopedics;  Laterality: Left;  ? stillbirth  1969  ? TOTAL ABDOMINAL HYSTERECTOMY W/ BILATERAL SALPINGOOPHORECTOMY  1980  ? ovarian cancer  ? WRIST FRACTURE SURGERY  11/05  ? R  ? ? ?Family History  ?Problem Relation Age of Onset  ? Early death Father   ? Diabetes Mother   ? Heart disease Mother   ? Arthritis Brother   ? Cancer Brother   ? Depression Brother   ? Hearing loss Brother   ? Arthritis Brother   ? Cancer Brother   ?     colon  ? Hearing loss Brother   ? Cancer Brother   ? Diabetes Brother   ? Hearing loss Brother   ? Heart disease Brother   ? Hyperlipidemia Brother   ? Heart disease Sister   ? Hypertension Sister   ? Stroke Sister   ? Hearing loss Daughter   ? Hypertension Daughter   ? Diabetes Paternal Grandfather   ? Hearing loss Sister   ? Heart disease Sister   ? Arthritis Sister   ? Depression Sister   ? Hearing  loss Sister   ? Heart disease Sister   ? COPD Brother   ? Early death Brother   ? Early death Brother   ? ? ?Allergies  ?Allergen Reactions  ? Amiodarone Other (See Comments)  ?  Severe Thyroid issues   ? F

## 2021-08-24 NOTE — Progress Notes (Signed)
? ? ? ? ?MRN : [id number] ? ?Jodi Reeves is a 88 y.o. (December 31, 1927) female who presents with chief complaint of legs swell and ulcers. ? ?History of Present Illness:  ? ?The patient returns to the office for followup evaluation regarding leg swelling.  The patient notes the swelling has persisted and the pain associated with swelling continues. There have not been any interval development of a ulcerations or wounds.  She is at a facility and has has some problems with the staff helping to put on compression and using the pump. ? ?The patient also states elevation during the day and exercise is being done too.  ? ?No outpatient medications have been marked as taking for the 08/25/21 encounter (Appointment) with Delana Meyer, Dolores Lory, MD.  ? ? ?Past Medical History:  ?Diagnosis Date  ? Allergy   ? Anxiety   ? Balance disorder   ? Chronic diastolic CHF (congestive heart failure) ([place])   ? a. 10/2018 Echo: EF 55-60%, diast dysfxn, nl RV fxn. RVSP 45.34mHg. Mild-mod TR. Mod dil LA.  ? Colon polyps   ? Coronary artery disease, non-occlusive   ? a. LHC 11/2003: 10% LAD stenosis, 20% LCx stenosis, and 40% RCA stenosis; b. nuc stress test 12/15: small region of mild ischemia in the apical region with WMA also noted in the apical region, EF 60%. She declined invasive evaluation at that time  ? DM2 (diabetes mellitus, type 2) ([place]   ? Fall   ? 04/2017 see care[facility]where [facility] multiple fractures, brusing   ? Falls frequently   ? h/o left shoudler/wrist fracture and left fingers numb  ? GERD (gastroesophageal reflux disease)   ? esophageal web  ? Granuloma annulare   ? Dr. PSharlett Iles since 2010  ? History of chicken pox   ? History of eating disorder   ? HLD (hyperlipidemia)   ? HTN (hypertension)   ? Hypothyroidism   ? IBS (irritable bowel syndrome)   ? diarrhea   ? Incontinence of bowel   ? Osteopenia   ? Osteoporosis   ? Ovarian cancer ([place]   ? 1980  ? Persistent atrial fibrillation ([facility]   ? a. CHADS2VASc = > 8 (CHF, HTN, age x 2,  DM, TIA x 2, female); b. on Xarelto  ? SBO (small bowel obstruction) ([facility]   ? 1998  ? TIA (transient ischemic attack)   ? UTI (urinary tract infection)   ? Venous insufficiency   ? Ventral hernia   ? ? ?Past Surgical History:  ?Procedure Laterality Date  ? 2nd look laparotomy  1982  ? ABDOMINAL HYSTERECTOMY    ? for ovarian cancer s/p hysterectomy total 1976 and exp lap 1980  ? APPENDECTOMY    ? BACK SURGERY    ? CARDIAC CATHETERIZATION  8/05  ? neg; a fib found   ? CARPAL TUNNEL RELEASE Left 03/06/2018  ? Procedure: CARPAL TUNNEL RELEASE ENDOSCOPIC;  Surgeon: PCorky Mull MD;  [place] [facility]  Service: Orthopedics;  Laterality: Left;  diabetic - oral meds  ? CARPAL TUNNEL RELEASE    ? left CTS Dr.Poggi  ? cataract OD  2002  ? cataract surgery  5/07  ? R  ? CHOLECYSTECTOMY  1986  ? DEXA  9/04 and 1/02  ? EGD/dilation/colon  1/06  ? ESOPHAGOGASTRODUODENOSCOPY (EGD) WITH PROPOFOL N/A 05/16/2016  ? Procedure: ESOPHAGOGASTRODUODENOSCOPY (EGD) WITH PROPOFOL with dilation;  Surgeon: DLucilla Lame MD;  Location: [facility];  Service: Endoscopy;  Laterality: N/A;  ? [facility]  [place]  ? R  ? fracture L elbow/wrist  2000  ? INTRAMEDULLARY (IM) NAIL INTERTROCHANTERIC Left 10/12/2020  ? Procedure: INTRAMEDULLARY (IM) NAIL INTERTROCHANTRIC;  Surgeon: Thornton Park, MD;  Location: [facility];  Service: Orthopedics;  Laterality: Left;  ? intussception/obstruction  12/98  ? JOINT REPLACEMENT    ? left shoulder  ? kidney stone x2  1991  ? KYPHOPLASTY N/A 04/13/2020  ? Procedure: [id number];  Surgeon: Hessie Knows, MD;  Location: [facility];  Service: Orthopedics;  Laterality: N/A;  ? KYPHOPLASTY N/A 05/18/2020  ? Procedure: L1 KYPHOPLASTY;  Surgeon: Hessie Knows, MD;  Location: [facility];  Service: Orthopedics;  Laterality: N/A;  ? KYPHOPLASTY N/A 07/06/2020  ? Procedure: L4  KYPHOPLASTY;  Surgeon: Hessie Knows, MD;  Location: [facility];  Service: Orthopedics;  Laterality: N/A;  ? laminectomy L4-5   1971  ? LUMBAR LAMINECTOMY    ? 1970 ruptured disc   ? MOUTH SURGERY    ? myoview stress  8/05  ? syncope (-)  ? REVERSE SHOULDER ARTHROPLASTY Left 10/06/2014  ? Procedure: REVERSE SHOULDER ARTHROPLASTY;  Surgeon: Corky Mull, MD;  Location: [facility];  Service: Orthopedics;  Laterality: Left;  ? stillbirth  1969  ? TOTAL ABDOMINAL HYSTERECTOMY W/ BILATERAL SALPINGOOPHORECTOMY  1980  ? ovarian cancer  ? WRIST FRACTURE SURGERY  11/05  ? R  ? ? ?Social History ?Social History  ? ?Tobacco Use  ? Smoking status: Never  ? Smokeless tobacco: Never  ?Vaping Use  ? Vaping Use: Never used  ?Substance Use Topics  ? Alcohol use: No  ?  Comment: may have occasional glass of wine  ? Drug use: No  ? ? ?Family History ?Family History  ?Problem Relation Age of Onset  ? Early death Father   ? Diabetes Mother   ? Heart disease Mother   ? Arthritis Brother   ? Cancer Brother   ? Depression Brother   ? Hearing loss Brother   ? Arthritis Brother   ? Cancer Brother   ?     colon  ? Hearing loss Brother   ? Cancer Brother   ? Diabetes Brother   ? Hearing loss Brother   ? Heart disease Brother   ? Hyperlipidemia Brother   ? Heart disease Sister   ? Hypertension Sister   ? Stroke Sister   ? Hearing loss Daughter   ? Hypertension Daughter   ? Diabetes Paternal Grandfather   ? Hearing loss Sister   ? Heart disease Sister   ? Arthritis Sister   ? Depression Sister   ? Hearing loss Sister   ? Heart disease Sister   ? COPD Brother   ? Early death Brother   ? Early death Brother   ? ? ?Allergies  ?Allergen Reactions  ? Amiodarone Other (See Comments)  ?  Severe Thyroid issues   ? Fosamax [Alendronate]   ?  dysphagia  ? Penicillins Anaphylaxis and Other (See Comments)  ?  TOLERATED CEFAZOLIN 07/06/20 ?Has patient had a PCN reaction causing immediate rash, facial/tongue/throat swelling, SOB or lightheadedness with hypotension: Yes ?Has patient had a PCN reaction causing severe rash involving mucus membranes or skin necrosis: No ?Has patient had a PCN  reaction that required hospitalization: No ?Has patient had a PCN reaction occurring within the last 10 years: No ?If all of the above answers are "NO", then may proceed with Cephalosporin use. ?  ? Sulfa Antibiotics Itching and Rash  ?  Itching ?  ?  Actos [Pioglitazone]   ?  Not effective   ? Amaryl [Glimepiride]   ?  Not effective  ?  ? Atorvastatin Other (See Comments)  ?  Muscle aches & All statins per Patient  ? Bentyl [Dicyclomine Hcl]   ?  Could not tolerate nausea, reduced concentration, h/a ?  ? Ciprofloxacin Other (See Comments)  ?  High blood pressure  ?  ? Codeine Nausea And Vomiting  ? Ezetimibe-Simvastatin Other (See Comments)  ?  Muscle aches, nausea, back pain   ? Lovastatin Other (See Comments)  ?  Myalgias  ? Macrobid [Nitrofurantoin Macrocrystal]   ?  Severe Itching, rash ?  ? Protonix [Pantoprazole Sodium]   ?  Esophageal problems  ?Wants removed from list  ? Rosiglitazone Maleate Other (See Comments)  ?  Edema  ? Statins   ?  Muscle and joint aches to all statins  ? Victoza [Liraglutide]   ?  Nausea ?  ? Alendronate Sodium Rash  ?  Dysphagia and ulceration   ? Bactrim [Sulfamethoxazole-Trimethoprim] Rash  ?  itching  ? ? ? ?REVIEW OF SYSTEMS (Negative unless checked) ? ?Constitutional: '[]'$ Weight loss  '[]'$ Fever  '[]'$ Chills ?Cardiac: '[]'$ Chest pain   '[]'$ Chest pressure   '[]'$ Palpitations   '[]'$ Shortness of breath when laying flat   '[]'$ Shortness of breath with exertion. ?Vascular:  '[]'$ Pain in legs with walking   '[x]'$ Pain in legs with standing  '[]'$ History of DVT   '[]'$ Phlebitis   '[x]'$ Swelling in legs   '[]'$ Varicose veins   '[]'$ Non-healing ulcers ?Pulmonary:   '[]'$ Uses home oxygen   '[]'$ Productive cough   '[]'$ Hemoptysis   '[]'$ Wheeze  '[]'$ COPD   '[]'$ Asthma ?Neurologic:  '[]'$ Dizziness   '[]'$ Seizures   '[]'$ History of stroke   '[]'$ History of TIA  '[]'$ Aphasia   '[]'$ Vissual changes   '[]'$ Weakness or numbness in arm   '[]'$ Weakness or numbness in leg ?Musculoskeletal:   '[]'$ Joint swelling   '[]'$ Joint pain   '[]'$ Low back pain ?Hematologic:  '[]'$ Easy bruising  '[]'$ Easy  bleeding   '[]'$ Hypercoagulable state   '[]'$ Anemic ?Gastrointestinal:  '[]'$ Diarrhea   '[]'$ Vomiting  '[]'$ Gastroesophageal reflux/heartburn   '[]'$ Difficulty swallowing. ?Genitourinary:  '[]'$ Chronic kidney disease   [

## 2021-08-25 ENCOUNTER — Ambulatory Visit (INDEPENDENT_AMBULATORY_CARE_PROVIDER_SITE_OTHER): Payer: Medicare HMO | Admitting: Vascular Surgery

## 2021-08-25 ENCOUNTER — Encounter (INDEPENDENT_AMBULATORY_CARE_PROVIDER_SITE_OTHER): Payer: Self-pay | Admitting: Vascular Surgery

## 2021-08-25 VITALS — BP 121/70 | HR 65 | Resp 17

## 2021-08-25 DIAGNOSIS — I10 Essential (primary) hypertension: Secondary | ICD-10-CM

## 2021-08-25 DIAGNOSIS — I872 Venous insufficiency (chronic) (peripheral): Secondary | ICD-10-CM

## 2021-08-25 DIAGNOSIS — I2510 Atherosclerotic heart disease of native coronary artery without angina pectoris: Secondary | ICD-10-CM

## 2021-08-25 DIAGNOSIS — I4821 Permanent atrial fibrillation: Secondary | ICD-10-CM

## 2021-08-25 DIAGNOSIS — L97511 Non-pressure chronic ulcer of other part of right foot limited to breakdown of skin: Secondary | ICD-10-CM

## 2021-12-02 NOTE — Progress Notes (Signed)
MRN : [id number]  Jodi Reeves is a 88 y.o. ([birth year]) female who presents with chief complaint of legs swell.  History of Present Illness:  The patient returns to the office for followup evaluation regarding leg swelling.  The swelling has improved quite a bit and the pain associated with swelling has decreased substantially. There have not been any interval development of a ulcerations or wounds.  Since the previous visit the patient has been wearing graduated compression wraps and has noted some improvement in the lymphedema. The patient has been using compression routinely morning until night.  We finally were able to work this out with the nurses  The patient also states elevation during the day and exercise (such as walking) is being done too.     No outpatient medications have been marked as taking for the 12/05/21 encounter (Appointment) with Delana Meyer, Dolores Lory, MD.    Past Medical History:  Diagnosis Date   Allergy    Anxiety    Balance disorder    Chronic diastolic CHF (congestive heart failure) ([place])    a. 10/2018 Echo: EF 55-60%, diast dysfxn, nl RV fxn. RVSP 45.8mHg. Mild-mod TR. Mod dil LA.   Colon polyps    Coronary artery disease, non-occlusive    a. LHC 11/2003: 10% LAD stenosis, 20% LCx stenosis, and 40% RCA stenosis; b. nuc stress test 12/15: small region of mild ischemia in the apical region with WMA also noted in the apical region, EF 60%. She declined invasive evaluation at that time   DM2 (diabetes mellitus, type 2) ([place]    Fall    04/2017 see careeverywhere [facility] multiple fractures, brusing    Falls frequently    h/o left shoudler/wrist fracture and left fingers numb   GERD (gastroesophageal reflux disease)    esophageal web   Granuloma annulare    Dr. PSharlett Iles since 2010   History of chicken pox    History of eating disorder    HLD (hyperlipidemia)    HTN (hypertension)    Hypothyroidism    IBS (irritable bowel syndrome)    diarrhea     Incontinence of bowel    Osteopenia    Osteoporosis    [place]    [place]   Persistent atrial fibrillation ([place]    a. CHADS2VASc = > 8 (CHF, HTN, age x 2, DM, TIA x 2, female); b. on Xarelto   SBO [place] bowel obstruction) [place]    [place]   TIA (transient ischemic attack)    UTI (urinary tract infection)    Venous insufficiency    Ventral hernia     Past Surgical History:  Procedure Laterality Date   2nd look laparotomy  1982   ABDOMINAL HYSTERECTOMY     for ovarian cancer s/p hysterectomy total 1976 and exp lap [place] 8/05   neg; a fib found    CARPAL TUNNEL RELEASE Left 03/06/2018   Procedure: CARPAL TUNNEL RELEASE ENDOSCOPIC;  Surgeon: PCorky Mull MD;  [place]  Service: Orthopedics;  Laterality: Left;  diabetic - oral meds   CARPAL TUNNEL RELEASE     left CTS Dr.Poggi   cataract OD  2002   cataract surgery  5/07   R   CHOLECYSTECTOMY  1986   DEXA  9/04 and 1/02   EGD/dilation/colon  1/06   ESOPHAGOGASTRODUODENOSCOPY (EGD) WITH PROPOFOL N/A 05/16/2016   Procedure:  ESOPHAGOGASTRODUODENOSCOPY (EGD) WITH PROPOFOL with dilation;  Surgeon: Lucilla Lame, MD;  Location: [facility];  Service: Endoscopy;  Laterality: N/A;   FEMORAL HERNIA REPAIR  1958   R   fracture L elbow/wrist  2000   INTRAMEDULLARY (IM) NAIL INTERTROCHANTERIC Left 10/12/2020   Procedure: INTRAMEDULLARY (IM) NAIL INTERTROCHANTRIC;  Surgeon: Thornton Park, MD;  Location: [facility];  Service: Orthopedics;  Laterality: Left;   intussception/obstruction  12/98   JOINT REPLACEMENT     left shoulder   kidney stone x2  1991   KYPHOPLASTY N/A 04/13/2020   Procedure: [id number];  Surgeon: Hessie Knows, MD;  Location: [facility];  Service: Orthopedics;  Laterality: N/A;   KYPHOPLASTY N/A 05/18/2020   Procedure: L1 KYPHOPLASTY;  Surgeon: Hessie Knows, MD;  Location: [facility];  Service: Orthopedics;  Laterality: N/A;    KYPHOPLASTY N/A 07/06/2020   Procedure: L4  KYPHOPLASTY;  Surgeon: Hessie Knows, MD;  Location: [facility];  Service: Orthopedics;  Laterality: N/A;   laminectomy L4-5  1971   LUMBAR LAMINECTOMY     1970 ruptured disc    MOUTH SURGERY     myoview stress  8/05   syncope (-)   REVERSE SHOULDER ARTHROPLASTY Left 10/06/2014   Procedure: REVERSE SHOULDER ARTHROPLASTY;  Surgeon: Corky Mull, MD;  Location: [facility];  Service: Orthopedics;  Laterality: Left;   stillbirth  1969   TOTAL ABDOMINAL HYSTERECTOMY W/ BILATERAL SALPINGOOPHORECTOMY  1980   ovarian cancer   WRIST FRACTURE SURGERY  11/05   R    Social History Social History   Tobacco Use   Smoking status: Never   Smokeless tobacco: Never  Vaping Use   Vaping Use: Never used  Substance Use Topics   Alcohol use: No    Comment: may have occasional glass of wine   Drug use: No    Family History Family History  Problem Relation Age of Onset   Early death Father    Diabetes Mother    Heart disease Mother    Arthritis Brother    Cancer Brother    Depression Brother    Hearing loss Brother    Arthritis Brother    Cancer Brother        colon   Hearing loss Brother    Cancer Brother    Diabetes Brother    Hearing loss Brother    Heart disease Brother    Hyperlipidemia Brother    Heart disease Sister    Hypertension Sister    Stroke Sister    Hearing loss Daughter    Hypertension Daughter    Diabetes Paternal Grandfather    Hearing loss Sister    Heart disease Sister    Arthritis Sister    Depression Sister    Hearing loss Sister    Heart disease Sister    COPD Brother    Early death Brother    Early death Brother     Allergies  Allergen Reactions   Amiodarone Other (See Comments)    Severe Thyroid issues    Fosamax [Alendronate]     dysphagia   Penicillins Anaphylaxis and Other (See Comments)    TOLERATED CEFAZOLIN 07/06/20 Has patient had a PCN reaction causing immediate rash, facial/tongue/throat  swelling, SOB or lightheadedness with hypotension: Yes Has patient had a PCN reaction causing severe rash involving mucus membranes or skin necrosis: No Has patient had a PCN reaction that required hospitalization: No Has patient had a PCN reaction occurring within the last 10 years: No If all of  the above answers are "NO", then may proceed with Cephalosporin use.    Sulfa Antibiotics Itching and Rash    Itching    Actos [Pioglitazone]     Not effective    Amaryl [Glimepiride]     Not effective     Atorvastatin Other (See Comments)    Muscle aches & All statins per Patient   Bentyl [Dicyclomine Hcl]     Could not tolerate nausea, reduced concentration, h/a    Ciprofloxacin Other (See Comments)    High blood pressure     Codeine Nausea And Vomiting   Ezetimibe-Simvastatin Other (See Comments)    Muscle aches, nausea, back pain    Lovastatin Other (See Comments)    Myalgias   Macrobid [Nitrofurantoin Macrocrystal]     Severe Itching, rash    Protonix [Pantoprazole Sodium]     Esophageal problems  Wants removed from list   Rosiglitazone Maleate Other (See Comments)    Edema   Statins     Muscle and joint aches to all statins   Victoza [Liraglutide]     Nausea    Alendronate Sodium Rash    Dysphagia and ulceration    Bactrim [Sulfamethoxazole-Trimethoprim] Rash    itching     REVIEW OF SYSTEMS (Negative unless checked)  Constitutional: '[]'$ Weight loss  '[]'$ Fever  '[]'$ Chills Cardiac: '[]'$ Chest pain   '[]'$ Chest pressure   '[]'$ Palpitations   '[]'$ Shortness of breath when laying flat   '[]'$ Shortness of breath with exertion. Vascular:  '[]'$ Pain in legs with walking   '[x]'$ Pain in legs with standing  '[]'$ History of DVT   '[]'$ Phlebitis   '[x]'$ Swelling in legs   '[]'$ Varicose veins   '[]'$ Non-healing ulcers Pulmonary:   '[]'$ Uses home oxygen   '[]'$ Productive cough   '[]'$ Hemoptysis   '[]'$ Wheeze  '[]'$ COPD   '[]'$ Asthma Neurologic:  '[]'$ Dizziness   '[]'$ Seizures   '[]'$ History of stroke   '[]'$ History of TIA  '[]'$ Aphasia   '[]'$ Vissual  changes   '[]'$ Weakness or numbness in arm   '[]'$ Weakness or numbness in leg Musculoskeletal:   '[]'$ Joint swelling   '[]'$ Joint pain   '[]'$ Low back pain Hematologic:  '[]'$ Easy bruising  '[]'$ Easy bleeding   '[]'$ Hypercoagulable state   '[]'$ Anemic Gastrointestinal:  '[]'$ Diarrhea   '[]'$ Vomiting  '[]'$ Gastroesophageal reflux/heartburn   '[]'$ Difficulty swallowing. Genitourinary:  '[]'$ Chronic kidney disease   '[]'$ Difficult urination  '[]'$ Frequent urination   '[]'$ Blood in urine Skin:  '[]'$ Rashes   '[]'$ Ulcers  Psychological:  '[]'$ History of anxiety   '[]'$  History of major depression.  Physical Examination  There were no vitals filed for this visit. There is no height or weight on file to calculate BMI. Gen: WD/WN, NAD Head: [place]/AT, No temporalis wasting.  Ear/Nose/Throat: Hearing grossly intact, nares w/o erythema or drainage, pinna without lesions Eyes: PER, EOMI, sclera nonicteric.  Neck: Supple, no gross masses.  No JVD.  Pulmonary:  Good air movement, no audible wheezing, no use of accessory muscles.  Cardiac: RRR, precordium not hyperdynamic. Vascular:  scattered varicosities present bilaterally.  Moderate venous stasis changes to the legs bilaterally.  2+ soft pitting edema  Vessel Right Left  Radial Palpable Palpable  Gastrointestinal: soft, non-distended. No guarding/no peritoneal signs.  Musculoskeletal: M/S 5/5 throughout.  No deformity.  Neurologic: CN 2-12 intact. Pain and light touch intact in extremities.  Symmetrical.  Speech is fluent. Motor exam as listed above. Psychiatric: Judgment intact, Mood & affect appropriate for pt's clinical situation. Dermatologic: Venous rashes no ulcers noted.  No changes consistent with cellulitis. Lymph : No lichenification or skin changes of chronic lymphedema.  CBC Lab Results  Component Value Date   WBC 6.9 10/20/2020   HGB 9.3 (L) 10/20/2020   HCT 28.6 (L) 10/20/2020   MCV 101.8 (H) 10/20/2020   PLT 233 10/20/2020    BMET    Component Value Date/Time   NA 137 10/20/2020 0430    NA 139 09/14/2020 1451   K 4.8 10/20/2020 0430   CL 101 10/20/2020 0430   CO2 28 10/20/2020 0430   GLUCOSE 117 (H) 10/20/2020 0430   BUN 21 10/20/2020 0430   BUN 17 09/14/2020 1451   CREATININE 0.69 10/20/2020 0430   CALCIUM 8.3 (L) 10/20/2020 0430   GFRNONAA >60 10/20/2020 0430   GFRAA 89 09/26/2019 1011   CrCl cannot be calculated (Patient's most recent lab result is older than the maximum 21 days allowed.).  COAG Lab Results  Component Value Date   INR 2.7 (H) 10/10/2020   INR 1.17 10/06/2014   INR 1.70 10/01/2014    Radiology No results found.   Assessment/Plan 1. Lymphedema Recommend:  No surgery or intervention at this point in time.    I have reviewed my discussion with the patient regarding lymphedema and why it  causes symptoms.  Patient will continue wearing graduated compression on a daily basis. The patient should put the compression on first thing in the morning and removing them in the evening. The patient should not sleep in the compression.   In addition, behavioral modification throughout the day will be continued.  This will include frequent elevation (such as in a recliner), use of over the counter pain medications as needed and exercise such as walking.  The systemic causes for chronic edema such as liver, kidney and cardiac etiologies does not appear to have significant changed over the past year.    The patient will continue aggressive use of the  lymph pump.  This will continue to improve the edema control and prevent sequela such as ulcers and infections.   The patient will follow-up with me on an annual basis.    2. Type 2 diabetes mellitus with neurological manifestations, controlled ([place]) Continue hypoglycemic medications as already ordered, these medications have been reviewed and there are no changes at this time.  Hgb A1C to be monitored as already arranged by primary service   3. Acquired hypothyroidism Continue hormone replacement as  ordered and reviewed, no changes at this time   4. Persistent atrial fibrillation (HCC) Continue antiarrhythmia medications as already ordered, these medications have been reviewed and there are no changes at this time.  Continue anticoagulation as ordered by Cardiology Service   5. Essential hypertension Continue antihypertensive medications as already ordered, these medications have been reviewed and there are no changes at this time.      Hortencia Pilar, MD  12/02/2021 2:59 PM

## 2021-12-05 ENCOUNTER — Encounter (INDEPENDENT_AMBULATORY_CARE_PROVIDER_SITE_OTHER): Payer: Self-pay | Admitting: Vascular Surgery

## 2021-12-05 ENCOUNTER — Ambulatory Visit (INDEPENDENT_AMBULATORY_CARE_PROVIDER_SITE_OTHER): Payer: Medicare HMO | Admitting: Vascular Surgery

## 2021-12-05 VITALS — BP 122/71 | HR 73 | Resp 16 | Wt 177.0 lb

## 2021-12-05 DIAGNOSIS — E039 Hypothyroidism, unspecified: Secondary | ICD-10-CM

## 2021-12-05 DIAGNOSIS — I890 Lymphedema, not elsewhere classified: Secondary | ICD-10-CM | POA: Diagnosis not present

## 2021-12-05 DIAGNOSIS — E1149 Type 2 diabetes mellitus with other diabetic neurological complication: Secondary | ICD-10-CM

## 2021-12-05 DIAGNOSIS — I4819 Other persistent atrial fibrillation: Secondary | ICD-10-CM

## 2021-12-05 DIAGNOSIS — I10 Essential (primary) hypertension: Secondary | ICD-10-CM

## 2021-12-11 ENCOUNTER — Encounter (INDEPENDENT_AMBULATORY_CARE_PROVIDER_SITE_OTHER): Payer: Self-pay | Admitting: Vascular Surgery

## 2021-12-23 LAB — HM DIABETES EYE EXAM

## 2022-01-02 NOTE — Progress Notes (Unsigned)
Cardiology Office Note    Date:  01/03/2022   ID:  Jodi Reeves, DOB February 16, 1928, MRN [id number]  PCP:  [name], Jodi Glow, MD  Cardiologist:  Jodi Rogue, MD  Electrophysiologist:  None   Chief Complaint: Follow-up  History of Present Illness:   Jodi Reeves is a 88 y.o. female with history of nonobstructive CAD by [facility] 2005, permanent A-fib, HFpEF, DM2, esophageal stricture status post dilatation, TIA in 2017, HTN, HLD with statin intolerance, venous insufficiency/lymphedema, ovarian cancer, macrocytic anemia, frequent falls, and history of "flame hemorrhages" in her eyes who presents for follow-up of her CAD and A-fib.  She was diagnosed with Afib in 2005. Cardiac cath in 11/2003 showed 10% LAD stenosis, 20% LCx stenosis, and 40% RCA stenosis. Nuclear stress testing in 03/2014 showed small region of mild ischemia in the apical region with WMA also noted in the apical region, EF 60%. She declined invasive evaluation at that time. Prior chest CTs have shown coronary artery and aortic calcifications.  She was evaluated in 2017 for TIA at [facility] with carotid artery ultrasound showing mild bilateral disease with no significant stenoses and echo demonstrating EF greater than 65%.  Most recent ischemic evaluation via [facility] in 03/2017 showed no evidence of ischemia with an EF greater than 65% and was overall low risk.  Most recent echo from 10/2018 demonstrated an EF of 55 to 78%, diastolic dysfunction, normal RV systolic function and ventricular cavity size, PASP 45.8 mmHg, mild to moderate tricuspid regurgitation, moderately dilated left atrium, and a rhythm of A-fib.  She was last seen in the office in 08/2020 with a weight that was down 30 pounds from 2021.  Poor appetite was noted.  She was not using lymphedema pumps.  She was without symptoms of angina with chronic exertional shortness of breath noted.  She was seen in the ED in 09/2020 for TIA like symptoms with CT of the head and  MRI of the brain showing no acute abnormality at that time.  She was admitted to the hospital in 09/2020 after sustaining a mechanical fall complicated by a left hip fracture, A-fib with RVR, and anemia.  In the perifracture timeframe, the patient's anticoagulation was interrupted due to anemia and frequent falls.  Primary cardiologist rounding note recommends restarting Xarelto once clinically stable with recommendation for occult blood testing if further anemia developed.  She has remained off [place] since.  She has not had cardiology or PCP follow-up since her discharge greater than 1 year ago.  No labs obtained since hospital discharge.  She comes in today and is doing well from a cardiac perspective.  She is without symptoms concerning for angina or decompensation.  She has not had any further falls since she began residing at [facility].  No hematochezia or melena.  She remains off [place] upon reviewing her living facility medication list.  No dizziness, presyncope, or syncope.  Lower extremity swelling is stable.  She is wearing elastic compression wraps.  She reports her living facility has not placed her lymphedema pumps on, therefore she had her son take them back home.  Blood pressure has been well controlled.  Her weight is back up 31 pounds when compared to her prior clinic visit which is her prior baseline as her weight was down 30 pounds when she was seen in 08/2020.  Appetite is improved at her living facility.  She reports labs being drawn through her living facility, though we do not have these available  for review.   Labs independently reviewed: 09/2020 - Hgb 9.3, PLT 233, potassium 4.8, BUN 21, serum creatinine 0.69, magnesium 2.1, A1c 7.8, albumin 3.9, AST/ALT normal 03/2020 - TSH normal 02/2020 - TC 171, TG 79, HDL 67, LDL 87  Past Medical History:  Diagnosis Date   Allergy    Anxiety    Balance disorder    Chronic diastolic CHF (congestive heart failure) ([place])    a. 10/2018 Echo:  EF 55-60%, diast dysfxn, nl RV fxn. RVSP 45.31mHg. Mild-mod TR. Mod dil LA.   Colon polyps    Coronary artery disease, non-occlusive    a. LHC 11/2003: 10% LAD stenosis, 20% LCx stenosis, and 40% RCA stenosis; b. nuc stress test 12/15: small region of mild ischemia in the apical region with WMA also noted in the apical region, EF 60%. She declined invasive evaluation at that time   DM2 (diabetes mellitus, type 2) ([place]    Fall    04/2017 [facility] careeverywhere [facility] multiple fractures, brusing    Falls frequently    h/o left shoudler/wrist fracture and left fingers numb   GERD (gastroesophageal reflux disease)    esophageal web   Granuloma annulare    Dr. PSharlett Iles since 2010   History of chicken pox    History of eating disorder    HLD (hyperlipidemia)    HTN (hypertension)    Hypothyroidism    IBS (irritable bowel syndrome)    diarrhea    Incontinence of bowel    Osteopenia    Osteoporosis    [place]varian cancer ([place]    1980   Persistent atrial fibrillation ([place]    a. CHADS2VASc = > 8 (CHF, HTN, age x 2, DM, TIA x 2, female); b. on Xarelto   SBO (small bowel obstruction) ([place]    1998   TIA (transient ischemic attack)    UTI (urinary tract infection)    Venous insufficiency    Ventral hernia     Past Surgical History:  Procedure Laterality Date   2nd look laparotomy  1982   ABDOMINAL HYSTERECTOMY     for ovarian cancer s/p hysterectomy total 1976 and exp lap 1Christmas 8/05   neg; a fib found    CARPAL TUNNEL RELEASE Left 03/06/2018   Procedure: CARPAL TUNNEL RELEASE ENDOSCOPIC;  Surgeon: PCorky Mull MD;  [place]  Service: Orthopedics;  Laterality: Left;  diabetic - oral meds   CARPAL TUNNEL RELEASE     left CTS Dr.Poggi   cataract OD  2002   cataract surgery  5/07   R   CHOLECYSTECTOMY  1986   DEXA  9/04 and 1/02   EGD/dilation/colon  1/06   ESOPHAGOGASTRODUODENOSCOPY (EGD) WITH PROPOFOL  N/A 05/16/2016   Procedure: ESOPHAGOGASTRODUODENOSCOPY (EGD) WITH PROPOFOL with dilation;  Surgeon: DLucilla Lame MD;  Location: [facility];  Service: Endoscopy;  Laterality: N/A;   FEMORAL HERNIA REPAIR  1958   R   fracture L elbow/wrist  2000   INTRAMEDULLARY (IM) NAIL INTERTROCHANTERIC Left 10/12/2020   Procedure: INTRAMEDULLARY (IM) NAIL INTERTROCHANTRIC;  Surgeon: KThornton Park MD;  Location: [facility];  Service: Orthopedics;  Laterality: Left;   intussception/obstruction  12/98   JOINT REPLACEMENT     left shoulder   kidney stone x2  1991   KYPHOPLASTY N/A 04/13/2020   Procedure: [id number]  Surgeon: MHessie Knows MD;  Location: [facility];  Service: Orthopedics;  Laterality: N/A;  KYPHOPLASTY N/A 05/18/2020   Procedure: L1 KYPHOPLASTY;  Surgeon: Hessie Knows, MD;  Location: [facility];  Service: Orthopedics;  Laterality: N/A;   KYPHOPLASTY N/A 07/06/2020   Procedure: L4  KYPHOPLASTY;  Surgeon: Hessie Knows, MD;  Location: [facility];  Service: Orthopedics;  Laterality: N/A;   laminectomy L4-5  1971   LUMBAR LAMINECTOMY     1970 ruptured disc    MOUTH SURGERY     myoview stress  8/05   syncope (-)   REVERSE SHOULDER ARTHROPLASTY Left 10/06/2014   Procedure: REVERSE SHOULDER ARTHROPLASTY;  Surgeon: Corky Mull, MD;  Location: [facility];  Service: Orthopedics;  Laterality: Left;   stillbirth  1969   TOTAL ABDOMINAL HYSTERECTOMY W/ BILATERAL SALPINGOOPHORECTOMY  1980   ovarian cancer   WRIST FRACTURE SURGERY  11/05   R    Current Medications: Current Meds  Medication Sig   acetaminophen (TYLENOL) 500 MG tablet Take 1,000 mg by mouth every 6 (six) hours as needed.   alum & mag hydroxide-simeth (MAALOX/MYLANTA) 200-200-20 MG/5ML suspension Take 30 mLs by mouth every 4 (four) hours as needed for indigestion.   Ascorbic Acid (VITAMIN C) 1000 MG tablet Take 2,000 mg by mouth daily.   aspirin 325 MG EC tablet Take 325 mg by mouth daily.   bacitracin 500 UNIT/GM ointment Apply  1 Application topically daily.   bisoprolol (ZEBETA) 5 MG tablet Take 5 mg by mouth in the morning and at bedtime.   CALCIUM ANTACID EXTRA STRENGTH 750 MG chewable tablet Chew by mouth.   docusate sodium (COLACE) 100 MG capsule Take 100 mg by mouth daily as needed for mild constipation.   estradiol (ESTRACE) 0.1 MG/GM vaginal cream APPLY 0.'5MG'$  (PEA SIZED AMOUNT) JUST INSIDE THE VAGINA WITH FINGER-TIP ON MONDAY, WEDNESDAY AND FRIDAY NIGHTS.   famotidine (PEPCID) 20 MG tablet Take 20 mg by mouth at bedtime.   folic acid (FOLVITE) 1 MG tablet Take 1 tablet (1 mg total) by mouth daily.   furosemide (LASIX) 20 MG tablet Take 1 tablet (20 mg total) by mouth 2 (two) times daily.   glucose blood (ONE TOUCH ULTRA TEST) test strip Use to test blood sugar once daily E11.49   insulin aspart (NOVOLOG) 100 UNIT/ML injection Inject 0-15 Units into the skin 4 (four) times daily -  before meals and at bedtime. (Patient taking differently: Inject 0-15 Units into the skin 4 (four) times daily. 200-249=2u, 250-299=4u, 300-349=6u, 350-399=8u, 400-449=10u, 450-499=12u, if 500 or greater=call MD)   insulin glargine (LANTUS) 100 UNIT/ML injection Inject 0.1 mLs (10 Units total) into the skin daily. (Patient taking differently: Inject 8 Units into the skin daily.)   levothyroxine (SYNTHROID, LEVOTHROID) 50 MCG tablet Take 50 mcg by mouth daily before breakfast.   magnesium hydroxide (MILK OF MAGNESIA) 400 MG/5ML suspension Take 30 mLs by mouth daily as needed for mild constipation.   Magnesium Oxide 250 MG TABS Take 250 mg by mouth daily.   Multiple Vitamins-Minerals (MULTIVITAMIN GUMMIES ADULT PO) Take 1 each by mouth daily.   naproxen sodium (ALEVE) 220 MG tablet Take 220 mg by mouth daily as needed.   Olopatadine HCl 0.2 % SOLN Place 1 drop into both eyes daily as needed (Allergies).   Omega-3 Fatty Acids (FISH OIL) 1000 MG CAPS Take by mouth.   ondansetron (ZOFRAN) 4 MG tablet Take 1 tablet (4 mg total) by mouth every  6 (six) hours as needed for nausea.   psyllium (METAMUCIL) 58.6 % packet Take 1 packet by mouth daily as  needed (constipation).   SODIUM FLUORIDE 5000 PPM 1.1 % GEL dental gel Take by mouth.   TRADJENTA 5 MG TABS tablet Take 5 mg by mouth daily.   vitamin B-12 (CYANOCOBALAMIN) 1000 MCG tablet Take 1,000 mcg by mouth daily. W/ Folate and B6 sublingual    Allergies:   Amiodarone, Fosamax [alendronate], Penicillins, Sulfa antibiotics, Actos [pioglitazone], Amaryl [glimepiride], Atorvastatin, Bentyl [dicyclomine hcl], Ciprofloxacin, Codeine, Ezetimibe-simvastatin, Lovastatin, Macrobid [nitrofurantoin macrocrystal], Protonix [pantoprazole sodium], Rosiglitazone maleate, Statins, Victoza [liraglutide], Alendronate sodium, and Bactrim [sulfamethoxazole-trimethoprim]   Social History   Socioeconomic History   Marital status: Widowed    Spouse name: Not on file   Number of children: 1   Years of education: Not on file   Highest education level: Not on file  Occupational History   Occupation: retired Therapist, 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  Tobacco Use   Smoking status: Never   Smokeless tobacco: Never  Vaping Use   Vaping Use: Never used  Substance and Sexual Activity   Alcohol use: No    Comment: may have occasional glass of wine   Drug use: No   Sexual activity: Not Currently  Other Topics Concern   Not on file  Social History Narrative   Has living will   Son is healthcare POA    also has grand daughter and great grand son   Would accept CPR but not prolonged artificial ventilation    No feeding tube if cognitively unaware   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)   2 pregnancies 1 stillborn    No longer drives    Resident at [facility]   Financial Resource Strain: Low Risk  (06/21/2021)   Overall Financial Resource Strain (CARDIA)    Difficulty of Paying Living Expenses: Not very hard  Food Insecurity: No Food Insecurity (06/21/2021)   Hunger Vital Sign    Worried About Running Out of  Food in the Last Year: Never true    [place] in the Last Year: Never true  Transportation Needs: No Transportation Needs (06/21/2021)   PRAPARE - Hydrologist (Medical): No    Lack of Transportation (Non-Medical): No  Physical Activity: Insufficiently Active (06/18/2020)   Exercise Vital Sign    Days of Exercise per Week: 3 days    Minutes of Exercise per Session: 20 min  Stress: No Stress Concern Present (06/21/2021)   [place]    Feeling of Stress : Not at all  Social Connections: Unknown (06/21/2021)   Social Connection and Isolation Panel [NHANES]    Frequency of Communication with Friends and Family: More than three times a week    Frequency of Social Gatherings with Friends and Family: Once a week    Attends Religious Services: Not on Advertising copywriter or Organizations: Not on file    Attends Archivist Meetings: Not on file    Marital Status: Not on file     Family History:  The patient's family history includes Arthritis in her brother, brother, and sister; COPD in her brother; Cancer in her brother, brother, and brother; Depression in her brother and sister; Diabetes in her brother, mother, and paternal grandfather; Early death in her brother, brother, and father; Hearing loss in her brother, brother, brother, daughter, sister, and sister; Heart disease in her brother, mother, sister, sister, and sister; Hyperlipidemia in her brother; Hypertension in her daughter and sister;  Stroke in her sister.  ROS:   12-point review of systems is negative unless otherwise noted in the HPI.   EKGs/Labs/Other Studies Reviewed:    Studies reviewed were summarized above. The additional studies were reviewed today:  2D echo 10/24/2018: 1. The left ventricle has normal systolic function, with an ejection  fraction of 55-60%. The cavity size was normal. Left ventricular  diastolic  Doppler parameters are consistent with impaired relaxation.  2. The right ventricle has normal systolic function. The cavity was  normal. There is no increase in right ventricular wall thickness. Right  ventricular systolic pressure is moderately elevated with an estimated  pressure of 45.8 mmHg.  3. Tricuspid valve regurgitation is mild-moderate.  4. Left atrial size was moderately dilated.  5. Rhythm is atrial fibrillation __________  [facility] 04/06/2017: Horizontal ST segment depression ST segment depression of 1 mm was noted during stress in the II, III and aVF leads. The study is normal. This is a low risk study. The left ventricular ejection fraction is hyperdynamic (>65%).   EKG:  EKG is ordered today.  The EKG ordered today demonstrates A-fib, 68 bpm, baseline wandering, poor R wave progression along the precordial leads, nonspecific ST-T changes  Recent Labs: 01/03/2022: BUN 16; Creatinine, Ser 0.66; Hemoglobin 12.7; Platelets 149; Potassium 3.9; Sodium 139  Recent Lipid Panel    Component Value Date/Time   CHOL 171 03/10/2020 1353   CHOL 185 09/26/2019 1011   TRIG 79.0 03/10/2020 1353   HDL 67.50 03/10/2020 1353   HDL 67 09/26/2019 1011   CHOLHDL 3 03/10/2020 1353   VLDL 15.8 03/10/2020 1353   LDLCALC 87 03/10/2020 1353   LDLCALC 107 (H) 09/26/2019 1011   LDLDIRECT 130.3 11/27/2008 1250    PHYSICAL EXAM:    VS:  BP 128/76 (BP Location: Right Arm, Patient Position: Sitting, Cuff Size: Normal)   Pulse 68   Ht '5\' 3"'$  (1.6 m)   Wt 177 lb (80.3 kg)   SpO2 98%   BMI 31.35 kg/m   BMI: Body mass index is 31.35 kg/m.  Physical Exam Vitals reviewed.  Constitutional:      Appearance: She is well-developed.  HENT:     Head: Normocephalic and atraumatic.  Eyes:     General:        Right eye: No discharge.        Left eye: No discharge.  Neck:     Vascular: No JVD.  Cardiovascular:     Rate and Rhythm: Normal rate. Rhythm irregularly irregular.      Heart sounds: Normal heart sounds, S1 normal and S2 normal. Heart sounds not distant. No midsystolic click and no opening snap. No murmur heard.    No friction rub.  Pulmonary:     Effort: Pulmonary effort is normal. No respiratory distress.     Breath sounds: Normal breath sounds. No decreased breath sounds, wheezing or rales.  Chest:     Chest wall: No tenderness.  Abdominal:     General: There is no distension.  Musculoskeletal:     Cervical back: Normal range of motion.     Right lower leg: Edema present.     Left lower leg: Edema present.     Comments: Bilateral lymphedema noted currently wrapped with elastic bands.  Skin:    General: Skin is warm and dry.     Nails: There is no clubbing.  Neurological:     Mental Status: She is alert and oriented to person, place, and time.  Psychiatric:        Speech: Speech normal.        Behavior: Behavior normal.        Thought Content: Thought content normal.        Judgment: Judgment normal.     Wt Readings from Last 3 Encounters:  01/03/22 177 lb (80.3 kg)  12/05/21 177 lb (80.3 kg)  06/21/21 137 lb (62.1 kg)     ASSESSMENT & PLAN:   Nonobstructive CAD/aortic atherosclerosis/HLD: She is doing well and is without symptoms of angina or decompensation.  Currently, she is on full dose aspirin, which has been continued since her surgery in 09/2020 in the absence of [place].  For now, we will continue her on aspirin with plans to resume [place], if possible, as outlined below with discontinuation of aspirin at that time in an effort to minimize bleeding risk.  No indication for ischemic testing at this time.  LDL 87 in 02/2020.  Intolerant to statins and declines rechallenge at this time.  Permanent A-fib: Well-controlled ventricular response.  She remains on bisoprolol.  CHA2DS2-VASc 9 (CHF, HTN, age x2, DM2, TIA x2, vascular disease, sex category).  She has not been maintained on [facility] since her hospital admission in 09/2020, with [place] being  interrupted at that time in the setting of falls, hip fracture, and macrocytic anemia with recommendation to resume [place] when clinically stable.  She has been lost to follow-up with [place] and her PCP since her hospital admission greater than 1 year ago.  Given her significantly elevated CHA2DS2-VASc, the patient would benefit from resumption of [place], and if she is found to not be an anticoagulation candidate, she may benefit from evaluation by EP/structural for consideration of [place].  Check BMP and CBC.  She is aware of stroke risk off [place].  We will look to resume Xarelto if possible based on labs in an effort to decrease her stroke risk.  HFpEF/pulmonary hypertension: She appears euvolemic and well compensated.  She remains on furosemide.  Check BMP.  HTN: Blood pressure is well controlled in the office today.  She remains on bisoprolol.  Lymphedema: Followed by vascular surgery.  Macrocytic anemia: Last CBC from 09/2020 with a low, though stable hemoglobin of 9.3.  Recommend obtaining CBC to evaluate stability of hemoglobin, and to potentially resume [place].  If anemia persists, she will need to follow-up with her PCP for ongoing evaluation and management.    Disposition: F/u with Dr. Rockey Situ or an APP in 2 months.   Medication Adjustments/Labs and Tests Ordered: Current medicines are reviewed at length with the patient today.  Concerns regarding medicines are outlined above. Medication changes, Labs and Tests ordered today are summarized above and listed in the Patient Instructions accessible in Encounters.   Signed, Christell Faith, PA-C 01/03/2022 4:12 PM     [address] [place], [place] [address] [phone]

## 2022-01-03 ENCOUNTER — Ambulatory Visit: Payer: Medicare HMO | Attending: Physician Assistant | Admitting: Physician Assistant

## 2022-01-03 ENCOUNTER — Encounter: Payer: Self-pay | Admitting: Internal Medicine

## 2022-01-03 ENCOUNTER — Other Ambulatory Visit
Admission: RE | Admit: 2022-01-03 | Discharge: 2022-01-03 | Disposition: A | Discharge status: Home / Self Care | Payer: Medicare HMO | Source: Ambulatory Visit | Attending: Physician Assistant | Admitting: Physician Assistant

## 2022-01-03 ENCOUNTER — Encounter: Payer: Self-pay | Admitting: Physician Assistant

## 2022-01-03 VITALS — BP 128/76 | HR 68 | Ht 63.0 in | Wt 177.0 lb

## 2022-01-03 DIAGNOSIS — I4819 Other persistent atrial fibrillation: Secondary | ICD-10-CM | POA: Diagnosis present

## 2022-01-03 DIAGNOSIS — I5032 Chronic diastolic (congestive) heart failure: Secondary | ICD-10-CM

## 2022-01-03 DIAGNOSIS — I890 Lymphedema, not elsewhere classified: Secondary | ICD-10-CM

## 2022-01-03 DIAGNOSIS — D539 Nutritional anemia, unspecified: Secondary | ICD-10-CM

## 2022-01-03 DIAGNOSIS — I4821 Permanent atrial fibrillation: Secondary | ICD-10-CM | POA: Diagnosis not present

## 2022-01-03 DIAGNOSIS — I2510 Atherosclerotic heart disease of native coronary artery without angina pectoris: Secondary | ICD-10-CM | POA: Diagnosis not present

## 2022-01-03 DIAGNOSIS — R600 Localized edema: Secondary | ICD-10-CM | POA: Insufficient documentation

## 2022-01-03 DIAGNOSIS — I700 Atherosclerosis of aorta: Secondary | ICD-10-CM

## 2022-01-03 DIAGNOSIS — I10 Essential (primary) hypertension: Secondary | ICD-10-CM

## 2022-01-03 DIAGNOSIS — I272 Pulmonary hypertension, unspecified: Secondary | ICD-10-CM

## 2022-01-03 LAB — CBC
HCT: 37.4 % (ref 36.0–46.0)
Hemoglobin: 12.7 g/dL (ref 12.0–15.0)
MCH: 33.1 pg (ref 26.0–34.0)
MCHC: 34 g/dL (ref 30.0–36.0)
MCV: 97.4 fL (ref 80.0–100.0)
Platelets: 149 10*3/uL — ABNORMAL LOW (ref 150–400)
RBC: 3.84 MIL/uL — ABNORMAL LOW (ref 3.87–5.11)
RDW: 13.2 % (ref 11.5–15.5)
WBC: 8.1 10*3/uL (ref 4.0–10.5)
nRBC: 0 % (ref 0.0–0.2)

## 2022-01-03 LAB — BASIC METABOLIC PANEL
Anion gap: 7 (ref 5–15)
BUN: 16 mg/dL (ref 8–23)
CO2: 29 mmol/L (ref 22–32)
Calcium: 9 mg/dL (ref 8.9–10.3)
Chloride: 103 mmol/L (ref 98–111)
Creatinine, Ser: 0.66 mg/dL (ref 0.44–1.00)
GFR, Estimated: >60 mL/min (ref >60)
Glucose, Bld: 120 mg/dL — ABNORMAL HIGH (ref 70–99)
Potassium: 3.9 mmol/L (ref 3.5–5.1)
Sodium: 139 mmol/L (ref 135–145)

## 2022-01-03 NOTE — Patient Instructions (Signed)
Medication Instructions:  Your physician recommends that you continue on your current medications as directed. Please refer to the Current Medication list given to you today.  *If you need a refill on your cardiac medications before your next appointment, please call your pharmacy*   Lab Work: [place] today over at the [facility] entrance and check-in at registration desk.   If you have labs (blood work) drawn today and your tests are completely normal, you will receive your results only by: [place] (if you have MyChart) OR A paper copy in the mail If you have any lab test that is abnormal or we need to change your treatment, we will call you to review the results.   Testing/Procedures: None   Follow-Up: At [facility], you and your health needs are our priority.  As part of our continuing mission to provide you with exceptional heart care, we have created designated Provider Care Teams.  These Care Teams include your primary Cardiologist (physician) and Advanced Practice Providers (APPs -  Physician Assistants and Nurse Practitioners) who all work together to provide you with the care you need, when you need it.  We recommend signing up for the patient portal called "MyChart".  Sign up information is provided on this After Visit Summary.  MyChart is used to connect with patients for Virtual Visits (Telemedicine).  Patients are able to view lab/test results, encounter notes, upcoming appointments, etc.  Non-urgent messages can be sent to your provider as well.   To learn more about what you can do with MyChart, go to [email or web address].    Your next appointment:   1 month(s)  The format for your next appointment:   In Person  Provider:   Ida Rogue, MD or Christell Faith, PA-C        Important Information About Sugar

## 2022-01-04 ENCOUNTER — Telehealth: Payer: Self-pay | Admitting: *Deleted

## 2022-01-04 DIAGNOSIS — I2510 Atherosclerotic heart disease of native coronary artery without angina pectoris: Secondary | ICD-10-CM

## 2022-01-04 DIAGNOSIS — I5032 Chronic diastolic (congestive) heart failure: Secondary | ICD-10-CM

## 2022-01-04 DIAGNOSIS — I4821 Permanent atrial fibrillation: Secondary | ICD-10-CM

## 2022-01-04 MED ORDER — RIVAROXABAN 20 MG PO TABS: 20.0000 mg | ORAL_TABLET | Freq: Every day | ORAL | 11 refills | Status: DC

## 2022-01-04 NOTE — Telephone Encounter (Signed)
Spoke with patients son per release form. Reviewed results and recommendations with patients son per release form. He verbalized understanding and also wanted to make sure we call facility with those recommendations as well. Instructed him to call back if there should be any further questions. He was appreciative for the call.

## 2022-01-04 NOTE — Telephone Encounter (Signed)
Spoke with Sports administrator at [facility]. Reviewed instructions to stop aspirin, start xarelto 20 mg once daily, and repeat CBC in one week. They do draw labs there and can fax [place] the results. Requested that she fax them here to our office with Attn: Olin Hauser. She verbalized understanding, repeated all instructions and fax number, with no further questions at this time.

## 2022-01-04 NOTE — Telephone Encounter (Signed)
-----   Message from Rise Mu, PA-C sent at 01/03/2022  4:51 PM EDT ----- Potassium okay Renal function normal Random glucose okay Blood count improved and now normal with a mildly low platelet count Creatinine clearance 68.3 mL/min based on weight and serum creatinine from today  Recommendations: -Discontinue aspirin -Start rivaroxaban 20 mg daily -Follow-up CBC 1 week after initiating Xarelto  Please notify the patient and the living facility of these recommendations

## 2022-01-12 ENCOUNTER — Other Ambulatory Visit: Payer: Self-pay

## 2022-01-12 ENCOUNTER — Emergency Department: Payer: Medicare HMO

## 2022-01-12 ENCOUNTER — Emergency Department
Admission: EM | Admit: 2022-01-12 | Discharge: 2022-01-12 | Disposition: A | Discharge status: Skilled Nursing Facility | Payer: Medicare HMO | Attending: Emergency Medicine | Admitting: Emergency Medicine

## 2022-01-12 DIAGNOSIS — R600 Localized edema: Secondary | ICD-10-CM | POA: Diagnosis not present

## 2022-01-12 DIAGNOSIS — Z20822 Contact with and (suspected) exposure to covid-19: Secondary | ICD-10-CM | POA: Insufficient documentation

## 2022-01-12 DIAGNOSIS — R42 Dizziness and giddiness: Secondary | ICD-10-CM | POA: Insufficient documentation

## 2022-01-12 DIAGNOSIS — I509 Heart failure, unspecified: Secondary | ICD-10-CM | POA: Insufficient documentation

## 2022-01-12 DIAGNOSIS — Z8543 Personal history of malignant neoplasm of ovary: Secondary | ICD-10-CM | POA: Diagnosis not present

## 2022-01-12 DIAGNOSIS — R5381 Other malaise: Secondary | ICD-10-CM

## 2022-01-12 DIAGNOSIS — I110 Hypertensive heart disease with heart failure: Secondary | ICD-10-CM | POA: Diagnosis not present

## 2022-01-12 DIAGNOSIS — E119 Type 2 diabetes mellitus without complications: Secondary | ICD-10-CM | POA: Insufficient documentation

## 2022-01-12 LAB — CBC WITH DIFFERENTIAL/PLATELET
Abs Immature Granulocytes: 0.04 10*3/uL (ref 0.00–0.07)
Basophils Absolute: 0.1 10*3/uL (ref 0.0–0.1)
Basophils Relative: 1 %
Eosinophils Absolute: 0.4 10*3/uL (ref 0.0–0.5)
Eosinophils Relative: 6 %
HCT: 36.9 % (ref 36.0–46.0)
Hemoglobin: 12 g/dL (ref 12.0–15.0)
Immature Granulocytes: 1 %
Lymphocytes Relative: 25 %
Lymphs Abs: 1.9 10*3/uL (ref 0.7–4.0)
MCH: 32.9 pg (ref 26.0–34.0)
MCHC: 32.5 g/dL (ref 30.0–36.0)
MCV: 101.1 fL — ABNORMAL HIGH (ref 80.0–100.0)
Monocytes Absolute: 0.6 10*3/uL (ref 0.1–1.0)
Monocytes Relative: 8 %
Neutro Abs: 4.7 10*3/uL (ref 1.7–7.7)
Neutrophils Relative %: 59 %
Platelets: 164 10*3/uL (ref 150–400)
RBC: 3.65 MIL/uL — ABNORMAL LOW (ref 3.87–5.11)
RDW: 13.3 % (ref 11.5–15.5)
WBC: 7.8 10*3/uL (ref 4.0–10.5)
nRBC: 0 % (ref 0.0–0.2)

## 2022-01-12 LAB — COMPREHENSIVE METABOLIC PANEL
ALT: 14 U/L (ref 0–44)
AST: 60 U/L — ABNORMAL HIGH (ref 15–41)
Albumin: 3.1 g/dL — ABNORMAL LOW (ref 3.5–5.0)
Alkaline Phosphatase: 68 U/L (ref 38–126)
Anion gap: 7 (ref 5–15)
BUN: 22 mg/dL (ref 8–23)
CO2: 27 mmol/L (ref 22–32)
Calcium: 8.6 mg/dL — ABNORMAL LOW (ref 8.9–10.3)
Chloride: 105 mmol/L (ref 98–111)
Creatinine, Ser: 0.75 mg/dL (ref 0.44–1.00)
GFR, Estimated: >60 mL/min (ref >60)
Glucose, Bld: 184 mg/dL — ABNORMAL HIGH (ref 70–99)
Potassium: 4.1 mmol/L (ref 3.5–5.1)
Sodium: 139 mmol/L (ref 135–145)
Total Bilirubin: 0.6 mg/dL (ref 0.3–1.2)
Total Protein: 6.9 g/dL (ref 6.5–8.1)

## 2022-01-12 LAB — TROPONIN I (HIGH SENSITIVITY)
Troponin I (High Sensitivity): 3 ng/L (ref <18)
Troponin I (High Sensitivity): 4 ng/L (ref <18)

## 2022-01-12 LAB — SARS CORONAVIRUS 2 BY RT PCR: SARS Coronavirus 2 by RT PCR: NEGATIVE

## 2022-01-12 LAB — BRAIN NATRIURETIC PEPTIDE: B Natriuretic Peptide: 142.6 pg/mL — ABNORMAL HIGH (ref 0.0–100.0)

## 2022-01-12 NOTE — ED Notes (Signed)
US at bedside

## 2022-01-12 NOTE — ED Notes (Signed)
Patient's brief wet. Patient cleaned and new brief applied. Warm blanket given at this time.

## 2022-01-12 NOTE — ED Notes (Signed)
Pt alert, in bed, waiting on EMS, pt offered something to drink, pt refused.

## 2022-01-12 NOTE — ED Provider Notes (Signed)
[facility] Provider Note    Event Date/Time   First MD Initiated Contact with Patient 01/12/22 [phone]     (approximate)   History   Chief Complaint: Dizziness   HPI  Jodi Reeves is a 88 y.o. female with a history of hypertension, diabetes, GERD, atrial fibrillation, CHF, ovarian cancer who comes the ED complaining of bilateral frontal headache, nausea, dizziness and tingling in both hands for the past 24 hours.  She saw her cardiologist 2 days ago and was started on Xarelto.  She denies chest pain or shortness of breath.  She does also complain of increased peripheral edema.     Physical Exam   Triage Vital Signs: ED Triage Vitals  Enc Vitals Group     BP 01/12/22 0913 (!) 161/76     Pulse Rate 01/12/22 0913 73     Resp 01/12/22 0913 18     Temp 01/12/22 0913 98.1 F (36.7 C)     Temp Source 01/12/22 0913 Oral     SpO2 01/12/22 0913 96 %     Weight 01/12/22 0909 177 lb (80.3 kg)     Height 01/12/22 0909 '5\' 3"'$  (1.6 m)     Head Circumference --      Peak Flow --      Pain Score 01/12/22 0909 4     Pain Loc --      Pain Edu? --      Excl. in [place]? --     Most recent vital signs: Vitals:   01/12/22 1100 01/12/22 1200  BP: (!) 122/52 123/67  Pulse: 68 66  Resp: 15 14  Temp:    SpO2: 97% 98%    General: Awake, no distress.  CV:  Good peripheral perfusion.  Irregularly irregular normal.  Symmetric distal pulses Resp:  Normal effort.  Clear to auscultation bilaterally Abd:  No distention.  Soft nontender Other:  2+ pitting edema bilateral lower extremities, symmetric calf circumference.  There is some mild calf tenderness.    ED Results / Procedures / Treatments   Labs (all labs ordered are listed, but only abnormal results are displayed) Labs Reviewed  COMPREHENSIVE METABOLIC PANEL - Abnormal; Notable for the following components:      Result Value   Glucose, Bld 184 (*)    Calcium 8.6 (*)    Albumin 3.1 (*)    AST 60 (*)    All  other components within normal limits  CBC WITH DIFFERENTIAL/PLATELET - Abnormal; Notable for the following components:   RBC 3.65 (*)    MCV 101.1 (*)    All other components within normal limits  BRAIN NATRIURETIC PEPTIDE - Abnormal; Notable for the following components:   B Natriuretic Peptide 142.6 (*)    All other components within normal limits  SARS CORONAVIRUS 2 BY RT PCR  TROPONIN I (HIGH SENSITIVITY)  TROPONIN I (HIGH SENSITIVITY)     EKG Interpreted by me Atrial fibrillation, rate of 85.  Left axis, intervals.  Poor R wave progression.  Normal ST segments and T waves.  No ischemic changes.   RADIOLOGY CT head interpreted by me, negative for intracranial hemorrhage or mass.  Radiology report reviewed.  Chest x-ray unremarkable.  Ultrasound bilateral lower extremity unremarkable   PROCEDURES:  Procedures   MEDICATIONS ORDERED IN ED: Medications - No data to display   IMPRESSION / MDM / [place] / ED COURSE  I reviewed the triage vital signs and the nursing notes.  Differential diagnosis includes, but is not limited to, dehydration, electrolyte abnormality, AKI, intracranial hemorrhage, pulmonary edema, pleural effusion, viral illness, DVT  Patient's presentation is most consistent with acute presentation with potential threat to life or bodily function.  Patient presents with multiple symptoms most suggestive of a viral illness.  Due to her age and comorbidities, will need a broad work-up including CT head, chest x-ray, ultrasound lower legs, labs.   ----------------------------------------- 12:23 PM on 01/12/2022 ----------------------------------------- Work-up entirely unremarkable.  Will trend troponin, if reassuring patient to be discharged home to follow-up with primary care and continue her medications.  Patient is not requiring admission at this time.  COVID-negative      FINAL CLINICAL IMPRESSION(S) / ED  DIAGNOSES   Final diagnoses:  Dizziness     Rx / DC Orders   ED Discharge Orders     None        Note:  This document was prepared using Dragon voice recognition software and may include unintentional dictation errors.   Carrie Mew, MD 01/12/22 1224

## 2022-01-12 NOTE — ED Triage Notes (Signed)
Patient to ED via [facility] from [facility] for dizziness and tinging in both hands. Patient also c/o headache and nausea. Patient states this started during the night and was unable to sleep due to symptoms. Patient was seen by cardiology on Tuesday and they switched her from aspirin to [place].

## 2022-01-12 NOTE — Discharge Instructions (Signed)
Your lab tests, CT scan of the head, chest x-ray, and ultrasounds of the legs were all okay today.  Please continue your medications as prescribed and follow-up with your doctor for ongoing monitoring of your symptoms.

## 2022-01-12 NOTE — ED Notes (Signed)
[facility] called for transport back to [facility]

## 2022-01-16 ENCOUNTER — Encounter: Payer: Self-pay | Admitting: Cardiovascular Disease

## 2022-02-05 NOTE — Progress Notes (Unsigned)
I Cardiology Office Note  Date:  02/06/2022   ID:  Jodi Reeves, DOB 07-May-1927, MRN [id number]  PCP:  [name], Nino Glow, MD   Chief Complaint  Patient presents with   1 month follow up     HPI:  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  CT chest 04/20/2016, Calcifications of the aortic arch, mild calcification of the coronary arteries. Moderate calcifications of the abdominal aorta, which is tortuous. Previous stretching of esophagus with Dr. Candace Cruise Chronic leg swelling, venous/lymphedema She presents for follow-up of her atrial fibrillation and chest pain, leg swelling  Last office visit with myself 5/22 Seen by one of our providers 9/23 Now residing at [facility]  In the ER with dizziness 01/12/22 Records reviewed, no significant findings noted On further discussions today reports having chronic ABD pain, back pain ""Cut me in 1/2" Feels that is her back.  Seen in the hospital March 2022 closed compression fracture of L1 Followed by Dr. Rudene Christians and Dr. Roland Rack Previously had tramadol for pain, prior cortisone shots  Has leg wraps in place, wears these on a regular basis, edema improving Nurse puts them on Followed by podiatry  Presents today in a wheelchair She is very concerned that she is not working with PT, not walking on a regular basis Her hope is to get stronger and then go home  Cardiac meds reviewed On xarelto 20 daily, lasix 20 BID, bisoprolol '5mg'$  daily  Currently not using her lymphedema pump   Overactive bladder issues, Frequent sipping RAre unna wraps  Does little bit of walking around the house  Overactive bladder Consistently takes Lasix 20 twice a day Stable lab work  No recent falls  Labs  HBA1C  6.9 Total chol 171, LDL 87 CR 0.75 BUN 22 BNP 142 down from 318 a year ago  echo 10/2018 RVSP 47 mm Hg, EF >60% HBA1C 7.4 CR 0.79  EKG personally reviewed by myself on todays visit Atrial fibrillation rate 78 poor R wave progression to the anterior precordial leads  Previous  falls , Went to [facility], , Sacral fx Left face bruising, severe, Xarelto held Did not qualify for placement/SNF   CT scan chest March 18, 2017 with cardiomegaly and coronary artery calcification, aortic atherosclerosis  Chronic GI issues Previously had diarrhea, now with constipation. Working with GI  CT chest 04/20/2016: Calcifications of the aortic arch. mild calcification of the coronary arteries. Moderate calcifications of the abdominal aorta, which is tortuous. The origins of the celiac, SMA, single bilateral renal arteries are patent. The origin of the IMA is not seen, and may be occluded chronically.  TIA in January 2017, went to [facility] for evaluation She was talking with a friend, had word finding difficulty, friend was finishing her sentences Workup at [facility] reviewed She had CT scan of her head which was essentially normal,  carotid ultrasound that showed mild bilateral disease, no significant stenoses,  echocardiogram with ejection fraction greater than 65%, mild valve abnormality    PMH:   has a past medical history of Allergy, Anxiety, Balance disorder, Chronic diastolic CHF (congestive heart failure) ([place]), Colon polyps, Coronary artery disease, non-occlusive, DM2 (diabetes mellitus, type 2) ([place]), Fall, Falls frequently, GERD (gastroesophageal reflux disease), Granuloma annulare, History of chicken pox, History of eating disorder, HLD (hyperlipidemia), HTN (  hypertension), Hypothyroidism, IBS (irritable bowel syndrome), Incontinence of bowel, Osteopenia, Osteoporosis, Ovarian cancer ([place]), Persistent atrial fibrillation ([place]), SBO (small bowel obstruction) ([place]), TIA (transient ischemic attack), UTI  (urinary tract infection), Venous insufficiency, and Ventral hernia.  PSH:    Past Surgical History:  Procedure Laterality Date   2nd look laparotomy  1982   ABDOMINAL HYSTERECTOMY     for ovarian cancer s/p hysterectomy total 1976 and exp lap [place]  8/05   neg; a fib found    CARPAL TUNNEL RELEASE Left 03/06/2018   Procedure: CARPAL TUNNEL RELEASE ENDOSCOPIC;  Surgeon: Corky Mull, MD;  Location: [place];  Service: Orthopedics;  Laterality: Left;  diabetic - oral meds   CARPAL TUNNEL RELEASE     left CTS Dr.Poggi   cataract OD  2002   cataract surgery  5/07   R   CHOLECYSTECTOMY  1986   DEXA  9/04 and 1/02   EGD/dilation/colon  1/06   ESOPHAGOGASTRODUODENOSCOPY (EGD) WITH PROPOFOL N/A 05/16/2016   Procedure: ESOPHAGOGASTRODUODENOSCOPY (EGD) WITH PROPOFOL with dilation;  Surgeon: Lucilla Lame, MD;  Location: [facility];  Service: Endoscopy;  Laterality: N/A;   FEMORAL HERNIA REPAIR  1958   R   fracture L elbow/wrist  2000   INTRAMEDULLARY (IM) NAIL INTERTROCHANTERIC Left 10/12/2020   Procedure: INTRAMEDULLARY (IM) NAIL INTERTROCHANTRIC;  Surgeon: Thornton Park, MD;  Location: [facility];  Service: Orthopedics;  Laterality: Left;   intussception/obstruction  12/98   JOINT REPLACEMENT     left shoulder   kidney stone x2  1991   KYPHOPLASTY N/A 04/13/2020   Procedure: [id number];  Surgeon: Hessie Knows, MD;  Location: [facility];  Service: Orthopedics;  Laterality: N/A;   KYPHOPLASTY N/A 05/18/2020   Procedure: L1 KYPHOPLASTY;  Surgeon: Hessie Knows, MD;  Location: [facility];  Service: Orthopedics;  Laterality: N/A;   KYPHOPLASTY N/A 07/06/2020   Procedure: L4  KYPHOPLASTY;  Surgeon: Hessie Knows, MD;  Location: [facility];  Service: Orthopedics;  Laterality: N/A;   laminectomy L4-5  1971   LUMBAR LAMINECTOMY     1970 ruptured disc    MOUTH SURGERY     myoview stress  8/05   syncope (-)   REVERSE  SHOULDER ARTHROPLASTY Left 10/06/2014   Procedure: REVERSE SHOULDER ARTHROPLASTY;  Surgeon: Corky Mull, MD;  Location: [facility];  Service: Orthopedics;  Laterality: Left;   stillbirth  1969   TOTAL ABDOMINAL HYSTERECTOMY W/ BILATERAL SALPINGOOPHORECTOMY  1980   ovarian cancer   WRIST FRACTURE SURGERY  11/05   R    Current Outpatient Medications  Medication Sig Dispense Refill   acetaminophen (TYLENOL) 500 MG tablet Take 1,000 mg by mouth every 6 (six) hours as needed.     alum & mag hydroxide-simeth (MAALOX/MYLANTA) 200-200-20 MG/5ML suspension Take 30 mLs by mouth every 4 (four) hours as needed for indigestion. 355 mL 0   Ascorbic Acid (VITAMIN C) 1000 MG tablet Take 2,000 mg by mouth daily.     aspirin 325 MG tablet Take 325 mg by mouth daily.     bacitracin 500 UNIT/GM ointment Apply 1 Application topically daily.     bisoprolol (ZEBETA) 5 MG tablet Take 5 mg by mouth in the morning and at bedtime.     CALCIUM ANTACID EXTRA STRENGTH 750 MG chewable tablet Chew by mouth.     docusate sodium (COLACE) 100 MG capsule Take 100 mg by mouth daily as needed  for mild constipation.     estradiol (ESTRACE) 0.1 MG/GM vaginal cream APPLY 0.'5MG'$  (PEA SIZED AMOUNT) JUST INSIDE THE VAGINA WITH FINGER-TIP ON MONDAY, WEDNESDAY AND FRIDAY NIGHTS. 42.5 g 0   famotidine (PEPCID) 20 MG tablet Take 20 mg by mouth at bedtime.     folic acid (FOLVITE) 1 MG tablet Take 1 tablet (1 mg total) by mouth daily. (Patient taking differently: Take 1 mg by mouth every other day.)     furosemide (LASIX) 20 MG tablet Take 1 tablet (20 mg total) by mouth 2 (two) times daily. 60 tablet 5   glucose blood (ONE TOUCH ULTRA TEST) test strip Use to test blood sugar once daily E11.49 100 each 1   insulin aspart (NOVOLOG) 100 UNIT/ML injection Inject 0-15 Units into the skin 4 (four) times daily -  before meals and at bedtime. (Patient taking differently: Inject 0-15 Units into the skin 4 (four) times daily. 200-249=2u, 250-299=4u,  300-349=6u, 350-399=8u, 400-449=10u, 450-499=12u, if 500 or greater=call MD) 10 mL 11   insulin glargine (LANTUS) 100 UNIT/ML injection Inject 0.1 mLs (10 Units total) into the skin daily. (Patient taking differently: Inject 8 Units into the skin daily.) 10 mL 11   levothyroxine (SYNTHROID, LEVOTHROID) 50 MCG tablet Take 50 mcg by mouth daily before breakfast.     magnesium hydroxide (MILK OF MAGNESIA) 400 MG/5ML suspension Take 30 mLs by mouth daily as needed for mild constipation. 355 mL 0   Magnesium Oxide 250 MG TABS Take 250 mg by mouth daily.     Multiple Vitamins-Minerals (MULTIVITAMIN GUMMIES ADULT PO) Take 1 each by mouth daily.     naproxen sodium (ALEVE) 220 MG tablet Take 220 mg by mouth daily as needed.     Olopatadine HCl 0.2 % SOLN Place 1 drop into both eyes daily as needed (Allergies).     Omega-3 Fatty Acids (FISH OIL) 1000 MG CAPS Take by mouth.     ondansetron (ZOFRAN) 4 MG tablet Take 1 tablet (4 mg total) by mouth every 6 (six) hours as needed for nausea. 20 tablet 0   psyllium (METAMUCIL) 58.6 % packet Take 1 packet by mouth daily as needed (constipation).     pyridOXINE (B-6) 50 MG tablet Take 1 tablet (50 mg total) by mouth daily.     rivaroxaban (XARELTO) 20 MG TABS tablet Take 1 tablet (20 mg total) by mouth daily with supper. 30 tablet 11   simethicone (MYLICON) 80 MG chewable tablet Chew 80 mg by mouth every 6 (six) hours as needed for flatulence.     Skin Protectants, Misc. (DIMETHICONE-ZINC OXIDE) cream Apply topically 2 (two) times daily as needed for dry skin. Please label the bottle for the vaginal area only. 142 g 0   SODIUM FLUORIDE 5000 PPM 1.1 % GEL dental gel Take by mouth.     TRADJENTA 5 MG TABS tablet Take 5 mg by mouth daily.     vitamin B-12 (CYANOCOBALAMIN) 1000 MCG tablet Take 1,000 mcg by mouth daily. W/ Folate and B6 sublingual     insulin glargine-yfgn (SEMGLEE) 100 UNIT/ML Pen Inject into the skin. (Patient not taking: Reported on 08/25/2021)     No  current facility-administered medications for this visit.    Allergies:   Amiodarone, Fosamax [alendronate], Penicillins, Sulfa antibiotics, Actos [pioglitazone], Amaryl [glimepiride], Atorvastatin, Bentyl [dicyclomine hcl], Ciprofloxacin, Codeine, Ezetimibe-simvastatin, Lovastatin, Macrobid [nitrofurantoin macrocrystal], Protonix [pantoprazole sodium], Rosiglitazone maleate, Statins, Victoza [liraglutide], Alendronate sodium, and Bactrim [sulfamethoxazole-trimethoprim]   Social History:  The patient  reports  that she has never smoked. She has never used smokeless tobacco. She reports that she does not drink alcohol and does not use drugs.   Family History:   family history includes Arthritis in her brother, brother, and sister; COPD in her brother; Cancer in her brother, brother, and brother; Depression in her brother and sister; Diabetes in her brother, mother, and paternal grandfather; Early death in her brother, brother, and father; Hearing loss in her brother, brother, brother, daughter, sister, and sister; Heart disease in her brother, mother, sister, sister, and sister; Hyperlipidemia in her brother; Hypertension in her daughter and sister; Stroke in her sister.    Review of Systems: Review of Systems  Constitutional: Negative.   HENT: Negative.    Respiratory: Negative.    Cardiovascular:  Positive for leg swelling.  Gastrointestinal: Negative.   Musculoskeletal: Negative.        Unsteady gait  Neurological: Negative.   All other systems reviewed and are negative.   PHYSICAL EXAM: VS:  BP 122/80 (BP Location: Left Arm, Patient Position: Sitting, Cuff Size: Normal)   Pulse 78   Ht '5\' 3"'$  (1.6 m)   Wt 177 lb (80.3 kg)   SpO2 98%   BMI 31.35 kg/m  , BMI Body mass index is 31.35 kg/m. Constitutional:  oriented to person, place, and time. No distress.  HENT:  Head: Grossly normal Eyes:  no discharge. No scleral icterus.  Neck: No JVD, no carotid bruits  Cardiovascular:  Irregularly irregular no murmurs appreciated Leg wraps in place bilaterally Pulmonary/Chest: Clear to auscultation bilaterally, no wheezes or rails Abdominal: Soft.  no distension.  no tenderness.  Musculoskeletal: Normal range of motion Neurological:  normal muscle tone. Coordination normal. No atrophy Skin: Skin warm and dry Psychiatric: normal affect, pleasant   Recent Labs: 01/12/2022: ALT 14; B Natriuretic Peptide 142.6; BUN 22; Creatinine, Ser 0.75; Hemoglobin 12.0; Platelets 164; Potassium 4.1; Sodium 139    Lipid Panel Lab Results  Component Value Date   CHOL 171 03/10/2020   HDL 67.50 03/10/2020   LDLCALC 87 03/10/2020   TRIG 79.0 03/10/2020      Wt Readings from Last 3 Encounters:  02/06/22 177 lb (80.3 kg)  01/12/22 177 lb (80.3 kg)  01/03/22 177 lb (80.3 kg)     ASSESSMENT AND PLAN:  Pulmonary hypertension Prior Echocardiogram right heart pressures approximately 47 mmHg Recommend we continue Lasix 20 twice daily Leg wraps in place Denies significant shortness of breath  Mixed hyperlipidemia  CT scan including mild coronary calcifications, aortic arch calcifications, moderate aortic atherosclerosis in the abdominal aorta  does not want a statin  No further work-up at this time  Lymphedema: Not using her compression pumps,  Symptoms are stable on bilateral leg wraps with Velcro wrapping Overall legs improved compared to prior visits Followed by podiatry  Essential hypertension, benign -  Blood pressure is well controlled on today's visit. No changes made to the medications.  Persistent atrial fibrillation (HCC) Tolerating Xarelto, no recent falls Contributing to her diastolic CHF symptoms Continue Lasix milligrams twice a day, appears euvolemic, rate well controlled  Chronic diastolic CHF (congestive heart failure) (HCC) Lasix as above, appears euvolemic  Diabetes: Followed by endocrine, numbers doing better  Lymphedema Followed by Dr.  Ronalee Belts Has not been using the pumps, Seems to be stable with bilateral leg wrappings with Velcro straps  Debility Consider PT if available   Total encounter time more than 40 minutes  Greater than 50% was spent in counseling and coordination  of care with the patient   Orders Placed This Encounter  Procedures   EKG 12-Lead    Signed, Esmond Plants, M.D., Ph.D. 02/06/2022  [place], [place]

## 2022-02-06 ENCOUNTER — Encounter: Payer: Self-pay | Admitting: Cardiovascular Disease

## 2022-02-06 ENCOUNTER — Ambulatory Visit: Payer: Medicare HMO | Attending: Cardiovascular Disease | Admitting: Cardiovascular Disease

## 2022-02-06 VITALS — BP 122/80 | HR 78 | Ht 63.0 in | Wt 177.0 lb

## 2022-02-06 DIAGNOSIS — I890 Lymphedema, not elsewhere classified: Secondary | ICD-10-CM

## 2022-02-06 DIAGNOSIS — I5032 Chronic diastolic (congestive) heart failure: Secondary | ICD-10-CM

## 2022-02-06 DIAGNOSIS — I10 Essential (primary) hypertension: Secondary | ICD-10-CM

## 2022-02-06 DIAGNOSIS — D539 Nutritional anemia, unspecified: Secondary | ICD-10-CM

## 2022-02-06 DIAGNOSIS — I4821 Permanent atrial fibrillation: Secondary | ICD-10-CM

## 2022-02-06 DIAGNOSIS — I700 Atherosclerosis of aorta: Secondary | ICD-10-CM | POA: Diagnosis not present

## 2022-02-06 DIAGNOSIS — E1149 Type 2 diabetes mellitus with other diabetic neurological complication: Secondary | ICD-10-CM

## 2022-02-06 DIAGNOSIS — I272 Pulmonary hypertension, unspecified: Secondary | ICD-10-CM

## 2022-02-06 DIAGNOSIS — I2510 Atherosclerotic heart disease of native coronary artery without angina pectoris: Secondary | ICD-10-CM

## 2022-02-06 DIAGNOSIS — R079 Chest pain, unspecified: Secondary | ICD-10-CM

## 2022-02-06 NOTE — Patient Instructions (Addendum)
Consider PT for leg strengthening, walking if available  Medication Instructions:  No changes  If you need a refill on your cardiac medications before your next appointment, please call your pharmacy.   Lab work: No new labs needed  Testing/Procedures: No new testing needed  Follow-Up: At [facility], you and your health needs are our priority.  As part of our continuing mission to provide you with exceptional heart care, we have created designated Provider Care Teams.  These Care Teams include your primary Cardiologist (physician) and Advanced Practice Providers (APPs -  Physician Assistants and Nurse Practitioners) who all work together to provide you with the care you need, when you need it.  You will need a follow up appointment in 12 months  Providers on your designated Care Team:   Murray Hodgkins, NP Christell Faith, PA-C Cadence Kathlen Mody, [place]  COVID-19 Vaccine Information can be found at: [email or web address] For questions related to vaccine distribution or appointments, please email [email or web address] or call [phone].

## 2022-02-15 ENCOUNTER — Telehealth: Payer: Self-pay | Admitting: Cardiovascular Disease

## 2022-02-15 NOTE — Telephone Encounter (Signed)
Attempted to call [place] back at [facility]. She is not available at this time. Per staff taking the call, the patient's physician there was wanting to know if Xarelto could be changed to Eliquis 5 mg BID.  I inquired what the reason is to change from Xarelto to Eliquis and caller could not advise me as to why.  I asked if she could have someone there clarify this and call us back with the reason for the request to switch blood thinners is being made.   Staff advised they will have [place] call back tomorrow to clarify.

## 2022-02-15 NOTE — Telephone Encounter (Signed)
Pt c/o medication issue:  1. Name of Medication:   rivaroxaban (XARELTO) 20 MG TABS tablet  2. How are you currently taking this medication (dosage and times per day)?   As prescribed  3. Are you having a reaction (difficulty breathing--STAT)?   No  4. What is your medication issue?   Caller stated the pharmacist at [facility] would like to change the patient's medication from Xarelto to Eliquis.  Caller stated if she is not available can also speak with Melissa Crutchfield.

## 2022-02-15 NOTE — Telephone Encounter (Signed)
Did they give a reason?  Would defer this to Dr Rockey Situ

## 2022-02-17 NOTE — Telephone Encounter (Signed)
Minna Merritts, MD  Sent: Thu February 16, 2022  5:16 PM  To: [facility] Triage          Message  She would be okay to change from Xarelto over to Eliquis 5 twice daily  Reduced dose was not needed  Thx  TG

## 2022-02-17 NOTE — Telephone Encounter (Signed)
I called and spoke with [place] at [facility]. Per [place], the pharmacist there had inquired about switching Xarelto to Eliquis 5 mg BID to lower bleeding risk.  I advised [place] that Dr. Rockey Situ is ok with the patient stopping Xarelto and starting Eliquis 5 mg BID.   [place] voices understanding and is agreeable.

## 2022-12-07 ENCOUNTER — Ambulatory Visit (INDEPENDENT_AMBULATORY_CARE_PROVIDER_SITE_OTHER): Payer: Medicare HMO | Admitting: Vascular Surgery

## 2022-12-20 NOTE — Progress Notes (Signed)
MRN : [id number]  Jodi Reeves is a 89 y.o. ([birth year]) female who presents with chief complaint of legs swell.  History of Present Illness:  The patient returns to the office for followup evaluation regarding leg swelling.  The swelling has improved quite a bit and the pain associated with swelling has decreased substantially. There have not been any interval development of a ulcerations or wounds.   Since the previous visit the patient has been wearing graduated compression wraps and has noted some improvement in the lymphedema. The patient has been using compression routinely morning until night.  We finally were able to work this out with the nurses   The patient also states elevation during the day and exercise (such as walking) is being done too.  No outpatient medications have been marked as taking for the 12/21/22 encounter (Appointment) with Gilda Crease, Latina Craver, MD.    Past Medical History:  Diagnosis Date   Allergy    Anxiety    Balance disorder    Chronic diastolic CHF (congestive heart failure) (HCC)    a. 10/2018 Echo: EF 55-60%, diast dysfxn, nl RV fxn. RVSP 45.8mmHg. Mild-mod TR. Mod dil LA.   Colon polyps    Coronary artery disease, non-occlusive    a. LHC 11/2003: 10% LAD stenosis, 20% LCx stenosis, and 40% RCA stenosis; b. nuc stress test 12/15: small region of mild ischemia in the apical region with WMA also noted in the apical region, EF 60%. 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 CHADS2VASc = > 8 (CHF, HTN, age x 2, DM, TIA x 2, female); b. on Xarelto   SBO (small bowel obstruction) (HCC)    1998   TIA (transient ischemic attack)    UTI (urinary tract infection)    Venous insufficiency    Ventral hernia     Past Surgical History:  Procedure Laterality Date   2nd look laparotomy  1982   ABDOMINAL HYSTERECTOMY     for ovarian cancer s/p hysterectomy total 1976 and exp lap 1980   APPENDECTOMY     BACK SURGERY     CARDIAC CATHETERIZATION  8/05   neg; a fib found    CARPAL TUNNEL RELEASE Left 03/06/2018   Procedure: CARPAL TUNNEL RELEASE ENDOSCOPIC;  Surgeon: Christena Flake, MD;  Location: [facility];  Service: Orthopedics;  Laterality: Left;  diabetic - oral meds   CARPAL TUNNEL RELEASE     left CTS Dr.Poggi   cataract OD  2002   cataract surgery  5/07   R   CHOLECYSTECTOMY  1986   DEXA  9/04 and 1/02   EGD/dilation/colon  1/06   ESOPHAGOGASTRODUODENOSCOPY (EGD) WITH PROPOFOL N/A 05/16/2016   Procedure: ESOPHAGOGASTRODUODENOSCOPY (EGD) WITH PROPOFOL with dilation;  Surgeon: Midge Minium, MD;  Location: [facility];  Service: Endoscopy;  Laterality: N/A;   FEMORAL HERNIA REPAIR  1958   R   fracture L elbow/wrist  2000   INTRAMEDULLARY (IM) NAIL INTERTROCHANTERIC Left 10/12/2020   Procedure: INTRAMEDULLARY (IM) NAIL INTERTROCHANTRIC;  Surgeon: Juanell Fairly, MD;  Location: [facility];  Service: Orthopedics;  Laterality: Left;   intussception/obstruction  12/98   JOINT REPLACEMENT     left shoulder   kidney stone x2  1991   KYPHOPLASTY N/A 04/13/2020   Procedure: [id number];  Surgeon: Kennedy Bucker, MD;  Location: [facility];  Service: Orthopedics;  Laterality: N/A;   KYPHOPLASTY N/A 05/18/2020   Procedure: L1 KYPHOPLASTY;  Surgeon: Kennedy Bucker, MD;  Location: [facility];  Service:  Orthopedics;  Laterality: N/A;   KYPHOPLASTY N/A 07/06/2020   Procedure: L4  KYPHOPLASTY;  Surgeon: Kennedy Bucker, MD;  Location: [facility];  Service: Orthopedics;  Laterality: N/A;   laminectomy L4-5  1971   LUMBAR LAMINECTOMY     1970 ruptured disc    MOUTH SURGERY     myoview stress  8/05   syncope (-)   REVERSE SHOULDER ARTHROPLASTY Left 10/06/2014   Procedure: REVERSE SHOULDER ARTHROPLASTY;  Surgeon: Christena Flake, MD;  Location: [facility];  Service: Orthopedics;  Laterality: Left;   stillbirth  1969   TOTAL ABDOMINAL HYSTERECTOMY W/ BILATERAL SALPINGOOPHORECTOMY  1980   ovarian cancer   WRIST FRACTURE SURGERY  11/05   R    Social History Social History   Tobacco Use   Smoking status: Never   Smokeless tobacco: Never  Vaping Use   Vaping status: Never Used  Substance Use Topics   Alcohol use: No    Comment: may have occasional glass of wine   Drug use: No    Family History Family History  Problem Relation Age of Onset   Early death Father    Diabetes Mother    Heart disease Mother    Arthritis Brother    Cancer Brother    Depression Brother    Hearing loss Brother    Arthritis Brother    Cancer Brother        colon   Hearing loss Brother    Cancer Brother    Diabetes Brother    Hearing loss Brother    Heart disease Brother    Hyperlipidemia Brother    Heart disease Sister    Hypertension Sister    Stroke Sister    Hearing loss Daughter    Hypertension Daughter    Diabetes Paternal Grandfather    Hearing loss Sister    Heart disease Sister    Arthritis Sister    Depression Sister    Hearing loss Sister    Heart disease Sister    COPD Brother    Early death Brother    Early death Brother     Allergies  Allergen Reactions   Amiodarone Other (See Comments)    Severe Thyroid issues    Fosamax [Alendronate]     dysphagia   Penicillins Anaphylaxis and Other (See Comments)    TOLERATED CEFAZOLIN 07/06/20 Has patient had a PCN reaction causing  immediate rash, facial/tongue/throat swelling, SOB or lightheadedness with hypotension: Yes Has patient had a PCN reaction causing severe rash involving mucus membranes or skin necrosis: No Has patient  had a PCN reaction that required hospitalization: No Has patient had a PCN reaction occurring within the last 10 years: No If all of the above answers are "NO", then may proceed with Cephalosporin use.    Sulfa Antibiotics Itching and Rash    Itching    Actos [Pioglitazone]     Not effective    Amaryl [Glimepiride]     Not effective     Atorvastatin Other (See Comments)    Muscle aches & All statins per Patient   Bentyl [Dicyclomine Hcl]     Could not tolerate nausea, reduced concentration, h/a    Ciprofloxacin Other (See Comments)    High blood pressure     Codeine Nausea And Vomiting   Ezetimibe-Simvastatin Other (See Comments)    Muscle aches, nausea, back pain    Lovastatin Other (See Comments)    Myalgias   Macrobid [Nitrofurantoin Macrocrystal]     Severe Itching, rash    Protonix [Pantoprazole Sodium]     Esophageal problems  Wants removed from list   Rosiglitazone Maleate Other (See Comments)    Edema   Statins     Muscle and joint aches to all statins   Victoza [Liraglutide]     Nausea    Alendronate Sodium Rash    Dysphagia and ulceration    Bactrim [Sulfamethoxazole-Trimethoprim] Rash    itching     REVIEW OF SYSTEMS (Negative unless checked)  Constitutional: [] Weight loss  [] Fever  [] Chills Cardiac: [] Chest pain   [] Chest pressure   [] Palpitations   [] Shortness of breath when laying flat   [] Shortness of breath with exertion. Vascular:  [] Pain in legs with walking   [x] Pain in legs with standing  [] History of DVT   [] Phlebitis   [x] Swelling in legs   [] Varicose veins   [] Non-healing ulcers Pulmonary:   [] Uses home oxygen   [] Productive cough   [] Hemoptysis   [] Wheeze  [] COPD   [] Asthma Neurologic:  [] Dizziness   [] Seizures   [] History of stroke    [] History of TIA  [] Aphasia   [] Vissual changes   [] Weakness or numbness in arm   [] Weakness or numbness in leg Musculoskeletal:   [] Joint swelling   [] Joint pain   [] Low back pain Hematologic:  [] Easy bruising  [] Easy bleeding   [] Hypercoagulable state   [] Anemic Gastrointestinal:  [] Diarrhea   [] Vomiting  [] Gastroesophageal reflux/heartburn   [] Difficulty swallowing. Genitourinary:  [] Chronic kidney disease   [] Difficult urination  [] Frequent urination   [] Blood in urine Skin:  [] Rashes   [] Ulcers  Psychological:  [] History of anxiety   []  History of major depression.  Physical Examination  There were no vitals filed for this visit. There is no height or weight on file to calculate BMI. Gen: WD/WN, NAD Head: Andrews/AT, No temporalis wasting.  Ear/Nose/Throat: Hearing grossly intact, nares w/o erythema or drainage, pinna without lesions Eyes: PER, EOMI, sclera nonicteric.  Neck: Supple, no gross masses.  No JVD.  Pulmonary:  Good air movement, no audible wheezing, no use of accessory muscles.  Cardiac: RRR, precordium not hyperdynamic. Vascular:  scattered varicosities present bilaterally.  Mild venous stasis changes to the legs bilaterally.  2+ soft pitting edema, CEAP [id number]  Vessel Right Left  Radial Palpable Palpable  Gastrointestinal: soft, non-distended. No guarding/no peritoneal signs.  Musculoskeletal: M/S 5/5 throughout.  No deformity.  Neurologic: CN 2-12 intact. Pain and light touch intact in extremities.  Symmetrical.  Speech is fluent. Motor exam as listed above. Psychiatric: Judgment intact, Mood & affect appropriate  for pt's clinical situation. Dermatologic: Venous rashes no ulcers noted.  No changes consistent with cellulitis. Lymph : No lichenification or skin changes of chronic lymphedema.  CBC Lab Results  Component Value Date   WBC 7.8 01/12/2022   HGB 12.0 01/12/2022   HCT 36.9 01/12/2022   MCV 101.1 (H) 01/12/2022   PLT 164 01/12/2022    BMET     Component Value Date/Time   NA 139 01/12/2022 0919   NA 139 09/14/2020 1451   K 4.1 01/12/2022 0919   CL 105 01/12/2022 0919   CO2 27 01/12/2022 0919   GLUCOSE 184 (H) 01/12/2022 0919   BUN 22 01/12/2022 0919   BUN 17 09/14/2020 1451   CREATININE 0.75 01/12/2022 0919   CALCIUM 8.6 (L) 01/12/2022 0919   GFRNONAA >60 01/12/2022 0919   GFRAA 89 09/26/2019 1011   CrCl cannot be calculated (Patient's most recent lab result is older than the maximum 21 days allowed.).  COAG Lab Results  Component Value Date   INR 2.7 (H) 10/10/2020   INR 1.17 10/06/2014   INR 1.70 10/01/2014    Radiology No results found.   Assessment/Plan 1. Lymphedema Recommend:  No surgery or intervention at this point in time.  I have reviewed my discussion with the patient regarding venous insufficiency and why it causes symptoms. I have discussed with the patient the chronic skin changes that accompany venous insufficiency and the long term sequela such as ulceration. Patient will contnue wearing graduated compression stockings on a daily basis, as this has provided excellent control of his edema. The patient will put the stockings on first thing in the morning and removing them in the evening. The patient is reminded not to sleep in the stockings.  In addition, behavioral modification including elevation during the day will be initiated. Exercise is strongly encouraged.  Previous duplex ultrasound of the lower extremities shows normal deep system, no significant superficial reflux was identified.  Given the patient's good control and lack of any problems regarding the venous insufficiency and lymphedema no further interventions are warranted.    The patient will follow up with me PRN should anything change.  The patient voices agreement with this plan.  2. Permanent atrial fibrillation (HCC) Continue antiarrhythmia medications as already ordered, these medications have been reviewed and there are no  changes at this time.  Continue anticoagulation as ordered by Cardiology Service  3. Essential hypertension Continue antihypertensive medications as already ordered, these medications have been reviewed and there are no changes at this time.  4. Coronary artery disease, non-occlusive Continue cardiac and antihypertensive medications as already ordered and reviewed, no changes at this time.  Continue statin as ordered and reviewed, no changes at this time  Nitrates PRN for chest pain  5. Type 2 diabetes mellitus with neurological manifestations, controlled (HCC) Continue hypoglycemic medications as already ordered, these medications have been reviewed and there are no changes at this time.  Hgb A1C to be monitored as already arranged by primary service    Levora Dredge, MD  12/20/2022 3:49 PM

## 2022-12-21 ENCOUNTER — Ambulatory Visit (INDEPENDENT_AMBULATORY_CARE_PROVIDER_SITE_OTHER): Payer: Medicare HMO | Admitting: Vascular Surgery

## 2022-12-21 ENCOUNTER — Encounter (INDEPENDENT_AMBULATORY_CARE_PROVIDER_SITE_OTHER): Payer: Self-pay | Admitting: Vascular Surgery

## 2022-12-21 VITALS — BP 120/68 | HR 75 | Resp 16

## 2022-12-21 DIAGNOSIS — I890 Lymphedema, not elsewhere classified: Secondary | ICD-10-CM

## 2022-12-21 DIAGNOSIS — I10 Essential (primary) hypertension: Secondary | ICD-10-CM | POA: Diagnosis not present

## 2022-12-21 DIAGNOSIS — I4821 Permanent atrial fibrillation: Secondary | ICD-10-CM | POA: Diagnosis not present

## 2022-12-21 DIAGNOSIS — E1149 Type 2 diabetes mellitus with other diabetic neurological complication: Secondary | ICD-10-CM

## 2022-12-21 DIAGNOSIS — I2510 Atherosclerotic heart disease of native coronary artery without angina pectoris: Secondary | ICD-10-CM

## 2022-12-24 ENCOUNTER — Encounter (INDEPENDENT_AMBULATORY_CARE_PROVIDER_SITE_OTHER): Payer: Self-pay | Admitting: Vascular Surgery

## 2023-03-07 ENCOUNTER — Other Ambulatory Visit: Payer: Self-pay | Admitting: Orthopedic Surgery

## 2023-03-07 DIAGNOSIS — M4807 Spinal stenosis, lumbosacral region: Secondary | ICD-10-CM

## 2023-03-20 ENCOUNTER — Encounter: Payer: Self-pay | Admitting: Orthopedic Surgery

## 2023-03-21 ENCOUNTER — Encounter: Payer: Self-pay | Admitting: Orthopedic Surgery

## 2023-03-29 ENCOUNTER — Ambulatory Visit
Admission: RE | Admit: 2023-03-29 | Discharge: 2023-03-29 | Disposition: A | Discharge status: Home / Self Care | Payer: Medicare HMO | Source: Ambulatory Visit | Attending: Orthopedic Surgery | Admitting: Orthopedic Surgery

## 2023-03-29 DIAGNOSIS — M4807 Spinal stenosis, lumbosacral region: Secondary | ICD-10-CM

## 2023-04-25 ENCOUNTER — Inpatient Hospital Stay: Payer: Medicare HMO | Admitting: Registered Nurse

## 2023-04-25 ENCOUNTER — Emergency Department: Payer: Medicare HMO

## 2023-04-25 ENCOUNTER — Encounter: Payer: Self-pay | Admitting: Family Medicine

## 2023-04-25 ENCOUNTER — Inpatient Hospital Stay
Admission: EM | Admit: 2023-04-25 | Discharge: 2023-05-07 | DRG: 853 | Disposition: A | Discharge status: Skilled Nursing Facility | Payer: Medicare HMO | Source: Skilled Nursing Facility | Attending: Internal Medicine | Admitting: Internal Medicine

## 2023-04-25 ENCOUNTER — Encounter
Admission: EM | Disposition: A | Discharge status: Skilled Nursing Facility | Payer: Self-pay | Source: Skilled Nursing Facility | Attending: Internal Medicine

## 2023-04-25 ENCOUNTER — Other Ambulatory Visit: Payer: Self-pay

## 2023-04-25 DIAGNOSIS — R7881 Bacteremia: Secondary | ICD-10-CM | POA: Diagnosis not present

## 2023-04-25 DIAGNOSIS — Z818 Family history of other mental and behavioral disorders: Secondary | ICD-10-CM

## 2023-04-25 DIAGNOSIS — E876 Hypokalemia: Secondary | ICD-10-CM | POA: Insufficient documentation

## 2023-04-25 DIAGNOSIS — R6521 Severe sepsis with septic shock: Secondary | ICD-10-CM | POA: Diagnosis not present

## 2023-04-25 DIAGNOSIS — Z1612 Extended spectrum beta lactamase (ESBL) resistance: Secondary | ICD-10-CM | POA: Diagnosis present

## 2023-04-25 DIAGNOSIS — E1142 Type 2 diabetes mellitus with diabetic polyneuropathy: Secondary | ICD-10-CM | POA: Diagnosis present

## 2023-04-25 DIAGNOSIS — E872 Acidosis, unspecified: Secondary | ICD-10-CM | POA: Diagnosis present

## 2023-04-25 DIAGNOSIS — G9341 Metabolic encephalopathy: Secondary | ICD-10-CM | POA: Diagnosis present

## 2023-04-25 DIAGNOSIS — N136 Pyonephrosis: Secondary | ICD-10-CM | POA: Diagnosis present

## 2023-04-25 DIAGNOSIS — A419 Sepsis, unspecified organism: Secondary | ICD-10-CM

## 2023-04-25 DIAGNOSIS — Z1152 Encounter for screening for COVID-19: Secondary | ICD-10-CM

## 2023-04-25 DIAGNOSIS — Z79899 Other long term (current) drug therapy: Secondary | ICD-10-CM

## 2023-04-25 DIAGNOSIS — Z825 Family history of asthma and other chronic lower respiratory diseases: Secondary | ICD-10-CM

## 2023-04-25 DIAGNOSIS — F419 Anxiety disorder, unspecified: Secondary | ICD-10-CM | POA: Diagnosis present

## 2023-04-25 DIAGNOSIS — I2510 Atherosclerotic heart disease of native coronary artery without angina pectoris: Secondary | ICD-10-CM | POA: Diagnosis present

## 2023-04-25 DIAGNOSIS — I480 Paroxysmal atrial fibrillation: Secondary | ICD-10-CM

## 2023-04-25 DIAGNOSIS — E1165 Type 2 diabetes mellitus with hyperglycemia: Secondary | ICD-10-CM | POA: Diagnosis present

## 2023-04-25 DIAGNOSIS — Z66 Do not resuscitate: Secondary | ICD-10-CM | POA: Diagnosis present

## 2023-04-25 DIAGNOSIS — Z96612 Presence of left artificial shoulder joint: Secondary | ICD-10-CM | POA: Diagnosis present

## 2023-04-25 DIAGNOSIS — Z87442 Personal history of urinary calculi: Secondary | ICD-10-CM

## 2023-04-25 DIAGNOSIS — E11649 Type 2 diabetes mellitus with hypoglycemia without coma: Secondary | ICD-10-CM | POA: Diagnosis not present

## 2023-04-25 DIAGNOSIS — E039 Hypothyroidism, unspecified: Secondary | ICD-10-CM

## 2023-04-25 DIAGNOSIS — I872 Venous insufficiency (chronic) (peripheral): Secondary | ICD-10-CM | POA: Diagnosis present

## 2023-04-25 DIAGNOSIS — B9620 Unspecified Escherichia coli [E. coli] as the cause of diseases classified elsewhere: Secondary | ICD-10-CM

## 2023-04-25 DIAGNOSIS — N179 Acute kidney failure, unspecified: Secondary | ICD-10-CM | POA: Diagnosis present

## 2023-04-25 DIAGNOSIS — N139 Obstructive and reflux uropathy, unspecified: Secondary | ICD-10-CM | POA: Diagnosis not present

## 2023-04-25 DIAGNOSIS — M810 Age-related osteoporosis without current pathological fracture: Secondary | ICD-10-CM | POA: Diagnosis present

## 2023-04-25 DIAGNOSIS — I4819 Other persistent atrial fibrillation: Secondary | ICD-10-CM | POA: Diagnosis present

## 2023-04-25 DIAGNOSIS — Z823 Family history of stroke: Secondary | ICD-10-CM

## 2023-04-25 DIAGNOSIS — A498 Other bacterial infections of unspecified site: Secondary | ICD-10-CM | POA: Diagnosis not present

## 2023-04-25 DIAGNOSIS — E1151 Type 2 diabetes mellitus with diabetic peripheral angiopathy without gangrene: Secondary | ICD-10-CM | POA: Diagnosis present

## 2023-04-25 DIAGNOSIS — I5032 Chronic diastolic (congestive) heart failure: Secondary | ICD-10-CM | POA: Diagnosis present

## 2023-04-25 DIAGNOSIS — D6959 Other secondary thrombocytopenia: Secondary | ICD-10-CM | POA: Diagnosis present

## 2023-04-25 DIAGNOSIS — Z79890 Hormone replacement therapy (postmenopausal): Secondary | ICD-10-CM

## 2023-04-25 DIAGNOSIS — E871 Hypo-osmolality and hyponatremia: Secondary | ICD-10-CM | POA: Diagnosis present

## 2023-04-25 DIAGNOSIS — Z794 Long term (current) use of insulin: Secondary | ICD-10-CM | POA: Diagnosis not present

## 2023-04-25 DIAGNOSIS — Z860100 Personal history of colon polyps, unspecified: Secondary | ICD-10-CM

## 2023-04-25 DIAGNOSIS — A4151 Sepsis due to Escherichia coli [E. coli]: Secondary | ICD-10-CM | POA: Diagnosis present

## 2023-04-25 DIAGNOSIS — Z83438 Family history of other disorder of lipoprotein metabolism and other lipidemia: Secondary | ICD-10-CM

## 2023-04-25 DIAGNOSIS — Z7984 Long term (current) use of oral hypoglycemic drugs: Secondary | ICD-10-CM

## 2023-04-25 DIAGNOSIS — Z882 Allergy status to sulfonamides status: Secondary | ICD-10-CM

## 2023-04-25 DIAGNOSIS — Z833 Family history of diabetes mellitus: Secondary | ICD-10-CM

## 2023-04-25 DIAGNOSIS — A4150 Gram-negative sepsis, unspecified: Secondary | ICD-10-CM | POA: Diagnosis not present

## 2023-04-25 DIAGNOSIS — K219 Gastro-esophageal reflux disease without esophagitis: Secondary | ICD-10-CM | POA: Insufficient documentation

## 2023-04-25 DIAGNOSIS — Z7982 Long term (current) use of aspirin: Secondary | ICD-10-CM

## 2023-04-25 DIAGNOSIS — Z8261 Family history of arthritis: Secondary | ICD-10-CM

## 2023-04-25 DIAGNOSIS — Z8659 Personal history of other mental and behavioral disorders: Secondary | ICD-10-CM

## 2023-04-25 DIAGNOSIS — Z8543 Personal history of malignant neoplasm of ovary: Secondary | ICD-10-CM

## 2023-04-25 DIAGNOSIS — Z888 Allergy status to other drugs, medicaments and biological substances status: Secondary | ICD-10-CM

## 2023-04-25 DIAGNOSIS — R300 Dysuria: Secondary | ICD-10-CM | POA: Diagnosis present

## 2023-04-25 DIAGNOSIS — N390 Urinary tract infection, site not specified: Principal | ICD-10-CM

## 2023-04-25 DIAGNOSIS — I110 Hypertensive heart disease with heart failure: Secondary | ICD-10-CM | POA: Diagnosis present

## 2023-04-25 DIAGNOSIS — E785 Hyperlipidemia, unspecified: Secondary | ICD-10-CM | POA: Diagnosis present

## 2023-04-25 DIAGNOSIS — Z880 Allergy status to penicillin: Secondary | ICD-10-CM

## 2023-04-25 DIAGNOSIS — E878 Other disorders of electrolyte and fluid balance, not elsewhere classified: Secondary | ICD-10-CM | POA: Diagnosis present

## 2023-04-25 DIAGNOSIS — Z8249 Family history of ischemic heart disease and other diseases of the circulatory system: Secondary | ICD-10-CM

## 2023-04-25 DIAGNOSIS — Z7901 Long term (current) use of anticoagulants: Secondary | ICD-10-CM

## 2023-04-25 DIAGNOSIS — Z881 Allergy status to other antibiotic agents status: Secondary | ICD-10-CM

## 2023-04-25 DIAGNOSIS — N200 Calculus of kidney: Secondary | ICD-10-CM

## 2023-04-25 DIAGNOSIS — Z9049 Acquired absence of other specified parts of digestive tract: Secondary | ICD-10-CM

## 2023-04-25 DIAGNOSIS — R578 Other shock: Secondary | ICD-10-CM | POA: Diagnosis not present

## 2023-04-25 DIAGNOSIS — Z8673 Personal history of transient ischemic attack (TIA), and cerebral infarction without residual deficits: Secondary | ICD-10-CM

## 2023-04-25 HISTORY — PX: CYSTOSCOPY WITH RETROGRADE PYELOGRAM, URETEROSCOPY AND STENT PLACEMENT: SHX5789

## 2023-04-25 LAB — COMPREHENSIVE METABOLIC PANEL
ALT: 15 U/L (ref 0–44)
AST: 43 U/L — ABNORMAL HIGH (ref 15–41)
Albumin: 3.1 g/dL — ABNORMAL LOW (ref 3.5–5.0)
Alkaline Phosphatase: 99 U/L (ref 38–126)
Anion gap: 9 (ref 5–15)
BUN: 22 mg/dL (ref 8–23)
CO2: 28 mmol/L (ref 22–32)
Calcium: 8.9 mg/dL (ref 8.9–10.3)
Chloride: 95 mmol/L — ABNORMAL LOW (ref 98–111)
Creatinine, Ser: 1.26 mg/dL — ABNORMAL HIGH (ref 0.44–1.00)
GFR, Estimated: 40 mL/min — ABNORMAL LOW (ref >60)
Glucose, Bld: 143 mg/dL — ABNORMAL HIGH (ref 70–99)
Potassium: 3.7 mmol/L (ref 3.5–5.1)
Sodium: 132 mmol/L — ABNORMAL LOW (ref 135–145)
Total Bilirubin: 1 mg/dL (ref 0.0–1.2)
Total Protein: 7.7 g/dL (ref 6.5–8.1)

## 2023-04-25 LAB — CBC WITH DIFFERENTIAL/PLATELET
Abs Immature Granulocytes: 0.05 10*3/uL (ref 0.00–0.07)
Basophils Absolute: 0 10*3/uL (ref 0.0–0.1)
Basophils Relative: 0 %
Eosinophils Absolute: 0 10*3/uL (ref 0.0–0.5)
Eosinophils Relative: 0 %
HCT: 37.2 % (ref 36.0–46.0)
Hemoglobin: 12.7 g/dL (ref 12.0–15.0)
Immature Granulocytes: 0 %
Lymphocytes Relative: 9 %
Lymphs Abs: 1.1 10*3/uL (ref 0.7–4.0)
MCH: 33.8 pg (ref 26.0–34.0)
MCHC: 34.1 g/dL (ref 30.0–36.0)
MCV: 98.9 fL (ref 80.0–100.0)
Monocytes Absolute: 1.1 10*3/uL — ABNORMAL HIGH (ref 0.1–1.0)
Monocytes Relative: 9 %
Neutro Abs: 10 10*3/uL — ABNORMAL HIGH (ref 1.7–7.7)
Neutrophils Relative %: 82 %
Platelets: 164 10*3/uL (ref 150–400)
RBC: 3.76 MIL/uL — ABNORMAL LOW (ref 3.87–5.11)
RDW: 12.9 % (ref 11.5–15.5)
WBC: 12.2 10*3/uL — ABNORMAL HIGH (ref 4.0–10.5)
nRBC: 0 % (ref 0.0–0.2)

## 2023-04-25 LAB — URINALYSIS, ROUTINE W REFLEX MICROSCOPIC
Bilirubin Urine: NEGATIVE
Glucose, UA: NEGATIVE mg/dL
Ketones, ur: NEGATIVE mg/dL
Nitrite: NEGATIVE
Protein, ur: 100 mg/dL — AB
RBC / HPF: >50 RBC/hpf (ref 0–5)
Specific Gravity, Urine: 1.012 (ref 1.005–1.030)
WBC, UA: >50 WBC/hpf (ref 0–5)
pH: 5 (ref 5.0–8.0)

## 2023-04-25 LAB — LACTIC ACID, PLASMA
Lactic Acid, Venous: 2.4 mmol/L (ref 0.5–1.9)
Lactic Acid, Venous: 1.1 mmol/L (ref 0.5–1.9)

## 2023-04-25 LAB — CBG MONITORING, ED: Glucose-Capillary: 115 mg/dL — ABNORMAL HIGH (ref 70–99)

## 2023-04-25 LAB — RESP PANEL BY RT-PCR (RSV, FLU A&B, COVID)  RVPGX2
Influenza A by PCR: NEGATIVE
Influenza B by PCR: NEGATIVE
Resp Syncytial Virus by PCR: NEGATIVE
SARS Coronavirus 2 by RT PCR: NEGATIVE

## 2023-04-25 LAB — LIPASE, BLOOD: Lipase: 28 U/L (ref 11–51)

## 2023-04-25 SURGERY — CYSTOURETEROSCOPY, WITH RETROGRADE PYELOGRAM AND STENT INSERTION
Anesthesia: General | Laterality: Right

## 2023-04-25 MED ORDER — SODIUM CHLORIDE 0.9 % IV BOLUS
1000.0000 mL | Freq: Once | INTRAVENOUS | Status: AC
  Administered 2023-04-25: 1000 mL via INTRAVENOUS

## 2023-04-25 MED ORDER — SODIUM CHLORIDE 0.9 % IV SOLN: 1.0000 g | Freq: Once | INTRAVENOUS | Status: DC

## 2023-04-25 MED ORDER — FENTANYL CITRATE (PF) 100 MCG/2ML IJ SOLN: 25.0000 ug | INTRAMUSCULAR | Status: DC | PRN

## 2023-04-25 MED ORDER — ACETAMINOPHEN 650 MG RE SUPP: 650.0000 mg | Freq: Four times a day (QID) | RECTAL | Status: DC | PRN

## 2023-04-25 MED ORDER — ONDANSETRON HCL 4 MG/2ML IJ SOLN: 4.0000 mg | Freq: Four times a day (QID) | INTRAMUSCULAR | Status: DC | PRN

## 2023-04-25 MED ORDER — ACETAMINOPHEN 325 MG PO TABS
650.0000 mg | ORAL_TABLET | Freq: Four times a day (QID) | ORAL | Status: DC | PRN
  Administered 2023-05-01 – 2023-05-05 (×3): 650 mg via ORAL
  Filled 2023-04-25 (×3): qty 2

## 2023-04-25 MED ORDER — LACTATED RINGERS IV SOLN
150.0000 mL/h | INTRAVENOUS | Status: AC
  Administered 2023-04-26: 150 mL/h via INTRAVENOUS

## 2023-04-25 MED ORDER — IPRATROPIUM-ALBUTEROL 0.5-2.5 (3) MG/3ML IN SOLN
RESPIRATORY_TRACT | Status: AC
  Filled 2023-04-25: qty 3

## 2023-04-25 MED ORDER — FENTANYL CITRATE (PF) 100 MCG/2ML IJ SOLN
INTRAMUSCULAR | Status: AC
  Filled 2023-04-25: qty 2

## 2023-04-25 MED ORDER — PROPOFOL 10 MG/ML IV BOLUS
INTRAVENOUS | Status: AC
  Filled 2023-04-25: qty 20

## 2023-04-25 MED ORDER — TRAZODONE HCL 50 MG PO TABS
25.0000 mg | ORAL_TABLET | Freq: Every evening | ORAL | Status: DC | PRN
  Administered 2023-04-27 (×2): 25 mg via ORAL
  Filled 2023-04-25 (×2): qty 1

## 2023-04-25 MED ORDER — CEFAZOLIN SODIUM-DEXTROSE 2-4 GM/100ML-% IV SOLN
INTRAVENOUS | Status: AC
  Filled 2023-04-25: qty 100

## 2023-04-25 MED ORDER — ONDANSETRON HCL 4 MG PO TABS: 4.0000 mg | ORAL_TABLET | Freq: Four times a day (QID) | ORAL | Status: DC | PRN

## 2023-04-25 MED ORDER — SODIUM CHLORIDE 0.9 % IR SOLN
Status: DC | PRN
  Administered 2023-04-25: 1

## 2023-04-25 MED ORDER — ACETAMINOPHEN 500 MG PO TABS
1000.0000 mg | ORAL_TABLET | Freq: Once | ORAL | Status: AC
  Administered 2023-04-25: 1000 mg via ORAL
  Filled 2023-04-25: qty 2

## 2023-04-25 MED ORDER — LIDOCAINE HCL (PF) 2 % IJ SOLN
INTRAMUSCULAR | Status: AC
  Filled 2023-04-25: qty 5

## 2023-04-25 MED ORDER — ENOXAPARIN SODIUM 30 MG/0.3ML IJ SOSY
30.0000 mg | PREFILLED_SYRINGE | INTRAMUSCULAR | Status: DC
  Administered 2023-04-26 – 2023-04-29 (×4): 30 mg via SUBCUTANEOUS
  Filled 2023-04-25 (×4): qty 0.3

## 2023-04-25 MED ORDER — SUCCINYLCHOLINE CHLORIDE 200 MG/10ML IV SOSY
PREFILLED_SYRINGE | INTRAVENOUS | Status: DC | PRN
  Administered 2023-04-25: 80 mg via INTRAVENOUS

## 2023-04-25 MED ORDER — IOHEXOL 300 MG/ML  SOLN
INTRAMUSCULAR | Status: DC | PRN
  Administered 2023-04-25: 10 mL

## 2023-04-25 MED ORDER — SODIUM CHLORIDE 0.9 % IV SOLN
1.0000 g | Freq: Once | INTRAVENOUS | Status: AC
  Administered 2023-04-25: 1 g via INTRAVENOUS
  Filled 2023-04-25: qty 20

## 2023-04-25 MED ORDER — FENTANYL CITRATE (PF) 100 MCG/2ML IJ SOLN
INTRAMUSCULAR | Status: DC | PRN
  Administered 2023-04-25: 25 ug via INTRAVENOUS

## 2023-04-25 MED ORDER — PROPOFOL 10 MG/ML IV BOLUS
INTRAVENOUS | Status: DC | PRN
  Administered 2023-04-25: 100 mg via INTRAVENOUS

## 2023-04-25 MED ORDER — MAGNESIUM HYDROXIDE 400 MG/5ML PO SUSP
30.0000 mL | Freq: Every day | ORAL | Status: DC | PRN
  Administered 2023-04-29 – 2023-05-02 (×2): 30 mL via ORAL
  Filled 2023-04-25 (×2): qty 30

## 2023-04-25 MED ORDER — LIDOCAINE HCL (CARDIAC) PF 100 MG/5ML IV SOSY
PREFILLED_SYRINGE | INTRAVENOUS | Status: DC | PRN
  Administered 2023-04-25: 60 mg via INTRAVENOUS

## 2023-04-25 MED ORDER — ONDANSETRON HCL 4 MG/2ML IJ SOLN: 4.0000 mg | Freq: Once | INTRAMUSCULAR | Status: DC | PRN

## 2023-04-25 MED ORDER — CEFAZOLIN SODIUM-DEXTROSE 2-3 GM-%(50ML) IV SOLR
INTRAVENOUS | Status: DC | PRN
  Administered 2023-04-25: 2 g via INTRAVENOUS

## 2023-04-25 SURGICAL SUPPLY — 12 items
BAG URO DRAIN 4000ML (MISCELLANEOUS) ×1 IMPLANT
CATH FOL 2WAY LX 16X30 (CATHETERS) IMPLANT
GLOVE BIOGEL M 7.0 STRL (GLOVE) ×1 IMPLANT
GOWN STRL REUS W/ TWL LRG LVL3 (GOWN DISPOSABLE) ×1 IMPLANT
GUIDEWIRE STR DUAL SENSOR (WIRE) ×2 IMPLANT
IV NS 1000ML BAXH (IV SOLUTION) ×1 IMPLANT
KIT TURNOVER KIT A (KITS) ×1 IMPLANT
PACK CYSTO (CUSTOM PROCEDURE TRAY) ×1 IMPLANT
SET CYSTO W/LG BORE CLAMP LF (SET/KITS/TRAYS/PACK) ×1 IMPLANT
STENT URET 6FRX24 CONTOUR (STENTS) IMPLANT
TUBING CONNECTING 10 (TUBING) ×1 IMPLANT
WATER STERILE IRR 500ML POUR (IV SOLUTION) ×1 IMPLANT

## 2023-04-25 NOTE — ED Provider Notes (Addendum)
 [facility] Provider Note    Event Date/Time   First MD Initiated Contact with Patient 04/25/23 1737     (approximate)   History   Dysuria   HPI  Jodi Reeves is a 89 y.o. female who comes in for [facility] due to concerns for altered mental status.  Patient does not know why she is here.  I did call the facility to get additional information.  They stated that over the past few days she has not been acting her normal self with more fatigue and more confusion.  They noted that today they did try to get a urine sample on her and the look cloudy but they had not sent it off and so they do not know if it was an actual infection or not.  They stated that they were thinking about putting her on antibiotics but they were unable to get any today and they did not want her mental status to worsen.  They were worried that if she had sepsis her something else going on and they wanted her to be evaluated.  Physical Exam   Triage Vital Signs: ED Triage Vitals  Encounter Vitals Group     BP 04/25/23 1715 111/82     Systolic BP Percentile --      Diastolic BP Percentile --      Pulse Rate 04/25/23 1715 82     Resp 04/25/23 1715 16     Temp 04/25/23 1715 99.6 F (37.6 C)     Temp Source 04/25/23 1715 Oral     SpO2 04/25/23 1715 99 %     Weight --      Height --      Head Circumference --      Peak Flow --      Pain Score 04/25/23 1718 4     Pain Loc --      Pain Education --      Exclude from Growth Chart --     Most recent vital signs: Vitals:   04/25/23 1715 04/25/23 1736  BP: 111/82 117/63  Pulse: 82 81  Resp: 16 18  Temp: 99.6 F (37.6 C)   SpO2: 99% 98%     General: Awake, no distress.  CV:  Good peripheral perfusion.  Resp:  Normal effort.  Abd:  No distention.  Other:  Patient appears confused.  Abdomen appears soft and nontender.   ED Results / Procedures / Treatments   Labs (all labs ordered are listed, but only abnormal  results are displayed) Labs Reviewed  CBC WITH DIFFERENTIAL/PLATELET - Abnormal; Notable for the following components:      Result Value   WBC 12.2 (*)    RBC 3.76 (*)    Neutro Abs 10.0 (*)    Monocytes Absolute 1.1 (*)    All other components within normal limits  COMPREHENSIVE METABOLIC PANEL - Abnormal; Notable for the following components:   Sodium 132 (*)    Chloride 95 (*)    Glucose, Bld 143 (*)    Creatinine, Ser 1.26 (*)    Albumin 3.1 (*)    AST 43 (*)    GFR, Estimated 40 (*)    All other components within normal limits  LACTIC ACID, PLASMA - Abnormal; Notable for the following components:   Lactic Acid, Venous 2.4 (*)    All other components within normal limits  URINALYSIS, ROUTINE W REFLEX MICROSCOPIC - Abnormal; Notable for the following components:   Color,  Urine YELLOW (*)    APPearance TURBID (*)    Hgb urine dipstick MODERATE (*)    Protein, ur 100 (*)    Leukocytes,Ua MODERATE (*)    Bacteria, UA MANY (*)    All other components within normal limits  CULTURE, BLOOD (ROUTINE X 2)  CULTURE, BLOOD (ROUTINE X 2)  URINE CULTURE  RESP PANEL BY RT-PCR (RSV, FLU A&B, COVID)  RVPGX2  LIPASE, BLOOD  LACTIC ACID, PLASMA     RADIOLOGY I have reviewed the CT head personally interpreted no evidence of intracranial hemorrhage  PROCEDURES:  Critical Care performed: No  Procedures   MEDICATIONS ORDERED IN ED: Medications  meropenem  (MERREM ) 1 g in sodium chloride  0.9 % 100 mL IVPB (has no administration in time range)  sodium chloride  0.9 % bolus 1,000 mL (has no administration in time range)     IMPRESSION / MDM / ASSESSMENT AND PLAN / ED COURSE  I reviewed the triage vital signs and the nursing notes.   Patient's presentation is most consistent with acute presentation with potential threat to life or bodily function.   Patient comes in with concerns for increasing fatigue altered mental status with possible UTI.  Workup was done to evaluate for  altered mental status including CT head evaluate for intracranial hemorrhage CT renal to make sure there is no infected kidney stones given mental status.  She does not seem to have any abdominal tenderness.  Her white count is elevated.  Labs do show slight elevation in her creatinine.  Lipase is normal lactate slightly elevated.  Urine does look consistent with UTI but does have significant RBCs in it.  Will start on meropenem  given patient's prior cultures have been resistant to ceftriaxone .  Patient's labs are notable for elevated white count, elevated lactate, urine consistent with UTI.  Urine culture is pending, blood cultures pending.  Given her altered mental status and difficulty getting medications at the facility as well as blood work will discuss with the hospitalist for admission after CT imaging.  Patient handed off to oncoming team pending CT imaging and admission.  Family updated on plan- tylenol  and 1L fluid ordered given some LE swelling.    FINAL CLINICAL IMPRESSION(S) / ED DIAGNOSES   Final diagnoses:  Lower urinary tract infectious disease  AKI (acute kidney injury) (HCC)     Rx / DC Orders   ED Discharge Orders     None        Note:  This document was prepared using Dragon voice recognition software and may include unintentional dictation errors.   Ernest Ronal BRAVO, MD 04/25/23 ZELPHA    Ernest Ronal BRAVO, MD 04/25/23 [phone]

## 2023-04-25 NOTE — Sepsis Progress Note (Signed)
 Elink monitoring for the code sepsis protocol.

## 2023-04-25 NOTE — Consult Note (Signed)
 Urology Consult   Physician requesting consult: Ronal Lewandowsky, MD  Reason for consult: Right ureteral stone, infection  History of Present Illness: Jodi Reeves is a 89 y.o. presents to the emergency department from [facility] due to concerns for altered mental status.  She has had more fatigue and confusion today.  She was found to be febrile with temperature 101.5.  CT A/P 04/25/2023 revealed 2 mm distal right ureteral stone with proximal hydronephrosis.  She is followed by Dr. Meg with history of hematuria with negative evaluation in 2019 as well as recurrent urinary tract infection and urinary frequency.  She denies any abdominal pain or flank pain. She is altered.   Past Medical History:  Diagnosis Date   Allergy    Anxiety    Balance disorder    Chronic diastolic CHF (congestive heart failure) (HCC)    a. 10/2018 Echo: EF 55-60%, diast dysfxn, nl RV fxn. RVSP 45.78mmHg. Mild-mod TR. Mod dil LA.   Colon polyps    Coronary artery disease, non-occlusive    a. LHC 11/2003: 10% LAD stenosis, 20% LCx stenosis, and 40% RCA stenosis; b. nuc stress test 12/15: small region of mild ischemia in the apical region with WMA also noted in the apical region, EF 60%. 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 CHADS2VASc = > 8 (CHF, HTN, age x 2, DM, TIA x 2, female); b. on Xarelto    SBO (small  bowel obstruction) (HCC)    1998   TIA (transient ischemic attack)    UTI (urinary tract infection)    Venous insufficiency    Ventral hernia     Past Surgical History:  Procedure Laterality Date   2nd look laparotomy  1982   ABDOMINAL HYSTERECTOMY     for ovarian cancer s/p hysterectomy total 1976 and exp lap 1980   APPENDECTOMY     BACK SURGERY     CARDIAC CATHETERIZATION  8/05   neg; a fib found    CARPAL TUNNEL RELEASE Left 03/06/2018   Procedure: CARPAL TUNNEL RELEASE ENDOSCOPIC;  Surgeon: Edie Norleen PARAS, MD;  Location: [facility];  Service: Orthopedics;  Laterality: Left;  diabetic - oral meds   CARPAL TUNNEL RELEASE     left CTS Dr.Poggi   cataract OD  2002   cataract surgery  5/07   R   CHOLECYSTECTOMY  1986   DEXA  9/04 and 1/02   EGD/dilation/colon  1/06   ESOPHAGOGASTRODUODENOSCOPY (EGD) WITH PROPOFOL  N/A 05/16/2016   Procedure: ESOPHAGOGASTRODUODENOSCOPY (EGD) WITH PROPOFOL  with dilation;  Surgeon: Rogelia Copping, MD;  Location: [facility];  Service: Endoscopy;  Laterality: N/A;   FEMORAL HERNIA REPAIR  1958   R   fracture L elbow/wrist  2000  INTRAMEDULLARY (IM) NAIL INTERTROCHANTERIC Left 10/12/2020   Procedure: INTRAMEDULLARY (IM) NAIL INTERTROCHANTRIC;  Surgeon: Marchia Drivers, MD;  Location: [facility];  Service: Orthopedics;  Laterality: Left;   intussception/obstruction  12/98   JOINT REPLACEMENT     left shoulder   kidney stone x2  1991   KYPHOPLASTY N/A 04/13/2020   Procedure: [id number];  Surgeon: Kathlynn Sharper, MD;  Location: [facility];  Service: Orthopedics;  Laterality: N/A;   KYPHOPLASTY N/A 05/18/2020   Procedure: L1 KYPHOPLASTY;  Surgeon: Kathlynn Sharper, MD;  Location: [facility];  Service: Orthopedics;  Laterality: N/A;   KYPHOPLASTY N/A 07/06/2020   Procedure: L4  KYPHOPLASTY;  Surgeon: Kathlynn Sharper, MD;  Location: [facility];  Service: Orthopedics;  Laterality: N/A;   laminectomy L4-5  1971   LUMBAR LAMINECTOMY     1970 ruptured disc     MOUTH SURGERY     myoview  stress  8/05   syncope (-)   REVERSE SHOULDER ARTHROPLASTY Left 10/06/2014   Procedure: REVERSE SHOULDER ARTHROPLASTY;  Surgeon: Norleen JINNY Maltos, MD;  Location: [facility];  Service: Orthopedics;  Laterality: Left;   stillbirth  1969   TOTAL ABDOMINAL HYSTERECTOMY W/ BILATERAL SALPINGOOPHORECTOMY  1980   ovarian cancer   WRIST FRACTURE SURGERY  11/05   R     Current Hospital Medications:  Home meds:  No current facility-administered medications on file prior to encounter.   Current Outpatient Medications on File Prior to Encounter  Medication Sig Dispense Refill   acetaminophen  (TYLENOL ) 500 MG tablet Take 1,000 mg by mouth every 6 (six) hours as needed.     alum & mag hydroxide-simeth (MAALOX/MYLANTA) 200-200-20 MG/5ML suspension Take 30 mLs by mouth every 4 (four) hours as needed for indigestion. 355 mL 0   apixaban  (ELIQUIS ) 5 MG TABS tablet Take 5 mg by mouth 2 (two) times daily.     Ascorbic Acid  (VITAMIN C ) 1000 MG tablet Take 2,000 mg by mouth daily.     aspirin  325 MG tablet Take 325 mg by mouth daily.     bacitracin 500 UNIT/GM ointment Apply 1 Application topically daily. (Patient not taking: Reported on 12/21/2022)     bisoprolol  (ZEBETA ) 5 MG tablet Take 5 mg by mouth in the morning and at bedtime.     CALCIUM  ANTACID EXTRA STRENGTH 750 MG chewable tablet Chew by mouth.     diclofenac Sodium (VOLTAREN) 1 % GEL Apply 2 g topically in the morning, at noon, and at bedtime.     docusate sodium  (COLACE) 100 MG capsule Take 100 mg by mouth daily as needed for mild constipation. (Patient not taking: Reported on 12/21/2022)     estradiol  (ESTRACE ) 0.1 MG/GM vaginal cream APPLY 0.5MG  (PEA SIZED AMOUNT) JUST INSIDE THE VAGINA WITH FINGER-TIP ON MONDAY, WEDNESDAY AND FRIDAY NIGHTS. 42.5 g 0   famotidine  (PEPCID ) 20 MG tablet Take 20 mg by mouth at bedtime. (Patient not taking: Reported on 12/21/2022)     fluticasone  (FLONASE ) 50 MCG/ACT nasal spray Place 1 spray  into both nostrils daily.     folic acid  (FOLVITE ) 1 MG tablet Take 1 tablet (1 mg total) by mouth daily. (Patient taking differently: Take 1 mg by mouth every other day.)     furosemide  (LASIX ) 20 MG tablet Take 1 tablet (20 mg total) by mouth 2 (two) times daily. 60 tablet 5   glucose blood (ONE TOUCH ULTRA TEST) test strip Use to test blood sugar once daily E11.49 100 each 1   insulin  aspart (NOVOLOG ) 100 UNIT/ML  injection Inject 0-15 Units into the skin 4 (four) times daily -  before meals and at bedtime. (Patient taking differently: Inject 0-15 Units into the skin 4 (four) times daily. 200-249=2u, 250-299=4u, 300-349=6u, 350-399=8u, 400-449=10u, 450-499=12u, if 500 or greater=call MD) 10 mL 11   insulin  glargine (LANTUS ) 100 UNIT/ML injection Inject 0.1 mLs (10 Units total) into the skin daily. (Patient taking differently: Inject 8 Units into the skin daily.) 10 mL 11   insulin  glargine-yfgn (SEMGLEE ) 100 UNIT/ML Pen Inject into the skin. (Patient not taking: Reported on 08/25/2021)     levothyroxine  (SYNTHROID , LEVOTHROID) 50 MCG tablet Take 50 mcg by mouth daily before breakfast.     magnesium  hydroxide (MILK OF MAGNESIA) 400 MG/5ML suspension Take 30 mLs by mouth daily as needed for mild constipation. 355 mL 0   Magnesium  Oxide 250 MG TABS Take 250 mg by mouth daily.     Multiple Vitamins-Minerals (MULTIVITAMIN GUMMIES ADULT PO) Take 1 each by mouth daily.     naproxen  sodium (ALEVE ) 220 MG tablet Take 220 mg by mouth daily as needed.     nystatin powder Apply 1 Application topically 3 (three) times daily as needed.     Olopatadine  HCl 0.2 % SOLN Place 1 drop into both eyes daily as needed (Allergies).     Omega-3 Fatty Acids (FISH OIL) 1000 MG CAPS Take by mouth.     ondansetron  (ZOFRAN ) 4 MG tablet Take 1 tablet (4 mg total) by mouth every 6 (six) hours as needed for nausea. 20 tablet 0   psyllium (METAMUCIL) 58.6 % packet Take 1 packet by mouth daily as needed (constipation).     pyridOXINE   (B-6) 50 MG tablet Take 1 tablet (50 mg total) by mouth daily.     rivaroxaban  (XARELTO ) 20 MG TABS tablet Take 1 tablet (20 mg total) by mouth daily with supper. (Patient not taking: Reported on 12/21/2022) 30 tablet 11   simethicone (MYLICON) 80 MG chewable tablet Chew 80 mg by mouth every 6 (six) hours as needed for flatulence.     Skin Protectants, Misc. (DIMETHICONE-ZINC OXIDE) cream Apply topically 2 (two) times daily as needed for dry skin. Please label the bottle for the vaginal area only. 142 g 0   SODIUM FLUORIDE 5000 PPM 1.1 % GEL dental gel Take by mouth.     TRADJENTA  5 MG TABS tablet Take 5 mg by mouth daily.     vitamin B-12 (CYANOCOBALAMIN ) 1000 MCG tablet Take 1,000 mcg by mouth daily. W/ Folate and B6 sublingual       Scheduled Meds: Continuous Infusions: PRN Meds:.  Allergies:  Allergies  Allergen Reactions   Amiodarone  Other (See Comments)    Severe Thyroid  issues    Fosamax [Alendronate]     dysphagia   Penicillins Anaphylaxis and Other (See Comments)    TOLERATED CEFAZOLIN  07/06/20 Has patient had a PCN reaction causing immediate rash, facial/tongue/throat swelling, SOB or lightheadedness with hypotension: Yes Has patient had a PCN reaction causing severe rash involving mucus membranes or skin necrosis: No Has patient had a PCN reaction that required hospitalization: No Has patient had a PCN reaction occurring within the last 10 years: No If all of the above answers are NO, then may proceed with Cephalosporin use.    Sulfa Antibiotics Itching and Rash    Itching    Actos [Pioglitazone]     Not effective    Amaryl [Glimepiride]     Not effective     Atorvastatin Other (See Comments)  Muscle aches & All statins per Patient   Bentyl  [Dicyclomine  Hcl]     Could not tolerate nausea, reduced concentration, h/a    Ciprofloxacin  Other (See Comments)    High blood pressure     Codeine Nausea And Vomiting   Ezetimibe-Simvastatin Other (See Comments)     Muscle aches, nausea, back pain    Lovastatin Other (See Comments)    Myalgias   Macrobid  [Nitrofurantoin  Macrocrystal]     Severe Itching, rash    Protonix  [Pantoprazole  Sodium]     Esophageal problems  Wants removed from list   Rosiglitazone Maleate Other (See Comments)    Edema   Statins     Muscle and joint aches to all statins   Victoza [Liraglutide]     Nausea    Alendronate Sodium Rash    Dysphagia and ulceration    Bactrim [Sulfamethoxazole-Trimethoprim] Rash    itching    Family History  Problem Relation Age of Onset   Early death Father    Diabetes Mother    Heart disease Mother    Arthritis Brother    Cancer Brother    Depression Brother    Hearing loss Brother    Arthritis Brother    Cancer Brother        colon   Hearing loss Brother    Cancer Brother    Diabetes Brother    Hearing loss Brother    Heart disease Brother    Hyperlipidemia Brother    Heart disease Sister    Hypertension Sister    Stroke Sister    Hearing loss Daughter    Hypertension Daughter    Diabetes Paternal Grandfather    Hearing loss Sister    Heart disease Sister    Arthritis Sister    Depression Sister    Hearing loss Sister    Heart disease Sister    COPD Brother    Early death Brother    Early death Brother     Social History:  reports that she has never smoked. She has never used smokeless tobacco. She reports that she does not drink alcohol  and does not use drugs.  ROS: A complete review of systems was performed.  All systems are negative except for pertinent findings as noted.  Physical Exam:  Vital signs in last 24 hours: Temp:  [98.5 F (36.9 C)-101.5 F (38.6 C)] 98.5 F (36.9 C) (01/01 2139) Pulse Rate:  [79-82] 79 (01/01 2208) Resp:  [16-18] 18 (01/01 2208) BP: (104-117)/(58-82) 104/58 (01/01 2208) SpO2:  [95 %-99 %] 95 % (01/01 2208) Constitutional:  Alert and oriented, No acute distress Cardiovascular: Regular rate and rhythm Respiratory: Normal  respiratory effort, Lungs clear bilaterally GI: Abdomen is soft, nontender, nondistended, no abdominal masses GU: No CVA tenderness Neurologic: Grossly intact, no focal deficits Psychiatric: Normal mood and affect  Laboratory Data:  Recent Labs    04/25/23 1803  WBC 12.2*  HGB 12.7  HCT 37.2  PLT 164    Recent Labs    04/25/23 1803  NA 132*  K 3.7  CL 95*  GLUCOSE 143*  BUN 22  CALCIUM  8.9  CREATININE 1.26*     Results for orders placed or performed during the hospital encounter of 04/25/23 (from the past 24 hours)  CBC with Differential     Status: Abnormal   Collection Time: 04/25/23  6:03 PM  Result Value Ref Range   WBC 12.2 (H) 4.0 - 10.5 K/uL   RBC 3.76 (L) 3.87 -  5.11 MIL/uL   Hemoglobin 12.7 12.0 - 15.0 g/dL   HCT 62.7 63.9 - 53.9 %   MCV 98.9 80.0 - 100.0 fL   MCH 33.8 26.0 - 34.0 pg   MCHC 34.1 30.0 - 36.0 g/dL   RDW 87.0 88.4 - 84.4 %   Platelets 164 150 - 400 K/uL   nRBC 0.0 0.0 - 0.2 %   Neutrophils Relative % 82 %   Neutro Abs 10.0 (H) 1.7 - 7.7 K/uL   Lymphocytes Relative 9 %   Lymphs Abs 1.1 0.7 - 4.0 K/uL   Monocytes Relative 9 %   Monocytes Absolute 1.1 (H) 0.1 - 1.0 K/uL   Eosinophils Relative 0 %   Eosinophils Absolute 0.0 0.0 - 0.5 K/uL   Basophils Relative 0 %   Basophils Absolute 0.0 0.0 - 0.1 K/uL   Immature Granulocytes 0 %   Abs Immature Granulocytes 0.05 0.00 - 0.07 K/uL  Comprehensive metabolic panel     Status: Abnormal   Collection Time: 04/25/23  6:03 PM  Result Value Ref Range   Sodium 132 (L) 135 - 145 mmol/L   Potassium 3.7 3.5 - 5.1 mmol/L   Chloride 95 (L) 98 - 111 mmol/L   CO2 28 22 - 32 mmol/L   Glucose, Bld 143 (H) 70 - 99 mg/dL   BUN 22 8 - 23 mg/dL   Creatinine, Ser 8.73 (H) 0.44 - 1.00 mg/dL   Calcium  8.9 8.9 - 10.3 mg/dL   Total Protein 7.7 6.5 - 8.1 g/dL   Albumin 3.1 (L) 3.5 - 5.0 g/dL   AST 43 (H) 15 - 41 U/L   ALT 15 0 - 44 U/L   Alkaline Phosphatase 99 38 - 126 U/L   Total Bilirubin 1.0 0.0 - 1.2  mg/dL   GFR, Estimated 40 (L) >60 mL/min   Anion gap 9 5 - 15  Lipase, blood     Status: None   Collection Time: 04/25/23  6:03 PM  Result Value Ref Range   Lipase 28 11 - 51 U/L  Lactic acid, plasma     Status: Abnormal   Collection Time: 04/25/23  6:03 PM  Result Value Ref Range   Lactic Acid, Venous 2.4 (HH) 0.5 - 1.9 mmol/L  Urinalysis, Routine w reflex microscopic -Urine, Clean Catch     Status: Abnormal   Collection Time: 04/25/23  6:03 PM  Result Value Ref Range   Color, Urine YELLOW (A) YELLOW   APPearance TURBID (A) CLEAR   Specific Gravity, Urine 1.012 1.005 - 1.030   pH 5.0 5.0 - 8.0   Glucose, UA NEGATIVE NEGATIVE mg/dL   Hgb urine dipstick MODERATE (A) NEGATIVE   Bilirubin Urine NEGATIVE NEGATIVE   Ketones, ur NEGATIVE NEGATIVE mg/dL   Protein, ur 899 (A) NEGATIVE mg/dL   Nitrite NEGATIVE NEGATIVE   Leukocytes,Ua MODERATE (A) NEGATIVE   RBC / HPF >50 0 - 5 RBC/hpf   WBC, UA >50 0 - 5 WBC/hpf   Bacteria, UA MANY (A) NONE SEEN   Squamous Epithelial / HPF 0-5 0 - 5 /HPF   WBC Clumps PRESENT    Mucus PRESENT   Resp panel by RT-PCR (RSV, Flu A&B, Covid) Urine, Clean Catch     Status: None   Collection Time: 04/25/23  6:03 PM   Specimen: Urine, Clean Catch; Nasal Swab  Result Value Ref Range   SARS Coronavirus 2 by RT PCR NEGATIVE NEGATIVE   Influenza A by PCR NEGATIVE NEGATIVE   Influenza  B by PCR NEGATIVE NEGATIVE   Resp Syncytial Virus by PCR NEGATIVE NEGATIVE   Recent Results (from the past 240 hours)  Resp panel by RT-PCR (RSV, Flu A&B, Covid) Urine, Clean Catch     Status: None   Collection Time: 04/25/23  6:03 PM   Specimen: Urine, Clean Catch; Nasal Swab  Result Value Ref Range Status   SARS Coronavirus 2 by RT PCR NEGATIVE NEGATIVE Final    Comment: (NOTE) SARS-CoV-2 target nucleic acids are NOT DETECTED.  The SARS-CoV-2 RNA is generally detectable in upper respiratory specimens during the acute phase of infection. The lowest concentration of  SARS-CoV-2 viral copies this assay can detect is 138 copies/mL. A negative result does not preclude SARS-Cov-2 infection and should not be used as the sole basis for treatment or other patient management decisions. A negative result may occur with  improper specimen collection/handling, submission of specimen other than nasopharyngeal swab, presence of viral mutation(s) within the areas targeted by this assay, and inadequate number of viral copies(<138 copies/mL). A negative result must be combined with clinical observations, patient history, and epidemiological information. The expected result is Negative.  Fact Sheet for Patients:  bloggercourse.com  Fact Sheet for Healthcare Providers:  seriousbroker.it  This test is no t yet approved or cleared by the [place]  FDA and  has been authorized for detection and/or diagnosis of SARS-CoV-2 by FDA under an Emergency Use Authorization (EUA). This EUA will remain  in effect (meaning this test can be used) for the duration of the COVID-19 declaration under Section 564(b)(1) of the Act, 21 U.S.C.section 360bbb-3(b)(1), unless the authorization is terminated  or revoked sooner.       Influenza A by PCR NEGATIVE NEGATIVE Final   Influenza B by PCR NEGATIVE NEGATIVE Final    Comment: (NOTE) The Xpert Xpress SARS-CoV-2/FLU/RSV plus assay is intended as an aid in the diagnosis of influenza from Nasopharyngeal swab specimens and should not be used as a sole basis for treatment. Nasal washings and aspirates are unacceptable for Xpert Xpress SARS-CoV-2/FLU/RSV testing.  Fact Sheet for Patients: bloggercourse.com  Fact Sheet for Healthcare Providers: seriousbroker.it  This test is not yet approved or cleared by the [place]  FDA and has been authorized for detection and/or diagnosis of SARS-CoV-2 by FDA under an Emergency Use  Authorization (EUA). This EUA will remain in effect (meaning this test can be used) for the duration of the COVID-19 declaration under Section 564(b)(1) of the Act, 21 U.S.C. section 360bbb-3(b)(1), unless the authorization is terminated or revoked.     Resp Syncytial Virus by PCR NEGATIVE NEGATIVE Final    Comment: (NOTE) Fact Sheet for Patients: bloggercourse.com  Fact Sheet for Healthcare Providers: seriousbroker.it  This test is not yet approved or cleared by the [place]  FDA and has been authorized for detection and/or diagnosis of SARS-CoV-2 by FDA under an Emergency Use Authorization (EUA). This EUA will remain in effect (meaning this test can be used) for the duration of the COVID-19 declaration under Section 564(b)(1) of the Act, 21 U.S.C. section 360bbb-3(b)(1), unless the authorization is terminated or revoked.  Performed at [facility], [address] [place], [place] [address]     Renal Function: Recent Labs    04/25/23 1803  CREATININE 1.26*   CrCl cannot be calculated (Unknown ideal weight.).  Radiologic Imaging: DG OR UROLOGY CYSTO IMAGE ([facility] ONLY) Result Date: 04/25/2023 There is no interpretation for this exam.  This order is for images obtained during a surgical procedure.  Please See Surgeries Tab for more information regarding the procedure.   DG OR UROLOGY CYSTO IMAGE ([facility] ONLY) Result Date: 04/25/2023 There is no interpretation for this exam.  This order is for images obtained during a surgical procedure.  Please See Surgeries Tab for more information regarding the procedure.   DG OR UROLOGY CYSTO IMAGE ([facility] ONLY) Result Date: 04/25/2023 There is no interpretation for this exam.  This order is for images obtained during a surgical procedure.  Please See Surgeries Tab for more information regarding the procedure.   CT Renal Stone Study Result Date: 04/25/2023 CLINICAL DATA:   Abdominal/flank pain, stone suspected EXAM: CT ABDOMEN AND PELVIS WITHOUT CONTRAST TECHNIQUE: Multidetector CT imaging of the abdomen and pelvis was performed following the standard protocol without IV contrast. RADIATION DOSE REDUCTION: This exam was performed according to the departmental dose-optimization program which includes automated exposure control, adjustment of the mA and/or kV according to patient size and/or use of iterative reconstruction technique. COMPARISON:  CT abdomen pelvis 01/21/2018 FINDINGS: Lower chest: Small hiatal hernia. Cardiomegaly. Coronary artery calcification. Hepatobiliary: No focal liver abnormality. Status post cholecystectomy. No biliary dilatation. Pancreas: Diffusely atrophic. No focal lesion. Otherwise normal pancreatic contour. No surrounding inflammatory changes. No main pancreatic ductal dilatation. Spleen: Normal in size without focal abnormality. Adrenals/Urinary Tract: No adrenal nodule bilaterally. Right mild hydroureteronephrosis with 2 mm calcified stone within the mid to distal right ureter (2:51 6:47). No right nephrolithiasis. Punctate left nephrolithiasis. No left ureterolithiasis. No left hydronephrosis. The urinary bladder is unremarkable. Stomach/Bowel: Stomach is within normal limits. No evidence of bowel wall thickening or dilatation. Colonic diverticulosis. The appendix is not definitely identified with no inflammatory changes in the right lower quadrant to suggest acute appendicitis. Vascular/Lymphatic: No abdominal aorta or iliac aneurysm. Severe atherosclerotic plaque of the aorta and its branches. No abdominal, pelvic, or inguinal lymphadenopathy. Reproductive: Status post hysterectomy. No adnexal masses. Other: Chronic periaortic retroperitoneal surgical hardware versus retained radiopaque foreign bodies. No intraperitoneal free fluid. No intraperitoneal free gas. No organized fluid collection. Musculoskeletal: No abdominal wall hernia or abnormality.  Diffusely decreased bone density. No suspicious lytic or blastic osseous lesions. No acute displaced fracture. Multilevel severe degenerative changes of the spine. T12, L1, L4 compression fracture status post kyphoplasty. Chronic vertebral height loss at the L5 level. Partially visualized intramedullary nail fixation of the left femur. IMPRESSION: 1. Obstructive 2 mm mid to distal right ureterolithiasis. 2. Nonobstructive punctate left nephrolithiasis. 3. Small hiatal hernia. 4.  Aortic Atherosclerosis (ICD10-I70.0). Electronically Signed   By: Morgane  Naveau M.D.   On: 04/25/2023 19:28   DG Chest Port 1 View Result Date: 04/25/2023 CLINICAL DATA:  Fever EXAM: PORTABLE CHEST 1 VIEW COMPARISON:  01/12/2022 FINDINGS: Single frontal view of the chest demonstrates a stable cardiac silhouette. Areas of linear consolidation are seen at the left lung base, favor scarring or atelectasis. No acute airspace disease, effusion, or pneumothorax. No acute bony abnormalities. Stable postsurgical changes of the left shoulder and thoracolumbar spine. IMPRESSION: 1. Linear consolidation within the left lung base, favor scarring or atelectasis over pneumonia. 2. Otherwise stable exam. Electronically Signed   By: Ozell Daring M.D.   On: 04/25/2023 19:14   CT HEAD WO CONTRAST ( ) Result Date: 04/25/2023 CLINICAL DATA:  Headache, new onset (Age >= 51y) EXAM: CT HEAD WITHOUT CONTRAST TECHNIQUE: Contiguous axial images were obtained from the base of the skull through the vertex without intravenous contrast. RADIATION DOSE REDUCTION: This exam was performed according to the departmental dose-optimization program which  includes automated exposure control, adjustment of the mA and/or kV according to patient size and/or use of iterative reconstruction technique. COMPARISON:  Head 01/12/2022. CT FINDINGS: Brain: No evidence of acute infarction, hemorrhage, hydrocephalus, extra-axial collection or mass lesion/mass effect. Cerebral  atrophy. Vascular: No hyperdense vessel.  Calcific atherosclerosis. Skull: No acute fracture. Sinuses/Orbits: Clear sinuses.  No acute orbital findings. Other: No mastoid effusions. IMPRESSION: No evidence of acute intracranial abnormality. Electronically Signed   By: Gilmore GORMAN Molt M.D.   On: 04/25/2023 19:11    I independently reviewed the above imaging studies.  Impression/Recommendation: Distal right ureteral stone with obstruction and infection  -Will take to the OR today for cystoscopy, right retrograde pyelogram, right stent placement -Admit to medicine -Follow-up urine culture.  Will require at least 10 days culture specific antibiotics. -Will require definitive stone treatment as an outpatient.  -The risks, benefits and alternatives of cystoscopy with right JJ stent placement was discussed with the patient and patient's son, Jodi Reeves and he consented to the procedure.  Risks include, but are not limited to: bleeding, urinary tract infection, ureteral injury, ureteral stricture disease, chronic pain, urinary symptoms, bladder injury, stent migration, the need for nephrostomy tube placement, MI, CVA, DVT, PE and the inherent risks with general anesthesia.  The patient voices understanding and wishes to proceed.   Matt R. Antoine Fiallos MD 04/25/2023, 10:10 PM  [facility]  Pager: [phone]   CC: Ronal Lewandowsky, MD

## 2023-04-25 NOTE — ED Notes (Signed)
 Pt son called and updated

## 2023-04-25 NOTE — Anesthesia Procedure Notes (Signed)
 Procedure Name: Intubation Date/Time: 04/25/2023 10:58 PM  Performed by: Erie Chock, CRNAPre-anesthesia Checklist: Patient identified, Patient being monitored, Timeout performed, Emergency Drugs available and Suction available Patient Re-evaluated:Patient Re-evaluated prior to induction Oxygen Delivery Method: Circle system utilized Preoxygenation: Pre-oxygenation with 100% oxygen Induction Type: IV induction Laryngoscope Size: 3 and McGrath Grade View: Grade I Tube type: Oral Tube size: 6.5 mm Number of attempts: 1 Airway Equipment and Method: Stylet Placement Confirmation: ETT inserted through vocal cords under direct vision, positive ETCO2 and breath sounds checked- equal and bilateral Secured at: 21 cm Tube secured with: Tape Dental Injury: Teeth and Oropharynx as per pre-operative assessment

## 2023-04-25 NOTE — ED Notes (Addendum)
Report given to OR, RN

## 2023-04-25 NOTE — H&P (Addendum)
 [place]   PATIENT NAME: Jodi Reeves    MR#:  [id number]  DATE OF BIRTH:  Mar 23, 1928  DATE OF ADMISSION:  04/25/2023  PRIMARY CARE PHYSICIAN: Perla Evalene PARAS, MD   Patient is coming from: [facility]  REQUESTING/REFERRING PHYSICIAN: Willo Dunnings, MD  CHIEF COMPLAINT:   Chief Complaint  Patient presents with  . Dysuria    HISTORY OF PRESENT ILLNESS:  Jodi Reeves is a 89 y.o. Caucasian female with medical history significant for anxiety, coronary artery disease, diastolic CHF, GERD, hypertension, dyslipidemia, hypothyroidism, TIA and IBS, who presented to the ER with acute onset of dysuria with altered mental status.  Over the past few days she has not been acting her normal self with more fatigue and more confusion.  Her urine looked cloudy when they collected at her SNF.  They thought she had a UTI but could not get her antibiotic today.  They therefore sent her to the emergency room for concern about sepsis.  She did not have reported nausea or vomiting or abdominal pain.  No reported chest pain or palpitations, cough or wheezing or hemoptysis. The patient was a fairly poor historian as she was seen post procedure and was very somnolent.  ED Course: When she came to the ER, temperature was 101.5 with otherwise normal vitals.  Respiratory rate later on was 24 and heart rate 99 then 110.  Labs revealed mild hyponatremia hypochloremia blood glucose 143, creatinine 1.26 and AST 43 with albumin 3.1 and total protein 7.7.  Lactic acid was 2.4 and later 1.1.  CBC showed leukocytosis 12.2 with neutrophilia.  Respiratory panel came back negative.  UA was positive for UTI.  Blood culture was sent. EKG as reviewed by me : None Imaging: Portable chest x-ray showed linear consolidation within the left lung base favoring scarring or atelectasis or pneumonia.  The patient was given IV meropenem  given her history of anaphylaxis to penicillin but in the OR she was given IV  Ancef .  She was also given 1 g of p.o. Tylenol  and 1 L bolus of IV normal saline in the ER.  She will be admitted to a medical telemetry bed for further evaluation and management. PAST MEDICAL HISTORY:   Past Medical History:  Diagnosis Date  . Allergy   . Anxiety   . Balance disorder   . Chronic diastolic CHF (congestive heart failure) (HCC)    a. 10/2018 Echo: EF 55-60%, diast dysfxn, nl RV fxn. RVSP 45.51mmHg. Mild-mod TR. Mod dil LA.  . Colon polyps   . Coronary artery disease, non-occlusive    a. LHC 11/2003: 10% LAD stenosis, 20% LCx stenosis, and 40% RCA stenosis; b. nuc stress test 12/15: small region of mild ischemia in the apical region with WMA also noted in the apical region, EF 60%. She declined invasive evaluation at that time  . DM2 (diabetes mellitus, type 2) (HCC)   . Fall    04/2017 see care[facility]where [facility] multiple fractures, brusing   . Falls frequently    h/o left shoudler/wrist fracture and left fingers numb  . GERD (gastroesophageal reflux disease)    esophageal web  . 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 History of chicken pox   . History of eating disorder   . HLD (hyperlipidemia)   . HTN (hypertension)   . Hypothyroidism   . IBS (irritable bowel syndrome)    diarrhea   . Incontinence of bowel   .  Osteopenia   . Osteoporosis   . Ovarian cancer (HCC)    1980  . Persistent atrial fibrillation (HCC)    a. CHADS2VASc = > 8 (CHF, HTN, age x 2, DM, TIA x 2, female); b. on Xarelto   . SBO (small bowel obstruction) (HCC)    1998  . TIA (transient ischemic attack)   . UTI (urinary tract infection)   . Venous insufficiency   . Ventral hernia     PAST SURGICAL HISTORY:   Past Surgical History:  Procedure Laterality Date  . 2nd look laparotomy  1982  . ABDOMINAL HYSTERECTOMY     for ovarian cancer s/p hysterectomy total 1976 and exp lap 1980  . APPENDECTOMY    . BACK SURGERY    . CARDIAC CATHETERIZATION  8/05   neg; a fib found   . CARPAL  TUNNEL RELEASE Left 03/06/2018   Procedure: CARPAL TUNNEL RELEASE ENDOSCOPIC;  Surgeon: Edie Norleen PARAS, MD;  Location: [facility];  Service: Orthopedics;  Laterality: Left;  diabetic - oral meds  . CARPAL TUNNEL RELEASE     left CTS Dr.Poggi  . cataract OD  2002  . cataract surgery  5/07   R  . CHOLECYSTECTOMY  1986  . DEXA  9/04 and 1/02  . EGD/dilation/colon  1/06  . ESOPHAGOGASTRODUODENOSCOPY (EGD) WITH PROPOFOL  N/A 05/16/2016   Procedure: ESOPHAGOGASTRODUODENOSCOPY (EGD) WITH PROPOFOL  with dilation;  Surgeon: Rogelia Copping, MD;  Location: [facility];  Service: Endoscopy;  Laterality: N/A;  . FEMORAL HERNIA REPAIR  1958   R  . fracture L elbow/wrist  2000  . INTRAMEDULLARY (IM) NAIL INTERTROCHANTERIC Left 10/12/2020   Procedure: INTRAMEDULLARY (IM) NAIL INTERTROCHANTRIC;  Surgeon: Marchia Drivers, MD;  Location: [facility];  Service: Orthopedics;  Laterality: Left;  . intussception/obstruction  12/98  . JOINT REPLACEMENT     left shoulder  . kidney stone x2  1991  . KYPHOPLASTY N/A 04/13/2020   Procedure: [id number];  Surgeon: Kathlynn Sharper, MD;  Location: [facility];  Service: Orthopedics;  Laterality: N/A;  . KYPHOPLASTY N/A 05/18/2020   Procedure: L1 KYPHOPLASTY;  Surgeon: Kathlynn Sharper, MD;  Location: [facility];  Service: Orthopedics;  Laterality: N/A;  . KYPHOPLASTY N/A 07/06/2020   Procedure: L4  KYPHOPLASTY;  Surgeon: Kathlynn Sharper, MD;  Location: [facility];  Service: Orthopedics;  Laterality: N/A;  . laminectomy L4-5  1971  . LUMBAR LAMINECTOMY     1970 ruptured disc   . MOUTH SURGERY    . myoview  stress  8/05   syncope (-)  . REVERSE SHOULDER ARTHROPLASTY Left 10/06/2014   Procedure: REVERSE SHOULDER ARTHROPLASTY;  Surgeon: Norleen PARAS Edie, MD;  Location: [facility];  Service: Orthopedics;  Laterality: Left;  . stillbirth  1969  . TOTAL ABDOMINAL HYSTERECTOMY W/ BILATERAL SALPINGOOPHORECTOMY  1980   ovarian cancer  . WRIST FRACTURE SURGERY  11/05   R    SOCIAL  HISTORY:   Social History   Tobacco Use  . Smoking status: Never  . Smokeless tobacco: Never  Substance Use Topics  . Alcohol  use: No    Comment: may have occasional glass of wine    FAMILY HISTORY:   Family History  Problem Relation Age of Onset  . Early death Father   . Diabetes Mother   . Heart disease Mother   . Arthritis Brother   . Cancer Brother   . Depression Brother   . Hearing loss Brother   . Arthritis Brother   . Cancer Brother  colon  . Hearing loss Brother   . Cancer Brother   . Diabetes Brother   . Hearing loss Brother   . Heart disease Brother   . Hyperlipidemia Brother   . Heart disease Sister   . Hypertension Sister   . Stroke Sister   . Hearing loss Daughter   . Hypertension Daughter   . Diabetes Paternal Grandfather   . Hearing loss Sister   . Heart disease Sister   . Arthritis Sister   . Depression Sister   . Hearing loss Sister   . Heart disease Sister   . COPD Brother   . Early death Brother   . Early death Brother     DRUG ALLERGIES:   Allergies  Allergen Reactions  . Amiodarone  Other (See Comments)    Severe Thyroid  issues   . Fosamax [Alendronate]     dysphagia  . Penicillins Anaphylaxis and Other (See Comments)    TOLERATED CEFAZOLIN  07/06/20 Has patient had a PCN reaction causing immediate rash, facial/tongue/throat swelling, SOB or lightheadedness with hypotension: Yes Has patient had a PCN reaction causing severe rash involving mucus membranes or skin necrosis: No Has patient had a PCN reaction that required hospitalization: No Has patient had a PCN reaction occurring within the last 10 years: No If all of the above answers are NO, then may proceed with Cephalosporin use.   . Sulfa Antibiotics Itching and Rash    Itching   . Actos [Pioglitazone]     Not effective   . Amaryl [Glimepiride]     Not effective    . Atorvastatin Other (See Comments)    Muscle aches & All statins per Patient  . Bentyl   [Dicyclomine  Hcl]     Could not tolerate nausea, reduced concentration, h/a   . Ciprofloxacin  Other (See Comments)    High blood pressure    . Codeine Nausea And Vomiting  . Ezetimibe-Simvastatin Other (See Comments)    Muscle aches, nausea, back pain   . Lovastatin Other (See Comments)    Myalgias  . Macrobid  [Nitrofurantoin  Macrocrystal]     Severe Itching, rash   . Protonix  [Pantoprazole  Sodium]     Esophageal problems  Wants removed from list  . Rosiglitazone Maleate Other (See Comments)    Edema  . Statins     Muscle and joint aches to all statins  . Victoza [Liraglutide]     Nausea   . Alendronate Sodium Rash    Dysphagia and ulceration   . Bactrim [Sulfamethoxazole-Trimethoprim] Rash    itching    REVIEW OF SYSTEMS:   ROS As per history of present illness. All pertinent systems were reviewed above. Constitutional, HEENT, cardiovascular, respiratory, GI, GU, musculoskeletal, neuro, psychiatric, endocrine, integumentary and hematologic systems were reviewed and are otherwise negative/unremarkable except for positive findings mentioned above in the HPI.   MEDICATIONS AT HOME:   Prior to Admission medications   Medication Sig Start Date End Date Taking? Authorizing Provider  acetaminophen  (TYLENOL ) 500 MG tablet Take 1,000 mg by mouth every 6 (six) hours as needed.   Yes [provider]  alum & mag hydroxide-simeth (MAALOX/MYLANTA) 200-200-20 MG/5ML suspension Take 30 mLs by mouth every 4 (four) hours as needed for indigestion. 10/20/20  Yes Fausto Sor A, DO  apixaban  (ELIQUIS ) 5 MG TABS tablet Take 5 mg by mouth 2 (two) times daily.   Yes [provider]  Ascorbic Acid  (VITAMIN C ) 1000 MG tablet Take 2,000 mg by mouth daily.   Yes [provider]  bisoprolol  (ZEBETA ) 5 MG tablet Take 5 mg by mouth in the morning and at bedtime.   Yes [provider]  CALCIUM  ANTACID EXTRA STRENGTH 750 MG chewable tablet Chew by mouth. 08/22/21   Yes [provider]  diclofenac Sodium (VOLTAREN) 1 % GEL Apply 2 g topically in the morning, at noon, and at bedtime.   Yes [provider]  estradiol  (ESTRACE ) 0.1 MG/GM vaginal cream APPLY 0.5MG  (PEA SIZED AMOUNT) JUST INSIDE THE VAGINA WITH FINGER-TIP ON MONDAY, WEDNESDAY AND FRIDAY NIGHTS. 07/21/20  Yes McGowan, Clotilda A, PA-C  famotidine  (PEPCID ) 20 MG tablet Take 20 mg by mouth at bedtime. 12/29/21  Yes [provider]  fluticasone  (FLONASE ) 50 MCG/ACT nasal spray Place 1 spray into both nostrils daily.   Yes [provider]  folic acid  (FOLVITE ) 1 MG tablet Take 1 tablet (1 mg total) by mouth daily. Patient taking differently: Take 1 mg by mouth every other day. 10/21/20  Yes Fausto Sor A, DO  furosemide  (LASIX ) 20 MG tablet Take 1 tablet (20 mg total) by mouth 2 (two) times daily. 10/05/20  Yes Gollan, Timothy J, MD  Multiple Vitamins-Minerals (HEALTHY EYES SUPERVISION 2) CAPS Take 1 capsule by mouth 2 (two) times daily.   Yes [provider]  vitamin B-12 (CYANOCOBALAMIN ) 1000 MCG tablet Take 1,000 mcg by mouth daily. W/ Folate and B6 sublingual   Yes [provider]  aspirin  325 MG tablet Take 325 mg by mouth daily. 12/29/21   [provider]  bacitracin 500 UNIT/GM ointment Apply 1 Application topically daily. Patient not taking: Reported on 12/21/2022    [provider]  docusate sodium  (COLACE) 100 MG capsule Take 100 mg by mouth daily as needed for mild constipation. Patient not taking: Reported on 12/21/2022    [provider]  glucose blood (ONE TOUCH ULTRA TEST) test strip Use to test blood sugar once daily E11.49 06/24/15   Bedsole, Amy E, MD  insulin  aspart (NOVOLOG ) 100 UNIT/ML injection Inject 0-15 Units into the skin 4 (four) times daily -  before meals and at bedtime. Patient taking differently: Inject 0-15 Units into the skin 4 (four) times daily. 200-249=2u, 250-299=4u, 300-349=6u, 350-399=8u,  400-449=10u, 450-499=12u, if 500 or greater=call MD 10/20/20   Fausto Sor A, DO  insulin  glargine (LANTUS ) 100 UNIT/ML injection Inject 0.1 mLs (10 Units total) into the skin daily. Patient taking differently: Inject 8 Units into the skin daily. 10/21/20   Fausto Sor LABOR, DO  insulin  glargine-yfgn (SEMGLEE ) 100 UNIT/ML Pen Inject into the skin. Patient not taking: Reported on 08/25/2021 02/25/21   [provider]  levothyroxine  (SYNTHROID , LEVOTHROID) 50 MCG tablet Take 50 mcg by mouth daily before breakfast.    [provider]  magnesium  hydroxide (MILK OF MAGNESIA) 400 MG/5ML suspension Take 30 mLs by mouth daily as needed for mild constipation. 10/20/20   Fausto Sor LABOR, DO  Magnesium  Oxide 250 MG TABS Take 250 mg by mouth daily.    [provider]  Multiple Vitamins-Minerals (MULTIVITAMIN GUMMIES ADULT PO) Take 1 each by mouth daily.    [provider]  naproxen  sodium (ALEVE ) 220 MG tablet Take 220 mg by mouth daily as needed.    [provider]  nystatin powder Apply 1 Application topically 3 (three) times daily as needed.    [provider]  Olopatadine  HCl 0.2 % SOLN Place 1 drop into both eyes daily as needed (Allergies).    [provider]  Omega-3 Fatty  Acids (FISH OIL) 1000 MG CAPS Take by mouth.    [provider]  ondansetron  (ZOFRAN ) 4 MG tablet Take 1 tablet (4 mg total) by mouth every 6 (six) hours as needed for nausea. 10/20/20   Fausto Sor A, DO  psyllium (METAMUCIL) 58.6 % packet Take 1 packet by mouth daily as needed (constipation).    [provider]  pyridOXINE  (B-6) 50 MG tablet Take 1 tablet (50 mg total) by mouth daily. 10/21/20   Fausto Sor LABOR, DO  rivaroxaban  (XARELTO ) 20 MG TABS tablet Take 1 tablet (20 mg total) by mouth daily with supper. Patient not taking: Reported on 12/21/2022 01/04/22   Abigail Bernardino HERO, PA-C  simethicone (MYLICON) 80 MG chewable tablet Chew 80 mg by mouth  every 6 (six) hours as needed for flatulence.    [provider]  Skin Protectants, Misc. (DIMETHICONE-ZINC OXIDE) cream Apply topically 2 (two) times daily as needed for dry skin. Please label the bottle for the vaginal area only. 06/29/20   Helon Kirsch A, PA-C  SODIUM FLUORIDE 5000 PPM 1.1 % GEL dental gel Take by mouth. 05/10/21   [provider]  TRADJENTA  5 MG TABS tablet Take 5 mg by mouth daily. 10/05/20   [provider]      VITAL SIGNS:  Blood pressure (!) 103/51, pulse 92, temperature 98.5 F (36.9 C), temperature source Oral, resp. rate 20, height 5' 4 (1.626 m), weight 78.1 kg, SpO2 90%.  PHYSICAL EXAMINATION:  Physical Exam  GENERAL:  89 y.o.-year-old Caucasian female patient lying in the bed with mild respiratory distress.  She was somnolent but arousable.  EYES: Pupils equal, round, reactive to light and accommodation. No scleral icterus. Extraocular muscles intact.  HEENT: Head atraumatic, normocephalic. Oropharynx and nasopharynx clear.  NECK:  Supple, no jugular venous distention. No thyroid  enlargement, no tenderness.  LUNGS: Normal breath sounds bilaterally, no wheezing, rales,rhonchi or crepitation. No use of accessory muscles of respiration.  CARDIOVASCULAR: Regular rate and rhythm, S1, S2 normal. No murmurs, rubs, or gallops.  ABDOMEN: Soft, nondistended, nontender. Bowel sounds present. No organomegaly or mass.  EXTREMITIES: No pedal edema, cyanosis, or clubbing.  NEUROLOGIC: Cranial nerves II through XII are intact. Muscle strength 5/5 in all extremities. Sensation intact. Gait not checked.  PSYCHIATRIC: The patient is somnolent but arousable.  Normal affect and good eye contact. SKIN: No obvious rash, lesion, or ulcer.   LABORATORY PANEL:   CBC Recent Labs  Lab 04/25/23 1803  WBC 12.2*  HGB 12.7  HCT 37.2  PLT 164    ------------------------------------------------------------------------------------------------------------------  Chemistries  Recent Labs  Lab 04/25/23 1803  NA 132*  K 3.7  CL 95*  CO2 28  GLUCOSE 143*  BUN 22  CREATININE 1.26*  CALCIUM  8.9  AST 43*  ALT 15  ALKPHOS 99  BILITOT 1.0   ------------------------------------------------------------------------------------------------------------------  Cardiac Enzymes No results for input(s): TROPONINI in the last 168 hours. ------------------------------------------------------------------------------------------------------------------  RADIOLOGY:  DG OR UROLOGY CYSTO IMAGE ([facility] ONLY) Result Date: 04/25/2023 There is no interpretation for this exam.  This order is for images obtained during a surgical procedure.  Please See Surgeries Tab for more information regarding the procedure.   DG OR UROLOGY CYSTO IMAGE ([facility] ONLY) Result Date: 04/25/2023 There is no interpretation for this exam.  This order is for images obtained during a surgical procedure.  Please See Surgeries Tab for more information regarding the procedure.   DG OR UROLOGY CYSTO IMAGE [facility] ONLY) Result Date: 04/25/2023 There is no  interpretation for this exam.  This order is for images obtained during a surgical procedure.  Please See Surgeries Tab for more information regarding the procedure.   DG OR UROLOGY CYSTO IMAGE ([facility] ONLY) Result Date: 04/25/2023 There is no interpretation for this exam.  This order is for images obtained during a surgical procedure.  Please See Surgeries Tab for more information regarding the procedure.   DG OR UROLOGY CYSTO IMAGE ([facility] ONLY) Result Date: 04/25/2023 There is no interpretation for this exam.  This order is for images obtained during a surgical procedure.  Please See Surgeries Tab for more information regarding the procedure.   CT Renal Stone Study Result Date: 04/25/2023 CLINICAL DATA:  Abdominal/flank  pain, stone suspected EXAM: CT ABDOMEN AND PELVIS WITHOUT CONTRAST TECHNIQUE: Multidetector CT imaging of the abdomen and pelvis was performed following the standard protocol without IV contrast. RADIATION DOSE REDUCTION: This exam was performed according to the departmental dose-optimization program which includes automated exposure control, adjustment of the mA and/or kV according to patient size and/or use of iterative reconstruction technique. COMPARISON:  CT abdomen pelvis 01/21/2018 FINDINGS: Lower chest: Small hiatal hernia. Cardiomegaly. Coronary artery calcification. Hepatobiliary: No focal liver abnormality. Status post cholecystectomy. No biliary dilatation. Pancreas: Diffusely atrophic. No focal lesion. Otherwise normal pancreatic contour. No surrounding inflammatory changes. No main pancreatic ductal dilatation. Spleen: Normal in size without focal abnormality. Adrenals/Urinary Tract: No adrenal nodule bilaterally. Right mild hydroureteronephrosis with 2 mm calcified stone within the mid to distal right ureter (2:51 6:47). No right nephrolithiasis. Punctate left nephrolithiasis. No left ureterolithiasis. No left hydronephrosis. The urinary bladder is unremarkable. Stomach/Bowel: Stomach is within normal limits. No evidence of bowel wall thickening or dilatation. Colonic diverticulosis. The appendix is not definitely identified with no inflammatory changes in the right lower quadrant to suggest acute appendicitis. Vascular/Lymphatic: No abdominal aorta or iliac aneurysm. Severe atherosclerotic plaque of the aorta and its branches. No abdominal, pelvic, or inguinal lymphadenopathy. Reproductive: Status post hysterectomy. No adnexal masses. Other: Chronic periaortic retroperitoneal surgical hardware versus retained radiopaque foreign bodies. No intraperitoneal free fluid. No intraperitoneal free gas. No organized fluid collection. Musculoskeletal: No abdominal wall hernia or abnormality. Diffusely  decreased bone density. No suspicious lytic or blastic osseous lesions. No acute displaced fracture. Multilevel severe degenerative changes of the spine. T12, L1, L4 compression fracture status post kyphoplasty. Chronic vertebral height loss at the L5 level. Partially visualized intramedullary nail fixation of the left femur. IMPRESSION: 1. Obstructive 2 mm mid to distal right ureterolithiasis. 2. Nonobstructive punctate left nephrolithiasis. 3. Small hiatal hernia. 4.  Aortic Atherosclerosis (ICD10-I70.0). Electronically Signed   By: Morgane  Naveau M.D.   On: 04/25/2023 19:28   DG Chest Port 1 View Result Date: 04/25/2023 CLINICAL DATA:  Fever EXAM: PORTABLE CHEST 1 VIEW COMPARISON:  01/12/2022 FINDINGS: Single frontal view of the chest demonstrates a stable cardiac silhouette. Areas of linear consolidation are seen at the left lung base, favor scarring or atelectasis. No acute airspace disease, effusion, or pneumothorax. No acute bony abnormalities. Stable postsurgical changes of the left shoulder and thoracolumbar spine. IMPRESSION: 1. Linear consolidation within the left lung base, favor scarring or atelectasis over pneumonia. 2. Otherwise stable exam. Electronically Signed   By: Ozell Daring M.D.   On: 04/25/2023 19:14   CT HEAD WO CONTRAST ( ) Result Date: 04/25/2023 CLINICAL DATA:  Headache, new onset (Age >= 51y) EXAM: CT HEAD WITHOUT CONTRAST TECHNIQUE: Contiguous axial images were obtained from the base of the skull through the vertex without  intravenous contrast. RADIATION DOSE REDUCTION: This exam was performed according to the departmental dose-optimization program which includes automated exposure control, adjustment of the mA and/or kV according to patient size and/or use of iterative reconstruction technique. COMPARISON:  Head 01/12/2022. CT FINDINGS: Brain: No evidence of acute infarction, hemorrhage, hydrocephalus, extra-axial collection or mass lesion/mass effect. Cerebral atrophy.  Vascular: No hyperdense vessel.  Calcific atherosclerosis. Skull: No acute fracture. Sinuses/Orbits: Clear sinuses.  No acute orbital findings. Other: No mastoid effusions. IMPRESSION: No evidence of acute intracranial abnormality. Electronically Signed   By: Gilmore GORMAN Molt M.D.   On: 04/25/2023 19:11      IMPRESSION AND PLAN:  Assessment and Plan: * Acute unilateral obstructive uropathy - This is secondary to right distal ureteral obstructing stone.  She is status post cystoscopy ureteroscopy and stent placement by Dr. Selma. - She will be admitted to a medical telemetry bed. - Will continue antibiotic therapy with IV meropenem . - We will continue hydration with IV lactated ringer . - We will follow blood and urine cultures. - Dr. Selma was consulted about the patient as mentioned above.  Sepsis due to gram-negative UTI (HCC) - Sepsis is manifested by fever, tachycardia, tachypnea and leukocytosis. - She will be covered with IV meropenem  as mentioned above and continued hydration with IV lactated ringer . - We will follow blood and urine cultures.  AKI (acute kidney injury) (HCC) - This is due to obstructive uropathy. - She be hydrated with IV lactated ringer  and will follow BMP. - There could be an element of hypovolemia given her hyponatremia and hypochloremia as well.  Type 2 diabetes mellitus with peripheral neuropathy (HCC) - The patient will be placed on supplemental coverage with NovoLog . - We will continue basal coverage. - We will continue Glucotrol  as well as Neurontin.  Hypothyroidism - We will continue Synthroid .  Paroxysmal atrial fibrillation (HCC) - We will continue Eliquis .  GERD without esophagitis - We will continue H2 blocker therapy.    DVT prophylaxis: Eliquis  Advanced Care Planning:  Code Status: She is DNR and DNI. Family Communication:  The plan of care was discussed in details with the patient (and family). I answered all questions. The patient  agreed to proceed with the above mentioned plan. Further management will depend upon hospital course. Disposition Plan: Back to previous home environment Consults called: Urology All the records are reviewed and case discussed with ED provider.  Status is: Inpatient    At the time of the admission, it appears that the appropriate admission status for this patient is inpatient.  This is judged to be reasonable and necessary in order to provide the required intensity of service to ensure the patient's safety given the presenting symptoms, physical exam findings and initial radiographic and laboratory data in the context of comorbid conditions.  The patient requires inpatient status due to high intensity of service, high risk of further deterioration and high frequency of surveillance required.  I certify that at the time of admission, it is my clinical judgment that the patient will require inpatient hospital care extending more than 2 midnights.                            Dispo: The patient is from: Home              Anticipated d/c is to: Home              Patient currently is not medically stable to d/c.  Difficult to place patient: No  Madison DELENA Peaches M.D on 04/26/2023 at 4:12 AM  [facility]   From 7 PM-7 AM, contact night-coverage [email or web address]  CC: Primary care physician; Gollan, Timothy J, MD

## 2023-04-25 NOTE — ED Triage Notes (Signed)
 Pt arrives via [facility] from [facility] because she was recently dx with UTI. Pt unable to get antibiotic today due to pharmacy being closed, so she was sent to ER to get an antibiotic. Pt endorses dysuria.

## 2023-04-25 NOTE — ED Triage Notes (Signed)
 Pt comes via EMS with c/o fatigue from [facility]. Pt did have UA done and meds called in. Facility wanted pt seen here due to not being able to get meds today. Pt able to stand and pivot with assistance. Pt states no pain or burning with urination.

## 2023-04-25 NOTE — Op Note (Signed)
 Operative Note  Preoperative diagnosis:  1.  Right ureteral stone with infection  Postoperative diagnosis: 1.  Same  Procedure(s): 1.  Cystoscopy 2. Right retrograde pyelogram with interpretation 3. Right ureteral stent placement 4. Fluoroscopy <1 hour with intraoperative interpretation  Surgeon: Donnice Siad, MD  Assistants:  None  Anesthesia:  General  Complications:  None  EBL: Minimal  Specimens: 1.  ID Type Source Tests Collected by Time Destination  A : Right renal pelvic urine Urine Urine, Catheterized URINE CULTURE Siad Donnice SAUNDERS, MD 04/25/2023 2309      Drains/Catheters: 1.  Right 6Fr x 24cm ureteral stent 2. 16 Fr foley catheter  Intraoperative findings:   Cystoscopy demonstrated no suspicious lesions, masses, stones. She had cloudy urine consistent with cystitis. Retrograde pyelogram demonstrated severe right hydronephrosis. Successful right  ureteral stent placement with curl in the renal pelvis and bladder respectively.  Indication:  NEETU CARROZZA is a 89 y.o. female with obstructing right ureteral stone with infection after reviewing the management options for treatment, she elected to proceed with the above surgical procedure(s).  The patient's son provided consent.  We have discussed the potential benefits and risks of the procedure, side effects of the proposed treatment, the likelihood of the patient achieving the goals of the procedure, and any potential problems that might occur during the procedure or recuperation. Informed consent has been obtained.  Description of procedure: The patient was taken to the operating room and general anesthesia was induced.  The patient was placed in the dorsal lithotomy position, prepped and draped in the usual sterile fashion, and preoperative antibiotics were administered. A preoperative time-out was performed.   Cystourethroscopy was performed.  The patient's urethra was examined and was normal. The bladder was then  systematically examined in its entirety. There was no evidence for any bladder tumors, stones, or other mucosal pathology.    Attention then turned to the right ureteral orifice. A 0.038 zip wire was passed through the right orifice and over the wire a 5 Fr open ended catheter was inserted and passed up to the level of the renal pelvis. Aspirate was obtained and sent off as right renal pelvis urine for culture and this was grossly purulent. Omnipaque  contrast was injected through the ureteral catheter and a retrograde pyelogram was performed with findings as dictated above. The wire was then replaced and the open ended catheter was removed.   A 6Fr x 24cm ureteral stent was advance over the wire. The stent was positioned appropriately under fluoroscopic and cystoscopic guidance.  The wire was then removed with an adequate stent curl noted in the renal pelvis as well as in the bladder.  The bladder was then emptied, foley catheter placed and the procedure ended.  The patient appeared to tolerate the procedure well and without complications.  The patient was able to be awakened and transferred to the recovery unit in satisfactory condition.   Matt R. Hellon Vaccarella MD [facility]  Pager: [phone]

## 2023-04-25 NOTE — Transfer of Care (Signed)
 Immediate Anesthesia Transfer of Care Note  Patient: Jodi Reeves  Procedure(s) Performed: CYSTOSCOPY WITH RETROGRADE PYELOGRAM, URETEROSCOPY AND STENT PLACEMENT (Right)  Patient Location: PACU  Anesthesia Type:General  Level of Consciousness: awake, alert , and oriented  Airway & Oxygen Therapy: Patient Spontanous Breathing  Post-op Assessment: Report given to RN and Post -op Vital signs reviewed and stable  Post vital signs: Reviewed and stable  Last Vitals:  Vitals Value Taken Time  BP    Temp    Pulse 98   Resp 17   SpO2 92     Last Pain:  Vitals:   04/25/23 2139  TempSrc: Oral  PainSc:          Complications: No notable events documented.

## 2023-04-25 NOTE — ED Provider Notes (Signed)
-----------------------------------------   7:17 PM on 04/25/2023 -----------------------------------------  Blood pressure 117/63, pulse 81, temperature (!) 101.5 F (38.6 C), temperature source Oral, resp. rate 18, SpO2 98%.  Assuming care from Dr. Ernest.  In short, Jodi Reeves is a 89 y.o. female with a chief complaint of Dysuria .  Refer to the original H&P for additional details.  The current plan of care is to follow-up CT results to rule out obstruction contributing to UTI.  ----------------------------------------- 9:59 PM on 04/25/2023 ----------------------------------------- CT imaging concerning for 2 mm stone at the distal right ureter with mild associated hydronephrosis.  Case discussed with Dr. Selma of urology, who will plan for ureteral stenting this evening, request admission to the hospitalist service.  Case discussed with hospitalist for admission.    Willo Dunnings, MD 04/25/23 2200

## 2023-04-25 NOTE — Progress Notes (Signed)
 CODE SEPSIS - PHARMACY COMMUNICATION  **Broad Spectrum Antibiotics should be administered within 1 hour of Sepsis diagnosis**  Time Code Sepsis Called/Page Received: 8146  Antibiotics Ordered: Meropenem   Time of 1st antibiotic administration: 1952  Rankin CANDIE Dills, PharmD, [facility] 04/25/2023 6:59 PM

## 2023-04-25 NOTE — Anesthesia Preprocedure Evaluation (Signed)
 Anesthesia Evaluation  Patient identified by MRN, date of birth, ID band Patient awake  General Assessment Comment: Last took xarelto  Saturday 9pm (less than 72hrs)  Reviewed: Allergy & Precautions, NPO status , Patient's Chart, lab work & pertinent test results  History of Anesthesia Complications Negative for: history of anesthetic complications  Airway Mallampati: III  TM Distance: >3 FB Neck ROM: Full    Dental  (+) Teeth Intact, Poor Dentition, Dental Advidsory Given   Pulmonary neg pulmonary ROS, neg sleep apnea, neg COPD, Patient abstained from smoking.Not current smoker   Pulmonary exam normal breath sounds clear to auscultation       Cardiovascular Exercise Tolerance: Poor METShypertension, (-) angina + CAD, + Peripheral Vascular Disease and +CHF  (-) Past MI and (-) Cardiac Stents + dysrhythmias Atrial Fibrillation + Valvular Problems/Murmurs  Rhythm:Irregular Rate:Normal - Systolic murmurs 1. The left ventricle has normal systolic function, with an ejection  fraction of 55-60%. The cavity size was normal. Left ventricular diastolic  Doppler parameters are consistent with impaired relaxation.  2. The right ventricle has normal systolic function. The cavity was  normal. There is no increase in right ventricular wall thickness. Right  ventricular systolic pressure is moderately elevated with an estimated  pressure of 45.8 mmHg.  3. Tricuspid valve regurgitation is mild-moderate.  4. Left atrial size was moderately dilated.  5. Rhythm is atrial fibrillation    Neuro/Psych neg Seizures PSYCHIATRIC DISORDERS Anxiety     TIA Neuromuscular disease    GI/Hepatic ,GERD  ,,(+)     (-) substance abuse    Endo/Other  diabetesHypothyroidism    Renal/GU negative Renal ROS     Musculoskeletal   Abdominal   Peds  Hematology   Anesthesia Other Findings Past Medical History: No date: Allergy No date: Anxiety No  date: Balance disorder No date: Chronic diastolic CHF (congestive heart failure) (HCC) No date: Colon polyps No date: Coronary artery disease, non-occlusive     Comment:  a. LHC 11/2003: 10% LAD stenosis, 20% LCx stenosis, and               40% RCA stenosis; b. nuc stress test 12/15: small region               of mild ischemia in the apical region with WMA also noted              in the apical region, EF 60%. 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 CHADS2VASc = > 8 (CHF, HTN, age x  2, DM, TIA x 2,               female); b. on Xarelto  No date: SBO (small bowel obstruction) (HCC)     Comment:  1998 No date: TIA (transient ischemic attack) No date: UTI (urinary tract infection) No date: Venous insufficiency  Reproductive/Obstetrics                             Anesthesia Physical Anesthesia Plan  ASA: 3  Anesthesia Plan: General   Post-op Pain Management:    Induction: Intravenous  PONV Risk Score and Plan: 4 or greater and Ondansetron , Dexamethasone  and Treatment may vary due to age or medical condition  Airway Management Planned: Oral ETT and LMA  Additional Equipment:  None  Intra-op Plan:   Post-operative Plan: Extubation in OR  Informed Consent: I have reviewed the patients History and Physical, chart, labs and discussed the procedure including the risks, benefits and alternatives for the proposed anesthesia with the patient or authorized representative who has indicated his/her understanding and acceptance.     Dental advisory given  Plan Discussed with: CRNA and Surgeon  Anesthesia Plan Comments: (Discussed risks of anesthesia with patient with son at bedside, including possibility of difficulty with spontaneous ventilation under anesthesia necessitating airway intervention, PONV, Post operative cognitive dysfunction  and rare risks such as cardiac or respiratory or neurological events. Patient understands.)        Anesthesia Quick Evaluation                                  Anesthesia Evaluation  Patient identified by MRN, date of birth, ID band Patient awake    Reviewed: Allergy & Precautions, NPO status , Patient's Chart, lab work & pertinent test results  History of Anesthesia Complications Negative for: history of anesthetic complications  Airway Mallampati: III  TM Distance: >3 FB Neck ROM: Full    Dental   Bridges :   Pulmonary neg pulmonary ROS,    Pulmonary exam normal breath sounds clear to auscultation       Cardiovascular hypertension, + CAD and +CHF  + dysrhythmias (a fib on xarelto )  Rhythm:Irregular Rate:Normal  ECG 08/21/17: atrial fibrillation; left axis deviation; septal infarct, age undetermined  Myocardial perfusion 04/06/17:   Horizontal ST segment depression ST segment depression of 1 mm was noted during stress in the II, III and aVF leads.  The study is normal.  This is a low risk study.  The left ventricular ejection fraction is hyperdynamic (>65%).   Neuro/Psych PSYCHIATRIC DISORDERS Anxiety Depression TIAnegative neurological ROS     GI/Hepatic GERD  ,Esophageal web, hx SBO,  IBS   Endo/Other  diabetes, Type 2Hypothyroidism   Renal/GU negative Renal ROS     Musculoskeletal   Abdominal   Peds  Hematology negative hematology ROS (+)   Anesthesia Other Findings Cardiology note 08/21/17:  ASSESSMENT AND PLAN:   Mixed hyperlipidemia  CT scan including mild coronary calcifications, aortic arch calcifications, moderate aortic atherosclerosis in the abdominal aorta  does not want a statin stable  No further work-up   Essential hypertension, benign - Plan: EKG 12-Lead Blood pressure is well controlled on today's visit. No changes made to the medications.   Persistent atrial fibrillation (HCC) Heart rate well controlled,  Tolerating Xarelto , no recent falls No changes made   Dark urine She is concerned about  hematuria but no proof of this Recommended if she has any other days with very dark urine that she contact primary care, may need urinalysis  if she does have confirmation of hematuria would likely benefit from urology but currently reports urine is back to normal color   Chronic diastolic CHF (congestive heart failure) (HCC) Takes Lasix  daily, occasionally with extra dose potassium daily Appears euvolemic    Total encounter time more than 45 minutes  Greater than 50% was spent in counseling and coordination of care with the patient   Disposition:   F/U  12 months  Reproductive/Obstetrics                            Anesthesia Physical Anesthesia Plan  ASA: III  Anesthesia Plan: General and Bier Block and Bier Block-LIDOCAINE  ONLY   Post-op Pain Management:  [facility] for Post-op pain and GA combined w/ Regional for post-op pain   Induction: Intravenous  PONV Risk Score and Plan: 3 and Propofol  infusion and TIVA  Airway Management Planned: Natural Airway  Additional Equipment:   Intra-op Plan:   Post-operative Plan:   Informed Consent: I have reviewed the patients History and Physical, chart, labs and  discussed the procedure including the risks, benefits and alternatives for the proposed anesthesia with the patient or authorized representative who has indicated his/her understanding and acceptance.     Plan Discussed with: CRNA  Anesthesia Plan Comments:         Anesthesia Quick Evaluation

## 2023-04-26 ENCOUNTER — Other Ambulatory Visit: Payer: Self-pay | Admitting: Urology

## 2023-04-26 ENCOUNTER — Inpatient Hospital Stay: Payer: Medicare HMO

## 2023-04-26 ENCOUNTER — Encounter: Payer: Self-pay | Admitting: Urology

## 2023-04-26 ENCOUNTER — Emergency Department: Payer: Medicare HMO

## 2023-04-26 DIAGNOSIS — E1142 Type 2 diabetes mellitus with diabetic polyneuropathy: Secondary | ICD-10-CM

## 2023-04-26 DIAGNOSIS — N179 Acute kidney failure, unspecified: Secondary | ICD-10-CM | POA: Diagnosis not present

## 2023-04-26 DIAGNOSIS — E876 Hypokalemia: Secondary | ICD-10-CM | POA: Insufficient documentation

## 2023-04-26 DIAGNOSIS — N200 Calculus of kidney: Secondary | ICD-10-CM

## 2023-04-26 DIAGNOSIS — N139 Obstructive and reflux uropathy, unspecified: Secondary | ICD-10-CM

## 2023-04-26 DIAGNOSIS — B9620 Unspecified Escherichia coli [E. coli] as the cause of diseases classified elsewhere: Secondary | ICD-10-CM

## 2023-04-26 DIAGNOSIS — R7881 Bacteremia: Secondary | ICD-10-CM | POA: Diagnosis not present

## 2023-04-26 DIAGNOSIS — R6521 Severe sepsis with septic shock: Secondary | ICD-10-CM

## 2023-04-26 DIAGNOSIS — I480 Paroxysmal atrial fibrillation: Secondary | ICD-10-CM | POA: Diagnosis not present

## 2023-04-26 DIAGNOSIS — K219 Gastro-esophageal reflux disease without esophagitis: Secondary | ICD-10-CM | POA: Insufficient documentation

## 2023-04-26 DIAGNOSIS — A419 Sepsis, unspecified organism: Secondary | ICD-10-CM

## 2023-04-26 LAB — CBC
HCT: 32.6 % — ABNORMAL LOW (ref 36.0–46.0)
HCT: 32.8 % — ABNORMAL LOW (ref 36.0–46.0)
Hemoglobin: 11.1 g/dL — ABNORMAL LOW (ref 12.0–15.0)
Hemoglobin: 11.2 g/dL — ABNORMAL LOW (ref 12.0–15.0)
MCH: 34.5 pg — ABNORMAL HIGH (ref 26.0–34.0)
MCH: 34.1 pg — ABNORMAL HIGH (ref 26.0–34.0)
MCHC: 34 g/dL (ref 30.0–36.0)
MCHC: 34.1 g/dL (ref 30.0–36.0)
MCV: 101.2 fL — ABNORMAL HIGH (ref 80.0–100.0)
MCV: 100 fL (ref 80.0–100.0)
Platelets: 97 10*3/uL — ABNORMAL LOW (ref 150–400)
Platelets: 93 10*3/uL — ABNORMAL LOW (ref 150–400)
RBC: 3.22 MIL/uL — ABNORMAL LOW (ref 3.87–5.11)
RBC: 3.28 MIL/uL — ABNORMAL LOW (ref 3.87–5.11)
RDW: 13.1 % (ref 11.5–15.5)
RDW: 13.5 % (ref 11.5–15.5)
WBC: 9.4 10*3/uL (ref 4.0–10.5)
WBC: 27.9 10*3/uL — ABNORMAL HIGH (ref 4.0–10.5)
nRBC: 0 % (ref 0.0–0.2)
nRBC: 0 % (ref 0.0–0.2)

## 2023-04-26 LAB — BLOOD GAS, ARTERIAL
Acid-base deficit: 4.9 mmol/L — ABNORMAL HIGH (ref 0.0–2.0)
Acid-base deficit: 8.6 mmol/L — ABNORMAL HIGH (ref 0.0–2.0)
Bicarbonate: 17.4 mmol/L — ABNORMAL LOW (ref 20.0–28.0)
Bicarbonate: 14.5 mmol/L — ABNORMAL LOW (ref 20.0–28.0)
FIO2: 0.21 %
O2 Saturation: 96.9 %
O2 Saturation: 97.8 %
Patient temperature: 37
Patient temperature: 37
pCO2 arterial: 25 mm[Hg] — ABNORMAL LOW (ref 32–48)
pCO2 arterial: 24 mm[Hg] — ABNORMAL LOW (ref 32–48)
pH, Arterial: 7.45 (ref 7.35–7.45)
pH, Arterial: 7.39 (ref 7.35–7.45)
pO2, Arterial: 72 mm[Hg] — ABNORMAL LOW (ref 83–108)
pO2, Arterial: 73 mm[Hg] — ABNORMAL LOW (ref 83–108)

## 2023-04-26 LAB — GLUCOSE, CAPILLARY
Glucose-Capillary: 155 mg/dL — ABNORMAL HIGH (ref 70–99)
Glucose-Capillary: 140 mg/dL — ABNORMAL HIGH (ref 70–99)
Glucose-Capillary: 171 mg/dL — ABNORMAL HIGH (ref 70–99)
Glucose-Capillary: 186 mg/dL — ABNORMAL HIGH (ref 70–99)
Glucose-Capillary: 193 mg/dL — ABNORMAL HIGH (ref 70–99)

## 2023-04-26 LAB — BLOOD CULTURE ID PANEL (REFLEXED) - BCID2

## 2023-04-26 LAB — BASIC METABOLIC PANEL
Anion gap: 15 (ref 5–15)
Anion gap: 17 — ABNORMAL HIGH (ref 5–15)
Anion gap: 19 — ABNORMAL HIGH (ref 5–15)
BUN: 25 mg/dL — ABNORMAL HIGH (ref 8–23)
BUN: 28 mg/dL — ABNORMAL HIGH (ref 8–23)
BUN: 35 mg/dL — ABNORMAL HIGH (ref 8–23)
CO2: 19 mmol/L — ABNORMAL LOW (ref 22–32)
CO2: 18 mmol/L — ABNORMAL LOW (ref 22–32)
CO2: 14 mmol/L — ABNORMAL LOW (ref 22–32)
Calcium: 7.5 mg/dL — ABNORMAL LOW (ref 8.9–10.3)
Calcium: 7.6 mg/dL — ABNORMAL LOW (ref 8.9–10.3)
Calcium: 7.8 mg/dL — ABNORMAL LOW (ref 8.9–10.3)
Chloride: 99 mmol/L (ref 98–111)
Chloride: 98 mmol/L (ref 98–111)
Chloride: 99 mmol/L (ref 98–111)
Creatinine, Ser: 1.35 mg/dL — ABNORMAL HIGH (ref 0.44–1.00)
Creatinine, Ser: 1.88 mg/dL — ABNORMAL HIGH (ref 0.44–1.00)
Creatinine, Ser: 2.23 mg/dL — ABNORMAL HIGH (ref 0.44–1.00)
GFR, Estimated: 37 mL/min — ABNORMAL LOW (ref >60)
GFR, Estimated: 25 mL/min — ABNORMAL LOW (ref >60)
GFR, Estimated: 20 mL/min — ABNORMAL LOW (ref >60)
Glucose, Bld: 131 mg/dL — ABNORMAL HIGH (ref 70–99)
Glucose, Bld: 172 mg/dL — ABNORMAL HIGH (ref 70–99)
Glucose, Bld: 224 mg/dL — ABNORMAL HIGH (ref 70–99)
Potassium: 3.3 mmol/L — ABNORMAL LOW (ref 3.5–5.1)
Potassium: 3.6 mmol/L (ref 3.5–5.1)
Potassium: 3.7 mmol/L (ref 3.5–5.1)
Sodium: 133 mmol/L — ABNORMAL LOW (ref 135–145)
Sodium: 133 mmol/L — ABNORMAL LOW (ref 135–145)
Sodium: 132 mmol/L — ABNORMAL LOW (ref 135–145)

## 2023-04-26 LAB — LACTIC ACID, PLASMA
Lactic Acid, Venous: 6.3 mmol/L (ref 0.5–1.9)
Lactic Acid, Venous: 5.6 mmol/L (ref 0.5–1.9)
Lactic Acid, Venous: 5.9 mmol/L (ref 0.5–1.9)
Lactic Acid, Venous: 5.8 mmol/L (ref 0.5–1.9)

## 2023-04-26 LAB — CORTISOL-AM, BLOOD: Cortisol - AM: 83.7 ug/dL — ABNORMAL HIGH (ref 6.7–22.6)

## 2023-04-26 LAB — PROCALCITONIN: Procalcitonin: 21.9 ng/mL

## 2023-04-26 LAB — TSH: TSH: 2.057 u[IU]/mL (ref 0.350–4.500)

## 2023-04-26 LAB — HEMOGLOBIN A1C
Hgb A1c MFr Bld: 8 % — ABNORMAL HIGH (ref 4.8–5.6)
Mean Plasma Glucose: 183 mg/dL

## 2023-04-26 LAB — PROTIME-INR
INR: 2.6 — ABNORMAL HIGH (ref 0.8–1.2)
Prothrombin Time: 27.8 s — ABNORMAL HIGH (ref 11.4–15.2)

## 2023-04-26 MED ORDER — VASOPRESSIN 20 UNITS/100 ML INFUSION FOR SHOCK
0.0000 [IU]/min | INTRAVENOUS | Status: DC
  Administered 2023-04-26 – 2023-04-27 (×2): 0.03 [IU]/min via INTRAVENOUS
  Filled 2023-04-26 (×2): qty 100

## 2023-04-26 MED ORDER — IPRATROPIUM-ALBUTEROL 0.5-2.5 (3) MG/3ML IN SOLN
3.0000 mL | Freq: Once | RESPIRATORY_TRACT | Status: AC
  Administered 2023-04-26: 3 mL via RESPIRATORY_TRACT

## 2023-04-26 MED ORDER — MIDODRINE HCL 5 MG PO TABS: 10.0000 mg | ORAL_TABLET | Freq: Three times a day (TID) | ORAL | Status: DC

## 2023-04-26 MED ORDER — NOREPINEPHRINE 16 MG/250ML-% IV SOLN
0.0000 ug/min | INTRAVENOUS | Status: DC
  Administered 2023-04-26: 2 ug/min via INTRAVENOUS
  Administered 2023-04-27: 16 ug/min via INTRAVENOUS
  Filled 2023-04-26 (×2): qty 250

## 2023-04-26 MED ORDER — SODIUM CHLORIDE 0.9 % IV SOLN
1.0000 g | Freq: Two times a day (BID) | INTRAVENOUS | Status: AC
Start: 2023-04-26 — End: 2023-05-05
  Administered 2023-04-26 – 2023-05-05 (×20): 1 g via INTRAVENOUS
  Filled 2023-04-26 (×21): qty 20

## 2023-04-26 MED ORDER — INSULIN ASPART 100 UNIT/ML IJ SOLN
0.0000 [IU] | INTRAMUSCULAR | Status: DC
  Administered 2023-04-26 – 2023-04-27 (×4): 3 [IU] via SUBCUTANEOUS
  Administered 2023-04-27: 2 [IU] via SUBCUTANEOUS
  Administered 2023-04-27 (×3): 3 [IU] via SUBCUTANEOUS
  Administered 2023-04-28: 2 [IU] via SUBCUTANEOUS
  Administered 2023-04-28 (×2): 3 [IU] via SUBCUTANEOUS
  Administered 2023-04-28: 5 [IU] via SUBCUTANEOUS
  Administered 2023-04-28 – 2023-04-29 (×4): 3 [IU] via SUBCUTANEOUS
  Administered 2023-04-29: 5 [IU] via SUBCUTANEOUS
  Administered 2023-04-29: 2 [IU] via SUBCUTANEOUS
  Administered 2023-04-29: 8 [IU] via SUBCUTANEOUS
  Administered 2023-04-30 – 2023-05-01 (×4): 3 [IU] via SUBCUTANEOUS
  Administered 2023-05-01: 2 [IU] via SUBCUTANEOUS
  Administered 2023-05-01: 11 [IU] via SUBCUTANEOUS
  Administered 2023-05-01: 5 [IU] via SUBCUTANEOUS
  Administered 2023-05-01 – 2023-05-02 (×2): 2 [IU] via SUBCUTANEOUS
  Administered 2023-05-02: 8 [IU] via SUBCUTANEOUS
  Administered 2023-05-02: 5 [IU] via SUBCUTANEOUS
  Administered 2023-05-02: 11 [IU] via SUBCUTANEOUS
  Administered 2023-05-02: 2 [IU] via SUBCUTANEOUS
  Administered 2023-05-02 – 2023-05-03 (×2): 5 [IU] via SUBCUTANEOUS
  Administered 2023-05-03 (×2): 2 [IU] via SUBCUTANEOUS
  Administered 2023-05-04: 5 [IU] via SUBCUTANEOUS
  Administered 2023-05-04 (×2): 2 [IU] via SUBCUTANEOUS
  Administered 2023-05-04: 5 [IU] via SUBCUTANEOUS
  Administered 2023-05-05: 8 [IU] via SUBCUTANEOUS
  Administered 2023-05-05: 2 [IU] via SUBCUTANEOUS
  Filled 2023-04-26 (×42): qty 1

## 2023-04-26 MED ORDER — SODIUM CHLORIDE 0.9 % IV SOLN
250.0000 mL | INTRAVENOUS | Status: AC
  Administered 2023-04-26: 250 mL via INTRAVENOUS

## 2023-04-26 MED ORDER — METHYLPREDNISOLONE SODIUM SUCC 125 MG IJ SOLR
60.0000 mg | Freq: Two times a day (BID) | INTRAMUSCULAR | Status: DC
  Administered 2023-04-26: 60 mg via INTRAVENOUS
  Filled 2023-04-26: qty 0.96
  Filled 2023-04-26: qty 2

## 2023-04-26 MED ORDER — LACTATED RINGERS IV BOLUS
1000.0000 mL | Freq: Once | INTRAVENOUS | Status: AC
  Administered 2023-04-26: 1000 mL via INTRAVENOUS

## 2023-04-26 MED ORDER — HYDROCORTISONE SOD SUC (PF) 100 MG IJ SOLR
100.0000 mg | Freq: Two times a day (BID) | INTRAMUSCULAR | Status: DC
  Administered 2023-04-26 – 2023-04-27 (×2): 100 mg via INTRAVENOUS
  Filled 2023-04-26 (×2): qty 2

## 2023-04-26 MED ORDER — CHLORHEXIDINE GLUCONATE CLOTH 2 % EX PADS
6.0000 | MEDICATED_PAD | Freq: Every day | CUTANEOUS | Status: DC
  Administered 2023-04-26 – 2023-05-07 (×12): 6 via TOPICAL

## 2023-04-26 MED ORDER — IPRATROPIUM-ALBUTEROL 0.5-2.5 (3) MG/3ML IN SOLN: 3.0000 mL | RESPIRATORY_TRACT | Status: DC

## 2023-04-26 MED ORDER — FAMOTIDINE IN NACL 20-0.9 MG/50ML-% IV SOLN
20.0000 mg | Freq: Two times a day (BID) | INTRAVENOUS | Status: DC
Start: 2023-04-26 — End: 2023-04-26
  Administered 2023-04-26 (×2): 20 mg via INTRAVENOUS
  Filled 2023-04-26 (×2): qty 50

## 2023-04-26 MED ORDER — METHYLPREDNISOLONE SODIUM SUCC 125 MG IJ SOLR
INTRAMUSCULAR | Status: AC
  Filled 2023-04-26: qty 2

## 2023-04-26 MED ORDER — STERILE WATER FOR INJECTION IV SOLN
INTRAVENOUS | Status: DC
  Filled 2023-04-26: qty 1000
  Filled 2023-04-26: qty 150
  Filled 2023-04-26 (×2): qty 1000

## 2023-04-26 MED ORDER — METHYLPREDNISOLONE SODIUM SUCC 125 MG IJ SOLR: 60.0000 mg | Freq: Two times a day (BID) | INTRAMUSCULAR | Status: DC

## 2023-04-26 MED ORDER — DIPHENHYDRAMINE HCL 50 MG/ML IJ SOLN
INTRAMUSCULAR | Status: AC
  Filled 2023-04-26: qty 1

## 2023-04-26 MED ORDER — SODIUM CHLORIDE 0.9 % IV BOLUS
500.0000 mL | Freq: Once | INTRAVENOUS | Status: AC
  Administered 2023-04-26: 500 mL via INTRAVENOUS

## 2023-04-26 MED ORDER — MUPIROCIN 2 % EX OINT
TOPICAL_OINTMENT | Freq: Two times a day (BID) | CUTANEOUS | Status: DC
  Administered 2023-04-27: 1 via NASAL
  Filled 2023-04-26 (×2): qty 22

## 2023-04-26 MED ORDER — DIPHENHYDRAMINE HCL 50 MG/ML IJ SOLN
25.0000 mg | Freq: Four times a day (QID) | INTRAMUSCULAR | Status: DC | PRN
  Administered 2023-04-26: 25 mg via INTRAVENOUS

## 2023-04-26 MED ORDER — METHYLPREDNISOLONE SODIUM SUCC 125 MG IJ SOLR
60.0000 mg | Freq: Two times a day (BID) | INTRAMUSCULAR | Status: DC
Start: 2023-04-26 — End: 2023-04-26
  Administered 2023-04-26: 60 mg via INTRAVENOUS

## 2023-04-26 MED ORDER — INSULIN ASPART 100 UNIT/ML IJ SOLN: 0.0000 [IU] | Freq: Every day | INTRAMUSCULAR | Status: DC

## 2023-04-26 MED ORDER — NOREPINEPHRINE 4 MG/250ML-% IV SOLN
2.0000 ug/min | INTRAVENOUS | Status: DC
  Administered 2023-04-26: 2 ug/min via INTRAVENOUS
  Filled 2023-04-26 (×2): qty 250

## 2023-04-26 MED ORDER — INSULIN ASPART 100 UNIT/ML IJ SOLN
0.0000 [IU] | Freq: Three times a day (TID) | INTRAMUSCULAR | Status: DC
Start: 2023-04-26 — End: 2023-04-26
  Administered 2023-04-26: 2 [IU] via SUBCUTANEOUS
  Filled 2023-04-26: qty 1

## 2023-04-26 NOTE — Progress Notes (Signed)
 Patient transferred to ICU in bed in stable condition due to fluctuating vitals and level of consciousness with Lactic Acid level of 6.3.

## 2023-04-26 NOTE — Assessment & Plan Note (Addendum)
-   d/c pepcid for now - allergic to protonix

## 2023-04-26 NOTE — Assessment & Plan Note (Signed)
-

## 2023-04-26 NOTE — Assessment & Plan Note (Signed)
-   3/4 bottles from admission growing ESBL E. coli.  Presumed from urinary source -Urine culture with multiple species.  No need to recollect at this time - Continue meropenem ; may need prolonged hospitalization to complete course for bacteremia and complicated UTI

## 2023-04-26 NOTE — Procedures (Signed)
 Arterial Catheter Insertion Procedure Note  KREE ARMATO  [id number]  [birth year]  Date:04/26/23  Time:3:22 PM    Provider Performing: Inge JONETTA Lecher    Procedure: Insertion of Arterial Line (63379) with US  guidance (23062)   Indication(s) Blood pressure monitoring and/or need for frequent ABGs  Consent Risks of the procedure as well as the alternatives and risks of each were explained to the patient and/or caregiver.  Consent for the procedure was obtained and is signed in the bedside chart  Anesthesia None   Time Out Verified patient identification, verified procedure, site/side was marked, verified correct patient position, special equipment/implants available, medications/allergies/relevant history reviewed, required imaging and test results available.   Sterile Technique Maximal sterile technique including full sterile barrier drape, hand hygiene, sterile gown, sterile gloves, mask, hair covering, sterile ultrasound probe cover (if used).   Procedure Description Area of catheter insertion was cleaned with chlorhexidine  and draped in sterile fashion. With real-time ultrasound guidance an arterial catheter was placed into the left radial artery.  Appropriate arterial tracings confirmed on monitor.     Complications/Tolerance None; patient tolerated the procedure well.   EBL Minimal   Specimen(s) None    Inge Lecher, AGACNP-BC [facility] Prefer epic messenger for cross cover needs If after hours, please call E-link

## 2023-04-26 NOTE — Assessment & Plan Note (Addendum)
-   q4h SSI and CBG until mentation improves

## 2023-04-26 NOTE — Assessment & Plan Note (Addendum)
-   Patient presented with dysuria and AMS; CT renal protocol noted with "Obstructive 2 mm mid to distal right ureterolithiasis." - s/p right ureteral stent placement with urology on 04/25/2023 - further follow-up with urology outpatient

## 2023-04-26 NOTE — TOC Initial Note (Signed)
 Transition of Care [facility]) - Progression Note    Patient Details  Name: Jodi Reeves MRN: [id number] Date of Birth: [birth year]  [facility] CM/SW Contact  Tawyna Pellot A Zipporah Finamore, RN Phone Number: 04/26/2023, 10:42 AM  Clinical Narrative:    Chart reviewed.  Noted that patient was admitted with Acute unilateral obstructive uropathy.  Pt. Is status post cystoscopy ureteroscopy and stent placement.  Patient on IV meropenem  for treatment for UTI.    Noted that patient is from [facility]  I have spoken with Barnie, Admission Coordinator from [facility]. Barnie informs me that patient is a long term care resident at [facility].  Barnie reports that patient is able to return when stable for discharge.    TOC will continue to follow for discharge planning.     Expected Discharge Plan: Long Term Nursing Home Barriers to Discharge: No Barriers Identified  Expected Discharge Plan and Services     Post Acute Care Choice:  ([facility] Long Term [facility] facility)                                         Social Determinants of Health (SDOH) Interventions SDOH Screenings   Food Insecurity: No Food Insecurity (06/21/2021)  Housing: Low Risk  (06/21/2021)  Transportation Needs: No Transportation Needs (06/21/2021)  Depression (PHQ2-9): Low Risk  (06/21/2021)  Financial Resource Strain: Low Risk  (06/21/2021)  Physical Activity: Insufficiently Active (06/18/2020)  Social Connections: Unknown (06/21/2021)  Stress: No Stress Concern Present (06/21/2021)  Tobacco Use: Low Risk  (04/25/2023)    Readmission Risk Interventions     No data to display

## 2023-04-26 NOTE — Hospital Course (Signed)
 Jodi Reeves is a 89 yo female with PMH anxiety, chronic diastolic CHF, CAD, DM II, GERD, HTN, HLD, hypothyroidism, PAF who presented with altered mentation and dysuria.  She resides at skilled nursing facility and her urine began looking more cloudy and she was referred to the ER. On arrival while she was found to be febrile with mild leukocytosis and elevated lactic acid.  UA concerning for infection as well. CT renal stone protocol was performed showing obstructed distal right ureter with 2 mm stone.  Urology was consulted and she underwent cystoscopy with right ureteral stent placement.  Blood cultures after admission also began growing E. coli ESBL.  She had already been started on meropenem  on admission as well after her allergy profile was taken into consideration. After cystoscopy, she developed worsening refractory hypotension despite fluid resuscitation.  Lactic acid further up trended and she was transferred to the ICU for vasopressor support.

## 2023-04-26 NOTE — Assessment & Plan Note (Addendum)
-   fever, tachycardia, tachypnea, leukocytosis, and lactic acidosis - stabilized initially with IVF and abx but after cysto has further decompensated; likely some further translocation of bacteria - lactic now uptrending; given repeat 1L LR while lactic returned at 6.3 - continue LR maintenance - s/p fluid resuscitation 3.5 L with refractory hypotension - cortisol appropriate  -Transfer to ICU and start Levophed  - discussed with ICU team  -Continue meropenem , see bacteremia

## 2023-04-26 NOTE — Progress Notes (Signed)
 PHARMACY - PHYSICIAN COMMUNICATION CRITICAL VALUE ALERT - BLOOD CULTURE IDENTIFICATION (BCID)  SHALEEN TALAMANTEZ is an 89 y.o. female who presented to [facility] on 04/25/2023 with a chief complaint of dysuria and altered mental status.  Assessment:  3 out of 4 bottles (1 anaerobic, 2 aerobic) growing E coli (CTX-M resistance gene detected)  Name of physician (or Provider) Contacted: Dr. Patsy  Current antibiotics: meropenem  1 g IV every 12 hours  Changes to prescribed antibiotics recommended:  Patient is on recommended antibiotics - No changes needed  Results for orders placed or performed during the hospital encounter of 04/25/23  Blood Culture ID Panel (Reflexed) (Collected: 04/25/2023  6:03 PM)  Result Value Ref Range   Enterococcus faecalis NOT DETECTED NOT DETECTED   Enterococcus Faecium NOT DETECTED NOT DETECTED   Listeria monocytogenes NOT DETECTED NOT DETECTED   Staphylococcus species NOT DETECTED NOT DETECTED   Staphylococcus aureus (BCID) NOT DETECTED NOT DETECTED   Staphylococcus epidermidis NOT DETECTED NOT DETECTED   Staphylococcus lugdunensis NOT DETECTED NOT DETECTED   Streptococcus species NOT DETECTED NOT DETECTED   Streptococcus agalactiae NOT DETECTED NOT DETECTED   Streptococcus pneumoniae NOT DETECTED NOT DETECTED   Streptococcus pyogenes NOT DETECTED NOT DETECTED   A.calcoaceticus-baumannii NOT DETECTED NOT DETECTED   Bacteroides fragilis NOT DETECTED NOT DETECTED   Enterobacterales DETECTED (A) NOT DETECTED   Enterobacter cloacae complex NOT DETECTED NOT DETECTED   Escherichia coli DETECTED (A) NOT DETECTED   Klebsiella aerogenes NOT DETECTED NOT DETECTED   Klebsiella oxytoca NOT DETECTED NOT DETECTED   Klebsiella pneumoniae NOT DETECTED NOT DETECTED   Proteus species NOT DETECTED NOT DETECTED   Salmonella species NOT DETECTED NOT DETECTED   Serratia marcescens NOT DETECTED NOT DETECTED   Haemophilus influenzae NOT DETECTED NOT DETECTED   Neisseria  meningitidis NOT DETECTED NOT DETECTED   Pseudomonas aeruginosa NOT DETECTED NOT DETECTED   Stenotrophomonas maltophilia NOT DETECTED NOT DETECTED   Candida albicans NOT DETECTED NOT DETECTED   Candida auris NOT DETECTED NOT DETECTED   Candida glabrata NOT DETECTED NOT DETECTED   Candida krusei NOT DETECTED NOT DETECTED   Candida parapsilosis NOT DETECTED NOT DETECTED   Candida tropicalis NOT DETECTED NOT DETECTED   Cryptococcus neoformans/gattii NOT DETECTED NOT DETECTED   CTX-M ESBL DETECTED (A) NOT DETECTED   Carbapenem resistance IMP NOT DETECTED NOT DETECTED   Carbapenem resistance KPC NOT DETECTED NOT DETECTED   Carbapenem resistance NDM NOT DETECTED NOT DETECTED   Carbapenem resist OXA 48 LIKE NOT DETECTED NOT DETECTED   Carbapenem resistance VIM NOT DETECTED NOT DETECTED   Thank you for involving pharmacy in this patient's care.   Damien Napoleon, PharmD Clinical Pharmacist 04/26/2023 8:07 AM

## 2023-04-26 NOTE — Assessment & Plan Note (Signed)
-   Last echo July 2020: EF 55 to 60%, impaired relaxation - Caution with fluids during resuscitation; monitor for overload

## 2023-04-26 NOTE — Procedures (Signed)
 Central Venous Catheter Insertion Procedure Note  BRENDA SAMANO  [id number]  04-28-1927  Date:04/26/23  Time:3:00 PM   Provider Performing:Iva Posten D Shellia   Procedure: Insertion of Non-tunneled Central Venous [phone]) with US  guidance ([id number])   Indication(s) Medication administration and Difficult access  Consent Risks of the procedure as well as the alternatives and risks of each were explained to the patient and/or caregiver.  Consent for the procedure was obtained and is signed in the bedside chart  Anesthesia Topical only with 1% lidocaine    Timeout Verified patient identification, verified procedure, site/side was marked, verified correct patient position, special equipment/implants available, medications/allergies/relevant history reviewed, required imaging and test results available.  Sterile Technique Maximal sterile technique including full sterile barrier drape, hand hygiene, sterile gown, sterile gloves, mask, hair covering, sterile ultrasound probe cover (if used).  Procedure Description Area of catheter insertion was cleaned with chlorhexidine  and draped in sterile fashion.  With real-time ultrasound guidance a central venous catheter was placed into the left femoral vein. Nonpulsatile blood flow and easy flushing noted in all ports.  The catheter was sutured in place and sterile dressing applied.  Complications/Tolerance None; patient tolerated the procedure well. Chest X-ray is ordered to verify placement for internal jugular or subclavian cannulation.   Chest x-ray is not ordered for femoral cannulation.  EBL Minimal  Specimen(s) None    Line inserted to the 20 cm mark. BIOPATCH applied to the insertion site.   Inge Shellia, AGACNP-BC [facility] Prefer epic messenger for cross cover needs If after hours, please call E-link

## 2023-04-26 NOTE — Anesthesia Postprocedure Evaluation (Signed)
 Anesthesia Post Note  Patient: Jodi Reeves  Procedure(s) Performed: CYSTOSCOPY WITH RETROGRADE PYELOGRAM, URETEROSCOPY AND STENT PLACEMENT (Right)  Patient location during evaluation: PACU Anesthesia Type: General Level of consciousness: awake and alert Pain management: pain level controlled Vital Signs Assessment: post-procedure vital signs reviewed and stable Respiratory status: spontaneous breathing, nonlabored ventilation, respiratory function stable and patient connected to nasal cannula oxygen Cardiovascular status: blood pressure returned to baseline and stable Postop Assessment: no apparent nausea or vomiting Anesthetic complications: no   No notable events documented.   Last Vitals:  Vitals:   04/26/23 0015 04/26/23 0048  BP: 117/66 (!) 103/51  Pulse: 100 92  Resp: (!) 22 20  Temp: 36.9 C 36.9 C  SpO2: 100% 90%    Last Pain:  Vitals:   04/26/23 0048  TempSrc: Oral  PainSc: 0-No pain                 Prentice Murphy

## 2023-04-26 NOTE — Progress Notes (Signed)
 Pt came out of OR at 23:46. Once she aroused about 5 mins. pt was saying she felt short of breath. Pt was given a breathing treatment and admitting at bedside. MD thought pt could possibly be having an allergic reaction to the Ancef  that was given in the OR.Pt then was given Benadryl . Solumedrol and IV Pepcid  for reaction. Pt is resting comfortably at this time.12.24

## 2023-04-26 NOTE — Plan of Care (Signed)
  Problem: Clinical Measurements: Goal: Diagnostic test results will improve Outcome: Progressing   Problem: Respiratory: Goal: Ability to maintain adequate ventilation will improve Outcome: Progressing   Problem: Safety: Goal: Ability to remain free from injury will improve Outcome: Progressing   Problem: Skin Integrity: Goal: Risk for impaired skin integrity will decrease Outcome: Progressing

## 2023-04-26 NOTE — Plan of Care (Signed)
  Problem: Fluid Volume: Goal: Hemodynamic stability will improve Outcome: Progressing   Problem: Clinical Measurements: Goal: Diagnostic test results will improve Outcome: Progressing   Problem: Clinical Measurements: Goal: Ability to maintain clinical measurements within normal limits will improve Outcome: Progressing   Problem: Nutrition: Goal: Adequate nutrition will be maintained Outcome: Progressing

## 2023-04-26 NOTE — Progress Notes (Signed)
 Pharmacy Antibiotic Note  Jodi Reeves is a 89 y.o. female admitted on 04/25/2023 with UTI.  Pharmacy has been consulted for meropenem  dosing.  Plan: Meropenem  1 gm IV X 1 given on 1/01 @ 1952. Meropenem  1 gm IV Q12H ordered to start on 1/02 @ 0800.   Height: 5' 4 (162.6 cm) Weight: 78.1 kg (172 lb 2.9 oz) IBW/kg (Calculated) : 54.7  Temp (24hrs), Avg:99.1 F (37.3 C), Min:97.9 F (36.6 C), Max:101.5 F (38.6 C)  Recent Labs  Lab 04/25/23 1803 04/25/23 1952  WBC 12.2*  --   CREATININE 1.26*  --   LATICACIDVEN 2.4* 1.1    Estimated Creatinine Clearance: 28.2 mL/min (A) (by C-G formula based on SCr of 1.26 mg/dL (H)).    Allergies  Allergen Reactions   Amiodarone  Other (See Comments)    Severe Thyroid  issues    Fosamax [Alendronate]     dysphagia   Penicillins Anaphylaxis and Other (See Comments)    TOLERATED CEFAZOLIN  07/06/20 Has patient had a PCN reaction causing immediate rash, facial/tongue/throat swelling, SOB or lightheadedness with hypotension: Yes Has patient had a PCN reaction causing severe rash involving mucus membranes or skin necrosis: No Has patient had a PCN reaction that required hospitalization: No Has patient had a PCN reaction occurring within the last 10 years: No If all of the above answers are NO, then may proceed with Cephalosporin use.    Sulfa Antibiotics Itching and Rash    Itching    Actos [Pioglitazone]     Not effective    Amaryl [Glimepiride]     Not effective     Atorvastatin Other (See Comments)    Muscle aches & All statins per Patient   Bentyl  [Dicyclomine  Hcl]     Could not tolerate nausea, reduced concentration, h/a    Ciprofloxacin  Other (See Comments)    High blood pressure     Codeine Nausea And Vomiting   Ezetimibe-Simvastatin Other (See Comments)    Muscle aches, nausea, back pain    Lovastatin Other (See Comments)    Myalgias   Macrobid  [Nitrofurantoin  Macrocrystal]     Severe Itching, rash    Protonix   [Pantoprazole  Sodium]     Esophageal problems  Wants removed from list   Rosiglitazone Maleate Other (See Comments)    Edema   Statins     Muscle and joint aches to all statins   Victoza [Liraglutide]     Nausea    Alendronate Sodium Rash    Dysphagia and ulceration    Bactrim [Sulfamethoxazole-Trimethoprim] Rash    itching    Antimicrobials this admission:   >>    >>   Dose adjustments this admission:   Microbiology results:  BCx:   UCx:    Sputum:    MRSA PCR:   Thank you for allowing pharmacy to be a part of this patient's care.  Raylene Carmickle D 04/26/2023 3:40 AM

## 2023-04-26 NOTE — Consult Note (Signed)
 NAME:  Jodi Reeves, MRN:  [id number], DOB:  02-27-1928, LOS: 1 ADMISSION DATE:  04/25/2023, CONSULTATION DATE:  04/26/2023 REFERRING MD:  Dr. Patsy, CHIEF COMPLAINT:  Septic shock   Brief Pt Description / Synopsis:  89 y.o. female admitted with Acute Metabolic Encephalopathy in setting of Severe Sepsis due to  ESBL E. Coli BACTEREMIA due to Right Ureteral Stone infection, status post Cystoscopy and right ureteral stent placement.  Course complicated by development of septic shock.  History of Present Illness:  Jodi Reeves is a 89 y.o. female with a past medical history significant for anxiety, coronary artery disease, diastolic CHF, GERD, hypertension, dyslipidemia, hypothyroidism, TIA and IBS, who presented to the ER on 04/25/23 with acute onset of dysuria with altered mental status.  Pt is currently altered and unable to contribute to history and no family currently available, therefore history is obtained from chart review.   Per H&P by Hospitalist, over the past few days she has not been acting her normal self with more fatigue and more confusion.  Her urine looked cloudy when they collected at her [facility].  They thought she had a UTI but could not get her antibiotic today.  They therefore sent her to the emergency room for concern about sepsis.  She did not have reported nausea or vomiting or abdominal pain.  No reported chest pain or palpitations, cough or wheezing or hemoptysis. The patient was a fairly poor historian as she was seen post procedure and was very somnolent.   ED Course: Initial Vital Signs: , temperature was 101.5 with otherwise normal vitals. Respiratory rate later on was 24 and heart rate 99 then 110.  Significant Labs: mild hyponatremia hypochloremia blood glucose 143, creatinine 1.26 and AST 43 with albumin 3.1 and total protein 7.7. Lactic acid was 2.4 and later 1.1. CBC showed leukocytosis 12.2 with neutrophilia. Respiratory panel came back negative. UA was positive for  UTI. Blood culture was sent.  Imaging Chest X-ray>>linear consolidation within the left lung base favoring scarring or atelectasis or pneumonia.  Medications Administered: given IV meropenem  given her history of anaphylaxis to penicillin but in the OR she was given IV Ancef . She was also given 1 g of p.o. Tylenol  and 1 L bolus of IV normal saline in the ER   She was admitted by the Hospitalists for further workup and treatment.  Urology was consulted and performed urgent Right ureteral stent placement.  Please see Significant Hospital Events section below for full detailed hospital course.    Pertinent  Medical History   Past Medical History:  Diagnosis Date   Allergy    Anxiety    Balance disorder    Chronic diastolic CHF (congestive heart failure) (HCC)    a. 10/2018 Echo: EF 55-60%, diast dysfxn, nl RV fxn. RVSP 45.36mmHg. Mild-mod TR. Mod dil LA.   Colon polyps    Coronary artery disease, non-occlusive    a. LHC 11/2003: 10% LAD stenosis, 20% LCx stenosis, and 40% RCA stenosis; b. nuc stress test 12/15: small region of mild ischemia in the apical region with WMA also noted in the apical region, EF 60%. 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 CHADS2VASc = > 8 (CHF, HTN, age x 2, DM, TIA x 2, female); b. on Xarelto    SBO (small bowel obstruction) (HCC)    1998   TIA (transient  ischemic attack)    UTI (urinary tract infection)    Venous insufficiency    Ventral hernia     Micro Data:  1/1: SARS-CoV-2/RSV /Flu PCR>>negative 1/1: Blood cultures>> ESBL E. Coli 1/1: Urine>>  Antimicrobials:   Anti-infectives (From admission, onward)    Start     Dose/Rate Route Frequency Ordered Stop   04/26/23 0800  meropenem  (MERREM ) 1 g in sodium chloride  0.9 % 100 mL IVPB        1 g 200 mL/hr over 30 Minutes Intravenous Every 12 hours 04/26/23 0339     04/25/23 2300  meropenem  (MERREM ) 1 g in sodium chloride  0.9 % 100 mL IVPB  Status:  Discontinued        1 g 200 mL/hr over 30 Minutes Intravenous  Once 04/25/23 2250 04/26/23 0340   04/25/23 1900  meropenem  (MERREM ) 1 g in sodium chloride  0.9 % 100 mL IVPB        1 g 200 mL/hr over 30 Minutes Intravenous  Once 04/25/23 1854 04/25/23 2134       Significant Hospital Events: Including procedures, antibiotic start and stop dates in addition to other pertinent events   1/1: Presented to ED with AMS, admitted by Hospitalist for sepsis due to infect right ureteral stone.  Urology consulted, performed Right ureteral stent. 1/2: Became hypotensive concerning for developing septic shock.  Transferred to ICU with PCCM consultation for potential vasopressors.  Interim History / Subjective:  -Seen upon arrival to ICU ~ SBP in upper 90's to low 100's (MAP >65) ~ later hypotensive, has only received 1 L fluids, currently clear on auscultation and on room air ~ will give another 500 cc NS and assess for fluid responsiveness and need for additional fluids -Pt is lethargic, withdraws from pain and moans,  will check ABG  Objective   Blood pressure (!) 75/49, pulse 98, temperature 98 F (36.7 C), resp. rate 16, height 5' 4 (1.626 m), weight 78.1 kg, SpO2 90%.        Intake/Output Summary (Last 24 hours) at 04/26/2023 1135 Last data filed at 04/26/2023 1119 Gross per 24 hour  Intake 1150 ml  Output 275 ml  Net 875 ml   Filed  Weights   04/26/23 0112  Weight: 78.1 kg    Examination: General: Acute on chronically ill-appearing elderly female, laying in bed, on room air, no acute distress HENT: Atraumatic, normocephalic, neck supple, no JVD Lungs: Clear breath sounds throughout, even, nonlabored, normal effort Cardiovascular: Irregularly irregular rhythm, rate controlled, no murmurs, rubs, gallops Abdomen: Obese, soft, nontender, no guarding or rebound tenderness, bowel sounds positive x 4 Extremities: Generalized weakness, normal bulk and tone, no deformities, trace edema to bilateral lower extremities Neuro: Lethargic, arouses to pain, currently not following commands, pupils PERRLA GU: Foley catheter in place Skin: No obvious rashes, lesions, ulcerations.  No pressure ulcer noted  upon arrival to ICU  Resolved Hospital Problem list     Assessment & Plan:   #Septic Shock #Paroxymal Atrial Fibrillation -Continuous cardiac monitoring -Maintain MAP >65 -IV fluids -Vasopressors as needed to maintain MAP goal -Trend lactic acid until normalized -Trend HS Troponin until peaked -Echocardiogram pending -May need to consider starting for Heparin gtt for anticoagulation ~ but will defer to primary service and Urology on timing as pt had ureteral stent placed yesterfday  #Severe Sepsis  #ESBL E. Coli BACTEREMIA  #Right Ureteral Stone infection ~ status post Right Ureteral Stent on 1/1 -Monitor fever curve -Trend WBC's & Procalcitonin -Follow cultures as above -Continue empiric Meropenem  pending cultures & sensitivities -Urology following, appreciate input  #Acute Kidney Injury #AG Metabolic Acidosis #Mild Hypokalemia #Mild Hyponatremia -Monitor I&O's / urinary output -Follow BMP -Ensure adequate renal perfusion -Avoid nephrotoxic agents as able -Replace electrolytes as indicated ~ Pharmacy following for assistance with electrolyte replacement -IV fluids  #Thrombocytopenia -Monitor for S/Sx of  bleeding -Trend CBC -Lovenox  for VTE Prophylaxis  -Transfuse for Hgb <7  #Diabetes Mellitus Type II #Hypothyroidism -CBG's q4h; Target range of 140 to 180 -SSI -Follow ICU Hypo/Hyperglycemia protocol -Continue Synthroid   #Acute Metabolic Encephalopathy -Treatment of sepsis and metabolic derangements as outlined above -Provide supportive care -Promote normal sleep/wake cycle and family presence -Avoid sedating medications as able -Check ABG     Patient is critically ill with severe sepsis with developing septic shock and multiorgan failure.  Given current critical illness superimposed on multiple chronic comorbidities and advanced age, prognosis is extremely guarded.  Patient is DNR/DNI.  May need to consult palliative care depending on clinical course.   Best Practice (right click and Reselect all SmartList Selections daily)   Diet/type: NPO DVT prophylaxis: LMWH GI prophylaxis: N/A Lines: N/A Foley:  Yes, and it is still needed Code Status:  DNR Last date of multidisciplinary goals of care discussion [N/A]  1/2: Will update patient's family when they arrive at bedside.  Labs   CBC: Recent Labs  Lab 04/25/23 1803 04/26/23 0506  WBC 12.2* 9.4  NEUTROABS 10.0*  --   HGB 12.7 11.1*  HCT 37.2 32.6*  MCV 98.9 101.2*  PLT 164 97*    Basic Metabolic Panel: Recent Labs  Lab 04/25/23 1803 04/26/23 0506  NA 132* 133*  K 3.7 3.3*  CL 95* 99  CO2 28 19*  GLUCOSE 143* 131*  BUN 22 25*  CREATININE 1.26* 1.35*  CALCIUM  8.9 7.5*   GFR: Estimated Creatinine Clearance: 26.3 mL/min (A) (by C-G formula based on SCr of 1.35 mg/dL (H)). Recent Labs  Lab 04/25/23 1803 04/25/23 1952 04/26/23 0506 04/26/23 1024  WBC 12.2*  --  9.4  --   LATICACIDVEN 2.4* 1.1  --  6.3*    Liver Function Tests: Recent Labs  Lab 04/25/23 1803  AST 43*  ALT 15  ALKPHOS 99  BILITOT 1.0  PROT 7.7  ALBUMIN 3.1*   Recent Labs  Lab 04/25/23 1803  LIPASE 28   No results for  input(s): AMMONIA in the last 168 hours.  ABG No results found for: PHART, PCO2ART, PO2ART, HCO3, TCO2, ACIDBASEDEF, O2SAT   Coagulation Profile: Recent Labs  Lab 04/26/23 0506  INR 2.6*    Cardiac Enzymes: No results for input(s): CKTOTAL, CKMB, CKMBINDEX, TROPONINI in the last 168 hours.  HbA1C: Hgb A1c MFr Bld  Date/Time Value Ref Range Status  10/10/2020 08:10 AM 7.8 (H) 4.8 - 5.6 % Final    Comment:    (  NOTE)         Prediabetes: 5.7 - 6.4         Diabetes: >6.4         Glycemic control for adults with diabetes: <7.0   09/26/2019 10:11 AM 6.9 (H) 4.8 - 5.6 % Final    Comment:             Prediabetes: 5.7 - 6.4          Diabetes: >6.4          Glycemic control for adults with diabetes: <7.0     CBG: Recent Labs  Lab 04/25/23 2339 04/26/23 0843  GLUCAP 115* 155*    Review of Systems:   Unable to assess due to AMS   Past Medical History:  She,  has a past medical history of Allergy, Anxiety, Balance disorder, Chronic diastolic CHF (congestive heart failure) (HCC), Colon polyps, Coronary artery disease, non-occlusive, DM2 (diabetes mellitus, type 2) (HCC), Fall, Falls frequently, GERD (gastroesophageal reflux disease), Granuloma annulare, History of chicken pox, History of eating disorder, HLD (hyperlipidemia), HTN (hypertension), Hypothyroidism, IBS (irritable bowel syndrome), Incontinence of bowel, Osteopenia, Osteoporosis, Ovarian cancer (HCC), Persistent atrial fibrillation (HCC), SBO (small bowel obstruction) (HCC), TIA (transient ischemic attack), UTI (urinary tract infection), Venous insufficiency, and Ventral hernia.   Surgical History:   Past Surgical History:  Procedure Laterality Date   2nd look laparotomy  1982   ABDOMINAL HYSTERECTOMY     for ovarian cancer s/p hysterectomy total 1976 and exp lap 1980   APPENDECTOMY     BACK SURGERY     CARDIAC CATHETERIZATION  8/05   neg; a fib found    CARPAL TUNNEL RELEASE Left  03/06/2018   Procedure: CARPAL TUNNEL RELEASE ENDOSCOPIC;  Surgeon: Edie Norleen PARAS, MD;  Location: [facility];  Service: Orthopedics;  Laterality: Left;  diabetic - oral meds   CARPAL TUNNEL RELEASE     left CTS Dr.Poggi   cataract OD  2002   cataract surgery  5/07   R   CHOLECYSTECTOMY  1986   CYSTOSCOPY WITH RETROGRADE PYELOGRAM, URETEROSCOPY AND STENT PLACEMENT Right 04/25/2023   Procedure: CYSTOSCOPY WITH RETROGRADE PYELOGRAM, URETEROSCOPY AND STENT PLACEMENT;  Surgeon: Selma Donnice SAUNDERS, MD;  Location: [facility];  Service: Urology;  Laterality: Right;   DEXA  9/04 and 1/02   EGD/dilation/colon  1/06   ESOPHAGOGASTRODUODENOSCOPY (EGD) WITH PROPOFOL  N/A 05/16/2016   Procedure: ESOPHAGOGASTRODUODENOSCOPY (EGD) WITH PROPOFOL  with dilation;  Surgeon: Rogelia Copping, MD;  Location: [facility];  Service: Endoscopy;  Laterality: N/A;   FEMORAL HERNIA REPAIR  1958   R   fracture L elbow/wrist  2000   INTRAMEDULLARY (IM) NAIL INTERTROCHANTERIC Left 10/12/2020   Procedure: INTRAMEDULLARY (IM) NAIL INTERTROCHANTRIC;  Surgeon: Krasinski, Kevin, MD;  Location: [facility];  Service: Orthopedics;  Laterality: Left;   intussception/obstruction  12/98   JOINT REPLACEMENT     left shoulder   kidney stone x2  1991   KYPHOPLASTY N/A 04/13/2020   Procedure: [id number];  Surgeon: Kathlynn Sharper, MD;  Location: [facility];  Service: Orthopedics;  Laterality: N/A;   KYPHOPLASTY N/A 05/18/2020   Procedure: L1 KYPHOPLASTY;  Surgeon: Kathlynn Sharper, MD;  Location: [facility];  Service: Orthopedics;  Laterality: N/A;   KYPHOPLASTY N/A 07/06/2020   Procedure: L4  KYPHOPLASTY;  Surgeon: Kathlynn Sharper, MD;  Location: [facility];  Service: Orthopedics;  Laterality: N/A;   laminectomy L4-5  1971   LUMBAR LAMINECTOMY     1970 ruptured disc  MOUTH SURGERY     myoview  stress  8/05   syncope (-)   REVERSE SHOULDER ARTHROPLASTY Left 10/06/2014   Procedure: REVERSE SHOULDER ARTHROPLASTY;  Surgeon: Norleen JINNY Maltos, MD;   Location: [facility];  Service: Orthopedics;  Laterality: Left;   stillbirth  1969   TOTAL ABDOMINAL HYSTERECTOMY W/ BILATERAL SALPINGOOPHORECTOMY  1980   ovarian cancer   WRIST FRACTURE SURGERY  11/05   R     Social History:   reports that she has never smoked. She has never used smokeless tobacco. She reports that she does not drink alcohol  and does not use drugs.   Family History:  Her family history includes Arthritis in her brother, brother, and sister; COPD in her brother; Cancer in her brother, brother, and brother; Depression in her brother and sister; Diabetes in her brother, mother, and paternal grandfather; Early death in her brother, brother, and father; Hearing loss in her brother, brother, brother, daughter, sister, and sister; Heart disease in her brother, mother, sister, sister, and sister; Hyperlipidemia in her brother; Hypertension in her daughter and sister; Stroke in her sister.   Allergies Allergies  Allergen Reactions   Amiodarone  Other (See Comments)    Severe Thyroid  issues    Fosamax [Alendronate]     dysphagia   Penicillins Anaphylaxis and Other (See Comments)    TOLERATED CEFAZOLIN  07/06/20 Has patient had a PCN reaction causing immediate rash, facial/tongue/throat swelling, SOB or lightheadedness with hypotension: Yes Has patient had a PCN reaction causing severe rash involving mucus membranes or skin necrosis: No Has patient had a PCN reaction that required hospitalization: No Has patient had a PCN reaction occurring within the last 10 years: No If all of the above answers are NO, then may proceed with Cephalosporin use.    Sulfa Antibiotics Itching and Rash    Itching    Actos [Pioglitazone]     Not effective    Amaryl [Glimepiride]     Not effective     Atorvastatin Other (See Comments)    Muscle aches & All statins per Patient   Bentyl  [Dicyclomine  Hcl]     Could not tolerate nausea, reduced concentration, h/a    Ciprofloxacin  Other (See  Comments)    High blood pressure     Codeine Nausea And Vomiting   Ezetimibe-Simvastatin Other (See Comments)    Muscle aches, nausea, back pain    Lovastatin Other (See Comments)    Myalgias   Macrobid  [Nitrofurantoin  Macrocrystal]     Severe Itching, rash    Protonix  [Pantoprazole  Sodium]     Esophageal problems  Wants removed from list   Rosiglitazone Maleate Other (See Comments)    Edema   Statins     Muscle and joint aches to all statins   Victoza [Liraglutide]     Nausea    Alendronate Sodium Rash    Dysphagia and ulceration    Bactrim [Sulfamethoxazole-Trimethoprim] Rash    itching     Home Medications  Prior to Admission medications   Medication Sig Start Date End Date Taking? Authorizing Provider  acetaminophen  (TYLENOL ) 500 MG tablet Take 1,000 mg by mouth every 6 (six) hours as needed.   Yes [provider]  alum & mag hydroxide-simeth (MAALOX/MYLANTA) 200-200-20 MG/5ML suspension Take 30 mLs by mouth every 4 (four) hours as needed for indigestion. 10/20/20  Yes Fausto Sor A, DO  apixaban  (ELIQUIS ) 5 MG TABS tablet Take 5 mg by mouth 2 (two) times daily.   Yes [provider]  Ascorbic Acid  (VITAMIN C ) 1000 MG tablet Take 2,000 mg by mouth daily.   Yes [provider]  bisoprolol  (ZEBETA ) 5 MG tablet Take 5 mg by mouth in the morning and at bedtime.   Yes [provider]  CALCIUM  ANTACID EXTRA STRENGTH 750 MG chewable tablet Chew by mouth. 08/22/21  Yes [provider]  diclofenac Sodium (VOLTAREN) 1 % GEL Apply 2 g topically in the morning, at noon, and at bedtime.   Yes [provider]  estradiol  (ESTRACE ) 0.1 MG/GM vaginal cream APPLY 0.5MG  (PEA SIZED AMOUNT) JUST INSIDE THE VAGINA WITH FINGER-TIP ON MONDAY, WEDNESDAY AND FRIDAY NIGHTS. 07/21/20  Yes McGowan, Clotilda A, PA-C  famotidine  (PEPCID ) 20 MG tablet Take 20 mg by mouth at bedtime. 12/29/21  Yes [provider]  fluticasone  (FLONASE ) 50 MCG/ACT  nasal spray Place 1 spray into both nostrils daily.   Yes [provider]  folic acid  (FOLVITE ) 1 MG tablet Take 1 tablet (1 mg total) by mouth daily. Patient taking differently: Take 1 mg by mouth every other day. 10/21/20  Yes Fausto Sor A, DO  furosemide  (LASIX ) 20 MG tablet Take 1 tablet (20 mg total) by mouth 2 (two) times daily. 10/05/20  Yes Gollan, Timothy J, MD  hydrOXYzine  (ATARAX ) 25 MG tablet Take 25 mg by mouth every 8 (eight) hours as needed for anxiety. 04/25/23 05/09/23 Yes [provider]  insulin  aspart (NOVOLOG ) 100 UNIT/ML injection Inject 0-15 Units into the skin 4 (four) times daily -  before meals and at bedtime. Patient taking differently: Inject 0-15 Units into the skin 4 (four) times daily. 200-249=2u, 250-299=4u, 300-349=6u, 350-399=8u, 400-449=10u, 450-499=12u, if 500 or greater=call MD 10/20/20  Yes Fausto Sor A, DO  insulin  glargine (LANTUS ) 100 UNIT/ML injection Inject 0.1 mLs (10 Units total) into the skin daily. Patient taking differently: Inject 8 Units into the skin daily. 10/21/20  Yes Fausto Sor A, DO  levothyroxine  (SYNTHROID , LEVOTHROID) 50 MCG tablet Take 50 mcg by mouth daily before breakfast.   Yes [provider]  magnesium  hydroxide (MILK OF MAGNESIA) 400 MG/5ML suspension Take 30 mLs by mouth daily as needed for mild constipation. 10/20/20  Yes Fausto Sor A, DO  Magnesium  Oxide 250 MG TABS Take 250 mg by mouth daily.   Yes [provider]  Multiple Vitamins-Minerals (HEALTHY EYES SUPERVISION 2) CAPS Take 1 capsule by mouth 2 (two) times daily.   Yes [provider]  Multiple Vitamins-Minerals (MULTIVITAMIN GUMMIES ADULT PO) Take 1 each by mouth daily.   Yes [provider]  nystatin powder Apply 1 Application topically 3 (three) times daily as needed.   Yes [provider]  Olopatadine  HCl 0.2 % SOLN Place 1 drop into both eyes daily as needed (Allergies).   Yes [provider]  Omega-3 Fatty Acids (FISH OIL) 1000 MG CAPS Take 1 capsule by mouth 2 (two) times daily.   Yes [provider]  ondansetron  (ZOFRAN ) 4 MG tablet Take 1 tablet (4 mg total) by mouth every 6 (six) hours as needed for nausea. 10/20/20  Yes Fausto Sor A, DO  pyridOXINE  (B-6) 50 MG tablet Take 1 tablet (50 mg total) by mouth daily. 10/21/20  Yes Fausto Sor A, DO  TRADJENTA  5 MG TABS tablet Take 5 mg by mouth daily. 10/05/20  Yes [provider]  vitamin B-12 (CYANOCOBALAMIN ) 1000 MCG tablet Take 1,000 mcg by mouth daily. W/ Folate and B6 sublingual   Yes [provider]  aspirin  325 MG  tablet Take 325 mg by mouth daily. Patient not taking: Reported on 04/25/2023 12/29/21   [provider]  bacitracin 500 UNIT/GM ointment Apply 1 Application topically daily. Patient not taking: Reported on 12/21/2022    [provider]  docusate sodium  (COLACE) 100 MG capsule Take 100 mg by mouth daily as needed for mild constipation. Patient not taking: Reported on 12/21/2022    [provider]  glucose blood (ONE TOUCH ULTRA TEST) test strip Use to test blood sugar once daily E11.49 06/24/15   Bedsole, Amy E, MD  insulin  glargine-yfgn (SEMGLEE ) 100 UNIT/ML Pen Inject into the skin. Patient not taking: Reported on 08/25/2021 02/25/21   [provider]  naproxen  sodium (ALEVE ) 220 MG tablet Take 220 mg by mouth daily as needed. Patient not taking: Reported on 04/25/2023    [provider]  psyllium (METAMUCIL) 58.6 % packet Take 1 packet by mouth daily as needed (constipation). Patient not taking: Reported on 04/25/2023    [provider]  rivaroxaban  (XARELTO ) 20 MG TABS tablet Take 1 tablet (20 mg total) by mouth daily with supper. Patient not taking: Reported on 12/21/2022 01/04/22   Abigail Bernardino HERO, PA-C  simethicone (MYLICON) 80 MG chewable tablet Chew 80 mg by mouth every 6 (six) hours as needed for flatulence. Patient not taking: Reported  on 04/25/2023    [provider]  Skin Protectants, Misc. (DIMETHICONE-ZINC OXIDE) cream Apply topically 2 (two) times daily as needed for dry skin. Please label the bottle for the vaginal area only. Patient not taking: Reported on 04/25/2023 06/29/20   Helon Kirsch A, PA-C  SODIUM FLUORIDE 5000 PPM 1.1 % GEL dental gel Take by mouth. Patient not taking: Reported on 04/25/2023 05/10/21   [provider]     Critical care time: 55 minutes     Inge Lecher, AGACNP-BC [facility] Prefer epic messenger for cross cover needs If after hours, please call E-link

## 2023-04-26 NOTE — Assessment & Plan Note (Addendum)
-   resume eliquis as able pending improved mentation - if prolonged NPO, may need heparin drip - monitor rate on tele

## 2023-04-26 NOTE — Progress Notes (Signed)
 I attempted to round on the patient this morning to discuss the need for follow up right ureteroscopy after clearance of her infection, however she was minimally rousable at that time. I reattempted at midday, however she has now been transferred to the ICU for a higher level of care. We will continue to monitor.  Amardeep Beckers, PA-C 04/26/23 12:25 PM

## 2023-04-26 NOTE — Assessment & Plan Note (Addendum)
-   baseline creatinine ~ 0.7 - patient presents with increase in creat >0.3 mg/dL above baseline or creat increase >1.5x baseline presumed to have occurred within past 7 days PTA - creat 1.26 with some uptrend today to 1.35 -Suspect multifactorial from bladder outlet obstruction but may also have some component of volume depletion given severe sepsis on admission with development of septic shock - continue IVF - trend BMP

## 2023-04-26 NOTE — Progress Notes (Signed)
 Anticoagulation monitoring(Lovenox ):  89 yo female ordered Lovenox  40 mg Q24h    Filed Weights   04/26/23 0112  Weight: 78.1 kg (172 lb 2.9 oz)   BMI 29.6   Lab Results  Component Value Date   CREATININE 1.26 (H) 04/25/2023   CREATININE 0.75 01/12/2022   CREATININE 0.66 01/03/2022   Estimated Creatinine Clearance: 28.2 mL/min (A) (by C-G formula based on SCr of 1.26 mg/dL (H)). Hemoglobin & Hematocrit     Component Value Date/Time   HGB 12.7 04/25/2023 1803   HGB 12.6 09/26/2019 1011   HCT 37.2 04/25/2023 1803   HCT 37.7 09/26/2019 1011     Per Protocol for Patient with estCrcl < 30 ml/min and BMI < 30, will transition to Lovenox  30 mg Q24h.

## 2023-04-26 NOTE — Assessment & Plan Note (Addendum)
-   Currently unable to take oral medications due to decreased level of consciousness and mentation -If prolonged n.p.o., will need to convert to IV form - check TSH

## 2023-04-26 NOTE — Progress Notes (Signed)
 Progress Note    RHIA BLATCHFORD   FMW:[id number]  DOB: [birth year]  DOA: 04/25/2023     1 PCP: Perla Evalene PARAS, MD  Initial CC: Dysuria, altered mentation  Hospital Course: [name] is a 89 yo female with PMH anxiety, chronic diastolic CHF, CAD, DM II, GERD, HTN, HLD, hypothyroidism, PAF who presented with altered mentation and dysuria.  She resides at skilled nursing facility and her urine began looking more cloudy and she was referred to the ER. On arrival while she was found to be febrile with mild leukocytosis and elevated lactic acid.  UA concerning for infection as well. CT renal stone protocol was performed showing obstructed distal right ureter with 2 mm stone.  Urology was consulted and she underwent cystoscopy with right ureteral stent placement.  Blood cultures after admission also began growing E. coli ESBL.  She had already been started on meropenem  on admission as well after her allergy profile was taken into consideration. After cystoscopy, she developed worsening refractory hypotension despite fluid resuscitation.  Lactic acid further up trended and she was transferred to the ICU for vasopressor support.  Interval History:  Patient seen this morning minimally responsive in bed with persistent hypotension.  Maintenance fluids were running and further fluid bolus given with minimal response of blood pressure and worsening lactic acid on repeat. Transferring patient to ICU as appears to need vasopressor support at this time.  Discussed with ICU team.  Assessment and Plan: * Acute unilateral obstructive uropathy - Patient presented with dysuria and AMS; CT renal protocol noted with Obstructive 2 mm mid to distal right ureterolithiasis. - s/p right ureteral stent placement with urology on 04/25/2023 - further follow-up with urology outpatient  Septic shock (HCC) - fever, tachycardia, tachypnea, leukocytosis, and lactic acidosis - stabilized initially with IVF and abx but  after cysto has further decompensated; likely some further translocation of bacteria - lactic now uptrending; given repeat 1L LR while lactic returned at 6.3 - continue LR maintenance - s/p fluid resuscitation 3.5 L with refractory hypotension - cortisol appropriate  -Transfer to ICU and start Levophed  - discussed with ICU team  -Continue meropenem , see bacteremia  E coli bacteremia - 3/4 bottles from admission growing ESBL E. coli.  Presumed from urinary source -Urine culture with multiple species.  No need to recollect at this time - Continue meropenem ; may need prolonged hospitalization to complete course for bacteremia and complicated UTI  AKI (acute kidney injury) (HCC) - baseline creatinine ~ 0.7 - patient presents with increase in creat >0.3 mg/dL above baseline or creat increase >1.5x baseline presumed to have occurred within past 7 days PTA - creat 1.26 with some uptrend today to 1.35 -Suspect multifactorial from bladder outlet obstruction but may also have some component of volume depletion given severe sepsis on admission with development of septic shock - continue IVF - trend BMP   Paroxysmal atrial fibrillation (HCC) - resume eliquis  as able pending improved mentation - if prolonged NPO, may need heparin drip - monitor rate on tele   Type 2 diabetes mellitus with peripheral neuropathy (HCC) - q4h SSI and CBG until mentation improves   Hypothyroidism - Currently unable to take oral medications due to decreased level of consciousness and mentation -If prolonged n.p.o., will need to convert to IV form - check TSH  Hypokalemia - Replete as needed  GERD without esophagitis - d/c pepcid  for now - allergic to protonix   Chronic diastolic CHF (congestive heart failure) (HCC) - Last echo July  2020: EF 55 to 60%, impaired relaxation - Caution with fluids during resuscitation; monitor for overload    Old records reviewed in assessment of this  patient  Antimicrobials: Meropenem  04/25/2023 >> current  DVT prophylaxis:  enoxaparin  (LOVENOX ) injection 30 mg Start: 04/26/23 1100   Code Status:   Code Status: Limited: Do not attempt resuscitation (DNR) -DNR-LIMITED -Do Not Intubate/DNI   Mobility Assessment (Last 72 Hours)     Mobility Assessment     Row Name 04/26/23 [phone] 04/26/23 0051         Does patient have an order for bedrest or is patient medically unstable No - Continue assessment No - Continue assessment      What is the highest level of mobility based on the progressive mobility assessment? Level 1 (Bedfast) - Unable to balance while sitting on edge of bed Level 1 (Bedfast) - Unable to balance while sitting on edge of bed      Is the above level different from baseline mobility prior to current illness? Yes - Recommend PT order Yes - Recommend PT order               Barriers to discharge: none Disposition Plan:  SNF Status is: Inpt  Objective: Blood pressure 107/88, pulse 95, temperature 98.4 F (36.9 C), temperature source Axillary, resp. rate (!) 24, height 5' 4 (1.626 m), weight 79.1 kg, SpO2 94%.  Examination:  Physical Exam Constitutional:      Comments: Minimally responsive elderly woman lying in bed appearing to be in no distress.  Grimaces to sternal rub and opens eyes.  Does squeeze fingers with her hands to command  HENT:     Head: Normocephalic and atraumatic.     Mouth/Throat:     Mouth: Mucous membranes are dry.  Eyes:     Extraocular Movements: Extraocular movements intact.  Cardiovascular:     Rate and Rhythm: Normal rate and regular rhythm.  Pulmonary:     Effort: Pulmonary effort is normal. No respiratory distress.     Breath sounds: Normal breath sounds. No wheezing.  Abdominal:     General: Bowel sounds are normal. There is no distension.     Palpations: Abdomen is soft.     Tenderness: There is no abdominal tenderness.  Musculoskeletal:        General: Normal range of motion.      Cervical back: Normal range of motion and neck supple.  Skin:    General: Skin is warm and dry.  Neurological:     Comments: Grimaces to sternal rub and follows some commands      Consultants:  Urology  Procedures:  04/25/23: Right ureteral stent placement   Data Reviewed: Results for orders placed or performed during the hospital encounter of 04/25/23 (from the past 24 hours)  CBC with Differential     Status: Abnormal   Collection Time: 04/25/23  6:03 PM  Result Value Ref Range   WBC 12.2 (H) 4.0 - 10.5 K/uL   RBC 3.76 (L) 3.87 - 5.11 MIL/uL   Hemoglobin 12.7 12.0 - 15.0 g/dL   HCT 62.7 63.9 - 53.9 %   MCV 98.9 80.0 - 100.0 fL   MCH 33.8 26.0 - 34.0 pg   MCHC 34.1 30.0 - 36.0 g/dL   RDW 87.0 88.4 - 84.4 %   Platelets 164 150 - 400 K/uL   nRBC 0.0 0.0 - 0.2 %   Neutrophils Relative % 82 %   Neutro Abs 10.0 (H) 1.7 -  7.7 K/uL   Lymphocytes Relative 9 %   Lymphs Abs 1.1 0.7 - 4.0 K/uL   Monocytes Relative 9 %   Monocytes Absolute 1.1 (H) 0.1 - 1.0 K/uL   Eosinophils Relative 0 %   Eosinophils Absolute 0.0 0.0 - 0.5 K/uL   Basophils Relative 0 %   Basophils Absolute 0.0 0.0 - 0.1 K/uL   Immature Granulocytes 0 %   Abs Immature Granulocytes 0.05 0.00 - 0.07 K/uL  Comprehensive metabolic panel     Status: Abnormal   Collection Time: 04/25/23  6:03 PM  Result Value Ref Range   Sodium 132 (L) 135 - 145 mmol/L   Potassium 3.7 3.5 - 5.1 mmol/L   Chloride 95 (L) 98 - 111 mmol/L   CO2 28 22 - 32 mmol/L   Glucose, Bld 143 (H) 70 - 99 mg/dL   BUN 22 8 - 23 mg/dL   Creatinine, Ser 8.73 (H) 0.44 - 1.00 mg/dL   Calcium  8.9 8.9 - 10.3 mg/dL   Total Protein 7.7 6.5 - 8.1 g/dL   Albumin 3.1 (L) 3.5 - 5.0 g/dL   AST 43 (H) 15 - 41 U/L   ALT 15 0 - 44 U/L   Alkaline Phosphatase 99 38 - 126 U/L   Total Bilirubin 1.0 0.0 - 1.2 mg/dL   GFR, Estimated 40 (L) >60 mL/min   Anion gap 9 5 - 15  Lipase, blood     Status: None   Collection Time: 04/25/23  6:03 PM  Result Value Ref  Range   Lipase 28 11 - 51 U/L  Blood culture (routine x 2)     Status: None (Preliminary result)   Collection Time: 04/25/23  6:03 PM   Specimen: BLOOD  Result Value Ref Range   Specimen Description      BLOOD BLOOD RIGHT ARM Performed at [facility], [address] [place], [place] [address]    Special Requests      BOTTLES DRAWN AEROBIC AND ANAEROBIC Blood Culture adequate volume Performed at [facility], [address] [place], [place] [address]    Culture  Setup Time      GRAM NEGATIVE RODS IN BOTH AEROBIC AND ANAEROBIC BOTTLES CRITICAL RESULT CALLED TO, READ BACK BY AND VERIFIED WITH: WILL ANDERSON 04/26/23 0756 MW Performed at [facility], [address], [place], [place] [address]    Culture GRAM NEGATIVE RODS    Report Status PENDING   Blood culture (routine x 2)     Status: None (Preliminary result)   Collection Time: 04/25/23  6:03 PM   Specimen: BLOOD  Result Value Ref Range   Specimen Description BLOOD BLOOD LEFT ARM    Special Requests      BOTTLES DRAWN AEROBIC AND ANAEROBIC Blood Culture results may not be optimal due to an inadequate volume of blood received in culture bottles   Culture  Setup Time      GRAM NEGATIVE RODS AEROBIC BOTTLE ONLY CRITICAL RESULT CALLED TO, READ BACK BY AND VERIFIED WITH: WILL LENON 04/26/23 0756 MW Performed at [facility], [address], [place], [place] [address]    Culture GRAM NEGATIVE RODS    Report Status PENDING   Lactic acid, plasma     Status: Abnormal   Collection Time: 04/25/23  6:03 PM  Result Value Ref Range   Lactic Acid, Venous 2.4 (HH) 0.5 - 1.9 mmol/L  Urinalysis, Routine w reflex microscopic -Urine, Clean Catch     Status:  Abnormal   Collection Time: 04/25/23  6:03 PM  Result Value Ref Range   Color, Urine YELLOW (A) YELLOW   APPearance TURBID (A) CLEAR   Specific Gravity, Urine 1.012 1.005 - 1.030   pH 5.0 5.0 - 8.0   Glucose, UA NEGATIVE NEGATIVE mg/dL   Hgb  urine dipstick MODERATE (A) NEGATIVE   Bilirubin Urine NEGATIVE NEGATIVE   Ketones, ur NEGATIVE NEGATIVE mg/dL   Protein, ur 899 (A) NEGATIVE mg/dL   Nitrite NEGATIVE NEGATIVE   Leukocytes,Ua MODERATE (A) NEGATIVE   RBC / HPF >50 0 - 5 RBC/hpf   WBC, UA >50 0 - 5 WBC/hpf   Bacteria, UA MANY (A) NONE SEEN   Squamous Epithelial / HPF 0-5 0 - 5 /HPF   WBC Clumps PRESENT    Mucus PRESENT   Resp panel by RT-PCR (RSV, Flu A&B, Covid) Urine, Clean Catch     Status: None   Collection Time: 04/25/23  6:03 PM   Specimen: Urine, Clean Catch; Nasal Swab  Result Value Ref Range   SARS Coronavirus 2 by RT PCR NEGATIVE NEGATIVE   Influenza A by PCR NEGATIVE NEGATIVE   Influenza B by PCR NEGATIVE NEGATIVE   Resp Syncytial Virus by PCR NEGATIVE NEGATIVE  Blood Culture ID Panel (Reflexed)     Status: Abnormal   Collection Time: 04/25/23  6:03 PM  Result Value Ref Range   Enterococcus faecalis NOT DETECTED NOT DETECTED   Enterococcus Faecium NOT DETECTED NOT DETECTED   Listeria monocytogenes NOT DETECTED NOT DETECTED   Staphylococcus species NOT DETECTED NOT DETECTED   Staphylococcus aureus (BCID) NOT DETECTED NOT DETECTED   Staphylococcus epidermidis NOT DETECTED NOT DETECTED   Staphylococcus lugdunensis NOT DETECTED NOT DETECTED   Streptococcus species NOT DETECTED NOT DETECTED   Streptococcus agalactiae NOT DETECTED NOT DETECTED   Streptococcus pneumoniae NOT DETECTED NOT DETECTED   Streptococcus pyogenes NOT DETECTED NOT DETECTED   A.calcoaceticus-baumannii NOT DETECTED NOT DETECTED   Bacteroides fragilis NOT DETECTED NOT DETECTED   Enterobacterales DETECTED (A) NOT DETECTED   Enterobacter cloacae complex NOT DETECTED NOT DETECTED   Escherichia coli DETECTED (A) NOT DETECTED   Klebsiella aerogenes NOT DETECTED NOT DETECTED   Klebsiella oxytoca NOT DETECTED NOT DETECTED   Klebsiella pneumoniae NOT DETECTED NOT DETECTED   Proteus species NOT DETECTED NOT DETECTED   Salmonella species NOT  DETECTED NOT DETECTED   Serratia marcescens NOT DETECTED NOT DETECTED   Haemophilus influenzae NOT DETECTED NOT DETECTED   Neisseria meningitidis NOT DETECTED NOT DETECTED   Pseudomonas aeruginosa NOT DETECTED NOT DETECTED   Stenotrophomonas maltophilia NOT DETECTED NOT DETECTED   Candida albicans NOT DETECTED NOT DETECTED   Candida auris NOT DETECTED NOT DETECTED   Candida glabrata NOT DETECTED NOT DETECTED   Candida krusei NOT DETECTED NOT DETECTED   Candida parapsilosis NOT DETECTED NOT DETECTED   Candida tropicalis NOT DETECTED NOT DETECTED   Cryptococcus neoformans/gattii NOT DETECTED NOT DETECTED   CTX-M ESBL DETECTED (A) NOT DETECTED   Carbapenem resistance IMP NOT DETECTED NOT DETECTED   Carbapenem resistance KPC NOT DETECTED NOT DETECTED   Carbapenem resistance NDM NOT DETECTED NOT DETECTED   Carbapenem resist OXA 48 LIKE NOT DETECTED NOT DETECTED   Carbapenem resistance VIM NOT DETECTED NOT DETECTED  Lactic acid, plasma     Status: None   Collection Time: 04/25/23  7:52 PM  Result Value Ref Range   Lactic Acid, Venous 1.1 0.5 - 1.9 mmol/L  CBG monitoring, ED  Status: Abnormal   Collection Time: 04/25/23 11:39 PM  Result Value Ref Range   Glucose-Capillary 115 (H) 70 - 99 mg/dL  Protime-INR     Status: Abnormal   Collection Time: 04/26/23  5:06 AM  Result Value Ref Range   Prothrombin Time 27.8 (H) 11.4 - 15.2 seconds   INR 2.6 (H) 0.8 - 1.2  Cortisol-am, blood     Status: Abnormal   Collection Time: 04/26/23  5:06 AM  Result Value Ref Range   Cortisol - AM 83.7 (H) 6.7 - 22.6 ug/dL  Basic metabolic panel     Status: Abnormal   Collection Time: 04/26/23  5:06 AM  Result Value Ref Range   Sodium 133 (L) 135 - 145 mmol/L   Potassium 3.3 (L) 3.5 - 5.1 mmol/L   Chloride 99 98 - 111 mmol/L   CO2 19 (L) 22 - 32 mmol/L   Glucose, Bld 131 (H) 70 - 99 mg/dL   BUN 25 (H) 8 - 23 mg/dL   Creatinine, Ser 8.64 (H) 0.44 - 1.00 mg/dL   Calcium  7.5 (L) 8.9 - 10.3 mg/dL    GFR, Estimated 37 (L) >60 mL/min   Anion gap 15 5 - 15  CBC     Status: Abnormal   Collection Time: 04/26/23  5:06 AM  Result Value Ref Range   WBC 9.4 4.0 - 10.5 K/uL   RBC 3.22 (L) 3.87 - 5.11 MIL/uL   Hemoglobin 11.1 (L) 12.0 - 15.0 g/dL   HCT 67.3 (L) 63.9 - 53.9 %   MCV 101.2 (H) 80.0 - 100.0 fL   MCH 34.5 (H) 26.0 - 34.0 pg   MCHC 34.0 30.0 - 36.0 g/dL   RDW 86.8 88.4 - 84.4 %   Platelets 97 (L) 150 - 400 K/uL   nRBC 0.0 0.0 - 0.2 %  Procalcitonin     Status: None   Collection Time: 04/26/23  6:06 AM  Result Value Ref Range   Procalcitonin 21.90 ng/mL  Glucose, capillary     Status: Abnormal   Collection Time: 04/26/23  8:43 AM  Result Value Ref Range   Glucose-Capillary 155 (H) 70 - 99 mg/dL  Lactic acid, plasma     Status: Abnormal   Collection Time: 04/26/23 10:24 AM  Result Value Ref Range   Lactic Acid, Venous 6.3 (HH) 0.5 - 1.9 mmol/L  Glucose, capillary     Status: Abnormal   Collection Time: 04/26/23 11:41 AM  Result Value Ref Range   Glucose-Capillary 140 (H) 70 - 99 mg/dL  Glucose, capillary     Status: Abnormal   Collection Time: 04/26/23 12:25 PM  Result Value Ref Range   Glucose-Capillary 171 (H) 70 - 99 mg/dL    I have reviewed pertinent nursing notes, vitals, labs, and images as necessary. I have ordered labwork to follow up on as indicated.  I have reviewed the last notes from staff over past 24 hours. I have discussed patient's care plan and test results with nursing staff, CM/SW, and other staff as appropriate.  Critical care time to evaluate and treat this patient was 55 minutes.  Independent of separate billable services  This patient is critically ill with the following life-threatening issues requiring my presence at the bedside: Hemodynamic instability requiring titration of medications Oxygenation/ventilation instability requiring frequent modifications of support Cardiac rhythm disturbances requiring evaluation and/or  interventions Fluctuations in neurologic function requiring evaluation and/or interventions and/or fluid/volume titration    LOS: 1 day   Alm Apo, MD  [facility] 04/26/2023, 12:47 PM

## 2023-04-27 ENCOUNTER — Inpatient Hospital Stay (HOSPITAL_COMMUNITY)
Admit: 2023-04-27 | Discharge: 2023-04-27 | Disposition: A | Discharge status: Home / Self Care | Payer: Medicare HMO | Attending: Pulmonary Disease | Admitting: Pulmonary Disease

## 2023-04-27 ENCOUNTER — Inpatient Hospital Stay: Payer: Medicare HMO

## 2023-04-27 DIAGNOSIS — A419 Sepsis, unspecified organism: Secondary | ICD-10-CM | POA: Diagnosis not present

## 2023-04-27 DIAGNOSIS — R578 Other shock: Secondary | ICD-10-CM | POA: Diagnosis not present

## 2023-04-27 DIAGNOSIS — I480 Paroxysmal atrial fibrillation: Secondary | ICD-10-CM | POA: Diagnosis not present

## 2023-04-27 DIAGNOSIS — N139 Obstructive and reflux uropathy, unspecified: Secondary | ICD-10-CM | POA: Diagnosis not present

## 2023-04-27 DIAGNOSIS — N179 Acute kidney failure, unspecified: Secondary | ICD-10-CM | POA: Diagnosis not present

## 2023-04-27 LAB — CBC WITH DIFFERENTIAL/PLATELET
Abs Immature Granulocytes: 5.25 10*3/uL — ABNORMAL HIGH (ref 0.00–0.07)
Abs Immature Granulocytes: 6.04 10*3/uL — ABNORMAL HIGH (ref 0.00–0.07)
Basophils Absolute: 0 10*3/uL (ref 0.0–0.1)
Basophils Absolute: 0 10*3/uL (ref 0.0–0.1)
Basophils Relative: 0 %
Basophils Relative: 0 %
Eosinophils Absolute: 0 10*3/uL (ref 0.0–0.5)
Eosinophils Absolute: 0 10*3/uL (ref 0.0–0.5)
Eosinophils Relative: 0 %
Eosinophils Relative: 0 %
HCT: 35.9 % — ABNORMAL LOW (ref 36.0–46.0)
HCT: 34.9 % — ABNORMAL LOW (ref 36.0–46.0)
Hemoglobin: 12.4 g/dL (ref 12.0–15.0)
Hemoglobin: 11.9 g/dL — ABNORMAL LOW (ref 12.0–15.0)
Immature Granulocytes: 9 %
Immature Granulocytes: 11 %
Lymphocytes Relative: 1 %
Lymphocytes Relative: 1 %
Lymphs Abs: 0.6 10*3/uL — ABNORMAL LOW (ref 0.7–4.0)
Lymphs Abs: 0.6 10*3/uL — ABNORMAL LOW (ref 0.7–4.0)
MCH: 34.3 pg — ABNORMAL HIGH (ref 26.0–34.0)
MCH: 33.9 pg (ref 26.0–34.0)
MCHC: 34.5 g/dL (ref 30.0–36.0)
MCHC: 34.1 g/dL (ref 30.0–36.0)
MCV: 99.4 fL (ref 80.0–100.0)
MCV: 99.4 fL (ref 80.0–100.0)
Monocytes Absolute: 1.2 10*3/uL — ABNORMAL HIGH (ref 0.1–1.0)
Monocytes Absolute: 1.1 10*3/uL — ABNORMAL HIGH (ref 0.1–1.0)
Monocytes Relative: 2 %
Monocytes Relative: 2 %
Neutro Abs: 52.4 10*3/uL — ABNORMAL HIGH (ref 1.7–7.7)
Neutro Abs: 50.1 10*3/uL — ABNORMAL HIGH (ref 1.7–7.7)
Neutrophils Relative %: 88 %
Neutrophils Relative %: 86 %
Platelets: 116 10*3/uL — ABNORMAL LOW (ref 150–400)
Platelets: 101 10*3/uL — ABNORMAL LOW (ref 150–400)
RBC: 3.61 MIL/uL — ABNORMAL LOW (ref 3.87–5.11)
RBC: 3.51 MIL/uL — ABNORMAL LOW (ref 3.87–5.11)
RDW: 13.5 % (ref 11.5–15.5)
RDW: 13.7 % (ref 11.5–15.5)
Smear Review: NORMAL
Smear Review: NORMAL
WBC: 59.6 10*3/uL (ref 4.0–10.5)
WBC: 57.8 10*3/uL (ref 4.0–10.5)
nRBC: 0 % (ref 0.0–0.2)
nRBC: 0 % (ref 0.0–0.2)

## 2023-04-27 LAB — LACTIC ACID, PLASMA
Lactic Acid, Venous: 5.2 mmol/L (ref 0.5–1.9)
Lactic Acid, Venous: 4.9 mmol/L (ref 0.5–1.9)
Lactic Acid, Venous: 3.9 mmol/L (ref 0.5–1.9)
Lactic Acid, Venous: 3.8 mmol/L (ref 0.5–1.9)
Lactic Acid, Venous: 4.3 mmol/L (ref 0.5–1.9)
Lactic Acid, Venous: 4 mmol/L (ref 0.5–1.9)

## 2023-04-27 LAB — COMPREHENSIVE METABOLIC PANEL
ALT: 27 U/L (ref 0–44)
AST: 137 U/L — ABNORMAL HIGH (ref 15–41)
Albumin: 2.3 g/dL — ABNORMAL LOW (ref 3.5–5.0)
Alkaline Phosphatase: 166 U/L — ABNORMAL HIGH (ref 38–126)
Anion gap: 15 (ref 5–15)
BUN: 39 mg/dL — ABNORMAL HIGH (ref 8–23)
CO2: 19 mmol/L — ABNORMAL LOW (ref 22–32)
Calcium: 7.5 mg/dL — ABNORMAL LOW (ref 8.9–10.3)
Chloride: 98 mmol/L (ref 98–111)
Creatinine, Ser: 2.11 mg/dL — ABNORMAL HIGH (ref 0.44–1.00)
GFR, Estimated: 21 mL/min — ABNORMAL LOW (ref >60)
Glucose, Bld: 177 mg/dL — ABNORMAL HIGH (ref 70–99)
Potassium: 3.9 mmol/L (ref 3.5–5.1)
Sodium: 132 mmol/L — ABNORMAL LOW (ref 135–145)
Total Bilirubin: 1.7 mg/dL — ABNORMAL HIGH (ref 0.0–1.2)
Total Protein: 6.2 g/dL — ABNORMAL LOW (ref 6.5–8.1)

## 2023-04-27 LAB — ECHOCARDIOGRAM COMPLETE
AR max vel: 1.88 cm2
AV Area VTI: 2.04 cm2
AV Area mean vel: 1.87 cm2
AV Mean grad: 2 mm[Hg]
AV Peak grad: 3.1 mm[Hg]
Ao pk vel: 0.88 m/s
Area-P 1/2: 5.54 cm2
Height: 64 in
S' Lateral: 2.3 cm
Weight: 2790.14 [oz_av]

## 2023-04-27 LAB — GLUCOSE, CAPILLARY
Glucose-Capillary: 144 mg/dL — ABNORMAL HIGH (ref 70–99)
Glucose-Capillary: 148 mg/dL — ABNORMAL HIGH (ref 70–99)
Glucose-Capillary: 154 mg/dL — ABNORMAL HIGH (ref 70–99)
Glucose-Capillary: 155 mg/dL — ABNORMAL HIGH (ref 70–99)
Glucose-Capillary: 171 mg/dL — ABNORMAL HIGH (ref 70–99)
Glucose-Capillary: 171 mg/dL — ABNORMAL HIGH (ref 70–99)
Glucose-Capillary: 181 mg/dL — ABNORMAL HIGH (ref 70–99)

## 2023-04-27 LAB — PROCALCITONIN: Procalcitonin: 53.96 ng/mL

## 2023-04-27 LAB — MRSA NEXT GEN BY PCR, NASAL: MRSA by PCR Next Gen: DETECTED — AB

## 2023-04-27 LAB — PHOSPHORUS: Phosphorus: 3.4 mg/dL (ref 2.5–4.6)

## 2023-04-27 LAB — MAGNESIUM: Magnesium: 1.7 mg/dL (ref 1.7–2.4)

## 2023-04-27 MED ORDER — LORAZEPAM 2 MG/ML IJ SOLN
INTRAMUSCULAR | Status: AC
  Filled 2023-04-27: qty 1

## 2023-04-27 MED ORDER — MELATONIN 5 MG PO TABS
5.0000 mg | ORAL_TABLET | Freq: Every day | ORAL | Status: DC
  Administered 2023-04-27 – 2023-05-06 (×10): 5 mg via ORAL
  Filled 2023-04-27 (×10): qty 1

## 2023-04-27 MED ORDER — LORAZEPAM 2 MG/ML IJ SOLN
2.0000 mg | Freq: Once | INTRAMUSCULAR | Status: DC
  Filled 2023-04-27: qty 1

## 2023-04-27 NOTE — Plan of Care (Signed)
  Problem: Fluid Volume: Goal: Hemodynamic stability will improve Outcome: Progressing   Problem: Respiratory: Goal: Ability to maintain adequate ventilation will improve Outcome: Progressing   Problem: Clinical Measurements: Goal: Respiratory complications will improve Outcome: Progressing Goal: Cardiovascular complication will be avoided Outcome: Progressing   Problem: Safety: Goal: Ability to remain free from injury will improve Outcome: Progressing

## 2023-04-27 NOTE — Progress Notes (Signed)
 NAME:  Jodi Reeves, MRN:  [id number], DOB:  05/08/1927, LOS: 2 ADMISSION DATE:  04/25/2023, CONSULTATION DATE:  04/26/2023 REFERRING MD:  Dr. Patsy, CHIEF COMPLAINT:  Septic shock   Brief Pt Description / Synopsis:  89 y.o. female admitted with Acute Metabolic Encephalopathy in setting of Severe Sepsis due to  ESBL E. Coli BACTEREMIA due to Right Ureteral Stone infection, status post Cystoscopy and right ureteral stent placement.  Course complicated by development of septic shock.  History of Present Illness:  Jodi Reeves is a 89 y.o. female with a past medical history significant for anxiety, coronary artery disease, diastolic CHF, GERD, hypertension, dyslipidemia, hypothyroidism, TIA and IBS, who presented to the ER on 04/25/23 with acute onset of dysuria with altered mental status.  Pt is currently altered and unable to contribute to history and no family currently available, therefore history is obtained from chart review.     Pertinent  Medical History   Past Medical History:  Diagnosis Date   Allergy    Anxiety    Balance disorder    Chronic diastolic CHF (congestive heart failure) (HCC)    a. 10/2018 Echo: EF 55-60%, diast dysfxn, nl RV fxn. RVSP 45.85mmHg. Mild-mod TR. Mod dil LA.   Colon polyps    Coronary artery disease, non-occlusive    a. LHC 11/2003: 10% LAD stenosis, 20% LCx stenosis, and 40% RCA stenosis; b. nuc stress test 12/15: small region of mild ischemia in the apical region with WMA also noted in the apical region, EF 60%. 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 CHADS2VASc = > 8 (CHF, HTN, age x 2, DM, TIA x 2, female); b. on Xarelto    SBO (small bowel obstruction) (HCC)    1998   TIA (transient ischemic attack)    UTI (urinary tract infection)    Venous insufficiency    Ventral hernia     Micro Data:  1/1: SARS-CoV-2/RSV /Flu PCR>>negative 1/1: Blood cultures>> ESBL E. Coli 1/1: Urine>>  Antimicrobials:   Anti-infectives (From admission, onward)    Start     Dose/Rate Route Frequency Ordered Stop   04/26/23 0800  meropenem  (MERREM ) 1 g in sodium chloride  0.9 % 100 mL IVPB        1 g 200 mL/hr over 30 Minutes Intravenous Every 12 hours 04/26/23 0339     04/25/23 2300  meropenem  (MERREM ) 1 g in sodium chloride  0.9 % 100 mL IVPB  Status:  Discontinued        1 g 200 mL/hr over 30 Minutes Intravenous  Once 04/25/23 2250 04/26/23 0340   04/25/23 1900  meropenem  (MERREM ) 1 g in sodium chloride  0.9 % 100 mL IVPB  1 g 200 mL/hr over 30 Minutes Intravenous  Once 04/25/23 1854 04/25/23 2134       Significant Hospital Events: Including procedures, antibiotic start and stop dates in addition to other pertinent events   1/1: Presented to ED with AMS, admitted by Hospitalist for sepsis due to infect right ureteral stone.  Urology consulted, performed Right ureteral stent. 1/2: Became hypotensive concerning for developing septic shock.  Transferred to ICU with PCCM consultation for potential vasopressors. 04/27/23 - patient on 15mcg/kg/min today.  She continues to stay critically ill with ESBL bacteremia.  Prognosis is poor due to advanced age with septic shock. 1Her bloodwork shows interval marked increase in WBC count She remains on levophed  and meropenem .  Have ordered pathology slide reivew    Objective   Blood pressure  129/84, pulse 98, temperature 98.9 F (37.2 C), temperature source Axillary, resp. rate 20, height 5' 4 (1.626 m), weight 79.1 kg, SpO2 (!) 89%.        Intake/Output Summary (Last 24 hours) at 04/27/2023 0853 Last data filed at 04/27/2023 9182 Gross per 24 hour  Intake 1840.53 ml  Output 480 ml  Net 1360.53 ml   Filed Weights   04/26/23 0112 04/26/23 1219  Weight: 78.1 kg 79.1 kg    Examination: General: Acute on chronically ill-appearing elderly female, laying in bed HENT: Atraumatic, normocephalic, neck supple, no JVD Lungs: Clear breath sounds throughout, even, nonlabored, normal effort Cardiovascular: Irregularly irregular rhythm, rate controlled, no murmurs, rubs, gallops Abdomen: Obese, soft, nontender, no guarding or rebound tenderness, bowel sounds positive x 4 Extremities: Generalized weakness, normal bulk and tone, no deformities, trace edema to bilateral lower extremities Neuro: Lethargic, arouses to pain, currently not following commands, pupils PERRLA GU: Foley catheter in place Skin: No FND grossly   Resolved Hospital Problem list     Assessment & Plan:   #Septic Shock-PRESENT ON ADMISSION - DUE TO ESBL BACTEREMIA #Paroxymal Atrial Fibrillation -Continuous cardiac monitoring -Maintain MAP >65 -IV fluids -Vasopressors as needed to maintain MAP goal -Trend lactic acid until normalized -Trend HS Troponin until peaked -Echocardiogram pending -May need to consider starting for Heparin gtt for anticoagulation ~ but will defer to primary service and Urology on timing as pt had ureteral stent placed yesterfday  #Severe Sepsis  #ESBL E. Coli BACTEREMIA  #Right Ureteral Stone infection ~ status post Right Ureteral Stent on 1/1 -Monitor fever curve -Trend WBC's & Procalcitonin -Follow cultures as above -Continue empiric Meropenem  pending cultures & sensitivities -Urology following, appreciate input  #Acute Kidney Injury #AG Metabolic Acidosis #Mild  Hypokalemia #Mild Hyponatremia -Monitor I&O's / urinary output -Follow BMP -Ensure adequate renal perfusion -Avoid nephrotoxic agents as able -Replace electrolytes as indicated ~ Pharmacy following for assistance with electrolyte replacement -IV fluids  #Thrombocytopenia -Monitor for S/Sx of bleeding -Trend CBC -Lovenox  for VTE Prophylaxis  -Transfuse for Hgb <7  #Diabetes Mellitus Type II #Hypothyroidism -CBG's q4h; Target range of 140 to 180 -SSI -Follow ICU Hypo/Hyperglycemia protocol -Continue Synthroid   #Acute Metabolic Encephalopathy -Treatment of sepsis and metabolic derangements as outlined above -Provide supportive care -Promote normal sleep/wake cycle and family presence -Avoid sedating medications as able -Check ABG    Best Practice (right click and Reselect all SmartList Selections daily)   Diet/type: NPO DVT prophylaxis: LMWH GI prophylaxis: N/A Lines: N/A Foley:  Yes, and it is still needed Code Status:  DNR Last date of multidisciplinary goals of care discussion [N/A]    Labs   CBC: Recent Labs  Lab 04/25/23 1803 04/26/23  9493 04/26/23 1258 04/27/23 0350  WBC 12.2* 9.4 27.9* 59.6*  NEUTROABS 10.0*  --   --  52.4*  HGB 12.7 11.1* 11.2* 12.4  HCT 37.2 32.6* 32.8* 35.9*  MCV 98.9 101.2* 100.0 99.4  PLT 164 97* 93* 116*    Basic Metabolic Panel: Recent Labs  Lab 04/25/23 1803 04/26/23 0506 04/26/23 1258 04/26/23 2028 04/27/23 0350  NA 132* 133* 133* 132* 132*  K 3.7 3.3* 3.6 3.7 3.9  CL 95* 99 98 99 98  CO2 28 19* 18* 14* 19*  GLUCOSE 143* 131* 172* 224* 177*  BUN 22 25* 28* 35* 39*  CREATININE 1.26* 1.35* 1.88* 2.23* 2.11*  CALCIUM  8.9 7.5* 7.6* 7.8* 7.5*  MG  --   --   --   --  1.7  PHOS  --   --   --   --  3.4   GFR: Estimated Creatinine Clearance: 17 mL/min (A) (by C-G formula based on SCr of 2.11 mg/dL (H)). Recent Labs  Lab 04/25/23 1803 04/25/23 1952 04/26/23 0506 04/26/23 0606 04/26/23 1024 04/26/23 1258  04/26/23 1753 04/26/23 2028 04/27/23 0247 04/27/23 0350 04/27/23 0505 04/27/23 0755  PROCALCITON  --   --   --  21.90  --   --   --   --   --  53.96  --   --   WBC 12.2*  --  9.4  --   --  27.9*  --   --   --  59.6*  --   --   LATICACIDVEN 2.4*   < >  --   --    < > 5.6*   < > 5.8* 5.2*  --  4.9* 3.9*   < > = values in this interval not displayed.    Liver Function Tests: Recent Labs  Lab 04/25/23 1803 04/27/23 0350  AST 43* 137*  ALT 15 27  ALKPHOS 99 166*  BILITOT 1.0 1.7*  PROT 7.7 6.2*  ALBUMIN 3.1* 2.3*   Recent Labs  Lab 04/25/23 1803  LIPASE 28   No results for input(s): AMMONIA in the last 168 hours.  ABG    Component Value Date/Time   PHART 7.39 04/26/2023 2028   PCO2ART 24 (L) 04/26/2023 2028   PO2ART 73 (L) 04/26/2023 2028   HCO3 14.5 (L) 04/26/2023 2028   ACIDBASEDEF 8.6 (H) 04/26/2023 2028   O2SAT 97.8 04/26/2023 2028     Coagulation Profile: Recent Labs  Lab 04/26/23 0506  INR 2.6*    Cardiac Enzymes: No results for input(s): CKTOTAL, CKMB, CKMBINDEX, TROPONINI in the last 168 hours.  HbA1C: Hgb A1c MFr Bld  Date/Time Value Ref Range Status  04/26/2023 05:06 AM 8.0 (H) 4.8 - 5.6 % Final    Comment:    (NOTE)         Prediabetes: 5.7 - 6.4         Diabetes: >6.4         Glycemic control for adults with diabetes: <7.0   10/10/2020 08:10 AM 7.8 (H) 4.8 - 5.6 % Final    Comment:    (NOTE)         Prediabetes: 5.7 - 6.4         Diabetes: >6.4         Glycemic control for adults with diabetes: <7.0     CBG: Recent Labs  Lab 04/26/23 1225 04/26/23 1606 04/26/23 2029 04/26/23 2338 04/27/23 0352  GLUCAP 171* 186* 193* 171* 171*    Review  of Systems:   Unable to assess due to AMS   Past Medical History:  She,  has a past medical history of Allergy, Anxiety, Balance disorder, Chronic diastolic CHF (congestive heart failure) (HCC), Colon polyps, Coronary artery disease, non-occlusive, DM2 (diabetes mellitus, type 2)  (HCC), Fall, Falls frequently, GERD (gastroesophageal reflux disease), Granuloma annulare, History of chicken pox, History of eating disorder, HLD (hyperlipidemia), HTN (hypertension), Hypothyroidism, IBS (irritable bowel syndrome), Incontinence of bowel, Osteopenia, Osteoporosis, Ovarian cancer (HCC), Persistent atrial fibrillation (HCC), SBO (small bowel obstruction) (HCC), TIA (transient ischemic attack), UTI (urinary tract infection), Venous insufficiency, and Ventral hernia.   Surgical History:   Past Surgical History:  Procedure Laterality Date   2nd look laparotomy  1982   ABDOMINAL HYSTERECTOMY     for ovarian cancer s/p hysterectomy total 1976 and exp lap 1980   APPENDECTOMY     BACK SURGERY     CARDIAC CATHETERIZATION  8/05   neg; a fib found    CARPAL TUNNEL RELEASE Left 03/06/2018   Procedure: CARPAL TUNNEL RELEASE ENDOSCOPIC;  Surgeon: Edie Norleen PARAS, MD;  Location: [facility];  Service: Orthopedics;  Laterality: Left;  diabetic - oral meds   CARPAL TUNNEL RELEASE     left CTS Dr.Poggi   cataract OD  2002   cataract surgery  5/07   R   CHOLECYSTECTOMY  1986   CYSTOSCOPY WITH RETROGRADE PYELOGRAM, URETEROSCOPY AND STENT PLACEMENT Right 04/25/2023   Procedure: CYSTOSCOPY WITH RETROGRADE PYELOGRAM, URETEROSCOPY AND STENT PLACEMENT;  Surgeon: Selma Donnice SAUNDERS, MD;  Location: [facility];  Service: Urology;  Laterality: Right;   DEXA  9/04 and 1/02   EGD/dilation/colon  1/06   ESOPHAGOGASTRODUODENOSCOPY (EGD) WITH PROPOFOL  N/A 05/16/2016   Procedure: ESOPHAGOGASTRODUODENOSCOPY (EGD) WITH PROPOFOL  with dilation;  Surgeon: Rogelia Copping, MD;  Location: [facility];  Service: Endoscopy;  Laterality: N/A;   FEMORAL HERNIA REPAIR  1958   R   fracture L elbow/wrist  2000   INTRAMEDULLARY (IM) NAIL INTERTROCHANTERIC Left 10/12/2020   Procedure: INTRAMEDULLARY (IM) NAIL INTERTROCHANTRIC;  Surgeon: Krasinski, Kevin, MD;  Location: [facility];  Service: Orthopedics;  Laterality: Left;    intussception/obstruction  12/98   JOINT REPLACEMENT     left shoulder   kidney stone x2  1991   KYPHOPLASTY N/A 04/13/2020   Procedure: [id number];  Surgeon: Kathlynn Sharper, MD;  Location: [facility];  Service: Orthopedics;  Laterality: N/A;   KYPHOPLASTY N/A 05/18/2020   Procedure: L1 KYPHOPLASTY;  Surgeon: Kathlynn Sharper, MD;  Location: [facility];  Service: Orthopedics;  Laterality: N/A;   KYPHOPLASTY N/A 07/06/2020   Procedure: L4  KYPHOPLASTY;  Surgeon: Kathlynn Sharper, MD;  Location: [facility];  Service: Orthopedics;  Laterality: N/A;   laminectomy L4-5  1971   LUMBAR LAMINECTOMY     1970 ruptured disc    MOUTH SURGERY     myoview  stress  8/05   syncope (-)   REVERSE SHOULDER ARTHROPLASTY Left 10/06/2014   Procedure: REVERSE SHOULDER ARTHROPLASTY;  Surgeon: Norleen PARAS Edie, MD;  Location: [facility];  Service: Orthopedics;  Laterality: Left;   stillbirth  1969   TOTAL ABDOMINAL HYSTERECTOMY W/ BILATERAL SALPINGOOPHORECTOMY  1980   ovarian cancer   WRIST FRACTURE SURGERY  11/05   R     Social History:   reports that she has never smoked. She has never used smokeless tobacco. She reports that she does not drink alcohol  and does not use drugs.   Family History:  Her family history includes Arthritis in her  brother, brother, and sister; COPD in her brother; Cancer in her brother, brother, and brother; Depression in her brother and sister; Diabetes in her brother, mother, and paternal grandfather; Early death in her brother, brother, and father; Hearing loss in her brother, brother, brother, daughter, sister, and sister; Heart disease in her brother, mother, sister, sister, and sister; Hyperlipidemia in her brother; Hypertension in her daughter and sister; Stroke in her sister.   Allergies Allergies  Allergen Reactions   Amiodarone  Other (See Comments)    Severe Thyroid  issues    Fosamax [Alendronate]     dysphagia   Penicillins Anaphylaxis and Other (See Comments)    TOLERATED CEFAZOLIN   07/06/20 Has patient had a PCN reaction causing immediate rash, facial/tongue/throat swelling, SOB or lightheadedness with hypotension: Yes Has patient had a PCN reaction causing severe rash involving mucus membranes or skin necrosis: No Has patient had a PCN reaction that required hospitalization: No Has patient had a PCN reaction occurring within the last 10 years: No If all of the above answers are NO, then may proceed with Cephalosporin use.    Sulfa Antibiotics Itching and Rash    Itching    Actos [Pioglitazone]     Not effective    Amaryl [Glimepiride]     Not effective     Atorvastatin Other (See Comments)    Muscle aches & All statins per Patient   Bentyl  [Dicyclomine  Hcl]     Could not tolerate nausea, reduced concentration, h/a    Ciprofloxacin  Other (See Comments)    High blood pressure     Codeine Nausea And Vomiting   Ezetimibe-Simvastatin Other (See Comments)    Muscle aches, nausea, back pain    Lovastatin Other (See Comments)    Myalgias   Macrobid  [Nitrofurantoin  Macrocrystal]     Severe Itching, rash    Protonix  [Pantoprazole  Sodium]     Esophageal problems  Wants removed from list   Rosiglitazone Maleate Other (See Comments)    Edema   Statins     Muscle and joint aches to all statins   Victoza [Liraglutide]     Nausea    Alendronate Sodium Rash    Dysphagia and ulceration    Bactrim [Sulfamethoxazole-Trimethoprim] Rash    itching     Home Medications  Prior to Admission medications   Medication Sig Start Date End Date Taking? Authorizing Provider  acetaminophen  (TYLENOL ) 500 MG tablet Take 1,000 mg by mouth every 6 (six) hours as needed.   Yes [provider]  alum & mag hydroxide-simeth (MAALOX/MYLANTA) 200-200-20 MG/5ML suspension Take 30 mLs by mouth every 4 (four) hours as needed for indigestion. 10/20/20  Yes Fausto Sor A, DO  apixaban  (ELIQUIS ) 5 MG TABS tablet Take 5 mg by mouth 2 (two) times daily.   Yes [provider]  Ascorbic Acid  (VITAMIN C ) 1000 MG tablet Take 2,000 mg by mouth daily.   Yes [provider]  bisoprolol  (ZEBETA ) 5 MG tablet Take 5 mg by mouth in the morning and at bedtime.   Yes [provider]  CALCIUM  ANTACID EXTRA STRENGTH 750 MG chewable tablet Chew by mouth. 08/22/21  Yes [provider]  diclofenac Sodium (VOLTAREN) 1 % GEL Apply 2 g topically in the morning, at noon, and at bedtime.   Yes [provider]  estradiol  (ESTRACE ) 0.1 MG/GM vaginal cream APPLY 0.5MG  (PEA SIZED AMOUNT) JUST INSIDE THE VAGINA WITH FINGER-TIP ON MONDAY, WEDNESDAY AND FRIDAY NIGHTS. 07/21/20  Yes McGowan, Clotilda LABOR, PA-C  famotidine  (PEPCID ) 20 MG tablet Take 20 mg by mouth at bedtime. 12/29/21  Yes [provider]  fluticasone  (FLONASE ) 50 MCG/ACT nasal spray Place 1 spray into both nostrils daily.   Yes [provider]  folic acid  (FOLVITE ) 1 MG tablet Take 1 tablet (1 mg total) by mouth daily. Patient taking differently: Take 1 mg by mouth every other day. 10/21/20  Yes Fausto Sor A, DO  furosemide  (LASIX ) 20 MG tablet Take 1 tablet (20 mg total) by mouth 2 (two) times daily. 10/05/20  Yes Gollan, Timothy J, MD  hydrOXYzine  (ATARAX ) 25 MG tablet Take 25 mg by mouth every 8 (eight) hours as needed for anxiety. 04/25/23 05/09/23 Yes [provider]  insulin  aspart (NOVOLOG ) 100 UNIT/ML injection Inject 0-15 Units into the skin 4 (four) times daily -  before meals and at bedtime. Patient taking differently: Inject 0-15 Units into the skin 4 (four) times daily. 200-249=2u, 250-299=4u, 300-349=6u, 350-399=8u, 400-449=10u, 450-499=12u, if 500 or greater=call MD 10/20/20  Yes Fausto Sor A, DO  insulin  glargine (LANTUS ) 100 UNIT/ML injection Inject 0.1 mLs (10 Units total) into the skin daily. Patient taking differently: Inject 8 Units into the skin daily. 10/21/20  Yes Fausto Sor A, DO  levothyroxine  (SYNTHROID , LEVOTHROID) 50 MCG tablet  Take 50 mcg by mouth daily before breakfast.   Yes [provider]  magnesium  hydroxide (MILK OF MAGNESIA) 400 MG/5ML suspension Take 30 mLs by mouth daily as needed for mild constipation. 10/20/20  Yes Fausto Sor A, DO  Magnesium  Oxide 250 MG TABS Take 250 mg by mouth daily.   Yes [provider]  Multiple Vitamins-Minerals (HEALTHY EYES SUPERVISION 2) CAPS Take 1 capsule by mouth 2 (two) times daily.   Yes [provider]  Multiple Vitamins-Minerals (MULTIVITAMIN GUMMIES ADULT PO) Take 1 each by mouth daily.   Yes [provider]  nystatin powder Apply 1 Application topically 3 (three) times daily as needed.   Yes [provider]  Olopatadine  HCl 0.2 % SOLN Place 1 drop into both eyes daily as needed (Allergies).   Yes [provider]  Omega-3 Fatty Acids (FISH OIL) 1000 MG CAPS Take 1 capsule by mouth 2 (two) times daily.   Yes [provider]  ondansetron  (ZOFRAN ) 4 MG tablet Take 1 tablet (4 mg total) by mouth every 6 (six) hours as needed for nausea. 10/20/20  Yes Fausto Sor LABOR, DO  pyridOXINE  (B-6) 50 MG tablet Take 1 tablet (50 mg total) by mouth daily. 10/21/20  Yes Fausto Sor A, DO  TRADJENTA  5 MG TABS tablet Take 5 mg by mouth daily. 10/05/20  Yes [provider]  vitamin B-12 (CYANOCOBALAMIN ) 1000 MCG tablet Take 1,000 mcg by mouth daily. W/ Folate and B6 sublingual   Yes [provider]  aspirin  325 MG tablet Take 325 mg by mouth daily. Patient not taking: Reported on 04/25/2023 12/29/21   [provider]  bacitracin 500 UNIT/GM ointment Apply 1 Application topically daily. Patient not taking: Reported on 12/21/2022    [provider]  docusate sodium  (COLACE) 100 MG capsule Take 100 mg by mouth daily as needed for mild constipation. Patient not taking: Reported on 12/21/2022    [provider]  glucose blood (ONE TOUCH ULTRA TEST) test strip Use to test blood sugar once  daily E11.49 06/24/15   Bedsole, Amy E, MD  insulin  glargine-yfgn (SEMGLEE ) 100 UNIT/ML Pen Inject into the skin. Patient not taking: Reported on 08/25/2021 02/25/21   [provider]  naproxen  sodium (ALEVE ) 220 MG tablet Take 220 mg by mouth daily as needed. Patient not taking: Reported on 04/25/2023    [provider]  psyllium (METAMUCIL) 58.6 % packet Take 1 packet by mouth daily as needed (constipation). Patient not taking: Reported on 04/25/2023    [provider]  rivaroxaban  (XARELTO ) 20 MG TABS tablet Take 1 tablet (20 mg total) by mouth daily with supper. Patient not taking: Reported on 12/21/2022 01/04/22   Abigail Bernardino HERO, PA-C  simethicone (MYLICON) 80 MG chewable tablet Chew 80 mg by mouth every 6 (six) hours as needed for flatulence. Patient not taking: Reported on 04/25/2023    [provider]  Skin Protectants, Misc. (DIMETHICONE-ZINC OXIDE) cream Apply topically 2 (two) times daily as needed for dry skin. Please label the bottle for the vaginal area only. Patient not taking: Reported on 04/25/2023 06/29/20   Helon Kirsch A, PA-C  SODIUM FLUORIDE 5000 PPM 1.1 % GEL dental gel Take by mouth. Patient not taking: Reported on 04/25/2023 05/10/21   [provider]     Critical care provider statement:   Total critical care time: 33 minutes   Performed by: Parris MD   Critical care time was exclusive of separately billable procedures and treating other patients.   Critical care was necessary to treat or prevent imminent or life-threatening deterioration.   Critical care was time spent personally by me on the following activities: development of treatment plan with patient and/or surrogate as well as nursing, discussions with consultants, evaluation of patient's response to treatment, examination of patient, obtaining history from patient or surrogate, ordering and performing treatments and interventions, ordering and review of laboratory studies,  ordering and review of radiographic studies, pulse oximetry and re-evaluation of patient's condition.    Maurisio Ruddy, M.D.  Pulmonary & Critical Care Medicine

## 2023-04-27 NOTE — Progress Notes (Signed)
 Patient transported to CT scan via transport with this nurse. No sedation required. Patient tolerated well.

## 2023-04-27 NOTE — Plan of Care (Signed)
   Problem: Education: Goal: Knowledge of General Education information will improve Description Including pain rating scale, medication(s)/side effects and non-pharmacologic comfort measures Outcome: Progressing   Problem: Health Behavior/Discharge Planning: Goal: Ability to manage health-related needs will improve Outcome: Progressing

## 2023-04-27 NOTE — Progress Notes (Signed)
 Urology Inpatient Progress Note  Subjective: She remains in the ICU on pressors. Lactate downtrending, 3.8. Procalcitonin up, 53.96. Creatinine down, 2.11. WBC count up, 59.6. Hgb stable, 12.4. Blood cultures growing ESBL E. coli.  Urine culture growing E. coli.  On antibiotics as below. Foley catheter in place draining pink-tinged urine with small nonobstructing clots in the tubing. She reports some abdominal discomfort.  She is rather confused, but conversant.  Anti-infectives: Anti-infectives (From admission, onward)    Start     Dose/Rate Route Frequency Ordered Stop   04/26/23 0800  meropenem  (MERREM ) 1 g in sodium chloride  0.9 % 100 mL IVPB        1 g 200 mL/hr over 30 Minutes Intravenous Every 12 hours 04/26/23 0339     04/25/23 2300  meropenem  (MERREM ) 1 g in sodium chloride  0.9 % 100 mL IVPB  Status:  Discontinued        1 g 200 mL/hr over 30 Minutes Intravenous  Once 04/25/23 2250 04/26/23 0340   04/25/23 1900  meropenem  (MERREM ) 1 g in sodium chloride  0.9 % 100 mL IVPB        1 g 200 mL/hr over 30 Minutes Intravenous  Once 04/25/23 1854 04/25/23 2134       Current Facility-Administered Medications  Medication Dose Route Frequency Provider Last Rate Last Admin   acetaminophen  (TYLENOL ) tablet 650 mg  650 mg Oral Q6H PRN Patsy Lenis, MD       Or   acetaminophen  (TYLENOL ) suppository 650 mg  650 mg Rectal Q6H PRN Patsy Lenis, MD       Chlorhexidine  Gluconate Cloth 2 % PADS 6 each  6 each Topical Daily Patsy Lenis, MD   6 each at 04/27/23 1000   enoxaparin  (LOVENOX ) injection 30 mg  30 mg Subcutaneous Q24H Patsy Lenis, MD   30 mg at 04/27/23 1102   hydrocortisone  sodium succinate  (SOLU-CORTEF ) 100 MG injection 100 mg  100 mg Intravenous Q12H Keene, Jeremiah D, NP   100 mg at 04/27/23 1102   insulin  aspart (novoLOG ) injection 0-15 Units  0-15 Units Subcutaneous Q4H Patsy Lenis, MD   3 Units at 04/27/23 1133   magnesium  hydroxide (MILK OF MAGNESIA) suspension 30  mL  30 mL Oral Daily PRN Patsy Lenis, MD       meropenem  (MERREM ) 1 g in sodium chloride  0.9 % 100 mL IVPB  1 g Intravenous Q12H Girguis, David, MD 200 mL/hr at 04/27/23 0815 1 g at 04/27/23 0815   mupirocin  ointment (BACTROBAN ) 2 %   Nasal BID Aleskerov, Fuad, MD   Given at 04/27/23 1128   norepinephrine  (LEVOPHED ) 16 mg in 250mL (0.064 mg/mL) premix infusion  0-40 mcg/min Intravenous Titrated Shellia Mann D, NP 15 mL/hr at 04/27/23 0817 16 mcg/min at 04/27/23 9182   ondansetron  (ZOFRAN ) tablet 4 mg  4 mg Oral Q6H PRN Patsy Lenis, MD       Or   ondansetron  (ZOFRAN ) injection 4 mg  4 mg Intravenous Q6H PRN Patsy Lenis, MD       sodium bicarbonate  150 mEq in sterile water  1,150 mL infusion   Intravenous Continuous Rust-Chester, Britton L, NP 100 mL/hr at 04/27/23 0656 Infusion Verify at 04/27/23 0656   traZODone  (DESYREL ) tablet 25 mg  25 mg Oral QHS PRN Patsy Lenis, MD   25 mg at 04/27/23 0127   vasopressin  (PITRESSIN) 20 Units in 100 mL (0.2 unit/mL) infusion-*FOR SHOCK*  0-0.03 Units/min Intravenous Continuous Shellia Mann D, NP 9 mL/hr at 04/27/23 1109 0.03  Units/min at 04/27/23 1109   Objective: Vital signs in last 24 hours: Temp:  [98.2 F (36.8 C)-99.9 F (37.7 C)] 98.9 F (37.2 C) (01/03 0800) Pulse Rate:  [30-112] 98 (01/03 0815) Resp:  [13-31] 20 (01/03 0815) BP: (69-129)/(48-84) 129/84 (01/02 1630) SpO2:  [87 %-100 %] 89 % (01/03 0815) Arterial Line BP: (68-139)/(37-69) 126/59 (01/03 0815)  Intake/Output from previous day: 01/02 0701 - 01/03 0700 In: 1840.5 [I.V.:1015.7; IV Piggyback:824.8] Out: 400 [Urine:400] Intake/Output this shift: Total I/O In: -  Out: 330 [Urine:330]  Physical Exam Vitals and nursing note reviewed.  Constitutional:      General: She is not in acute distress.    Appearance: She is ill-appearing. She is not toxic-appearing or diaphoretic.  HENT:     Head: Normocephalic and atraumatic.  Pulmonary:     Effort: Pulmonary effort is  normal. No respiratory distress.  Skin:    General: Skin is warm and dry.  Neurological:     Mental Status: She is alert and oriented to person, place, and time.  Psychiatric:        Mood and Affect: Mood normal.        Behavior: Behavior normal.    Lab Results:  Recent Labs    04/26/23 1258 04/27/23 0350  WBC 27.9* 59.6*  HGB 11.2* 12.4  HCT 32.8* 35.9*  PLT 93* 116*   BMET Recent Labs    04/26/23 2028 04/27/23 0350  NA 132* 132*  K 3.7 3.9  CL 99 98  CO2 14* 19*  GLUCOSE 224* 177*  BUN 35* 39*  CREATININE 2.23* 2.11*  CALCIUM  7.8* 7.5*   PT/INR Recent Labs    04/26/23 0506  LABPROT 27.8*  INR 2.6*   ABG Recent Labs    04/26/23 1252 04/26/23 2028  PHART 7.45 7.39  HCO3 17.4* 14.5*    Studies/Results: DG OR UROLOGY CYSTO IMAGE ([facility] ONLY) Result Date: 04/25/2023 There is no interpretation for this exam.  This order is for images obtained during a surgical procedure.  Please See Surgeries Tab for more information regarding the procedure.   DG OR UROLOGY CYSTO IMAGE ([facility] ONLY) Result Date: 04/25/2023 There is no interpretation for this exam.  This order is for images obtained during a surgical procedure.  Please See Surgeries Tab for more information regarding the procedure.   DG OR UROLOGY CYSTO IMAGE ([facility] ONLY) Result Date: 04/25/2023 There is no interpretation for this exam.  This order is for images obtained during a surgical procedure.  Please See Surgeries Tab for more information regarding the procedure.   DG OR UROLOGY CYSTO IMAGE ([facility] ONLY) Result Date: 04/25/2023 There is no interpretation for this exam.  This order is for images obtained during a surgical procedure.  Please See Surgeries Tab for more information regarding the procedure.   DG OR UROLOGY CYSTO IMAGE ([facility] ONLY) Result Date: 04/25/2023 There is no interpretation for this exam.  This order is for images obtained during a surgical procedure.  Please See Surgeries Tab for  more information regarding the procedure.   CT Renal Stone Study Result Date: 04/25/2023 CLINICAL DATA:  Abdominal/flank pain, stone suspected EXAM: CT ABDOMEN AND PELVIS WITHOUT CONTRAST TECHNIQUE: Multidetector CT imaging of the abdomen and pelvis was performed following the standard protocol without IV contrast. RADIATION DOSE REDUCTION: This exam was performed according to the departmental dose-optimization program which includes automated exposure control, adjustment of the mA and/or kV according to patient size and/or use of iterative reconstruction technique. COMPARISON:  CT abdomen pelvis 01/21/2018 FINDINGS: Lower chest: Small hiatal hernia. Cardiomegaly. Coronary artery calcification. Hepatobiliary: No focal liver abnormality. Status post cholecystectomy. No biliary dilatation. Pancreas: Diffusely atrophic. No focal lesion. Otherwise normal pancreatic contour. No surrounding inflammatory changes. No main pancreatic ductal dilatation. Spleen: Normal in size without focal abnormality. Adrenals/Urinary Tract: No adrenal nodule bilaterally. Right mild hydroureteronephrosis with 2 mm calcified stone within the mid to distal right ureter (2:51 6:47). No right nephrolithiasis. Punctate left nephrolithiasis. No left ureterolithiasis. No left hydronephrosis. The urinary bladder is unremarkable. Stomach/Bowel: Stomach is within normal limits. No evidence of bowel wall thickening or dilatation. Colonic diverticulosis. The appendix is not definitely identified with no inflammatory changes in the right lower quadrant to suggest acute appendicitis. Vascular/Lymphatic: No abdominal aorta or iliac aneurysm. Severe atherosclerotic plaque of the aorta and its branches. No abdominal, pelvic, or inguinal lymphadenopathy. Reproductive: Status post hysterectomy. No adnexal masses. Other: Chronic periaortic retroperitoneal surgical hardware versus retained radiopaque foreign bodies. No intraperitoneal free fluid. No  intraperitoneal free gas. No organized fluid collection. Musculoskeletal: No abdominal wall hernia or abnormality. Diffusely decreased bone density. No suspicious lytic or blastic osseous lesions. No acute displaced fracture. Multilevel severe degenerative changes of the spine. T12, L1, L4 compression fracture status post kyphoplasty. Chronic vertebral height loss at the L5 level. Partially visualized intramedullary nail fixation of the left femur. IMPRESSION: 1. Obstructive 2 mm mid to distal right ureterolithiasis. 2. Nonobstructive punctate left nephrolithiasis. 3. Small hiatal hernia. 4.  Aortic Atherosclerosis (ICD10-I70.0). Electronically Signed   By: Morgane  Naveau M.D.   On: 04/25/2023 19:28   DG Chest Port 1 View Result Date: 04/25/2023 CLINICAL DATA:  Fever EXAM: PORTABLE CHEST 1 VIEW COMPARISON:  01/12/2022 FINDINGS: Single frontal view of the chest demonstrates a stable cardiac silhouette. Areas of linear consolidation are seen at the left lung base, favor scarring or atelectasis. No acute airspace disease, effusion, or pneumothorax. No acute bony abnormalities. Stable postsurgical changes of the left shoulder and thoracolumbar spine. IMPRESSION: 1. Linear consolidation within the left lung base, favor scarring or atelectasis over pneumonia. 2. Otherwise stable exam. Electronically Signed   By: Ozell Daring M.D.   On: 04/25/2023 19:14   CT HEAD WO CONTRAST ( ) Result Date: 04/25/2023 CLINICAL DATA:  Headache, new onset (Age >= 51y) EXAM: CT HEAD WITHOUT CONTRAST TECHNIQUE: Contiguous axial images were obtained from the base of the skull through the vertex without intravenous contrast. RADIATION DOSE REDUCTION: This exam was performed according to the departmental dose-optimization program which includes automated exposure control, adjustment of the mA and/or kV according to patient size and/or use of iterative reconstruction technique. COMPARISON:  Head 01/12/2022. CT FINDINGS: Brain: No  evidence of acute infarction, hemorrhage, hydrocephalus, extra-axial collection or mass lesion/mass effect. Cerebral atrophy. Vascular: No hyperdense vessel.  Calcific atherosclerosis. Skull: No acute fracture. Sinuses/Orbits: Clear sinuses.  No acute orbital findings. Other: No mastoid effusions. IMPRESSION: No evidence of acute intracranial abnormality. Electronically Signed   By: Gilmore GORMAN Molt M.D.   On: 04/25/2023 19:11   Assessment & Plan: 89 year old female s/p right ureteral stent placement with Dr. Selma for management of ESBL E. coli urosepsis secondary to a 2 mm distal right ureteral stone.  She remains critically ill in the ICU this morning.  Prognosis is guarded.  Gross hematuria is anticipated in the setting of ureteral stent, though her blood counts remain stable.  Okay for DVT prophylaxis per primary team, though would continue to monitor her blood counts closely.  Continue antibiotics per  primary team and follow cultures.  If she recovers from her current illness, we discussed that she will require follow-up ureteroscopy for definitive stone management in about 3 weeks.  Bridgett Hattabaugh, PA-C 04/27/2023

## 2023-04-27 NOTE — Progress Notes (Signed)
*  PRELIMINARY RESULTS* Echocardiogram 2D Echocardiogram has been performed.  Jodi Reeves 04/27/2023, 11:53 AM

## 2023-04-27 NOTE — NC FL2 (Signed)
 [place]  MEDICAID FL2 LEVEL OF CARE FORM     IDENTIFICATION  Patient Name: Jodi Reeves Birthdate: [birth year] Sex: female Admission Date (Current Location): 04/25/2023  [facility] and [place] Number:  Chiropodist and Address:  [facility], [address] [place], [place] [address]      Provider Number: [id number]  Attending Physician Name and Address:  Parris Manna, MD  Relative Name and Phone Number:  Jameila, Keeny)  [phone] [facility])    Current Level of Care: Hospital Recommended Level of Care: Skilled Nursing Facility Prior Approval Number:    Date Approved/Denied:   PASRR Number: [id number]  Discharge Plan:      Current Diagnoses: Patient Active Problem List   Diagnosis Date Noted   Septic shock (HCC) 04/26/2023   Type 2 diabetes mellitus with peripheral neuropathy (HCC) 04/26/2023   GERD without esophagitis 04/26/2023   Paroxysmal atrial fibrillation (HCC) 04/26/2023   AKI (acute kidney injury) (HCC) 04/26/2023   Hypokalemia 04/26/2023   E coli bacteremia 04/26/2023   Acute unilateral obstructive uropathy 04/25/2023   Closed intertrochanteric fracture of hip, left, initial encounter (HCC)    Acute blood loss anemia 10/11/2020   Thrombocytopenia (HCC) 10/11/2020   Closed left hip fracture (HCC) 10/10/2020   UTI (urinary tract infection) 04/10/2020   Abdominal pain 04/09/2020   Hypertension associated with diabetes (HCC) 03/15/2020   Obesity (BMI 30-39.9) 03/15/2020   Coronary artery disease involving native coronary artery of native heart without angina pectoris 03/15/2020   PAD (peripheral artery disease) (HCC) 08/14/2019   Foot ulcer (HCC) 06/19/2019   Lymphedema 06/09/2019   Chronic venous insufficiency 06/09/2019   Diabetic retinopathy associated with type 2 diabetes mellitus (HCC) 01/20/2019   Low back pain 09/24/2018   Overactive bladder 04/10/2018   History of UTI 04/10/2018   Hematuria 04/10/2018    Diarrhea 04/10/2018   Venous ulcer of leg (HCC) 04/10/2018   Status post endoscopic carpal tunnel release 03/19/2018   History of skin cancer 11/08/2017   Chronic anticoagulation 06/11/2017   Aortic atherosclerosis (HCC) 05/08/2017   Coronary artery disease, non-occlusive 03/29/2017   Persistent atrial fibrillation (HCC) 03/29/2017   Carpal tunnel syndrome 03/28/2017   Cervical radiculitis 03/28/2017   Problems with swallowing and mastication    Esophageal candidiasis (HCC)    Stricture and stenosis of esophagus 05/02/2016   TIA (transient ischemic attack) 06/24/2015   Status post reverse total shoulder replacement, left 10/16/2014   Humeral head fracture 10/06/2014   Chest pain 03/27/2014   Shortness of breath 03/27/2014   Chronic diastolic CHF (congestive heart failure) (HCC) 03/27/2014   Advanced directives, counseling/discussion 02/24/2014   Routine general medical examination at a health care facility 02/17/2013   Diabetes, polyneuropathy (HCC) 02/17/2013   Type 2 diabetes mellitus with diabetic polyneuropathy, without long-term current use of insulin  (HCC) 02/17/2013   Gait instability 04/11/2012   Edema 11/14/2010   OTHER CONSTIPATION 05/31/2007   Ventral hernia 03/15/2007   Ovarian cancer (HCC) 11/20/2006   Hypothyroidism 11/20/2006   Type 2 diabetes mellitus with neurological manifestations, controlled (HCC) 11/20/2006   Essential hypertension 11/20/2006   Permanent atrial fibrillation (HCC) 11/20/2006   Osteoporosis 11/20/2006   Hyperlipidemia due to type 2 diabetes mellitus (HCC) 11/20/2006    Orientation RESPIRATION BLADDER Height & Weight     Self  Normal Indwelling catheter Weight: 174 lb 6.1 oz (79.1 kg) Height:  5' 4 (162.6 cm)  BEHAVIORAL SYMPTOMS/MOOD NEUROLOGICAL BOWEL NUTRITION STATUS  Diet (carb modified)  AMBULATORY STATUS COMMUNICATION OF NEEDS Skin   Extensive Assist Verbally (delayed responses) Surgical wounds (vagina)                        Personal Care Assistance Level of Assistance  Bathing, Feeding, Dressing Bathing Assistance: Maximum assistance Feeding assistance: Maximum assistance Dressing Assistance: Maximum assistance     Functional Limitations Info             SPECIAL CARE FACTORS FREQUENCY                       Contractures      Additional Factors Info  Code Status, Allergies Code Status Info: Do not attempt resuscitation (DNR) -DNR-LIMITED -Do Not Intubate/DNI Allergies Info: Amiodarone , Fosamax (Alendronate), Penicillins, Sulfa Antibiotics, Actos (Pioglitazone), Amaryl (Glimepiride), Atorvastatin, Bentyl  (Dicyclomine  Hcl), Ciprofloxacin , Codeine, Ezetimibe-simvastatin, Lovastatin, Macrobid  (Nitrofurantoin  Macrocrystal), Protonix  (Pantoprazole  Sodium), Rosiglitazone Maleate, Statins, Victoza (Liraglutide), Alendronate Sodium, Bactrim (Sulfamethoxazole-trimethoprim)           Current Medications (04/27/2023):  This is the current hospital active medication list Current Facility-Administered Medications  Medication Dose Route Frequency Provider Last Rate Last Admin   acetaminophen  (TYLENOL ) tablet 650 mg  650 mg Oral Q6H PRN Patsy Lenis, MD       Or   acetaminophen  (TYLENOL ) suppository 650 mg  650 mg Rectal Q6H PRN Patsy Lenis, MD       Chlorhexidine  Gluconate Cloth 2 % PADS 6 each  6 each Topical Daily Patsy Lenis, MD   6 each at 04/27/23 1000   enoxaparin  (LOVENOX ) injection 30 mg  30 mg Subcutaneous Q24H Patsy Lenis, MD   30 mg at 04/27/23 1102   hydrocortisone  sodium succinate  (SOLU-CORTEF ) 100 MG injection 100 mg  100 mg Intravenous Q12H Keene, Jeremiah D, NP   100 mg at 04/27/23 1102   insulin  aspart (novoLOG ) injection 0-15 Units  0-15 Units Subcutaneous Q4H Patsy Lenis, MD   3 Units at 04/27/23 1133   magnesium  hydroxide (MILK OF MAGNESIA) suspension 30 mL  30 mL Oral Daily PRN Patsy Lenis, MD       meropenem  (MERREM ) 1 g in sodium chloride  0.9 % 100 mL IVPB  1 g  Intravenous Q12H Girguis, David, MD 200 mL/hr at 04/27/23 0815 1 g at 04/27/23 0815   mupirocin  ointment (BACTROBAN ) 2 %   Nasal BID Parris Manna, MD   Given at 04/27/23 1128   norepinephrine  (LEVOPHED ) 16 mg in (0.064 mg/mL) premix infusion  0-40 mcg/min Intravenous Titrated Shellia Mann D, NP 15 mL/hr at 04/27/23 0817 16 mcg/min at 04/27/23 0817   ondansetron  (ZOFRAN ) tablet 4 mg  4 mg Oral Q6H PRN Patsy Lenis, MD       Or   ondansetron  (ZOFRAN ) injection 4 mg  4 mg Intravenous Q6H PRN Patsy Lenis, MD       sodium bicarbonate  150 mEq in sterile water  1,150 mL infusion   Intravenous Continuous Rust-Chester, Jenita CROME, NP 100 mL/hr at 04/27/23 0656 Infusion Verify at 04/27/23 0656   traZODone  (DESYREL ) tablet 25 mg  25 mg Oral QHS PRN Patsy Lenis, MD   25 mg at 04/27/23 0127   vasopressin  (PITRESSIN) 20 Units in 100 mL (0.2 unit/mL) infusion-*FOR SHOCK*  0-0.03 Units/min Intravenous Continuous Shellia Mann D, NP 9 mL/hr at 04/27/23 1109 0.03 Units/min at 04/27/23 1109     Discharge Medications: Please see discharge summary for a list of discharge medications.  Relevant Imaging Results:  Relevant Lab Results:   Additional Information SS #: [id number]  Tylasia Fletchall E Donelda Mailhot, LCSW

## 2023-04-28 ENCOUNTER — Inpatient Hospital Stay: Payer: Medicare HMO

## 2023-04-28 LAB — CBC WITH DIFFERENTIAL/PLATELET
Abs Immature Granulocytes: 3.68 10*3/uL — ABNORMAL HIGH (ref 0.00–0.07)
Basophils Absolute: 0 10*3/uL (ref 0.0–0.1)
Basophils Relative: 0 %
Eosinophils Absolute: 0.2 10*3/uL (ref 0.0–0.5)
Eosinophils Relative: 1 %
HCT: 32 % — ABNORMAL LOW (ref 36.0–46.0)
Hemoglobin: 11.3 g/dL — ABNORMAL LOW (ref 12.0–15.0)
Immature Granulocytes: 8 %
Lymphocytes Relative: 2 %
Lymphs Abs: 0.7 10*3/uL (ref 0.7–4.0)
MCH: 34.6 pg — ABNORMAL HIGH (ref 26.0–34.0)
MCHC: 35.3 g/dL (ref 30.0–36.0)
MCV: 97.9 fL (ref 80.0–100.0)
Monocytes Absolute: 1 10*3/uL (ref 0.1–1.0)
Monocytes Relative: 2 %
Neutro Abs: 38.3 10*3/uL — ABNORMAL HIGH (ref 1.7–7.7)
Neutrophils Relative %: 87 %
Platelets: 84 10*3/uL — ABNORMAL LOW (ref 150–400)
RBC: 3.27 MIL/uL — ABNORMAL LOW (ref 3.87–5.11)
RDW: 13.5 % (ref 11.5–15.5)
Smear Review: NORMAL
WBC: 43.9 10*3/uL — ABNORMAL HIGH (ref 4.0–10.5)
nRBC: 0 % (ref 0.0–0.2)

## 2023-04-28 LAB — COMPREHENSIVE METABOLIC PANEL
ALT: 18 U/L (ref 0–44)
AST: 82 U/L — ABNORMAL HIGH (ref 15–41)
Albumin: 2.1 g/dL — ABNORMAL LOW (ref 3.5–5.0)
Alkaline Phosphatase: 124 U/L (ref 38–126)
Anion gap: 15 (ref 5–15)
BUN: 53 mg/dL — ABNORMAL HIGH (ref 8–23)
CO2: 20 mmol/L — ABNORMAL LOW (ref 22–32)
Calcium: 7.4 mg/dL — ABNORMAL LOW (ref 8.9–10.3)
Chloride: 98 mmol/L (ref 98–111)
Creatinine, Ser: 1.48 mg/dL — ABNORMAL HIGH (ref 0.44–1.00)
GFR, Estimated: 33 mL/min — ABNORMAL LOW (ref >60)
Glucose, Bld: 161 mg/dL — ABNORMAL HIGH (ref 70–99)
Potassium: 4 mmol/L (ref 3.5–5.1)
Sodium: 133 mmol/L — ABNORMAL LOW (ref 135–145)
Total Bilirubin: 1.1 mg/dL (ref 0.0–1.2)
Total Protein: 5.6 g/dL — ABNORMAL LOW (ref 6.5–8.1)

## 2023-04-28 LAB — CULTURE, BLOOD (ROUTINE X 2): Special Requests: ADEQUATE

## 2023-04-28 LAB — URINE CULTURE: Culture: 70000 — AB

## 2023-04-28 LAB — GLUCOSE, CAPILLARY
Glucose-Capillary: 144 mg/dL — ABNORMAL HIGH (ref 70–99)
Glucose-Capillary: 165 mg/dL — ABNORMAL HIGH (ref 70–99)
Glucose-Capillary: 166 mg/dL — ABNORMAL HIGH (ref 70–99)
Glucose-Capillary: 172 mg/dL — ABNORMAL HIGH (ref 70–99)
Glucose-Capillary: 191 mg/dL — ABNORMAL HIGH (ref 70–99)
Glucose-Capillary: 218 mg/dL — ABNORMAL HIGH (ref 70–99)

## 2023-04-28 LAB — MAGNESIUM: Magnesium: 1.8 mg/dL (ref 1.7–2.4)

## 2023-04-28 LAB — LACTIC ACID, PLASMA: Lactic Acid, Venous: 3.4 mmol/L (ref 0.5–1.9)

## 2023-04-28 LAB — PROCALCITONIN: Procalcitonin: 32.98 ng/mL

## 2023-04-28 MED ORDER — ORAL CARE MOUTH RINSE: 15.0000 mL | OROMUCOSAL | Status: DC | PRN

## 2023-04-28 MED ORDER — MAGNESIUM SULFATE 2 GM/50ML IV SOLN
2.0000 g | Freq: Once | INTRAVENOUS | Status: AC
  Administered 2023-04-28: 2 g via INTRAVENOUS
  Filled 2023-04-28: qty 50

## 2023-04-28 NOTE — Plan of Care (Signed)
   Problem: Clinical Measurements: Goal: Diagnostic test results will improve Outcome: Not Progressing

## 2023-04-28 NOTE — Progress Notes (Signed)
 NAME:  Jodi Reeves, MRN:  [id number], DOB:  12/19/1927, LOS: 3 ADMISSION DATE:  04/25/2023, CONSULTATION DATE:  04/26/2023 REFERRING MD:  Dr. Patsy, CHIEF COMPLAINT:  Septic shock   Brief Pt Description / Synopsis:  89 y.o. female admitted with Acute Metabolic Encephalopathy in setting of Severe Sepsis due to  ESBL E. Coli BACTEREMIA due to Right Ureteral Stone infection, status post Cystoscopy and right ureteral stent placement.  Course complicated by development of septic shock.  History of Present Illness:  Jodi Reeves is a 89 y.o. female with a past medical history significant for anxiety, coronary artery disease, diastolic CHF, GERD, hypertension, dyslipidemia, hypothyroidism, TIA and IBS, who presented to the ER on 04/25/23 with acute onset of dysuria with altered mental status.  Pt is currently altered and unable to contribute to history and no family currently available, therefore history is obtained from chart review.     Pertinent  Medical History   Past Medical History:  Diagnosis Date   Allergy    Anxiety    Balance disorder    Chronic diastolic CHF (congestive heart failure) (HCC)    a. 10/2018 Echo: EF 55-60%, diast dysfxn, nl RV fxn. RVSP 45.81mmHg. Mild-mod TR. Mod dil LA.   Colon polyps    Coronary artery disease, non-occlusive    a. LHC 11/2003: 10% LAD stenosis, 20% LCx stenosis, and 40% RCA stenosis; b. nuc stress test 12/15: small region of mild ischemia in the apical region with WMA also noted in the apical region, EF 60%. 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 CHADS2VASc = > 8 (CHF, HTN, age x 2, DM, TIA x 2, female); b. on Xarelto    SBO (small bowel obstruction) (HCC)    1998   TIA (transient ischemic attack)    UTI (urinary tract infection)    Venous insufficiency    Ventral hernia     Micro Data:  1/1: SARS-CoV-2/RSV /Flu PCR>>negative 1/1: Blood cultures>> ESBL E. Coli 1/1: Urine>>  Antimicrobials:   Anti-infectives (From admission, onward)    Start     Dose/Rate Route Frequency Ordered Stop   04/26/23 0800  meropenem  (MERREM ) 1 g in sodium chloride  0.9 % 100 mL IVPB        1 g 200 mL/hr over 30 Minutes Intravenous Every 12 hours 04/26/23 0339     04/25/23 2300  meropenem  (MERREM ) 1 g in sodium chloride  0.9 % 100 mL IVPB  Status:  Discontinued        1 g 200 mL/hr over 30 Minutes Intravenous  Once 04/25/23 2250 04/26/23 0340   04/25/23 1900  meropenem  (MERREM ) 1 g in sodium chloride  0.9 % 100 mL IVPB  1 g 200 mL/hr over 30 Minutes Intravenous  Once 04/25/23 1854 04/25/23 2134       Significant Hospital Events: Including procedures, antibiotic start and stop dates in addition to other pertinent events   1/1: Presented to ED with AMS, admitted by Hospitalist for sepsis due to infect right ureteral stone.  Urology consulted, performed Right ureteral stent. 1/2: Became hypotensive concerning for developing septic shock.  Transferred to ICU with PCCM consultation for potential vasopressors. 04/27/23 - patient on 15mcg/kg/min today.  She continues to stay critically ill with ESBL bacteremia.  Prognosis is poor due to advanced age with septic shock. 1Her bloodwork shows interval marked increase in WBC count She remains on levophed  and meropenem .  Have ordered pathology slide reivew 04/28/23 - clinically improved today,  eating 1/2 meal.  Still on pressors , wbc count trending down. CT abd/pelvis reassuring   Objective   Blood pressure 129/84, pulse 79, temperature 98.4 F (36.9 C), resp. rate 16, height 5' 4 (1.626 m), weight 79.1 kg, SpO2 92%.        Intake/Output Summary (Last 24 hours) at 04/28/2023 0910 Last data filed at 04/28/2023 0700 Gross per 24 hour  Intake 644.53 ml  Output 800 ml  Net -155.47 ml   Filed Weights   04/26/23 0112 04/26/23 1219  Weight: 78.1 kg 79.1 kg    Examination: General: Acute on chronically ill-appearing elderly female, laying in bed HENT: Atraumatic, normocephalic, neck supple, no JVD Lungs: Clear breath sounds throughout, even, nonlabored, normal effort Cardiovascular: Irregularly irregular rhythm, rate controlled, no murmurs, rubs, gallops Abdomen: Obese, soft, nontender, no guarding or rebound tenderness, bowel sounds positive x 4 Extremities: Generalized weakness, normal bulk and tone, no deformities, trace edema to bilateral lower extremities Neuro: Lethargic, arouses to pain, currently not following commands, pupils PERRLA GU: Foley catheter in place Skin: No FND grossly   Resolved Hospital Problem list     Assessment & Plan:   #Septic Shock-PRESENT ON ADMISSION - DUE TO ESBL BACTEREMIA #Paroxymal Atrial Fibrillation -Continuous cardiac monitoring -Maintain MAP >65 -IV fluids -Vasopressors as needed to maintain MAP goal -Trend lactic acid until normalized -Trend HS Troponin until peaked -Echocardiogram pending -May need to consider starting for Heparin gtt for anticoagulation ~ but will defer to primary service and Urology on timing as pt had ureteral stent placed yesterfday  #Severe Sepsis  #ESBL E. Coli BACTEREMIA  #Right Ureteral Stone infection ~ status post Right Ureteral Stent on 1/1 -Monitor fever curve -Trend WBC's & Procalcitonin -Follow cultures as above -Continue empiric Meropenem  pending cultures & sensitivities -Urology  following, appreciate input  #Acute Kidney Injury #AG Metabolic Acidosis #Mild Hypokalemia #Mild Hyponatremia -Monitor I&O's / urinary output -Follow BMP -Ensure adequate renal perfusion -Avoid nephrotoxic agents as able -Replace electrolytes as indicated ~ Pharmacy following for assistance with electrolyte replacement -IV fluids  #Thrombocytopenia -Monitor for S/Sx of bleeding -Trend CBC -Lovenox  for VTE Prophylaxis  -Transfuse for Hgb <7  #Diabetes Mellitus Type II #Hypothyroidism -CBG's q4h; Target range of 140 to 180 -SSI -Follow ICU Hypo/Hyperglycemia protocol -Continue Synthroid   #Acute Metabolic Encephalopathy -Treatment of sepsis and metabolic derangements as outlined above -Provide supportive care -Promote normal sleep/wake cycle and family presence -Avoid sedating medications as able -Check ABG    Best Practice (right click and Reselect all SmartList Selections daily)   Diet/type: NPO DVT prophylaxis: LMWH GI prophylaxis: N/A Lines: N/A Foley:  Yes, and it is still needed Code Status:  DNR Last date of multidisciplinary goals of care discussion [  N/A]    Labs   CBC: Recent Labs  Lab 04/25/23 1803 04/26/23 0506 04/26/23 1258 04/27/23 0350 04/27/23 1642 04/28/23 0330  WBC 12.2* 9.4 27.9* 59.6* 57.8* 43.9*  NEUTROABS 10.0*  --   --  52.4* 50.1* 38.3*  HGB 12.7 11.1* 11.2* 12.4 11.9* 11.3*  HCT 37.2 32.6* 32.8* 35.9* 34.9* 32.0*  MCV 98.9 101.2* 100.0 99.4 99.4 97.9  PLT 164 97* 93* 116* 101* 84*    Basic Metabolic Panel: Recent Labs  Lab 04/26/23 0506 04/26/23 1258 04/26/23 2028 04/27/23 0350 04/28/23 0330  NA 133* 133* 132* 132* 133*  K 3.3* 3.6 3.7 3.9 4.0  CL 99 98 99 98 98  CO2 19* 18* 14* 19* 20*  GLUCOSE 131* 172* 224* 177* 161*  BUN 25* 28* 35* 39* 53*  CREATININE 1.35* 1.88* 2.23* 2.11* 1.48*  CALCIUM  7.5* 7.6* 7.8* 7.5* 7.4*  MG  --   --   --  1.7 1.8  PHOS  --   --   --  3.4  --    GFR: Estimated Creatinine  Clearance: 24.2 mL/min (A) (by C-G formula based on SCr of 1.48 mg/dL (H)). Recent Labs  Lab 04/26/23 0606 04/26/23 1024 04/26/23 1258 04/26/23 1753 04/27/23 0350 04/27/23 0505 04/27/23 0755 04/27/23 1123 04/27/23 1642 04/27/23 1925 04/28/23 0330  PROCALCITON 21.90  --   --   --  53.96  --   --   --   --   --  32.98  WBC  --   --  27.9*  --  59.6*  --   --   --  57.8*  --  43.9*  LATICACIDVEN  --    < > 5.6*   < >  --    < > 3.9* 3.8* 4.3* 4.0*  --    < > = values in this interval not displayed.    Liver Function Tests: Recent Labs  Lab 04/25/23 1803 04/27/23 0350 04/28/23 0330  AST 43* 137* 82*  ALT 15 27 18   ALKPHOS 99 166* 124  BILITOT 1.0 1.7* 1.1  PROT 7.7 6.2* 5.6*  ALBUMIN 3.1* 2.3* 2.1*   Recent Labs  Lab 04/25/23 1803  LIPASE 28   No results for input(s): AMMONIA in the last 168 hours.  ABG    Component Value Date/Time   PHART 7.39 04/26/2023 2028   PCO2ART 24 (L) 04/26/2023 2028   PO2ART 73 (L) 04/26/2023 2028   HCO3 14.5 (L) 04/26/2023 2028   ACIDBASEDEF 8.6 (H) 04/26/2023 2028   O2SAT 97.8 04/26/2023 2028     Coagulation Profile: Recent Labs  Lab 04/26/23 0506  INR 2.6*    Cardiac Enzymes: No results for input(s): CKTOTAL, CKMB, CKMBINDEX, TROPONINI in the last 168 hours.  HbA1C: Hgb A1c MFr Bld  Date/Time Value Ref Range Status  04/26/2023 05:06 AM 8.0 (H) 4.8 - 5.6 % Final    Comment:    (NOTE)         Prediabetes: 5.7 - 6.4         Diabetes: >6.4         Glycemic control for adults with diabetes: <7.0   10/10/2020 08:10 AM 7.8 (H) 4.8 - 5.6 % Final    Comment:    (NOTE)         Prediabetes: 5.7 - 6.4         Diabetes: >6.4         Glycemic control for adults with diabetes: <7.0  CBG: Recent Labs  Lab 04/27/23 1548 04/27/23 1928 04/27/23 2328 04/28/23 0338 04/28/23 0747  GLUCAP 154* 181* 144* 165* 144*    Review of Systems:   Unable to assess due to AMS   Past Medical History:  She,  has a past  medical history of Allergy, Anxiety, Balance disorder, Chronic diastolic CHF (congestive heart failure) (HCC), Colon polyps, Coronary artery disease, non-occlusive, DM2 (diabetes mellitus, type 2) (HCC), Fall, Falls frequently, GERD (gastroesophageal reflux disease), Granuloma annulare, History of chicken pox, History of eating disorder, HLD (hyperlipidemia), HTN (hypertension), Hypothyroidism, IBS (irritable bowel syndrome), Incontinence of bowel, Osteopenia, Osteoporosis, Ovarian cancer (HCC), Persistent atrial fibrillation (HCC), SBO (small bowel obstruction) (HCC), TIA (transient ischemic attack), UTI (urinary tract infection), Venous insufficiency, and Ventral hernia.   Surgical History:   Past Surgical History:  Procedure Laterality Date   2nd look laparotomy  1982   ABDOMINAL HYSTERECTOMY     for ovarian cancer s/p hysterectomy total 1976 and exp lap 1980   APPENDECTOMY     BACK SURGERY     CARDIAC CATHETERIZATION  8/05   neg; a fib found    CARPAL TUNNEL RELEASE Left 03/06/2018   Procedure: CARPAL TUNNEL RELEASE ENDOSCOPIC;  Surgeon: Edie Norleen PARAS, MD;  Location: [facility];  Service: Orthopedics;  Laterality: Left;  diabetic - oral meds   CARPAL TUNNEL RELEASE     left CTS Dr.Poggi   cataract OD  2002   cataract surgery  5/07   R   CHOLECYSTECTOMY  1986   CYSTOSCOPY WITH RETROGRADE PYELOGRAM, URETEROSCOPY AND STENT PLACEMENT Right 04/25/2023   Procedure: CYSTOSCOPY WITH RETROGRADE PYELOGRAM, URETEROSCOPY AND STENT PLACEMENT;  Surgeon: Selma Donnice SAUNDERS, MD;  Location: [facility];  Service: Urology;  Laterality: Right;   DEXA  9/04 and 1/02   EGD/dilation/colon  1/06   ESOPHAGOGASTRODUODENOSCOPY (EGD) WITH PROPOFOL  N/A 05/16/2016   Procedure: ESOPHAGOGASTRODUODENOSCOPY (EGD) WITH PROPOFOL  with dilation;  Surgeon: Rogelia Copping, MD;  Location: [facility];  Service: Endoscopy;  Laterality: N/A;   FEMORAL HERNIA REPAIR  1958   R   fracture L elbow/wrist  2000   INTRAMEDULLARY  (IM) NAIL INTERTROCHANTERIC Left 10/12/2020   Procedure: INTRAMEDULLARY (IM) NAIL INTERTROCHANTRIC;  Surgeon: Krasinski, Kevin, MD;  Location: [facility];  Service: Orthopedics;  Laterality: Left;   intussception/obstruction  12/98   JOINT REPLACEMENT     left shoulder   kidney stone x2  1991   KYPHOPLASTY N/A 04/13/2020   Procedure: [id number];  Surgeon: Kathlynn Sharper, MD;  Location: [facility];  Service: Orthopedics;  Laterality: N/A;   KYPHOPLASTY N/A 05/18/2020   Procedure: L1 KYPHOPLASTY;  Surgeon: Kathlynn Sharper, MD;  Location: [facility];  Service: Orthopedics;  Laterality: N/A;   KYPHOPLASTY N/A 07/06/2020   Procedure: L4  KYPHOPLASTY;  Surgeon: Kathlynn Sharper, MD;  Location: [facility];  Service: Orthopedics;  Laterality: N/A;   laminectomy L4-5  1971   LUMBAR LAMINECTOMY     1970 ruptured disc    MOUTH SURGERY     myoview  stress  8/05   syncope (-)   REVERSE SHOULDER ARTHROPLASTY Left 10/06/2014   Procedure: REVERSE SHOULDER ARTHROPLASTY;  Surgeon: Norleen PARAS Edie, MD;  Location: [facility];  Service: Orthopedics;  Laterality: Left;   stillbirth  1969   TOTAL ABDOMINAL HYSTERECTOMY W/ BILATERAL SALPINGOOPHORECTOMY  1980   ovarian cancer   WRIST FRACTURE SURGERY  11/05   R     Social History:   reports that she has never smoked. She has never used smokeless  tobacco. She reports that she does not drink alcohol  and does not use drugs.   Family History:  Her family history includes Arthritis in her brother, brother, and sister; COPD in her brother; Cancer in her brother, brother, and brother; Depression in her brother and sister; Diabetes in her brother, mother, and paternal grandfather; Early death in her brother, brother, and father; Hearing loss in her brother, brother, brother, daughter, sister, and sister; Heart disease in her brother, mother, sister, sister, and sister; Hyperlipidemia in her brother; Hypertension in her daughter and sister; Stroke in her sister.   Allergies Allergies   Allergen Reactions   Amiodarone  Other (See Comments)    Severe Thyroid  issues    Fosamax [Alendronate]     dysphagia   Penicillins Anaphylaxis and Other (See Comments)    TOLERATED CEFAZOLIN  07/06/20 Has patient had a PCN reaction causing immediate rash, facial/tongue/throat swelling, SOB or lightheadedness with hypotension: Yes Has patient had a PCN reaction causing severe rash involving mucus membranes or skin necrosis: No Has patient had a PCN reaction that required hospitalization: No Has patient had a PCN reaction occurring within the last 10 years: No If all of the above answers are NO, then may proceed with Cephalosporin use.    Sulfa Antibiotics Itching and Rash    Itching    Actos [Pioglitazone]     Not effective    Amaryl [Glimepiride]     Not effective     Atorvastatin Other (See Comments)    Muscle aches & All statins per Patient   Bentyl  [Dicyclomine  Hcl]     Could not tolerate nausea, reduced concentration, h/a    Ciprofloxacin  Other (See Comments)    High blood pressure     Codeine Nausea And Vomiting   Ezetimibe-Simvastatin Other (See Comments)    Muscle aches, nausea, back pain    Lovastatin Other (See Comments)    Myalgias   Macrobid  [Nitrofurantoin  Macrocrystal]     Severe Itching, rash    Protonix  [Pantoprazole  Sodium]     Esophageal problems  Wants removed from list   Rosiglitazone Maleate Other (See Comments)    Edema   Statins     Muscle and joint aches to all statins   Victoza [Liraglutide]     Nausea    Alendronate Sodium Rash    Dysphagia and ulceration    Bactrim [Sulfamethoxazole-Trimethoprim] Rash    itching     Home Medications  Prior to Admission medications   Medication Sig Start Date End Date Taking? Authorizing Provider  acetaminophen  (TYLENOL ) 500 MG tablet Take 1,000 mg by mouth every 6 (six) hours as needed.   Yes [provider]  alum & mag hydroxide-simeth (MAALOX/MYLANTA) 200-200-20 MG/5ML suspension Take 30  mLs by mouth every 4 (four) hours as needed for indigestion. 10/20/20  Yes Fausto Sor A, DO  apixaban  (ELIQUIS ) 5 MG TABS tablet Take 5 mg by mouth 2 (two) times daily.   Yes [provider]  Ascorbic Acid  (VITAMIN C ) 1000 MG tablet Take 2,000 mg by mouth daily.   Yes [provider]  bisoprolol  (ZEBETA ) 5 MG tablet Take 5 mg by mouth in the morning and at bedtime.   Yes [provider]  CALCIUM  ANTACID EXTRA STRENGTH 750 MG chewable tablet Chew by mouth. 08/22/21  Yes [provider]  diclofenac Sodium (VOLTAREN) 1 % GEL Apply 2 g topically in the morning, at noon, and at bedtime.   Yes [provider]  estradiol  (ESTRACE ) 0.1 MG/GM vaginal  cream APPLY 0.5MG  (PEA SIZED AMOUNT) JUST INSIDE THE VAGINA WITH FINGER-TIP ON MONDAY, WEDNESDAY AND FRIDAY NIGHTS. 07/21/20  Yes McGowan, Clotilda A, PA-C  famotidine  (PEPCID ) 20 MG tablet Take 20 mg by mouth at bedtime. 12/29/21  Yes [provider]  fluticasone  (FLONASE ) 50 MCG/ACT nasal spray Place 1 spray into both nostrils daily.   Yes [provider]  folic acid  (FOLVITE ) 1 MG tablet Take 1 tablet (1 mg total) by mouth daily. Patient taking differently: Take 1 mg by mouth every other day. 10/21/20  Yes Fausto Sor A, DO  furosemide  (LASIX ) 20 MG tablet Take 1 tablet (20 mg total) by mouth 2 (two) times daily. 10/05/20  Yes Gollan, Timothy J, MD  hydrOXYzine  (ATARAX ) 25 MG tablet Take 25 mg by mouth every 8 (eight) hours as needed for anxiety. 04/25/23 05/09/23 Yes [provider]  insulin  aspart (NOVOLOG ) 100 UNIT/ML injection Inject 0-15 Units into the skin 4 (four) times daily -  before meals and at bedtime. Patient taking differently: Inject 0-15 Units into the skin 4 (four) times daily. 200-249=2u, 250-299=4u, 300-349=6u, 350-399=8u, 400-449=10u, 450-499=12u, if 500 or greater=call MD 10/20/20  Yes Fausto Sor A, DO  insulin  glargine (LANTUS ) 100 UNIT/ML injection Inject 0.1 mLs (10  Units total) into the skin daily. Patient taking differently: Inject 8 Units into the skin daily. 10/21/20  Yes Fausto Sor A, DO  levothyroxine  (SYNTHROID , LEVOTHROID) 50 MCG tablet Take 50 mcg by mouth daily before breakfast.   Yes [provider]  magnesium  hydroxide (MILK OF MAGNESIA) 400 MG/5ML suspension Take 30 mLs by mouth daily as needed for mild constipation. 10/20/20  Yes Fausto Sor A, DO  Magnesium  Oxide 250 MG TABS Take 250 mg by mouth daily.   Yes [provider]  Multiple Vitamins-Minerals (HEALTHY EYES SUPERVISION 2) CAPS Take 1 capsule by mouth 2 (two) times daily.   Yes [provider]  Multiple Vitamins-Minerals (MULTIVITAMIN GUMMIES ADULT PO) Take 1 each by mouth daily.   Yes [provider]  nystatin powder Apply 1 Application topically 3 (three) times daily as needed.   Yes [provider]  Olopatadine  HCl 0.2 % SOLN Place 1 drop into both eyes daily as needed (Allergies).   Yes [provider]  Omega-3 Fatty Acids (FISH OIL) 1000 MG CAPS Take 1 capsule by mouth 2 (two) times daily.   Yes [provider]  ondansetron  (ZOFRAN ) 4 MG tablet Take 1 tablet (4 mg total) by mouth every 6 (six) hours as needed for nausea. 10/20/20  Yes Fausto Sor A, DO  pyridOXINE  (B-6) 50 MG tablet Take 1 tablet (50 mg total) by mouth daily. 10/21/20  Yes Fausto Sor A, DO  TRADJENTA  5 MG TABS tablet Take 5 mg by mouth daily. 10/05/20  Yes [provider]  vitamin B-12 (CYANOCOBALAMIN ) 1000 MCG tablet Take 1,000 mcg by mouth daily. W/ Folate and B6 sublingual   Yes [provider]  aspirin  325 MG tablet Take 325 mg by mouth daily. Patient not taking: Reported on 04/25/2023 12/29/21   [provider]  bacitracin 500 UNIT/GM ointment Apply 1 Application topically daily. Patient not taking: Reported on 12/21/2022    [provider]  docusate sodium  (COLACE) 100 MG capsule Take 100 mg by mouth daily  as needed for mild constipation. Patient not taking: Reported on 12/21/2022    [provider]  glucose blood (ONE TOUCH ULTRA TEST) test strip Use to test blood sugar once daily E11.49 06/24/15  Bedsole, Amy E, MD  insulin  glargine-yfgn (SEMGLEE ) 100 UNIT/ML Pen Inject into the skin. Patient not taking: Reported on 08/25/2021 02/25/21   [provider]  naproxen  sodium (ALEVE ) 220 MG tablet Take 220 mg by mouth daily as needed. Patient not taking: Reported on 04/25/2023    [provider]  psyllium (METAMUCIL) 58.6 % packet Take 1 packet by mouth daily as needed (constipation). Patient not taking: Reported on 04/25/2023    [provider]  rivaroxaban  (XARELTO ) 20 MG TABS tablet Take 1 tablet (20 mg total) by mouth daily with supper. Patient not taking: Reported on 12/21/2022 01/04/22   Abigail Bernardino HERO, PA-C  simethicone (MYLICON) 80 MG chewable tablet Chew 80 mg by mouth every 6 (six) hours as needed for flatulence. Patient not taking: Reported on 04/25/2023    [provider]  Skin Protectants, Misc. (DIMETHICONE-ZINC OXIDE) cream Apply topically 2 (two) times daily as needed for dry skin. Please label the bottle for the vaginal area only. Patient not taking: Reported on 04/25/2023 06/29/20   Helon Kirsch A, PA-C  SODIUM FLUORIDE 5000 PPM 1.1 % GEL dental gel Take by mouth. Patient not taking: Reported on 04/25/2023 05/10/21   [provider]     Critical care provider statement:   Total critical care time: 33 minutes   Performed by: Parris MD   Critical care time was exclusive of separately billable procedures and treating other patients.   Critical care was necessary to treat or prevent imminent or life-threatening deterioration.   Critical care was time spent personally by me on the following activities: development of treatment plan with patient and/or surrogate as well as nursing, discussions with consultants, evaluation of patient's response  to treatment, examination of patient, obtaining history from patient or surrogate, ordering and performing treatments and interventions, ordering and review of laboratory studies, ordering and review of radiographic studies, pulse oximetry and re-evaluation of patient's condition.    Anthonette Lesage, M.D.  Pulmonary & Critical Care Medicine

## 2023-04-28 NOTE — Progress Notes (Signed)
 Updated pts son Arianna Haydon at bedside regarding pts condition and current plan of care.  All questions were answered and Mr. Duffey was appreciative to receive an update.  Lonell Moose, AGNP  Pulmonary/Critical Care Pager [phone] (please enter 7 digits) PCCM Consult Pager [phone] (please enter 7 digits)

## 2023-04-29 LAB — COMPREHENSIVE METABOLIC PANEL
ALT: 14 U/L (ref 0–44)
AST: 46 U/L — ABNORMAL HIGH (ref 15–41)
Albumin: 2 g/dL — ABNORMAL LOW (ref 3.5–5.0)
Alkaline Phosphatase: 140 U/L — ABNORMAL HIGH (ref 38–126)
Anion gap: 12 (ref 5–15)
BUN: 65 mg/dL — ABNORMAL HIGH (ref 8–23)
CO2: 25 mmol/L (ref 22–32)
Calcium: 7.7 mg/dL — ABNORMAL LOW (ref 8.9–10.3)
Chloride: 97 mmol/L — ABNORMAL LOW (ref 98–111)
Creatinine, Ser: 1.22 mg/dL — ABNORMAL HIGH (ref 0.44–1.00)
GFR, Estimated: 41 mL/min — ABNORMAL LOW (ref >60)
Glucose, Bld: 293 mg/dL — ABNORMAL HIGH (ref 70–99)
Potassium: 3.6 mmol/L (ref 3.5–5.1)
Sodium: 134 mmol/L — ABNORMAL LOW (ref 135–145)
Total Bilirubin: 0.8 mg/dL (ref 0.0–1.2)
Total Protein: 5.6 g/dL — ABNORMAL LOW (ref 6.5–8.1)

## 2023-04-29 LAB — CBC WITH DIFFERENTIAL/PLATELET
Abs Immature Granulocytes: 0.72 10*3/uL — ABNORMAL HIGH (ref 0.00–0.07)
Basophils Absolute: 0 10*3/uL (ref 0.0–0.1)
Basophils Relative: 0 %
Eosinophils Absolute: 0.1 10*3/uL (ref 0.0–0.5)
Eosinophils Relative: 0 %
HCT: 31.9 % — ABNORMAL LOW (ref 36.0–46.0)
Hemoglobin: 11.1 g/dL — ABNORMAL LOW (ref 12.0–15.0)
Immature Granulocytes: 1 %
Lymphocytes Relative: 2 %
Lymphs Abs: 1.1 10*3/uL (ref 0.7–4.0)
MCH: 33.7 pg (ref 26.0–34.0)
MCHC: 34.8 g/dL (ref 30.0–36.0)
MCV: 97 fL (ref 80.0–100.0)
Monocytes Absolute: 1 10*3/uL (ref 0.1–1.0)
Monocytes Relative: 2 %
Neutro Abs: 48.4 10*3/uL — ABNORMAL HIGH (ref 1.7–7.7)
Neutrophils Relative %: 95 %
Platelets: 91 10*3/uL — ABNORMAL LOW (ref 150–400)
RBC: 3.29 MIL/uL — ABNORMAL LOW (ref 3.87–5.11)
RDW: 13.5 % (ref 11.5–15.5)
Smear Review: UNDETERMINED
WBC: 51.3 10*3/uL (ref 4.0–10.5)
nRBC: 0 % (ref 0.0–0.2)

## 2023-04-29 LAB — GLUCOSE, CAPILLARY
Glucose-Capillary: 300 mg/dL — ABNORMAL HIGH (ref 70–99)
Glucose-Capillary: 201 mg/dL — ABNORMAL HIGH (ref 70–99)
Glucose-Capillary: 179 mg/dL — ABNORMAL HIGH (ref 70–99)
Glucose-Capillary: 161 mg/dL — ABNORMAL HIGH (ref 70–99)
Glucose-Capillary: 127 mg/dL — ABNORMAL HIGH (ref 70–99)

## 2023-04-29 LAB — MAGNESIUM: Magnesium: 2.4 mg/dL (ref 1.7–2.4)

## 2023-04-29 LAB — URINE CULTURE: Culture: >=100000 — AB

## 2023-04-29 LAB — PROCALCITONIN: Procalcitonin: 19.51 ng/mL

## 2023-04-29 LAB — LACTIC ACID, PLASMA: Lactic Acid, Venous: 3.3 mmol/L (ref 0.5–1.9)

## 2023-04-29 MED ORDER — LACTATED RINGERS IV BOLUS
500.0000 mL | Freq: Once | INTRAVENOUS | Status: AC
  Administered 2023-04-29: 500 mL via INTRAVENOUS

## 2023-04-29 MED ORDER — INSULIN GLARGINE-YFGN 100 UNIT/ML ~~LOC~~ SOLN
8.0000 [IU] | Freq: Every day | SUBCUTANEOUS | Status: DC
  Administered 2023-04-29 – 2023-05-07 (×8): 8 [IU] via SUBCUTANEOUS
  Filled 2023-04-29 (×9): qty 0.08

## 2023-04-29 NOTE — Progress Notes (Signed)
 Updated pts son Liberty Seto via telephone regarding pts condition and current plan of care.  All questions answered, Mr. Darnell was appreciative to receive an update.  Will continue to monitor and assess pt.  Lonell Moose, AGNP  Pulmonary/Critical Care Pager [phone] (please enter 7 digits) PCCM Consult Pager [phone] (please enter 7 digits)

## 2023-04-29 NOTE — Progress Notes (Signed)
 NAME:  Jodi Reeves, MRN:  [id number], DOB:  Sep 21, 1927, LOS: 4 ADMISSION DATE:  04/25/2023, CONSULTATION DATE:  04/26/2023 REFERRING MD:  Dr. Patsy, CHIEF COMPLAINT:  Septic shock   Brief Pt Description / Synopsis:  89 y.o. female admitted with Acute Metabolic Encephalopathy in setting of Severe Sepsis due to  ESBL E. Coli BACTEREMIA due to Right Ureteral Stone infection, status post Cystoscopy and right ureteral stent placement.  Course complicated by development of septic shock.  History of Present Illness:  Jodi Reeves is a 89 y.o. female with a past medical history significant for anxiety, coronary artery disease, diastolic CHF, GERD, hypertension, dyslipidemia, hypothyroidism, TIA and IBS, who presented to the ER on 04/25/23 with acute onset of dysuria with altered mental status.  Pt is currently altered and unable to contribute to history and no family currently available, therefore history is obtained from chart review.   04/29/23- patient improved , off levophed , hyperglycemia noted treated with weight based regimen today from home.  Aki improved with over 1L UOP overnight no hematuria now. Mentation still with mild encephalopathy.   Wbc count still markedly elevated and she remains on merem for ESBL bacteremia.    Pertinent  Medical History   Past Medical History:  Diagnosis Date   Allergy    Anxiety    Balance disorder    Chronic diastolic CHF (congestive heart failure) (HCC)    a. 10/2018 Echo: EF 55-60%, diast dysfxn, nl RV fxn. RVSP 45.38mmHg. Mild-mod TR. Mod dil LA.   Colon polyps    Coronary artery disease, non-occlusive    a. LHC 11/2003: 10% LAD stenosis, 20% LCx stenosis, and 40% RCA stenosis; b. nuc stress test 12/15: small region of mild ischemia in the apical region with WMA also noted in the apical region, EF 60%. 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 CHADS2VASc = > 8 (CHF, HTN, age x 2, DM, TIA x 2, female); b. on Xarelto    SBO (small bowel obstruction) (HCC)    1998   TIA (transient ischemic attack)    UTI (urinary tract infection)    Venous insufficiency    Ventral hernia     Micro Data:  1/1: SARS-CoV-2/RSV /Flu PCR>>negative 1/1: Blood cultures>> ESBL E. Coli 1/1: Urine>>  Antimicrobials:   Anti-infectives (From admission, onward)    Start     Dose/Rate Route Frequency Ordered Stop   04/26/23 0800  meropenem  (MERREM ) 1 g in sodium chloride  0.9 % 100 mL IVPB        1 g 200 mL/hr over 30 Minutes Intravenous Every 12 hours 04/26/23 0339     04/25/23 2300  meropenem  (MERREM ) 1 g in sodium chloride  0.9 % 100 mL IVPB  Status:  Discontinued        1 g 200 mL/hr over 30 Minutes Intravenous  Once 04/25/23 2250 04/26/23 0340   04/25/23 1900  meropenem  (MERREM ) 1 g in sodium chloride  0.9 % 100 mL IVPB        1 g 200 mL/hr over 30 Minutes Intravenous  Once 04/25/23 1854 04/25/23 2134       Significant Hospital Events: Including procedures, antibiotic start and stop dates in addition to other pertinent events   1/1: Presented to ED with AMS, admitted by Hospitalist for sepsis due to infect right ureteral stone.  Urology consulted, performed Right ureteral stent. 1/2: Became hypotensive concerning for developing septic shock.  Transferred to ICU with PCCM consultation for potential vasopressors. 04/27/23 - patient on 15mcg/kg/min today.   She continues to stay critically ill with ESBL bacteremia.  Prognosis is poor due to advanced age with septic shock. 1Her bloodwork shows interval marked increase in WBC count She remains on levophed  and meropenem .  Have ordered pathology slide reivew 04/28/23 - clinically improved today, eating 1/2 meal.  Still on pressors , wbc count trending down. CT abd/pelvis reassuring   Objective   Blood pressure 120/63, pulse 79, temperature 97.7 F (36.5 C), temperature source Oral, resp. rate 18, height 5' 4 (1.626 m), weight 79.1 kg, SpO2 92%.    FiO2 (%):  [28 %-36 %] 36 %   Intake/Output Summary (Last 24 hours) at 04/29/2023 1024 Last data filed at 04/29/2023 9048 Gross per 24 hour  Intake 607.72 ml  Output 1075 ml  Net -467.28 ml   Filed Weights   04/26/23 0112 04/26/23 1219  Weight: 78.1 kg 79.1 kg    Examination: General: Acute on chronically ill-appearing elderly female, laying in bed HENT: Atraumatic, normocephalic, neck supple, no JVD Lungs: Clear breath sounds throughout, even, nonlabored, normal effort Cardiovascular: Irregularly irregular rhythm, rate controlled, no murmurs, rubs, gallops Abdomen: Obese, soft, nontender, no guarding or rebound tenderness, bowel sounds positive x 4 Extremities: Generalized weakness, normal bulk and tone, no deformities, trace edema to bilateral lower extremities Neuro: Lethargic, arouses to pain, currently not following commands, pupils PERRLA GU: Foley catheter in place Skin: No FND grossly   Resolved Hospital Problem list     Assessment & Plan:   #Septic Shock-PRESENT ON ADMISSION - DUE TO ESBL BACTEREMIA Urinary source-Urology following, appreciate input -Continuous cardiac monitoring -Maintain MAP >65 -IV fluids -Vasopressors as needed to maintain MAP goal -c/w merem IV  #Severe Sepsis  #ESBL E. Coli BACTEREMIA  #Right Ureteral Stone infection ~ status post Right Ureteral Stent on 1/1 -Monitor fever curve -Trend WBC's &  Procalcitonin -Follow cultures as above -Continue empiric Meropenem  pending cultures & sensitivities  #Paroxymal Atrial Fibrillation #Acute Kidney Injury #AG Metabolic Acidosis #Mild Hypokalemia #Mild Hyponatremia   #Thrombocytopenia -Monitor for S/Sx of bleeding -Trend CBC -Lovenox  for VTE Prophylaxis  -Transfuse for Hgb <7  #Diabetes Mellitus Type II #Hypothyroidism -CBG's q4h; Target range of 140 to 180 -SSI -Follow ICU Hypo/Hyperglycemia protocol -Continue Synthroid   #Acute Metabolic Encephalopathy-RESOLVED     Best Practice (right click and Reselect all SmartList Selections daily)   Diet/type: NPO DVT prophylaxis: LMWH GI prophylaxis: N/A Lines: N/A Foley:  Yes, and it is still needed Code Status:  DNR Last date of multidisciplinary goals of care discussion [N/A]    Labs   CBC: Recent Labs  Lab 04/25/23 1803 04/26/23 0506 04/26/23 1258 04/27/23 0350 04/27/23 1642 04/28/23 0330 04/29/23 0410  WBC 12.2*   < >  27.9* 59.6* 57.8* 43.9* 51.3*  NEUTROABS 10.0*  --   --  52.4* 50.1* 38.3* 48.4*  HGB 12.7   < > 11.2* 12.4 11.9* 11.3* 11.1*  HCT 37.2   < > 32.8* 35.9* 34.9* 32.0* 31.9*  MCV 98.9   < > 100.0 99.4 99.4 97.9 97.0  PLT 164   < > 93* 116* 101* 84* 91*   < > = values in this interval not displayed.    Basic Metabolic Panel: Recent Labs  Lab 04/26/23 1258 04/26/23 2028 04/27/23 0350 04/28/23 0330 04/29/23 0410  NA 133* 132* 132* 133* 134*  K 3.6 3.7 3.9 4.0 3.6  CL 98 99 98 98 97*  CO2 18* 14* 19* 20* 25  GLUCOSE 172* 224* 177* 161* 293*  BUN 28* 35* 39* 53* 65*  CREATININE 1.88* 2.23* 2.11* 1.48* 1.22*  CALCIUM  7.6* 7.8* 7.5* 7.4* 7.7*  MG  --   --  1.7 1.8 2.4  PHOS  --   --  3.4  --   --    GFR: Estimated Creatinine Clearance: 29.3 mL/min (A) (by C-G formula based on SCr of 1.22 mg/dL (H)). Recent Labs  Lab 04/26/23 0606 04/26/23 1024 04/27/23 0350 04/27/23 0505 04/27/23 1642 04/27/23 1925 04/28/23 0330 04/28/23 1115  04/29/23 0410 04/29/23 0802  PROCALCITON 21.90  --  53.96  --   --   --  32.98  --  19.51  --   WBC  --    < > 59.6*  --  57.8*  --  43.9*  --  51.3*  --   LATICACIDVEN  --    < >  --    < > 4.3* 4.0*  --  3.4*  --  3.3*   < > = values in this interval not displayed.    Liver Function Tests: Recent Labs  Lab 04/25/23 1803 04/27/23 0350 04/28/23 0330 04/29/23 0410  AST 43* 137* 82* 46*  ALT 15 27 18 14   ALKPHOS 99 166* 124 140*  BILITOT 1.0 1.7* 1.1 0.8  PROT 7.7 6.2* 5.6* 5.6*  ALBUMIN 3.1* 2.3* 2.1* 2.0*   Recent Labs  Lab 04/25/23 1803  LIPASE 28   No results for input(s): AMMONIA in the last 168 hours.  ABG    Component Value Date/Time   PHART 7.39 04/26/2023 2028   PCO2ART 24 (L) 04/26/2023 2028   PO2ART 73 (L) 04/26/2023 2028   HCO3 14.5 (L) 04/26/2023 2028   ACIDBASEDEF 8.6 (H) 04/26/2023 2028   O2SAT 97.8 04/26/2023 2028     Coagulation Profile: Recent Labs  Lab 04/26/23 0506  INR 2.6*    Cardiac Enzymes: No results for input(s): CKTOTAL, CKMB, CKMBINDEX, TROPONINI in the last 168 hours.  HbA1C: Hgb A1c MFr Bld  Date/Time Value Ref Range Status  04/26/2023 05:06 AM 8.0 (H) 4.8 - 5.6 % Final    Comment:    (NOTE)         Prediabetes: 5.7 - 6.4         Diabetes: >6.4         Glycemic control for adults with diabetes: <7.0   10/10/2020 08:10 AM 7.8 (H) 4.8 - 5.6 % Final    Comment:    (NOTE)         Prediabetes: 5.7 - 6.4         Diabetes: >6.4         Glycemic control for adults with diabetes: <7.0     CBG: Recent Labs  Lab 04/28/23 1551 04/28/23 1941 04/28/23 2335 04/29/23 0404 04/29/23 0749  GLUCAP 172* 218* 166* 300* 201*    Review of Systems:   Unable to assess due to AMS   Past Medical History:  She,  has a past medical history of Allergy, Anxiety, Balance disorder, Chronic diastolic CHF (congestive heart failure) (HCC), Colon polyps, Coronary artery disease, non-occlusive, DM2 (diabetes mellitus, type 2) (HCC),  Fall, Falls frequently, GERD (gastroesophageal reflux disease), Granuloma annulare, History of chicken pox, History of eating disorder, HLD (hyperlipidemia), HTN (hypertension), Hypothyroidism, IBS (irritable bowel syndrome), Incontinence of bowel, Osteopenia, Osteoporosis, Ovarian cancer (HCC), Persistent atrial fibrillation (HCC), SBO (small bowel obstruction) (HCC), TIA (transient ischemic attack), UTI (urinary tract infection), Venous insufficiency, and Ventral hernia.   Surgical History:   Past Surgical History:  Procedure Laterality Date   2nd look laparotomy  1982   ABDOMINAL HYSTERECTOMY     for ovarian cancer s/p hysterectomy total 1976 and exp lap 1980   APPENDECTOMY     BACK SURGERY     CARDIAC CATHETERIZATION  8/05   neg; a fib found    CARPAL TUNNEL RELEASE Left 03/06/2018   Procedure: CARPAL TUNNEL RELEASE ENDOSCOPIC;  Surgeon: Edie Norleen PARAS, MD;  Location: [facility];  Service: Orthopedics;  Laterality: Left;  diabetic - oral meds   CARPAL TUNNEL RELEASE     left CTS Dr.Poggi   cataract OD  2002   cataract surgery  5/07   R   CHOLECYSTECTOMY  1986   CYSTOSCOPY WITH RETROGRADE PYELOGRAM, URETEROSCOPY AND STENT PLACEMENT Right 04/25/2023   Procedure: CYSTOSCOPY WITH RETROGRADE PYELOGRAM, URETEROSCOPY AND STENT PLACEMENT;  Surgeon: Selma Donnice SAUNDERS, MD;  Location: [facility];  Service: Urology;  Laterality: Right;   DEXA  9/04 and 1/02   EGD/dilation/colon  1/06   ESOPHAGOGASTRODUODENOSCOPY (EGD) WITH PROPOFOL  N/A 05/16/2016   Procedure: ESOPHAGOGASTRODUODENOSCOPY (EGD) WITH PROPOFOL  with dilation;  Surgeon: Rogelia Copping, MD;  Location: [facility];  Service: Endoscopy;  Laterality: N/A;   FEMORAL HERNIA REPAIR  1958   R   fracture L elbow/wrist  2000   INTRAMEDULLARY (IM) NAIL INTERTROCHANTERIC Left 10/12/2020   Procedure: INTRAMEDULLARY (IM) NAIL INTERTROCHANTRIC;  Surgeon: Krasinski, Kevin, MD;  Location: [facility];  Service: Orthopedics;  Laterality: Left;    intussception/obstruction  12/98   JOINT REPLACEMENT     left shoulder   kidney stone x2  1991   KYPHOPLASTY N/A 04/13/2020   Procedure: [id number];  Surgeon: Kathlynn Sharper, MD;  Location: [facility];  Service: Orthopedics;  Laterality: N/A;   KYPHOPLASTY N/A 05/18/2020   Procedure: L1 KYPHOPLASTY;  Surgeon: Kathlynn Sharper, MD;  Location: [facility];  Service: Orthopedics;  Laterality: N/A;   KYPHOPLASTY N/A 07/06/2020   Procedure: L4  KYPHOPLASTY;  Surgeon: Kathlynn Sharper, MD;  Location: [facility];  Service: Orthopedics;  Laterality: N/A;   laminectomy L4-5  1971   LUMBAR LAMINECTOMY     1970 ruptured disc    MOUTH SURGERY     myoview  stress  8/05   syncope (-)   REVERSE SHOULDER ARTHROPLASTY Left 10/06/2014   Procedure: REVERSE SHOULDER ARTHROPLASTY;  Surgeon: Norleen PARAS Edie, MD;  Location: [facility];  Service: Orthopedics;  Laterality: Left;   stillbirth  1969   TOTAL ABDOMINAL HYSTERECTOMY W/ BILATERAL SALPINGOOPHORECTOMY  1980   ovarian cancer   WRIST FRACTURE SURGERY  11/05   R     Social History:   reports that she has never smoked. She has never used smokeless tobacco. She reports that  she does not drink alcohol  and does not use drugs.   Family History:  Her family history includes Arthritis in her brother, brother, and sister; COPD in her brother; Cancer in her brother, brother, and brother; Depression in her brother and sister; Diabetes in her brother, mother, and paternal grandfather; Early death in her brother, brother, and father; Hearing loss in her brother, brother, brother, daughter, sister, and sister; Heart disease in her brother, mother, sister, sister, and sister; Hyperlipidemia in her brother; Hypertension in her daughter and sister; Stroke in her sister.   Allergies Allergies  Allergen Reactions   Amiodarone  Other (See Comments)    Severe Thyroid  issues    Fosamax [Alendronate]     dysphagia   Penicillins Anaphylaxis and Other (See Comments)    TOLERATED CEFAZOLIN   07/06/20 Has patient had a PCN reaction causing immediate rash, facial/tongue/throat swelling, SOB or lightheadedness with hypotension: Yes Has patient had a PCN reaction causing severe rash involving mucus membranes or skin necrosis: No Has patient had a PCN reaction that required hospitalization: No Has patient had a PCN reaction occurring within the last 10 years: No If all of the above answers are NO, then may proceed with Cephalosporin use.    Sulfa Antibiotics Itching and Rash    Itching    Actos [Pioglitazone]     Not effective    Amaryl [Glimepiride]     Not effective     Atorvastatin Other (See Comments)    Muscle aches & All statins per Patient   Bentyl  [Dicyclomine  Hcl]     Could not tolerate nausea, reduced concentration, h/a    Ciprofloxacin  Other (See Comments)    High blood pressure     Codeine Nausea And Vomiting   Ezetimibe-Simvastatin Other (See Comments)    Muscle aches, nausea, back pain    Lovastatin Other (See Comments)    Myalgias   Macrobid  [Nitrofurantoin  Macrocrystal]     Severe Itching, rash    Protonix  [Pantoprazole  Sodium]     Esophageal problems  Wants removed from list   Rosiglitazone Maleate Other (See Comments)    Edema   Statins     Muscle and joint aches to all statins   Victoza [Liraglutide]     Nausea    Alendronate Sodium Rash    Dysphagia and ulceration    Bactrim [Sulfamethoxazole-Trimethoprim] Rash    itching     Home Medications  Prior to Admission medications   Medication Sig Start Date End Date Taking? Authorizing Provider  acetaminophen  (TYLENOL ) 500 MG tablet Take 1,000 mg by mouth every 6 (six) hours as needed.   Yes [provider]  alum & mag hydroxide-simeth (MAALOX/MYLANTA) 200-200-20 MG/5ML suspension Take 30 mLs by mouth every 4 (four) hours as needed for indigestion. 10/20/20  Yes Fausto Sor A, DO  apixaban  (ELIQUIS ) 5 MG TABS tablet Take 5 mg by mouth 2 (two) times daily.   Yes [provider]  Ascorbic Acid  (VITAMIN C ) 1000 MG tablet Take 2,000 mg by mouth daily.   Yes [provider]  bisoprolol  (ZEBETA ) 5 MG tablet Take 5 mg by mouth in the morning and at bedtime.   Yes [provider]  CALCIUM  ANTACID EXTRA STRENGTH 750 MG chewable tablet Chew by mouth. 08/22/21  Yes [provider]  diclofenac Sodium (VOLTAREN) 1 % GEL Apply 2 g topically in the morning, at noon, and at bedtime.   Yes [provider]  estradiol  (ESTRACE ) 0.1 MG/GM vaginal cream APPLY 0.5MG  (PEA  SIZED AMOUNT) JUST INSIDE THE VAGINA WITH FINGER-TIP ON MONDAY, WEDNESDAY AND FRIDAY NIGHTS. 07/21/20  Yes McGowan, Clotilda A, PA-C  famotidine  (PEPCID ) 20 MG tablet Take 20 mg by mouth at bedtime. 12/29/21  Yes [provider]  fluticasone  (FLONASE ) 50 MCG/ACT nasal spray Place 1 spray into both nostrils daily.   Yes [provider]  folic acid  (FOLVITE ) 1 MG tablet Take 1 tablet (1 mg total) by mouth daily. Patient taking differently: Take 1 mg by mouth every other day. 10/21/20  Yes Fausto Sor A, DO  furosemide  (LASIX ) 20 MG tablet Take 1 tablet (20 mg total) by mouth 2 (two) times daily. 10/05/20  Yes Gollan, Timothy J, MD  hydrOXYzine  (ATARAX ) 25 MG tablet Take 25 mg by mouth every 8 (eight) hours as needed for anxiety. 04/25/23 05/09/23 Yes [provider]  insulin  aspart (NOVOLOG ) 100 UNIT/ML injection Inject 0-15 Units into the skin 4 (four) times daily -  before meals and at bedtime. Patient taking differently: Inject 0-15 Units into the skin 4 (four) times daily. 200-249=2u, 250-299=4u, 300-349=6u, 350-399=8u, 400-449=10u, 450-499=12u, if 500 or greater=call MD 10/20/20  Yes Fausto Sor A, DO  insulin  glargine (LANTUS ) 100 UNIT/ML injection Inject 0.1 mLs (10 Units total) into the skin daily. Patient taking differently: Inject 8 Units into the skin daily. 10/21/20  Yes Fausto Sor A, DO  levothyroxine  (SYNTHROID , LEVOTHROID) 50 MCG tablet  Take 50 mcg by mouth daily before breakfast.   Yes [provider]  magnesium  hydroxide (MILK OF MAGNESIA) 400 MG/5ML suspension Take 30 mLs by mouth daily as needed for mild constipation. 10/20/20  Yes Fausto Sor LABOR, DO  Magnesium  Oxide 250 MG TABS Take 250 mg by mouth daily.   Yes [provider]  Multiple Vitamins-Minerals (HEALTHY EYES SUPERVISION 2) CAPS Take 1 capsule by mouth 2 (two) times daily.   Yes [provider]  Multiple Vitamins-Minerals (MULTIVITAMIN GUMMIES ADULT PO) Take 1 each by mouth daily.   Yes [provider]  nystatin powder Apply 1 Application topically 3 (three) times daily as needed.   Yes [provider]  Olopatadine  HCl 0.2 % SOLN Place 1 drop into both eyes daily as needed (Allergies).   Yes [provider]  Omega-3 Fatty Acids (FISH OIL) 1000 MG CAPS Take 1 capsule by mouth 2 (two) times daily.   Yes [provider]  ondansetron  (ZOFRAN ) 4 MG tablet Take 1 tablet (4 mg total) by mouth every 6 (six) hours as needed for nausea. 10/20/20  Yes Fausto Sor A, DO  pyridOXINE  (B-6) 50 MG tablet Take 1 tablet (50 mg total) by mouth daily. 10/21/20  Yes Fausto Sor A, DO  TRADJENTA  5 MG TABS tablet Take 5 mg by mouth daily. 10/05/20  Yes [provider]  vitamin B-12 (CYANOCOBALAMIN ) 1000 MCG tablet Take 1,000 mcg by mouth daily. W/ Folate and B6 sublingual   Yes [provider]  aspirin  325 MG tablet Take 325 mg by mouth daily. Patient not taking: Reported on 04/25/2023 12/29/21   [provider]  bacitracin 500 UNIT/GM ointment Apply 1 Application topically daily. Patient not taking: Reported on 12/21/2022    [provider]  docusate sodium  (COLACE) 100 MG capsule Take 100 mg by mouth daily as needed for mild constipation. Patient not taking: Reported on 12/21/2022    [provider]  glucose blood (ONE TOUCH ULTRA TEST) test strip Use to test blood sugar once  daily E11.49 06/24/15   Avelina Greig BRAVO, MD  insulin  glargine-yfgn (SEMGLEE ) 100 UNIT/ML Pen Inject into the skin. Patient not taking: Reported on 08/25/2021 02/25/21   [provider]  naproxen  sodium (ALEVE ) 220 MG tablet Take 220 mg by mouth daily as needed. Patient not taking: Reported on 04/25/2023    [provider]  psyllium (METAMUCIL) 58.6 % packet Take 1 packet by mouth daily as needed (constipation). Patient not taking: Reported on 04/25/2023    [provider]  rivaroxaban  (XARELTO ) 20 MG TABS tablet Take 1 tablet (20 mg total) by mouth daily with supper. Patient not taking: Reported on 12/21/2022 01/04/22   Abigail Bernardino HERO, PA-C  simethicone (MYLICON) 80 MG chewable tablet Chew 80 mg by mouth every 6 (six) hours as needed for flatulence. Patient not taking: Reported on 04/25/2023    [provider]  Skin Protectants, Misc. (DIMETHICONE-ZINC OXIDE) cream Apply topically 2 (two) times daily as needed for dry skin. Please label the bottle for the vaginal area only. Patient not taking: Reported on 04/25/2023 06/29/20   Helon Kirsch A, PA-C  SODIUM FLUORIDE 5000 PPM 1.1 % GEL dental gel Take by mouth. Patient not taking: Reported on 04/25/2023 05/10/21   [provider]     Critical care provider statement:   Total critical care time: 33 minutes   Performed by: Parris MD   Critical care time was exclusive of separately billable procedures and treating other patients.   Critical care was necessary to treat or prevent imminent or life-threatening deterioration.   Critical care was time spent personally by me on the following activities: development of treatment plan with patient and/or surrogate as well as nursing, discussions with consultants, evaluation of patient's response to treatment, examination of patient, obtaining history from patient or surrogate, ordering and performing treatments and interventions, ordering and review of laboratory studies,  ordering and review of radiographic studies, pulse oximetry and re-evaluation of patient's condition.    Danette Weinfeld, M.D.  Pulmonary & Critical Care Medicine

## 2023-04-29 NOTE — Plan of Care (Signed)
  Problem: Fluid Volume: Goal: Hemodynamic stability will improve Outcome: Progressing   Problem: Respiratory: Goal: Ability to maintain adequate ventilation will improve Outcome: Progressing   Problem: Clinical Measurements: Goal: Ability to maintain clinical measurements within normal limits will improve Outcome: Progressing   Problem: Activity: Goal: Risk for activity intolerance will decrease Outcome: Progressing   Problem: Nutrition: Goal: Adequate nutrition will be maintained Outcome: Progressing   Problem: Elimination: Goal: Will not experience complications related to bowel motility Outcome: Progressing   Problem: Pain Management: Goal: General experience of comfort will improve Outcome: Progressing   Problem: Safety: Goal: Ability to remain free from injury will improve Outcome: Progressing

## 2023-04-29 NOTE — Plan of Care (Signed)
  Problem: Clinical Measurements: Goal: Diagnostic test results will improve Outcome: Progressing   Problem: Respiratory: Goal: Ability to maintain adequate ventilation will improve Outcome: Progressing   Problem: Education: Goal: Knowledge of General Education information will improve Description: Including pain rating scale, medication(s)/side effects and non-pharmacologic comfort measures Outcome: Progressing   Problem: Clinical Measurements: Goal: Ability to maintain clinical measurements within normal limits will improve Outcome: Progressing

## 2023-04-30 DIAGNOSIS — A419 Sepsis, unspecified organism: Secondary | ICD-10-CM | POA: Diagnosis not present

## 2023-04-30 DIAGNOSIS — E1142 Type 2 diabetes mellitus with diabetic polyneuropathy: Secondary | ICD-10-CM | POA: Diagnosis not present

## 2023-04-30 DIAGNOSIS — N200 Calculus of kidney: Secondary | ICD-10-CM

## 2023-04-30 DIAGNOSIS — I480 Paroxysmal atrial fibrillation: Secondary | ICD-10-CM | POA: Diagnosis not present

## 2023-04-30 DIAGNOSIS — N179 Acute kidney failure, unspecified: Secondary | ICD-10-CM | POA: Diagnosis not present

## 2023-04-30 LAB — COMPREHENSIVE METABOLIC PANEL
ALT: 13 U/L (ref 0–44)
AST: 37 U/L (ref 15–41)
Albumin: 1.9 g/dL — ABNORMAL LOW (ref 3.5–5.0)
Alkaline Phosphatase: 125 U/L (ref 38–126)
Anion gap: 10 (ref 5–15)
BUN: 53 mg/dL — ABNORMAL HIGH (ref 8–23)
CO2: 28 mmol/L (ref 22–32)
Calcium: 7.9 mg/dL — ABNORMAL LOW (ref 8.9–10.3)
Chloride: 100 mmol/L (ref 98–111)
Creatinine, Ser: 0.76 mg/dL (ref 0.44–1.00)
GFR, Estimated: >60 mL/min (ref >60)
Glucose, Bld: 111 mg/dL — ABNORMAL HIGH (ref 70–99)
Potassium: 3.9 mmol/L (ref 3.5–5.1)
Sodium: 138 mmol/L (ref 135–145)
Total Bilirubin: 0.7 mg/dL (ref 0.0–1.2)
Total Protein: 5.2 g/dL — ABNORMAL LOW (ref 6.5–8.1)

## 2023-04-30 LAB — CBC WITH DIFFERENTIAL/PLATELET
Abs Immature Granulocytes: 0.07 10*3/uL (ref 0.00–0.07)
Basophils Absolute: 0 10*3/uL (ref 0.0–0.1)
Basophils Relative: 0 %
Eosinophils Absolute: 0.1 10*3/uL (ref 0.0–0.5)
Eosinophils Relative: 1 %
HCT: 31.7 % — ABNORMAL LOW (ref 36.0–46.0)
Hemoglobin: 10.7 g/dL — ABNORMAL LOW (ref 12.0–15.0)
Immature Granulocytes: 1 %
Lymphocytes Relative: 10 %
Lymphs Abs: 1.3 10*3/uL (ref 0.7–4.0)
MCH: 33.9 pg (ref 26.0–34.0)
MCHC: 33.8 g/dL (ref 30.0–36.0)
MCV: 100.3 fL — ABNORMAL HIGH (ref 80.0–100.0)
Monocytes Absolute: 0.6 10*3/uL (ref 0.1–1.0)
Monocytes Relative: 5 %
Neutro Abs: 10.8 10*3/uL — ABNORMAL HIGH (ref 1.7–7.7)
Neutrophils Relative %: 83 %
Platelets: 66 10*3/uL — ABNORMAL LOW (ref 150–400)
RBC: 3.16 MIL/uL — ABNORMAL LOW (ref 3.87–5.11)
RDW: 13.4 % (ref 11.5–15.5)
WBC: 12.8 10*3/uL — ABNORMAL HIGH (ref 4.0–10.5)
nRBC: 0 % (ref 0.0–0.2)

## 2023-04-30 LAB — GLUCOSE, CAPILLARY
Glucose-Capillary: 100 mg/dL — ABNORMAL HIGH (ref 70–99)
Glucose-Capillary: 116 mg/dL — ABNORMAL HIGH (ref 70–99)
Glucose-Capillary: 162 mg/dL — ABNORMAL HIGH (ref 70–99)
Glucose-Capillary: 171 mg/dL — ABNORMAL HIGH (ref 70–99)
Glucose-Capillary: 193 mg/dL — ABNORMAL HIGH (ref 70–99)
Glucose-Capillary: 95 mg/dL (ref 70–99)

## 2023-04-30 LAB — MAGNESIUM: Magnesium: 2.2 mg/dL (ref 1.7–2.4)

## 2023-04-30 LAB — PHOSPHORUS: Phosphorus: 2.1 mg/dL — ABNORMAL LOW (ref 2.5–4.6)

## 2023-04-30 MED ORDER — APIXABAN 5 MG PO TABS
5.0000 mg | ORAL_TABLET | Freq: Two times a day (BID) | ORAL | Status: DC
  Administered 2023-04-30 – 2023-05-07 (×15): 5 mg via ORAL
  Filled 2023-04-30 (×15): qty 1

## 2023-04-30 MED ORDER — K PHOS MONO-SOD PHOS DI & MONO 155-852-130 MG PO TABS
500.0000 mg | ORAL_TABLET | Freq: Once | ORAL | Status: AC
  Administered 2023-04-30: 500 mg via ORAL
  Filled 2023-04-30: qty 2

## 2023-04-30 MED ORDER — LEVOTHYROXINE SODIUM 50 MCG PO TABS
50.0000 ug | ORAL_TABLET | Freq: Every day | ORAL | Status: DC
  Administered 2023-05-01 – 2023-05-07 (×7): 50 ug via ORAL
  Filled 2023-04-30 (×7): qty 1

## 2023-04-30 MED ORDER — ENSURE ENLIVE PO LIQD
237.0000 mL | Freq: Two times a day (BID) | ORAL | Status: DC
  Administered 2023-05-01 – 2023-05-07 (×12): 237 mL via ORAL

## 2023-04-30 NOTE — Plan of Care (Signed)
  Problem: Fluid Volume: Goal: Hemodynamic stability will improve Outcome: Progressing   Problem: Clinical Measurements: Goal: Diagnostic test results will improve Outcome: Progressing   Problem: Respiratory: Goal: Ability to maintain adequate ventilation will improve Outcome: Progressing   Problem: Clinical Measurements: Goal: Will remain free from infection Outcome: Progressing Goal: Respiratory complications will improve Outcome: Progressing Goal: Cardiovascular complication will be avoided Outcome: Progressing   Problem: Education: Goal: Knowledge of General Education information will improve Description: Including pain rating scale, medication(s)/side effects and non-pharmacologic comfort measures Outcome: Not Progressing   Problem: Activity: Goal: Risk for activity intolerance will decrease Outcome: Not Progressing   Problem: Coping: Goal: Level of anxiety will decrease Outcome: Not Progressing

## 2023-04-30 NOTE — Plan of Care (Signed)
  Problem: Fluid Volume: Goal: Hemodynamic stability will improve Outcome: Not Progressing   Problem: Clinical Measurements: Goal: Diagnostic test results will improve Outcome: Not Progressing Goal: Signs and symptoms of infection will decrease Outcome: Not Progressing   Problem: Respiratory: Goal: Ability to maintain adequate ventilation will improve Outcome: Not Progressing   Problem: Education: Goal: Knowledge of General Education information will improve Description: Including pain rating scale, medication(s)/side effects and non-pharmacologic comfort measures Outcome: Not Progressing   Problem: Health Behavior/Discharge Planning: Goal: Ability to manage health-related needs will improve Outcome: Not Progressing   Problem: Clinical Measurements: Goal: Ability to maintain clinical measurements within normal limits will improve Outcome: Not Progressing Goal: Will remain free from infection Outcome: Not Progressing Goal: Diagnostic test results will improve Outcome: Not Progressing Goal: Respiratory complications will improve Outcome: Not Progressing Goal: Cardiovascular complication will be avoided Outcome: Not Progressing   Problem: Activity: Goal: Risk for activity intolerance will decrease Outcome: Not Progressing   Problem: Nutrition: Goal: Adequate nutrition will be maintained Outcome: Not Progressing   Problem: Coping: Goal: Level of anxiety will decrease Outcome: Not Progressing   Problem: Elimination: Goal: Will not experience complications related to bowel motility Outcome: Not Progressing Goal: Will not experience complications related to urinary retention Outcome: Not Progressing   Problem: Pain Management: Goal: General experience of comfort will improve Outcome: Not Progressing   Problem: Safety: Goal: Ability to remain free from injury will improve Outcome: Not Progressing   Problem: Skin Integrity: Goal: Risk for impaired skin integrity  will decrease Outcome: Not Progressing   Problem: Education: Goal: Ability to describe self-care measures that may prevent or decrease complications (Diabetes Survival Skills Education) will improve Outcome: Not Progressing Goal: Individualized Educational Video(s) Outcome: Not Progressing   Problem: Coping: Goal: Ability to adjust to condition or change in health will improve Outcome: Not Progressing   Problem: Fluid Volume: Goal: Ability to maintain a balanced intake and output will improve Outcome: Not Progressing   Problem: Health Behavior/Discharge Planning: Goal: Ability to identify and utilize available resources and services will improve Outcome: Not Progressing Goal: Ability to manage health-related needs will improve Outcome: Not Progressing   Problem: Metabolic: Goal: Ability to maintain appropriate glucose levels will improve Outcome: Not Progressing   Problem: Nutritional: Goal: Maintenance of adequate nutrition will improve Outcome: Not Progressing Goal: Progress toward achieving an optimal weight will improve Outcome: Not Progressing   Problem: Skin Integrity: Goal: Risk for impaired skin integrity will decrease Outcome: Not Progressing   Problem: Tissue Perfusion: Goal: Adequacy of tissue perfusion will improve Outcome: Not Progressing

## 2023-04-30 NOTE — Evaluation (Signed)
 Physical Therapy Evaluation Patient Details Name: Jodi Reeves MRN: [id number] DOB: 1928-04-18 Today's Date: 04/30/2023  History of Present Illness  Patient is a 89 year old female with acute metabolic encephalopathy with severe sepsis and right ureteral stone infection s/p cystoscopy and stent placement. Complicated by septic shock. Patient has history of anxiety, CAD, CHF, GERD, HTN, dyslipidemia, hypothyroidism, TIA, IBS  Clinical Impression  Patient is agreeable to PT evaluation. She seems mildly confused but is able to follow single step commands with increased time. The patient reports she needs assistance for pivot transfer to wheelchair and can propel her chair around the facility without assistance at baseline.  Today, the patient required +2 person assistance for bed mobility. She reports mild dizziness with sitting upright as well as nausea with mobility. Sitting tolerance is limited by fatigue with minimal activity. Recommend to continue PT to maximize independence and facilitate return to prior level of function. Consider rehabilitation < 3 hours/day after this hospital stay.       If plan is discharge home, recommend the following: Two people to help with walking and/or transfers;Assistance with cooking/housework;Help with stairs or ramp for entrance;Assist for transportation;Supervision due to cognitive status;Direct supervision/assist for medications management;Direct supervision/assist for financial management;A lot of help with bathing/dressing/bathroom   Can travel by private vehicle   No    Equipment Recommendations None recommended by PT  Recommendations for Other Services       Functional Status Assessment Patient has had a recent decline in their functional status and demonstrates the ability to make significant improvements in function in a reasonable and predictable amount of time.     Precautions / Restrictions Precautions Precautions: Fall Precaution Comments:  A-line LUE Restrictions Weight Bearing Restrictions Per Provider Order: No      Mobility  Bed Mobility Overal bed mobility: Needs Assistance Bed Mobility: Supine to Sit, Sit to Supine     Supine to sit: Mod assist, +2 for physical assistance Sit to supine: Max assist, +2 for physical assistance   General bed mobility comments: verbal cues for task initiation and sequencing    Transfers                   General transfer comment: not assessed due to generalized weakness and fatigue with minimal activity. dizziness reported with upright activity with MAP in the 80's    Ambulation/Gait                  Stairs            Wheelchair Mobility     Tilt Bed    Modified Rankin (Stroke Patients Only)       Balance Overall balance assessment: Needs assistance Sitting-balance support: Feet supported Sitting balance-Leahy Scale: Poor Sitting balance - Comments: poor progressing to fair.                                     Pertinent Vitals/Pain Pain Assessment Pain Assessment: No/denies pain    Home Living Family/patient expects to be discharged to:: Skilled nursing facility                   Additional Comments: long term resident at [facility]    Prior Function Prior Level of Function : Needs assist             Mobility Comments: patient reports staff assist with transfer to wheelchair.  patient is Mod I with wheelchair mobility around the facility ADLs Comments: intermittent assistance required per patient report     Extremity/Trunk Assessment   Upper Extremity Assessment Upper Extremity Assessment: Generalized weakness    Lower Extremity Assessment Lower Extremity Assessment: Generalized weakness       Communication   Communication Communication: Difficulty following commands/understanding Following commands: Follows one step commands with increased time Cueing Techniques: Verbal cues  Cognition  Arousal: Alert Behavior During Therapy: WFL for tasks assessed/performed Overall Cognitive Status: No family/caregiver present to determine baseline cognitive functioning                                 General Comments: Patient is oriented to place, time, and self. She is able to follow single step commands with increased time. Some confusion about recent events        General Comments General comments (skin integrity, edema, etc.): patient reports mild nausea with mobility. delayed cough noted after taking a sip of water  via straw- nurse alerted    Exercises     Assessment/Plan    PT Assessment Patient needs continued PT services  PT Problem List Decreased strength;Decreased range of motion;Decreased activity tolerance;Decreased balance;Decreased mobility;Decreased cognition       PT Treatment Interventions DME instruction;Functional mobility training;Therapeutic activities;Therapeutic exercise;Balance training;Neuromuscular re-education;Cognitive remediation;Wheelchair mobility training;Patient/family education    PT Goals (Current goals can be found in the Care Plan section)  Acute Rehab PT Goals Patient Stated Goal: to feel better PT Goal Formulation: With patient Time For Goal Achievement: 05/14/23 Potential to Achieve Goals: Fair    Frequency Min 1X/week     Co-evaluation PT/OT/SLP Co-Evaluation/Treatment: Yes Reason for Co-Treatment: Complexity of the patient's impairments (multi-system involvement);To address functional/ADL transfers PT goals addressed during session: Mobility/safety with mobility OT goals addressed during session: ADL's and self-care       AM-PAC PT 6 Clicks Mobility  Outcome Measure Help needed turning from your back to your side while in a flat bed without using bedrails?: A Lot Help needed moving from lying on your back to sitting on the side of a flat bed without using bedrails?: Total Help needed moving to and from a bed  to a chair (including a wheelchair)?: Total Help needed standing up from a chair using your arms (e.g., wheelchair or bedside chair)?: Total Help needed to walk in hospital room?: Total Help needed climbing 3-5 steps with a railing? : Total 6 Click Score: 7    End of Session Equipment Utilized During Treatment: Oxygen Activity Tolerance: Patient limited by fatigue Patient left: in bed;with call bell/phone within reach;with bed alarm set;with SCD's reapplied Nurse Communication: Mobility status PT Visit Diagnosis: Muscle weakness (generalized) (M62.81);Unsteadiness on feet (R26.81)    Time: 9094-9069 PT Time Calculation (min) (ACUTE ONLY): 25 min   Charges:   PT Evaluation $PT Eval Moderate Complexity: 1 Mod   PT General Charges $$ ACUTE PT VISIT: 1 Visit        Randine Essex, PT, MPT   Randine LULLA Essex 04/30/2023, 11:52 AM

## 2023-04-30 NOTE — Progress Notes (Signed)
 NAME:  Jodi Reeves, MRN:  [id number], DOB:  1927/09/14, LOS: 5 ADMISSION DATE:  04/25/2023, CONSULTATION DATE:  04/26/2023 REFERRING MD:  Dr. Patsy, CHIEF COMPLAINT:  Septic shock   Brief Pt Description / Synopsis:  89 y.o. female admitted with Acute Metabolic Encephalopathy in setting of Severe Sepsis due to  ESBL E. Coli BACTEREMIA due to Right Ureteral Stone infection, status post Cystoscopy and right ureteral stent placement.  Course complicated by development of septic shock.  History of Present Illness:  Jodi Reeves is a 89 y.o. female with a past medical history significant for anxiety, coronary artery disease, diastolic CHF, GERD, hypertension, dyslipidemia, hypothyroidism, TIA and IBS, who presented to the ER on 04/25/23 with acute onset of dysuria with altered mental status.  Pt is currently altered and unable to contribute to history and no family currently available, therefore history is obtained from chart review.   Per H&P by Hospitalist, over the past few days she has not been acting her normal self with more fatigue and more confusion.  Her urine looked cloudy when they collected at her [facility].  They thought she had a UTI but could not get her antibiotic today.  They therefore sent her to the emergency room for concern about sepsis.  She did not have reported nausea or vomiting or abdominal pain.  No reported chest pain or palpitations, cough or wheezing or hemoptysis. The patient was a fairly poor historian as she was seen post procedure and was very somnolent.   ED Course: Initial Vital Signs: , temperature was 101.5 with otherwise normal vitals. Respiratory rate later on was 24 and heart rate 99 then 110.  Significant Labs: mild hyponatremia hypochloremia blood glucose 143, creatinine 1.26 and AST 43 with albumin 3.1 and total protein 7.7. Lactic acid was 2.4 and later 1.1. CBC showed leukocytosis 12.2 with neutrophilia. Respiratory panel came back negative. UA was positive for  UTI. Blood culture was sent.  Imaging Chest X-ray>>linear consolidation within the left lung base favoring scarring or atelectasis or pneumonia.  Medications Administered: given IV meropenem  given her history of anaphylaxis to penicillin but in the OR she was given IV Ancef . She was also given 1 g of p.o. Tylenol  and 1 L bolus of IV normal saline in the ER   She was admitted by the Hospitalists for further workup and treatment.  Urology was consulted and performed urgent Right ureteral stent placement.  Please see Significant Hospital Events section below for full detailed hospital course.    Pertinent  Medical History   Past Medical History:  Diagnosis Date   Allergy    Anxiety    Balance disorder    Chronic diastolic CHF (congestive heart failure) (HCC)    a. 10/2018 Echo: EF 55-60%, diast dysfxn, nl RV fxn. RVSP 45.50mmHg. Mild-mod TR. Mod dil LA.   Colon polyps    Coronary artery disease, non-occlusive    a. LHC 11/2003: 10% LAD stenosis, 20% LCx stenosis, and 40% RCA stenosis; b. nuc stress test 12/15: small region of mild ischemia in the apical region with WMA also noted in the apical region, EF 60%. 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 CHADS2VASc = > 8 (CHF, HTN, age x 2, DM, TIA x 2, female); b. on Xarelto    SBO (small bowel obstruction) (HCC)    1998   TIA (transient  ischemic attack)    UTI (urinary tract infection)    Venous insufficiency    Ventral hernia     Micro Data:  1/1: SARS-CoV-2/RSV /Flu PCR>>negative 1/1: Blood cultures>> ESBL E. Coli 1/1: Urine>> ESBL E. Coli 1/2: MRSA PCR + 1/6: Repeat Blood cultures>>  Antimicrobials:   Anti-infectives (From admission, onward)    Start     Dose/Rate Route Frequency Ordered Stop   04/26/23 0800  meropenem  (MERREM ) 1 g in sodium chloride  0.9 % 100 mL IVPB        1 g 200 mL/hr over 30 Minutes Intravenous Every 12 hours 04/26/23 0339     04/25/23 2300  meropenem  (MERREM ) 1 g in sodium chloride  0.9 % 100 mL IVPB  Status:  Discontinued        1 g 200 mL/hr over 30 Minutes Intravenous  Once 04/25/23 2250 04/26/23 0340   04/25/23 1900  meropenem  (MERREM ) 1 g in sodium chloride  0.9 % 100 mL IVPB        1 g 200 mL/hr over 30 Minutes Intravenous  Once 04/25/23 1854 04/25/23 2134       Significant Hospital Events: Including procedures, antibiotic start and stop dates in addition to other pertinent events   1/1: Presented to ED with AMS, admitted by Hospitalist for sepsis due to infect right ureteral stone.  Urology consulted, performed Right ureteral stent. 1/2: Became hypotensive concerning for developing septic shock.  Transferred to ICU with PCCM consultation for potential vasopressors. 04/27/23 - patient on 15mcg/kg/min today.  She continues to stay critically ill with ESBL bacteremia.  Prognosis is poor due to advanced age with septic shock. 1Her bloodwork shows interval marked increase in WBC count She remains on levophed  and meropenem .  Have ordered pathology slide reivew 04/28/23 - clinically improved today, eating 1/2 meal.  Still on pressors , wbc count trending down. CT abd/pelvis reassuring 04/29/23- patient improved , off levophed , hyperglycemia noted treated with weight based regimen today from home.  Aki improved with over 1L UOP overnight no hematuria now. Mentation still with mild  encephalopathy.   Wbc count still markedly elevated and she remains on merem for ESBL bacteremia.  04/30/23 - weaned off Vasopressors.  Leukocytosis improved, AKI resolved.  Worsening Thrombocytopenia, will rule out HIT, stop Lovenox  and transition back to home Eliquis .  Consult PT/OT.  Will remove Central line and A-line as they are no longer indicated  Interim History / Subjective:  -As outlined above   Objective   Blood pressure 120/63, pulse 84, temperature 97.7 F (36.5 C), temperature source Axillary, resp. rate 13, height 5' 4 (1.626 m), weight 79.1 kg, SpO2 93%.        Intake/Output Summary (Last 24 hours) at 04/30/2023 0801 Last data filed at 04/30/2023 0508 Gross per 24 hour  Intake 1306.55 ml  Output 1000 ml  Net 306.55 ml  Filed Weights   04/26/23 0112 04/26/23 1219  Weight: 78.1 kg 79.1 kg    Examination: General: Acute on chronically ill-appearing elderly female, laying in bed, on room air, no acute distress HENT: Atraumatic, normocephalic, neck supple, no JVD Lungs: Clear breath sounds throughout, even, nonlabored, normal effort Cardiovascular: Irregularly irregular rhythm, rate controlled, no murmurs, rubs, gallops Abdomen: Obese, soft, nontender, no guarding or rebound tenderness, bowel sounds positive x 4 Extremities: Generalized weakness, normal bulk and tone, no deformities, trace edema to bilateral lower extremities Neuro: Awake and alert, oriented to person and place, following commands, no focal deficits noted, pupils PERRLA GU: Foley catheter in place Skin: No obvious rashes, lesions, ulcerations.  No pressure ulcer noted upon arrival to ICU  Resolved Hospital Problem list   Septic Shock Acute Kidney Injury AG Metabolic Acidosis Hypokalemia Hyponatremia  Assessment & Plan:   #Septic Shock ~ RESOLVED  #Paroxymal Atrial Fibrillation Echocardiogram 04/27/23: LVEF 50-55%, indeterminate diastolic parameters, RV systolic function mildly reduced, moderately  elevated pulmonary artery systolic pressure, small pericardial effusion is present (no tamponade), moderated TR -Continuous cardiac monitoring -Maintain MAP >65 -Vasopressors as needed to maintain MAP goal ~ WEANED OFF -Transition back to home Eliquis  for Anticoagulation  #Severe Sepsis  #ESBL E. Coli BACTEREMIA  #Right Ureteral Stone infection ~ status post Right Ureteral Stent on 1/1 -Monitor fever curve -Trend WBC's & Procalcitonin -Follow cultures as above -Continue empiric Meropenem  pending cultures & sensitivities -Urology following, appreciate input  #Acute Kidney Injury ~ RESOLVED #AG Metabolic Acidosis ~ RESOLVED #Mild Hypokalemia ~ RESOLVED #Mild Hyponatremia ~ RESOLVED -Monitor I&O's / urinary output -Follow BMP -Ensure adequate renal perfusion -Avoid nephrotoxic agents as able -Replace electrolytes as indicated ~ Pharmacy following for assistance with electrolyte replacement -IV fluids  #Thrombocytopenia -Monitor for S/Sx of bleeding -Trend CBC -Lovenox  for VTE Prophylaxis  -Transfuse for Hgb <7 -Transfuse Platelets for Platelet count <10 K, <50 K with active bleeding, or <100 K with Neurosurgical procedures -Peripheral smear with normal platelet morphology, 4T score is 4 ~ will rule out HIT, send HIT antibody and serotonin release assay ~ will d/c Lovenox  and switch back to home Eliquis  for Anticoagulation  #Diabetes Mellitus Type II #Hypothyroidism -CBG's q4h; Target range of 140 to 180 -SSI -Follow ICU Hypo/Hyperglycemia protocol -Continue Synthroid   #Acute Metabolic Encephalopathy ~ IMPROVING -Treatment of sepsis and metabolic derangements as outlined above -Provide supportive care -Promote normal sleep/wake cycle and family presence -Avoid sedating medications as able      Patient is critically ill with severe sepsis with developing septic shock and multiorgan failure.  Given current critical illness superimposed on multiple chronic comorbidities  and advanced age, prognosis is extremely guarded.  Patient is DNR/DNI.  May need to consult palliative care depending on clinical course.   Best Practice (right click and Reselect all SmartList Selections daily)   Diet/type: Regular diet DVT prophylaxis: Eliquis  GI prophylaxis: N/A Lines: Femoral central line, radial A-line ~ will remove on 1/6 as both are no longer needed Foley:  Yes, and it is still needed Code Status:  DNR Last date of multidisciplinary goals of care discussion [1/6]  1/6: Pt updated at bedside. Will update patient's son when he arrives at bedside.  Labs   CBC: Recent Labs  Lab 04/27/23 0350 04/27/23 1642 04/28/23 0330 04/29/23 0410 04/30/23 0440  WBC 59.6* 57.8* 43.9* 51.3* 12.8*  NEUTROABS 52.4* 50.1* 38.3* 48.4* 10.8*  HGB 12.4 11.9* 11.3* 11.1* 10.7*  HCT 35.9* 34.9* 32.0* 31.9* 31.7*  MCV 99.4 99.4  97.9 97.0 100.3*  PLT 116* 101* 84* 91* 66*    Basic Metabolic Panel: Recent Labs  Lab 04/26/23 2028 04/27/23 0350 04/28/23 0330 04/29/23 0410 04/30/23 0440  NA 132* 132* 133* 134* 138  K 3.7 3.9 4.0 3.6 3.9  CL 99 98 98 97* 100  CO2 14* 19* 20* 25 28  GLUCOSE 224* 177* 161* 293* 111*  BUN 35* 39* 53* 65* 53*  CREATININE 2.23* 2.11* 1.48* 1.22* 0.76  CALCIUM  7.8* 7.5* 7.4* 7.7* 7.9*  MG  --  1.7 1.8 2.4 2.2  PHOS  --  3.4  --   --  2.1*   GFR: Estimated Creatinine Clearance: 44.7 mL/min (by C-G formula based on SCr of 0.76 mg/dL). Recent Labs  Lab 04/26/23 0606 04/26/23 1024 04/27/23 0350 04/27/23 0505 04/27/23 1642 04/27/23 1925 04/28/23 0330 04/28/23 1115 04/29/23 0410 04/29/23 0802 04/30/23 0440  PROCALCITON 21.90  --  53.96  --   --   --  32.98  --  19.51  --   --   WBC  --    < > 59.6*  --  57.8*  --  43.9*  --  51.3*  --  12.8*  LATICACIDVEN  --    < >  --    < > 4.3* 4.0*  --  3.4*  --  3.3*  --    < > = values in this interval not displayed.    Liver Function Tests: Recent Labs  Lab 04/25/23 1803 04/27/23 0350  04/28/23 0330 04/29/23 0410 04/30/23 0440  AST 43* 137* 82* 46* 37  ALT 15 27 18 14 13   ALKPHOS 99 166* 124 140* 125  BILITOT 1.0 1.7* 1.1 0.8 0.7  PROT 7.7 6.2* 5.6* 5.6* 5.2*  ALBUMIN 3.1* 2.3* 2.1* 2.0* 1.9*   Recent Labs  Lab 04/25/23 1803  LIPASE 28   No results for input(s): AMMONIA in the last 168 hours.  ABG    Component Value Date/Time   PHART 7.39 04/26/2023 2028   PCO2ART 24 (L) 04/26/2023 2028   PO2ART 73 (L) 04/26/2023 2028   HCO3 14.5 (L) 04/26/2023 2028   ACIDBASEDEF 8.6 (H) 04/26/2023 2028   O2SAT 97.8 04/26/2023 2028     Coagulation Profile: Recent Labs  Lab 04/26/23 0506  INR 2.6*    Cardiac Enzymes: No results for input(s): CKTOTAL, CKMB, CKMBINDEX, TROPONINI in the last 168 hours.  HbA1C: Hgb A1c MFr Bld  Date/Time Value Ref Range Status  04/26/2023 05:06 AM 8.0 (H) 4.8 - 5.6 % Final    Comment:    (NOTE)         Prediabetes: 5.7 - 6.4         Diabetes: >6.4         Glycemic control for adults with diabetes: <7.0   10/10/2020 08:10 AM 7.8 (H) 4.8 - 5.6 % Final    Comment:    (NOTE)         Prediabetes: 5.7 - 6.4         Diabetes: >6.4         Glycemic control for adults with diabetes: <7.0     CBG: Recent Labs  Lab 04/29/23 1600 04/29/23 1955 04/30/23 0006 04/30/23 0431 04/30/23 0743  GLUCAP 161* 127* 100* 95 116*    Review of Systems:   Positives in BOLD: Gen: Denies fever, chills, weight change, fatigue, night sweats HEENT: Denies blurred vision, double vision, hearing loss, tinnitus, sinus congestion, rhinorrhea, sore throat, neck stiffness, dysphagia PULM:  Denies shortness of breath, cough, sputum production, hemoptysis, wheezing CV: Denies chest pain, edema, orthopnea, paroxysmal nocturnal dyspnea, palpitations GI: Denies abdominal pain, nausea, vomiting, diarrhea, hematochezia, melena, constipation, change in bowel habits GU: Denies dysuria, hematuria, polyuria, oliguria, urethral discharge Endocrine:  Denies hot or cold intolerance, polyuria, polyphagia or appetite change Derm: Denies rash, dry skin, scaling or peeling skin change Heme: Denies easy bruising, bleeding, bleeding gums Neuro: Denies headache, numbness, weakness, slurred speech, loss of memory or consciousness    Past Medical History:  She,  has a past medical history of Allergy, Anxiety, Balance disorder, Chronic diastolic CHF (congestive heart failure) (HCC), Colon polyps, Coronary artery disease, non-occlusive, DM2 (diabetes mellitus, type 2) (HCC), Fall, Falls frequently, GERD (gastroesophageal reflux disease), Granuloma annulare, History of chicken pox, History of eating disorder, HLD (hyperlipidemia), HTN (hypertension), Hypothyroidism, IBS (irritable bowel syndrome), Incontinence of bowel, Osteopenia, Osteoporosis, Ovarian cancer (HCC), Persistent atrial fibrillation (HCC), SBO (small bowel obstruction) (HCC), TIA (transient ischemic attack), UTI (urinary tract infection), Venous insufficiency, and Ventral hernia.   Surgical History:   Past Surgical History:  Procedure Laterality Date   2nd look laparotomy  1982   ABDOMINAL HYSTERECTOMY     for ovarian cancer s/p hysterectomy total 1976 and exp lap 1980   APPENDECTOMY     BACK SURGERY     CARDIAC CATHETERIZATION  8/05   neg; a fib found    CARPAL TUNNEL RELEASE Left 03/06/2018   Procedure: CARPAL TUNNEL RELEASE ENDOSCOPIC;  Surgeon: Edie Norleen PARAS, MD;  Location: [facility];  Service: Orthopedics;  Laterality: Left;  diabetic - oral meds   CARPAL TUNNEL RELEASE     left CTS Dr.Poggi   cataract OD  2002   cataract surgery  5/07   R   CHOLECYSTECTOMY  1986   CYSTOSCOPY WITH RETROGRADE PYELOGRAM, URETEROSCOPY AND STENT PLACEMENT Right 04/25/2023   Procedure: CYSTOSCOPY WITH RETROGRADE PYELOGRAM, URETEROSCOPY AND STENT PLACEMENT;  Surgeon: Selma Donnice SAUNDERS, MD;  Location: [facility];  Service: Urology;  Laterality: Right;   DEXA  9/04 and 1/02   EGD/dilation/colon   1/06   ESOPHAGOGASTRODUODENOSCOPY (EGD) WITH PROPOFOL  N/A 05/16/2016   Procedure: ESOPHAGOGASTRODUODENOSCOPY (EGD) WITH PROPOFOL  with dilation;  Surgeon: Rogelia Copping, MD;  Location: [facility];  Service: Endoscopy;  Laterality: N/A;   FEMORAL HERNIA REPAIR  1958   R   fracture L elbow/wrist  2000   INTRAMEDULLARY (IM) NAIL INTERTROCHANTERIC Left 10/12/2020   Procedure: INTRAMEDULLARY (IM) NAIL INTERTROCHANTRIC;  Surgeon: Krasinski, Kevin, MD;  Location: [facility];  Service: Orthopedics;  Laterality: Left;   intussception/obstruction  12/98   JOINT REPLACEMENT     left shoulder   kidney stone x2  1991   KYPHOPLASTY N/A 04/13/2020   Procedure: [id number];  Surgeon: Kathlynn Sharper, MD;  Location: [facility];  Service: Orthopedics;  Laterality: N/A;   KYPHOPLASTY N/A 05/18/2020   Procedure: L1 KYPHOPLASTY;  Surgeon: Kathlynn Sharper, MD;  Location: [facility];  Service: Orthopedics;  Laterality: N/A;   KYPHOPLASTY N/A 07/06/2020   Procedure: L4  KYPHOPLASTY;  Surgeon: Kathlynn Sharper, MD;  Location: [facility];  Service: Orthopedics;  Laterality: N/A;   laminectomy L4-5  1971   LUMBAR LAMINECTOMY     1970 ruptured disc    MOUTH SURGERY     myoview  stress  8/05   syncope (-)   REVERSE SHOULDER ARTHROPLASTY Left 10/06/2014   Procedure: REVERSE SHOULDER ARTHROPLASTY;  Surgeon: Norleen PARAS Edie, MD;  Location: [facility];  Service: Orthopedics;  Laterality: Left;  stillbirth  1969   TOTAL ABDOMINAL HYSTERECTOMY W/ BILATERAL SALPINGOOPHORECTOMY  1980   ovarian cancer   WRIST FRACTURE SURGERY  11/05   R     Social History:   reports that she has never smoked. She has never used smokeless tobacco. She reports that she does not drink alcohol  and does not use drugs.   Family History:  Her family history includes Arthritis in her brother, brother, and sister; COPD in her brother; Cancer in her brother, brother, and brother; Depression in her brother and sister; Diabetes in her brother, mother, and paternal  grandfather; Early death in her brother, brother, and father; Hearing loss in her brother, brother, brother, daughter, sister, and sister; Heart disease in her brother, mother, sister, sister, and sister; Hyperlipidemia in her brother; Hypertension in her daughter and sister; Stroke in her sister.   Allergies Allergies  Allergen Reactions   Amiodarone  Other (See Comments)    Severe Thyroid  issues    Fosamax [Alendronate]     dysphagia   Penicillins Anaphylaxis and Other (See Comments)    TOLERATED CEFAZOLIN  07/06/20 Has patient had a PCN reaction causing immediate rash, facial/tongue/throat swelling, SOB or lightheadedness with hypotension: Yes Has patient had a PCN reaction causing severe rash involving mucus membranes or skin necrosis: No Has patient had a PCN reaction that required hospitalization: No Has patient had a PCN reaction occurring within the last 10 years: No If all of the above answers are NO, then may proceed with Cephalosporin use.    Sulfa Antibiotics Itching and Rash    Itching    Actos [Pioglitazone]     Not effective    Amaryl [Glimepiride]     Not effective     Atorvastatin Other (See Comments)    Muscle aches & All statins per Patient   Bentyl  [Dicyclomine  Hcl]     Could not tolerate nausea, reduced concentration, h/a    Ciprofloxacin  Other (See Comments)    High blood pressure     Codeine Nausea And Vomiting   Ezetimibe-Simvastatin Other (See Comments)    Muscle aches, nausea, back pain    Lovastatin Other (See Comments)    Myalgias   Macrobid  [Nitrofurantoin  Macrocrystal]     Severe Itching, rash    Protonix  [Pantoprazole  Sodium]     Esophageal problems  Wants removed from list   Rosiglitazone Maleate Other (See Comments)    Edema   Statins     Muscle and joint aches to all statins   Victoza [Liraglutide]     Nausea    Alendronate Sodium Rash    Dysphagia and ulceration    Bactrim [Sulfamethoxazole-Trimethoprim] Rash    itching      Home Medications  Prior to Admission medications   Medication Sig Start Date End Date Taking? Authorizing Provider  acetaminophen  (TYLENOL ) 500 MG tablet Take 1,000 mg by mouth every 6 (six) hours as needed.   Yes [provider]  alum & mag hydroxide-simeth (MAALOX/MYLANTA) 200-200-20 MG/5ML suspension Take 30 mLs by mouth every 4 (four) hours as needed for indigestion. 10/20/20  Yes Fausto Sor A, DO  apixaban  (ELIQUIS ) 5 MG TABS tablet Take 5 mg by mouth 2 (two) times daily.   Yes [provider]  Ascorbic Acid  (VITAMIN C ) 1000 MG tablet Take 2,000 mg by mouth daily.   Yes [provider]  bisoprolol  (ZEBETA ) 5 MG tablet Take 5 mg by mouth in the morning and at bedtime.   Yes [provider]  CALCIUM  ANTACID  EXTRA STRENGTH 750 MG chewable tablet Chew by mouth. 08/22/21  Yes [provider]  diclofenac Sodium (VOLTAREN) 1 % GEL Apply 2 g topically in the morning, at noon, and at bedtime.   Yes [provider]  estradiol  (ESTRACE ) 0.1 MG/GM vaginal cream APPLY 0.5MG  (PEA SIZED AMOUNT) JUST INSIDE THE VAGINA WITH FINGER-TIP ON MONDAY, WEDNESDAY AND FRIDAY NIGHTS. 07/21/20  Yes McGowan, Clotilda A, PA-C  famotidine  (PEPCID ) 20 MG tablet Take 20 mg by mouth at bedtime. 12/29/21  Yes [provider]  fluticasone  (FLONASE ) 50 MCG/ACT nasal spray Place 1 spray into both nostrils daily.   Yes [provider]  folic acid  (FOLVITE ) 1 MG tablet Take 1 tablet (1 mg total) by mouth daily. Patient taking differently: Take 1 mg by mouth every other day. 10/21/20  Yes Fausto Sor A, DO  furosemide  (LASIX ) 20 MG tablet Take 1 tablet (20 mg total) by mouth 2 (two) times daily. 10/05/20  Yes Gollan, Timothy J, MD  hydrOXYzine  (ATARAX ) 25 MG tablet Take 25 mg by mouth every 8 (eight) hours as needed for anxiety. 04/25/23 05/09/23 Yes [provider]  insulin  aspart (NOVOLOG ) 100 UNIT/ML injection Inject 0-15 Units into the skin 4 (four)  times daily -  before meals and at bedtime. Patient taking differently: Inject 0-15 Units into the skin 4 (four) times daily. 200-249=2u, 250-299=4u, 300-349=6u, 350-399=8u, 400-449=10u, 450-499=12u, if 500 or greater=call MD 10/20/20  Yes Fausto Sor A, DO  insulin  glargine (LANTUS ) 100 UNIT/ML injection Inject 0.1 mLs (10 Units total) into the skin daily. Patient taking differently: Inject 8 Units into the skin daily. 10/21/20  Yes Fausto Sor A, DO  levothyroxine  (SYNTHROID , LEVOTHROID) 50 MCG tablet Take 50 mcg by mouth daily before breakfast.   Yes [provider]  magnesium  hydroxide (MILK OF MAGNESIA) 400 MG/5ML suspension Take 30 mLs by mouth daily as needed for mild constipation. 10/20/20  Yes Fausto Sor A, DO  Magnesium  Oxide 250 MG TABS Take 250 mg by mouth daily.   Yes [provider]  Multiple Vitamins-Minerals (HEALTHY EYES SUPERVISION 2) CAPS Take 1 capsule by mouth 2 (two) times daily.   Yes [provider]  Multiple Vitamins-Minerals (MULTIVITAMIN GUMMIES ADULT PO) Take 1 each by mouth daily.   Yes [provider]  nystatin powder Apply 1 Application topically 3 (three) times daily as needed.   Yes [provider]  Olopatadine  HCl 0.2 % SOLN Place 1 drop into both eyes daily as needed (Allergies).   Yes [provider]  Omega-3 Fatty Acids (FISH OIL) 1000 MG CAPS Take 1 capsule by mouth 2 (two) times daily.   Yes [provider]  ondansetron  (ZOFRAN ) 4 MG tablet Take 1 tablet (4 mg total) by mouth every 6 (six) hours as needed for nausea. 10/20/20  Yes Fausto Sor A, DO  pyridOXINE  (B-6) 50 MG tablet Take 1 tablet (50 mg total) by mouth daily. 10/21/20  Yes Fausto Sor A, DO  TRADJENTA  5 MG TABS tablet Take 5 mg by mouth daily. 10/05/20  Yes [provider]  vitamin B-12 (CYANOCOBALAMIN ) 1000 MCG tablet Take 1,000 mcg by mouth daily. W/ Folate and B6 sublingual   Yes [provider]   aspirin  325 MG tablet Take 325 mg by mouth daily. Patient not taking: Reported on 04/25/2023 12/29/21   [provider]  bacitracin 500 UNIT/GM ointment Apply 1 Application topically daily. Patient not taking: Reported on 12/21/2022    [provider]  docusate sodium  (COLACE)  100 MG capsule Take 100 mg by mouth daily as needed for mild constipation. Patient not taking: Reported on 12/21/2022    [provider]  glucose blood (ONE TOUCH ULTRA TEST) test strip Use to test blood sugar once daily E11.49 06/24/15   Bedsole, Amy E, MD  insulin  glargine-yfgn (SEMGLEE ) 100 UNIT/ML Pen Inject into the skin. Patient not taking: Reported on 08/25/2021 02/25/21   [provider]  naproxen  sodium (ALEVE ) 220 MG tablet Take 220 mg by mouth daily as needed. Patient not taking: Reported on 04/25/2023    [provider]  psyllium (METAMUCIL) 58.6 % packet Take 1 packet by mouth daily as needed (constipation). Patient not taking: Reported on 04/25/2023    [provider]  rivaroxaban  (XARELTO ) 20 MG TABS tablet Take 1 tablet (20 mg total) by mouth daily with supper. Patient not taking: Reported on 12/21/2022 01/04/22   Abigail Bernardino HERO, PA-C  simethicone (MYLICON) 80 MG chewable tablet Chew 80 mg by mouth every 6 (six) hours as needed for flatulence. Patient not taking: Reported on 04/25/2023    [provider]  Skin Protectants, Misc. (DIMETHICONE-ZINC OXIDE) cream Apply topically 2 (two) times daily as needed for dry skin. Please label the bottle for the vaginal area only. Patient not taking: Reported on 04/25/2023 06/29/20   Helon Kirsch A, PA-C  SODIUM FLUORIDE 5000 PPM 1.1 % GEL dental gel Take by mouth. Patient not taking: Reported on 04/25/2023 05/10/21   [provider]     Critical care time: 40 minutes     Inge Lecher, AGACNP-BC [facility] Prefer epic messenger for cross cover needs If after hours, please call  E-link

## 2023-04-30 NOTE — Progress Notes (Addendum)
 PHARMACY - ANTICOAGULATION CONSULT NOTE  Pharmacy Consult for apixaban  Indication: atrial fibrillation  Allergies  Allergen Reactions   Amiodarone  Other (See Comments)    Severe Thyroid  issues    Fosamax [Alendronate]     dysphagia   Penicillins Anaphylaxis and Other (See Comments)    TOLERATED CEFAZOLIN  07/06/20 Has patient had a PCN reaction causing immediate rash, facial/tongue/throat swelling, SOB or lightheadedness with hypotension: Yes Has patient had a PCN reaction causing severe rash involving mucus membranes or skin necrosis: No Has patient had a PCN reaction that required hospitalization: No Has patient had a PCN reaction occurring within the last 10 years: No If all of the above answers are NO, then may proceed with Cephalosporin use.    Sulfa Antibiotics Itching and Rash    Itching    Actos [Pioglitazone]     Not effective    Amaryl [Glimepiride]     Not effective     Atorvastatin Other (See Comments)    Muscle aches & All statins per Patient   Bentyl  [Dicyclomine  Hcl]     Could not tolerate nausea, reduced concentration, h/a    Ciprofloxacin  Other (See Comments)    High blood pressure     Codeine Nausea And Vomiting   Ezetimibe-Simvastatin Other (See Comments)    Muscle aches, nausea, back pain    Lovastatin Other (See Comments)    Myalgias   Macrobid  [Nitrofurantoin  Macrocrystal]     Severe Itching, rash    Protonix  [Pantoprazole  Sodium]     Esophageal problems  Wants removed from list   Rosiglitazone Maleate Other (See Comments)    Edema   Statins     Muscle and joint aches to all statins   Victoza [Liraglutide]     Nausea    Alendronate Sodium Rash    Dysphagia and ulceration    Bactrim [Sulfamethoxazole-Trimethoprim] Rash    itching    Patient Measurements: Height: 5' 4 (162.6 cm) Weight: 79.1 kg (174 lb 6.1 oz) IBW/kg (Calculated) : 54.7  Vital Signs: Temp: 97.5 F (36.4 C) (01/06 0800) Temp Source: Axillary (01/06 0800) Pulse  Rate: 86 (01/06 0800)  Labs: Recent Labs    04/28/23 0330 04/29/23 0410 04/30/23 0440  HGB 11.3* 11.1* 10.7*  HCT 32.0* 31.9* 31.7*  PLT 84* 91* 66*  CREATININE 1.48* 1.22* 0.76    Estimated Creatinine Clearance: 44.7 mL/min (by C-G formula based on SCr of 0.76 mg/dL).   Medical History: Past Medical History:  Diagnosis Date   Allergy    Anxiety    Balance disorder    Chronic diastolic CHF (congestive heart failure) (HCC)    a. 10/2018 Echo: EF 55-60%, diast dysfxn, nl RV fxn. RVSP 45.57mmHg. Mild-mod TR. Mod dil LA.   Colon polyps    Coronary artery disease, non-occlusive    a. LHC 11/2003: 10% LAD stenosis, 20% LCx stenosis, and 40% RCA stenosis; b. nuc stress test 12/15: small region of mild ischemia in the apical region with WMA also noted in the apical region, EF 60%. 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 CHADS2VASc = > 8 (CHF, HTN, age x 2, DM, TIA x 2, female); b. on Xarelto    SBO (small bowel obstruction) (HCC)    1998   TIA (transient ischemic attack)    UTI (urinary tract infection)    Venous insufficiency    Ventral hernia     Medications:  Scheduled:   Chlorhexidine  Gluconate Cloth  6 each Topical Daily   insulin  aspart  0-15 Units Subcutaneous Q4H   insulin  glargine-yfgn  8 Units Subcutaneous Daily   melatonin  5 mg Oral QHS   mupirocin  ointment   Nasal  BID    Assessment: 89 yo female with medical history significant for hypothyroidism, HTN, afib, CHF, DM who presented to the emergency room with AMS. Pharmacy has been consulted for apixaban .   Goal of Therapy:  Monitor platelets by anticoagulation protocol: Yes   Plan:  ---start apixaban  5 mg po BID (meets only 1/3 dose-reduction criteria) ---CBC at least once weekly per CHG protocol  Jodi Reeves 04/30/2023,11:07 AM

## 2023-04-30 NOTE — Evaluation (Signed)
 Occupational Therapy Evaluation Patient Details Name: Jodi Reeves MRN: [id number] DOB: 25-Jan-1928 Today's Date: 04/30/2023   History of Present Illness Patient is a 89 year old female with acute metabolic encephalopathy with severe sepsis and right ureteral stone infection s/p cystoscopy and stent placement. Complicated by septic shock. Patient has history of anxiety, CAD, CHF, GERD, HTN, dyslipidemia, hypothyroidism, TIA, IBS   Clinical Impression   Chart reviewed, pt greeted in room, alert, oriented to self and place, agreeable to OT evaluation. Co tx completed with PT. PTA per chart ,pt is from [facility], has assist for ADL/IADL, transfers to [facility] where she performs wheelchair mobility with MOD I per her report. Pt presents with deficits in strength, activity tolerance, balance, endurance affecting safe and optimal ADL completion. Please refer to further details below. Pt will benefit from acute OT to address deficits and to facilitate optimal ADL performance.       If plan is discharge home, recommend the following: A lot of help with walking and/or transfers;A lot of help with bathing/dressing/bathroom;Assistance with cooking/housework;Direct supervision/assist for medications management;Supervision due to cognitive status;Help with stairs or ramp for entrance;Direct supervision/assist for financial management    Functional Status Assessment  Patient has had a recent decline in their functional status and demonstrates the ability to make significant improvements in function in a reasonable and predictable amount of time.  Equipment Recommendations  Other (comment) (defer to next venue of care)    Recommendations for Other Services       Precautions / Restrictions Precautions Precautions: Fall Precaution Comments: A-line LUE Restrictions Weight Bearing Restrictions Per Provider Order: No      Mobility Bed Mobility Overal bed mobility: Needs Assistance Bed Mobility: Supine to  Sit, Sit to Supine     Supine to sit: Mod assist, +2 for physical assistance Sit to supine: Max assist, +2 for physical assistance   General bed mobility comments: frequent vcs for sequencing    Transfers                   General transfer comment: not assessed due to generalized weakness and fatigue with dizziness reported; MAP In 80s      Balance Overall balance assessment: Needs assistance Sitting-balance support: Feet supported Sitting balance-Leahy Scale: Poor                                     ADL either performed or assessed with clinical judgement   ADL Overall ADL's : Needs assistance/impaired Eating/Feeding: Supervision/ safety;Sitting Eating/Feeding Details (indicate cue type and reason): with with delayed cough after drinking water , nurse notified             Upper Body Dressing : Moderate assistance;Bed level Upper Body Dressing Details (indicate cue type and reason): anticipate Lower Body Dressing: Maximal assistance;Bed level       Toileting- Clothing Manipulation and Hygiene: Maximal assistance               Vision Patient Visual Report: No change from baseline Additional Comments: will continue to assess     Perception         Praxis         Pertinent Vitals/Pain Pain Assessment Pain Assessment: No/denies pain     Extremity/Trunk Assessment Upper Extremity Assessment Upper Extremity Assessment: Generalized weakness   Lower Extremity Assessment Lower Extremity Assessment: Generalized weakness       Communication Communication Communication:  Difficulty following commands/understanding Following commands: Follows one step commands with increased time Cueing Techniques: Verbal cues   Cognition Arousal: Alert Behavior During Therapy: WFL for tasks assessed/performed Overall Cognitive Status: Impaired/Different from baseline Area of Impairment: Following commands, Awareness, Problem solving,  Orientation                 Orientation Level: Disoriented to, Situation     Following Commands: Follows one step commands with increased time   Awareness: Emergent Problem Solving: Slow processing, Decreased initiation, Difficulty sequencing, Requires verbal cues       General Comments  nausea with edge of bed mobility; lines/leads, aline intact pre/post session    Exercises Other Exercises Other Exercises: edu re: role of OT, role of rehab   Shoulder Instructions      Home Living Family/patient expects to be discharged to:: Skilled nursing facility                                 Additional Comments: long term resident at [facility]      Prior Functioning/Environment Prior Level of Function : Needs assist             Mobility Comments: pt reports staff assist for transfer to [facility], MOD I with [facility] mobility around facility ADLs Comments: pt reports intermittent assist for dressing, bathing, toileting, set up for grooming, feeding; assist for IADLs        OT Problem List: Decreased strength;Decreased activity tolerance;Decreased knowledge of use of DME or AE;Decreased safety awareness;Decreased cognition;Impaired balance (sitting and/or standing)      OT Treatment/Interventions: Self-care/ADL training;Therapeutic exercise;Patient/family education;Balance training;Energy conservation;Therapeutic activities;DME and/or AE instruction;Cognitive remediation/compensation    OT Goals(Current goals can be found in the care plan section) Acute Rehab OT Goals Patient Stated Goal: improve strength OT Goal Formulation: With patient Time For Goal Achievement: 05/14/23 Potential to Achieve Goals: Good ADL Goals Pt Will Perform Grooming: with supervision;sitting Pt Will Perform Lower Body Dressing: with mod assist;sitting/lateral leans;sit to/from stand Pt Will Transfer to Toilet: with min assist;stand pivot transfer Pt Will Perform Toileting - Clothing  Manipulation and hygiene: with mod assist;sitting/lateral leans;sit to/from stand  OT Frequency: Min 1X/week    Co-evaluation PT/OT/SLP Co-Evaluation/Treatment: Yes Reason for Co-Treatment: Complexity of the patient's impairments (multi-system involvement);To address functional/ADL transfers PT goals addressed during session: Mobility/safety with mobility OT goals addressed during session: ADL's and self-care      AM-PAC OT 6 Clicks Daily Activity     Outcome Measure Help from another person eating meals?: A Little Help from another person taking care of personal grooming?: A Little Help from another person toileting, which includes using toliet, bedpan, or urinal?: A Lot Help from another person bathing (including washing, rinsing, drying)?: A Lot Help from another person to put on and taking off regular upper body clothing?: A Lot Help from another person to put on and taking off regular lower body clothing?: A Lot 6 Click Score: 14   End of Session Equipment Utilized During Treatment: Rolling walker (2 wheels) Nurse Communication: Mobility status  Activity Tolerance: Patient tolerated treatment well Patient left: in bed;with call bell/phone within reach;with bed alarm set;with nursing/sitter in room  OT Visit Diagnosis: Other abnormalities of gait and mobility (R26.89);Muscle weakness (generalized) (M62.81)                Time: 0900-0930 OT Time Calculation (min): 30 min Charges:  OT General Charges $OT  Visit: 1 Visit OT Evaluation $OT Eval Moderate Complexity: 1 Mod  Therisa Sheffield, OTD OTR/L  04/30/23, 12:41 PM

## 2023-04-30 NOTE — Progress Notes (Signed)
 PHARMACY CONSULT NOTE - FOLLOW UP  Pharmacy Consult for Electrolyte Monitoring and Replacement   Recent Labs: Potassium (mmol/L)  Date Value  04/30/2023 3.9   Magnesium  (mg/dL)  Date Value  98/93/7974 2.2   Calcium  (mg/dL)  Date Value  98/93/7974 7.9 (L)   Albumin (g/dL)  Date Value  98/93/7974 1.9 (L)  09/26/2019 4.3   Phosphorus (mg/dL)  Date Value  98/93/7974 2.1 (L)   Sodium (mmol/L)  Date Value  04/30/2023 138  09/14/2020 139     Assessment: 89 yo female with medical history significant for hypothyroidism, HTN, afib, CHF, DM who presented to the emergency room with AMS. Pharmacy is asked to follow and replace electrolytes while in CCU  Goal of Therapy:  Electrolytes WNL  Plan:  ---phosphorous 500 mg po x 1 ---recheck electrolytes in am  Jodi Reeves ,PharmD Clinical Pharmacist 04/30/2023 7:11 AM

## 2023-05-01 DIAGNOSIS — N139 Obstructive and reflux uropathy, unspecified: Secondary | ICD-10-CM | POA: Diagnosis not present

## 2023-05-01 LAB — CBC WITH DIFFERENTIAL/PLATELET
Abs Immature Granulocytes: 0.05 10*3/uL (ref 0.00–0.07)
Basophils Absolute: 0 10*3/uL (ref 0.0–0.1)
Basophils Relative: 0 %
Eosinophils Absolute: 0.1 10*3/uL (ref 0.0–0.5)
Eosinophils Relative: 1 %
HCT: 32.3 % — ABNORMAL LOW (ref 36.0–46.0)
Hemoglobin: 10.8 g/dL — ABNORMAL LOW (ref 12.0–15.0)
Immature Granulocytes: 1 %
Lymphocytes Relative: 23 %
Lymphs Abs: 1.3 10*3/uL (ref 0.7–4.0)
MCH: 33.5 pg (ref 26.0–34.0)
MCHC: 33.4 g/dL (ref 30.0–36.0)
MCV: 100.3 fL — ABNORMAL HIGH (ref 80.0–100.0)
Monocytes Absolute: 0.4 10*3/uL (ref 0.1–1.0)
Monocytes Relative: 7 %
Neutro Abs: 3.7 10*3/uL (ref 1.7–7.7)
Neutrophils Relative %: 68 %
Platelets: 62 10*3/uL — ABNORMAL LOW (ref 150–400)
RBC: 3.22 MIL/uL — ABNORMAL LOW (ref 3.87–5.11)
RDW: 13.3 % (ref 11.5–15.5)
Smear Review: NORMAL
WBC: 5.5 10*3/uL (ref 4.0–10.5)
nRBC: 0 % (ref 0.0–0.2)

## 2023-05-01 LAB — GLUCOSE, CAPILLARY
Glucose-Capillary: 144 mg/dL — ABNORMAL HIGH (ref 70–99)
Glucose-Capillary: 148 mg/dL — ABNORMAL HIGH (ref 70–99)
Glucose-Capillary: 162 mg/dL — ABNORMAL HIGH (ref 70–99)
Glucose-Capillary: 181 mg/dL — ABNORMAL HIGH (ref 70–99)
Glucose-Capillary: 213 mg/dL — ABNORMAL HIGH (ref 70–99)
Glucose-Capillary: 250 mg/dL — ABNORMAL HIGH (ref 70–99)
Glucose-Capillary: 303 mg/dL — ABNORMAL HIGH (ref 70–99)
Glucose-Capillary: 40 mg/dL — CL (ref 70–99)
Glucose-Capillary: 42 mg/dL — CL (ref 70–99)

## 2023-05-01 LAB — COMPREHENSIVE METABOLIC PANEL
ALT: 13 U/L (ref 0–44)
AST: 33 U/L (ref 15–41)
Albumin: 1.9 g/dL — ABNORMAL LOW (ref 3.5–5.0)
Alkaline Phosphatase: 132 U/L — ABNORMAL HIGH (ref 38–126)
Anion gap: 12 (ref 5–15)
BUN: 36 mg/dL — ABNORMAL HIGH (ref 8–23)
CO2: 26 mmol/L (ref 22–32)
Calcium: 7.7 mg/dL — ABNORMAL LOW (ref 8.9–10.3)
Chloride: 102 mmol/L (ref 98–111)
Creatinine, Ser: 0.58 mg/dL (ref 0.44–1.00)
GFR, Estimated: >60 mL/min (ref >60)
Glucose, Bld: 98 mg/dL (ref 70–99)
Potassium: 3.7 mmol/L (ref 3.5–5.1)
Sodium: 140 mmol/L (ref 135–145)
Total Bilirubin: 0.5 mg/dL (ref 0.0–1.2)
Total Protein: 5.2 g/dL — ABNORMAL LOW (ref 6.5–8.1)

## 2023-05-01 LAB — HEPARIN INDUCED PLATELET AB (HIT ANTIBODY): Heparin Induced Plt Ab: 0.131 {OD_unit} (ref 0.000–0.400)

## 2023-05-01 LAB — MAGNESIUM: Magnesium: 1.9 mg/dL (ref 1.7–2.4)

## 2023-05-01 LAB — PHOSPHORUS: Phosphorus: 2.6 mg/dL (ref 2.5–4.6)

## 2023-05-01 MED ORDER — DEXTROSE 50 % IV SOLN: 25.0000 g | INTRAVENOUS | Status: AC

## 2023-05-01 MED ORDER — DEXTROSE 50 % IV SOLN
INTRAVENOUS | Status: AC
  Administered 2023-05-01: 25 g via INTRAVENOUS
  Filled 2023-05-01: qty 50

## 2023-05-01 MED ORDER — METOPROLOL TARTRATE 5 MG/5ML IV SOLN
5.0000 mg | Freq: Once | INTRAVENOUS | Status: AC
  Administered 2023-05-01: 5 mg via INTRAVENOUS
  Filled 2023-05-01: qty 5

## 2023-05-01 NOTE — Progress Notes (Signed)
 Pharmacy Heparin Induced Thrombocytopenia (HIT) Note:  Jodi Reeves is an 89 y.o. female being evaluated for HIT. Heparin was started enoxaparin  for DVT ppx, and baseline platelets were 164.   HIT labs were ordered on 04/30/23 when platelets dropped to 66.  Auto-populate labs: No results found for: HEPINDPLTAB, SRALOWDOSEHP, SRAHIGHDOSEH   CALCULATE SCORE:  4Ts (see the HIT Algorithm) Score  Thrombocytopenia 2  Timing 0  Thrombosis 0  Other causes of thrombocytopenia 0  Total 2     Recommendations (A or B) are based on available lab results (HIT antibody and/or SRA) and the HIT algorithm    A. HIT antibody result available  HIT ruled out   No SRA needed Continue heparin / LMWH No allergy documentation needed   B. SRA result availability  SRA not available   Name of provider contacted: JINNY Lecher, NP  Plan (Discussed with provider) Labs ordered:  SRA not needed  Heparin allergy:  No allergy documentation needed. Anticoagulation plans:  Continue heparin / LMWH   Comments (List any alternative plans or if there are contraindications to therapy)   Jodi Reeves 05/01/2023, 8:40 AM

## 2023-05-01 NOTE — Plan of Care (Signed)
  Problem: Fluid Volume: Goal: Hemodynamic stability will improve Outcome: Not Progressing   Problem: Clinical Measurements: Goal: Diagnostic test results will improve Outcome: Not Progressing Goal: Signs and symptoms of infection will decrease Outcome: Not Progressing   Problem: Respiratory: Goal: Ability to maintain adequate ventilation will improve Outcome: Not Progressing   Problem: Education: Goal: Knowledge of General Education information will improve Description: Including pain rating scale, medication(s)/side effects and non-pharmacologic comfort measures Outcome: Not Progressing   Problem: Health Behavior/Discharge Planning: Goal: Ability to manage health-related needs will improve Outcome: Not Progressing   Problem: Clinical Measurements: Goal: Ability to maintain clinical measurements within normal limits will improve Outcome: Not Progressing Goal: Will remain free from infection Outcome: Not Progressing Goal: Diagnostic test results will improve Outcome: Not Progressing Goal: Respiratory complications will improve Outcome: Not Progressing Goal: Cardiovascular complication will be avoided Outcome: Not Progressing   Problem: Activity: Goal: Risk for activity intolerance will decrease Outcome: Not Progressing   Problem: Nutrition: Goal: Adequate nutrition will be maintained Outcome: Not Progressing   Problem: Coping: Goal: Level of anxiety will decrease Outcome: Not Progressing   Problem: Elimination: Goal: Will not experience complications related to bowel motility Outcome: Not Progressing Goal: Will not experience complications related to urinary retention Outcome: Not Progressing   Problem: Pain Management: Goal: General experience of comfort will improve Outcome: Not Progressing   Problem: Safety: Goal: Ability to remain free from injury will improve Outcome: Not Progressing   Problem: Skin Integrity: Goal: Risk for impaired skin integrity  will decrease Outcome: Not Progressing   Problem: Education: Goal: Ability to describe self-care measures that may prevent or decrease complications (Diabetes Survival Skills Education) will improve Outcome: Not Progressing Goal: Individualized Educational Video(s) Outcome: Not Progressing   Problem: Coping: Goal: Ability to adjust to condition or change in health will improve Outcome: Not Progressing   Problem: Fluid Volume: Goal: Ability to maintain a balanced intake and output will improve Outcome: Not Progressing   Problem: Health Behavior/Discharge Planning: Goal: Ability to identify and utilize available resources and services will improve Outcome: Not Progressing Goal: Ability to manage health-related needs will improve Outcome: Not Progressing   Problem: Metabolic: Goal: Ability to maintain appropriate glucose levels will improve Outcome: Not Progressing   Problem: Nutritional: Goal: Maintenance of adequate nutrition will improve Outcome: Not Progressing Goal: Progress toward achieving an optimal weight will improve Outcome: Not Progressing   Problem: Skin Integrity: Goal: Risk for impaired skin integrity will decrease Outcome: Not Progressing   Problem: Tissue Perfusion: Goal: Adequacy of tissue perfusion will improve Outcome: Not Progressing

## 2023-05-01 NOTE — Plan of Care (Signed)
  Problem: Fluid Volume: Goal: Hemodynamic stability will improve Outcome: Progressing   Problem: Clinical Measurements: Goal: Diagnostic test results will improve Outcome: Progressing   Problem: Respiratory: Goal: Ability to maintain adequate ventilation will improve Outcome: Progressing   Problem: Health Behavior/Discharge Planning: Goal: Ability to manage health-related needs will improve Outcome: Not Progressing   Problem: Activity: Goal: Risk for activity intolerance will decrease Outcome: Not Progressing

## 2023-05-01 NOTE — Progress Notes (Signed)
 Progress Note   Patient: Jodi Reeves FMW:[id number] DOB: [birth year] DOA: 04/25/2023     6 DOS: the patient was seen and examined on 05/01/2023   Brief hospital course: She is a 89 y.o. female admitted with Acute Metabolic Encephalopathy in setting of Severe Sepsis due to ESBL E. Coli BACTEREMIA due to Right Ureteral Stone infection, status post Cystoscopy and right ureteral stent placement. Course complicated by development of septic shock. She has now been weaned off vasopressors since yesterday, hemodynamically stable. With worsening Thrombocytopenia, ruling out HIT (sent HIT antibody and serotonin assay today), stopped Lovenox  and transitioned back to her home Eliquis  (chronic A.fib). PT/OT consulted today, and removing central and arterial lines. Downgraded to Stepdown, may transfer out to Progressive later this evening. Please let us  know if you have any additional questions or concerns. Thanks!   Assessment and Plan:  #Septic Shock #ESBL E. Coli Bacteremia #AKI #Ureteral Stone   #Septic Shock ~ RESOLVED  Patient has been weaned off vasopressor support Continue current antibiotics   #Severe Sepsis  #ESBL E. Coli BACTEREMIA  #Right Ureteral Stone infection ~ status post Right Ureteral Stent on 1/1 -Monitor fever curve -Trend WBC's & Procalcitonin -Follow cultures as above Continue empiric Meropenem  pending cultures & sensitivities Neurology is following we appreciate input   #Acute Kidney Injury ~ RESOLVED #AG Metabolic Acidosis ~ RESOLVED #Mild Hypokalemia ~ RESOLVED #Mild Hyponatremia ~ RESOLVED -Patient received IV fluid Monitor renal function Continue to monitor and replete electrolytes   #Thrombocytopenia Monitor for any signs of bleeding Monitor platelets levels Transfuse Platelets for Platelet count <10 K, <50 K with active bleeding, or <100 K with Neurosurgical procedures Patient with 4 T-score of 4 Follow-up on HIT antibody tests Lovenox  discontinued Plan of  care discussed with intensivist   #Diabetes Mellitus Type II #Hypothyroidism -CBG's q4h; Target range of 140 to 180 -SSI -Follow ICU Hypo/Hyperglycemia protocol -Continue Synthroid    #Acute Metabolic Encephalopathy ~ IMPROVING -Treatment of sepsis and metabolic derangements as outlined above -Provide supportive care -Promote normal sleep/wake cycle and family presence -Avoid sedating medications as able   #Paroxymal Atrial Fibrillation Lovenox  was discontinued and Eliquis  started in the ICU by intensivist we will continue   Diet/type: Regular diet  DVT prophylaxis: Eliquis   GI prophylaxis: N/A  Code Status:  DNR   Subjective:  Patient seen and examined at bedside this morning Admits to some improvement however not back to her baseline Denies nausea vomiting chest pain or cough  Physical Exam:  General: no acute distress, resting in bed HEENT: [place]/AT, moist mucous membranes, sclera anicteric Neuro: A&O x 3 CV: rrr, s1s2, no murmurs PULM: clear to auscultation bilaterally. No wheezing GI: soft, non-tender, non-distended, BS+ Extremities: warm, no edema Vitals:   05/01/23 1000 05/01/23 1100 05/01/23 1200 05/01/23 1300  BP: 116/80 134/72 (!) 149/89 (!) 140/82  Pulse: 84 93 84 88  Resp: 16 16 15 12   Temp:   98.1 F (36.7 C)   TempSrc:   Oral   SpO2: 95% 96% 98% 98%  Weight:      Height:        Data Reviewed:    Latest Ref Rng & Units 05/01/2023    4:47 AM 04/30/2023    4:40 AM 04/29/2023    4:10 AM  BMP  Glucose 70 - 99 mg/dL 98  888  706   BUN 8 - 23 mg/dL 36  53  65   Creatinine 0.44 - 1.00 mg/dL 9.41  9.23  8.77  Sodium 135 - 145 mmol/L 140  138  134   Potassium 3.5 - 5.1 mmol/L 3.7  3.9  3.6   Chloride 98 - 111 mmol/L 102  100  97   CO2 22 - 32 mmol/L 26  28  25    Calcium  8.9 - 10.3 mg/dL 7.7  7.9  7.7        Latest Ref Rng & Units 05/01/2023    4:47 AM 04/30/2023    4:40 AM 04/29/2023    4:10 AM  CBC  WBC 4.0 - 10.5 K/uL 5.5  12.8  51.3   Hemoglobin  12.0 - 15.0 g/dL 89.1  89.2  88.8   Hematocrit 36.0 - 46.0 % 32.3  31.7  31.9   Platelets 150 - 400 K/uL 62  66  91       Author: Drue ONEIDA Potter, MD 05/01/2023 5:48 PM  For on call review [email or web address].

## 2023-05-01 NOTE — Inpatient Diabetes Management (Signed)
 Inpatient Diabetes Program Recommendations  AACE/ADA: New Consensus Statement on Inpatient Glycemic Control  Target Ranges:  Prepandial:   less than 140 mg/dL      Peak postprandial:   less than 180 mg/dL (1-2 hours)      Critically ill patients:  140 - 180 mg/dL    Latest Reference Range & Units 05/01/23 00:07 05/01/23 04:25 05/01/23 07:53 05/01/23 07:54 05/01/23 08:22  Glucose-Capillary 70 - 99 mg/dL 855 (H)  Novolog  2 units @ 00:14 303 (H)  Novolog  11 units @4 :30 40 (LL) 42 (LL) 162 (H)    Latest Reference Range & Units 05/01/23 04:47  Glucose 70 - 99 mg/dL 98    Latest Reference Range & Units 04/30/23 07:43 04/30/23 09:32 04/30/23 11:27 04/30/23 16:42 04/30/23 20:27  Glucose-Capillary 70 - 99 mg/dL 883 (H)      Semglee  8 units 171 (H)  Novolog  3 units @12 :10 193 (H)  Novolog  3 units @ 17:04 162 (H)  Novolog  3 units @ 20:39   Review of Glycemic Control  Diabetes history: DM2 Outpatient Diabetes medications: Lantus  8 units daily, Tradjenta  5 mg daily, Novolog  0-15 units TID with meals Current orders for Inpatient glycemic control: Semglee  8 units daily, Novolog  0-15 units Q4H  Inpatient Diabetes Program Recommendations:    Insulin : Noted finger stick 303 mg/dl at 5:74 am and lab glucose 98 mg/dl at 5:52 am today. Patient received Novolog  11 units for CBG of 303 mg/dl at 5:69 am and following glucose down to 40 mg/dl at 2:46 am. Question if finger stick CBG was inaccurate at 4:25 am since lab glucose was 98 mg/dl at 5:52 am and prior CBGs have trended fairly okay; which lead to hypoglycemia since patient received Novolog  11 units for correction at 4:30 am.   Thanks, Earnie Gainer, RN, MSN, CDCES Diabetes Coordinator Inpatient Diabetes Program [phone] (Team Pager from 8am to 5pm)

## 2023-05-01 NOTE — Progress Notes (Signed)
 PHARMACY CONSULT NOTE - FOLLOW UP  Pharmacy Consult for Electrolyte Monitoring and Replacement   Recent Labs: Potassium (mmol/L)  Date Value  05/01/2023 3.7   Magnesium  (mg/dL)  Date Value  98/92/7974 1.9   Calcium  (mg/dL)  Date Value  98/92/7974 7.7 (L)   Albumin (g/dL)  Date Value  98/92/7974 1.9 (L)  09/26/2019 4.3   Phosphorus (mg/dL)  Date Value  98/92/7974 2.6   Sodium (mmol/L)  Date Value  05/01/2023 140  09/14/2020 139     Assessment: 89 yo female with medical history significant for hypothyroidism, HTN, afib, CHF, DM who presented to the emergency room with AMS. Pharmacy is asked to follow and replace electrolytes while in CCU  Goal of Therapy:  Electrolytes WNL  Plan:  ---no electrolyte replacement warranted for today ---recheck electrolytes in am  Adriana JONETTA Bolster ,PharmD Clinical Pharmacist 05/01/2023 7:05 AM

## 2023-05-01 NOTE — [place] FL2 (Signed)
 [place]  MEDICAID [id number] LEVEL OF CARE FORM     IDENTIFICATION  Patient Name: Jodi Reeves Birthdate: 13-Aug-1927 Sex: female Admission Date (Current Location): 04/25/2023  [facility] and [place] Number:  Chiropodist and Address:  [facility], [address] [place], [place] [address]      Provider Number: [id number]  Attending Physician Name and Address:  Dorinda Drue ONEIDA, MD  Relative Name and Phone Number:  Milia, Warth)  [phone] [facility])    Current Level of Care: Hospital Recommended Level of Care: Skilled Nursing Facility Prior Approval Number:    Date Approved/Denied:   PASRR Number: [id number]  Discharge Plan:      Current Diagnoses: Patient Active Problem List   Diagnosis Date Noted   Kidney stone 04/30/2023   Septic shock (HCC) 04/26/2023   Type 2 diabetes mellitus with peripheral neuropathy (HCC) 04/26/2023   GERD without esophagitis 04/26/2023   Paroxysmal atrial fibrillation (HCC) 04/26/2023   AKI (acute kidney injury) (HCC) 04/26/2023   Hypokalemia 04/26/2023   E coli bacteremia 04/26/2023   Acute unilateral obstructive uropathy 04/25/2023   Closed intertrochanteric fracture of hip, left, initial encounter (HCC)    Acute blood loss anemia 10/11/2020   Thrombocytopenia (HCC) 10/11/2020   Closed left hip fracture (HCC) 10/10/2020   UTI (urinary tract infection) 04/10/2020   Abdominal pain 04/09/2020   Hypertension associated with diabetes (HCC) 03/15/2020   Obesity (BMI 30-39.9) 03/15/2020   Coronary artery disease involving native coronary artery of native heart without angina pectoris 03/15/2020   PAD (peripheral artery disease) (HCC) 08/14/2019   Foot ulcer (HCC) 06/19/2019   Lymphedema 06/09/2019   Chronic venous insufficiency 06/09/2019   Diabetic retinopathy associated with type 2 diabetes mellitus (HCC) 01/20/2019   Low back pain 09/24/2018   Overactive bladder 04/10/2018   History of UTI 04/10/2018    Hematuria 04/10/2018   Diarrhea 04/10/2018   Venous ulcer of leg (HCC) 04/10/2018   Status post endoscopic carpal tunnel release 03/19/2018   History of skin cancer 11/08/2017   Chronic anticoagulation 06/11/2017   Aortic atherosclerosis (HCC) 05/08/2017   Coronary artery disease, non-occlusive 03/29/2017   Persistent atrial fibrillation (HCC) 03/29/2017   Carpal tunnel syndrome 03/28/2017   Cervical radiculitis 03/28/2017   Problems with swallowing and mastication    Esophageal candidiasis (HCC)    Stricture and stenosis of esophagus 05/02/2016   TIA (transient ischemic attack) 06/24/2015   Status post reverse total shoulder replacement, left 10/16/2014   Humeral head fracture 10/06/2014   Chest pain 03/27/2014   Shortness of breath 03/27/2014   Chronic diastolic CHF (congestive heart failure) (HCC) 03/27/2014   Advanced directives, counseling/discussion 02/24/2014   Routine general medical examination at a health care facility 02/17/2013   Diabetes, polyneuropathy (HCC) 02/17/2013   Type 2 diabetes mellitus with diabetic polyneuropathy, without long-term current use of insulin  (HCC) 02/17/2013   Gait instability 04/11/2012   Edema 11/14/2010   OTHER CONSTIPATION 05/31/2007   Ventral hernia 03/15/2007   Ovarian cancer (HCC) 11/20/2006   Hypothyroidism 11/20/2006   Type 2 diabetes mellitus with neurological manifestations, controlled (HCC) 11/20/2006   Essential hypertension 11/20/2006   Permanent atrial fibrillation (HCC) 11/20/2006   Osteoporosis 11/20/2006   Hyperlipidemia due to type 2 diabetes mellitus (HCC) 11/20/2006    Orientation RESPIRATION BLADDER Height & Weight     Self, Place  O2 (Nasal Cannula 2 L) Continent, Indwelling catheter Weight: 174 lb 6.1 oz (79.1 kg) Height:  5' 4 (162.6 cm)  BEHAVIORAL  SYMPTOMS/MOOD NEUROLOGICAL BOWEL NUTRITION STATUS   (None)   Incontinent Diet (carb modified)  AMBULATORY STATUS COMMUNICATION OF NEEDS Skin   Extensive Assist  Verbally (delayed responses) Surgical wounds (Vagina: No dressing.)                       Personal Care Assistance Level of Assistance  Bathing, Feeding, Dressing Bathing Assistance: Maximum assistance Feeding assistance: Limited assistance Dressing Assistance: Maximum assistance     Functional Limitations Info             SPECIAL CARE FACTORS FREQUENCY  PT (By licensed PT), OT (By licensed OT)     PT Frequency: 5 x week OT Frequency: 5 x week            Contractures      Additional Factors Info  Isolation Precautions Code Status Info: Do not attempt resuscitation (DNR) -DNR-LIMITED -Do Not Intubate/DNI Allergies Info: Amiodarone , Fosamax (Alendronate), Penicillins, Sulfa Antibiotics, Actos (Pioglitazone), Amaryl (Glimepiride), Atorvastatin, Bentyl  (Dicyclomine  Hcl), Ciprofloxacin , Codeine, Ezetimibe-simvastatin, Lovastatin, Macrobid  (Nitrofurantoin  Macrocrystal), Protonix  (Pantoprazole  Sodium), Rosiglitazone Maleate, Statins, Victoza (Liraglutide), Alendronate Sodium, Bactrim (Sulfamethoxazole-trimethoprim)     Isolation Precautions Info: Contact: ESBL, MRSA     Current Medications (05/01/2023):  This is the current hospital active medication list Current Facility-Administered Medications  Medication Dose Route Frequency Provider Last Rate Last Admin   acetaminophen  (TYLENOL ) tablet 650 mg  650 mg Oral Q6H PRN Patsy Lenis, MD       Or   acetaminophen  (TYLENOL ) suppository 650 mg  650 mg Rectal Q6H PRN Patsy Lenis, MD       apixaban  (ELIQUIS ) tablet 5 mg  5 mg Oral BID Grubb, Rodney D, RPH   5 mg at 05/01/23 1013   Chlorhexidine  Gluconate Cloth 2 % PADS 6 each  6 each Topical Daily Patsy Lenis, MD   6 each at 05/01/23 1153   feeding supplement (ENSURE ENLIVE / ENSURE PLUS) liquid 237 mL  237 mL Oral BID BM Dgayli, Khabib, MD   237 mL at 05/01/23 1017   insulin  aspart (novoLOG ) injection 0-15 Units  0-15 Units Subcutaneous Q4H Patsy Lenis, MD   2 Units at  05/01/23 1154   insulin  glargine-yfgn (SEMGLEE ) injection 8 Units  8 Units Subcutaneous Daily Aleskerov, Fuad, MD   8 Units at 04/30/23 0932   levothyroxine  (SYNTHROID ) tablet 50 mcg  50 mcg Oral Q0600 Grubb, Rodney D, RPH   50 mcg at 05/01/23 9370   magnesium  hydroxide (MILK OF MAGNESIA) suspension 30 mL  30 mL Oral Daily PRN Patsy Lenis, MD   30 mL at 04/29/23 1807   melatonin tablet 5 mg  5 mg Oral QHS Kathrene Almarie Bake, NP   5 mg at 04/30/23 2147   meropenem  (MERREM ) 1 g in sodium chloride  0.9 % 100 mL IVPB  1 g Intravenous Q12H Isadora Hose, MD   Stopped at 05/01/23 1050   mupirocin  ointment (BACTROBAN ) 2 %   Nasal BID Aleskerov, Fuad, MD   Given at 05/01/23 1013   ondansetron  (ZOFRAN ) tablet 4 mg  4 mg Oral Q6H PRN Patsy Lenis, MD       Or   ondansetron  (ZOFRAN ) injection 4 mg  4 mg Intravenous Q6H PRN Patsy Lenis, MD       Oral care mouth rinse  15 mL Mouth Rinse PRN Aleskerov, Fuad, MD       traZODone  (DESYREL ) tablet 25 mg  25 mg Oral QHS PRN Patsy Lenis, MD   25 mg  at 04/27/23 2301     Discharge Medications: Please see discharge summary for a list of discharge medications.  Relevant Imaging Results:  Relevant Lab Results:   Additional Information SS #: [id number]  Lauraine JAYSON Carpen, LCSW

## 2023-05-01 NOTE — TOC Progression Note (Signed)
 Transition of Care [facility]) - Progression Note    Patient Details  Name: Jodi Reeves MRN: [id number] Date of Birth: 1928-02-15  Transition of Care [facility]) CM/SW Contact  Lauraine JAYSON Carpen, LCSW Phone Number: 05/01/2023, 3:51 PM  Clinical Narrative:  PT and OT are recommending rehab when patient returns to [facility]. Son is agreeable to this if insurance will approve. SNF admissions coordinator is aware.   Expected Discharge Plan: Long Term Nursing Home Barriers to Discharge: No Barriers Identified  Expected Discharge Plan and Services     Post Acute Care Choice:  ([facility] Long Term Care facility)                                         Social Determinants of Health (SDOH) Interventions SDOH Screenings   Food Insecurity: No Food Insecurity (06/21/2021)  Housing: Low Risk  (06/21/2021)  Transportation Needs: No Transportation Needs (06/21/2021)  Depression (PHQ2-9): Low Risk  (06/21/2021)  Financial Resource Strain: Low Risk  (06/21/2021)  Physical Activity: Insufficiently Active (06/18/2020)  Social Connections: Unknown (06/21/2021)  Stress: No Stress Concern Present (06/21/2021)  Tobacco Use: Low Risk  (04/25/2023)    Readmission Risk Interventions     No data to display

## 2023-05-02 DIAGNOSIS — N139 Obstructive and reflux uropathy, unspecified: Secondary | ICD-10-CM | POA: Diagnosis not present

## 2023-05-02 LAB — GLUCOSE, CAPILLARY
Glucose-Capillary: 311 mg/dL — ABNORMAL HIGH (ref 70–99)
Glucose-Capillary: 109 mg/dL — ABNORMAL HIGH (ref 70–99)
Glucose-Capillary: 134 mg/dL — ABNORMAL HIGH (ref 70–99)
Glucose-Capillary: 297 mg/dL — ABNORMAL HIGH (ref 70–99)
Glucose-Capillary: 202 mg/dL — ABNORMAL HIGH (ref 70–99)

## 2023-05-02 LAB — CBC
HCT: 33.2 % — ABNORMAL LOW (ref 36.0–46.0)
Hemoglobin: 11.2 g/dL — ABNORMAL LOW (ref 12.0–15.0)
MCH: 33.9 pg (ref 26.0–34.0)
MCHC: 33.7 g/dL (ref 30.0–36.0)
MCV: 100.6 fL — ABNORMAL HIGH (ref 80.0–100.0)
Platelets: 72 10*3/uL — ABNORMAL LOW (ref 150–400)
RBC: 3.3 MIL/uL — ABNORMAL LOW (ref 3.87–5.11)
RDW: 13.4 % (ref 11.5–15.5)
WBC: 5 10*3/uL (ref 4.0–10.5)
nRBC: 0 % (ref 0.0–0.2)

## 2023-05-02 LAB — BASIC METABOLIC PANEL
Anion gap: 10 (ref 5–15)
BUN: 23 mg/dL (ref 8–23)
CO2: 28 mmol/L (ref 22–32)
Calcium: 8.1 mg/dL — ABNORMAL LOW (ref 8.9–10.3)
Chloride: 106 mmol/L (ref 98–111)
Creatinine, Ser: 0.45 mg/dL (ref 0.44–1.00)
GFR, Estimated: >60 mL/min (ref >60)
Glucose, Bld: 78 mg/dL (ref 70–99)
Potassium: 3.6 mmol/L (ref 3.5–5.1)
Sodium: 144 mmol/L (ref 135–145)

## 2023-05-02 LAB — MAGNESIUM: Magnesium: 1.8 mg/dL (ref 1.7–2.4)

## 2023-05-02 LAB — PHOSPHORUS: Phosphorus: 2.6 mg/dL (ref 2.5–4.6)

## 2023-05-02 NOTE — Progress Notes (Signed)
 Called and gave son Jorja Loa an update and informed patient was transferring to room 202.

## 2023-05-02 NOTE — Inpatient Diabetes Management (Signed)
 Inpatient Diabetes Program Recommendations  AACE/ADA: New Consensus Statement on Inpatient Glycemic Control  Target Ranges:  Prepandial:   less than 140 mg/dL      Peak postprandial:   less than 180 mg/dL (1-2 hours)      Critically ill patients:  140 - 180 mg/dL    Latest Reference Range & Units 05/01/23 08:22 05/01/23 11:19 05/01/23 16:15 05/01/23 19:39 05/01/23 23:07 05/02/23 03:34 05/02/23 07:44  Glucose-Capillary 70 - 99 mg/dL 837 (H) 851 (H) 749 (H) 181 (H) 213 (H) 311 (H) 109 (H)    Review of Glycemic Control  Diabetes history: DM2 Outpatient Diabetes medications: Lantus  8 units daily, Tradjenta  5 mg daily, Novolog  0-15 units TID with meals Current orders for Inpatient glycemic control: Semglee  8 units daily, Novolog  0-15 units Q4H  Inpatient Diabetes Program Recommendations:    Insulin : May want to consider ordering Novolog  2 units TID with meals for meal coverage if patient eats at least 50% of meals.  Thanks, Earnie Gainer, RN, MSN, CDCES Diabetes Coordinator Inpatient Diabetes Program [phone] (Team Pager from 8am to 5pm)

## 2023-05-02 NOTE — Progress Notes (Signed)
 Physical Therapy Treatment Patient Details Name: Jodi Reeves MRN: [id number] DOB: 1927-10-29 Today's Date: 05/02/2023   History of Present Illness Patient is a 89 year old female with acute metabolic encephalopathy with severe sepsis and right ureteral stone infection s/p cystoscopy and stent placement. Complicated by septic shock. Patient has history of anxiety, CAD, CHF, GERD, HTN, dyslipidemia, hypothyroidism, TIA, IBS    PT Comments  Patient is agreeable to PT session. Transfer training initiated this session. Patient able to stand x 2 bouts with +2 person assistance. Activity tolerance limited by fatigue. Recommend to continue PT to maximize independence and facilitate return to prior level of function. At baseline, patient completes wheelchair transfers with one person assistance and was modified independent with wheelchair mobility. Rehabilitation < 3 hours/day recommended after this hospital stay.    If plan is discharge home, recommend the following: Two people to help with walking and/or transfers;Assistance with cooking/housework;Help with stairs or ramp for entrance;Assist for transportation;Supervision due to cognitive status;Direct supervision/assist for medications management;Direct supervision/assist for financial management;A lot of help with bathing/dressing/bathroom   Can travel by private vehicle     No  Equipment Recommendations  None recommended by PT    Recommendations for Other Services       Precautions / Restrictions Precautions Precautions: Fall Restrictions Weight Bearing Restrictions Per Provider Order: No     Mobility  Bed Mobility Overal bed mobility: Needs Assistance Bed Mobility: Supine to Sit, Sit to Supine     Supine to sit: Mod assist, Max assist, +2 for physical assistance Sit to supine: Max assist, +2 for physical assistance   General bed mobility comments: verbal cues for sequencing    Transfers Overall transfer level: Needs  assistance   Transfers: Sit to/from Stand             General transfer comment: MinA- Mod A +2 for standing from ICU bed into the [facility] Suite. 2 bouts of standing with lifting and lowering assistance required Transfer via Lift Equipment: Stedy  Ambulation/Gait             Pre-gait activities: emphasis on upright standing posture and increasing standing tolerance. patient reports she is essentially non ambulatory at baseline and performs pivot transfers to and from her wheelchair     Stairs             Wheelchair Mobility     Tilt Bed    Modified Rankin (Stroke Patients Only)       Balance Overall balance assessment: Needs assistance Sitting-balance support: Feet supported Sitting balance-Leahy Scale: Fair     Standing balance support: Bilateral upper extremity supported, During functional activity Standing balance-Leahy Scale: Poor Standing balance comment: external support required                            Cognition Arousal: Alert Behavior During Therapy: WFL for tasks assessed/performed Overall Cognitive Status: No family/caregiver present to determine baseline cognitive functioning Area of Impairment: Following commands, Awareness, Problem solving                       Following Commands: Follows one step commands with increased time     Problem Solving: Slow processing, Decreased initiation, Difficulty sequencing, Requires verbal cues          Exercises      General Comments General comments (skin integrity, edema, etc.): HR up to 140 bpm with STS attempts, 125bpm after returning to  semi supine after tx session, nurse notified, BP and spo2 appear stable throughout.      Pertinent Vitals/Pain Pain Assessment Pain Assessment: No/denies pain    Home Living                          Prior Function            PT Goals (current goals can now be found in the care plan section) Acute Rehab PT Goals PT Goal  Formulation: With patient Time For Goal Achievement: 05/14/23 Potential to Achieve Goals: Fair Progress towards PT goals: Progressing toward goals    Frequency    Min 1X/week      PT Plan      Co-evaluation PT/OT/SLP Co-Evaluation/Treatment: Yes Reason for Co-Treatment: Complexity of the patient's impairments (multi-system involvement);To address functional/ADL transfers PT goals addressed during session: Mobility/safety with mobility OT goals addressed during session: ADL's and self-care      AM-PAC PT 6 Clicks Mobility   Outcome Measure  Help needed turning from your back to your side while in a flat bed without using bedrails?: A Lot Help needed moving from lying on your back to sitting on the side of a flat bed without using bedrails?: Total Help needed moving to and from a bed to a chair (including a wheelchair)?: Total Help needed standing up from a chair using your arms (e.g., wheelchair or bedside chair)?: Total Help needed to walk in hospital room?: Total Help needed climbing 3-5 steps with a railing? : Total 6 Click Score: 7    End of Session   Activity Tolerance: Patient tolerated treatment well;Patient limited by fatigue Patient left: in bed;with call bell/phone within reach;with bed alarm set Nurse Communication: Mobility status;Need for lift equipment (use of Steady) PT Visit Diagnosis: Muscle weakness (generalized) (M62.81);Unsteadiness on feet (R26.81)     Time: 1032-1100 PT Time Calculation (min) (ACUTE ONLY): 28 min  Charges:    $Therapeutic Activity: 8-22 mins PT General Charges $$ ACUTE PT VISIT: 1 Visit                     Randine Essex, PT, MPT    Randine LULLA Essex 05/02/2023, 1:15 PM

## 2023-05-02 NOTE — Progress Notes (Signed)
 Occupational Therapy Treatment Patient Details Name: Jodi Reeves MRN: [id number] DOB: 1927-08-24 Today's Date: 05/02/2023   History of present illness Patient is a 89 year old female with acute metabolic encephalopathy with severe sepsis and right ureteral stone infection s/p cystoscopy and stent placement. Complicated by septic shock. Patient has history of anxiety, CAD, CHF, GERD, HTN, dyslipidemia, hypothyroidism, TIA, IBS   OT comments  Chart reviewed to date, pt seen as a co tx with PT on this date to optimize mobility performance in preparation for ADL tasks. Improvements noted on STS with pt performing 1x with MIN-MOD A +2 in Stedy, 1x with MOD-MAX A +2 in stedy. Good static sitting balance noted. Grooming tasks completed with SET up, LB dressing with MAX A. Pt is making progress towards goals, discharge recommendation remains appropriate.       If plan is discharge home, recommend the following:  A lot of help with walking and/or transfers;A lot of help with bathing/dressing/bathroom;Assistance with cooking/housework;Direct supervision/assist for medications management;Supervision due to cognitive status;Help with stairs or ramp for entrance;Direct supervision/assist for financial management   Equipment Recommendations  Other (comment) (defer)    Recommendations for Other Services      Precautions / Restrictions Precautions Precautions: Fall Restrictions Weight Bearing Restrictions Per Provider Order: No       Mobility Bed Mobility Overal bed mobility: Needs Assistance Bed Mobility: Supine to Sit, Sit to Supine     Supine to sit: Mod assist, Max assist, +2 for physical assistance Sit to supine: Max assist, +2 for physical assistance   General bed mobility comments: frequent vcs for sequencing    Transfers Overall transfer level: Needs assistance   Transfers: Sit to/from Stand             General transfer comment: using Stedy- MIN-MOD A +2 STS attempt, then  MOD-MAX A +2 for second STS attempt Transfer via Lift Equipment: Stedy   Balance Overall balance assessment: Needs assistance Sitting-balance support: Feet supported Sitting balance-Leahy Scale: Fair     Standing balance support: Bilateral upper extremity supported, During functional activity Standing balance-Leahy Scale: Poor                             ADL either performed or assessed with clinical judgement   ADL Overall ADL's : Needs assistance/impaired     Grooming: Wash/dry face;Sitting;Supervision/safety           Upper Body Dressing : Minimal assistance;Sitting Upper Body Dressing Details (indicate cue type and reason): gown Lower Body Dressing: Maximal assistance Lower Body Dressing Details (indicate cue type and reason): doff socks     Toileting- Clothing Manipulation and Hygiene: Maximal assistance Toileting - Clothing Manipulation Details (indicate cue type and reason): anticipate            Extremity/Trunk Assessment              Vision       Perception     Praxis      Cognition Arousal: Alert Behavior During Therapy: WFL for tasks assessed/performed Overall Cognitive Status: No family/caregiver present to determine baseline cognitive functioning Area of Impairment: Following commands, Awareness, Problem solving                       Following Commands: Follows one step commands with increased time   Awareness: Emergent Problem Solving: Slow processing, Decreased initiation, Difficulty sequencing, Requires verbal cues  Exercises Other Exercises Other Exercises: edu re role of OT, progressing mobiltiy    Shoulder Instructions       General Comments HR up to 140 bpm with STS attempts, 125bpm after returning to semi supine after tx session, nurse notified, BP and spo2 appear stable throughout.    Pertinent Vitals/ Pain       Pain Assessment Pain Assessment: No/denies pain  Home Living                                           Prior Functioning/Environment              Frequency  Min 1X/week        Progress Toward Goals  OT Goals(current goals can now be found in the care plan section)  Progress towards OT goals: Progressing toward goals  Acute Rehab OT Goals Time For Goal Achievement: 05/14/23  Plan      Co-evaluation    PT/OT/SLP Co-Evaluation/Treatment: Yes Reason for Co-Treatment: Complexity of the patient's impairments (multi-system involvement);To address functional/ADL transfers   OT goals addressed during session: ADL's and self-care      AM-PAC OT 6 Clicks Daily Activity     Outcome Measure   Help from another person eating meals?: A Little Help from another person taking care of personal grooming?: A Little Help from another person toileting, which includes using toliet, bedpan, or urinal?: A Lot Help from another person bathing (including washing, rinsing, drying)?: A Lot Help from another person to put on and taking off regular upper body clothing?: A Little Help from another person to put on and taking off regular lower body clothing?: A Lot 6 Click Score: 15    End of Session Equipment Utilized During Treatment: Other (comment) (stedy)  OT Visit Diagnosis: Other abnormalities of gait and mobility (R26.89);Muscle weakness (generalized) (M62.81)   Activity Tolerance Patient tolerated treatment well   Patient Left in bed;with call bell/phone within reach;with bed alarm set   Nurse Communication Mobility status;Other (comment) (HR)        Time: 1032-1100 OT Time Calculation (min): 28 min  Charges: OT General Charges $OT Visit: 1 Visit OT Treatments $Therapeutic Activity: 8-22 mins Therisa Sheffield, OTD OTR/L  05/02/23, 1:01 PM

## 2023-05-02 NOTE — Progress Notes (Signed)
 Progress Note   Patient: Jodi Reeves FMW:[id number] DOB: 1927/07/06 DOA: 04/25/2023     7 DOS: the patient was seen and examined on 05/02/2023   Brief hospital course: She is a 89 y.o. female admitted with Acute Metabolic Encephalopathy in setting of Severe Sepsis due to ESBL E. Coli BACTEREMIA due to Right Ureteral Stone infection, status post Cystoscopy and right ureteral stent placement. Course complicated by development of septic shock. She has now been weaned off vasopressors since yesterday, hemodynamically stable. With worsening Thrombocytopenia, ruling out HIT (sent HIT antibody and serotonin assay today), stopped Lovenox  and transitioned back to her home Eliquis  (chronic A.fib). PT/OT consulted, and removing central and arterial lines. Downgraded to Stepdown, may transfer out to Progressive later this evening. Please let us  know if you have any additional questions or concerns. Thanks!    Assessment and Plan:   #Septic Shock #ESBL E. Coli Bacteremia #AKI #Ureteral Stone     #Septic Shock ~ RESOLVED  Patient has been weaned off vasopressor support Continue current antibiotics   #Severe Sepsis  #ESBL E. Coli BACTEREMIA  #Right Ureteral Stone infection ~ status post Right Ureteral Stent on 1/1 -Monitor fever curve -Trend WBC's -Follow cultures as above Continue empiric Meropenem  complete 10 days course for complicated UTI Urology was consulted.  Appreciate input   #Acute Kidney Injury ~ RESOLVED #AG Metabolic Acidosis ~ RESOLVED #Mild Hypokalemia ~ RESOLVED #Mild Hyponatremia ~ RESOLVED -Patient received IV fluid Continue to monitor renal function Continue to monitor and replete electrolytes   #Thrombocytopenia-improving Monitor for any signs of bleeding Monitor platelets levels Transfuse Platelets for Platelet count <10 K, <50 K with active bleeding, or <100 K with Neurosurgical procedures Patient with 4 T-score of 4 ,Serotonin antibodies pending however heparin  antibodies negative Lovenox  discontinued    #Diabetes Mellitus Type II Monitor glucose level Continue current insulin  therapy  #Hypothyroidism Continue Synthroid    #Acute Metabolic Encephalopathy ~ IMPROVING -Treatment of sepsis and metabolic derangements as outlined above -Provide supportive care -Promote normal sleep/wake cycle and family presence -Avoid sedating medications as able   #Paroxymal Atrial Fibrillation Lovenox  was discontinued and Eliquis  started in the ICU by intensivist we will continue   Diet/type: Regular diet   DVT prophylaxis: Eliquis    GI prophylaxis: N/A   Code Status:  DNR     Subjective:  Patient seen and examined at bedside this morning Patient still on 2 L intranasal oxygen Denies worsening shortness of breath chest pain or cough We will move out of the ICU today   Physical Exam:   General: no acute distress, resting in bed on intranasal oxygen HEENT: [place]/AT, moist mucous membranes, sclera anicteric Neuro: A&O x 3 CV: rrr, s1s2, no murmurs PULM: clear to auscultation bilaterally. No wheezing GI: soft, non-tender, non-distended, BS+ Extremities: warm, no edema  Data Reviewed:    Latest Ref Rng & Units 05/02/2023    5:58 AM 05/01/2023    4:47 AM 04/30/2023    4:40 AM  CBC  WBC 4.0 - 10.5 K/uL 5.0  5.5  12.8   Hemoglobin 12.0 - 15.0 g/dL 88.7  89.1  89.2   Hematocrit 36.0 - 46.0 % 33.2  32.3  31.7   Platelets 150 - 400 K/uL 72  62  66        Latest Ref Rng & Units 05/02/2023    5:58 AM 05/01/2023    4:47 AM 04/30/2023    4:40 AM  BMP  Glucose 70 - 99 mg/dL 78  98  111   BUN 8 - 23 mg/dL 23  36  53   Creatinine 0.44 - 1.00 mg/dL 9.54  9.41  9.23   Sodium 135 - 145 mmol/L 144  140  138   Potassium 3.5 - 5.1 mmol/L 3.6  3.7  3.9   Chloride 98 - 111 mmol/L 106  102  100   CO2 22 - 32 mmol/L 28  26  28    Calcium  8.9 - 10.3 mg/dL 8.1  7.7  7.9      Vitals:   05/02/23 1300 05/02/23 1400 05/02/23 1500 05/02/23 1619  BP: (!) 132/100 121/65  119/61 (!) 149/82  Pulse:  (!) 38 97   Resp: 17 14 15 18   Temp:    98.1 F (36.7 C)  TempSrc:      SpO2: 95% 95% 92% 98%  Weight:      Height:         Author: Drue ONEIDA Potter, MD 05/02/2023 4:28 PM  For on call review [email or web address].

## 2023-05-02 NOTE — Progress Notes (Signed)
 PHARMACY CONSULT NOTE - FOLLOW UP  Pharmacy Consult for Electrolyte Monitoring and Replacement   Recent Labs: Potassium (mmol/L)  Date Value  05/02/2023 3.6   Magnesium  (mg/dL)  Date Value  98/91/7974 1.8   Calcium  (mg/dL)  Date Value  98/91/7974 8.1 (L)   Albumin (g/dL)  Date Value  98/92/7974 1.9 (L)  09/26/2019 4.3   Phosphorus (mg/dL)  Date Value  98/91/7974 2.6   Sodium (mmol/L)  Date Value  05/02/2023 144  09/14/2020 139     Assessment: 89 yo female with medical history significant for hypothyroidism, HTN, afib, CHF, DM who presented to the emergency room with AMS. Pharmacy is asked to follow and replace electrolytes while in CCU  Goal of Therapy:  Electrolytes WNL  Plan:  no electrolyte replacement warranted for today Because this consult was generated as part of an ICU order set and patient is transferring pharmacy will sign off for now Please feel free to reach out if any further assistance is needed   Adriana JONETTA Bolster ,PharmD Clinical Pharmacist 05/02/2023 7:23 AM

## 2023-05-02 NOTE — TOC Progression Note (Signed)
 Transition of Care [facility]) - Progression Note    Patient Details  Name: Jodi Reeves MRN: [id number] Date of Birth: Apr 26, 1927  Transition of Care [facility]) CM/SW Contact  Lauraine JAYSON Carpen, LCSW Phone Number: 05/02/2023, 2:47 PM  Clinical Narrative:   Per MD, likely discharge in two days. Auth approved: [id number]. Valid 1/10-1/16. [facility] admissions coordinator is aware.  Expected Discharge Plan: Long Term Nursing Home Barriers to Discharge: No Barriers Identified  Expected Discharge Plan and Services     Post Acute Care Choice:  ([facility])                                         Social Determinants of Health (SDOH) Interventions SDOH Screenings   Food Insecurity: No Food Insecurity (06/21/2021)  Housing: Low Risk  (06/21/2021)  Transportation Needs: No Transportation Needs (06/21/2021)  Depression (PHQ2-9): Low Risk  (06/21/2021)  Financial Resource Strain: Low Risk  (06/21/2021)  Physical Activity: Insufficiently Active (06/18/2020)  Social Connections: Unknown (06/21/2021)  Stress: No Stress Concern Present (06/21/2021)  Tobacco Use: Low Risk  (04/25/2023)    Readmission Risk Interventions     No data to display

## 2023-05-02 NOTE — Plan of Care (Signed)
  Problem: Fluid Volume: Goal: Hemodynamic stability will improve Outcome: Progressing   Problem: Clinical Measurements: Goal: Diagnostic test results will improve Outcome: Progressing   Problem: Respiratory: Goal: Ability to maintain adequate ventilation will improve Outcome: Progressing   Problem: Education: Goal: Knowledge of General Education information will improve Description: Including pain rating scale, medication(s)/side effects and non-pharmacologic comfort measures Outcome: Progressing   Problem: Clinical Measurements: Goal: Respiratory complications will improve Outcome: Progressing Goal: Cardiovascular complication will be avoided Outcome: Progressing   Problem: Coping: Goal: Level of anxiety will decrease Outcome: Progressing   Problem: Elimination: Goal: Will not experience complications related to urinary retention Outcome: Progressing   Problem: Pain Management: Goal: General experience of comfort will improve Outcome: Progressing   Problem: Skin Integrity: Goal: Risk for impaired skin integrity will decrease Outcome: Progressing   Problem: Coping: Goal: Ability to adjust to condition or change in health will improve Outcome: Progressing   Problem: Fluid Volume: Goal: Ability to maintain a balanced intake and output will improve Outcome: Progressing   Problem: Skin Integrity: Goal: Risk for impaired skin integrity will decrease Outcome: Progressing   Problem: Tissue Perfusion: Goal: Adequacy of tissue perfusion will improve Outcome: Progressing

## 2023-05-03 DIAGNOSIS — R7881 Bacteremia: Secondary | ICD-10-CM | POA: Diagnosis not present

## 2023-05-03 DIAGNOSIS — N179 Acute kidney failure, unspecified: Secondary | ICD-10-CM | POA: Diagnosis not present

## 2023-05-03 DIAGNOSIS — E876 Hypokalemia: Secondary | ICD-10-CM

## 2023-05-03 DIAGNOSIS — N139 Obstructive and reflux uropathy, unspecified: Secondary | ICD-10-CM | POA: Diagnosis not present

## 2023-05-03 DIAGNOSIS — I5032 Chronic diastolic (congestive) heart failure: Secondary | ICD-10-CM

## 2023-05-03 DIAGNOSIS — I480 Paroxysmal atrial fibrillation: Secondary | ICD-10-CM | POA: Diagnosis not present

## 2023-05-03 LAB — CBC WITH DIFFERENTIAL/PLATELET
Abs Immature Granulocytes: 0.11 10*3/uL — ABNORMAL HIGH (ref 0.00–0.07)
Basophils Absolute: 0 10*3/uL (ref 0.0–0.1)
Basophils Relative: 0 %
Eosinophils Absolute: 0.3 10*3/uL (ref 0.0–0.5)
Eosinophils Relative: 5 %
HCT: 30.9 % — ABNORMAL LOW (ref 36.0–46.0)
Hemoglobin: 10.5 g/dL — ABNORMAL LOW (ref 12.0–15.0)
Immature Granulocytes: 2 %
Lymphocytes Relative: 28 %
Lymphs Abs: 1.5 10*3/uL (ref 0.7–4.0)
MCH: 33.5 pg (ref 26.0–34.0)
MCHC: 34 g/dL (ref 30.0–36.0)
MCV: 98.7 fL (ref 80.0–100.0)
Monocytes Absolute: 0.4 10*3/uL (ref 0.1–1.0)
Monocytes Relative: 8 %
Neutro Abs: 3 10*3/uL (ref 1.7–7.7)
Neutrophils Relative %: 57 %
Platelets: 87 10*3/uL — ABNORMAL LOW (ref 150–400)
RBC: 3.13 MIL/uL — ABNORMAL LOW (ref 3.87–5.11)
RDW: 13.2 % (ref 11.5–15.5)
WBC: 5.3 10*3/uL (ref 4.0–10.5)
nRBC: 0 % (ref 0.0–0.2)

## 2023-05-03 LAB — SEROTONIN RELEASE ASSAY (SRA)
SRA .2 IU/mL UFH Ser-aCnc: <1 % (ref 0–20)
SRA 100IU/mL UFH Ser-aCnc: 2 % (ref 0–20)

## 2023-05-03 LAB — BASIC METABOLIC PANEL
Anion gap: 7 (ref 5–15)
BUN: 20 mg/dL (ref 8–23)
CO2: 29 mmol/L (ref 22–32)
Calcium: 7.7 mg/dL — ABNORMAL LOW (ref 8.9–10.3)
Chloride: 104 mmol/L (ref 98–111)
Creatinine, Ser: 0.51 mg/dL (ref 0.44–1.00)
GFR, Estimated: >60 mL/min (ref >60)
Glucose, Bld: 89 mg/dL (ref 70–99)
Potassium: 4.1 mmol/L (ref 3.5–5.1)
Sodium: 140 mmol/L (ref 135–145)

## 2023-05-03 LAB — GLUCOSE, CAPILLARY
Glucose-Capillary: 137 mg/dL — ABNORMAL HIGH (ref 70–99)
Glucose-Capillary: 89 mg/dL (ref 70–99)
Glucose-Capillary: 118 mg/dL — ABNORMAL HIGH (ref 70–99)
Glucose-Capillary: 233 mg/dL — ABNORMAL HIGH (ref 70–99)
Glucose-Capillary: 128 mg/dL — ABNORMAL HIGH (ref 70–99)
Glucose-Capillary: 144 mg/dL — ABNORMAL HIGH (ref 70–99)
Glucose-Capillary: 129 mg/dL — ABNORMAL HIGH (ref 70–99)

## 2023-05-03 NOTE — Care Management Important Message (Signed)
 Important Message  Patient Details  Name: Jodi Reeves MRN: [id number] Date of Birth: 01/31/1928   Important Message Given:  Yes - Medicare IM     Cristela Blue, CMA 05/03/2023, 4:49 PM

## 2023-05-03 NOTE — TOC Progression Note (Signed)
 Transition of Care [facility] - Progression Note    Patient Details  Name: Jodi Reeves MRN: [id number] Date of Birth: October 07, 1927  Transition of Care [facility]) CM/SW Contact  Corean ONEIDA Haddock, RN Phone Number: 05/03/2023, 10:09 AM  Clinical Narrative:     Signed DNR on chart  Expected Discharge Plan: Long Term Nursing Home Barriers to Discharge: No Barriers Identified  Expected Discharge Plan and Services     Post Acute Care Choice:  ([facility])                                         Social Determinants of Health (SDOH) Interventions SDOH Screenings   Food Insecurity: No Food Insecurity (06/21/2021)  Housing: Low Risk  (06/21/2021)  Transportation Needs: No Transportation Needs (06/21/2021)  Depression (PHQ2-9): Low Risk  (06/21/2021)  Financial Resource Strain: Low Risk  (06/21/2021)  Physical Activity: Insufficiently Active (06/18/2020)  Social Connections: Unknown (06/21/2021)  Stress: No Stress Concern Present (06/21/2021)  Tobacco Use: Low Risk  (04/25/2023)    Readmission Risk Interventions     No data to display

## 2023-05-03 NOTE — Plan of Care (Signed)
  Problem: Nutrition: Goal: Adequate nutrition will be maintained Outcome: Progressing   Problem: Coping: Goal: Level of anxiety will decrease Outcome: Progressing   Problem: Safety: Goal: Ability to remain free from injury will improve Outcome: Progressing   Problem: Skin Integrity: Goal: Risk for impaired skin integrity will decrease Outcome: Progressing   

## 2023-05-03 NOTE — Plan of Care (Signed)
  Problem: Respiratory: Goal: Ability to maintain adequate ventilation will improve Outcome: Progressing   Problem: Clinical Measurements: Goal: Diagnostic test results will improve Outcome: Progressing Goal: Respiratory complications will improve Outcome: Progressing Goal: Cardiovascular complication will be avoided Outcome: Progressing   Problem: Activity: Goal: Risk for activity intolerance will decrease Outcome: Progressing   Problem: Nutrition: Goal: Adequate nutrition will be maintained Outcome: Progressing   Problem: Elimination: Goal: Will not experience complications related to bowel motility Outcome: Progressing Goal: Will not experience complications related to urinary retention Outcome: Progressing   Problem: Pain Management: Goal: General experience of comfort will improve Outcome: Progressing   Problem: Safety: Goal: Ability to remain free from injury will improve Outcome: Progressing   Problem: Metabolic: Goal: Ability to maintain appropriate glucose levels will improve Outcome: Progressing   Problem: Nutritional: Goal: Maintenance of adequate nutrition will improve Outcome: Progressing   Problem: Skin Integrity: Goal: Risk for impaired skin integrity will decrease Outcome: Progressing   Problem: Tissue Perfusion: Goal: Adequacy of tissue perfusion will improve Outcome: Progressing

## 2023-05-03 NOTE — Progress Notes (Signed)
 Progress Note   Patient: Jodi Reeves FMW:[id number] DOB: 12-12-1927 DOA: 04/25/2023     8 DOS: the patient was seen and examined on 05/03/2023   Brief hospital course: Jodi Reeves is a 89 yo female with PMH anxiety, chronic diastolic CHF, CAD, DM II, GERD, HTN, HLD, hypothyroidism, PAF who presented with altered mentation and dysuria.  She resides at skilled nursing facility and her urine began looking more cloudy and she was referred to the ER. On arrival while she was found to be febrile with mild leukocytosis and elevated lactic acid.  UA concerning for infection as well. CT renal stone protocol was performed showing obstructed distal right ureter with 2 mm stone.  Urology was consulted and she underwent cystoscopy with right ureteral stent placement.  Blood cultures after admission also began growing E. coli ESBL.  She had already been started on meropenem  on admission as well after her allergy profile was taken into consideration. After cystoscopy, she developed worsening refractory hypotension despite fluid resuscitation.  Lactic acid further up trended and she was transferred to the ICU for vasopressor support.  Assessment and Plan: Septic Shock ~ RESOLVED  Severe Sepsis  ESBL E. Coli BACTEREMIA  Right Ureteral Stone infection ~ status post Right Ureteral Stent on 1/1 She remained afebrile. She is weaned off pressors. WBC normal. Continue empiric Meropenem  complete 10 days course for complicated UTI Urology follow up appreciated.   Acute Kidney Injury ~ RESOLVED Metabolic Acidosis ~ RESOLVED Hypokalemia ~ RESOLVED Hyponatremia ~ RESOLVED Kidney function baseline, post fluids Continue to monitor renal function. Continue to monitor and replete electrolytes   Thrombocytopenia-improving No signs of bleeding Platelets 87 today. Transfuse Platelets for Platelet count <10 K, <50 K with active bleeding, or <100 K with Neurosurgical procedures Patient with 4 T-score of 4 ,Serotonin  antibodies pending however heparin antibodies negative.  Lovenox  discontinued   Diabetes Mellitus Type II Sugars stable. A1c 8.0. Continue current insulin  therapy   Hypothyroidism Continue Synthroid    Acute Metabolic Encephalopathy ~ Improving Continue to treat sepsis and metabolic derangements as outlined above Provide supportive care Promote normal sleep/wake cycle and family presence Avoid sedating medications as able. Delirium precautions.   Paroxymal Atrial Fibrillation Lovenox  was discontinued and Eliquis  started in the ICU by intensivist we will continue     Out of bed to chair. Incentive spirometry. Nursing supportive care. Fall, aspiration precautions. DVT prophylaxis   Code Status: Limited: Do not attempt resuscitation (DNR) -DNR-LIMITED -Do Not Intubate/DNI   Subjective: Patient is seen and examined today morning. She is weak, lying in bed, denies any complaints.  Physical Exam: Vitals:   05/02/23 1950 05/03/23 0426 05/03/23 0749 05/03/23 0829  BP: 127/75 119/69 (!) 164/82 (!) 157/86  Pulse: (!) 102 95 (!) 101 100  Resp: 17 18 18    Temp: 98.2 F (36.8 C) 98 F (36.7 C) 98.2 F (36.8 C)   TempSrc:  Oral Oral   SpO2: 97% 95% 99%   Weight:      Height:        General - Elderly Caucasian female, no apparent distress HEENT - PERRLA, EOMI, atraumatic head, non tender sinuses. Lung - Clear, basal rales, diffuse rhonchi, no wheezes. Heart - S1, S2 heard, no murmurs, rubs, 1+ pedal edema. Abdomen - Soft, non tender, bowel sounds good Neuro - Alert, awake and oriented, non focal exam. Skin - Warm and dry.  Data Reviewed:      Latest Ref Rng & Units 05/03/2023    3:30 AM  05/02/2023    5:58 AM 05/01/2023    4:47 AM  CBC  WBC 4.0 - 10.5 K/uL 5.3  5.0  5.5   Hemoglobin 12.0 - 15.0 g/dL 89.4  88.7  89.1   Hematocrit 36.0 - 46.0 % 30.9  33.2  32.3   Platelets 150 - 400 K/uL 87  72  62       Latest Ref Rng & Units 05/03/2023    3:30 AM 05/02/2023    5:58 AM  05/01/2023    4:47 AM  BMP  Glucose 70 - 99 mg/dL 89  78  98   BUN 8 - 23 mg/dL 20  23  36   Creatinine 0.44 - 1.00 mg/dL 9.48  9.54  9.41   Sodium 135 - 145 mmol/L 140  144  140   Potassium 3.5 - 5.1 mmol/L 4.1  3.6  3.7   Chloride 98 - 111 mmol/L 104  106  102   CO2 22 - 32 mmol/L 29  28  26    Calcium  8.9 - 10.3 mg/dL 7.7  8.1  7.7    No results found.   Disposition: Status is: Inpatient Remains inpatient appropriate because: IV antibiotics till 05/05/23  Planned Discharge Destination: Skilled nursing facility     Time spent: 37 minutes  Author: Concepcion Riser, MD 05/03/2023 2:35 PM Secure chat 7am to 7pm For on call review [email or web address]

## 2023-05-04 DIAGNOSIS — N179 Acute kidney failure, unspecified: Secondary | ICD-10-CM | POA: Diagnosis not present

## 2023-05-04 DIAGNOSIS — N139 Obstructive and reflux uropathy, unspecified: Secondary | ICD-10-CM | POA: Diagnosis not present

## 2023-05-04 DIAGNOSIS — R7881 Bacteremia: Secondary | ICD-10-CM | POA: Diagnosis not present

## 2023-05-04 DIAGNOSIS — I480 Paroxysmal atrial fibrillation: Secondary | ICD-10-CM | POA: Diagnosis not present

## 2023-05-04 LAB — GLUCOSE, CAPILLARY
Glucose-Capillary: 82 mg/dL (ref 70–99)
Glucose-Capillary: 98 mg/dL (ref 70–99)
Glucose-Capillary: 217 mg/dL — ABNORMAL HIGH (ref 70–99)
Glucose-Capillary: 147 mg/dL — ABNORMAL HIGH (ref 70–99)
Glucose-Capillary: 202 mg/dL — ABNORMAL HIGH (ref 70–99)
Glucose-Capillary: 87 mg/dL (ref 70–99)

## 2023-05-04 MED ORDER — OLOPATADINE HCL 0.1 % OP SOLN
1.0000 [drp] | Freq: Two times a day (BID) | OPHTHALMIC | Status: DC
  Administered 2023-05-04 – 2023-05-07 (×6): 1 [drp] via OPHTHALMIC
  Filled 2023-05-04: qty 5

## 2023-05-04 MED ORDER — BISOPROLOL FUMARATE 5 MG PO TABS
5.0000 mg | ORAL_TABLET | Freq: Every day | ORAL | Status: DC
  Administered 2023-05-04 – 2023-05-07 (×4): 5 mg via ORAL
  Filled 2023-05-04 (×4): qty 1

## 2023-05-04 MED ORDER — FLUTICASONE PROPIONATE 50 MCG/ACT NA SUSP
1.0000 | Freq: Every day | NASAL | Status: DC
  Administered 2023-05-05 – 2023-05-07 (×3): 1 via NASAL
  Filled 2023-05-04: qty 16

## 2023-05-04 MED ORDER — FAMOTIDINE 20 MG PO TABS
20.0000 mg | ORAL_TABLET | Freq: Every day | ORAL | Status: DC
  Administered 2023-05-04 – 2023-05-06 (×3): 20 mg via ORAL
  Filled 2023-05-04 (×3): qty 1

## 2023-05-04 MED ORDER — HYDROXYZINE HCL 25 MG PO TABS: 25.0000 mg | ORAL_TABLET | Freq: Three times a day (TID) | ORAL | Status: DC | PRN

## 2023-05-04 MED ORDER — ALUM & MAG HYDROXIDE-SIMETH 200-200-20 MG/5ML PO SUSP: 30.0000 mL | ORAL | Status: DC | PRN

## 2023-05-04 MED ORDER — PROSIGHT PO TABS
1.0000 | ORAL_TABLET | Freq: Every day | ORAL | Status: DC
  Administered 2023-05-05 – 2023-05-07 (×3): 1 via ORAL
  Filled 2023-05-04 (×3): qty 1

## 2023-05-04 MED ORDER — HEALTHY EYES SUPERVISION 2 PO CAPS: 1.0000 | ORAL_CAPSULE | Freq: Two times a day (BID) | ORAL | Status: DC

## 2023-05-04 NOTE — Plan of Care (Signed)
  Problem: Nutrition: Goal: Adequate nutrition will be maintained Outcome: Progressing   Problem: Coping: Goal: Level of anxiety will decrease Outcome: Progressing   Problem: Elimination: Goal: Will not experience complications related to urinary retention Outcome: Progressing   Problem: Safety: Goal: Ability to remain free from injury will improve Outcome: Progressing   Problem: Skin Integrity: Goal: Risk for impaired skin integrity will decrease Outcome: Progressing   Problem: Coping: Goal: Ability to adjust to condition or change in health will improve Outcome: Progressing

## 2023-05-04 NOTE — Progress Notes (Signed)
 Progress Note   Patient: Jodi Reeves FMW:[id number] DOB: 02-21-1928 DOA: 04/25/2023     9 DOS: the patient was seen and examined on 05/04/2023   Brief hospital course: Jodi Reeves is a 89 yo female with PMH anxiety, chronic diastolic CHF, CAD, DM II, GERD, HTN, HLD, hypothyroidism, PAF who presented with altered mentation and dysuria.  She resides at skilled nursing facility and her urine began looking more cloudy and she was referred to the ER. On arrival while she was found to be febrile with mild leukocytosis and elevated lactic acid.  UA concerning for infection as well. CT renal stone protocol was performed showing obstructed distal right ureter with 2 mm stone.  Urology was consulted and she underwent cystoscopy with right ureteral stent placement.  Blood cultures after admission also began growing E. coli ESBL.  She had already been started on meropenem  on admission as well after her allergy profile was taken into consideration. After cystoscopy, she developed worsening refractory hypotension despite fluid resuscitation.  Lactic acid further up trended and she was transferred to the ICU for vasopressor support.  Assessment and Plan: Septic Shock ~ RESOLVED  Severe Sepsis  ESBL E. Coli BACTEREMIA  Right Ureteral Stone infection ~ status post Right Ureteral Stent on 1/1 She remained afebrile. She is weaned off pressors. WBC 59 upon presentation improved to normal. Continue empiric Meropenem  complete 10 days course for complicated UTI. Urology follow up as she will require follow-up ureteroscopy for definitive stone management in about 3 weeks.   Acute Kidney Injury ~ RESOLVED Metabolic Acidosis ~ RESOLVED Hypokalemia ~ RESOLVED Hyponatremia ~ RESOLVED Kidney function baseline, post fluids Continue to monitor renal function. Continue to monitor and replete electrolytes   Thrombocytopenia-improving No signs of bleeding Platelets 87 today. Transfuse Platelets for Platelet count  <10 K, <50 K with active bleeding, or <100 K with Neurosurgical procedures Patient with 4 T-score of 4 ,Serotonin antibodies pending however heparin antibodies negative.    Diabetes Mellitus Type II Sugars stable. A1c 8.0. Continue accucheks, insulin  sliding scale. Hypoglycemia protocol.   Hypothyroidism Continue Synthroid .   Acute Metabolic Encephalopathy ~ Improving Continue to treat sepsis and metabolic derangements as outlined above Provide supportive care. Her mental status better today. Promote normal sleep/wake cycle and family presence Avoid sedating medications as able. Delirium precautions.   Paroxymal Atrial Fibrillation Continue Eliquis , HR>100, will restart bisoprolol     Out of bed to chair. Incentive spirometry. Nursing supportive care. Fall, aspiration precautions. DVT prophylaxis   Code Status: Limited: Do not attempt resuscitation (DNR) -DNR-LIMITED -Do Not Intubate/DNI  Discussed with son yesterday regarding her care. He understand and agree.  Subjective: Patient is seen and examined today morning. She is weak, lying in bed, able to answer me. Eating poor. No other complaints.  Physical Exam: Vitals:   05/03/23 1937 05/04/23 0430 05/04/23 0735 05/04/23 1551  BP: (!) 146/63 128/71 (!) 146/94 (!) 139/91  Pulse: 98 (!) 105 (!) 106 (!) 107  Resp: 18 18 18 16   Temp: 98.2 F (36.8 C) (!) 97.4 F (36.3 C) 98 F (36.7 C) 98.2 F (36.8 C)  TempSrc: Oral Axillary Oral Oral  SpO2: 98% 98% 97% 97%  Weight:      Height:        General - Elderly Caucasian female, no apparent distress HEENT - PERRLA, EOMI, atraumatic head, non tender sinuses. Lung - Clear, basal rales, diffuse rhonchi, no wheezes. Heart - S1, S2 heard, no murmurs, rubs, 1+ pedal edema. Abdomen - Soft,  non tender, bowel sounds good Neuro - Alert, awake and oriented, non focal exam. Skin - Warm and dry.  Data Reviewed:      Latest Ref Rng & Units 05/03/2023    3:30 AM 05/02/2023    5:58 AM  05/01/2023    4:47 AM  CBC  WBC 4.0 - 10.5 K/uL 5.3  5.0  5.5   Hemoglobin 12.0 - 15.0 g/dL 89.4  88.7  89.1   Hematocrit 36.0 - 46.0 % 30.9  33.2  32.3   Platelets 150 - 400 K/uL 87  72  62       Latest Ref Rng & Units 05/03/2023    3:30 AM 05/02/2023    5:58 AM 05/01/2023    4:47 AM  BMP  Glucose 70 - 99 mg/dL 89  78  98   BUN 8 - 23 mg/dL 20  23  36   Creatinine 0.44 - 1.00 mg/dL 9.48  9.54  9.41   Sodium 135 - 145 mmol/L 140  144  140   Potassium 3.5 - 5.1 mmol/L 4.1  3.6  3.7   Chloride 98 - 111 mmol/L 104  106  102   CO2 22 - 32 mmol/L 29  28  26    Calcium  8.9 - 10.3 mg/dL 7.7  8.1  7.7    No results found.   Disposition: Status is: Inpatient Remains inpatient appropriate because: IV antibiotics till 05/05/23  Planned Discharge Destination: Skilled nursing facility     Time spent: 38 minutes  Author: Concepcion Riser, MD 05/04/2023 4:02 PM Secure chat 7am to 7pm For on call review [email or web address]

## 2023-05-04 NOTE — TOC Progression Note (Signed)
 Transition of Care [facility]) - Progression Note    Patient Details  Name: Jodi Reeves MRN: [id number] Date of Birth: [birth year]  [facility]) CM/SW Contact  Corean ONEIDA Haddock, RN Phone Number: 05/04/2023, 10:15 AM  Clinical Narrative:     Per MD anticipated DC Monday [facility] notified  Expected Discharge Plan: Long Term Nursing Home Barriers to Discharge: No Barriers Identified  Expected Discharge Plan and Services     Post Acute Care Choice:  ([facility])                                         Social Determinants of Health (SDOH) Interventions SDOH Screenings   Food Insecurity: No Food Insecurity (06/21/2021)  Housing: Low Risk  (06/21/2021)  Transportation Needs: No Transportation Needs (06/21/2021)  Depression (PHQ2-9): Low Risk  (06/21/2021)  Financial Resource Strain: Low Risk  (06/21/2021)  Physical Activity: Insufficiently Active (06/18/2020)  Social Connections: Unknown (06/21/2021)  Stress: No Stress Concern Present (06/21/2021)  Tobacco Use: Low Risk  (04/25/2023)    Readmission Risk Interventions     No data to display

## 2023-05-04 NOTE — Plan of Care (Signed)

## 2023-05-05 DIAGNOSIS — I480 Paroxysmal atrial fibrillation: Secondary | ICD-10-CM | POA: Diagnosis not present

## 2023-05-05 DIAGNOSIS — N139 Obstructive and reflux uropathy, unspecified: Secondary | ICD-10-CM | POA: Diagnosis not present

## 2023-05-05 DIAGNOSIS — N179 Acute kidney failure, unspecified: Secondary | ICD-10-CM | POA: Diagnosis not present

## 2023-05-05 DIAGNOSIS — R7881 Bacteremia: Secondary | ICD-10-CM | POA: Diagnosis not present

## 2023-05-05 LAB — CULTURE, BLOOD (ROUTINE X 2)
Culture: NO GROWTH
Culture: NO GROWTH
Special Requests: ADEQUATE
Special Requests: ADEQUATE

## 2023-05-05 LAB — GLUCOSE, CAPILLARY
Glucose-Capillary: 70 mg/dL (ref 70–99)
Glucose-Capillary: 109 mg/dL — ABNORMAL HIGH (ref 70–99)
Glucose-Capillary: 107 mg/dL — ABNORMAL HIGH (ref 70–99)
Glucose-Capillary: 269 mg/dL — ABNORMAL HIGH (ref 70–99)
Glucose-Capillary: 119 mg/dL — ABNORMAL HIGH (ref 70–99)
Glucose-Capillary: 126 mg/dL — ABNORMAL HIGH (ref 70–99)

## 2023-05-05 LAB — CBC
HCT: 33.2 % — ABNORMAL LOW (ref 36.0–46.0)
Hemoglobin: 11.3 g/dL — ABNORMAL LOW (ref 12.0–15.0)
MCH: 33.1 pg (ref 26.0–34.0)
MCHC: 34 g/dL (ref 30.0–36.0)
MCV: 97.4 fL (ref 80.0–100.0)
Platelets: 135 10*3/uL — ABNORMAL LOW (ref 150–400)
RBC: 3.41 MIL/uL — ABNORMAL LOW (ref 3.87–5.11)
RDW: 13.4 % (ref 11.5–15.5)
WBC: 5 10*3/uL (ref 4.0–10.5)
nRBC: 0.4 % — ABNORMAL HIGH (ref 0.0–0.2)

## 2023-05-05 LAB — BASIC METABOLIC PANEL
Anion gap: 8 (ref 5–15)
BUN: 19 mg/dL (ref 8–23)
CO2: 22 mmol/L (ref 22–32)
Calcium: 7.6 mg/dL — ABNORMAL LOW (ref 8.9–10.3)
Chloride: 104 mmol/L (ref 98–111)
Creatinine, Ser: 0.58 mg/dL (ref 0.44–1.00)
GFR, Estimated: >60 mL/min (ref >60)
Glucose, Bld: 124 mg/dL — ABNORMAL HIGH (ref 70–99)
Potassium: 4.7 mmol/L (ref 3.5–5.1)
Sodium: 134 mmol/L — ABNORMAL LOW (ref 135–145)

## 2023-05-05 MED ORDER — DOCUSATE SODIUM 100 MG PO CAPS
100.0000 mg | ORAL_CAPSULE | Freq: Two times a day (BID) | ORAL | Status: DC
  Administered 2023-05-05 – 2023-05-07 (×5): 100 mg via ORAL
  Filled 2023-05-05 (×5): qty 1

## 2023-05-05 MED ORDER — POLYETHYLENE GLYCOL 3350 17 G PO PACK
17.0000 g | PACK | Freq: Every day | ORAL | Status: DC
  Administered 2023-05-05 – 2023-05-07 (×3): 17 g via ORAL
  Filled 2023-05-05 (×3): qty 1

## 2023-05-05 MED ORDER — SENNA 8.6 MG PO TABS
2.0000 | ORAL_TABLET | Freq: Every day | ORAL | Status: DC
  Administered 2023-05-05 – 2023-05-06 (×2): 17.2 mg via ORAL
  Filled 2023-05-05 (×2): qty 2

## 2023-05-05 NOTE — Progress Notes (Signed)
 Progress Note   Patient: Jodi Reeves FMW:[id number] DOB: 1928/04/21 DOA: 04/25/2023     10 DOS: the patient was seen and examined on 05/05/2023   Brief hospital course: Ms. Bolduc is a 89 yo female with PMH anxiety, chronic diastolic CHF, CAD, DM II, GERD, HTN, HLD, hypothyroidism, PAF who presented with altered mentation and dysuria.  She resides at skilled nursing facility and her urine began looking more cloudy and she was referred to the ER. On arrival while she was found to be febrile with mild leukocytosis and elevated lactic acid.  UA concerning for infection as well. CT renal stone protocol was performed showing obstructed distal right ureter with 2 mm stone.  Urology was consulted and she underwent cystoscopy with right ureteral stent placement.  Blood cultures after admission also began growing E. coli ESBL.  She had already been started on meropenem  on admission as well after her allergy profile was taken into consideration. After cystoscopy, she developed worsening refractory hypotension despite fluid resuscitation.  Lactic acid further up trended and she was transferred to the ICU for vasopressor support.  Assessment and Plan: Septic Shock ~ RESOLVED  Severe Sepsis  ESBL E. Coli BACTEREMIA  Right Ureteral Stone infection ~ status post Right Ureteral Stent on 1/1 She remained afebrile. She is weaned off pressors. WBC 59 upon presentation improved to normal. Continue empiric Meropenem  to complete 10 days course for complicated UTI. Urology follow up as she will require follow-up ureteroscopy for definitive stone management in about 3 weeks.   Acute Kidney Injury ~ RESOLVED Metabolic Acidosis ~ RESOLVED Hypokalemia ~ RESOLVED Hyponatremia ~ RESOLVED Kidney function baseline, post fluids Continue to monitor renal function. Continue to monitor and replete electrolytes   Thrombocytopenia-improving No signs of bleeding Platelets 135 today. Patient with 4 T-score of 4  ,Serotonin antibodies pending however heparin antibodies negative.    Diabetes Mellitus Type II Sugars stable. A1c 8.0. Continue accucheks, insulin  sliding scale. Hypoglycemia protocol.   Hypothyroidism Continue Synthroid .   Acute Metabolic Encephalopathy ~ Improving Continue to treat sepsis and metabolic derangements as outlined above Provide supportive care. Her mental status better today. Promote normal sleep/wake cycle and family presence Avoid sedating medications as able. Delirium precautions.   Paroxymal Atrial Fibrillation Continue Eliquis , HR>100, will restart bisoprolol     Out of bed to chair. Incentive spirometry. Nursing supportive care. Fall, aspiration precautions. DVT prophylaxis   Code Status: Limited: Do not attempt resuscitation (DNR) -DNR-LIMITED -Do Not Intubate/DNI  Discussed with son yesterday regarding her care. He understand and agree.  Subjective: Patient is seen and examined today morning. She is weak, lying in bed, able to answer me. Taking her pills. Advised incentive spirometry, work with PT, out of bed. Has constipation.  Physical Exam: Vitals:   05/04/23 2047 05/05/23 0425 05/05/23 0801 05/05/23 1135  BP: 126/66 123/70 139/70 (!) 103/55  Pulse: 81 79 81 67  Resp: 16 20 16 18   Temp: 98.4 F (36.9 C) 97.8 F (36.6 C) 98.2 F (36.8 C) 98.7 F (37.1 C)  TempSrc: Oral Oral    SpO2: 96% 97% 98% 100%  Weight:      Height:        General - Elderly Caucasian female, no apparent distress HEENT - PERRLA, EOMI, atraumatic head, non tender sinuses. Lung - Clear, basal rales, diffuse rhonchi, no wheezes. Heart - S1, S2 heard, no murmurs, rubs, 1+ pedal edema. Abdomen - Soft, non tender, bowel sounds good Neuro - Alert, awake and oriented, non focal exam.  Skin - Warm and dry.  Data Reviewed:      Latest Ref Rng & Units 05/05/2023    6:10 AM 05/03/2023    3:30 AM 05/02/2023    5:58 AM  CBC  WBC 4.0 - 10.5 K/uL 5.0  5.3  5.0   Hemoglobin  12.0 - 15.0 g/dL 88.6  89.4  88.7   Hematocrit 36.0 - 46.0 % 33.2  30.9  33.2   Platelets 150 - 400 K/uL 135  87  72       Latest Ref Rng & Units 05/05/2023    6:10 AM 05/03/2023    3:30 AM 05/02/2023    5:58 AM  BMP  Glucose 70 - 99 mg/dL 875  89  78   BUN 8 - 23 mg/dL 19  20  23    Creatinine 0.44 - 1.00 mg/dL 9.41  9.48  9.54   Sodium 135 - 145 mmol/L 134  140  144   Potassium 3.5 - 5.1 mmol/L 4.7  4.1  3.6   Chloride 98 - 111 mmol/L 104  104  106   CO2 22 - 32 mmol/L 22  29  28    Calcium  8.9 - 10.3 mg/dL 7.6  7.7  8.1    No results found.   Disposition: Status is: Inpatient Remains inpatient appropriate because: IV antibiotics till 05/05/23  Planned Discharge Destination: Skilled nursing facility     Time spent: 36 minutes  Author: Concepcion Riser, MD 05/05/2023 2:41 PM Secure chat 7am to 7pm For on call review [email or web address]

## 2023-05-05 NOTE — Plan of Care (Signed)

## 2023-05-06 DIAGNOSIS — N179 Acute kidney failure, unspecified: Secondary | ICD-10-CM | POA: Diagnosis not present

## 2023-05-06 DIAGNOSIS — N139 Obstructive and reflux uropathy, unspecified: Secondary | ICD-10-CM | POA: Diagnosis not present

## 2023-05-06 DIAGNOSIS — R7881 Bacteremia: Secondary | ICD-10-CM | POA: Diagnosis not present

## 2023-05-06 DIAGNOSIS — I480 Paroxysmal atrial fibrillation: Secondary | ICD-10-CM | POA: Diagnosis not present

## 2023-05-06 LAB — GLUCOSE, CAPILLARY
Glucose-Capillary: 107 mg/dL — ABNORMAL HIGH (ref 70–99)
Glucose-Capillary: 108 mg/dL — ABNORMAL HIGH (ref 70–99)
Glucose-Capillary: 155 mg/dL — ABNORMAL HIGH (ref 70–99)
Glucose-Capillary: 163 mg/dL — ABNORMAL HIGH (ref 70–99)
Glucose-Capillary: 204 mg/dL — ABNORMAL HIGH (ref 70–99)
Glucose-Capillary: 211 mg/dL — ABNORMAL HIGH (ref 70–99)
Glucose-Capillary: 223 mg/dL — ABNORMAL HIGH (ref 70–99)
Glucose-Capillary: 264 mg/dL — ABNORMAL HIGH (ref 70–99)
Glucose-Capillary: 56 mg/dL — ABNORMAL LOW (ref 70–99)

## 2023-05-06 LAB — CULTURE, BLOOD (ROUTINE X 2)

## 2023-05-06 MED ORDER — INSULIN ASPART 100 UNIT/ML IJ SOLN
0.0000 [IU] | Freq: Three times a day (TID) | INTRAMUSCULAR | Status: DC
  Administered 2023-05-06: 5 [IU] via SUBCUTANEOUS
  Administered 2023-05-07 (×2): 3 [IU] via SUBCUTANEOUS
  Filled 2023-05-06 (×3): qty 1

## 2023-05-06 MED ORDER — GERHARDT'S BUTT CREAM
TOPICAL_CREAM | Freq: Two times a day (BID) | CUTANEOUS | Status: DC
  Filled 2023-05-06: qty 60

## 2023-05-06 NOTE — Progress Notes (Signed)
 Progress Note   Patient: Jodi Reeves FMW:[id number] DOB: 08/07/1927 DOA: 04/25/2023     11 DOS: the patient was seen and examined on 05/06/2023   Brief hospital course: Jodi Reeves is a 89 yo female with PMH anxiety, chronic diastolic CHF, CAD, DM II, GERD, HTN, HLD, hypothyroidism, PAF who presented with altered mentation and dysuria.  She resides at skilled nursing facility and her urine began looking more cloudy and she was referred to the ER. On arrival while she was found to be febrile with mild leukocytosis and elevated lactic acid.  UA concerning for infection as well. CT renal stone protocol was performed showing obstructed distal right ureter with 2 mm stone.  Urology was consulted and she underwent cystoscopy with right ureteral stent placement.  Blood cultures after admission also began growing E. coli ESBL.  She had already been started on meropenem  on admission as well after her allergy profile was taken into consideration. After cystoscopy, she developed worsening refractory hypotension despite fluid resuscitation.  Lactic acid further up trended and she was transferred to the ICU for vasopressor support.  Assessment and Plan: Septic Shock ~ RESOLVED  Severe Sepsis  ESBL E. Coli BACTEREMIA  Right Ureteral Stone infection ~ status post Right Ureteral Stent on 1/1 She remained afebrile. She is weaned off pressors. WBC 59 upon presentation improved to normal. Finished empiric Meropenem  to complete 10 days course for complicated UTI. Continue foley care. Urology follow up as she will require follow-up ureteroscopy for definitive stone management in about 3 weeks.   Acute Kidney Injury ~ RESOLVED Metabolic Acidosis ~ RESOLVED Hypokalemia ~ RESOLVED Hyponatremia ~ RESOLVED Kidney function baseline, post fluids Continue to monitor renal function. Continue to monitor and replete electrolytes   Thrombocytopenia-improving No signs of bleeding Platelets 135 today. Patient with 4  T-score of 4 ,Serotonin antibodies pending however heparin antibodies negative.    Diabetes Mellitus Type II Sugars stable. A1c 8.0. Continue accucheks, insulin  sliding scale. Hypoglycemia protocol.   Hypothyroidism Continue Synthroid .   Acute Metabolic Encephalopathy ~ Improving Continue to treat sepsis and metabolic derangements as outlined above Provide supportive care. Her mental status better today. Promote normal sleep/wake cycle and family presence Avoid sedating medications as able. Delirium precautions.   Paroxymal Atrial Fibrillation Continue Eliquis , HR>100, will restart bisoprolol     Out of bed to chair. Incentive spirometry. Nursing supportive care. Fall, aspiration precautions. DVT prophylaxis   Code Status: Limited: Do not attempt resuscitation (DNR) -DNR-LIMITED -Do Not Intubate/DNI  Discussed with son yesterday regarding her care. He understand and agree.  Subjective: Patient is seen and examined today morning. She is able to answer me. Son at bedside think she is not at baseline physically and mentally. She had bm today.   Physical Exam: Vitals:   05/05/23 1552 05/05/23 1956 05/06/23 0348 05/06/23 0810  BP: 124/61 (!) 115/103 119/61 128/70  Pulse: 74 73 70 63  Resp: 16  20 18   Temp: 98.2 F (36.8 C) 98.2 F (36.8 C) (!) 97.4 F (36.3 C) 98 F (36.7 C)  TempSrc:  Oral Oral Oral  SpO2: 98% 98% 96% 99%  Weight:      Height:        General - Elderly Caucasian female, no apparent distress HEENT - PERRLA, EOMI, atraumatic head, non tender sinuses. Lung - Clear, basal rales, diffuse rhonchi, no wheezes. Heart - S1, S2 heard, no murmurs, rubs, 1+ pedal edema. Abdomen - Soft, non tender, bowel sounds good Neuro - Alert, awake and oriented,  non focal exam. Skin - Warm and dry.  Data Reviewed:      Latest Ref Rng & Units 05/05/2023    6:10 AM 05/03/2023    3:30 AM 05/02/2023    5:58 AM  CBC  WBC 4.0 - 10.5 K/uL 5.0  5.3  5.0   Hemoglobin 12.0 -  15.0 g/dL 88.6  89.4  88.7   Hematocrit 36.0 - 46.0 % 33.2  30.9  33.2   Platelets 150 - 400 K/uL 135  87  72       Latest Ref Rng & Units 05/05/2023    6:10 AM 05/03/2023    3:30 AM 05/02/2023    5:58 AM  BMP  Glucose 70 - 99 mg/dL 875  89  78   BUN 8 - 23 mg/dL 19  20  23    Creatinine 0.44 - 1.00 mg/dL 9.41  9.48  9.54   Sodium 135 - 145 mmol/L 134  140  144   Potassium 3.5 - 5.1 mmol/L 4.7  4.1  3.6   Chloride 98 - 111 mmol/L 104  104  106   CO2 22 - 32 mmol/L 22  29  28    Calcium  8.9 - 10.3 mg/dL 7.6  7.7  8.1    No results found.  Family communication-discussed with son at bedside who do not think she is ready for discharge.  Wishes to appeal discharge.  All questions answered  Disposition: Status is: Inpatient Remains inpatient appropriate because: Back to SNF tomorrow.  Planned Discharge Destination: Skilled nursing facility     Time spent: 38 minutes  Author: Concepcion Riser, MD 05/06/2023 3:00 PM Secure chat 7am to 7pm For on call review [email or web address]

## 2023-05-06 NOTE — Plan of Care (Signed)
  Problem: Fluid Volume: Goal: Hemodynamic stability will improve Outcome: Progressing   Problem: Clinical Measurements: Goal: Diagnostic test results will improve Outcome: Progressing Goal: Signs and symptoms of infection will decrease Outcome: Progressing   Problem: Respiratory: Goal: Ability to maintain adequate ventilation will improve Outcome: Progressing   Problem: Education: Goal: Knowledge of General Education information will improve Description: Including pain rating scale, medication(s)/side effects and non-pharmacologic comfort measures Outcome: Progressing   Problem: Health Behavior/Discharge Planning: Goal: Ability to manage health-related needs will improve Outcome: Progressing   Problem: Clinical Measurements: Goal: Ability to maintain clinical measurements within normal limits will improve Outcome: Progressing Goal: Will remain free from infection Outcome: Progressing Goal: Diagnostic test results will improve Outcome: Progressing Goal: Respiratory complications will improve Outcome: Progressing Goal: Cardiovascular complication will be avoided Outcome: Progressing   Problem: Activity: Goal: Risk for activity intolerance will decrease Outcome: Progressing   Problem: Nutrition: Goal: Adequate nutrition will be maintained Outcome: Progressing   Problem: Coping: Goal: Level of anxiety will decrease Outcome: Progressing   Problem: Elimination: Goal: Will not experience complications related to bowel motility Outcome: Progressing Goal: Will not experience complications related to urinary retention Outcome: Progressing   Problem: Pain Management: Goal: General experience of comfort will improve Outcome: Progressing   Problem: Safety: Goal: Ability to remain free from injury will improve Outcome: Progressing   Problem: Skin Integrity: Goal: Risk for impaired skin integrity will decrease Outcome: Progressing   Problem: Education: Goal:  Ability to describe self-care measures that may prevent or decrease complications (Diabetes Survival Skills Education) will improve Outcome: Progressing Goal: Individualized Educational Video(s) Outcome: Progressing   Problem: Coping: Goal: Ability to adjust to condition or change in health will improve Outcome: Progressing   Problem: Fluid Volume: Goal: Ability to maintain a balanced intake and output will improve Outcome: Progressing   Problem: Health Behavior/Discharge Planning: Goal: Ability to identify and utilize available resources and services will improve Outcome: Progressing Goal: Ability to manage health-related needs will improve Outcome: Progressing   Problem: Metabolic: Goal: Ability to maintain appropriate glucose levels will improve Outcome: Progressing   Problem: Nutritional: Goal: Maintenance of adequate nutrition will improve Outcome: Progressing Goal: Progress toward achieving an optimal weight will improve Outcome: Progressing   Problem: Skin Integrity: Goal: Risk for impaired skin integrity will decrease Outcome: Progressing   Problem: Tissue Perfusion: Goal: Adequacy of tissue perfusion will improve Outcome: Progressing

## 2023-05-07 DIAGNOSIS — E039 Hypothyroidism, unspecified: Secondary | ICD-10-CM | POA: Diagnosis not present

## 2023-05-07 DIAGNOSIS — N200 Calculus of kidney: Secondary | ICD-10-CM

## 2023-05-07 DIAGNOSIS — N179 Acute kidney failure, unspecified: Secondary | ICD-10-CM | POA: Diagnosis not present

## 2023-05-07 DIAGNOSIS — A498 Other bacterial infections of unspecified site: Secondary | ICD-10-CM | POA: Diagnosis not present

## 2023-05-07 DIAGNOSIS — Z1612 Extended spectrum beta lactamase (ESBL) resistance: Secondary | ICD-10-CM

## 2023-05-07 DIAGNOSIS — N139 Obstructive and reflux uropathy, unspecified: Secondary | ICD-10-CM | POA: Diagnosis not present

## 2023-05-07 LAB — GLUCOSE, CAPILLARY
Glucose-Capillary: 68 mg/dL — ABNORMAL LOW (ref 70–99)
Glucose-Capillary: 124 mg/dL — ABNORMAL HIGH (ref 70–99)
Glucose-Capillary: 180 mg/dL — ABNORMAL HIGH (ref 70–99)
Glucose-Capillary: 187 mg/dL — ABNORMAL HIGH (ref 70–99)

## 2023-05-07 MED ORDER — INSULIN GLARGINE 100 UNIT/ML ~~LOC~~ SOLN: 8.0000 [IU] | Freq: Every day | SUBCUTANEOUS | Status: DC

## 2023-05-07 MED ORDER — MELATONIN 5 MG PO TABS: 5.0000 mg | ORAL_TABLET | Freq: Every evening | ORAL | 0 refills | Status: DC | PRN

## 2023-05-07 MED ORDER — FUROSEMIDE 20 MG PO TABS: 20.0000 mg | ORAL_TABLET | Freq: Every day | ORAL | 5 refills | Status: DC

## 2023-05-07 MED ORDER — ENSURE ENLIVE PO LIQD: 237.0000 mL | Freq: Two times a day (BID) | ORAL | 12 refills | Status: DC

## 2023-05-07 MED ORDER — GERHARDT'S BUTT CREAM: 1.0000 | TOPICAL_CREAM | Freq: Two times a day (BID) | CUTANEOUS | 0 refills | Status: DC

## 2023-05-07 NOTE — TOC Transition Note (Signed)
 Transition of Care [facility]) - Discharge Note   Patient Details  Name: Jodi Reeves MRN: [id number] Date of Birth: [birth year]  [facility] CM/SW Contact:  Corean ONEIDA Haddock, RN Phone Number: 05/07/2023, 1:02 PM   Clinical Narrative:      Patient will DC to: [facility] Anticipated DC date: 05/07/23  Family notified: son TIM Transport ab:[id number]  Per MD patient ready for DC to . RN,, patient's family, and facility notified of DC. Discharge Summary sent to facility. RN given number for report. DC packet on chart. Ambulance transport requested for patient.  TOC signing off.    Final next level of care: Long Term Nursing Home Barriers to Discharge: No Barriers Identified   Patient Goals and CMS Choice            Discharge Placement                       Discharge Plan and Services Additional resources added to the After Visit Summary for       Post Acute Care Choice:  ([facility] facility)                               Social Drivers of Health (SDOH) Interventions SDOH Screenings   Food Insecurity: No Food Insecurity (06/21/2021)  Housing: Low Risk  (06/21/2021)  Transportation Needs: No Transportation Needs (06/21/2021)  Depression (PHQ2-9): Low Risk  (06/21/2021)  Financial Resource Strain: Low Risk  (06/21/2021)  Physical Activity: Insufficiently Active (06/18/2020)  Social Connections: Unknown (06/21/2021)  Stress: No Stress Concern Present (06/21/2021)  Tobacco Use: Low Risk  (04/25/2023)     Readmission Risk Interventions     No data to display

## 2023-05-07 NOTE — Discharge Summary (Signed)
 Physician Discharge Summary   Patient: Jodi Reeves MRN: [id number] DOB: 04-16-1928  Admit date:     04/25/2023  Discharge date: 05/07/23  Discharge Physician: Concepcion Riser   PCP: Perla Evalene PARAS, MD   Recommendations at discharge:    PCP follow up as suggested. Urology follow up for definitive stone management.  Discharge Diagnoses: Principal Problem:   Acute unilateral obstructive uropathy Active Problems:   Septic shock (HCC)   AKI (acute kidney injury) (HCC)   E coli bacteremia   Paroxysmal atrial fibrillation (HCC)   Hypothyroidism   Type 2 diabetes mellitus with peripheral neuropathy (HCC)   Chronic diastolic CHF (congestive heart failure) (HCC)   GERD without esophagitis   Hypokalemia   Kidney stone  Resolved Problems:   * No resolved hospital problems. [facility] Course: Jodi Reeves is a 89 yo female with PMH anxiety, chronic diastolic CHF, CAD, DM II, GERD, HTN, HLD, hypothyroidism, PAF who presented with altered mentation and dysuria.  She resides at skilled nursing facility and her urine began looking more cloudy and she was referred to the ER. On arrival while she was found to be febrile with mild leukocytosis and elevated lactic acid.  UA concerning for infection as well. CT renal stone protocol was performed showing obstructed distal right ureter with 2 mm stone.  Urology was consulted and she underwent cystoscopy with right ureteral stent placement.  Blood cultures after admission also began growing E. coli ESBL.  She had already been started on meropenem  on admission as well after her allergy profile was taken into consideration. After cystoscopy, she developed worsening refractory hypotension despite fluid resuscitation.  Lactic acid further up trended and she was transferred to the ICU for vasopressor support.  Assessment and Plan: Septic Shock ~ RESOLVED  Severe Sepsis  ESBL E. Coli BACTEREMIA  Right Ureteral Stone infection ~ status post  Right Ureteral Stent on 1/1 She remained afebrile. She is weaned off pressors. Urine and blood cutures positive for ESBL E.coli. Finished Meropenem  10 days course for complicated UTI.  Repeat cultures negative. WBC 59 upon presentation improved to normal.  Continue foley care with Urology follow up for ureteroscopy and definitive stone management in about 3 weeks.   Acute Kidney Injury ~ RESOLVED Metabolic Acidosis ~ RESOLVED Hypokalemia ~ RESOLVED Hyponatremia ~ RESOLVED Kidney function and electrolytes improved. Monitor and replete electrolytes as outpatient.   Thrombocytopenia-improving In the setting of sepsis.  No signs of bleeding Platelets improved.   Diabetes Mellitus Type II Sugars stable. A1c 8.0. Resumed long acting 8 units with sliding scale. Moniotor for hypoglycemia.   Hypothyroidism Continue Synthroid .   Acute Metabolic Encephalopathy ~ Improving Her mental status back to baseline, but and off confused per son. Promote normal sleep/wake cycle and family presence Avoid sedating medications as able. Delirium precautions.   Paroxymal Atrial Fibrillation Continue Eliquis , and bisoprolol          Consultants: Urology Procedures performed: s/p right ureteral stent 04/25/23.  Disposition: Skilled nursing facility Diet recommendation:  Discharge Diet Orders (From admission, onward)     Start     Ordered   05/07/23 0000  Diet - low sodium heart healthy        05/07/23 1141   05/07/23 0000  Diet Carb Modified        05/07/23 1141           Cardiac and Carb modified diet DISCHARGE MEDICATION: Allergies as of 05/07/2023       Reactions  Amiodarone  Other (See Comments)   Severe Thyroid  issues    Fosamax [alendronate]    dysphagia   Penicillins Anaphylaxis, Other (See Comments)   TOLERATED CEFAZOLIN  07/06/20 Has patient had a PCN reaction causing immediate rash, facial/tongue/throat swelling, SOB or lightheadedness with hypotension: Yes Has patient  had a PCN reaction causing severe rash involving mucus membranes or skin necrosis: No Has patient had a PCN reaction that required hospitalization: No Has patient had a PCN reaction occurring within the last 10 years: No If all of the above answers are NO, then may proceed with Cephalosporin use.   Sulfa Antibiotics Itching, Rash   Itching   Actos [pioglitazone]    Not effective    Amaryl [glimepiride]    Not effective    Atorvastatin Other (See Comments)   Muscle aches & All statins per Patient   Bentyl  [dicyclomine  Hcl]    Could not tolerate nausea, reduced concentration, h/a   Ciprofloxacin  Other (See Comments)   High blood pressure    Codeine Nausea And Vomiting   Ezetimibe-simvastatin Other (See Comments)   Muscle aches, nausea, back pain    Lovastatin Other (See Comments)   Myalgias   Macrobid  [nitrofurantoin  Macrocrystal]    Severe Itching, rash   Protonix  [pantoprazole  Sodium]    Esophageal problems  Wants removed from list   Rosiglitazone Maleate Other (See Comments)   Edema   Statins    Muscle and joint aches to all statins   Victoza [liraglutide]    Nausea   Alendronate Sodium Rash   Dysphagia and ulceration    Bactrim [sulfamethoxazole-trimethoprim] Rash   itching        Medication List     STOP taking these medications    aspirin  325 MG tablet   bacitracin 500 UNIT/GM ointment   insulin  glargine-yfgn 100 UNIT/ML Pen Commonly known as: SEMGLEE    naproxen  sodium 220 MG tablet Commonly known as: ALEVE    rivaroxaban  20 MG Tabs tablet Commonly known as: XARELTO    Sodium Fluoride 5000 PPM 1.1 % Gel dental gel Generic drug: sodium fluoride       TAKE these medications    acetaminophen  500 MG tablet Commonly known as: TYLENOL  Take 1,000 mg by mouth every 6 (six) hours as needed.   alum & mag hydroxide-simeth 200-200-20 MG/5ML suspension Commonly known as: MAALOX/MYLANTA Take 30 mLs by mouth every 4 (four) hours as needed for  indigestion.   bisoprolol  5 MG tablet Commonly known as: ZEBETA  Take 5 mg by mouth in the morning and at bedtime.   Calcium  Antacid Extra Strength 750 MG chewable tablet Generic drug: calcium  carbonate Chew by mouth.   cyanocobalamin  1000 MCG tablet Commonly known as: VITAMIN B12 Take 1,000 mcg by mouth daily. W/ Folate and B6 sublingual   diclofenac Sodium 1 % Gel Commonly known as: VOLTAREN Apply 2 g topically in the morning, at noon, and at bedtime.   dimethicone-zinc oxide cream Apply topically 2 (two) times daily as needed for dry skin. Please label the bottle for the vaginal area only.   docusate sodium  100 MG capsule Commonly known as: COLACE Take 100 mg by mouth daily as needed for mild constipation.   Eliquis  5 MG Tabs tablet Generic drug: apixaban  Take 5 mg by mouth 2 (two) times daily.   estradiol  0.1 MG/GM vaginal cream Commonly known as: ESTRACE  APPLY 0.5MG  (PEA SIZED AMOUNT) JUST INSIDE THE VAGINA WITH FINGER-TIP ON MONDAY, WEDNESDAY AND FRIDAY NIGHTS.   famotidine  20 MG tablet Commonly known as: PEPCID   Take 20 mg by mouth at bedtime.   feeding supplement Liqd Take 237 mLs by mouth 2 (two) times daily between meals.   Fish Oil 1000 MG Caps Take 1 capsule by mouth 2 (two) times daily.   fluticasone  50 MCG/ACT nasal spray Commonly known as: FLONASE  Place 1 spray into both nostrils daily.   folic acid  1 MG tablet Commonly known as: FOLVITE  Take 1 tablet (1 mg total) by mouth daily. What changed: when to take this   furosemide  20 MG tablet Commonly known as: LASIX  Take 1 tablet (20 mg total) by mouth daily. What changed: when to take this   Gerhardt's butt cream Crea Apply 1 Application topically 2 (two) times daily.   glucose blood test strip Commonly known as: ONE TOUCH ULTRA TEST Use to test blood sugar once daily E11.49   hydrOXYzine  25 MG tablet Commonly known as: ATARAX  Take 25 mg by mouth every 8 (eight) hours as needed for anxiety.    insulin  aspart 100 UNIT/ML injection Commonly known as: novoLOG  Inject 0-15 Units into the skin 4 (four) times daily -  before meals and at bedtime. What changed:  when to take this additional instructions   insulin  glargine 100 UNIT/ML injection Commonly known as: LANTUS  Inject 0.08 mLs (8 Units total) into the skin daily.   levothyroxine  50 MCG tablet Commonly known as: SYNTHROID  Take 50 mcg by mouth daily before breakfast.   magnesium  hydroxide 400 MG/5ML suspension Commonly known as: MILK OF MAGNESIA Take 30 mLs by mouth daily as needed for mild constipation.   Magnesium  Oxide 250 MG Tabs Take 250 mg by mouth daily.   melatonin 5 MG Tabs Take 1 tablet (5 mg total) by mouth at bedtime as needed.   MULTIVITAMIN GUMMIES ADULT PO Take 1 each by mouth daily.   Healthy Eyes Supervision 2 Caps Take 1 capsule by mouth 2 (two) times daily.   nystatin powder Apply 1 Application topically 3 (three) times daily as needed.   Olopatadine  HCl 0.2 % Soln Place 1 drop into both eyes daily as needed (Allergies).   ondansetron  4 MG tablet Commonly known as: ZOFRAN  Take 1 tablet (4 mg total) by mouth every 6 (six) hours as needed for nausea.   psyllium 58.6 % packet Commonly known as: METAMUCIL Take 1 packet by mouth daily as needed (constipation).   pyridOXINE  50 MG tablet Commonly known as: B-6 Take 1 tablet (50 mg total) by mouth daily.   simethicone 80 MG chewable tablet Commonly known as: MYLICON Chew 80 mg by mouth every 6 (six) hours as needed for flatulence.   Tradjenta  5 MG Tabs tablet Generic drug: linagliptin  Take 5 mg by mouth daily.   vitamin C  1000 MG tablet Take 2,000 mg by mouth daily.        Contact information for follow-up providers     [facility] [place]. Schedule an appointment as soon as possible for a visit.   Specialty: Urology Contact information: [address] [place], [address] [place]   [address] [phone]        Perla Evalene PARAS, MD Follow up in 1 week(s).   Specialty: Cardiology Contact information: [address] [place] [address] [phone]              Contact information for after-discharge care     Destination     [facility] [place] .   Service: Skilled Nursing Contact information: [address] [place]  [phone]  [phone]                    Discharge Exam: Jodi Reeves   04/26/23 0112 04/26/23 1219  Weight: 78.1 kg 79.1 kg   General - Elderly Caucasian female, no apparent distress HEENT - PERRLA, EOMI, atraumatic head, non tender sinuses. Lung - Clear, basal rales, diffuse rhonchi, no wheezes. Heart - S1, S2 heard, no murmurs, rubs, 1+ pedal edema. Abdomen - Soft, non tender, bowel sounds good Neuro - Alert, awake and oriented, non focal exam. Skin - Warm and dry.  Condition at discharge: stable  The results of significant diagnostics from this hospitalization (including imaging, microbiology, ancillary and laboratory) are listed below for reference.   Imaging Studies: DG Chest Port 1 View Result Date: 04/28/2023 CLINICAL DATA:  Acute respiratory failure. EXAM: PORTABLE CHEST 1 VIEW COMPARISON:  Chest radiograph dated 04/25/2023. FINDINGS: The heart is enlarged. Vascular calcifications are seen in the aortic arch. There is a small left pleural effusion with associated atelectasis/airspace disease. There is mild right basilar atelectasis/airspace disease. No pneumothorax. Degenerative changes are seen in the spine. IMPRESSION: Small left pleural effusion with associated atelectasis/airspace disease. Mild right basilar atelectasis/airspace disease. Electronically Signed   By: Norman Hopper M.D.   On: 04/28/2023 11:19   CT ABDOMEN PELVIS WO CONTRAST Result Date: 04/27/2023 CLINICAL DATA:  Sepsis EXAM: CT ABDOMEN AND PELVIS WITHOUT CONTRAST TECHNIQUE: Multidetector CT imaging of the  abdomen and pelvis was performed following the standard protocol without IV contrast. RADIATION DOSE REDUCTION: This exam was performed according to the departmental dose-optimization program which includes automated exposure control, adjustment of the mA and/or kV according to patient size and/or use of iterative reconstruction technique. COMPARISON:  04/25/2023 FINDINGS: Lower chest: Small bilateral pleural effusions, new since prior study. Dependent atelectasis in the lower lobes. Coronary artery and aortic atherosclerosis. Cardiomegaly. Hepatobiliary: No focal liver abnormality is seen. Status post cholecystectomy. No biliary dilatation. Pancreas: Atrophy.  No focal abnormality or ductal dilatation. Spleen: No focal abnormality.  Normal size. Adrenals/Urinary Tract: Interval placement of right ureteral stent. Previously seen mid to distal 2 mm right ureteral stone not definitively visualized but could be obscured by the ureteral stent. Mild right caliectasis, improved since prior study. Punctate nonobstructing stone in the midpole of the left kidney. No ureteral stone or hydronephrosis on the left. Foley catheter in the bladder which is decompressed. Adrenal glands unremarkable. Stomach/Bowel: Stomach, large and small bowel grossly unremarkable. Vascular/Lymphatic: Aortic atherosclerosis. No evidence of aneurysm or adenopathy. Left groin venous catheter in place. The tip appears to pass into and internal iliac branch. Reproductive: Prior hysterectomy.  No adnexal masses. Other: Small amount of free fluid in the pelvis.  No free air. Musculoskeletal: Multilevel compression fractures and vertebroplasty changes. No acute bony abnormality or change. IMPRESSION: Interval placement of right ureteral stent with mild caliectasis within the right kidney, slightly improved since prior study. Nonobstructing left nephrolithiasis. Small bilateral pleural effusions with dependent atelectasis in the lower lobes. Coronary  artery disease, aortic atherosclerosis. Small amount of free fluid in the pelvis. Electronically Signed   By: Franky Crease M.D.   On: 04/27/2023 20:03   ECHOCARDIOGRAM COMPLETE Result Date: 04/27/2023    ECHOCARDIOGRAM REPORT   Patient Name:   Jodi Reeves Date of Exam: 04/27/2023 Medical Rec #:  [id number]        Height:       64.0 in Accession #:    [id number]       Weight:  174.4 lb Date of Birth:  1928-01-11         BSA:          1.846 m Patient Age:    89 years         BP:           Not listed in chart/Not listed in                                                chart mmHg Patient Gender: F                HR:           98 bpm. Exam Location:  [facility] Procedure: 2D Echo, Cardiac Doppler and Color Doppler Indications:     Shock R57.9  History:         Patient has prior history of Echocardiogram examinations, most                  recent 10/24/2018. Risk Factors:Diabetes, Hypertension and                  Dyslipidemia. Persistent Afib.  Sonographer:     Mortimer Furnace Referring Phys:  [id number] INGE JONETTA LECHER Diagnosing Phys: Redell Cave MD IMPRESSIONS  1. Left ventricular ejection fraction, by estimation, is 50 to 55%. The left ventricle has low normal function. The left ventricle has no regional wall motion abnormalities. Left ventricular diastolic parameters are indeterminate.  2. Right ventricular systolic function is mildly reduced. The right ventricular size is normal. There is moderately elevated pulmonary artery systolic pressure. The estimated right ventricular systolic pressure is 47.4 mmHg.  3. Left atrial size was moderately dilated.  4. Right atrial size was moderately dilated.  5. A small pericardial effusion is present. There is no evidence of cardiac tamponade.  6. The mitral valve is normal in structure. Mild mitral valve regurgitation.  7. Tricuspid valve regurgitation is moderate.  8. The aortic valve is tricuspid. Aortic valve regurgitation is mild. Aortic valve sclerosis is present, with  no evidence of aortic valve stenosis.  9. The inferior vena cava is normal in size with <50% respiratory variability, suggesting right atrial pressure of 8 mmHg. FINDINGS  Left Ventricle: Left ventricular ejection fraction, by estimation, is 50 to 55%. The left ventricle has low normal function. The left ventricle has no regional wall motion abnormalities. The left ventricular internal cavity size was normal in size. There is no left ventricular hypertrophy. Left ventricular diastolic parameters are indeterminate. Right Ventricle: The right ventricular size is normal. No increase in right ventricular wall thickness. Right ventricular systolic function is mildly reduced. There is moderately elevated pulmonary artery systolic pressure. The tricuspid regurgitant velocity is 3.14 m/s, and with an assumed right atrial pressure of 8 mmHg, the estimated right ventricular systolic pressure is 47.4 mmHg. Left Atrium: Left atrial size was moderately dilated. Right Atrium: Right atrial size was moderately dilated. Pericardium: A small pericardial effusion is present. There is no evidence of cardiac tamponade. Mitral Valve: The mitral valve is normal in structure. Mild mitral valve regurgitation. Tricuspid Valve: The tricuspid valve is normal in structure. Tricuspid valve regurgitation is moderate. Aortic Valve: The aortic valve is tricuspid. Aortic valve regurgitation is mild. Aortic valve sclerosis is present, with no evidence of aortic valve stenosis. Aortic valve mean gradient measures 2.0 mmHg. Aortic  valve peak gradient measures 3.1 mmHg. Aortic valve area, by VTI measures 2.04 cm. Pulmonic Valve: The pulmonic valve was normal in structure. Pulmonic valve regurgitation is mild. Aorta: The aortic root is normal in size and structure. Venous: The inferior vena cava is normal in size with less than 50% respiratory variability, suggesting right atrial pressure of 8 mmHg. IAS/Shunts: No atrial level shunt detected by color  flow Doppler.  LEFT VENTRICLE PLAX 2D LVIDd:         3.30 cm LVIDs:         2.30 cm LV PW:         1.00 cm LV IVS:        1.10 cm LVOT diam:     2.00 cm LV SV:         29 LV SV Index:   16 LVOT Area:     3.14 cm  RIGHT VENTRICLE RV Basal diam:  3.50 cm RV Mid diam:    2.60 cm LEFT ATRIUM           Index        RIGHT ATRIUM           Index LA diam:      5.40 cm 2.93 cm/m   RA Area:     25.30 cm LA Vol (A2C): 49.9 ml 27.04 ml/m  RA Volume:   79.30 ml  42.97 ml/m LA Vol (A4C): 82.0 ml 44.43 ml/m  AORTIC VALVE AV Area (Vmax):    1.88 cm AV Area (Vmean):   1.87 cm AV Area (VTI):     2.04 cm AV Vmax:           88.20 cm/s AV Vmean:          57.700 cm/s AV VTI:            0.141 m AV Peak Grad:      3.1 mmHg AV Mean Grad:      2.0 mmHg LVOT Vmax:         52.70 cm/s LVOT Vmean:        34.400 cm/s LVOT VTI:          0.092 m LVOT/AV VTI ratio: 0.65  AORTA Ao Root diam: 3.10 cm MITRAL VALVE                TRICUSPID VALVE MV Area (PHT): 5.54 cm     TR Peak grad:   39.4 mmHg MV Decel Time: 137 msec     TR Vmax:        314.00 cm/s MV E velocity: 119.00 cm/s                             SHUNTS                             Systemic VTI:  0.09 m                             Systemic Diam: 2.00 cm Redell Cave MD Electronically signed by Redell Cave MD Signature Date/Time: 04/27/2023/1:07:25 PM    Final    DG OR UROLOGY CYSTO IMAGE ([facility] ONLY) Result Date: 04/25/2023 There is no interpretation for this exam.  This order is for images obtained during a surgical procedure.  Please See Surgeries Tab for more information regarding the procedure.  DG OR UROLOGY CYSTO IMAGE ([facility] ONLY) Result Date: 04/25/2023 There is no interpretation for this exam.  This order is for images obtained during a surgical procedure.  Please See Surgeries Tab for more information regarding the procedure.   DG OR UROLOGY CYSTO IMAGE ([facility] ONLY) Result Date: 04/25/2023 There is no interpretation for this exam.  This order is for images  obtained during a surgical procedure.  Please See Surgeries Tab for more information regarding the procedure.   DG OR UROLOGY CYSTO IMAGE ([facility] ONLY) Result Date: 04/25/2023 There is no interpretation for this exam.  This order is for images obtained during a surgical procedure.  Please See Surgeries Tab for more information regarding the procedure.   DG OR UROLOGY CYSTO IMAGE ([facility] ONLY) Result Date: 04/25/2023 There is no interpretation for this exam.  This order is for images obtained during a surgical procedure.  Please See Surgeries Tab for more information regarding the procedure.   CT Renal Stone Study Result Date: 04/25/2023 CLINICAL DATA:  Abdominal/flank pain, stone suspected EXAM: CT ABDOMEN AND PELVIS WITHOUT CONTRAST TECHNIQUE: Multidetector CT imaging of the abdomen and pelvis was performed following the standard protocol without IV contrast. RADIATION DOSE REDUCTION: This exam was performed according to the departmental dose-optimization program which includes automated exposure control, adjustment of the mA and/or kV according to patient size and/or use of iterative reconstruction technique. COMPARISON:  CT abdomen pelvis 01/21/2018 FINDINGS: Lower chest: Small hiatal hernia. Cardiomegaly. Coronary artery calcification. Hepatobiliary: No focal liver abnormality. Status post cholecystectomy. No biliary dilatation. Pancreas: Diffusely atrophic. No focal lesion. Otherwise normal pancreatic contour. No surrounding inflammatory changes. No main pancreatic ductal dilatation. Spleen: Normal in size without focal abnormality. Adrenals/Urinary Tract: No adrenal nodule bilaterally. Right mild hydroureteronephrosis with 2 mm calcified stone within the mid to distal right ureter (2:51 6:47). No right nephrolithiasis. Punctate left nephrolithiasis. No left ureterolithiasis. No left hydronephrosis. The urinary bladder is unremarkable. Stomach/Bowel: Stomach is within normal limits. No evidence of bowel  wall thickening or dilatation. Colonic diverticulosis. The appendix is not definitely identified with no inflammatory changes in the right lower quadrant to suggest acute appendicitis. Vascular/Lymphatic: No abdominal aorta or iliac aneurysm. Severe atherosclerotic plaque of the aorta and its branches. No abdominal, pelvic, or inguinal lymphadenopathy. Reproductive: Status post hysterectomy. No adnexal masses. Other: Chronic periaortic retroperitoneal surgical hardware versus retained radiopaque foreign bodies. No intraperitoneal free fluid. No intraperitoneal free gas. No organized fluid collection. Musculoskeletal: No abdominal wall hernia or abnormality. Diffusely decreased bone density. No suspicious lytic or blastic osseous lesions. No acute displaced fracture. Multilevel severe degenerative changes of the spine. T12, L1, L4 compression fracture status post kyphoplasty. Chronic vertebral height loss at the L5 level. Partially visualized intramedullary nail fixation of the left femur. IMPRESSION: 1. Obstructive 2 mm mid to distal right ureterolithiasis. 2. Nonobstructive punctate left nephrolithiasis. 3. Small hiatal hernia. 4.  Aortic Atherosclerosis (ICD10-I70.0). Electronically Signed   By: Morgane  Naveau M.D.   On: 04/25/2023 19:28   DG Chest Port 1 View Result Date: 04/25/2023 CLINICAL DATA:  Fever EXAM: PORTABLE CHEST 1 VIEW COMPARISON:  01/12/2022 FINDINGS: Single frontal view of the chest demonstrates a stable cardiac silhouette. Areas of linear consolidation are seen at the left lung base, favor scarring or atelectasis. No acute airspace disease, effusion, or pneumothorax. No acute bony abnormalities. Stable postsurgical changes of the left shoulder and thoracolumbar spine. IMPRESSION: 1. Linear consolidation within the left lung base, favor scarring or atelectasis over pneumonia. 2. Otherwise stable exam.  Electronically Signed   By: Ozell Daring M.D.   On: 04/25/2023 19:14   CT HEAD WO CONTRAST  ( ) Result Date: 04/25/2023 CLINICAL DATA:  Headache, new onset (Age >= 51y) EXAM: CT HEAD WITHOUT CONTRAST TECHNIQUE: Contiguous axial images were obtained from the base of the skull through the vertex without intravenous contrast. RADIATION DOSE REDUCTION: This exam was performed according to the departmental dose-optimization program which includes automated exposure control, adjustment of the mA and/or kV according to patient size and/or use of iterative reconstruction technique. COMPARISON:  Head 01/12/2022. CT FINDINGS: Brain: No evidence of acute infarction, hemorrhage, hydrocephalus, extra-axial collection or mass lesion/mass effect. Cerebral atrophy. Vascular: No hyperdense vessel.  Calcific atherosclerosis. Skull: No acute fracture. Sinuses/Orbits: Clear sinuses.  No acute orbital findings. Other: No mastoid effusions. IMPRESSION: No evidence of acute intracranial abnormality. Electronically Signed   By: Gilmore GORMAN Molt M.D.   On: 04/25/2023 19:11    Microbiology: Results for orders placed or performed during the hospital encounter of 04/25/23  Blood culture (routine x 2)     Status: Abnormal   Collection Time: 04/25/23  6:03 PM   Specimen: BLOOD  Result Value Ref Range Status   Specimen Description   Final    BLOOD BLOOD RIGHT ARM Performed at [facility], [address] [place], [place] [address]    Special Requests   Final    BOTTLES DRAWN AEROBIC AND ANAEROBIC Blood Culture adequate volume Performed at [facility], [address], [place], [place] [address]    Culture  Setup Time   Final    GRAM NEGATIVE RODS IN BOTH AEROBIC AND ANAEROBIC BOTTLES CRITICAL RESULT CALLED TO, READ BACK BY AND VERIFIED WITH: WILL ANDERSON 04/26/23 0756 MW Performed at [facility], [address], [place], [place] [address]    Culture (A)  Final    ESCHERICHIA COLI Confirmed Extended Spectrum Beta-Lactamase Producer (ESBL).  In bloodstream infections from ESBL  organisms, carbapenems are preferred over piperacillin/tazobactam. They are shown to have a lower risk of mortality.    Report Status 04/28/2023 FINAL  Final   Organism ID, Bacteria ESCHERICHIA COLI  Final   Organism ID, Bacteria ESCHERICHIA COLI  Final      Susceptibility   Escherichia coli - KIRBY BAUER*    CEFAZOLIN  RESISTANT Resistant    Escherichia coli - MIC*    AMPICILLIN >=32 RESISTANT Resistant     CEFEPIME 16 RESISTANT Resistant     CEFTAZIDIME RESISTANT Resistant     CEFTRIAXONE  >=64 RESISTANT Resistant     CIPROFLOXACIN  1 RESISTANT Resistant     GENTAMICIN  >=16 RESISTANT Resistant     IMIPENEM <=0.25 SENSITIVE Sensitive     TRIMETH/SULFA >=320 RESISTANT Resistant     AMPICILLIN/SULBACTAM 16 INTERMEDIATE Intermediate     PIP/TAZO <=4 SENSITIVE Sensitive ug/mL    * ESCHERICHIA COLI    ESCHERICHIA COLI  Blood culture (routine x 2)     Status: Abnormal   Collection Time: 04/25/23  6:03 PM   Specimen: BLOOD  Result Value Ref Range Status   Specimen Description BLOOD BLOOD LEFT ARM  Final   Special Requests   Final    BOTTLES DRAWN AEROBIC AND ANAEROBIC Blood Culture results may not be optimal due to an inadequate volume of blood received in culture bottles   Culture  Setup Time   Final    GRAM NEGATIVE RODS AEROBIC BOTTLE ONLY CRITICAL RESULT CALLED TO, READ BACK BY AND VERIFIED WITH: WILL ANDERSON 04/26/23 0756  MW    Culture (A)  Final    ESCHERICHIA COLI SUSCEPTIBILITIES PERFORMED ON PREVIOUS CULTURE WITHIN THE LAST 5 DAYS.    Report Status 05/06/2023 FINAL  Final  Urine Culture     Status: Abnormal   Collection Time: 04/25/23  6:03 PM   Specimen: Urine, Clean Catch  Result Value Ref Range Status   Specimen Description   Final    URINE, CLEAN CATCH Performed at [facility], [address], [place], [place] [address]    Special Requests   Final    NONE Performed at [facility], [address] [place], [place] [address]    Culture (A)   Final    >=100,000 COLONIES/mL ESCHERICHIA COLI Confirmed Extended Spectrum Beta-Lactamase Producer (ESBL).  In bloodstream infections from ESBL organisms, carbapenems are preferred over piperacillin/tazobactam. They are shown to have a lower risk of mortality.    Report Status 04/29/2023 FINAL  Final   Organism ID, Bacteria ESCHERICHIA COLI (A)  Final      Susceptibility   Escherichia coli - MIC*    AMPICILLIN >=32 RESISTANT Resistant     CEFAZOLIN  >=64 RESISTANT Resistant     CEFEPIME 16 RESISTANT Resistant     CEFTRIAXONE  >=64 RESISTANT Resistant     CIPROFLOXACIN  1 RESISTANT Resistant     GENTAMICIN  >=16 RESISTANT Resistant     IMIPENEM <=0.25 SENSITIVE Sensitive     NITROFURANTOIN  <=16 SENSITIVE Sensitive     TRIMETH/SULFA >=320 RESISTANT Resistant     AMPICILLIN/SULBACTAM 16 INTERMEDIATE Intermediate     PIP/TAZO <=4 SENSITIVE Sensitive ug/mL    * >=100,000 COLONIES/mL ESCHERICHIA COLI  Resp panel by RT-PCR (RSV, Flu A&B, Covid) Urine, Clean Catch     Status: None   Collection Time: 04/25/23  6:03 PM   Specimen: Urine, Clean Catch; Nasal Swab  Result Value Ref Range Status   SARS Coronavirus 2 by RT PCR NEGATIVE NEGATIVE Final    Comment: (NOTE) SARS-CoV-2 target nucleic acids are NOT DETECTED.  The SARS-CoV-2 RNA is generally detectable in upper respiratory specimens during the acute phase of infection. The lowest concentration of SARS-CoV-2 viral copies this assay can detect is 138 copies/mL. A negative result does not preclude SARS-Cov-2 infection and should not be used as the sole basis for treatment or other patient management decisions. A negative result may occur with  improper specimen collection/handling, submission of specimen other than nasopharyngeal swab, presence of viral mutation(s) within the areas targeted by this assay, and inadequate number of viral copies(<138 copies/mL). A negative result must be combined with clinical observations, patient history,  and epidemiological information. The expected result is Negative.  Fact Sheet for Patients:  bloggercourse.com  Fact Sheet for Healthcare Providers:  seriousbroker.it  This test is no t yet approved or cleared by the [place]  FDA and  has been authorized for detection and/or diagnosis of SARS-CoV-2 by FDA under an Emergency Use Authorization (EUA). This EUA will remain  in effect (meaning this test can be used) for the duration of the COVID-19 declaration under Section 564(b)(1) of the Act, 21 U.S.C.section 360bbb-3(b)(1), unless the authorization is terminated  or revoked sooner.       Influenza A by PCR NEGATIVE NEGATIVE Final   Influenza B by PCR NEGATIVE NEGATIVE Final    Comment: (NOTE) The Xpert Xpress SARS-CoV-2/FLU/RSV plus assay is intended as an aid in the diagnosis of influenza from Nasopharyngeal swab specimens and should not be used as a sole basis for treatment. Nasal washings  and aspirates are unacceptable for Xpert Xpress SARS-CoV-2/FLU/RSV testing.  Fact Sheet for Patients: bloggercourse.com  Fact Sheet for Healthcare Providers: seriousbroker.it  This test is not yet approved or cleared by the [place]  FDA and has been authorized for detection and/or diagnosis of SARS-CoV-2 by FDA under an Emergency Use Authorization (EUA). This EUA will remain in effect (meaning this test can be used) for the duration of the COVID-19 declaration under Section 564(b)(1) of the Act, 21 U.S.C. section 360bbb-3(b)(1), unless the authorization is terminated or revoked.     Resp Syncytial Virus by PCR NEGATIVE NEGATIVE Final    Comment: (NOTE) Fact Sheet for Patients: bloggercourse.com  Fact Sheet for Healthcare Providers: seriousbroker.it  This test is not yet approved or cleared by the [place]  FDA and has  been authorized for detection and/or diagnosis of SARS-CoV-2 by FDA under an Emergency Use Authorization (EUA). This EUA will remain in effect (meaning this test can be used) for the duration of the COVID-19 declaration under Section 564(b)(1) of the Act, 21 U.S.C. section 360bbb-3(b)(1), unless the authorization is terminated or revoked.  Performed at [facility], [address] [place], [place] [address]   Blood Culture ID Panel (Reflexed)     Status: Abnormal   Collection Time: 04/25/23  6:03 PM  Result Value Ref Range Status   Enterococcus faecalis NOT DETECTED NOT DETECTED Final   Enterococcus Faecium NOT DETECTED NOT DETECTED Final   Listeria monocytogenes NOT DETECTED NOT DETECTED Final   Staphylococcus species NOT DETECTED NOT DETECTED Final   Staphylococcus aureus (BCID) NOT DETECTED NOT DETECTED Final   Staphylococcus epidermidis NOT DETECTED NOT DETECTED Final   Staphylococcus lugdunensis NOT DETECTED NOT DETECTED Final   Streptococcus species NOT DETECTED NOT DETECTED Final   Streptococcus agalactiae NOT DETECTED NOT DETECTED Final   Streptococcus pneumoniae NOT DETECTED NOT DETECTED Final   Streptococcus pyogenes NOT DETECTED NOT DETECTED Final   A.calcoaceticus-baumannii NOT DETECTED NOT DETECTED Final   Bacteroides fragilis NOT DETECTED NOT DETECTED Final   Enterobacterales DETECTED (A) NOT DETECTED Final    Comment: Enterobacterales represent a large order of gram negative bacteria, not a single organism. CRITICAL RESULT CALLED TO, READ BACK BY AND VERIFIED WITH: WILL ANDERSON 04/26/23 0756 MW    Enterobacter cloacae complex NOT DETECTED NOT DETECTED Final   Escherichia coli DETECTED (A) NOT DETECTED Final    Comment: CRITICAL RESULT CALLED TO, READ BACK BY AND VERIFIED WITH: WILL ANDERSON 04/26/23 0756 MW    Klebsiella aerogenes NOT DETECTED NOT DETECTED Final   Klebsiella oxytoca NOT DETECTED NOT DETECTED Final   Klebsiella pneumoniae NOT DETECTED NOT  DETECTED Final   Proteus species NOT DETECTED NOT DETECTED Final   Salmonella species NOT DETECTED NOT DETECTED Final   Serratia marcescens NOT DETECTED NOT DETECTED Final   Haemophilus influenzae NOT DETECTED NOT DETECTED Final   Neisseria meningitidis NOT DETECTED NOT DETECTED Final   Pseudomonas aeruginosa NOT DETECTED NOT DETECTED Final   Stenotrophomonas maltophilia NOT DETECTED NOT DETECTED Final   Candida albicans NOT DETECTED NOT DETECTED Final   Candida auris NOT DETECTED NOT DETECTED Final   Candida glabrata NOT DETECTED NOT DETECTED Final   Candida krusei NOT DETECTED NOT DETECTED Final   Candida parapsilosis NOT DETECTED NOT DETECTED Final   Candida tropicalis NOT DETECTED NOT DETECTED Final   Cryptococcus neoformans/gattii NOT DETECTED NOT DETECTED Final   CTX-M ESBL DETECTED (A) NOT DETECTED Final    Comment: CRITICAL RESULT CALLED TO, READ BACK BY AND  VERIFIED WITH: WILL ANDERSON 04/26/23 0756 MW (NOTE) Extended spectrum beta-lactamase detected. Recommend a carbapenem as initial therapy.      Carbapenem resistance IMP NOT DETECTED NOT DETECTED Final   Carbapenem resistance KPC NOT DETECTED NOT DETECTED Final   Carbapenem resistance NDM NOT DETECTED NOT DETECTED Final   Carbapenem resist OXA 48 LIKE NOT DETECTED NOT DETECTED Final   Carbapenem resistance VIM NOT DETECTED NOT DETECTED Final    Comment: Performed at [facility], [address], [place], [place] [address]  Urine Culture     Status: Abnormal   Collection Time: 04/25/23 11:09 PM   Specimen: Urine, Catheterized  Result Value Ref Range Status   Specimen Description   Final    URINE, CATHETERIZED Performed at [facility], [address], [place], [place] [address]    Special Requests   Final    NONE Performed at [facility], [address] [place], [place] [address]    Culture (A)  Final    70,000 COLONIES/mL ESCHERICHIA COLI Confirmed Extended Spectrum Beta-Lactamase  Producer (ESBL).  In bloodstream infections from ESBL organisms, carbapenems are preferred over piperacillin/tazobactam. They are shown to have a lower risk of mortality.    Report Status 04/28/2023 FINAL  Final   Organism ID, Bacteria ESCHERICHIA COLI (A)  Final      Susceptibility   Escherichia coli - MIC*    AMPICILLIN >=32 RESISTANT Resistant     CEFAZOLIN  >=64 RESISTANT Resistant     CEFEPIME 16 RESISTANT Resistant     CEFTRIAXONE  >=64 RESISTANT Resistant     CIPROFLOXACIN  1 RESISTANT Resistant     GENTAMICIN  >=16 RESISTANT Resistant     IMIPENEM <=0.25 SENSITIVE Sensitive     NITROFURANTOIN  <=16 SENSITIVE Sensitive     TRIMETH/SULFA >=320 RESISTANT Resistant     AMPICILLIN/SULBACTAM 16 INTERMEDIATE Intermediate     PIP/TAZO <=4 SENSITIVE Sensitive ug/mL    * 70,000 COLONIES/mL ESCHERICHIA COLI  MRSA Next Gen by PCR, Nasal     Status: Abnormal   Collection Time: 04/26/23 12:24 PM   Specimen: Nasal Mucosa; Nasal Swab  Result Value Ref Range Status   MRSA by PCR Next Gen DETECTED (A) NOT DETECTED Corrected    Comment: RESULT CALLED TO, READ BACK BY AND VERIFIED WITH: ERICA MORALES 04/26/23 1355 MW Performed at [facility], [address] [place], [place] [address] CORRECTED ON 01/03 AT 9361: PREVIOUSLY REPORTED AS DETECTED RESULT CALLED TO, READ BACK BY AND VERIFIED WITH: ERICA MORALES 04/25/22 1355 MW   Culture, blood (Routine X 2) w Reflex to ID Panel     Status: None   Collection Time: 04/30/23  8:53 AM   Specimen: BLOOD RIGHT HAND  Result Value Ref Range Status   Specimen Description BLOOD RIGHT HAND  Final   Special Requests   Final    BOTTLES DRAWN AEROBIC AND ANAEROBIC Blood Culture adequate volume   Culture   Final    NO GROWTH 5 DAYS Performed at [facility], [address] [place], [place] [address]    Report Status 05/05/2023 FINAL  Final  Culture, blood (Routine X 2) w Reflex to ID Panel     Status: None   Collection Time: 04/30/23  9:02 AM    Specimen: BLOOD RIGHT HAND  Result Value Ref Range Status   Specimen Description BLOOD RIGHT HAND  Final   Special Requests   Final    BOTTLES DRAWN AEROBIC AND ANAEROBIC Blood Culture adequate volume  Culture   Final    NO GROWTH 5 DAYS Performed at [facility], [address] [place], [place] [address]    Report Status 05/05/2023 FINAL  Final    Labs: CBC: Recent Labs  Lab 05/01/23 0447 05/02/23 0558 05/03/23 0330 05/05/23 0610  WBC 5.5 5.0 5.3 5.0  NEUTROABS 3.7  --  3.0  --   HGB 10.8* 11.2* 10.5* 11.3*  HCT 32.3* 33.2* 30.9* 33.2*  MCV 100.3* 100.6* 98.7 97.4  PLT 62* 72* 87* 135*   Basic Metabolic Panel: Recent Labs  Lab 05/01/23 0447 05/02/23 0558 05/03/23 0330 05/05/23 0610  NA 140 144 140 134*  K 3.7 3.6 4.1 4.7  CL 102 106 104 104  CO2 26 28 29 22   GLUCOSE 98 78 89 124*  BUN 36* 23 20 19   CREATININE 0.58 0.45 0.51 0.58  CALCIUM  7.7* 8.1* 7.7* 7.6*  MG 1.9 1.8  --   --   PHOS 2.6 2.6  --   --    Liver Function Tests: Recent Labs  Lab 05/01/23 0447  AST 33  ALT 13  ALKPHOS 132*  BILITOT 0.5  PROT 5.2*  ALBUMIN 1.9*   CBG: Recent Labs  Lab 05/06/23 1558 05/06/23 1707 05/06/23 2021 05/07/23 0802 05/07/23 0900  GLUCAP 264* 223* 211* 68* 124*    Discharge time spent: 38 minutes.  Signed: Concepcion Riser, MD [facility] 05/07/2023

## 2023-05-07 NOTE — Progress Notes (Signed)
 Report given to [facility]. Patient is awaiting EMS for transport. Patient is fourth on the list.   Madie Reno, RN

## 2023-05-07 NOTE — Inpatient Diabetes Management (Signed)
 Inpatient Diabetes Program Recommendations  AACE/ADA: New Consensus Statement on Inpatient Glycemic Control (2015)  Target Ranges:  Prepandial:   less than 140 mg/dL      Peak postprandial:   less than 180 mg/dL (1-2 hours)      Critically ill patients:  140 - 180 mg/dL    Latest Reference Range & Units 05/07/23 08:02  Glucose-Capillary 70 - 99 mg/dL 68 (L)  (L): Data is abnormally low      Home DM Meds: Lantus  8 units daily Tradjenta  5 mg daily Novolog  2-12 units QID   Current Orders:  Semglee  8 units daily Novolog  Moderate Correction Scale/ SSI (0-15 units) TID AC   MD- Note mild Hypoglycemia this AM.  Please consider reducing the Semglee  to 5 units daily    --Will follow patient during hospitalization--  Adina Rudolpho Arrow RN, MSN, CDCES Diabetes Coordinator Inpatient Glycemic Control Team Team Pager: [phone] (8a-5p)

## 2023-05-07 NOTE — Progress Notes (Signed)
 Patient discharged. EMS transporting patient to [facility].   Madie Reno, RN

## 2023-05-07 NOTE — Progress Notes (Signed)
 Hypoglycemic Event  CBG: 68   Treatment: 4 oz juice/soda  Symptoms: None  Follow-up CBG: Time:0900 CBG Result:124   Possible Reasons for Event: Unknown  Comments/MD notified:N/A     Madie Reno, RN

## 2023-05-07 NOTE — Plan of Care (Signed)
  Problem: Nutrition: Goal: Adequate nutrition will be maintained Outcome: Progressing   Problem: Coping: Goal: Level of anxiety will decrease Outcome: Progressing   Problem: Elimination: Goal: Will not experience complications related to bowel motility Outcome: Progressing Goal: Will not experience complications related to urinary retention Outcome: Progressing   Problem: Safety: Goal: Ability to remain free from injury will improve Outcome: Progressing   

## 2023-05-09 ENCOUNTER — Other Ambulatory Visit: Payer: Self-pay

## 2023-05-09 ENCOUNTER — Telehealth: Payer: Self-pay

## 2023-05-09 DIAGNOSIS — N201 Calculus of ureter: Secondary | ICD-10-CM

## 2023-05-09 NOTE — Progress Notes (Signed)
   [facility] Surgical Posting Form  Surgery Date: 05/25/2023  Surgeon: Dr. Jay Meth, MD  Inpt ( No  )   Outpt (Yes)   Obs ( No  )   Diagnosis: N20.1 Right Ureteral Stone   -CPT: [phone]  Surgery: Right Ureteroscopy with Laser Lithotripsy and Stent Exchange  Stop Anticoagulations: No, may continue all  Cardiac/Medical/Pulmonary Clearance needed: no  *Orders entered into EPIC  Date: 05/09/23   *Case booked in [place]  Date: 05/09/23  *Notified pt of Surgery: Date: 05/09/23  PRE-OP UA & CX: yes, will be obtained at [facility]  *Placed into Prior Authorization Work [place] Date: 05/09/23  Assistant/laser/rep:No

## 2023-05-09 NOTE — Telephone Encounter (Signed)
 Per Dr. Estanislao Heimlich,  Patient is to be scheduled for  Right Ureteroscopy with Laser Lithotripsy and Stent Exchange    Mrs. Jodi Reeves, Son- Tim and Facility ([facility]) were contacted and possible surgical dates were discussed, Friday January 31st, 2025 was agreed upon for surgery.   Patient was directed to call [phone] between 1-3pm the day before surgery to find out surgical arrival time.  Instructions were given not to eat or drink from midnight on the night before surgery and have a driver for the day of surgery. On the surgery day patient was instructed to enter through the [facility] entrance of [facility] 2 report the Same Day Surgery desk.   Pre-Admit Testing will be in contact via phone to set up an interview with the anesthesia team to review your history and medications prior to surgery.   Reminder of this information was sent via mail to the patient.

## 2023-05-09 NOTE — Progress Notes (Signed)
 Surgical Physician Order Form [facility]  Dr. Jay Meth, MD  * Scheduling expectation : 2-4 weeks  *Length of Case: 1 hour  *Clearance needed: no  *Anticoagulation Instructions: May continue all anticoagulants  *Aspirin  Instructions: Ok to continue all  *Post-op visit Date/Instructions:  tbd  *Diagnosis: Right Ureteral Stone  *Procedure: right  Ureteroscopy w/laser lithotripsy & stent exchange ([address]109)   Additional orders: N/A  -Admit type: OUTpatient  -Anesthesia: General  -VTE Prophylaxis Standing Order SCD's       Other:   -Standing Lab Orders Per Anesthesia    Lab other: UA&Urine Culture  -Standing Test orders EKG/Chest x-ray per Anesthesia       Test other:   - Medications:  Cipro  400mg  IV  -Other orders:  N/A

## 2023-05-14 NOTE — Progress Notes (Unsigned)
Cardiology Clinic Note   Date: 05/15/2023 ID: Jodi, Reeves Oct 14, 1927, MRN [id number]  Primary Cardiologist:  Julien Nordmann, MD  Patient Profile    Jodi Reeves is a 89 y.o. female who presents to the clinic today for routine follow up.     Past medical history significant for: Nonobstructive CAD. LHC August 2005: LAD 10%, LCx 20%, RCA 40%. Nuclear stress test December 2015: Small region of mild ischemia in the apical region with WMA also noted in the apical region, EF 60%. Nuclear stress test 04/06/2017: Horizontal ST segment depression noted during stress and II, III, aVF leads.  Normal, low risk study. Chronic diastolic heart failure. Echo 04/27/2019: EF 50 to 55%.  No RWMA.  Indeterminate diastolic parameters.  Mildly reduced RV function.  Moderately elevated PA pressure, RVSP 47.4 mmHg.  Moderate BAE.  Small pericardial effusion without cardiac tamponade.  Mild MR/AI.  Moderate TR.  Aortic valve sclerosis without stenosis. Permanent A-fib. Onset 2005. PAD. ABI 09/04/2019: Noncompressible bilateral lower extremity arteries. Chronic venous insufficiency. Venous ultrasound lower extremities 08/07/2019: No evidence of DVT or superficial venous thrombosis bilaterally.  Venous reflux noted in the right common femoral vein, right saphenofemoral junction.  Reflux noted in the left common femoral vein, left saphenofemoral junction, left greater saphenous vein in the thigh. Hypertension. Hyperlipidemia. TIA. Ovarian cancer. Hypothyroidism. T2DM. GERD. Frequent falls.  In summary, patient has a history of A-fib diagnosed in 2005.  She underwent cardiac catheterization in August 2005 which showed nonobstructive CAD as outlined above.  Nuclear stress testing December 2015 showed a small region of mild ischemia as detailed above.  She declined invasive evaluation at that time.  In 2017 she was evaluated at [facility] for TIA with carotid ultrasound showing mild bilateral disease with no  significant stenosis and echo demonstrating EF 65%.  Stress testing and December 2018 was a normal, low risk study.  Echo January 2021 showed normal LV function with mildly reduced RV function, moderately elevated PA pressure, moderate BAE (further details above).     History of Present Illness    Jodi Reeves is followed by Dr. Mariah Milling for the above outlined history.   Patient was last seen in the office by Dr. Mariah Milling on 02/06/2022 for routine follow up. She was doing well at that time and no changes were made.   Patient underwent hospital admission from 04/26/2023 to 05/07/2023 for obstructive uropathy and sepsis. She was discharged back to SNF.   Today, patient is a poor historian. She reports since being hospitalized she feels her memory is very poor. She reports back and chest pain that sound chronic in nature. When describing chest pain she indicates pain in her shoulder and on the left side. She was able to get up and walk today at the nursing home without pain or dyspnea. She has chronic lower extremity edema with a history of lymphedema. She reports edema somewhat worse since getting out of the hospital. Nursing home aide present for the visit reports she sits in a dependent position much of the time and rare props her legs up. She has lymphedema wraps in place today. She denies orthopnea or PND. Patient states she uses her legs to navigate her wheelchair to get some movement in. She has an indwelling foley that she states has been in for a week. Patient then talks about not getting pain medication in the nursing home. Reviewed medication list and showed her that she can receive Tylenol. She says she cannot take  it because it causes indigestion. Pointed out that she can have Tums as needed. She then states that Dr. Mariah Milling told her he would give her medication for an "infection." Patient was not seen by cardiology in the hospital and has not seen Dr. Mariah Milling since October 2023. Attempted to  reorient patient.       ROS: All other systems reviewed and are otherwise negative except as noted in History of Present Illness.  EKGs/Labs Reviewed    EKG Interpretation Date/Time:  Tuesday May 15 2023 14:27:56 EST Ventricular Rate:  69 PR Interval:    QRS Duration:  78 QT Interval:  400 QTC Calculation: 428 R Axis:   -42  Text Interpretation: Atrial fibrillation Left axis deviation Inferior infarct , age undetermined Anterolateral infarct , age undetermined When compared with ECG of 01-May-2023 06:42, PREVIOUS ECG IS PRESENT Confirmed by Carlos Levering [phone]) on 05/15/2023 2:34:49 PM   05/01/2023: ALT 13; AST 33 05/05/2023: BUN 19; Creatinine, Ser 0.58; Potassium 4.7; Sodium 134   05/05/2023: Hemoglobin 11.3; WBC 5.0   04/26/2023: TSH 2.057    Risk Assessment/Calculations     CHA2DS2-VASc Score = 9   This indicates a 12.2% annual risk of stroke. The patient's score is based upon: CHF History: 1 HTN History: 1 Diabetes History: 1 Stroke History: 2 Vascular Disease History: 1 Age Score: 2 Gender Score: 1             Physical Exam    VS:  BP 120/60 (BP Location: Left Arm, Patient Position: Sitting, Cuff Size: Large)   Pulse 69   Ht 5\' 3"  (1.6 m)   Wt 181 lb 6 oz (82.3 kg)   SpO2 97%   BMI 32.13 kg/m  , BMI Body mass index is 32.13 kg/m.  GEN: Well nourished, well developed, in no acute distress. Neck: No JVD or carotid bruits. Cardiac: Irregular rhythm. No murmurs. No rubs or gallops.   Respiratory:  Respirations regular and unlabored. Clear to auscultation without rales, wheezing or rhonchi. GI: Soft, nontender, nondistended. Extremities: Radials 2+ and equal bilaterally. No clubbing or cyanosis. Lymphedema wraps in place.   Skin: Warm and dry, no rash. Neuro: Strength intact.  Assessment & Plan   Nonobstructive CAD LHC August 2005 showed LAD 10%, LCx 20%, RCA 40%. Nuclear stress test December 2018 was a normal, low risk study. Patient reports  chest pain and back pain that sound chronic in nature. When describing chest pain she indicates her shoulder and left side. She reports walking in the nursing home today without pain or dyspnea. She also reports using her legs to navigate in her wheelchair. She works with PT at the nursing home. No cardiac testing indicated at this time. -Continue bisoprolol. Not on aspirin secondary to Eliquis.   Chronic diastolic heart failure Echo January 2021 showed EF 50-55%, no RWMA, mildly reduced RV function, moderately elevated PA pressure, moderate BAE, mild MR/AI. Patient has chronic lower extremity edema secondary to lymphedema. She reports increased edema since hospital admission. Nursing home aide present for patient's visit reports she sits with her legs in a dependent position much of the day typically only elevating her legs when she goes to bed at night. Patient denies orthopnea or PND and aide confirms she sleeps on one pillow.  Euvolemic and well compensated on exam.  -Continue Lasix.   Permanent Afib Onset 2005. Patient denies palpitations.  EKG today shows afib 69 bpm. No bleeding reported.  -Continue bisoprolol, Eliquis. Appropriate Eliquis dose.  Hypertension BP today 120/60.  -Continue bisoprolol.   Disposition: Return in 6 months or sooner as needed.          Signed, Etta Grandchild. Taiven Greenley, DNP, NP-C

## 2023-05-15 ENCOUNTER — Encounter: Payer: Self-pay | Admitting: Student

## 2023-05-15 ENCOUNTER — Ambulatory Visit: Payer: Medicare HMO | Attending: Student | Admitting: Student

## 2023-05-15 VITALS — BP 120/60 | HR 69 | Ht 63.0 in | Wt 181.4 lb

## 2023-05-15 DIAGNOSIS — I2510 Atherosclerotic heart disease of native coronary artery without angina pectoris: Secondary | ICD-10-CM | POA: Diagnosis not present

## 2023-05-15 DIAGNOSIS — I5032 Chronic diastolic (congestive) heart failure: Secondary | ICD-10-CM | POA: Diagnosis not present

## 2023-05-15 DIAGNOSIS — I4821 Permanent atrial fibrillation: Secondary | ICD-10-CM

## 2023-05-15 DIAGNOSIS — I10 Essential (primary) hypertension: Secondary | ICD-10-CM

## 2023-05-15 NOTE — Patient Instructions (Signed)
Medication Instructions:  No changes at this time.   *If you need a refill on your cardiac medications before your next appointment, please call your pharmacy*   Lab Work: None  If you have labs (blood work) drawn today and your tests are completely normal, you will receive your results only by: MyChart Message (if you have MyChart) OR A paper copy in the mail If you have any lab test that is abnormal or we need to change your treatment, we will call you to review the results.   Testing/Procedures: None   Follow-Up: At [facility], you and your health needs are our priority.  As part of our continuing mission to provide you with exceptional heart care, we have created designated Provider Care Teams.  These Care Teams include your primary Cardiologist (physician) and Advanced Practice Providers (APPs -  Physician Assistants and Nurse Practitioners) who all work together to provide you with the care you need, when you need it.  We recommend signing up for the patient portal called "MyChart".  Sign up information is provided on this After Visit Summary.  MyChart is used to connect with patients for Virtual Visits (Telemedicine).  Patients are able to view lab/test results, encounter notes, upcoming appointments, etc.  Non-urgent messages can be sent to your provider as well.   To learn more about what you can do with MyChart, go to [email or web address].    Your next appointment:   6 month(s)  Provider:   Julien Nordmann, MD or Carlos Levering, NP

## 2023-05-16 ENCOUNTER — Telehealth: Payer: Self-pay

## 2023-05-16 NOTE — Telephone Encounter (Signed)
-----   Message from Sondra Come sent at 05/16/2023  8:12 AM EST ----- Regarding: Antibiotics preop Please start nitrofurantoin 100 mg twice daily x 7 days to sterilize urine prior to upcoming ureteroscopy.  Unfortunately this is the only oral antibiotic that prior bacteria was sensitive to and she does have an allergy of itching to the nitrofurantoin.  Can take Benadryl if she is having itching or rash with that medication  Legrand Rams, MD 05/16/2023 ----- Message ----- From: Maryan Puls Sent: 05/14/2023   2:54 PM EST To: Sondra Come, MD; Letta Kocher, CMA

## 2023-05-16 NOTE — Telephone Encounter (Signed)
Spoke with Steward Drone, RN at [facility]. Verbal order given for Macrobid 100mg  BID X 7 Days. Will start the order today. Advised if itching occurs to give benadryl. Verbalized understanding.

## 2023-05-17 ENCOUNTER — Encounter
Admission: RE | Admit: 2023-05-17 | Discharge: 2023-05-17 | Disposition: A | Discharge status: Home / Self Care | Payer: Medicare HMO | Source: Ambulatory Visit | Attending: Urology | Admitting: Urology

## 2023-05-17 HISTORY — DX: Dysphagia, unspecified: R13.10

## 2023-05-17 HISTORY — DX: Esophageal obstruction: K22.2

## 2023-05-17 HISTORY — DX: Deficiency of other specified B group vitamins: E53.8

## 2023-05-17 NOTE — Progress Notes (Signed)
Awaiting medication list fax from [facility]. Contacted Maria,staff nurse at the facility.

## 2023-05-17 NOTE — Patient Instructions (Addendum)
Your procedure is scheduled on: Jan 31/2025- Friday Report to the Registration Desk on the 1st floor of the [facility] To find out your arrival time, please call [phone] between 1PM - 3PM on: Thursday, Jan 30  If your arrival time is 6:00 am, do not arrive before that time as the [facility] entrance doors do not open until 6:00 am.  REMEMBER: Instructions that are not followed completely may result in serious medical risk, up to and including death; or upon the discretion of your surgeon and anesthesiologist your surgery may need to be rescheduled.  Do not eat food after midnight the night before surgery.  No gum chewing or hard candies.   One week prior to surgery: Stop Anti-inflammatories (NSAIDS) such as Advil, Aleve, Ibuprofen, Motrin, Naproxen, Naprosyn and Aspirin based products such as Excedrin, Goody's Powder, BC Powder. Stop ANY OVER THE COUNTER supplements until after surgery like Ascorbic Acid ,Cyanocobalamin , folic acid (FOLVITE), Multiple Vitamins-Minerals, Omega-3 Fatty Acids,  magnesium oxide.  You may however, continue to take Tylenol if needed for pain up until the day of surgery.                                     Follow guidelines for insulin and diabetes medications                             Take half dose of    insulin glargine-yfgn   at bedtime night before surgery.                             Do not take Trajenta on day of surgery/procedure   Continue taking all of your other prescription medications up until the day of surgery.                          Continue Taking Eliquis BUT DO NOT take it on Day of surgery.    ON THE DAY OF SURGERY ONLY TAKE THESE MEDICATIONS WITH SIPS OF WATER:  bisoprolol (ZEBETA)  (SYNTHROID, LEVOTHROID)     No Alcohol for 24 hours before or after surgery.  No Smoking including e-cigarettes for 24 hours before surgery.  No chewable tobacco products for at least 6 hours before surgery. levothyroxine  No nicotine  patches on the day of surgery.  Do not use any "recreational" drugs for at least a week (preferably 2 weeks) before your surgery.  Please be advised that the combination of cocaine and anesthesia may have negative outcomes, up to and including death. If you test positive for cocaine, your surgery will be cancelled.  On the morning of surgery brush your teeth with toothpaste and water, you may rinse your mouth with mouthwash if you wish. Do not swallow any toothpaste or mouthwash.  Please shower on day of surgery.  Do not wear jewelry, make-up, hairpins, clips or nail polish.  Do not wear lotions, powders, or perfumes.   Contact lenses, hearing aids and dentures may not be worn into surgery.  Do not bring valuables to the hospital. [facility] is not responsible for any missing/lost belongings or valuables.    Notify your doctor if there is any change in your medical condition (cold, fever, infection).  Wear comfortable clothing (specific to your surgery type) to  the hospital.  After surgery, you can help prevent lung complications by doing breathing exercises.  Take deep breaths and cough every 1-2 hours. Your doctor may order a device called an Incentive Spirometer to help you take deep breaths.  If you are being discharged the day of surgery, you will not be allowed to drive home. You will need a responsible individual to drive you home and stay with you for 24 hours after surgery.    Please call the Pre-admissions Testing Dept. at [phone] if you have any questions about these instructions.  Surgery Visitation Policy:  Patients having surgery or a procedure may have two visitors.  Children under the age of 88 must have an adult with them who is not the patient.  Temporary Visitor Restrictions Due to increasing cases of flu, RSV and COVID-19: Children ages 88 and under will not be able to visit patients in [facility] hospitals under most circumstances.  Inpatient  Visitation:    Visiting hours are 7 a.m. to 8 p.m. Up to four visitors are allowed at one time in a patient room. The visitors may rotate out with other people during the day.  One visitor age 18 or older may stay with the patient overnight and must be in the room by 8 p.m.

## 2023-05-17 NOTE — Pre-Procedure Instructions (Signed)
Pre-admission interview incomplete however, medication list and medical  history has been updated in the system utilizing the medical records faxed over by [facility]. Pre-anesthesia instructions given to staff nurse Lowanda Foster) [phone] and she verbalized understanding. This RN will fax the patient instructions to the facility faxed number  [phone] per protocol. Encourage the nurse to give [place] a call tomorrow if they have not received the patient instructions before the end of her shift. Per [place], the facility will be transporting patient to the hospital on day of surgery. HCPOA-Son Tim has been updated with the situation.

## 2023-05-18 ENCOUNTER — Telehealth: Payer: Self-pay | Admitting: *Deleted

## 2023-05-18 ENCOUNTER — Encounter: Payer: Self-pay | Admitting: Urgent Care

## 2023-05-18 NOTE — Telephone Encounter (Signed)
Spoke with a Nurse at [facility] who is aware of needed appointment. Nurse said due to Neurovirus alert she busy at the moment and will call back direct number [phone] if by today but if not [phone] leave a message

## 2023-05-18 NOTE — Telephone Encounter (Signed)
-----   Message from Verlee Monte sent at 05/18/2023  8:51 AM EST ----- Regarding: Request for pre-operative cardiac clearance Request for pre-operative cardiac clearance:  1. What type of surgery is being performed?  CYSTOSCOPY/URETEROSCOPY/HOLMIUM LASER/STENT EXCHANGE  2. When is this surgery scheduled?  05/25/2023  3. Type of clearance being requested (medical, pharmacy, both)? MEDICAL   4. Are there any medications that need to be held prior to surgery? None - continuing apixaban per urology  5. Practice name and name of physician performing surgery?  Performing surgeon: Dr. Legrand Rams, MD Requesting clearance: Quentin Mulling, FNP-C    6. Anesthesia type (none, local, MAC, general)? GENERAL  7. What is the office phone and fax number?   Phone: [phone] Fax: [phone]  ATTENTION: Unable to create telephone message as per your standard workflow. Directed by [facility] providers to send requests for cardiac clearance to this pool for appropriate distribution to provider covering pre-operative clearances.   Quentin Mulling, MSN, APRN, FNP-C, CEN [facility]  Peri-operative Services Nurse Practitioner Phone: [phone] 05/18/23 8:51 AM

## 2023-05-18 NOTE — Telephone Encounter (Signed)
   Pre-operative Risk Assessment    Patient Name: Jodi Reeves  DOB: October 29, 1927  Date of last office visit: 05-15-2023 Date of next office visit: 10-2023   Request for Surgical Clearance    Procedure:   CYSTOSCOPY/URETEROSCOPY/HOLMIUM LASER/STENT EXCHANGE  Date of Surgery:  Clearance 05/25/23                               Surgeon: Dr. Legrand Rams, MD  Surgeon's Group or Practice Name:   [facility]  Phone: [phone]  Fax: [phone]   {4. What type of clearance is requested?  Medical or Cardiac Clearance only?  Pharmacy Clearance Only (Request is to hold medication only)?  Or Both?  Press F2 and select the clearance requested.  If both are needed, select both from the drop down list.       Type of Clearance Requested:   - Medical  AND ELIQUIS HOLD    Type of Anesthesia:  GENERAL   Signed, Oleta Mouse   05/18/2023, 9:02 AM

## 2023-05-21 NOTE — Telephone Encounter (Signed)
Was returning call. Please advise

## 2023-05-21 NOTE — Telephone Encounter (Signed)
Left message for Jodi Reeves nurse for the pt to call back to schedule a tele preop appt for the pt. Pt is supposed to have a procedure 05/25/23 with Dr. Richardo Hanks. Pt is going to need a televisit before she can be cleared.

## 2023-05-21 NOTE — Telephone Encounter (Signed)
[name] just saw this pt 05/15/23. She needs cystoscopy, ureteroscopy and stent exchange under gen anesthesia. Can you clear her from your last visit? It sounded like she was fairly stable.   I will also fwd to PharmD to get recommendations regarding holding anticoagulation.  Tereso Newcomer, PA-C    05/21/2023 5:58 PM

## 2023-05-22 ENCOUNTER — Ambulatory Visit: Payer: Medicare HMO | Admitting: Urology

## 2023-05-22 NOTE — Telephone Encounter (Signed)
Notes faxed to surgeon. This phone note will be removed from the preop pool. Tereso Newcomer, PA-C  05/22/2023 8:00 AM

## 2023-05-22 NOTE — Telephone Encounter (Signed)
Byrd Hesselbach, Production designer, theatre/television/film at [facility] facility called today about left message form our office about scheduling the pt a televisit for preop clearance. I explained to [facility], that I did d/w the preop APP [facility] yesterday, the pt was just recently seen by Carlos Levering, NP 05/15/23. Tereso Newcomer, PAC reached out Carlos Levering, NP for clearance, as well as he has reached out to our Pharm-D fro recommendations fro hold of Eliquis. I reviewed the notes per Tereso Newcomer, PAC. I assured Byrd Hesselbach that I will fax these notes to their facility as well. I was given fax# [phone].

## 2023-05-22 NOTE — Telephone Encounter (Signed)
Patient with diagnosis of afib on Eliquis for anticoagulation.    Procedure: CYSTOSCOPY/URETEROSCOPY/HOLMIUM LASER/STENT EXCHANGE  Date of procedure: 05/25/23   CHA2DS2-VASc Score = 9   This indicates a 12.2% annual risk of stroke. The patient's score is based upon: CHF History: 1 HTN History: 1 Diabetes History: 1 Stroke History: 2 Vascular Disease History: 1 Age Score: 2 Gender Score: 1      CrCl 61 ml/min Platelet count 135  Per office protocol, patient can hold Eliquis for 2 days prior to procedure.    **This guidance is not considered finalized until pre-operative APP has relayed final recommendations.**

## 2023-05-22 NOTE — Telephone Encounter (Signed)
   Patient Name: Jodi Reeves  DOB: November 28, 1927 MRN: [id number]  Primary Cardiologist: Julien Nordmann, MD  Chart reviewed by Carlos Levering, NP, who saw the patient recently in the office. Please see her comments below: Patient indicated back/shoulder pain that is chronic in nature. No concerning cardiac symptoms at the time of her visit. She was euvolemic and well compensated on exam. She is an acceptable risk for procedure without further cardiac testing.   Therefore, pt may proceed at acceptable risk.  Please call with questions.  Tereso Newcomer, PA-C 05/22/2023, 7:58 AM

## 2023-05-24 ENCOUNTER — Encounter: Payer: Self-pay | Admitting: Urology

## 2023-05-24 NOTE — Progress Notes (Signed)
Perioperative / Anesthesia Services  Pre-Admission Testing Clinical Review / Pre-Operative Anesthesia Consult  Date: 05/24/23  Patient Demographics:  Name: [name] DOB: [birth year] MRN:   [id number]  Planned Surgical Procedure(s):    Case: [id number] Date/Time: 05/25/23 1241   Procedure: CYSTOSCOPY/URETEROSCOPY/HOLMIUM LASER/STENT EXCHANGE (Right)   Anesthesia type: General   Pre-op diagnosis: Right Ureteral Stone   Location: [facility] OR ROOM 10 / [facility]   Surgeons: Sondra Come, MD      NOTE: Available PAT nursing documentation and vital signs have been reviewed. Clinical nursing staff has updated patient's Reeves/PSHx, current medication list, and drug allergies/intolerances to ensure comprehensive history available to assist in medical decision making as it pertains to the aforementioned surgical procedure and anticipated anesthetic course. Extensive review of available clinical information personally performed. Jodi Reeves and PSHx updated with any diagnoses/procedures that  may have been inadvertently omitted during his intake with the pre-admission testing department's nursing staff.  Clinical Discussion:  Jodi Reeves is a 89 y.o. female who is submitted for pre-surgical anesthesia review and clearance prior to her undergoing the above procedure. Patient has never been a smoker in the past.  Pertinent Reeves includes: CAD, HFpEF, persistent atrial fibrillation, TIA, cardiomegaly, aortic atherosclerosis, HTN, HLD, T2DM, hypothyroidism, pulmonary hypertension, GERD (uses CACO3 tablets PRN), esophageal web, lower extremity lymphedema, OA, lumbar DDD, scoliosis, nephrolithiasis, anxiety.  Patient is followed by cardiology Mariah Milling, MD). She was last seen in the cardiology clinic on 05/15/2023; notes reviewed. At the time of her clinic visit, patient complained of what sounded like chronic pain in her back, shoulder, and chest.  Patient with chronic lower  extremity edema due to OA underlying lymphedema diagnosis.  Cardiology provider noted the patient was a poor historian.  Patient denied any shortness of breath, PND, orthopnea, palpitations, weakness, fatigue, vertiginous symptoms, or presyncope/syncope. Patient with a past medical history significant for cardiovascular diagnoses. Documented physical exam was grossly benign, providing no evidence of acute exacerbation and/or decompensation of the patient's known cardiovascular conditions.  Patient with a history of TIA.  She was unable to provide any details surrounding this episode.  She has no deficits following neurological event.  Patient does have weakness in her extremities and frequently falls, however this is attributed to age-related debility and overall deconditioning.  Patient underwent diagnostic LEFT heart catheterization in 11/2003 revealing multivessel CAD; 10% LAD, 20% LCx, and 40% RCA.  Given the nonobstructive nature of her coronary artery disease, the decision was made to defer intervention opting for medical management.  Nuclear stress test was performed in December 2015 revealing a small region of mild ischemia in the apical region with associated wall motion abnormalities.  EF at that time was 60%.  Patient declined invasive evaluation (cardiac catheterization).  Patient with a known history of HFpEF and pulmonary hypertension.  Diagnoses have been followed via functional studies since time of diagnosis.  Most recent TTE was performed on 04/27/2023 revealing a low normal left trickle systolic function with an EF of 50-55%.  There were no regional wall motion abnormalities.  Right ventricular systolic function mildly decreased.  RVSP was moderately elevated at 47.4 mmHg.  Patient with moderate biatrial enlargement.  There was a small pericardial effusion with no evidence of tamponade.  There was mild mitral and aortic, and addition to moderate tricuspid, valve regurgitation.  Aortic  valve was sclerotic. All transvalvular gradients were noted to be normal providing no evidence suggestive of valvular stenosis. Aorta normal in size  with no evidence of ectasia or aneurysmal dilatation.  Patient with an atrial fibrillation diagnosis; CHA2DS2-VASc Score = 9 (age x 2, sex, HFpEF, HTN, TIA x 2, vascular disease, T2DM). Her rate and rhythm are currently being maintained on oral bisoprolol. She is chronically anticoagulated using dose reduced apixaban; reported to be compliant with therapy with no evidence or reports of GI/GU bleeding.  Blood pressure well controlled at 120/60 mmHg on currently prescribed beta-blocker (bisoprolol) and diuretic (furosemide) therapies. She is on omega-3 fatty acid capsule for her HLD diagnosis and further ASCVD prevention. T2DM loosely controlled on currently prescribed regimen; last HgbA1c was 8.0% when checked on 04/26/2023.  Again, functional capacity is decreased overall due to patient's age, multiple medical comorbidities, poor cognition, and generalized age-related debility.  She requires assistance with her ADLs/IADLs.  Given her current clinical status, patient not felt to be able to achieve 4 METS of physical activity without experiencing (at least to some degree) significant cardiovascular related symptoms.  No changes were made to her medication regimen.  Patient follow-up with outpatient cardiology in 6 months or sooner if needed.  [name] is scheduled for  CYSTOSCOPY/URETEROSCOPY/HOLMIUM LASER/STENT EXCHANGE (Right) on 05/25/2023 with Dr. Legrand Rams, MD.  Given patient's past medical history significant for cardiovascular diagnoses, presurgical cardiac clearance was sought by the PAT team. Per cardiology, "patient indicated back/shoulder pain that is chronic in nature.  No concerning cardiac symptoms at the time of her visit.  She is euvolemic and well compensated on exam. Patient would be at an ACCEPTABLE risk for the procedure without any  further cardiovascular testing".  Again, this patient is on daily oral anticoagulation therapy using a DOAC medication.  In communication with her primary attending surgeon, Dr. Legrand Rams, MD advised that due to the nature of her procedure, patient did not need to hold her prescribed oral anticoagulation therapy for this procedure.  Patient has been made aware of the instructions regarding her anticoagulant by the PAT staff.  Patient denies previous perioperative complications with anesthesia in the past. In review her EMR, it is noted that patient underwent a general anesthetic course here at [facility] ([facility]) in 04/2023 without documented complications.      05/15/2023    2:20 PM 05/07/2023    5:00 PM 05/07/2023    6:29 AM  Vitals with BMI  Height 5\' 3"     Weight 181 lbs 6 oz    BMI 32.14    Systolic 120 129 409  Diastolic 60 70 66  Pulse 69 74 73   Providers/Specialists:  NOTE: Primary physician provider listed below. Patient may have been seen by APP or partner within same practice.   PROVIDER ROLE / SPECIALTY LAST OV  Sondra Come, MD Urology (Surgeon) ???  Sherol Dade, DO Primary Care Provider ???  Julien Nordmann, MD Cardiology 05/15/2023   Allergies:   Allergies  Allergen Reactions   Amiodarone Other (See Comments)    Severe Thyroid issues    Fosamax [Alendronate]     dysphagia   Penicillins Anaphylaxis and Other (See Comments)    Tolerated 1st generation cephalosporin (CEFAZOLIN) 07/06/2020, 10/12/2020, 04/25/2023)   Sulfa Antibiotics Itching and Rash    Itching    Actos [Pioglitazone]     Not effective    Amaryl [Glimepiride]     Not effective     Atorvastatin Other (See Comments)    Muscle aches & All statins per Patient   Bentyl [Dicyclomine Hcl]  Could not tolerate nausea, reduced concentration, h/a    Ciprofloxacin Other (See Comments)    High blood pressure     Codeine Nausea And Vomiting    Ezetimibe-Simvastatin Other (See Comments)    Muscle aches, nausea, back pain    Lovastatin Other (See Comments)    Myalgias   Macrobid [Nitrofurantoin Macrocrystal]     Severe Itching, rash    Protonix [Pantoprazole Sodium]     Esophageal problems  Wants removed from list   Rosiglitazone Maleate Other (See Comments)    Edema   Statins     Muscle and joint aches to all statins   Victoza [Liraglutide]     Nausea    Alendronate Sodium Rash    Dysphagia and ulceration    Bactrim [Sulfamethoxazole-Trimethoprim] Rash    itching   Current Home Medications:   No current facility-administered medications for this encounter.    acetaminophen (TYLENOL) 500 MG tablet   alum & mag hydroxide-simeth (MAALOX/MYLANTA) 200-200-20 MG/5ML suspension   apixaban (ELIQUIS) 5 MG TABS tablet   Ascorbic Acid (VITAMIN C) 1000 MG tablet   bisoprolol (ZEBETA) 5 MG tablet   CALCIUM ANTACID EXTRA STRENGTH 750 MG chewable tablet   Cyanocobalamin (VITAMIN B-12) 5000 MCG LOZG   diclofenac Sodium (VOLTAREN) 1 % GEL   docusate sodium (COLACE) 100 MG capsule   estradiol (ESTRACE) 0.1 MG/GM vaginal cream   famotidine (PEPCID) 20 MG tablet   fluticasone (FLONASE) 50 MCG/ACT nasal spray   folic acid (FOLVITE) 1 MG tablet   furosemide (LASIX) 20 MG tablet   hydrOXYzine (ATARAX) 25 MG tablet   insulin aspart (NOVOLOG) 100 UNIT/ML injection   insulin glargine-yfgn (SEMGLEE, YFGN,) 100 UNIT/ML injection   levothyroxine (SYNTHROID, LEVOTHROID) 50 MCG tablet   Magnesium Oxide 250 MG TABS   melatonin 5 MG TABS   Multiple Vitamins-Iron (DAILY MULTIVITAMINS/IRON PO)   Multiple Vitamins-Minerals (HEALTHY EYES SUPERVISION 2) CAPS   nystatin powder   Olopatadine HCl 0.2 % SOLN   Omega-3 Fatty Acids (SUPER OMEGA 3 EPA/DHA) 1000 MG CAPS   ondansetron (ZOFRAN) 4 MG tablet   pyridOXINE (B-6) 50 MG tablet   simethicone (MYLICON) 80 MG chewable tablet   TRADJENTA 5 MG TABS tablet   glucose blood (ONE TOUCH ULTRA TEST)  test strip   magnesium hydroxide (MILK OF MAGNESIA) 400 MG/5ML suspension   Nystatin (GERHARDT'S BUTT CREAM) CREA   Skin Protectants, Misc. (DIMETHICONE-ZINC OXIDE) cream   History:   Past Medical History:  Diagnosis Date   (HFpEF) heart failure with preserved ejection fraction (HCC)    a.) TTE 04/27/2015: EF 65-70%, mild LVH, G2DD, mild RAE, deg MV with mild MAC, AoV sclerosis without stenosis, mild-mod TR, RVSP 30-35; b.) TTE 10/24/2018: EF 55-60%, no RWMAs, G1DD. mod LAE, RVSP 45.8, mild-mod TR; c.) TTE 04/27/2023: EF 50-55%, no RWMAs, mildly reduced RVSF, mod BAE, RVSP 47.4, mild AR/MR, mod TR   Allergy    Anxiety    Aortic atherosclerosis (HCC)    B12 deficiency    Balance disorder    Cardiomegaly    Colon polyps    Coronary artery disease, non-occlusive    a.) LHC 11/2003: 10% LAD, 20% LCx , 40% RCA - med mgmt; b.) nuc stress test 12/15: small region of mild ischemia in the apical region with WMA also noted in the apical region, EF 60%; declined invasive evaluation   DM2 (diabetes mellitus, type 2) (HCC)    Dysphagia    Esophageal obstruction    Esophageal web  Falls frequently    a.) associated with multiple associated injuries (left shoudler and left wrist fractures; left hand paresthesias)   GERD (gastroesophageal reflux disease)    Granuloma annulare 2010   History of chicken pox    History of eating disorder    HLD (hyperlipidemia)    HTN (hypertension)    Hypothyroidism    Incontinence of bowel    Insomnia    a.) takes melatonin PRN   Irritable bowel syndrome with diarrhea    Lymphedema of both lower extremities    Nephrolithiasis    On apixaban therapy    Osteopenia    Osteoporosis    Ovarian cancer (HCC) 1980   Persistent atrial fibrillation (HCC)    a.) CHA2DS2-VASc = 65 (age x2, sex, HFpEF, HTN, TIA x 2, vascular disease, T2DM) as of 05/24/2023; b.) cardiac rate/rhythm maintained on oral bisoprolol; chronically anticoagulated using apixaban   Poor memory     Pulmonary hypertension (HCC)    a.) TTE 04/27/2015:  RVSP 30-35 mmHg; b.) TTE 10/24/2018: RVSP 45.8 mmHg; c.) TTE 04/27/2023:  RVSP 47.4 mmHg   SBO (small bowel obstruction) (HCC) 1998   TIA (transient ischemic attack)    Venous insufficiency    Ventral hernia    Past Surgical History:  Procedure Laterality Date   2nd look laparotomy  1982   ABDOMINAL HYSTERECTOMY     for ovarian cancer s/p hysterectomy total 1976 and exp lap 1980   APPENDECTOMY     CARDIAC CATHETERIZATION  11/2003   neg; a fib found    CARPAL TUNNEL RELEASE Left 03/06/2018   Procedure: CARPAL TUNNEL RELEASE ENDOSCOPIC;  Surgeon: Christena Flake, MD;  Location: [facility];  Service: Orthopedics;  Laterality: Left;  diabetic - oral meds   CARPAL TUNNEL RELEASE Left    Procedure: CARPAL TUNNEL RELEASE; Location: [facility]; Surgeon: Leron Croak, MD   CATARACT EXTRACTION Right 08/2005   cataract OD  2002   CHOLECYSTECTOMY  1986   CYSTOSCOPY WITH RETROGRADE PYELOGRAM, URETEROSCOPY AND STENT PLACEMENT Right 04/25/2023   Procedure: CYSTOSCOPY WITH RETROGRADE PYELOGRAM, URETEROSCOPY AND STENT PLACEMENT;  Surgeon: Jannifer Hick, MD;  Location: [facility];  Service: Urology;  Laterality: Right;   DEXA  9/04 and 1/02   EGD/dilation/colon  04/2004   ESOPHAGOGASTRODUODENOSCOPY (EGD) WITH PROPOFOL N/A 05/16/2016   Procedure: ESOPHAGOGASTRODUODENOSCOPY (EGD) WITH PROPOFOL with dilation;  Surgeon: Midge Minium, MD;  Location: [facility];  Service: Endoscopy;  Laterality: N/A;   FEMORAL HERNIA REPAIR Right 1958   fracture L elbow/wrist  2000   INTRAMEDULLARY (IM) NAIL INTERTROCHANTERIC Left 10/12/2020   Procedure: INTRAMEDULLARY (IM) NAIL INTERTROCHANTRIC;  Surgeon: Juanell Fairly, MD;  Location: [facility];  Service: Orthopedics;  Laterality: Left;   intussception/obstruction  03/1997   kidney stone x2  1991   KYPHOPLASTY N/A 04/13/2020   Procedure: [id number];  Surgeon: Kennedy Bucker, MD;  Location: [facility] ORS;  Service:  Orthopedics;  Laterality: N/A;   KYPHOPLASTY N/A 05/18/2020   Procedure: L1 KYPHOPLASTY;  Surgeon: Kennedy Bucker, MD;  Location: [facility];  Service: Orthopedics;  Laterality: N/A;   KYPHOPLASTY N/A 07/06/2020   Procedure: L4  KYPHOPLASTY;  Surgeon: Kennedy Bucker, MD;  Location: [facility];  Service: Orthopedics;  Laterality: N/A;   laminectomy L4-5  1971   LUMBAR LAMINECTOMY     1970 ruptured disc    MOUTH SURGERY     myoview stress  11/2003   syncope (-)   REVERSE SHOULDER ARTHROPLASTY Left 10/06/2014   Procedure: REVERSE SHOULDER ARTHROPLASTY;  Surgeon: Christena Flake, MD;  Location: [facility] ORS;  Service: Orthopedics;  Laterality: Left;   stillbirth  1969   TOTAL ABDOMINAL HYSTERECTOMY W/ BILATERAL SALPINGOOPHORECTOMY  1980   ovarian cancer   TOTAL SHOULDER ARTHROPLASTY Left    WRIST FRACTURE SURGERY Right 02/2004   Family History  Problem Relation Age of Onset   Early death Father    Diabetes Mother    Heart disease Mother    Arthritis Brother    Cancer Brother    Depression Brother    Hearing loss Brother    Arthritis Brother    Cancer Brother        colon   Hearing loss Brother    Cancer Brother    Diabetes Brother    Hearing loss Brother    Heart disease Brother    Hyperlipidemia Brother    Heart disease Sister    Hypertension Sister    Stroke Sister    Hearing loss Daughter    Hypertension Daughter    Diabetes Paternal Grandfather    Hearing loss Sister    Heart disease Sister    Arthritis Sister    Depression Sister    Hearing loss Sister    Heart disease Sister    COPD Brother    Early death Brother    Early death Brother    Social History   Tobacco Use   Smoking status: Never   Smokeless tobacco: Never  Substance Use Topics   Alcohol use: No    Comment: may have occasional glass of wine   Pertinent Clinical Results:  LABS:  Lab Results  Component Value Date   WBC 5.0 05/05/2023   HGB 11.3 (L) 05/05/2023   HCT 33.2 (L) 05/05/2023   MCV 97.4  05/05/2023   PLT 135 (L) 05/05/2023   Lab Results  Component Value Date   NA 134 (L) 05/05/2023   CL 104 05/05/2023   K 4.7 05/05/2023   CO2 22 05/05/2023   BUN 19 05/05/2023   CREATININE 0.58 05/05/2023   GFRNONAA >60 05/05/2023   CALCIUM 7.6 (L) 05/05/2023   PHOS 2.6 05/02/2023   ALBUMIN 1.9 (L) 05/01/2023   GLUCOSE 124 (H) 05/05/2023    ECG: Date: 05/15/2023  Time ECG obtained: 1427 PM Rate: 69 bpm Rhythm: atrial fibrillation Axis (leads I and aVF): left Intervals: QRS 78 ms. QTc 428 ms. ST segment and T wave changes: No evidence of acute T wave abnormalities or significant ST segment elevation or depression. Evidence of an age undetermined inferior and anterolateral infarcts noted.  Comparison: Similar to previous tracing obtained on 05/01/2023   IMAGING / PROCEDURES: DIAGNOSTIC RADIOGRAPHS OF CHEST PORTABLE 1 VIEW performed on 04/28/2023 The heart is enlarged.  Vascular calcifications are seen in the aortic arch.  There is a small left pleural effusion with associated atelectasis/airspace disease.  There is mild right basilar atelectasis/airspace disease.  No pneumothorax.  Degenerative changes are seen in the spine.  TRANSTHORACIC ECHOCARDIOGRAM performed on 04/27/2023 Left ventricular ejection fraction, by estimation, is 50 to 55%. The left ventricle has low normal function. The left ventricle has no regional wall motion abnormalities. Left ventricular diastolic parameters are indeterminate.  Right ventricular systolic function is mildly reduced. The right ventricular size is normal. There is moderately elevated pulmonary artery systolic pressure. The estimated right ventricular systolic pressure is 47.4 mmHg.  Left atrial size was moderately dilated.  Right atrial size was moderately dilated.  A small pericardial effusion is present. There is no evidence  of cardiac tamponade.  The mitral valve is normal in structure. Mild mitral valve regurgitation. Tricuspid valve  regurgitation is moderate.  The aortic valve is tricuspid. Aortic valve regurgitation is mild.  Aortic valve sclerosis is present, with no evidence of aortic valve stenosis. The inferior vena cava is normal in size with <50% respiratory variability, suggesting right atrial pressure of 8 mmHg.  CT ABDOMEN PELVIS WO CONTRAST performed on 04/27/2023 Interval placement of right ureteral stent with mild caliectasis within the right kidney, slightly improved since prior study. Nonobstructing left nephrolithiasis. Small bilateral pleural effusions with dependent atelectasis in the lower lobes. Coronary artery disease  Aortic atherosclerosis. Small amount of free fluid in the pelvis.   MR LUMBAR SPINE WO CONTRAST performed on 03/29/2023 Lumbar spondylosis, scoliosis, and degenerative disc disease, causing moderate impingement at T11-12 and L3-4; and mild impingement at T12-L1, L2-3, L4-5, and L5-S1. Impingement is worsened at several levels compared to prior. Chronic vertebral augmentations at T12, L1, and L4 related to prior compression fractures. No acute compression fracture is identified. Levoconvex lumbar scoliosis with rotary component.  MYOCARDIAL PERFUSION IMAGING STUDY (LEXISCAN) performed on 04/06/2017 The left ventricular ejection fraction is hyperdynamic (>65%).  Horizontal ST segment depression ST segment depression of 1 mm was noted during stress in the II, III and aVF leads. The study is normal. This is a low risk study.  Impression and Plan:  Jodi Reeves has been referred for pre-anesthesia review and clearance prior to her undergoing the planned anesthetic and procedural courses. Available labs, pertinent testing, and imaging results were personally reviewed by me in preparation for upcoming operative/procedural course. [facility] medical record has been updated following extensive record review and patient interview with PAT staff.   This patient has been appropriately  cleared by cardiology with an overall ACCEPTABLE risk of experiencing significant perioperative cardiovascular complications. Based on clinical review performed today (05/24/23), barring any significant acute changes in the patient's overall condition, it is anticipated that she will be able to proceed with the planned surgical intervention. Any acute changes in clinical condition may necessitate her procedure being postponed and/or cancelled. Patient will meet with anesthesia team (MD and/or CRNA) on the day of her procedure for preoperative evaluation/assessment. Questions regarding anesthetic course will be fielded at that time.   Pre-surgical instructions were reviewed with the patient during his PAT appointment, and questions were fielded to satisfaction by PAT clinical staff. She has been instructed on which medications that she will need to hold prior to surgery, as well as the ones that have been deemed safe/appropriate to take on the day of his procedure. As part of the general education provided by PAT, patient made aware both verbally and in writing, that she would need to abstain from the use of any illegal substances during his perioperative course. She was advised that failure to follow the provided instructions could necessitate case cancellation or result in serious perioperative complications up to and including death. Patient encouraged to contact PAT and/or her surgeon's office to discuss any questions or concerns that may arise prior to surgery; verbalized understanding.   Quentin Mulling, MSN, APRN, FNP-C, CEN [facility]  Perioperative Services Nurse Practitioner Phone: [phone] Fax: [phone] 05/24/23 11:14 AM  NOTE: This note has been prepared using Dragon dictation software. Despite my best ability to proofread, there is always the potential that unintentional transcriptional errors may still occur from this process.

## 2023-05-25 ENCOUNTER — Encounter: Payer: Self-pay | Admitting: Urology

## 2023-05-25 ENCOUNTER — Inpatient Hospital Stay: Payer: Medicare HMO

## 2023-05-25 ENCOUNTER — Encounter: Admission: AD | Disposition: E | Payer: Self-pay | Source: Home / Self Care | Attending: Pulmonary Disease

## 2023-05-25 ENCOUNTER — Ambulatory Visit: Payer: Medicare HMO

## 2023-05-25 ENCOUNTER — Ambulatory Visit: Payer: Medicare HMO | Admitting: Urgent Care

## 2023-05-25 ENCOUNTER — Inpatient Hospital Stay
Admission: AD | Admit: 2023-05-25 | Discharge: 2023-06-23 | DRG: 853 | Disposition: E | Payer: Medicare HMO | Attending: Student in an Organized Health Care Education/Training Program | Admitting: Student in an Organized Health Care Education/Training Program

## 2023-05-25 ENCOUNTER — Other Ambulatory Visit: Payer: Self-pay

## 2023-05-25 DIAGNOSIS — G9340 Encephalopathy, unspecified: Secondary | ICD-10-CM | POA: Diagnosis not present

## 2023-05-25 DIAGNOSIS — J9601 Acute respiratory failure with hypoxia: Secondary | ICD-10-CM | POA: Diagnosis present

## 2023-05-25 DIAGNOSIS — Z83438 Family history of other disorder of lipoprotein metabolism and other lipidemia: Secondary | ICD-10-CM

## 2023-05-25 DIAGNOSIS — Z880 Allergy status to penicillin: Secondary | ICD-10-CM

## 2023-05-25 DIAGNOSIS — E871 Hypo-osmolality and hyponatremia: Secondary | ICD-10-CM | POA: Diagnosis present

## 2023-05-25 DIAGNOSIS — I2489 Other forms of acute ischemic heart disease: Secondary | ICD-10-CM | POA: Diagnosis present

## 2023-05-25 DIAGNOSIS — I2510 Atherosclerotic heart disease of native coronary artery without angina pectoris: Secondary | ICD-10-CM | POA: Diagnosis present

## 2023-05-25 DIAGNOSIS — I5032 Chronic diastolic (congestive) heart failure: Secondary | ICD-10-CM | POA: Diagnosis present

## 2023-05-25 DIAGNOSIS — D649 Anemia, unspecified: Secondary | ICD-10-CM | POA: Diagnosis not present

## 2023-05-25 DIAGNOSIS — Z8543 Personal history of malignant neoplasm of ovary: Secondary | ICD-10-CM

## 2023-05-25 DIAGNOSIS — I110 Hypertensive heart disease with heart failure: Secondary | ICD-10-CM | POA: Diagnosis present

## 2023-05-25 DIAGNOSIS — A4159 Other Gram-negative sepsis: Principal | ICD-10-CM | POA: Diagnosis present

## 2023-05-25 DIAGNOSIS — Z79899 Other long term (current) drug therapy: Secondary | ICD-10-CM

## 2023-05-25 DIAGNOSIS — R6521 Severe sepsis with septic shock: Secondary | ICD-10-CM | POA: Diagnosis present

## 2023-05-25 DIAGNOSIS — I272 Pulmonary hypertension, unspecified: Secondary | ICD-10-CM | POA: Diagnosis present

## 2023-05-25 DIAGNOSIS — Z860100 Personal history of colon polyps, unspecified: Secondary | ICD-10-CM

## 2023-05-25 DIAGNOSIS — Z823 Family history of stroke: Secondary | ICD-10-CM

## 2023-05-25 DIAGNOSIS — E538 Deficiency of other specified B group vitamins: Secondary | ICD-10-CM | POA: Diagnosis present

## 2023-05-25 DIAGNOSIS — Z8619 Personal history of other infectious and parasitic diseases: Secondary | ICD-10-CM

## 2023-05-25 DIAGNOSIS — N136 Pyonephrosis: Secondary | ICD-10-CM | POA: Diagnosis present

## 2023-05-25 DIAGNOSIS — I4821 Permanent atrial fibrillation: Secondary | ICD-10-CM | POA: Diagnosis present

## 2023-05-25 DIAGNOSIS — N170 Acute kidney failure with tubular necrosis: Secondary | ICD-10-CM | POA: Diagnosis present

## 2023-05-25 DIAGNOSIS — D6959 Other secondary thrombocytopenia: Secondary | ICD-10-CM | POA: Diagnosis present

## 2023-05-25 DIAGNOSIS — Z96612 Presence of left artificial shoulder joint: Secondary | ICD-10-CM | POA: Diagnosis present

## 2023-05-25 DIAGNOSIS — K7200 Acute and subacute hepatic failure without coma: Secondary | ICD-10-CM | POA: Diagnosis present

## 2023-05-25 DIAGNOSIS — Z825 Family history of asthma and other chronic lower respiratory diseases: Secondary | ICD-10-CM

## 2023-05-25 DIAGNOSIS — Z833 Family history of diabetes mellitus: Secondary | ICD-10-CM

## 2023-05-25 DIAGNOSIS — E039 Hypothyroidism, unspecified: Secondary | ICD-10-CM | POA: Diagnosis present

## 2023-05-25 DIAGNOSIS — B964 Proteus (mirabilis) (morganii) as the cause of diseases classified elsewhere: Secondary | ICD-10-CM | POA: Diagnosis present

## 2023-05-25 DIAGNOSIS — Z79890 Hormone replacement therapy (postmenopausal): Secondary | ICD-10-CM

## 2023-05-25 DIAGNOSIS — E785 Hyperlipidemia, unspecified: Secondary | ICD-10-CM | POA: Diagnosis present

## 2023-05-25 DIAGNOSIS — Z90710 Acquired absence of both cervix and uterus: Secondary | ICD-10-CM

## 2023-05-25 DIAGNOSIS — E669 Obesity, unspecified: Secondary | ICD-10-CM | POA: Diagnosis present

## 2023-05-25 DIAGNOSIS — Z66 Do not resuscitate: Secondary | ICD-10-CM | POA: Diagnosis present

## 2023-05-25 DIAGNOSIS — I890 Lymphedema, not elsewhere classified: Secondary | ICD-10-CM | POA: Diagnosis present

## 2023-05-25 DIAGNOSIS — Z90722 Acquired absence of ovaries, bilateral: Secondary | ICD-10-CM

## 2023-05-25 DIAGNOSIS — Z7984 Long term (current) use of oral hypoglycemic drugs: Secondary | ICD-10-CM

## 2023-05-25 DIAGNOSIS — Z7189 Other specified counseling: Secondary | ICD-10-CM

## 2023-05-25 DIAGNOSIS — G9341 Metabolic encephalopathy: Secondary | ICD-10-CM | POA: Diagnosis present

## 2023-05-25 DIAGNOSIS — N139 Obstructive and reflux uropathy, unspecified: Secondary | ICD-10-CM | POA: Diagnosis present

## 2023-05-25 DIAGNOSIS — E1142 Type 2 diabetes mellitus with diabetic polyneuropathy: Secondary | ICD-10-CM | POA: Diagnosis present

## 2023-05-25 DIAGNOSIS — Z8659 Personal history of other mental and behavioral disorders: Secondary | ICD-10-CM

## 2023-05-25 DIAGNOSIS — I700 Atherosclerosis of aorta: Secondary | ICD-10-CM | POA: Diagnosis present

## 2023-05-25 DIAGNOSIS — Z9049 Acquired absence of other specified parts of digestive tract: Secondary | ICD-10-CM

## 2023-05-25 DIAGNOSIS — Z881 Allergy status to other antibiotic agents status: Secondary | ICD-10-CM

## 2023-05-25 DIAGNOSIS — M810 Age-related osteoporosis without current pathological fracture: Secondary | ICD-10-CM | POA: Diagnosis present

## 2023-05-25 DIAGNOSIS — R443 Hallucinations, unspecified: Secondary | ICD-10-CM | POA: Diagnosis not present

## 2023-05-25 DIAGNOSIS — A419 Sepsis, unspecified organism: Principal | ICD-10-CM | POA: Diagnosis present

## 2023-05-25 DIAGNOSIS — Z515 Encounter for palliative care: Secondary | ICD-10-CM | POA: Diagnosis not present

## 2023-05-25 DIAGNOSIS — Z818 Family history of other mental and behavioral disorders: Secondary | ICD-10-CM

## 2023-05-25 DIAGNOSIS — I4891 Unspecified atrial fibrillation: Secondary | ICD-10-CM | POA: Diagnosis not present

## 2023-05-25 DIAGNOSIS — Z993 Dependence on wheelchair: Secondary | ICD-10-CM

## 2023-05-25 DIAGNOSIS — K219 Gastro-esophageal reflux disease without esophagitis: Secondary | ICD-10-CM | POA: Diagnosis present

## 2023-05-25 DIAGNOSIS — N201 Calculus of ureter: Secondary | ICD-10-CM | POA: Diagnosis present

## 2023-05-25 DIAGNOSIS — Z882 Allergy status to sulfonamides status: Secondary | ICD-10-CM

## 2023-05-25 DIAGNOSIS — Z888 Allergy status to other drugs, medicaments and biological substances status: Secondary | ICD-10-CM

## 2023-05-25 DIAGNOSIS — Z8249 Family history of ischemic heart disease and other diseases of the circulatory system: Secondary | ICD-10-CM

## 2023-05-25 DIAGNOSIS — E876 Hypokalemia: Secondary | ICD-10-CM | POA: Diagnosis present

## 2023-05-25 DIAGNOSIS — Z87442 Personal history of urinary calculi: Secondary | ICD-10-CM

## 2023-05-25 DIAGNOSIS — Z8261 Family history of arthritis: Secondary | ICD-10-CM

## 2023-05-25 DIAGNOSIS — Z6832 Body mass index (BMI) 32.0-32.9, adult: Secondary | ICD-10-CM

## 2023-05-25 DIAGNOSIS — N179 Acute kidney failure, unspecified: Secondary | ICD-10-CM | POA: Diagnosis present

## 2023-05-25 DIAGNOSIS — Z87440 Personal history of urinary (tract) infections: Secondary | ICD-10-CM

## 2023-05-25 DIAGNOSIS — E872 Acidosis, unspecified: Secondary | ICD-10-CM | POA: Diagnosis present

## 2023-05-25 DIAGNOSIS — Z794 Long term (current) use of insulin: Secondary | ICD-10-CM

## 2023-05-25 DIAGNOSIS — Z8673 Personal history of transient ischemic attack (TIA), and cerebral infarction without residual deficits: Secondary | ICD-10-CM

## 2023-05-25 DIAGNOSIS — Z7901 Long term (current) use of anticoagulants: Secondary | ICD-10-CM

## 2023-05-25 DIAGNOSIS — R579 Shock, unspecified: Secondary | ICD-10-CM | POA: Diagnosis present

## 2023-05-25 DIAGNOSIS — B952 Enterococcus as the cause of diseases classified elsewhere: Secondary | ICD-10-CM | POA: Diagnosis present

## 2023-05-25 DIAGNOSIS — R54 Age-related physical debility: Secondary | ICD-10-CM | POA: Diagnosis present

## 2023-05-25 DIAGNOSIS — I872 Venous insufficiency (chronic) (peripheral): Secondary | ICD-10-CM | POA: Diagnosis present

## 2023-05-25 HISTORY — DX: Lymphedema, not elsewhere classified: I89.0

## 2023-05-25 HISTORY — DX: Esophageal web: Q39.4

## 2023-05-25 HISTORY — DX: Irritable bowel syndrome with diarrhea: K58.0

## 2023-05-25 HISTORY — PX: CYSTOSCOPY/URETEROSCOPY/HOLMIUM LASER/STENT PLACEMENT: SHX6546

## 2023-05-25 HISTORY — DX: Insomnia, unspecified: G47.00

## 2023-05-25 HISTORY — DX: Cardiomegaly: I51.7

## 2023-05-25 HISTORY — DX: Unspecified diastolic (congestive) heart failure: I50.30

## 2023-05-25 HISTORY — DX: Other intervertebral disc degeneration, lumbar region without mention of lumbar back pain or lower extremity pain: M51.369

## 2023-05-25 HISTORY — DX: Scoliosis, unspecified: M41.9

## 2023-05-25 HISTORY — DX: Calculus of kidney: N20.0

## 2023-05-25 HISTORY — DX: Other amnesia: R41.3

## 2023-05-25 HISTORY — DX: Long term (current) use of anticoagulants: Z79.01

## 2023-05-25 HISTORY — DX: Pulmonary hypertension, unspecified: I27.20

## 2023-05-25 HISTORY — DX: Atherosclerosis of aorta: I70.0

## 2023-05-25 LAB — LACTIC ACID, PLASMA
Lactic Acid, Venous: 5.9 mmol/L (ref 0.5–1.9)
Lactic Acid, Venous: 7 mmol/L (ref 0.5–1.9)
Lactic Acid, Venous: 5.6 mmol/L (ref 0.5–1.9)

## 2023-05-25 LAB — URINALYSIS, ROUTINE W REFLEX MICROSCOPIC
Bacteria, UA: NONE SEEN
Bilirubin Urine: NEGATIVE
Glucose, UA: NEGATIVE mg/dL
Ketones, ur: 20 mg/dL — AB
Leukocytes,Ua: NEGATIVE
Nitrite: NEGATIVE
Protein, ur: 100 mg/dL — AB
RBC / HPF: >50 RBC/hpf — ABNORMAL HIGH (ref 0–5)
Specific Gravity, Urine: 1.026 (ref 1.005–1.030)
Squamous Epithelial / HPF: NONE SEEN /[HPF] (ref 0–5)
WBC, UA: >50 WBC/hpf — ABNORMAL HIGH (ref 0–5)
pH: 7 (ref 5.0–8.0)

## 2023-05-25 LAB — COMPREHENSIVE METABOLIC PANEL
ALT: 48 U/L — ABNORMAL HIGH (ref 0–44)
AST: 355 U/L — ABNORMAL HIGH (ref 15–41)
Albumin: 2.2 g/dL — ABNORMAL LOW (ref 3.5–5.0)
Alkaline Phosphatase: 313 U/L — ABNORMAL HIGH (ref 38–126)
Anion gap: 15 (ref 5–15)
BUN: 20 mg/dL (ref 8–23)
CO2: 21 mmol/L — ABNORMAL LOW (ref 22–32)
Calcium: 7.8 mg/dL — ABNORMAL LOW (ref 8.9–10.3)
Chloride: 101 mmol/L (ref 98–111)
Creatinine, Ser: 1.17 mg/dL — ABNORMAL HIGH (ref 0.44–1.00)
GFR, Estimated: 44 mL/min — ABNORMAL LOW (ref >60)
Glucose, Bld: 118 mg/dL — ABNORMAL HIGH (ref 70–99)
Potassium: 3.4 mmol/L — ABNORMAL LOW (ref 3.5–5.1)
Sodium: 137 mmol/L (ref 135–145)
Total Bilirubin: 2.3 mg/dL — ABNORMAL HIGH (ref 0.0–1.2)
Total Protein: 6.2 g/dL — ABNORMAL LOW (ref 6.5–8.1)

## 2023-05-25 LAB — CBC WITH DIFFERENTIAL/PLATELET
Abs Immature Granulocytes: 0.45 10*3/uL — ABNORMAL HIGH (ref 0.00–0.07)
Basophils Absolute: 0 10*3/uL (ref 0.0–0.1)
Basophils Relative: 0 %
Eosinophils Absolute: 0 10*3/uL (ref 0.0–0.5)
Eosinophils Relative: 0 %
HCT: 32.3 % — ABNORMAL LOW (ref 36.0–46.0)
Hemoglobin: 10.9 g/dL — ABNORMAL LOW (ref 12.0–15.0)
Immature Granulocytes: 5 %
Lymphocytes Relative: 2 %
Lymphs Abs: 0.2 10*3/uL — ABNORMAL LOW (ref 0.7–4.0)
MCH: 32.9 pg (ref 26.0–34.0)
MCHC: 33.7 g/dL (ref 30.0–36.0)
MCV: 97.6 fL (ref 80.0–100.0)
Monocytes Absolute: 0.1 10*3/uL (ref 0.1–1.0)
Monocytes Relative: 1 %
Neutro Abs: 7.8 10*3/uL — ABNORMAL HIGH (ref 1.7–7.7)
Neutrophils Relative %: 92 %
Platelets: 134 10*3/uL — ABNORMAL LOW (ref 150–400)
RBC: 3.31 MIL/uL — ABNORMAL LOW (ref 3.87–5.11)
RDW: 13.7 % (ref 11.5–15.5)
Smear Review: NORMAL
WBC: 8.4 10*3/uL (ref 4.0–10.5)
nRBC: 0.4 % — ABNORMAL HIGH (ref 0.0–0.2)

## 2023-05-25 LAB — TROPONIN I (HIGH SENSITIVITY)
Troponin I (High Sensitivity): 41 ng/L — ABNORMAL HIGH (ref <18)
Troponin I (High Sensitivity): 43 ng/L — ABNORMAL HIGH (ref <18)

## 2023-05-25 LAB — MRSA NEXT GEN BY PCR, NASAL: MRSA by PCR Next Gen: NOT DETECTED

## 2023-05-25 LAB — GLUCOSE, CAPILLARY
Glucose-Capillary: 129 mg/dL — ABNORMAL HIGH (ref 70–99)
Glucose-Capillary: 132 mg/dL — ABNORMAL HIGH (ref 70–99)
Glucose-Capillary: 139 mg/dL — ABNORMAL HIGH (ref 70–99)
Glucose-Capillary: 88 mg/dL (ref 70–99)
Glucose-Capillary: 91 mg/dL (ref 70–99)

## 2023-05-25 LAB — BRAIN NATRIURETIC PEPTIDE: B Natriuretic Peptide: 256.8 pg/mL — ABNORMAL HIGH (ref 0.0–100.0)

## 2023-05-25 SURGERY — CYSTOSCOPY/URETEROSCOPY/HOLMIUM LASER/STENT PLACEMENT
Anesthesia: General | Laterality: Right

## 2023-05-25 MED ORDER — SODIUM CHLORIDE 0.9 % IV SOLN: INTRAVENOUS | Status: DC

## 2023-05-25 MED ORDER — DEXAMETHASONE SODIUM PHOSPHATE 10 MG/ML IJ SOLN
INTRAMUSCULAR | Status: DC | PRN
  Administered 2023-05-25: 5 mg via INTRAVENOUS

## 2023-05-25 MED ORDER — LACTATED RINGERS IV BOLUS
1000.0000 mL | Freq: Once | INTRAVENOUS | Status: AC
  Administered 2023-05-25: 1000 mL via INTRAVENOUS

## 2023-05-25 MED ORDER — VASOPRESSIN 20 UNITS/100 ML INFUSION FOR SHOCK
0.0000 [IU]/min | INTRAVENOUS | Status: DC
  Administered 2023-05-25 – 2023-05-27 (×3): 0.03 [IU]/min via INTRAVENOUS
  Administered 2023-05-28 (×2): 0.02 [IU]/min via INTRAVENOUS
  Filled 2023-05-25 (×8): qty 100

## 2023-05-25 MED ORDER — LACTATED RINGERS IV BOLUS
500.0000 mL | Freq: Once | INTRAVENOUS | Status: AC
  Administered 2023-05-25: 500 mL via INTRAVENOUS

## 2023-05-25 MED ORDER — MEROPENEM 1 G IV SOLR
1.0000 g | Freq: Two times a day (BID) | INTRAVENOUS | Status: DC
  Administered 2023-05-25 – 2023-05-26 (×2): 1 g via INTRAVENOUS
  Filled 2023-05-25 (×2): qty 20

## 2023-05-25 MED ORDER — NITROFURANTOIN MACROCRYSTAL 100 MG PO CAPS: 100.0000 mg | ORAL_CAPSULE | Freq: Every day | ORAL | 0 refills | Status: DC

## 2023-05-25 MED ORDER — CIPROFLOXACIN IN D5W 400 MG/200ML IV SOLN
400.0000 mg | INTRAVENOUS | Status: AC
  Administered 2023-05-25: 400 mg via INTRAVENOUS

## 2023-05-25 MED ORDER — FENTANYL CITRATE (PF) 100 MCG/2ML IJ SOLN
INTRAMUSCULAR | Status: AC
  Filled 2023-05-25: qty 2

## 2023-05-25 MED ORDER — NOREPINEPHRINE 4 MG/250ML-% IV SOLN
2.0000 ug/min | INTRAVENOUS | Status: DC
  Administered 2023-05-25: 4 ug/min via INTRAVENOUS
  Filled 2023-05-25 (×2): qty 250

## 2023-05-25 MED ORDER — SODIUM CHLORIDE 0.9% FLUSH
3.0000 mL | Freq: Two times a day (BID) | INTRAVENOUS | Status: DC
  Administered 2023-05-25 – 2023-05-29 (×8): 3 mL via INTRAVENOUS

## 2023-05-25 MED ORDER — POLYETHYLENE GLYCOL 3350 17 G PO PACK
17.0000 g | PACK | Freq: Every day | ORAL | Status: DC | PRN
Start: 2023-05-25 — End: 2023-05-29

## 2023-05-25 MED ORDER — ACETAMINOPHEN 10 MG/ML IV SOLN
1000.0000 mg | Freq: Once | INTRAVENOUS | Status: AC
  Administered 2023-05-25: 1000 mg via INTRAVENOUS

## 2023-05-25 MED ORDER — ONDANSETRON HCL 4 MG/2ML IJ SOLN
INTRAMUSCULAR | Status: DC | PRN
  Administered 2023-05-25: 4 mg via INTRAVENOUS

## 2023-05-25 MED ORDER — CHLORHEXIDINE GLUCONATE 0.12 % MT SOLN
OROMUCOSAL | Status: AC
  Filled 2023-05-25: qty 15

## 2023-05-25 MED ORDER — ACETAMINOPHEN 10 MG/ML IV SOLN
INTRAVENOUS | Status: AC
  Filled 2023-05-25: qty 100

## 2023-05-25 MED ORDER — OXYCODONE HCL 5 MG/5ML PO SOLN: 5.0000 mg | Freq: Once | ORAL | Status: DC | PRN

## 2023-05-25 MED ORDER — CIPROFLOXACIN IN D5W 400 MG/200ML IV SOLN
INTRAVENOUS | Status: AC
  Filled 2023-05-25: qty 200

## 2023-05-25 MED ORDER — LIDOCAINE HCL (CARDIAC) PF 100 MG/5ML IV SOSY
PREFILLED_SYRINGE | INTRAVENOUS | Status: DC | PRN
  Administered 2023-05-25: 80 mg via INTRATRACHEAL

## 2023-05-25 MED ORDER — NOREPINEPHRINE 4 MG/250ML-% IV SOLN
0.0000 ug/min | INTRAVENOUS | Status: DC
  Administered 2023-05-25: 18 ug/min via INTRAVENOUS

## 2023-05-25 MED ORDER — ONDANSETRON HCL 4 MG PO TABS: 4.0000 mg | ORAL_TABLET | Freq: Four times a day (QID) | ORAL | Status: DC | PRN

## 2023-05-25 MED ORDER — LACTATED RINGERS IV SOLN: INTRAVENOUS | Status: DC

## 2023-05-25 MED ORDER — OXYCODONE HCL 5 MG PO TABS: 5.0000 mg | ORAL_TABLET | Freq: Once | ORAL | Status: DC | PRN

## 2023-05-25 MED ORDER — FENTANYL CITRATE (PF) 250 MCG/5ML IJ SOLN
INTRAMUSCULAR | Status: DC | PRN
  Administered 2023-05-25 (×4): 25 ug via INTRAVENOUS

## 2023-05-25 MED ORDER — SUGAMMADEX SODIUM 200 MG/2ML IV SOLN
INTRAVENOUS | Status: DC | PRN
  Administered 2023-05-25: 200 mg via INTRAVENOUS

## 2023-05-25 MED ORDER — HYDROCORTISONE SOD SUC (PF) 100 MG IJ SOLR
100.0000 mg | Freq: Once | INTRAMUSCULAR | Status: AC
  Administered 2023-05-25: 100 mg via INTRAVENOUS
  Filled 2023-05-25: qty 2

## 2023-05-25 MED ORDER — ACETAMINOPHEN 650 MG RE SUPP: 650.0000 mg | Freq: Four times a day (QID) | RECTAL | Status: DC | PRN

## 2023-05-25 MED ORDER — SODIUM CHLORIDE 0.9 % IV SOLN: 250.0000 mL | INTRAVENOUS | Status: AC

## 2023-05-25 MED ORDER — ORAL CARE MOUTH RINSE: 15.0000 mL | Freq: Once | OROMUCOSAL | Status: AC

## 2023-05-25 MED ORDER — POTASSIUM CHLORIDE CRYS ER 20 MEQ PO TBCR: 20.0000 meq | EXTENDED_RELEASE_TABLET | Freq: Once | ORAL | Status: DC

## 2023-05-25 MED ORDER — SODIUM CHLORIDE 0.9 % IR SOLN
Status: DC | PRN
  Administered 2023-05-25: 1

## 2023-05-25 MED ORDER — PROPOFOL 10 MG/ML IV BOLUS
INTRAVENOUS | Status: DC | PRN
  Administered 2023-05-25: 100 mg via INTRAVENOUS

## 2023-05-25 MED ORDER — ACETAMINOPHEN 325 MG PO TABS: 650.0000 mg | ORAL_TABLET | Freq: Four times a day (QID) | ORAL | Status: DC | PRN

## 2023-05-25 MED ORDER — ROCURONIUM BROMIDE 10 MG/ML (PF) SYRINGE
PREFILLED_SYRINGE | INTRAVENOUS | Status: DC | PRN
  Administered 2023-05-25: 40 mg via INTRAVENOUS

## 2023-05-25 MED ORDER — FENTANYL CITRATE (PF) 100 MCG/2ML IJ SOLN: 25.0000 ug | INTRAMUSCULAR | Status: DC | PRN

## 2023-05-25 MED ORDER — ONDANSETRON HCL 4 MG/2ML IJ SOLN
4.0000 mg | Freq: Four times a day (QID) | INTRAMUSCULAR | Status: DC | PRN
  Administered 2023-05-26: 4 mg via INTRAVENOUS
  Filled 2023-05-25: qty 2

## 2023-05-25 MED ORDER — PHENYLEPHRINE 80 MCG/ML (10ML) SYRINGE FOR IV PUSH (FOR BLOOD PRESSURE SUPPORT)
PREFILLED_SYRINGE | INTRAVENOUS | Status: DC | PRN
  Administered 2023-05-25: 80 ug via INTRAVENOUS

## 2023-05-25 MED ORDER — IOHEXOL 180 MG/ML  SOLN
INTRAMUSCULAR | Status: DC | PRN
  Administered 2023-05-25: 10 mL

## 2023-05-25 MED ORDER — INSULIN ASPART 100 UNIT/ML IJ SOLN
0.0000 [IU] | Freq: Three times a day (TID) | INTRAMUSCULAR | Status: DC
  Administered 2023-05-26 (×3): 1 [IU] via SUBCUTANEOUS
  Filled 2023-05-25: qty 1

## 2023-05-25 MED ORDER — CHLORHEXIDINE GLUCONATE CLOTH 2 % EX PADS
6.0000 | MEDICATED_PAD | Freq: Every day | CUTANEOUS | Status: DC
  Administered 2023-05-25 – 2023-05-28 (×4): 6 via TOPICAL

## 2023-05-25 MED ORDER — CHLORHEXIDINE GLUCONATE 0.12 % MT SOLN
15.0000 mL | Freq: Once | OROMUCOSAL | Status: AC
Start: 2023-05-25 — End: 2023-05-25
  Administered 2023-05-25: 15 mL via OROMUCOSAL

## 2023-05-25 SURGICAL SUPPLY — 21 items
BAG DRAIN SIEMENS DORNER NS (MISCELLANEOUS) ×1 IMPLANT
BAG PRESSURE INF REUSE 3000 (BAG) ×1 IMPLANT
BASKET ZERO TIP 1.9FR (BASKET) IMPLANT
BRUSH SCRUB EZ 1% IODOPHOR (MISCELLANEOUS) ×1 IMPLANT
CATH URETL OPEN 5X70 (CATHETERS) IMPLANT
CNTNR URN SCR LID CUP LEK RST (MISCELLANEOUS) IMPLANT
DRAPE UTILITY 15X26 TOWEL STRL (DRAPES) ×1 IMPLANT
DRSG TEGADERM 2-3/8X2-3/4 SM (GAUZE/BANDAGES/DRESSINGS) IMPLANT
GLOVE BIOGEL PI IND STRL 7.5 (GLOVE) ×1 IMPLANT
GOWN STRL REUS W/ TWL LRG LVL3 (GOWN DISPOSABLE) ×1 IMPLANT
GOWN STRL REUS W/ TWL XL LVL3 (GOWN DISPOSABLE) ×1 IMPLANT
GUIDEWIRE STR DUAL SENSOR (WIRE) ×1 IMPLANT
IV NS IRRIG 3000ML ARTHROMATIC (IV SOLUTION) ×1 IMPLANT
KIT TURNOVER CYSTO (KITS) ×1 IMPLANT
PACK CYSTO AR (MISCELLANEOUS) ×1 IMPLANT
SET CYSTO W/LG BORE CLAMP LF (SET/KITS/TRAYS/PACK) ×1 IMPLANT
STENT URET 6FRX24 CONTOUR (STENTS) IMPLANT
SURGILUBE 2OZ TUBE FLIPTOP (MISCELLANEOUS) ×1 IMPLANT
SYR 10ML LL (SYRINGE) ×1 IMPLANT
VALVE UROSEAL ADJ ENDO (VALVE) IMPLANT
WATER STERILE IRR 500ML POUR (IV SOLUTION) ×1 IMPLANT

## 2023-05-25 NOTE — Progress Notes (Signed)
VS- b/p 94/49 P-120 O2 94% on RA. Temp 101.1 temporal. Awaiting hospitalist consult and in the meantime, notified Dr Richardo Hanks. Orders to give Ofirmev IV and awaiting antibiotic orders.

## 2023-05-25 NOTE — Assessment & Plan Note (Addendum)
Minimally increased troponin at 41 with no ischemic changes on EKG. Recent echocardiogram obtained earlier this month with no regional wall motion abnormalities.  - Repeat troponin pending

## 2023-05-25 NOTE — Assessment & Plan Note (Signed)
Likely secondary to sepsis and acute encephalopathy.  - Continue supplemental oxygen to maintain oxygen saturation above 88% - Wean as tolerated

## 2023-05-25 NOTE — Assessment & Plan Note (Signed)
Discussed patient's goals of care with her son at bedside.  He notes that she was adamant she would not want resuscitation in the event of cardiac or pulmonary arrest, however he would like for [place] to pursue IV medications as able including pressors, antibiotics and fluids.    Given patient's rapid deterioration, we had a frank discussion that patient's prognosis is very guarded even with aggressive management.

## 2023-05-25 NOTE — Anesthesia Preprocedure Evaluation (Signed)
Anesthesia Evaluation  Patient identified by MRN, date of birth, ID band Patient confused  General Assessment Comment: Last took xarelto Saturday 9pm (less than 72hrs)  Reviewed: Allergy & Precautions, NPO status , Patient's Chart, lab work & pertinent test results  History of Anesthesia Complications Negative for: history of anesthetic complications  Airway Mallampati: III  TM Distance: >3 FB Neck ROM: Full    Dental  (+) Teeth Intact, Poor Dentition, Dental Advidsory Given   Pulmonary neg pulmonary ROS, neg sleep apnea, neg COPD, Patient abstained from smoking.Not current smoker   Pulmonary exam normal breath sounds clear to auscultation       Cardiovascular Exercise Tolerance: Poor METShypertension, (-) angina + CAD, + Peripheral Vascular Disease and +CHF  (-) Past MI and (-) Cardiac Stents + dysrhythmias Atrial Fibrillation + Valvular Problems/Murmurs  Rhythm:Irregular Rate:Normal - Systolic murmurs 1. The left ventricle has normal systolic function, with an ejection  fraction of 55-60%. The cavity size was normal. Left ventricular diastolic  Doppler parameters are consistent with impaired relaxation.  2. The right ventricle has normal systolic function. The cavity was  normal. There is no increase in right ventricular wall thickness. Right  ventricular systolic pressure is moderately elevated with an estimated  pressure of 45.8 mmHg.  3. Tricuspid valve regurgitation is mild-moderate.  4. Left atrial size was moderately dilated.  5. Rhythm is atrial fibrillation    Neuro/Psych neg Seizures PSYCHIATRIC DISORDERS Anxiety     TIA Neuromuscular disease    GI/Hepatic ,GERD  ,,(+)     (-) substance abuse    Endo/Other  diabetesHypothyroidism    Renal/GU negative Renal ROS     Musculoskeletal   Abdominal   Peds  Hematology   Anesthesia Other Findings Patient very confused this morning. Patient's son was at  bedside. States that the patient's baseline mental status has been decreased for the past few weeks with her UTI. Patient is confused on exam and was unsure what procedure we were performing today. Discussed risks with son including prolonged emergence and post op delirium due to the patient's age and mental status. Son agreed to proceed.   Past Medical History: No date: Allergy No date: Anxiety No date: Balance disorder No date: Chronic diastolic CHF (congestive heart failure) (HCC) No date: Colon polyps No date: Coronary artery disease, non-occlusive     Comment:  a. LHC 11/2003: 10% LAD stenosis, 20% LCx stenosis, and               40% RCA stenosis; b. nuc stress test 12/15: small region               of mild ischemia in the apical region with WMA also noted              in the apical region, EF 60%. 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 CHADS2VASc = > 8 (CHF, HTN, age x 2, DM, TIA x 2,               female); b. on Xarelto No date: SBO (small bowel obstruction) (HCC)     Comment:  1998 No date: TIA (transient ischemic attack) No date: UTI (urinary tract infection) No date: Venous insufficiency   Reproductive/Obstetrics                             Anesthesia Physical Anesthesia Plan  ASA: 3  Anesthesia Plan: General   Post-op Pain Management:    Induction: Intravenous  PONV Risk Score and Plan: 4 or greater and Ondansetron, Dexamethasone and Treatment may vary due to age or medical condition  Airway Management Planned: Oral ETT and LMA  Additional Equipment: None  Intra-op Plan:   Post-operative Plan: Extubation in OR  Informed Consent: I have reviewed the patients History and Physical, chart, labs and discussed the procedure including the risks, benefits and alternatives for the proposed anesthesia with the patient or authorized representative who has indicated his/her understanding and acceptance.     Dental advisory given  Plan Discussed with: CRNA and Surgeon  Anesthesia Plan Comments: (Patient and patient's son consented for risks of anesthesia including but not limited to:  - adverse reactions to medications - damage to eyes, teeth, lips or other oral mucosa - nerve damage due to positioning  - sore throat or hoarseness - Damage to heart, brain, nerves, lungs, other parts of body or loss of life  Patient and patient's son voiced understanding and assent.)        Anesthesia Quick Evaluation                                  Anesthesia Evaluation  Patient identified by MRN, date of birth, ID band Patient awake    Reviewed: Allergy & Precautions, NPO status , Patient's Chart, lab work & pertinent test results  History of Anesthesia Complications Negative for: history of anesthetic complications  Airway Mallampati: III  TM Distance: >3 FB Neck ROM: Full    Dental   Bridges :   Pulmonary neg pulmonary ROS,    Pulmonary exam normal breath sounds clear to auscultation       Cardiovascular hypertension, + CAD and +CHF  + dysrhythmias (a fib on xarelto)  Rhythm:Irregular Rate:Normal  ECG 08/21/17: atrial  fibrillation; left axis deviation; septal infarct, age undetermined  Myocardial perfusion 04/06/17:   Horizontal ST segment depression ST segment depression of 1 mm was noted during stress in the II, III and aVF leads.  The study is normal.  This is a low risk study.  The left ventricular ejection fraction is hyperdynamic (>65%).   Neuro/Psych PSYCHIATRIC DISORDERS Anxiety Depression TIAnegative neurological ROS     GI/Hepatic GERD  ,Esophageal web, hx SBO, IBS   Endo/Other  diabetes, Type 2Hypothyroidism   Renal/GU negative Renal ROS     Musculoskeletal   Abdominal   Peds  Hematology negative hematology ROS (+)  Anesthesia Other Findings Cardiology note 08/21/17:  ASSESSMENT AND PLAN:   Mixed hyperlipidemia  CT scan including mild coronary calcifications, aortic arch calcifications, moderate aortic atherosclerosis in the abdominal aorta  does not want a statin stable  No further work-up   Essential hypertension, benign - Plan: EKG 12-Lead Blood pressure is well controlled on today's visit. No changes made to the medications.   Persistent atrial fibrillation (HCC) Heart rate well controlled,  Tolerating Xarelto, no recent falls No changes made   Dark urine She is concerned about hematuria but no proof of this Recommended if she has any other days with very dark urine that she contact primary care, may need urinalysis  if she does have confirmation of hematuria would likely benefit from urology but currently reports urine is back to normal color   Chronic diastolic CHF (congestive heart failure) (HCC) Takes Lasix daily, occasionally with extra dose potassium daily Appears euvolemic    Total encounter time more than 45 minutes  Greater than 50% was spent in counseling and coordination of care with the patient   Disposition:   F/U  12 months  Reproductive/Obstetrics                            Anesthesia Physical Anesthesia  Plan  ASA: III  Anesthesia Plan: General and Bier Block and Bier Block-LIDOCAINE ONLY   Post-op Pain Management:  Regional for Post-op pain and GA combined w/ Regional for post-op pain   Induction: Intravenous  PONV Risk Score and Plan: 3 and Propofol infusion and TIVA  Airway Management Planned: Natural Airway  Additional Equipment:   Intra-op Plan:   Post-operative Plan:   Informed Consent: I have reviewed the patients History and Physical, chart, labs and discussed the procedure including the risks, benefits and alternatives for the proposed anesthesia with the patient or authorized representative who has indicated his/her understanding and acceptance.     Plan Discussed with: CRNA  Anesthesia Plan Comments:         Anesthesia Quick Evaluation

## 2023-05-25 NOTE — Assessment & Plan Note (Signed)
-   Hold off on rate controlling agents given hypotension - Hold home Eliquis; can consider heparin if patient is unable to take p.o. for prolonged time

## 2023-05-25 NOTE — Assessment & Plan Note (Signed)
 -  Hold home regimen - SSI, very sensitive

## 2023-05-25 NOTE — Progress Notes (Signed)
[facility] driver took [facility] [place] [phone]) took patient wheelchair back to white [employer].

## 2023-05-25 NOTE — Assessment & Plan Note (Addendum)
Per consult note from anesthesiology, initial concern for delirium, however encephalopathy is in the setting of septic shock.  High risk for aspiration and respiratory compromise, however patient is DNI.  - N.p.o.

## 2023-05-25 NOTE — Transfer of Care (Signed)
Immediate Anesthesia Transfer of Care Note  Patient: Jodi Reeves  Procedure(s) Performed: CYSTOSCOPY/URETEROSCOPY/HOLMIUM LASER/STENT EXCHANGE (Right)  Patient Location: PACU  Anesthesia Type:General  Level of Consciousness: drowsy, patient cooperative, and confused  Airway & Oxygen Therapy: Patient Spontanous Breathing and Patient connected to face mask oxygen  Post-op Assessment: Report given to RN, Post -op Vital signs reviewed and stable, and Patient moving all extremities X 4  Post vital signs: Reviewed and stable  Last Vitals:  Vitals Value Taken Time  BP 181/109 05/25/23 1247  Temp    Pulse 76 05/25/23 1250  Resp 17 05/25/23 1250  SpO2 96 % 05/25/23 1250  Vitals shown include unfiled device data.  Last Pain:  Vitals:   05/25/23 1011  TempSrc: Temporal  PainSc: 0-No pain         Complications: No notable events documented.

## 2023-05-25 NOTE — H&P (Signed)
05/25/23 11:31 AM   Jodi Reeves 1928-01-14 [id number]  CC: Right ureteral stone  HPI: Very comorbid and frail 89 year old female who previously was admitted with sepsis from urinary source with a 2 mm right distal ureteral stone, underwent urgent stent placement at that time with Dr. Cardell Peach.  Here today for definitive ureteroscopy.   PMH: Past Medical History:  Diagnosis Date   (HFpEF) heart failure with preserved ejection fraction (HCC)    a.) TTE 04/27/2015: EF 65-70%, mild LVH, G2DD, mild RAE, deg MV with mild MAC, AoV sclerosis without stenosis, mild-mod TR, RVSP 30-35; b.) TTE 10/24/2018: EF 55-60%, no RWMAs, G1DD. mod LAE, RVSP 45.8, mild-mod TR; c.) TTE 04/27/2023: EF 50-55%, no RWMAs, mildly reduced RVSF, mod BAE, RVSP 47.4, mild AR/MR, mod TR   Allergy    Anxiety    Aortic atherosclerosis (HCC)    B12 deficiency    Balance disorder    Cardiomegaly    Colon polyps    Coronary artery disease, non-occlusive    a.) LHC 11/2003: 10% LAD, 20% LCx , 40% RCA - med mgmt; b.) nuc stress test 12/15: small region of mild ischemia in the apical region with WMA also noted in the apical region, EF 60%; declined invasive evaluation   DDD (degenerative disc disease), lumbar    DM2 (diabetes mellitus, type 2) (HCC)    Dysphagia    Esophageal obstruction    Esophageal web    Falls frequently    a.) associated with multiple associated injuries (left shoudler and left wrist fractures; left hand paresthesias)   GERD (gastroesophageal reflux disease)    Granuloma annulare 2010   History of chicken pox    History of eating disorder    HLD (hyperlipidemia)    HTN (hypertension)    Hypothyroidism    Incontinence of bowel    Insomnia    a.) takes melatonin PRN   Irritable bowel syndrome with diarrhea    Lymphedema of both lower extremities    Nephrolithiasis    On apixaban therapy    Osteopenia    Osteoporosis    Ovarian cancer (HCC) 1980   Persistent atrial fibrillation (HCC) 2005    a.) Dx'd in 2005; b.) CHA2DS2-VASc = 68 (age x2, sex, HFpEF, HTN, TIA x 2, vascular disease, T2DM) as of 05/24/2023; c.) cardiac rate/rhythm maintained on oral bisoprolol; chronically anticoagulated using apixaban   Poor memory    Pulmonary hypertension (HCC)    a.) TTE 04/27/2015:  RVSP 30-35 mmHg; b.) TTE 10/24/2018: RVSP 45.8 mmHg; c.) TTE 04/27/2023:  RVSP 47.4 mmHg   SBO (small bowel obstruction) (HCC) 1998   Scoliosis    TIA (transient ischemic attack)    Venous insufficiency    Ventral hernia     Surgical History: Past Surgical History:  Procedure Laterality Date   2nd look laparotomy  1982   ABDOMINAL HYSTERECTOMY     for ovarian cancer s/p hysterectomy total 1976 and exp lap 1980   APPENDECTOMY     CARDIAC CATHETERIZATION  11/2003   neg; a fib found    CARPAL TUNNEL RELEASE Left 03/06/2018   Procedure: CARPAL TUNNEL RELEASE ENDOSCOPIC;  Surgeon: Christena Flake, MD;  Location: [facility];  Service: Orthopedics;  Laterality: Left;  diabetic - oral meds   CARPAL TUNNEL RELEASE Left    Procedure: CARPAL TUNNEL RELEASE; Location: [facility]; Surgeon: Leron Croak, MD   CATARACT EXTRACTION Right 08/2005   cataract OD  2002   CHOLECYSTECTOMY  1986  CYSTOSCOPY WITH RETROGRADE PYELOGRAM, URETEROSCOPY AND STENT PLACEMENT Right 04/25/2023   Procedure: CYSTOSCOPY WITH RETROGRADE PYELOGRAM, URETEROSCOPY AND STENT PLACEMENT;  Surgeon: Jannifer Hick, MD;  Location: [facility];  Service: Urology;  Laterality: Right;   DEXA  9/04 and 1/02   EGD/dilation/colon  04/2004   ESOPHAGOGASTRODUODENOSCOPY (EGD) WITH PROPOFOL N/A 05/16/2016   Procedure: ESOPHAGOGASTRODUODENOSCOPY (EGD) WITH PROPOFOL with dilation;  Surgeon: Midge Minium, MD;  Location: [facility];  Service: Endoscopy;  Laterality: N/A;   FEMORAL HERNIA REPAIR Right 1958   fracture L elbow/wrist  2000   INTRAMEDULLARY (IM) NAIL INTERTROCHANTERIC Left 10/12/2020   Procedure: INTRAMEDULLARY (IM) NAIL INTERTROCHANTRIC;  Surgeon:  Juanell Fairly, MD;  Location: [facility];  Service: Orthopedics;  Laterality: Left;   intussception/obstruction  03/1997   kidney stone x2  1991   KYPHOPLASTY N/A 04/13/2020   Procedure: [id number];  Surgeon: Kennedy Bucker, MD;  Location: [facility];  Service: Orthopedics;  Laterality: N/A;   KYPHOPLASTY N/A 05/18/2020   Procedure: L1 KYPHOPLASTY;  Surgeon: Kennedy Bucker, MD;  Location: [facility];  Service: Orthopedics;  Laterality: N/A;   KYPHOPLASTY N/A 07/06/2020   Procedure: L4  KYPHOPLASTY;  Surgeon: Kennedy Bucker, MD;  Location: [facility];  Service: Orthopedics;  Laterality: N/A;   laminectomy L4-5  1971   LUMBAR LAMINECTOMY     1970 ruptured disc    MOUTH SURGERY     myoview stress  11/2003   syncope (-)   REVERSE SHOULDER ARTHROPLASTY Left 10/06/2014   Procedure: REVERSE SHOULDER ARTHROPLASTY;  Surgeon: Christena Flake, MD;  Location: [facility];  Service: Orthopedics;  Laterality: Left;   stillbirth  1969   TOTAL ABDOMINAL HYSTERECTOMY W/ BILATERAL SALPINGOOPHORECTOMY  1980   ovarian cancer   TOTAL SHOULDER ARTHROPLASTY Left    WRIST FRACTURE SURGERY Right 02/2004    Family History: Family History  Problem Relation Age of Onset   Early death Father    Diabetes Mother    Heart disease Mother    Arthritis Brother    Cancer Brother    Depression Brother    Hearing loss Brother    Arthritis Brother    Cancer Brother        colon   Hearing loss Brother    Cancer Brother    Diabetes Brother    Hearing loss Brother    Heart disease Brother    Hyperlipidemia Brother    Heart disease Sister    Hypertension Sister    Stroke Sister    Hearing loss Daughter    Hypertension Daughter    Diabetes Paternal Grandfather    Hearing loss Sister    Heart disease Sister    Arthritis Sister    Depression Sister    Hearing loss Sister    Heart disease Sister    COPD Brother    Early death Brother    Early death Brother     Social History:  reports that she has never smoked.  She has never used smokeless tobacco. She reports that she does not drink alcohol and does not use drugs.  Physical Exam: Temp 97.9 F (36.6 C) (Temporal)   Ht 5\' 3"  (1.6 m)   Wt 82.3 kg   BMI 32.14 kg/m    Constitutional:  Alert and oriented, No acute distress. Cardiovascular: Regular rate and rhythm Respiratory: Clear to auscultation bilaterally GI: Abdomen is soft, nontender, nondistended, no abdominal masses   Laboratory Data: Urine culture 1/18 with 75k mixed flora, treated with nitrofurantoin    Assessment &  Plan:   89 year old female who previously presented with sepsis from urinary source and 2 mm right distal ureteral stone underwent urgent stent placement at that time.  We specifically discussed the risks ureteroscopy including bleeding, infection/sepsis, stent related symptoms including flank pain/urgency/frequency/incontinence/dysuria, ureteral injury, ureteral stricture, inability to access stone, or need for staged or additional procedures.  Right ureteroscopy, laser lithotripsy, stent change/removal  Legrand Rams, MD 05/25/2023  [facility] [address], [address] [place], [place] [address] [phone]

## 2023-05-25 NOTE — Progress Notes (Signed)
Very frail 89 yo F POD#0 right ureteroscopy and stent change. Was previously hospitalized for sepsis from urinary source and 2mm right ureteral stone, urgently stented by Dr Cardell Peach at that time and had prolonged hospitalization.  Preop urine culture (media tab) grew 75k mixed flora, was treated with nitrofurantoin.  Suspect confusion and fever from UTI/urosepsis after ureteroscopy, recommend meropenum based on prior culture data from 04/25/23. Ureteral stent should remain in place, can place foley to maximize drainage. Agree with admission to hospitalist/ICU as needed for antibiotics and resuscitation.  Legrand Rams, MD 05/25/2023

## 2023-05-25 NOTE — Assessment & Plan Note (Signed)
-  Resume home Synthroid when able

## 2023-05-25 NOTE — Assessment & Plan Note (Signed)
-   IV fluids as ordered - Avoid nephrotoxic agents - Strict in and out - Daily BMP - Continue Foley catheter

## 2023-05-25 NOTE — Assessment & Plan Note (Signed)
Patient initially presented for scheduled ureteroscopy with stent replacement, however postoperatively rapidly developed hypotension, tachycardia, high fever.  This is complicated by acute encephalopathy, hypoxic respiratory failure, AKI, shock liver and demand ischemia.  Unclear how patient was feeling prior to surgery and whether she was experiencing any infectious symptoms at the SN[facility].  Likely related to recent admission for septic shock in the setting of ESBL E. coli bacteremia.  - S/p 3 L bolus - Start Levophed per peripheral IV - Critical care consulted - Meropenem per pharmacy dosing - Continue to trend lactic acid

## 2023-05-25 NOTE — H&P (Signed)
History and Physical    Patient: Jodi Reeves ZOX:[id number] DOB: Jul 12, 1927 DOA: 05/25/2023 DOS: the patient was seen and examined on 05/25/2023 PCP: Sherol Dade, DO  Patient coming from: SNF  Chief Complaint: No chief complaint on file.  HPI: Jodi Reeves is a 89 y.o. female with medical history significant of Recent admission for septic shock secondary to ESBL E. coli bacteremia and obstructive uropathy s/p cystoscopy with stent placement, permanent atrial fibrillation on Eliquis, HFpEF, hypertension, nonobstructive CAD, type 2 diabetes, hypothyroidism, hyperlipidemia, osteoporosis, ovarian cancer, pulmonary hypertension, who presents to the hospital due to planned cystoscopy with stent exchange.   History cannot be obtained from patient due to altered mental status.  Her son at bedside states that she has never returned back to normal since she was discharged from the hospital to the nursing facility.  She notes that she has remained much weaker and is essentially wheelchair-bound.  In general, her memory is intact but she does seem forgetful and occasionally fixates on things.  He reports that the facility has not reported any recent fevers, chills, vomiting, shortness of breath or urinary symptoms.  Per RN, postoperatively, patient became tachy, hypotensive and when they attempted to stand her up, she had a bowel movement on herself and then slumped over in the chair.  She did not lose consciousness or pulses but was very altered and unable to go coherently answer any questions.  She did not recognize her family at bedside.  Hospital course: On arrival to the preop area, patient was afebrile at 97.9.  Postoperatively, she was noted to have a blood pressure of 109/35 with heart rate of 110, and atrial fibrillation with RVR.  She was afebrile at 100 but subsequently developed a fever up to 102.5.  She was saturating at 97% on 2 L.  Due to concern for altered mental status, TRH  was consulted.  Review of Systems: As mentioned in the history of present illness. All other systems reviewed and are negative.  Past Medical History:  Diagnosis Date   (HFpEF) heart failure with preserved ejection fraction (HCC)    a.) TTE 04/27/2015: EF 65-70%, mild LVH, G2DD, mild RAE, deg MV with mild MAC, AoV sclerosis without stenosis, mild-mod TR, RVSP 30-35; b.) TTE 10/24/2018: EF 55-60%, no RWMAs, G1DD. mod LAE, RVSP 45.8, mild-mod TR; c.) TTE 04/27/2023: EF 50-55%, no RWMAs, mildly reduced RVSF, mod BAE, RVSP 47.4, mild AR/MR, mod TR   Allergy    Anxiety    Aortic atherosclerosis (HCC)    B12 deficiency    Balance disorder    Cardiomegaly    Colon polyps    Coronary artery disease, non-occlusive    a.) LHC 11/2003: 10% LAD, 20% LCx , 40% RCA - med mgmt; b.) nuc stress test 12/15: small region of mild ischemia in the apical region with WMA also noted in the apical region, EF 60%; declined invasive evaluation   DDD (degenerative disc disease), lumbar    DM2 (diabetes mellitus, type 2) (HCC)    Dysphagia    Esophageal obstruction    Esophageal web    Falls frequently    a.) associated with multiple associated injuries (left shoudler and left wrist fractures; left hand paresthesias)   GERD (gastroesophageal reflux disease)    Granuloma annulare 2010   History of chicken pox    History of eating disorder    HLD (hyperlipidemia)    HTN (hypertension)    Hypothyroidism    Incontinence of bowel  Insomnia    a.) takes melatonin PRN   Irritable bowel syndrome with diarrhea    Lymphedema of both lower extremities    Nephrolithiasis    On apixaban therapy    Osteopenia    Osteoporosis    Ovarian cancer (HCC) 1980   Persistent atrial fibrillation (HCC) 2005   a.) Dx'd in 2005; b.) CHA2DS2-VASc = 7 (age x2, sex, HFpEF, HTN, TIA x 2, vascular disease, T2DM) as of 05/24/2023; c.) cardiac rate/rhythm maintained on oral bisoprolol; chronically anticoagulated using apixaban   Poor  memory    Pulmonary hypertension (HCC)    a.) TTE 04/27/2015:  RVSP 30-35 mmHg; b.) TTE 10/24/2018: RVSP 45.8 mmHg; c.) TTE 04/27/2023:  RVSP 47.4 mmHg   SBO (small bowel obstruction) (HCC) 1998   Scoliosis    TIA (transient ischemic attack)    Venous insufficiency    Ventral hernia    Past Surgical History:  Procedure Laterality Date   2nd look laparotomy  1982   ABDOMINAL HYSTERECTOMY     for ovarian cancer s/p hysterectomy total 1976 and exp lap 1980   APPENDECTOMY     CARDIAC CATHETERIZATION  11/2003   neg; a fib found    CARPAL TUNNEL RELEASE Left 03/06/2018   Procedure: CARPAL TUNNEL RELEASE ENDOSCOPIC;  Surgeon: Christena Flake, MD;  Location: [facility];  Service: Orthopedics;  Laterality: Left;  diabetic - oral meds   CARPAL TUNNEL RELEASE Left    Procedure: CARPAL TUNNEL RELEASE; Location: [facility]; Surgeon: Leron Croak, MD   CATARACT EXTRACTION Right 08/2005   cataract OD  2002   CHOLECYSTECTOMY  1986   CYSTOSCOPY WITH RETROGRADE PYELOGRAM, URETEROSCOPY AND STENT PLACEMENT Right 04/25/2023   Procedure: CYSTOSCOPY WITH RETROGRADE PYELOGRAM, URETEROSCOPY AND STENT PLACEMENT;  Surgeon: Jannifer Hick, MD;  Location: [facility];  Service: Urology;  Laterality: Right;   DEXA  9/04 and 1/02   EGD/dilation/colon  04/2004   ESOPHAGOGASTRODUODENOSCOPY (EGD) WITH PROPOFOL N/A 05/16/2016   Procedure: ESOPHAGOGASTRODUODENOSCOPY (EGD) WITH PROPOFOL with dilation;  Surgeon: Midge Minium, MD;  Location: [facility];  Service: Endoscopy;  Laterality: N/A;   FEMORAL HERNIA REPAIR Right 1958   fracture L elbow/wrist  2000   INTRAMEDULLARY (IM) NAIL INTERTROCHANTERIC Left 10/12/2020   Procedure: INTRAMEDULLARY (IM) NAIL INTERTROCHANTRIC;  Surgeon: Juanell Fairly, MD;  Location: [facility];  Service: Orthopedics;  Laterality: Left;   intussception/obstruction  03/1997   kidney stone x2  1991   KYPHOPLASTY N/A 04/13/2020   Procedure: [id number];  Surgeon: Kennedy Bucker, MD;   Location: [facility];  Service: Orthopedics;  Laterality: N/A;   KYPHOPLASTY N/A 05/18/2020   Procedure: L1 KYPHOPLASTY;  Surgeon: Kennedy Bucker, MD;  Location: [facility];  Service: Orthopedics;  Laterality: N/A;   KYPHOPLASTY N/A 07/06/2020   Procedure: L4  KYPHOPLASTY;  Surgeon: Kennedy Bucker, MD;  Location: [facility];  Service: Orthopedics;  Laterality: N/A;   laminectomy L4-5  1971   LUMBAR LAMINECTOMY     1970 ruptured disc    MOUTH SURGERY     myoview stress  11/2003   syncope (-)   REVERSE SHOULDER ARTHROPLASTY Left 10/06/2014   Procedure: REVERSE SHOULDER ARTHROPLASTY;  Surgeon: Christena Flake, MD;  Location: [facility];  Service: Orthopedics;  Laterality: Left;   stillbirth  1969   TOTAL ABDOMINAL HYSTERECTOMY W/ BILATERAL SALPINGOOPHORECTOMY  1980   ovarian cancer   TOTAL SHOULDER ARTHROPLASTY Left    WRIST FRACTURE SURGERY Right 02/2004   Social History:  reports that she has never smoked.  She has never used smokeless tobacco. She reports that she does not drink alcohol and does not use drugs.  Allergies  Allergen Reactions   Amiodarone Other (See Comments)    Severe Thyroid issues    Fosamax [Alendronate]     dysphagia   Penicillins Anaphylaxis and Other (See Comments)    Tolerated 1st generation cephalosporin (CEFAZOLIN) 07/06/2020, 10/12/2020, 04/25/2023)   Sulfa Antibiotics Itching and Rash    Itching    Actos [Pioglitazone]     Not effective    Amaryl [Glimepiride]     Not effective     Atorvastatin Other (See Comments)    Muscle aches & All statins per Patient   Bentyl [Dicyclomine Hcl]     Could not tolerate nausea, reduced concentration, h/a    Ciprofloxacin Other (See Comments)    High blood pressure     Codeine Nausea And Vomiting   Ezetimibe-Simvastatin Other (See Comments)    Muscle aches, nausea, back pain    Lovastatin Other (See Comments)    Myalgias   Macrobid [Nitrofurantoin Macrocrystal]     Severe Itching, rash    Protonix [Pantoprazole  Sodium]     Esophageal problems  Wants removed from list   Rosiglitazone Maleate Other (See Comments)    Edema   Statins     Muscle and joint aches to all statins   Victoza [Liraglutide]     Nausea    Alendronate Sodium Rash    Dysphagia and ulceration    Bactrim [Sulfamethoxazole-Trimethoprim] Rash    itching    Family History  Problem Relation Age of Onset   Early death Father    Diabetes Mother    Heart disease Mother    Arthritis Brother    Cancer Brother    Depression Brother    Hearing loss Brother    Arthritis Brother    Cancer Brother        colon   Hearing loss Brother    Cancer Brother    Diabetes Brother    Hearing loss Brother    Heart disease Brother    Hyperlipidemia Brother    Heart disease Sister    Hypertension Sister    Stroke Sister    Hearing loss Daughter    Hypertension Daughter    Diabetes Paternal Grandfather    Hearing loss Sister    Heart disease Sister    Arthritis Sister    Depression Sister    Hearing loss Sister    Heart disease Sister    COPD Brother    Early death Brother    Early death Brother     Prior to Admission medications   Medication Sig Start Date End Date Taking? Authorizing Provider  acetaminophen (TYLENOL) 500 MG tablet Take 1,000 mg by mouth every 6 (six) hours as needed for moderate pain (pain score 4-6).   Yes [provider]  alum & mag hydroxide-simeth (MAALOX/MYLANTA) 200-200-20 MG/5ML suspension Take 30 mLs by mouth every 4 (four) hours as needed for indigestion. 10/20/20  Yes Pennie Banter, DO  apixaban (ELIQUIS) 5 MG TABS tablet Take 5 mg by mouth 2 (two) times daily.   Yes [provider]  Ascorbic Acid (VITAMIN C) 1000 MG tablet Take 2,000 mg by mouth daily.   Yes [provider]  bisoprolol (ZEBETA) 5 MG tablet Take 5 mg by mouth every 12 (twelve) hours.   Yes [provider]  CALCIUM ANTACID EXTRA STRENGTH 750 MG chewable tablet Chew 2 tablets by mouth  daily as  needed (indigestion). 08/22/21  Yes [provider]  Cyanocobalamin (VITAMIN B-12) 5000 MCG LOZG Take 5,000 mcg by mouth daily.   Yes [provider]  diclofenac Sodium (VOLTAREN) 1 % GEL Apply 2 g topically in the morning, at noon, and at bedtime.   Yes [provider]  docusate sodium (COLACE) 100 MG capsule Take 100 mg by mouth daily as needed for mild constipation.   Yes [provider]  estradiol (ESTRACE) 0.1 MG/GM vaginal cream APPLY 0.5MG  (PEA SIZED AMOUNT) JUST INSIDE THE VAGINA WITH FINGER-TIP ON MONDAY, WEDNESDAY AND FRIDAY NIGHTS. 07/21/20  Yes McGowan, Carollee Herter A, PA-C  famotidine (PEPCID) 20 MG tablet Take 20 mg by mouth at bedtime. 12/29/21  Yes [provider]  fluticasone (FLONASE) 50 MCG/ACT nasal spray Place 1 spray into both nostrils daily.   Yes [provider]  folic acid (FOLVITE) 1 MG tablet Take 1 tablet (1 mg total) by mouth daily. 10/21/20  Yes Esaw Grandchild A, DO  furosemide (LASIX) 20 MG tablet Take 1 tablet (20 mg total) by mouth daily. 05/07/23  Yes Marcelino Duster, MD  hydrOXYzine (ATARAX) 25 MG tablet Take 25 mg by mouth every 8 (eight) hours as needed for anxiety.   Yes [provider]  insulin aspart (NOVOLOG) 100 UNIT/ML injection Inject 0-15 Units into the skin 4 (four) times daily -  before meals and at bedtime. Patient taking differently: Inject 0-12 Units into the skin 2 (two) times daily. SS:200-249=2u, 250-299=4u, 300-349=6u, 350-399=8u, 400-449=10u, 450-499=12u, if 500 or greater= CALL MD 10/20/20  Yes Esaw Grandchild A, DO  insulin glargine-yfgn (SEMGLEE, YFGN,) 100 UNIT/ML injection Inject 8 Units into the skin at bedtime.   Yes [provider]  levothyroxine (SYNTHROID, LEVOTHROID) 50 MCG tablet Take 50 mcg by mouth daily before breakfast.   Yes [provider]  Magnesium Oxide 250 MG TABS Take 250 mg by mouth daily.   Yes [provider]  melatonin 5 MG TABS Take 1  tablet (5 mg total) by mouth at bedtime as needed. 05/07/23  Yes Marcelino Duster, MD  Multiple Vitamins-Iron (DAILY MULTIVITAMINS/IRON PO) Take 1 tablet by mouth daily.   Yes [provider]  Multiple Vitamins-Minerals (HEALTHY EYES SUPERVISION 2) CAPS Take 1 capsule by mouth 2 (two) times daily.   Yes [provider]  nitrofurantoin (MACRODANTIN) 100 MG capsule Take 1 capsule (100 mg total) by mouth daily for 5 days. 05/25/23 05/30/23 Yes Sondra Come, MD  nystatin powder Apply 1 Application topically every 8 (eight) hours as needed (under the breasts for rash).   Yes [provider]  Olopatadine HCl 0.2 % SOLN Place 1 drop into both eyes daily as needed (Allergies).   Yes [provider]  Omega-3 Fatty Acids (SUPER OMEGA 3 EPA/DHA) 1000 MG CAPS Take 1,000 mg by mouth in the morning and at bedtime.   Yes [provider]  ondansetron (ZOFRAN) 4 MG tablet Take 1 tablet (4 mg total) by mouth every 6 (six) hours as needed for nausea. 10/20/20  Yes Esaw Grandchild A, DO  pyridOXINE (B-6) 50 MG tablet Take 1 tablet (50 mg total) by mouth daily. 10/21/20  Yes Pennie Banter, DO  simethicone (MYLICON) 80 MG chewable tablet Chew 80 mg by mouth every 6 (six) hours as needed for flatulence.   Yes [provider]  TRADJENTA 5 MG TABS tablet Take 5 mg by mouth daily. 10/05/20  Yes [provider]  glucose blood (ONE TOUCH ULTRA  TEST) test strip Use to test blood sugar once daily E11.49 06/24/15   Bedsole, Amy E, MD  magnesium hydroxide (MILK OF MAGNESIA) 400 MG/5ML suspension Take 30 mLs by mouth daily as needed for mild constipation. 10/20/20   Pennie Banter, DO  Nystatin (GERHARDT'S BUTT CREAM) CREA Apply 1 Application topically 2 (two) times daily. 05/07/23   Marcelino Duster, MD  Skin Protectants, Misc. (DIMETHICONE-ZINC OXIDE) cream Apply topically 2 (two) times daily as needed for dry skin. Please label the bottle for the vaginal area  only. Patient not taking: Reported on 04/25/2023 06/29/20   Harle Battiest, PA-C    Physical Exam: Vitals:   05/25/23 1924 05/25/23 1926 05/25/23 1928 05/25/23 1930  BP: (!) 78/54 (!) 82/50 (!) 87/47 (!) 72/45  Pulse: 100 99 97 (!) 120  Resp: (!) 26 20 (!) 25 20  Temp:    98 F (36.7 C)  TempSrc:      SpO2: 100% 98% 99% 96%  Weight:      Height:       Physical Exam Vitals and nursing note reviewed.  Constitutional:      Appearance: She is ill-appearing.     Comments: Lethargic but awakens to voice.  Unable to answer questions coherently.  HENT:     Head: Normocephalic and atraumatic.     Mouth/Throat:     Mouth: Mucous membranes are dry.  Eyes:     Pupils: Pupils are equal, round, and reactive to light.  Cardiovascular:     Rate and Rhythm: Tachycardia present. Rhythm irregular.     Heart sounds: No murmur heard. Pulmonary:     Effort: Tachypnea present. No accessory muscle usage.     Breath sounds: No decreased breath sounds, wheezing, rhonchi or rales.  Abdominal:     General: There is no distension.     Palpations: Abdomen is soft.     Tenderness: There is no abdominal tenderness.  Musculoskeletal:     Right lower leg: 2+ Pitting Edema present.     Left lower leg: 2+ Pitting Edema present.  Skin:    General: Skin is warm and dry.  Neurological:     Mental Status: She is disoriented and confused.    Data Reviewed: CBC with WBC of 8.4, hemoglobin of 10.9, platelets of 134 CMP with sodium of 137, potassium 3.4, bicarb 21, glucose 118, BUN 20, creatinine 1.17, alkaline phosphatase 318, albumin 2.2, AST 355, ALT 48, total bilirubin 2.3 and GFR 44 BNP 256 Troponin 41 Lactic acid 5.9 Urinalysis with large hematuria, ketonuria, proteinuria, but no bacteria  EKG personally reviewed.  Atrial fibrillation with a rate of 110.  No ST depression or elevation.  No T wave inversion.  DG Chest 1 View Result Date: 05/25/2023 CLINICAL DATA:  Short of breath, fever EXAM:  CHEST  1 VIEW COMPARISON:  04/28/2023 FINDINGS: Single frontal view of the chest demonstrates an unremarkable cardiac silhouette. Small left pleural effusion and left basilar consolidation are unchanged since prior exam. No pneumothorax. No acute bony abnormalities. IMPRESSION: 1. Stable left basilar consolidation and small left effusion. Electronically Signed   By: Sharlet Salina M.D.   On: 05/25/2023 17:20   DG OR UROLOGY CYSTO IMAGE ([facility] ONLY) Result Date: 05/25/2023 There is no interpretation for this exam.  This order is for images obtained during a surgical procedure.  Please See "Surgeries" Tab for more information regarding the procedure.   Results are pending, will review when available.  Assessment and Plan:  *  Septic shock [facility] Patient initially presented for scheduled ureteroscopy with stent replacement, however postoperatively rapidly developed hypotension, tachycardia, high fever.  This is complicated by acute encephalopathy, hypoxic respiratory failure, AKI, shock liver and demand ischemia.  Unclear how patient was feeling prior to surgery and whether she was experiencing any infectious symptoms at the SNF.  Likely related to recent admission for septic shock in the setting of ESBL E. coli bacteremia.  - S/p 3 L bolus - Start Levophed per peripheral IV - Critical care consulted - Meropenem per pharmacy dosing - Continue to trend lactic acid  Acute encephalopathy Per consult note from anesthesiology, initial concern for delirium, however encephalopathy is in the setting of septic shock.  High risk for aspiration and respiratory compromise, however patient is DNI.  - N.p.o.  Acute hypoxic respiratory failure (HCC) Likely secondary to sepsis and acute encephalopathy.  - Continue supplemental oxygen to maintain oxygen saturation above 88% - Wean as tolerated  Demand ischemia (HCC) Minimally increased troponin at 41 with no ischemic changes on EKG. Recent echocardiogram  obtained earlier this month with no regional wall motion abnormalities.  - Repeat troponin pending  Shock liver Increasing LFTs consistent with suspected shock liver given profound hypotension.  - Continue to trend LFTs  AKI (acute kidney injury) (HCC) - IV fluids as ordered - Avoid nephrotoxic agents - Strict in and out - Daily BMP - Continue Foley catheter  Atrial fibrillation with RVR (HCC) - Hold off on rate controlling agents given hypotension - Hold home Eliquis; can consider heparin if patient is unable to take p.o. for prolonged time  Chronic heart failure with preserved ejection fraction (HFpEF) (HCC) Per chart review, patient has a history of HFpEF with last EF in January 2025 with LVEF of 50-55%. RV dysfunction noted as mild with elevated PASP in the setting of known pulmonary hypertension.  Given lower extremity edema, initial concern for heart failure however no other evidence of peripheral edema and no evidence of pulmonary edema.  Lower extremity edema likely dependent due to increased sedentary lifestyle. BNP only minimal increased compared to prior.   - Hold off on diuretics - Strict in and out - Daily weights  Goals of care, counseling/discussion Discussed patient's goals of care with her son at bedside.  He notes that she was adamant she would not want resuscitation in the event of cardiac or pulmonary arrest, however he would like for [place] to pursue IV medications as able including pressors, antibiotics and fluids.    Given patient's rapid deterioration, we had a frank discussion that patient's prognosis is very guarded even with aggressive management.  Type 2 diabetes mellitus with peripheral neuropathy (HCC) - Hold home regimen - SSI, very sensitive  Acute unilateral obstructive uropathy - Urology following; appreciate their recommendations - S/p uteroscopy with stent exchange today  Hypothyroidism - Resume home Synthroid when able  Advance Care  Planning:   Code Status: Limited: Do not attempt resuscitation (DNR) -DNR-LIMITED -Do Not Intubate/DNI   Confirmed by patient's son at bedside and available MOST form  Consults: Intensivist  Family Communication: Patient's son updated both in person and via telephone  Severity of Illness: The appropriate patient status for this patient is INPATIENT. Inpatient status is judged to be reasonable and necessary in order to provide the required intensity of service to ensure the patient's safety. The patient's presenting symptoms, physical exam findings, and initial radiographic and laboratory data in the context of their chronic comorbidities is felt to place them at  high risk for further clinical deterioration. Furthermore, it is not anticipated that the patient will be medically stable for discharge from the hospital within 2 midnights of admission.   * I certify that at the point of admission it is my clinical judgment that the patient will require inpatient hospital care spanning beyond 2 midnights from the point of admission due to high intensity of service, high risk for further deterioration and high frequency of surveillance required.*  Author: Verdene Lennert, MD 05/25/2023 7:50 PM  For on call review [email or web address].

## 2023-05-25 NOTE — Assessment & Plan Note (Signed)
-   Urology following; appreciate their recommendations - S/p uteroscopy with stent exchange today

## 2023-05-25 NOTE — Op Note (Signed)
Date of procedure: 05/25/23  Preoperative diagnosis:  Right ureteral stone  Postoperative diagnosis:  Same  Procedure: Cystoscopy, right diagnostic ureteroscopy, right retrograde pyelogram with intraoperative interpretation, right ureteral stent placement  Surgeon: Legrand Rams, MD  Anesthesia: General  Complications: None  Intraoperative findings:  Cloudy urine, unable to identify any ureteral or right renal stones, suspect 2mm stone passed spontaneously Uncomplicated stent placement with Dangler  EBL: Minimal  Specimens: None  Drains: Right 6 French by 24 cm ureteral stent  Indication: Jodi Reeves is a 89 y.o. patient who previously presented with sepsis from urinary source and a 2 mm right distal ureteral stone, she underwent emergent stent placement with Dr. Cardell Peach at that time, here today for definitive treatment with ureteroscopy.  After reviewing the management options for treatment, they elected to proceed with the above surgical procedure(s). We have discussed the potential benefits and risks of the procedure, side effects of the proposed treatment, the likelihood of the patient achieving the goals of the procedure, and any potential problems that might occur during the procedure or recuperation. Informed consent has been obtained.  Description of procedure:  The patient was taken to the operating room and general anesthesia was induced. SCDs were placed for DVT prophylaxis. The patient was placed in the dorsal lithotomy position, prepped and draped in the usual sterile fashion, and preoperative antibiotics were administered. A preoperative time-out was performed.   The 21 French rigid cystoscope was used to intubate the urethra and thorough cystoscopy was performed.  Urine was slightly cloudy and was irrigated to clear.  The right ureteral stent was grasped and pulled to the meatus, and a sensor wire advanced through the stent up to the kidney under fluoroscopic vision.   A semirigid long ureteroscope was advanced alongside the wire up to the proximal ureter and no stone or other ureteral abnormality was identified.  I then advanced a digital single-channel flexible ureteroscope over the wire up to the kidney under fluoroscopic vision.  Urine in the kidney was also cloudy which limited vision but thorough pyeloscopy of all calyces revealed no evidence of stones.  A retrograde pyelogram performed from the proximal ureter showed no extravasation or filling defects.  Careful pullback ureteroscopy showed no abnormalities in the ureter.  A wire was replaced into the right kidney and a 6 [place] by 24 cm stent was placed fluoroscopically with a curl in the upper pole as well as in the bladder.  The bladder was drained, the Dangler was secured to the suprapubic area with Mastisol and Tegaderm.  Disposition: Stable to PACU  Plan: Stent can be removed at her facility on 05/30/2023(Melissa please call on 2/5 to remind them) Nitrofurantoin prophylaxis with stent in place Follow-up with urology as needed  Legrand Rams, MD

## 2023-05-25 NOTE — Assessment & Plan Note (Signed)
Per chart review, patient has a history of HFpEF with last EF in January 2025 with LVEF of 50-55%. RV dysfunction noted as mild with elevated PASP in the setting of known pulmonary hypertension.  Given lower extremity edema, initial concern for heart failure however no other evidence of peripheral edema and no evidence of pulmonary edema.  Lower extremity edema likely dependent due to increased sedentary lifestyle. BNP only minimal increased compared to prior.   - Hold off on diuretics - Strict in and out - Daily weights

## 2023-05-25 NOTE — Procedures (Cosign Needed Addendum)
CENTRAL VENOUS CATHETER INSERTION PROCEDURE NOTE  Jodi Reeves  [id number]  [birth year]  Date:05/25/23  Time:11:05 PM   Provider Performing:Glendi Mohiuddin A Anna Genre   Procedure: Insertion of Non-tunneled Central Venous [phone]) with US guidance ([id number])   Indication(s) Medication administration and Difficult access  Consent Risks of the procedure as well as the alternatives and risks of each were explained to the patient and/or caregiver.  Consent for the procedure was obtained and is signed in the bedside chart  Anesthesia Topical only with 1% lidocaine   Timeout Verified patient identification, verified procedure, site/side was marked, verified correct patient position, special equipment/implants available, medications/allergies/relevant history reviewed, required imaging and test results available.  Sterile Technique Maximal sterile technique including full sterile barrier drape, hand hygiene, sterile gown, sterile gloves, mask, hair covering, sterile ultrasound probe cover (if used).  Procedure Description Area of catheter insertion was cleaned with chlorhexidine and draped in sterile fashion.  With real-time ultrasound guidance a central venous catheter was placed into the right internal jugular vein. Nonpulsatile blood flow and easy flushing noted in all ports.  The catheter was sutured in place and sterile dressing applied.  Complications/Tolerance None; patient tolerated the procedure well. Chest X-ray is ordered to verify placement for internal jugular or subclavian cannulation.   Chest x-ray is not ordered for femoral cannulation.  EBL Minimal  Specimen(s) None   Webb Silversmith, DNP, CCRN, FNP-C, AGACNP-BC Acute Care & Family Nurse Practitioner  [facility]  See Amion for personal pager PCCM on call pager [phone] until 7 am

## 2023-05-25 NOTE — Consult Note (Incomplete)
NAME:  Jodi Reeves, MRN:  [id number], DOB:  July 31, 1927, LOS: 0 ADMISSION DATE:  05/25/2023, CONSULTATION DATE:  05/25/23 REFERRING MD:  Verdene Lennert, REASON FOR CONSULT:  Septic Shock   BRIEF HPI  89 y.o with significant PMH of CAD, d CHF, HTN, GERD, HLD, hypothyroidism, TIA, persistent atrial fibrillation on Eliquis, T2DM, frequent UTIs and IBS who presented to the to the hospital on 05/25/2023 for elective definitive ureteroscopy.  Patient was recently admitted to the hospital from 01/25-01/13 with Acute Metabolic Encephalopathy in setting of Severe Sepsis due to  ESBL E. Coli BACTEREMIA due to Right Ureteral Stone infection, status post Cystoscopy and right ureteral stent placement complicated by shock.    Hospital Course: Patient underwent ureteroscopy and stent exchange. She was transferred to  PACU and hospitalist consulted for admission.  Initial vital signs showed HR of 108 beats/minute, BP mm Hg, the RR 15 breaths/minute, and the oxygen saturation 77% on HFNC and a temperature of 98.70F (37C).  Pertinent Labs/Diagnostics Findings: Na+/ K+: 135/3.7 Glucose: 178 BUN/Cr.:20/1.46 AST/ALT:355/48 WBC: 47.4 K/L Hgb/Hct:10.5/31.3 Plts: 140 PCT: 122.61  Lactic acid: 5.9~7.0~5.6 troponin: 43  BNP: 256.8  CXR>see result below Patient given 30 cc/kg of fluids and started on broad-spectrum for urosepsis with shock. Patient remained hypotensive despite IVF boluses therefore was started on Levophed.   Past Medical History  CAD, d CHF, HTN, GERD, HLD, hypothyroidism, TIA, persistent atrial fibrillation on Eliquis, T2DM, frequent UTIs and IBS   Significant Hospital Events   05/25/23: Admit to ICU with severe urosepsis with septic shock secondary to ureteral stone s/p stent placement   Consults:  PCCM Urology  Procedures:  01/31>right ureteroscopy and stent change   Significant Diagnostic Tests:  01/31: Chest Xray> IMPRESSION: 1. Stable left basilar consolidation and small left  effusion.  Interim History / Subjective:    -  Micro Data:  01/31: Blood culture x2> 01/31: Urine Culture> 01/31: MRSA PCR>>   Antimicrobials:  1/31 Meropenem>>  OBJECTIVE  Blood pressure (!) 85/58, pulse (!) 111, temperature 98.6 F (37 C), temperature source Rectal, resp. rate 20, height 5\' 3"  (1.6 m), weight 78.7 kg, SpO2 99%.      Intake/Output Summary (Last 24 hours) at 05/25/2023 2147 Last data filed at 05/25/2023 2100 Gross per 24 hour  Intake 4047.97 ml  Output 230 ml  Net 3817.97 ml   Filed Weights   05/25/23 1011 05/25/23 1950  Weight: 82.3 kg 78.7 kg     Physical Examination  GENERAL: 89 year-old critically ill patient lying in the bed in no acute distress EYES: PEERLA. No scleral icterus. Extraocular muscles intact.  HEENT: Head atraumatic, normocephalic. Oropharynx and nasopharynx clear.  NECK:  No JVD, supple  LUNGS:NBRS.  No use of accessory muscles of respiration.  CARDIOVASCULAR: S1, S2 normal. No murmurs, rubs, or gallops.  ABDOMEN: Soft, NTND, mid pubic EXTREMITIES: + pitting edema of bilateral lower extremities.  Capillary refill < 3 seconds in all extremities. Pulses palpable distally. NEUROLOGIC: The patient is altered . No focal neurological deficit appreciated. Cranial nerves are intact.  SKIN: No obvious rash, lesion, or ulcer. Warm to touch Labs/imaging that I havepersonally reviewed  (right click and "Reselect all SmartList Selections" daily)     Labs   CBC: Recent Labs  Lab 05/25/23 1629  WBC 8.4  NEUTROABS 7.8*  HGB 10.9*  HCT 32.3*  MCV 97.6  PLT 134*    Basic Metabolic Panel: Recent Labs  Lab 05/25/23 1629  NA 137  K 3.4*  CL 101  CO2 21*  GLUCOSE 118*  BUN 20  CREATININE 1.17*  CALCIUM 7.8*   GFR: Estimated Creatinine Clearance: 29.8 mL/min (A) (by C-G formula based on SCr of 1.17 mg/dL (H)). Recent Labs  Lab 05/25/23 1629 05/25/23 1958  WBC 8.4  --   LATICACIDVEN 5.9* 7.0*    Liver Function  Tests: Recent Labs  Lab 05/25/23 1629  AST 355*  ALT 48*  ALKPHOS 313*  BILITOT 2.3*  PROT 6.2*  ALBUMIN 2.2*   No results for input(s): "LIPASE", "AMYLASE" in the last 168 hours. No results for input(s): "AMMONIA" in the last 168 hours.  ABG    Component Value Date/Time   PHART 7.39 04/26/2023 2028   PCO2ART 24 (L) 04/26/2023 2028   PO2ART 73 (L) 04/26/2023 2028   HCO3 14.5 (L) 04/26/2023 2028   ACIDBASEDEF 8.6 (H) 04/26/2023 2028   O2SAT 97.8 04/26/2023 2028     Coagulation Profile: No results for input(s): "INR", "PROTIME" in the last 168 hours.  Cardiac Enzymes: No results for input(s): "CKTOTAL", "CKMB", "CKMBINDEX", "TROPONINI" in the last 168 hours.  HbA1C: Hgb A1c MFr Bld  Date/Time Value Ref Range Status  04/26/2023 05:06 AM 8.0 (H) 4.8 - 5.6 % Final    Comment:    (NOTE)         Prediabetes: 5.7 - 6.4         Diabetes: >6.4         Glycemic control for adults with diabetes: <7.0   10/10/2020 08:10 AM 7.8 (H) 4.8 - 5.6 % Final    Comment:    (NOTE)         Prediabetes: 5.7 - 6.4         Diabetes: >6.4         Glycemic control for adults with diabetes: <7.0     CBG: Recent Labs  Lab 05/25/23 1011 05/25/23 1342 05/25/23 1449 05/25/23 1912 05/25/23 1942  GLUCAP 129* 132* 139* 88 91    Review of Systems:   Unable to be obtained secondary to the patient's intubated and sedated status.   Past Medical History  She,  has a past medical history of (HFpEF) heart failure with preserved ejection fraction (HCC), Allergy, Anxiety, Aortic atherosclerosis (HCC), B12 deficiency, Balance disorder, Cardiomegaly, Colon polyps, Coronary artery disease, non-occlusive, DDD (degenerative disc disease), lumbar, DM2 (diabetes mellitus, type 2) (HCC), Dysphagia, Esophageal obstruction, Esophageal web, Falls frequently, GERD (gastroesophageal reflux disease), Granuloma annulare (2010), History of chicken pox, History of eating disorder, HLD (hyperlipidemia), HTN  (hypertension), Hypothyroidism, Incontinence of bowel, Insomnia, Irritable bowel syndrome with diarrhea, Lymphedema of both lower extremities, Nephrolithiasis, On apixaban therapy, Osteopenia, Osteoporosis, Ovarian cancer (HCC) (1980), Persistent atrial fibrillation (HCC) (2005), Poor memory, Pulmonary hypertension (HCC), SBO (small bowel obstruction) (HCC) (1998), Scoliosis, TIA (transient ischemic attack), Venous insufficiency, and Ventral hernia.   Surgical History    Past Surgical History:  Procedure Laterality Date   2nd look laparotomy  1982   ABDOMINAL HYSTERECTOMY     for ovarian cancer s/p hysterectomy total 1976 and exp lap 1980   APPENDECTOMY     CARDIAC CATHETERIZATION  11/2003   neg; a fib found    CARPAL TUNNEL RELEASE Left 03/06/2018   Procedure: CARPAL TUNNEL RELEASE ENDOSCOPIC;  Surgeon: Christena Flake, MD;  Location: [facility];  Service: Orthopedics;  Laterality: Left;  diabetic - oral meds   CARPAL TUNNEL RELEASE Left    Procedure: CARPAL TUNNEL RELEASE; Location: [facility]; Surgeon: Leron Croak, MD  CATARACT EXTRACTION Right 08/2005   cataract OD  2002   CHOLECYSTECTOMY  1986   CYSTOSCOPY WITH RETROGRADE PYELOGRAM, URETEROSCOPY AND STENT PLACEMENT Right 04/25/2023   Procedure: CYSTOSCOPY WITH RETROGRADE PYELOGRAM, URETEROSCOPY AND STENT PLACEMENT;  Surgeon: Jannifer Hick, MD;  Location: [facility];  Service: Urology;  Laterality: Right;   DEXA  9/04 and 1/02   EGD/dilation/colon  04/2004   ESOPHAGOGASTRODUODENOSCOPY (EGD) WITH PROPOFOL N/A 05/16/2016   Procedure: ESOPHAGOGASTRODUODENOSCOPY (EGD) WITH PROPOFOL with dilation;  Surgeon: Midge Minium, MD;  Location: [facility];  Service: Endoscopy;  Laterality: N/A;   FEMORAL HERNIA REPAIR Right 1958   fracture L elbow/wrist  2000   INTRAMEDULLARY (IM) NAIL INTERTROCHANTERIC Left 10/12/2020   Procedure: INTRAMEDULLARY (IM) NAIL INTERTROCHANTRIC;  Surgeon: Juanell Fairly, MD;  Location: [facility];  Service:  Orthopedics;  Laterality: Left;   intussception/obstruction  03/1997   kidney stone x2  1991   KYPHOPLASTY N/A 04/13/2020   Procedure: [id number];  Surgeon: Kennedy Bucker, MD;  Location: [facility];  Service: Orthopedics;  Laterality: N/A;   KYPHOPLASTY N/A 05/18/2020   Procedure: L1 KYPHOPLASTY;  Surgeon: Kennedy Bucker, MD;  Location: [facility];  Service: Orthopedics;  Laterality: N/A;   KYPHOPLASTY N/A 07/06/2020   Procedure: L4  KYPHOPLASTY;  Surgeon: Kennedy Bucker, MD;  Location: [facility];  Service: Orthopedics;  Laterality: N/A;   laminectomy L4-5  1971   LUMBAR LAMINECTOMY     1970 ruptured disc    MOUTH SURGERY     myoview stress  11/2003   syncope (-)   REVERSE SHOULDER ARTHROPLASTY Left 10/06/2014   Procedure: REVERSE SHOULDER ARTHROPLASTY;  Surgeon: Christena Flake, MD;  Location: [facility];  Service: Orthopedics;  Laterality: Left;   stillbirth  1969   TOTAL ABDOMINAL HYSTERECTOMY W/ BILATERAL SALPINGOOPHORECTOMY  1980   ovarian cancer   TOTAL SHOULDER ARTHROPLASTY Left    WRIST FRACTURE SURGERY Right 02/2004     Social History   reports that she has never smoked. She has never used smokeless tobacco. She reports that she does not drink alcohol and does not use drugs.   Family History   Her family history includes Arthritis in her brother, brother, and sister; COPD in her brother; Cancer in her brother, brother, and brother; Depression in her brother and sister; Diabetes in her brother, mother, and paternal grandfather; Early death in her brother, brother, and father; Hearing loss in her brother, brother, brother, daughter, sister, and sister; Heart disease in her brother, mother, sister, sister, and sister; Hyperlipidemia in her brother; Hypertension in her daughter and sister; Stroke in her sister.   Allergies Allergies  Allergen Reactions   Amiodarone Other (See Comments)    Severe Thyroid issues    Fosamax [Alendronate]     dysphagia   Penicillins Anaphylaxis and  Other (See Comments)    Tolerated 1st generation cephalosporin (CEFAZOLIN) 07/06/2020, 10/12/2020, 04/25/2023)   Sulfa Antibiotics Itching and Rash    Itching    Actos [Pioglitazone]     Not effective    Amaryl [Glimepiride]     Not effective     Atorvastatin Other (See Comments)    Muscle aches & All statins per Patient   Bentyl [Dicyclomine Hcl]     Could not tolerate nausea, reduced concentration, h/a    Ciprofloxacin Other (See Comments)    High blood pressure     Codeine Nausea And Vomiting   Ezetimibe-Simvastatin Other (See Comments)    Muscle aches, nausea, back pain    Lovastatin Other (  See Comments)    Myalgias   Macrobid [Nitrofurantoin Macrocrystal]     Severe Itching, rash    Protonix [Pantoprazole Sodium]     Esophageal problems  Wants removed from list   Rosiglitazone Maleate Other (See Comments)    Edema   Statins     Muscle and joint aches to all statins   Victoza [Liraglutide]     Nausea    Alendronate Sodium Rash    Dysphagia and ulceration    Bactrim [Sulfamethoxazole-Trimethoprim] Rash    itching     Home Medications  Prior to Admission medications   Medication Sig Start Date End Date Taking? Authorizing Provider  acetaminophen (TYLENOL) 500 MG tablet Take 1,000 mg by mouth every 6 (six) hours as needed for moderate pain (pain score 4-6).   Yes [provider]  alum & mag hydroxide-simeth (MAALOX/MYLANTA) 200-200-20 MG/5ML suspension Take 30 mLs by mouth every 4 (four) hours as needed for indigestion. 10/20/20  Yes Pennie Banter, DO  apixaban (ELIQUIS) 5 MG TABS tablet Take 5 mg by mouth 2 (two) times daily.   Yes [provider]  Ascorbic Acid (VITAMIN C) 1000 MG tablet Take 2,000 mg by mouth daily.   Yes [provider]  bisoprolol (ZEBETA) 5 MG tablet Take 5 mg by mouth every 12 (twelve) hours.   Yes [provider]  CALCIUM ANTACID EXTRA STRENGTH 750 MG chewable tablet Chew 2 tablets by mouth daily as  needed (indigestion). 08/22/21  Yes [provider]  Cyanocobalamin (VITAMIN B-12) 5000 MCG LOZG Take 5,000 mcg by mouth daily.   Yes [provider]  diclofenac Sodium (VOLTAREN) 1 % GEL Apply 2 g topically in the morning, at noon, and at bedtime.   Yes [provider]  docusate sodium (COLACE) 100 MG capsule Take 100 mg by mouth daily as needed for mild constipation.   Yes [provider]  estradiol (ESTRACE) 0.1 MG/GM vaginal cream APPLY 0.5MG  (PEA SIZED AMOUNT) JUST INSIDE THE VAGINA WITH FINGER-TIP ON MONDAY, WEDNESDAY AND FRIDAY NIGHTS. 07/21/20  Yes McGowan, Carollee Herter A, PA-C  famotidine (PEPCID) 20 MG tablet Take 20 mg by mouth at bedtime. 12/29/21  Yes [provider]  fluticasone (FLONASE) 50 MCG/ACT nasal spray Place 1 spray into both nostrils daily.   Yes [provider]  folic acid (FOLVITE) 1 MG tablet Take 1 tablet (1 mg total) by mouth daily. 10/21/20  Yes Esaw Grandchild A, DO  furosemide (LASIX) 20 MG tablet Take 1 tablet (20 mg total) by mouth daily. 05/07/23  Yes Marcelino Duster, MD  hydrOXYzine (ATARAX) 25 MG tablet Take 25 mg by mouth every 8 (eight) hours as needed for anxiety.   Yes [provider]  insulin aspart (NOVOLOG) 100 UNIT/ML injection Inject 0-15 Units into the skin 4 (four) times daily -  before meals and at bedtime. Patient taking differently: Inject 0-12 Units into the skin 2 (two) times daily. SS:200-249=2u, 250-299=4u, 300-349=6u, 350-399=8u, 400-449=10u, 450-499=12u, if 500 or greater= CALL MD 10/20/20  Yes Esaw Grandchild A, DO  insulin glargine-yfgn (SEMGLEE, YFGN,) 100 UNIT/ML injection Inject 8 Units into the skin at bedtime.   Yes [provider]  levothyroxine (SYNTHROID, LEVOTHROID) 50 MCG tablet Take 50 mcg by mouth daily before breakfast.   Yes [provider]  Magnesium Oxide 250 MG TABS Take 250 mg by mouth daily.   Yes [provider]  melatonin 5 MG TABS Take 1  tablet (5 mg total) by mouth at bedtime as  needed. 05/07/23  Yes Marcelino Duster, MD  Multiple Vitamins-Iron (DAILY MULTIVITAMINS/IRON PO) Take 1 tablet by mouth daily.   Yes [provider]  Multiple Vitamins-Minerals (HEALTHY EYES SUPERVISION 2) CAPS Take 1 capsule by mouth 2 (two) times daily.   Yes [provider]  nitrofurantoin (MACRODANTIN) 100 MG capsule Take 1 capsule (100 mg total) by mouth daily for 5 days. 05/25/23 05/30/23 Yes Sondra Come, MD  nystatin powder Apply 1 Application topically every 8 (eight) hours as needed (under the breasts for rash).   Yes [provider]  Olopatadine HCl 0.2 % SOLN Place 1 drop into both eyes daily as needed (Allergies).   Yes [provider]  Omega-3 Fatty Acids (SUPER OMEGA 3 EPA/DHA) 1000 MG CAPS Take 1,000 mg by mouth in the morning and at bedtime.   Yes [provider]  ondansetron (ZOFRAN) 4 MG tablet Take 1 tablet (4 mg total) by mouth every 6 (six) hours as needed for nausea. 10/20/20  Yes Esaw Grandchild A, DO  pyridOXINE (B-6) 50 MG tablet Take 1 tablet (50 mg total) by mouth daily. 10/21/20  Yes Pennie Banter, DO  simethicone (MYLICON) 80 MG chewable tablet Chew 80 mg by mouth every 6 (six) hours as needed for flatulence.   Yes [provider]  TRADJENTA 5 MG TABS tablet Take 5 mg by mouth daily. 10/05/20  Yes [provider]  glucose blood (ONE TOUCH ULTRA TEST) test strip Use to test blood sugar once daily E11.49 06/24/15   Bedsole, Amy E, MD  magnesium hydroxide (MILK OF MAGNESIA) 400 MG/5ML suspension Take 30 mLs by mouth daily as needed for mild constipation. 10/20/20   Pennie Banter, DO  Nystatin (GERHARDT'S BUTT CREAM) CREA Apply 1 Application topically 2 (two) times daily. 05/07/23   Marcelino Duster, MD  Skin Protectants, Misc. (DIMETHICONE-ZINC OXIDE) cream Apply topically 2 (two) times daily as needed for dry skin. Please label the bottle for the vaginal area  only. Patient not taking: Reported on 04/25/2023 06/29/20   Michiel Cowboy A, PA-C  Scheduled Meds:  Chlorhexidine Gluconate Cloth  6 each Topical Daily   insulin aspart  0-6 Units Subcutaneous TID WC   sodium chloride flush  3 mL Intravenous Q12H   Continuous Infusions:  sodium chloride Stopped (05/25/23 2206)   meropenem (MERREM) IV Stopped (05/25/23 1745)   norepinephrine (LEVOPHED) Adult infusion 23 mcg/min (05/26/23 0200)   vasopressin 0.03 Units/min (05/26/23 0200)   PRN Meds:.acetaminophen **OR** acetaminophen, ondansetron **OR** ondansetron (ZOFRAN) IV, polyethylene glycol  Active Hospital Problem list   See systems below  Assessment & Plan:  #Septic Shock ~ RESOLVED  #Paroxymal Atrial Fibrillation Echocardiogram 04/27/23: LVEF 50-55%, indeterminate diastolic parameters, RV systolic function mildly reduced, moderately elevated pulmonary artery systolic pressure, small pericardial effusion is present (no tamponade), moderated TR -Continuous cardiac monitoring -Maintain MAP >65 -Vasopressors as needed to maintain MAP goal  -Hold home Eliquis and anticoagulation due to hematuria post stent placement   #Severe Sepsis  #Right Ureteral Stone infection ~ s/p Right Ureteral Stent on 1/1 and stent exchange on 01/31 Recent #ESBL E. Coli BACTEREMIA on 01/25 -Monitor fever curve -Trend WBC's & Procalcitonin -Follow cultures as above -Continue empiric Meropenem pending cultures & sensitivities -Urology following, appreciate input   #Acute Kidney Injury  #NAGMA with Lactic Acidosis #Mild Hypokalemia  #Mild Hyponatremia  -Monitor I&O's / urinary output -Follow BMP -Ensure adequate renal perfusion -Avoid nephrotoxic agents as able -Replace electrolytes as indicated ~ Pharmacy following for assistance  with electrolyte replacement -IV fluids   #Thrombocytopenia likely in the setting of severe sepsis -Check DIC panel -Monitor for S/Sx of bleeding -Trend CBC -Lovenox for VTE  Prophylaxis  -Transfuse for Hgb <7 -Transfuse Platelets for Platelet count <10 K, <50 K with active bleeding  #Transaminitis  suspect secondary to shock versus hepatic congestion. Chronically elevated alk phos elevation similar to presentation.  -Follow LFTs   #Diabetes Mellitus Type II #Hypothyroidism -CBG's q4h; Target range of 140 to 180 -SSI -Follow ICU Hypo/Hyperglycemia protocol -Continue Synthroid   #Acute Metabolic Encephalopathy  -Treatment of sepsis and metabolic derangements as outlined above -Provide supportive care -Promote normal sleep/wake cycle and family presence -Avoid sedating medications as able    Best practice:  Diet:  Oral Pain/Anxiety/Delirium protocol (if indicated): No VAP protocol (if indicated): Not indicated DVT prophylaxis: Contraindicated GI prophylaxis: N/A Glucose control:  SSI Yes Central venous access:  Yes, and it is still needed Arterial line:  N/A Foley:  Yes, and it is still needed Mobility:  bed rest  PT consulted: N/A Last date of multidisciplinary goals of care discussion [updated son at the bedside] Code Status:  DNR Disposition: ICU   = Goals of Care = Code Status Order: DNR/DNI  Primary Emergency Contact: Weightman,Tim, Home Phone: [phone] Patient wishes to pursue ongoing treatment, but concurred that if deteriorated to pulselessness, patient would prefer a natural death as opposed to invasive measures such as CPR and intubation.    Critical care time: 55 minutes        Webb Silversmith DNP, CCRN, FNP-C, AGACNP-BC Acute Care & Family Nurse Practitioner [facility] PCCM on call pager [phone]

## 2023-05-25 NOTE — Progress Notes (Signed)
Pharmacy Antibiotic Note  Jodi Reeves is a 89 y.o. female admitted on 05/25/2023 with UTI.  Patient is POD 0 from right ureteroscopy and stent change. Preop urine culture grew 75k mixed flora and patient was treated with nitrofurantoin. Patient now with confusion and fever, MD suspecting it is from UTI/urosepsis after ureteroscopy. Meropenem was recommended based on her prior culture data from 04/25/23, which grew ESBL E. coli. Pharmacy has been consulted for meropenem dosing.  Plan: Start meropenem 1 g IV q12h  Continue to monitor renal function, culture data, and LOT  Height: 5\' 3"  (160 cm) Weight: 82.3 kg (181 lb 7 oz) IBW/kg (Calculated) : 52.4  Temp (24hrs), Avg:99.7 F (37.6 C), Min:97.9 F (36.6 C), Max:101.1 F (38.4 C)  No results for input(s): "WBC", "CREATININE", "LATICACIDVEN", "VANCOTROUGH", "VANCOPEAK", "VANCORANDOM", "GENTTROUGH", "GENTPEAK", "GENTRANDOM", "TOBRATROUGH", "TOBRAPEAK", "TOBRARND", "AMIKACINPEAK", "AMIKACINTROU", "AMIKACIN" in the last 168 hours.  Estimated Creatinine Clearance: 44.7 mL/min (by C-G formula based on SCr of 0.58 mg/dL).    Allergies  Allergen Reactions   Amiodarone Other (See Comments)    Severe Thyroid issues    Fosamax [Alendronate]     dysphagia   Penicillins Anaphylaxis and Other (See Comments)    Tolerated 1st generation cephalosporin (CEFAZOLIN) 07/06/2020, 10/12/2020, 04/25/2023)   Sulfa Antibiotics Itching and Rash    Itching    Actos [Pioglitazone]     Not effective    Amaryl [Glimepiride]     Not effective     Atorvastatin Other (See Comments)    Muscle aches & All statins per Patient   Bentyl [Dicyclomine Hcl]     Could not tolerate nausea, reduced concentration, h/a    Ciprofloxacin Other (See Comments)    High blood pressure     Codeine Nausea And Vomiting   Ezetimibe-Simvastatin Other (See Comments)    Muscle aches, nausea, back pain    Lovastatin Other (See Comments)    Myalgias   Macrobid [Nitrofurantoin  Macrocrystal]     Severe Itching, rash    Protonix [Pantoprazole Sodium]     Esophageal problems  Wants removed from list   Rosiglitazone Maleate Other (See Comments)    Edema   Statins     Muscle and joint aches to all statins   Victoza [Liraglutide]     Nausea    Alendronate Sodium Rash    Dysphagia and ulceration    Bactrim [Sulfamethoxazole-Trimethoprim] Rash    itching    Antimicrobials this admission: 1/31 Cipro IV x 1 1/31 meropenem >>    Microbiology results: 1/1 BCx: ESBL E. coli 1/6 Bcx: NGTD 1/1 UCx: ESBL E. coli    Thank you for allowing pharmacy to be a part of this patient's care.  Merryl Hacker, PharmD Clinical Pharmacist 05/25/2023 4:19 PM

## 2023-05-25 NOTE — Progress Notes (Signed)
Informed Dr Huel Cote of lactic acid of 5.9 and updated BP. Continuing to provide patient with IVF. Asked pharmacy to send antibiotics.

## 2023-05-25 NOTE — Progress Notes (Signed)
Patient presented from PACU to post op. When transferring patient from stretcher to w/c patient began having bowel movement and would not hold her head up. Eyes remained open and patient would respond, but would not speak other than mumbling. Patient verbal prior to surgery and in PACU. Notified Dr. Randa Ngo who came to bedside. Order placed for hospitalist consult. Son is at bedside and states patient is far from her baseline.

## 2023-05-25 NOTE — Anesthesia Procedure Notes (Signed)
Procedure Name: Intubation Date/Time: 05/25/2023 11:58 AM  Performed by: Mathews Argyle, CRNAPre-anesthesia Checklist: Patient identified, Emergency Drugs available, Suction available and Patient being monitored Patient Re-evaluated:Patient Re-evaluated prior to induction Oxygen Delivery Method: Circle System Utilized Preoxygenation: Pre-oxygenation with 100% oxygen Induction Type: IV induction Ventilation: Mask ventilation without difficulty Laryngoscope Size: McGrath and 4 Grade View: Grade I Tube type: Oral Number of attempts: 1 Airway Equipment and Method: Stylet and Oral airway Placement Confirmation: ETT inserted through vocal cords under direct vision, positive ETCO2 and breath sounds checked- equal and bilateral Secured at: 21 cm Tube secured with: Tape Dental Injury: Teeth and Oropharynx as per pre-operative assessment

## 2023-05-25 NOTE — Consult Note (Signed)
PHARMACY CONSULT NOTE - FOLLOW UP  Pharmacy Consult for Electrolyte Monitoring and Replacement   Recent Labs: Potassium (mmol/L)  Date Value  05/25/2023 3.4 (L)   Magnesium (mg/dL)  Date Value  16/01/9603 1.8   Calcium (mg/dL)  Date Value  54/12/8117 7.8 (L)   Albumin (g/dL)  Date Value  14/78/2956 2.2 (L)  09/26/2019 4.3   Phosphorus (mg/dL)  Date Value  21/30/8657 2.6   Sodium (mmol/L)  Date Value  05/25/2023 137  09/14/2020 139     Assessment: Patient was admitted for urologic procedure. Pharmacy consulted for electrolytes.   Goal of Therapy:  WNL  Plan:  KCL 20 mEq x 1.   Ronnald Ramp ,PharmD Clinical Pharmacist 05/25/2023 6:28 PM

## 2023-05-25 NOTE — Assessment & Plan Note (Signed)
Increasing LFTs consistent with suspected shock liver given profound hypotension.  - Continue to trend LFTs

## 2023-05-26 ENCOUNTER — Inpatient Hospital Stay: Payer: Medicare HMO

## 2023-05-26 LAB — BLOOD CULTURE ID PANEL (REFLEXED) - BCID2

## 2023-05-26 LAB — BASIC METABOLIC PANEL
Anion gap: 14 (ref 5–15)
BUN: 20 mg/dL (ref 8–23)
CO2: 19 mmol/L — ABNORMAL LOW (ref 22–32)
Calcium: 7.3 mg/dL — ABNORMAL LOW (ref 8.9–10.3)
Chloride: 102 mmol/L (ref 98–111)
Creatinine, Ser: 1.46 mg/dL — ABNORMAL HIGH (ref 0.44–1.00)
GFR, Estimated: 33 mL/min — ABNORMAL LOW (ref >60)
Glucose, Bld: 178 mg/dL — ABNORMAL HIGH (ref 70–99)
Potassium: 3.7 mmol/L (ref 3.5–5.1)
Sodium: 135 mmol/L (ref 135–145)

## 2023-05-26 LAB — COMPREHENSIVE METABOLIC PANEL
ALT: 47 U/L — ABNORMAL HIGH (ref 0–44)
AST: 253 U/L — ABNORMAL HIGH (ref 15–41)
Albumin: 1.9 g/dL — ABNORMAL LOW (ref 3.5–5.0)
Alkaline Phosphatase: 242 U/L — ABNORMAL HIGH (ref 38–126)
Anion gap: 14 (ref 5–15)
BUN: 21 mg/dL (ref 8–23)
CO2: 18 mmol/L — ABNORMAL LOW (ref 22–32)
Calcium: 7.2 mg/dL — ABNORMAL LOW (ref 8.9–10.3)
Chloride: 102 mmol/L (ref 98–111)
Creatinine, Ser: 1.6 mg/dL — ABNORMAL HIGH (ref 0.44–1.00)
GFR, Estimated: 30 mL/min — ABNORMAL LOW (ref >60)
Glucose, Bld: 209 mg/dL — ABNORMAL HIGH (ref 70–99)
Potassium: 3.8 mmol/L (ref 3.5–5.1)
Sodium: 134 mmol/L — ABNORMAL LOW (ref 135–145)
Total Bilirubin: 2.7 mg/dL — ABNORMAL HIGH (ref 0.0–1.2)
Total Protein: 5.4 g/dL — ABNORMAL LOW (ref 6.5–8.1)

## 2023-05-26 LAB — APTT: aPTT: 41 s — ABNORMAL HIGH (ref 24–36)

## 2023-05-26 LAB — CBC WITH DIFFERENTIAL/PLATELET
Basophils Relative: 0.2 10*3/uL — ABNORMAL HIGH (ref 0.0–0.1)
Eosinophils Absolute: 0 10*3/uL (ref 0.0–0.5)
Eosinophils Relative: 0 10*3/uL (ref 0.0–0.5)
HCT: 31.3 % — ABNORMAL LOW (ref 36.0–46.0)
Hemoglobin: 10.5 g/dL — ABNORMAL LOW (ref 12.0–15.0)
Lymphocytes Relative: 1 %
Lymphs Abs: 0.3 10*3/uL — ABNORMAL LOW (ref 0.7–4.0)
MCH: 33.2 pg (ref 26.0–34.0)
MCHC: 33.5 g/dL (ref 30.0–36.0)
MCV: 99.1 fL (ref 80.0–100.0)
Monocytes Absolute: 0 10*3/uL (ref 0.1–1.0)
Monocytes Relative: 0.6 10*3/uL (ref 0.1–1.0)
Monocytes Relative: 1 10*3/uL (ref 0.7–4.0)
Neutro Abs: 44.2 10*3/uL — ABNORMAL HIGH (ref 1.7–7.7)
Neutrophils Relative %: 93 %
Other: 2.15 10*3/uL — ABNORMAL HIGH (ref 0.00–0.07)
Platelets: 140 10*3/uL — ABNORMAL LOW (ref 150–400)
RBC: 3.16 MIL/uL — ABNORMAL LOW (ref 3.87–5.11)
RDW: 13.9 % (ref 11.5–15.5)
Smear Review: 5 %
WBC: 47.4 10*3/uL — ABNORMAL HIGH (ref 4.0–10.5)
nRBC: 0.1 % (ref 0.0–0.2)

## 2023-05-26 LAB — GLUCOSE, CAPILLARY
Glucose-Capillary: 181 mg/dL — ABNORMAL HIGH (ref 70–99)
Glucose-Capillary: 166 mg/dL — ABNORMAL HIGH (ref 70–99)
Glucose-Capillary: 161 mg/dL — ABNORMAL HIGH (ref 70–99)
Glucose-Capillary: 139 mg/dL — ABNORMAL HIGH (ref 70–99)

## 2023-05-26 LAB — D-DIMER, QUANTITATIVE: D-Dimer, Quant: 7.48 ug{FEU}/mL — ABNORMAL HIGH (ref 0.00–0.50)

## 2023-05-26 LAB — CBC
HCT: 33.5 % — ABNORMAL LOW (ref 36.0–46.0)
Hemoglobin: 11.1 g/dL — ABNORMAL LOW (ref 12.0–15.0)
MCH: 33.5 pg (ref 26.0–34.0)
MCHC: 33.1 g/dL (ref 30.0–36.0)
MCV: 101.2 fL — ABNORMAL HIGH (ref 80.0–100.0)
Platelets: 135 10*3/uL — ABNORMAL LOW (ref 150–400)
RBC: 3.31 MIL/uL — ABNORMAL LOW (ref 3.87–5.11)
RDW: 14.2 % (ref 11.5–15.5)
WBC: 60.9 10*3/uL (ref 4.0–10.5)
nRBC: 0 % (ref 0.0–0.2)

## 2023-05-26 LAB — PROCALCITONIN: Procalcitonin: 122.61 ng/mL

## 2023-05-26 LAB — PROTIME-INR
INR: 3.9 — ABNORMAL HIGH (ref 0.8–1.2)
Prothrombin Time: 38.4 s — ABNORMAL HIGH (ref 11.4–15.2)

## 2023-05-26 LAB — LACTIC ACID, PLASMA
Lactic Acid, Venous: 6.4 mmol/L (ref 0.5–1.9)
Lactic Acid, Venous: 6.5 mmol/L (ref 0.5–1.9)

## 2023-05-26 LAB — FIBRINOGEN: Fibrinogen: 367 mg/dL (ref 210–475)

## 2023-05-26 MED ORDER — MIDAZOLAM HCL 2 MG/2ML IJ SOLN
INTRAMUSCULAR | Status: AC
  Filled 2023-05-26: qty 2

## 2023-05-26 MED ORDER — LACTATED RINGERS IV BOLUS
1000.0000 mL | Freq: Once | INTRAVENOUS | Status: AC
Start: 2023-05-26 — End: 2023-05-26
  Administered 2023-05-26: 1000 mL via INTRAVENOUS

## 2023-05-26 MED ORDER — NOREPINEPHRINE 16 MG/250ML-% IV SOLN
0.0000 ug/min | INTRAVENOUS | Status: DC
  Administered 2023-05-26: 23 ug/min via INTRAVENOUS
  Administered 2023-05-26: 24 ug/min via INTRAVENOUS
  Administered 2023-05-28: 13 ug/min via INTRAVENOUS
  Filled 2023-05-26 (×6): qty 250

## 2023-05-26 MED ORDER — SODIUM CHLORIDE 0.9 % IV SOLN
2.0000 g | INTRAVENOUS | Status: DC
  Administered 2023-05-26 – 2023-05-28 (×3): 2 g via INTRAVENOUS
  Filled 2023-05-26 (×3): qty 20

## 2023-05-26 NOTE — Procedures (Signed)
Central Venous Catheter Placement:  TRIPLE LUMEN     Procedure: Insertion of Non-tunneled Central Venous [phone]) with US guidance ([id number])    Indication(s) Medication administration and Difficult access  Vasopressor requirement at high dose Patient receiving vesicant or irritant drug.; Patient receiving intravenous therapy for longer than 5 days.; Patient has limited or no vascular access.      Patient is receiving 2 vasopressors for septic shock.  She is with inadvertantly pulled out right Central venous catheter and was remorseful. She requires central access due to ongoing therapy with levophed and vasopressor infusion.   Consent Risks of the procedure as well as the alternatives and risks of each were explained to the patient and/or caregiver.  Consent for the procedure was obtained and is signed in the bedside chart   Consent-patient gave verbal consent at bedside and understands emergent situation.         Timeout Verified patient identification, verified procedure, site/side was marked, verified correct patient position, special equipment/implants available, medications/allergies/relevant history reviewed, required imaging and test results available. Patient comfort was obtained.     Sterile Technique Maximal sterile technique including full sterile barrier drape, hand hygiene, sterile gown, sterile gloves, mask, hair covering, sterile ultrasound probe cover (if used).   Hand washing performed prior to starting the procedure.      Procedure Description Area of catheter insertion was cleaned with chlorhexidine and draped in sterile fashion.  With real-time ultrasound guidance a central venous catheter was placed into the right internal jugular vein. Nonpulsatile blood flow and easy flushing noted in all ports.  The catheter was sutured in place and sterile dressing applied.   A triple lumen catheter was placed in Right Vein There was good blood return, catheter caps  were placed on lumens, catheter flushed easily, the line was secured and a sterile dressing and BIO-PATCH applied.      Complications/Tolerance None; patient tolerated the procedure well. Chest X-ray is ordered to verify placement     EBL Minimal   Specimen(s) None     Number of Attempts: 1 Complications:none Estimated Blood Loss: none    Vida Rigger, M.D.  Pulmonary & Critical Care Medicine  [facility]

## 2023-05-26 NOTE — Plan of Care (Signed)
   Problem: Fluid Volume: Goal: Hemodynamic stability will improve Outcome: Progressing   Problem: Clinical Measurements: Goal: Diagnostic test results will improve Outcome: Progressing Goal: Signs and symptoms of infection will decrease Outcome: Progressing   Problem: Education: Goal: Knowledge of General Education information will improve Description: Including pain rating scale, medication(s)/side effects and non-pharmacologic comfort measures Outcome: Progressing

## 2023-05-26 NOTE — Progress Notes (Signed)
PHARMACY - PHYSICIAN COMMUNICATION CRITICAL VALUE ALERT - BLOOD CULTURE IDENTIFICATION (BCID)  Jodi Reeves is an 89 y.o. female who presented to [facility] on 05/25/2023 with a chief complaint of septic shock. ESBL E Coli in blood cx on 1/25  Assessment:  Proteus species growing in 1 of 4 bottles, no resistance detected.   (include suspected source if known)  Name of physician (or Provider) Contacted: Webb Silversmith, NP   Current antibiotics: meropenem 1 gm IV Q12H   Changes to prescribed antibiotics recommended:  Suggested possibly de-escalating to Ceftriaxone 2 gm IV Q24H.  - NP wants to continue meropenem b/c pt is still very sick.   Results for orders placed or performed during the hospital encounter of 05/25/23  Blood Culture ID Panel (Reflexed) (Collected: 05/25/2023  4:29 PM)  Result Value Ref Range   Enterococcus faecalis NOT DETECTED NOT DETECTED   Enterococcus Faecium NOT DETECTED NOT DETECTED   Listeria monocytogenes NOT DETECTED NOT DETECTED   Staphylococcus species NOT DETECTED NOT DETECTED   Staphylococcus aureus (BCID) NOT DETECTED NOT DETECTED   Staphylococcus epidermidis NOT DETECTED NOT DETECTED   Staphylococcus lugdunensis NOT DETECTED NOT DETECTED   Streptococcus species NOT DETECTED NOT DETECTED   Streptococcus agalactiae NOT DETECTED NOT DETECTED   Streptococcus pneumoniae NOT DETECTED NOT DETECTED   Streptococcus pyogenes NOT DETECTED NOT DETECTED   A.calcoaceticus-baumannii NOT DETECTED NOT DETECTED   Bacteroides fragilis NOT DETECTED NOT DETECTED   Enterobacterales DETECTED (A) NOT DETECTED   Enterobacter cloacae complex NOT DETECTED NOT DETECTED   Escherichia coli NOT DETECTED NOT DETECTED   Klebsiella aerogenes NOT DETECTED NOT DETECTED   Klebsiella oxytoca NOT DETECTED NOT DETECTED   Klebsiella pneumoniae NOT DETECTED NOT DETECTED   Proteus species DETECTED (A) NOT DETECTED   Salmonella species NOT DETECTED NOT DETECTED   Serratia marcescens NOT  DETECTED NOT DETECTED   Haemophilus influenzae NOT DETECTED NOT DETECTED   Neisseria meningitidis NOT DETECTED NOT DETECTED   Pseudomonas aeruginosa NOT DETECTED NOT DETECTED   Stenotrophomonas maltophilia NOT DETECTED NOT DETECTED   Candida albicans NOT DETECTED NOT DETECTED   Candida auris NOT DETECTED NOT DETECTED   Candida glabrata NOT DETECTED NOT DETECTED   Candida krusei NOT DETECTED NOT DETECTED   Candida parapsilosis NOT DETECTED NOT DETECTED   Candida tropicalis NOT DETECTED NOT DETECTED   Cryptococcus neoformans/gattii NOT DETECTED NOT DETECTED   CTX-M ESBL NOT DETECTED NOT DETECTED   Carbapenem resistance IMP NOT DETECTED NOT DETECTED   Carbapenem resistance KPC NOT DETECTED NOT DETECTED   Carbapenem resistance NDM NOT DETECTED NOT DETECTED   Carbapenem resist OXA 48 LIKE NOT DETECTED NOT DETECTED   Carbapenem resistance VIM NOT DETECTED NOT DETECTED    Chandrika Sandles D 05/26/2023  6:20 AM

## 2023-05-26 NOTE — Consult Note (Signed)
PHARMACY CONSULT NOTE - FOLLOW UP  Pharmacy Consult for Electrolyte Monitoring and Replacement   Recent Labs: Potassium (mmol/L)  Date Value  05/26/2023 3.8   Magnesium (mg/dL)  Date Value  40/98/1191 1.8   Calcium (mg/dL)  Date Value  47/82/9562 7.2 (L)   Albumin (g/dL)  Date Value  13/11/6576 1.9 (L)  09/26/2019 4.3   Phosphorus (mg/dL)  Date Value  46/96/2952 2.6   Sodium (mmol/L)  Date Value  05/26/2023 134 (L)  09/14/2020 139     Assessment: Patient was admitted for urologic procedure. Pharmacy consulted for electrolytes.   Goal of Therapy:  WNL  Plan:  KCL 20 mEq x 1.  F/u with AM labs.   Ronnald Ramp ,PharmD Clinical Pharmacist 05/26/2023 11:58 AM

## 2023-05-26 NOTE — Plan of Care (Signed)
  Problem: Respiratory: Goal: Ability to maintain adequate ventilation will improve Outcome: Progressing   Problem: Pain Managment: Goal: General experience of comfort will improve and/or be controlled Outcome: Progressing   Problem: Safety: Goal: Ability to remain free from injury will improve Outcome: Progressing   Problem: Skin Integrity: Goal: Risk for impaired skin integrity will decrease Outcome: Progressing

## 2023-05-26 NOTE — Progress Notes (Signed)
NAME:  Jodi Reeves, MRN:  [id number], DOB:  [birth year], LOS: 1 ADMISSION DATE:  05/25/2023, CONSULTATION DATE:  05/25/23 REFERRING MD:  Verdene Lennert, REASON FOR CONSULT:  Septic Shock   BRIEF HPI  89 y.o with significant PMH of CAD, d CHF, HTN, GERD, HLD, hypothyroidism, TIA, persistent atrial fibrillation on Eliquis, T2DM, frequent UTIs and IBS who presented to the to the hospital on 05/25/2023 for elective definitive ureteroscopy.  Patient was recently admitted to the hospital from 01/25-01/13 with Acute Metabolic Encephalopathy in setting of Severe Sepsis due to  ESBL E. Coli BACTEREMIA due to Right Ureteral Stone infection, status post Cystoscopy and right ureteral stent placement complicated by shock.    Hospital Course: Patient underwent ureteroscopy and stent exchange. She was transferred to  PACU and hospitalist consulted for admission.  Initial vital signs showed HR of 108 beats/minute, BP mm Hg, the RR 15 breaths/minute, and the oxygen saturation 77% on HFNC and a temperature of 98.67F (37C).  Pertinent Labs/Diagnostics Findings: Na+/ K+: 135/3.7 Glucose: 178 BUN/Cr.:20/1.46 AST/ALT:355/48 WBC: 47.4 K/L Hgb/Hct:10.5/31.3 Plts: 140 PCT: 122.61  Lactic acid: 5.9~7.0~5.6 troponin: 43  BNP: 256.8  CXR>see result below Patient given 30 cc/kg of fluids and started on broad-spectrum for urosepsis with shock. Patient remained hypotensive despite IVF boluses therefore was started on Levophed.   Past Medical History  CAD, d CHF, HTN, GERD, HLD, hypothyroidism, TIA, persistent atrial fibrillation on Eliquis, T2DM, frequent UTIs and IBS   Significant Hospital Events   05/25/23: Admit to ICU with severe urosepsis with septic shock secondary to ureteral stone s/p stent placement  05/26/23- patient ripped out her central line while moving around.  This was discussed with family and replaced due to need for high dose levophed and infusion of vasopressin.  She is clinically improved today  and more awake more alert.  I met with son Jorja Loa and granddaughter at bedside we reviewed bloodwork and imaging.    Consults:  PCCM Urology  Procedures:  01/31>right ureteroscopy and stent change   Significant Diagnostic Tests:  01/31: Chest Xray> IMPRESSION: 1. Stable left basilar consolidation and small left effusion.  Interim History / Subjective:    -  Micro Data:  01/31: Blood culture x2> 01/31: Urine Culture> 01/31: MRSA PCR>>   Antimicrobials:  1/31 Meropenem>>  OBJECTIVE  Blood pressure (!) 72/43, pulse 80, temperature (!) 97.5 F (36.4 C), temperature source Axillary, resp. rate (!) 23, height 5\' 3"  (1.6 m), weight 78.7 kg, SpO2 92%.      Intake/Output Summary (Last 24 hours) at 05/26/2023 1046 Last data filed at 05/26/2023 0700 Gross per 24 hour  Intake 5613.46 ml  Output 275 ml  Net 5338.46 ml   Filed Weights   05/25/23 1011 05/25/23 1950  Weight: 82.3 kg 78.7 kg     Physical Examination  GENERAL: 89 year-old critically ill patient lying in the bed in no acute distress EYES: PEERLA. No scleral icterus. Extraocular muscles intact.  HEENT: Head atraumatic, normocephalic. Oropharynx and nasopharynx clear.  NECK:  No JVD, supple  LUNGS:NBRS.  No use of accessory muscles of respiration.  CARDIOVASCULAR: S1, S2 normal. No murmurs, rubs, or gallops.  ABDOMEN: Soft, NTND, mid pubic EXTREMITIES: + pitting edema of bilateral lower extremities.  Capillary refill < 3 seconds in all extremities. Pulses palpable distally. NEUROLOGIC: no FND grossly SKIN: No obvious rash, lesion, or ulcer. Warm to touch Labs/imaging that I havepersonally reviewed  (right click and "Reselect all SmartList Selections" daily)  Labs   CBC: Recent Labs  Lab 05/25/23 1629 05/25/23 2348 05/26/23 0505  WBC 8.4 47.4* 60.9*  NEUTROABS 7.8* 44.2*  --   HGB 10.9* 10.5* 11.1*  HCT 32.3* 31.3* 33.5*  MCV 97.6 99.1 101.2*  PLT 134* 140* 135*    Basic Metabolic Panel: Recent Labs   Lab 05/25/23 1629 05/25/23 2348 05/26/23 0505  NA 137 135 134*  K 3.4* 3.7 3.8  CL 101 102 102  CO2 21* 19* 18*  GLUCOSE 118* 178* 209*  BUN 20 20 21   CREATININE 1.17* 1.46* 1.60*  CALCIUM 7.8* 7.3* 7.2*   GFR: Estimated Creatinine Clearance: 21.8 mL/min (A) (by C-G formula based on SCr of 1.6 mg/dL (H)). Recent Labs  Lab 05/25/23 1629 05/25/23 1958 05/25/23 2253 05/25/23 2348 05/26/23 0229 05/26/23 0505  PROCALCITON  --  122.61  --   --   --   --   WBC 8.4  --   --  47.4*  --  60.9*  LATICACIDVEN 5.9* 7.0* 5.6*  --  6.4* 6.5*    Liver Function Tests: Recent Labs  Lab 05/25/23 1629 05/26/23 0505  AST 355* 253*  ALT 48* 47*  ALKPHOS 313* 242*  BILITOT 2.3* 2.7*  PROT 6.2* 5.4*  ALBUMIN 2.2* 1.9*   No results for input(s): "LIPASE", "AMYLASE" in the last 168 hours. No results for input(s): "AMMONIA" in the last 168 hours.  ABG    Component Value Date/Time   PHART 7.39 04/26/2023 2028   PCO2ART 24 (L) 04/26/2023 2028   PO2ART 73 (L) 04/26/2023 2028   HCO3 14.5 (L) 04/26/2023 2028   ACIDBASEDEF 8.6 (H) 04/26/2023 2028   O2SAT 97.8 04/26/2023 2028     Coagulation Profile: Recent Labs  Lab 05/26/23 0246  INR 3.9*    Cardiac Enzymes: No results for input(s): "CKTOTAL", "CKMB", "CKMBINDEX", "TROPONINI" in the last 168 hours.  HbA1C: Hgb A1c MFr Bld  Date/Time Value Ref Range Status  04/26/2023 05:06 AM 8.0 (H) 4.8 - 5.6 % Final    Comment:    (NOTE)         Prediabetes: 5.7 - 6.4         Diabetes: >6.4         Glycemic control for adults with diabetes: <7.0   10/10/2020 08:10 AM 7.8 (H) 4.8 - 5.6 % Final    Comment:    (NOTE)         Prediabetes: 5.7 - 6.4         Diabetes: >6.4         Glycemic control for adults with diabetes: <7.0     CBG: Recent Labs  Lab 05/25/23 1342 05/25/23 1449 05/25/23 1912 05/25/23 1942 05/26/23 0503  GLUCAP 132* 139* 88 91 181*    Review of Systems:   Unable to be obtained secondary to the patient's  intubated and sedated status.   Past Medical History  She,  has a past medical history of (HFpEF) heart failure with preserved ejection fraction (HCC), Allergy, Anxiety, Aortic atherosclerosis (HCC), B12 deficiency, Balance disorder, Cardiomegaly, Colon polyps, Coronary artery disease, non-occlusive, DDD (degenerative disc disease), lumbar, DM2 (diabetes mellitus, type 2) (HCC), Dysphagia, Esophageal obstruction, Esophageal web, Falls frequently, GERD (gastroesophageal reflux disease), Granuloma annulare (2010), History of chicken pox, History of eating disorder, HLD (hyperlipidemia), HTN (hypertension), Hypothyroidism, Incontinence of bowel, Insomnia, Irritable bowel syndrome with diarrhea, Lymphedema of both lower extremities, Nephrolithiasis, On apixaban therapy, Osteopenia, Osteoporosis, Ovarian cancer (HCC) (1980), Persistent atrial fibrillation (HCC) (2005), Poor  memory, Pulmonary hypertension (HCC), SBO (small bowel obstruction) (HCC) (1998), Scoliosis, TIA (transient ischemic attack), Venous insufficiency, and Ventral hernia.   Surgical History    Past Surgical History:  Procedure Laterality Date   2nd look laparotomy  1982   ABDOMINAL HYSTERECTOMY     for ovarian cancer s/p hysterectomy total 1976 and exp lap 1980   APPENDECTOMY     CARDIAC CATHETERIZATION  11/2003   neg; a fib found    CARPAL TUNNEL RELEASE Left 03/06/2018   Procedure: CARPAL TUNNEL RELEASE ENDOSCOPIC;  Surgeon: Christena Flake, MD;  Location: [facility];  Service: Orthopedics;  Laterality: Left;  diabetic - oral meds   CARPAL TUNNEL RELEASE Left    Procedure: CARPAL TUNNEL RELEASE; Location: [facility]; Surgeon: Leron Croak, MD   CATARACT EXTRACTION Right 08/2005   cataract OD  2002   CHOLECYSTECTOMY  1986   CYSTOSCOPY WITH RETROGRADE PYELOGRAM, URETEROSCOPY AND STENT PLACEMENT Right 04/25/2023   Procedure: CYSTOSCOPY WITH RETROGRADE PYELOGRAM, URETEROSCOPY AND STENT PLACEMENT;  Surgeon: Jannifer Hick, MD;   Location: [facility];  Service: Urology;  Laterality: Right;   DEXA  9/04 and 1/02   EGD/dilation/colon  04/2004   ESOPHAGOGASTRODUODENOSCOPY (EGD) WITH PROPOFOL N/A 05/16/2016   Procedure: ESOPHAGOGASTRODUODENOSCOPY (EGD) WITH PROPOFOL with dilation;  Surgeon: Midge Minium, MD;  Location: [facility];  Service: Endoscopy;  Laterality: N/A;   FEMORAL HERNIA REPAIR Right 1958   fracture L elbow/wrist  2000   INTRAMEDULLARY (IM) NAIL INTERTROCHANTERIC Left 10/12/2020   Procedure: INTRAMEDULLARY (IM) NAIL INTERTROCHANTRIC;  Surgeon: Juanell Fairly, MD;  Location: [facility];  Service: Orthopedics;  Laterality: Left;   intussception/obstruction  03/1997   kidney stone x2  1991   KYPHOPLASTY N/A 04/13/2020   Procedure: [id number];  Surgeon: Kennedy Bucker, MD;  Location: [facility];  Service: Orthopedics;  Laterality: N/A;   KYPHOPLASTY N/A 05/18/2020   Procedure: L1 KYPHOPLASTY;  Surgeon: Kennedy Bucker, MD;  Location: [facility];  Service: Orthopedics;  Laterality: N/A;   KYPHOPLASTY N/A 07/06/2020   Procedure: L4  KYPHOPLASTY;  Surgeon: Kennedy Bucker, MD;  Location: [facility];  Service: Orthopedics;  Laterality: N/A;   laminectomy L4-5  1971   LUMBAR LAMINECTOMY     1970 ruptured disc    MOUTH SURGERY     myoview stress  11/2003   syncope (-)   REVERSE SHOULDER ARTHROPLASTY Left 10/06/2014   Procedure: REVERSE SHOULDER ARTHROPLASTY;  Surgeon: Christena Flake, MD;  Location: [facility];  Service: Orthopedics;  Laterality: Left;   stillbirth  1969   TOTAL ABDOMINAL HYSTERECTOMY W/ BILATERAL SALPINGOOPHORECTOMY  1980   ovarian cancer   TOTAL SHOULDER ARTHROPLASTY Left    WRIST FRACTURE SURGERY Right 02/2004     Social History   reports that she has never smoked. She has never used smokeless tobacco. She reports that she does not drink alcohol and does not use drugs.   Family History   Her family history includes Arthritis in her brother, brother, and sister; COPD in her brother; Cancer in her  brother, brother, and brother; Depression in her brother and sister; Diabetes in her brother, mother, and paternal grandfather; Early death in her brother, brother, and father; Hearing loss in her brother, brother, brother, daughter, sister, and sister; Heart disease in her brother, mother, sister, sister, and sister; Hyperlipidemia in her brother; Hypertension in her daughter and sister; Stroke in her sister.   Allergies Allergies  Allergen Reactions   Amiodarone Other (See Comments)    Severe Thyroid issues  Fosamax [Alendronate]     dysphagia   Penicillins Anaphylaxis and Other (See Comments)    Tolerated 1st generation cephalosporin (CEFAZOLIN) 07/06/2020, 10/12/2020, 04/25/2023)   Sulfa Antibiotics Itching and Rash    Itching    Actos [Pioglitazone]     Not effective    Amaryl [Glimepiride]     Not effective     Atorvastatin Other (See Comments)    Muscle aches & All statins per Patient   Bentyl [Dicyclomine Hcl]     Could not tolerate nausea, reduced concentration, h/a    Ciprofloxacin Other (See Comments)    High blood pressure     Codeine Nausea And Vomiting   Ezetimibe-Simvastatin Other (See Comments)    Muscle aches, nausea, back pain    Lovastatin Other (See Comments)    Myalgias   Macrobid [Nitrofurantoin Macrocrystal]     Severe Itching, rash    Protonix [Pantoprazole Sodium]     Esophageal problems  Wants removed from list   Rosiglitazone Maleate Other (See Comments)    Edema   Statins     Muscle and joint aches to all statins   Victoza [Liraglutide]     Nausea    Alendronate Sodium Rash    Dysphagia and ulceration    Bactrim [Sulfamethoxazole-Trimethoprim] Rash    itching     Home Medications  Prior to Admission medications   Medication Sig Start Date End Date Taking? Authorizing Provider  acetaminophen (TYLENOL) 500 MG tablet Take 1,000 mg by mouth every 6 (six) hours as needed for moderate pain (pain score 4-6).   Yes [provider]   alum & mag hydroxide-simeth (MAALOX/MYLANTA) 200-200-20 MG/5ML suspension Take 30 mLs by mouth every 4 (four) hours as needed for indigestion. 10/20/20  Yes Pennie Banter, DO  apixaban (ELIQUIS) 5 MG TABS tablet Take 5 mg by mouth 2 (two) times daily.   Yes [provider]  Ascorbic Acid (VITAMIN C) 1000 MG tablet Take 2,000 mg by mouth daily.   Yes [provider]  bisoprolol (ZEBETA) 5 MG tablet Take 5 mg by mouth every 12 (twelve) hours.   Yes [provider]  CALCIUM ANTACID EXTRA STRENGTH 750 MG chewable tablet Chew 2 tablets by mouth daily as needed (indigestion). 08/22/21  Yes [provider]  Cyanocobalamin (VITAMIN B-12) 5000 MCG LOZG Take 5,000 mcg by mouth daily.   Yes [provider]  diclofenac Sodium (VOLTAREN) 1 % GEL Apply 2 g topically in the morning, at noon, and at bedtime.   Yes [provider]  docusate sodium (COLACE) 100 MG capsule Take 100 mg by mouth daily as needed for mild constipation.   Yes [provider]  estradiol (ESTRACE) 0.1 MG/GM vaginal cream APPLY 0.5MG  (PEA SIZED AMOUNT) JUST INSIDE THE VAGINA WITH FINGER-TIP ON MONDAY, WEDNESDAY AND FRIDAY NIGHTS. 07/21/20  Yes McGowan, Carollee Herter A, PA-C  famotidine (PEPCID) 20 MG tablet Take 20 mg by mouth at bedtime. 12/29/21  Yes [provider]  fluticasone (FLONASE) 50 MCG/ACT nasal spray Place 1 spray into both nostrils daily.   Yes [provider]  folic acid (FOLVITE) 1 MG tablet Take 1 tablet (1 mg total) by mouth daily. 10/21/20  Yes Esaw Grandchild A, DO  furosemide (LASIX) 20 MG tablet Take 1 tablet (20 mg total) by mouth daily. 05/07/23  Yes Marcelino Duster, MD  hydrOXYzine (ATARAX) 25 MG tablet Take 25 mg by mouth every 8 (eight) hours as needed for anxiety.   Yes [provider]  insulin aspart (NOVOLOG) 100 UNIT/ML injection Inject 0-15 Units into the skin 4 (four) times daily -  before meals and at bedtime. Patient taking  differently: Inject 0-12 Units into the skin 2 (two) times daily. SS:200-249=2u, 250-299=4u, 300-349=6u, 350-399=8u, 400-449=10u, 450-499=12u, if 500 or greater= CALL MD 10/20/20  Yes Esaw Grandchild A, DO  insulin glargine-yfgn (SEMGLEE, YFGN,) 100 UNIT/ML injection Inject 8 Units into the skin at bedtime.   Yes [provider]  levothyroxine (SYNTHROID, LEVOTHROID) 50 MCG tablet Take 50 mcg by mouth daily before breakfast.   Yes [provider]  Magnesium Oxide 250 MG TABS Take 250 mg by mouth daily.   Yes [provider]  melatonin 5 MG TABS Take 1 tablet (5 mg total) by mouth at bedtime as needed. 05/07/23  Yes Marcelino Duster, MD  Multiple Vitamins-Iron (DAILY MULTIVITAMINS/IRON PO) Take 1 tablet by mouth daily.   Yes [provider]  Multiple Vitamins-Minerals (HEALTHY EYES SUPERVISION 2) CAPS Take 1 capsule by mouth 2 (two) times daily.   Yes [provider]  nitrofurantoin (MACRODANTIN) 100 MG capsule Take 1 capsule (100 mg total) by mouth daily for 5 days. 05/25/23 05/30/23 Yes Sondra Come, MD  nystatin powder Apply 1 Application topically every 8 (eight) hours as needed (under the breasts for rash).   Yes [provider]  Olopatadine HCl 0.2 % SOLN Place 1 drop into both eyes daily as needed (Allergies).   Yes [provider]  Omega-3 Fatty Acids (SUPER OMEGA 3 EPA/DHA) 1000 MG CAPS Take 1,000 mg by mouth in the morning and at bedtime.   Yes [provider]  ondansetron (ZOFRAN) 4 MG tablet Take 1 tablet (4 mg total) by mouth every 6 (six) hours as needed for nausea. 10/20/20  Yes Esaw Grandchild A, DO  pyridOXINE (B-6) 50 MG tablet Take 1 tablet (50 mg total) by mouth daily. 10/21/20  Yes Pennie Banter, DO  simethicone (MYLICON) 80 MG chewable tablet Chew 80 mg by mouth every 6 (six) hours as needed for flatulence.   Yes [provider]  TRADJENTA 5 MG TABS tablet Take 5 mg by mouth daily. 10/05/20  Yes  [provider]  glucose blood (ONE TOUCH ULTRA TEST) test strip Use to test blood sugar once daily E11.49 06/24/15   Bedsole, Amy E, MD  magnesium hydroxide (MILK OF MAGNESIA) 400 MG/5ML suspension Take 30 mLs by mouth daily as needed for mild constipation. 10/20/20   Pennie Banter, DO  Nystatin (GERHARDT'S BUTT CREAM) CREA Apply 1 Application topically 2 (two) times daily. 05/07/23   Marcelino Duster, MD  Skin Protectants, Misc. (DIMETHICONE-ZINC OXIDE) cream Apply topically 2 (two) times daily as needed for dry skin. Please label the bottle for the vaginal area only. Patient not taking: Reported on 04/25/2023 06/29/20   Michiel Cowboy A, PA-C  Scheduled Meds:  Chlorhexidine Gluconate Cloth  6 each Topical Daily   insulin aspart  0-6 Units Subcutaneous TID WC   sodium chloride flush  3 mL Intravenous Q12H   Continuous Infusions:  sodium chloride Stopped (05/25/23 2206)   meropenem (MERREM) IV Stopped (05/25/23 1745)   norepinephrine (LEVOPHED) Adult infusion 24 mcg/min (05/26/23 0700)   vasopressin 0.03 Units/min (05/26/23 0700)   PRN Meds:.acetaminophen **OR** acetaminophen, ondansetron **OR** ondansetron (ZOFRAN) IV, polyethylene glycol  Active Hospital Problem list   See systems below  Assessment & Plan:   #1 Septic Shock ~ PRESENT ON ADMISSION - DUE TO PROTEUS BACTEREMIA              -  Continue IV abx and vasopressor support              -lactate and procalcitonin trend              -reviewed with pharmacist - merrem >> rocephin IV              - Levophed and vasopressin with MAP goal 65              -RIJ - CVP monitoring     #2 Right Ureteral Stone infection ~ s/p Right Ureteral Stent on 1/1 and stent exchange on 01/31 -Proteus infection currently Recent #ESBL E. Coli BACTEREMIA on 01/25 -Monitor fever curve -Trend WBC's & Procalcitonin -Follow cultures as above -Continue empiric Meropenem pending cultures & sensitivities -Urology following, appreciate input     #Paroxymal Atrial Fibrillation Echocardiogram 04/27/23: LVEF 50-55%, indeterminate diastolic parameters, RV systolic function mildly reduced, moderately elevated pulmonary artery systolic pressure, small pericardial effusion is present (no tamponade), moderated TR -Continuous cardiac monitoring -Maintain MAP >65 -Vasopressors as needed to maintain MAP goal  -Hold home Eliquis and anticoagulation due to hematuria post stent placement       #Acute Kidney Injury - KDIGO 3 - due to hydronephrosis with urolithiasis #NAGMA with Lactic Acidosis #Mild Hypokalemia  #Mild Hyponatremia  -Monitor I&O's / urinary output -Follow BMP -Ensure adequate renal perfusion -Avoid nephrotoxic agents as able -Replace electrolytes as indicated ~ Pharmacy following for assistance with electrolyte replacement -IV fluids   #Thrombocytopenia likely in the setting of severe sepsis -Check DIC panel -Monitor for S/Sx of bleeding -Trend CBC -Lovenox for VTE Prophylaxis  -Transfuse for Hgb <7 -Transfuse Platelets for Platelet count <10 K, <50 K with active bleeding  #Transaminitis  suspect secondary to shock versus hepatic congestion. Chronically elevated alk phos elevation similar to presentation.  -Follow LFTs   #Diabetes Mellitus Type II #Hypothyroidism -CBG's q4h; Target range of 140 to 180 -SSI -Follow ICU Hypo/Hyperglycemia protocol -Continue Synthroid   #Acute Metabolic Encephalopathy  -Treatment of sepsis and metabolic derangements as outlined above -Provide supportive care -Promote normal sleep/wake cycle and family presence -Avoid sedating medications as able    Best practice:  Diet:  Oral Pain/Anxiety/Delirium protocol (if indicated): No VAP protocol (if indicated): Not indicated DVT prophylaxis: Contraindicated GI prophylaxis: N/A Glucose control:  SSI Yes Central venous access:  Yes, and it is still needed Arterial line:  N/A Foley:  Yes, and it is still needed Mobility:  bed  rest  PT consulted: N/A Last date of multidisciplinary goals of care discussion [updated son at the bedside] Code Status:  DNR Disposition: ICU   = Goals of Care = Code Status Order: DNR/DNI  Primary Emergency Contact: Whiteford,Tim, Home Phone: [phone] Patient wishes to pursue ongoing treatment, but concurred that if deteriorated to pulselessness, patient would prefer a natural death as opposed to invasive measures such as CPR and intubation.    Critical care provider statement:   Total critical care time: 33 minutes   Performed by: Karna Christmas MD   Critical care time was exclusive of separately billable procedures and treating other patients.   Critical care was necessary to treat or prevent imminent or life-threatening deterioration.   Critical care was time spent personally by me on the following activities: development of treatment plan with patient and/or surrogate as well as nursing, discussions with consultants, evaluation of patient's response to treatment, examination of patient, obtaining history from patient or surrogate, ordering and performing treatments and interventions, ordering and review of laboratory  studies, ordering and review of radiographic studies, pulse oximetry and re-evaluation of patient's condition.    Vida Rigger, M.D.  Pulmonary & Critical Care Medicine

## 2023-05-26 NOTE — Anesthesia Postprocedure Evaluation (Signed)
Anesthesia Post Note  Patient: Jodi Reeves  Procedure(s) Performed: CYSTOSCOPY/URETEROSCOPY/STENT EXCHANGE (Right)  Patient location during evaluation: ICU Anesthesia Type: General Level of consciousness: patient cooperative and responds to stimulation Pain management: pain level controlled Vital Signs Assessment: post-procedure vital signs reviewed and stable Respiratory status: spontaneous breathing and patient connected to nasal cannula oxygen Cardiovascular status: stable (Pt remains on norepinephrine and vasopressin for blood pressure) Postop Assessment: no apparent nausea or vomiting Anesthetic complications: no   No notable events documented.   Last Vitals:  Vitals:   05/26/23 0700 05/26/23 0715  BP: 103/61 (!) 89/54  Pulse: 85 86  Resp: (!) 24 20  Temp:    SpO2: 100% 100%    Last Pain:  Vitals:   05/26/23 0600  TempSrc:   PainSc: Asleep                 Rosanne Gutting

## 2023-05-26 DEATH — deceased

## 2023-05-27 ENCOUNTER — Encounter: Payer: Self-pay | Admitting: Urology

## 2023-05-27 DIAGNOSIS — G9340 Encephalopathy, unspecified: Secondary | ICD-10-CM | POA: Diagnosis not present

## 2023-05-27 DIAGNOSIS — N201 Calculus of ureter: Secondary | ICD-10-CM | POA: Diagnosis not present

## 2023-05-27 DIAGNOSIS — A419 Sepsis, unspecified organism: Secondary | ICD-10-CM | POA: Diagnosis not present

## 2023-05-27 DIAGNOSIS — Z515 Encounter for palliative care: Secondary | ICD-10-CM | POA: Diagnosis not present

## 2023-05-27 LAB — GLUCOSE, CAPILLARY
Glucose-Capillary: 121 mg/dL — ABNORMAL HIGH (ref 70–99)
Glucose-Capillary: 70 mg/dL (ref 70–99)
Glucose-Capillary: 82 mg/dL (ref 70–99)
Glucose-Capillary: 90 mg/dL (ref 70–99)
Glucose-Capillary: 90 mg/dL (ref 70–99)
Glucose-Capillary: 92 mg/dL (ref 70–99)
Glucose-Capillary: 94 mg/dL (ref 70–99)
Glucose-Capillary: 99 mg/dL (ref 70–99)

## 2023-05-27 LAB — CBC
HCT: 32 % — ABNORMAL LOW (ref 36.0–46.0)
Hemoglobin: 10.8 g/dL — ABNORMAL LOW (ref 12.0–15.0)
MCH: 33.8 pg (ref 26.0–34.0)
MCHC: 33.8 g/dL (ref 30.0–36.0)
MCV: 100 fL (ref 80.0–100.0)
Platelets: 108 10*3/uL — ABNORMAL LOW (ref 150–400)
RBC: 3.2 MIL/uL — ABNORMAL LOW (ref 3.87–5.11)
RDW: 14.6 % (ref 11.5–15.5)
WBC: 84.3 10*3/uL (ref 4.0–10.5)
nRBC: 0 % (ref 0.0–0.2)

## 2023-05-27 LAB — COMPREHENSIVE METABOLIC PANEL
ALT: 35 U/L (ref 0–44)
AST: 136 U/L — ABNORMAL HIGH (ref 15–41)
Albumin: 2.1 g/dL — ABNORMAL LOW (ref 3.5–5.0)
Alkaline Phosphatase: 213 U/L — ABNORMAL HIGH (ref 38–126)
Anion gap: 18 — ABNORMAL HIGH (ref 5–15)
BUN: 35 mg/dL — ABNORMAL HIGH (ref 8–23)
CO2: 16 mmol/L — ABNORMAL LOW (ref 22–32)
Calcium: 7.2 mg/dL — ABNORMAL LOW (ref 8.9–10.3)
Chloride: 101 mmol/L (ref 98–111)
Creatinine, Ser: 2.24 mg/dL — ABNORMAL HIGH (ref 0.44–1.00)
GFR, Estimated: 20 mL/min — ABNORMAL LOW (ref >60)
Glucose, Bld: 106 mg/dL — ABNORMAL HIGH (ref 70–99)
Potassium: 4.3 mmol/L (ref 3.5–5.1)
Sodium: 135 mmol/L (ref 135–145)
Total Bilirubin: 1.4 mg/dL — ABNORMAL HIGH (ref 0.0–1.2)
Total Protein: 5.9 g/dL — ABNORMAL LOW (ref 6.5–8.1)

## 2023-05-27 MED ORDER — LACTATED RINGERS IV BOLUS
1000.0000 mL | Freq: Once | INTRAVENOUS | Status: AC
  Administered 2023-05-27: 1000 mL via INTRAVENOUS

## 2023-05-27 MED ORDER — HYDROCORTISONE SOD SUC (PF) 100 MG IJ SOLR
100.0000 mg | Freq: Two times a day (BID) | INTRAMUSCULAR | Status: DC
  Administered 2023-05-27 – 2023-05-28 (×4): 100 mg via INTRAVENOUS
  Filled 2023-05-27 (×4): qty 2

## 2023-05-27 NOTE — Evaluation (Addendum)
Clinical/Bedside Swallow Evaluation Patient Details  Name: Jodi Reeves MRN: [id number] Date of Birth: Jan 09, 1928  Today's Date: 05/27/2023 Time: SLP Start Time (ACUTE ONLY): 1440 SLP Stop Time (ACUTE ONLY): 1500 SLP Time Calculation (min) (ACUTE ONLY): 20 min  Past Medical History:  Past Medical History:  Diagnosis Date   (HFpEF) heart failure with preserved ejection fraction (HCC)    a.) TTE 04/27/2015: EF 65-70%, mild LVH, G2DD, mild RAE, deg MV with mild MAC, AoV sclerosis without stenosis, mild-mod TR, RVSP 30-35; b.) TTE 10/24/2018: EF 55-60%, no RWMAs, G1DD. mod LAE, RVSP 45.8, mild-mod TR; c.) TTE 04/27/2023: EF 50-55%, no RWMAs, mildly reduced RVSF, mod BAE, RVSP 47.4, mild AR/MR, mod TR   Allergy    Anxiety    Aortic atherosclerosis (HCC)    B12 deficiency    Balance disorder    Cardiomegaly    Colon polyps    Coronary artery disease, non-occlusive    a.) LHC 11/2003: 10% LAD, 20% LCx , 40% RCA - med mgmt; b.) nuc stress test 12/15: small region of mild ischemia in the apical region with WMA also noted in the apical region, EF 60%; declined invasive evaluation   DDD (degenerative disc disease), lumbar    DM2 (diabetes mellitus, type 2) (HCC)    Dysphagia    Esophageal obstruction    Esophageal web    Falls frequently    a.) associated with multiple associated injuries (left shoudler and left wrist fractures; left hand paresthesias)   GERD (gastroesophageal reflux disease)    Granuloma annulare 2010   History of chicken pox    History of eating disorder    HLD (hyperlipidemia)    HTN (hypertension)    Hypothyroidism    Incontinence of bowel    Insomnia    a.) takes melatonin PRN   Irritable bowel syndrome with diarrhea    Lymphedema of both lower extremities    Nephrolithiasis    On apixaban therapy    Osteopenia    Osteoporosis    Ovarian cancer (HCC) 1980   Persistent atrial fibrillation (HCC) 2005   a.) Dx'd in 2005; b.) CHA2DS2-VASc = 56 (age x2, sex, HFpEF,  HTN, TIA x 2, vascular disease, T2DM) as of 05/24/2023; c.) cardiac rate/rhythm maintained on oral bisoprolol; chronically anticoagulated using apixaban   Poor memory    Pulmonary hypertension (HCC)    a.) TTE 04/27/2015:  RVSP 30-35 mmHg; b.) TTE 10/24/2018: RVSP 45.8 mmHg; c.) TTE 04/27/2023:  RVSP 47.4 mmHg   SBO (small bowel obstruction) (HCC) 1998   Scoliosis    TIA (transient ischemic attack)    Venous insufficiency    Ventral hernia    Past Surgical History:  Past Surgical History:  Procedure Laterality Date   2nd look laparotomy  1982   ABDOMINAL HYSTERECTOMY     for ovarian cancer s/p hysterectomy total 1976 and exp lap 1980   APPENDECTOMY     CARDIAC CATHETERIZATION  11/2003   neg; a fib found    CARPAL TUNNEL RELEASE Left 03/06/2018   Procedure: CARPAL TUNNEL RELEASE ENDOSCOPIC;  Surgeon: Christena Flake, MD;  [facility];  Service: Orthopedics;  Laterality: Left;  diabetic - oral meds   CARPAL TUNNEL RELEASE Left    Procedure: CARPAL TUNNEL RELEASE; Location: [facility]; Surgeon: Leron Croak, MD   CATARACT EXTRACTION Right 08/2005   cataract OD  2002   CHOLECYSTECTOMY  1986   CYSTOSCOPY WITH RETROGRADE PYELOGRAM, URETEROSCOPY AND STENT PLACEMENT Right 04/25/2023   Procedure:  CYSTOSCOPY WITH RETROGRADE PYELOGRAM, URETEROSCOPY AND STENT PLACEMENT;  Surgeon: Jannifer Hick, MD;  Location: [facility];  Service: Urology;  Laterality: Right;   DEXA  9/04 and 1/02   EGD/dilation/colon  04/2004   ESOPHAGOGASTRODUODENOSCOPY (EGD) WITH PROPOFOL N/A 05/16/2016   Procedure: ESOPHAGOGASTRODUODENOSCOPY (EGD) WITH PROPOFOL with dilation;  Surgeon: Midge Minium, MD;  Location: [facility];  Service: Endoscopy;  Laterality: N/A;   FEMORAL HERNIA REPAIR Right 1958   fracture L elbow/wrist  2000   INTRAMEDULLARY (IM) NAIL INTERTROCHANTERIC Left 10/12/2020   Procedure: INTRAMEDULLARY (IM) NAIL INTERTROCHANTRIC;  Surgeon: Juanell Fairly, MD;  Location: [facility];  Service:  Orthopedics;  Laterality: Left;   intussception/obstruction  03/1997   kidney stone x2  1991   KYPHOPLASTY N/A 04/13/2020   Procedure: [id number];  Surgeon: Kennedy Bucker, MD;  Location: [facility];  Service: Orthopedics;  Laterality: N/A;   KYPHOPLASTY N/A 05/18/2020   Procedure: L1 KYPHOPLASTY;  Surgeon: Kennedy Bucker, MD;  Location: [facility];  Service: Orthopedics;  Laterality: N/A;   KYPHOPLASTY N/A 07/06/2020   Procedure: L4  KYPHOPLASTY;  Surgeon: Kennedy Bucker, MD;  Location: [facility];  Service: Orthopedics;  Laterality: N/A;   laminectomy L4-5  1971   LUMBAR LAMINECTOMY     1970 ruptured disc    MOUTH SURGERY     myoview stress  11/2003   syncope (-)   REVERSE SHOULDER ARTHROPLASTY Left 10/06/2014   Procedure: REVERSE SHOULDER ARTHROPLASTY;  Surgeon: Christena Flake, MD;  Location: [facility];  Service: Orthopedics;  Laterality: Left;   stillbirth  1969   TOTAL ABDOMINAL HYSTERECTOMY W/ BILATERAL SALPINGOOPHORECTOMY  1980   ovarian cancer   TOTAL SHOULDER ARTHROPLASTY Left    WRIST FRACTURE SURGERY Right 02/2004   HPI:  89 y.o with significant PMH of CAD, d CHF, HTN, GERD, HLD, hypothyroidism, TIA, persistent atrial fibrillation on Eliquis, T2DM, frequent UTIs and IBS who presented to the to the hospital on 05/25/2023 for elective definitive ureteroscopy. Patient was recently admitted to the hospital from 01/01-01/13 with Acute Metabolic Encephalopathy in setting of Severe Sepsis due to  ESBL E. Coli BACTEREMIA due to Right Ureteral Stone infection, status post Cystoscopy and right ureteral stent placement complicated by shock.  Pt intubated for ureteroscopy only. Pt dischrged to PACU and then transferred to ICU d/t hypotensive state. DG Chest on 05/27/2023 revealed 1. Right internal jugular approach central venous catheter has been advanced with distal tip now terminating at the level of the right atrium. No pneumothorax. 2. Small bilateral pleural effusions with bibasilar atelectasis.     Assessment / Plan / Recommendation  Clinical Impression  ST services consulted d/t coughing at bedside with trials of thin liquids. During this assessment, pt presents with a vocal quality that is c/b hoarse and very strained in nature. She reports this as acute. She also endorses esophageal dysfunction but isn't able to recall details. When consuming ice chips, thin liquids via spoon and applesauce, pt presents with immediate coughing, throat clearing and wet vocal quality. She also has intermittent hocking up of residue. Pt also burped following completion of each swallow. While pt's swallow appeared swift at bedside, concern for potential glottal insufficiency d/t vocal quality and/or esophageal dysfunction. Remain NPO with ST services to follow.       This Clinical research associate spoke with pt's son via phone after the session. He endorses that her hoarseness is acute to this hospitalization as well as coughing with PO intake. He states that she has had her esophagus stretched "several  times but not recently." No currently followed by GI. SLP Visit Diagnosis: Dysphagia, pharyngeal phase (R13.13);Dysphagia, pharyngoesophageal phase (R13.14)    Aspiration Risk  Moderate aspiration risk;Mild aspiration risk    Diet Recommendation NPO    Medication Administration: Via alternative means    Other  Recommendations Recommended Consults: Consider ENT evaluation;Consider GI evaluation;Consider esophageal assessment Oral Care Recommendations: Oral care QID    Recommendations for follow up therapy are one component of a multi-disciplinary discharge planning process, led by the attending physician.  Recommendations may be updated based on patient status, additional functional criteria and insurance authorization.  Follow up Recommendations Follow physician's recommendations for discharge plan and follow up therapies      Assistance Recommended at Discharge    Functional Status Assessment Patient has had a recent  decline in their functional status and/or demonstrates limited ability to make significant improvements in function in a reasonable and predictable amount of time  Frequency and Duration min 2x/week  2 weeks       Prognosis Prognosis for improved oropharyngeal function: Fair Barriers to Reach Goals: Cognitive deficits;Time post onset (dysphonic, hx of GI issuses)      Swallow Study   General Date of Onset: 05/27/23 HPI: 89 y.o with significant PMH of CAD, d CHF, HTN, GERD, HLD, hypothyroidism, TIA, persistent atrial fibrillation on Eliquis, T2DM, frequent UTIs and IBS who presented to the to the hospital on 05/25/2023 for elective definitive ureteroscopy. Patient was recently admitted to the hospital from 01/01-01/13 with Acute Metabolic Encephalopathy in setting of Severe Sepsis due to  ESBL E. Coli BACTEREMIA due to Right Ureteral Stone infection, status post Cystoscopy and right ureteral stent placement complicated by shock.  Pt intubated for ureteroscopy only. Pt dischrged to PACU and then transferred to ICU d/t hypotensive state. DG Chest on 05/27/2023 revealed 1. Right internal jugular approach central venous catheter has been advanced with distal tip now terminating at the level of the right atrium. No pneumothorax. 2. Small bilateral pleural effusions with bibasilar atelectasis. Type of Study: Bedside Swallow Evaluation Previous Swallow Assessment: 2021 - BSE - discharged on regular diet with thin liquids Diet Prior to this Study: NPO Temperature Spikes Noted: No Respiratory Status: Nasal cannula History of Recent Intubation: Yes Total duration of intubation (days): 1 days Date extubated: 05/25/23 Behavior/Cognition: Alert;Distractible;Cooperative;Pleasant mood;Confused Oral Cavity Assessment: Within Functional Limits Oral Care Completed by SLP: Recent completion by staff Oral Cavity - Dentition: Adequate natural dentition Vision: Functional for self-feeding Self-Feeding Abilities:  Needs assist;Able to feed self Patient Positioning: Upright in bed Baseline Vocal Quality:  (dysphonic - hoarse, strained, high pitch) Volitional Cough: Weak Volitional Swallow: Able to elicit    Oral/Motor/Sensory Function Overall Oral Motor/Sensory Function: Within functional limits   Ice Chips Ice chips: Impaired Presentation: Spoon Pharyngeal Phase Impairments: Suspected delayed Swallow;Decreased hyoid-laryngeal movement;Cough - Immediate;Wet Vocal Quality;Throat Clearing - Immediate   Thin Liquid Thin Liquid: Impaired Presentation: Spoon;Cup Pharyngeal  Phase Impairments: Suspected delayed Swallow;Decreased hyoid-laryngeal movement;Wet Vocal Quality;Throat Clearing - Immediate;Cough - Immediate    Nectar Thick Nectar Thick Liquid: Not tested   Honey Thick Honey Thick Liquid: Not tested   Puree Puree: Impaired Presentation: Spoon Pharyngeal Phase Impairments: Suspected delayed Swallow;Decreased hyoid-laryngeal movement;Wet Vocal Quality;Throat Clearing - Immediate;Cough - Immediate   Solid     Solid: Not tested     Sultan Pargas B. Dreama Saa, M.S., CCC-SLP, Tree surgeon Certified Brain Injury Specialist [facility]  [facility] Office [phone] Ascom [phone] Fax [phone]

## 2023-05-27 NOTE — Progress Notes (Signed)
NAME:  Jodi Reeves, MRN:  [id number], DOB:  1927/09/20, LOS: 2 ADMISSION DATE:  05/25/2023, CONSULTATION DATE:  05/25/23 REFERRING MD:  Verdene Lennert, REASON FOR CONSULT:  Septic Shock   BRIEF HPI  89 y.o with significant PMH of CAD, d CHF, HTN, GERD, HLD, hypothyroidism, TIA, persistent atrial fibrillation on Eliquis, T2DM, frequent UTIs and IBS who presented to the to the hospital on 05/25/2023 for elective definitive ureteroscopy.  Patient was recently admitted to the hospital from 01/25-01/13 with Acute Metabolic Encephalopathy in setting of Severe Sepsis due to  ESBL E. Coli BACTEREMIA due to Right Ureteral Stone infection, status post Cystoscopy and right ureteral stent placement complicated by shock.    Hospital Course: Patient underwent ureteroscopy and stent exchange. She was transferred to  PACU and hospitalist consulted for admission.  Initial vital signs showed HR of 108 beats/minute, BP mm Hg, the RR 15 breaths/minute, and the oxygen saturation 77% on HFNC and a temperature of 98.50F (37C).  Pertinent Labs/Diagnostics Findings: Na+/ K+: 135/3.7 Glucose: 178 BUN/Cr.:20/1.46 AST/ALT:355/48 WBC: 47.4 K/L Hgb/Hct:10.5/31.3 Plts: 140 PCT: 122.61  Lactic acid: 5.9~7.0~5.6 troponin: 43  BNP: 256.8  CXR>see result below Patient given 30 cc/kg of fluids and started on broad-spectrum for urosepsis with shock. Patient remained hypotensive despite IVF boluses therefore was started on Levophed.   Past Medical History  CAD, d CHF, HTN, GERD, HLD, hypothyroidism, TIA, persistent atrial fibrillation on Eliquis, T2DM, frequent UTIs and IBS   Significant Hospital Events   05/25/23: Admit to ICU with severe urosepsis with septic shock secondary to ureteral stone s/p stent placement  05/26/23- patient ripped out her central line while moving around.  This was discussed with family and replaced due to need for high dose levophed and infusion of vasopressin.  She is clinically improved today  and more awake more alert.  I met with son Jodi Reeves and granddaughter at bedside we reviewed bloodwork and imaging.   05/27/23- patient presistent on levophed/vasopressin.  Increased IVF and added solucortef this am.   Consults:  PCCM Urology  Procedures:  01/31>right ureteroscopy and stent change   Significant Diagnostic Tests:  01/31: Chest Xray> IMPRESSION: 1. Stable left basilar consolidation and small left effusion.  Interim History / Subjective:    -  Micro Data:  01/31: Blood culture x2> 01/31: Urine Culture> 01/31: MRSA PCR>>   Antimicrobials:  1/31 Meropenem>>  OBJECTIVE  Blood pressure (!) 103/46, pulse (!) 115, temperature 98.1 F (36.7 C), temperature source Oral, resp. rate (!) 24, height 5\' 3"  (1.6 m), weight 78.7 kg, SpO2 96%. CVP:  [7 mmHg-20 mmHg] 8 mmHg    Intake/Output Summary (Last 24 hours) at 05/27/2023 1126 Last data filed at 05/27/2023 1100 Gross per 24 hour  Intake 589.72 ml  Output 136 ml  Net 453.72 ml   Filed Weights   05/25/23 1011 05/25/23 1950  Weight: 82.3 kg 78.7 kg     Physical Examination  GENERAL: 89 year-old critically ill patient lying in the bed in no acute distress EYES: PEERLA. No scleral icterus. Extraocular muscles intact.  HEENT: Head atraumatic, normocephalic. Oropharynx and nasopharynx clear.  NECK:  No JVD, supple  LUNGS:NBRS.  No use of accessory muscles of respiration.  CARDIOVASCULAR: S1, S2 normal. No murmurs, rubs, or gallops.  ABDOMEN: Soft, NTND, mid pubic EXTREMITIES: + pitting edema of bilateral lower extremities.  Capillary refill < 3 seconds in all extremities. Pulses palpable distally. NEUROLOGIC: no FND grossly SKIN: No obvious rash, lesion, or ulcer. Warm to  touch Labs/imaging that I havepersonally reviewed  (right click and "Reselect all SmartList Selections" daily)     Labs   CBC: Recent Labs  Lab 05/25/23 1629 05/25/23 2348 05/26/23 0505 05/27/23 0622  WBC 8.4 47.4* 60.9* 84.3*  NEUTROABS 7.8*  44.2*  --   --   HGB 10.9* 10.5* 11.1* 10.8*  HCT 32.3* 31.3* 33.5* 32.0*  MCV 97.6 99.1 101.2* 100.0  PLT 134* 140* 135* 108*    Basic Metabolic Panel: Recent Labs  Lab 05/25/23 1629 05/25/23 2348 05/26/23 0505 05/27/23 0622  NA 137 135 134* 135  K 3.4* 3.7 3.8 4.3  CL 101 102 102 101  CO2 21* 19* 18* 16*  GLUCOSE 118* 178* 209* 106*  BUN 20 20 21  35*  CREATININE 1.17* 1.46* 1.60* 2.24*  CALCIUM 7.8* 7.3* 7.2* 7.2*   GFR: Estimated Creatinine Clearance: 15.6 mL/min (A) (by C-G formula based on SCr of 2.24 mg/dL (H)). Recent Labs  Lab 05/25/23 1629 05/25/23 1958 05/25/23 2253 05/25/23 2348 05/26/23 0229 05/26/23 0505 05/27/23 0622  PROCALCITON  --  122.61  --   --   --   --   --   WBC 8.4  --   --  47.4*  --  60.9* 84.3*  LATICACIDVEN 5.9* 7.0* 5.6*  --  6.4* 6.5*  --     Liver Function Tests: Recent Labs  Lab 05/25/23 1629 05/26/23 0505 05/27/23 0622  AST 355* 253* 136*  ALT 48* 47* 35  ALKPHOS 313* 242* 213*  BILITOT 2.3* 2.7* 1.4*  PROT 6.2* 5.4* 5.9*  ALBUMIN 2.2* 1.9* 2.1*   No results for input(s): "LIPASE", "AMYLASE" in the last 168 hours. No results for input(s): "AMMONIA" in the last 168 hours.  ABG    Component Value Date/Time   PHART 7.39 04/26/2023 2028   PCO2ART 24 (L) 04/26/2023 2028   PO2ART 73 (L) 04/26/2023 2028   HCO3 14.5 (L) 04/26/2023 2028   ACIDBASEDEF 8.6 (H) 04/26/2023 2028   O2SAT 97.8 04/26/2023 2028     Coagulation Profile: Recent Labs  Lab 05/26/23 0246  INR 3.9*    Cardiac Enzymes: No results for input(s): "CKTOTAL", "CKMB", "CKMBINDEX", "TROPONINI" in the last 168 hours.  HbA1C: Hgb A1c MFr Bld  Date/Time Value Ref Range Status  04/26/2023 05:06 AM 8.0 (H) 4.8 - 5.6 % Final    Comment:    (NOTE)         Prediabetes: 5.7 - 6.4         Diabetes: >6.4         Glycemic control for adults with diabetes: <7.0   10/10/2020 08:10 AM 7.8 (H) 4.8 - 5.6 % Final    Comment:    (NOTE)         Prediabetes: 5.7 -  6.4         Diabetes: >6.4         Glycemic control for adults with diabetes: <7.0     CBG: Recent Labs  Lab 05/26/23 1641 05/26/23 1951 05/26/23 2358 05/27/23 0420 05/27/23 0829  GLUCAP 161* 139* 121* 99 90    Review of Systems:   Unable to be obtained secondary to the patient's intubated and sedated status.   Past Medical History  She,  has a past medical history of (HFpEF) heart failure with preserved ejection fraction (HCC), Allergy, Anxiety, Aortic atherosclerosis (HCC), B12 deficiency, Balance disorder, Cardiomegaly, Colon polyps, Coronary artery disease, non-occlusive, DDD (degenerative disc disease), lumbar, DM2 (diabetes mellitus, type 2) (HCC), Dysphagia,  Esophageal obstruction, Esophageal web, Falls frequently, GERD (gastroesophageal reflux disease), Granuloma annulare (2010), History of chicken pox, History of eating disorder, HLD (hyperlipidemia), HTN (hypertension), Hypothyroidism, Incontinence of bowel, Insomnia, Irritable bowel syndrome with diarrhea, Lymphedema of both lower extremities, Nephrolithiasis, On apixaban therapy, Osteopenia, Osteoporosis, Ovarian cancer (HCC) (1980), Persistent atrial fibrillation (HCC) (2005), Poor memory, Pulmonary hypertension (HCC), SBO (small bowel obstruction) (HCC) (1998), Scoliosis, TIA (transient ischemic attack), Venous insufficiency, and Ventral hernia.   Surgical History    Past Surgical History:  Procedure Laterality Date   2nd look laparotomy  1982   ABDOMINAL HYSTERECTOMY     for ovarian cancer s/p hysterectomy total 1976 and exp lap 1980   APPENDECTOMY     CARDIAC CATHETERIZATION  11/2003   neg; a fib found    CARPAL TUNNEL RELEASE Left 03/06/2018   Procedure: CARPAL TUNNEL RELEASE ENDOSCOPIC;  Surgeon: Christena Flake, MD;  Location: [facility];  Service: Orthopedics;  Laterality: Left;  diabetic - oral meds   CARPAL TUNNEL RELEASE Left    Procedure: CARPAL TUNNEL RELEASE; Location: [facility]; Surgeon: Leron Croak, MD    CATARACT EXTRACTION Right 08/2005   cataract OD  2002   CHOLECYSTECTOMY  1986   CYSTOSCOPY WITH RETROGRADE PYELOGRAM, URETEROSCOPY AND STENT PLACEMENT Right 04/25/2023   Procedure: CYSTOSCOPY WITH RETROGRADE PYELOGRAM, URETEROSCOPY AND STENT PLACEMENT;  Surgeon: Jannifer Hick, MD;  Location: [facility];  Service: Urology;  Laterality: Right;   DEXA  9/04 and 1/02   EGD/dilation/colon  04/2004   ESOPHAGOGASTRODUODENOSCOPY (EGD) WITH PROPOFOL N/A 05/16/2016   Procedure: ESOPHAGOGASTRODUODENOSCOPY (EGD) WITH PROPOFOL with dilation;  Surgeon: Midge Minium, MD;  Location: [facility];  Service: Endoscopy;  Laterality: N/A;   FEMORAL HERNIA REPAIR Right 1958   fracture L elbow/wrist  2000   INTRAMEDULLARY (IM) NAIL INTERTROCHANTERIC Left 10/12/2020   Procedure: INTRAMEDULLARY (IM) NAIL INTERTROCHANTRIC;  Surgeon: Juanell Fairly, MD;  Location: [facility];  Service: Orthopedics;  Laterality: Left;   intussception/obstruction  03/1997   kidney stone x2  1991   KYPHOPLASTY N/A 04/13/2020   Procedure: [id number];  Surgeon: Kennedy Bucker, MD;  Location: [facility];  Service: Orthopedics;  Laterality: N/A;   KYPHOPLASTY N/A 05/18/2020   Procedure: L1 KYPHOPLASTY;  Surgeon: Kennedy Bucker, MD;  Location: [facility];  Service: Orthopedics;  Laterality: N/A;   KYPHOPLASTY N/A 07/06/2020   Procedure: L4  KYPHOPLASTY;  Surgeon: Kennedy Bucker, MD;  Location: [facility];  Service: Orthopedics;  Laterality: N/A;   laminectomy L4-5  1971   LUMBAR LAMINECTOMY     1970 ruptured disc    MOUTH SURGERY     myoview stress  11/2003   syncope (-)   REVERSE SHOULDER ARTHROPLASTY Left 10/06/2014   Procedure: REVERSE SHOULDER ARTHROPLASTY;  Surgeon: Christena Flake, MD;  Location: [facility];  Service: Orthopedics;  Laterality: Left;   stillbirth  1969   TOTAL ABDOMINAL HYSTERECTOMY W/ BILATERAL SALPINGOOPHORECTOMY  1980   ovarian cancer   TOTAL SHOULDER ARTHROPLASTY Left    WRIST FRACTURE SURGERY Right 02/2004      Social History   reports that she has never smoked. She has never used smokeless tobacco. She reports that she does not drink alcohol and does not use drugs.   Family History   Her family history includes Arthritis in her brother, brother, and sister; COPD in her brother; Cancer in her brother, brother, and brother; Depression in her brother and sister; Diabetes in her brother, mother, and paternal grandfather; Early death in her brother, brother, and  father; Hearing loss in her brother, brother, brother, daughter, sister, and sister; Heart disease in her brother, mother, sister, sister, and sister; Hyperlipidemia in her brother; Hypertension in her daughter and sister; Stroke in her sister.   Allergies Allergies  Allergen Reactions   Amiodarone Other (See Comments)    Severe Thyroid issues    Fosamax [Alendronate]     dysphagia   Penicillins Anaphylaxis and Other (See Comments)    Tolerated 1st generation cephalosporin (CEFAZOLIN) 07/06/2020, 10/12/2020, 04/25/2023)   Sulfa Antibiotics Itching and Rash    Itching    Actos [Pioglitazone]     Not effective    Amaryl [Glimepiride]     Not effective     Atorvastatin Other (See Comments)    Muscle aches & All statins per Patient   Bentyl [Dicyclomine Hcl]     Could not tolerate nausea, reduced concentration, h/a    Ciprofloxacin Other (See Comments)    High blood pressure     Codeine Nausea And Vomiting   Ezetimibe-Simvastatin Other (See Comments)    Muscle aches, nausea, back pain    Lovastatin Other (See Comments)    Myalgias   Macrobid [Nitrofurantoin Macrocrystal]     Severe Itching, rash    Protonix [Pantoprazole Sodium]     Esophageal problems  Wants removed from list   Rosiglitazone Maleate Other (See Comments)    Edema   Statins     Muscle and joint aches to all statins   Victoza [Liraglutide]     Nausea    Alendronate Sodium Rash    Dysphagia and ulceration    Bactrim [Sulfamethoxazole-Trimethoprim] Rash     itching     Home Medications  Prior to Admission medications   Medication Sig Start Date End Date Taking? Authorizing Provider  acetaminophen (TYLENOL) 500 MG tablet Take 1,000 mg by mouth every 6 (six) hours as needed for moderate pain (pain score 4-6).   Yes [provider]  alum & mag hydroxide-simeth (MAALOX/MYLANTA) 200-200-20 MG/5ML suspension Take 30 mLs by mouth every 4 (four) hours as needed for indigestion. 10/20/20  Yes Pennie Banter, DO  apixaban (ELIQUIS) 5 MG TABS tablet Take 5 mg by mouth 2 (two) times daily.   Yes [provider]  Ascorbic Acid (VITAMIN C) 1000 MG tablet Take 2,000 mg by mouth daily.   Yes [provider]  bisoprolol (ZEBETA) 5 MG tablet Take 5 mg by mouth every 12 (twelve) hours.   Yes [provider]  CALCIUM ANTACID EXTRA STRENGTH 750 MG chewable tablet Chew 2 tablets by mouth daily as needed (indigestion). 08/22/21  Yes [provider]  Cyanocobalamin (VITAMIN B-12) 5000 MCG LOZG Take 5,000 mcg by mouth daily.   Yes [provider]  diclofenac Sodium (VOLTAREN) 1 % GEL Apply 2 g topically in the morning, at noon, and at bedtime.   Yes [provider]  docusate sodium (COLACE) 100 MG capsule Take 100 mg by mouth daily as needed for mild constipation.   Yes [provider]  estradiol (ESTRACE) 0.1 MG/GM vaginal cream APPLY 0.5MG  (PEA SIZED AMOUNT) JUST INSIDE THE VAGINA WITH FINGER-TIP ON MONDAY, WEDNESDAY AND FRIDAY NIGHTS. 07/21/20  Yes McGowan, Carollee Herter A, PA-C  famotidine (PEPCID) 20 MG tablet Take 20 mg by mouth at bedtime. 12/29/21  Yes [provider]  fluticasone (FLONASE) 50 MCG/ACT nasal spray Place 1 spray into both nostrils daily.   Yes [provider]  folic acid (FOLVITE) 1 MG tablet Take 1 tablet (1 mg  total) by mouth daily. 10/21/20  Yes Esaw Grandchild A, DO  furosemide (LASIX) 20 MG tablet Take 1 tablet (20 mg total) by mouth daily. 05/07/23  Yes Marcelino Duster, MD  hydrOXYzine (ATARAX) 25 MG tablet Take 25 mg by mouth every 8 (eight) hours as needed for anxiety.   Yes [provider]  insulin aspart (NOVOLOG) 100 UNIT/ML injection Inject 0-15 Units into the skin 4 (four) times daily -  before meals and at bedtime. Patient taking differently: Inject 0-12 Units into the skin 2 (two) times daily. SS:200-249=2u, 250-299=4u, 300-349=6u, 350-399=8u, 400-449=10u, 450-499=12u, if 500 or greater= CALL MD 10/20/20  Yes Esaw Grandchild A, DO  insulin glargine-yfgn (SEMGLEE, YFGN,) 100 UNIT/ML injection Inject 8 Units into the skin at bedtime.   Yes [provider]  levothyroxine (SYNTHROID, LEVOTHROID) 50 MCG tablet Take 50 mcg by mouth daily before breakfast.   Yes [provider]  Magnesium Oxide 250 MG TABS Take 250 mg by mouth daily.   Yes [provider]  melatonin 5 MG TABS Take 1 tablet (5 mg total) by mouth at bedtime as needed. 05/07/23  Yes Marcelino Duster, MD  Multiple Vitamins-Iron (DAILY MULTIVITAMINS/IRON PO) Take 1 tablet by mouth daily.   Yes [provider]  Multiple Vitamins-Minerals (HEALTHY EYES SUPERVISION 2) CAPS Take 1 capsule by mouth 2 (two) times daily.   Yes [provider]  nitrofurantoin (MACRODANTIN) 100 MG capsule Take 1 capsule (100 mg total) by mouth daily for 5 days. 05/25/23 05/30/23 Yes Sondra Come, MD  nystatin powder Apply 1 Application topically every 8 (eight) hours as needed (under the breasts for rash).   Yes [provider]  Olopatadine HCl 0.2 % SOLN Place 1 drop into both eyes daily as needed (Allergies).   Yes [provider]  Omega-3 Fatty Acids (SUPER OMEGA 3 EPA/DHA) 1000 MG CAPS Take 1,000 mg by mouth in the morning and at bedtime.   Yes [provider]  ondansetron (ZOFRAN) 4 MG tablet Take 1 tablet (4 mg total) by mouth every 6 (six) hours as needed for nausea. 10/20/20  Yes Esaw Grandchild A, DO  pyridOXINE (B-6) 50 MG  tablet Take 1 tablet (50 mg total) by mouth daily. 10/21/20  Yes Pennie Banter, DO  simethicone (MYLICON) 80 MG chewable tablet Chew 80 mg by mouth every 6 (six) hours as needed for flatulence.   Yes [provider]  TRADJENTA 5 MG TABS tablet Take 5 mg by mouth daily. 10/05/20  Yes [provider]  glucose blood (ONE TOUCH ULTRA TEST) test strip Use to test blood sugar once daily E11.49 06/24/15   Bedsole, Amy E, MD  magnesium hydroxide (MILK OF MAGNESIA) 400 MG/5ML suspension Take 30 mLs by mouth daily as needed for mild constipation. 10/20/20   Pennie Banter, DO  Nystatin (GERHARDT'S BUTT CREAM) CREA Apply 1 Application topically 2 (two) times daily. 05/07/23   Marcelino Duster, MD  Skin Protectants, Misc. (DIMETHICONE-ZINC OXIDE) cream Apply topically 2 (two) times daily as needed for dry skin. Please label the bottle for the vaginal area only. Patient not taking: Reported on 04/25/2023 06/29/20   Michiel Cowboy A, PA-C  Scheduled Meds:  Chlorhexidine Gluconate Cloth  6 each Topical Daily   hydrocortisone sod succinate (SOLU-CORTEF) inj  100 mg Intravenous Q12H   insulin aspart  0-6 Units Subcutaneous TID WC   sodium chloride flush  3 mL Intravenous Q12H   Continuous Infusions:  cefTRIAXone (ROCEPHIN)  IV Stopped (05/26/23  2147)   lactated ringers     norepinephrine (LEVOPHED) Adult infusion 14 mcg/min (05/27/23 1100)   vasopressin 0.02 Units/min (05/27/23 1100)   PRN Meds:.acetaminophen **OR** acetaminophen, ondansetron **OR** ondansetron (ZOFRAN) IV, polyethylene glycol  Active Hospital Problem list   See systems below  Assessment & Plan:   #1 Septic Shock ~ PRESENT ON ADMISSION - DUE TO PROTEUS BACTEREMIA              -Continue IV abx and vasopressor support              -lactate and procalcitonin trend              -reviewed with pharmacist - merrem >> rocephin IV              - Levophed and vasopressin with MAP goal 65              -RIJ - CVP  monitoring     #2 Right Ureteral Stone infection ~ s/p Right Ureteral Stent on 1/1 and stent exchange on 01/31 -Proteus infection currently Recent #ESBL E. Coli BACTEREMIA on 01/25 -Monitor fever curve -Trend WBC's & Procalcitonin -Follow cultures as above -Continue empiric Meropenem pending cultures & sensitivities -Urology following, appreciate input    #Paroxymal Atrial Fibrillation Echocardiogram 04/27/23: LVEF 50-55%, indeterminate diastolic parameters, RV systolic function mildly reduced, moderately elevated pulmonary artery systolic pressure, small pericardial effusion is present (no tamponade), moderated TR -Continuous cardiac monitoring -Maintain MAP >65 -Vasopressors as needed to maintain MAP goal  -Hold home Eliquis and anticoagulation due to hematuria post stent placement       #Acute Kidney Injury - KDIGO 3 - due to hydronephrosis with urolithiasis #NAGMA with Lactic Acidosis #Mild Hypokalemia  #Mild Hyponatremia  -Monitor I&O's / urinary output -Follow BMP -Ensure adequate renal perfusion -Avoid nephrotoxic agents as able -Replace electrolytes as indicated ~ Pharmacy following for assistance with electrolyte replacement -IV fluids   #Thrombocytopenia likely in the setting of severe sepsis -Check DIC panel -Monitor for S/Sx of bleeding -Trend CBC -Lovenox for VTE Prophylaxis  -Transfuse for Hgb <7 -Transfuse Platelets for Platelet count <10 K, <50 K with active bleeding  #Transaminitis  suspect secondary to shock versus hepatic congestion. Chronically elevated alk phos elevation similar to presentation.  -Follow LFTs   #Diabetes Mellitus Type II #Hypothyroidism -CBG's q4h; Target range of 140 to 180 -SSI -Follow ICU Hypo/Hyperglycemia protocol -Continue Synthroid   #Acute Metabolic Encephalopathy  -Treatment of sepsis and metabolic derangements as outlined above -Provide supportive care -Promote normal sleep/wake cycle and family presence -Avoid  sedating medications as able    Best practice:  Diet:  Oral Pain/Anxiety/Delirium protocol (if indicated): No VAP protocol (if indicated): Not indicated DVT prophylaxis: Contraindicated GI prophylaxis: N/A Glucose control:  SSI Yes Central venous access:  Yes, and it is still needed Arterial line:  N/A Foley:  Yes, and it is still needed Mobility:  bed rest  PT consulted: N/A Last date of multidisciplinary goals of care discussion [updated son at the bedside] Code Status:  DNR Disposition: ICU   = Goals of Care = Code Status Order: DNR/DNI  Primary Emergency Contact: Wiedel,Tim, Home Phone: [phone] Patient wishes to pursue ongoing treatment, but concurred that if deteriorated to pulselessness, patient would prefer a natural death as opposed to invasive measures such as CPR and intubation.    Critical care provider statement:   Total critical care time: 33 minutes   Performed by: Karna Christmas MD   Critical care time was  exclusive of separately billable procedures and treating other patients.   Critical care was necessary to treat or prevent imminent or life-threatening deterioration.   Critical care was time spent personally by me on the following activities: development of treatment plan with patient and/or surrogate as well as nursing, discussions with consultants, evaluation of patient's response to treatment, examination of patient, obtaining history from patient or surrogate, ordering and performing treatments and interventions, ordering and review of laboratory studies, ordering and review of radiographic studies, pulse oximetry and re-evaluation of patient's condition.    Vida Rigger, M.D.  Pulmonary & Critical Care Medicine

## 2023-05-27 NOTE — Consult Note (Signed)
Consultation Note Date: 05/27/2023   Patient Name: Jodi Reeves  DOB: May 23, 1927  MRN: [id number]  Age / Sex: 89 y.o., female  PCP: Sherol Dade, DO Referring Physician: Vida Rigger, MD  Reason for Consultation: Establishing goals of care   HPI/Brief Hospital Course: 89 y.o. female  with past medical history of A> Fib on Eliquis, HFpEF, HTN, CAD, T2DM, hypothyroidism, ovarian cancer, pulmonary hypertension admitted from [facility] on 05/25/2023 for planned cystoscopy with stent exchange.  Required admission due to becoming hypoxic, tachycardic and less responsive in post op  Recent admit 1/1-1/13 for septic shock secondary to ESBL E. Coli bacteremia with obstructive uropathy c/p cystoscopy with stent placement  Palliative medicine was consulted for assisting with goals of care conversations.  Subjective:  Extensive chart review has been completed prior to meeting patient including labs, vital signs, imaging, progress notes, orders, and available advanced directive documents from current and previous encounters.  Visited with Jodi Reeves at her bedside. She is awake, alert, pleasantly confused, able to answer some orientation questions but without capacity to engage in goals of care conversations.  She shares she is widowed, has one son-Jodi Reeves and one granddaughter. 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  No family at bedside during time of visit. Returned to bedside when son-Jodi Reeves and his wife visiting.  Introduced myself as a Publishing rights manager as a member of the palliative care team. Explained palliative medicine is specialized medical care for people living with serious illness. It focuses on providing relief from the symptoms and stress of a serious illness. The goal is to improve quality of life for both the patient and the family.   Jodi Reeves  shares Jodi Reeves has been a LTC resident at [facility] for about 2.5 years. At her baseline she is completely oriented. She requires assistance with ADL's but is very active at her facility.  We discussed patient's current illness and what it means in the larger context of patient's on-going co-morbidities. Natural disease trajectory and expectations at EOL were discussed.   We discussed she remains in shock and requiring significant amount of vasopressor support. We discussed assessment from SLP and recommendations to remain NPO at this time with consideration for further assessments.  Jodi Reeves shares he is hopeful for recovery and at this time wishes to continue with treating and current plan of care.  I discussed importance of continued conversations with family/support persons and all members of their medical team regarding overall plan of care and treatment options ensuring decisions are in alignment with patients goals of care.  All questions/concerns addressed. Emotional support provided to patient/family/support persons. PMT will continue to follow and support patient as needed.  Objective: Primary Diagnoses: Present on Admission:  (Resolved) Shock (HCC)  Type 2 diabetes mellitus with peripheral neuropathy (HCC)  Acute unilateral obstructive uropathy  Chronic heart failure with preserved ejection fraction (HFpEF) (HCC)  Hypothyroidism  AKI (acute kidney injury) (HCC)  Septic shock (HCC)   Physical Exam Constitutional:      General: She is not in acute distress.    Appearance: She is ill-appearing.  Pulmonary:     Effort: Pulmonary effort is normal. No respiratory distress.  Skin:    General: Skin is warm and dry.  Neurological:     Mental Status: She is alert. She is disoriented.     Motor: Weakness present.     Vital Signs: BP Marland Kitchen)  103/47 (BP Location: Left Leg)   Pulse (!) 118   Temp 98.1 F (36.7 C) (Oral)   Resp (!) 24   Ht 5\' 3"  (1.6 m)   Wt 78.7 kg   SpO2 94%   BMI  30.73 kg/m  Pain Scale: 0-10   Pain Score: 0-No pain   IO: Intake/output summary:  Intake/Output Summary (Last 24 hours) at 05/27/2023 1743 Last data filed at 05/27/2023 1100 Gross per 24 hour  Intake 589.72 ml  Output 136 ml  Net 453.72 ml    LBM: Last BM Date :  (PTA) Baseline Weight: Weight: 82.3 kg Most recent weight: Weight: 78.7 kg      Assessment and Plan  SUMMARY OF RECOMMENDATIONS   Time for outcomes PMT to continue to follow for ongoing needs and support  Palliative Prophylaxis:   Bowel Regimen, Delirium Protocol and Frequent Pain Assessment  Discussed With: CCM team and nursing staff   Thank you for this consult and allowing Palliative Medicine to participate in the care of Jodi Reeves. Palliative medicine will continue to follow and assist as needed.   Time Total: 75 minutes  Time spent includes: Detailed review of medical records (labs, imaging, vital signs), medically appropriate exam (mental status, respiratory, cardiac, skin), discussed with treatment team, counseling and educating patient, family and staff, documenting clinical information, medication management and coordination of care.   Signed by: Leeanne Deed, DNP, AGNP-C Palliative Medicine    Please contact Palliative Medicine Team phone at [phone] for questions and concerns.  For individual provider: See Loretha Stapler

## 2023-05-27 NOTE — Plan of Care (Signed)
   Problem: Fluid Volume: Goal: Hemodynamic stability will improve Outcome: Progressing   Problem: Clinical Measurements: Goal: Diagnostic test results will improve Outcome: Progressing Goal: Signs and symptoms of infection will decrease Outcome: Progressing   Problem: Respiratory: Goal: Ability to maintain adequate ventilation will improve Outcome: Progressing

## 2023-05-27 NOTE — Consult Note (Signed)
PHARMACY CONSULT NOTE - FOLLOW UP  Pharmacy Consult for Electrolyte Monitoring and Replacement   Recent Labs: Potassium (mmol/L)  Date Value  05/27/2023 4.3   Magnesium (mg/dL)  Date Value  16/01/9603 1.8   Calcium (mg/dL)  Date Value  54/12/8117 7.2 (L)   Albumin (g/dL)  Date Value  14/78/2956 2.1 (L)  09/26/2019 4.3   Phosphorus (mg/dL)  Date Value  21/30/8657 2.6   Sodium (mmol/L)  Date Value  05/27/2023 135  09/14/2020 139     Assessment: Patient was admitted for urologic procedure. Pharmacy consulted for electrolytes.   Goal of Therapy:  WNL  Plan:  No replacement needed. If electrolytes are stable 2/3, pharmacy will sign off.  F/u with AM labs.   Ronnald Ramp ,PharmD Clinical Pharmacist 05/27/2023 9:18 AM

## 2023-05-28 DIAGNOSIS — I4891 Unspecified atrial fibrillation: Secondary | ICD-10-CM | POA: Diagnosis not present

## 2023-05-28 DIAGNOSIS — N201 Calculus of ureter: Secondary | ICD-10-CM | POA: Diagnosis not present

## 2023-05-28 DIAGNOSIS — R6521 Severe sepsis with septic shock: Secondary | ICD-10-CM | POA: Diagnosis not present

## 2023-05-28 DIAGNOSIS — G9340 Encephalopathy, unspecified: Secondary | ICD-10-CM | POA: Diagnosis not present

## 2023-05-28 DIAGNOSIS — A419 Sepsis, unspecified organism: Secondary | ICD-10-CM | POA: Diagnosis not present

## 2023-05-28 DIAGNOSIS — I5032 Chronic diastolic (congestive) heart failure: Secondary | ICD-10-CM | POA: Diagnosis not present

## 2023-05-28 LAB — URINE CULTURE: Culture: 30000 — AB

## 2023-05-28 LAB — CBC
HCT: 30.7 % — ABNORMAL LOW (ref 36.0–46.0)
Hemoglobin: 10.7 g/dL — ABNORMAL LOW (ref 12.0–15.0)
MCH: 33.6 pg (ref 26.0–34.0)
MCHC: 34.9 g/dL (ref 30.0–36.0)
MCV: 96.5 fL (ref 80.0–100.0)
Platelets: 94 10*3/uL — ABNORMAL LOW (ref 150–400)
RBC: 3.18 MIL/uL — ABNORMAL LOW (ref 3.87–5.11)
RDW: 14.2 % (ref 11.5–15.5)
WBC: 65.9 10*3/uL (ref 4.0–10.5)
nRBC: 0.1 % (ref 0.0–0.2)

## 2023-05-28 LAB — CULTURE, BLOOD (ROUTINE X 2)

## 2023-05-28 LAB — MAGNESIUM: Magnesium: 1.7 mg/dL (ref 1.7–2.4)

## 2023-05-28 LAB — BASIC METABOLIC PANEL
Anion gap: 13 (ref 5–15)
BUN: 46 mg/dL — ABNORMAL HIGH (ref 8–23)
CO2: 18 mmol/L — ABNORMAL LOW (ref 22–32)
Calcium: 6.7 mg/dL — ABNORMAL LOW (ref 8.9–10.3)
Chloride: 104 mmol/L (ref 98–111)
Creatinine, Ser: 1.85 mg/dL — ABNORMAL HIGH (ref 0.44–1.00)
GFR, Estimated: 25 mL/min — ABNORMAL LOW (ref >60)
Glucose, Bld: 123 mg/dL — ABNORMAL HIGH (ref 70–99)
Potassium: 4.4 mmol/L (ref 3.5–5.1)
Sodium: 135 mmol/L (ref 135–145)

## 2023-05-28 LAB — GLUCOSE, CAPILLARY
Glucose-Capillary: 106 mg/dL — ABNORMAL HIGH (ref 70–99)
Glucose-Capillary: 105 mg/dL — ABNORMAL HIGH (ref 70–99)
Glucose-Capillary: 84 mg/dL (ref 70–99)
Glucose-Capillary: 103 mg/dL — ABNORMAL HIGH (ref 70–99)
Glucose-Capillary: 120 mg/dL — ABNORMAL HIGH (ref 70–99)
Glucose-Capillary: 138 mg/dL — ABNORMAL HIGH (ref 70–99)
Glucose-Capillary: 146 mg/dL — ABNORMAL HIGH (ref 70–99)

## 2023-05-28 LAB — PHOSPHORUS: Phosphorus: 4 mg/dL (ref 2.5–4.6)

## 2023-05-28 MED ORDER — MAGNESIUM SULFATE 2 GM/50ML IV SOLN
2.0000 g | Freq: Once | INTRAVENOUS | Status: AC
  Administered 2023-05-28: 2 g via INTRAVENOUS
  Filled 2023-05-28: qty 50

## 2023-05-28 MED ORDER — AMIODARONE IV BOLUS ONLY 150 MG/100ML
INTRAVENOUS | Status: AC
  Administered 2023-05-28: 150 mg via INTRAVENOUS
  Filled 2023-05-28: qty 100

## 2023-05-28 MED ORDER — HYDROMORPHONE HCL 1 MG/ML IJ SOLN: 0.2500 mg | INTRAMUSCULAR | Status: DC | PRN

## 2023-05-28 MED ORDER — HYDROMORPHONE HCL 1 MG/ML IJ SOLN
0.5000 mg | Freq: Once | INTRAMUSCULAR | Status: AC
  Administered 2023-05-28: 0.5 mg via INTRAVENOUS
  Filled 2023-05-28: qty 1

## 2023-05-28 MED ORDER — LACTATED RINGERS IV BOLUS
500.0000 mL | Freq: Once | INTRAVENOUS | Status: AC
  Administered 2023-05-28: 500 mL via INTRAVENOUS

## 2023-05-28 MED ORDER — HALOPERIDOL LACTATE 5 MG/ML IJ SOLN
2.0000 mg | Freq: Four times a day (QID) | INTRAMUSCULAR | Status: DC | PRN
  Administered 2023-05-28: 2 mg via INTRAVENOUS
  Filled 2023-05-28: qty 1

## 2023-05-28 MED ORDER — SODIUM BICARBONATE 8.4 % IV SOLN
INTRAVENOUS | Status: AC
  Administered 2023-05-28: 100 meq
  Filled 2023-05-28: qty 100

## 2023-05-28 MED ORDER — AMIODARONE IV BOLUS ONLY 150 MG/100ML: 150.0000 mg | Freq: Once | INTRAVENOUS | Status: AC

## 2023-05-28 NOTE — Plan of Care (Signed)
  Problem: Fluid Volume: Goal: Hemodynamic stability will improve 05/28/2023 0606 by Mena Goes, Chipper Oman, RN Outcome: Progressing 05/28/2023 0603 by Mena Goes, Chipper Oman, RN Outcome: Not Progressing   Problem: Clinical Measurements: Goal: Diagnostic test results will improve 05/28/2023 0606 by Mena Goes, Chipper Oman, RN Outcome: Progressing 05/28/2023 0603 by Mena Goes, Chipper Oman, RN Outcome: Progressing   Problem: Clinical Measurements: Goal: Ability to maintain clinical measurements within normal limits will improve 05/28/2023 0606 by Mena Goes, Chipper Oman, RN Outcome: Progressing 05/28/2023 0603 by Mena Goes, Chipper Oman, RN Outcome: Progressing Goal: Diagnostic test results will improve 05/28/2023 0606 by Mena Goes, Chipper Oman, RN Outcome: Progressing 05/28/2023 0603 by Mena Goes, Chipper Oman, RN Outcome: Progressing Goal: Respiratory complications will improve Outcome: Progressing Goal: Cardiovascular complication will be avoided Outcome: Progressing   Problem: Elimination: Goal: Will not experience complications related to bowel motility 05/28/2023 0606 by Mena Goes, Chipper Oman, RN Outcome: Progressing 05/28/2023 0603 by Mena Goes, Chipper Oman, RN Outcome: Progressing   Problem: Safety: Goal: Ability to remain free from injury will improve Outcome: Progressing   Problem: Skin Integrity: Goal: Risk for impaired skin integrity will decrease Outcome: Progressing   Problem: Coping: Goal: Ability to adjust to condition or change in health will improve Outcome: Progressing   Problem: Fluid Volume: Goal: Ability to maintain a balanced intake and output will improve Outcome: Progressing   Problem: Skin Integrity: Goal: Risk for impaired skin integrity will decrease 05/28/2023 0606 by Alveda Reasons, RN Outcome: Progressing 05/28/2023 0603 by Mena Goes, Chipper Oman, RN Outcome: Progressing   Problem: Tissue  Perfusion: Goal: Adequacy of tissue perfusion will improve 05/28/2023 0606 by Alveda Reasons, RN Outcome: Progressing 05/28/2023 0603 by Alveda Reasons, RN Outcome: Progressing

## 2023-05-28 NOTE — Plan of Care (Signed)
  Problem: Fluid Volume: Goal: Hemodynamic stability will improve Outcome: Not Progressing   Problem: Clinical Measurements: Goal: Diagnostic test results will improve Outcome: Not Progressing Goal: Signs and symptoms of infection will decrease Outcome: Not Progressing   Problem: Respiratory: Goal: Ability to maintain adequate ventilation will improve Outcome: Not Progressing   Problem: Fluid Volume: Goal: Hemodynamic stability will improve Outcome: Not Progressing   Problem: Clinical Measurements: Goal: Diagnostic test results will improve Outcome: Not Progressing Goal: Signs and symptoms of infection will decrease Outcome: Not Progressing   Problem: Respiratory: Goal: Ability to maintain adequate ventilation will improve Outcome: Not Progressing   Problem: Education: Goal: Knowledge of General Education information will improve Description: Including pain rating scale, medication(s)/side effects and non-pharmacologic comfort measures Outcome: Not Progressing   Problem: Health Behavior/Discharge Planning: Goal: Ability to manage health-related needs will improve Outcome: Not Progressing   Problem: Clinical Measurements: Goal: Ability to maintain clinical measurements within normal limits will improve Outcome: Not Progressing Goal: Will remain free from infection Outcome: Not Progressing Goal: Diagnostic test results will improve Outcome: Not Progressing Goal: Respiratory complications will improve Outcome: Not Progressing Goal: Cardiovascular complication will be avoided Outcome: Not Progressing   Problem: Activity: Goal: Risk for activity intolerance will decrease Outcome: Not Progressing   Problem: Nutrition: Goal: Adequate nutrition will be maintained Outcome: Not Progressing   Problem: Coping: Goal: Level of anxiety will decrease Outcome: Not Progressing   Problem: Elimination: Goal: Will not experience complications related to bowel  motility Outcome: Not Progressing Goal: Will not experience complications related to urinary retention Outcome: Not Progressing   Problem: Pain Managment: Goal: General experience of comfort will improve and/or be controlled Outcome: Not Progressing   Problem: Safety: Goal: Ability to remain free from injury will improve Outcome: Not Progressing   Problem: Skin Integrity: Goal: Risk for impaired skin integrity will decrease Outcome: Not Progressing   Problem: Education: Goal: Ability to describe self-care measures that may prevent or decrease complications (Diabetes Survival Skills Education) will improve Outcome: Not Progressing Goal: Individualized Educational Video(s) Outcome: Not Progressing   Problem: Coping: Goal: Ability to adjust to condition or change in health will improve Outcome: Not Progressing   Problem: Fluid Volume: Goal: Ability to maintain a balanced intake and output will improve Outcome: Not Progressing   Problem: Health Behavior/Discharge Planning: Goal: Ability to identify and utilize available resources and services will improve Outcome: Not Progressing Goal: Ability to manage health-related needs will improve Outcome: Not Progressing   Problem: Metabolic: Goal: Ability to maintain appropriate glucose levels will improve Outcome: Not Progressing   Problem: Nutritional: Goal: Maintenance of adequate nutrition will improve Outcome: Not Progressing Goal: Progress toward achieving an optimal weight will improve Outcome: Not Progressing   Problem: Skin Integrity: Goal: Risk for impaired skin integrity will decrease Outcome: Not Progressing   Problem: Tissue Perfusion: Goal: Adequacy of tissue perfusion will improve Outcome: Not Progressing

## 2023-05-28 NOTE — Progress Notes (Signed)
Palliative Care Progress Note, Assessment & Plan   Patient Name: Jodi Reeves       Date: 05/28/2023 DOB: 12-26-1927  Age: 89 y.o. MRN#: [id number] Attending Physician: Raechel Chute, MD Primary Care Physician: Sherol Dade, DO Admit Date: 05/25/2023  Subjective: Patient is lying in bed, asleep, but easily awakens to my presence.  She acknowledges my presence but is not able to make her wishes known.  She quickly returns to sleep.  She is fidgeting with her gown but once it is placed appropriately she leaves it alone.  No family or friends present during my visit.  HPI: 89 y.o. female  with past medical history of A> Fib on Eliquis, HFpEF, HTN, CAD, T2DM, hypothyroidism, ovarian cancer, pulmonary hypertension admitted from [facility] on 05/25/2023 for planned cystoscopy with stent exchange.  Required admission due to becoming hypoxic, tachycardic and less responsive in post op   Recent admit 1/1-1/13 for septic shock secondary to ESBL E. Coli bacteremia with obstructive uropathy c/p cystoscopy with stent placement   Palliative medicine was consulted for assisting with goals of care conversations.  Summary of counseling/coordination of care: Extensive chart review completed prior to meeting patient including labs, vital signs, imaging, progress notes, orders, and available advanced directive documents from current and previous encounters.   After reviewing the patient's chart and assessing the patient at bedside, I spoke with patient in regards to symptom management and goals of care.   I attempted to assess patient's symptoms.  She is unable to participate in pain/symptom assessment.  Nonverbal signs of distress such as brow furrowing, grimacing, moaning/groaning are not noted.  No  adjustment to [facility] needed at this time.  After visiting with the patient, spoke with her son Jorja Loa over the phone.  Brief medical update given.  Discussed that patient remains confused and unable to participate in medical decision making or goals of care discussions at this time.  I attempted to gauge Tim's understanding of his mother's current medical situation.  He shares understanding that patient is receiving medications to help support her blood pressure.  He also shares that she has had assessments addressing her ability to swallow/take in nutrition.  Counseled with Tim in regards to patient's functional, nutritional, and cognitive status coupled with her recent hospitalization and advanced age.  Reviewed that these are significant indicators of her overall prognosis. He shares understanding.  He wishes to stay the course, treat the treatable, and make no change to plan of care at this time.  DNR with limited interventions remains.  PMT will continue to follow and support patient and family throughout her hospitalization.  Physical Exam Vitals reviewed.  Constitutional:      General: She is not in acute distress.    Appearance: She is obese.  HENT:     Head: Normocephalic.     Mouth/Throat:     Mouth: Mucous membranes are moist.  Eyes:     Pupils: Pupils are equal, round, and reactive to light.  Pulmonary:     Effort: Pulmonary effort is normal.  Abdominal:     Palpations: Abdomen is soft.  Musculoskeletal:     Comments: Generalized weakness  Skin:    General:  Skin is warm and dry.  Neurological:     Mental Status: She is alert.  Psychiatric:        Mood and Affect: Mood normal.        Behavior: Behavior normal.        Thought Content: Thought content normal.        Judgment: Judgment normal.             Total Time 35 minutes   Time spent includes: Detailed review of medical records (labs, imaging, vital signs), medically appropriate exam (mental status, respiratory,  cardiac, skin), discussed with treatment team, counseling and educating patient, family and staff, documenting clinical information, medication management and coordination of care.  Samara Deist L. Bonita Quin, DNP, FNP-BC Palliative Medicine Team

## 2023-05-28 NOTE — Consult Note (Signed)
PHARMACY CONSULT NOTE  Pharmacy Consult for Electrolyte Monitoring and Replacement   Recent Labs: Potassium (mmol/L)  Date Value  05/28/2023 4.4   Magnesium (mg/dL)  Date Value  08/65/7846 1.7   Calcium (mg/dL)  Date Value  96/29/5284 6.7 (L)   Albumin (g/dL)  Date Value  13/24/4010 2.1 (L)  09/26/2019 4.3   Phosphorus (mg/dL)  Date Value  27/25/3664 4.0   Sodium (mmol/L)  Date Value  05/28/2023 135  09/14/2020 139   Assessment: Patient was admitted for urologic procedure. Pharmacy consulted for electrolytes.   Goal of Therapy:  WNL  Plan:  --Mg 1.7, magnesium sulfate 2 g IV x 1 --F/u labs tomorrow AM  Tressie Ellis 05/28/2023 7:35 AM

## 2023-05-28 NOTE — Progress Notes (Addendum)
NAME:  Jodi Reeves, MRN:  [id number], DOB:  1927/04/27, LOS: 3 ADMISSION DATE:  05/25/2023, CONSULTATION DATE:  05/25/2023 REFERRING MD:  Dr. Huel Cote, CHIEF COMPLAINT:  Septic Shock   Brief Pt Description / Synopsis:  89 y.o with recent PMHx of ESBL E. Coli Bacteremia in January 2025 from Right Ureteral Stone infection status post Right Ureteral Stent on 1/1, who is now admitted following Ureteroscopy and stent exchange on 1/31 with development of Severe Sepsis with Septic shock in the setting of Proteus Mirabilis BACTEREMIA complicated by Acute Metabolic Encephalopathy and Acute Kidney Injury.  History of Present Illness:   89 y.o with significant PMH of CAD, d CHF, HTN, GERD, HLD, hypothyroidism, TIA, persistent atrial fibrillation on Eliquis, T2DM, frequent UTIs and IBS who presented to the to the hospital on 05/25/2023 for elective definitive ureteroscopy.   Patient was recently admitted to the hospital from 01/25-01/13 with Acute Metabolic Encephalopathy in setting of Severe Sepsis due to  ESBL E. Coli BACTEREMIA due to Right Ureteral Stone infection, status post Cystoscopy and right ureteral stent placement complicated by shock.    Hospital Course: Patient underwent ureteroscopy and stent exchange. She was transferred to  PACU and hospitalist consulted for admission.  Initial vital signs showed HR of 108 beats/minute, BP mm Hg, the RR 15 breaths/minute, and the oxygen saturation 77% on HFNC and a temperature of 98.64F (37C).  Pertinent Labs/Diagnostics Findings: Na+/ K+: 135/3.7 Glucose: 178 BUN/Cr.:20/1.46 AST/ALT:355/48 WBC: 47.4 K/L Hgb/Hct:10.5/31.3 Plts: 140 PCT: 122.61  Lactic acid: 5.9~7.0~5.6 troponin: 43  BNP: 256.8  CXR>see result below Patient given 30 cc/kg of fluids and started on broad-spectrum for urosepsis with shock. Patient remained hypotensive despite IVF boluses therefore was started on Levophed.   Please see "Significant Hospital Events" section below for full  detailed hospital course.   Pertinent  Medical History  CAD, d CHF, HTN, GERD, HLD, hypothyroidism, TIA, persistent atrial fibrillation on Eliquis, T2DM, frequent UTIs and IBS   Micro Data:  1/31: Blood cultures>>PROTEUS MIRABILIS  1/31: Urine>>PROTEUS MIRABILIS & ENTEROCOCCUS FAECIUM  1/31: MRSA PCR>>negative  Antimicrobials:   Anti-infectives (From admission, onward)    Start     Dose/Rate Route Frequency Ordered Stop   05/26/23 2200  cefTRIAXone (ROCEPHIN) 2 g in sodium chloride 0.9 % 100 mL IVPB        2 g 200 mL/hr over 30 Minutes Intravenous Every 24 hours 05/26/23 1227     05/25/23 1800  meropenem (MERREM) 1 g in sodium chloride 0.9 % 100 mL IVPB  Status:  Discontinued        1 g 200 mL/hr over 30 Minutes Intravenous Every 12 hours 05/25/23 1626 05/26/23 1227   05/25/23 0954  ciprofloxacin (CIPRO) IVPB 400 mg        400 mg 200 mL/hr over 60 Minutes Intravenous 60 min pre-op 05/25/23 0954 05/25/23 1230   05/25/23 0000  nitrofurantoin (MACRODANTIN) 100 MG capsule        100 mg Oral Daily 05/25/23 1247 05/30/23 2359       Significant Hospital Events: Including procedures, antibiotic start and stop dates in addition to other pertinent events   05/25/23: Admit to ICU with severe urosepsis with septic shock secondary to ureteral stone s/p stent placement  05/26/23- patient ripped out her central line while moving around.  This was discussed with family and replaced due to need for high dose levophed and infusion of vasopressin.  She is clinically improved today and more awake more alert.  I  met with son Jorja Loa and granddaughter at bedside we reviewed bloodwork and imaging.   05/27/23- patient presistent on levophed/vasopressin.  Increased IVF and added solucortef this am.  05/28/23- Remains critically ill on Levophed and Vasopressin. Remains encephalopathic.  AKI and metabolic acidosis slightly improved  Palliative Care following, family confirms DNR/DNI status and wishes to continue  current measures and allow time for outcomes.  Speech evaluation pending.  Interim History / Subjective:  -As outlined above in significant hospital events section   Objective   Blood pressure (!) 127/97, pulse (!) 134, temperature 98.1 F (36.7 C), temperature source Oral, resp. rate (!) 26, height 5\' 3"  (1.6 m), weight 78.7 kg, SpO2 97%. CVP:  [4 mmHg-16 mmHg] 9 mmHg      Intake/Output Summary (Last 24 hours) at 05/28/2023 0745 Last data filed at 05/28/2023 8295 Gross per 24 hour  Intake 497.04 ml  Output 210 ml  Net 287.04 ml   Filed Weights   05/25/23 1011 05/25/23 1950  Weight: 82.3 kg 78.7 kg    Examination: General: Acute on chronically ill-appearing frail elderly female, laying in bed, restless, no acute distress HENT: Atraumatic, normocephalic, neck supple, no JVD Lungs: Coarse breath sounds throughout, even, nonlabored, normal effort Cardiovascular: Tachycardia, regular rhythm, S1-S2, no murmurs, rubs, gallops Abdomen: Soft, nontender, nondistended, no guarding or rebound tenderness, bowel sounds positive x 4 Extremities: Generalized weakness, normal bulk and tone, no deformities, trace edema bilateral lower extremities Neuro: Awake and alert, oriented only to self, moving all extremities but not command, no focal deficits noted, pupils PERRLA GU: Foley catheter in place  Resolved Hospital Problem list     Assessment & Plan:   #Septic Shock #Paroxsymal Atrial Fibrillation #Mildly elevated troponin, suspect demand ischemia Echocardiogram 04/27/23: LVEF 50-55%, indeterminate diastolic parameters, RV systolic function mildly reduced, moderately elevated pulmonary artery systolic pressure, moderate Tricuspid regurgitation -Continuous cardiac monitoring -Maintain MAP >65 -Gentle IV fluids -Vasopressors as needed to maintain MAP goal -Continue stress dose steroids -Trend lactic acid until normalized -Trend HS Troponin until peaked (41 ~ 43) -Holding AC due to  hematuria  #Severe Sepsis #Proteus Mirabilis BACTEREMIA #Right Ureteral Stone infection ~ s/p Right Ureteral Stent on 1/1 and stent exchange on 01/31  Recent ESBL E. Coli BACTEREMIA on 01/25  -Monitor fever curve -Trend WBC's & Procalcitonin -Follow cultures as above -Continue empiric Ceftriaxone pending cultures & sensitivities  #Acute Kidney Injury - KDIGO 3 - due to hydronephrosis with urolithiasis #AG Metabolic Acidosis #Lactic acidosis #Mild Hypokalemia  #Mild Hyponatremia  -Monitor I&O's / urinary output -Follow BMP -Ensure adequate renal perfusion -Avoid nephrotoxic agents as able -Replace electrolytes as indicated ~ Pharmacy following for assistance with electrolyte replacement -Gentle IV fluids   #Anemia without s/sx of bleeding #Thrombocytopenia likely in the setting of severe sepsis -Monitor for S/Sx of bleeding -Trend CBC -SCD for VTE Prophylaxis (holding chemical ppx due to hematuria) -Transfuse for Hgb <7 -Transfuse Platelets for Platelet count <10 K, <50 K with active bleeding -DIC panel negative   #Transaminitis  suspect secondary to shock versus hepatic congestion. Chronically elevated alk phos elevation similar to presentation.  -Follow LFTs   #Diabetes Mellitus Type II #Hypothyroidism -CBG's q4h; Target range of 140 to 180 -SSI -Follow ICU Hypo/Hyperglycemia protocol -Continue Synthroid   #Acute Metabolic Encephalopathy  -Treatment of sepsis and metabolic derangements as outlined above -Provide supportive care -Promote normal sleep/wake cycle and family presence -Avoid sedating medications as able     Patient is critically ill with severe septic shock with  bacteremia and multiorgan failure.  Prognosis is extremely guarded, high risk for further decompensation, cardiac arrest and death.  Given current critical illness superimposed on advanced age and multiple chronic comorbidities, overall long-term prognosis is extremely poor.  Patient is  DNR/DNI, recommend consideration for transition to comfort measures.  Palliative care is following for ongoing goals of care conversations.   Best Practice (right click and "Reselect all SmartList Selections" daily)   Diet/type: NPO DVT prophylaxis: SCD GI prophylaxis: N/A Lines: Central line and yes and it is still needed Foley:  Yes, and it is still needed Code Status:  DNR Last date of multidisciplinary goals of care discussion [2/3]  2/3: Will update pt's family when they arrive at bedside.  Labs   CBC: Recent Labs  Lab 05/25/23 1629 05/25/23 2348 05/26/23 0505 05/27/23 0622 05/28/23 0428  WBC 8.4 47.4* 60.9* 84.3* 65.9*  NEUTROABS 7.8* 44.2*  --   --   --   HGB 10.9* 10.5* 11.1* 10.8* 10.7*  HCT 32.3* 31.3* 33.5* 32.0* 30.7*  MCV 97.6 99.1 101.2* 100.0 96.5  PLT 134* 140* 135* 108* 94*    Basic Metabolic Panel: Recent Labs  Lab 05/25/23 1629 05/25/23 2348 05/26/23 0505 05/27/23 0622 05/28/23 0428  NA 137 135 134* 135 135  K 3.4* 3.7 3.8 4.3 4.4  CL 101 102 102 101 104  CO2 21* 19* 18* 16* 18*  GLUCOSE 118* 178* 209* 106* 123*  BUN 20 20 21  35* 46*  CREATININE 1.17* 1.46* 1.60* 2.24* 1.85*  CALCIUM 7.8* 7.3* 7.2* 7.2* 6.7*  MG  --   --   --   --  1.7  PHOS  --   --   --   --  4.0   GFR: Estimated Creatinine Clearance: 18.9 mL/min (A) (by C-G formula based on SCr of 1.85 mg/dL (H)). Recent Labs  Lab 05/25/23 1958 05/25/23 2253 05/25/23 2348 05/26/23 0229 05/26/23 0505 05/27/23 0622 05/28/23 0428  PROCALCITON 122.61  --   --   --   --   --   --   WBC  --   --  47.4*  --  60.9* 84.3* 65.9*  LATICACIDVEN 7.0* 5.6*  --  6.4* 6.5*  --   --     Liver Function Tests: Recent Labs  Lab 05/25/23 1629 05/26/23 0505 05/27/23 0622  AST 355* 253* 136*  ALT 48* 47* 35  ALKPHOS 313* 242* 213*  BILITOT 2.3* 2.7* 1.4*  PROT 6.2* 5.4* 5.9*  ALBUMIN 2.2* 1.9* 2.1*   No results for input(s): "LIPASE", "AMYLASE" in the last 168 hours. No results for  input(s): "AMMONIA" in the last 168 hours.  ABG    Component Value Date/Time   PHART 7.39 04/26/2023 2028   PCO2ART 24 (L) 04/26/2023 2028   PO2ART 73 (L) 04/26/2023 2028   HCO3 14.5 (L) 04/26/2023 2028   ACIDBASEDEF 8.6 (H) 04/26/2023 2028   O2SAT 97.8 04/26/2023 2028     Coagulation Profile: Recent Labs  Lab 05/26/23 0246  INR 3.9*    Cardiac Enzymes: No results for input(s): "CKTOTAL", "CKMB", "CKMBINDEX", "TROPONINI" in the last 168 hours.  HbA1C: Hgb A1c MFr Bld  Date/Time Value Ref Range Status  04/26/2023 05:06 AM 8.0 (H) 4.8 - 5.6 % Final    Comment:    (NOTE)         Prediabetes: 5.7 - 6.4         Diabetes: >6.4         Glycemic control for  adults with diabetes: <7.0   10/10/2020 08:10 AM 7.8 (H) 4.8 - 5.6 % Final    Comment:    (NOTE)         Prediabetes: 5.7 - 6.4         Diabetes: >6.4         Glycemic control for adults with diabetes: <7.0     CBG: Recent Labs  Lab 05/27/23 1949 05/27/23 2145 05/27/23 2352 05/28/23 0341 05/28/23 0729  GLUCAP 82 90 94 106* 105*    Review of Systems:   Unable to assess due to AMS   Past Medical History:  She,  has a past medical history of (HFpEF) heart failure with preserved ejection fraction (HCC), Allergy, Anxiety, Aortic atherosclerosis (HCC), B12 deficiency, Balance disorder, Cardiomegaly, Colon polyps, Coronary artery disease, non-occlusive, DDD (degenerative disc disease), lumbar, DM2 (diabetes mellitus, type 2) (HCC), Dysphagia, Esophageal obstruction, Esophageal web, Falls frequently, GERD (gastroesophageal reflux disease), Granuloma annulare (2010), History of chicken pox, History of eating disorder, HLD (hyperlipidemia), HTN (hypertension), Hypothyroidism, Incontinence of bowel, Insomnia, Irritable bowel syndrome with diarrhea, Lymphedema of both lower extremities, Nephrolithiasis, On apixaban therapy, Osteopenia, Osteoporosis, Ovarian cancer (HCC) (1980), Persistent atrial fibrillation (HCC) (2005),  Poor memory, Pulmonary hypertension (HCC), SBO (small bowel obstruction) (HCC) (1998), Scoliosis, TIA (transient ischemic attack), Venous insufficiency, and Ventral hernia.   Surgical History:   Past Surgical History:  Procedure Laterality Date   2nd look laparotomy  1982   ABDOMINAL HYSTERECTOMY     for ovarian cancer s/p hysterectomy total 1976 and exp lap 1980   APPENDECTOMY     CARDIAC CATHETERIZATION  11/2003   neg; a fib found    CARPAL TUNNEL RELEASE Left 03/06/2018   Procedure: CARPAL TUNNEL RELEASE ENDOSCOPIC;  Surgeon: Christena Flake, MD;  Location: [facility];  Service: Orthopedics;  Laterality: Left;  diabetic - oral meds   CARPAL TUNNEL RELEASE Left    Procedure: CARPAL TUNNEL RELEASE; Location: [facility]; Surgeon: Leron Croak, MD   CATARACT EXTRACTION Right 08/2005   cataract OD  2002   CHOLECYSTECTOMY  1986   CYSTOSCOPY WITH RETROGRADE PYELOGRAM, URETEROSCOPY AND STENT PLACEMENT Right 04/25/2023   Procedure: CYSTOSCOPY WITH RETROGRADE PYELOGRAM, URETEROSCOPY AND STENT PLACEMENT;  Surgeon: Jannifer Hick, MD;  Location: [facility];  Service: Urology;  Laterality: Right;   CYSTOSCOPY/URETEROSCOPY/HOLMIUM LASER/STENT PLACEMENT Right 05/25/2023   Procedure: CYSTOSCOPY/URETEROSCOPY/STENT EXCHANGE;  Surgeon: Sondra Come, MD;  Location: [facility];  Service: Urology;  Laterality: Right;   DEXA  9/04 and 1/02   EGD/dilation/colon  04/2004   ESOPHAGOGASTRODUODENOSCOPY (EGD) WITH PROPOFOL N/A 05/16/2016   Procedure: ESOPHAGOGASTRODUODENOSCOPY (EGD) WITH PROPOFOL with dilation;  Surgeon: Midge Minium, MD;  Location: [facility];  Service: Endoscopy;  Laterality: N/A;   FEMORAL HERNIA REPAIR Right 1958   fracture L elbow/wrist  2000   INTRAMEDULLARY (IM) NAIL INTERTROCHANTERIC Left 10/12/2020   Procedure: INTRAMEDULLARY (IM) NAIL INTERTROCHANTRIC;  Surgeon: Juanell Fairly, MD;  Location: [facility];  Service: Orthopedics;  Laterality: Left;   intussception/obstruction  03/1997    kidney stone x2  1991   KYPHOPLASTY N/A 04/13/2020   Procedure: [id number];  Surgeon: Kennedy Bucker, MD;  Location: [facility];  Service: Orthopedics;  Laterality: N/A;   KYPHOPLASTY N/A 05/18/2020   Procedure: L1 KYPHOPLASTY;  Surgeon: Kennedy Bucker, MD;  Location: [facility];  Service: Orthopedics;  Laterality: N/A;   KYPHOPLASTY N/A 07/06/2020   Procedure: L4  KYPHOPLASTY;  Surgeon: Kennedy Bucker, MD;  Location: [facility];  Service: Orthopedics;  Laterality: N/A;  laminectomy L4-5  1971   LUMBAR LAMINECTOMY     1970 ruptured disc    MOUTH SURGERY     myoview stress  11/2003   syncope (-)   REVERSE SHOULDER ARTHROPLASTY Left 10/06/2014   Procedure: REVERSE SHOULDER ARTHROPLASTY;  Surgeon: Christena Flake, MD;  Location: [facility];  Service: Orthopedics;  Laterality: Left;   stillbirth  1969   TOTAL ABDOMINAL HYSTERECTOMY W/ BILATERAL SALPINGOOPHORECTOMY  1980   ovarian cancer   TOTAL SHOULDER ARTHROPLASTY Left    WRIST FRACTURE SURGERY Right 02/2004     Social History:   reports that she has never smoked. She has never used smokeless tobacco. She reports that she does not drink alcohol and does not use drugs.   Family History:  Her family history includes Arthritis in her brother, brother, and sister; COPD in her brother; Cancer in her brother, brother, and brother; Depression in her brother and sister; Diabetes in her brother, mother, and paternal grandfather; Early death in her brother, brother, and father; Hearing loss in her brother, brother, brother, daughter, sister, and sister; Heart disease in her brother, mother, sister, sister, and sister; Hyperlipidemia in her brother; Hypertension in her daughter and sister; Stroke in her sister.   Allergies Allergies  Allergen Reactions   Amiodarone Other (See Comments)    Severe Thyroid issues    Fosamax [Alendronate]     dysphagia   Penicillins Anaphylaxis and Other (See Comments)    Tolerated 1st generation cephalosporin  (CEFAZOLIN) 07/06/2020, 10/12/2020, 04/25/2023)   Sulfa Antibiotics Itching and Rash    Itching    Actos [Pioglitazone]     Not effective    Amaryl [Glimepiride]     Not effective     Atorvastatin Other (See Comments)    Muscle aches & All statins per Patient   Bentyl [Dicyclomine Hcl]     Could not tolerate nausea, reduced concentration, h/a    Ciprofloxacin Other (See Comments)    High blood pressure     Codeine Nausea And Vomiting   Ezetimibe-Simvastatin Other (See Comments)    Muscle aches, nausea, back pain    Lovastatin Other (See Comments)    Myalgias   Macrobid [Nitrofurantoin Macrocrystal]     Severe Itching, rash    Protonix [Pantoprazole Sodium]     Esophageal problems  Wants removed from list   Rosiglitazone Maleate Other (See Comments)    Edema   Statins     Muscle and joint aches to all statins   Victoza [Liraglutide]     Nausea    Alendronate Sodium Rash    Dysphagia and ulceration    Bactrim [Sulfamethoxazole-Trimethoprim] Rash    itching     Home Medications  Prior to Admission medications   Medication Sig Start Date End Date Taking? Authorizing Provider  acetaminophen (TYLENOL) 500 MG tablet Take 1,000 mg by mouth every 6 (six) hours as needed for moderate pain (pain score 4-6).   Yes [provider]  alum & mag hydroxide-simeth (MAALOX/MYLANTA) 200-200-20 MG/5ML suspension Take 30 mLs by mouth every 4 (four) hours as needed for indigestion. 10/20/20  Yes Pennie Banter, DO  apixaban (ELIQUIS) 5 MG TABS tablet Take 5 mg by mouth 2 (two) times daily.   Yes [provider]  Ascorbic Acid (VITAMIN C) 1000 MG tablet Take 2,000 mg by mouth daily.   Yes [provider]  bisoprolol (ZEBETA) 5 MG tablet Take 5 mg by mouth every 12 (twelve) hours.   Yes [provider]  CALCIUM ANTACID EXTRA STRENGTH 750 MG chewable tablet Chew 2 tablets by mouth daily as needed (indigestion). 08/22/21  Yes [provider]   Cyanocobalamin (VITAMIN B-12) 5000 MCG LOZG Take 5,000 mcg by mouth daily.   Yes [provider]  diclofenac Sodium (VOLTAREN) 1 % GEL Apply 2 g topically in the morning, at noon, and at bedtime.   Yes [provider]  docusate sodium (COLACE) 100 MG capsule Take 100 mg by mouth daily as needed for mild constipation.   Yes [provider]  estradiol (ESTRACE) 0.1 MG/GM vaginal cream APPLY 0.5MG  (PEA SIZED AMOUNT) JUST INSIDE THE VAGINA WITH FINGER-TIP ON MONDAY, WEDNESDAY AND FRIDAY NIGHTS. 07/21/20  Yes McGowan, Carollee Herter A, PA-C  famotidine (PEPCID) 20 MG tablet Take 20 mg by mouth at bedtime. 12/29/21  Yes [provider]  fluticasone (FLONASE) 50 MCG/ACT nasal spray Place 1 spray into both nostrils daily.   Yes [provider]  folic acid (FOLVITE) 1 MG tablet Take 1 tablet (1 mg total) by mouth daily. 10/21/20  Yes Esaw Grandchild A, DO  furosemide (LASIX) 20 MG tablet Take 1 tablet (20 mg total) by mouth daily. 05/07/23  Yes Marcelino Duster, MD  hydrOXYzine (ATARAX) 25 MG tablet Take 25 mg by mouth every 8 (eight) hours as needed for anxiety.   Yes [provider]  insulin aspart (NOVOLOG) 100 UNIT/ML injection Inject 0-15 Units into the skin 4 (four) times daily -  before meals and at bedtime. Patient taking differently: Inject 0-12 Units into the skin 2 (two) times daily. SS:200-249=2u, 250-299=4u, 300-349=6u, 350-399=8u, 400-449=10u, 450-499=12u, if 500 or greater= CALL MD 10/20/20  Yes Esaw Grandchild A, DO  insulin glargine-yfgn (SEMGLEE, YFGN,) 100 UNIT/ML injection Inject 8 Units into the skin at bedtime.   Yes [provider]  levothyroxine (SYNTHROID, LEVOTHROID) 50 MCG tablet Take 50 mcg by mouth daily before breakfast.   Yes [provider]  Magnesium Oxide 250 MG TABS Take 250 mg by mouth daily.   Yes [provider]  melatonin 5 MG TABS Take 1 tablet (5 mg total) by mouth at bedtime as needed. 05/07/23   Yes Marcelino Duster, MD  Multiple Vitamins-Iron (DAILY MULTIVITAMINS/IRON PO) Take 1 tablet by mouth daily.   Yes [provider]  Multiple Vitamins-Minerals (HEALTHY EYES SUPERVISION 2) CAPS Take 1 capsule by mouth 2 (two) times daily.   Yes [provider]  nitrofurantoin (MACRODANTIN) 100 MG capsule Take 1 capsule (100 mg total) by mouth daily for 5 days. 05/25/23 05/30/23 Yes Sondra Come, MD  nystatin powder Apply 1 Application topically every 8 (eight) hours as needed (under the breasts for rash).   Yes [provider]  Olopatadine HCl 0.2 % SOLN Place 1 drop into both eyes daily as needed (Allergies).   Yes [provider]  Omega-3 Fatty Acids (SUPER OMEGA 3 EPA/DHA) 1000 MG CAPS Take 1,000 mg by mouth in the morning and at bedtime.   Yes [provider]  ondansetron (ZOFRAN) 4 MG tablet Take 1 tablet (4 mg total) by mouth every 6 (six) hours as needed for nausea. 10/20/20  Yes Esaw Grandchild A, DO  pyridOXINE (B-6) 50 MG tablet Take 1 tablet (50 mg total) by mouth daily. 10/21/20  Yes Pennie Banter, DO  simethicone (MYLICON) 80 MG chewable tablet Chew 80 mg by mouth every 6 (six) hours as needed for flatulence.   Yes [provider]  TRADJENTA 5 MG TABS tablet Take 5 mg  by mouth daily. 10/05/20  Yes [provider]  glucose blood (ONE TOUCH ULTRA TEST) test strip Use to test blood sugar once daily E11.49 06/24/15   Bedsole, Amy E, MD  magnesium hydroxide (MILK OF MAGNESIA) 400 MG/5ML suspension Take 30 mLs by mouth daily as needed for mild constipation. 10/20/20   Pennie Banter, DO  Nystatin (GERHARDT'S BUTT CREAM) CREA Apply 1 Application topically 2 (two) times daily. 05/07/23   Marcelino Duster, MD  Skin Protectants, Misc. (DIMETHICONE-ZINC OXIDE) cream Apply topically 2 (two) times daily as needed for dry skin. Please label the bottle for the vaginal area only. Patient not taking: Reported on 04/25/2023 06/29/20    Harle Battiest, PA-C     Critical care time: 40 minutes     Harlon Ditty, AGACNP-BC [facility] Prefer epic messenger for cross cover needs If after hours, please call E-link

## 2023-05-29 DIAGNOSIS — R6521 Severe sepsis with septic shock: Secondary | ICD-10-CM | POA: Diagnosis not present

## 2023-05-29 DIAGNOSIS — N139 Obstructive and reflux uropathy, unspecified: Secondary | ICD-10-CM | POA: Diagnosis not present

## 2023-05-29 DIAGNOSIS — I5032 Chronic diastolic (congestive) heart failure: Secondary | ICD-10-CM | POA: Diagnosis not present

## 2023-05-29 DIAGNOSIS — I4891 Unspecified atrial fibrillation: Secondary | ICD-10-CM | POA: Diagnosis not present

## 2023-05-29 DIAGNOSIS — A419 Sepsis, unspecified organism: Secondary | ICD-10-CM | POA: Diagnosis not present

## 2023-05-29 DIAGNOSIS — N201 Calculus of ureter: Secondary | ICD-10-CM | POA: Diagnosis not present

## 2023-05-29 LAB — BLOOD GAS, ARTERIAL
Acid-base deficit: 11.3 mmol/L — ABNORMAL HIGH (ref 0.0–2.0)
Bicarbonate: 11.6 mmol/L — ABNORMAL LOW (ref 20.0–28.0)
O2 Content: 10 L/min
O2 Saturation: 95.6 %
Patient temperature: 37
pCO2 arterial: 20 mm[Hg] — ABNORMAL LOW (ref 32–48)
pH, Arterial: 7.37 (ref 7.35–7.45)
pO2, Arterial: 69 mm[Hg] — ABNORMAL LOW (ref 83–108)

## 2023-05-29 LAB — LACTIC ACID, PLASMA: Lactic Acid, Venous: 7.6 mmol/L (ref 0.5–1.9)

## 2023-05-29 LAB — CBC
HCT: 30 % — ABNORMAL LOW (ref 36.0–46.0)
Hemoglobin: 10.5 g/dL — ABNORMAL LOW (ref 12.0–15.0)
MCH: 33.7 pg (ref 26.0–34.0)
MCHC: 35 g/dL (ref 30.0–36.0)
MCV: 96.2 fL (ref 80.0–100.0)
Platelets: 78 10*3/uL — ABNORMAL LOW (ref 150–400)
RBC: 3.12 MIL/uL — ABNORMAL LOW (ref 3.87–5.11)
RDW: 14.8 % (ref 11.5–15.5)
WBC: 38.2 10*3/uL — ABNORMAL HIGH (ref 4.0–10.5)
nRBC: 0.3 % — ABNORMAL HIGH (ref 0.0–0.2)

## 2023-05-29 LAB — BASIC METABOLIC PANEL
Anion gap: 20 — ABNORMAL HIGH (ref 5–15)
BUN: 62 mg/dL — ABNORMAL HIGH (ref 8–23)
CO2: 15 mmol/L — ABNORMAL LOW (ref 22–32)
Calcium: 6.7 mg/dL — ABNORMAL LOW (ref 8.9–10.3)
Chloride: 103 mmol/L (ref 98–111)
Creatinine, Ser: 2.2 mg/dL — ABNORMAL HIGH (ref 0.44–1.00)
GFR, Estimated: 20 mL/min — ABNORMAL LOW (ref >60)
Glucose, Bld: 175 mg/dL — ABNORMAL HIGH (ref 70–99)
Potassium: 4.9 mmol/L (ref 3.5–5.1)
Sodium: 138 mmol/L (ref 135–145)

## 2023-05-29 LAB — PHOSPHORUS: Phosphorus: 4.7 mg/dL — ABNORMAL HIGH (ref 2.5–4.6)

## 2023-05-29 LAB — MAGNESIUM: Magnesium: 2.2 mg/dL (ref 1.7–2.4)

## 2023-05-29 LAB — GLUCOSE, CAPILLARY
Glucose-Capillary: 140 mg/dL — ABNORMAL HIGH (ref 70–99)
Glucose-Capillary: 168 mg/dL — ABNORMAL HIGH (ref 70–99)

## 2023-05-29 MED ORDER — POLYVINYL ALCOHOL 1.4 % OP SOLN: 1.0000 [drp] | Freq: Four times a day (QID) | OPHTHALMIC | Status: DC | PRN

## 2023-05-29 MED ORDER — ACETAMINOPHEN 325 MG PO TABS: 650.0000 mg | ORAL_TABLET | Freq: Four times a day (QID) | ORAL | Status: DC | PRN

## 2023-05-29 MED ORDER — STERILE WATER FOR INJECTION IV SOLN
INTRAVENOUS | Status: DC
  Filled 2023-05-29: qty 150

## 2023-05-29 MED ORDER — HYDROMORPHONE HCL-NACL 50-0.9 MG/50ML-% IV SOLN
0.0000 mg/h | INTRAVENOUS | Status: DC
  Administered 2023-05-29: 1 mg/h via INTRAVENOUS
  Filled 2023-05-29: qty 50

## 2023-05-29 MED ORDER — HALOPERIDOL LACTATE 5 MG/ML IJ SOLN: 2.5000 mg | INTRAMUSCULAR | Status: DC | PRN

## 2023-05-29 MED ORDER — AMIODARONE HCL IN DEXTROSE 360-4.14 MG/200ML-% IV SOLN: 30.0000 mg/h | INTRAVENOUS | Status: DC

## 2023-05-29 MED ORDER — LORAZEPAM 2 MG/ML IJ SOLN
2.0000 mg | INTRAMUSCULAR | Status: DC | PRN
  Administered 2023-05-29: 2 mg via INTRAVENOUS
  Filled 2023-05-29: qty 1

## 2023-05-29 MED ORDER — AMIODARONE IV BOLUS ONLY 150 MG/100ML
INTRAVENOUS | Status: AC
  Administered 2023-05-29: 150 mg
  Filled 2023-05-29: qty 100

## 2023-05-29 MED ORDER — GLYCOPYRROLATE 0.2 MG/ML IJ SOLN: 0.2000 mg | INTRAMUSCULAR | Status: DC | PRN

## 2023-05-29 MED ORDER — ACETAMINOPHEN 10 MG/ML IV SOLN
1000.0000 mg | Freq: Once | INTRAVENOUS | Status: AC
  Administered 2023-05-29: 1000 mg via INTRAVENOUS
  Filled 2023-05-29: qty 100

## 2023-05-29 MED ORDER — ACETAMINOPHEN 650 MG RE SUPP: 650.0000 mg | Freq: Four times a day (QID) | RECTAL | Status: DC | PRN

## 2023-05-29 MED ORDER — HYDROMORPHONE BOLUS VIA INFUSION
1.0000 mg | INTRAVENOUS | Status: DC | PRN
  Administered 2023-05-29: 1 mg via INTRAVENOUS

## 2023-05-29 MED ORDER — AMIODARONE LOAD VIA INFUSION
150.0000 mg | Freq: Once | INTRAVENOUS | Status: DC
  Filled 2023-05-29: qty 83.34

## 2023-05-29 MED ORDER — SODIUM CHLORIDE 0.9 % IV SOLN: INTRAVENOUS | Status: DC

## 2023-05-29 MED ORDER — AMIODARONE HCL IN DEXTROSE 360-4.14 MG/200ML-% IV SOLN
60.0000 mg/h | INTRAVENOUS | Status: DC
  Administered 2023-05-29: 60 mg/h via INTRAVENOUS
  Filled 2023-05-29: qty 200

## 2023-05-29 MED ORDER — GLYCOPYRROLATE 1 MG PO TABS: 1.0000 mg | ORAL_TABLET | ORAL | Status: DC | PRN

## 2023-05-29 MED ORDER — HYDROMORPHONE HCL 1 MG/ML IJ SOLN
0.5000 mg | Freq: Once | INTRAMUSCULAR | Status: AC
  Administered 2023-05-29: 0.5 mg via INTRAVENOUS
  Filled 2023-05-29: qty 1

## 2023-06-23 NOTE — Plan of Care (Signed)
  Problem: Clinical Measurements: Goal: Signs and symptoms of infection will decrease Outcome: Progressing   Problem: Respiratory: Goal: Ability to maintain adequate ventilation will improve Outcome: Progressing   Problem: Skin Integrity: Goal: Risk for impaired skin integrity will decrease Outcome: Progressing   Problem: Clinical Measurements: Goal: Diagnostic test results will improve Outcome: Progressing Goal: Signs and symptoms of infection will decrease Outcome: Progressing   Problem: Respiratory: Goal: Ability to maintain adequate ventilation will improve Outcome: Progressing   Problem: Fluid Volume: Goal: Hemodynamic stability will improve Outcome: Not Progressing   Problem: Nutrition: Goal: Adequate nutrition will be maintained Outcome: Not Progressing

## 2023-06-23 NOTE — TOC Progression Note (Signed)
 Transition of Care [facility]) - Progression Note    Patient Details  Name: SUAD AUTREY MRN: [id number] Date of Birth: 1928-02-21  [facility] CM/SW Contact  Tomasa JAYSON Childes, RN Phone Number: 2023-06-11, 11:26 AM  Clinical Narrative:    Beatris with Barnie, Admissions Coordinator at [facility]. Patient will require authorization prior for STR prior to her return the facility. Facility is starting auth. MD and nurse notified.         Expected Discharge Plan and Services         Expected Discharge Date: 05/25/23                                     Social Determinants of Health (SDOH) Interventions SDOH Screenings   Food Insecurity: No Food Insecurity (05/25/2023)  Housing: Unknown (05/25/2023)  Transportation Needs: No Transportation Needs (05/25/2023)  Utilities: Not At Risk (05/25/2023)  Depression (PHQ2-9): Low Risk  (06/21/2021)  Financial Resource Strain: Low Risk  (05/21/2023)   Received from [facility]  Physical Activity: Insufficiently Active (06/18/2020)  Social Connections: Unknown (05/25/2023)  Stress: No Stress Concern Present (06/21/2021)  Tobacco Use: Low Risk  (05/25/2023)    Readmission Risk Interventions     No data to display

## 2023-06-23 NOTE — Plan of Care (Signed)
  Problem: Fluid Volume: Goal: Hemodynamic stability will improve Outcome: Not Progressing   Problem: Clinical Measurements: Goal: Diagnostic test results will improve Outcome: Not Progressing Goal: Signs and symptoms of infection will decrease Outcome: Not Progressing   Problem: Respiratory: Goal: Ability to maintain adequate ventilation will improve Outcome: Not Progressing   Problem: Fluid Volume: Goal: Hemodynamic stability will improve Outcome: Not Progressing   Problem: Clinical Measurements: Goal: Diagnostic test results will improve Outcome: Not Progressing Goal: Signs and symptoms of infection will decrease Outcome: Not Progressing   Problem: Respiratory: Goal: Ability to maintain adequate ventilation will improve Outcome: Not Progressing   Problem: Education: Goal: Knowledge of General Education information will improve Description: Including pain rating scale, medication(s)/side effects and non-pharmacologic comfort measures Outcome: Not Progressing   Problem: Health Behavior/Discharge Planning: Goal: Ability to manage health-related needs will improve Outcome: Not Progressing   Problem: Clinical Measurements: Goal: Ability to maintain clinical measurements within normal limits will improve Outcome: Not Progressing Goal: Will remain free from infection Outcome: Not Progressing Goal: Diagnostic test results will improve Outcome: Not Progressing Goal: Respiratory complications will improve Outcome: Not Progressing Goal: Cardiovascular complication will be avoided Outcome: Not Progressing   Problem: Activity: Goal: Risk for activity intolerance will decrease Outcome: Not Progressing   Problem: Nutrition: Goal: Adequate nutrition will be maintained Outcome: Not Progressing   Problem: Coping: Goal: Level of anxiety will decrease Outcome: Not Progressing   Problem: Elimination: Goal: Will not experience complications related to bowel  motility Outcome: Not Progressing Goal: Will not experience complications related to urinary retention Outcome: Not Progressing   Problem: Pain Managment: Goal: General experience of comfort will improve and/or be controlled Outcome: Not Progressing   Problem: Safety: Goal: Ability to remain free from injury will improve Outcome: Not Progressing   Problem: Skin Integrity: Goal: Risk for impaired skin integrity will decrease Outcome: Not Progressing   Problem: Coping: Goal: Ability to adjust to condition or change in health will improve Outcome: Not Progressing   Problem: Fluid Volume: Goal: Ability to maintain a balanced intake and output will improve Outcome: Not Progressing   Problem: Health Behavior/Discharge Planning: Goal: Ability to identify and utilize available resources and services will improve Outcome: Not Progressing Goal: Ability to manage health-related needs will improve Outcome: Not Progressing   Problem: Metabolic: Goal: Ability to maintain appropriate glucose levels will improve Outcome: Not Progressing   Problem: Nutritional: Goal: Maintenance of adequate nutrition will improve Outcome: Not Progressing Goal: Progress toward achieving an optimal weight will improve Outcome: Not Progressing   Problem: Skin Integrity: Goal: Risk for impaired skin integrity will decrease Outcome: Not Progressing   Problem: Tissue Perfusion: Goal: Adequacy of tissue perfusion will improve Outcome: Not Progressing

## 2023-06-23 NOTE — Death Summary Note (Signed)
 DEATH SUMMARY   Patient Details  Name: RIO TABER MRN: [id number] DOB: 1927/08/01  Admission/Discharge Information   Admit Date:  03-Jun-2023  Date of Death:  2023/06/07  Time of Death:  15:50  Length of Stay: 4  Referring Physician: Andi Jointer, DO   Reason(s) for Hospitalization  Severe Sepsis with Septic Shock Proteus Mirabilis Bacteremia  Right Ureteral Stone infection Acute Kidney Injury Anion Gap Metabolic Acidosis Lactic Acidosis Hypokalemia Hyponatremia Thrombocytopenia Transaminitis  Diabetes Mellitus Type II Hypothyroidism Acute Metabolic Encephalopathy  Diagnoses  Preliminary cause of death: Severe Sepsis with Septic Shock Secondary Diagnoses (including complications and co-morbidities):  Principal Problem:   Septic shock (HCC) Active Problems:   Hypothyroidism   Goals of care, counseling/discussion   Chronic heart failure with preserved ejection fraction (HFpEF) (HCC)   Acute unilateral obstructive uropathy   Type 2 diabetes mellitus with peripheral neuropathy (HCC)   AKI (acute kidney injury) (HCC)   Atrial fibrillation with RVR (HCC)   Acute encephalopathy   Shock liver   Demand ischemia (HCC)   Acute hypoxic respiratory failure (HCC)   Right ureteral stone   Brief Hospital Course (including significant findings, care, treatment, and services provided and events leading to death)  NEA GITTENS is a  89 y.o with significant PMH of CAD, d CHF, HTN, GERD, HLD, hypothyroidism, TIA, persistent atrial fibrillation on Eliquis , T2DM, frequent UTIs and IBS who presented to the to the hospital on 06/03/23 for elective definitive ureteroscopy.   Patient was recently admitted to the hospital from 01/25-01/13 with Acute Metabolic Encephalopathy in setting of Severe Sepsis due to  ESBL E. Coli BACTEREMIA due to Right Ureteral Stone infection, status post Cystoscopy and right ureteral stent placement complicated by shock.    Hospital Course:  Patient underwent ureteroscopy and stent exchange. She was transferred to  PACU and hospitalist consulted for admission.  Initial vital signs showed HR of 108 beats/minute, BP mm Hg, the RR 15 breaths/minute, and the oxygen saturation 77% on HFNC and a temperature of 98.30F (37C).  Pertinent Labs/Diagnostics Findings: Na+/ K+: 135/3.7 Glucose: 178 BUN/Cr.:20/1.46 AST/ALT:355/48 WBC: 47.4 K/L Hgb/Hct:10.5/31.3 Plts: 140 PCT: 122.61  Lactic acid: 5.9~7.0~5.6 troponin: 43  BNP: 256.8  CXR>see result below Patient given 30 cc/kg of fluids and started on broad-spectrum for urosepsis with shock. Patient remained hypotensive despite IVF boluses therefore was started on Levophed .    Please see Significant Hospital Events section below for full detailed hospital course.  Significant Hospital Events: Including procedures, antibiotic start and stop dates in addition to other pertinent events   06-03-2023: Admit to ICU with severe urosepsis with septic shock secondary to ureteral stone s/p stent placement  05/26/23- patient ripped out her central line while moving around.  This was discussed with family and replaced due to need for high dose levophed  and infusion of vasopressin .  She is clinically improved today and more awake more alert.  I met with son Velinda and granddaughter at bedside we reviewed bloodwork and imaging.   05/27/23- patient presistent on levophed /vasopressin .  Increased IVF and added solucortef this am.  05/28/23- Remains critically ill on Levophed  and Vasopressin . Remains encephalopathic.  AKI and metabolic acidosis slightly improved  Palliative Care following, family confirms DNR/DNI status and wishes to continue current measures and allow time for outcomes.  Speech evaluation pending. 06/07/2023- Pt is declining and in dying process. Escalating pressor requirements, worsening AKI and metabolic acidosis with oliguria, worsening hypoxia, now somnolent.  Family at bedside, transition to COMFORT  MEASURES.  Passed away later in the evening.  Pertinent Labs and Studies  Significant Diagnostic Studies DG Chest Port 1 View Result Date: 05/26/2023 CLINICAL DATA:  [id number] Encounter for central line placement [id number] EXAM: PORTABLE CHEST 1 VIEW COMPARISON:  05/25/2023 FINDINGS: Right internal jugular approach central venous catheter is been advanced with distal tip now terminating at the level of the right atrium. Cardiomegaly. Aortic atherosclerosis. Small bilateral pleural effusions with bibasilar atelectasis. No pneumothorax. IMPRESSION: 1. Right internal jugular approach central venous catheter has been advanced with distal tip now terminating at the level of the right atrium. No pneumothorax. 2. Small bilateral pleural effusions with bibasilar atelectasis. Electronically Signed   By: Mabel Converse D.O.   On: 05/26/2023 10:42   CT ABDOMEN PELVIS WO CONTRAST Result Date: 05/26/2023 CLINICAL DATA:  Abdominal/flank pain, history of urolithiasis EXAM: CT ABDOMEN AND PELVIS WITHOUT CONTRAST TECHNIQUE: Multidetector CT imaging of the abdomen and pelvis was performed following the standard protocol without IV contrast. RADIATION DOSE REDUCTION: This exam was performed according to the departmental dose-optimization program which includes automated exposure control, adjustment of the mA and/or kV according to patient size and/or use of iterative reconstruction technique. COMPARISON:  04/27/2023 FINDINGS: Lower chest: Small bilateral pleural effusions as before, with dependent atelectasis in the lung bases. Coronary and aortic calcifications. Cardiomegaly with biatrial enlargement. Hepatobiliary: No focal liver abnormality is seen. Status post cholecystectomy. No biliary dilatation. Pancreas: Diffuse parenchymal atrophy without ductal dilatation, mass, or regional inflammatory change. Spleen: Normal in size without focal abnormality. Adrenals/Urinary Tract: No adrenal mass. Bilateral urolithiasis, largest 2 mm  peripherally in the mid right renal collecting system. Right double-J ureteral stent in expected location, with persistent mild hydronephrosis, of somewhat high density suggesting possible hemorrhagic component. Urinary bladder decompressed by Foley catheter. Stomach/Bowel: Stomach is nondistended. Small bowel decompressed. Colon nondistended, unremarkable. Vascular/Lymphatic: Moderate scattered aortoiliac calcified plaque without aneurysm. No abdominal or pelvic adenopathy. Reproductive: Status post hysterectomy. No adnexal masses. Other: Surgical clips right pelvic sidewall and retroperitoneum. Small volume pelvic and left abdominal ascites with some streaky inflammatory/edematous changes in the mesentery, increased from previous. No free air. Musculoskeletal: Post cement augmentation of T12, L1, and L4 vertebral compression fractures. Old fracture deformity of the inferior sacrum. Fixation hardware across the left femoral IT fracture as before. IMPRESSION: 1. Right double-J ureteral stent in expected location, with persistent mild hydronephrosis, of somewhat high density suggesting possible hemorrhagic component. 2. Bilateral urolithiasis. 3. Small volume pelvic and left abdominal ascites with some streaky inflammatory/edematous changes in the mesentery, increased from previous. 4.  Aortic Atherosclerosis (ICD10-I70.0). Electronically Signed   By: JONETTA Faes M.D.   On: 05/26/2023 08:31   US  Venous Img Lower Bilateral (DVT) Result Date: 05/26/2023 CLINICAL DATA:  Bilateral lower extremity pain and edema. Evaluate for DVT. EXAM: BILATERAL LOWER EXTREMITY VENOUS DOPPLER ULTRASOUND TECHNIQUE: Gray-scale sonography with graded compression, as well as color Doppler and duplex ultrasound were performed to evaluate the lower extremity deep venous systems from the level of the common femoral vein and including the common femoral, femoral, profunda femoral, popliteal and calf veins including the posterior tibial,  peroneal and gastrocnemius veins when visible. The superficial great saphenous vein was also interrogated. Spectral Doppler was utilized to evaluate flow at rest and with distal augmentation maneuvers in the common femoral, femoral and popliteal veins. COMPARISON:  Bilateral lower extremity venous Doppler ultrasound-01/12/2022 (negative). FINDINGS: RIGHT LOWER EXTREMITY Common Femoral Vein: No evidence of thrombus. Normal compressibility, respiratory phasicity and response to augmentation. Saphenofemoral Junction: No  evidence of thrombus. Normal compressibility and flow on color Doppler imaging. Profunda Femoral Vein: No evidence of thrombus. Normal compressibility and flow on color Doppler imaging. Femoral Vein: No evidence of thrombus. Normal compressibility, respiratory phasicity and response to augmentation. Popliteal Vein: No evidence of thrombus. Normal compressibility, respiratory phasicity and response to augmentation. Calf Veins: No evidence of thrombus. Normal compressibility and flow on color Doppler imaging. Superficial Great Saphenous Vein: No evidence of thrombus. Normal compressibility. Other Findings:  None. LEFT LOWER EXTREMITY Common Femoral Vein: No evidence of thrombus. Normal compressibility, respiratory phasicity and response to augmentation. Saphenofemoral Junction: No evidence of thrombus. Normal compressibility and flow on color Doppler imaging. Profunda Femoral Vein: No evidence of thrombus. Normal compressibility and flow on color Doppler imaging. Femoral Vein: No evidence of thrombus. Normal compressibility, respiratory phasicity and response to augmentation. Popliteal Vein: No evidence of thrombus. Normal compressibility, respiratory phasicity and response to augmentation. Calf Veins: No evidence of thrombus. Normal compressibility and flow on color Doppler imaging. Superficial Great Saphenous Vein: No evidence of thrombus. Normal compressibility. Other Findings:  None. IMPRESSION: No  evidence of DVT within either lower extremity. Electronically Signed   By: Norleen Roulette M.D.   On: 05/26/2023 07:54   DG Chest 1 View Result Date: 05/25/2023 CLINICAL DATA:  Central line EXAM: CHEST  1 VIEW COMPARISON:  05/25/2023 FINDINGS: Right-sided central venous catheter with tip at the cavoatrial junction. Left shoulder replacement. Cardiomegaly with central congestion and suspected small left effusion. Mild left basilar airspace disease. Aortic atherosclerosis. No pneumothorax IMPRESSION: 1. Right-sided central venous catheter with tip at the cavoatrial junction. No pneumothorax. 2. Cardiomegaly with central congestion and suspected small left effusion. Mild left basilar airspace disease, atelectasis versus pneumonia. Electronically Signed   By: Luke Bun M.D.   On: 05/25/2023 21:53   DG Chest 1 View Result Date: 05/25/2023 CLINICAL DATA:  Short of breath, fever EXAM: CHEST  1 VIEW COMPARISON:  04/28/2023 FINDINGS: Single frontal view of the chest demonstrates an unremarkable cardiac silhouette. Small left pleural effusion and left basilar consolidation are unchanged since prior exam. No pneumothorax. No acute bony abnormalities. IMPRESSION: 1. Stable left basilar consolidation and small left effusion. Electronically Signed   By: Ozell Daring M.D.   On: 05/25/2023 17:20   DG OR UROLOGY CYSTO IMAGE ([facility] ONLY) Result Date: 05/25/2023 There is no interpretation for this exam.  This order is for images obtained during a surgical procedure.  Please See Surgeries Tab for more information regarding the procedure.    Microbiology Recent Results (from the past 240 hours)  Culture, blood (Routine X 2) w Reflex to ID Panel     Status: Abnormal   Collection Time: 05/25/23  4:29 PM   Specimen: BLOOD  Result Value Ref Range Status   Specimen Description   Final    BLOOD BLOOD RIGHT HAND Performed at [facility], [address] [place], [place] [address]    Special Requests   Final     BOTTLES DRAWN AEROBIC AND ANAEROBIC Blood Culture results may not be optimal due to an inadequate volume of blood received in culture bottles Performed at [facility], [address], [place], [place] [address]    Culture  Setup Time   Final    GRAM NEGATIVE RODS IN BOTH AEROBIC AND ANAEROBIC BOTTLES CRITICAL RESULT CALLED TO, READ BACK BY AND VERIFIED WITH: JASON ROBBINS AT 9384 05/26/23.PMF GRAM STAIN REVIEWED-AGREE WITH RESULT Performed at [facility], [address]  [place], [place] [address]    Culture PROTEUS MIRABILIS (A)  Final   Report Status 05/28/2023 FINAL  Final   Organism ID, Bacteria PROTEUS MIRABILIS  Final   Organism ID, Bacteria PROTEUS MIRABILIS  Final      Susceptibility   Proteus mirabilis - KIRBY BAUER*    CEFAZOLIN  INTERMEDIATE Intermediate    Proteus mirabilis - MIC*    AMPICILLIN <=2 SENSITIVE Sensitive     CEFEPIME <=0.12 SENSITIVE Sensitive     CEFTAZIDIME <=1 SENSITIVE Sensitive     CEFTRIAXONE  <=0.25 SENSITIVE Sensitive     CIPROFLOXACIN  >=4 RESISTANT Resistant     GENTAMICIN  <=1 SENSITIVE Sensitive     IMIPENEM 8 INTERMEDIATE Intermediate     TRIMETH/SULFA >=320 RESISTANT Resistant     AMPICILLIN/SULBACTAM <=2 SENSITIVE Sensitive     PIP/TAZO <=4 SENSITIVE Sensitive ug/mL    * PROTEUS MIRABILIS    PROTEUS MIRABILIS  Blood Culture ID Panel (Reflexed)     Status: Abnormal   Collection Time: 05/25/23  4:29 PM  Result Value Ref Range Status   Enterococcus faecalis NOT DETECTED NOT DETECTED Final   Enterococcus Faecium NOT DETECTED NOT DETECTED Final   Listeria monocytogenes NOT DETECTED NOT DETECTED Final   Staphylococcus species NOT DETECTED NOT DETECTED Final   Staphylococcus aureus (BCID) NOT DETECTED NOT DETECTED Final   Staphylococcus epidermidis NOT DETECTED NOT DETECTED Final   Staphylococcus lugdunensis NOT DETECTED NOT DETECTED Final   Streptococcus species NOT DETECTED NOT DETECTED Final   Streptococcus agalactiae NOT  DETECTED NOT DETECTED Final   Streptococcus pneumoniae NOT DETECTED NOT DETECTED Final   Streptococcus pyogenes NOT DETECTED NOT DETECTED Final   A.calcoaceticus-baumannii NOT DETECTED NOT DETECTED Final   Bacteroides fragilis NOT DETECTED NOT DETECTED Final   Enterobacterales DETECTED (A) NOT DETECTED Final    Comment: Enterobacterales represent a large order of gram negative bacteria, not a single organism. CRITICAL RESULT CALLED TO, READ BACK BY AND VERIFIED WITH: JASON ROBBINS AT 9384 05/26/23.PMF    Enterobacter cloacae complex NOT DETECTED NOT DETECTED Final   Escherichia coli NOT DETECTED NOT DETECTED Final   Klebsiella aerogenes NOT DETECTED NOT DETECTED Final   Klebsiella oxytoca NOT DETECTED NOT DETECTED Final   Klebsiella pneumoniae NOT DETECTED NOT DETECTED Final   Proteus species DETECTED (A) NOT DETECTED Final    Comment: CRITICAL RESULT CALLED TO, READ BACK BY AND VERIFIED WITH: JASON ROBBINS AT 9384 05/26/23.PMF    Salmonella species NOT DETECTED NOT DETECTED Final   Serratia marcescens NOT DETECTED NOT DETECTED Final   Haemophilus influenzae NOT DETECTED NOT DETECTED Final   Neisseria meningitidis NOT DETECTED NOT DETECTED Final   Pseudomonas aeruginosa NOT DETECTED NOT DETECTED Final   Stenotrophomonas maltophilia NOT DETECTED NOT DETECTED Final   Candida albicans NOT DETECTED NOT DETECTED Final   Candida auris NOT DETECTED NOT DETECTED Final   Candida glabrata NOT DETECTED NOT DETECTED Final   Candida krusei NOT DETECTED NOT DETECTED Final   Candida parapsilosis NOT DETECTED NOT DETECTED Final   Candida tropicalis NOT DETECTED NOT DETECTED Final   Cryptococcus neoformans/gattii NOT DETECTED NOT DETECTED Final   CTX-M ESBL NOT DETECTED NOT DETECTED Final   Carbapenem resistance IMP NOT DETECTED NOT DETECTED Final   Carbapenem resistance KPC NOT DETECTED NOT DETECTED Final   Carbapenem resistance NDM NOT DETECTED NOT DETECTED Final   Carbapenem resist OXA 48 LIKE NOT  DETECTED NOT DETECTED Final   Carbapenem resistance VIM NOT DETECTED NOT DETECTED Final    Comment: Performed at  [facility], [address], [place], [place] [address]  Culture, blood (Routine X 2) w Reflex to ID Panel     Status: Abnormal   Collection Time: 05/25/23  4:30 PM   Specimen: BLOOD  Result Value Ref Range Status   Specimen Description   Final    BLOOD BLOOD LEFT HAND Performed at [facility], [address] [place], [place] [address]    Special Requests   Final    BOTTLES DRAWN AEROBIC ONLY Blood Culture results may not be optimal due to an inadequate volume of blood received in culture bottles Performed at [facility], [address], [place], [place] [address]    Culture  Setup Time   Final    GRAM NEGATIVE RODS AEROBIC BOTTLE ONLY CRITICAL VALUE NOTED.  VALUE IS CONSISTENT WITH PREVIOUSLY REPORTED AND CALLED VALUE. GRAM STAIN REVIEWED-AGREE WITH RESULT    Culture (A)  Final    PROTEUS MIRABILIS SUSCEPTIBILITIES PERFORMED ON PREVIOUS CULTURE WITHIN THE LAST 5 DAYS. Performed at [facility], [address], [place], [place] [address]    Report Status 05/28/2023 FINAL  Final  Urine Culture (for pregnant, neutropenic or urologic patients or patients with an indwelling urinary catheter)     Status: Abnormal   Collection Time: 05/25/23  6:27 PM   Specimen: Urine, Catheterized  Result Value Ref Range Status   Specimen Description   Final    URINE, CATHETERIZED Performed at [facility], [address], [place], [place] [address]    Special Requests   Final    NONE Performed at [facility], [address], [place], [place] [address]    Culture (A)  Final    30,000 COLONIES/mL PROTEUS MIRABILIS 20,000 COLONIES/mL ENTEROCOCCUS FAECIUM    Report Status 05/28/2023 FINAL  Final   Organism ID, Bacteria PROTEUS MIRABILIS (A)  Final   Organism ID, Bacteria ENTEROCOCCUS FAECIUM (A)  Final      Susceptibility    Enterococcus faecium - MIC*    AMPICILLIN <=2 SENSITIVE Sensitive     NITROFURANTOIN  64 INTERMEDIATE Intermediate     VANCOMYCIN  <=0.5 SENSITIVE Sensitive     * 20,000 COLONIES/mL ENTEROCOCCUS FAECIUM   Proteus mirabilis - MIC*    AMPICILLIN <=2 SENSITIVE Sensitive     CEFAZOLIN  <=4 SENSITIVE Sensitive     CEFEPIME <=0.12 SENSITIVE Sensitive     CEFTRIAXONE  <=0.25 SENSITIVE Sensitive     CIPROFLOXACIN  >=4 RESISTANT Resistant     GENTAMICIN  <=1 SENSITIVE Sensitive     IMIPENEM 2 SENSITIVE Sensitive     NITROFURANTOIN  128 RESISTANT Resistant     TRIMETH/SULFA 40 SENSITIVE Sensitive     AMPICILLIN/SULBACTAM <=2 SENSITIVE Sensitive     PIP/TAZO <=4 SENSITIVE Sensitive ug/mL    * 30,000 COLONIES/mL PROTEUS MIRABILIS  MRSA Next Gen by PCR, Nasal     Status: None   Collection Time: 05/25/23  7:52 PM   Specimen: Nasal Mucosa; Nasal Swab  Result Value Ref Range Status   MRSA by PCR Next Gen NOT DETECTED NOT DETECTED Final    Comment: (NOTE) The GeneXpert MRSA Assay (FDA approved for NASAL specimens only), is one component of a comprehensive MRSA colonization surveillance program. It is not intended to diagnose MRSA infection nor to guide or monitor treatment for MRSA infections. Test performance is not FDA approved in patients less than 95 years old. Performed at [facility], [address] [place], [place] [address]     Lab Basic Metabolic  Panel: Recent Labs  Lab 05/25/23 2348 05/26/23 0505 05/27/23 0622 05/28/23 0428 06-21-2023 0342  NA 135 134* 135 135 138  K 3.7 3.8 4.3 4.4 4.9  CL 102 102 101 104 103  CO2 19* 18* 16* 18* 15*  GLUCOSE 178* 209* 106* 123* 175*  BUN 20 21 35* 46* 62*  CREATININE 1.46* 1.60* 2.24* 1.85* 2.20*  CALCIUM  7.3* 7.2* 7.2* 6.7* 6.7*  MG  --   --   --  1.7 2.2  PHOS  --   --   --  4.0 4.7*   Liver Function Tests: Recent Labs  Lab 05/25/23 1629 05/26/23 0505 05/27/23 0622  AST 355* 253* 136*  ALT 48* 47* 35  ALKPHOS 313* 242*  213*  BILITOT 2.3* 2.7* 1.4*  PROT 6.2* 5.4* 5.9*  ALBUMIN 2.2* 1.9* 2.1*   No results for input(s): LIPASE, AMYLASE in the last 168 hours. No results for input(s): AMMONIA in the last 168 hours. CBC: Recent Labs  Lab 05/25/23 1629 05/25/23 2348 05/26/23 0505 05/27/23 0622 05/28/23 0428 06-21-2023 0342  WBC 8.4 47.4* 60.9* 84.3* 65.9* 38.2*  NEUTROABS 7.8* 44.2*  --   --   --   --   HGB 10.9* 10.5* 11.1* 10.8* 10.7* 10.5*  HCT 32.3* 31.3* 33.5* 32.0* 30.7* 30.0*  MCV 97.6 99.1 101.2* 100.0 96.5 96.2  PLT 134* 140* 135* 108* 94* 78*   Cardiac Enzymes: No results for input(s): CKTOTAL, CKMB, CKMBINDEX, TROPONINI in the last 168 hours. Sepsis Labs: Recent Labs  Lab 05/25/23 1958 05/25/23 2253 05/25/23 2348 05/26/23 0229 05/26/23 0505 05/27/23 0622 05/28/23 0428 21-Jun-2023 0342 06-21-23 0921  PROCALCITON 122.61  --   --   --   --   --   --   --   --   WBC  --   --    < >  --  60.9* 84.3* 65.9* 38.2*  --   LATICACIDVEN 7.0* 5.6*  --  6.4* 6.5*  --   --   --  7.6*   < > = values in this interval not displayed.    Procedures/Operations  1/31: Cystoscopy, right diagnostic ureteroscopy, right retrograde pyelogram with intraoperative interpretation, right ureteral stent placement  1/31: Right internal jugular Central line placement 2/1: Right internal jugular central line replacement (initial line removed)       Inge Lecher, AGACNP-BC [facility] Prefer epic messenger for cross cover needs If after hours, please call E-link  Inge JONETTA Lecher 21-Jun-2023, 3:54 PM

## 2023-06-23 NOTE — TOC Transition Note (Signed)
 Transition of Care [facility]) - Discharge Note   Patient Details  Name: Jodi Reeves MRN: [id number] Date of Birth: December 12, 1927  Transition of [facility] CM/SW Contact:  Truc Winfree C Anella Nakata, RN Phone Number: 06-27-2023, 12:29 PM   Clinical Narrative:    Patient is now comfort care. Per MD is inpatient comfort care. Barnie at [facility] notified.    Final next level of care: Skilled Nursing Facility (LTC) Barriers to Discharge: Barriers Resolved   Patient Goals and CMS Choice            Discharge Placement              Patient chooses bed at: [facility] Patient to be transferred to facility by: [facility] Name of family member notified: Son, Velinda Patient and family notified of of transfer: 2023/06/27  Discharge Plan and Services Additional resources added to the After Visit Summary for                                       Social Drivers of Health (SDOH) Interventions SDOH Screenings   Food Insecurity: No Food Insecurity (05/25/2023)  Housing: Unknown (05/25/2023)  Transportation Needs: No Transportation Needs (05/25/2023)  Utilities: Not At Risk (05/25/2023)  Depression (PHQ2-9): Low Risk  (06/21/2021)  Financial Resource Strain: Low Risk  (05/21/2023)   Received from [facility]  Physical Activity: Insufficiently Active (06/18/2020)  Social Connections: Unknown (05/25/2023)  Stress: No Stress Concern Present (06/21/2021)  Tobacco Use: Low Risk  (05/25/2023)     Readmission Risk Interventions     No data to display

## 2023-06-23 NOTE — Progress Notes (Signed)
   Jun 23, 2023 1500  Spiritual Encounters  Type of Visit Initial  Care provided to: Pt and family  Referral source Nurse (RN/NT/LPN)  Reason for visit Routine spiritual support  OnCall Visit No  Spiritual Framework  Community/Connection Faith community;Family;Friend(s)  Interventions  Spiritual Care Interventions Made Established relationship of care and support;Compassionate presence;Prayer;Encouragement  Spiritual Care Plan  Spiritual Care Issues Still Outstanding Referring to oncoming chaplain for further support    Chaplain provided compassionate presence and prayed with the family.

## 2023-06-23 NOTE — Progress Notes (Addendum)
 0730 patient not alert responds to stimuli only unable to communicate and nonverbal today Heart rhythm now AFIB patient on amio gtt patient also still on vaso and levo. Patient 02 requirements have increased from 3L [place] to 10L [place] throughout the night. Breathing is labored. 0900 granddaughter at bedside update given to her and patient son that's on the phone. 1550 tome of death

## 2023-06-23 NOTE — [place] FL2 (Signed)
 [place]  MEDICAID [id number] LEVEL OF CARE FORM     IDENTIFICATION  Patient Name: Jodi Reeves Birthdate: 08-18-1927 Sex: female Admission Date (Current Location): 05/25/2023  [facility] [place] and [place] Number:  Chiropodist and Address:  [facility], [address] [place], [place] [address]      Provider Number: [id number]  Attending Physician Name and Address:  Isadora Hose, MD  Relative Name and Phone Number:  Shinika, Estelle)  [phone] [facility])    Current Level of Care: Hospital Recommended Level of Care: Skilled Nursing Facility Prior Approval Number:    Date Approved/Denied:   PASRR Number: [id number]  Discharge Plan: SNF    Current Diagnoses: Patient Active Problem List   Diagnosis Date Noted   Right ureteral stone 05/28/2023   Atrial fibrillation with RVR (HCC) 05/25/2023   Acute encephalopathy 05/25/2023   Shock liver 05/25/2023   Demand ischemia (HCC) 05/25/2023   Acute hypoxic respiratory failure (HCC) 05/25/2023   Kidney stone 04/30/2023   Septic shock (HCC) 04/26/2023   Type 2 diabetes mellitus with peripheral neuropathy (HCC) 04/26/2023   GERD without esophagitis 04/26/2023   Paroxysmal atrial fibrillation (HCC) 04/26/2023   AKI (acute kidney injury) (HCC) 04/26/2023   Hypokalemia 04/26/2023   E coli bacteremia 04/26/2023   Acute unilateral obstructive uropathy 04/25/2023   Closed intertrochanteric fracture of hip, left, initial encounter (HCC)    Acute blood loss anemia 10/11/2020   Thrombocytopenia (HCC) 10/11/2020   Closed left hip fracture (HCC) 10/10/2020   Failure to thrive in adult 04/19/2020   UTI (urinary tract infection) 04/10/2020   Abdominal pain 04/09/2020   Hypertension associated with diabetes (HCC) 03/15/2020   Obesity (BMI 30-39.9) 03/15/2020   Coronary artery disease involving native coronary artery of native heart without angina pectoris 03/15/2020   PAD (peripheral artery disease) (HCC)  08/14/2019   Foot ulcer (HCC) 06/19/2019   Lymphedema 06/09/2019   Chronic venous insufficiency 06/09/2019   Personal history of transient ischemic attack (TIA), and cerebral infarction without residual deficits 04/25/2019   Diabetic retinopathy associated with type 2 diabetes mellitus (HCC) 01/20/2019   Low back pain 09/24/2018   Overactive bladder 04/10/2018   History of UTI 04/10/2018   Hematuria 04/10/2018   Diarrhea 04/10/2018   Venous ulcer of leg (HCC) 04/10/2018   Status post endoscopic carpal tunnel release 03/19/2018   History of skin cancer 11/08/2017   Chronic anticoagulation 06/11/2017   Aortic atherosclerosis (HCC) 05/08/2017   Coronary artery disease, non-occlusive 03/29/2017   Persistent atrial fibrillation (HCC) 03/29/2017   Carpal tunnel syndrome 03/28/2017   Cervical radiculitis 03/28/2017   Problems with swallowing and mastication    Esophageal candidiasis (HCC)    Stricture and stenosis of esophagus 05/02/2016   TIA (transient ischemic attack) 06/24/2015   Status post reverse total shoulder replacement, left 10/16/2014   Humeral head fracture 10/06/2014   Chest pain 03/27/2014   Shortness of breath 03/27/2014   Chronic heart failure with preserved ejection fraction (HFpEF) (HCC) 03/27/2014   Goals of care, counseling/discussion 02/24/2014   Routine general medical examination at a health care facility 02/17/2013   Diabetes, polyneuropathy (HCC) 02/17/2013   Type 2 diabetes mellitus with diabetic polyneuropathy, without long-term current use of insulin  (HCC) 02/17/2013   Gait instability 04/11/2012   Edema 11/14/2010   OTHER CONSTIPATION 05/31/2007   Ventral hernia 03/15/2007   Ovarian cancer (HCC) 11/20/2006   Hypothyroidism 11/20/2006   Type 2 diabetes mellitus with neurological manifestations, controlled (HCC) 11/20/2006   Essential  hypertension 11/20/2006   Permanent atrial fibrillation (HCC) 11/20/2006   Osteoporosis 11/20/2006   Hyperlipidemia due  to type 2 diabetes mellitus (HCC) 11/20/2006    Orientation RESPIRATION BLADDER Height & Weight      (Disoriented x4)  O2 (028L) External catheter, Incontinent Weight: 78.7 kg Height:  5' 3 (160 cm)  BEHAVIORAL SYMPTOMS/MOOD NEUROLOGICAL BOWEL NUTRITION STATUS   (n/a)  (n/a) Incontinent  (NPO)  AMBULATORY STATUS COMMUNICATION OF NEEDS Skin   Total Care Verbally Bruising (Erythema/ redness to abdomen and right lower buttock, eechymosis left buttock)                       Personal Care Assistance Level of Assistance  Total care Bathing Assistance: Maximum assistance Feeding assistance: Maximum assistance Dressing Assistance: Maximum assistance Total Care Assistance: Maximum assistance   Functional Limitations Info  Sight Sight Info: Impaired        SPECIAL CARE FACTORS FREQUENCY                       Contractures Contractures Info: Not present    Additional Factors Info  Code Status, Allergies, Isolation Precautions Code Status Info: DNR Allergies Info: Amiodarone , Fosamax (Alendronate), Penicillins, Sulfa Antibiotics, Actos (Pioglitazone), Amaryl (Glimepiride), Atorvastatin, Bentyl  (Dicyclomine  Hcl), Ciprofloxacin , Codeine, Ezetimibe-simvastatin, Lovastatin, Macrobid  (Nitrofurantoin  Macrocrystal), Protonix  (Pantoprazole  Sodium), Rosiglitazone Maleate, Statins, Victoza (Liraglutide), Alendronate Sodium, Bactrim (Sulfamethoxazole-trimethoprim)     Isolation Precautions Info: ESBL, MRSA     Current Medications (05/30/23):  This is the current hospital active medication list Current Facility-Administered Medications  Medication Dose Route Frequency Provider Last Rate Last Admin   0.9 %  sodium chloride  infusion   Intravenous Continuous Keene, Jeremiah D, NP 20 mL/hr at 05/30/23 1143 New Bag at 2023/05/30 1143   acetaminophen  (TYLENOL ) tablet 650 mg  650 mg Oral Q6H PRN Keene, Jeremiah D, NP       Or   acetaminophen  (TYLENOL ) suppository 650 mg  650 mg Rectal Q6H  PRN Keene, Jeremiah D, NP       glycopyrrolate  (ROBINUL ) tablet 1 mg  1 mg Oral Q4H PRN Keene, Jeremiah D, NP       Or   glycopyrrolate  (ROBINUL ) injection 0.2 mg  0.2 mg Subcutaneous Q4H PRN Keene, Jeremiah D, NP       Or   glycopyrrolate  (ROBINUL ) injection 0.2 mg  0.2 mg Intravenous Q4H PRN Keene, Jeremiah D, NP       haloperidol  lactate (HALDOL ) injection 2.5-5 mg  2.5-5 mg Intravenous Q4H PRN Keene, Jeremiah D, NP       HYDROmorphone  (DILAUDID ) 50 mg in 50 mL NS (1mg /mL) premix infusion  0-4 mg/hr Intravenous Continuous Shellia Mann D, NP 1.5 mL/hr at 05/30/2023 1203 1.5 mg/hr at May 30, 2023 1203   HYDROmorphone  (DILAUDID ) bolus via infusion 1 mg  1 mg Intravenous Q15 min PRN Keene, Jeremiah D, NP   1 mg at 30-May-2023 1203   LORazepam  (ATIVAN ) injection 2-4 mg  2-4 mg Intravenous Q4H PRN Keene, Jeremiah D, NP   2 mg at 05/30/23 1202   ondansetron  (ZOFRAN ) tablet 4 mg  4 mg Oral Q6H PRN Arnett Saunders, MD       Or   ondansetron  (ZOFRAN ) injection 4 mg  4 mg Intravenous Q6H PRN Basaraba, Iulia, MD   4 mg at 05/26/23 1358   polyethylene glycol (MIRALAX  / GLYCOLAX ) packet 17 g  17 g Oral Daily PRN Basaraba, Iulia, MD       polyvinyl alcohol  (LIQUIFILM  TEARS) 1.4 % ophthalmic solution 1 drop  1 drop Both Eyes QID PRN Keene, Jeremiah D, NP       sodium chloride  flush (NS) 0.9 % injection 3 mL  3 mL Intravenous Q12H Arnett Saunders, MD   3 mL at 06/23/2023 9066     Discharge Medications: Please see discharge summary for a list of discharge medications.  Relevant Imaging Results:  Relevant Lab Results:   Additional Information SS #: [id number]  Daquana Paddock C Eneida Evers, RN

## 2023-06-23 NOTE — IPAL (Signed)
  Interdisciplinary Goals of Care Family Meeting   Date carried out: 27-Jun-2023  Location of the meeting: Bedside  Member's involved: Physician, Nurse Practitioner, Bedside Registered Nurse, and Family Member or next of kin  Durable Power of Attorney or acting medical decision maker: Pt's son Braylee Lal and his wife, along with Granddaughter   Discussion: We discussed goals of care for LOREL LEMBO .  We reviewed her clinical course and recurrent Bacteremia's with septic shock from infection kidney stones status post stent placement and exchange.  Overnight and today she has declined further with worsening shock, AKI and metabolic acidosis, worsening hypoxia, along with worsening encephalopathy to where she is minimally responsive.  She is suffering and in the dying process.  Given her current critical illness superimposed on multiple chronic co morbidities and advanced age, escalating care further would not ultimately change her outcomes or improve her quality of life, and would induce more suffering.  Family reports that she had a poor quality of life prior in the nursing home, and they acknowledge understanding that she is declining, suffering, and in the dying process.  They request that we TRANSITION TO COMFORT MEASURES.   Code status:   Code Status: Do not attempt resuscitation (DNR) - Comfort care   Disposition: In-patient comfort care  Time spent for the meeting: 15 minutes     Inge Lecher, AGACNP-BC [facility] Prefer epic messenger for cross cover needs If after hours, please call E-link  Inge JONETTA Lecher, NP  2023/06/27, 11:22 AM

## 2023-06-23 NOTE — Progress Notes (Signed)
Palliative Care Progress Note, Assessment & Plan   Patient Name: Jodi Reeves       Date: 06/22/2023 DOB: [birth year]  Age: 89 y.o. MRN#: [id number] Attending Physician: Raechel Chute, MD Primary Care Physician: Sherol Dade, DO Admit Date: 05/25/2023  Subjective: Patient and lying in bed in no apparent distress.  Respirations are even and unlabored.  She does not awaken or acknowledge my presence.  Her son Jodi Reeves and family members are at bedside along with patient's pastor.  HPI: 89 y.o. female  with past medical history of A> Fib on Eliquis, HFpEF, HTN, CAD, T2DM, hypothyroidism, ovarian cancer, pulmonary hypertension admitted from [facility] on 05/25/2023 for planned cystoscopy with stent exchange.  Required admission due to becoming hypoxic, tachycardic and less responsive in post op   Recent admit 1/1-1/13 for septic shock secondary to ESBL E. Coli bacteremia with obstructive uropathy c/p cystoscopy with stent placement   Palliative medicine was consulted for assisting with goals of care conversations.  Summary of counseling/coordination of care: Extensive chart review completed prior to meeting patient including labs, vital signs, imaging, progress notes, orders, and available advanced directive documents from current and previous encounters.   After reviewing the patient's chart and assessing the patient at bedside, I spoke with patient's family in regards to symptom management and goals of care.   Family shares that patient was in distress this morning.  However, after switching to comfort measures, she appears much more calm and out of suffering.  Discussed that full comfort measures will continue with medications being focused on how she is looking and her experience of her  end-of-life transition.  Therapeutic silence, active listening, and emotional support provided.  Current medication regimen is appropriately managing patient's symptoms.  No adjustment to [facility] needed.  Discussed use of nasal cannula for comfort and discussed that it is at a low amount that is not prolonging patient's life.  Should patient appear uncomfortable, her nose dry out/crack/bleed, then supplemental oxygen should be removed.  Patient's son shares concerns that patient will have to discharge away from the hospital.  Reviewed that patient will remain in the hospital with full comfort measures to continue until a safe discharge plan is made.  I discussed that patient is not medically stable to transfer anywhere at the moment.  We will continue to watch her, perhaps transfer to a MedSurg unit, and reevaluate every day if she is stable and appropriate to be transferred to a hospice inpatient facility.  It is not appropriate at this time to consider that as anticipated in-hospital death.  Family appreciated discussion and understands plan of care at this time.  Full comfort measures continue.  Physical Exam Vitals reviewed.  Constitutional:      General: She is not in acute distress. HENT:     Nose:     Comments: Drooping of the nasolabial folds    Mouth/Throat:     Mouth: Mucous membranes are dry.     Comments: Hyperextension of neck Cardiovascular:     Pulses: Normal pulses.  Pulmonary:     Effort: Pulmonary effort is normal.  Musculoskeletal:     Comments: Generalized weakness  Skin:    General: Skin  is warm and dry.     Coloration: Skin is pale.  Psychiatric:        Behavior: Behavior normal.             Total Time 35 minutes   Time spent includes: Detailed review of medical records (labs, imaging, vital signs), medically appropriate exam (mental status, respiratory, cardiac, skin), discussed with treatment team, counseling and educating patient, family and staff, documenting  clinical information, medication management and coordination of care.  Samara Deist L. Bonita Quin, DNP, FNP-BC Palliative Medicine Team

## 2023-06-23 NOTE — Progress Notes (Signed)
 NAME:  Jodi Reeves, MRN:  [id number], DOB:  12-Jan-1928, LOS: 4 ADMISSION DATE:  05/25/2023, CONSULTATION DATE:  05/25/2023 REFERRING MD:  Dr. Arnett, CHIEF COMPLAINT:  Septic Shock   Brief Pt Description / Synopsis:  89 y.o with recent PMHx of ESBL E. Coli Bacteremia in January 2025 from Right Ureteral Stone infection status post Right Ureteral Stent on 1/1, who is now admitted following Ureteroscopy and stent exchange on 1/31 with development of Severe Sepsis with Septic shock in the setting of Proteus Mirabilis BACTEREMIA complicated by Acute Metabolic Encephalopathy and Acute Kidney Injury.  History of Present Illness:   89 y.o with significant PMH of CAD, d CHF, HTN, GERD, HLD, hypothyroidism, TIA, persistent atrial fibrillation on Eliquis , T2DM, frequent UTIs and IBS who presented to the to the hospital on 05/25/2023 for elective definitive ureteroscopy.   Patient was recently admitted to the hospital from 01/25-01/13 with Acute Metabolic Encephalopathy in setting of Severe Sepsis due to  ESBL E. Coli BACTEREMIA due to Right Ureteral Stone infection, status post Cystoscopy and right ureteral stent placement complicated by shock.    Hospital Course: Patient underwent ureteroscopy and stent exchange. She was transferred to  PACU and hospitalist consulted for admission.  Initial vital signs showed HR of 108 beats/minute, BP mm Hg, the RR 15 breaths/minute, and the oxygen saturation 77% on HFNC and a temperature of 98.33F (37C).  Pertinent Labs/Diagnostics Findings: Na+/ K+: 135/3.7 Glucose: 178 BUN/Cr.:20/1.46 AST/ALT:355/48 WBC: 47.4 K/L Hgb/Hct:10.5/31.3 Plts: 140 PCT: 122.61  Lactic acid: 5.9~7.0~5.6 troponin: 43  BNP: 256.8  CXR>see result below Patient given 30 cc/kg of fluids and started on broad-spectrum for urosepsis with shock. Patient remained hypotensive despite IVF boluses therefore was started on Levophed .   Please see Significant Hospital Events section below for full  detailed hospital course.   Pertinent  Medical History  CAD, d CHF, HTN, GERD, HLD, hypothyroidism, TIA, persistent atrial fibrillation on Eliquis , T2DM, frequent UTIs and IBS   Micro Data:  1/31: Blood cultures>>PROTEUS MIRABILIS  1/31: Urine>>PROTEUS MIRABILIS & ENTEROCOCCUS FAECIUM  1/31: MRSA PCR>>negative  Antimicrobials:   Anti-infectives (From admission, onward)    Start     Dose/Rate Route Frequency Ordered Stop   05/26/23 2200  cefTRIAXone  (ROCEPHIN ) 2 g in sodium chloride  0.9 % 100 mL IVPB        2 g 200 mL/hr over 30 Minutes Intravenous Every 24 hours 05/26/23 1227     05/25/23 1800  meropenem  (MERREM ) 1 g in sodium chloride  0.9 % 100 mL IVPB  Status:  Discontinued        1 g 200 mL/hr over 30 Minutes Intravenous Every 12 hours 05/25/23 1626 05/26/23 1227   05/25/23 0954  ciprofloxacin  (CIPRO ) IVPB 400 mg        400 mg 200 mL/hr over 60 Minutes Intravenous 60 min pre-op 05/25/23 0954 05/25/23 1230   05/25/23 0000  nitrofurantoin  (MACRODANTIN ) 100 MG capsule        100 mg Oral Daily 05/25/23 1247 05/30/23 2359       Significant Hospital Events: Including procedures, antibiotic start and stop dates in addition to other pertinent events   05/25/23: Admit to ICU with severe urosepsis with septic shock secondary to ureteral stone s/p stent placement  05/26/23- patient ripped out her central line while moving around.  This was discussed with family and replaced due to need for high dose levophed  and infusion of vasopressin .  She is clinically improved today and more awake more alert.  I  met with son Velinda and granddaughter at bedside we reviewed bloodwork and imaging.   05/27/23- patient presistent on levophed /vasopressin .  Increased IVF and added solucortef this am.  05/28/23- Remains critically ill on Levophed  and Vasopressin . Remains encephalopathic.  AKI and metabolic acidosis slightly improved  Palliative Care following, family confirms DNR/DNI status and wishes to continue  current measures and allow time for outcomes.  Speech evaluation pending. 06-24-2023- Pt is declining and in dying process. Escalating pressor requirements, worsening AKI and metabolic acidosis with oliguria, worsening hypoxia, now somnolent.  Family at bedside, transition to COMFORT MEASURES.  Interim History / Subjective:  -As outlined above in significant hospital events section   Objective   Blood pressure (!) 123/100, pulse (!) 122, temperature 98.3 F (36.8 C), temperature source Axillary, resp. rate (!) 24, height 5' 3 (1.6 m), weight 78.7 kg, SpO2 93%. CVP:  [4 mmHg-27 mmHg] 8 mmHg      Intake/Output Summary (Last 24 hours) at 06/24/2023 0737 Last data filed at Jun 24, 2023 9271 Gross per 24 hour  Intake 1142.93 ml  Output 300 ml  Net 842.93 ml   Filed Weights   05/25/23 1011 05/25/23 1950  Weight: 82.3 kg 78.7 kg    Examination: General: Acute on chronically ill-appearing frail elderly female, laying in bed, restless, no acute distress HENT: Atraumatic, normocephalic, neck supple, no JVD Lungs: Coarse breath sounds throughout, even, nonlabored, normal effort Cardiovascular: Tachycardia, regular rhythm, S1-S2, no murmurs, rubs, gallops Abdomen: Soft, nontender, nondistended, no guarding or rebound tenderness, bowel sounds positive x 4 Extremities: Generalized weakness, normal bulk and tone, no deformities, trace edema bilateral lower extremities Neuro: Awake and alert, oriented only to self, moving all extremities but not command, no focal deficits noted, pupils PERRLA GU: Foley catheter in place  Resolved Hospital Problem list     Assessment & Plan:   #Septic Shock exacerbated by severe metabolic acidosis #Paroxsymal Atrial Fibrillation #Mildly elevated troponin, suspect demand ischemia Echocardiogram 04/27/23: LVEF 50-55%, indeterminate diastolic parameters, RV systolic function mildly reduced, moderately elevated pulmonary artery systolic pressure, moderate Tricuspid  regurgitation -Continuous cardiac monitoring -Maintain MAP >65 -Gentle IV fluids -Vasopressors as needed to maintain MAP goal -Continue stress dose steroids -Trend lactic acid until normalized -Trend HS Troponin until peaked (41 ~ 43) -Holding AC due to hematuria -Bicarb gtt  #Severe Sepsis #Proteus Mirabilis BACTEREMIA #Right Ureteral Stone infection ~ s/p Right Ureteral Stent on 1/1 and stent exchange on 01/31  Recent ESBL E. Coli BACTEREMIA on 01/25  -Monitor fever curve -Trend WBC's & Procalcitonin -Follow cultures as above -Continue empiric Ceftriaxone  pending cultures & sensitivities  #Acute Kidney Injury - KDIGO 3 - due to hydronephrosis with urolithiasis #AG Metabolic Acidosis #Lactic acidosis #Mild Hypokalemia  #Mild Hyponatremia  -Monitor I&O's / urinary output -Follow BMP -Ensure adequate renal perfusion -Avoid nephrotoxic agents as able -Replace electrolytes as indicated ~ Pharmacy following for assistance with electrolyte replacement -Start Bicarb gtt   #Anemia without s/sx of bleeding #Thrombocytopenia likely in the setting of severe sepsis -Monitor for S/Sx of bleeding -Trend CBC -SCD for VTE Prophylaxis (holding chemical ppx due to hematuria) -Transfuse for Hgb <7 -Transfuse Platelets for Platelet count <10 K, <50 K with active bleeding -DIC panel negative   #Transaminitis  suspect secondary to shock versus hepatic congestion. Chronically elevated alk phos elevation similar to presentation.  -Follow LFTs   #Diabetes Mellitus Type II #Hypothyroidism -CBG's q4h; Target range of 140 to 180 -SSI -Follow ICU Hypo/Hyperglycemia protocol -Continue Synthroid    #Acute Metabolic Encephalopathy  -  Treatment of sepsis and metabolic derangements as outlined above -Provide supportive care -Promote normal sleep/wake cycle and family presence -Avoid sedating medications as able     Patient is critically ill with severe septic shock with bacteremia and  multiorgan failure.  Prognosis is extremely guarded, high risk for further decompensation, cardiac arrest and death.  Given current critical illness superimposed on advanced age and multiple chronic comorbidities, overall long-term prognosis is extremely poor.  Patient is DNR/DNI, recommend consideration for transition to comfort measures.  Palliative care is following for ongoing goals of care conversations.   Best Practice (right click and Reselect all SmartList Selections daily)   Diet/type: NPO DVT prophylaxis: SCD GI prophylaxis: N/A Lines: Central line and yes and it is still needed Foley:  Yes, and it is still needed Code Status:  DNR Last date of multidisciplinary goals of care discussion [2/4]  2/4: Pt's son and his wife, and granddaughter updated at bedside.  Transitioning to comfort measures.  See IPAL note for full discussion details.  Labs   CBC: Recent Labs  Lab 05/25/23 1629 05/25/23 2348 05/26/23 0505 05/27/23 0622 05/28/23 0428 06-01-2023 0342  WBC 8.4 47.4* 60.9* 84.3* 65.9* 38.2*  NEUTROABS 7.8* 44.2*  --   --   --   --   HGB 10.9* 10.5* 11.1* 10.8* 10.7* 10.5*  HCT 32.3* 31.3* 33.5* 32.0* 30.7* 30.0*  MCV 97.6 99.1 101.2* 100.0 96.5 96.2  PLT 134* 140* 135* 108* 94* 78*    Basic Metabolic Panel: Recent Labs  Lab 05/25/23 2348 05/26/23 0505 05/27/23 0622 05/28/23 0428 06-01-23 0342  NA 135 134* 135 135 138  K 3.7 3.8 4.3 4.4 4.9  CL 102 102 101 104 103  CO2 19* 18* 16* 18* 15*  GLUCOSE 178* 209* 106* 123* 175*  BUN 20 21 35* 46* 62*  CREATININE 1.46* 1.60* 2.24* 1.85* 2.20*  CALCIUM  7.3* 7.2* 7.2* 6.7* 6.7*  MG  --   --   --  1.7 2.2  PHOS  --   --   --  4.0 4.7*   GFR: Estimated Creatinine Clearance: 15.9 mL/min (A) (by C-G formula based on SCr of 2.2 mg/dL (H)). Recent Labs  Lab 05/25/23 1958 05/25/23 2253 05/25/23 2348 05/26/23 0229 05/26/23 0505 05/27/23 0622 05/28/23 0428 June 01, 2023 0342  PROCALCITON 122.61  --   --   --   --   --    --   --   WBC  --   --    < >  --  60.9* 84.3* 65.9* 38.2*  LATICACIDVEN 7.0* 5.6*  --  6.4* 6.5*  --   --   --    < > = values in this interval not displayed.    Liver Function Tests: Recent Labs  Lab 05/25/23 1629 05/26/23 0505 05/27/23 0622  AST 355* 253* 136*  ALT 48* 47* 35  ALKPHOS 313* 242* 213*  BILITOT 2.3* 2.7* 1.4*  PROT 6.2* 5.4* 5.9*  ALBUMIN 2.2* 1.9* 2.1*   No results for input(s): LIPASE, AMYLASE in the last 168 hours. No results for input(s): AMMONIA in the last 168 hours.  ABG    Component Value Date/Time   PHART 7.39 04/26/2023 2028   PCO2ART 24 (L) 04/26/2023 2028   PO2ART 73 (L) 04/26/2023 2028   HCO3 14.5 (L) 04/26/2023 2028   ACIDBASEDEF 8.6 (H) 04/26/2023 2028   O2SAT 97.8 04/26/2023 2028     Coagulation Profile: Recent Labs  Lab 05/26/23 0246  INR 3.9*  Cardiac Enzymes: No results for input(s): CKTOTAL, CKMB, CKMBINDEX, TROPONINI in the last 168 hours.  HbA1C: Hgb A1c MFr Bld  Date/Time Value Ref Range Status  04/26/2023 05:06 AM 8.0 (H) 4.8 - 5.6 % Final    Comment:    (NOTE)         Prediabetes: 5.7 - 6.4         Diabetes: >6.4         Glycemic control for adults with diabetes: <7.0   10/10/2020 08:10 AM 7.8 (H) 4.8 - 5.6 % Final    Comment:    (NOTE)         Prediabetes: 5.7 - 6.4         Diabetes: >6.4         Glycemic control for adults with diabetes: <7.0     CBG: Recent Labs  Lab 05/28/23 1528 05/28/23 1926 05/28/23 2203 05/28/23 2307 06-15-23 0322  GLUCAP 103* 120* 138* 146* 140*    Review of Systems:   Unable to assess due to AMS   Past Medical History:  She,  has a past medical history of (HFpEF) heart failure with preserved ejection fraction (HCC), Allergy, Anxiety, Aortic atherosclerosis (HCC), B12 deficiency, Balance disorder, Cardiomegaly, Colon polyps, Coronary artery disease, non-occlusive, DDD (degenerative disc disease), lumbar, DM2 (diabetes mellitus, type 2) (HCC), Dysphagia,  Esophageal obstruction, Esophageal web, Falls frequently, GERD (gastroesophageal reflux disease), Granuloma annulare (2010), History of chicken pox, History of eating disorder, HLD (hyperlipidemia), HTN (hypertension), Hypothyroidism, Incontinence of bowel, Insomnia, Irritable bowel syndrome with diarrhea, Lymphedema of both lower extremities, Nephrolithiasis, On apixaban  therapy, Osteopenia, Osteoporosis, Ovarian cancer (HCC) (1980), Persistent atrial fibrillation (HCC) (2005), Poor memory, Pulmonary hypertension (HCC), SBO (small bowel obstruction) (HCC) (1998), Scoliosis, TIA (transient ischemic attack), Venous insufficiency, and Ventral hernia.   Surgical History:   Past Surgical History:  Procedure Laterality Date   2nd look laparotomy  1982   ABDOMINAL HYSTERECTOMY     for ovarian cancer s/p hysterectomy total 1976 and exp lap 1980   APPENDECTOMY     CARDIAC CATHETERIZATION  11/2003   neg; a fib found    CARPAL TUNNEL RELEASE Left 03/06/2018   Procedure: CARPAL TUNNEL RELEASE ENDOSCOPIC;  Surgeon: Edie Norleen PARAS, MD;  Location: [facility];  Service: Orthopedics;  Laterality: Left;  diabetic - oral meds   CARPAL TUNNEL RELEASE Left    Procedure: CARPAL TUNNEL RELEASE; Location: [facility]; Surgeon: Norleen Edie, MD   CATARACT EXTRACTION Right 08/2005   cataract OD  2002   CHOLECYSTECTOMY  1986   CYSTOSCOPY WITH RETROGRADE PYELOGRAM, URETEROSCOPY AND STENT PLACEMENT Right 04/25/2023   Procedure: CYSTOSCOPY WITH RETROGRADE PYELOGRAM, URETEROSCOPY AND STENT PLACEMENT;  Surgeon: Selma Donnice SAUNDERS, MD;  Location: [facility];  Service: Urology;  Laterality: Right;   CYSTOSCOPY/URETEROSCOPY/HOLMIUM LASER/STENT PLACEMENT Right 05/25/2023   Procedure: CYSTOSCOPY/URETEROSCOPY/STENT EXCHANGE;  Surgeon: Francisca Redell BROCKS, MD;  Location: [facility];  Service: Urology;  Laterality: Right;   DEXA  9/04 and 1/02   EGD/dilation/colon  04/2004   ESOPHAGOGASTRODUODENOSCOPY (EGD) WITH PROPOFOL  N/A 05/16/2016    Procedure: ESOPHAGOGASTRODUODENOSCOPY (EGD) WITH PROPOFOL  with dilation;  Surgeon: Rogelia Copping, MD;  Location: [facility];  Service: Endoscopy;  Laterality: N/A;   FEMORAL HERNIA REPAIR Right 1958   fracture L elbow/wrist  2000   INTRAMEDULLARY (IM) NAIL INTERTROCHANTERIC Left 10/12/2020   Procedure: INTRAMEDULLARY (IM) NAIL INTERTROCHANTRIC;  Surgeon: Krasinski, Kevin, MD;  Location: [facility];  Service: Orthopedics;  Laterality: Left;   intussception/obstruction  03/1997   kidney  stone x2  1991   KYPHOPLASTY N/A 04/13/2020   Procedure: [id number];  Surgeon: Kathlynn Sharper, MD;  Location: [facility];  Service: Orthopedics;  Laterality: N/A;   KYPHOPLASTY N/A 05/18/2020   Procedure: L1 KYPHOPLASTY;  Surgeon: Kathlynn Sharper, MD;  Location: [facility];  Service: Orthopedics;  Laterality: N/A;   KYPHOPLASTY N/A 07/06/2020   Procedure: L4  KYPHOPLASTY;  Surgeon: Kathlynn Sharper, MD;  Location: [facility];  Service: Orthopedics;  Laterality: N/A;   laminectomy L4-5  1971   LUMBAR LAMINECTOMY     1970 ruptured disc    MOUTH SURGERY     myoview  stress  11/2003   syncope (-)   REVERSE SHOULDER ARTHROPLASTY Left 10/06/2014   Procedure: REVERSE SHOULDER ARTHROPLASTY;  Surgeon: Norleen JINNY Maltos, MD;  Location: [facility];  Service: Orthopedics;  Laterality: Left;   stillbirth  1969   TOTAL ABDOMINAL HYSTERECTOMY W/ BILATERAL SALPINGOOPHORECTOMY  1980   ovarian cancer   TOTAL SHOULDER ARTHROPLASTY Left    WRIST FRACTURE SURGERY Right 02/2004     Social History:   reports that she has never smoked. She has never used smokeless tobacco. She reports that she does not drink alcohol  and does not use drugs.   Family History:  Her family history includes Arthritis in her brother, brother, and sister; COPD in her brother; Cancer in her brother, brother, and brother; Depression in her brother and sister; Diabetes in her brother, mother, and paternal grandfather; Early death in her brother, brother, and father;  Hearing loss in her brother, brother, brother, daughter, sister, and sister; Heart disease in her brother, mother, sister, sister, and sister; Hyperlipidemia in her brother; Hypertension in her daughter and sister; Stroke in her sister.   Allergies Allergies  Allergen Reactions   Amiodarone  Other (See Comments)    Severe Thyroid  issues    Fosamax [Alendronate]     dysphagia   Penicillins Anaphylaxis and Other (See Comments)    Tolerated 1st generation cephalosporin (CEFAZOLIN ) 07/06/2020, 10/12/2020, 04/25/2023)   Sulfa Antibiotics Itching and Rash    Itching    Actos [Pioglitazone]     Not effective    Amaryl [Glimepiride]     Not effective     Atorvastatin Other (See Comments)    Muscle aches & All statins per Patient   Bentyl  [Dicyclomine  Hcl]     Could not tolerate nausea, reduced concentration, h/a    Ciprofloxacin  Other (See Comments)    High blood pressure     Codeine Nausea And Vomiting   Ezetimibe-Simvastatin Other (See Comments)    Muscle aches, nausea, back pain    Lovastatin Other (See Comments)    Myalgias   Macrobid  [Nitrofurantoin  Macrocrystal]     Severe Itching, rash    Protonix  [Pantoprazole  Sodium]     Esophageal problems  Wants removed from list   Rosiglitazone Maleate Other (See Comments)    Edema   Statins     Muscle and joint aches to all statins   Victoza [Liraglutide]     Nausea    Alendronate Sodium Rash    Dysphagia and ulceration    Bactrim [Sulfamethoxazole-Trimethoprim] Rash    itching     Home Medications  Prior to Admission medications   Medication Sig Start Date End Date Taking? Authorizing Provider  acetaminophen  (TYLENOL ) 500 MG tablet Take 1,000 mg by mouth every 6 (six) hours as needed for moderate pain (pain score 4-6).   Yes [provider]  alum & mag hydroxide-simeth (MAALOX/MYLANTA) 200-200-20 MG/5ML suspension Take  30 mLs by mouth every 4 (four) hours as needed for indigestion. 10/20/20  Yes Fausto Sor A,  DO  apixaban  (ELIQUIS ) 5 MG TABS tablet Take 5 mg by mouth 2 (two) times daily.   Yes [provider]  Ascorbic Acid  (VITAMIN C ) 1000 MG tablet Take 2,000 mg by mouth daily.   Yes [provider]  bisoprolol  (ZEBETA ) 5 MG tablet Take 5 mg by mouth every 12 (twelve) hours.   Yes [provider]  CALCIUM  ANTACID EXTRA STRENGTH 750 MG chewable tablet Chew 2 tablets by mouth daily as needed (indigestion). 08/22/21  Yes [provider]  Cyanocobalamin  (VITAMIN B-12) 5000 MCG LOZG Take 5,000 mcg by mouth daily.   Yes [provider]  diclofenac Sodium (VOLTAREN) 1 % GEL Apply 2 g topically in the morning, at noon, and at bedtime.   Yes [provider]  docusate sodium  (COLACE) 100 MG capsule Take 100 mg by mouth daily as needed for mild constipation.   Yes [provider]  estradiol  (ESTRACE ) 0.1 MG/GM vaginal cream APPLY 0.5MG  (PEA SIZED AMOUNT) JUST INSIDE THE VAGINA WITH FINGER-TIP ON MONDAY, WEDNESDAY AND FRIDAY NIGHTS. 07/21/20  Yes McGowan, Clotilda A, PA-C  famotidine  (PEPCID ) 20 MG tablet Take 20 mg by mouth at bedtime. 12/29/21  Yes [provider]  fluticasone  (FLONASE ) 50 MCG/ACT nasal spray Place 1 spray into both nostrils daily.   Yes [provider]  folic acid  (FOLVITE ) 1 MG tablet Take 1 tablet (1 mg total) by mouth daily. 10/21/20  Yes Fausto Sor A, DO  furosemide  (LASIX ) 20 MG tablet Take 1 tablet (20 mg total) by mouth daily. 05/07/23  Yes Darci Pore, MD  hydrOXYzine  (ATARAX ) 25 MG tablet Take 25 mg by mouth every 8 (eight) hours as needed for anxiety.   Yes [provider]  insulin  aspart (NOVOLOG ) 100 UNIT/ML injection Inject 0-15 Units into the skin 4 (four) times daily -  before meals and at bedtime. Patient taking differently: Inject 0-12 Units into the skin 2 (two) times daily. SS:200-249=2u, 250-299=4u, 300-349=6u, 350-399=8u, 400-449=10u, 450-499=12u, if 500 or greater= CALL MD  10/20/20  Yes Fausto Sor A, DO  insulin  glargine-yfgn (SEMGLEE , YFGN,) 100 UNIT/ML injection Inject 8 Units into the skin at bedtime.   Yes [provider]  levothyroxine  (SYNTHROID , LEVOTHROID) 50 MCG tablet Take 50 mcg by mouth daily before breakfast.   Yes [provider]  Magnesium  Oxide 250 MG TABS Take 250 mg by mouth daily.   Yes [provider]  melatonin 5 MG TABS Take 1 tablet (5 mg total) by mouth at bedtime as needed. 05/07/23  Yes Darci Pore, MD  Multiple Vitamins-Iron (DAILY MULTIVITAMINS/IRON PO) Take 1 tablet by mouth daily.   Yes [provider]  Multiple Vitamins-Minerals (HEALTHY EYES SUPERVISION 2) CAPS Take 1 capsule by mouth 2 (two) times daily.   Yes [provider]  nitrofurantoin  (MACRODANTIN ) 100 MG capsule Take 1 capsule (100 mg total) by mouth daily for 5 days. 05/25/23 05/30/23 Yes Francisca Redell BROCKS, MD  nystatin powder Apply 1 Application topically every 8 (eight) hours as needed (under the breasts for rash).   Yes [provider]  Olopatadine  HCl 0.2 % SOLN Place 1 drop into both eyes daily as needed (Allergies).   Yes [provider]  Omega-3 Fatty Acids (SUPER OMEGA 3 EPA/DHA) 1000 MG CAPS Take 1,000 mg by mouth in the morning and at bedtime.   Yes [provider]  ondansetron  (ZOFRAN ) 4 MG  tablet Take 1 tablet (4 mg total) by mouth every 6 (six) hours as needed for nausea. 10/20/20  Yes Fausto Sor A, DO  pyridOXINE  (B-6) 50 MG tablet Take 1 tablet (50 mg total) by mouth daily. 10/21/20  Yes Fausto Sor LABOR, DO  simethicone (MYLICON) 80 MG chewable tablet Chew 80 mg by mouth every 6 (six) hours as needed for flatulence.   Yes [provider]  TRADJENTA  5 MG TABS tablet Take 5 mg by mouth daily. 10/05/20  Yes [provider]  glucose blood (ONE TOUCH ULTRA TEST) test strip Use to test blood sugar once daily E11.49 06/24/15   Bedsole, Amy E, MD  magnesium  hydroxide  (MILK OF MAGNESIA) 400 MG/5ML suspension Take 30 mLs by mouth daily as needed for mild constipation. 10/20/20   Fausto Sor LABOR, DO  Nystatin (GERHARDT'S BUTT CREAM) CREA Apply 1 Application topically 2 (two) times daily. 05/07/23   Darci Pore, MD  Skin Protectants, Misc. (DIMETHICONE-ZINC OXIDE) cream Apply topically 2 (two) times daily as needed for dry skin. Please label the bottle for the vaginal area only. Patient not taking: Reported on 04/25/2023 06/29/20   Helon Clotilda LABOR, PA-C     Critical care time: 40 minutes     Inge Lecher, AGACNP-BC [facility] Prefer epic messenger for cross cover needs If after hours, please call E-link

## 2023-06-23 NOTE — Consult Note (Signed)
 PHARMACY CONSULT NOTE  Pharmacy Consult for Electrolyte Monitoring and Replacement   Recent Labs: Potassium (mmol/L)  Date Value  17-Jun-2023 4.9   Magnesium  (mg/dL)  Date Value  97/95/7974 2.2   Calcium  (mg/dL)  Date Value  97/95/7974 6.7 (L)   Albumin (g/dL)  Date Value  97/97/7974 2.1 (L)  09/26/2019 4.3   Phosphorus (mg/dL)  Date Value  97/95/7974 4.7 (H)   Sodium (mmol/L)  Date Value  06-17-23 138  09/14/2020 139   Assessment: Patient was admitted for urologic procedure. Pharmacy consulted for electrolytes.   Goal of Therapy:  WNL  Plan:  --No electrolyte replacement indicated at this time --F/u labs tomorrow AM  Marolyn KATHEE Mare 2023/06/17 7:45 AM

## 2023-06-23 DEATH — deceased
